# Patient Record
Sex: Male | Born: 1950 | Race: White | Hispanic: No | Marital: Single | Smoking: Former smoker
Health system: Southern US, Community
[De-identification: ages and names within clinical notes are randomized; demographics above are authoritative.]

## PROBLEM LIST (undated history)

## (undated) DIAGNOSIS — I219 Acute myocardial infarction, unspecified: Secondary | ICD-10-CM

## (undated) DIAGNOSIS — M51369 Other intervertebral disc degeneration, lumbar region without mention of lumbar back pain or lower extremity pain: Secondary | ICD-10-CM

## (undated) DIAGNOSIS — C801 Malignant (primary) neoplasm, unspecified: Secondary | ICD-10-CM

## (undated) DIAGNOSIS — M5126 Other intervertebral disc displacement, lumbar region: Secondary | ICD-10-CM

## (undated) DIAGNOSIS — M5136 Other intervertebral disc degeneration, lumbar region: Secondary | ICD-10-CM

## (undated) HISTORY — PX: CARDIAC CATHETERIZATION: SHX172

## (undated) HISTORY — DX: Other intervertebral disc degeneration, lumbar region: M51.36

## (undated) HISTORY — DX: Other intervertebral disc displacement, lumbar region: M51.26

## (undated) HISTORY — PX: KNEE SURGERY: SHX244

## (undated) HISTORY — DX: Other intervertebral disc degeneration, lumbar region without mention of lumbar back pain or lower extremity pain: M51.369

## (undated) NOTE — *Deleted (*Deleted)
[facility]  [address], [address] [place], [place] [address] Phone: [phone]  Fax: [phone]   Clinic Day:  05/02/2020   Referring physician: Marjie Skiff, NP  Chief Complaint: Jimmy West is a 73 y.o. male with metastatic adenocarcinoma of the lung who is seen for assessment prior to cycle #18 Alimta and pembrolizumab.   HPI: The patient was last seen in the medical oncology clinic on 04/11/2020. At that time, he felt "alright".  He wished to continue chemotherapy and pursue liquid biopsy testing.  Exam was stable. Hematocrit was 31.9, hemoglobin 9.8, MCV 93.0, platelets 453,000, WBC 11,500 (ANC 10,500). Calcium was 8.6. Albumin was 3.4. Creatinine was 1.24 (CrCl 59 ml/min). TSH was 0.690. Magnesium was 2.0. He received cycle #18 Alimta and pembrolizumab and Fulphila on 04/12/2020.  Liquid biopsy revealed no actionable mutation.  The tumor mutational burden was 1 Muts/Mb.  MSI high was not detected.  CHEK2 G 69*,  KRAS G12D, TP53 G245S.  He was contacted regarding these results.  Decision was made to pursue tissue by bronchoscopy or CT guidance.  He declined biopsy and wished to pursue CT guided biopsy.  CT guided biopsy is scheduled for x/x/2021.  During the interim, ***   Past Medical History:  Diagnosis Date  . Bulging lumbar disc   . Cancer (HCC)    stage 4 lung cancer  . Heart attack [facility])     Past Surgical History:  Procedure Laterality Date  . CARDIAC CATHETERIZATION     two stents  . KNEE SURGERY Left   . PORTA CATH INSERTION N/A 05/21/2018   Procedure: PORTA CATH INSERTION;  Surgeon: Annice Needy, MD;  Location: [facility] INVASIVE CV LAB;  Service: Cardiovascular;  Laterality: N/A;    Family History  Problem Relation Age of Onset  . Heart failure Father   . Cancer Maternal Aunt   . Heart failure Maternal Uncle   . Dementia Paternal Grandmother   . Cancer Maternal Aunt     Social History:  reports that he quit smoking  about 16 years ago. His smoking use included cigarettes. He has a 22.50 pack-year smoking history. He has never used smokeless tobacco. He reports previous alcohol use. He reports that he does not use drugs. He started smoking at age 45. He is smoking 1 1/2 packs/day. Patient denies known exposures to radiation ortoxins. 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 He has not been working recently as the salon was closed. He plays golf occasionally (none recently).He has 8 cats. The patient is alone ***today.   Allergies: No Known Allergies  Current Medications: Current Outpatient Medications  Medication Sig Dispense Refill  . acetaminophen (TYLENOL) 500 MG tablet Take 500 mg by mouth 3 (three) times daily as needed.  (Patient not taking: Reported on 04/11/2020)    . albuterol (VENTOLIN HFA) 108 (90 Base) MCG/ACT inhaler Inhale 2 puffs into the lungs every 6 (six) hours as needed for wheezing or shortness of breath. (Patient not taking: Reported on 04/11/2020) 18 g 3  . ALPRAZolam (XANAX) 0.5 MG tablet Take 1 tablet (0.5 mg total) by mouth 4 (four) times daily as needed for anxiety. 60 tablet 0  . Calcium 600-400 MG-UNIT CHEW Chew 2 tablets by mouth daily.    Marland Kitchen dexamethasone (DECADRON) 4 MG tablet Take 1 tab two times a day the day before Alimta chemo, then take 2 tabs once a day for 3 days starting the day after chemo.  30 tablet 1  . ELIQUIS 5 MG TABS tablet TAKE 1 TABLET BY MOUTH TWICE DAILY 60 tablet 5  . FLUoxetine (PROZAC) 40 MG capsule Take 1 capsule (40 mg total) by mouth daily. 30 capsule 3  . folic acid (FOLVITE) 1 MG tablet Take 1 tablet (1 mg total) by mouth daily. Continue until 21 days after Alimta completed. 100 tablet 3  . ondansetron (ZOFRAN-ODT) 8 MG disintegrating tablet DISSOLVE 1 TABLET ON TONGUE EVERY 8 HOURS AS NEEDED FOR NAUSEA OR VOMITING 20 tablet 1  . Oxycodone HCl 20 MG TABS TAKE 1/2 TO 1 TABLET BY MOUTH EVERY 4 HOURS AS NEEDED FOR SEVERE PAIN 60  tablet 0  . polyethylene glycol (MIRALAX / GLYCOLAX) packet Take 17 g by mouth daily.    Marland Kitchen senna (SENOKOT) 8.6 MG tablet Take 1 tablet by mouth as needed.      No current facility-administered medications for this visit.   Facility-Administered Medications Ordered in Other Visits  Medication Dose Route Frequency Provider Last Rate Last Admin  . 0.9 %  sodium chloride infusion   Intravenous Once Corcoran, Melissa C, MD      . 0.9 %  sodium chloride infusion   Intravenous Once PRN Corcoran, Melissa C, MD      . albuterol (PROVENTIL) (2.5 MG/3ML) 0.083% nebulizer solution 2.5 mg  2.5 mg Nebulization Once PRN Corcoran, Melissa C, MD      . alteplase (CATHFLO ACTIVASE) injection 2 mg  2 mg Intracatheter Once PRN Rosey Bath, MD      . EPINEPHrine (ADRENALIN) 1 MG/10ML injection 0.25 mg  0.25 mg Intravenous Once PRN Corcoran, Melissa C, MD      . EPINEPHrine (ADRENALIN) 1 MG/10ML injection 0.25 mg  0.25 mg Intravenous Once PRN Corcoran, Melissa C, MD      . heparin lock flush 100 unit/mL  500 Units Intracatheter Once PRN Merlene Pulling, Melissa C, MD      . heparin lock flush 100 unit/mL  250 Units Intracatheter Once PRN Nelva Nay C, MD      . sodium chloride flush (NS) 0.9 % injection 10 mL  10 mL Intravenous PRN Nelva Nay C, MD   10 mL at 03/04/19 0959  . sodium chloride flush (NS) 0.9 % injection 10 mL  10 mL Intracatheter Once PRN Corcoran, Melissa C, MD      . sodium chloride flush (NS) 0.9 % injection 3 mL  3 mL Intracatheter Once PRN Rosey Bath, MD        Review of Systems  Constitutional: Positive for weight loss (4 lbs). Negative for chills, diaphoresis, fever and malaise/fatigue.       Feels "alright".  HENT: Negative.  Negative for congestion, ear discharge, ear pain, hearing loss, nosebleeds, sinus pain, sore throat and tinnitus.   Eyes: Negative.  Negative for blurred vision and double vision.  Respiratory: Positive for cough and shortness of breath (on  exertion). Negative for hemoptysis and sputum production.   Cardiovascular: Positive for chest pain (when he takes deep breaths, occasional). Negative for palpitations, orthopnea and leg swelling.  Gastrointestinal: Negative for abdominal pain, blood in stool, constipation, diarrhea, heartburn, melena, nausea and vomiting.       Snacking more.  Genitourinary: Negative for dysuria, frequency and urgency.  Musculoskeletal: Positive for back pain (mild). Negative for falls, joint pain, myalgias and neck pain.  Skin: Negative.  Negative for itching and rash.  Neurological: Negative for dizziness, tingling, sensory change, focal weakness, weakness and headaches.  Endo/Heme/Allergies: Positive for environmental allergies (sinus drainage). Negative for polydipsia. Does not bruise/bleed easily.  Psychiatric/Behavioral: Positive for depression. Negative for memory loss. The patient is nervous/anxious (on Xanax). The patient does not have insomnia (on OTC medication).   All other systems reviewed and are negative.  Performance status (ECOG): 1***  Vitals There were no vitals taken for this visit.   Physical Exam Vitals and nursing note reviewed.  Constitutional:      General: He is not in acute distress.    Appearance: He is well-developed. He is not diaphoretic.     Interventions: Face mask in place.  HENT:     Head: Normocephalic and atraumatic.     Comments: Thin brown graying hair. Mustache.    Right Ear: Hearing normal.     Mouth/Throat:     Mouth: Mucous membranes are moist. No oral lesions.     Pharynx: Oropharynx is clear.     Comments: Dentures. Eyes:     General: No scleral icterus.    Extraocular Movements: Extraocular movements intact.     Conjunctiva/sclera: Conjunctivae normal.     Pupils: Pupils are equal, round, and reactive to light.     Comments: Glasses.  Gray/blue eyes.  Neck:     Vascular: No JVD.  Cardiovascular:     Rate and Rhythm: Normal rate and regular rhythm.      Heart sounds: Normal heart sounds. No murmur heard.  No friction rub. No gallop.   Pulmonary:     Effort: Pulmonary effort is normal. No respiratory distress.     Breath sounds: No wheezing or rhonchi.  Chest:     Chest wall: No tenderness.  Abdominal:     General: Bowel sounds are normal. There is no distension.     Palpations: Abdomen is soft. There is no hepatomegaly, splenomegaly or mass.     Tenderness: There is no abdominal tenderness. There is no guarding or rebound.  Musculoskeletal:        General: No swelling or tenderness. Normal range of motion.     Cervical back: Normal range of motion and neck supple.  Lymphadenopathy:     Head:     Right side of head: No preauricular, posterior auricular or occipital adenopathy.     Left side of head: No preauricular, posterior auricular or occipital adenopathy.     Cervical: No cervical adenopathy.     Upper Body:     Right upper body: No supraclavicular or axillary adenopathy.     Left upper body: No supraclavicular or axillary adenopathy.     Lower Body: No right inguinal adenopathy. No left inguinal adenopathy.  Skin:    General: Skin is warm and dry.     Coloration: Skin is not pale.     Findings: No bruising, erythema, lesion or rash.  Neurological:     Mental Status: He is alert and oriented to person, place, and time. Mental status is at baseline.  Psychiatric:        Behavior: Behavior normal.        Thought Content: Thought content normal.        Judgment: Judgment normal.    Imaging studies: 04/30/2018:  PET scanrevealed a 3.4 cm hypermetabolic RLL pulmonary mass (SUV 13.2), 11 mm pulmonary nodule in the LEFT lung (SUV 4.5), RIGHT paratracheal lymph node (SUV 5.1), and hypermetabolic activity within the T4 vertebral body (SUV 10.2).  05/03/2018:  Thoracic spine MRIon 05/03/2018 revealed T4 metastasiswith a 40% pathologic compression deformity and  5 mm of retropulsion of the vertebral body. Retropulsion results  in mild spinal canal stenosis and mild bilateral C4-5 foraminal stenosis. There was paravertebral soft tissue thickening from mid T3 to mid T5, likely representing edema related to the pathologic compression deformity vs. possible extraosseous extension of the neoplasm. There were no additional thoracic spinal metastases noted.  05/03/2018:  Head MRIrevealed no intracranial metastatic disease. 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 Incidental mention made of mild paranasal sinus disease. 01/10/2019:  Cervical and thoracic spine CT at [facility] unchanged severe compression deformity/vertebral plana of T4 with a stable degree of retropulsion and focal mild spinal canal stenosis. There was interval enlargement of a right lower lobe pulmonary mass(4.5 x 3.5 cm compared to 3.9 x 2.9 cm)with redemonstrated spiculated pulmonary nodules. 01/28/2019:  Chest, abdomen, pelvisCTrevealed slight interval enlargement of a right lower lobe mass with central necrosis measuring 4.6 x 3.4 cm, previously 4.0 x 3.0 cm. There was no change in right upper lobe nodules(1.4 and 0.8 cm).Therewas almost no residua of left lung nodules, with irregular opacities in the apical left upper lobeandsuperior segment left lower lobe. There was no change in right hilar soft tissue and lymph nodes.There wasvertebra plana deformity of T4andno evidence of new osseous metastatic disease.There was no evidence of distant metastatic disease in the abdomen or pelvis.The1.6 x 1.1 cmleft adrenal nodule(non-metabolic on prior PET scan),was unchanged. 03/24/2019:  Renal ultrasoundrevealed no acute abnormality identified.There was no hydronephrosis or bladder distention. 04/23/2019:  Abdomen and pelvis CT revealed no acute abdominal or pelvic pathology.  There was fluid in the colon which can be seen with diarrhea. There was diverticulosis without evidence of diverticulitis. There was a stable 1.5 cm left adrenal nodule. 05/06/2019:  Chest, abdomen, and pelvisCTrevealed a positive response to interval therapy for the known right lower lobe lung carcinoma(4.6 x 3.4 cm to3.1 x 2.8 cm transversely). There hadbeen a mild interval decrease contiguous soft tissue that extends along the right hilum. The two right upper lobe noduleswerestable from the most recent prior study (smaller than 04/27/2018).There are no new lung nodules.Therewasno evidence of new metastatic disease.There was stable severe compression deformity/vertebra plana of T4.Therewasno evidence of other osseous metastatic disease.There was a stable left adrenal nodule consistent with an adenoma. 05/30/2019:  ChestCT angiogramrevealed a tiny subsegmental pulmonary embolusin the right lower lobe. There areas of ground-glass at the periphery of the right chest hadbecome more conspicuous. This mayrelate to mild pneumonitis, perhaps due to radiation, attention on follow-up.The dominant nodule in the right lung basewasstable in size. Heis onEliquis. 08/14/2019:  Cervical spine MRIrevealed no evidence of metastatic disease within the cervical spine. 09/16/2019:  Chest, abdomen and pelvis CTrevealed asimilar-appearing spiculated right lower lobe nodule.There was no evidence for metastatic disease in the abdomen or pelvis.There was stable left adrenal adenoma. 12/15/2019:  Chest CT revealed an enlarging right lower lobe mass c/w malignancy (3.0 x 2.4 cm to 3.3 x 2.7 cm).  There was slight enlargement in a spiculated right upper lobe subpleural nodule (0.8 x 1.5 cm to 1.2 x 0.7 cm). 01/16/2020:  Bone scan revealed no scintigraphic evidence of osseous metastatic disease. 03/21/2020:  Chest CT angiogram revealed no evidence of pulmonary embolism.  A rounded spiculated mass in  the RUL measuring 3.4 x 3 cm which was slightly enlarged with adjacent pleural thickening.  There was a stable 1.4 x  0.6 cm irregular subpleural density in the RUL.  There was stable severe compression deformity in the upper thoracic vertebral body consistent with an old fracture.   No visits with results within 3 Day(s) from this visit.  Latest known visit with results is:  Infusion on 04/11/2020  Component Date Value Ref Range Status  . TSH 04/11/2020 0.690  0.350 - 4.500 uIU/mL Final   Comment: Performed by a 3rd Generation assay with a functional sensitivity of <=0.01 uIU/mL. Performed at [facility], [address], [place], [place] [address]   . Magnesium 04/11/2020 2.0  1.7 - 2.4 mg/dL Final   Performed at [facility], [address] [place], [place] [address]  . Sodium 04/11/2020 137  135 - 145 mmol/L Final  . Potassium 04/11/2020 4.3  3.5 - 5.1 mmol/L Final  . Chloride 04/11/2020 104  98 - 111 mmol/L Final  . CO2 04/11/2020 22  22 - 32 mmol/L Final  . Glucose, Bld 04/11/2020 169* 70 - 99 mg/dL Final   Glucose reference range applies only to samples taken after fasting for at least 8 hours.  . BUN 04/11/2020 9  8 - 23 mg/dL Final  . Creatinine, Ser 04/11/2020 1.24  0.61 - 1.24 mg/dL Final  . Calcium 09/81/1914 8.6* 8.9 - 10.3 mg/dL Final  . Total Protein 04/11/2020 7.0  6.5 - 8.1 g/dL Final  . Albumin 78/29/5621 3.4* 3.5 - 5.0 g/dL Final  . AST 30/86/5784 25  15 - 41 U/L Final  . ALT 04/11/2020 11  0 - 44 U/L Final  . Alkaline Phosphatase 04/11/2020 86  38 - 126 U/L Final  . Total Bilirubin 04/11/2020 0.5  0.3 - 1.2 mg/dL Final  . GFR calc non Af Amer 04/11/2020 59* >60 mL/min Final  . GFR calc Af Amer 04/11/2020 >60  >60 mL/min Final  . Anion gap 04/11/2020 11  5 - 15 Final   Performed at [facility], [address] [place], [place] [address]  . WBC 04/11/2020 11.5* 4.0 - 10.5 K/uL Final  . RBC 04/11/2020 3.43* 4.22 - 5.81 MIL/uL  Final  . Hemoglobin 04/11/2020 9.8* 13.0 - 17.0 g/dL Final  . HCT 52/84/1324 31.9* 39 - 52 % Final  . MCV 04/11/2020 93.0  80.0 - 100.0 fL Final  . MCH 04/11/2020 28.6  26.0 - 34.0 pg Final  . MCHC 04/11/2020 30.7  30.0 - 36.0 g/dL Final  . RDW 40/04/2724 15.9* 11.5 - 15.5 % Final  . Platelets 04/11/2020 453* 150 - 400 K/uL Final  . nRBC 04/11/2020 0.0  0.0 - 0.2 % Final  . Neutrophils Relative % 04/11/2020 92  % Final  . Neutro Abs 04/11/2020 10.5* 1.7 - 7.7 K/uL Final  . Lymphocytes Relative 04/11/2020 4  % Final  . Lymphs Abs 04/11/2020 0.5* 0.7 - 4.0 K/uL Final  . Monocytes Relative 04/11/2020 2  % Final  . Monocytes Absolute 04/11/2020 0.3  0.1 - 1.0 K/uL Final  . Eosinophils Relative 04/11/2020 0  % Final  . Eosinophils Absolute 04/11/2020 0.0  0 - 0 K/uL Final  . Basophils Relative 04/11/2020 0  % Final  . Basophils Absolute 04/11/2020 0.0  0 - 0 K/uL Final  . Immature Granulocytes 04/11/2020 2  % Final  . Abs Immature Granulocytes 04/11/2020 0.19* 0.00 - 0.07 K/uL Final   Performed at [facility], [address] [place], [place] [address]    Assessment:  Jimmy West is a  47 y.o. male with metastatichigh-grade adenocarcinomaof the right lungs/p CT-guided biopsy of a RLL lung mass on 05/11/2018. Pathologyrevealed an invasive high-grade adenocarcinoma, with predominantly solid growth pattern. The neoplastic cells were TTF-1 (+), Napsin A (+), and P40 (+). He has a T4 vertebral metastasis. Clinical stage is T4N1M1.  There was not enough material for [facility] One testing. PDL-1revealed TPS 90%.  PET scanon 04/30/2018 revealed a 3.4 cm hypermetabolic RLL pulmonary mass (SUV 13.2), 11 mm pulmonary nodule in the LEFT lung (SUV 4.5), RIGHT paratracheal lymph node (SUV 5.1), and hypermetabolic activity within the T4 vertebral body (SUV 10.2).   Thoracic spine MRIon 05/03/2018 revealed T4 metastasiswith a 40% pathologic compression deformity and 5  mm of retropulsion of the vertebral body. Retropulsion results in mild spinal canal stenosis and mild bilateral C4-5 foraminal stenosis. There was paravertebral soft tissue thickening from mid T3 to mid T5, likely representing edema related to the pathologic compression deformity vs. possible extraosseous extension of the neoplasm. There were no additional thoracic spinal metastases noted.   Head MRIon 05/03/2018 revealed no intracranial metastatic disease. 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 Incidental mention made of mild paranasal sinus disease.  Hereceived 11cycles ofpembrolizumab(05/24/2018 - 02/07/2019). He toleratedtreatment well. CEAwas 1.3 on 08/30/2018.LDHwas 165 on 11/29/2018.  He completed T4 radiationon 07/07/2018. He receives Xgevamonthly (06/03/2018 -03/12/2020).  He received 1 cycle of carboplatin, Alimta, and pembrolizumabon 02/08/2019. Cycle #1 was complicated by nausea, vomiting, and diarrhea necessitating hospitalization.Decision made to hold carboplatin with his second dose.He iss/p2cycles ofAlimta and pembrolizumab (02/28/2019 - 03/29/2019).Cycle #2 was complicated by nausea, vomiting, and dehydration requring fluids in clinic and an overnight stay in the hospital. Hereceivedcarboplatin (AUC 2), Alimta, and pebrolizumab(04/18/2019).He tolerated it poorly.   Hehasreceived17 cycles ofAlimta and pembrolizumabon (05/09/2019 -03/22/2020). He began Fulphila with cycle #14 secondary to neutropenia.  He has tolerated chemotherapy well. He receives B12every 9 weeks (last09/07/2019).  He hascancer-related painin T4. He is off the [employer].  He is taking oxycodone10-20mg  every 4 hours prn.  Chest, abdomen and pelvis CTon 09/16/2019 revealed  asimilar-appearing spiculated right lower lobe nodule.There was no evidence for metastatic disease in the abdomen or pelvis.There was stable left adrenal adenoma.  Chest CT on 12/15/2019 revealed an enlarging right lower lobe mass c/w malignancy (3.0 x 2.4 cm to 3.3 x 2.7 cm).  There was slight enlargement in a spiculated right upper lobe subpleural nodule (0.8 x 1.5 cm to 1.2 x 0.7 cm).  Bone scan on 01/16/2020 revealed no scintigraphic evidence of osseous metastatic disease.  Chest CT angiogram on 03/21/2020 revealed no evidence of pulmonary embolism.  A rounded spiculated 3.4 x 3 cm mass in the RUL was slightly enlarged with adjacent pleural thickening.  There was a stable 1.4 x 0.6 cm irregular subpleural density in the RUL.  There was stable severe compression deformity in the upper thoracic vertebral body consistent with an old fracture.  He has cervicalgia.Cervical spine MRIon 08/14/2019 showed no evidence of metastatic disease within the cervical spine. Heis followedby Dr Myer Haff.  He has chemotherapy induced anemia.  He received Retacrit on 11/01/2019 (last 04/09/2020).  Hehad transient renal insufficiencyon 03/23/2019. Creatinine was 1.32 (baseline 0.61-1.0). Renal ultrasoundon 03/24/2019 revealed no acute abnormality identified.There was no hydronephrosis or bladder distention.Creatinine is 0.82 today.  Stool waspositive for C difficile + diarrheaon 06/14/2019.Hewas treated with oral vancomycin.  He received the  influenza vaccineon 05/16/2019.   Symptomatically, ***  Plan: 1.   Labs: CBC with diff, CMP, Mg, TSH   2. Metastatic high-grade adenocarcinoma the RIGHT lung He is s/p 11 cycles of pembrolizumab. He is s/p 1 cycle of carboplatin, Almita, and pembrolizumab (02/08/2019). He is s/p 1 cycle of carboplatin (AUC 2), Alimta, and pembrolizumab (04/18/2019). He is s/p17cycles of Alimta and pembrolizumab (02/28/2019 - 03/29/2019;  05/09/2019-03/22/2020). Chest CT angiogram on 09/01/2021revealed slight growth in the RUL mass.  Discuss patient's thoughts about treatment.  He wishes to continue current therapy.   He declines change in therapy to Taxotere.   Obtain liquid biopsy today.     Labs reviewed.  Begin cycle #18 Alimta and pembrolizumab.  Discuss symptom management.  He has antiemetics and pain medications at home to use on a prn bases.  Interventions are adequate.       3. Bone metastasis Symptomatically, bone pain is mild.  Bone scan on 01/16/2020 revealed no evidence of metastatic disease.  He last received Xgeva on 03/12/2020.  Continue monthly Xgeva. 4.Anxiety and depression Clinically, he continues to have anxiety and depression.   He is on low-dose Xanax as well as Prozac.  He canceled his appointment with psychiatry.  Psychiatrist: Dr. Judith Blonder [phone]).  Patient is followed by Erlene Quan, NP (see note above).  Encourage patient to contact psychiatry. 5.Pulmonary embolism He remains on Eliquis.  He denies any bleeding.  Maintain platelets > 50,000. 6.Chemotherapy induced anemia Hematocrit31.9. Hemoglobin9.8. MCV 93.0today.  [phone] an iron saturation of 18% and a TIBC of312on 01/11/2020. Patient last received Retacrit on 04/09/2020.  Continue Retacrit per protocol 7.   Liquid biopsy today (before chemo). 8.   Cycle #18 Alimta and pembrolizumab. 9.   RTC tomorrow for Fulphila 10.   RTC in 3 weeks for MD assessment, labs (CBC with diff, CMP, Mg, TSH), Retacrit, and +/- Alimta and pembrolizumab.  I discussed the assessment and treatment plan with the patient.  The patient was provided an opportunity to ask questions and all were answered.  The patient agreed with the plan and demonstrated an understanding of the instructions.  The patient was advised to call back if the symptoms worsen or if  the condition fails to improve as anticipated.  I provided *** minutes of face-to-face time during this this encounter and > 50% was spent counseling as documented under my assessment and plan.   Rosey Bath, MD, PhD 05/02/2020, 3:59 AM   I, Danella Penton Tufford, am acting as a Neurosurgeon for [employer]. Merlene Pulling, MD.   I, Melissa C. Merlene Pulling, MD, have reviewed the above documentation for accuracy and completeness, and I agree with the above.

---

## 2003-11-03 ENCOUNTER — Other Ambulatory Visit: Payer: Self-pay

## 2003-11-05 ENCOUNTER — Other Ambulatory Visit: Payer: Self-pay

## 2003-11-06 ENCOUNTER — Other Ambulatory Visit: Payer: Self-pay

## 2003-11-07 ENCOUNTER — Other Ambulatory Visit: Payer: Self-pay

## 2003-11-08 ENCOUNTER — Other Ambulatory Visit: Payer: Self-pay

## 2014-04-06 ENCOUNTER — Emergency Department: Payer: Self-pay | Admitting: Emergency Medicine

## 2014-04-06 LAB — CBC WITH DIFFERENTIAL/PLATELET
BASOS ABS: 0.1 10*3/uL (ref 0.0–0.1)
Basophil %: 1 %
Eosinophil #: 0.1 10*3/uL (ref 0.0–0.7)
Eosinophil %: 1.7 %
HCT: 44.8 % (ref 40.0–52.0)
HGB: 14.9 g/dL (ref 13.0–18.0)
Lymphocyte #: 1.1 10*3/uL (ref 1.0–3.6)
Lymphocyte %: 18.4 %
MCH: 31.8 pg (ref 26.0–34.0)
MCHC: 33.4 g/dL (ref 32.0–36.0)
MCV: 95 fL (ref 80–100)
Monocyte #: 0.7 x10 3/mm (ref 0.2–1.0)
Monocyte %: 12.7 %
NEUTROS ABS: 3.9 10*3/uL (ref 1.4–6.5)
NEUTROS PCT: 66.2 %
Platelet: 238 10*3/uL (ref 150–440)
RBC: 4.7 10*6/uL (ref 4.40–5.90)
RDW: 12.8 % (ref 11.5–14.5)
WBC: 5.8 10*3/uL (ref 3.8–10.6)

## 2014-04-06 LAB — COMPREHENSIVE METABOLIC PANEL
ALBUMIN: 3.8 g/dL (ref 3.4–5.0)
ALK PHOS: 85 U/L
ANION GAP: 7 (ref 7–16)
BUN: 12 mg/dL (ref 7–18)
Bilirubin,Total: 0.8 mg/dL (ref 0.2–1.0)
CREATININE: 1.17 mg/dL (ref 0.60–1.30)
Calcium, Total: 8.9 mg/dL (ref 8.5–10.1)
Chloride: 108 mmol/L — ABNORMAL HIGH (ref 98–107)
Co2: 25 mmol/L (ref 21–32)
EGFR (African American): >60
EGFR (Non-African Amer.): >60
Glucose: 106 mg/dL — ABNORMAL HIGH (ref 65–99)
Osmolality: 280 (ref 275–301)
Potassium: 4 mmol/L (ref 3.5–5.1)
SGOT(AST): 24 U/L (ref 15–37)
SGPT (ALT): 33 U/L
SODIUM: 140 mmol/L (ref 136–145)
TOTAL PROTEIN: 7.3 g/dL (ref 6.4–8.2)

## 2014-04-06 LAB — TROPONIN I

## 2014-07-13 ENCOUNTER — Ambulatory Visit: Payer: Self-pay | Admitting: Family Medicine

## 2017-04-27 ENCOUNTER — Ambulatory Visit
Admission: EM | Admit: 2017-04-27 | Discharge: 2017-04-27 | Disposition: A | Discharge status: Home / Self Care | Payer: Medicare Other | Attending: Family Medicine | Admitting: Family Medicine

## 2017-04-27 ENCOUNTER — Encounter: Payer: Self-pay | Admitting: *Deleted

## 2017-04-27 DIAGNOSIS — M5442 Lumbago with sciatica, left side: Secondary | ICD-10-CM

## 2017-04-27 DIAGNOSIS — M545 Low back pain: Secondary | ICD-10-CM

## 2017-04-27 HISTORY — DX: Acute myocardial infarction, unspecified: I21.9

## 2017-04-27 MED ORDER — METHYLPREDNISOLONE SODIUM SUCC 125 MG IJ SOLR
80.0000 mg | Freq: Once | INTRAMUSCULAR | Status: AC
  Administered 2017-04-27: 80 mg via INTRAMUSCULAR

## 2017-04-27 MED ORDER — PREDNISONE 10 MG (21) PO TBPK: ORAL_TABLET | Freq: Every day | ORAL | 0 refills | Status: DC

## 2017-04-27 MED ORDER — CYCLOBENZAPRINE HCL 10 MG PO TABS: 10.0000 mg | ORAL_TABLET | Freq: Three times a day (TID) | ORAL | 0 refills | Status: DC | PRN

## 2017-04-27 MED ORDER — TRAMADOL HCL 50 MG PO TABS: 50.0000 mg | ORAL_TABLET | Freq: Four times a day (QID) | ORAL | 0 refills | Status: DC | PRN

## 2017-04-27 NOTE — Discharge Instructions (Signed)
Please get yourself a primary doctor.  Start the prednisone tomorrow.  Take care  Dr. Lacinda Axon

## 2017-04-27 NOTE — ED Provider Notes (Signed)
[facility]    CSN: [id number] Arrival date & time: 04/27/17  1650  History   Chief Complaint Chief Complaint  Patient presents with  . Back Pain   HPI  66 year old male with CAD status post PCI presents with complaints of low back pain.  Patient reports that he's had low back pain for the past week. Worse since Friday. Was worsened after lifting a client at his job. 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 He reports severe pain in the left low back which radiates down the left leg. Stabbing in character. Worse with range of motion. No known relieving factors. No incontinence or saddle anesthesia. No other associated symptoms. No other complaints or concerns at this time.  Past Medical History:  Diagnosis Date  . Heart attack [facility])    Past Surgical History:  Procedure Laterality Date  . CARDIAC CATHETERIZATION    . KNEE SURGERY Left   PCI  Home Medications    Prior to Admission medications   Medication Sig Start Date End Date Taking? Authorizing Provider  aspirin EC 81 MG tablet Take 81 mg by mouth daily.   Yes [provider]  cyclobenzaprine (FLEXERIL) 10 MG tablet Take 1 tablet (10 mg total) by mouth 3 (three) times daily as needed for muscle spasms. 04/27/17   Coral Spikes, DO  predniSONE (STERAPRED UNI-PAK 21 TAB) 10 MG (21) TBPK tablet Take by mouth daily. Take 6 tabs by mouth daily  for 2 days, then 5 tabs for 2 days, then 4 tabs for 2 days, then 3 tabs for 2 days, 2 tabs for 2 days, then 1 tab by mouth daily for 2 days 04/27/17   Coral Spikes, DO  traMADol (ULTRAM) 50 MG tablet Take 1 tablet (50 mg total) by mouth every 6 (six) hours as needed. 04/27/17   Coral Spikes, DO   Family History CAD - Father.  Social History Social History  Substance Use Topics  . Smoking status: Former Research scientist (life sciences)  . Smokeless tobacco: Never Used  . Alcohol use Yes   Allergies   Patient has no  known allergies.  Review of Systems Review of Systems  Musculoskeletal: Positive for back pain.       Decreased ROM.  All other systems reviewed and are negative.  Physical Exam Triage Vital Signs ED Triage Vitals  Enc Vitals Group     BP 04/27/17 1704 (!) 145/90     Pulse Rate 04/27/17 1704 87     Resp 04/27/17 1704 16     Temp 04/27/17 1704 98.2 F (36.8 C)     Temp Source 04/27/17 1704 Oral     SpO2 04/27/17 1704 98 %     Weight 04/27/17 1705 160 lb (72.6 kg)     Height 04/27/17 1705 5\' 5"  (1.651 m)     Head Circumference --      Peak Flow --      Pain Score 04/27/17 1706 9     Pain Loc --      Pain Edu? --      Excl. in [place]? --    Updated Vital Signs BP (!) 145/90 (BP Location: Right Arm)   Pulse 87   Temp 98.2 F (36.8 C) (Oral)   Resp 16   Ht 5\' 5"  (1.651 m)   Wt 160 lb (72.6 kg)   SpO2 98%   BMI 26.63 kg/m  Physical Exam  Constitutional: He is oriented to person, place, and time. He appears well-developed. No distress.  HENT:  Head: Normocephalic and atraumatic.  Eyes: Conjunctivae are normal. No scleral icterus.  Neck: Normal range of motion.  Cardiovascular: Normal rate and regular rhythm.   No murmur heard. Pulmonary/Chest: Effort normal and breath sounds normal. No respiratory distress. He has no wheezes. He has no rales.  Abdominal: Soft. He exhibits no distension. There is no tenderness. There is no rebound and no guarding.  Musculoskeletal:  Back Exam:  Inspection: Unremarkable  ROM: decreased in all planes secondary to pain. + Straight leg raise.  Neurological: He is alert and oriented to person, place, and time.  Skin: Skin is warm. No rash noted.  Psychiatric: He has a normal mood and affect.  Vitals reviewed.  UC Treatments / Results  Labs (all labs ordered are listed, but only abnormal results are displayed) Labs Reviewed - No data to display  EKG  EKG Interpretation None      Radiology No results  found.  Procedures Procedures (including critical care time)  Medications Ordered in UC Medications  methylPREDNISolone sodium succinate (SOLU-MEDROL) 125 mg/2 mL injection 80 mg (80 mg Intramuscular Given 04/27/17 1729)   Initial Impression / Assessment and Plan / UC Course  I have reviewed the triage vital signs and the nursing notes.  Pertinent labs & imaging results that were available during my care of the patient were reviewed by me and considered in my medical decision making (see chart for details).    66 year old male presents with low back pain. Severe. Treating with prednisone, Flexeril, and tramadol. IM injection of steroid given today.  Final Clinical Impressions(s) / UC Diagnoses   Final diagnoses:  Acute left-sided low back pain with left-sided sciatica   New Prescriptions New Prescriptions   CYCLOBENZAPRINE (FLEXERIL) 10 MG TABLET    Take 1 tablet (10 mg total) by mouth 3 (three) times daily as needed for muscle spasms.   PREDNISONE (STERAPRED UNI-PAK 21 TAB) 10 MG (21) TBPK TABLET    Take by mouth daily. Take 6 tabs by mouth daily  for 2 days, then 5 tabs for 2 days, then 4 tabs for 2 days, then 3 tabs for 2 days, 2 tabs for 2 days, then 1 tab by mouth daily for 2 days   TRAMADOL (ULTRAM) 50 MG TABLET    Take 1 tablet (50 mg total) by mouth every 6 (six) hours as needed.   Controlled Substance Prescriptions [place] Controlled Substance Registry consulted? Not Applicable   Coral Spikes, DO 04/27/17 1740

## 2017-04-27 NOTE — ED Triage Notes (Signed)
Patient started having lower back pain that radiates down his left leg 1 week ago.

## 2017-05-04 ENCOUNTER — Telehealth: Payer: Self-pay | Admitting: Emergency Medicine

## 2017-05-04 ENCOUNTER — Ambulatory Visit
Admission: EM | Admit: 2017-05-04 | Discharge: 2017-05-04 | Disposition: A | Discharge status: Home / Self Care | Payer: Medicare Other | Attending: Family Medicine | Admitting: Family Medicine

## 2017-05-04 DIAGNOSIS — M5432 Sciatica, left side: Secondary | ICD-10-CM | POA: Diagnosis not present

## 2017-05-04 MED ORDER — TRAMADOL HCL 50 MG PO TABS: ORAL_TABLET | ORAL | 0 refills | Status: DC

## 2017-05-04 MED ORDER — CYCLOBENZAPRINE HCL 10 MG PO TABS: 10.0000 mg | ORAL_TABLET | Freq: Every day | ORAL | 0 refills | Status: DC

## 2017-05-04 NOTE — ED Triage Notes (Signed)
Patient complains of left leg pain that starts at base of buttocks and radiates down. Patient states that pain has worsened since visit last week and he is now experiencing numbness in the leg. Patient states that numbness started on Tuesday.

## 2017-05-04 NOTE — ED Provider Notes (Signed)
[facility]    CSN: [id number] Arrival date & time: 05/04/17  1009     History   Chief Complaint Chief Complaint  Patient presents with  . Leg Pain    HPI Jimmy West is a 66 y.o. male.   66 yo male with a h/o sciatica, seen last week with left sided sciatica, presents today with a c/o minimal improvement. 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 States symptoms are better when he lays down. Denies any saddle anesthesia, bowel or bladder problems. Pain radiates down the back of the leg with intermittent numbness/tingling.    The history is provided by the patient.  Leg Pain    Past Medical History:  Diagnosis Date  . Heart attack ([place])     There are no active problems to display for this patient.   Past Surgical History:  Procedure Laterality Date  . CARDIAC CATHETERIZATION    . KNEE SURGERY Left        Home Medications    Prior to Admission medications   Medication Sig Start Date End Date Taking? Authorizing Provider  aspirin EC 81 MG tablet Take 81 mg by mouth daily.   Yes [provider]  predniSONE (STERAPRED UNI-PAK 21 TAB) 10 MG (21) TBPK tablet Take by mouth daily. Take 6 tabs by mouth daily  for 2 days, then 5 tabs for 2 days, then 4 tabs for 2 days, then 3 tabs for 2 days, 2 tabs for 2 days, then 1 tab by mouth daily for 2 days 04/27/17  Yes Cook, Jayce G, DO  cyclobenzaprine (FLEXERIL) 10 MG tablet Take 1 tablet (10 mg total) by mouth at bedtime. 05/04/17   Norval Gable, MD  traMADol (ULTRAM) 50 MG tablet 1-2 tabs po q 8 hours prn 05/04/17   Norval Gable, MD    Family History History reviewed. No pertinent family history.  Social History Social History  Substance Use Topics  . Smoking status: Former Research scientist (life sciences)  . Smokeless tobacco: Never Used  . Alcohol use Yes     Comment: occasionally     Allergies   Patient has no known allergies.   Review of Systems Review of Systems   Physical  Exam Triage Vital Signs ED Triage Vitals  Enc Vitals Group     BP 05/04/17 1019 (!) 149/90     Pulse Rate 05/04/17 1019 89     Resp 05/04/17 1019 18     Temp 05/04/17 1019 98.1 F (36.7 C)     Temp Source 05/04/17 1019 Oral     SpO2 05/04/17 1019 99 %     Weight 05/04/17 1017 160 lb (72.6 kg)     Height 05/04/17 1017 5\' 5"  (1.651 m)     Head Circumference --      Peak Flow --      Pain Score 05/04/17 1017 9     Pain Loc --      Pain Edu? --      Excl. in [place]? --    No data found.   Updated Vital Signs BP (!) 149/90 (BP Location: Left Arm)   Pulse 89   Temp 98.1 F (36.7 C) (Oral)   Resp 18   Ht 5\' 5"  (1.651 m)   Wt 160 lb (72.6 kg)   SpO2 99%   BMI 26.63 kg/m   Visual Acuity Right Eye Distance:   Left Eye Distance:   Bilateral Distance:    Right Eye Near:  Left Eye Near:    Bilateral Near:     Physical Exam  Constitutional: He appears well-developed and well-nourished. No distress.  Neck: Normal range of motion. Neck supple. No tracheal deviation present.  Pulmonary/Chest: Effort normal. No stridor. No respiratory distress.  Musculoskeletal:       Lumbar back: He exhibits tenderness (over the left buttock) and spasm. He exhibits normal range of motion, no bony tenderness, no swelling, no edema, no deformity, no laceration, no pain and normal pulse.  Neurological: He is alert. He has normal reflexes. He displays normal reflexes. He exhibits normal muscle tone. Coordination normal.  Skin: No rash noted. He is not diaphoretic.  Nursing note and vitals reviewed.    UC Treatments / Results  Labs (all labs ordered are listed, but only abnormal results are displayed) Labs Reviewed - No data to display  EKG  EKG Interpretation None       Radiology No results found.  Procedures Procedures (including critical care time)  Medications Ordered in UC Medications - No data to display   Initial Impression / Assessment and Plan / UC Course  I have  reviewed the triage vital signs and the nursing notes.  Pertinent labs & imaging results that were available during my care of the patient were reviewed by me and considered in my medical decision making (see chart for details).       Final Clinical Impressions(s) / UC Diagnoses   Final diagnoses:  Sciatica of left side    New Prescriptions Discharge Medication List as of 05/04/2017 10:56 AM     1.diagnosis reviewed with patient 2. rx as per orders above; reviewed possible side effects, interactions, risks and benefits; refilled tramadol and flexeril 3. Recommend supportive treatment with ice and gentle stretching exercises 4. Follow-up with spine specialist if no improvement  Controlled Substance Prescriptions [place] Controlled Substance Registry consulted? no   Norval Gable, MD 05/04/17 [phone]

## 2017-05-04 NOTE — Telephone Encounter (Signed)
Patient called today stating that he was seen 1 week ago for low back pain and sciatica. Patient states he is getting worse even with taking Prednisone and Flexeril. Patient states he is about out of the pain medication Tramadol. I advised patient that he should follow up with his primary MD. Patient states he does not have a PCP. I advised he could return to Urgent Care for re-evaluation. Patient voiced understanding.

## 2017-05-13 DIAGNOSIS — M545 Low back pain: Secondary | ICD-10-CM | POA: Diagnosis not present

## 2017-05-15 ENCOUNTER — Other Ambulatory Visit: Payer: Self-pay | Admitting: Orthopedic Surgery

## 2017-05-15 DIAGNOSIS — M545 Low back pain: Secondary | ICD-10-CM

## 2017-05-21 ENCOUNTER — Ambulatory Visit
Admission: RE | Admit: 2017-05-21 | Discharge: 2017-05-21 | Disposition: A | Discharge status: Home / Self Care | Payer: Medicare Other | Source: Ambulatory Visit | Attending: Orthopedic Surgery | Admitting: Orthopedic Surgery

## 2017-05-21 DIAGNOSIS — M5127 Other intervertebral disc displacement, lumbosacral region: Secondary | ICD-10-CM | POA: Diagnosis not present

## 2017-05-21 DIAGNOSIS — M48061 Spinal stenosis, lumbar region without neurogenic claudication: Secondary | ICD-10-CM | POA: Diagnosis not present

## 2017-05-21 DIAGNOSIS — M5136 Other intervertebral disc degeneration, lumbar region: Secondary | ICD-10-CM | POA: Diagnosis not present

## 2017-05-21 DIAGNOSIS — M545 Low back pain: Secondary | ICD-10-CM | POA: Diagnosis not present

## 2017-06-02 DIAGNOSIS — M5416 Radiculopathy, lumbar region: Secondary | ICD-10-CM | POA: Diagnosis not present

## 2017-06-10 DIAGNOSIS — M5416 Radiculopathy, lumbar region: Secondary | ICD-10-CM | POA: Diagnosis not present

## 2017-06-24 DIAGNOSIS — M5416 Radiculopathy, lumbar region: Secondary | ICD-10-CM | POA: Diagnosis not present

## 2017-09-09 ENCOUNTER — Emergency Department: Payer: Medicare Other

## 2017-09-09 ENCOUNTER — Other Ambulatory Visit: Payer: Self-pay

## 2017-09-09 ENCOUNTER — Emergency Department
Admission: EM | Admit: 2017-09-09 | Discharge: 2017-09-09 | Disposition: A | Discharge status: Home / Self Care | Payer: Medicare Other | Attending: Emergency Medicine | Admitting: Emergency Medicine

## 2017-09-09 ENCOUNTER — Encounter: Payer: Self-pay | Admitting: *Deleted

## 2017-09-09 DIAGNOSIS — Y939 Activity, unspecified: Secondary | ICD-10-CM | POA: Diagnosis not present

## 2017-09-09 DIAGNOSIS — M545 Low back pain: Secondary | ICD-10-CM | POA: Insufficient documentation

## 2017-09-09 DIAGNOSIS — Y929 Unspecified place or not applicable: Secondary | ICD-10-CM | POA: Diagnosis not present

## 2017-09-09 DIAGNOSIS — Z7982 Long term (current) use of aspirin: Secondary | ICD-10-CM | POA: Diagnosis not present

## 2017-09-09 DIAGNOSIS — Y998 Other external cause status: Secondary | ICD-10-CM | POA: Insufficient documentation

## 2017-09-09 DIAGNOSIS — S2231XA Fracture of one rib, right side, initial encounter for closed fracture: Secondary | ICD-10-CM | POA: Insufficient documentation

## 2017-09-09 DIAGNOSIS — S299XXA Unspecified injury of thorax, initial encounter: Secondary | ICD-10-CM | POA: Diagnosis not present

## 2017-09-09 DIAGNOSIS — Z79899 Other long term (current) drug therapy: Secondary | ICD-10-CM | POA: Insufficient documentation

## 2017-09-09 DIAGNOSIS — W11XXXA Fall on and from ladder, initial encounter: Secondary | ICD-10-CM | POA: Insufficient documentation

## 2017-09-09 DIAGNOSIS — Z87891 Personal history of nicotine dependence: Secondary | ICD-10-CM | POA: Insufficient documentation

## 2017-09-09 DIAGNOSIS — S3992XA Unspecified injury of lower back, initial encounter: Secondary | ICD-10-CM | POA: Diagnosis not present

## 2017-09-09 DIAGNOSIS — R0781 Pleurodynia: Secondary | ICD-10-CM | POA: Diagnosis not present

## 2017-09-09 MED ORDER — OXYCODONE-ACETAMINOPHEN 5-325 MG PO TABS: 1.0000 | ORAL_TABLET | Freq: Three times a day (TID) | ORAL | 0 refills | Status: AC | PRN

## 2017-09-09 MED ORDER — OXYCODONE-ACETAMINOPHEN 5-325 MG PO TABS
1.0000 | ORAL_TABLET | Freq: Once | ORAL | Status: AC
  Administered 2017-09-09: 1 via ORAL
  Filled 2017-09-09: qty 1

## 2017-09-09 NOTE — ED Triage Notes (Signed)
Pt to ED reporting continued back pain after falling off a ladder on Monday. Pt reports he had been having right rib pain that was tolerable but today he coughed and had a sudden worsening of the pain in his right rib and right mid back. Movement increases pain per pt.

## 2017-09-09 NOTE — ED Notes (Addendum)
Pt having difficulty moving around room. Pain increase when pt twist waist, deep inhalation, and area just under ribs on right side of back tender to papulation. No bruising noted on right side or back. Pt concerned may be an injury from fall this past Monday.

## 2017-09-09 NOTE — ED Notes (Signed)
Pt. Verbalizes understanding of d/c instructions, medications, and follow-up. VS stable.  Pt. In NAD at time of d/c and denies further concerns regarding this visit. Pt. Stable at the time of departure from the unit, departing unit by the safest and most appropriate manner per that pt condition and limitations with all belongings accounted for. Pt advised to return to the ED at any time for emergent concerns, or for new/worsening symptoms.   

## 2017-09-09 NOTE — ED Provider Notes (Signed)
[facility] Emergency Department Provider Note  ____________________________________________  Time seen: Approximately 10:30 PM  I have reviewed the triage vital signs and the nursing notes.   HISTORY  Chief Complaint Back Pain    HPI Jimmy West is a 67 y.o. male presents to the emergency department after falling from a ladder 2 days ago.  Patient reported initially having right anterior rib pain.  Patient reported that he coughed suddenly and felt a sharp sudden pain in his low back patient denies radiculopathy, weakness or changes in sensation of the lower extremities no bowel or bladder incontinence or saddle anesthesia.  Patient ambulates with difficulty in slight flexion at the spine.  Pain is improved with rest in supine position.  Patient did not hit his head or lose consciousness.  No neck pain.   Past Medical History:  Diagnosis Date  . Heart attack ([place])     There are no active problems to display for this patient.   Past Surgical History:  Procedure Laterality Date  . CARDIAC CATHETERIZATION    . KNEE SURGERY Left     Prior to Admission medications   Medication Sig Start Date End Date Taking? Authorizing Provider  aspirin EC 81 MG tablet Take 81 mg by mouth daily.    [provider]  cyclobenzaprine (FLEXERIL) 10 MG tablet Take 1 tablet (10 mg total) by mouth at bedtime. 05/04/17   Norval Gable, MD  oxyCODONE-acetaminophen (PERCOCET/ROXICET) 5-325 MG tablet Take 1 tablet by mouth every 8 (eight) hours as needed for up to 3 days for severe pain. 09/09/17 09/12/17  Lannie Fields, PA-C  predniSONE (STERAPRED UNI-PAK 21 TAB) 10 MG (21) TBPK tablet Take by mouth daily. Take 6 tabs by mouth daily  for 2 days, then 5 tabs for 2 days, then 4 tabs for 2 days, then 3 tabs for 2 days, 2 tabs for 2 days, then 1 tab by mouth daily for 2 days 04/27/17   Coral Spikes, DO  traMADol Veatrice Bourbon) 50 MG tablet 1-2 tabs po q 8 hours prn 05/04/17    Norval Gable, MD    Allergies Patient has no known allergies.  History reviewed. No pertinent family history.  Social History Social History   Tobacco Use  . Smoking status: Former Research scientist (life sciences)  . Smokeless tobacco: Never Used  Substance Use Topics  . Alcohol use: Yes    Comment: occasionally  . Drug use: No     Review of Systems  Constitutional: No fever/chills Eyes: No visual changes. No discharge ENT: No upper respiratory complaints. Cardiovascular: no chest pain. Respiratory: no cough. No SOB. Musculoskeletal: Patient has right anterior rib pain. Patient has low back pain.  Skin: Negative for rash, abrasions, lacerations, ecchymosis. Neurological: Negative for headaches, focal weakness or numbness. ____________________________________________   PHYSICAL EXAM:  VITAL SIGNS: ED Triage Vitals  Enc Vitals Group     BP 09/09/17 2052 126/82     Pulse Rate 09/09/17 2052 97     Resp 09/09/17 2052 16     Temp 09/09/17 2052 98.1 F (36.7 C)     Temp Source 09/09/17 2052 Oral     SpO2 09/09/17 2052 96 %     Weight 09/09/17 2053 160 lb (72.6 kg)     Height 09/09/17 2053 5\' 6"  (1.676 m)     Head Circumference --      Peak Flow --      Pain Score 09/09/17 2053 10     Pain Loc --  Pain Edu? --      Excl. in [place]? --      Constitutional: Alert and oriented. Well appearing and in no acute distress. Eyes: Conjunctivae are normal. PERRL. EOMI. Head: Atraumatic. Cardiovascular: Normal rate, regular rhythm. Normal S1 and S2.  Good peripheral circulation. Respiratory: Normal respiratory effort without tachypnea or retractions. Lungs CTAB. Good air entry to the bases with no decreased or absent breath sounds. Gastrointestinal: Bowel sounds 4 quadrants. Soft and nontender to palpation. No guarding or rigidity. No palpable masses. No distention. No CVA tenderness. Musculoskeletal: Patient has tenderness to palpation over right anterior ribs.  Patient has reproducible midline  lumbar spinal tenderness. Neurologic:  Normal speech and language. No gross focal neurologic deficits are appreciated.  Skin:  Skin is warm, dry and intact. No rash noted. Psychiatric: Mood and affect are normal. Speech and behavior are normal. Patient exhibits appropriate insight and judgement.   ____________________________________________   LABS (all labs ordered are listed, but only abnormal results are displayed)  Labs Reviewed - No data to display ____________________________________________  EKG   ____________________________________________  RADIOLOGY Unk Pinto, personally viewed and evaluated these images (plain radiographs) as part of my medical decision making, as well as reviewing the written report by the radiologist.    Dg Ribs Unilateral W/chest Right  Result Date: 09/09/2017 CLINICAL DATA:  Continued right rib back pain after fall off ladder 2 days ago. Pain worsening after coughing today. EXAM: RIGHT RIBS AND CHEST - 3+ VIEW COMPARISON:  None. FINDINGS: Suspect nondisplaced fracture of right anterior seventh rib. No additional rib fracture. There is no evidence of pneumothorax or pleural effusion. Both lungs are clear. Heart size and mediastinal contours are within normal limits. IMPRESSION: Suspect nondisplaced right anterior seventh rib fracture. Electronically Signed   By: Jeb Levering M.D.   On: 09/09/2017 22:31   Dg Lumbar Spine 2-3 Views  Result Date: 09/09/2017 CLINICAL DATA:  Continued lumbosacral back pain after fall off ladder 2 days ago. Pain worsening after coughing today. EXAM: LUMBAR SPINE - 2-3 VIEW COMPARISON:  Lumbar spine MRI 05/21/2017 FINDINGS: No acute fracture. Alignment is unchanged. Minimal retrolisthesis of L2 on L3 and L3 on L4 is unchanged from prior MRI. Diffuse endplate spurring with disc space narrowing most prominent at L5-S1. Lower lumbar facet arthropathy. Posterior elements appear intact. Sacroiliac joints are congruent.  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 Electronically Signed   By: Jeb Levering M.D.   On: 09/09/2017 22:28    ____________________________________________    PROCEDURES  Procedure(s) performed:    Procedures    Medications  oxyCODONE-acetaminophen (PERCOCET/ROXICET) 5-325 MG per tablet 1 tablet (1 tablet Oral Given 09/09/17 2206)     ____________________________________________   INITIAL IMPRESSION / ASSESSMENT AND PLAN / ED COURSE  Pertinent labs & imaging results that were available during my care of the patient were reviewed by me and considered in my medical decision making (see chart for details).  Review of the [place] CSRS was performed in accordance of the [place] prior to dispensing any controlled drugs.     Assessment and Plan: Fall Differential diagnosis fracture, contusion and herniated disc.  Patient reported right anterior rib pain as well as low back pain.   x-ray examination was concerning for a nondisplaced seventh right anterior rib fracture.  Patient was given Percocet in the emergency department.  He was discharged with Percocet.  Vital signs are reassuring prior to discharge.  All patient questions were answered.  ____________________________________________  FINAL CLINICAL IMPRESSION(S) / ED DIAGNOSES  Final diagnoses:  Closed fracture of one rib of right side, initial encounter      NEW MEDICATIONS STARTED DURING THIS VISIT:  ED Discharge Orders        Ordered    oxyCODONE-acetaminophen (PERCOCET/ROXICET) 5-325 MG tablet  Every 8 hours PRN     09/09/17 2237          This chart was dictated using voice recognition software/Dragon. Despite best efforts to proofread, errors can occur which can change the meaning. Any change was purely unintentional.    Lannie Fields, PA-C 09/09/17 2352    Orbie Pyo, MD 09/10/17 [phone]

## 2018-04-14 ENCOUNTER — Ambulatory Visit
Admission: EM | Admit: 2018-04-14 | Discharge: 2018-04-14 | Disposition: A | Discharge status: Home / Self Care | Payer: Medicare Other | Attending: Internal Medicine | Admitting: Internal Medicine

## 2018-04-14 ENCOUNTER — Other Ambulatory Visit: Payer: Self-pay

## 2018-04-14 ENCOUNTER — Encounter: Payer: Self-pay | Admitting: Emergency Medicine

## 2018-04-14 ENCOUNTER — Ambulatory Visit: Payer: Medicare Other

## 2018-04-14 DIAGNOSIS — S233XXA Sprain of ligaments of thoracic spine, initial encounter: Secondary | ICD-10-CM | POA: Insufficient documentation

## 2018-04-14 DIAGNOSIS — S29012A Strain of muscle and tendon of back wall of thorax, initial encounter: Secondary | ICD-10-CM | POA: Diagnosis not present

## 2018-04-14 DIAGNOSIS — Z7982 Long term (current) use of aspirin: Secondary | ICD-10-CM | POA: Diagnosis not present

## 2018-04-14 DIAGNOSIS — R918 Other nonspecific abnormal finding of lung field: Secondary | ICD-10-CM | POA: Diagnosis not present

## 2018-04-14 DIAGNOSIS — I252 Old myocardial infarction: Secondary | ICD-10-CM | POA: Diagnosis not present

## 2018-04-14 DIAGNOSIS — X58XXXA Exposure to other specified factors, initial encounter: Secondary | ICD-10-CM | POA: Insufficient documentation

## 2018-04-14 DIAGNOSIS — Z87891 Personal history of nicotine dependence: Secondary | ICD-10-CM | POA: Diagnosis not present

## 2018-04-14 DIAGNOSIS — Z79899 Other long term (current) drug therapy: Secondary | ICD-10-CM | POA: Diagnosis not present

## 2018-04-14 DIAGNOSIS — M549 Dorsalgia, unspecified: Secondary | ICD-10-CM | POA: Diagnosis present

## 2018-04-14 DIAGNOSIS — J181 Lobar pneumonia, unspecified organism: Secondary | ICD-10-CM

## 2018-04-14 DIAGNOSIS — J189 Pneumonia, unspecified organism: Secondary | ICD-10-CM | POA: Diagnosis not present

## 2018-04-14 MED ORDER — LEVOFLOXACIN 500 MG PO TABS: 500.0000 mg | ORAL_TABLET | Freq: Every day | ORAL | 0 refills | Status: DC

## 2018-04-14 MED ORDER — PREDNISONE 10 MG PO TABS: 20.0000 mg | ORAL_TABLET | Freq: Every day | ORAL | 0 refills | Status: AC

## 2018-04-14 MED ORDER — CEFTRIAXONE SODIUM 1 G IJ SOLR
1.0000 g | Freq: Once | INTRAMUSCULAR | Status: AC
  Administered 2018-04-14: 1 g via INTRAMUSCULAR

## 2018-04-14 NOTE — ED Triage Notes (Signed)
Pt c/o back pain that wraps around under both sides of his ribs. Pain is worse when he takes a deep breath. He has had a dry cough. No known injury. Started about a week ago.

## 2018-04-14 NOTE — Discharge Instructions (Addendum)
Your chest xray shows 3 pulmonary nodules for which is recommended you have a chest CT Please follow up with your family Dr in the next week to check on your symptoms and get the chest cat scan ordered.  Start the Levaquin antibiotic tomorrow. Do not participate in any strenuous physical activity since Levaquin can cause tendon inflammation and rate tendon rupture.

## 2018-04-14 NOTE — ED Provider Notes (Signed)
[facility]    CSN: [id number] Arrival date & time: 04/14/18  1736     History   Chief Complaint Chief Complaint  Patient presents with  . Back Pain    HPI Jimmy West is a 67 y.o. male.   Onset of upper side ribs  Hurting 10 days ago and was mild, since then gradually seemed better over the weekend. Then he played gulf Monday and since then the pain came back and now radiates to mid thorax. Pain is provoked with deep breaths and thoracic movement. At times moves to opposite site at times, and side hurts more. Has taken Tylenol for pain, and has not tried ice or heat. Is not aware of having osteoporosis.      Past Medical History:  Diagnosis Date  . Heart attack ([place])     There are no active problems to display for this patient.   Past Surgical History:  Procedure Laterality Date  . CARDIAC CATHETERIZATION    . KNEE SURGERY Left        Home Medications    Prior to Admission medications   Medication Sig Start Date End Date Taking? Authorizing Provider  aspirin EC 81 MG tablet Take 81 mg by mouth daily.   Yes [provider]  cyclobenzaprine (FLEXERIL) 10 MG tablet Take 1 tablet (10 mg total) by mouth at bedtime. 05/04/17   Norval Gable, MD  levofloxacin (LEVAQUIN) 500 MG tablet Take 1 tablet (500 mg total) by mouth daily. 04/14/18   Rodriguez-Southworth, Sunday Spillers, PA-C  predniSONE (DELTASONE) 10 MG tablet Take 2 tablets (20 mg total) by mouth daily for 5 days. 04/14/18 04/19/18  Rodriguez-Southworth, Sunday Spillers, PA-C  traMADol Veatrice Bourbon) 50 MG tablet 1-2 tabs po q 8 hours prn 05/04/17   Norval Gable, MD    Family History Family History  Problem Relation Age of Onset  . Heart failure Father     Social History Social History   Tobacco Use  . Smoking status: Former Research scientist (life sciences)  . Smokeless tobacco: Never Used  Substance Use Topics  . Alcohol use: Yes    Comment: occasionally  . Drug use: No     Allergies   Patient has no known  allergies.   Review of Systems Review of Systems  Constitutional: Negative for activity change, chills, diaphoresis and fever.  HENT: Negative for congestion, postnasal drip and rhinorrhea.   Respiratory: Negative for cough, chest tightness, shortness of breath and wheezing.   Cardiovascular: Negative for chest pain.  Gastrointestinal: Negative for nausea.  Musculoskeletal: Positive for back pain.       Mid thoracic area. Also has lumbar disc rupture  Skin: Negative for rash and wound.  Hematological: Negative for adenopathy.     Physical Exam Triage Vital Signs ED Triage Vitals  Enc Vitals Group     BP 04/14/18 1750 (!) 156/84     Pulse Rate 04/14/18 1750 72     Resp 04/14/18 1750 16     Temp 04/14/18 1750 98 F (36.7 C)     Temp Source 04/14/18 1750 Oral     SpO2 04/14/18 1750 97 %     Weight 04/14/18 1749 155 lb (70.3 kg)     Height 04/14/18 1749 5\' 6"  (1.676 m)     Head Circumference --      Peak Flow --      Pain Score 04/14/18 1747 5     Pain Loc --      Pain Edu? --  Excl. in GC? --    No data found.  Updated Vital Signs BP (!) 156/84 (BP Location: Left Arm)   Pulse 72   Temp 98 F (36.7 C) (Oral)   Resp 16   Ht 5\' 6"  (1.676 m)   Wt 155 lb (70.3 kg)   SpO2 97%   BMI 25.02 kg/m   Visual Acuity Right Eye Distance:   Left Eye Distance:   Bilateral Distance:    Right Eye Near:   Left Eye Near:    Bilateral Near:     Physical Exam  Constitutional: He is oriented to person, place, and time. He appears well-developed and well-nourished.  HENT:  Head: Normocephalic.  Right Ear: External ear normal.  Left Ear: External ear normal.  Nose: Nose normal.  Eyes: Conjunctivae are normal.  Neck: Normal range of motion. Neck supple. No tracheal deviation present.  Cardiovascular: Normal rate, regular rhythm and normal heart sounds. Exam reveals no gallop and no friction rub.  No murmur heard. Pulmonary/Chest: Effort normal and breath sounds normal. No  stridor. No respiratory distress. He has no wheezes. He has no rales. He exhibits tenderness.  No egophony. Does not have any chest tenderness with palpation of rib region, no rashes noted.   Musculoskeletal: Normal range of motion.  Has mild tenderness of T4-T10 thoracic vertebrae and feels it more with L lateral flexion.   Neurological: He is alert and oriented to person, place, and time.  Skin: Skin is warm and dry. No rash noted. No erythema.     UC Treatments / Results  Labs (all labs ordered are listed, but only abnormal results are displayed) Labs Reviewed - No data to display  EKG None  Radiology Shows RIGHT middle lobe consolidation concerning for pneumonia. At least 2 new pulmonary nodules. Constellation of findings also seen with neoplasm. Recommend contrast enhanced CT chest.  Procedures Procedures (including critical care time)  Medications Ordered in UC Medications  cefTRIAXone (ROCEPHIN) injection 1 g (1 g Intramuscular Given 04/14/18 1915)    Initial Impression / Assessment and Plan / UC Course  I have reviewed the triage vital signs and the nursing notes.  Pertinent labs & imaging results that were available during my care of the patient were reviewed by me and considered in my medical decision making (see chart for details). He was placed on Levaquin 500 mg qd x 10 days. Needs to Fu with PCP within one week.      Final Clinical Impressions(s) / UC Diagnoses   Final diagnoses:  Repetitive strain injury of thoracic spine, initial encounter  Pneumonia of right middle lobe due to infectious organism ([place])  Abnormal chest x-ray with multiple lung nodules  Pulmonary nodules     Discharge Instructions     Your chest xray shows 3 pulmonary nodules for which is recommended you have a chest CT Please follow up with your family Dr in the next week to check on your symptoms and get the chest cat scan ordered.  Start the Levaquin antibiotic tomorrow. Do not  participate in any strenuous physical activity since Levaquin can cause tendon inflammation and rate tendon rupture.     ED Prescriptions    Medication Sig Dispense Auth. Provider   levofloxacin (LEVAQUIN) 500 MG tablet Take 1 tablet (500 mg total) by mouth daily. 7 tablet Rodriguez-Southworth, Sunday Spillers, PA-C   predniSONE (DELTASONE) 10 MG tablet Take 2 tablets (20 mg total) by mouth daily for 5 days. 10 tablet Rodriguez-Southworth, Sunday Spillers, [place]  Controlled Substance Prescriptions    Shelby Mattocks, Hershal Coria 04/14/18 1924

## 2018-04-26 ENCOUNTER — Telehealth: Payer: Self-pay

## 2018-04-26 ENCOUNTER — Encounter: Payer: Self-pay | Admitting: Nurse Practitioner

## 2018-04-26 ENCOUNTER — Telehealth: Payer: Self-pay | Admitting: Nurse Practitioner

## 2018-04-26 ENCOUNTER — Ambulatory Visit (INDEPENDENT_AMBULATORY_CARE_PROVIDER_SITE_OTHER): Payer: Medicare Other | Admitting: Nurse Practitioner

## 2018-04-26 VITALS — BP 138/80 | HR 90 | Temp 98.4°F | Ht 65.9 in | Wt 171.0 lb

## 2018-04-26 DIAGNOSIS — G8929 Other chronic pain: Secondary | ICD-10-CM

## 2018-04-26 DIAGNOSIS — M545 Low back pain, unspecified: Secondary | ICD-10-CM

## 2018-04-26 DIAGNOSIS — C3431 Malignant neoplasm of lower lobe, right bronchus or lung: Secondary | ICD-10-CM | POA: Insufficient documentation

## 2018-04-26 DIAGNOSIS — R911 Solitary pulmonary nodule: Secondary | ICD-10-CM

## 2018-04-26 NOTE — Telephone Encounter (Signed)
Copied from [place] [id number]. Topic: Referral - Request >> Apr 26, 2018 10:45 AM Burchel, Abbi R wrote: Reason for CRM:   Pt's wife requesting referral to lung specialist for pneumonia.  Pt is in a lot of pain. >> Apr 26, 2018 10:49 AM Guadalupe Maple, MD wrote: Can someone forward this back to [place] to provider.

## 2018-04-26 NOTE — Progress Notes (Signed)
BP 138/80 (BP Location: Left Arm, Patient Position: Sitting)   Pulse 90   Temp 98.4 F (36.9 C) (Oral)   Ht 5' 5.9" (1.674 m)   Wt 171 lb (77.6 kg)   SpO2 98%   BMI 27.68 kg/m    Subjective:    Patient ID: Jimmy West, male    DOB: [birth year], 67 y.o.   MRN: [id number]  HPI: Jimmy West is a 67 y.o. male here to establish care.  Complaint of back pain for 3 weeks and has become worse.  [place] off a ladder in February of this year and broke a rib.  Last year was diagnosed with a "ruptured disc and had a steroid epidural, through [facility]".  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  Pain radiates to ribs on both sides, to shoulders, and lower back.  Waxes and wanes in consistency.  Does not get relief "any way with this" and does not sleep well, laying down does not help it.  Back pain has been chronic, ongoing for over a year.  Most of the time he reports it is a 10/10 and then sometimes it is less 7-8/10.  Tried Tylenol and it "does nothing".  At home has attempted heat and ice.  Has not attempted Biofreeze.  Has taken Tylenol, Gabapentin, Hydrocodone in past with no success, reports none of them "touched to pain".  Recently treated for PNA on 04/12/18 and was treated with :Levaquin.  Pulmonary nodules were noted on CXR and CT scan recommended.  Pt continues to report that he is having side pain.  Denies cough, cough up blood, weight loss, SOB, wheezing.  Does report some fatigue.    Chief Complaint  Patient presents with  . Establish Care  . Back Pain    pt states he has had worsening all over back pain in the last 3 weeks since he was seen at urgent care 04/14/18    Relevant past medical, surgical, family and social history reviewed and updated as indicated. Interim medical history since our last visit reviewed. Allergies and medications reviewed and updated.  Review of Systems  Constitutional: Positive for activity change and fatigue.  Negative for fever.  HENT: Negative for congestion and sore throat.   Eyes: Negative.   Respiratory: Negative for cough, chest tightness, shortness of breath and wheezing.   Cardiovascular: Negative for chest pain.  Gastrointestinal: Negative for abdominal distention and abdominal pain.  Endocrine: Negative.   Genitourinary: Negative.   Musculoskeletal: Positive for back pain.  Neurological: Negative for dizziness and headaches.  Psychiatric/Behavioral: Negative.     Per HPI unless specifically indicated above     Objective:    BP 138/80 (BP Location: Left Arm, Patient Position: Sitting)   Pulse 90   Temp 98.4 F (36.9 C) (Oral)   Ht 5' 5.9" (1.674 m)   Wt 171 lb (77.6 kg)   SpO2 98%   BMI 27.68 kg/m   Wt Readings from Last 3 Encounters:  04/26/18 171 lb (77.6 kg)  04/14/18 155 lb (70.3 kg)  09/09/17 160 lb (72.6 kg)    Physical Exam  Constitutional: He is oriented to person, place, and time. He appears well-developed and well-nourished.  HENT:  Head: Normocephalic and atraumatic.  Eyes: Pupils are equal, round, and reactive to light. EOM are normal.  Cardiovascular: Normal rate, regular rhythm and normal heart sounds.  Pulmonary/Chest: Effort normal and breath sounds normal.  Abdominal: Soft. Bowel sounds  are normal.  Musculoskeletal: Normal range of motion.  Neurological: He is alert and oriented to person, place, and time.  Skin: Skin is warm and dry.  Bilateral upper and lower extremity strength 5/5 and able to walk without difficulty.  Full ROM back with no grimacing or report discomfort.  Mild tenderness noted on palpation of ribs bilateral sides, under axilla.  No results found for this or any previous visit.    Assessment & Plan:   Problem List Items Addressed This Visit      Other   Pulmonary nodule    Noted on CXR for 04/12/18.   CT scan with contrast ordered.  Pt is in agreeance with POC and discussed plan for when CT scan results return.  Referral to  pulmonology per patient request.      Relevant Orders   DG Lumbar Spine Complete   CT Chest W Contrast   Ambulatory referral to Pulmonology   Chronic midline low back pain without sciatica - Primary    Chronic, ongoing issue.  Present per patient since 2018.  Had one fall this year off ladder.  Discussed with patient trying Biofreeze, chair yoga, heat and ice.  He prefers no consult with ortho at this time.  Back xray.          Follow up plan: Return in about 2 weeks (around 05/10/2018).

## 2018-04-26 NOTE — Telephone Encounter (Signed)
Done

## 2018-04-26 NOTE — Telephone Encounter (Signed)
Copied from [place] [id number]. Topic: Quick Communication - See Telephone Encounter >> Apr 26, 2018  1:04 PM Bea Graff, NT wrote: CRM for notification. See Telephone encounter for: 04/26/18. Pt would like to see if something for his lung pain and back pain can be sent to the pharmacy. He states OTC meds are not working. He states if need be at least just enough until he has his CT scan on Thursday.  [facility] [address], [place] [phone] (Phone) [phone] (Fax)

## 2018-04-26 NOTE — Assessment & Plan Note (Addendum)
Noted on CXR for 04/12/18.   CT scan with contrast ordered.  Pt is in agreeance with POC and discussed plan for when CT scan results return.  Referral to pulmonology per patient request.

## 2018-04-26 NOTE — Assessment & Plan Note (Signed)
Chronic, ongoing issue.  Present per patient since 2018.  Had one fall this year off ladder.  Discussed with patient trying Biofreeze, chair yoga, heat and ice.  He prefers no consult with ortho at this time.  Back xray.

## 2018-04-26 NOTE — Patient Instructions (Signed)

## 2018-04-26 NOTE — Telephone Encounter (Signed)
Copied from [place] [id number]. Topic: Referral - Request >> Apr 26, 2018 10:45 AM Burchel, Abbi R wrote: Reason for CRM:   Pt's wife requesting referral to lung specialist for pneumonia.  Pt is in a lot of pain. >> Apr 26, 2018 10:49 AM Guadalupe Maple, MD wrote: Can someone forward this back to [facility]

## 2018-04-27 ENCOUNTER — Encounter: Payer: Self-pay | Admitting: Emergency Medicine

## 2018-04-27 ENCOUNTER — Emergency Department: Payer: Medicare Other

## 2018-04-27 ENCOUNTER — Emergency Department
Admission: EM | Admit: 2018-04-27 | Discharge: 2018-04-27 | Disposition: A | Discharge status: Home / Self Care | Payer: Medicare Other | Attending: Emergency Medicine | Admitting: Emergency Medicine

## 2018-04-27 ENCOUNTER — Other Ambulatory Visit: Payer: Self-pay

## 2018-04-27 DIAGNOSIS — M859 Disorder of bone density and structure, unspecified: Secondary | ICD-10-CM | POA: Diagnosis not present

## 2018-04-27 DIAGNOSIS — M898X8 Other specified disorders of bone, other site: Secondary | ICD-10-CM | POA: Diagnosis not present

## 2018-04-27 DIAGNOSIS — Z87891 Personal history of nicotine dependence: Secondary | ICD-10-CM | POA: Insufficient documentation

## 2018-04-27 DIAGNOSIS — R0789 Other chest pain: Secondary | ICD-10-CM | POA: Diagnosis not present

## 2018-04-27 DIAGNOSIS — Z7982 Long term (current) use of aspirin: Secondary | ICD-10-CM | POA: Insufficient documentation

## 2018-04-27 DIAGNOSIS — R918 Other nonspecific abnormal finding of lung field: Secondary | ICD-10-CM

## 2018-04-27 DIAGNOSIS — R079 Chest pain, unspecified: Secondary | ICD-10-CM | POA: Diagnosis not present

## 2018-04-27 DIAGNOSIS — M898X9 Other specified disorders of bone, unspecified site: Secondary | ICD-10-CM

## 2018-04-27 LAB — CBC WITH DIFFERENTIAL/PLATELET
ABS IMMATURE GRANULOCYTES: 0.03 10*3/uL (ref 0.00–0.07)
BASOS ABS: 0.1 10*3/uL (ref 0.0–0.1)
Basophils Relative: 1 %
EOS ABS: 0.3 10*3/uL (ref 0.0–0.5)
Eosinophils Relative: 4 %
HEMATOCRIT: 46.3 % (ref 39.0–52.0)
Hemoglobin: 15.4 g/dL (ref 13.0–17.0)
Immature Granulocytes: 0 %
LYMPHS ABS: 1.8 10*3/uL (ref 0.7–4.0)
Lymphocytes Relative: 21 %
MCH: 31 pg (ref 26.0–34.0)
MCHC: 33.3 g/dL (ref 30.0–36.0)
MCV: 93.2 fL (ref 80.0–100.0)
MONOS PCT: 9 %
Monocytes Absolute: 0.8 10*3/uL (ref 0.1–1.0)
NEUTROS PCT: 65 %
NRBC: 0 % (ref 0.0–0.2)
Neutro Abs: 5.6 10*3/uL (ref 1.7–7.7)
Platelets: 258 10*3/uL (ref 150–400)
RBC: 4.97 MIL/uL (ref 4.22–5.81)
RDW: 12.7 % (ref 11.5–15.5)
WBC: 8.6 10*3/uL (ref 4.0–10.5)

## 2018-04-27 LAB — COMPREHENSIVE METABOLIC PANEL
ALBUMIN: 4.1 g/dL (ref 3.5–5.0)
ALT: 27 U/L (ref 0–44)
AST: 29 U/L (ref 15–41)
Alkaline Phosphatase: 97 U/L (ref 38–126)
Anion gap: 10 (ref 5–15)
BILIRUBIN TOTAL: 0.8 mg/dL (ref 0.3–1.2)
BUN: 9 mg/dL (ref 8–23)
CO2: 28 mmol/L (ref 22–32)
CREATININE: 0.99 mg/dL (ref 0.61–1.24)
Calcium: 9.5 mg/dL (ref 8.9–10.3)
Chloride: 103 mmol/L (ref 98–111)
GFR calc Af Amer: >60 mL/min (ref >60)
GFR calc non Af Amer: >60 mL/min (ref >60)
GLUCOSE: 80 mg/dL (ref 70–99)
POTASSIUM: 3.7 mmol/L (ref 3.5–5.1)
Sodium: 141 mmol/L (ref 135–145)
TOTAL PROTEIN: 7.6 g/dL (ref 6.5–8.1)

## 2018-04-27 LAB — TROPONIN I: Troponin I: <0.03 ng/mL (ref <0.03)

## 2018-04-27 MED ORDER — IOPAMIDOL (ISOVUE-370) INJECTION 76%
100.0000 mL | Freq: Once | INTRAVENOUS | Status: AC | PRN
  Administered 2018-04-27: 100 mL via INTRAVENOUS
  Filled 2018-04-27: qty 100

## 2018-04-27 MED ORDER — OXYCODONE-ACETAMINOPHEN 5-325 MG PO TABS
2.0000 | ORAL_TABLET | Freq: Once | ORAL | Status: AC
  Administered 2018-04-27: 2 via ORAL
  Filled 2018-04-27: qty 2

## 2018-04-27 MED ORDER — OXYCODONE-ACETAMINOPHEN 5-325 MG PO TABS: 1.0000 | ORAL_TABLET | ORAL | 0 refills | Status: DC | PRN

## 2018-04-27 MED ORDER — DOCUSATE SODIUM 100 MG PO CAPS: 100.0000 mg | ORAL_CAPSULE | Freq: Every day | ORAL | 2 refills | Status: DC | PRN

## 2018-04-27 NOTE — ED Triage Notes (Signed)
Pt to triage via w/c with no distress noted; st seen at urgent care wk ago and rx antibiotics and steroids for pneumonia which he completed; f/u with PCP yesterday and sched for CT scan on Thursday; c/o persistent upper CP radiating around into back with no accomp symptoms

## 2018-04-27 NOTE — Discharge Instructions (Addendum)
It appears that you likely have cancer in your lungs, and its also possibly the case that you have cancer that is gone to your spine which is what is been causing you this pain.  You  would prefer to go home which is certainly not unreasonable but it is the case that we would like you to come back to the emergency room if you have new or worrisome symptoms including numbness or weakness, pain that is not controlled by the medication at home, shortness of breath or fever, or any other new or worrisome concerns.  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  They should get you in right away.  Do not drink or drive on the pain medication.

## 2018-04-27 NOTE — ED Provider Notes (Signed)
[facility] Emergency Department Provider Note  ____________________________________________   I have reviewed the triage vital signs and the nursing notes. Where available I have reviewed prior notes and, if possible and indicated, outside hospital notes.    HISTORY  Chief Complaint Chest Pain    HPI [name] is a 67 y.o. male with a history of CAD, resents today complaining of upper back pain that is been radiating around his chest since early September.  Patient was seen at an urgent care had a chest x-ray which showed lung nodules and was treated for possible pneumonia, at that time, he did not have any cough or URI symptoms he states.  The patient is scheduled for an outpatient CT as his pain gets worse.  Is positional and sharp, seems to go around the ribs on both sides.  Is not pleuritic.  He denies significant shortness of breath.  He does sometimes hurt when he takes a deep breath.  He denies any leg swelling recent travel, he has no exertional symptoms, no personal family history of PE or DVT that he knows of.  The pain is constant.  Nothing makes it better, waxes and wanes but never completely goes away.  Has not had any narcotic pain medication for this from PCP he states.  He states they cannot give him that.  Does not have a history of chronic pain that he knows of. No numbness no weakness no incontinence of bowel or bladder.   Past Medical History:  Diagnosis Date  . Bulging lumbar disc   . Heart attack [facility])     Patient Active Problem List   Diagnosis Date Noted  . Pulmonary nodule 04/26/2018  . Chronic midline low back pain without sciatica 04/26/2018    Past Surgical History:  Procedure Laterality Date  . CARDIAC CATHETERIZATION    . KNEE SURGERY Left     Prior to Admission medications   Medication Sig Start Date End Date Taking? Authorizing Provider  aspirin EC 81 MG tablet Take 81 mg by mouth daily.    [provider]    Allergies Patient has no known allergies.  Family History  Problem Relation Age of Onset  . Heart failure Father   . Cancer Maternal Aunt   . Heart failure Maternal Uncle   . Dementia Paternal Grandmother   . Cancer Maternal Aunt     Social History Social History   Tobacco Use  . Smoking status: Former Smoker    Packs/day: 1.50    Years: 15.00    Pack years: 22.50    Types: Cigarettes    Last attempt to quit: 11/03/2003    Years since quitting: 14.4  . Smokeless tobacco: Never Used  Substance Use Topics  . Alcohol use: Yes    Alcohol/week: 15.0 standard drinks    Types: 15 Shots of liquor per week  . Drug use: No    Review of Systems Constitutional: No fever/chills Eyes: No visual changes. ENT: No sore throat. No stiff neck no neck pain Cardiovascular: Denies chest pain. Respiratory: Denies shortness of breath. Gastrointestinal:   no vomiting.  No diarrhea.  No constipation. Genitourinary: Negative for dysuria. Musculoskeletal: Negative lower extremity swelling Skin: Negative for rash. Neurological: Negative for severe headaches, focal weakness or numbness.   ____________________________________________   PHYSICAL EXAM:  VITAL SIGNS: ED Triage Vitals [04/27/18 1948]  Enc Vitals Group     BP 128/66     Pulse Rate 87  Resp 18     Temp 98.4 F (36.9 C)     Temp Source Oral     SpO2 99 %     Weight 171 lb (77.6 kg)     Height 5\' 6"  (1.676 m)     Head Circumference      Peak Flow      Pain Score 9     Pain Loc      Pain Edu?      Excl. in [place]?     Constitutional: Alert and oriented. Well appearing and in no acute distress. Eyes: Conjunctivae are normal Head: Atraumatic HEENT: No congestion/rhinnorhea. Mucous membranes are moist.  Oropharynx non-erythematous Neck:   Nontender with no meningismus, no masses, no stridor Cardiovascular: Normal rate, regular rhythm. Grossly normal heart sounds.  Good peripheral  circulation. Respiratory: Normal respiratory effort.  No retractions. Lungs CTAB. Abdominal: Soft and nontender. No distention. No guarding no rebound Back: Tenderness to palpation in the spine around T 45 and in the paraspinal muscles thereof, no obvious loss of landmarks deformity.  No lesions no crepitus.  There are no lesions noted. there is no CVA tenderness Musculoskeletal: No lower extremity tenderness, no upper extremity tenderness. No joint effusions, no DVT signs strong distal pulses no edema Neurologic:  Normal speech and language. No gross focal neurologic deficits are appreciated.  Skin:  Skin is warm, dry and intact. No rash noted. Psychiatric: Mood and affect are normal. Speech and behavior are normal.  ____________________________________________   LABS (all labs ordered are listed, but only abnormal results are displayed)  Labs Reviewed  CBC WITH DIFFERENTIAL/PLATELET  COMPREHENSIVE METABOLIC PANEL  TROPONIN I    Pertinent labs  results that were available during my care of the patient were reviewed by me and considered in my medical decision making (see chart for details). ____________________________________________  EKG  I personally interpreted any EKGs ordered by me or triage Sinus rhythm rate 85 bpm, LAD noted, possibly old lateral infarct and anterior infarct, no acute ischemic changes noted ____________________________________________  RADIOLOGY  Pertinent labs & imaging results that were available during my care of the patient were reviewed by me and considered in my medical decision making (see chart for details). If possible, patient and/or family made aware of any abnormal findings.  Dg Chest 2 View  Result Date: 04/27/2018 CLINICAL DATA:  Persistent upper chest pain radiating around to the back. Patient completed a course of antibiotics and steroids for pneumonia. EXAM: CHEST - 2 VIEW COMPARISON:  04/14/2018 FINDINGS: Heart and mediastinal contours are  stable. Persistent nodular opacities in both upper lobes with ill-defined parenchymal opacity in the infrahilar medial right thorax persist. Given lack of significant change despite antibiotics, differential considerations should include neoplasm. Degenerative changes are present along the dorsal spine. No definite osteolytic or blastic disease identified radiographically. IMPRESSION: No significant change in bilateral upper lobe nodular densities and right infrahilar masslike opacity of the lungs. Patient is scheduled for CT of the chest on Thursday. Electronically Signed   By: Ashley Royalty M.D.   On: 04/27/2018 20:29   Ct Angio Chest Pe W And/or Wo Contrast  Result Date: 04/27/2018 CLINICAL DATA:  Patient is finishing trial of antibiotics and steroids for pneumonia. Follow-up with primary care physician yesterday and schedule for CT on Thursday but has persistent upper chest pain radiating into the back. EXAM: CT ANGIOGRAPHY CHEST WITH CONTRAST TECHNIQUE: Multidetector CT imaging of the chest was performed using the standard protocol during bolus administration of intravenous  contrast. Multiplanar CT image reconstructions and MIPs were obtained to evaluate the vascular anatomy. CONTRAST:  155mL ISOVUE-370 IOPAMIDOL (ISOVUE-370) INJECTION 76% COMPARISON:  Chest radiograph 04/27/2018 FINDINGS: Cardiovascular: There is good opacification of the central and segmental pulmonary arteries. No focal filling defects. No evidence of significant pulmonary embolus. Normal heart size. No pericardial effusion. Coronary artery calcifications. Normal caliber thoracic aorta with calcification. No aortic dissection. Mediastinum/Nodes: Small esophageal hiatal hernia. Esophagus is decompressed. Prominent right hilar lymph nodes measuring up to about 1.4 cm diameter. No pathologic mediastinal lymphadenopathy. Lungs/Pleura: Evaluation is limited due to motion artifact. Emphysematous changes throughout the lungs. There is a mass  lesion in the superior segment right lower lobe posteriorly measuring 4.1 by 3.1 cm. This is causing obliteration of the in coming bronchus with mild postobstructive change inferiorly. Multiple bilateral smaller pulmonary nodules are demonstrated. These range in size from less than 1 cm up to a maximum of about 14 mm in diameter. No consolidation. No pleural effusions. No pneumothorax. Upper Abdomen: No adrenal gland nodules. Small cyst in the upper pole of the right kidney. Spleen is incompletely imaged but appears enlarged. Musculoskeletal: Degenerative changes in the spine. Destructive lesion in the T4 vertebra with mild compression and adjacent soft tissue infiltration. No additional focal bone lesions are demonstrated. Review of the MIP images confirms the above findings. IMPRESSION: 1. No evidence of significant pulmonary embolus. 2. 4.1 cm mass in the superior segment right lower lobe posteriorly causing obliteration of the in coming bronchus and mild postobstructive change. Multiple additional bilateral pulmonary nodules. Enlarged right hilar lymph nodes. 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 3. 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  Mild associated compression. 4. Emphysematous changes in the lungs. Aortic Atherosclerosis (ICD10-I70.0) and Emphysema (ICD10-J43.9). Electronically Signed   By: Lucienne Capers M.D.   On: 04/27/2018 21:33   ____________________________________________    PROCEDURES  Procedure(s) performed: None  Procedures  Critical Care performed: None  ____________________________________________   INITIAL IMPRESSION / ASSESSMENT AND PLAN / ED COURSE  Pertinent labs & imaging results that were available during my care of the patient were reviewed by me and considered in my medical decision making (see chart for details).   Physical very concerning for cancer with mets to the spine, CT scan verifies this.  Will call oncology.  We have given him pain  medications.  Neurologically intact at this time.  Patient made aware of findings, unclear exactly prognosis given lack of tissue diagnosis.  We will see what oncology's recommendations are.    ____________________________________________   FINAL CLINICAL IMPRESSION(S) / ED DIAGNOSES  Final diagnoses:  None      This chart was dictated using voice recognition software.  Despite best efforts to proofread,  errors can occur which can change meaning.      Schuyler Amor, MD 04/27/18 2153

## 2018-04-28 ENCOUNTER — Encounter: Payer: Self-pay | Admitting: *Deleted

## 2018-04-28 ENCOUNTER — Telehealth: Payer: Self-pay

## 2018-04-28 DIAGNOSIS — R918 Other nonspecific abnormal finding of lung field: Secondary | ICD-10-CM

## 2018-04-28 NOTE — Telephone Encounter (Signed)
-----   Message from [name], NP sent at 04/28/2018  3:37 PM EDT ----- Regarding: Appointment Can we see if we can call this patient and move his appointment up.  He has a PET scan and initial appt with oncology on Friday this week and sees Josh with Palliative team this upcoming Monday.  See if he could come in Tuesday afternoon or Wednesday morning to see me for follow-up.  Thank you.  He is not scheduled to see me until the 21st I believe and I would like to see him sooner.

## 2018-04-28 NOTE — Telephone Encounter (Signed)
Called and spoke to patient and let him know what Jolene said. Patient stated that he does not think he is coming back to [facility] because he thinks he was not treated fairly. Patient stated that he saw Jolene on Monday, 04/26/2018 and he told her that he was in pain and if she can give him something for it but Jolene told him take some tylenol and will schedule a CAT scan. Pt stated that he continued in a lot of pain so later that day called back to the office and ask to talk to [facility]. Pt stated that since he did not hear anything from [place] that day or the next day so he decided to go to the ER last night. Patient stated that he got the CT scan done at the ER last night and the doctor said that he has lung cancer and this doctor immediately prescribed him some medication for pain. Patient states that the ER doctor told him that should have been done since the beginning as he was in a lot of pain. He wants to cancel the appointment he has with Jolene on 05/10/2018.

## 2018-04-28 NOTE — Progress Notes (Signed)
  Oncology Nurse Navigator Documentation  Navigator Location: [facility]Med [facility]c (04/28/18 1300) Referral date to RadOnc/MedOnc: 04/28/18 (04/28/18 1300) )Navigator Encounter Type: Introductory phone call (04/28/18 1300)   Abnormal Finding Date: 04/27/18 (04/28/18 1300)                   Treatment Phase: Abnormal Scans (04/28/18 1300) Barriers/Navigation Needs: Coordination of Care (04/28/18 1300)   Interventions: Coordination of Care (04/28/18 1300)   Coordination of Care: Appts;Radiology (04/28/18 1300)        Acuity: Level 2 (04/28/18 1300)   Acuity Level 2: Initial guidance, education and coordination as needed;Educational needs;Assistance expediting appointments (04/28/18 1300)  phone call made to patient to introduce to navigator services and to give new appt info to patient. Pt scheduled for PET scan on 10/11 at 12:30pm with 12pm arrival time at the medical mall for registration. Pt informed that after PET scan will see Dr. Mike Gip for consultation. Pt informed that PCP has been updated with results and informed that will be cancelling CT chest that is scheduled tomorrow morning. All questions answered at the time of call. Pt verbalized understanding. Nothing further needed at this time.   Time Spent with Patient: 30 (04/28/18 1300)

## 2018-04-29 ENCOUNTER — Ambulatory Visit: Payer: Medicare Other | Attending: Nurse Practitioner

## 2018-04-30 ENCOUNTER — Other Ambulatory Visit: Payer: Self-pay

## 2018-04-30 ENCOUNTER — Ambulatory Visit
Admission: RE | Admit: 2018-04-30 | Discharge: 2018-04-30 | Disposition: A | Discharge status: Home / Self Care | Payer: Medicare Other | Source: Ambulatory Visit | Attending: Urgent Care | Admitting: Urgent Care

## 2018-04-30 ENCOUNTER — Inpatient Hospital Stay: Payer: Medicare Other

## 2018-04-30 ENCOUNTER — Inpatient Hospital Stay: Payer: Medicare Other | Attending: Hematology and Oncology | Admitting: Hematology and Oncology

## 2018-04-30 ENCOUNTER — Encounter: Payer: Self-pay | Admitting: Hematology and Oncology

## 2018-04-30 ENCOUNTER — Encounter: Payer: Self-pay | Admitting: *Deleted

## 2018-04-30 VITALS — BP 134/88 | HR 73 | Temp 98.4°F | Resp 18 | Ht 67.0 in | Wt 171.2 lb

## 2018-04-30 DIAGNOSIS — G893 Neoplasm related pain (acute) (chronic): Secondary | ICD-10-CM | POA: Insufficient documentation

## 2018-04-30 DIAGNOSIS — C3491 Malignant neoplasm of unspecified part of right bronchus or lung: Secondary | ICD-10-CM | POA: Diagnosis not present

## 2018-04-30 DIAGNOSIS — C7951 Secondary malignant neoplasm of bone: Secondary | ICD-10-CM | POA: Insufficient documentation

## 2018-04-30 DIAGNOSIS — R918 Other nonspecific abnormal finding of lung field: Secondary | ICD-10-CM | POA: Diagnosis not present

## 2018-04-30 DIAGNOSIS — Z01812 Encounter for preprocedural laboratory examination: Secondary | ICD-10-CM

## 2018-04-30 DIAGNOSIS — Z87891 Personal history of nicotine dependence: Secondary | ICD-10-CM | POA: Diagnosis not present

## 2018-04-30 DIAGNOSIS — Z515 Encounter for palliative care: Secondary | ICD-10-CM | POA: Insufficient documentation

## 2018-04-30 DIAGNOSIS — Z23 Encounter for immunization: Secondary | ICD-10-CM | POA: Diagnosis not present

## 2018-04-30 DIAGNOSIS — Z79899 Other long term (current) drug therapy: Secondary | ICD-10-CM

## 2018-04-30 DIAGNOSIS — Z7982 Long term (current) use of aspirin: Secondary | ICD-10-CM | POA: Insufficient documentation

## 2018-04-30 DIAGNOSIS — C801 Malignant (primary) neoplasm, unspecified: Secondary | ICD-10-CM

## 2018-04-30 DIAGNOSIS — I252 Old myocardial infarction: Secondary | ICD-10-CM | POA: Insufficient documentation

## 2018-04-30 DIAGNOSIS — R222 Localized swelling, mass and lump, trunk: Secondary | ICD-10-CM | POA: Diagnosis not present

## 2018-04-30 LAB — PROTIME-INR
INR: 0.94
Prothrombin Time: 12.5 seconds (ref 11.4–15.2)

## 2018-04-30 LAB — GLUCOSE, CAPILLARY: Glucose-Capillary: 91 mg/dL (ref 70–99)

## 2018-04-30 MED ORDER — OXYCODONE HCL 5 MG PO TABS: 5.0000 mg | ORAL_TABLET | Freq: Four times a day (QID) | ORAL | 0 refills | Status: DC | PRN

## 2018-04-30 MED ORDER — FLUDEOXYGLUCOSE F - 18 (FDG) INJECTION
9.0400 | Freq: Once | INTRAVENOUS | Status: AC | PRN
  Administered 2018-04-30: 9.04 via INTRAVENOUS

## 2018-04-30 NOTE — Progress Notes (Signed)
[place] [facility] day:  04/30/2018  Chief Complaint: Jimmy West is a 67 y.o. male with a right lung mass and a T4 metastasis who is referred in consultation by Dr. Burlene Arnt for assessment and management.  HPI:   The patient has a 22.5 pack year smoking history.  He presented to the [facility] ER on 04/27/2018 with a 1 month history of upper back pain radiating to his chest.  He was seen in urgent care.  CXR revealed pulmonary nodules.  He was treated with antibiotics and scheduled for a chest CT.  Chest CT angiogram on 04/27/2018 revealed no evidence of significant pulmonary embolus.  There was a 4.1 cm mass in the superior segment right lower lobe posteriorly causing obliteration of the in coming bronchus and mild postobstructive change. There were multiple additional bilateral pulmonary nodules (< 1 cm - 1.4 cm).   There were enlarged right hilar lymph nodes.  There was metastasis to the T4 vertebra with mild associated compression.  There was emphysematous changes in the lungs.  Labs on 04/27/2018 revealed a normal CBC and CMP.  PET imaging obtained prior to clinic today in efforts to expedite his care due to findings noted on CT. PET revealed the following: 1. Hypermetabolic RLL pulmonary mass that measures 3.4 cm (SUV 13.2).  2. Pulmonary nodule in the LEFT lung measures 11 mm (SUV 4.5).  3. RIGHT paratracheal lymph node noted with a SUV of 5.1.  4. Hypermetabolic activity within the T4 vertebral body (SUV 10.2).   Symptomatically, he denies any increased shortness of breath. Patient complains of pain in his upper back with intermittent radiation into the chest. Patient is using Percocet 5/325 mg PRN pain as prescribed in the ED. He is using, on average, about 3 pills/day. Patient denies that he has experienced any B symptoms. He denies any interval infections. Patient denies bleeding; no hematochezia, melena, or gross hematuria. He has never had a  colonoscopy.   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 He has numbness in his LEFT heel as a result.   Patient advises that he maintains an adequate appetite. He is eating well. Weight today is 171 lb 3.2 oz (77.7 kg). He denies significant weight loss in the recent past.   Patient complains of pain rated 8/10 in the clinic today.   Past Medical History:  Diagnosis Date  . Bulging lumbar disc   . Heart attack [facility])     Past Surgical History:  Procedure Laterality Date  . CARDIAC CATHETERIZATION    . KNEE SURGERY Left     Family History  Problem Relation Age of Onset  . Heart failure Father   . Cancer Maternal Aunt   . Heart failure Maternal Uncle   . Dementia Paternal Grandmother   . Cancer Maternal Aunt     Social History:  reports that he quit smoking about 14 years ago. His smoking use included cigarettes. He has a 22.50 pack-year smoking history. He has never used smokeless tobacco. He reports that he drinks about 15.0 standard drinks of alcohol per week. He reports that he does not use drugs. He started smoking at age 45.   He is smoking 1 1/2 packs/day.  Patient is employed as as full time Probation officer. Patient denies known exposures to radiation on toxins. The patient is accompanied by Eileen Stanford (RN navigator) today.  Allergies: No Known Allergies  Current Medications: Current Outpatient Medications  Medication Sig  Dispense Refill  . aspirin EC 81 MG tablet Take 81 mg by mouth daily.    Marland Kitchen docusate sodium (COLACE) 100 MG capsule Take 1 capsule (100 mg total) by mouth daily as needed. 30 capsule 2  . oxyCODONE-acetaminophen (PERCOCET) 5-325 MG tablet Take 1 tablet by mouth every 4 (four) hours as needed for up to 5 days for severe pain. 20 tablet 0   No current facility-administered medications for this visit.     Review of Systems:  GENERAL:  Activity affected by back pain.  No fevers, sweats or weight loss. PERFORMANCE STATUS (ECOG):   1 HEENT:  No visual changes, runny nose, sore throat, mouth sores or tenderness. Lungs: No shortness of breath or cough.  No hemoptysis. Cardiac:  No chest pain, palpitations, orthopnea, or PND. GI:  No nausea, vomiting, diarrhea, constipation, melena or hematochezia. GU:  No urgency, frequency, dysuria, or hematuria. Musculoskeletal:  Localized back pain (T4 associated with metastasis).  No joint pain.  No muscle tenderness. Extremities:  No pain or swelling. Skin:  No rashes or skin changes. Neuro:  Numbness left heel (old).  No headache, focal weakness, balance or coordination issues. Endocrine:  No diabetes, thyroid issues, hot flashes or night sweats. Psych:  No mood changes, depression or anxiety. Pain: Back pain (8 out of 10). Review of systems:  All other systems reviewed and found to be negative.  Physical Exam: Blood pressure 134/88, pulse 73, temperature 98.4 F (36.9 C), temperature source Tympanic, resp. rate 18, height 5\' 7"  (1.702 m), weight 171 lb 3.2 oz (77.7 kg), SpO2 95 %. GENERAL:  Well developed, well nourished, gentleman sitting comfortably in the exam room in no acute distress.  He is tearful at times secondary to pain. MENTAL STATUS:  Alert and oriented to person, place and time. HEAD:  Lilyan Punt and mustache.  Normocephalic, atraumatic, face symmetric, no Cushingoid features. EYES:  Glasses. Blue eyes.  Pupils equal round and reactive to light and accomodation.  No conjunctivitis or scleral icterus. ENT:  Oropharynx clear without lesion.  Tongue normal.  Upper dentures.  Mucous membranes moist.  RESPIRATORY:  Clear to auscultation without rales, wheezes or rhonchi. CARDIOVASCULAR:  Regular rate and rhythm without murmur, rub or gallop. BACK:  Focal pain on palpation at T4.  No other painful areas along spine. ABDOMEN:  Soft, non-tender, with active bowel sounds, and no hepatosplenomegaly.  No masses. SKIN:  No rashes, ulcers or lesions. EXTREMITIES: No edema, no  skin discoloration or tenderness.  No palpable cords. LYMPH NODES: No palpable cervical, supraclavicular, axillary or inguinal adenopathy  NEUROLOGICAL: Alert & oriented, cranial nerves II-XII grossly intact; motor strength 5/5 throughout; sensation intact; right patellar reflex 2+.  Left patellar reflex trace. PSYCH:  Appropriate.   Hospital Outpatient Visit on 04/30/2018  Component Date Value Ref Range Status  . Glucose-Capillary 04/30/2018 91  70 - 99 mg/dL Final    Assessment:  Jimmy West is a 67 y.o. male with a right lung mass and T4 vertebral metastasis suspicious for metastatic lung cancer. Clinical stage is T4N1M1.  Chest CT angiogram on 04/27/2018 revealed no evidence of significant pulmonary embolus.  There was a 4.1 cm mass in the superior segment right lower lobe posteriorly causing obliteration of the in coming bronchus and mild postobstructive change. There were multiple additional bilateral pulmonary nodules (< 1 cm - 1.4 cm).   There were enlarged right hilar lymph nodes.  There was metastasis to the T4 vertebra with mild associated compression.  There was emphysematous changes in the lungs.  PET scan on 04/30/2018 revealed a 3.4 cm hypermetabolic RLL pulmonary mass (SUV 13.2), 11 mm pulmonary nodule in the LEFT lung (SUV 4.5), RIGHT paratracheal lymph node (SUV 5.1), and hypermetabolic activity within the T4 vertebral body (SUV 10.2).   Symptomatically, he notes significant back pain at T4.  Exam reveals no adenopathy or hepatosplenomegaly.  Neurologic exam is intact.  Plan: 1.  Labs today: PT/INR. 2.  Lung mass and T4 vertebral metastasis:  Images reviewed personally with patient.  Agree with radiology interpretation.  Discuss likely diagnosis of lung cancer.  Discuss small cell versus non-small cell lung cancer. Preliminary discussion regarding treatment based on pathology and driver mutations.   Discuss plan for tissue biopsy.  Discuss CT guided RLL lung biopsy  versus bronchoscopy.  Spoke with radiology, agree with CT guided biopsy as RLL mass is near the pleura.  Discuss plan for head MRI and thoracic spine MRI. 3.  Cancer-related pain:  Pain has inadequate relief with Percocet 5/325, and improved relief after taking Percocet x 2.  Rx: oxycodone 5-10 mg po q 6 hours prn pain (dis: #30).  Patient to keep pain dairy for conversion to long acting pain medication. 4.  RTC after imaging and biopsy for MD assessment, review of results, and discussion regarding direction of therapy.   Honor Loh, NP  04/30/2018, 4:27 PM   I saw and evaluated the patient, participating in the key portions of the service and reviewing pertinent diagnostic studies and records.  I reviewed the nurse practitioner's note and agree with the findings and the plan.  The assessment and plan were discussed with the patient.  Additional diagnostic studies of head and thoracic spine MRI are needed and would change the clinical management. Multiple questions were asked by the patient and answered.   Nolon Stalls, MD 04/30/2018,4:27 PM

## 2018-04-30 NOTE — Progress Notes (Signed)
Patient here for initial visit. Complains of pain to chest and back 8/10. Pain started about 4 weeks ago.

## 2018-05-01 DIAGNOSIS — G893 Neoplasm related pain (acute) (chronic): Secondary | ICD-10-CM | POA: Insufficient documentation

## 2018-05-03 ENCOUNTER — Ambulatory Visit
Admission: RE | Admit: 2018-05-03 | Discharge: 2018-05-03 | Disposition: A | Discharge status: Home / Self Care | Payer: Medicare Other | Source: Ambulatory Visit | Attending: Urgent Care | Admitting: Urgent Care

## 2018-05-03 ENCOUNTER — Inpatient Hospital Stay: Payer: Medicare Other | Admitting: Hospice and Palliative Medicine

## 2018-05-03 DIAGNOSIS — R918 Other nonspecific abnormal finding of lung field: Secondary | ICD-10-CM

## 2018-05-03 DIAGNOSIS — C3490 Malignant neoplasm of unspecified part of unspecified bronchus or lung: Secondary | ICD-10-CM | POA: Diagnosis not present

## 2018-05-03 DIAGNOSIS — C7951 Secondary malignant neoplasm of bone: Secondary | ICD-10-CM | POA: Diagnosis not present

## 2018-05-03 DIAGNOSIS — M8458XA Pathological fracture in neoplastic disease, other specified site, initial encounter for fracture: Secondary | ICD-10-CM | POA: Diagnosis not present

## 2018-05-03 DIAGNOSIS — I6381 Other cerebral infarction due to occlusion or stenosis of small artery: Secondary | ICD-10-CM | POA: Diagnosis not present

## 2018-05-03 DIAGNOSIS — Z01812 Encounter for preprocedural laboratory examination: Secondary | ICD-10-CM

## 2018-05-03 MED ORDER — GADOBUTROL 1 MMOL/ML IV SOLN
7.5000 mL | Freq: Once | INTRAVENOUS | Status: AC | PRN
  Administered 2018-05-03: 7.5 mL via INTRAVENOUS

## 2018-05-03 NOTE — Progress Notes (Signed)
  Oncology Nurse Navigator Documentation  Navigator Location: [facility] (04/30/18 1700)   )Navigator Encounter Type: Initial MedOnc (04/30/18 1700)                       Treatment Phase: Abnormal Scans (04/30/18 1700) Barriers/Navigation Needs: Coordination of Care (04/30/18 1700)   Interventions: Coordination of Care (04/30/18 1700)   Coordination of Care: Appts;Radiology (04/30/18 1700)         met with patient during initial med-onc consultation with Dr. Mike Gip. All imaging reviewed with patient by Dr. Mike Gip. All questions answered during visit. Reviewed upcoming appts with patient. Pt informed that he will be notified by phone with appt for his biopsy. Contact info given and instructed to call with any further questions or needs. Pt verbalized understanding.         Time Spent with Patient: 60 (04/30/18 1700)

## 2018-05-04 ENCOUNTER — Inpatient Hospital Stay (HOSPITAL_BASED_OUTPATIENT_CLINIC_OR_DEPARTMENT_OTHER): Payer: Medicare Other | Admitting: Hospice and Palliative Medicine

## 2018-05-04 ENCOUNTER — Telehealth: Payer: Self-pay | Admitting: *Deleted

## 2018-05-04 VITALS — BP 103/68 | HR 64 | Temp 98.4°F | Resp 18

## 2018-05-04 DIAGNOSIS — Z515 Encounter for palliative care: Secondary | ICD-10-CM | POA: Diagnosis not present

## 2018-05-04 DIAGNOSIS — Z7982 Long term (current) use of aspirin: Secondary | ICD-10-CM

## 2018-05-04 DIAGNOSIS — G893 Neoplasm related pain (acute) (chronic): Secondary | ICD-10-CM

## 2018-05-04 DIAGNOSIS — C3491 Malignant neoplasm of unspecified part of right bronchus or lung: Secondary | ICD-10-CM | POA: Diagnosis not present

## 2018-05-04 DIAGNOSIS — R918 Other nonspecific abnormal finding of lung field: Secondary | ICD-10-CM

## 2018-05-04 DIAGNOSIS — Z79899 Other long term (current) drug therapy: Secondary | ICD-10-CM | POA: Diagnosis not present

## 2018-05-04 DIAGNOSIS — Z87891 Personal history of nicotine dependence: Secondary | ICD-10-CM

## 2018-05-04 DIAGNOSIS — I252 Old myocardial infarction: Secondary | ICD-10-CM

## 2018-05-04 DIAGNOSIS — C7951 Secondary malignant neoplasm of bone: Secondary | ICD-10-CM | POA: Diagnosis not present

## 2018-05-04 DIAGNOSIS — Z23 Encounter for immunization: Secondary | ICD-10-CM | POA: Diagnosis not present

## 2018-05-04 MED ORDER — OXYCODONE HCL 10 MG PO TABS: 10.0000 mg | ORAL_TABLET | Freq: Four times a day (QID) | ORAL | 0 refills | Status: DC | PRN

## 2018-05-04 NOTE — Telephone Encounter (Signed)
Patient agrees to discuss with J Borders today

## 2018-05-04 NOTE — Telephone Encounter (Signed)
S/O called reporting that pain medicine is not strong enougha dn is asking for an increase in his pain medicine She states to call him back [phone]

## 2018-05-04 NOTE — Progress Notes (Signed)
Patient here today for palliative care consult with [facility].  Patient accompanied by friends and coworker today.

## 2018-05-04 NOTE — Telephone Encounter (Signed)
He has an appointment today with Josh Borders to discuss goals of care and pain management.

## 2018-05-04 NOTE — Patient Instructions (Signed)
May take oxycodone 10-20mg  every 6 hours as needed for pain.  Hold oxycodone for sedation. Do not drive with this medication.  May use acetaminophen 500mg  with ibuprofen 200mg  every 6 hours as needed for pain May take senna 1 tablet daily as needed for constipation. May increase to 1 tablet twice daily if needed.  Please call the clinic if pain persists or does not improve on changed pain regimen.

## 2018-05-04 NOTE — Progress Notes (Signed)
[place]  [phone] [phone]   Name: Jimmy West Date: 05/04/2018 MRN: [id number]  DOB: [birth year]  Patient Care Team: [name], NP as PCP - General (Nurse Practitioner) Telford Nab, RN as Registered Nurse [place], Gerda Diss, MD as Attending Physician (Emergency Medicine)    REASON FOR CONSULTATION: Palliative Care consult requested for this 67 y.o. male with multiple medical problems including presumed metastatic lung cancer.  Patient was noted to have pulmonary nodules on recent chest x-ray from PCP.  He was subsequently referred to oncology for work-up and saw Dr. Mike Gip on 04/30/2018.  PET scan revealed several hypermetabolic pulmonary nodules and activity within the T4 vertebral body.  Follow-up MRI of the spine revealed T4 metastasis with pathologic compression deformity and mild spinal canal stenosis with probable edema and extraosseous extension of neoplasm from mid T3 to mid T5.  Patient has had worsening pain to the thoracic spine requiring increased opioids.  Patient was referred today to the palliative care clinic to assist with symptom management and establishing treatment goals.   SOCIAL HISTORY:    Patient has not married and has no children.  He has a brother who lives out of state.  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  ADVANCE DIRECTIVES:  Does not have.  CODE STATUS: Full code  PAST MEDICAL HISTORY: Past Medical History:  Diagnosis Date  . Bulging lumbar disc   . Heart attack ([place])     PAST SURGICAL HISTORY:  Past Surgical History:  Procedure Laterality Date  . CARDIAC CATHETERIZATION    . KNEE SURGERY Left     HEMATOLOGY/ONCOLOGY HISTORY:   No history exists.    ALLERGIES:  has No Known Allergies.  MEDICATIONS:  Current Outpatient Medications  Medication Sig Dispense Refill  . aspirin EC 81 MG tablet Take 81 mg  by mouth daily.    Marland Kitchen docusate sodium (COLACE) 100 MG capsule Take 1 capsule (100 mg total) by mouth daily as needed. 30 capsule 2  . Oxycodone HCl 10 MG TABS Take 1-2 tablets (10-20 mg total) by mouth every 6 (six) hours as needed. 60 tablet 0   No current facility-administered medications for this visit.     VITAL SIGNS: BP 103/68 (BP Location: Left Arm, Patient Position: Sitting)   Pulse 64   Temp 98.4 F (36.9 C) (Tympanic)   Resp 18  There were no vitals filed for this visit.  Estimated body mass index is 26.81 kg/m as calculated from the following:   Height as of 04/30/18: 5' 7"  (1.702 m).   Weight as of 04/30/18: 171 lb 3.2 oz (77.7 kg).  LABS: CBC:    Component Value Date/Time   WBC 8.6 04/27/2018 1951   HGB 15.4 04/27/2018 1951   HGB 14.9 04/06/2014 0826   HCT 46.3 04/27/2018 1951   HCT 44.8 04/06/2014 0826   PLT 258 04/27/2018 1951   PLT 238 04/06/2014 0826   MCV 93.2 04/27/2018 1951   MCV 95 04/06/2014 0826   NEUTROABS 5.6 04/27/2018 1951   NEUTROABS 3.9 04/06/2014 0826   LYMPHSABS 1.8 04/27/2018 1951   LYMPHSABS 1.1 04/06/2014 0826   MONOABS 0.8 04/27/2018 1951   MONOABS 0.7 04/06/2014 0826   EOSABS 0.3 04/27/2018 1951   EOSABS 0.1 04/06/2014 0826   BASOSABS 0.1 04/27/2018 1951   BASOSABS 0.1 04/06/2014 0826   Comprehensive Metabolic Panel:    Component Value Date/Time  NA 141 04/27/2018 1951   NA 140 04/06/2014 0826   K 3.7 04/27/2018 1951   K 4.0 04/06/2014 0826   CL 103 04/27/2018 1951   CL 108 (H) 04/06/2014 0826   CO2 28 04/27/2018 1951   CO2 25 04/06/2014 0826   BUN 9 04/27/2018 1951   BUN 12 04/06/2014 0826   CREATININE 0.99 04/27/2018 1951   CREATININE 1.17 04/06/2014 0826   GLUCOSE 80 04/27/2018 1951   GLUCOSE 106 (H) 04/06/2014 0826   CALCIUM 9.5 04/27/2018 1951   CALCIUM 8.9 04/06/2014 0826   AST 29 04/27/2018 1951   AST 24 04/06/2014 0826   ALT 27 04/27/2018 1951   ALT 33 04/06/2014 0826   ALKPHOS 97 04/27/2018 1951   ALKPHOS  85 04/06/2014 0826   BILITOT 0.8 04/27/2018 1951   BILITOT 0.8 04/06/2014 0826   PROT 7.6 04/27/2018 1951   PROT 7.3 04/06/2014 0826   ALBUMIN 4.1 04/27/2018 1951   ALBUMIN 3.8 04/06/2014 0826    RADIOGRAPHIC STUDIES: Dg Chest 2 View  Result Date: 04/27/2018 CLINICAL DATA:  Persistent upper chest pain radiating around to the back. Patient completed a course of antibiotics and steroids for pneumonia. EXAM: CHEST - 2 VIEW COMPARISON:  04/14/2018 FINDINGS: Heart and mediastinal contours are stable. Persistent nodular opacities in both upper lobes with ill-defined parenchymal opacity in the infrahilar medial right thorax persist. Given lack of significant change despite antibiotics, differential considerations should include neoplasm. Degenerative changes are present along the dorsal spine. No definite osteolytic or blastic disease identified radiographically. IMPRESSION: No significant change in bilateral upper lobe nodular densities and right infrahilar masslike opacity of the lungs. Patient is scheduled for CT of the chest on Thursday. Electronically Signed   By: Ashley Royalty M.D.   On: 04/27/2018 20:29   Dg Chest 2 View  Result Date: 04/14/2018 CLINICAL DATA:  Postoperative chest. EXAM: CHEST - 2 VIEW COMPARISON:  Chest radiograph September 09, 2017 FINDINGS: New rounded consolidation project over RIGHT heart border on AP radiograph, difficult to localized on lateral radiograph. In addition, at least 2 and possibly 3 new pulmonary nodules. Cardiac silhouette size is normal. Coronary artery stent present. Mediastinal silhouette is not suspicious. No pleural effusion. No pneumothorax. Mild degenerative change of the thoracic spine. IMPRESSION: RIGHT middle lobe consolidation concerning for pneumonia. At least 2 new pulmonary nodules. Constellation of findings also seen with neoplasm. Recommend contrast enhanced CT chest. These results will be called to the ordering clinician or representative by the  Radiologist Assistant, and communication documented in the PACS or zVision Dashboard. Electronically Signed   By: Elon Alas M.D.   On: 04/14/2018 18:44   Ct Angio Chest Pe W And/or Wo Contrast  Result Date: 04/27/2018 CLINICAL DATA:  Patient is finishing trial of antibiotics and steroids for pneumonia. Follow-up with primary care physician yesterday and schedule for CT on Thursday but has persistent upper chest pain radiating into the back. EXAM: CT ANGIOGRAPHY CHEST WITH CONTRAST TECHNIQUE: Multidetector CT imaging of the chest was performed using the standard protocol during bolus administration of intravenous contrast. Multiplanar CT image reconstructions and MIPs were obtained to evaluate the vascular anatomy. CONTRAST:  170m ISOVUE-370 IOPAMIDOL (ISOVUE-370) INJECTION 76% COMPARISON:  Chest radiograph 04/27/2018 FINDINGS: Cardiovascular: There is good opacification of the central and segmental pulmonary arteries. No focal filling defects. No evidence of significant pulmonary embolus. Normal heart size. No pericardial effusion. Coronary artery calcifications. Normal caliber thoracic aorta with calcification. No aortic dissection. Mediastinum/Nodes: Small esophageal hiatal hernia.  Esophagus is decompressed. Prominent right hilar lymph nodes measuring up to about 1.4 cm diameter. No pathologic mediastinal lymphadenopathy. Lungs/Pleura: Evaluation is limited due to motion artifact. Emphysematous changes throughout the lungs. There is a mass lesion in the superior segment right lower lobe posteriorly measuring 4.1 by 3.1 cm. This is causing obliteration of the in coming bronchus with mild postobstructive change inferiorly. Multiple bilateral smaller pulmonary nodules are demonstrated. These range in size from less than 1 cm up to a maximum of about 14 mm in diameter. No consolidation. No pleural effusions. No pneumothorax. Upper Abdomen: No adrenal gland nodules. Small cyst in the upper pole of the  right kidney. Spleen is incompletely imaged but appears enlarged. Musculoskeletal: Degenerative changes in the spine. Destructive lesion in the T4 vertebra with mild compression and adjacent soft tissue infiltration. No additional focal bone lesions are demonstrated. Review of the MIP images confirms the above findings. IMPRESSION: 1. No evidence of significant pulmonary embolus. 2. 4.1 cm mass in the superior segment right lower lobe posteriorly causing obliteration of the in coming bronchus and mild postobstructive change. Multiple additional bilateral pulmonary nodules. Enlarged right hilar lymph nodes. 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 3. 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  Mild associated compression. 4. Emphysematous changes in the lungs. Aortic Atherosclerosis (ICD10-I70.0) and Emphysema (ICD10-J43.9). Electronically Signed   By: Lucienne Capers M.D.   On: 04/27/2018 21:33   Mr Jeri Cos EY Contrast  Result Date: 05/03/2018 CLINICAL DATA:  67 y/o  M; lung cancer with T4 lesion on PET-CT. EXAM: MRI HEAD WITHOUT AND WITH CONTRAST TECHNIQUE: Multiplanar, multiecho pulse sequences of the brain and surrounding structures were obtained without and with intravenous contrast. CONTRAST:  7.5 cc Gadavist COMPARISON:  Concurrent MRI of the thoracic spine. 04/30/2018 PET-CT. FINDINGS: Brain: No acute infarction, hemorrhage, hydrocephalus, or extra-axial collection. There are 3 small chronic cortical infarctions within the left parietal lobe and chronic lacunar infarct within the right caudate head. Several nonspecific T2 FLAIR hyperintensities in subcortical and periventricular white matter are compatible with mild chronic microvascular ischemic changes for age. Mild volume loss of the brain. After administration of intravenous contrast there is no abnormal enhancement of the brain. No focal mass effect. Vascular: Normal flow voids. Skull and upper cervical spine: Normal marrow signal.  Sinuses/Orbits: Mild-to-moderate frontal, ethmoid, and sphenoid sinus mucosal thickening. No abnormal signal of the mastoid air cells. Orbits are unremarkable. Other: None. IMPRESSION: 1. No intracranial metastatic disease identified. 2. Mild chronic microvascular ischemic changes and volume loss of the brain. 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 3. Mild paranasal sinus disease. Electronically Signed   By: Kristine Garbe M.D.   On: 05/03/2018 22:44   Mr Thoracic Spine W Wo Contrast  Result Date: 05/03/2018 CLINICAL DATA:  67 y/o  M; lung cancer with T4 lesion on PET-CT. EXAM: MRI THORACIC WITHOUT AND WITH CONTRAST TECHNIQUE: Multiplanar and multiecho pulse sequences of the thoracic spine were obtained without and with intravenous contrast. CONTRAST:  7.5 cc Gadavist COMPARISON:  04/30/2018 PET-CT.  Concurrent MRI of the head. FINDINGS: MRI THORACIC SPINE FINDINGS Alignment:  Physiologic. Vertebrae: T4 vertebral body and bilateral pedicle enhancing metastasis. Pathologic compression of the T4 vertebral body with 40% loss of vertebral body height. 5 mm of bony retropulsion of the T4 vertebral body with minimal contact on the anterior cord and mild canal stenosis. No cord compression. Surrounding paravertebral soft tissue thickening and enhancement  as well as enhancement in the anterior epidural space from mid T3 to mid T5. No additional enhancing lesion of the thoracic spine, compression deformity, or findings of discitis is identified. Cord:  Normal signal and morphology. Paraspinal and other soft tissues: As above. Multiple scattered pulmonary nodules and right lower lobe mass are stable given differences in technique in comparison with the prior PET-CT where they are better characterize. Disc levels: Retropulsion of the T4 vertebral body results in mild bilateral T4-5 neural foraminal stenosis and mild canal stenosis.  Mild multilevel disc desiccation and loss of intervertebral disc space height as well as small anterior marginal osteophytes. Prominent ligamentum flavum hypertrophy at the T9-10 level. No significant disc displacement, foraminal stenosis, or canal stenosis at additional levels. IMPRESSION: 1. T4 metastasis with 40% pathologic compression deformity and 5 mm of retropulsion of the vertebral body. Retropulsion results in mild spinal canal stenosis and mild bilateral C4-5 foraminal stenosis. 2. Paravertebral soft tissue thickening and enhancement as well as anterior epidural enhancement from mid T3 to mid T5 likely represents edema related to the pathologic compression deformity and possibly extra osseous extension of neoplasm. 3. No additional thoracic spine metastasis identified. 4. Multiple scattered pulmonary nodules and right lower lobe mass are stable given differences in technique in comparison with the prior PET-CT where they are better characterized. Electronically Signed   By: Kristine Garbe M.D.   On: 05/03/2018 22:57   Nm Pet Image Initial (pi) Skull Base To Thigh  Result Date: 04/30/2018 CLINICAL DATA:  Initial treatment strategy for pulmonary mass. EXAM: NUCLEAR MEDICINE PET SKULL BASE TO THIGH TECHNIQUE: 9.0 mCi F-18 FDG was injected intravenously. Full-ring PET imaging was performed from the skull base to thigh after the radiotracer. CT data was obtained and used for attenuation correction and anatomic localization. Fasting blood glucose: Not provided mg/dl COMPARISON:  04/27/2018 FINDINGS: Mediastinal blood pool activity: SUV max 2.2 NECK: No hypermetabolic lymph nodes in the neck. Incidental CT findings: none CHEST: Mass in the superior segment of the RIGHT lower lobe measures 3.4 cm with intense metabolic activity (SUV max 13.2) Bilateral smaller hypermetabolic pulmonary nodules. For example LEFT lobe pulmonary nodule measuring 11 mm with SUV max equal 4.5. Hypermetabolic RIGHT lower  paratracheal lymph node with SUV max equal 5.1. No hypermetabolic supraclavicular nodes or axillary nodes. Incidental CT findings: none ABDOMEN/PELVIS: No abnormal metabolic activity in the liver. No hypermetabolic adrenal glands. Thickening of the LEFT adrenal gland without hypermetabolic activity. This lesion has low attenuation consistent with benign adenoma. No hypermetabolic upper abdominal lymph nodes. No hypermetabolic retroperitoneal pelvic lymph nodes. Incidental CT findings: none SKELETON: Hypermetabolic lesion at the T4 vertebral body lesion level. There is lytic lesion on comparison CT. Activity is intense with SUV max equal 10.2. No additional skeletal metastasis identified. Incidental CT findings: none IMPRESSION: 1. Hypermetabolic RIGHT lower lobe mass consists with primary bronchogenic carcinoma. 2. Bilateral hypermetabolic pulmonary nodules consistent pulmonary metastasis. 3. Hypermetabolic mediastinal lymph nodes consistent with local nodal metastasis. 4. Hypermetabolic solitary metastasis the T4 vertebral body level. Electronically Signed   By: Suzy Bouchard M.D.   On: 04/30/2018 16:21    PERFORMANCE STATUS (ECOG) : 1 - Symptomatic but completely ambulatory  Review of Systems As noted above. Otherwise, a complete review of systems is negative.  Physical Exam General: NAD, frail appearing, thin Cardiovascular: regular rate and rhythm Pulmonary: clear ant fields Abdomen: soft, nontender, + bowel sounds GU: no suprapubic tenderness Extremities: no edema, no joint deformities, focal tenderness to mid spine  Skin: no rashes Neurological: Weakness but otherwise nonfocal  IMPRESSION: I met with patient today in the clinic.  He was accompanied by 3 friends.  Patient had multiple questions regarding his work-up and treatment of the cancer.  Patient is pending biopsy and then will follow up with Dr. Mike Gip.  I answered his questions to the best of my ability.  However, patient will  receive more information following the pathology results.Patient is interested in seeking treatment if available.    Symptomatically, patient has had worsening pain to the mid spine.  He stands on his feet all day due to his job.  He rates pain as severe. He reports that pain is impairing his daily functioning.  He is currently taking oxycodone 10 mg (two 5 mg tablets).  He says that this dose is not effective.  He denies any lethargy, confusion, or adverse effects from the opioids.  We discussed various pharmacological and nonpharmacological strategies for pain management including heat, massage, and acupuncture.  Will increase oxycodone to 10 to 20 mg every 6 hours.  Patient may ultimately benefit from addition of a long-acting opioid.  However, current dose of short acting medication is not yet efficacious.  Additionally, patient may benefit from scheduled acetaminophen and ibuprofen combination.  If pain remains intractable, would consider trial of steroids and/or adding a bisphosphonate.  Ultimately, the hope is that pain will improve once patient begins systemic treatment and/or radiation.  Once treatment begins, opioids hopefully will be weanable as patient tolerates.  We will follow-up with patient next week in the clinic.  We briefly discussed advance care planning today and will continue this conversation at a later date.  We reviewed resources available in the cancer center including the symptom management clinic.  Case discussed with Dr. Mike Gip.  PLAN: 1.  Increase oxycodone to 10-46m Q6H prn (Rx sent electronically to pharmacy) 2.  Scheduled actaminophen 5070mand ibuprofen 20011m6H 3.  Prophylactic bowel regimen with senna 1 tablet daily as needed 4.  RTC next week  Patient expressed understanding and was in agreement with this plan. He also understands that He can call clinic at any time with any questions, concerns, or complaints.   Time Total: 45 minutes  Visit consisted of  counseling and education dealing with the complex and emotionally intense issues of symptom management and palliative care in the setting of serious and potentially life-threatening illness.Greater than 50%  of this time was spent counseling and coordinating care related to the above assessment and plan.  Signed by: JosAltha HarmNPGahannaP-C, ACHO'[facility])

## 2018-05-09 ENCOUNTER — Other Ambulatory Visit: Payer: Self-pay | Admitting: Radiology

## 2018-05-10 ENCOUNTER — Ambulatory Visit: Payer: Medicare Other | Admitting: Nurse Practitioner

## 2018-05-10 ENCOUNTER — Inpatient Hospital Stay (HOSPITAL_BASED_OUTPATIENT_CLINIC_OR_DEPARTMENT_OTHER): Payer: Medicare Other | Admitting: Hospice and Palliative Medicine

## 2018-05-10 VITALS — BP 133/79 | HR 61 | Temp 96.8°F | Wt 172.4 lb

## 2018-05-10 DIAGNOSIS — C7951 Secondary malignant neoplasm of bone: Secondary | ICD-10-CM | POA: Diagnosis not present

## 2018-05-10 DIAGNOSIS — R918 Other nonspecific abnormal finding of lung field: Secondary | ICD-10-CM | POA: Diagnosis not present

## 2018-05-10 DIAGNOSIS — G893 Neoplasm related pain (acute) (chronic): Secondary | ICD-10-CM | POA: Diagnosis not present

## 2018-05-10 DIAGNOSIS — Z23 Encounter for immunization: Secondary | ICD-10-CM | POA: Diagnosis not present

## 2018-05-10 DIAGNOSIS — Z515 Encounter for palliative care: Secondary | ICD-10-CM | POA: Diagnosis not present

## 2018-05-10 DIAGNOSIS — C3491 Malignant neoplasm of unspecified part of right bronchus or lung: Secondary | ICD-10-CM | POA: Diagnosis not present

## 2018-05-10 NOTE — Progress Notes (Signed)
Jimmy West  Telephone:([phone] [phone]   Name: Jimmy West Date: 05/10/2018 MRN: [id number]  DOB: 08-Jan-1951  Patient Care Team: [name], NP as PCP - General (Nurse Practitioner) Telford Nab, RN as Registered Nurse [place], Gerda Diss, MD as Attending Physician (Emergency Medicine)    REASON FOR CONSULTATION: Palliative Care consult requested for this 67 y.o. male with multiple medical problems including presumed metastatic lung cancer.  Patient was noted to have pulmonary nodules on recent chest x-ray from PCP.  He was subsequently referred to oncology for work-up and saw Dr. Mike Gip on 04/30/2018.  PET scan revealed several hypermetabolic pulmonary nodules and activity within the T4 vertebral body.  Follow-up MRI of the spine revealed T4 metastasis with pathologic compression deformity and mild spinal canal stenosis with probable edema and extraosseous extension of neoplasm from mid T3 to mid T5.  Patient has had worsening pain to the thoracic spine requiring increased opioids.  Patient was referred today to the palliative care clinic to assist with symptom management and establishing treatment goals.   SOCIAL HISTORY:    Patient has not married and has no children.  He has a brother who lives out of state.  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  ADVANCE DIRECTIVES:  Does not have.  CODE STATUS: Full code  PAST MEDICAL HISTORY: Past Medical History:  Diagnosis Date  . Bulging lumbar disc   . Heart attack ([place])     PAST SURGICAL HISTORY:  Past Surgical History:  Procedure Laterality Date  . CARDIAC CATHETERIZATION    . KNEE SURGERY Left     HEMATOLOGY/ONCOLOGY HISTORY:   No history exists.    ALLERGIES:  has No Known Allergies.  MEDICATIONS:  Current Outpatient Medications  Medication Sig Dispense Refill  . aspirin EC 81 MG tablet Take 81 mg  by mouth daily.    Marland Kitchen docusate sodium (COLACE) 100 MG capsule Take 1 capsule (100 mg total) by mouth daily as needed. 30 capsule 2  . Oxycodone HCl 10 MG TABS Take 1-2 tablets (10-20 mg total) by mouth every 6 (six) hours as needed. 60 tablet 0   No current facility-administered medications for this visit.     VITAL SIGNS: BP 133/79 (BP Location: Left Arm, Patient Position: Sitting)   Pulse 61   Temp (!) 96.8 F (36 C) (Tympanic)   Wt 172 lb 6 oz (78.2 kg)   BMI 27.00 kg/m  Filed Weights   05/10/18 1502  Weight: 172 lb 6 oz (78.2 kg)    Estimated body mass index is 27 kg/m as calculated from the following:   Height as of 04/30/18: 5\' 7"  (1.702 m).   Weight as of this encounter: 172 lb 6 oz (78.2 kg).  LABS: CBC:    Component Value Date/Time   WBC 8.6 04/27/2018 1951   HGB 15.4 04/27/2018 1951   HGB 14.9 04/06/2014 0826   HCT 46.3 04/27/2018 1951   HCT 44.8 04/06/2014 0826   PLT 258 04/27/2018 1951   PLT 238 04/06/2014 0826   MCV 93.2 04/27/2018 1951   MCV 95 04/06/2014 0826   NEUTROABS 5.6 04/27/2018 1951   NEUTROABS 3.9 04/06/2014 0826   LYMPHSABS 1.8 04/27/2018 1951   LYMPHSABS 1.1 04/06/2014 0826   MONOABS 0.8 04/27/2018 1951   MONOABS 0.7 04/06/2014 0826   EOSABS 0.3 04/27/2018 1951   EOSABS 0.1 04/06/2014 0826   BASOSABS 0.1 04/27/2018  1951   BASOSABS 0.1 04/06/2014 0826   Comprehensive Metabolic Panel:    Component Value Date/Time   NA 141 04/27/2018 1951   NA 140 04/06/2014 0826   K 3.7 04/27/2018 1951   K 4.0 04/06/2014 0826   CL 103 04/27/2018 1951   CL 108 (H) 04/06/2014 0826   CO2 28 04/27/2018 1951   CO2 25 04/06/2014 0826   BUN 9 04/27/2018 1951   BUN 12 04/06/2014 0826   CREATININE 0.99 04/27/2018 1951   CREATININE 1.17 04/06/2014 0826   GLUCOSE 80 04/27/2018 1951   GLUCOSE 106 (H) 04/06/2014 0826   CALCIUM 9.5 04/27/2018 1951   CALCIUM 8.9 04/06/2014 0826   AST 29 04/27/2018 1951   AST 24 04/06/2014 0826   ALT 27 04/27/2018 1951    ALT 33 04/06/2014 0826   ALKPHOS 97 04/27/2018 1951   ALKPHOS 85 04/06/2014 0826   BILITOT 0.8 04/27/2018 1951   BILITOT 0.8 04/06/2014 0826   PROT 7.6 04/27/2018 1951   PROT 7.3 04/06/2014 0826   ALBUMIN 4.1 04/27/2018 1951   ALBUMIN 3.8 04/06/2014 0826    RADIOGRAPHIC STUDIES: Dg Chest 2 View  Result Date: 04/27/2018 CLINICAL DATA:  Persistent upper chest pain radiating around to the back. Patient completed a course of antibiotics and steroids for pneumonia. EXAM: CHEST - 2 VIEW COMPARISON:  04/14/2018 FINDINGS: Heart and mediastinal contours are stable. Persistent nodular opacities in both upper lobes with ill-defined parenchymal opacity in the infrahilar medial right thorax persist. Given lack of significant change despite antibiotics, differential considerations should include neoplasm. Degenerative changes are present along the dorsal spine. No definite osteolytic or blastic disease identified radiographically. IMPRESSION: No significant change in bilateral upper lobe nodular densities and right infrahilar masslike opacity of the lungs. Patient is scheduled for CT of the chest on Thursday. Electronically Signed   By: Ashley Royalty M.D.   On: 04/27/2018 20:29   Dg Chest 2 View  Result Date: 04/14/2018 CLINICAL DATA:  Postoperative chest. EXAM: CHEST - 2 VIEW COMPARISON:  Chest radiograph September 09, 2017 FINDINGS: New rounded consolidation project over RIGHT heart border on AP radiograph, difficult to localized on lateral radiograph. In addition, at least 2 and possibly 3 new pulmonary nodules. Cardiac silhouette size is normal. Coronary artery stent present. Mediastinal silhouette is not suspicious. No pleural effusion. No pneumothorax. Mild degenerative change of the thoracic spine. IMPRESSION: RIGHT middle lobe consolidation concerning for pneumonia. At least 2 new pulmonary nodules. Constellation of findings also seen with neoplasm. Recommend contrast enhanced CT chest. These results will  be called to the ordering clinician or representative by the Radiologist Assistant, and communication documented in the PACS or zVision Dashboard. Electronically Signed   By: Elon Alas M.D.   On: 04/14/2018 18:44   Ct Angio Chest Pe W And/or Wo Contrast  Result Date: 04/27/2018 CLINICAL DATA:  Patient is finishing trial of antibiotics and steroids for pneumonia. Follow-up with primary care physician yesterday and schedule for CT on Thursday but has persistent upper chest pain radiating into the back. EXAM: CT ANGIOGRAPHY CHEST WITH CONTRAST TECHNIQUE: Multidetector CT imaging of the chest was performed using the standard protocol during bolus administration of intravenous contrast. Multiplanar CT image reconstructions and MIPs were obtained to evaluate the vascular anatomy. CONTRAST:  153mL ISOVUE-370 IOPAMIDOL (ISOVUE-370) INJECTION 76% COMPARISON:  Chest radiograph 04/27/2018 FINDINGS: Cardiovascular: There is good opacification of the central and segmental pulmonary arteries. No focal filling defects. No evidence of significant pulmonary embolus. Normal heart size.  No pericardial effusion. Coronary artery calcifications. Normal caliber thoracic aorta with calcification. No aortic dissection. Mediastinum/Nodes: Small esophageal hiatal hernia. Esophagus is decompressed. Prominent right hilar lymph nodes measuring up to about 1.4 cm diameter. No pathologic mediastinal lymphadenopathy. Lungs/Pleura: Evaluation is limited due to motion artifact. Emphysematous changes throughout the lungs. There is a mass lesion in the superior segment right lower lobe posteriorly measuring 4.1 by 3.1 cm. This is causing obliteration of the in coming bronchus with mild postobstructive change inferiorly. Multiple bilateral smaller pulmonary nodules are demonstrated. These range in size from less than 1 cm up to a maximum of about 14 mm in diameter. No consolidation. No pleural effusions. No pneumothorax. Upper Abdomen: No  adrenal gland nodules. Small cyst in the upper pole of the right kidney. Spleen is incompletely imaged but appears enlarged. Musculoskeletal: Degenerative changes in the spine. Destructive lesion in the T4 vertebra with mild compression and adjacent soft tissue infiltration. No additional focal bone lesions are demonstrated. Review of the MIP images confirms the above findings. IMPRESSION: 1. No evidence of significant pulmonary embolus. 2. 4.1 cm mass in the superior segment right lower lobe posteriorly causing obliteration of the in coming bronchus and mild postobstructive change. Multiple additional bilateral pulmonary nodules. Enlarged right hilar lymph nodes. 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 3. 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  Mild associated compression. 4. Emphysematous changes in the lungs. Aortic Atherosclerosis (ICD10-I70.0) and Emphysema (ICD10-J43.9). Electronically Signed   By: Lucienne Capers M.D.   On: 04/27/2018 21:33   Mr Jeri Cos XB Contrast  Result Date: 05/03/2018 CLINICAL DATA:  67 y/o  M; lung cancer with T4 lesion on PET-CT. EXAM: MRI HEAD WITHOUT AND WITH CONTRAST TECHNIQUE: Multiplanar, multiecho pulse sequences of the brain and surrounding structures were obtained without and with intravenous contrast. CONTRAST:  7.5 cc Gadavist COMPARISON:  Concurrent MRI of the thoracic spine. 04/30/2018 PET-CT. FINDINGS: Brain: No acute infarction, hemorrhage, hydrocephalus, or extra-axial collection. There are 3 small chronic cortical infarctions within the left parietal lobe and chronic lacunar infarct within the right caudate head. Several nonspecific T2 FLAIR hyperintensities in subcortical and periventricular white matter are compatible with mild chronic microvascular ischemic changes for age. Mild volume loss of the brain. After administration of intravenous contrast there is no abnormal enhancement of the brain. No focal mass effect. Vascular: Normal flow voids.  Skull and upper cervical spine: Normal marrow signal. Sinuses/Orbits: Mild-to-moderate frontal, ethmoid, and sphenoid sinus mucosal thickening. No abnormal signal of the mastoid air cells. Orbits are unremarkable. Other: None. IMPRESSION: 1. No intracranial metastatic disease identified. 2. Mild chronic microvascular ischemic changes and volume loss of the brain. 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 3. Mild paranasal sinus disease. Electronically Signed   By: Kristine Garbe M.D.   On: 05/03/2018 22:44   Mr Thoracic Spine W Wo Contrast  Result Date: 05/03/2018 CLINICAL DATA:  67 y/o  M; lung cancer with T4 lesion on PET-CT. EXAM: MRI THORACIC WITHOUT AND WITH CONTRAST TECHNIQUE: Multiplanar and multiecho pulse sequences of the thoracic spine were obtained without and with intravenous contrast. CONTRAST:  7.5 cc Gadavist COMPARISON:  04/30/2018 PET-CT.  Concurrent MRI of the head. FINDINGS: MRI THORACIC SPINE FINDINGS Alignment:  Physiologic. Vertebrae: T4 vertebral body and bilateral pedicle enhancing metastasis. Pathologic compression of the T4 vertebral body with 40% loss of vertebral body height. 5 mm of bony retropulsion of the T4 vertebral body with  minimal contact on the anterior cord and mild canal stenosis. No cord compression. Surrounding paravertebral soft tissue thickening and enhancement as well as enhancement in the anterior epidural space from mid T3 to mid T5. No additional enhancing lesion of the thoracic spine, compression deformity, or findings of discitis is identified. Cord:  Normal signal and morphology. Paraspinal and other soft tissues: As above. Multiple scattered pulmonary nodules and right lower lobe mass are stable given differences in technique in comparison with the prior PET-CT where they are better characterize. Disc levels: Retropulsion of the T4 vertebral body results in mild bilateral  T4-5 neural foraminal stenosis and mild canal stenosis. Mild multilevel disc desiccation and loss of intervertebral disc space height as well as small anterior marginal osteophytes. Prominent ligamentum flavum hypertrophy at the T9-10 level. No significant disc displacement, foraminal stenosis, or canal stenosis at additional levels. IMPRESSION: 1. T4 metastasis with 40% pathologic compression deformity and 5 mm of retropulsion of the vertebral body. Retropulsion results in mild spinal canal stenosis and mild bilateral C4-5 foraminal stenosis. 2. Paravertebral soft tissue thickening and enhancement as well as anterior epidural enhancement from mid T3 to mid T5 likely represents edema related to the pathologic compression deformity and possibly extra osseous extension of neoplasm. 3. No additional thoracic spine metastasis identified. 4. Multiple scattered pulmonary nodules and right lower lobe mass are stable given differences in technique in comparison with the prior PET-CT where they are better characterized. Electronically Signed   By: Kristine Garbe M.D.   On: 05/03/2018 22:57   Nm Pet Image Initial (pi) Skull Base To Thigh  Result Date: 04/30/2018 CLINICAL DATA:  Initial treatment strategy for pulmonary mass. EXAM: NUCLEAR MEDICINE PET SKULL BASE TO THIGH TECHNIQUE: 9.0 mCi F-18 FDG was injected intravenously. Full-ring PET imaging was performed from the skull base to thigh after the radiotracer. CT data was obtained and used for attenuation correction and anatomic localization. Fasting blood glucose: Not provided mg/dl COMPARISON:  04/27/2018 FINDINGS: Mediastinal blood pool activity: SUV max 2.2 NECK: No hypermetabolic lymph nodes in the neck. Incidental CT findings: none CHEST: Mass in the superior segment of the RIGHT lower lobe measures 3.4 cm with intense metabolic activity (SUV max 13.2) Bilateral smaller hypermetabolic pulmonary nodules. For example LEFT lobe pulmonary nodule measuring 11  mm with SUV max equal 4.5. Hypermetabolic RIGHT lower paratracheal lymph node with SUV max equal 5.1. No hypermetabolic supraclavicular nodes or axillary nodes. Incidental CT findings: none ABDOMEN/PELVIS: No abnormal metabolic activity in the liver. No hypermetabolic adrenal glands. Thickening of the LEFT adrenal gland without hypermetabolic activity. This lesion has low attenuation consistent with benign adenoma. No hypermetabolic upper abdominal lymph nodes. No hypermetabolic retroperitoneal pelvic lymph nodes. Incidental CT findings: none SKELETON: Hypermetabolic lesion at the T4 vertebral body lesion level. There is lytic lesion on comparison CT. Activity is intense with SUV max equal 10.2. No additional skeletal metastasis identified. Incidental CT findings: none IMPRESSION: 1. Hypermetabolic RIGHT lower lobe mass consists with primary bronchogenic carcinoma. 2. Bilateral hypermetabolic pulmonary nodules consistent pulmonary metastasis. 3. Hypermetabolic mediastinal lymph nodes consistent with local nodal metastasis. 4. Hypermetabolic solitary metastasis the T4 vertebral body level. Electronically Signed   By: Suzy Bouchard M.D.   On: 04/30/2018 16:21    PERFORMANCE STATUS (ECOG) : 1 - Symptomatic but completely ambulatory  Review of Systems As noted above. Otherwise, a complete review of systems is negative.  Physical Exam General: NAD, frail appearing, thin Cardiovascular: regular rate and rhythm Pulmonary: clear ant fields Abdomen:  soft, nontender, + bowel sounds GU: no suprapubic tenderness Extremities: no edema, no joint deformities Skin: no rashes Neurological: Weakness but otherwise nonfocal  IMPRESSION: Follow-up visit today in the clinic.  Patient was accompanied by 2 friends.  Patient reports that he is significantly improved symptomatically.  He says pain is now tolerable.  He is taking 1 tablet (10 mg) of oxycodone 3-5 times per day.  Additionally, patient is taking  acetaminophen/ibuprofen combo 2-3 times a day.  He has had days when he has not required oxycodone.  He denies adverse effects from the opioids.  He denies current constipation.  He has been using Senokot every other day.  Patient is pending biopsy and then will meet with Dr. Mike Gip to develop treatment plan.  I reviewed with patient advance directive documents including healthcare power of attorney and living will.  We also reviewed a MOST form.  Patient says that he is interested in completing these documents at a later appointment.   PLAN: 1. Continue as needed oxycodone 2. Continue ibuprofen/acetaminophen combo 3.  Will plan to complete MOST Form at time of next visit 4.  RTC in 2 to 3 weeks.  We will try to coordinate next visit at time of appointment with Dr. Mike Gip.  Patient expressed understanding and was in agreement with this plan. He also understands that He can call clinic at any time with any questions, concerns, or complaints.   Time Total: 40 minutes  Visit consisted of counseling and education dealing with the complex and emotionally intense issues of symptom management and palliative care in the setting of serious and potentially life-threatening illness.Greater than 50%  of this time was spent counseling and coordinating care related to the above assessment and plan.  Signed by: [name], [place], NP-C, [place] ([employer])

## 2018-05-10 NOTE — Progress Notes (Signed)
Patient states he is feeling much better since starting Tylenol and Ibuprofen.  Still having right shoulder pain 4/10.

## 2018-05-11 ENCOUNTER — Ambulatory Visit
Admission: RE | Admit: 2018-05-11 | Discharge: 2018-05-11 | Disposition: A | Discharge status: Home / Self Care | Payer: Medicare Other | Source: Ambulatory Visit | Attending: Urgent Care | Admitting: Urgent Care

## 2018-05-11 ENCOUNTER — Ambulatory Visit
Admission: RE | Admit: 2018-05-11 | Discharge: 2018-05-11 | Disposition: A | Discharge status: Home / Self Care | Payer: Medicare Other | Source: Ambulatory Visit | Attending: Interventional Radiology | Admitting: Interventional Radiology

## 2018-05-11 ENCOUNTER — Other Ambulatory Visit: Payer: Self-pay

## 2018-05-11 DIAGNOSIS — J95811 Postprocedural pneumothorax: Secondary | ICD-10-CM | POA: Insufficient documentation

## 2018-05-11 DIAGNOSIS — R918 Other nonspecific abnormal finding of lung field: Secondary | ICD-10-CM | POA: Diagnosis not present

## 2018-05-11 DIAGNOSIS — I252 Old myocardial infarction: Secondary | ICD-10-CM | POA: Diagnosis not present

## 2018-05-11 DIAGNOSIS — Z01812 Encounter for preprocedural laboratory examination: Secondary | ICD-10-CM | POA: Diagnosis not present

## 2018-05-11 DIAGNOSIS — C7951 Secondary malignant neoplasm of bone: Secondary | ICD-10-CM | POA: Insufficient documentation

## 2018-05-11 DIAGNOSIS — C7801 Secondary malignant neoplasm of right lung: Secondary | ICD-10-CM | POA: Diagnosis not present

## 2018-05-11 DIAGNOSIS — Z87891 Personal history of nicotine dependence: Secondary | ICD-10-CM | POA: Insufficient documentation

## 2018-05-11 DIAGNOSIS — C3431 Malignant neoplasm of lower lobe, right bronchus or lung: Secondary | ICD-10-CM | POA: Diagnosis not present

## 2018-05-11 DIAGNOSIS — C3490 Malignant neoplasm of unspecified part of unspecified bronchus or lung: Secondary | ICD-10-CM | POA: Diagnosis not present

## 2018-05-11 DIAGNOSIS — Z791 Long term (current) use of non-steroidal anti-inflammatories (NSAID): Secondary | ICD-10-CM | POA: Diagnosis not present

## 2018-05-11 DIAGNOSIS — Z8249 Family history of ischemic heart disease and other diseases of the circulatory system: Secondary | ICD-10-CM | POA: Insufficient documentation

## 2018-05-11 DIAGNOSIS — Z79899 Other long term (current) drug therapy: Secondary | ICD-10-CM | POA: Diagnosis not present

## 2018-05-11 DIAGNOSIS — Z7982 Long term (current) use of aspirin: Secondary | ICD-10-CM | POA: Diagnosis not present

## 2018-05-11 LAB — CBC WITH DIFFERENTIAL/PLATELET
ABS IMMATURE GRANULOCYTES: 0.04 10*3/uL (ref 0.00–0.07)
BASOS ABS: 0.1 10*3/uL (ref 0.0–0.1)
BASOS PCT: 1 %
Eosinophils Absolute: 0.3 10*3/uL (ref 0.0–0.5)
Eosinophils Relative: 4 %
HCT: 43.6 % (ref 39.0–52.0)
HEMOGLOBIN: 14.5 g/dL (ref 13.0–17.0)
Immature Granulocytes: 1 %
LYMPHS PCT: 17 %
Lymphs Abs: 1.2 10*3/uL (ref 0.7–4.0)
MCH: 30.1 pg (ref 26.0–34.0)
MCHC: 33.3 g/dL (ref 30.0–36.0)
MCV: 90.6 fL (ref 80.0–100.0)
Monocytes Absolute: 0.7 10*3/uL (ref 0.1–1.0)
Monocytes Relative: 10 %
NEUTROS ABS: 4.7 10*3/uL (ref 1.7–7.7)
NRBC: 0 % (ref 0.0–0.2)
Neutrophils Relative %: 67 %
PLATELETS: 318 10*3/uL (ref 150–400)
RBC: 4.81 MIL/uL (ref 4.22–5.81)
RDW: 12 % (ref 11.5–15.5)
WBC: 6.9 10*3/uL (ref 4.0–10.5)

## 2018-05-11 LAB — PROTIME-INR
INR: 1.01
Prothrombin Time: 13.2 seconds (ref 11.4–15.2)

## 2018-05-11 MED ORDER — HYDROCODONE-ACETAMINOPHEN 5-325 MG PO TABS: 1.0000 | ORAL_TABLET | ORAL | Status: DC | PRN

## 2018-05-11 MED ORDER — FENTANYL CITRATE (PF) 100 MCG/2ML IJ SOLN
INTRAMUSCULAR | Status: AC
  Filled 2018-05-11: qty 4

## 2018-05-11 MED ORDER — FENTANYL CITRATE (PF) 100 MCG/2ML IJ SOLN
INTRAMUSCULAR | Status: AC | PRN
  Administered 2018-05-11 (×2): 50 ug via INTRAVENOUS

## 2018-05-11 MED ORDER — SODIUM CHLORIDE 0.9 % IV SOLN
INTRAVENOUS | Status: DC
  Administered 2018-05-11: 09:00:00 via INTRAVENOUS

## 2018-05-11 MED ORDER — MIDAZOLAM HCL 2 MG/2ML IJ SOLN
INTRAMUSCULAR | Status: AC | PRN
  Administered 2018-05-11 (×3): 1 mg via INTRAVENOUS

## 2018-05-11 MED ORDER — LIDOCAINE HCL (PF) 1 % IJ SOLN
INTRAMUSCULAR | Status: AC | PRN
  Administered 2018-05-11: 20 mL

## 2018-05-11 MED ORDER — MIDAZOLAM HCL 5 MG/5ML IJ SOLN
INTRAMUSCULAR | Status: AC
  Filled 2018-05-11: qty 5

## 2018-05-11 NOTE — Procedures (Signed)
Interventional Radiology Procedure Note  Procedure: CT guided core biopsy of RLL lung mass  Complications: None  Estimated Blood Loss: < 10 mL  Findings: 18 G core biopsy x 2 via 17 G needle.  No complications.  Venetia Night. Kathlene Cote, M.D Pager:  [phone]

## 2018-05-11 NOTE — H&P (Signed)
Chief Complaint: Patient was seen in consultation today for lung mass biopsy at the request of Honor Loh, NP and Nolon Stalls, MD  Referring Physician(s): Nolon Stalls  Patient Status: [facility] - Out-pt  History of Present Illness: Jimmy West is a 67 y.o. male presenting with a 4 cm RLL lung mass, smaller bilateral nodules, right paratracheal lymphadenopathy and a T4 metastasis.  Now presenting for CT guided biopsy of RLL lung mass.  Back pain now improved after pain medication.  No other symptoms.  Past Medical History:  Diagnosis Date  . Bulging lumbar disc   . Heart attack [facility])     Past Surgical History:  Procedure Laterality Date  . CARDIAC CATHETERIZATION     two stents  . KNEE SURGERY Left     Allergies: Patient has no known allergies.  Medications: Prior to Admission medications   Medication Sig Start Date End Date Taking? Authorizing Provider  acetaminophen (TYLENOL) 500 MG tablet Take 500 mg by mouth 3 (three) times daily as needed.   Yes [provider]  aspirin EC 81 MG tablet Take 81 mg by mouth daily.   Yes [provider]  ibuprofen (ADVIL,MOTRIN) 200 MG tablet Take 200 mg by mouth every 6 (six) hours as needed.   Yes [provider]  Oxycodone HCl 10 MG TABS Take 1-2 tablets (10-20 mg total) by mouth every 6 (six) hours as needed. 05/04/18  Yes Borders, Kirt Boys, NP  polyethylene glycol (MIRALAX / GLYCOLAX) packet Take 17 g by mouth daily.   Yes [provider]  docusate sodium (COLACE) 100 MG capsule Take 1 capsule (100 mg total) by mouth daily as needed. Patient not taking: Reported on 05/11/2018 04/27/18 04/27/19  Schuyler Amor, MD     Family History  Problem Relation Age of Onset  . Heart failure Father   . Cancer Maternal Aunt   . Heart failure Maternal Uncle   . Dementia Paternal Grandmother   . Cancer Maternal Aunt     Social History   Socioeconomic History  . Marital status: Single      Spouse name: Not on file  . Number of children: Not on file  . Years of education: Not on file  . Highest education level: Not on file  Occupational History  . Not on file  Social Needs  . Financial resource strain: Not on file  . Food insecurity:    Worry: Not on file    Inability: Not on file  . Transportation needs:    Medical: Not on file    Non-medical: Not on file  Tobacco Use  . Smoking status: Former Smoker    Packs/day: 1.50    Years: 15.00    Pack years: 22.50    Types: Cigarettes    Last attempt to quit: 11/03/2003    Years since quitting: 14.5  . Smokeless tobacco: Never Used  Substance and Sexual Activity  . Alcohol use: Yes    Alcohol/week: 15.0 standard drinks    Types: 15 Shots of liquor per week    Comment: occasionally  . Drug use: No  . Sexual activity: Not Currently  Lifestyle  . Physical activity:    Days per week: Not on file    Minutes per session: Not on file  . Stress: Not on file  Relationships  . Social connections:    Talks on phone: Not on file    Gets together: Not on file    Attends religious service:  Not on file    Active member of club or organization: Not on file    Attends meetings of clubs or organizations: Not on file    Relationship status: Not on file  Other Topics Concern  . Not on file  Social History Narrative  . Not on file    ECOG Status: 1 - Symptomatic but completely ambulatory  Review of Systems: A 12 point ROS discussed and pertinent positives are indicated in the HPI above.  All other systems are negative.  Review of Systems  Constitutional: Negative.   HENT: Negative.   Respiratory: Negative.   Cardiovascular: Negative.   Gastrointestinal: Negative.   Genitourinary: Negative.   Musculoskeletal: Positive for back pain.  Neurological: Negative.     Vital Signs: BP (!) 145/89   Pulse 69   Temp 98 F (36.7 C) (Oral)   Resp 17   Ht 5\' 7"  (1.702 m)   Wt 77.6 kg   SpO2 97%   BMI 26.78 kg/m    Physical Exam  Constitutional: He is oriented to person, place, and time. He appears well-developed and well-nourished. No distress.  HENT:  Head: Normocephalic and atraumatic.  Neck: Neck supple. No JVD present. No tracheal deviation present.  Cardiovascular: Normal rate, regular rhythm and normal heart sounds. Exam reveals no gallop and no friction rub.  No murmur heard. Pulmonary/Chest: Effort normal and breath sounds normal. No stridor. No respiratory distress. He has no wheezes. He has no rales.  Abdominal: Soft. Bowel sounds are normal. He exhibits no distension and no mass. There is no tenderness. There is no rebound and no guarding.  Musculoskeletal: He exhibits no edema.  Lymphadenopathy:    He has no cervical adenopathy.  Neurological: He is alert and oriented to person, place, and time.  Skin: Skin is warm and dry. He is not diaphoretic.  Vitals reviewed.   Imaging: Dg Chest 2 View  Result Date: 04/27/2018 CLINICAL DATA:  Persistent upper chest pain radiating around to the back. Patient completed a course of antibiotics and steroids for pneumonia. EXAM: CHEST - 2 VIEW COMPARISON:  04/14/2018 FINDINGS: Heart and mediastinal contours are stable. Persistent nodular opacities in both upper lobes with ill-defined parenchymal opacity in the infrahilar medial right thorax persist. Given lack of significant change despite antibiotics, differential considerations should include neoplasm. Degenerative changes are present along the dorsal spine. No definite osteolytic or blastic disease identified radiographically. IMPRESSION: No significant change in bilateral upper lobe nodular densities and right infrahilar masslike opacity of the lungs. Patient is scheduled for CT of the chest on Thursday. Electronically Signed   By: Ashley Royalty M.D.   On: 04/27/2018 20:29   Dg Chest 2 View  Result Date: 04/14/2018 CLINICAL DATA:  Postoperative chest. EXAM: CHEST - 2 VIEW COMPARISON:  Chest radiograph  September 09, 2017 FINDINGS: New rounded consolidation project over RIGHT heart border on AP radiograph, difficult to localized on lateral radiograph. In addition, at least 2 and possibly 3 new pulmonary nodules. Cardiac silhouette size is normal. Coronary artery stent present. Mediastinal silhouette is not suspicious. No pleural effusion. No pneumothorax. Mild degenerative change of the thoracic spine. IMPRESSION: RIGHT middle lobe consolidation concerning for pneumonia. At least 2 new pulmonary nodules. Constellation of findings also seen with neoplasm. Recommend contrast enhanced CT chest. These results will be called to the ordering clinician or representative by the Radiologist Assistant, and communication documented in the PACS or zVision Dashboard. Electronically Signed   By: Thana Farr.D.  On: 04/14/2018 18:44   Ct Angio Chest Pe W And/or Wo Contrast  Result Date: 04/27/2018 CLINICAL DATA:  Patient is finishing trial of antibiotics and steroids for pneumonia. Follow-up with primary care physician yesterday and schedule for CT on Thursday but has persistent upper chest pain radiating into the back. EXAM: CT ANGIOGRAPHY CHEST WITH CONTRAST TECHNIQUE: Multidetector CT imaging of the chest was performed using the standard protocol during bolus administration of intravenous contrast. Multiplanar CT image reconstructions and MIPs were obtained to evaluate the vascular anatomy. CONTRAST:  17mL ISOVUE-370 IOPAMIDOL (ISOVUE-370) INJECTION 76% COMPARISON:  Chest radiograph 04/27/2018 FINDINGS: Cardiovascular: There is good opacification of the central and segmental pulmonary arteries. No focal filling defects. No evidence of significant pulmonary embolus. Normal heart size. No pericardial effusion. Coronary artery calcifications. Normal caliber thoracic aorta with calcification. No aortic dissection. Mediastinum/Nodes: Small esophageal hiatal hernia. Esophagus is decompressed. Prominent right hilar lymph  nodes measuring up to about 1.4 cm diameter. No pathologic mediastinal lymphadenopathy. Lungs/Pleura: Evaluation is limited due to motion artifact. Emphysematous changes throughout the lungs. There is a mass lesion in the superior segment right lower lobe posteriorly measuring 4.1 by 3.1 cm. This is causing obliteration of the in coming bronchus with mild postobstructive change inferiorly. Multiple bilateral smaller pulmonary nodules are demonstrated. These range in size from less than 1 cm up to a maximum of about 14 mm in diameter. No consolidation. No pleural effusions. No pneumothorax. Upper Abdomen: No adrenal gland nodules. Small cyst in the upper pole of the right kidney. Spleen is incompletely imaged but appears enlarged. Musculoskeletal: Degenerative changes in the spine. Destructive lesion in the T4 vertebra with mild compression and adjacent soft tissue infiltration. No additional focal bone lesions are demonstrated. Review of the MIP images confirms the above findings. IMPRESSION: 1. No evidence of significant pulmonary embolus. 2. 4.1 cm mass in the superior segment right lower lobe posteriorly causing obliteration of the in coming bronchus and mild postobstructive change. Multiple additional bilateral pulmonary nodules. Enlarged right hilar lymph nodes. 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 3. 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  Mild associated compression. 4. Emphysematous changes in the lungs. Aortic Atherosclerosis (ICD10-I70.0) and Emphysema (ICD10-J43.9). Electronically Signed   By: Lucienne Capers M.D.   On: 04/27/2018 21:33   Mr Jeri Cos AL Contrast  Result Date: 05/03/2018 CLINICAL DATA:  67 y/o  M; lung cancer with T4 lesion on PET-CT. EXAM: MRI HEAD WITHOUT AND WITH CONTRAST TECHNIQUE: Multiplanar, multiecho pulse sequences of the brain and surrounding structures were obtained without and with intravenous contrast. CONTRAST:  7.5 cc Gadavist COMPARISON:  Concurrent  MRI of the thoracic spine. 04/30/2018 PET-CT. FINDINGS: Brain: No acute infarction, hemorrhage, hydrocephalus, or extra-axial collection. There are 3 small chronic cortical infarctions within the left parietal lobe and chronic lacunar infarct within the right caudate head. Several nonspecific T2 FLAIR hyperintensities in subcortical and periventricular white matter are compatible with mild chronic microvascular ischemic changes for age. Mild volume loss of the brain. After administration of intravenous contrast there is no abnormal enhancement of the brain. No focal mass effect. Vascular: Normal flow voids. Skull and upper cervical spine: Normal marrow signal. Sinuses/Orbits: Mild-to-moderate frontal, ethmoid, and sphenoid sinus mucosal thickening. No abnormal signal of the mastoid air cells. Orbits are unremarkable. Other: None. IMPRESSION: 1. No intracranial metastatic disease identified. 2. Mild chronic microvascular ischemic changes and volume loss of the brain. 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 3. Mild paranasal sinus disease. Electronically Signed   By: Kristine Garbe M.D.   On: 05/03/2018 22:44   Mr Thoracic Spine W Wo Contrast  Result Date: 05/03/2018 CLINICAL DATA:  67 y/o  M; lung cancer with T4 lesion on PET-CT. EXAM: MRI THORACIC WITHOUT AND WITH CONTRAST TECHNIQUE: Multiplanar and multiecho pulse sequences of the thoracic spine were obtained without and with intravenous contrast. CONTRAST:  7.5 cc Gadavist COMPARISON:  04/30/2018 PET-CT.  Concurrent MRI of the head. FINDINGS: MRI THORACIC SPINE FINDINGS Alignment:  Physiologic. Vertebrae: T4 vertebral body and bilateral pedicle enhancing metastasis. Pathologic compression of the T4 vertebral body with 40% loss of vertebral body height. 5 mm of bony retropulsion of the T4 vertebral body with minimal contact on the anterior cord and mild canal stenosis. No  cord compression. Surrounding paravertebral soft tissue thickening and enhancement as well as enhancement in the anterior epidural space from mid T3 to mid T5. No additional enhancing lesion of the thoracic spine, compression deformity, or findings of discitis is identified. Cord:  Normal signal and morphology. Paraspinal and other soft tissues: As above. Multiple scattered pulmonary nodules and right lower lobe mass are stable given differences in technique in comparison with the prior PET-CT where they are better characterize. Disc levels: Retropulsion of the T4 vertebral body results in mild bilateral T4-5 neural foraminal stenosis and mild canal stenosis. Mild multilevel disc desiccation and loss of intervertebral disc space height as well as small anterior marginal osteophytes. Prominent ligamentum flavum hypertrophy at the T9-10 level. No significant disc displacement, foraminal stenosis, or canal stenosis at additional levels. IMPRESSION: 1. T4 metastasis with 40% pathologic compression deformity and 5 mm of retropulsion of the vertebral body. Retropulsion results in mild spinal canal stenosis and mild bilateral C4-5 foraminal stenosis. 2. Paravertebral soft tissue thickening and enhancement as well as anterior epidural enhancement from mid T3 to mid T5 likely represents edema related to the pathologic compression deformity and possibly extra osseous extension of neoplasm. 3. No additional thoracic spine metastasis identified. 4. Multiple scattered pulmonary nodules and right lower lobe mass are stable given differences in technique in comparison with the prior PET-CT where they are better characterized. Electronically Signed   By: Kristine Garbe M.D.   On: 05/03/2018 22:57   Nm Pet Image Initial (pi) Skull Base To Thigh  Result Date: 04/30/2018 CLINICAL DATA:  Initial treatment strategy for pulmonary mass. EXAM: NUCLEAR MEDICINE PET SKULL BASE TO THIGH TECHNIQUE: 9.0 mCi F-18 FDG was injected  intravenously. Full-ring PET imaging was performed from the skull base to thigh after the radiotracer. CT data was obtained and used for attenuation correction and anatomic localization. Fasting blood glucose: Not provided mg/dl COMPARISON:  04/27/2018 FINDINGS: Mediastinal blood pool activity: SUV max 2.2 NECK: No hypermetabolic lymph nodes in the neck. Incidental CT findings: none CHEST: Mass in the superior segment of the RIGHT lower lobe measures 3.4 cm with intense metabolic activity (SUV max 13.2) Bilateral smaller hypermetabolic pulmonary nodules. For example LEFT lobe pulmonary nodule measuring 11 mm with SUV max equal 4.5. Hypermetabolic RIGHT lower paratracheal lymph node with SUV max equal 5.1. No hypermetabolic supraclavicular nodes or axillary nodes. Incidental CT findings: none ABDOMEN/PELVIS: No abnormal metabolic activity in the liver. No hypermetabolic adrenal glands. Thickening of the LEFT adrenal gland without hypermetabolic activity. This lesion has low attenuation consistent with benign adenoma. No hypermetabolic upper abdominal lymph nodes. No hypermetabolic retroperitoneal pelvic lymph nodes. Incidental CT findings: none SKELETON: Hypermetabolic lesion at  the T4 vertebral body lesion level. There is lytic lesion on comparison CT. Activity is intense with SUV max equal 10.2. No additional skeletal metastasis identified. Incidental CT findings: none IMPRESSION: 1. Hypermetabolic RIGHT lower lobe mass consists with primary bronchogenic carcinoma. 2. Bilateral hypermetabolic pulmonary nodules consistent pulmonary metastasis. 3. Hypermetabolic mediastinal lymph nodes consistent with local nodal metastasis. 4. Hypermetabolic solitary metastasis the T4 vertebral body level. Electronically Signed   By: Suzy Bouchard M.D.   On: 04/30/2018 16:21    Labs:  CBC: Recent Labs    04/27/18 1951 05/11/18 0828  WBC 8.6 6.9  HGB 15.4 14.5  HCT 46.3 43.6  PLT 258 318    COAGS: Recent Labs     04/30/18 1647 05/11/18 0828  INR 0.94 1.01    BMP: Recent Labs    04/27/18 1951  NA 141  K 3.7  CL 103  CO2 28  GLUCOSE 80  BUN 9  CALCIUM 9.5  CREATININE 0.99  GFRNONAA >60  GFRAA >60    LIVER FUNCTION TESTS: Recent Labs    04/27/18 1951  BILITOT 0.8  AST 29  ALT 27  ALKPHOS 97  PROT 7.6  ALBUMIN 4.1    TUMOR MARKERS: No results for input(s): AFPTM, CEA, CA199, CHROMGRNA in the last 8760 hours.  Assessment and Plan:  For biopsy of dominant, 4 cm RLL lung mass today under CT guidance. Risks and benefits discussed with the patient including, but not limited to bleeding, hemoptysis, respiratory failure requiring intubation, infection, pneumothorax requiring chest tube placement, stroke from air embolism or even death. All of the patient's questions were answered, patient is agreeable to proceed. Consent signed and in chart.  Thank you for this interesting consult.  I greatly enjoyed meeting Curtiss Mahmood and look forward to participating in their care.  A copy of this report was sent to the requesting provider on this date.  Electronically Signed: Azzie Roup, MD 05/11/2018, 9:35 AM   I spent a total of 30 Minutes in face to face in clinical consultation, greater than 50% of which was counseling/coordinating care for RLL lung mass biopsy.

## 2018-05-11 NOTE — Progress Notes (Signed)
Cleared with MD post Chest xray for discharge at 12:30pm

## 2018-05-11 NOTE — Discharge Instructions (Signed)
Lung Biopsy, Care After This sheet gives you information about how to care for yourself after your procedure. Your health care provider may also give you more specific instructions depending on the type of biopsy you had. If you have problems or questions, contact your health care provider. What can I expect after the procedure? After the procedure, it is common to have:  A cough.  A sore throat.  Pain where a needle, bronchoscope, or incision was used to collect a biopsy sample (biopsy site).  You may cough up a small amount of bloody sputum for up to several days after your biopsy.  If you cough up more than a TBSP of blood come to the emergency room by EMS.   Follow these instructions at home: Medicines  Take over-the-counter and prescription medicines only as told by your health care provider.  Do not drive for 24 hours if you were given a sedative.  Do not drink alcohol while taking pain medicine.  Do not drive or use heavy machinery while taking prescription pain medicine.  To prevent or treat constipation while you are taking prescription pain medicine, your health care provider may recommend that you: ? Drink enough fluid to keep your urine clear or pale yellow. ? Take over-the-counter or prescription medicines. ? Eat foods that are high in fiber, such as fresh fruits and vegetables, whole grains, and beans. ? Limit foods that are high in fat and processed sugars, such as fried and sweet foods. Activity  If you had an incision during your procedure, avoid activities that may pull the incision site open.  Return to your normal activities tomorrow.   You may shower tomorrow leave band aid on site and pat area dry. Change your band aid after your shower.  You may remove band aid the following day.   Do not scrub or rub your biopsy site or area surrounding it.  If you had an open biopsy:   Follow instructions from your health care provider about how to take care of your  incision. Make sure you: ? Wash your hands with soap and water before you change your bandage (dressing). If soap and water are not available, use hand sanitizer. ? Remove your dressing after 24 hours ? Leave stitches (sutures), skin glue, or adhesive strips in place. These skin closures may need to stay in place for 2 weeks or longer. If adhesive strip edges start to loosen and curl up, you may trim the loose edges. Do not remove adhesive strips completely unless your health care provider tells you to do that.  Check your incision area every day for signs of infection. Check for: ? Redness, swelling, or pain. ? Fluid or blood. ? Warmth. ? Pus or a bad smell. General instructions  It is up to you to get the results of your procedure. Ask your health care provider, or the department that is doing the procedure, when your results will be ready. Contact a health care provider if:  You have a fever.  You have redness, swelling, or pain around your biopsy site.  You have fluid or blood coming from your biopsy site.  Your biopsy site feels warm to the touch.  You have pus or a bad smell coming from your biopsy site. Get help right away if:  You cough up blood.  You have trouble breathing.  You have chest pain. Summary  After the procedure, it is common to have a sore throat and a cough.  Return to  your normal activities as told by your health care provider. Ask your health care provider what activities are safe for you.  Take over-the-counter and prescription medicines only as told by your health care provider.  Report any unusual symptoms to your health care provider. This information is not intended to replace advice given to you by your health care provider. Make sure you discuss any questions you have with your health care provider. Document Released: 08/05/2016 Document Revised: 08/05/2016 Document Reviewed: 08/05/2016 Elsevier Interactive Patient Education  [employer].

## 2018-05-13 ENCOUNTER — Other Ambulatory Visit: Payer: Self-pay | Admitting: *Deleted

## 2018-05-13 LAB — SURGICAL PATHOLOGY

## 2018-05-13 MED ORDER — OXYCODONE HCL 10 MG PO TABS: 10.0000 mg | ORAL_TABLET | Freq: Four times a day (QID) | ORAL | 0 refills | Status: DC | PRN

## 2018-05-13 NOTE — Telephone Encounter (Signed)
Refill request for oxycodone.  

## 2018-05-14 ENCOUNTER — Inpatient Hospital Stay: Payer: Medicare Other | Admitting: Hematology and Oncology

## 2018-05-16 NOTE — Progress Notes (Signed)
[facility] day:  05/17/2018  Chief Complaint: [name] is a 67 y.o. male with a right lung mass and a T4 metastasis who is seen for a 2-week assessment to discuss direction of therapy.  HPI: Patient was last seen in the medical oncology clinic on 04/30/2018 for an initial consultation.  At that time, patient complained of significant pain in his upper back with intermittent radiation to the chest.  He was using Percocet 5/325 mg PRN (3 pills/day).  Patient denied increased shortness of breath.  No significant B symptoms.  He also complained of numbness in his LEFT heel related to bulging disc in his lumbosacral spine.  Appetite stable, with no significant weight loss.  Exam revealed no lymphadenopathy or hepatosplenomegaly.  He was grossly neurologically intact.  Pain medication changed to oxycodone.  PT/INR was collected on 04/30/2018 in preparation for lung biopsy.  INR normal at 0.94.  MRI imaging of the thoracic spine on 05/03/2018 revealed T4 metastasis with a 40% pathologic compression deformity and 5 mm of retropulsion of the vertebral body.  Retropulsion resulted in mild spinal canal stenosis and mild bilateral C4-5 foraminal stenosis.  There was paravertebral soft tissue thickening from mid T3 to mid T5, likely representing edema related to the pathologic compression deformity vs. possible extraosseous extension of the neoplasm.  There were no additional thoracic spinal metastases noted.   MRI imaging of the brain on 05/03/2018 revealed no intracranial metastatic disease.  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  Incidental mention made of mild paranasal sinus disease.  Patient was seen in consult by [name], NP (palliative medicine) on 05/04/2018.  Notes  reviewed.  Patient with increasing pain to his mid thoracic spine.  Patient was taking Roxicodone 10 mg every 6 hours as needed for pain, however he noted that it was not effective.  Complementary methods were discussed (heat, massage, and acupuncture).  Roxicodone was uptitrated to 20 mg every 6 hours as needed, in addition to APAP 500 mg and IBU 200 mg scheduled every 6 hours.  He was started on a prophylactic bowel regimen using senna.  Patient to follow-up in 1 week.  Patient was seen in follow-up on 05/10/2018 by [name], NP (palliative medicine).  Notes reviewed.  Pain had significantly improved and was described as "tolerable".  Patient was using Roxicodone 10 mg 3-5 times a day.  Additionally, he was using the APAP/IBU combo 2-3 times a day.  He noted that there were days where he had not required the use of the Roxicodone.  Patient taking Senokot every other day to prevent opioid-induced constipation.  Introduced MOST form, however patient elected to defer.  Patient will follow up with palliative medicine provider in 2 to 3 weeks.  He underwent CT-guided biopsy of a RLL lung mass on 05/11/2018 with Dr. Aletta Edouard (interventional radiology).  Procedure notes reviewed.  There were no procedural complications noted. Pathology revealed an invasive high-grade adenocarcinoma, with predominantly solid growth pattern.  The neoplastic cells were TTF-1 (+), Napsin A (+), and P40 (+).  In the interim, patient is doing well overall. He has been more fatigued as of late. He notes that his pain intensifies with prolonged standing. Pain is managed on Roxicodone 10-20 mg every 4-6 hours. He is currently using on average 7-8 pills/day. He  denies shortness of breath and episodes of chest pain. Patient denies that he has experienced any B symptoms. He denies any interval infections.   Patient advises that he maintains an adequate appetite. He is eating well. Weight today is 167 lb (75.8 kg), which compared  to his last visit to the clinic, represents a 4 pound decrease.    Patient complains of pain rated 5/10 in the clinic today.   Past Medical History:  Diagnosis Date  . Bulging lumbar disc   . Heart attack [facility])     Past Surgical History:  Procedure Laterality Date  . CARDIAC CATHETERIZATION     two stents  . KNEE SURGERY Left     Family History  Problem Relation Age of Onset  . Heart failure Father   . Cancer Maternal Aunt   . Heart failure Maternal Uncle   . Dementia Paternal Grandmother   . Cancer Maternal Aunt     Social History:  reports that he quit smoking about 14 years ago. His smoking use included cigarettes. He has a 22.50 pack-year smoking history. He has never used smokeless tobacco. He reports that he drinks about 15.0 standard drinks of alcohol per week. He reports that he does not use drugs. He started smoking at age 74.   He is smoking 1 1/2 packs/day.  Patient is employed as as full time Probation officer. Patient denies known exposures to radiation on toxins. The patient is accompanied by 2 friends ([name] and [place]) today, and [place] (Engineer, site) today.  Allergies: No Known Allergies  Current Medications: Current Outpatient Medications  Medication Sig Dispense Refill  . aspirin EC 81 MG tablet Take 81 mg by mouth daily.    . Oxycodone HCl 10 MG TABS Take 1-2 tablets (10-20 mg total) by mouth every 6 (six) hours as needed. 60 tablet 0  . acetaminophen (TYLENOL) 500 MG tablet Take 500 mg by mouth 3 (three) times daily as needed.    . docusate sodium (COLACE) 100 MG capsule Take 1 capsule (100 mg total) by mouth daily as needed. (Patient not taking: Reported on 05/11/2018) 30 capsule 2  . ibuprofen (ADVIL,MOTRIN) 200 MG tablet Take 200 mg by mouth every 6 (six) hours as needed.    . polyethylene glycol (MIRALAX / GLYCOLAX) packet Take 17 g by mouth daily.     No current facility-administered medications for this visit.     Review of Systems   Constitutional: Positive for malaise/fatigue and weight loss (down 4 pounds). Negative for diaphoresis and fever.  HENT: Negative.   Eyes: Negative.   Respiratory: Negative for cough, hemoptysis, sputum production and shortness of breath.   Cardiovascular: Negative for chest pain, palpitations, orthopnea, leg swelling and PND.  Gastrointestinal: Negative for abdominal pain, blood in stool, constipation, diarrhea, melena, nausea and vomiting.  Genitourinary: Negative for dysuria, frequency, hematuria and urgency.  Musculoskeletal: Positive for back pain (localized to T4 area; associated with osseous metastasis). Negative for falls, joint pain and myalgias.       Known bulging disc in lumbosacral spine  Skin: Negative for itching and rash.  Neurological: Positive for sensory change (chronic numbness to LEFT heel). Negative for dizziness, tremors, weakness and headaches.  Endo/Heme/Allergies: Does not bruise/bleed easily.  Psychiatric/Behavioral: Negative for depression, memory loss and suicidal ideas. The patient is not nervous/anxious and does not have insomnia.   All other systems reviewed and are negative.  Performance status (ECOG): 1 - Symptomatic but completely ambulatory  Vital Signs BP Marland Kitchen)  158/78 (BP Location: Left Arm, Patient Position: Sitting)   Pulse 78   Temp 97.9 F (36.6 C) (Tympanic)   Resp 18   Ht _0  (1.702 m)   Wt 167 lb (75.8 kg)   SpO2 97%   BMI 26.16 kg/m   Physical Exam  Constitutional: He is oriented to person, place, and time and well-developed, well-nourished, and in no distress.  HENT:  Head: Normocephalic and atraumatic.  Mouth/Throat: Oropharynx is clear and moist and mucous membranes are normal.  Gray hair and mustache  Eyes: Pupils are equal, round, and reactive to light. EOM are normal. No scleral icterus.  Glasses.  Blue eyes.  Neck: Normal range of motion. Neck supple. No tracheal deviation present. No thyromegaly present.  Cardiovascular:  Normal rate, regular rhythm, normal heart sounds and intact distal pulses. Exam reveals no gallop and no friction rub.  No murmur heard. Pulmonary/Chest: Effort normal and breath sounds normal. No respiratory distress. He has no wheezes. He has no rales.  Abdominal: Soft. Bowel sounds are normal. He exhibits no distension. There is no tenderness.  Musculoskeletal: Normal range of motion. He exhibits no edema.       Thoracic back: He exhibits tenderness and pain.  Lymphadenopathy:    He has no cervical adenopathy.    He has no axillary adenopathy.       Right: No inguinal and no supraclavicular adenopathy present.       Left: No inguinal and no supraclavicular adenopathy present.  Neurological: He is alert and oriented to person, place, and time.  Skin: Skin is warm and dry. No rash noted. No erythema.  Psychiatric: Mood, affect and judgment normal.  Nursing note and vitals reviewed.   No visits with results within 3 Day(s) from this visit.  Latest known visit with results is:  Hospital Outpatient Visit on 05/11/2018  Component Date Value Ref Range Status  . WBC 05/11/2018 6.9  4.0 - 10.5 K/uL Final  . RBC 05/11/2018 4.81  4.22 - 5.81 MIL/uL Final  . Hemoglobin 05/11/2018 14.5  13.0 - 17.0 g/dL Final  . HCT 05/11/2018 43.6  39.0 - 52.0 % Final  . MCV 05/11/2018 90.6  80.0 - 100.0 fL Final  . MCH 05/11/2018 30.1  26.0 - 34.0 pg Final  . MCHC 05/11/2018 33.3  30.0 - 36.0 g/dL Final  . RDW 05/11/2018 12.0  11.5 - 15.5 % Final  . Platelets 05/11/2018 318  150 - 400 K/uL Final  . nRBC 05/11/2018 0.0  0.0 - 0.2 % Final  . Neutrophils Relative % 05/11/2018 67  % Final  . Neutro Abs 05/11/2018 4.7  1.7 - 7.7 K/uL Final  . Lymphocytes Relative 05/11/2018 17  % Final  . Lymphs Abs 05/11/2018 1.2  0.7 - 4.0 K/uL Final  . Monocytes Relative 05/11/2018 10  % Final  . Monocytes Absolute 05/11/2018 0.7  0.1 - 1.0 K/uL Final  . Eosinophils Relative 05/11/2018 4  % Final  . Eosinophils Absolute  05/11/2018 0.3  0.0 - 0.5 K/uL Final  . Basophils Relative 05/11/2018 1  % Final  . Basophils Absolute 05/11/2018 0.1  0.0 - 0.1 K/uL Final  . Immature Granulocytes 05/11/2018 1  % Final  . Abs Immature Granulocytes 05/11/2018 0.04  0.00 - 0.07 K/uL Final   Performed at [facility], [address], [place], [place] [address]  . Prothrombin Time 05/11/2018 13.2  11.4 - 15.2 seconds Final  . INR 05/11/2018 1.01   Final  Performed at [facility], [address] [place], [place] [address]  . SURGICAL PATHOLOGY 05/11/2018    Final                   Value:Surgical Pathology CASE: [id number] PATIENT: Shirley Muscat Surgical Pathology Report     SPECIMEN SUBMITTED: A. Lung mass, right lower lobe; CT-guided biopsy  CLINICAL HISTORY: 4 cm RLL lung mass, other satellite lung nodules bilaterally, right paratracheal lymphadenopathy and T4 vertebral metastasis.  Smoker.  PRE-OPERATIVE DIAGNOSIS: Stage IV metastatic lung carcinoma  POST-OPERATIVE DIAGNOSIS: None provided.     DIAGNOSIS: A. LUNG, RIGHT LOWER LOBE; CT-GUIDED CORE BIOPSY: - ADENOCARCINOMA.  Comment: Invasive high-grade adenocarcinoma is present, with predominantly solid growth pattern. Immunohistochemistry was performed for confirmation. The neoplastic cells are positive for TTF1, with 90% of tumor cells showing strong nuclear staining. They are positive for Napsin A, with the majority of cells showing granular cytoplasmic staining. Scattered p40-positive cells are noted at the perimeter of the tumor cell nests, but the majority of tumor cells                          are negative. The morphology and IHC profile support the above diagnosis.  There is sufficient tissue for ancillary testing.  The diagnosis was communicated to Honor Loh, NP, via [place] on 05/13/2018.  IHC slides were prepared by [facility], [place], [place]. All controls stained  appropriately.  This test was developed and its performance characteristics determined by [facility]. It has not been cleared or approved by the [place] Food and Drug Administration. The FDA does not require this test to go through premarket FDA review. This test is used for clinical purposes. It should not be regarded as investigational or for research. This laboratory is certified under the Clinical Laboratory Improvement Amendments (CLIA) as qualified to perform high complexity clinical laboratory testing.   GROSS DESCRIPTION: A. Labeled: Right chest (verified with submitting department as right lower lobe lung mass biopsy identified by accompanying req                         uisition) Received: In formalin Tissue fragment(s): 2 Size: 1.5 to 1.7 cm in length and in diameter 0.1 cm Description: Pearline Cables to brown cores Entirely submitted in two cassettes.    Final Diagnosis performed by Bryan Lemma, MD.   Electronically signed 05/13/2018 4:21:40PM The electronic signature indicates that the named Attending Pathologist has evaluated the specimen  Technical component performed at [facility], [address], [place], [place] [address] Lab: [phone] Dir: Rush Farmer, MD, MMM  Professional component performed at [facility], [facility], [place], [place], [place] [address] Lab: [phone] Dir: Dellia Nims. Rubinas, MD     Assessment:  Carless Slatten is a 67 y.o. male with a high-grade adenocarcinoma of the right lung s/p CT-guided biopsy of a RLL lung mass on 05/11/2018.  Pathology revealed an invasive high-grade adenocarcinoma, with predominantly solid growth pattern.  The neoplastic cells were TTF-1 (+), Napsin A (+), and P40 (+).  He has a T4 vertebral metastasis.  Clinical stage is T4N1M1.  Chest CT angiogram on 04/27/2018 revealed no evidence of significant pulmonary embolus.  There was a 4.1 cm mass in the superior segment right lower lobe posteriorly causing  obliteration of the in coming bronchus and mild postobstructive change. There were multiple additional bilateral pulmonary nodules (<  1 cm - 1.4 cm).   There were enlarged right hilar lymph nodes.  There was metastasis to the T4 vertebra with mild associated compression.  There was emphysematous changes in the lungs.   PET scan on 04/30/2018 revealed a 3.4 cm hypermetabolic RLL pulmonary mass (SUV 13.2), 11 mm pulmonary nodule in the LEFT lung (SUV 4.5), RIGHT paratracheal lymph node (SUV 5.1), and hypermetabolic activity within the T4 vertebral body (SUV 10.2).   Thoracic spine MRI on 05/03/2018 revealed T4 metastasis with a 40% pathologic compression deformity and 5 mm of retropulsion of the vertebral body.  Retropulsion results in mild spinal canal stenosis and mild bilateral C4-5 foraminal stenosis.  There was paravertebral soft tissue thickening from mid T3 to mid T5, likely representing edema related to the pathologic compression deformity vs. possible extraosseous extension of the neoplasm.  There were no additional thoracic spinal metastases noted.   Head MRI on 05/03/2018 revealed no intracranial metastatic disease.  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  Incidental mention made of mild paranasal sinus disease.  Symptomatically, he is doing well. He denies any acute concerns. Pain is well managed on current regimen. No significant B symptoms or interval infections. Exam is grossly unchanged from previous.   Plan: 1. High-grade adenocarcinoma of the right lung:  Biopsy-proven invasive high-grade adenocarcinoma within the RIGHT lung.   TTF-1 (+), Napsin A (+), and P40 (+).   Review brain MRI. Imaging personally reviewed and felt to be c/w the dictated radiology report. Imaging reviewed with patient.   No evidence of  intracranial metastasis.  [employer] and PDL-1 testing today.  Discuss treatment with carboplatin, Alimta, and Keytruda based on initial phase II KEYNOTE-021 trial (123 patients) and phase III Keynote-189 trial (616 patients).  Trials looked at chemotherapy +/- Keytruda.  In the phase II trial, ORR was 55% versus 29% and PFS 13 months versus 6 months.  In the phase III trial, ORR was 48% versus 19% and PFS 8.8 months versus 4.9 months.  12 month OS was 69% versus 49%.  Increase in OS was seen in all patients regardless of PDL-1 expression, with the greatest responses in the highest expressors (< 1%: 62 vs 52%; 1-49%: 72 vs 51%; and > 50%: 73 vs 48%).  Patient provided with printed information today on his AVS for review at home.   Patient encouraged to make a list of questions and/or concerns for further discussion prior to beginning therapy using this medication.   Discuss vascular referral for port placement.   Schedule for chemotherapy education class.   Discuss need for S93 and folic acid supplementation.  B12 injection today, then every 6 weeks.   Send Rx for folic acid 1 mg daily.    Discuss Decadron pre and post Alimta.  Rx provided. 2. Osseous metastases  Review thoracic spine MRI. Imaging personally reviewed and felt to be consistent with the dictated radiology report. Imaging reviewed with patient.   T4 metastasis with a 40% pathologic compression deformity and 5 mm of retropulsion of the vertebral body.    Mild spinal canal stenosis and mild bilateral C4-5 foraminal stenosis.    Paravertebral soft tissue thickening from T3-T5, likely representing edema from pathologic compression deformity vs. possible extraosseous extension of the neoplasm. 3. Pain and symptom management  Followed by palliative medicine. [place], NP is adjusting pain  medications.   Pain has improved with Roxicodone 10-20 mg (7-8 pills/day) and scheduled APAP/IBU combo.  4. Influenza  prophylaxis  Patient requesting annual influenza vaccination. Discussed risks and benefits of vaccination. Blood counts reviewed and found to be adequate enough for patient to proceed with vaccination. Orders placed for Fluzone high dose vaccination to be administered today in clinic. 5. Rx provided for EMLA cream, ondansetron, and Decadron. 6. RTC in 1 week for MD assessment, labs (CBC with diff, CMP, Mg, TSH, free T4), and cycle #1 carboplatin + Alimta + Keytruda.    Honor Loh, NP  05/17/2018, 9:49 AM   I saw and evaluated the patient, participating in the key portions of the service and reviewing pertinent diagnostic studies and records.  I reviewed the nurse practitioner's note and agree with the findings and the plan.  The assessment and plan were discussed with the patient.  Multiple questions were asked by the patient and answered.   Nolon Stalls, MD 05/17/2018,9:49 AM

## 2018-05-17 ENCOUNTER — Inpatient Hospital Stay (HOSPITAL_BASED_OUTPATIENT_CLINIC_OR_DEPARTMENT_OTHER): Payer: Medicare Other | Admitting: Hematology and Oncology

## 2018-05-17 ENCOUNTER — Inpatient Hospital Stay: Payer: Medicare Other

## 2018-05-17 ENCOUNTER — Other Ambulatory Visit: Payer: Self-pay | Admitting: Hematology and Oncology

## 2018-05-17 ENCOUNTER — Encounter: Payer: Self-pay | Admitting: Hematology and Oncology

## 2018-05-17 ENCOUNTER — Encounter: Payer: Self-pay | Admitting: *Deleted

## 2018-05-17 VITALS — BP 158/78 | HR 78 | Temp 97.9°F | Resp 18 | Ht 67.0 in | Wt 167.0 lb

## 2018-05-17 DIAGNOSIS — Z515 Encounter for palliative care: Secondary | ICD-10-CM | POA: Diagnosis not present

## 2018-05-17 DIAGNOSIS — C3491 Malignant neoplasm of unspecified part of right bronchus or lung: Secondary | ICD-10-CM

## 2018-05-17 DIAGNOSIS — Z7189 Other specified counseling: Secondary | ICD-10-CM | POA: Insufficient documentation

## 2018-05-17 DIAGNOSIS — Z23 Encounter for immunization: Secondary | ICD-10-CM

## 2018-05-17 DIAGNOSIS — R918 Other nonspecific abnormal finding of lung field: Secondary | ICD-10-CM | POA: Diagnosis not present

## 2018-05-17 DIAGNOSIS — G893 Neoplasm related pain (acute) (chronic): Secondary | ICD-10-CM

## 2018-05-17 DIAGNOSIS — C3431 Malignant neoplasm of lower lobe, right bronchus or lung: Secondary | ICD-10-CM

## 2018-05-17 DIAGNOSIS — C7951 Secondary malignant neoplasm of bone: Secondary | ICD-10-CM

## 2018-05-17 DIAGNOSIS — Z87891 Personal history of nicotine dependence: Secondary | ICD-10-CM | POA: Diagnosis not present

## 2018-05-17 DIAGNOSIS — Z79899 Other long term (current) drug therapy: Secondary | ICD-10-CM | POA: Diagnosis not present

## 2018-05-17 DIAGNOSIS — Z7982 Long term (current) use of aspirin: Secondary | ICD-10-CM | POA: Diagnosis not present

## 2018-05-17 DIAGNOSIS — I252 Old myocardial infarction: Secondary | ICD-10-CM

## 2018-05-17 MED ORDER — CYANOCOBALAMIN 1000 MCG/ML IJ SOLN
1000.0000 ug | Freq: Once | INTRAMUSCULAR | Status: AC
  Administered 2018-05-17: 1000 ug via INTRAMUSCULAR

## 2018-05-17 MED ORDER — LIDOCAINE-PRILOCAINE 2.5-2.5 % EX CREA: TOPICAL_CREAM | CUTANEOUS | 3 refills | Status: DC

## 2018-05-17 MED ORDER — INFLUENZA VAC SPLIT HIGH-DOSE 0.5 ML IM SUSY
0.5000 mL | PREFILLED_SYRINGE | Freq: Once | INTRAMUSCULAR | Status: AC
  Administered 2018-05-17: 0.5 mL via INTRAMUSCULAR
  Filled 2018-05-17: qty 0.5

## 2018-05-17 MED ORDER — FOLIC ACID 1 MG PO TABS: 1.0000 mg | ORAL_TABLET | Freq: Every day | ORAL | 1 refills | Status: DC

## 2018-05-17 MED ORDER — ONDANSETRON HCL 8 MG PO TABS: 8.0000 mg | ORAL_TABLET | Freq: Two times a day (BID) | ORAL | 1 refills | Status: DC | PRN

## 2018-05-17 MED ORDER — DEXAMETHASONE 4 MG PO TABS: ORAL_TABLET | ORAL | 1 refills | Status: DC

## 2018-05-17 NOTE — Patient Instructions (Signed)
Pembrolizumab injection What is this medicine? PEMBROLIZUMAB (pem broe liz ue mab) is a monoclonal antibody. It is used to treat melanoma, head and neck cancer, Hodgkin lymphoma, non-small cell lung cancer, urothelial cancer, stomach cancer, and cancers that have a certain genetic condition. This medicine may be used for other purposes; ask your health care provider or pharmacist if you have questions. COMMON BRAND NAME(S): Keytruda What should I tell my health care provider before I take this medicine? They need to know if you have any of these conditions: -diabetes -immune system problems -inflammatory bowel disease -liver disease -lung or breathing disease -lupus -organ transplant -an unusual or allergic reaction to pembrolizumab, other medicines, foods, dyes, or preservatives -pregnant or trying to get pregnant -breast-feeding How should I use this medicine? This medicine is for infusion into a vein. It is given by a health care professional in a hospital or clinic setting. A special MedGuide will be given to you before each treatment. Be sure to read this information carefully each time. Talk to your pediatrician regarding the use of this medicine in children. While this drug may be prescribed for selected conditions, precautions do apply. Overdosage: If you think you have taken too much of this medicine contact a poison control center or emergency room at once. NOTE: This medicine is only for you. Do not share this medicine with others. What if I miss a dose? It is important not to miss your dose. Call your doctor or health care professional if you are unable to keep an appointment. What may interact with this medicine? Interactions have not been studied. Give your health care provider a list of all the medicines, herbs, non-prescription drugs, or dietary supplements you use. Also tell them if you smoke, drink alcohol, or use illegal drugs. Some items may interact with your  medicine. This list may not describe all possible interactions. Give your health care provider a list of all the medicines, herbs, non-prescription drugs, or dietary supplements you use. Also tell them if you smoke, drink alcohol, or use illegal drugs. Some items may interact with your medicine. What should I watch for while using this medicine? Your condition will be monitored carefully while you are receiving this medicine. You may need blood work done while you are taking this medicine. Do not become pregnant while taking this medicine or for 4 months after stopping it. Women should inform their doctor if they wish to become pregnant or think they might be pregnant. There is a potential for serious side effects to an unborn child. Talk to your health care professional or pharmacist for more information. Do not breast-feed an infant while taking this medicine or for 4 months after the last dose. What side effects may I notice from receiving this medicine? Side effects that you should report to your doctor or health care professional as soon as possible: -allergic reactions like skin rash, itching or hives, swelling of the face, lips, or tongue -bloody or black, tarry -breathing problems -changes in vision -chest pain -chills -constipation -cough -dizziness or feeling faint or lightheaded -fast or irregular heartbeat -fever -flushing -hair loss -low blood counts - this medicine may decrease the number of white blood cells, red blood cells and platelets. You may be at increased risk for infections and bleeding. -muscle pain -muscle weakness -persistent headache -signs and symptoms of high blood sugar such as dizziness; dry mouth; dry skin; fruity breath; nausea; stomach pain; increased hunger or thirst; increased urination -signs and symptoms of kidney  injury like trouble passing urine or change in the amount of urine -signs and symptoms of liver injury like dark urine, light-colored  stools, loss of appetite, nausea, right upper belly pain, yellowing of the eyes or skin -stomach pain -sweating -weight loss Side effects that usually do not require medical attention (report to your doctor or health care professional if they continue or are bothersome): -decreased appetite -diarrhea -tiredness This list may not describe all possible side effects. Call your doctor for medical advice about side effects. You may report side effects to FDA at 1-800-FDA-1088. Where should I keep my medicine? This drug is given in a hospital or clinic and will not be stored at home. NOTE: This sheet is a summary. It may not cover all possible information. If you have questions about this medicine, talk to your doctor, pharmacist, or health care provider.  2018 Elsevier/Gold Standard (2016-04-15 12:29:36) Pemetrexed injection What is this medicine? PEMETREXED (PEM e TREX ed) is a chemotherapy drug used to treat lung cancers like non-small cell lung cancer and mesothelioma. It may also be used to treat other cancers. This medicine may be used for other purposes; ask your health care provider or pharmacist if you have questions. COMMON BRAND NAME(S): Alimta What should I tell my health care provider before I take this medicine? They need to know if you have any of these conditions: -infection (especially a virus infection such as chickenpox, cold sores, or herpes) -kidney disease -low blood counts, like low white cell, platelet, or red cell counts -lung or breathing disease, like asthma -radiation therapy -an unusual or allergic reaction to pemetrexed, other medicines, foods, dyes, or preservative -pregnant or trying to get pregnant -breast-feeding How should I use this medicine? This drug is given as an infusion into a vein. It is administered in a hospital or clinic by a specially trained health care professional. Talk to your pediatrician regarding the use of this medicine in children.  Special care may be needed. Overdosage: If you think you have taken too much of this medicine contact a poison control center or emergency room at once. NOTE: This medicine is only for you. Do not share this medicine with others. What if I miss a dose? It is important not to miss your dose. Call your doctor or health care professional if you are unable to keep an appointment. What may interact with this medicine? This medicine may interact with the following medications: -Ibuprofen This list may not describe all possible interactions. Give your health care provider a list of all the medicines, herbs, non-prescription drugs, or dietary supplements you use. Also tell them if you smoke, drink alcohol, or use illegal drugs. Some items may interact with your medicine. What should I watch for while using this medicine? Visit your doctor for checks on your progress. This drug may make you feel generally unwell. This is not uncommon, as chemotherapy can affect healthy cells as well as cancer cells. Report any side effects. Continue your course of treatment even though you feel ill unless your doctor tells you to stop. In some cases, you may be given additional medicines to help with side effects. Follow all directions for their use. Call your doctor or health care professional for advice if you get a fever, chills or sore throat, or other symptoms of a cold or flu. Do not treat yourself. This drug decreases your body's ability to fight infections. Try to avoid being around people who are sick. This medicine may increase your  risk to bruise or bleed. Call your doctor or health care professional if you notice any unusual bleeding. Be careful brushing and flossing your teeth or using a toothpick because you may get an infection or bleed more easily. If you have any dental work done, tell your dentist you are receiving this medicine. Avoid taking products that contain aspirin, acetaminophen, ibuprofen, naproxen,  or ketoprofen unless instructed by your doctor. These medicines may hide a fever. Call your doctor or health care professional if you get diarrhea or mouth sores. Do not treat yourself. To protect your kidneys, drink water or other fluids as directed while you are taking this medicine. Do not become pregnant while taking this medicine or for 6 months after stopping it. Women should inform their doctor if they wish to become pregnant or think they might be pregnant. Men should not father a child while taking this medicine and for 3 months after stopping it. This may interfere with the ability to father a child. You should talk to your doctor or health care professional if you are concerned about your fertility. There is a potential for serious side effects to an unborn child. Talk to your health care professional or pharmacist for more information. Do not breast-feed an infant while taking this medicine or for 1 week after stopping it. What side effects may I notice from receiving this medicine? Side effects that you should report to your doctor or health care professional as soon as possible: -allergic reactions like skin rash, itching or hives, swelling of the face, lips, or tongue -breathing problems -redness, blistering, peeling or loosening of the skin, including inside the mouth -signs and symptoms of bleeding such as bloody or black, tarry stools; red or dark-brown urine; spitting up blood or brown material that looks like coffee grounds; red spots on the skin; unusual bruising or bleeding from the eye, gums, or nose -signs and symptoms of infection like fever or chills; cough; sore throat; pain or trouble passing urine -signs and symptoms of kidney injury like trouble passing urine or change in the amount of urine -signs and symptoms of liver injury like dark yellow or brown urine; general ill feeling or flu-like symptoms; light-colored stools; loss of appetite; nausea; right upper belly pain;  unusually weak or tired; yellowing of the eyes or skin Side effects that usually do not require medical attention (report to your doctor or health care professional if they continue or are bothersome): -constipation -dizziness -mouth sores -nausea, vomiting -pain, tingling, numbness in the hands or feet -unusually weak or tired This list may not describe all possible side effects. Call your doctor for medical advice about side effects. You may report side effects to FDA at 1-800-FDA-1088. Where should I keep my medicine? This drug is given in a hospital or clinic and will not be stored at home. NOTE: This sheet is a summary. It may not cover all possible information. If you have questions about this medicine, talk to your doctor, pharmacist, or health care provider.  2018 Elsevier/Gold Standard (2016-05-06 18:51:46) Carboplatin injection What is this medicine? CARBOPLATIN (KAR boe pla tin) is a chemotherapy drug. It targets fast dividing cells, like cancer cells, and causes these cells to die. This medicine is used to treat ovarian cancer and many other cancers. This medicine may be used for other purposes; ask your health care provider or pharmacist if you have questions. COMMON BRAND NAME(S): Paraplatin What should I tell my health care provider before I take this medicine?  They need to know if you have any of these conditions: -blood disorders -hearing problems -kidney disease -recent or ongoing radiation therapy -an unusual or allergic reaction to carboplatin, cisplatin, other chemotherapy, other medicines, foods, dyes, or preservatives -pregnant or trying to get pregnant -breast-feeding How should I use this medicine? This drug is usually given as an infusion into a vein. It is administered in a hospital or clinic by a specially trained health care professional. Talk to your pediatrician regarding the use of this medicine in children. Special care may be needed. Overdosage: If you  think you have taken too much of this medicine contact a poison control center or emergency room at once. NOTE: This medicine is only for you. Do not share this medicine with others. What if I miss a dose? It is important not to miss a dose. Call your doctor or health care professional if you are unable to keep an appointment. What may interact with this medicine? -medicines for seizures -medicines to increase blood counts like filgrastim, pegfilgrastim, sargramostim -some antibiotics like amikacin, gentamicin, neomycin, streptomycin, tobramycin -vaccines Talk to your doctor or health care professional before taking any of these medicines: -acetaminophen -aspirin -ibuprofen -ketoprofen -naproxen This list may not describe all possible interactions. Give your health care provider a list of all the medicines, herbs, non-prescription drugs, or dietary supplements you use. Also tell them if you smoke, drink alcohol, or use illegal drugs. Some items may interact with your medicine. What should I watch for while using this medicine? Your condition will be monitored carefully while you are receiving this medicine. You will need important blood work done while you are taking this medicine. This drug may make you feel generally unwell. This is not uncommon, as chemotherapy can affect healthy cells as well as cancer cells. Report any side effects. Continue your course of treatment even though you feel ill unless your doctor tells you to stop. In some cases, you may be given additional medicines to help with side effects. Follow all directions for their use. Call your doctor or health care professional for advice if you get a fever, chills or sore throat, or other symptoms of a cold or flu. Do not treat yourself. This drug decreases your body's ability to fight infections. Try to avoid being around people who are sick. This medicine may increase your risk to bruise or bleed. Call your doctor or health care  professional if you notice any unusual bleeding. Be careful brushing and flossing your teeth or using a toothpick because you may get an infection or bleed more easily. If you have any dental work done, tell your dentist you are receiving this medicine. Avoid taking products that contain aspirin, acetaminophen, ibuprofen, naproxen, or ketoprofen unless instructed by your doctor. These medicines may hide a fever. Do not become pregnant while taking this medicine. Women should inform their doctor if they wish to become pregnant or think they might be pregnant. There is a potential for serious side effects to an unborn child. Talk to your health care professional or pharmacist for more information. Do not breast-feed an infant while taking this medicine. What side effects may I notice from receiving this medicine? Side effects that you should report to your doctor or health care professional as soon as possible: -allergic reactions like skin rash, itching or hives, swelling of the face, lips, or tongue -signs of infection - fever or chills, cough, sore throat, pain or difficulty passing urine -signs of decreased platelets or bleeding -  bruising, pinpoint red spots on the skin, black, tarry stools, nosebleeds -signs of decreased red blood cells - unusually weak or tired, fainting spells, lightheadedness -breathing problems -changes in hearing -changes in vision -chest pain -high blood pressure -low blood counts - This drug may decrease the number of white blood cells, red blood cells and platelets. You may be at increased risk for infections and bleeding. -nausea and vomiting -pain, swelling, redness or irritation at the injection site -pain, tingling, numbness in the hands or feet -problems with balance, talking, walking -trouble passing urine or change in the amount of urine Side effects that usually do not require medical attention (report to your doctor or health care professional if they  continue or are bothersome): -hair loss -loss of appetite -metallic taste in the mouth or changes in taste This list may not describe all possible side effects. Call your doctor for medical advice about side effects. You may report side effects to FDA at 1-800-FDA-1088. Where should I keep my medicine? This drug is given in a hospital or clinic and will not be stored at home. NOTE: This sheet is a summary. It may not cover all possible information. If you have questions about this medicine, talk to your doctor, pharmacist, or health care provider.  2018 Elsevier/Gold Standard (2007-10-12 14:38:05)

## 2018-05-17 NOTE — Progress Notes (Signed)
START ON PATHWAY REGIMEN - Non-Small Cell Lung     A cycle is every 21 days:     Pembrolizumab      Pemetrexed      Carboplatin   **Always confirm dose/schedule in your pharmacy ordering system**  Patient Characteristics: Stage IV Metastatic, Nonsquamous, Initial Chemotherapy/Immunotherapy, PS = 0, 1, ALK Translocation Negative/Unknown and EGFR Mutation Negative/Non-Sensitizing/Unknown, PD-L1 Expression Positive 1-49% (TPS) / Negative / Not Tested / Awaiting Test Results  and Immunotherapy Candidate AJCC T Category: T2a Current Disease Status: Distant Metastases AJCC N Category: N2 AJCC M Category: M1b AJCC 8 Stage Grouping: IVA Histology: Nonsquamous Cell ROS1 Rearrangement Status: Awaiting Test Results T790M Mutation Status: Not Applicable - EGFR Mutation Negative/Unknown Other Mutations/Biomarkers: No Other Actionable Mutations NTRK Gene Fusion Status: Awaiting Test Results PD-L1 Expression Status: Awaiting Test Results Chemotherapy/Immunotherapy LOT: Initial Chemotherapy/Immunotherapy Molecular Targeted Therapy: Not Appropriate ALK Translocation Status: Awaiting Test Results EGFR Mutation Status: Awaiting Test Results BRAF V600E Mutation Status: Awaiting Test Results Performance Status: PS = 0, 1 Immunotherapy Candidate Status: Candidate for Immunotherapy Intent of Therapy: Non-Curative / Palliative Intent, Discussed with Patient

## 2018-05-17 NOTE — Progress Notes (Signed)
  Oncology Nurse Navigator Documentation  Navigator Location: [facility] (05/17/18 1200)   )Navigator Encounter Type: Diagnostic Results;Follow-up Appt (05/17/18 1200)     Confirmed Diagnosis Date: 05/14/18 (05/17/18 1200)               Patient Visit Type: MedOnc (05/17/18 1200) Treatment Phase: Pre-Tx/Tx Discussion (05/17/18 1200) Barriers/Navigation Needs: Coordination of Care;Education (05/17/18 1200) Education: Understanding Cancer/ Treatment Options;Newly Diagnosed Cancer Education (05/17/18 1200) Interventions: Coordination of Care;Education (05/17/18 1200)   Coordination of Care: Appts;Chemo (05/17/18 1200) Education Method: Verbal;Written (05/17/18 1200)         met with patient and his friends during follow up visit with Dr. Mike Gip to discuss pathology results and treatment options. All questions answered at the time of visit. Pt given resources regarding diagnosis and supportive services available. Reviewed new prescriptions sent in to pharmacy for chemo. Reviewed upcoming appts. Informed pt to call if has any further questions or needs. Pt verbalized understanding.   [facility] + PDL1 testing has been submitted to [facility].        Time Spent with Patient: 60 (05/17/18 1200)

## 2018-05-18 ENCOUNTER — Inpatient Hospital Stay: Payer: Medicare Other

## 2018-05-18 ENCOUNTER — Inpatient Hospital Stay: Payer: Medicare Other | Admitting: Nurse Practitioner

## 2018-05-20 ENCOUNTER — Other Ambulatory Visit (INDEPENDENT_AMBULATORY_CARE_PROVIDER_SITE_OTHER): Payer: Self-pay | Admitting: Nurse Practitioner

## 2018-05-21 ENCOUNTER — Other Ambulatory Visit: Payer: Self-pay | Admitting: *Deleted

## 2018-05-21 ENCOUNTER — Ambulatory Visit
Admission: RE | Admit: 2018-05-21 | Discharge: 2018-05-21 | Disposition: A | Discharge status: Home / Self Care | Payer: Medicare Other | Source: Ambulatory Visit | Attending: Vascular Surgery | Admitting: Vascular Surgery

## 2018-05-21 ENCOUNTER — Encounter
Admission: RE | Disposition: A | Discharge status: Home / Self Care | Payer: Self-pay | Source: Ambulatory Visit | Attending: Vascular Surgery

## 2018-05-21 DIAGNOSIS — Z8249 Family history of ischemic heart disease and other diseases of the circulatory system: Secondary | ICD-10-CM | POA: Diagnosis not present

## 2018-05-21 DIAGNOSIS — M546 Pain in thoracic spine: Secondary | ICD-10-CM | POA: Diagnosis not present

## 2018-05-21 DIAGNOSIS — Z87891 Personal history of nicotine dependence: Secondary | ICD-10-CM | POA: Diagnosis not present

## 2018-05-21 DIAGNOSIS — C7951 Secondary malignant neoplasm of bone: Secondary | ICD-10-CM | POA: Diagnosis not present

## 2018-05-21 DIAGNOSIS — I252 Old myocardial infarction: Secondary | ICD-10-CM | POA: Diagnosis not present

## 2018-05-21 DIAGNOSIS — R5383 Other fatigue: Secondary | ICD-10-CM | POA: Insufficient documentation

## 2018-05-21 DIAGNOSIS — Z955 Presence of coronary angioplasty implant and graft: Secondary | ICD-10-CM | POA: Insufficient documentation

## 2018-05-21 DIAGNOSIS — Z809 Family history of malignant neoplasm, unspecified: Secondary | ICD-10-CM | POA: Insufficient documentation

## 2018-05-21 DIAGNOSIS — R634 Abnormal weight loss: Secondary | ICD-10-CM | POA: Insufficient documentation

## 2018-05-21 DIAGNOSIS — C3490 Malignant neoplasm of unspecified part of unspecified bronchus or lung: Secondary | ICD-10-CM | POA: Diagnosis not present

## 2018-05-21 DIAGNOSIS — Z7982 Long term (current) use of aspirin: Secondary | ICD-10-CM | POA: Insufficient documentation

## 2018-05-21 DIAGNOSIS — Z9889 Other specified postprocedural states: Secondary | ICD-10-CM | POA: Insufficient documentation

## 2018-05-21 DIAGNOSIS — Z79899 Other long term (current) drug therapy: Secondary | ICD-10-CM | POA: Diagnosis not present

## 2018-05-21 DIAGNOSIS — G893 Neoplasm related pain (acute) (chronic): Secondary | ICD-10-CM | POA: Insufficient documentation

## 2018-05-21 DIAGNOSIS — C3431 Malignant neoplasm of lower lobe, right bronchus or lung: Secondary | ICD-10-CM | POA: Insufficient documentation

## 2018-05-21 HISTORY — PX: PORTA CATH INSERTION: CATH118285

## 2018-05-21 SURGERY — PORTA CATH INSERTION
Anesthesia: Moderate Sedation

## 2018-05-21 MED ORDER — SODIUM CHLORIDE 0.9 % IV SOLN
INTRAVENOUS | Status: DC
  Administered 2018-05-21: 14:00:00 via INTRAVENOUS

## 2018-05-21 MED ORDER — HEPARIN (PORCINE) IN NACL 1000-0.9 UT/500ML-% IV SOLN
INTRAVENOUS | Status: AC
  Filled 2018-05-21: qty 500

## 2018-05-21 MED ORDER — ONDANSETRON HCL 4 MG/2ML IJ SOLN: 4.0000 mg | Freq: Four times a day (QID) | INTRAMUSCULAR | Status: DC | PRN

## 2018-05-21 MED ORDER — CEFAZOLIN SODIUM-DEXTROSE 2-4 GM/100ML-% IV SOLN: 2.0000 g | Freq: Once | INTRAVENOUS | Status: DC

## 2018-05-21 MED ORDER — FENTANYL CITRATE (PF) 100 MCG/2ML IJ SOLN
INTRAMUSCULAR | Status: DC | PRN
  Administered 2018-05-21: 25 ug via INTRAVENOUS
  Administered 2018-05-21: 50 ug via INTRAVENOUS

## 2018-05-21 MED ORDER — SODIUM CHLORIDE 0.9 % IV SOLN
Freq: Once | INTRAVENOUS | Status: DC
  Filled 2018-05-21: qty 2

## 2018-05-21 MED ORDER — DEXTROSE 5 % IV SOLN
2.0000 g | Freq: Once | INTRAVENOUS | Status: DC
  Administered 2018-05-21: 2 g via INTRAVENOUS

## 2018-05-21 MED ORDER — HYDROMORPHONE HCL 1 MG/ML IJ SOLN: 1.0000 mg | Freq: Once | INTRAMUSCULAR | Status: DC | PRN

## 2018-05-21 MED ORDER — LIDOCAINE-EPINEPHRINE (PF) 1 %-1:200000 IJ SOLN
INTRAMUSCULAR | Status: AC
  Filled 2018-05-21: qty 90

## 2018-05-21 MED ORDER — MIDAZOLAM HCL 5 MG/5ML IJ SOLN
INTRAMUSCULAR | Status: AC
  Filled 2018-05-21: qty 5

## 2018-05-21 MED ORDER — CEFAZOLIN SODIUM-DEXTROSE 2-4 GM/100ML-% IV SOLN
INTRAVENOUS | Status: AC
  Filled 2018-05-21: qty 100

## 2018-05-21 MED ORDER — MIDAZOLAM HCL 2 MG/2ML IJ SOLN
INTRAMUSCULAR | Status: DC | PRN
  Administered 2018-05-21: 2 mg via INTRAVENOUS
  Administered 2018-05-21: 1 mg via INTRAVENOUS

## 2018-05-21 MED ORDER — FENTANYL CITRATE (PF) 100 MCG/2ML IJ SOLN
INTRAMUSCULAR | Status: AC
  Filled 2018-05-21: qty 2

## 2018-05-21 MED ORDER — OXYCODONE HCL 10 MG PO TABS: 10.0000 mg | ORAL_TABLET | Freq: Four times a day (QID) | ORAL | 0 refills | Status: DC | PRN

## 2018-05-21 SURGICAL SUPPLY — 10 items
DERMABOND ADVANCED (GAUZE/BANDAGES/DRESSINGS) ×1
DERMABOND ADVANCED .7 DNX12 (GAUZE/BANDAGES/DRESSINGS) ×1 IMPLANT
KIT PORT POWER 8FR ISP CVUE (Port) ×2 IMPLANT
PACK ANGIOGRAPHY (CUSTOM PROCEDURE TRAY) ×2 IMPLANT
PAD GROUND ADULT SPLIT (MISCELLANEOUS) ×2 IMPLANT
PENCIL ELECTRO HAND CTR (MISCELLANEOUS) ×2 IMPLANT
SUT MNCRL AB 4-0 PS2 18 (SUTURE) ×2 IMPLANT
SUT PROLENE 0 CT 1 30 (SUTURE) ×2 IMPLANT
SUT VIC AB 3-0 SH 27 (SUTURE) ×1
SUT VIC AB 3-0 SH 27X BRD (SUTURE) ×1 IMPLANT

## 2018-05-21 NOTE — Discharge Instructions (Signed)
Moderate Conscious Sedation, Adult, Care After °These instructions provide you with information about caring for yourself after your procedure. Your health care provider may also give you more specific instructions. Your treatment has been planned according to current medical practices, but problems sometimes occur. Call your health care provider if you have any problems or questions after your procedure. °What can I expect after the procedure? °After your procedure, it is common: °· To feel sleepy for several hours. °· To feel clumsy and have poor balance for several hours. °· To have poor judgment for several hours. °· To vomit if you eat too soon. ° °Follow these instructions at home: °For at least 24 hours after the procedure: ° °· Do not: °? Participate in activities where you could fall or become injured. °? Drive. °? Use heavy machinery. °? Drink alcohol. °? Take sleeping pills or medicines that cause drowsiness. °? Make important decisions or sign legal documents. °? Take care of children on your own. °· Rest. °Eating and drinking °· Follow the diet recommended by your health care provider. °· If you vomit: °? Drink water, juice, or soup when you can drink without vomiting. °? Make sure you have little or no nausea before eating solid foods. °General instructions °· Have a responsible adult stay with you until you are awake and alert. °· Take over-the-counter and prescription medicines only as told by your health care provider. °· If you smoke, do not smoke without supervision. °· Keep all follow-up visits as told by your health care provider. This is important. °Contact a health care provider if: °· You keep feeling nauseous or you keep vomiting. °· You feel light-headed. °· You develop a rash. °· You have a fever. °Get help right away if: °· You have trouble breathing. °This information is not intended to replace advice given to you by your health care provider. Make sure you discuss any questions you have  with your health care provider. °Document Released: 04/27/2013 Document Revised: 12/10/2015 Document Reviewed: 10/27/2015 °Elsevier Interactive Patient Education © 2018 [employer] °Implanted Port Home Guide °An implanted port is a type of central line that is placed under the skin. Central lines are used to provide IV access when treatment or nutrition needs to be given through a person’s veins. Implanted ports are used for long-term IV access. An implanted port may be placed because: °· You need IV medicine that would be irritating to the small veins in your hands or arms. °· You need long-term IV medicines, such as antibiotics. °· You need IV nutrition for a long period. °· You need frequent blood draws for lab tests. °· You need dialysis. ° °Implanted ports are usually placed in the chest area, but they can also be placed in the upper arm, the abdomen, or the leg. An implanted port has two main parts: °· Reservoir. The reservoir is round and will appear as a small, raised area under your skin. The reservoir is the part where a needle is inserted to give medicines or draw blood. °· Catheter. The catheter is a thin, flexible tube that extends from the reservoir. The catheter is placed into a large vein. Medicine that is inserted into the reservoir goes into the catheter and then into the vein. ° °How will I care for my incision site? °Do not get the incision site wet. Bathe or shower as directed by your health care provider. °How is my port accessed? °Special steps must be taken to access the port: °·   Before the port is accessed, a numbing cream can be placed on the skin. This helps numb the skin over the port site. °· Your health care provider uses a sterile technique to access the port. °? Your health care provider must put on a mask and sterile gloves. °? The skin over your port is cleaned carefully with an antiseptic and allowed to dry. °? The port is gently pinched between sterile gloves, and a needle is  inserted into the port. °· Only "non-coring" port needles should be used to access the port. Once the port is accessed, a blood return should be checked. This helps ensure that the port is in the vein and is not clogged. °· If your port needs to remain accessed for a constant infusion, a clear (transparent) bandage will be placed over the needle site. The bandage and needle will need to be changed every week, or as directed by your health care provider. °· Keep the bandage covering the needle clean and dry. Do not get it wet. Follow your health care provider’s instructions on how to take a shower or bath while the port is accessed. °· If your port does not need to stay accessed, no bandage is needed over the port. ° °What is flushing? °Flushing helps keep the port from getting clogged. Follow your health care provider’s instructions on how and when to flush the port. Ports are usually flushed with saline solution or a medicine called heparin. The need for flushing will depend on how the port is used. °· If the port is used for intermittent medicines or blood draws, the port will need to be flushed: °? After medicines have been given. °? After blood has been drawn. °? As part of routine maintenance. °· If a constant infusion is running, the port may not need to be flushed. ° °How long will my port stay implanted? °The port can stay in for as long as your health care provider thinks it is needed. When it is time for the port to come out, surgery will be done to remove it. The procedure is similar to the one performed when the port was put in. °When should I seek immediate medical care? °When you have an implanted port, you should seek immediate medical care if: °· You notice a bad smell coming from the incision site. °· You have swelling, redness, or drainage at the incision site. °· You have more swelling or pain at the port site or the surrounding area. °· You have a fever that is not controlled with  medicine. ° °This information is not intended to replace advice given to you by your health care provider. Make sure you discuss any questions you have with your health care provider. °Document Released: 07/07/2005 Document Revised: 12/13/2015 Document Reviewed: 03/14/2013 °Elsevier Interactive Patient Education © 2017 [employer] ° °

## 2018-05-21 NOTE — Op Note (Signed)
      [facility]       Operative Note  Date: 05/21/2018  Preoperative diagnosis:  1. Lung cancer  Postoperative diagnosis:  Same as above  Procedures: #1. Ultrasound guidance for vascular access to the right internal jugular vein. #2. Fluoroscopic guidance for placement of catheter. #3. Placement of CT compatible Port-A-Cath, right internal jugular vein.  Surgeon: Leotis Pain, MD.   Anesthesia: Local with moderate conscious sedation for approximately 20  minutes using 2 mg of Versed and 50 mcg of Fentanyl  Fluoroscopy time: less than 1 minute  Contrast used: 0  Estimated blood loss: 15 cc  Indication for the procedure:  The patient is a 67 y.o.male with lung cancer.  The patient needs a Port-A-Cath for durable venous access, chemotherapy, lab draws, and CT scans. We are asked to place this. Risks and benefits were discussed and informed consent was obtained.  Description of procedure: The patient was brought to the vascular and interventional radiology suite.  Moderate conscious sedation was administered throughout the procedure during a face to face encounter with the patient with my supervision of the RN administering medicines and monitoring the patient's vital signs, pulse oximetry, telemetry and mental status throughout from the start of the procedure until the patient was taken to the recovery room. The right neck chest and shoulder were sterilely prepped and draped, and a sterile surgical field was created. Ultrasound was used to help visualize a patent right internal jugular vein. This was then accessed under direct ultrasound guidance without difficulty with the Seldinger needle and a permanent image was recorded. A J-wire was placed. After skin nick and dilatation, the peel-away sheath was then placed over the wire. I then anesthetized an area under the clavicle approximately 1-2 fingerbreadths. A transverse incision was created and an inferior pocket was  created with electrocautery and blunt dissection. The port was then brought onto the field, placed into the pocket and secured to the chest wall with 2 Prolene sutures. The catheter was connected to the port and tunneled from the subclavicular incision to the access site. Fluoroscopic guidance was then used to cut the catheter to an appropriate length. The catheter was then placed through the peel-away sheath and the peel-away sheath was removed. The catheter tip was parked in excellent location under fluorocoscopic guidance in the cavoatrial junction. The pocket was then irrigated with antibiotic impregnated saline and the wound was closed with a running 3-0 Vicryl and a 4-0 Monocryl. The access incision was closed with a single 4-0 Monocryl. The Huber needle was used to withdraw blood and flush the port with heparinized saline. Dermabond was then placed as a dressing. The patient tolerated the procedure well and was taken to the recovery room in stable condition.   Leotis Pain 05/21/2018 2:50 PM   This note was created with Dragon Medical transcription system. Any errors in dictation are purely unintentional.

## 2018-05-21 NOTE — H&P (Signed)
[facility] History & Physical Update  The patient was interviewed and re-examined.  The patient's previous History and Physical has been reviewed and is unchanged.  There is no change in the plan of care. We plan to proceed with the scheduled procedure.  Leotis Pain, MD  05/21/2018, 2:23 PM

## 2018-05-24 ENCOUNTER — Inpatient Hospital Stay: Payer: Medicare Other

## 2018-05-24 ENCOUNTER — Encounter: Payer: Self-pay | Admitting: *Deleted

## 2018-05-24 ENCOUNTER — Other Ambulatory Visit: Payer: Self-pay | Admitting: Hematology and Oncology

## 2018-05-24 ENCOUNTER — Inpatient Hospital Stay (HOSPITAL_BASED_OUTPATIENT_CLINIC_OR_DEPARTMENT_OTHER): Payer: Medicare Other | Admitting: Hospice and Palliative Medicine

## 2018-05-24 ENCOUNTER — Inpatient Hospital Stay: Payer: Medicare Other | Attending: Hematology and Oncology

## 2018-05-24 ENCOUNTER — Encounter: Payer: Self-pay | Admitting: Vascular Surgery

## 2018-05-24 ENCOUNTER — Inpatient Hospital Stay (HOSPITAL_BASED_OUTPATIENT_CLINIC_OR_DEPARTMENT_OTHER): Payer: Medicare Other | Admitting: Hematology and Oncology

## 2018-05-24 VITALS — BP 128/83 | HR 76 | Temp 97.9°F | Resp 18 | Ht 67.0 in | Wt 165.9 lb

## 2018-05-24 DIAGNOSIS — C7801 Secondary malignant neoplasm of right lung: Secondary | ICD-10-CM

## 2018-05-24 DIAGNOSIS — C3431 Malignant neoplasm of lower lobe, right bronchus or lung: Secondary | ICD-10-CM

## 2018-05-24 DIAGNOSIS — Z87891 Personal history of nicotine dependence: Secondary | ICD-10-CM | POA: Insufficient documentation

## 2018-05-24 DIAGNOSIS — C7951 Secondary malignant neoplasm of bone: Secondary | ICD-10-CM

## 2018-05-24 DIAGNOSIS — M4802 Spinal stenosis, cervical region: Secondary | ICD-10-CM | POA: Insufficient documentation

## 2018-05-24 DIAGNOSIS — C7802 Secondary malignant neoplasm of left lung: Secondary | ICD-10-CM | POA: Insufficient documentation

## 2018-05-24 DIAGNOSIS — Z79899 Other long term (current) drug therapy: Secondary | ICD-10-CM | POA: Insufficient documentation

## 2018-05-24 DIAGNOSIS — K5900 Constipation, unspecified: Secondary | ICD-10-CM | POA: Insufficient documentation

## 2018-05-24 DIAGNOSIS — Z791 Long term (current) use of non-steroidal anti-inflammatories (NSAID): Secondary | ICD-10-CM | POA: Insufficient documentation

## 2018-05-24 DIAGNOSIS — Z7189 Other specified counseling: Secondary | ICD-10-CM

## 2018-05-24 DIAGNOSIS — G893 Neoplasm related pain (acute) (chronic): Secondary | ICD-10-CM

## 2018-05-24 DIAGNOSIS — Z5111 Encounter for antineoplastic chemotherapy: Secondary | ICD-10-CM | POA: Diagnosis not present

## 2018-05-24 DIAGNOSIS — I252 Old myocardial infarction: Secondary | ICD-10-CM | POA: Diagnosis not present

## 2018-05-24 DIAGNOSIS — Z5112 Encounter for antineoplastic immunotherapy: Secondary | ICD-10-CM | POA: Insufficient documentation

## 2018-05-24 LAB — COMPREHENSIVE METABOLIC PANEL
ALT: 27 U/L (ref 0–44)
AST: 29 U/L (ref 15–41)
Albumin: 3.5 g/dL (ref 3.5–5.0)
Alkaline Phosphatase: 72 U/L (ref 38–126)
Anion gap: 10 (ref 5–15)
BUN: 19 mg/dL (ref 8–23)
CO2: 24 mmol/L (ref 22–32)
Calcium: 9.4 mg/dL (ref 8.9–10.3)
Chloride: 104 mmol/L (ref 98–111)
Creatinine, Ser: 0.73 mg/dL (ref 0.61–1.24)
GFR calc Af Amer: >60 mL/min (ref >60)
GFR calc non Af Amer: >60 mL/min (ref >60)
Glucose, Bld: 126 mg/dL — ABNORMAL HIGH (ref 70–99)
Potassium: 4.5 mmol/L (ref 3.5–5.1)
Sodium: 138 mmol/L (ref 135–145)
Total Bilirubin: 0.3 mg/dL (ref 0.3–1.2)
Total Protein: 7.4 g/dL (ref 6.5–8.1)

## 2018-05-24 LAB — CBC WITH DIFFERENTIAL/PLATELET
Abs Immature Granulocytes: 0.06 10*3/uL (ref 0.00–0.07)
Basophils Absolute: 0.1 10*3/uL (ref 0.0–0.1)
Basophils Relative: 0 %
Eosinophils Absolute: 0 10*3/uL (ref 0.0–0.5)
Eosinophils Relative: 0 %
HCT: 40.2 % (ref 39.0–52.0)
Hemoglobin: 13.3 g/dL (ref 13.0–17.0)
Immature Granulocytes: 0 %
Lymphocytes Relative: 9 %
Lymphs Abs: 1.3 10*3/uL (ref 0.7–4.0)
MCH: 28.9 pg (ref 26.0–34.0)
MCHC: 33.1 g/dL (ref 30.0–36.0)
MCV: 87.4 fL (ref 80.0–100.0)
Monocytes Absolute: 1.2 10*3/uL — ABNORMAL HIGH (ref 0.1–1.0)
Monocytes Relative: 8 %
Neutro Abs: 12.2 10*3/uL — ABNORMAL HIGH (ref 1.7–7.7)
Neutrophils Relative %: 83 %
Platelets: 445 10*3/uL — ABNORMAL HIGH (ref 150–400)
RBC: 4.6 MIL/uL (ref 4.22–5.81)
RDW: 12 % (ref 11.5–15.5)
WBC: 14.8 10*3/uL — ABNORMAL HIGH (ref 4.0–10.5)
nRBC: 0 % (ref 0.0–0.2)

## 2018-05-24 LAB — MAGNESIUM: Magnesium: 2.4 mg/dL (ref 1.7–2.4)

## 2018-05-24 LAB — TSH: TSH: 0.385 u[IU]/mL (ref 0.350–4.500)

## 2018-05-24 LAB — T4, FREE: Free T4: 1.04 ng/dL (ref 0.82–1.77)

## 2018-05-24 MED ORDER — SODIUM CHLORIDE 0.9 % IV SOLN
200.0000 mg | Freq: Once | INTRAVENOUS | Status: AC
  Administered 2018-05-24: 200 mg via INTRAVENOUS
  Filled 2018-05-24: qty 8

## 2018-05-24 MED ORDER — HEPARIN SOD (PORK) LOCK FLUSH 100 UNIT/ML IV SOLN
500.0000 [IU] | Freq: Once | INTRAVENOUS | Status: AC | PRN
  Administered 2018-05-24: 500 [IU]

## 2018-05-24 MED ORDER — SODIUM CHLORIDE 0.9 % IV SOLN
Freq: Once | INTRAVENOUS | Status: AC
  Administered 2018-05-24: 11:00:00 via INTRAVENOUS
  Filled 2018-05-24: qty 250

## 2018-05-24 NOTE — Progress Notes (Signed)
[facility] day:  05/24/2018  Chief Complaint: Jimmy West is a 67 y.o. male with metastatic adenocarcinoma of the lung who is seen for assessment prior to cycle #1 carboplatin, Alimta, and pembrolizumab.  HPI:  The patient was last seen in the medical oncology clinic on 05/17/2018.  At that time, he was doing well. He denied any acute concerns. Pain was well managed on current regimen. He denied any significant B symptoms or interval infections. Exam was grossly unchanged from previous.   He received B12 and began oral folate in anticipation of chemotherapy.  Patient attended chemotherapy education class with Magdalene Patricia, RN on 05/18/2018. He was provided with written information regarding his proposed course of chemotherapy treatments. Patient was given the opportunity to have his preliminary clinical questions fielded by cancer center nurse navigator.   He underwent port-a-cath placement on 05/21/2018.  Foundation One testing was insufficient for analysis. PDL-1 testing revealed TPS 90%.  During the interim, patient is doing well. Pain is well controlled with currently prescribed pain medications. Patient developed some significant issues with constipation over the weekend. He took the recommended Senokot, which helped with his symptom. Patient remains fatigued. He denies any other acute symptoms. Patient denies that he has experienced any B symptoms. Hedenies any interval infections.   Patient advises that he maintains an adequate appetite. He is eating well. Weight today is 165 lb 14.4 oz (75.3 kg), which compared to his last visit to the clinic, represents a 2 pound decrease.    Patient denies pain in the clinic today.   Past Medical History:  Diagnosis Date  . Bulging lumbar disc   . Heart attack [facility])     Past Surgical History:  Procedure Laterality Date  . CARDIAC CATHETERIZATION     two stents  . KNEE SURGERY Left   .  PORTA CATH INSERTION N/A 05/21/2018   Procedure: PORTA CATH INSERTION;  Surgeon: Algernon Huxley, MD;  Location: [facility];  Service: Cardiovascular;  Laterality: N/A;    Family History  Problem Relation Age of Onset  . Heart failure Father   . Cancer Maternal Aunt   . Heart failure Maternal Uncle   . Dementia Paternal Grandmother   . Cancer Maternal Aunt     Social History:  reports that he quit smoking about 14 years ago. His smoking use included cigarettes. He has a 22.50 pack-year smoking history. He has never used smokeless tobacco. He reports that he drinks about 15.0 standard drinks of alcohol per week. He reports that he does not use drugs. He started smoking at age 67.   He is smoking 1 1/2 packs/day.  Patient is employed as as full time Probation officer. Patient denies known exposures to radiation on toxins. The patient is accompanied by 2 friends ([name] and [address]) today, and [place] (Engineer, site) today.  Allergies: No Known Allergies  Current Medications: Current Outpatient Medications  Medication Sig Dispense Refill  . aspirin EC 81 MG tablet Take 81 mg by mouth daily.    . folic acid (FOLVITE) 1 MG tablet Take 1 tablet (1 mg total) by mouth daily. 90 tablet 1  . Oxycodone HCl 10 MG TABS Take 1 tablet (10 mg total) by mouth every 6 (six) hours as needed. 60 tablet 0  . acetaminophen (TYLENOL) 500 MG tablet Take 500 mg by mouth 3 (three) times daily as needed.    Marland Kitchen dexamethasone (DECADRON) 4 MG tablet Take  1 tab two times a day the day before Alimta chemo, then take 2 tabs once a day for 3 days starting the day after chemo. (Patient not taking: Reported on 05/21/2018) 30 tablet 1  . docusate sodium (COLACE) 100 MG capsule Take 1 capsule (100 mg total) by mouth daily as needed. (Patient not taking: Reported on 05/11/2018) 30 capsule 2  . ibuprofen (ADVIL,MOTRIN) 200 MG tablet Take 200 mg by mouth every 6 (six) hours as needed.    . lidocaine-prilocaine (EMLA) cream Apply  to affected area once (Patient not taking: Reported on 05/24/2018) 30 g 3  . ondansetron (ZOFRAN) 8 MG tablet Take 1 tablet (8 mg total) by mouth 2 (two) times daily as needed (Nausea or vomiting). Start if needed on the third day after chemotherapy. (Patient not taking: Reported on 05/24/2018) 30 tablet 1  . polyethylene glycol (MIRALAX / GLYCOLAX) packet Take 17 g by mouth daily.     No current facility-administered medications for this visit.     Review of Systems  Constitutional: Positive for malaise/fatigue and weight loss (down 2 pounds). Negative for chills, diaphoresis and fever.       Feels "pretty good".  HENT: Negative.  Negative for congestion, ear discharge, ear pain, nosebleeds, sinus pain, sore throat and tinnitus.   Eyes: Negative.  Negative for blurred vision, double vision, photophobia, pain, discharge and redness.  Respiratory: Negative.  Negative for cough, hemoptysis, sputum production and shortness of breath.   Cardiovascular: Negative.  Negative for chest pain, palpitations, orthopnea, leg swelling and PND.  Gastrointestinal: Positive for constipation. Negative for abdominal pain, blood in stool, diarrhea, melena, nausea and vomiting.  Genitourinary: Negative for dysuria, frequency, hematuria and urgency.  Musculoskeletal: Positive for back pain (controlled; known osseous metastasis). Negative for falls, joint pain and myalgias.       Bulging disc in lumbosacral spine  Skin: Negative.  Negative for itching and rash.  Neurological: Positive for sensory change (chronic numbness to LEFT heel 2/2 spinal disc issues). Negative for dizziness, tremors, weakness and headaches.  Endo/Heme/Allergies: Does not bruise/bleed easily.  Psychiatric/Behavioral: Negative for depression and memory loss. The patient is not nervous/anxious and does not have insomnia.   All other systems reviewed and are negative.  Performance status (ECOG): 1 - Symptomatic but completely ambulatory  Vital  Signs BP 128/83 (BP Location: Left Arm, Patient Position: Sitting)   Pulse 76   Temp 97.9 F (36.6 C) (Tympanic)   Resp 18   Ht 5' 7"  (1.702 m)   Wt 165 lb 14.4 oz (75.3 kg)   SpO2 96%   BMI 25.98 kg/m   Physical Exam  Constitutional: He is oriented to person, place, and time and well-developed, well-nourished, and in no distress.  HENT:  Head: Normocephalic and atraumatic.  Mouth/Throat: Oropharynx is clear and moist and mucous membranes are normal. He has dentures.  Gray hair.  Mustache.  Eyes: Pupils are equal, round, and reactive to light. Conjunctivae and EOM are normal. No scleral icterus.  Glasses.  Blue eyes.  Neck: Normal range of motion. Neck supple. No JVD present.  Cardiovascular: Normal rate, regular rhythm, normal heart sounds and intact distal pulses. Exam reveals no gallop and no friction rub.  No murmur heard. Pulmonary/Chest: Effort normal and breath sounds normal. No respiratory distress. He has no wheezes. He has no rales.  Abdominal: Soft. Bowel sounds are normal. He exhibits no distension and no mass. There is no tenderness. There is no rebound and no guarding.  Musculoskeletal:  Normal range of motion. He exhibits no edema.       Thoracic back: He exhibits tenderness and pain.  Lymphadenopathy:    He has no cervical adenopathy.    He has no axillary adenopathy.       Right: No inguinal and no supraclavicular adenopathy present.       Left: No inguinal and no supraclavicular adenopathy present.  Neurological: He is alert and oriented to person, place, and time. Gait normal.  Skin: Skin is warm and dry. No rash noted. No erythema.  Psychiatric: Mood, affect and judgment normal.  Nursing note and vitals reviewed.   Infusion on 05/24/2018  Component Date Value Ref Range Status  . Magnesium 05/24/2018 2.4  1.7 - 2.4 mg/dL Final   Performed at [facility], [address] [place], [place] [address]  . Sodium 05/24/2018 138  135 - 145 mmol/L Final  .  Potassium 05/24/2018 4.5  3.5 - 5.1 mmol/L Final  . Chloride 05/24/2018 104  98 - 111 mmol/L Final  . CO2 05/24/2018 24  22 - 32 mmol/L Final  . Glucose, Bld 05/24/2018 126* 70 - 99 mg/dL Final  . BUN 05/24/2018 19  8 - 23 mg/dL Final  . Creatinine, Ser 05/24/2018 0.73  0.61 - 1.24 mg/dL Final  . Calcium 05/24/2018 9.4  8.9 - 10.3 mg/dL Final  . Total Protein 05/24/2018 7.4  6.5 - 8.1 g/dL Final  . Albumin 05/24/2018 3.5  3.5 - 5.0 g/dL Final  . AST 05/24/2018 29  15 - 41 U/L Final  . ALT 05/24/2018 27  0 - 44 U/L Final  . Alkaline Phosphatase 05/24/2018 72  38 - 126 U/L Final  . Total Bilirubin 05/24/2018 0.3  0.3 - 1.2 mg/dL Final  . GFR calc non Af Amer 05/24/2018 >60  >60 mL/min Final  . GFR calc Af Amer 05/24/2018 >60  >60 mL/min Final   Comment: (NOTE) The eGFR has been calculated using the CKD EPI equation. This calculation has not been validated in all clinical situations. eGFR's persistently <60 mL/min signify possible Chronic Kidney Disease.   Georgiann Hahn gap 05/24/2018 10  5 - 15 Final   Performed at [facility], [address] [place], [place] [address]  . WBC 05/24/2018 14.8* 4.0 - 10.5 K/uL Final  . RBC 05/24/2018 4.60  4.22 - 5.81 MIL/uL Final  . Hemoglobin 05/24/2018 13.3  13.0 - 17.0 g/dL Final  . HCT 05/24/2018 40.2  39.0 - 52.0 % Final  . MCV 05/24/2018 87.4  80.0 - 100.0 fL Final  . MCH 05/24/2018 28.9  26.0 - 34.0 pg Final  . MCHC 05/24/2018 33.1  30.0 - 36.0 g/dL Final  . RDW 05/24/2018 12.0  11.5 - 15.5 % Final  . Platelets 05/24/2018 445* 150 - 400 K/uL Final  . nRBC 05/24/2018 0.0  0.0 - 0.2 % Final  . Neutrophils Relative % 05/24/2018 83  % Final  . Neutro Abs 05/24/2018 12.2* 1.7 - 7.7 K/uL Final  . Lymphocytes Relative 05/24/2018 9  % Final  . Lymphs Abs 05/24/2018 1.3  0.7 - 4.0 K/uL Final  . Monocytes Relative 05/24/2018 8  % Final  . Monocytes Absolute 05/24/2018 1.2* 0.1 - 1.0 K/uL Final  . Eosinophils Relative 05/24/2018 0  % Final  .  Eosinophils Absolute 05/24/2018 0.0  0.0 - 0.5 K/uL Final  . Basophils Relative 05/24/2018 0  % Final  . Basophils Absolute 05/24/2018 0.1  0.0 - 0.1 K/uL Final  .  Immature Granulocytes 05/24/2018 0  % Final  . Abs Immature Granulocytes 05/24/2018 0.06  0.00 - 0.07 K/uL Final   Performed at [facility], [address] [place], [place] [address]    Assessment:  Jimmy West is a 67 y.o. male with a high-grade adenocarcinoma of the right lung s/p CT-guided biopsy of a RLL lung mass on 05/11/2018.  Pathology revealed an invasive high-grade adenocarcinoma, with predominantly solid growth pattern.  The neoplastic cells were TTF-1 (+), Napsin A (+), and P40 (+).  He has a T4 vertebral metastasis.  Clinical stage is T4N1M1.  There was not enough material for Foundation One testing.  PDL-1 revealed TPS 90%.  Chest CT angiogram on 04/27/2018 revealed no evidence of significant pulmonary embolus.  There was a 4.1 cm mass in the superior segment right lower lobe posteriorly causing obliteration of the in coming bronchus and mild postobstructive change. There were multiple additional bilateral pulmonary nodules (< 1 cm - 1.4 cm).   There were enlarged right hilar lymph nodes.  There was metastasis to the T4 vertebra with mild associated compression.  There was emphysematous changes in the lungs.   PET scan on 04/30/2018 revealed a 3.4 cm hypermetabolic RLL pulmonary mass (SUV 13.2), 11 mm pulmonary nodule in the LEFT lung (SUV 4.5), RIGHT paratracheal lymph node (SUV 5.1), and hypermetabolic activity within the T4 vertebral body (SUV 10.2).   Thoracic spine MRI on 05/03/2018 revealed T4 metastasis with a 40% pathologic compression deformity and 5 mm of retropulsion of the vertebral body.  Retropulsion results in mild spinal canal stenosis and mild bilateral C4-5 foraminal stenosis.  There was paravertebral soft tissue thickening from mid T3 to mid T5, likely representing edema related to the  pathologic compression deformity vs. possible extraosseous extension of the neoplasm.  There were no additional thoracic spinal metastases noted.   Head MRI on 05/03/2018 revealed no intracranial metastatic disease.  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  Incidental mention made of mild paranasal sinus disease.  Symptomatically, patient is doing well overall. He denies any acute concerns. Patient continues to note fatigue. Pain well managed on current regimen. He experienced constipation over the weekend, which was relieved with the recommended Senokot. No B symptoms or infections. Exam stable.  WBC 14,800 ([place] 12,200).  Platelets 445,000.  Plan: 1. Labs today:  CBC with diff, CMP, Mg, TSH. 2. High-grade adenocarcinoma of the RIGHT lung  Discuss Foundation One testing.   Unclear is testing can be performed due to insufficient tissue sample.   Discuss repeat biopsy to obtain additional tissue - patient declines at this time.   Review results from PDL 1 testing.  TPS 90%.  Discussed plans for change in therapy to single agent pembrolizumab.   Discuss use of pembrolizumab monotherapy in PDL-1 tumors >= 50%.  KEYNOTE-024 trial (305 patients) compared pembrolizumab alone vs platinum doublet.  PFS at 11.2 months was 10.3 months vs 6 months and RR 45% vs 28%.  Median response duration was not reached in pembrolizumab vs 6.3 months in chemotherapy arm.  At 25 months follow-up, OS was 30 months vs 14.2 months.  Discuss KEYNOTE-042 (1274 patients in 32 countries) phase III randomization trial between pembrolizumab versus chemo in untreated PD-L1 expressing tumors (Lancet 2019). 47% of patients had a TPS score of >= 50%.  Overall survival was significantly  longer in the pembrolizumab group than in the chemotherapy group in all  three TPS populations (?50% hazard ratio 069; ?20% 077, and ?1% 081. The median surival by TPS population were 200 months (95% CI 154-249) for pembrolizumab vs 122 months (104-142) for chemotherapy, 177 months (153-221) vs 130 months (116-153), and 167 months (139-197) vs 121 months (113-133), respectively. Treatment-related adverse events of grade 3 or worse occurred in (18%) of 636 treated patients in the pembrolizumab group and in (41%) of 615 in the chemotherapy group and led to death in 13 (2%) and 14 (2%) patients.  Data confirms improvement in survival in pembrolizumab + chemotherapy vs chemotherapy, no trial has evaluated pembrolizumab + chemotherapy vs pembrolizumab alone.  Cross-trial comparisons between KEYNOTE-024 and the PD-L1-high subgroup of KEYNOTE-189 suggest comparable outcomes between pembrolizumab and chemotherapy and pembrolizumab alone among those with PD-L1-high tumors (9-monthOS of 70% in each trial).  Head-to-head comparisons needed to validate this hypothesis, since the corresponding chemotherapy control arms experienced different 18-monthurvivals (55% and 48% in KEYNOTE-024 and 189 with the PD-L1-high subgroup).  Pembrolizumab monotherapy can be considered as compared to pembrolizumab + chemotherapy for most patients with PD-L1-high tumors, given better tolerability.  However, decision based on tumor burden and pace of progression.  In addition, this would allow the option of using a platinum-based doublet in the second-line setting.  In light of the fact that he will not be receiving Alimta, patient advised to discontinue steroids, [id number] folic acid.  3. Osseous metastases  Review known findings from recent MRI  T4 osseous metastasis with a 40% pathologic compression deformity.   Mild spinal canal stenosis and mild bilateral C4-5 foraminal stenosis.  Paravertebral soft tissue thickening from T3-T5, likely representing edema from pathologic  compression deformity versus possible extraosseous extension of the neoplasm.  Review plans for monthly denosumab (Xgeva) injections to prevent further bony metastasis.  Patient is edentulous, and has no known gingival issues.   Will plan on denosumab injection with nadir assessment.  4. Pain and symptom management  Followed by palliative medicine (JMerrily Peworders, NP).   Continues on Roxicodone 10-20 mg q6h PRN and schedule APAP/IBU combination therapy.   Patient has a sufficient supply of antiemetics and pain medications at home to use on a PRN basis. Continue as prescribed.  5. RTC on 06/03/2018 for MD assessment, labs (CBC with diff, CMP), and XgToney SangNP  05/24/2018, 9:56 AM   I saw and evaluated the patient, participating in the key portions of the service and reviewing pertinent diagnostic studies and records.  I reviewed the nurse practitioner's note and agree with the findings and the plan.  The assessment and plan were discussed with the patient.  Multiple questions were asked by the patient and answered.   MeNolon StallsMD 05/24/2018,9:56 AM

## 2018-05-24 NOTE — Progress Notes (Signed)
Patient states he had diarrhea on Saturday but resolved now

## 2018-05-24 NOTE — Progress Notes (Signed)
[place]  Telephone:([phone] [phone]  Patient Care Team: Jimmy Lick, NP as PCP - General (Nurse Practitioner) Telford Nab, RN as Registered Nurse Burlene Arnt Gerda Diss, MD as Attending Physician (Emergency Medicine)   Name of the patient: Jimmy West  [id number]  February 02, 1951   Date of visit: 05/24/18  Diagnosis-stage IV non-small cell lung cancer  Chief complaint/Reason for visit- Initial Meeting for [facility], preparing for starting chemotherapy  Heme/Onc history: 67 y.o. male with multiple medical problems including presumed metastatic lung cancer.  Patient was noted to have pulmonary nodules on recent chest x-ray from PCP.  He was subsequently referred to oncology for work-up and saw Dr. Mike West on 04/30/2018.  PET scan revealed several hypermetabolic pulmonary nodules and activity within the T4 vertebral body.  Follow-up MRI of the spine revealed T4 metastasis with pathologic compression deformity and mild spinal canal stenosis with probable edema and extraosseous extension of neoplasm from mid T3 to mid T5.     Interval history-  66 yo man who presents to chemo care clinic today for initial meeting in preparation for starting chemotherapy. I introduced the chemo care clinic and we discussed that the role of the clinic is to assist those who are at an increased risk of emergency room visits and/or complications during the course of chemotherapy treatment. We discussed that the increased risk takes into account factors such as age, performance status, and co-morbidities. We also discussed that for some, this might include barriers to care such as not having a primary care provider, lack of insurance/transportation, or not being able to afford medications. We discussed that the goal of the program is to help prevent unplanned ER visits and help reduce complications during chemotherapy. We do this by  discussing specific risk factors to each individual and identifying ways that we can help improve these risk factors and reduce barriers to care.   ECOG FS:1 - Symptomatic but completely ambulatory  Review of systems- ROS   Current treatment- Pembrolizumab      Pemetrexed      Carboplatin   No Known Allergies   Past Medical History:  Diagnosis Date  . Bulging lumbar disc   . Heart attack [facility])      Past Surgical History:  Procedure Laterality Date  . CARDIAC CATHETERIZATION     two stents  . KNEE SURGERY Left   . PORTA CATH INSERTION N/A 05/21/2018   Procedure: PORTA CATH INSERTION;  Surgeon: Jimmy Huxley, MD;  Location: [place] [facility];  Service: Cardiovascular;  Laterality: N/A;    Social History   Socioeconomic History  . Marital status: Single    Spouse name: Not on file  . Number of children: Not on file  . Years of education: Not on file  . Highest education level: Not on file  Occupational History  . Not on file  Social Needs  . Financial resource strain: Not on file  . Food insecurity:    Worry: Not on file    Inability: Not on file  . Transportation needs:    Medical: Not on file    Non-medical: Not on file  Tobacco Use  . Smoking status: Former Smoker    Packs/day: 1.50    Years: 15.00    Pack years: 22.50    Types: Cigarettes    Last attempt to quit: 11/03/2003    Years since quitting: 14.5  . Smokeless tobacco: Never Used  Substance and Sexual Activity  . Alcohol use: Yes    Alcohol/week: 15.0 standard drinks    Types: 15 Shots of liquor per week    Comment: occasionally  . Drug use: No  . Sexual activity: Not Currently  Lifestyle  . Physical activity:    Days per week: Not on file    Minutes per session: Not on file  . Stress: Not on file  Relationships  . Social connections:    Talks on phone: Not on file    Gets together: Not on file    Attends religious service: Not on file    Active member of club or organization: Not on  file    Attends meetings of clubs or organizations: Not on file    Relationship status: Not on file  . Intimate partner violence:    Fear of current or ex partner: Not on file    Emotionally abused: Not on file    Physically abused: Not on file    Forced sexual activity: Not on file  Other Topics Concern  . Not on file  Social History Narrative  . Not on file    Family History  Problem Relation Age of Onset  . Heart failure Father   . Cancer Maternal Aunt   . Heart failure Maternal Uncle   . Dementia Paternal Grandmother   . Cancer Maternal Aunt      Current Outpatient Medications:  .  acetaminophen (TYLENOL) 500 MG tablet, Take 500 mg by mouth 3 (three) times daily as needed., Disp: , Rfl:  .  aspirin EC 81 MG tablet, Take 81 mg by mouth daily., Disp: , Rfl:  .  dexamethasone (DECADRON) 4 MG tablet, Take 1 tab two times a day the day before Alimta chemo, then take 2 tabs once a day for 3 days starting the day after chemo. (Patient not taking: Reported on 05/21/2018), Disp: 30 tablet, Rfl: 1 .  docusate sodium (COLACE) 100 MG capsule, Take 1 capsule (100 mg total) by mouth daily as needed. (Patient not taking: Reported on 05/11/2018), Disp: 30 capsule, Rfl: 2 .  folic acid (FOLVITE) 1 MG tablet, Take 1 tablet (1 mg total) by mouth daily., Disp: 90 tablet, Rfl: 1 .  ibuprofen (ADVIL,MOTRIN) 200 MG tablet, Take 200 mg by mouth every 6 (six) hours as needed., Disp: , Rfl:  .  lidocaine-prilocaine (EMLA) cream, Apply to affected area once (Patient not taking: Reported on 05/24/2018), Disp: 30 g, Rfl: 3 .  ondansetron (ZOFRAN) 8 MG tablet, Take 1 tablet (8 mg total) by mouth 2 (two) times daily as needed (Nausea or vomiting). Start if needed on the third day after chemotherapy. (Patient not taking: Reported on 05/24/2018), Disp: 30 tablet, Rfl: 1 .  Oxycodone HCl 10 MG TABS, Take 1 tablet (10 mg total) by mouth every 6 (six) hours as needed., Disp: 60 tablet, Rfl: 0 .  polyethylene glycol  (MIRALAX / GLYCOLAX) packet, Take 17 g by mouth daily., Disp: , Rfl:   Physical exam: There were no vitals filed for this visit. Physical Exam   CMP Latest Ref Rng & Units 05/24/2018  Glucose 70 - 99 mg/dL 126(H)  BUN 8 - 23 mg/dL 19  Creatinine 0.61 - 1.24 mg/dL 0.73  Sodium 135 - 145 mmol/L 138  Potassium 3.5 - 5.1 mmol/L 4.5  Chloride 98 - 111 mmol/L 104  CO2 22 - 32 mmol/L 24  Calcium 8.9 - 10.3 mg/dL 9.4  Total Protein 6.5 - 8.1 g/dL  7.4  Total Bilirubin 0.3 - 1.2 mg/dL 0.3  Alkaline Phos 38 - 126 U/L 72  AST 15 - 41 U/L 29  ALT 0 - 44 U/L 27   CBC Latest Ref Rng & Units 05/24/2018  WBC 4.0 - 10.5 K/uL 14.8(H)  Hemoglobin 13.0 - 17.0 g/dL 13.3  Hematocrit 39.0 - 52.0 % 40.2  Platelets 150 - 400 K/uL 445(H)    No images are attached to the encounter.  Dg Chest 2 View  Result Date: 04/27/2018 CLINICAL DATA:  Persistent upper chest pain radiating around to the back. Patient completed a course of antibiotics and steroids for pneumonia. EXAM: CHEST - 2 VIEW COMPARISON:  04/14/2018 FINDINGS: Heart and mediastinal contours are stable. Persistent nodular opacities in both upper lobes with ill-defined parenchymal opacity in the infrahilar medial right thorax persist. Given lack of significant change despite antibiotics, differential considerations should include neoplasm. Degenerative changes are present along the dorsal spine. No definite osteolytic or blastic disease identified radiographically. IMPRESSION: No significant change in bilateral upper lobe nodular densities and right infrahilar masslike opacity of the lungs. Patient is scheduled for CT of the chest on Thursday. Electronically Signed   By: Ashley Royalty M.D.   On: 04/27/2018 20:29   Ct Angio Chest Pe W And/or Wo Contrast  Result Date: 04/27/2018 CLINICAL DATA:  Patient is finishing trial of antibiotics and steroids for pneumonia. Follow-up with primary care physician yesterday and schedule for CT on Thursday but has  persistent upper chest pain radiating into the back. EXAM: CT ANGIOGRAPHY CHEST WITH CONTRAST TECHNIQUE: Multidetector CT imaging of the chest was performed using the standard protocol during bolus administration of intravenous contrast. Multiplanar CT image reconstructions and MIPs were obtained to evaluate the vascular anatomy. CONTRAST:  152mL ISOVUE-370 IOPAMIDOL (ISOVUE-370) INJECTION 76% COMPARISON:  Chest radiograph 04/27/2018 FINDINGS: Cardiovascular: There is good opacification of the central and segmental pulmonary arteries. No focal filling defects. No evidence of significant pulmonary embolus. Normal heart size. No pericardial effusion. Coronary artery calcifications. Normal caliber thoracic aorta with calcification. No aortic dissection. Mediastinum/Nodes: Small esophageal hiatal hernia. Esophagus is decompressed. Prominent right hilar lymph nodes measuring up to about 1.4 cm diameter. No pathologic mediastinal lymphadenopathy. Lungs/Pleura: Evaluation is limited due to motion artifact. Emphysematous changes throughout the lungs. There is a mass lesion in the superior segment right lower lobe posteriorly measuring 4.1 by 3.1 cm. This is causing obliteration of the in coming bronchus with mild postobstructive change inferiorly. Multiple bilateral smaller pulmonary nodules are demonstrated. These range in size from less than 1 cm up to a maximum of about 14 mm in diameter. No consolidation. No pleural effusions. No pneumothorax. Upper Abdomen: No adrenal gland nodules. Small cyst in the upper pole of the right kidney. Spleen is incompletely imaged but appears enlarged. Musculoskeletal: Degenerative changes in the spine. Destructive lesion in the T4 vertebra with mild compression and adjacent soft tissue infiltration. No additional focal bone lesions are demonstrated. Review of the MIP images confirms the above findings. IMPRESSION: 1. No evidence of significant pulmonary embolus. 2. 4.1 cm mass in the  superior segment right lower lobe posteriorly causing obliteration of the in coming bronchus and mild postobstructive change. Multiple additional bilateral pulmonary nodules. Enlarged right hilar lymph nodes. 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 3. 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  Mild associated compression. 4. Emphysematous changes in the lungs. Aortic Atherosclerosis (ICD10-I70.0) and Emphysema (ICD10-J43.9). Electronically Signed   By: Lucienne Capers M.D.   On:  04/27/2018 21:33   Mr Jeri Cos YJ Contrast  Result Date: 05/03/2018 CLINICAL DATA:  67 y/o  M; lung cancer with T4 lesion on PET-CT. EXAM: MRI HEAD WITHOUT AND WITH CONTRAST TECHNIQUE: Multiplanar, multiecho pulse sequences of the brain and surrounding structures were obtained without and with intravenous contrast. CONTRAST:  7.5 cc Gadavist COMPARISON:  Concurrent MRI of the thoracic spine. 04/30/2018 PET-CT. FINDINGS: Brain: No acute infarction, hemorrhage, hydrocephalus, or extra-axial collection. There are 3 small chronic cortical infarctions within the left parietal lobe and chronic lacunar infarct within the right caudate head. Several nonspecific T2 FLAIR hyperintensities in subcortical and periventricular white matter are compatible with mild chronic microvascular ischemic changes for age. Mild volume loss of the brain. After administration of intravenous contrast there is no abnormal enhancement of the brain. No focal mass effect. Vascular: Normal flow voids. Skull and upper cervical spine: Normal marrow signal. Sinuses/Orbits: Mild-to-moderate frontal, ethmoid, and sphenoid sinus mucosal thickening. No abnormal signal of the mastoid air cells. Orbits are unremarkable. Other: None. IMPRESSION: 1. No intracranial metastatic disease identified. 2. Mild chronic microvascular ischemic changes and volume loss of the brain. 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 3. Mild paranasal sinus disease. Electronically Signed   By: Kristine Garbe M.D.   On: 05/03/2018 22:44   Mr Thoracic Spine W Wo Contrast  Result Date: 05/03/2018 CLINICAL DATA:  67 y/o  M; lung cancer with T4 lesion on PET-CT. EXAM: MRI THORACIC WITHOUT AND WITH CONTRAST TECHNIQUE: Multiplanar and multiecho pulse sequences of the thoracic spine were obtained without and with intravenous contrast. CONTRAST:  7.5 cc Gadavist COMPARISON:  04/30/2018 PET-CT.  Concurrent MRI of the head. FINDINGS: MRI THORACIC SPINE FINDINGS Alignment:  Physiologic. Vertebrae: T4 vertebral body and bilateral pedicle enhancing metastasis. Pathologic compression of the T4 vertebral body with 40% loss of vertebral body height. 5 mm of bony retropulsion of the T4 vertebral body with minimal contact on the anterior cord and mild canal stenosis. No cord compression. Surrounding paravertebral soft tissue thickening and enhancement as well as enhancement in the anterior epidural space from mid T3 to mid T5. No additional enhancing lesion of the thoracic spine, compression deformity, or findings of discitis is identified. Cord:  Normal signal and morphology. Paraspinal and other soft tissues: As above. Multiple scattered pulmonary nodules and right lower lobe mass are stable given differences in technique in comparison with the prior PET-CT where they are better characterize. Disc levels: Retropulsion of the T4 vertebral body results in mild bilateral T4-5 neural foraminal stenosis and mild canal stenosis. Mild multilevel disc desiccation and loss of intervertebral disc space height as well as small anterior marginal osteophytes. Prominent ligamentum flavum hypertrophy at the T9-10 level. No significant disc displacement, foraminal stenosis, or canal stenosis at additional levels. IMPRESSION: 1. T4 metastasis with 40% pathologic compression deformity and 5 mm of retropulsion of the vertebral body.  Retropulsion results in mild spinal canal stenosis and mild bilateral C4-5 foraminal stenosis. 2. Paravertebral soft tissue thickening and enhancement as well as anterior epidural enhancement from mid T3 to mid T5 likely represents edema related to the pathologic compression deformity and possibly extra osseous extension of neoplasm. 3. No additional thoracic spine metastasis identified. 4. Multiple scattered pulmonary nodules and right lower lobe mass are stable given differences in technique in comparison with the prior PET-CT where they are better characterized. Electronically Signed   By: Kristine Garbe M.D.   On:  05/03/2018 22:57   Nm Pet Image Initial (pi) Skull Base To Thigh  Result Date: 04/30/2018 CLINICAL DATA:  Initial treatment strategy for pulmonary mass. EXAM: NUCLEAR MEDICINE PET SKULL BASE TO THIGH TECHNIQUE: 9.0 mCi F-18 FDG was injected intravenously. Full-ring PET imaging was performed from the skull base to thigh after the radiotracer. CT data was obtained and used for attenuation correction and anatomic localization. Fasting blood glucose: Not provided mg/dl COMPARISON:  04/27/2018 FINDINGS: Mediastinal blood pool activity: SUV max 2.2 NECK: No hypermetabolic lymph nodes in the neck. Incidental CT findings: none CHEST: Mass in the superior segment of the RIGHT lower lobe measures 3.4 cm with intense metabolic activity (SUV max 13.2) Bilateral smaller hypermetabolic pulmonary nodules. For example LEFT lobe pulmonary nodule measuring 11 mm with SUV max equal 4.5. Hypermetabolic RIGHT lower paratracheal lymph node with SUV max equal 5.1. No hypermetabolic supraclavicular nodes or axillary nodes. Incidental CT findings: none ABDOMEN/PELVIS: No abnormal metabolic activity in the liver. No hypermetabolic adrenal glands. Thickening of the LEFT adrenal gland without hypermetabolic activity. This lesion has low attenuation consistent with benign adenoma. No hypermetabolic upper abdominal  lymph nodes. No hypermetabolic retroperitoneal pelvic lymph nodes. Incidental CT findings: none SKELETON: Hypermetabolic lesion at the T4 vertebral body lesion level. There is lytic lesion on comparison CT. Activity is intense with SUV max equal 10.2. No additional skeletal metastasis identified. Incidental CT findings: none IMPRESSION: 1. Hypermetabolic RIGHT lower lobe mass consists with primary bronchogenic carcinoma. 2. Bilateral hypermetabolic pulmonary nodules consistent pulmonary metastasis. 3. Hypermetabolic mediastinal lymph nodes consistent with local nodal metastasis. 4. Hypermetabolic solitary metastasis the T4 vertebral body level. Electronically Signed   By: Suzy Bouchard M.D.   On: 04/30/2018 16:21   Ct Biopsy  Result Date: 05/11/2018 CLINICAL DATA:  4 cm right lower lobe lung mass with multiple bilateral satellite pulmonary nodules, metastatic mediastinal lymphadenopathy and T4 vertebral metastasis. The patient presents for biopsy of the right lower lobe lung mass. EXAM: CT GUIDED CORE BIOPSY OF RIGHT LOWER LOBE LUNG MASS ANESTHESIA/SEDATION: 3.0 mg IV Versed; 100 mcg IV Fentanyl Total Moderate Sedation Time:  32 minutes. The patient's level of consciousness and physiologic status were continuously monitored during the procedure by Radiology nursing. PROCEDURE: The procedure risks, benefits, and alternatives were explained to the patient. Questions regarding the procedure were encouraged and answered. The patient understands and consents to the procedure. A time-out was performed prior to initiating the procedure. CT was performed through the mid to lower chest in a prone position. The right posterior chest wall was prepped with chlorhexidine in a sterile fashion, and a sterile drape was applied covering the operative field. A sterile gown and sterile gloves were used for the procedure. Local anesthesia was provided with 1% Lidocaine. Under CT guidance, a 78 gauge trocar needle was advanced  from a posterior right paraspinous approach to the level of a right lower lobe lung mass. After confirming needle tip position, coaxial 18 gauge core biopsy samples were obtained. Two separate core biopsy samples were submitted in formalin. After the procedure, the outer needle was retracted as aspiration was performed. Additional CT was then performed after needle removal. COMPLICATIONS: None FINDINGS: Dominant oval mass in the medial aspect of the right lower lobe now measures approximately 3.2 x 4.4 cm. Additional satellite nodules are present with 2 separate posterior 1.5 cm right upper lobe nodules and a 1.4 cm nodule in the superior segment of the left lower lobe. Solid tissue was obtained from the right lower lobe  mass. After the procedure, some air was able to be aspirated as the outer needle was retracted. Post biopsy imaging shows no discernible pneumothorax. A follow-up chest x-ray will be performed during recovery to evaluate for any air leak. IMPRESSION: CT-guided core biopsy performed of a right lower lobe lung mass showing slight interval enlargement since prior imaging with maximal diameter of 4.4 cm. Additional satellite nodules are again noted as well in the right upper lobe and left lower lobe demonstrating slight interval enlargement. Electronically Signed   By: Aletta Edouard M.D.   On: 05/11/2018 11:04   Dg Chest Port 1 View  Result Date: 05/11/2018 CLINICAL DATA:  Status post CT-guided percutaneous biopsy of right lower lobe lung mass. EXAM: PORTABLE CHEST 1 VIEW COMPARISON:  Imaging during biopsy procedure earlier today. FINDINGS: The heart size and mediastinal contours are within normal limits. There is no evidence of pulmonary edema, consolidation, pneumothorax or pleural fluid. Bilateral upper lobe pulmonary nodules and right-sided paraspinal density present related to known bilateral masses. The visualized skeletal structures are unremarkable. IMPRESSION: No acute findings or  evidence of pneumothorax following right lung biopsy. Electronically Signed   By: Aletta Edouard M.D.   On: 05/11/2018 12:13     Assessment and plan- Patient is a 67 y.o. male who presents to [facility] for initial meeting in preparation for starting chemotherapy for the treatment of stage IV non-small cell lung cancer.    1. Cancer-non-small cell lung cancer with plan to initiate carboplatin, Alimta, and pembrolizumab.  2. Chemo Care Clinic/High Risk for ER/Hospitalization during chemotherapy- We discussed the role of the chemo care clinic and identified patient specific risk factors. I discussed that patient was identified as high risk primarily based on: Prior ED utilization.  Patient has established with Dr. Janae Bridgeman and Henrine Screws, NP as PCP.  Does not have follow-up scheduled but can follow-up as needed.  Patient is also followed by me in the palliative care clinic.  He reports being stable today without any acute complaints or issues.  Patient is accompanied by his friend.  All questions answered to the best my ability.  3. Social Determinants of Health Performance Status/Activity -patient fully independent.  Still working Alcohol Use -denies Depression -denies Financial Needs-denies Food Insecurity-denies Housing-he has at home. Social Connections-patient is single.  However, he has several friends who are very involved in his care Transportation-not an issue  4. Co-morbidities Complicating Care: NA    We also discussed the role of the Symptom Management Clinic at [facility] and methods of contacting clinic/provider. He denies needing specific assistance at this time and He will be followed by Hildred Alamin, Therapist, sports (Nurse Navigator).    Visit Diagnosis No diagnosis found.   Patient expressed understanding and was in agreement with this plan. He also understands that He can call clinic at any time with any questions, concerns, or complaints.   A total of (15) minutes of face-to-face  time was spent with this patient with greater than 50% of that time in counseling and care-coordination.  [name], PhD, NP-C [place] [facility]

## 2018-05-24 NOTE — Progress Notes (Signed)
  Oncology Nurse Navigator Documentation  Navigator Location: [facility]-Med Onc (05/24/18 1100)   )Navigator Encounter Type: Treatment (05/24/18 1100)                   Treatment Initiated Date: 05/24/18 (05/24/18 1100) Patient Visit Type: MedOnc (05/24/18 1100) Treatment Phase: First Chemo Tx (05/24/18 1100) Barriers/Navigation Needs: No barriers at this time (05/24/18 1100)   Interventions: None required (05/24/18 1100)         met with patient and his friend, [name], prior to first treatment today. All questions answered at the time of visit. Nothing further needed at this time.             Time Spent with Patient: 30 (05/24/18 1100)

## 2018-05-27 ENCOUNTER — Encounter: Payer: Self-pay | Admitting: Hematology and Oncology

## 2018-06-01 DIAGNOSIS — C3490 Malignant neoplasm of unspecified part of unspecified bronchus or lung: Secondary | ICD-10-CM | POA: Diagnosis not present

## 2018-06-03 ENCOUNTER — Other Ambulatory Visit: Payer: Self-pay | Admitting: *Deleted

## 2018-06-03 ENCOUNTER — Inpatient Hospital Stay (HOSPITAL_BASED_OUTPATIENT_CLINIC_OR_DEPARTMENT_OTHER): Payer: Medicare Other | Admitting: Hospice and Palliative Medicine

## 2018-06-03 ENCOUNTER — Inpatient Hospital Stay (HOSPITAL_BASED_OUTPATIENT_CLINIC_OR_DEPARTMENT_OTHER): Payer: Medicare Other | Admitting: Hematology and Oncology

## 2018-06-03 ENCOUNTER — Encounter: Payer: Self-pay | Admitting: Hematology and Oncology

## 2018-06-03 ENCOUNTER — Inpatient Hospital Stay: Payer: Medicare Other

## 2018-06-03 VITALS — BP 135/84 | HR 78 | Temp 97.3°F | Resp 18 | Wt 163.0 lb

## 2018-06-03 DIAGNOSIS — C7801 Secondary malignant neoplasm of right lung: Secondary | ICD-10-CM | POA: Diagnosis not present

## 2018-06-03 DIAGNOSIS — G893 Neoplasm related pain (acute) (chronic): Secondary | ICD-10-CM

## 2018-06-03 DIAGNOSIS — C7951 Secondary malignant neoplasm of bone: Secondary | ICD-10-CM

## 2018-06-03 DIAGNOSIS — Z515 Encounter for palliative care: Secondary | ICD-10-CM

## 2018-06-03 DIAGNOSIS — C3431 Malignant neoplasm of lower lobe, right bronchus or lung: Secondary | ICD-10-CM

## 2018-06-03 DIAGNOSIS — C7802 Secondary malignant neoplasm of left lung: Secondary | ICD-10-CM

## 2018-06-03 DIAGNOSIS — Z79899 Other long term (current) drug therapy: Secondary | ICD-10-CM | POA: Diagnosis not present

## 2018-06-03 DIAGNOSIS — Z87891 Personal history of nicotine dependence: Secondary | ICD-10-CM

## 2018-06-03 DIAGNOSIS — Z5111 Encounter for antineoplastic chemotherapy: Secondary | ICD-10-CM | POA: Diagnosis not present

## 2018-06-03 LAB — COMPREHENSIVE METABOLIC PANEL
ALT: 24 U/L (ref 0–44)
AST: 25 U/L (ref 15–41)
Albumin: 3.8 g/dL (ref 3.5–5.0)
Alkaline Phosphatase: 78 U/L (ref 38–126)
Anion gap: 8 (ref 5–15)
BUN: 12 mg/dL (ref 8–23)
CO2: 25 mmol/L (ref 22–32)
Calcium: 9.5 mg/dL (ref 8.9–10.3)
Chloride: 105 mmol/L (ref 98–111)
Creatinine, Ser: 1.08 mg/dL (ref 0.61–1.24)
GFR calc Af Amer: >60 mL/min (ref >60)
GFR calc non Af Amer: >60 mL/min (ref >60)
Glucose, Bld: 102 mg/dL — ABNORMAL HIGH (ref 70–99)
Potassium: 4.4 mmol/L (ref 3.5–5.1)
Sodium: 138 mmol/L (ref 135–145)
Total Bilirubin: 0.8 mg/dL (ref 0.3–1.2)
Total Protein: 7.5 g/dL (ref 6.5–8.1)

## 2018-06-03 LAB — CBC WITH DIFFERENTIAL/PLATELET
Abs Immature Granulocytes: 0.03 10*3/uL (ref 0.00–0.07)
Basophils Absolute: 0.1 10*3/uL (ref 0.0–0.1)
Basophils Relative: 1 %
Eosinophils Absolute: 0.7 10*3/uL — ABNORMAL HIGH (ref 0.0–0.5)
Eosinophils Relative: 8 %
HCT: 40.7 % (ref 39.0–52.0)
Hemoglobin: 13.1 g/dL (ref 13.0–17.0)
Immature Granulocytes: 0 %
Lymphocytes Relative: 12 %
Lymphs Abs: 1 10*3/uL (ref 0.7–4.0)
MCH: 28.2 pg (ref 26.0–34.0)
MCHC: 32.2 g/dL (ref 30.0–36.0)
MCV: 87.5 fL (ref 80.0–100.0)
Monocytes Absolute: 0.6 10*3/uL (ref 0.1–1.0)
Monocytes Relative: 8 %
Neutro Abs: 5.8 10*3/uL (ref 1.7–7.7)
Neutrophils Relative %: 71 %
Platelets: 313 10*3/uL (ref 150–400)
RBC: 4.65 MIL/uL (ref 4.22–5.81)
RDW: 12.4 % (ref 11.5–15.5)
WBC: 8.3 10*3/uL (ref 4.0–10.5)
nRBC: 0 % (ref 0.0–0.2)

## 2018-06-03 LAB — TSH: TSH: 1.055 u[IU]/mL (ref 0.350–4.500)

## 2018-06-03 MED ORDER — DENOSUMAB 120 MG/1.7ML ~~LOC~~ SOLN
120.0000 mg | Freq: Once | SUBCUTANEOUS | Status: AC
  Administered 2018-06-03: 120 mg via SUBCUTANEOUS
  Filled 2018-06-03: qty 1.7

## 2018-06-03 MED ORDER — OXYCODONE HCL 10 MG PO TABS: 10.0000 mg | ORAL_TABLET | Freq: Four times a day (QID) | ORAL | 0 refills | Status: DC | PRN

## 2018-06-03 NOTE — Progress Notes (Signed)
[facility]  [phone] [phone]   Name: Jimmy West Date: 06/03/2018 MRN: [id number]  DOB: 05-Jan-1951  Patient Care Team: [name], NP as PCP - General (Nurse Practitioner) Telford Nab, RN as Registered Nurse [place], Gerda Diss, MD as Attending Physician (Emergency Medicine)    REASON FOR CONSULTATION: Palliative Care consult requested for this 67 y.o. male with multiple medical problems including presumed metastatic lung cancer.  Patient was noted to have pulmonary nodules on recent chest x-ray from PCP.  He was subsequently referred to oncology for work-up and saw Dr. Mike Gip on 04/30/2018.  PET scan revealed several hypermetabolic pulmonary nodules and activity within the T4 vertebral body.  Follow-up MRI of the spine revealed T4 metastasis with pathologic compression deformity and mild spinal canal stenosis with probable edema and extraosseous extension of neoplasm from mid T3 to mid T5. Patient was referred to the palliative care clinic to assist with symptom management and establishing treatment goals.   SOCIAL HISTORY:    Patient has not married and has no children.  He has a brother who lives out of state.  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  ADVANCE DIRECTIVES:  Does not have.  CODE STATUS: Full code  PAST MEDICAL HISTORY: Past Medical History:  Diagnosis Date  . Bulging lumbar disc   . Heart attack ([place])     PAST SURGICAL HISTORY:  Past Surgical History:  Procedure Laterality Date  . CARDIAC CATHETERIZATION     two stents  . KNEE SURGERY Left   . PORTA CATH INSERTION N/A 05/21/2018   Procedure: PORTA CATH INSERTION;  Surgeon: Algernon Huxley, MD;  Location: [facility];  Service: Cardiovascular;  Laterality: N/A;    HEMATOLOGY/ONCOLOGY HISTORY:    Primary cancer of right lower lobe of lung ([place])   04/26/2018 Initial  Diagnosis    Primary cancer of right lower lobe of lung ([place])    05/17/2018 -  Chemotherapy    The patient had pembrolizumab (KEYTRUDA) 200 mg in sodium chloride 0.9 % 50 mL chemo infusion, 200 mg, Intravenous, Once, 1 of 6 cycles Administration: 200 mg (05/24/2018)  for chemotherapy treatment.      ALLERGIES:  has No Known Allergies.  MEDICATIONS:  Current Outpatient Medications  Medication Sig Dispense Refill  . acetaminophen (TYLENOL) 500 MG tablet Take 500 mg by mouth 3 (three) times daily as needed.    Marland Kitchen aspirin EC 81 MG tablet Take 81 mg by mouth daily.    Marland Kitchen dexamethasone (DECADRON) 4 MG tablet Take 1 tab two times a day the day before Alimta chemo, then take 2 tabs once a day for 3 days starting the day after chemo. (Patient not taking: Reported on 05/21/2018) 30 tablet 1  . docusate sodium (COLACE) 100 MG capsule Take 1 capsule (100 mg total) by mouth daily as needed. 30 capsule 2  . folic acid (FOLVITE) 1 MG tablet Take 1 tablet (1 mg total) by mouth daily. (Patient not taking: Reported on 06/03/2018) 90 tablet 1  . ibuprofen (ADVIL,MOTRIN) 200 MG tablet Take 200 mg by mouth every 6 (six) hours as needed.    . lidocaine-prilocaine (EMLA) cream Apply to affected area once 30 g 3  . ondansetron (ZOFRAN) 8 MG tablet Take 1 tablet (8 mg total) by mouth 2 (two) times daily as needed (Nausea or vomiting). Start if needed on the third day after chemotherapy. (Patient  not taking: Reported on 05/24/2018) 30 tablet 1  . Oxycodone HCl 10 MG TABS Take 1 tablet (10 mg total) by mouth every 6 (six) hours as needed. 60 tablet 0  . polyethylene glycol (MIRALAX / GLYCOLAX) packet Take 17 g by mouth daily.     No current facility-administered medications for this visit.     VITAL SIGNS: There were no vitals taken for this visit. There were no vitals filed for this visit.  Estimated body mass index is 25.53 kg/m as calculated from the following:   Height as of 05/24/18: 5\' 7"  (1.702 m).   Weight  as of an earlier encounter on 06/03/18: 163 lb (73.9 kg).  LABS: CBC:    Component Value Date/Time   WBC 8.3 06/03/2018 0835   HGB 13.1 06/03/2018 0835   HGB 14.9 04/06/2014 0826   HCT 40.7 06/03/2018 0835   HCT 44.8 04/06/2014 0826   PLT 313 06/03/2018 0835   PLT 238 04/06/2014 0826   MCV 87.5 06/03/2018 0835   MCV 95 04/06/2014 0826   NEUTROABS 5.8 06/03/2018 0835   NEUTROABS 3.9 04/06/2014 0826   LYMPHSABS 1.0 06/03/2018 0835   LYMPHSABS 1.1 04/06/2014 0826   MONOABS 0.6 06/03/2018 0835   MONOABS 0.7 04/06/2014 0826   EOSABS 0.7 (H) 06/03/2018 0835   EOSABS 0.1 04/06/2014 0826   BASOSABS 0.1 06/03/2018 0835   BASOSABS 0.1 04/06/2014 0826   Comprehensive Metabolic Panel:    Component Value Date/Time   NA 138 06/03/2018 0835   NA 140 04/06/2014 0826   K 4.4 06/03/2018 0835   K 4.0 04/06/2014 0826   CL 105 06/03/2018 0835   CL 108 (H) 04/06/2014 0826   CO2 25 06/03/2018 0835   CO2 25 04/06/2014 0826   BUN 12 06/03/2018 0835   BUN 12 04/06/2014 0826   CREATININE 1.08 06/03/2018 0835   CREATININE 1.17 04/06/2014 0826   GLUCOSE 102 (H) 06/03/2018 0835   GLUCOSE 106 (H) 04/06/2014 0826   CALCIUM 9.5 06/03/2018 0835   CALCIUM 8.9 04/06/2014 0826   AST 25 06/03/2018 0835   AST 24 04/06/2014 0826   ALT 24 06/03/2018 0835   ALT 33 04/06/2014 0826   ALKPHOS 78 06/03/2018 0835   ALKPHOS 85 04/06/2014 0826   BILITOT 0.8 06/03/2018 0835   BILITOT 0.8 04/06/2014 0826   PROT 7.5 06/03/2018 0835   PROT 7.3 04/06/2014 0826   ALBUMIN 3.8 06/03/2018 0835   ALBUMIN 3.8 04/06/2014 0826    RADIOGRAPHIC STUDIES: Ct Biopsy  Result Date: 20-May-2018 CLINICAL DATA:  4 cm right lower lobe lung mass with multiple bilateral satellite pulmonary nodules, metastatic mediastinal lymphadenopathy and T4 vertebral metastasis. The patient presents for biopsy of the right lower lobe lung mass. EXAM: CT GUIDED CORE BIOPSY OF RIGHT LOWER LOBE LUNG MASS ANESTHESIA/SEDATION: 3.0 mg IV Versed;  100 mcg IV Fentanyl Total Moderate Sedation Time:  32 minutes. The patient's level of consciousness and physiologic status were continuously monitored during the procedure by Radiology nursing. PROCEDURE: The procedure risks, benefits, and alternatives were explained to the patient. Questions regarding the procedure were encouraged and answered. The patient understands and consents to the procedure. A time-out was performed prior to initiating the procedure. CT was performed through the mid to lower chest in a prone position. The right posterior chest wall was prepped with chlorhexidine in a sterile fashion, and a sterile drape was applied covering the operative field. A sterile gown and sterile gloves were used for the procedure. Local anesthesia  was provided with 1% Lidocaine. Under CT guidance, a 78 gauge trocar needle was advanced from a posterior right paraspinous approach to the level of a right lower lobe lung mass. After confirming needle tip position, coaxial 18 gauge core biopsy samples were obtained. Two separate core biopsy samples were submitted in formalin. After the procedure, the outer needle was retracted as aspiration was performed. Additional CT was then performed after needle removal. COMPLICATIONS: None FINDINGS: Dominant oval mass in the medial aspect of the right lower lobe now measures approximately 3.2 x 4.4 cm. Additional satellite nodules are present with 2 separate posterior 1.5 cm right upper lobe nodules and a 1.4 cm nodule in the superior segment of the left lower lobe. Solid tissue was obtained from the right lower lobe mass. After the procedure, some air was able to be aspirated as the outer needle was retracted. Post biopsy imaging shows no discernible pneumothorax. A follow-up chest x-ray will be performed during recovery to evaluate for any air leak. IMPRESSION: CT-guided core biopsy performed of a right lower lobe lung mass showing slight interval enlargement since prior imaging  with maximal diameter of 4.4 cm. Additional satellite nodules are again noted as well in the right upper lobe and left lower lobe demonstrating slight interval enlargement. Electronically Signed   By: Aletta Edouard M.D.   On: 05/11/2018 11:04   Dg Chest Port 1 View  Result Date: 05/11/2018 CLINICAL DATA:  Status post CT-guided percutaneous biopsy of right lower lobe lung mass. EXAM: PORTABLE CHEST 1 VIEW COMPARISON:  Imaging during biopsy procedure earlier today. FINDINGS: The heart size and mediastinal contours are within normal limits. There is no evidence of pulmonary edema, consolidation, pneumothorax or pleural fluid. Bilateral upper lobe pulmonary nodules and right-sided paraspinal density present related to known bilateral masses. The visualized skeletal structures are unremarkable. IMPRESSION: No acute findings or evidence of pneumothorax following right lung biopsy. Electronically Signed   By: Aletta Edouard M.D.   On: 05/11/2018 12:13    PERFORMANCE STATUS (ECOG) : 1 - Symptomatic but completely ambulatory  Review of Systems As noted above. Otherwise, a complete review of systems is negative.  Physical Exam General: NAD, frail appearing, thin Cardiovascular: regular rate and rhythm Pulmonary: clear ant fields Abdomen: soft, nontender, + bowel sounds GU: no suprapubic tenderness Extremities: no edema, no joint deformities Skin: no rashes Neurological: Weakness but otherwise nonfocal  IMPRESSION: Follow-up visit today in the clinic.  Pathology + adenocarcinoma. Patient is being treated with pembrolizumab.   Patient has some mild exertional shortness of breath but has not found it limiting his functioning. He denies fever or chills. Could be secondary to treatment vs primary effect from cancer. Will monitor.   Patient states pain is greatly improved. He is using about half the oxycodone that he was previously. Pain is no longer significantly impacting his functioning or quality  of life. Patient denies any concerns or adverse effects from the oxycodone. No constipation reported. Will refill oxycodone today.   ACP will need to be readdressed during a future visit.   PLAN: 1. Refill oxycodone today, 10mg  # 60, no refills 2. Continue ibuprofen/acetaminophen combo 3.  Will need to readdress ACP during a future visit 4.  RTC in 1 month.  Will try to coordinate next visit at time of appointment with Dr. Mike Gip.  Patient expressed understanding and was in agreement with this plan. He also understands that He can call clinic at any time with any questions, concerns, or complaints.  Time Total: 20 minutes  Visit consisted of counseling and education dealing with the complex and emotionally intense issues of symptom management and palliative care in the setting of serious and potentially life-threatening illness.Greater than 50%  of this time was spent counseling and coordinating care related to the above assessment and plan.  Signed by: [name], [place], NP-C, [place] (Work Cell)

## 2018-06-03 NOTE — Progress Notes (Signed)
[facility] day:  06/03/2018  Chief Complaint: Jimmy West is a 67 y.o. male with metastatic adenocarcinoma of the lung who is seen for assessment on day 11 s/p cycle #1 pembrolizumab.  HPI:  The patient was last seen in the medical oncology clinic on 05/24/2018.  At that time, he was doing well overall.  He denied any acute concerns. He continued to note fatigue.  Pain was well managed on current regimen. He experienced constipation over the weekend, which was relieved with the recommended Senokot. No B symptoms or infections.  Exam was stable.  WBC was 14,800 ([place] 12,200).  Platelets were 445,000.  He received cycle #1 pembrolizumab.   During the interim, he has done well.  He notes shortness of breath and cough.  He notes symptoms gradually over the past 3-4 weeks.  He denies any change after receiving pembrolizumab.  He has a little cough.  He denies any pleuritic chest pain.  He denies any lower extremity edema.  He notes a little fatigue.  He feels that his back is better.  He notes less pain.  He is taking 1/2 of the pain medications (4 pills instead of 8).   Past Medical History:  Diagnosis Date  . Bulging lumbar disc   . Heart attack [facility])     Past Surgical History:  Procedure Laterality Date  . CARDIAC CATHETERIZATION     two stents  . KNEE SURGERY Left   . PORTA CATH INSERTION N/A 05/21/2018   Procedure: PORTA CATH INSERTION;  Surgeon: Algernon Huxley, MD;  Location: [facility];  Service: Cardiovascular;  Laterality: N/A;    Family History  Problem Relation Age of Onset  . Heart failure Father   . Cancer Maternal Aunt   . Heart failure Maternal Uncle   . Dementia Paternal Grandmother   . Cancer Maternal Aunt     Social History:  reports that he quit smoking about 14 years ago. His smoking use included cigarettes. He has a 22.50 pack-year smoking history. He has never used smokeless tobacco. He reports that he  drinks about 15.0 standard drinks of alcohol per week. He reports that he does not use drugs. He started smoking at age 20.   He is smoking 1 1/2 packs/day.  Patient is employed as as full time Probation officer. Patient denies known exposures to radiation on toxins. The patient is alone today.  Allergies: No Known Allergies  Current Medications: Current Outpatient Medications  Medication Sig Dispense Refill  . acetaminophen (TYLENOL) 500 MG tablet Take 500 mg by mouth 3 (three) times daily as needed.    Marland Kitchen aspirin EC 81 MG tablet Take 81 mg by mouth daily.    Marland Kitchen docusate sodium (COLACE) 100 MG capsule Take 1 capsule (100 mg total) by mouth daily as needed. 30 capsule 2  . ibuprofen (ADVIL,MOTRIN) 200 MG tablet Take 200 mg by mouth every 6 (six) hours as needed.    . lidocaine-prilocaine (EMLA) cream Apply to affected area once 30 g 3  . Oxycodone HCl 10 MG TABS Take 1 tablet (10 mg total) by mouth every 6 (six) hours as needed. 60 tablet 0  . dexamethasone (DECADRON) 4 MG tablet Take 1 tab two times a day the day before Alimta chemo, then take 2 tabs once a day for 3 days starting the day after chemo. (Patient not taking: Reported on 05/21/2018) 30 tablet 1  . folic acid (FOLVITE) 1  MG tablet Take 1 tablet (1 mg total) by mouth daily. (Patient not taking: Reported on 06/03/2018) 90 tablet 1  . ondansetron (ZOFRAN) 8 MG tablet Take 1 tablet (8 mg total) by mouth 2 (two) times daily as needed (Nausea or vomiting). Start if needed on the third day after chemotherapy. (Patient not taking: Reported on 05/24/2018) 30 tablet 1  . polyethylene glycol (MIRALAX / GLYCOLAX) packet Take 17 g by mouth daily.     No current facility-administered medications for this visit.     Review of Systems  Constitutional: Positive for malaise/fatigue (slight) and weight loss (down 2 pounds). Negative for chills, diaphoresis and fever.  HENT: Negative.  Negative for congestion, ear discharge, ear pain, nosebleeds, sinus pain  and tinnitus.   Eyes: Negative.  Negative for blurred vision, double vision, photophobia, pain, discharge and redness.  Respiratory: Positive for cough (little) and shortness of breath (gradual over time). Negative for hemoptysis and sputum production.   Cardiovascular: Negative.  Negative for chest pain, palpitations, orthopnea, leg swelling and PND.  Gastrointestinal: Negative.  Negative for abdominal pain, blood in stool, constipation, diarrhea, melena, nausea and vomiting.  Genitourinary: Negative.  Negative for dysuria, frequency, hematuria and urgency.  Musculoskeletal: Positive for back pain (T4 bone metastasis). Negative for falls, joint pain, myalgias and neck pain.       Improved back pain.  Skin: Negative.  Negative for itching and rash.  Neurological: Positive for sensory change (chronic numbness to LEFT heel 2/2 spinal disk issues). Negative for dizziness, tremors, weakness and headaches.  Endo/Heme/Allergies: Negative.  Does not bruise/bleed easily.  Psychiatric/Behavioral: Negative for depression and memory loss. The patient is not nervous/anxious and does not have insomnia.   All other systems reviewed and are negative.  Performance status (ECOG): 1  Vital Signs BP 135/84 (BP Location: Left Arm, Patient Position: Sitting)   Pulse 78   Temp (!) 97.3 F (36.3 C) (Tympanic)   Resp 18   Wt 163 lb (73.9 kg)   SpO2 95%   BMI 25.53 kg/m   Physical Exam  Constitutional: He is oriented to person, place, and time and well-developed, well-nourished, and in no distress. No distress.  HENT:  Head: Normocephalic and atraumatic.  Mouth/Throat: Oropharynx is clear and moist and mucous membranes are normal. He has dentures. No oropharyngeal exudate.  Gray hair.  Mustache.  Eyes: Pupils are equal, round, and reactive to light. Conjunctivae and EOM are normal. No scleral icterus.  Glasses.  Blue eyes.  Neck: Normal range of motion. Neck supple. No JVD present.  Cardiovascular: Normal  rate, regular rhythm, normal heart sounds and intact distal pulses. Exam reveals no gallop and no friction rub.  No murmur heard. Pulmonary/Chest: Effort normal and breath sounds normal. No respiratory distress. He has no wheezes. He has no rales.  Abdominal: Soft. Bowel sounds are normal. He exhibits no distension and no mass. There is no tenderness. There is no rebound and no guarding.  Musculoskeletal: Normal range of motion. He exhibits no edema.       Thoracic back: He exhibits tenderness and pain.  Lymphadenopathy:    He has no cervical adenopathy.    He has no axillary adenopathy.       Right: No supraclavicular adenopathy present.       Left: No supraclavicular adenopathy present.  Neurological: He is alert and oriented to person, place, and time. Gait normal.  Skin: Skin is warm and dry. No rash noted. He is not diaphoretic. No erythema.  Psychiatric: Mood, affect and judgment normal.  Nursing note and vitals reviewed.   Appointment on 06/03/2018  Component Date Value Ref Range Status  . Sodium 06/03/2018 138  135 - 145 mmol/L Final  . Potassium 06/03/2018 4.4  3.5 - 5.1 mmol/L Final  . Chloride 06/03/2018 105  98 - 111 mmol/L Final  . CO2 06/03/2018 25  22 - 32 mmol/L Final  . Glucose, Bld 06/03/2018 102* 70 - 99 mg/dL Final  . BUN 06/03/2018 12  8 - 23 mg/dL Final  . Creatinine, Ser 06/03/2018 1.08  0.61 - 1.24 mg/dL Final  . Calcium 06/03/2018 9.5  8.9 - 10.3 mg/dL Final  . Total Protein 06/03/2018 7.5  6.5 - 8.1 g/dL Final  . Albumin 06/03/2018 3.8  3.5 - 5.0 g/dL Final  . AST 06/03/2018 25  15 - 41 U/L Final  . ALT 06/03/2018 24  0 - 44 U/L Final  . Alkaline Phosphatase 06/03/2018 78  38 - 126 U/L Final  . Total Bilirubin 06/03/2018 0.8  0.3 - 1.2 mg/dL Final  . GFR calc non Af Amer 06/03/2018 >60  >60 mL/min Final  . GFR calc Af Amer 06/03/2018 >60  >60 mL/min Final   Comment: (NOTE) The eGFR has been calculated using the CKD EPI equation. This calculation has not  been validated in all clinical situations. eGFR's persistently <60 mL/min signify possible Chronic Kidney Disease.   Georgiann Hahn gap 06/03/2018 8  5 - 15 Final   Performed at [facility], [address] [place], [place] [address]  . WBC 06/03/2018 8.3  4.0 - 10.5 K/uL Final  . RBC 06/03/2018 4.65  4.22 - 5.81 MIL/uL Final  . Hemoglobin 06/03/2018 13.1  13.0 - 17.0 g/dL Final  . HCT 06/03/2018 40.7  39.0 - 52.0 % Final  . MCV 06/03/2018 87.5  80.0 - 100.0 fL Final  . MCH 06/03/2018 28.2  26.0 - 34.0 pg Final  . MCHC 06/03/2018 32.2  30.0 - 36.0 g/dL Final  . RDW 06/03/2018 12.4  11.5 - 15.5 % Final  . Platelets 06/03/2018 313  150 - 400 K/uL Final  . nRBC 06/03/2018 0.0  0.0 - 0.2 % Final  . Neutrophils Relative % 06/03/2018 71  % Final  . Neutro Abs 06/03/2018 5.8  1.7 - 7.7 K/uL Final  . Lymphocytes Relative 06/03/2018 12  % Final  . Lymphs Abs 06/03/2018 1.0  0.7 - 4.0 K/uL Final  . Monocytes Relative 06/03/2018 8  % Final  . Monocytes Absolute 06/03/2018 0.6  0.1 - 1.0 K/uL Final  . Eosinophils Relative 06/03/2018 8  % Final  . Eosinophils Absolute 06/03/2018 0.7* 0.0 - 0.5 K/uL Final  . Basophils Relative 06/03/2018 1  % Final  . Basophils Absolute 06/03/2018 0.1  0.0 - 0.1 K/uL Final  . Immature Granulocytes 06/03/2018 0  % Final  . Abs Immature Granulocytes 06/03/2018 0.03  0.00 - 0.07 K/uL Final   Performed at [facility], [address] [place], [place] [address]    Assessment:  Jimmy West is a 67 y.o. male with a high-grade adenocarcinoma of the right lung s/p CT-guided biopsy of a RLL lung mass on 05/11/2018.  Pathology revealed an invasive high-grade adenocarcinoma, with predominantly solid growth pattern.  The neoplastic cells were TTF-1 (+), Napsin A (+), and P40 (+).  He has a T4 vertebral metastasis.  Clinical stage is T4N1M1.  There was not enough material for Foundation One testing.  PDL-1 revealed TPS  90%.  Chest CT angiogram on 04/27/2018  revealed no evidence of significant pulmonary embolus.  There was a 4.1 cm mass in the superior segment right lower lobe posteriorly causing obliteration of the in coming bronchus and mild postobstructive change. There were multiple additional bilateral pulmonary nodules (< 1 cm - 1.4 cm).   There were enlarged right hilar lymph nodes.  There was metastasis to the T4 vertebra with mild associated compression.  There was emphysematous changes in the lungs.   PET scan on 04/30/2018 revealed a 3.4 cm hypermetabolic RLL pulmonary mass (SUV 13.2), 11 mm pulmonary nodule in the LEFT lung (SUV 4.5), RIGHT paratracheal lymph node (SUV 5.1), and hypermetabolic activity within the T4 vertebral body (SUV 10.2).   Thoracic spine MRI on 05/03/2018 revealed T4 metastasis with a 40% pathologic compression deformity and 5 mm of retropulsion of the vertebral body.  Retropulsion results in mild spinal canal stenosis and mild bilateral C4-5 foraminal stenosis.  There was paravertebral soft tissue thickening from mid T3 to mid T5, likely representing edema related to the pathologic compression deformity vs. possible extraosseous extension of the neoplasm.  There were no additional thoracic spinal metastases noted.   Head MRI on 05/03/2018 revealed no intracranial metastatic disease.  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  Incidental mention made of mild paranasal sinus disease.  He is day 11 s/p cycle #1 pembrolizumab (05/24/2018).  Symptomatically, he is doing well.  He notes shortness of breath.  Back pain has improved.  Exam is stable.  Plan: 1. Labs today:  CBC with diff, CMP. 2. High-grade adenocarcinoma of the RIGHT lung: Day 11 s/p cycle #1 pembrolizumab. No side effects associated with therapy. Review plan for treatment every 3  weeks. 3. Osseous metastases:  Discuss T4 metastasis.  Discuss consideration of radiation and/or kyphoplasty.  Patient defers at this time.  Begin monthly Xgeva today.  Side effects reviewed. 4. Pain and symptom management: Pain well controlled. Patient using less pain medications. 5. RTC on 06/14/2018 for MD assessment, labs (CBC with diff, CMP, Mg, TSH), and cycle #2 pembrolizumab.   Lequita Asal, MD  06/03/2018, 9:17 AM

## 2018-06-03 NOTE — Progress Notes (Signed)
Pt in for follow up, reports having an occasional cough but tries hard to suppress because of the pain to lung and spine.  Pt also reports being more short of breath on minimal exertion.  Appetite is fair.

## 2018-06-04 LAB — T4: T4, Total: 10.1 ug/dL (ref 4.5–12.0)

## 2018-06-14 ENCOUNTER — Other Ambulatory Visit: Payer: Self-pay

## 2018-06-14 ENCOUNTER — Inpatient Hospital Stay: Payer: Medicare Other

## 2018-06-14 ENCOUNTER — Ambulatory Visit
Admission: RE | Admit: 2018-06-14 | Discharge: 2018-06-14 | Disposition: A | Discharge status: Home / Self Care | Payer: Medicare Other | Source: Ambulatory Visit | Attending: Hematology and Oncology | Admitting: Hematology and Oncology

## 2018-06-14 ENCOUNTER — Inpatient Hospital Stay (HOSPITAL_BASED_OUTPATIENT_CLINIC_OR_DEPARTMENT_OTHER): Payer: Medicare Other | Admitting: Hematology and Oncology

## 2018-06-14 ENCOUNTER — Ambulatory Visit
Admission: RE | Admit: 2018-06-14 | Discharge: 2018-06-14 | Disposition: A | Discharge status: Home / Self Care | Payer: Medicare Other | Source: Ambulatory Visit | Attending: Urgent Care | Admitting: Urgent Care

## 2018-06-14 ENCOUNTER — Encounter: Payer: Self-pay | Admitting: Hematology and Oncology

## 2018-06-14 VITALS — BP 126/81 | HR 71 | Temp 98.3°F | Resp 16 | Ht 67.0 in | Wt 163.4 lb

## 2018-06-14 DIAGNOSIS — R590 Localized enlarged lymph nodes: Secondary | ICD-10-CM | POA: Insufficient documentation

## 2018-06-14 DIAGNOSIS — J90 Pleural effusion, not elsewhere classified: Secondary | ICD-10-CM | POA: Insufficient documentation

## 2018-06-14 DIAGNOSIS — J439 Emphysema, unspecified: Secondary | ICD-10-CM | POA: Diagnosis not present

## 2018-06-14 DIAGNOSIS — C7802 Secondary malignant neoplasm of left lung: Secondary | ICD-10-CM | POA: Insufficient documentation

## 2018-06-14 DIAGNOSIS — C7951 Secondary malignant neoplasm of bone: Secondary | ICD-10-CM | POA: Diagnosis not present

## 2018-06-14 DIAGNOSIS — C7801 Secondary malignant neoplasm of right lung: Secondary | ICD-10-CM

## 2018-06-14 DIAGNOSIS — C3431 Malignant neoplasm of lower lobe, right bronchus or lung: Secondary | ICD-10-CM

## 2018-06-14 DIAGNOSIS — R918 Other nonspecific abnormal finding of lung field: Secondary | ICD-10-CM | POA: Insufficient documentation

## 2018-06-14 DIAGNOSIS — G893 Neoplasm related pain (acute) (chronic): Secondary | ICD-10-CM | POA: Diagnosis not present

## 2018-06-14 DIAGNOSIS — Z87891 Personal history of nicotine dependence: Secondary | ICD-10-CM

## 2018-06-14 DIAGNOSIS — Z5112 Encounter for antineoplastic immunotherapy: Secondary | ICD-10-CM

## 2018-06-14 DIAGNOSIS — R0602 Shortness of breath: Secondary | ICD-10-CM | POA: Insufficient documentation

## 2018-06-14 DIAGNOSIS — Z79899 Other long term (current) drug therapy: Secondary | ICD-10-CM | POA: Diagnosis not present

## 2018-06-14 DIAGNOSIS — Z7982 Long term (current) use of aspirin: Secondary | ICD-10-CM | POA: Diagnosis not present

## 2018-06-14 DIAGNOSIS — I700 Atherosclerosis of aorta: Secondary | ICD-10-CM | POA: Diagnosis not present

## 2018-06-14 DIAGNOSIS — Z5111 Encounter for antineoplastic chemotherapy: Secondary | ICD-10-CM | POA: Diagnosis not present

## 2018-06-14 DIAGNOSIS — R0600 Dyspnea, unspecified: Secondary | ICD-10-CM | POA: Diagnosis not present

## 2018-06-14 HISTORY — DX: Malignant (primary) neoplasm, unspecified: C80.1

## 2018-06-14 LAB — CBC WITH DIFFERENTIAL/PLATELET
Abs Immature Granulocytes: 0.01 10*3/uL (ref 0.00–0.07)
Basophils Absolute: 0.1 10*3/uL (ref 0.0–0.1)
Basophils Relative: 1 %
Eosinophils Absolute: 0.7 10*3/uL — ABNORMAL HIGH (ref 0.0–0.5)
Eosinophils Relative: 10 %
HCT: 38.2 % — ABNORMAL LOW (ref 39.0–52.0)
Hemoglobin: 12.3 g/dL — ABNORMAL LOW (ref 13.0–17.0)
Immature Granulocytes: 0 %
Lymphocytes Relative: 18 %
Lymphs Abs: 1.2 10*3/uL (ref 0.7–4.0)
MCH: 28.2 pg (ref 26.0–34.0)
MCHC: 32.2 g/dL (ref 30.0–36.0)
MCV: 87.6 fL (ref 80.0–100.0)
Monocytes Absolute: 0.7 10*3/uL (ref 0.1–1.0)
Monocytes Relative: 10 %
Neutro Abs: 4.2 10*3/uL (ref 1.7–7.7)
Neutrophils Relative %: 61 %
Platelets: 219 10*3/uL (ref 150–400)
RBC: 4.36 MIL/uL (ref 4.22–5.81)
RDW: 13.1 % (ref 11.5–15.5)
WBC: 6.9 10*3/uL (ref 4.0–10.5)
nRBC: 0 % (ref 0.0–0.2)

## 2018-06-14 LAB — COMPREHENSIVE METABOLIC PANEL
ALT: 18 U/L (ref 0–44)
AST: 22 U/L (ref 15–41)
Albumin: 3.7 g/dL (ref 3.5–5.0)
Alkaline Phosphatase: 94 U/L (ref 38–126)
Anion gap: 8 (ref 5–15)
BUN: 11 mg/dL (ref 8–23)
CO2: 23 mmol/L (ref 22–32)
Calcium: 8.4 mg/dL — ABNORMAL LOW (ref 8.9–10.3)
Chloride: 106 mmol/L (ref 98–111)
Creatinine, Ser: 0.86 mg/dL (ref 0.61–1.24)
GFR calc Af Amer: >60 mL/min (ref >60)
GFR calc non Af Amer: >60 mL/min (ref >60)
Glucose, Bld: 116 mg/dL — ABNORMAL HIGH (ref 70–99)
Potassium: 4.1 mmol/L (ref 3.5–5.1)
Sodium: 137 mmol/L (ref 135–145)
Total Bilirubin: 0.8 mg/dL (ref 0.3–1.2)
Total Protein: 6.8 g/dL (ref 6.5–8.1)

## 2018-06-14 LAB — MAGNESIUM: Magnesium: 2.2 mg/dL (ref 1.7–2.4)

## 2018-06-14 LAB — TSH: TSH: 2.183 u[IU]/mL (ref 0.350–4.500)

## 2018-06-14 MED ORDER — SODIUM CHLORIDE 0.9 % IV SOLN
Freq: Once | INTRAVENOUS | Status: AC
  Administered 2018-06-14: 12:00:00 via INTRAVENOUS
  Filled 2018-06-14: qty 250

## 2018-06-14 MED ORDER — SODIUM CHLORIDE 0.9 % IV SOLN
200.0000 mg | Freq: Once | INTRAVENOUS | Status: AC
  Administered 2018-06-14: 200 mg via INTRAVENOUS
  Filled 2018-06-14: qty 8

## 2018-06-14 MED ORDER — IOHEXOL 350 MG/ML SOLN
75.0000 mL | Freq: Once | INTRAVENOUS | Status: AC | PRN
  Administered 2018-06-14: 75 mL via INTRAVENOUS

## 2018-06-14 MED ORDER — HEPARIN SOD (PORK) LOCK FLUSH 100 UNIT/ML IV SOLN
500.0000 [IU] | Freq: Once | INTRAVENOUS | Status: AC
  Administered 2018-06-14: 500 [IU] via INTRAVENOUS

## 2018-06-14 NOTE — Progress Notes (Signed)
See follow up. New onset of dyspnea with excertion.

## 2018-06-14 NOTE — Progress Notes (Signed)
[facility] day:  06/14/2018  Chief Complaint: Jimmy West is a 67 y.o. male with metastatic adenocarcinoma of the lung who is seen for assessment prior to cycle #2 pembrolizumab.  HPI:  The patient was last seen in the medical oncology clinic on 06/03/2018.  At that time, he was doing well.  He noted shortness of breath.  Back pain had improved.  He was using less pain medication.  Exam was stable.  He received Xgeva.  During the interim, patient is doing well overall. He notes that he has been a little more fatigued as of late. Exertional shortness of breath persists with minimal activity. He denies any pleuritic chest pain, significant cough, or episodes of hemoptysis. Patient denies that he has experienced any B symptoms. He denies any interval infections.   Constipation persists, however it has improved with the use of the recommended Sen[place]. He is using this intervention every other day at this point.   Patient advises that he maintains an adequate appetite. He is eating well. Weight today is 163 lb 6.4 oz (74.1 kg), which compared to his last visit to the clinic, represents a stable weight.   Patient complains of pain rated 4/10 in the clinic today.   Past Medical History:  Diagnosis Date  . Bulging lumbar disc   . Heart attack [facility])     Past Surgical History:  Procedure Laterality Date  . CARDIAC CATHETERIZATION     two stents  . KNEE SURGERY Left   . PORTA CATH INSERTION N/A 05/21/2018   Procedure: PORTA CATH INSERTION;  Surgeon: Algernon Huxley, MD;  Location: [facility];  Service: Cardiovascular;  Laterality: N/A;    Family History  Problem Relation Age of Onset  . Heart failure Father   . Cancer Maternal Aunt   . Heart failure Maternal Uncle   . Dementia Paternal Grandmother   . Cancer Maternal Aunt     Social History:  reports that he quit smoking about 14 years ago. His smoking use included cigarettes.  He has a 22.50 pack-year smoking history. He has never used smokeless tobacco. He reports that he drinks about 15.0 standard drinks of alcohol per week. He reports that he does not use drugs. He started smoking at age 89.   He is smoking 1 1/2 packs/day.  Patient is employed as as full time Probation officer. Patient denies known exposures to radiation on toxins. The patient is accompanied by a woman today.  Allergies: No Known Allergies  Current Medications: Current Outpatient Medications  Medication Sig Dispense Refill  . acetaminophen (TYLENOL) 500 MG tablet Take 500 mg by mouth 3 (three) times daily as needed.    Marland Kitchen aspirin EC 81 MG tablet Take 81 mg by mouth daily.    Marland Kitchen dexamethasone (DECADRON) 4 MG tablet Take 1 tab two times a day the day before Alimta chemo, then take 2 tabs once a day for 3 days starting the day after chemo. 30 tablet 1  . docusate sodium (COLACE) 100 MG capsule Take 1 capsule (100 mg total) by mouth daily as needed. 30 capsule 2  . folic acid (FOLVITE) 1 MG tablet Take 1 tablet (1 mg total) by mouth daily. 90 tablet 1  . ibuprofen (ADVIL,MOTRIN) 200 MG tablet Take 200 mg by mouth every 6 (six) hours as needed.    . lidocaine-prilocaine (EMLA) cream Apply to affected area once 30 g 3  . ondansetron (ZOFRAN) 8  MG tablet Take 1 tablet (8 mg total) by mouth 2 (two) times daily as needed (Nausea or vomiting). Start if needed on the third day after chemotherapy. 30 tablet 1  . Oxycodone HCl 10 MG TABS Take 1 tablet (10 mg total) by mouth every 6 (six) hours as needed. 60 tablet 0  . polyethylene glycol (MIRALAX / GLYCOLAX) packet Take 17 g by mouth daily.     No current facility-administered medications for this visit.     Review of Systems  Constitutional: Positive for malaise/fatigue (slight). Negative for chills, diaphoresis, fever and weight loss.  HENT: Negative.  Negative for congestion, ear discharge, ear pain, nosebleeds, sinus pain, sore throat and tinnitus.   Eyes:  Negative.  Negative for blurred vision, double vision, photophobia, pain, discharge and redness.  Respiratory: Positive for cough (slight) and shortness of breath (gradual over time). Negative for hemoptysis, sputum production and wheezing.   Cardiovascular: Negative.  Negative for chest pain, palpitations, orthopnea, leg swelling and PND.  Gastrointestinal: Negative.  Negative for abdominal pain, blood in stool, constipation, diarrhea, melena and nausea.  Genitourinary: Negative.  Negative for dysuria, frequency, hematuria and urgency.  Musculoskeletal: Positive for back pain (T4 bone metastasis). Negative for falls, joint pain, myalgias and neck pain.       Back pain.  Skin: Negative.  Negative for itching and rash.  Neurological: Positive for sensory change (chronic numbness to LEFT heel 2/2 spinal disk issues). Negative for dizziness, tingling, tremors, focal weakness, weakness and headaches.  Endo/Heme/Allergies: Negative.  Does not bruise/bleed easily.  Psychiatric/Behavioral: Negative for depression and memory loss. The patient is not nervous/anxious and does not have insomnia.   All other systems reviewed and are negative.  Performance status (ECOG): 1  Vital Signs BP 126/81 (BP Location: Left Arm, Patient Position: Sitting)   Pulse 71   Temp 98.3 F (36.8 C) (Tympanic)   Resp 16   Ht '5\' 7"'$  (1.702 m)   Wt 163 lb 6.4 oz (74.1 kg)   SpO2 94%   BMI 25.59 kg/m   Physical Exam  Constitutional: He is oriented to person, place, and time and well-developed, well-nourished, and in no distress. No distress.  HENT:  Head: Normocephalic and atraumatic.  Mouth/Throat: Oropharynx is clear and moist and mucous membranes are normal. He has dentures. No oropharyngeal exudate.  Gray hair.  Mustache.  Eyes: Pupils are equal, round, and reactive to light. Conjunctivae and EOM are normal. No scleral icterus.  Glasses.  Blue eyes.  Neck: Normal range of motion. Neck supple. No JVD present.   Cardiovascular: Normal rate, regular rhythm, normal heart sounds and intact distal pulses. Exam reveals no gallop and no friction rub.  No murmur heard. Pulmonary/Chest: Effort normal and breath sounds normal. No respiratory distress. He has no wheezes. He has no rales.  Abdominal: Soft. Bowel sounds are normal. He exhibits no distension and no mass. There is no tenderness. There is no rebound and no guarding.  Musculoskeletal: Normal range of motion.        General: No edema.     Thoracic back: He exhibits tenderness and pain.  Lymphadenopathy:    He has no cervical adenopathy.    He has no axillary adenopathy.       Right: No supraclavicular adenopathy present.       Left: No supraclavicular adenopathy present.  Neurological: He is alert and oriented to person, place, and time. Gait normal.  Skin: Skin is warm and dry. No rash noted. He is  not diaphoretic. No erythema.  Psychiatric: Mood, affect and judgment normal.  Nursing note and vitals reviewed.   Orders Only on 06/14/2018  Component Date Value Ref Range Status  . Magnesium 06/14/2018 2.2  1.7 - 2.4 mg/dL Final   Performed at [facility], [address] [place], [place] [address]  . Sodium 06/14/2018 137  135 - 145 mmol/L Final  . Potassium 06/14/2018 4.1  3.5 - 5.1 mmol/L Final  . Chloride 06/14/2018 106  98 - 111 mmol/L Final  . CO2 06/14/2018 23  22 - 32 mmol/L Final  . Glucose, Bld 06/14/2018 116* 70 - 99 mg/dL Final  . BUN 06/14/2018 11  8 - 23 mg/dL Final  . Creatinine, Ser 06/14/2018 0.86  0.61 - 1.24 mg/dL Final  . Calcium 06/14/2018 8.4* 8.9 - 10.3 mg/dL Final  . Total Protein 06/14/2018 6.8  6.5 - 8.1 g/dL Final  . Albumin 06/14/2018 3.7  3.5 - 5.0 g/dL Final  . AST 06/14/2018 22  15 - 41 U/L Final  . ALT 06/14/2018 18  0 - 44 U/L Final  . Alkaline Phosphatase 06/14/2018 94  38 - 126 U/L Final  . Total Bilirubin 06/14/2018 0.8  0.3 - 1.2 mg/dL Final  . GFR calc non Af Amer 06/14/2018 >60  >60 mL/min  Final  . GFR calc Af Amer 06/14/2018 >60  >60 mL/min Final   Comment: (NOTE) The eGFR has been calculated using the CKD EPI equation. This calculation has not been validated in all clinical situations. eGFR's persistently <60 mL/min signify possible Chronic Kidney Disease.   Georgiann Hahn gap 06/14/2018 8  5 - 15 Final   Performed at [facility], [address] [place], [place] [address]  . WBC 06/14/2018 6.9  4.0 - 10.5 K/uL Final  . RBC 06/14/2018 4.36  4.22 - 5.81 MIL/uL Final  . Hemoglobin 06/14/2018 12.3* 13.0 - 17.0 g/dL Final  . HCT 06/14/2018 38.2* 39.0 - 52.0 % Final  . MCV 06/14/2018 87.6  80.0 - 100.0 fL Final  . MCH 06/14/2018 28.2  26.0 - 34.0 pg Final  . MCHC 06/14/2018 32.2  30.0 - 36.0 g/dL Final  . RDW 06/14/2018 13.1  11.5 - 15.5 % Final  . Platelets 06/14/2018 219  150 - 400 K/uL Final  . nRBC 06/14/2018 0.0  0.0 - 0.2 % Final  . Neutrophils Relative % 06/14/2018 61  % Final  . Neutro Abs 06/14/2018 4.2  1.7 - 7.7 K/uL Final  . Lymphocytes Relative 06/14/2018 18  % Final  . Lymphs Abs 06/14/2018 1.2  0.7 - 4.0 K/uL Final  . Monocytes Relative 06/14/2018 10  % Final  . Monocytes Absolute 06/14/2018 0.7  0.1 - 1.0 K/uL Final  . Eosinophils Relative 06/14/2018 10  % Final  . Eosinophils Absolute 06/14/2018 0.7* 0.0 - 0.5 K/uL Final  . Basophils Relative 06/14/2018 1  % Final  . Basophils Absolute 06/14/2018 0.1  0.0 - 0.1 K/uL Final  . Immature Granulocytes 06/14/2018 0  % Final  . Abs Immature Granulocytes 06/14/2018 0.01  0.00 - 0.07 K/uL Final   Performed at [facility], [address] [place], [place] [address]    Assessment:  Sora Olivo is a 67 y.o. 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  Pathology revealed an invasive high-grade adenocarcinoma, with predominantly solid growth pattern.  The neoplastic cells were TTF-1 (+), Napsin A (+), and  P40 (+).  He has a  T4 vertebral metastasis.  Clinical stage is T4N1M1.  There was not enough material for Foundation One testing.  PDL-1 revealed TPS 90%.  Chest CT angiogram on 04/27/2018 revealed no evidence of significant pulmonary embolus.  There was a 4.1 cm mass in the superior segment right lower lobe posteriorly causing obliteration of the in coming bronchus and mild postobstructive change. There were multiple additional bilateral pulmonary nodules (< 1 cm - 1.4 cm).   There were enlarged right hilar lymph nodes.  There was metastasis to the T4 vertebra with mild associated compression.  There was emphysematous changes in the lungs.   PET scan on 04/30/2018 revealed a 3.4 cm hypermetabolic RLL pulmonary mass (SUV 13.2), 11 mm pulmonary nodule in the LEFT lung (SUV 4.5), RIGHT paratracheal lymph node (SUV 5.1), and hypermetabolic activity within the T4 vertebral body (SUV 10.2).   Thoracic spine MRI on 05/03/2018 revealed T4 metastasis with a 40% pathologic compression deformity and 5 mm of retropulsion of the vertebral body.  Retropulsion results in mild spinal canal stenosis and mild bilateral C4-5 foraminal stenosis.  There was paravertebral soft tissue thickening from mid T3 to mid T5, likely representing edema related to the pathologic compression deformity vs. possible extraosseous extension of the neoplasm.  There were no additional thoracic spinal metastases noted.   Head MRI on 05/03/2018 revealed no intracranial metastatic disease.  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  Incidental mention made of mild paranasal sinus disease.  He is day 22 s/p cycle #1 pembrolizumab (05/24/2018).  He is tolerating treatment well.  He receives Xgeva monthly (06/03/2018).  Symptomatically, he is doing well.  He notes gradual increasing shortness  of breath.  He has chronic back pain (improved overall), but took more pain medications on 06/10/2018.  Exam is stable.  Calcium is 8.4.  Plan: 1. Labs today:  CBC with diff, CMP, Mg, TSH. 2. High-grade adenocarcinoma of the RIGHT lung: CXR to assess increasing shortness of breath. Cycle #2 pembrolizumab. No side effects associated with therapy. 3. Osseous metastases:  Discuss T4 metastasis.   Re-discuss consideration of radiation and/or kyphoplasty.  Patient again defers.  Continue monthly Xgeva (due 07/01/2018).  Calcium slightly low today.  Discuss calcium and vitamin D supplementation. 4. Pain and symptom management: Pain well controlled. Patient using less pain medications. 5. RTC on 07/05/2018 for MD assessment, labs (CBC with diff, CMP, Mg, TSH), Xgeva, and cycle #3 pembrolizumab. 6. RTC on 07/26/2018 for MD assessment, labs (CBC with diff, CMP, Mg, TSH), and cycle #4 pembrolizumab.  Addendum:  CXR today revealed small right pleural effusion and right basilar airspace disease, likely due to atelectasis, are new since the comparison examinations.  there was right lower lobe mass and pulmonary nodules consistent with the patient's known history of lung cancer.  Chest CT angiogram revealed changes consistent with the known right lower lobe primary lung mass with multiple metastatic lesions throughout both lungs some of these demonstrated some increase in size when compared with the prior study.  There was associated mediastinal and hilar adenopathy is noted as well.  There was a new right pleural effusion.  There was no evidence of pulmonary emboli.  T4 metastatic lesion with significant decrease in vertebral body height when compared with the prior exam. There was some posterior buckling of the vertebral body  wall.    Patient referred to radiation oncology and orthopedic surgery.   Honor Loh, NP  06/14/2018, 10:04 AM   I saw and evaluated the patient, participating in the key  portions of the service and reviewing pertinent diagnostic studies and records.  I reviewed the nurse practitioner's note and agree with the findings and the plan.  The assessment and plan were discussed with the patient.  Multiple questions were asked by the patient and answered.   Nolon Stalls, MD 07/04/2018,11:41 AM

## 2018-06-15 ENCOUNTER — Other Ambulatory Visit: Payer: Self-pay | Admitting: *Deleted

## 2018-06-15 ENCOUNTER — Telehealth: Payer: Self-pay | Admitting: Hematology and Oncology

## 2018-06-15 MED ORDER — OXYCODONE HCL 10 MG PO TABS: 10.0000 mg | ORAL_TABLET | ORAL | 0 refills | Status: DC | PRN

## 2018-06-15 NOTE — Telephone Encounter (Signed)
Re:  CT results  CT angiogram was reviewed with Jimmy West.  No pulmonary embolism.  Few nodules slightly larger.  New small right sided effusion.  We discussed his T4 metastasis with compression fracture.  Previously we discussed possible kyphoplasty and referral to radiation oncology.  He had declined.  I discussed reconsideration given his recent back pain and decrease in vertebral body height.  He is now agreeable to consultation with radiation oncology and interventional radiology or orthopedics.  Lequita Asal, MD

## 2018-06-21 ENCOUNTER — Ambulatory Visit
Admission: RE | Admit: 2018-06-21 | Discharge: 2018-06-21 | Disposition: A | Discharge status: Home / Self Care | Payer: Medicare Other | Source: Ambulatory Visit | Attending: Radiation Oncology | Admitting: Radiation Oncology

## 2018-06-21 ENCOUNTER — Encounter: Payer: Self-pay | Admitting: Radiation Oncology

## 2018-06-21 ENCOUNTER — Other Ambulatory Visit: Payer: Self-pay

## 2018-06-21 VITALS — BP 144/88 | HR 76 | Resp 16 | Wt 163.6 lb

## 2018-06-21 DIAGNOSIS — C3431 Malignant neoplasm of lower lobe, right bronchus or lung: Secondary | ICD-10-CM | POA: Diagnosis not present

## 2018-06-21 DIAGNOSIS — Z79899 Other long term (current) drug therapy: Secondary | ICD-10-CM | POA: Diagnosis not present

## 2018-06-21 DIAGNOSIS — C7951 Secondary malignant neoplasm of bone: Secondary | ICD-10-CM

## 2018-06-21 DIAGNOSIS — M4854XA Collapsed vertebra, not elsewhere classified, thoracic region, initial encounter for fracture: Secondary | ICD-10-CM | POA: Diagnosis not present

## 2018-06-21 DIAGNOSIS — Z87891 Personal history of nicotine dependence: Secondary | ICD-10-CM | POA: Diagnosis not present

## 2018-06-21 DIAGNOSIS — Z809 Family history of malignant neoplasm, unspecified: Secondary | ICD-10-CM | POA: Insufficient documentation

## 2018-06-21 NOTE — Consult Note (Signed)
NEW PATIENT EVALUATION  Name: Jimmy West  MRN: [id number]  Date:   06/21/2018     DOB: 09-20-1950   This 67 y.o. 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  REFERRING PHYSICIAN: [name], NP  CHIEF COMPLAINT:  Chief Complaint  Patient presents with  . Cancer    Pt is here for initial consultation of spine mets.     DIAGNOSIS: The encounter diagnosis was Bone metastasis ([place]).   PREVIOUS INVESTIGATIONS:  CT scans reviewed Pathology reports reviewed Clinical notes reviewed  HPI: patient is a 67 year old male with known stage IV adenocarcinoma the lung currently undergoing pembrolizumab.he has been complaining of significant pain in his mid thoracic spine radiating out to subscapular region. He has CT evidence of destruction of the T4 vertebral body. Patient is having no focal neurologic deficits. He continues to work. He is currently on narcotic analgesics. He does have bilateral lung metastatic lesions according to recent CT scan progressing. He also has associated mediastinal and hilar adenopathy.he is ambulating well. I been asked to evaluate him for palliative radiation therapy to his thoracic spine.  PLANNED TREATMENT REGIMEN: palliative radiation therapy to thoracic spine  PAST MEDICAL HISTORY:  has a past medical history of Bulging lumbar disc, Cancer ([place]), and Heart attack ([place]).    PAST SURGICAL HISTORY:  Past Surgical History:  Procedure Laterality Date  . CARDIAC CATHETERIZATION     two stents  . KNEE SURGERY Left   . PORTA CATH INSERTION N/A 05/21/2018   Procedure: PORTA CATH INSERTION;  Surgeon: Algernon Huxley, MD;  Location: [facility];  Service: Cardiovascular;  Laterality: N/A;    FAMILY HISTORY: family history includes Cancer in his maternal aunt and maternal aunt; Dementia in his paternal grandmother; Heart failure in his father and maternal  uncle.  SOCIAL HISTORY:  reports that he quit smoking about 14 years ago. His smoking use included cigarettes. He has a 22.50 pack-year smoking history. He has never used smokeless tobacco. He reports that he drinks about 15.0 standard drinks of alcohol per week. He reports that he does not use drugs.  ALLERGIES: Patient has no known allergies.  MEDICATIONS:  Current Outpatient Medications  Medication Sig Dispense Refill  . acetaminophen (TYLENOL) 500 MG tablet Take 500 mg by mouth 3 (three) times daily as needed.    Marland Kitchen aspirin EC 81 MG tablet Take 81 mg by mouth daily.    Marland Kitchen dexamethasone (DECADRON) 4 MG tablet Take 1 tab two times a day the day before Alimta chemo, then take 2 tabs once a day for 3 days starting the day after chemo. 30 tablet 1  . docusate sodium (COLACE) 100 MG capsule Take 1 capsule (100 mg total) by mouth daily as needed. 30 capsule 2  . folic acid (FOLVITE) 1 MG tablet Take 1 tablet (1 mg total) by mouth daily. 90 tablet 1  . ibuprofen (ADVIL,MOTRIN) 200 MG tablet Take 200 mg by mouth every 6 (six) hours as needed.    . lidocaine-prilocaine (EMLA) cream Apply to affected area once 30 g 3  . ondansetron (ZOFRAN) 8 MG tablet Take 1 tablet (8 mg total) by mouth 2 (two) times daily as needed (Nausea or vomiting). Start if needed on the third day after chemotherapy. 30 tablet 1  . Oxycodone HCl 10 MG TABS Take 1 tablet (10 mg total) by mouth every 4 (four) hours as needed. 60 tablet  0  . polyethylene glycol (MIRALAX / GLYCOLAX) packet Take 17 g by mouth daily.     No current facility-administered medications for this encounter.     ECOG PERFORMANCE STATUS:  1 - Symptomatic but completely ambulatory  REVIEW OF SYSTEMS:  Patient denies any weight loss, fatigue, weakness, fever, chills or night sweats. Patient denies any loss of vision, blurred vision. Patient denies any ringing  of the ears or hearing loss. No irregular heartbeat. Patient denies heart murmur or history of  fainting. Patient denies any chest pain or pain radiating to her upper extremities. Patient denies any shortness of breath, difficulty breathing at night, cough or hemoptysis. Patient denies any swelling in the lower legs. Patient denies any nausea vomiting, vomiting of blood, or coffee ground material in the vomitus. Patient denies any stomach pain. Patient states has had normal bowel movements no significant constipation or diarrhea. Patient denies any dysuria, hematuria or significant nocturia. Patient denies any problems walking, swelling in the joints or loss of balance. Patient denies any skin changes, loss of hair or loss of weight. Patient denies any excessive worrying or anxiety or significant depression. Patient denies any problems with insomnia. Patient denies excessive thirst, polyuria, polydipsia. Patient denies any swollen glands, patient denies easy bruising or easy bleeding. Patient denies any recent infections, allergies or URI. Patient "s visual fields have not changed significantly in recent time.    PHYSICAL EXAM: BP (!) 144/88 (BP Location: Left Arm, Patient Position: Sitting)   Pulse 76   Resp 16   Wt 163 lb 9.3 oz (74.2 kg)   BMI 34.19 kg/m  Pain is elicited on mild palpation of his thoracic spine. Motor sensory and DTR levels are equal and symmetric in upper lower extremities.Well-developed well-nourished patient in NAD. HEENT reveals PERLA, EOMI, discs not visualized.  Oral cavity is clear. No oral mucosal lesions are identified. Neck is clear without evidence of cervical or supraclavicular adenopathy. Lungs are clear to A&P. Cardiac examination is essentially unremarkable with regular rate and rhythm without murmur rub or thrill. Abdomen is benign with no organomegaly or masses noted. Motor sensory and DTR levels are equal and symmetric in the upper and lower extremities. Cranial nerves II through XII are grossly intact. Proprioception is intact. No peripheral adenopathy or edema  is identified. No motor or sensory levels are noted. Crude visual fields are within normal range.  LABORATORY DATA: pathology reports reviewed    RADIOLOGY RESULTS:CT scans reviewed compatible above-stated findings   IMPRESSION: stage IV adenocarcinoma the lung with destruction of the T4 vertebral body in 67 year old male  PLAN: this time I to go ahead with palliative radiation therapy to his thoracic spine. Would plan on delivering 3000 cGy in 10 fractions. Risks and benefits of treatment including possible dysphasia from radiation esophagitis skin reaction fatigue alteration of blood counts all were discussed in detail. I've personally set up and ordered CT simulation for later this week.There will be extra effort by both professional staff as well as technical staff to coordinate and manage concurrent chemoradiation and ensuing side effects during his treatments.  Patient wife both seem to comprehend my treatment plan well.  I would like to take this opportunity to thank you for allowing me to participate in the care of your patient.Noreene Filbert, MD

## 2018-06-22 ENCOUNTER — Ambulatory Visit: Payer: Medicare Other

## 2018-06-23 ENCOUNTER — Inpatient Hospital Stay: Payer: Medicare Other | Attending: Oncology | Admitting: Oncology

## 2018-06-23 ENCOUNTER — Ambulatory Visit
Admission: RE | Admit: 2018-06-23 | Discharge: 2018-06-23 | Disposition: A | Discharge status: Home / Self Care | Payer: Medicare Other | Source: Ambulatory Visit | Attending: Radiation Oncology | Admitting: Radiation Oncology

## 2018-06-23 ENCOUNTER — Inpatient Hospital Stay: Payer: Medicare Other

## 2018-06-23 VITALS — BP 149/101 | HR 91 | Temp 98.9°F | Resp 20

## 2018-06-23 DIAGNOSIS — Z923 Personal history of irradiation: Secondary | ICD-10-CM | POA: Insufficient documentation

## 2018-06-23 DIAGNOSIS — Z95828 Presence of other vascular implants and grafts: Secondary | ICD-10-CM

## 2018-06-23 DIAGNOSIS — Z87891 Personal history of nicotine dependence: Secondary | ICD-10-CM | POA: Diagnosis not present

## 2018-06-23 DIAGNOSIS — C3431 Malignant neoplasm of lower lobe, right bronchus or lung: Secondary | ICD-10-CM

## 2018-06-23 DIAGNOSIS — Z5111 Encounter for antineoplastic chemotherapy: Secondary | ICD-10-CM | POA: Diagnosis not present

## 2018-06-23 DIAGNOSIS — R531 Weakness: Secondary | ICD-10-CM | POA: Diagnosis not present

## 2018-06-23 DIAGNOSIS — G893 Neoplasm related pain (acute) (chronic): Secondary | ICD-10-CM

## 2018-06-23 DIAGNOSIS — Z79899 Other long term (current) drug therapy: Secondary | ICD-10-CM | POA: Diagnosis not present

## 2018-06-23 DIAGNOSIS — Z510 Encounter for antineoplastic radiation therapy: Secondary | ICD-10-CM | POA: Diagnosis not present

## 2018-06-23 DIAGNOSIS — C7951 Secondary malignant neoplasm of bone: Secondary | ICD-10-CM

## 2018-06-23 DIAGNOSIS — M542 Cervicalgia: Secondary | ICD-10-CM

## 2018-06-23 DIAGNOSIS — R5383 Other fatigue: Secondary | ICD-10-CM | POA: Insufficient documentation

## 2018-06-23 MED ORDER — HEPARIN SOD (PORK) LOCK FLUSH 100 UNIT/ML IV SOLN
500.0000 [IU] | Freq: Once | INTRAVENOUS | Status: AC
  Administered 2018-06-23: 500 [IU] via INTRAVENOUS
  Filled 2018-06-23: qty 5

## 2018-06-23 MED ORDER — MORPHINE SULFATE 4 MG/ML IJ SOLN: 2.0000 mg | Freq: Once | INTRAMUSCULAR | Status: DC

## 2018-06-23 MED ORDER — CYCLOBENZAPRINE HCL 10 MG PO TABS: 10.0000 mg | ORAL_TABLET | Freq: Three times a day (TID) | ORAL | 0 refills | Status: DC | PRN

## 2018-06-23 MED ORDER — HEPARIN SOD (PORK) LOCK FLUSH 100 UNIT/ML IV SOLN
INTRAVENOUS | Status: AC
  Filled 2018-06-23: qty 5

## 2018-06-23 MED ORDER — DEXAMETHASONE 4 MG PO TABS: 4.0000 mg | ORAL_TABLET | Freq: Three times a day (TID) | ORAL | 0 refills | Status: DC

## 2018-06-23 MED ORDER — MORPHINE SULFATE (PF) 2 MG/ML IV SOLN
2.0000 mg | Freq: Once | INTRAVENOUS | Status: AC
  Administered 2018-06-23: 2 mg via INTRAVENOUS
  Filled 2018-06-23: qty 1

## 2018-06-23 MED ORDER — DEXAMETHASONE SODIUM PHOSPHATE 10 MG/ML IJ SOLN
10.0000 mg | Freq: Once | INTRAMUSCULAR | Status: AC
  Administered 2018-06-23: 10 mg via INTRAVENOUS
  Filled 2018-06-23: qty 1

## 2018-06-23 MED ORDER — SODIUM CHLORIDE 0.9 % IV SOLN: 10.0000 mg | Freq: Once | INTRAVENOUS | Status: DC

## 2018-06-23 MED ORDER — SODIUM CHLORIDE 0.9% FLUSH
10.0000 mL | INTRAVENOUS | Status: DC | PRN
  Administered 2018-06-23: 10 mL via INTRAVENOUS
  Filled 2018-06-23: qty 10

## 2018-06-23 NOTE — Progress Notes (Signed)
Symptom Management Consult note [facility]  [phone] [phone]  Patient Care Team: [name], NP as PCP - General (Nurse Practitioner) Telford Nab, RN as Registered Nurse Burlene Arnt Gerda Diss, MD as Attending Physician (Emergency Medicine)   Name of the patient: Jimmy West  [id number]  May 01, 1951   Date of visit: 06/23/2018  Diagnosis: Metastatic adenocarcinoma of the lung  Chief Complaint: left sided neck pain  Current Treatment: s/p cycle 2 Keytruda.  Last received on 06/14/2018.  Oncology History: Patient was last seen by primary oncologist Dr. Mike Gip on 06/14/2018 where overall he was doing well.  He continued to have exertional shortness of breath with activity but denied any chest pain, cough or hemoptysis.  He complained of constipation that was improving with the use of Senokot.  He had recently completed cycle 1 Keytruda without issue.  Plan was to consult with radiation oncology for possible radiation to T4 metastasis.  Currently on monthly Xgeva.   In the interim, he met with Dr. Donella Stade in radiation oncology on 06/21/2018 given significant worsening pain in mid thoracic spine radiating out to subscapular region.  Plan was to deliver 3000 CG Y with 10 fractions.  CT simulation scheduled for 06/23/2018 (today). Began radiation on 06/30/2018.    Primary cancer of right lower lobe of lung ([place])   04/26/2018 Initial Diagnosis    Primary cancer of right lower lobe of lung ([place])    05/17/2018 -  Chemotherapy    The patient had pembrolizumab (KEYTRUDA) 200 mg in sodium chloride 0.9 % 50 mL chemo infusion, 200 mg, Intravenous, Once, 2 of 6 cycles Administration: 200 mg (05/24/2018), 200 mg (06/14/2018)  for chemotherapy treatment.      Subjective Data:  ECOG: 1 - Symptomatic but completely ambulatory  Subjective:     Jimmy West is a 67 y.o. male who presents for evaluation of neck pain. Event that  precipitated these symptoms: none known. Onset of symptoms was 1 day ago, and have been rapidly worsening since that time. Current symptoms are pain in left sided c-spine (aching, shooting and throbbing in character; 10/10 in severity) and stiffness in left cspine. Unable to turn or move head.. Patient denies numbness in left/right arms or fingers. Patient has had no prior neck problems and known T3-T4 metastaic leison. Previous treatments: medication: NSAID: ibuprofen and tylenol, oxycodone and .  The following portions of the patient's history were reviewed and updated as appropriate: allergies, current medications, past family history, past medical history, past social history, past surgical history and problem list.  Review of Systems A comprehensive review of systems was negative except for: Musculoskeletal: positive for back pain, muscle weakness, neck pain and stiff joints    Objective:    There were no vitals taken for this visit. General:   alert and severe distress  External Deformity:  absent  ROM Cervical Spine:  limited flexion, extension, right rotation, left rotation, right lateral flexion and left lateral flexion  Midline Tenderness:  moderate on the left  Paraspinous tenderness:  moderate on the left  UE Neurologic Exam:  unremarkable   X-ray of the cervical spine: scheduled for 06/24/18. Had CT simulation with Dr. Baruch Gouty with no obvious deformity    Assessment:    Cervical pain d/t unkown etiology. Likely musculoskeltatl but cannot rule out fracture until imaging.     Plan:    NSAIDs per medication orders. OTC analgesics as needed. Muscle relaxants started per medication orders. Plain film x-rays.  CT simulation today with Dr. Baruch Gouty    High-grade adenocarcinoma of the right lung: S/p cycle 2 Keytruda.  Last given on 06/14/2018.  Tolerating well.  Scheduled to return to clinic on 07/05/2018 for MD assessment, labs and cycle 3 Keytruda.  T4 metastasis: Recently met  with Dr. Donella Stade.  Scheduled to have CT simulation today and begin treatment next week.   Acute left-sided cervical pain: 10/10 pain that began yesterday.  He denies injury but states "I may have slept on it wrong".  Has tried heat, rest and pain medication.  Was unable to sleep last night d/t increasing stiffness and throbbing shooting pain.  Since 3 AM, he has had four 10 mg oxycodone without relief. Patient is very emotional with new developing symptom.  Plan: Give 4 mg morphine prior to CT simulation so he can tolerate. Consulted Dr. Donella Stade and he is aware of new cervical pain.  Stat cervical x-ray to rule out fracture.   Follow-up: Spoke with Dr. Donella Stade after CT simulation and he was unable to identify any acute abnormality in the cervical spine.  Recommends starting him on 4 mg Decadron TID and beginning radiation ASAP.  Patient will begin radiation starting tomorrow instead of next week.  Patient feeling much better after 4 mg morphine.   Gave 10 mg dexamethasone in clinic today prior to discharge.  Start oral steroids tomorrow.  Patient understands. He will complete x-ray of cervical spine either today or tomorrow morning prior to radiation.  Likely musculoskeletal in origin but given history of T4 leison with significant decrease in vertebral body with buckling will get imaging to confirm.  Denies radiculopathy including numbness or tingling down left arm.  Rx Flexeril 5 mg TID by mouth PRN for muscle spasms.   Greater than 50% was spent in counseling and coordination of care with this patient including but not limited to discussion of the relevant topics above (See A&P) including, but not limited to diagnosis and management of acute and chronic medical conditions.   Faythe Casa, NP 06/24/2018 9:42 AM

## 2018-06-24 ENCOUNTER — Ambulatory Visit
Admission: RE | Admit: 2018-06-24 | Discharge: 2018-06-24 | Disposition: A | Discharge status: Home / Self Care | Payer: Medicare Other | Source: Ambulatory Visit | Attending: Oncology | Admitting: Oncology

## 2018-06-24 ENCOUNTER — Inpatient Hospital Stay: Payer: Medicare Other

## 2018-06-24 ENCOUNTER — Ambulatory Visit
Admission: RE | Admit: 2018-06-24 | Discharge: 2018-06-24 | Disposition: A | Discharge status: Home / Self Care | Payer: Medicare Other | Source: Ambulatory Visit | Attending: Radiation Oncology | Admitting: Radiation Oncology

## 2018-06-24 ENCOUNTER — Other Ambulatory Visit: Payer: Self-pay | Admitting: *Deleted

## 2018-06-24 VITALS — BP 111/76 | HR 87 | Temp 97.1°F | Resp 18

## 2018-06-24 DIAGNOSIS — C7951 Secondary malignant neoplasm of bone: Secondary | ICD-10-CM | POA: Insufficient documentation

## 2018-06-24 DIAGNOSIS — Z95828 Presence of other vascular implants and grafts: Secondary | ICD-10-CM

## 2018-06-24 DIAGNOSIS — G893 Neoplasm related pain (acute) (chronic): Secondary | ICD-10-CM | POA: Diagnosis not present

## 2018-06-24 DIAGNOSIS — M4802 Spinal stenosis, cervical region: Secondary | ICD-10-CM | POA: Diagnosis not present

## 2018-06-24 DIAGNOSIS — M50321 Other cervical disc degeneration at C4-C5 level: Secondary | ICD-10-CM | POA: Insufficient documentation

## 2018-06-24 DIAGNOSIS — M542 Cervicalgia: Secondary | ICD-10-CM | POA: Diagnosis not present

## 2018-06-24 DIAGNOSIS — R5383 Other fatigue: Secondary | ICD-10-CM | POA: Diagnosis not present

## 2018-06-24 DIAGNOSIS — Z510 Encounter for antineoplastic radiation therapy: Secondary | ICD-10-CM | POA: Diagnosis not present

## 2018-06-24 DIAGNOSIS — Z87891 Personal history of nicotine dependence: Secondary | ICD-10-CM | POA: Diagnosis not present

## 2018-06-24 DIAGNOSIS — C3431 Malignant neoplasm of lower lobe, right bronchus or lung: Secondary | ICD-10-CM | POA: Diagnosis not present

## 2018-06-24 DIAGNOSIS — M50322 Other cervical disc degeneration at C5-C6 level: Secondary | ICD-10-CM | POA: Diagnosis not present

## 2018-06-24 DIAGNOSIS — M50323 Other cervical disc degeneration at C6-C7 level: Secondary | ICD-10-CM | POA: Diagnosis not present

## 2018-06-24 DIAGNOSIS — R531 Weakness: Secondary | ICD-10-CM | POA: Diagnosis not present

## 2018-06-24 DIAGNOSIS — Z5111 Encounter for antineoplastic chemotherapy: Secondary | ICD-10-CM | POA: Diagnosis not present

## 2018-06-24 MED ORDER — HEPARIN SOD (PORK) LOCK FLUSH 100 UNIT/ML IV SOLN
500.0000 [IU] | Freq: Once | INTRAVENOUS | Status: AC
  Administered 2018-06-24: 500 [IU] via INTRAVENOUS
  Filled 2018-06-24: qty 5

## 2018-06-24 MED ORDER — MORPHINE SULFATE 2 MG/ML IJ SOLN
4.0000 mg | Freq: Once | INTRAMUSCULAR | Status: AC
  Administered 2018-06-24: 4 mg via INTRAVENOUS
  Filled 2018-06-24: qty 2

## 2018-06-24 MED ORDER — SODIUM CHLORIDE 0.9% FLUSH
10.0000 mL | INTRAVENOUS | Status: DC | PRN
  Administered 2018-06-24: 10 mL via INTRAVENOUS
  Filled 2018-06-24: qty 10

## 2018-06-24 MED ORDER — DEXAMETHASONE SODIUM PHOSPHATE 10 MG/ML IJ SOLN
10.0000 mg | Freq: Once | INTRAMUSCULAR | Status: AC
  Administered 2018-06-24: 10 mg via INTRAVENOUS
  Filled 2018-06-24: qty 1

## 2018-06-25 ENCOUNTER — Ambulatory Visit
Admission: RE | Admit: 2018-06-25 | Discharge: 2018-06-25 | Disposition: A | Discharge status: Home / Self Care | Payer: Medicare Other | Source: Ambulatory Visit | Attending: Radiation Oncology | Admitting: Radiation Oncology

## 2018-06-25 ENCOUNTER — Other Ambulatory Visit: Payer: Self-pay | Admitting: *Deleted

## 2018-06-25 DIAGNOSIS — Z87891 Personal history of nicotine dependence: Secondary | ICD-10-CM | POA: Diagnosis not present

## 2018-06-25 DIAGNOSIS — Z510 Encounter for antineoplastic radiation therapy: Secondary | ICD-10-CM | POA: Diagnosis not present

## 2018-06-25 DIAGNOSIS — C3431 Malignant neoplasm of lower lobe, right bronchus or lung: Secondary | ICD-10-CM | POA: Diagnosis not present

## 2018-06-25 DIAGNOSIS — C7951 Secondary malignant neoplasm of bone: Secondary | ICD-10-CM | POA: Diagnosis not present

## 2018-06-25 MED ORDER — OXYCODONE HCL 10 MG PO TABS: 10.0000 mg | ORAL_TABLET | ORAL | 0 refills | Status: DC | PRN

## 2018-06-28 ENCOUNTER — Ambulatory Visit
Admission: RE | Admit: 2018-06-28 | Discharge: 2018-06-28 | Disposition: A | Discharge status: Home / Self Care | Payer: Medicare Other | Source: Ambulatory Visit | Attending: Radiation Oncology | Admitting: Radiation Oncology

## 2018-06-28 DIAGNOSIS — C3431 Malignant neoplasm of lower lobe, right bronchus or lung: Secondary | ICD-10-CM | POA: Diagnosis not present

## 2018-06-28 DIAGNOSIS — Z87891 Personal history of nicotine dependence: Secondary | ICD-10-CM | POA: Diagnosis not present

## 2018-06-28 DIAGNOSIS — C7951 Secondary malignant neoplasm of bone: Secondary | ICD-10-CM | POA: Diagnosis not present

## 2018-06-28 DIAGNOSIS — Z510 Encounter for antineoplastic radiation therapy: Secondary | ICD-10-CM | POA: Diagnosis not present

## 2018-06-29 ENCOUNTER — Ambulatory Visit
Admission: RE | Admit: 2018-06-29 | Discharge: 2018-06-29 | Disposition: A | Discharge status: Home / Self Care | Payer: Medicare Other | Source: Ambulatory Visit | Attending: Radiation Oncology | Admitting: Radiation Oncology

## 2018-06-29 DIAGNOSIS — C7951 Secondary malignant neoplasm of bone: Secondary | ICD-10-CM | POA: Diagnosis not present

## 2018-06-29 DIAGNOSIS — Z87891 Personal history of nicotine dependence: Secondary | ICD-10-CM | POA: Diagnosis not present

## 2018-06-29 DIAGNOSIS — C3431 Malignant neoplasm of lower lobe, right bronchus or lung: Secondary | ICD-10-CM | POA: Diagnosis not present

## 2018-06-29 DIAGNOSIS — Z510 Encounter for antineoplastic radiation therapy: Secondary | ICD-10-CM | POA: Diagnosis not present

## 2018-06-30 ENCOUNTER — Ambulatory Visit
Admission: RE | Admit: 2018-06-30 | Discharge: 2018-06-30 | Disposition: A | Discharge status: Home / Self Care | Payer: Medicare Other | Source: Ambulatory Visit | Attending: Radiation Oncology | Admitting: Radiation Oncology

## 2018-06-30 ENCOUNTER — Ambulatory Visit: Payer: Medicare Other

## 2018-06-30 ENCOUNTER — Other Ambulatory Visit: Payer: Self-pay | Admitting: *Deleted

## 2018-06-30 DIAGNOSIS — Z87891 Personal history of nicotine dependence: Secondary | ICD-10-CM | POA: Diagnosis not present

## 2018-06-30 DIAGNOSIS — Z510 Encounter for antineoplastic radiation therapy: Secondary | ICD-10-CM | POA: Diagnosis not present

## 2018-06-30 DIAGNOSIS — C7951 Secondary malignant neoplasm of bone: Secondary | ICD-10-CM | POA: Diagnosis not present

## 2018-06-30 DIAGNOSIS — C3431 Malignant neoplasm of lower lobe, right bronchus or lung: Secondary | ICD-10-CM | POA: Diagnosis not present

## 2018-06-30 MED ORDER — SUCRALFATE 1 G PO TABS: 1.0000 g | ORAL_TABLET | Freq: Three times a day (TID) | ORAL | 4 refills | Status: DC

## 2018-07-01 ENCOUNTER — Ambulatory Visit: Payer: Medicare Other

## 2018-07-01 ENCOUNTER — Ambulatory Visit
Admission: RE | Admit: 2018-07-01 | Discharge: 2018-07-01 | Disposition: A | Discharge status: Home / Self Care | Payer: Medicare Other | Source: Ambulatory Visit | Attending: Radiation Oncology | Admitting: Radiation Oncology

## 2018-07-01 DIAGNOSIS — Z510 Encounter for antineoplastic radiation therapy: Secondary | ICD-10-CM | POA: Diagnosis not present

## 2018-07-01 DIAGNOSIS — C3431 Malignant neoplasm of lower lobe, right bronchus or lung: Secondary | ICD-10-CM | POA: Diagnosis not present

## 2018-07-01 DIAGNOSIS — Z87891 Personal history of nicotine dependence: Secondary | ICD-10-CM | POA: Diagnosis not present

## 2018-07-01 DIAGNOSIS — C7951 Secondary malignant neoplasm of bone: Secondary | ICD-10-CM | POA: Diagnosis not present

## 2018-07-02 ENCOUNTER — Ambulatory Visit: Payer: Medicare Other

## 2018-07-02 ENCOUNTER — Ambulatory Visit
Admission: RE | Admit: 2018-07-02 | Discharge: 2018-07-02 | Disposition: A | Discharge status: Home / Self Care | Payer: Medicare Other | Source: Ambulatory Visit | Attending: Radiation Oncology | Admitting: Radiation Oncology

## 2018-07-02 DIAGNOSIS — Z510 Encounter for antineoplastic radiation therapy: Secondary | ICD-10-CM | POA: Diagnosis not present

## 2018-07-02 DIAGNOSIS — Z87891 Personal history of nicotine dependence: Secondary | ICD-10-CM | POA: Diagnosis not present

## 2018-07-02 DIAGNOSIS — C7951 Secondary malignant neoplasm of bone: Secondary | ICD-10-CM | POA: Diagnosis not present

## 2018-07-02 DIAGNOSIS — C3431 Malignant neoplasm of lower lobe, right bronchus or lung: Secondary | ICD-10-CM | POA: Diagnosis not present

## 2018-07-05 ENCOUNTER — Ambulatory Visit: Payer: Medicare Other

## 2018-07-05 ENCOUNTER — Ambulatory Visit
Admission: RE | Admit: 2018-07-05 | Discharge: 2018-07-05 | Disposition: A | Discharge status: Home / Self Care | Payer: Medicare Other | Source: Ambulatory Visit | Attending: Radiation Oncology | Admitting: Radiation Oncology

## 2018-07-05 ENCOUNTER — Encounter: Payer: Medicare Other | Admitting: Hospice and Palliative Medicine

## 2018-07-05 ENCOUNTER — Inpatient Hospital Stay (HOSPITAL_BASED_OUTPATIENT_CLINIC_OR_DEPARTMENT_OTHER): Payer: Medicare Other | Admitting: Hematology and Oncology

## 2018-07-05 ENCOUNTER — Inpatient Hospital Stay: Payer: Medicare Other

## 2018-07-05 ENCOUNTER — Encounter: Payer: Self-pay | Admitting: Hematology and Oncology

## 2018-07-05 VITALS — BP 128/83 | HR 78 | Temp 97.8°F | Resp 18 | Wt 160.3 lb

## 2018-07-05 DIAGNOSIS — Z79899 Other long term (current) drug therapy: Secondary | ICD-10-CM

## 2018-07-05 DIAGNOSIS — R531 Weakness: Secondary | ICD-10-CM | POA: Diagnosis not present

## 2018-07-05 DIAGNOSIS — Z87891 Personal history of nicotine dependence: Secondary | ICD-10-CM | POA: Diagnosis not present

## 2018-07-05 DIAGNOSIS — C7951 Secondary malignant neoplasm of bone: Secondary | ICD-10-CM

## 2018-07-05 DIAGNOSIS — R5383 Other fatigue: Secondary | ICD-10-CM | POA: Diagnosis not present

## 2018-07-05 DIAGNOSIS — G893 Neoplasm related pain (acute) (chronic): Secondary | ICD-10-CM

## 2018-07-05 DIAGNOSIS — C3431 Malignant neoplasm of lower lobe, right bronchus or lung: Secondary | ICD-10-CM | POA: Diagnosis not present

## 2018-07-05 DIAGNOSIS — Z510 Encounter for antineoplastic radiation therapy: Secondary | ICD-10-CM | POA: Diagnosis not present

## 2018-07-05 DIAGNOSIS — Z5112 Encounter for antineoplastic immunotherapy: Secondary | ICD-10-CM

## 2018-07-05 DIAGNOSIS — Z5111 Encounter for antineoplastic chemotherapy: Secondary | ICD-10-CM | POA: Diagnosis not present

## 2018-07-05 LAB — CBC WITH DIFFERENTIAL/PLATELET
Abs Immature Granulocytes: 0.08 10*3/uL — ABNORMAL HIGH (ref 0.00–0.07)
Basophils Absolute: 0 10*3/uL (ref 0.0–0.1)
Basophils Relative: 0 %
Eosinophils Absolute: 0 10*3/uL (ref 0.0–0.5)
Eosinophils Relative: 0 %
HCT: 44.6 % (ref 39.0–52.0)
Hemoglobin: 14.1 g/dL (ref 13.0–17.0)
Immature Granulocytes: 1 %
Lymphocytes Relative: 5 %
Lymphs Abs: 0.6 10*3/uL — ABNORMAL LOW (ref 0.7–4.0)
MCH: 27.6 pg (ref 26.0–34.0)
MCHC: 31.6 g/dL (ref 30.0–36.0)
MCV: 87.3 fL (ref 80.0–100.0)
Monocytes Absolute: 0.5 10*3/uL (ref 0.1–1.0)
Monocytes Relative: 5 %
Neutro Abs: 9.4 10*3/uL — ABNORMAL HIGH (ref 1.7–7.7)
Neutrophils Relative %: 89 %
Platelets: 200 10*3/uL (ref 150–400)
RBC: 5.11 MIL/uL (ref 4.22–5.81)
RDW: 13.6 % (ref 11.5–15.5)
WBC: 10.5 10*3/uL (ref 4.0–10.5)
nRBC: 0 % (ref 0.0–0.2)

## 2018-07-05 LAB — COMPREHENSIVE METABOLIC PANEL
ALT: 27 U/L (ref 0–44)
AST: 24 U/L (ref 15–41)
Albumin: 3.9 g/dL (ref 3.5–5.0)
Alkaline Phosphatase: 72 U/L (ref 38–126)
Anion gap: 11 (ref 5–15)
BUN: 17 mg/dL (ref 8–23)
CO2: 23 mmol/L (ref 22–32)
Calcium: 8.9 mg/dL (ref 8.9–10.3)
Chloride: 104 mmol/L (ref 98–111)
Creatinine, Ser: 0.84 mg/dL (ref 0.61–1.24)
GFR calc Af Amer: >60 mL/min (ref >60)
GFR calc non Af Amer: >60 mL/min (ref >60)
Glucose, Bld: 132 mg/dL — ABNORMAL HIGH (ref 70–99)
Potassium: 4.1 mmol/L (ref 3.5–5.1)
Sodium: 138 mmol/L (ref 135–145)
Total Bilirubin: 1.1 mg/dL (ref 0.3–1.2)
Total Protein: 6.9 g/dL (ref 6.5–8.1)

## 2018-07-05 LAB — TSH: TSH: 0.385 u[IU]/mL (ref 0.350–4.500)

## 2018-07-05 LAB — MAGNESIUM: Magnesium: 2.4 mg/dL (ref 1.7–2.4)

## 2018-07-05 MED ORDER — HEPARIN SOD (PORK) LOCK FLUSH 100 UNIT/ML IV SOLN
INTRAVENOUS | Status: AC
  Filled 2018-07-05: qty 5

## 2018-07-05 MED ORDER — DENOSUMAB 120 MG/1.7ML ~~LOC~~ SOLN
120.0000 mg | Freq: Once | SUBCUTANEOUS | Status: AC
  Administered 2018-07-05: 120 mg via SUBCUTANEOUS
  Filled 2018-07-05: qty 1.7

## 2018-07-05 NOTE — Progress Notes (Signed)
Pt in for follow up, reports was in for symptom management clinic last week and much improved from then.  Appetite still fair, down 3 lbs since last documented on 06/23/18.

## 2018-07-05 NOTE — Progress Notes (Signed)
[facility] day:  07/05/2018  Chief Complaint: Jimmy West is a 67 y.o. male with metastatic adenocarcinoma of the lung who is seen for assessment prior to cycle #3 pembrolizumab and monthly Xgeva.  HPI:  The patient was last seen in the medical oncology clinic on 06/14/2018.  At that time, he was doing well.  He noted gradual increasing shortness of breath.  He had chronic back pain (improved overall), but took more pain medications on 06/10/2018.  Exam was stable.  Calcium was 8.4.  He received cycle #2 pembrolizumab on 06/14/2018.  CXR on 06/14/2018 revealed small right pleural effusion and right basilar airspace disease, likely due to atelectasis, are new since the comparison examinations.  there was right lower lobe mass and pulmonary nodules consistent with the patient's known history of lung cancer.  Chest CT angiogram on 06/14/2018 revealed changes consistent with the known right lower lobe primary lung mass with multiple metastatic lesions throughout both lungs some of these demonstrated some increase in size when compared with the prior study.  There was associated mediastinal and hilar adenopathy is noted as well.  There was a new right pleural effusion.  There was no evidence of pulmonary emboli.  T4 metastatic lesion with significant decrease in vertebral body height when compared with the prior exam. There was some posterior buckling of the vertebral body wall.    He was contacted regarding the results.  He was encouraged to follow-up with radiation oncology and orthopedics. Orthopedics declined kyphoplasty.  He was seen by Dr. Baruch Gouty on 06/21/2018.  Plan was for 3000 cGy in 10 fractions. Patient is scheduled to complete radiation therapy on 07/07/2018.  He began radiation on 06/24/2018.  He has received 8 treatments to date (including today).  He saw Faythe Casa, NP on 06/23/2018 with LEFT sided neck pain. He received morphine,  dexamethasone, and cyclobenzaprine.  Cervical spine series revealed multi-level degenerative disc disease.  There was bilateral neural foraminal stenosis secondary to uncovertebral spurring.  During the interim, patient's pain in his neck has markedly improved with the treatment with steroids and SMR medications. Patient notes perceived benefit from palliative radiation course thus far. Patient has been on dexamethasone 4 mg BID since 06/25/2018. Patient denies that he has experienced any B symptoms. He denies any interval infections.   Patient notes that his breathing has also improved. He states, "when I go up the stairs, I do not give out of breath".   Patient advises that he maintains an adequate appetite. He is eating well. Weight today is 160 lb 5 oz (72.7 kg), which compared to his last visit to the clinic, represents a  3 pound decrease.   Patient complains of pain rated 3/10 in the clinic today.   Past Medical History:  Diagnosis Date  . Bulging lumbar disc   . Cancer ([place])   . Heart attack [facility])     Past Surgical History:  Procedure Laterality Date  . CARDIAC CATHETERIZATION     two stents  . KNEE SURGERY Left   . PORTA CATH INSERTION N/A 05/21/2018   Procedure: PORTA CATH INSERTION;  Surgeon: Algernon Huxley, MD;  Location: [facility];  Service: Cardiovascular;  Laterality: N/A;    Family History  Problem Relation Age of Onset  . Heart failure Father   . Cancer Maternal Aunt   . Heart failure Maternal Uncle   . Dementia Paternal Grandmother   . Cancer Maternal Aunt  Social History:  reports that he quit smoking about 14 years ago. His smoking use included cigarettes. He has a 22.50 pack-year smoking history. He has never used smokeless tobacco. He reports current alcohol use of about 15.0 standard drinks of alcohol per week. He reports that he does not use drugs. He started smoking at age 37.   He is smoking 1 1/2 packs/day.  Patient is employed as as full time  Probation officer. Patient denies known exposures to radiation on toxins. The patient is accompanied by a woman today.  Allergies: No Known Allergies  Current Medications: Current Outpatient Medications  Medication Sig Dispense Refill  . acetaminophen (TYLENOL) 500 MG tablet Take 500 mg by mouth 3 (three) times daily as needed.    Marland Kitchen aspirin EC 81 MG tablet Take 81 mg by mouth daily.    . cyclobenzaprine (FLEXERIL) 10 MG tablet Take 1 tablet (10 mg total) by mouth 3 (three) times daily as needed for muscle spasms. 30 tablet 0  . dexamethasone (DECADRON) 4 MG tablet Take 1 tablet (4 mg total) by mouth 3 (three) times daily. 90 tablet 0  . docusate sodium (COLACE) 100 MG capsule Take 1 capsule (100 mg total) by mouth daily as needed. 30 capsule 2  . folic acid (FOLVITE) 1 MG tablet Take 1 tablet (1 mg total) by mouth daily. 90 tablet 1  . ibuprofen (ADVIL,MOTRIN) 200 MG tablet Take 200 mg by mouth every 6 (six) hours as needed.    . lidocaine-prilocaine (EMLA) cream Apply to affected area once 30 g 3  . Oxycodone HCl 10 MG TABS Take 1 tablet (10 mg total) by mouth every 4 (four) hours as needed. 60 tablet 0  . polyethylene glycol (MIRALAX / GLYCOLAX) packet Take 17 g by mouth daily.    . sucralfate (CARAFATE) 1 g tablet Take 1 tablet (1 g total) by mouth 3 (three) times daily before meals. Dissolve in warM water, swish and swallow 90 tablet 4  . dexamethasone (DECADRON) 4 MG tablet Take 1 tab two times a day the day before [place] chemo, then take 2 tabs once a day for 3 days starting the day after chemo. (Patient not taking: Reported on 07/05/2018) 30 tablet 1  . ondansetron (ZOFRAN) 8 MG tablet Take 1 tablet (8 mg total) by mouth 2 (two) times daily as needed (Nausea or vomiting). Start if needed on the third day after chemotherapy. (Patient not taking: Reported on 07/05/2018) 30 tablet 1   No current facility-administered medications for this visit.     Review of Systems  Constitutional: Positive  for weight loss (3 pounds). Negative for chills, diaphoresis, fever and malaise/fatigue.       Feels better.  HENT: Negative.  Negative for congestion, ear discharge, ear pain, nosebleeds, sinus pain, sore throat and tinnitus.   Eyes: Negative.  Negative for blurred vision, double vision, photophobia, pain, discharge and redness.  Respiratory: Positive for cough (slight). Negative for hemoptysis, sputum production, shortness of breath (breathing better) and wheezing.   Cardiovascular: Negative.  Negative for chest pain, palpitations, orthopnea, leg swelling and PND.  Gastrointestinal: Negative.  Negative for abdominal pain, blood in stool, constipation, diarrhea, melena, nausea and vomiting.  Genitourinary: Negative.  Negative for dysuria, frequency, hematuria and urgency.  Musculoskeletal: Positive for back pain (T4 bone metastasis, improved). Negative for falls, joint pain, myalgias and neck pain (improved).  Skin: Negative.  Negative for itching and rash.  Neurological: Positive for sensory change (chronic numbness to LEFT heel 2/2  spinal disk issues). Negative for dizziness, tingling, tremors, speech change, focal weakness, weakness and headaches.  Endo/Heme/Allergies: Negative.  Does not bruise/bleed easily.  Psychiatric/Behavioral: Negative for depression and memory loss. The patient is not nervous/anxious and does not have insomnia.   All other systems reviewed and are negative.  Performance status (ECOG): 1  Vital Signs BP 128/83 (BP Location: Left Arm, Patient Position: Sitting)   Pulse 78   Temp 97.8 F (36.6 C) (Tympanic)   Resp 18   Wt 160 lb 5 oz (72.7 kg)   SpO2 95%   BMI 25.11 kg/m   Physical Exam  Constitutional: He is oriented to person, place, and time and well-developed, well-nourished, and in no distress. No distress.  HENT:  Head: Normocephalic.  Mouth/Throat: Oropharynx is clear and moist and mucous membranes are normal. He has dentures. No oropharyngeal exudate.   Gray hair.  Mustache.  Eyes: Pupils are equal, round, and reactive to light. Conjunctivae are normal. No scleral icterus.  Glasses.  Blue eyes.  Neck: Normal range of motion. Neck supple. No JVD present.  Cardiovascular: Normal rate, regular rhythm, normal heart sounds and intact distal pulses. Exam reveals no gallop and no friction rub.  No murmur heard. Pulmonary/Chest: Effort normal and breath sounds normal. No respiratory distress. He has no wheezes. He has no rales.  Abdominal: Soft. Bowel sounds are normal. He exhibits no distension and no mass. There is no abdominal tenderness. There is no rebound and no guarding.  Musculoskeletal: Normal range of motion.        General: Tenderness (slight pain on T4 palpation.  No cervical spine tenderness.) present. No deformity or edema.     Thoracic back: He exhibits tenderness and pain.  Lymphadenopathy:    He has no cervical adenopathy.    He has no axillary adenopathy.       Right: No supraclavicular adenopathy present.       Left: No supraclavicular adenopathy present.  Neurological: He is alert and oriented to person, place, and time. Gait normal.  Skin: Skin is dry. No rash noted. He is not diaphoretic. No erythema.  Psychiatric: Mood, affect and judgment normal.  Nursing note and vitals reviewed.   Infusion on 07/05/2018  Component Date Value Ref Range Status  . Magnesium 07/05/2018 2.4  1.7 - 2.4 mg/dL Final   Performed at [facility], [address], [place], [place] [address]  . Sodium 07/05/2018 138  135 - 145 mmol/L Final  . Potassium 07/05/2018 4.1  3.5 - 5.1 mmol/L Final  . Chloride 07/05/2018 104  98 - 111 mmol/L Final  . CO2 07/05/2018 23  22 - 32 mmol/L Final  . Glucose, Bld 07/05/2018 132* 70 - 99 mg/dL Final  . BUN 07/05/2018 17  8 - 23 mg/dL Final  . Creatinine, Ser 07/05/2018 0.84  0.61 - 1.24 mg/dL Final  . Calcium 07/05/2018 8.9  8.9 - 10.3 mg/dL Final  . Total Protein 07/05/2018 6.9  6.5 - 8.1 g/dL Final   . Albumin 07/05/2018 3.9  3.5 - 5.0 g/dL Final  . AST 07/05/2018 24  15 - 41 U/L Final  . ALT 07/05/2018 27  0 - 44 U/L Final  . Alkaline Phosphatase 07/05/2018 72  38 - 126 U/L Final  . Total Bilirubin 07/05/2018 1.1  0.3 - 1.2 mg/dL Final  . GFR calc non Af Amer 07/05/2018 >60  >60 mL/min Final  . GFR calc Af Amer 07/05/2018 >60  >60 mL/min Final  . Georgiann Hahn  gap 07/05/2018 11  5 - 15 Final   Performed at [facility], [address] [place], [place] [address]  . WBC 07/05/2018 10.5  4.0 - 10.5 K/uL Final  . RBC 07/05/2018 5.11  4.22 - 5.81 MIL/uL Final  . Hemoglobin 07/05/2018 14.1  13.0 - 17.0 g/dL Final  . HCT 07/05/2018 44.6  39.0 - 52.0 % Final  . MCV 07/05/2018 87.3  80.0 - 100.0 fL Final  . MCH 07/05/2018 27.6  26.0 - 34.0 pg Final  . MCHC 07/05/2018 31.6  30.0 - 36.0 g/dL Final  . RDW 07/05/2018 13.6  11.5 - 15.5 % Final  . Platelets 07/05/2018 200  150 - 400 K/uL Final  . nRBC 07/05/2018 0.0  0.0 - 0.2 % Final  . Neutrophils Relative % 07/05/2018 89  % Final  . Neutro Abs 07/05/2018 9.4* 1.7 - 7.7 K/uL Final  . Lymphocytes Relative 07/05/2018 5  % Final  . Lymphs Abs 07/05/2018 0.6* 0.7 - 4.0 K/uL Final  . Monocytes Relative 07/05/2018 5  % Final  . Monocytes Absolute 07/05/2018 0.5  0.1 - 1.0 K/uL Final  . Eosinophils Relative 07/05/2018 0  % Final  . Eosinophils Absolute 07/05/2018 0.0  0.0 - 0.5 K/uL Final  . Basophils Relative 07/05/2018 0  % Final  . Basophils Absolute 07/05/2018 0.0  0.0 - 0.1 K/uL Final  . Immature Granulocytes 07/05/2018 1  % Final  . Abs Immature Granulocytes 07/05/2018 0.08* 0.00 - 0.07 K/uL Final   Performed at [facility], [address] [place], [place] [address]    Assessment:  Jimmy West is a 67 y.o. 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  Pathology revealed an invasive high-grade adenocarcinoma, with predominantly solid growth pattern.   The neoplastic cells were TTF-1 (+), Napsin A (+), and P40 (+).  He has a T4 vertebral metastasis.  Clinical stage is T4N1M1.  There was not enough material for Foundation One testing.  PDL-1 revealed TPS 90%.  Chest CT angiogram on 04/27/2018 revealed no evidence of significant pulmonary embolus.  There was a 4.1 cm mass in the superior segment right lower lobe posteriorly causing obliteration of the in coming bronchus and mild postobstructive change. There were multiple additional bilateral pulmonary nodules (< 1 cm - 1.4 cm).   There were enlarged right hilar lymph nodes.  There was metastasis to the T4 vertebra with mild associated compression.  There was emphysematous changes in the lungs.   PET scan on 04/30/2018 revealed a 3.4 cm hypermetabolic RLL pulmonary mass (SUV 13.2), 11 mm pulmonary nodule in the LEFT lung (SUV 4.5), RIGHT paratracheal lymph node (SUV 5.1), and hypermetabolic activity within the T4 vertebral body (SUV 10.2).   Thoracic spine MRI on 05/03/2018 revealed T4 metastasis with a 40% pathologic compression deformity and 5 mm of retropulsion of the vertebral body.  Retropulsion results in mild spinal canal stenosis and mild bilateral C4-5 foraminal stenosis.  There was paravertebral soft tissue thickening from mid T3 to mid T5, likely representing edema related to the pathologic compression deformity vs. possible extraosseous extension of the neoplasm.  There were no additional thoracic spinal metastases noted.   Head MRI on 05/03/2018 revealed no intracranial metastatic disease.  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  Incidental mention made of mild paranasal sinus disease.  He is day 22 s/p cycle #2 pembrolizumab (05/24/2018 - 06/14/2018).  He is tolerating treatment well.  He receives Xgeva monthly  (06/03/2018).  Symptomatically, his back pain has improved.  He is taking Decadron 4 mg BID.  Exam reveals mild tenderness to palpation at T4.  Plan: 1. Labs today:  CBC with diff, CMP, Mg, TSH. 2. Metastatic high-grade adenocarcinoma of the RIGHT lung: Patient s/p 2 cycles of pembrolizumab. Discuss plan to reinitiate pembrolizumab once off steroids and radiation complete. 3. T4 osseous metastases:  Patient is currently day 8 of 10 radiation.   Patient initially seen for possible kyphoplasty by Dr. Rudene Christians.  He was then referred to Dr Bonney Aid at [facility].  Discuss with Dr. Izora Ribas, neurosurgeon at [facility].  He will discuss with the other [place].  He is scheduled for [facility] consultation on 07/19/2018.  Continue monthly Xgeva- due today.  Discuss calcium 1200 mg and vitamin D 800 IU supplementation. 4. Pain and symptom management: Pain well controlled. Discuss plan to taper Decadron to off.  Patient will decrease Decadron to 4 mg a day x 2-3 days then 4 mg QOD (last dose on 07/11/2018). Will discuss Decadron taper schedule with Dr. Baruch Gouty. 5. RTC on 07/12/2018 for MD assessment, labs (CBC with diff, CMP, Mg, TSH), and cycle #3 pembrolizumab.   Honor Loh, NP  07/05/2018, 9:51 AM   I saw and evaluated the patient, participating in the key portions of the service and reviewing pertinent diagnostic studies and records.  I reviewed the nurse practitioner's note and agree with the findings and the plan.  The assessment and plan were discussed with the patient.  Multiple questions were asked by the patient and answered.   Nolon Stalls, MD 07/05/2018,9:51 AM

## 2018-07-05 NOTE — Patient Instructions (Signed)
Please start calcium 1200 mg and vitamin D 800 IU daily.

## 2018-07-06 ENCOUNTER — Ambulatory Visit
Admission: RE | Admit: 2018-07-06 | Discharge: 2018-07-06 | Disposition: A | Discharge status: Home / Self Care | Payer: Medicare Other | Source: Ambulatory Visit | Attending: Radiation Oncology | Admitting: Radiation Oncology

## 2018-07-06 ENCOUNTER — Ambulatory Visit: Payer: Medicare Other

## 2018-07-06 DIAGNOSIS — C7951 Secondary malignant neoplasm of bone: Secondary | ICD-10-CM | POA: Diagnosis not present

## 2018-07-06 DIAGNOSIS — Z87891 Personal history of nicotine dependence: Secondary | ICD-10-CM | POA: Diagnosis not present

## 2018-07-06 DIAGNOSIS — Z510 Encounter for antineoplastic radiation therapy: Secondary | ICD-10-CM | POA: Diagnosis not present

## 2018-07-06 DIAGNOSIS — C3431 Malignant neoplasm of lower lobe, right bronchus or lung: Secondary | ICD-10-CM | POA: Diagnosis not present

## 2018-07-07 ENCOUNTER — Other Ambulatory Visit: Payer: Self-pay

## 2018-07-07 ENCOUNTER — Ambulatory Visit: Payer: Medicare Other

## 2018-07-07 ENCOUNTER — Inpatient Hospital Stay (HOSPITAL_BASED_OUTPATIENT_CLINIC_OR_DEPARTMENT_OTHER): Payer: Medicare Other | Admitting: Hospice and Palliative Medicine

## 2018-07-07 ENCOUNTER — Ambulatory Visit
Admission: RE | Admit: 2018-07-07 | Discharge: 2018-07-07 | Disposition: A | Discharge status: Home / Self Care | Payer: Medicare Other | Source: Ambulatory Visit | Attending: Radiation Oncology | Admitting: Radiation Oncology

## 2018-07-07 ENCOUNTER — Ambulatory Visit
Admission: RE | Admit: 2018-07-07 | Discharge: 2018-07-07 | Disposition: A | Discharge status: Home / Self Care | Payer: Medicare Other | Source: Ambulatory Visit | Attending: Hospice and Palliative Medicine | Admitting: Hospice and Palliative Medicine

## 2018-07-07 ENCOUNTER — Encounter: Payer: Self-pay | Admitting: Hospice and Palliative Medicine

## 2018-07-07 ENCOUNTER — Inpatient Hospital Stay: Payer: Medicare Other

## 2018-07-07 VITALS — BP 111/74 | HR 102 | Temp 100.7°F | Resp 18

## 2018-07-07 DIAGNOSIS — R509 Fever, unspecified: Secondary | ICD-10-CM

## 2018-07-07 DIAGNOSIS — Z87891 Personal history of nicotine dependence: Secondary | ICD-10-CM | POA: Diagnosis not present

## 2018-07-07 DIAGNOSIS — G893 Neoplasm related pain (acute) (chronic): Secondary | ICD-10-CM | POA: Diagnosis not present

## 2018-07-07 DIAGNOSIS — Z510 Encounter for antineoplastic radiation therapy: Secondary | ICD-10-CM | POA: Diagnosis not present

## 2018-07-07 DIAGNOSIS — C3431 Malignant neoplasm of lower lobe, right bronchus or lung: Secondary | ICD-10-CM | POA: Diagnosis not present

## 2018-07-07 DIAGNOSIS — R5383 Other fatigue: Secondary | ICD-10-CM

## 2018-07-07 DIAGNOSIS — Z95828 Presence of other vascular implants and grafts: Secondary | ICD-10-CM

## 2018-07-07 DIAGNOSIS — R05 Cough: Secondary | ICD-10-CM | POA: Diagnosis not present

## 2018-07-07 DIAGNOSIS — C7951 Secondary malignant neoplasm of bone: Secondary | ICD-10-CM | POA: Diagnosis not present

## 2018-07-07 DIAGNOSIS — Z5111 Encounter for antineoplastic chemotherapy: Secondary | ICD-10-CM | POA: Diagnosis not present

## 2018-07-07 DIAGNOSIS — Z79899 Other long term (current) drug therapy: Secondary | ICD-10-CM

## 2018-07-07 DIAGNOSIS — R531 Weakness: Secondary | ICD-10-CM | POA: Diagnosis not present

## 2018-07-07 DIAGNOSIS — I951 Orthostatic hypotension: Secondary | ICD-10-CM

## 2018-07-07 DIAGNOSIS — Z515 Encounter for palliative care: Secondary | ICD-10-CM

## 2018-07-07 LAB — CBC WITH DIFFERENTIAL/PLATELET
ABS IMMATURE GRANULOCYTES: 0.07 10*3/uL (ref 0.00–0.07)
Basophils Absolute: 0 10*3/uL (ref 0.0–0.1)
Basophils Relative: 0 %
Eosinophils Absolute: 0.2 10*3/uL (ref 0.0–0.5)
Eosinophils Relative: 1 %
HCT: 44.7 % (ref 39.0–52.0)
Hemoglobin: 14.5 g/dL (ref 13.0–17.0)
IMMATURE GRANULOCYTES: 1 %
Lymphocytes Relative: 9 %
Lymphs Abs: 1 10*3/uL (ref 0.7–4.0)
MCH: 28.3 pg (ref 26.0–34.0)
MCHC: 32.4 g/dL (ref 30.0–36.0)
MCV: 87.3 fL (ref 80.0–100.0)
Monocytes Absolute: 1 10*3/uL (ref 0.1–1.0)
Monocytes Relative: 9 %
NEUTROS ABS: 8.7 10*3/uL — AB (ref 1.7–7.7)
Neutrophils Relative %: 80 %
Platelets: 169 10*3/uL (ref 150–400)
RBC: 5.12 MIL/uL (ref 4.22–5.81)
RDW: 14.1 % (ref 11.5–15.5)
WBC: 11 10*3/uL — ABNORMAL HIGH (ref 4.0–10.5)
nRBC: 0 % (ref 0.0–0.2)

## 2018-07-07 LAB — INFLUENZA PANEL BY PCR (TYPE A & B)
Influenza A By PCR: NEGATIVE
Influenza B By PCR: NEGATIVE

## 2018-07-07 LAB — URINALYSIS, COMPLETE (UACMP) WITH MICROSCOPIC
Bacteria, UA: NONE SEEN
Bilirubin Urine: NEGATIVE
Glucose, UA: NEGATIVE mg/dL
Hgb urine dipstick: NEGATIVE
Ketones, ur: NEGATIVE mg/dL
Leukocytes, UA: NEGATIVE
Nitrite: NEGATIVE
Protein, ur: NEGATIVE mg/dL
Specific Gravity, Urine: 1.008 (ref 1.005–1.030)
Squamous Epithelial / HPF: NONE SEEN (ref 0–5)
pH: 5 (ref 5.0–8.0)

## 2018-07-07 LAB — BASIC METABOLIC PANEL
Anion gap: 8 (ref 5–15)
BUN: 17 mg/dL (ref 8–23)
CO2: 24 mmol/L (ref 22–32)
Calcium: 8.4 mg/dL — ABNORMAL LOW (ref 8.9–10.3)
Chloride: 106 mmol/L (ref 98–111)
Creatinine, Ser: 0.92 mg/dL (ref 0.61–1.24)
GFR calc Af Amer: >60 mL/min (ref >60)
GFR calc non Af Amer: >60 mL/min (ref >60)
Glucose, Bld: 97 mg/dL (ref 70–99)
POTASSIUM: 3.9 mmol/L (ref 3.5–5.1)
Sodium: 138 mmol/L (ref 135–145)

## 2018-07-07 MED ORDER — SODIUM CHLORIDE 0.9% FLUSH
10.0000 mL | INTRAVENOUS | Status: DC | PRN
  Administered 2018-07-07: 10 mL via INTRAVENOUS
  Filled 2018-07-07: qty 10

## 2018-07-07 MED ORDER — SODIUM CHLORIDE 0.9 % IV SOLN
Freq: Once | INTRAVENOUS | Status: AC
  Administered 2018-07-07: 14:00:00 via INTRAVENOUS
  Filled 2018-07-07: qty 250

## 2018-07-07 MED ORDER — OXYCODONE HCL 10 MG PO TABS: 10.0000 mg | ORAL_TABLET | ORAL | 0 refills | Status: DC | PRN

## 2018-07-07 MED ORDER — OXYCODONE HCL 5 MG PO TABS
10.0000 mg | ORAL_TABLET | Freq: Once | ORAL | Status: AC
  Administered 2018-07-07: 10 mg via ORAL
  Filled 2018-07-07: qty 2

## 2018-07-07 MED ORDER — HEPARIN SOD (PORK) LOCK FLUSH 100 UNIT/ML IV SOLN
500.0000 [IU] | Freq: Once | INTRAVENOUS | Status: AC
  Administered 2018-07-07: 500 [IU] via INTRAVENOUS
  Filled 2018-07-07: qty 5

## 2018-07-07 MED ORDER — OXYCODONE HCL ER 10 MG PO T12A: 10.0000 mg | EXTENDED_RELEASE_TABLET | Freq: Two times a day (BID) | ORAL | 0 refills | Status: DC

## 2018-07-07 NOTE — Progress Notes (Signed)
[place]  [phone] [phone]   Name: Jimmy West Date: 07/07/2018 MRN: [id number]  DOB: [birth year]  Patient Care Team: [name], NP as PCP - General (Nurse Practitioner) Telford Nab, RN as Registered Nurse Mc[place]ane, Gerda Diss, MD as Attending Physician (Emergency Medicine)    REASON FOR CONSULTATION: Palliative Care consult requested for this 67 y.o. male with multiple medical problems including presumed metastatic lung cancer.  Patient was noted to have pulmonary nodules on recent chest x-ray from PCP.  He was subsequently referred to oncology for work-up and saw Dr. Mike Gip on 04/30/2018.  PET scan revealed several hypermetabolic pulmonary nodules and activity within the T4 vertebral body.  Follow-up MRI of the spine revealed T4 metastasis with pathologic compression deformity and mild spinal canal stenosis with probable edema and extraosseous extension of neoplasm from mid T3 to mid T5. Patient was referred to the palliative care clinic to assist with symptom management and establishing treatment goals.   SOCIAL HISTORY:    Patient has not married and has no children.  He has a brother who lives out of state.  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  ADVANCE DIRECTIVES:  Does not have.  CODE STATUS: Full code  PAST MEDICAL HISTORY: Past Medical History:  Diagnosis Date  . Bulging lumbar disc   . Cancer ([place])   . Heart attack ([place])     PAST SURGICAL HISTORY:  Past Surgical History:  Procedure Laterality Date  . CARDIAC CATHETERIZATION     two stents  . KNEE SURGERY Left   . PORTA CATH INSERTION N/A 05/21/2018   Procedure: PORTA CATH INSERTION;  Surgeon: Algernon Huxley, MD;  Location: [facility];  Service: Cardiovascular;  Laterality: N/A;    HEMATOLOGY/ONCOLOGY HISTORY:    Primary cancer of right lower lobe of lung ([place])   04/26/2018 Initial Diagnosis    Primary cancer of right lower lobe of lung ([place])    05/17/2018 -  Chemotherapy    The patient had pembrolizumab (KEYTRUDA) 200 mg in sodium chloride 0.9 % 50 mL chemo infusion, 200 mg, Intravenous, Once, 2 of 6 cycles Administration: 200 mg (05/24/2018), 200 mg (06/14/2018)  for chemotherapy treatment.      ALLERGIES:  has No Known Allergies.  MEDICATIONS:  Current Outpatient Medications  Medication Sig Dispense Refill  . acetaminophen (TYLENOL) 500 MG tablet Take 500 mg by mouth 3 (three) times daily as needed.    Marland Kitchen aspirin EC 81 MG tablet Take 81 mg by mouth daily.    . cyclobenzaprine (FLEXERIL) 10 MG tablet Take 1 tablet (10 mg total) by mouth 3 (three) times daily as needed for muscle spasms. 30 tablet 0  . docusate sodium (COLACE) 100 MG capsule Take 1 capsule (100 mg total) by mouth daily as needed. 30 capsule 2  . folic acid (FOLVITE) 1 MG tablet Take 1 tablet (1 mg total) by mouth daily. 90 tablet 1  . ibuprofen (ADVIL,MOTRIN) 200 MG tablet Take 200 mg by mouth every 6 (six) hours as needed.    . lidocaine-prilocaine (EMLA) cream Apply to affected area once 30 g 3  . Oxycodone HCl 10 MG TABS Take 1 tablet (10 mg total) by mouth every 4 (four) hours as needed. 60 tablet 0  . polyethylene glycol (MIRALAX / GLYCOLAX) packet Take 17 g by mouth daily.    . sucralfate (CARAFATE) 1 g tablet Take 1  tablet (1 g total) by mouth 3 (three) times daily before meals. Dissolve in warM water, swish and swallow 90 tablet 4  . dexamethasone (DECADRON) 4 MG tablet Take 1 tab two times a day the day before [place] chemo, then take 2 tabs once a day for 3 days starting the day after chemo. (Patient not taking: Reported on 07/05/2018) 30 tablet 1  . ondansetron (ZOFRAN) 8 MG tablet Take 1 tablet (8 mg total) by mouth 2 (two) times daily as needed (Nausea or vomiting). Start if needed on the third day after chemotherapy. (Patient not taking: Reported on 07/05/2018) 30 tablet  1   No current facility-administered medications for this visit.    Facility-Administered Medications Ordered in Other Visits  Medication Dose Route Frequency Provider Last Rate Last Dose  . 0.9 %  sodium chloride infusion   Intravenous Once Meera Vasco, Vonna Kotyk R, NP      . heparin lock flush 100 unit/mL  500 Units Intravenous Once Everli Rother, Vonna Kotyk R, NP      . sodium chloride flush (NS) 0.9 % injection 10 mL  10 mL Intravenous PRN Oneta Sigman, Kirt Boys, NP        VITAL SIGNS: BP 111/74 (Patient Position: Sitting) Comment: standing bp 90/54  Pulse (!) 102 Comment: standing pulse 108  Temp (!) 100.7 F (38.2 C) (Tympanic)   Resp 18  Filed Weights    Estimated body mass index is 25.11 kg/m as calculated from the following:   Height as of 06/14/18: 5\' 7"  (1.702 m).   Weight as of 07/05/18: 160 lb 5 oz (72.7 kg).  LABS: CBC:    Component Value Date/Time   WBC 10.5 07/05/2018 0907   HGB 14.1 07/05/2018 0907   HGB 14.9 04/06/2014 0826   HCT 44.6 07/05/2018 0907   HCT 44.8 04/06/2014 0826   PLT 200 07/05/2018 0907   PLT 238 04/06/2014 0826   MCV 87.3 07/05/2018 0907   MCV 95 04/06/2014 0826   NEUTROABS 9.4 (H) 07/05/2018 0907   NEUTROABS 3.9 04/06/2014 0826   LYMPHSABS 0.6 (L) 07/05/2018 0907   LYMPHSABS 1.1 04/06/2014 0826   MONOABS 0.5 07/05/2018 0907   MONOABS 0.7 04/06/2014 0826   EOSABS 0.0 07/05/2018 0907   EOSABS 0.1 04/06/2014 0826   BASOSABS 0.0 07/05/2018 0907   BASOSABS 0.1 04/06/2014 0826   Comprehensive Metabolic Panel:    Component Value Date/Time   NA 138 07/05/2018 0907   NA 140 04/06/2014 0826   K 4.1 07/05/2018 0907   K 4.0 04/06/2014 0826   CL 104 07/05/2018 0907   CL 108 (H) 04/06/2014 0826   CO2 23 07/05/2018 0907   CO2 25 04/06/2014 0826   BUN 17 07/05/2018 0907   BUN 12 04/06/2014 0826   CREATININE 0.84 07/05/2018 0907   CREATININE 1.17 04/06/2014 0826   GLUCOSE 132 (H) 07/05/2018 0907   GLUCOSE 106 (H) 04/06/2014 0826   CALCIUM 8.9 07/05/2018  0907   CALCIUM 8.9 04/06/2014 0826   AST 24 07/05/2018 0907   AST 24 04/06/2014 0826   ALT 27 07/05/2018 0907   ALT 33 04/06/2014 0826   ALKPHOS 72 07/05/2018 0907   ALKPHOS 85 04/06/2014 0826   BILITOT 1.1 07/05/2018 0907   BILITOT 0.8 04/06/2014 0826   PROT 6.9 07/05/2018 0907   PROT 7.3 04/06/2014 0826   ALBUMIN 3.9 07/05/2018 0907   ALBUMIN 3.8 04/06/2014 0826    RADIOGRAPHIC STUDIES: Dg Chest 2 View  Result Date: 06/14/2018 CLINICAL DATA:  Shortness of  breath today in a patient with a history of lung cancer. EXAM: CHEST - 2 VIEW COMPARISON:  Single-view of the chest 05/11/2018. CT chest 05/17/2018. FINDINGS: Right lower lobe pulmonary mass and scattered bilateral pulmonary nodules are again seen. The patient has a new small right pleural effusion and basilar airspace disease. No left effusion. No pneumothorax. Heart size is normal. Port-A-Cath is in place. IMPRESSION: Small right pleural effusion and right basilar airspace disease, likely due to atelectasis, are new since the comparison examinations. Right lower lobe mass and pulmonary nodules consistent with the patient's known history of lung cancer. Electronically Signed   By: Inge Rise M.D.   On: 06/14/2018 10:56   Dg Cervical Spine Complete  Result Date: 06/24/2018 CLINICAL DATA:  Left-sided neck pain without injury. EXAM: CERVICAL SPINE - COMPLETE 4+ VIEW COMPARISON:  None. FINDINGS: No fracture or spondylolisthesis is noted. Moderate degenerative disease is noted at C4-5, C5-6 and C6-7 with anterior osteophyte formation. No prevertebral soft tissue swelling is noted. Moderate bilateral neural foraminal stenosis is noted at C5-6 and C6-7 secondary to uncovertebral spurring. IMPRESSION: Multilevel degenerative disc disease. Bilateral neural foraminal stenosis secondary to uncovertebral spurring as described above. No acute abnormality seen in the cervical spine. Electronically Signed   By: Marijo Conception, M.D.   On:  06/24/2018 14:28   Ct Angio Chest Pe W Or Wo Contrast  Result Date: 06/14/2018 CLINICAL DATA:  Dyspnea on exertion chronic, history of metastatic lung carcinoma EXAM: CT ANGIOGRAPHY CHEST WITH CONTRAST TECHNIQUE: Multidetector CT imaging of the chest was performed using the standard protocol during bolus administration of intravenous contrast. Multiplanar CT image reconstructions and MIPs were obtained to evaluate the vascular anatomy. CONTRAST:  52mL OMNIPAQUE IOHEXOL 350 MG/ML SOLN COMPARISON:  Chest x-ray from earlier in the same day, PET-CT from 04/30/2018, CT of the chest dated 04/27/2018 FINDINGS: Cardiovascular: Thoracic aorta demonstrates atherosclerotic calcifications without aneurysmal dilatation or dissection. Coronary calcifications are seen as well as mild cardiac enlargement. Pulmonary artery shows a normal branching pattern. No filling defects to suggest pulmonary emboli are identified. Mediastinum/Nodes: Thoracic inlet is within normal limits. There are stable lymph nodes identified in the precarinal region just to the right of the midline as well as in the hila bilaterally worse on the right than the left. The esophagus appears within normal limits. Subcarinal lymph node is noted as well roughly stable from the prior study. Lungs/Pleura: Emphysematous changes are noted in the lungs bilaterally. Additionally there are multiple metastatic lesions throughout both lungs related to a large medial right lower lobe primary mass lesion. The majority of these are stable although 1 of the smaller nodules seen previously in the posterior aspect of the right upper lobe has demonstrated some increase in size best seen on image number 35 of series 6. It now measures 8 mm and previously measured 4 mm. A few lesions in the left upper lobe have also shown some slight increase in size when compared with the prior exam. Increasing right-sided pleural effusion is noted as well. Upper Abdomen: Thickening of the  left adrenal gland is again identified and stable. This has previously show no significant increased metabolic activity. The remainder of the upper abdomen is stable. A right renal cyst is seen as well. Musculoskeletal: T4 metastatic lesion is again identified with significant decrease in vertebral body height when compared with the prior exams. There has been at least 50-60% vertebral body height loss noted. No other bony lesions are seen. Review of the MIP  images confirms the above findings. IMPRESSION: Changes consistent with the known right lower lobe primary lung mass with multiple metastatic lesions throughout both lungs some of these demonstrates some increase in size when compared with the prior study. Associated mediastinal and hilar adenopathy is noted as well. New right pleural effusion is also seen. No evidence of pulmonary emboli. T4 metastatic lesion with significant decrease in vertebral body height when compared with the prior exam. Some posterior buckling of the vertebral body wall is noted. Aortic Atherosclerosis (ICD10-I70.0) and Emphysema (ICD10-J43.9). Electronically Signed   By: Inez Catalina M.D.   On: 06/14/2018 13:54    PERFORMANCE STATUS (ECOG) : 1 - Symptomatic but completely ambulatory  Review of Systems As noted above. Otherwise, a complete review of systems is negative.  Physical Exam General: NAD, frail appearing, thin HEENT: TM grey without serous exudate bilat, Throat erythematous without exudate, nares red Cardiovascular: regular rate and rhythm Pulmonary: clear ant fields Abdomen: soft, nontender, + bowel sounds GU: no suprapubic tenderness Extremities: no edema, no joint deformities Skin: no rashes Neurological: Weakness but otherwise nonfocal  IMPRESSION: Follow-up visit today in the clinic.  Patient says he has had 24 hours of body aches and fatigue. He has runny nose but denies cough, fever, or chills.  No urinary symptoms reported.  He was noted to have a  low-grade fever in the clinic of 100.7.  He was also found to be orthostatic.  Case discussed with Aaron Edelman NP and Dr. Mike Gip.  Will pursue work-up for FUO.  Will give fluids in the clinic today.  Discussed patient's pain.  He has been using oxycodone 10 mg every 4-6 hours.  He says the pain is variable day-to-day.  We talked about starting a long-acting opioid and he was agreeable to this plan.  Will start OxyContin 10 mg every 12 hours.  May continue OxyIR for breakthrough pain.  PLAN: 1. Refill oxycodone today, 10mg  # 60, no refills 2. Start OxyContin 10mg  Q12, #28 3. Magic mouthwash called in to pharmacy 4. Check CBC, CMET, UA with C&S, CXR, Blood Cultures x2, and influenza  5. Will give fluids 1L IV 6.  Will need to readdress ACP during a future visit 7.  RTC in 2 weeks.  Will try to coordinate next visit at time of appointment with Dr. Mike Gip.  Patient expressed understanding and was in agreement with this plan. He also understands that He can call clinic at any time with any questions, concerns, or complaints.   Time Total: 40 minutes  Visit consisted of counseling and education dealing with the complex and emotionally intense issues of symptom management and palliative care in the setting of serious and potentially life-threatening illness.Greater than 50%  of this time was spent counseling and coordinating care related to the above assessment and plan.  Signed by: [name], PhD, DNP, NP-C, [facility] [phone] (Work Cell)

## 2018-07-07 NOTE — Progress Notes (Signed)
Patient here today for follow up in the palliative care clinic. He states that he finished his radiation this morning and is extremely fatigued. He feels achy all over since yesterday and his throat is very sore from the radiation.

## 2018-07-08 ENCOUNTER — Ambulatory Visit: Payer: Medicare Other

## 2018-07-08 LAB — URINE CULTURE: Culture: NO GROWTH

## 2018-07-09 ENCOUNTER — Ambulatory Visit: Payer: Medicare Other

## 2018-07-09 ENCOUNTER — Telehealth: Payer: Self-pay | Admitting: Hospice and Palliative Medicine

## 2018-07-09 NOTE — Telephone Encounter (Signed)
I called to check on patient.  He says he is doing better in regards of body aches and denies any recent fever or chills.  However, his throat is still quite sore and it is limiting his oral intake of food and fluids.  He says he is been unable to tolerate the Magic mouthwash due to taste.  Pain medications are helping some but not fully alleviating his symptoms.  Offered follow-up visit in the clinic but he says he wants to think about it.  He may also use our [facility] as needed.

## 2018-07-11 ENCOUNTER — Other Ambulatory Visit: Payer: Self-pay | Admitting: Hematology and Oncology

## 2018-07-11 NOTE — Progress Notes (Signed)
[facility] day:  07/12/2018  Chief Complaint: Jimmy West is a 67 y.o. male with metastatic adenocarcinoma of the lung who is seen for assessment prior to cycle #3 pembrolizumab.  HPI:  The patient was last seen in the medical oncology clinic on 07/05/2018.  At that time, his back pain had improved.  He was taking Decadron 4 mg BID.  Exam revealed mild tenderness to palpation at T4.  He was day 8 of 10 radiation. We discussed tapering off of Decadron.  We discussed follow-up at [facility] for consideration of a kyphoplasty.  He received Xgeva.  He was seen by [name], NP on 07/07/2018.  He described 24 hours of body aches and fatigue.  He had a runny nose, but denied cough, fever or chills.  Temperature was 100.7.  He was orthostatic.  He underwent a fever work-up.  CXR was stable.  Influenza A and B were negative.  UA and culture was negative.  Blood cultures were negative.  He received 1 liter IVF.  Magic mouthwash was called in.  He began OxyContin 10 mg BID and continued oxycodone 10 mg q4-6h PRN pain.  Patient completed radiation therapy on 07/07/2018.  During the interim, patient continues to experienced decreased oral intake. Patient has dysphagia associated with his recent radiation therapy treatments. Patient notes that it is "slowly getting better". 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 .   Patient denies nausea, vomiting, and changes to his bowel habits. He denies any recurrent fevers. Cough has resolved. Pain management is intermittently effective. He states, "I have been taking more medication, but hurting worse". Patient is no longer on systemic steroid therapy. Patient has been scheduled to be seen  in consult at [facility] on 07/19/2018.  Patient denies pain in the clinic today.   Past Medical History:  Diagnosis Date  . Bulging lumbar disc   . Cancer ([place])   . Heart attack [facility])     Past Surgical History:  Procedure Laterality Date  . CARDIAC CATHETERIZATION     two stents  . KNEE SURGERY Left   . PORTA CATH INSERTION N/A 05/21/2018   Procedure: PORTA CATH INSERTION;  Surgeon: Algernon Huxley, MD;  Location: [facility];  Service: Cardiovascular;  Laterality: N/A;    Family History  Problem Relation Age of Onset  . Heart failure Father   . Cancer Maternal Aunt   . Heart failure Maternal Uncle   . Dementia Paternal Grandmother   . Cancer Maternal Aunt     Social History:  reports that he quit smoking about 14 years ago. His smoking use included cigarettes. He has a 22.50 pack-year smoking history. He has never used smokeless tobacco. He reports current alcohol use of about 15.0 standard drinks of alcohol per week. He reports that he does not use drugs. He started smoking at age 78.   He is smoking 1 1/2 packs/day.  Patient is employed as as full time Probation officer. Patient denies known exposures to radiation on toxins. The patient is accompanied by a woman today.  Allergies: No Known Allergies  Current Medications: Current Outpatient Medications  Medication Sig Dispense Refill  . acetaminophen (TYLENOL) 500 MG tablet Take 500 mg by mouth 3 (  three) times daily as needed.    Marland Kitchen aspirin EC 81 MG tablet Take 81 mg by mouth daily.    . cyclobenzaprine (FLEXERIL) 10 MG tablet Take 1 tablet (10 mg total) by mouth 3 (three) times daily as needed for muscle spasms. 30 tablet 0  . docusate sodium (COLACE) 100 MG capsule Take 1 capsule (100 mg total) by mouth daily as needed. 30 capsule 2  . ibuprofen (ADVIL,MOTRIN) 200 MG tablet Take 200 mg by mouth every 6 (six) hours as needed.    . lidocaine-prilocaine (EMLA) cream Apply to affected area once 30 g 3  . ondansetron (ZOFRAN) 8 MG  tablet Take 1 tablet (8 mg total) by mouth 2 (two) times daily as needed (Nausea or vomiting). Start if needed on the third day after chemotherapy. 30 tablet 1  . oxyCODONE (OXYCONTIN) 10 mg 12 hr tablet Take 1 tablet (10 mg total) by mouth every 12 (twelve) hours. 28 tablet 0  . Oxycodone HCl 10 MG TABS Take 1 tablet (10 mg total) by mouth every 4 (four) hours as needed. 60 tablet 0  . polyethylene glycol (MIRALAX / GLYCOLAX) packet Take 17 g by mouth daily.    . sucralfate (CARAFATE) 1 g tablet Take 1 tablet (1 g total) by mouth 3 (three) times daily before meals. Dissolve in warM water, swish and swallow 90 tablet 4  . dexamethasone (DECADRON) 4 MG tablet Take 1 tab two times a day the day before [place] chemo, then take 2 tabs once a day for 3 days starting the day after chemo. (Patient not taking: Reported on 07/05/2018) 30 tablet 1  . folic acid (FOLVITE) 1 MG tablet Take 1 tablet (1 mg total) by mouth daily. (Patient not taking: Reported on 07/12/2018) 90 tablet 1   No current facility-administered medications for this visit.    Facility-Administered Medications Ordered in Other Visits  Medication Dose Route Frequency Provider Last Rate Last Dose  . heparin lock flush 100 unit/mL  500 Units Intravenous Once Corcoran, Melissa C, MD      . sodium chloride flush (NS) 0.9 % injection 10 mL  10 mL Intravenous PRN Nolon Stalls C, MD   10 mL at 07/12/18 0930    Review of Systems  Constitutional: Positive for weight loss (3 pounds). Negative for chills, diaphoresis, fever and malaise/fatigue.       "I'm ok".  HENT: Positive for sore throat. Negative for congestion, ear discharge, ear pain, nosebleeds and sinus pain.   Eyes: Negative.  Negative for blurred vision, double vision, photophobia, pain, discharge and redness.  Respiratory: Positive for shortness of breath (improved). Negative for cough, hemoptysis, sputum production and wheezing.   Cardiovascular: Negative.  Negative for chest  pain, palpitations, orthopnea, leg swelling and PND.  Gastrointestinal: Negative.  Negative for abdominal pain, blood in stool, constipation, diarrhea, heartburn, melena, nausea and vomiting.       Dysphagia.  Genitourinary: Negative.  Negative for dysuria, frequency, hematuria and urgency.  Musculoskeletal: Positive for back pain (T4 bone metastasis). Negative for falls, joint pain, myalgias and neck pain (improved).  Skin: Negative.  Negative for itching and rash.  Neurological: Positive for sensory change (chronic numbness to LEFT heel 2/2 spinal disk issues). Negative for dizziness, tingling, tremors, speech change, focal weakness, weakness and headaches.  Endo/Heme/Allergies: Negative.  Does not bruise/bleed easily.  Psychiatric/Behavioral: Negative for depression and memory loss. The patient is not nervous/anxious and does not have insomnia.   All other systems reviewed and are negative.  Performance status (ECOG): 1  Vital Signs BP 133/77 (BP Location: Left Arm, Patient Position: Sitting)   Pulse 76   Temp 98.2 F (36.8 C) (Tympanic)   Resp 18   Wt 157 lb (71.2 kg)   BMI 24.59 kg/m   Physical Exam  Constitutional: He is oriented to person, place, and time and well-developed, well-nourished, and in no distress. No distress.  HENT:  Head: Normocephalic.  Mouth/Throat: Mucous membranes are normal. He has dentures.  Gray hair.  Mustache.  Tongue slightly coated.  Eyes: Pupils are equal, round, and reactive to light. Conjunctivae are normal. No scleral icterus.  Glasses.  Blue eyes.  Neck: Normal range of motion. Neck supple. No JVD present.  Cardiovascular: Normal rate, regular rhythm, normal heart sounds and intact distal pulses. Exam reveals no gallop and no friction rub.  No murmur heard. Pulmonary/Chest: Effort normal and breath sounds normal. No respiratory distress. He has no wheezes. He has no rales.  Abdominal: Soft. Bowel sounds are normal. He exhibits no distension and  no mass. There is no abdominal tenderness. There is no rebound and no guarding.  Musculoskeletal: Normal range of motion.        General: No deformity or edema.     Thoracic back: He exhibits pain.  Lymphadenopathy:    He has no cervical adenopathy.    He has no axillary adenopathy.       Right: No supraclavicular adenopathy present.       Left: No supraclavicular adenopathy present.  Neurological: He is alert and oriented to person, place, and time. Gait normal.  Skin: Skin is dry. No rash noted. He is not diaphoretic. There is erythema.  Erythema at T4 secondary to radiation.  Psychiatric: Mood, affect and judgment normal.  Nursing note and vitals reviewed.   Infusion on 07/12/2018  Component Date Value Ref Range Status  . TSH 07/12/2018 0.768  0.350 - 4.500 uIU/mL Final   Comment: Performed by a 3rd Generation assay with a functional sensitivity of <=0.01 uIU/mL. Performed at [facility], [address], [place], [place] [address]   . Sodium 07/12/2018 135  135 - 145 mmol/L Final  . Potassium 07/12/2018 4.2  3.5 - 5.1 mmol/L Final  . Chloride 07/12/2018 103  98 - 111 mmol/L Final  . CO2 07/12/2018 23  22 - 32 mmol/L Final  . Glucose, Bld 07/12/2018 120* 70 - 99 mg/dL Final  . BUN 07/12/2018 11  8 - 23 mg/dL Final  . Creatinine, Ser 07/12/2018 0.83  0.61 - 1.24 mg/dL Final  . Calcium 07/12/2018 8.4* 8.9 - 10.3 mg/dL Final  . Total Protein 07/12/2018 7.0  6.5 - 8.1 g/dL Final  . Albumin 07/12/2018 3.6  3.5 - 5.0 g/dL Final  . AST 07/12/2018 21  15 - 41 U/L Final  . ALT 07/12/2018 20  0 - 44 U/L Final  . Alkaline Phosphatase 07/12/2018 66  38 - 126 U/L Final  . Total Bilirubin 07/12/2018 1.8* 0.3 - 1.2 mg/dL Final  . GFR calc non Af Amer 07/12/2018 >60  >60 mL/min Final  . GFR calc Af Amer 07/12/2018 >60  >60 mL/min Final  . Anion gap 07/12/2018 9  5 - 15 Final   Performed at [facility], [address], [place], [place] [address]  . WBC 07/12/2018 5.8  4.0  - 10.5 K/uL Final  . RBC 07/12/2018 4.41  4.22 - 5.81 MIL/uL Final  . Hemoglobin 07/12/2018 12.4* 13.0 - 17.0 g/dL Final  .  HCT 07/12/2018 38.0* 39.0 - 52.0 % Final  . MCV 07/12/2018 86.2  80.0 - 100.0 fL Final  . MCH 07/12/2018 28.1  26.0 - 34.0 pg Final  . MCHC 07/12/2018 32.6  30.0 - 36.0 g/dL Final  . RDW 07/12/2018 14.1  11.5 - 15.5 % Final  . Platelets 07/12/2018 140* 150 - 400 K/uL Final  . nRBC 07/12/2018 0.0  0.0 - 0.2 % Final  . Neutrophils Relative % 07/12/2018 73  % Final  . Neutro Abs 07/12/2018 4.2  1.7 - 7.7 K/uL Final  . Lymphocytes Relative 07/12/2018 9  % Final  . Lymphs Abs 07/12/2018 0.5* 0.7 - 4.0 K/uL Final  . Monocytes Relative 07/12/2018 12  % Final  . Monocytes Absolute 07/12/2018 0.7  0.1 - 1.0 K/uL Final  . Eosinophils Relative 07/12/2018 5  % Final  . Eosinophils Absolute 07/12/2018 0.3  0.0 - 0.5 K/uL Final  . Basophils Relative 07/12/2018 1  % Final  . Basophils Absolute 07/12/2018 0.0  0.0 - 0.1 K/uL Final  . Immature Granulocytes 07/12/2018 0  % Final  . Abs Immature Granulocytes 07/12/2018 0.01  0.00 - 0.07 K/uL Final   Performed at [facility], [address] [place], [place] [address]    Assessment:  Jimmy West is a 67 y.o. 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  Pathology revealed an invasive high-grade adenocarcinoma, with predominantly solid growth pattern.  The neoplastic cells were TTF-1 (+), Napsin A (+), and P40 (+).  He has a T4 vertebral metastasis.  Clinical stage is T4N1M1.  There was not enough material for Foundation One testing.  PDL-1 revealed TPS 90%.  Chest CT angiogram on 04/27/2018 revealed no evidence of significant pulmonary embolus.  There was a 4.1 cm mass in the superior segment right lower lobe posteriorly causing obliteration of the in coming bronchus and mild postobstructive change. There were multiple additional bilateral  pulmonary nodules (< 1 cm - 1.4 cm).   There were enlarged right hilar lymph nodes.  There was metastasis to the T4 vertebra with mild associated compression.  There was emphysematous changes in the lungs.   PET scan on 04/30/2018 revealed a 3.4 cm hypermetabolic RLL pulmonary mass (SUV 13.2), 11 mm pulmonary nodule in the LEFT lung (SUV 4.5), RIGHT paratracheal lymph node (SUV 5.1), and hypermetabolic activity within the T4 vertebral body (SUV 10.2).   Thoracic spine MRI on 05/03/2018 revealed T4 metastasis with a 40% pathologic compression deformity and 5 mm of retropulsion of the vertebral body.  Retropulsion results in mild spinal canal stenosis and mild bilateral C4-5 foraminal stenosis.  There was paravertebral soft tissue thickening from mid T3 to mid T5, likely representing edema related to the pathologic compression deformity vs. possible extraosseous extension of the neoplasm.  There were no additional thoracic spinal metastases noted.   Head MRI on 05/03/2018 revealed no intracranial metastatic disease.  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  Incidental mention made of mild paranasal sinus disease.  He is s/p cycle #2 pembrolizumab (05/24/2018 - 06/14/2018).  He is tolerating treatment well.  He completed T4 radiation on 07/07/2018.  He receives Xgeva monthly (06/03/2018 - 07/05/2018).  Symptomatically, he continues to have back pain.  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  Bilirubin is 1.8.  Plan: 1. Labs today:  CBC with diff, CMP, direct bilirubin. 2. Metastatic high-grade adenocarcinoma of the RIGHT lung: Patient s/p 2 cycles of pembrolizumab. Patient off steroids and radiation complete. Pembrolizumab postponed secondary to increased bilirubin (? secondary to Gilbert's). Recheck labs  later this week.  If bilirubin normal, proceed with treatment. 3. T4 osseous metastases:  Patient completed radiation.   Patient scheduled for [facility] consultation on 07/19/2018.  Continue monthly Xgeva (last 07/05/2018).  Continue calcium 1200 mg and vitamin D 800 IU supplementation. 4. Pain and symptom management: T4 pain modestly controlled. Carafate for radiation esophagitis. Hydrocortisone 1% lotion BID for radiation dermatitis per Dr. Baruch Gouty. 5. RTC on 07/16/2018 for MD assessment, labs (CBC with diff, CMP), and cycle #3 pembrolizumab.   Honor Loh, NP  07/12/2018, 11:26 AM   I saw and evaluated the patient, participating in the key portions of the service and reviewing pertinent diagnostic studies and records.  I reviewed the nurse practitioner's note and agree with the findings and the plan.  The assessment and plan were discussed with the patient.  Several questions were asked by the patient and answered.   Nolon Stalls, MD 07/12/2018,11:26 AM

## 2018-07-12 ENCOUNTER — Other Ambulatory Visit: Payer: Self-pay

## 2018-07-12 ENCOUNTER — Ambulatory Visit: Payer: Medicare Other

## 2018-07-12 ENCOUNTER — Encounter: Payer: Self-pay | Admitting: Hematology and Oncology

## 2018-07-12 ENCOUNTER — Inpatient Hospital Stay (HOSPITAL_BASED_OUTPATIENT_CLINIC_OR_DEPARTMENT_OTHER): Payer: Medicare Other | Admitting: Hematology and Oncology

## 2018-07-12 ENCOUNTER — Inpatient Hospital Stay: Payer: Medicare Other

## 2018-07-12 VITALS — BP 133/77 | HR 76 | Temp 98.2°F | Resp 18 | Wt 157.0 lb

## 2018-07-12 DIAGNOSIS — R531 Weakness: Secondary | ICD-10-CM | POA: Diagnosis not present

## 2018-07-12 DIAGNOSIS — Z923 Personal history of irradiation: Secondary | ICD-10-CM

## 2018-07-12 DIAGNOSIS — R17 Unspecified jaundice: Secondary | ICD-10-CM

## 2018-07-12 DIAGNOSIS — G893 Neoplasm related pain (acute) (chronic): Secondary | ICD-10-CM | POA: Diagnosis not present

## 2018-07-12 DIAGNOSIS — C7951 Secondary malignant neoplasm of bone: Secondary | ICD-10-CM

## 2018-07-12 DIAGNOSIS — R5383 Other fatigue: Secondary | ICD-10-CM | POA: Diagnosis not present

## 2018-07-12 DIAGNOSIS — C3431 Malignant neoplasm of lower lobe, right bronchus or lung: Secondary | ICD-10-CM

## 2018-07-12 DIAGNOSIS — Z5111 Encounter for antineoplastic chemotherapy: Secondary | ICD-10-CM | POA: Diagnosis not present

## 2018-07-12 DIAGNOSIS — Z79899 Other long term (current) drug therapy: Secondary | ICD-10-CM | POA: Diagnosis not present

## 2018-07-12 LAB — COMPREHENSIVE METABOLIC PANEL
ALT: 20 U/L (ref 0–44)
AST: 21 U/L (ref 15–41)
Albumin: 3.6 g/dL (ref 3.5–5.0)
Alkaline Phosphatase: 66 U/L (ref 38–126)
Anion gap: 9 (ref 5–15)
BUN: 11 mg/dL (ref 8–23)
CO2: 23 mmol/L (ref 22–32)
Calcium: 8.4 mg/dL — ABNORMAL LOW (ref 8.9–10.3)
Chloride: 103 mmol/L (ref 98–111)
Creatinine, Ser: 0.83 mg/dL (ref 0.61–1.24)
GFR calc Af Amer: >60 mL/min (ref >60)
GFR calc non Af Amer: >60 mL/min (ref >60)
Glucose, Bld: 120 mg/dL — ABNORMAL HIGH (ref 70–99)
Potassium: 4.2 mmol/L (ref 3.5–5.1)
Sodium: 135 mmol/L (ref 135–145)
Total Bilirubin: 1.8 mg/dL — ABNORMAL HIGH (ref 0.3–1.2)
Total Protein: 7 g/dL (ref 6.5–8.1)

## 2018-07-12 LAB — CBC WITH DIFFERENTIAL/PLATELET
Abs Immature Granulocytes: 0.01 10*3/uL (ref 0.00–0.07)
Basophils Absolute: 0 10*3/uL (ref 0.0–0.1)
Basophils Relative: 1 %
Eosinophils Absolute: 0.3 10*3/uL (ref 0.0–0.5)
Eosinophils Relative: 5 %
HCT: 38 % — ABNORMAL LOW (ref 39.0–52.0)
Hemoglobin: 12.4 g/dL — ABNORMAL LOW (ref 13.0–17.0)
Immature Granulocytes: 0 %
Lymphocytes Relative: 9 %
Lymphs Abs: 0.5 10*3/uL — ABNORMAL LOW (ref 0.7–4.0)
MCH: 28.1 pg (ref 26.0–34.0)
MCHC: 32.6 g/dL (ref 30.0–36.0)
MCV: 86.2 fL (ref 80.0–100.0)
Monocytes Absolute: 0.7 10*3/uL (ref 0.1–1.0)
Monocytes Relative: 12 %
Neutro Abs: 4.2 10*3/uL (ref 1.7–7.7)
Neutrophils Relative %: 73 %
Platelets: 140 10*3/uL — ABNORMAL LOW (ref 150–400)
RBC: 4.41 MIL/uL (ref 4.22–5.81)
RDW: 14.1 % (ref 11.5–15.5)
WBC: 5.8 10*3/uL (ref 4.0–10.5)
nRBC: 0 % (ref 0.0–0.2)

## 2018-07-12 LAB — CULTURE, BLOOD (ROUTINE X 2)
Culture: NO GROWTH
Culture: NO GROWTH
Special Requests: ADEQUATE
Special Requests: ADEQUATE

## 2018-07-12 LAB — BILIRUBIN, DIRECT: Bilirubin, Direct: 0.3 mg/dL — ABNORMAL HIGH (ref 0.0–0.2)

## 2018-07-12 LAB — TSH: TSH: 0.768 u[IU]/mL (ref 0.350–4.500)

## 2018-07-12 MED ORDER — HEPARIN SOD (PORK) LOCK FLUSH 100 UNIT/ML IV SOLN
500.0000 [IU] | Freq: Once | INTRAVENOUS | Status: AC
  Administered 2018-07-12: 500 [IU] via INTRAVENOUS
  Filled 2018-07-12: qty 5

## 2018-07-12 MED ORDER — HYDROCORTISONE 1 % EX LOTN: 1.0000 "application " | TOPICAL_LOTION | Freq: Two times a day (BID) | CUTANEOUS | 0 refills | Status: DC

## 2018-07-12 MED ORDER — SODIUM CHLORIDE 0.9% FLUSH
10.0000 mL | INTRAVENOUS | Status: DC | PRN
  Administered 2018-07-12: 10 mL via INTRAVENOUS
  Filled 2018-07-12: qty 10

## 2018-07-12 NOTE — Progress Notes (Signed)
Patient here for follow up. No concerns voiced.  °

## 2018-07-12 NOTE — Progress Notes (Signed)
Per Honor Loh NP, no treatment at this time. Pt stable at discharge.

## 2018-07-13 ENCOUNTER — Ambulatory Visit: Payer: Medicare Other

## 2018-07-13 LAB — T4: T4, Total: 9.9 ug/dL (ref 4.5–12.0)

## 2018-07-14 DIAGNOSIS — R17 Unspecified jaundice: Secondary | ICD-10-CM | POA: Insufficient documentation

## 2018-07-15 ENCOUNTER — Other Ambulatory Visit: Payer: Self-pay | Admitting: *Deleted

## 2018-07-15 ENCOUNTER — Ambulatory Visit: Payer: Medicare Other

## 2018-07-15 NOTE — Telephone Encounter (Signed)
Pt requests refill of short acting oxycodone. Per Merrily Pew, would like to see in clinic tomorrow before scheduled treatment to discuss pain management. Pt scheduled to see Josh at 9:45am after pt sees Dr. Mike Gip. Pt made aware of appt and informed that his pain meds will be refilled tomorrow after discussing with Josh. Spoke with pt's friend, [name], who verbalized understanding.

## 2018-07-16 ENCOUNTER — Encounter: Payer: Self-pay | Admitting: Hematology and Oncology

## 2018-07-16 ENCOUNTER — Inpatient Hospital Stay: Payer: Medicare Other

## 2018-07-16 ENCOUNTER — Inpatient Hospital Stay (HOSPITAL_BASED_OUTPATIENT_CLINIC_OR_DEPARTMENT_OTHER): Payer: Medicare Other | Admitting: Hematology and Oncology

## 2018-07-16 ENCOUNTER — Other Ambulatory Visit: Payer: Self-pay

## 2018-07-16 ENCOUNTER — Inpatient Hospital Stay (HOSPITAL_BASED_OUTPATIENT_CLINIC_OR_DEPARTMENT_OTHER): Payer: Medicare Other | Admitting: Hospice and Palliative Medicine

## 2018-07-16 VITALS — BP 142/83 | HR 78 | Temp 97.9°F | Resp 20

## 2018-07-16 VITALS — Ht 67.0 in | Wt 157.0 lb

## 2018-07-16 DIAGNOSIS — C7951 Secondary malignant neoplasm of bone: Secondary | ICD-10-CM | POA: Diagnosis not present

## 2018-07-16 DIAGNOSIS — Z79899 Other long term (current) drug therapy: Secondary | ICD-10-CM | POA: Diagnosis not present

## 2018-07-16 DIAGNOSIS — Z515 Encounter for palliative care: Secondary | ICD-10-CM

## 2018-07-16 DIAGNOSIS — C3431 Malignant neoplasm of lower lobe, right bronchus or lung: Secondary | ICD-10-CM

## 2018-07-16 DIAGNOSIS — G893 Neoplasm related pain (acute) (chronic): Secondary | ICD-10-CM

## 2018-07-16 DIAGNOSIS — Z5112 Encounter for antineoplastic immunotherapy: Secondary | ICD-10-CM

## 2018-07-16 DIAGNOSIS — Z5111 Encounter for antineoplastic chemotherapy: Secondary | ICD-10-CM | POA: Diagnosis not present

## 2018-07-16 DIAGNOSIS — R5383 Other fatigue: Secondary | ICD-10-CM | POA: Diagnosis not present

## 2018-07-16 DIAGNOSIS — Z923 Personal history of irradiation: Secondary | ICD-10-CM

## 2018-07-16 DIAGNOSIS — R531 Weakness: Secondary | ICD-10-CM | POA: Diagnosis not present

## 2018-07-16 LAB — CBC WITH DIFFERENTIAL/PLATELET
Abs Immature Granulocytes: 0.01 10*3/uL (ref 0.00–0.07)
Basophils Absolute: 0 10*3/uL (ref 0.0–0.1)
Basophils Relative: 1 %
Eosinophils Absolute: 0.2 10*3/uL (ref 0.0–0.5)
Eosinophils Relative: 4 %
HCT: 38.9 % — ABNORMAL LOW (ref 39.0–52.0)
Hemoglobin: 12.4 g/dL — ABNORMAL LOW (ref 13.0–17.0)
Immature Granulocytes: 0 %
Lymphocytes Relative: 9 %
Lymphs Abs: 0.5 10*3/uL — ABNORMAL LOW (ref 0.7–4.0)
MCH: 27.4 pg (ref 26.0–34.0)
MCHC: 31.9 g/dL (ref 30.0–36.0)
MCV: 85.9 fL (ref 80.0–100.0)
Monocytes Absolute: 0.3 10*3/uL (ref 0.1–1.0)
Monocytes Relative: 6 %
Neutro Abs: 4.1 10*3/uL (ref 1.7–7.7)
Neutrophils Relative %: 80 %
Platelets: 195 10*3/uL (ref 150–400)
RBC: 4.53 MIL/uL (ref 4.22–5.81)
RDW: 14 % (ref 11.5–15.5)
WBC: 5.1 10*3/uL (ref 4.0–10.5)
nRBC: 0 % (ref 0.0–0.2)

## 2018-07-16 LAB — COMPREHENSIVE METABOLIC PANEL
ALT: 19 U/L (ref 0–44)
AST: 18 U/L (ref 15–41)
Albumin: 3.6 g/dL (ref 3.5–5.0)
Alkaline Phosphatase: 62 U/L (ref 38–126)
Anion gap: 8 (ref 5–15)
BUN: 10 mg/dL (ref 8–23)
CO2: 22 mmol/L (ref 22–32)
Calcium: 8.4 mg/dL — ABNORMAL LOW (ref 8.9–10.3)
Chloride: 108 mmol/L (ref 98–111)
Creatinine, Ser: 0.78 mg/dL (ref 0.61–1.24)
GFR calc Af Amer: >60 mL/min (ref >60)
GFR calc non Af Amer: >60 mL/min (ref >60)
Glucose, Bld: 152 mg/dL — ABNORMAL HIGH (ref 70–99)
Potassium: 3.9 mmol/L (ref 3.5–5.1)
Sodium: 138 mmol/L (ref 135–145)
Total Bilirubin: 1.2 mg/dL (ref 0.3–1.2)
Total Protein: 6.9 g/dL (ref 6.5–8.1)

## 2018-07-16 LAB — TSH: TSH: 0.382 u[IU]/mL (ref 0.350–4.500)

## 2018-07-16 LAB — MAGNESIUM: Magnesium: 2.3 mg/dL (ref 1.7–2.4)

## 2018-07-16 MED ORDER — SODIUM CHLORIDE 0.9 % IV SOLN
Freq: Once | INTRAVENOUS | Status: AC
  Administered 2018-07-16: 11:00:00 via INTRAVENOUS
  Filled 2018-07-16: qty 250

## 2018-07-16 MED ORDER — SODIUM CHLORIDE 0.9 % IV SOLN
200.0000 mg | Freq: Once | INTRAVENOUS | Status: AC
  Administered 2018-07-16: 200 mg via INTRAVENOUS
  Filled 2018-07-16: qty 8

## 2018-07-16 MED ORDER — OXYCODONE HCL 10 MG PO TABS: 10.0000 mg | ORAL_TABLET | ORAL | 0 refills | Status: DC | PRN

## 2018-07-16 MED ORDER — OXYCODONE HCL ER 30 MG PO T12A: 1.0000 | EXTENDED_RELEASE_TABLET | Freq: Two times a day (BID) | ORAL | 0 refills | Status: DC

## 2018-07-16 MED ORDER — SODIUM CHLORIDE 0.9% FLUSH
10.0000 mL | Freq: Once | INTRAVENOUS | Status: AC
  Administered 2018-07-16: 10 mL via INTRAVENOUS
  Filled 2018-07-16: qty 10

## 2018-07-16 MED ORDER — HEPARIN SOD (PORK) LOCK FLUSH 100 UNIT/ML IV SOLN
500.0000 [IU] | Freq: Once | INTRAVENOUS | Status: AC | PRN
  Administered 2018-07-16: 500 [IU]
  Filled 2018-07-16: qty 5

## 2018-07-16 NOTE — Progress Notes (Signed)
[place]  [phone] [phone]   Name: Burlon Centrella Date: 07/16/2018 MRN: [id number]  DOB: [birth year]  Patient Care Team: [name], NP as PCP - General (Nurse Practitioner) Telford Nab, RN as Registered Nurse [place], Gerda Diss, MD as Attending Physician (Emergency Medicine)    REASON FOR CONSULTATION: Palliative Care consult requested for this 67 y.o. male with multiple medical problems including presumed metastatic lung cancer.  Patient was noted to have pulmonary nodules on recent chest x-ray from PCP.  He was subsequently referred to oncology for work-up and saw Dr. Mike Gip on 04/30/2018.  PET scan revealed several hypermetabolic pulmonary nodules and activity within the T4 vertebral body.  Follow-up MRI of the spine revealed T4 metastasis with pathologic compression deformity and mild spinal canal stenosis with probable edema and extraosseous extension of neoplasm from mid T3 to mid T5. Patient was referred to the palliative care clinic to assist with symptom management and establishing treatment goals.   SOCIAL HISTORY:    Patient has not married and has no children.  He has a brother who lives out of state.  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  ADVANCE DIRECTIVES:  Does not have.  CODE STATUS: Full code  PAST MEDICAL HISTORY: Past Medical History:  Diagnosis Date  . Bulging lumbar disc   . Cancer ([place])   . Heart attack ([place])     PAST SURGICAL HISTORY:  Past Surgical History:  Procedure Laterality Date  . CARDIAC CATHETERIZATION     two stents  . KNEE SURGERY Left   . PORTA CATH INSERTION N/A 05/21/2018   Procedure: PORTA CATH INSERTION;  Surgeon: Algernon Huxley, MD;  Location: [facility];  Service: Cardiovascular;  Laterality: N/A;    HEMATOLOGY/ONCOLOGY HISTORY:    Primary cancer of right lower lobe of lung ([place])   04/26/2018 Initial Diagnosis    Primary cancer of right lower lobe of lung ([place])    05/24/2018 -  Chemotherapy    The patient had pembrolizumab (KEYTRUDA) 200 mg in sodium chloride 0.9 % 50 mL chemo infusion, 200 mg, Intravenous, Once, 2 of 6 cycles Administration: 200 mg (05/24/2018), 200 mg (06/14/2018)  for chemotherapy treatment.      ALLERGIES:  has No Known Allergies.  MEDICATIONS:  Current Outpatient Medications  Medication Sig Dispense Refill  . acetaminophen (TYLENOL) 500 MG tablet Take 500 mg by mouth 3 (three) times daily as needed.    Marland Kitchen aspirin EC 81 MG tablet Take 81 mg by mouth daily.    Marland Kitchen dexamethasone (DECADRON) 4 MG tablet Take 1 tab two times a day the day before Alimta chemo, then take 2 tabs once a day for 3 days starting the day after chemo. (Patient not taking: Reported on 07/05/2018) 30 tablet 1  . docusate sodium (COLACE) 100 MG capsule Take 1 capsule (100 mg total) by mouth daily as needed. 30 capsule 2  . hydrocortisone 1 % lotion Apply 1 application topically 2 (two) times daily. Apply to areas of radiation dermatitis. 118 mL 0  . ibuprofen (ADVIL,MOTRIN) 200 MG tablet Take 200 mg by mouth every 6 (six) hours as needed.    . lidocaine-prilocaine (EMLA) cream Apply to affected area once 30 g 3  . ondansetron (ZOFRAN) 8 MG tablet Take 1 tablet (8 mg total) by mouth 2 (two) times daily as needed (Nausea or vomiting). Start if needed on the third  day after chemotherapy. (Patient not taking: Reported on 07/16/2018) 30 tablet 1  . oxyCODONE (OXYCONTIN) 10 mg 12 hr tablet Take 1 tablet (10 mg total) by mouth every 12 (twelve) hours. 28 tablet 0  . Oxycodone HCl 10 MG TABS Take 1 tablet (10 mg total) by mouth every 4 (four) hours as needed. 60 tablet 0  . polyethylene glycol (MIRALAX / GLYCOLAX) packet Take 17 g by mouth daily.    . sucralfate (CARAFATE) 1 g tablet Take 1 tablet (1 g total) by mouth 3 (three) times daily before meals. Dissolve in warM water, swish and  swallow 90 tablet 4   No current facility-administered medications for this visit.     VITAL SIGNS: There were no vitals taken for this visit. There were no vitals filed for this visit.  Estimated body mass index is 24.59 kg/m as calculated from the following:   Height as of 06/14/18: 5\' 7"  (1.702 m).   Weight as of 07/12/18: 157 lb (71.2 kg).  LABS: CBC:    Component Value Date/Time   WBC 5.1 07/16/2018 0931   HGB 12.4 (L) 07/16/2018 0931   HGB 14.9 04/06/2014 0826   HCT 38.9 (L) 07/16/2018 0931   HCT 44.8 04/06/2014 0826   PLT 195 07/16/2018 0931   PLT 238 04/06/2014 0826   MCV 85.9 07/16/2018 0931   MCV 95 04/06/2014 0826   NEUTROABS 4.1 07/16/2018 0931   NEUTROABS 3.9 04/06/2014 0826   LYMPHSABS 0.5 (L) 07/16/2018 0931   LYMPHSABS 1.1 04/06/2014 0826   MONOABS 0.3 07/16/2018 0931   MONOABS 0.7 04/06/2014 0826   EOSABS 0.2 07/16/2018 0931   EOSABS 0.1 04/06/2014 0826   BASOSABS 0.0 07/16/2018 0931   BASOSABS 0.1 04/06/2014 0826   Comprehensive Metabolic Panel:    Component Value Date/Time   NA 138 07/16/2018 0931   NA 140 04/06/2014 0826   K 3.9 07/16/2018 0931   K 4.0 04/06/2014 0826   CL 108 07/16/2018 0931   CL 108 (H) 04/06/2014 0826   CO2 22 07/16/2018 0931   CO2 25 04/06/2014 0826   BUN 10 07/16/2018 0931   BUN 12 04/06/2014 0826   CREATININE 0.78 07/16/2018 0931   CREATININE 1.17 04/06/2014 0826   GLUCOSE 152 (H) 07/16/2018 0931   GLUCOSE 106 (H) 04/06/2014 0826   CALCIUM 8.4 (L) 07/16/2018 0931   CALCIUM 8.9 04/06/2014 0826   AST 18 07/16/2018 0931   AST 24 04/06/2014 0826   ALT 19 07/16/2018 0931   ALT 33 04/06/2014 0826   ALKPHOS 62 07/16/2018 0931   ALKPHOS 85 04/06/2014 0826   BILITOT 1.2 07/16/2018 0931   BILITOT 0.8 04/06/2014 0826   PROT 6.9 07/16/2018 0931   PROT 7.3 04/06/2014 0826   ALBUMIN 3.6 07/16/2018 0931   ALBUMIN 3.8 04/06/2014 0826    RADIOGRAPHIC STUDIES: Dg Chest 2 View  Result Date: 07/07/2018 CLINICAL DATA:   Fever, cough. EXAM: CHEST - 2 VIEW COMPARISON:  Radiographs and CT scan of June 14, 2018. FINDINGS: The heart size and mediastinal contours are within normal limits. No pneumothorax is noted. Stable right lower lobe mass is noted with small pleural effusion. Right internal jugular Port-A-Cath is unchanged in position. Stable right upper lobe nodules are noted concerning for metastatic disease. The visualized skeletal structures are unremarkable. IMPRESSION: Stable right lower lobe mass is noted with small pleural effusion. Stable right upper lobe nodules concerning for metastatic disease. Electronically Signed   By: Marijo Conception, M.D.   On:  07/07/2018 16:02   Dg Cervical Spine Complete  Result Date: 06/24/2018 CLINICAL DATA:  Left-sided neck pain without injury. EXAM: CERVICAL SPINE - COMPLETE 4+ VIEW COMPARISON:  None. FINDINGS: No fracture or spondylolisthesis is noted. Moderate degenerative disease is noted at C4-5, C5-6 and C6-7 with anterior osteophyte formation. No prevertebral soft tissue swelling is noted. Moderate bilateral neural foraminal stenosis is noted at C5-6 and C6-7 secondary to uncovertebral spurring. IMPRESSION: Multilevel degenerative disc disease. Bilateral neural foraminal stenosis secondary to uncovertebral spurring as described above. No acute abnormality seen in the cervical spine. Electronically Signed   By: Marijo Conception, M.D.   On: 06/24/2018 14:28    PERFORMANCE STATUS (ECOG) : 1 - Symptomatic but completely ambulatory  Review of Systems As noted above. Otherwise, a complete review of systems is negative.  Physical Exam General: NAD, frail appearing, thin Cardiovascular: regular rate and rhythm Pulmonary: clear ant fields Abdomen: soft, nontender, + bowel sounds GU: no suprapubic tenderness Extremities: no edema Skin: no rashes Neurological: Weakness but otherwise nonfocal  IMPRESSION: Follow-up visit today in the clinic.  Patient says that his body aches  improved after last visit. However, he continues to have chronic pain back pain, which is impacting his quality of life and work.   Patient was started on OxyContin and appears to be tolerating this well. However, he has continued to require frequent use of the oxy IR for BTP (up to 8 tablets per day). Will increase dose of OxyContin to try to lessen need for frequent use of oxycodone IR. Will also increase ibuprofen to 400mg  q6H. Patient continues to take acetaminophen 500mg  q6H.   He is pending workup for kyph[facility] at [facility]. I am hopeful that his opioid requirements would decrease following this procedure.   [place] CSRS reviewed. Case discussed with Dr. Mike Gip.    PLAN: 1. Refill oxycodone today, 10mg  # 60, no refills 2. Increase OxyContin 30mg  Q12, #14 3. Prophylactic bowel regimen 4.  Will need to readdress ACP during a future visit 5.  RTC in 1 weeks.  Will try to coordinate next visit at time of appointment with Dr. Mike Gip.  Patient expressed understanding and was in agreement with this plan. He also understands that He can call clinic at any time with any questions, concerns, or complaints.   Time Total: 40 minutes  Visit consisted of counseling and education dealing with the complex and emotionally intense issues of symptom management and palliative care in the setting of serious and potentially life-threatening illness.Greater than 50%  of this time was spent counseling and coordinating care related to the above assessment and plan.  Signed by: [name], PhD, DNP, NP-C, [facility] [place] [phone] (Work Cell)

## 2018-07-16 NOTE — Progress Notes (Signed)
[facility] day:  07/16/2018  Chief Complaint: Jimmy West is a 67 y.o. male with metastatic adenocarcinoma of the lung who is seen for assessment prior to cycle #3 pembrolizumab.  HPI:  The patient was last seen in the medical oncology clinic on 07/12/2018.  At that time, he continued to have back pain.  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  He has radiation dermatitis.  Pembrolizumab was held secondary to bilirubin 1.8.  During the interim, he notes some trouble with swallowing.  He is using Carafate.  He is using 8 short acting pain pills (oxycodone 10 mg po q 4 hours prn) and Oxycontin 10 mg po q 12 hours.  The rash on his back is fading post radiation.  He denies any increased shortness of breath or cough.   Past Medical History:  Diagnosis Date  . Bulging lumbar disc   . Cancer ([place])   . Heart attack [facility])     Past Surgical History:  Procedure Laterality Date  . CARDIAC CATHETERIZATION     two stents  . KNEE SURGERY Left   . PORTA CATH INSERTION N/A 05/21/2018   Procedure: PORTA CATH INSERTION;  Surgeon: Algernon Huxley, MD;  Location: [place] [facility];  Service: Cardiovascular;  Laterality: N/A;    Family History  Problem Relation Age of Onset  . Heart failure Father   . Cancer Maternal Aunt   . Heart failure Maternal Uncle   . Dementia Paternal Grandmother   . Cancer Maternal Aunt     Social History:  reports that he quit smoking about 14 years ago. His smoking use included cigarettes. He has a 22.50 pack-year smoking history. He has never used smokeless tobacco. He reports current alcohol use of about 15.0 standard drinks of alcohol per week. He reports that he does not use drugs. He started smoking at age 75.   He is smoking 1 1/2 packs/day.  Patient is employed as as full time Probation officer. Patient denies known exposures to radiation on toxins. The patient is alone today.  Allergies: No  Known Allergies  Current Medications: Current Outpatient Medications  Medication Sig Dispense Refill  . acetaminophen (TYLENOL) 500 MG tablet Take 500 mg by mouth 3 (three) times daily as needed.    Marland Kitchen aspirin EC 81 MG tablet Take 81 mg by mouth daily.    Marland Kitchen docusate sodium (COLACE) 100 MG capsule Take 1 capsule (100 mg total) by mouth daily as needed. 30 capsule 2  . hydrocortisone 1 % lotion Apply 1 application topically 2 (two) times daily. Apply to areas of radiation dermatitis. 118 mL 0  . ibuprofen (ADVIL,MOTRIN) 200 MG tablet Take 200 mg by mouth every 6 (six) hours as needed.    . lidocaine-prilocaine (EMLA) cream Apply to affected area once 30 g 3  . sucralfate (CARAFATE) 1 g tablet Take 1 tablet (1 g total) by mouth 3 (three) times daily before meals. Dissolve in warM water, swish and swallow 90 tablet 4  . dexamethasone (DECADRON) 4 MG tablet Take 1 tab two times a day the day before Alimta chemo, then take 2 tabs once a day for 3 days starting the day after chemo. (Patient not taking: Reported on 07/05/2018) 30 tablet 1  . morphine (MS CONTIN) 30 MG 12 hr tablet Take 1 tablet (30 mg total) by mouth every 12 (twelve) hours. 60 tablet 0  . naloxone (NARCAN) nasal spray 4 mg/0.1  mL 1 spray by nose in the event of opioid overdose. Call EMS (911) immediately if medication is used. 1 kit 0  . ondansetron (ZOFRAN) 8 MG tablet Take 1 tablet (8 mg total) by mouth 2 (two) times daily as needed (Nausea or vomiting). Start if needed on the third day after chemotherapy. (Patient not taking: Reported on 07/16/2018) 30 tablet 1  . Oxycodone HCl 10 MG TABS Take 1 tablet (10 mg total) by mouth every 4 (four) hours as needed. 60 tablet 0  . polyethylene glycol (MIRALAX / GLYCOLAX) packet Take 17 g by mouth daily.     No current facility-administered medications for this visit.     Review of Systems  Constitutional: Negative.  Negative for chills, diaphoresis, fever, malaise/fatigue and weight loss (no  new weight).  HENT: Positive for sore throat (trouble with swallowing- using Carafate). Negative for congestion, ear discharge, ear pain, nosebleeds and sinus pain.   Eyes: Negative.  Negative for blurred vision, double vision, photophobia, pain, discharge and redness.  Respiratory: Negative for cough, hemoptysis, sputum production, shortness of breath and wheezing.   Cardiovascular: Negative.  Negative for chest pain, palpitations, orthopnea, leg swelling and PND.  Gastrointestinal: Negative.  Negative for abdominal pain, blood in stool, constipation, diarrhea, heartburn, melena, nausea and vomiting.       Dysphagia; taking Carafate.  Genitourinary: Negative.  Negative for dysuria, frequency, hematuria and urgency.  Musculoskeletal: Positive for back pain (T4 bone metastasis). Negative for falls, joint pain and myalgias. Neck pain: improved.  Skin: Negative.  Negative for itching and rash.  Neurological: Positive for sensory change (chronic numbness to LEFT heel 2/2 spinal disk issues). Negative for dizziness, tingling, tremors, focal weakness, weakness and headaches.  Endo/Heme/Allergies: Negative.  Does not bruise/bleed easily.  Psychiatric/Behavioral: Negative for depression and memory loss. The patient is not nervous/anxious.   All other systems reviewed and are negative.  Performance status (ECOG): 1  Vital Signs BP (!) 142/83   Pulse 78   Temp 97.9 F (36.6 C) (Tympanic)   Resp 20   Physical Exam  Constitutional: He is oriented to person, place, and time and well-developed, well-nourished, and in no distress. No distress.  HENT:  Head: Normocephalic.  Mouth/Throat: Oropharynx is clear and moist and mucous membranes are normal. He has dentures. No oropharyngeal exudate.  Gray hair.  Mustache.  Eyes: Pupils are equal, round, and reactive to light. Conjunctivae and EOM are normal. No scleral icterus.  Glasses.  Blue eyes.  Neck: Normal range of motion. Neck supple.   Cardiovascular: Normal rate, regular rhythm, normal heart sounds and intact distal pulses. Exam reveals no gallop and no friction rub.  No murmur heard. Pulmonary/Chest: Effort normal and breath sounds normal. No respiratory distress. He has no wheezes. He has no rales.  Abdominal: Soft. Bowel sounds are normal. He exhibits no distension and no mass. There is no abdominal tenderness. There is no rebound and no guarding.  Musculoskeletal: Normal range of motion.        General: No tenderness, deformity or edema.     Thoracic back: He exhibits pain.  Lymphadenopathy:    He has no cervical adenopathy.    He has no axillary adenopathy.       Right: No supraclavicular adenopathy present.       Left: No supraclavicular adenopathy present.  Neurological: He is alert and oriented to person, place, and time. Gait normal.  Skin: Skin is warm and dry. No rash noted. He is not diaphoretic. No  erythema. No pallor.  Erythema at T4 secondary to radiation, fading.  Psychiatric: Mood, affect and judgment normal.  Nursing note and vitals reviewed.   Infusion on 07/16/2018  Component Date Value Ref Range Status  . Sodium 07/16/2018 138  135 - 145 mmol/L Final  . Potassium 07/16/2018 3.9  3.5 - 5.1 mmol/L Final  . Chloride 07/16/2018 108  98 - 111 mmol/L Final  . CO2 07/16/2018 22  22 - 32 mmol/L Final  . Glucose, Bld 07/16/2018 152* 70 - 99 mg/dL Final  . BUN 07/16/2018 10  8 - 23 mg/dL Final  . Creatinine, Ser 07/16/2018 0.78  0.61 - 1.24 mg/dL Final  . Calcium 07/16/2018 8.4* 8.9 - 10.3 mg/dL Final  . Total Protein 07/16/2018 6.9  6.5 - 8.1 g/dL Final  . Albumin 07/16/2018 3.6  3.5 - 5.0 g/dL Final  . AST 07/16/2018 18  15 - 41 U/L Final  . ALT 07/16/2018 19  0 - 44 U/L Final  . Alkaline Phosphatase 07/16/2018 62  38 - 126 U/L Final  . Total Bilirubin 07/16/2018 1.2  0.3 - 1.2 mg/dL Final  . GFR calc non Af Amer 07/16/2018 >60  >60 mL/min Final  . GFR calc Af Amer 07/16/2018 >60  >60 mL/min  Final  . Anion gap 07/16/2018 8  5 - 15 Final   Performed at [facility], [address], [place], [place] [address]  . WBC 07/16/2018 5.1  4.0 - 10.5 K/uL Final  . RBC 07/16/2018 4.53  4.22 - 5.81 MIL/uL Final  . Hemoglobin 07/16/2018 12.4* 13.0 - 17.0 g/dL Final  . HCT 07/16/2018 38.9* 39.0 - 52.0 % Final  . MCV 07/16/2018 85.9  80.0 - 100.0 fL Final  . MCH 07/16/2018 27.4  26.0 - 34.0 pg Final  . MCHC 07/16/2018 31.9  30.0 - 36.0 g/dL Final  . RDW 07/16/2018 14.0  11.5 - 15.5 % Final  . Platelets 07/16/2018 195  150 - 400 K/uL Final  . nRBC 07/16/2018 0.0  0.0 - 0.2 % Final  . Neutrophils Relative % 07/16/2018 80  % Final  . Neutro Abs 07/16/2018 4.1  1.7 - 7.7 K/uL Final  . Lymphocytes Relative 07/16/2018 9  % Final  . Lymphs Abs 07/16/2018 0.5* 0.7 - 4.0 K/uL Final  . Monocytes Relative 07/16/2018 6  % Final  . Monocytes Absolute 07/16/2018 0.3  0.1 - 1.0 K/uL Final  . Eosinophils Relative 07/16/2018 4  % Final  . Eosinophils Absolute 07/16/2018 0.2  0.0 - 0.5 K/uL Final  . Basophils Relative 07/16/2018 1  % Final  . Basophils Absolute 07/16/2018 0.0  0.0 - 0.1 K/uL Final  . Immature Granulocytes 07/16/2018 0  % Final  . Abs Immature Granulocytes 07/16/2018 0.01  0.00 - 0.07 K/uL Final   Performed at [facility], [address] [place], [place] [address]  . Magnesium 07/16/2018 2.3  1.7 - 2.4 mg/dL Final   Performed at [facility], [address], [place], [place] [address]  . TSH 07/16/2018 0.382  0.350 - 4.500 uIU/mL Final   Comment: Performed by a 3rd Generation assay with a functional sensitivity of <=0.01 uIU/mL. Performed at [facility], [address] [place], [place] [address]     Assessment:  Jimmy West is a 67 y.o. 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  Pathology revealed an invasive high-grade adenocarcinoma, with predominantly solid growth  pattern.  The neoplastic cells were TTF-1 (+), Napsin A (+), and P40 (+).  He has a T4 vertebral metastasis.  Clinical stage is T4N1M1.  There was not enough material for Foundation One testing.  PDL-1 revealed TPS 90%.  Chest CT angiogram on 04/27/2018 revealed no evidence of significant pulmonary embolus.  There was a 4.1 cm mass in the superior segment right lower lobe posteriorly causing obliteration of the in coming bronchus and mild postobstructive change. There were multiple additional bilateral pulmonary nodules (< 1 cm - 1.4 cm).   There were enlarged right hilar lymph nodes.  There was metastasis to the T4 vertebra with mild associated compression.  There was emphysematous changes in the lungs.   PET scan on 04/30/2018 revealed a 3.4 cm hypermetabolic RLL pulmonary mass (SUV 13.2), 11 mm pulmonary nodule in the LEFT lung (SUV 4.5), RIGHT paratracheal lymph node (SUV 5.1), and hypermetabolic activity within the T4 vertebral body (SUV 10.2).   Thoracic spine MRI on 05/03/2018 revealed T4 metastasis with a 40% pathologic compression deformity and 5 mm of retropulsion of the vertebral body.  Retropulsion results in mild spinal canal stenosis and mild bilateral C4-5 foraminal stenosis.  There was paravertebral soft tissue thickening from mid T3 to mid T5, likely representing edema related to the pathologic compression deformity vs. possible extraosseous extension of the neoplasm.  There were no additional thoracic spinal metastases noted.   Head MRI on 05/03/2018 revealed no intracranial metastatic disease.  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  Incidental mention made of mild paranasal sinus disease.  He is s/p cycle #2 pembrolizumab (05/24/2018 - 06/14/2018).  He is tolerating treatment well.  He completed T4 radiation  on 07/07/2018.  He receives Xgeva monthly (06/03/2018 - 07/05/2018).  Symptomatically, he is doing well.  He continues to have back pain and is using oxycodone short acting frequently.  He is using Carafate for radiation esophagitis.  Bilirubin is 1.2.  Plan: 1. Labs today:  CBC with diff, CMP. 2.   Metastatic high-grade adenocarcinoma of the RIGHT lung: Patient s/p 2 cycles of pembrolizumab. Radiation to T4 complete. Pembrolizumab postponed secondary to increased bilirubin (? secondary to Gilbert's) on 07/12/2018. Bilirubin normal today, plan cycle #3 pembrolizumab today. Discuss next cycle.  Patient wishes to receive on 08/09/2018 in [place] (rather than 08/06/2018). 3.   T4 osseous metastases:  Patient completed radiation.   Patient scheduled for [facility] consultation on 07/19/2018.  Continue monthly Xgeva (last 07/05/2018; next due 08/02/2018).  Continue calcium 1200 mg and vitamin D 800 IU supplementation. 4.   Pain and symptom management: T4 pain poorly controlled. Patient on Carafate for radiation esophagitis. Hydrocortisone 1% lotion BID for radiation dermatitis (improved). Discuss increasing long acting pain medications (Oxycontin).  Patient to be seen today by Billey Chang, NP, from palliative care medicine for pain medication adjustment. 5.  RTC on 08/09/2018 in [place] for MD assessment, labs (CBC with diff, CMP, TSH), and cycle # 4 pembrolizumab.   Lequita Asal, MD  07/16/2018, 4:35 PM

## 2018-07-19 DIAGNOSIS — C7951 Secondary malignant neoplasm of bone: Secondary | ICD-10-CM | POA: Diagnosis not present

## 2018-07-23 ENCOUNTER — Inpatient Hospital Stay: Payer: Medicare Other | Attending: Hospice and Palliative Medicine | Admitting: Hospice and Palliative Medicine

## 2018-07-23 DIAGNOSIS — Z515 Encounter for palliative care: Secondary | ICD-10-CM | POA: Insufficient documentation

## 2018-07-23 DIAGNOSIS — Z79899 Other long term (current) drug therapy: Secondary | ICD-10-CM | POA: Insufficient documentation

## 2018-07-23 DIAGNOSIS — C3431 Malignant neoplasm of lower lobe, right bronchus or lung: Secondary | ICD-10-CM | POA: Insufficient documentation

## 2018-07-23 DIAGNOSIS — G893 Neoplasm related pain (acute) (chronic): Secondary | ICD-10-CM | POA: Insufficient documentation

## 2018-07-23 DIAGNOSIS — C7951 Secondary malignant neoplasm of bone: Secondary | ICD-10-CM | POA: Insufficient documentation

## 2018-07-23 DIAGNOSIS — Z7982 Long term (current) use of aspirin: Secondary | ICD-10-CM | POA: Insufficient documentation

## 2018-07-23 DIAGNOSIS — Z9221 Personal history of antineoplastic chemotherapy: Secondary | ICD-10-CM | POA: Insufficient documentation

## 2018-07-23 NOTE — Progress Notes (Signed)
Patient was scheduled to see me today but did not come. I called him and he forgot about the appointment. He says that the pain is stable. He does think the increased dose of the OxyContin to 30mg  helped but that it was about $150 and he cannot afford that long term. Would recommend switching to MS Contin, which should be a more affordable long acting opioid option.   Patient plans to switch to follow his primary oncology team in [place]. We discussed f/u with me as needed.

## 2018-07-26 ENCOUNTER — Ambulatory Visit: Payer: Medicare Other

## 2018-07-26 ENCOUNTER — Other Ambulatory Visit: Payer: Medicare Other

## 2018-07-26 ENCOUNTER — Ambulatory Visit: Payer: Medicare Other | Admitting: Hematology and Oncology

## 2018-07-27 ENCOUNTER — Other Ambulatory Visit: Payer: Self-pay | Admitting: Hospice and Palliative Medicine

## 2018-07-27 MED ORDER — MORPHINE SULFATE ER 30 MG PO TBCR: 30.0000 mg | EXTENDED_RELEASE_TABLET | Freq: Two times a day (BID) | ORAL | 0 refills | Status: DC

## 2018-07-27 MED ORDER — OXYCODONE HCL 10 MG PO TABS: 10.0000 mg | ORAL_TABLET | ORAL | 0 refills | Status: DC | PRN

## 2018-07-27 MED ORDER — NALOXONE HCL 4 MG/0.1ML NA LIQD: NASAL | 0 refills | Status: DC

## 2018-07-27 NOTE — Progress Notes (Signed)
Refill request received for oxycodone. Last refilled on 07/16/18 #60. Patient averaging 5 tablets per day. He says pain has been relatively stable. No adverse issues reported. We discussed the importance of controlling his pain with LAOP with hope that he would only require oxycodone for BTP. However, patient says he cannot afford OxyContin and requests a different long acting opioid. Will rotate to MS Contin, which hopefully should be more affordable.   Total MME 90 of OxyContin and 75 of oxycodone IR for total daily MME of 135mg.  [place] CSRS reviewed.   Plan: DC OxyContin Switch to MS Contin 30mg Q12H with plan to increase to Q8H if needed Continue oxycodone 10mg Q3-4H prn for BTP. Rx for #60, no refill Naloxone nasal spray kit Plan random UDS   

## 2018-08-02 DIAGNOSIS — C7951 Secondary malignant neoplasm of bone: Secondary | ICD-10-CM | POA: Diagnosis not present

## 2018-08-05 ENCOUNTER — Ambulatory Visit: Payer: Medicare Other | Admitting: Hematology and Oncology

## 2018-08-05 ENCOUNTER — Other Ambulatory Visit: Payer: Medicare Other

## 2018-08-05 ENCOUNTER — Ambulatory Visit: Payer: Medicare Other

## 2018-08-05 ENCOUNTER — Telehealth: Payer: Self-pay

## 2018-08-05 NOTE — Telephone Encounter (Signed)
Left VM to remind patient of upcoming appt. In [place].

## 2018-08-08 NOTE — Progress Notes (Addendum)
[facility] day:  08/09/2018   Chief Complaint: Jimmy West is a 68 y.o. male with metastatic adenocarcinoma of the lung who is seen for assessment prior to cycle #4 pembrolizumab.  HPI:  The patient was last seen in the medical oncology clinic on 07/16/2018.  At that time, he continued to have back pain and was using oxycodone short acting frequently.  He was using Carafate for radiation esophagitis.  Bilirubin was 1.2. He received cycle #3 pembrolizumab.  He saw Billey Chang, NP on 07/16/2018.  Notes reviewed.  Oxycontin was increased to 30 mg BID, however insurance would not cover. Change in therapy to  MS-Contin 30 mg BID. He continued oxycodone 10 mg q 4 hours PRN pain. On average, patient requiring about 4 short acting tablets daily.  He saw neurosurgery at [facility] (Dr. Cheryll Cockayne and Towanda Malkin, [place]) on 07/19/2018 and 08/02/2018 regarding his back pain.  Notes reviewed.  Pain had improved on 08/02/2018; he described intermittent radicular upper back pain.  CT was to be performed that day to assess quality of bone and follow-up CT in 3 months.  Decision was made postpone kyphoplasty at that time. Next CT scan is scheduled for 08/16/2018 at [facility].   During the interim, patient tolerating treatment well. His only real complaint is post treatment fatigue. He denies any nausea, vomiting, or changes to his bowel habits. Patient notes that his pain is well controlled on the prescribed interventions. Patient denies that he has experienced any B symptoms. He denies any interval infections.   Patient advises that he maintains an adequate appetite. He is eating well. Weight today is 156 lb 1.4 oz (70.8 kg), which compared to his last visit to the clinic, represents a 1 pound decrease.   Patient denies pain in the clinic today.   Past Medical History:  Diagnosis Date  . Bulging lumbar disc   . Cancer ([place])   . Heart attack [facility])     Past  Surgical History:  Procedure Laterality Date  . CARDIAC CATHETERIZATION     two stents  . KNEE SURGERY Left   . PORTA CATH INSERTION N/A 05/21/2018   Procedure: PORTA CATH INSERTION;  Surgeon: Algernon Huxley, MD;  Location: [facility];  Service: Cardiovascular;  Laterality: N/A;    Family History  Problem Relation Age of Onset  . Heart failure Father   . Cancer Maternal Aunt   . Heart failure Maternal Uncle   . Dementia Paternal Grandmother   . Cancer Maternal Aunt     Social History:  reports that he quit smoking about 14 years ago. His smoking use included cigarettes. He has a 22.50 pack-year smoking history. He has never used smokeless tobacco. He reports current alcohol use of about 15.0 standard drinks of alcohol per week. He reports that he does not use drugs. He started smoking at age 56.   He is smoking 1 1/2 packs/day.  Patient is employed as as full time Probation officer. Patient denies known exposures to radiation on toxins. The patient is accompanied by a woman today.  Allergies: No Known Allergies  Current Medications: Current Outpatient Medications  Medication Sig Dispense Refill  . acetaminophen (TYLENOL) 500 MG tablet Take 500 mg by mouth 3 (three) times daily as needed.    Marland Kitchen aspirin EC 81 MG tablet Take 81 mg by mouth daily.    . Calcium 600-400 MG-UNIT CHEW Chew 2 tablets by mouth daily.    Marland Kitchen  ibuprofen (ADVIL,MOTRIN) 200 MG tablet Take 200 mg by mouth every 6 (six) hours as needed.    . lidocaine-prilocaine (EMLA) cream Apply to affected area once 30 g 3  . morphine (MS CONTIN) 30 MG 12 hr tablet Take 1 tablet (30 mg total) by mouth every 12 (twelve) hours. 60 tablet 0  . naloxone (NARCAN) nasal spray 4 mg/0.1 mL 1 spray by nose in the event of opioid overdose. Call EMS (911) immediately if medication is used. 1 kit 0  . Oxycodone HCl 10 MG TABS Take 1 tablet (10 mg total) by mouth every 4 (four) hours as needed. 60 tablet 0  . polyethylene glycol (MIRALAX /  GLYCOLAX) packet Take 17 g by mouth daily.    Marland Kitchen senna (SENOKOT) 8.6 MG tablet Take 1 tablet by mouth as needed.     Marland Kitchen dexamethasone (DECADRON) 4 MG tablet Take 1 tab two times a day the day before [place] chemo, then take 2 tabs once a day for 3 days starting the day after chemo. (Patient not taking: Reported on 07/05/2018) 30 tablet 1  . ondansetron (ZOFRAN) 8 MG tablet Take 1 tablet (8 mg total) by mouth 2 (two) times daily as needed (Nausea or vomiting). Start if needed on the third day after chemotherapy. (Patient not taking: Reported on 07/16/2018) 30 tablet 1   No current facility-administered medications for this visit.    Facility-Administered Medications Ordered in Other Visits  Medication Dose Route Frequency Provider Last Rate Last Dose  . heparin lock flush 100 unit/mL  500 Units Intravenous Once Corcoran, Melissa C, MD      . sodium chloride flush (NS) 0.9 % injection 10 mL  10 mL Intravenous PRN Lequita Asal, MD   10 mL at 08/09/18 0907    Review of Systems  Constitutional: Positive for weight loss (1 pound). Negative for chills, diaphoresis, fever and malaise/fatigue (post treatment).  HENT: Negative for congestion, ear discharge, ear pain, nosebleeds, sinus pain and sore throat.   Eyes: Negative.  Negative for blurred vision, double vision, photophobia, pain, discharge and redness.  Respiratory: Negative.  Negative for cough, hemoptysis, sputum production, shortness of breath and wheezing.   Cardiovascular: Negative.  Negative for chest pain, palpitations, orthopnea, leg swelling and PND.  Gastrointestinal: Negative.  Negative for abdominal pain, blood in stool, constipation, diarrhea, melena and vomiting.       Dysphagia; taking Carafate.  Genitourinary: Negative.  Negative for dysuria, frequency, hematuria and urgency.  Musculoskeletal: Positive for back pain (T4 bone metastasis) and neck pain (improved). Negative for falls, joint pain and myalgias.  Skin: Negative.   Negative for itching and rash.  Neurological: Positive for sensory change (chronic numbness to LEFT heel 2/2 spinal disk issues). Negative for dizziness, tingling, tremors, speech change, focal weakness, seizures and weakness.  Endo/Heme/Allergies: Negative.  Does not bruise/bleed easily.  Psychiatric/Behavioral: Negative for depression and memory loss. The patient is not nervous/anxious and does not have insomnia.   All other systems reviewed and are negative.  Performance status (ECOG): 1 Vital Signs BP 134/86 (BP Location: Left Arm, Patient Position: Sitting)   Pulse 67   Temp (!) 97.1 F (36.2 C) (Tympanic)   Resp 18   Wt 156 lb 1.4 oz (70.8 kg)   BMI 24.45 kg/m   Physical Exam  Constitutional: He is oriented to person, place, and time and well-developed, well-nourished, and in no distress. No distress.  HENT:  Head: Normocephalic and atraumatic.  Mouth/Throat: Oropharynx is clear and moist  and mucous membranes are normal. He has dentures. No oropharyngeal exudate.  Gray hair.  Mustache.  Eyes: Pupils are equal, round, and reactive to light. Conjunctivae and EOM are normal. No scleral icterus.  Glasses.  Blue eyes.  Neck: Normal range of motion. Neck supple. No JVD present.  Cardiovascular: Normal rate, regular rhythm, normal heart sounds and intact distal pulses. Exam reveals no gallop and no friction rub.  No murmur heard. Pulmonary/Chest: Effort normal and breath sounds normal. No respiratory distress. He has no wheezes. He has no rales.  Abdominal: Soft. Bowel sounds are normal. He exhibits no distension and no mass. There is no abdominal tenderness. There is no rebound and no guarding.  Musculoskeletal: Normal range of motion.        General: No tenderness, deformity or edema.  Lymphadenopathy:    He has no cervical adenopathy.    He has no axillary adenopathy.       Right: No supraclavicular adenopathy present.       Left: No supraclavicular adenopathy present.   Neurological: He is alert and oriented to person, place, and time. Gait normal.  Skin: Skin is warm and dry. No rash noted. He is not diaphoretic. No erythema. No pallor.  Mild hyperpigmentation at T4 secondary s/p radiation.  Psychiatric: Mood, affect and judgment normal.  Nursing note and vitals reviewed.   Infusion on 08/09/2018  Component Date Value Ref Range Status  . Sodium 08/09/2018 136  135 - 145 mmol/L Final  . Potassium 08/09/2018 3.7  3.5 - 5.1 mmol/L Final  . Chloride 08/09/2018 103  98 - 111 mmol/L Final  . CO2 08/09/2018 24  22 - 32 mmol/L Final  . Glucose, Bld 08/09/2018 112* 70 - 99 mg/dL Final  . BUN 08/09/2018 10  8 - 23 mg/dL Final  . Creatinine, Ser 08/09/2018 0.82  0.61 - 1.24 mg/dL Final  . Calcium 08/09/2018 9.0  8.9 - 10.3 mg/dL Final  . Total Protein 08/09/2018 6.8  6.5 - 8.1 g/dL Final  . Albumin 08/09/2018 3.6  3.5 - 5.0 g/dL Final  . AST 08/09/2018 20  15 - 41 U/L Final  . ALT 08/09/2018 17  0 - 44 U/L Final  . Alkaline Phosphatase 08/09/2018 69  38 - 126 U/L Final  . Total Bilirubin 08/09/2018 0.7  0.3 - 1.2 mg/dL Final  . GFR calc non Af Amer 08/09/2018 >60  >60 mL/min Final  . GFR calc Af Amer 08/09/2018 >60  >60 mL/min Final  . Anion gap 08/09/2018 9  5 - 15 Final   Performed at [facility], [address], [place], [place] [address]  . WBC 08/09/2018 5.7  4.0 - 10.5 K/uL Final  . RBC 08/09/2018 4.34  4.22 - 5.81 MIL/uL Final  . Hemoglobin 08/09/2018 12.3* 13.0 - 17.0 g/dL Final  . HCT 08/09/2018 37.0* 39.0 - 52.0 % Final  . MCV 08/09/2018 85.3  80.0 - 100.0 fL Final  . MCH 08/09/2018 28.3  26.0 - 34.0 pg Final  . MCHC 08/09/2018 33.2  30.0 - 36.0 g/dL Final  . RDW 08/09/2018 14.0  11.5 - 15.5 % Final  . Platelets 08/09/2018 214  150 - 400 K/uL Final  . nRBC 08/09/2018 0.0  0.0 - 0.2 % Final  . Neutrophils Relative % 08/09/2018 64  % Final  . Neutro Abs 08/09/2018 3.7  1.7 - 7.7 K/uL Final  . Lymphocytes Relative 08/09/2018 12  %  Final  . Lymphs Abs 08/09/2018 0.7  0.7 - 4.0 K/uL Final  . Monocytes Relative 08/09/2018 13  % Final  . Monocytes Absolute 08/09/2018 0.8  0.1 - 1.0 K/uL Final  . Eosinophils Relative 08/09/2018 10  % Final  . Eosinophils Absolute 08/09/2018 0.6* 0.0 - 0.5 K/uL Final  . Basophils Relative 08/09/2018 1  % Final  . Basophils Absolute 08/09/2018 0.0  0.0 - 0.1 K/uL Final  . Immature Granulocytes 08/09/2018 0  % Final  . Abs Immature Granulocytes 08/09/2018 0.02  0.00 - 0.07 K/uL Final   Performed at [facility], [address], [place], [place] [address]  . Magnesium 08/09/2018 2.0  1.7 - 2.4 mg/dL Final   Performed at [facility], [address], [place], [place] [address]    Assessment:  Jimmy West is a 68 y.o. 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  Pathology revealed an invasive high-grade adenocarcinoma, with predominantly solid growth pattern.  The neoplastic cells were TTF-1 (+), Napsin A (+), and P40 (+).  He has a T4 vertebral metastasis.  Clinical stage is T4N1M1.  There was not enough material for Foundation One testing.  PDL-1 revealed TPS 90%.  Chest CT angiogram on 04/27/2018 revealed no evidence of significant pulmonary embolus.  There was a 4.1 cm mass in the superior segment right lower lobe posteriorly causing obliteration of the in coming bronchus and mild postobstructive change. There were multiple additional bilateral pulmonary nodules (< 1 cm - 1.4 cm).   There were enlarged right hilar lymph nodes.  There was metastasis to the T4 vertebra with mild associated compression.  There was emphysematous changes in the lungs.   PET scan on 04/30/2018 revealed a 3.4 cm hypermetabolic RLL pulmonary mass (SUV 13.2), 11 mm pulmonary nodule in the LEFT lung (SUV 4.5), RIGHT paratracheal lymph node (SUV 5.1), and hypermetabolic activity within the T4 vertebral body (SUV  10.2).   Thoracic spine MRI on 05/03/2018 revealed T4 metastasis with a 40% pathologic compression deformity and 5 mm of retropulsion of the vertebral body.  Retropulsion results in mild spinal canal stenosis and mild bilateral C4-5 foraminal stenosis.  There was paravertebral soft tissue thickening from mid T3 to mid T5, likely representing edema related to the pathologic compression deformity vs. possible extraosseous extension of the neoplasm.  There were no additional thoracic spinal metastases noted.   Head MRI on 05/03/2018 revealed no intracranial metastatic disease.  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  Incidental mention made of mild paranasal sinus disease.  He is s/p cycle #3 pembrolizumab (05/24/2018 - 07/16/2018).  He is tolerating treatment well.  He completed T4 radiation on 07/07/2018.  He receives Xgeva monthly (06/03/2018 - 07/05/2018).  Symptomatically, he is doing well.  He only notes fatigue.  Back pain is well controlled.  Exam is stable. Hemoglobin is 12.3.  LFTs are normal.  Plan: 1. Labs today:  CBC with diff, CMP, TSH. 2.   Metastatic high-grade adenocarcinoma of the RIGHT lung Patient is s/p 3 cycles of pembrolizumab. Patient tolerating treatments well with only fatigue noted. Radiation to T4 complete. Cycle #4 pembrolizumab today. Discuss plan for follow-up imaging. 3.   T4 osseous metastases  Patient completed radiation.   Review interval consultation with [facility] neurosurgery.  Continue monthly Xgeva (due today).  Continue calcium 1200 mg and vitamin D 800 IU daily. 4.   Pain and symptom management Pain medications being adjusted. Patient currently on MS-Contin 30 mg BID and oxycodone 10 mg q 4 hours PRN pain.  Patient seen by Billey Chang, NP, from palliative care medicine for pain  medication adjustment. 5.   Chest CT on 08/27/2018. 6.   RTC on 08/30/2018 in [facility] for MD assessment, labs (CBC with diff, CMP, TSH), and cycle #5 pembrolizumab.   Honor Loh, NP  08/09/2018, 10:48 AM   I saw and evaluated the patient, participating in the key portions of the service and reviewing pertinent diagnostic studies and records.  I reviewed the nurse practitioner's note and agree with the findings and the plan.  The assessment and plan were discussed with the patient.  Several questions were asked by the patient and answered.   Nolon Stalls, MD 08/09/2018,10:48 AM

## 2018-08-09 ENCOUNTER — Encounter: Payer: Self-pay | Admitting: Hematology and Oncology

## 2018-08-09 ENCOUNTER — Inpatient Hospital Stay (HOSPITAL_BASED_OUTPATIENT_CLINIC_OR_DEPARTMENT_OTHER): Payer: Medicare Other | Admitting: Hematology and Oncology

## 2018-08-09 ENCOUNTER — Inpatient Hospital Stay: Payer: Medicare Other | Attending: Hematology and Oncology

## 2018-08-09 ENCOUNTER — Encounter: Payer: Medicare Other | Admitting: Hospice and Palliative Medicine

## 2018-08-09 ENCOUNTER — Inpatient Hospital Stay: Payer: Medicare Other

## 2018-08-09 VITALS — BP 134/86 | HR 67 | Temp 97.1°F | Resp 18 | Wt 156.1 lb

## 2018-08-09 DIAGNOSIS — Z79899 Other long term (current) drug therapy: Secondary | ICD-10-CM

## 2018-08-09 DIAGNOSIS — Z7982 Long term (current) use of aspirin: Secondary | ICD-10-CM

## 2018-08-09 DIAGNOSIS — C3431 Malignant neoplasm of lower lobe, right bronchus or lung: Secondary | ICD-10-CM | POA: Diagnosis not present

## 2018-08-09 DIAGNOSIS — Z5112 Encounter for antineoplastic immunotherapy: Secondary | ICD-10-CM

## 2018-08-09 DIAGNOSIS — C7951 Secondary malignant neoplasm of bone: Secondary | ICD-10-CM

## 2018-08-09 DIAGNOSIS — G893 Neoplasm related pain (acute) (chronic): Secondary | ICD-10-CM

## 2018-08-09 DIAGNOSIS — Z87891 Personal history of nicotine dependence: Secondary | ICD-10-CM | POA: Diagnosis not present

## 2018-08-09 LAB — CBC WITH DIFFERENTIAL/PLATELET
Abs Immature Granulocytes: 0.02 10*3/uL (ref 0.00–0.07)
Basophils Absolute: 0 10*3/uL (ref 0.0–0.1)
Basophils Relative: 1 %
Eosinophils Absolute: 0.6 10*3/uL — ABNORMAL HIGH (ref 0.0–0.5)
Eosinophils Relative: 10 %
HCT: 37 % — ABNORMAL LOW (ref 39.0–52.0)
Hemoglobin: 12.3 g/dL — ABNORMAL LOW (ref 13.0–17.0)
Immature Granulocytes: 0 %
Lymphocytes Relative: 12 %
Lymphs Abs: 0.7 10*3/uL (ref 0.7–4.0)
MCH: 28.3 pg (ref 26.0–34.0)
MCHC: 33.2 g/dL (ref 30.0–36.0)
MCV: 85.3 fL (ref 80.0–100.0)
Monocytes Absolute: 0.8 10*3/uL (ref 0.1–1.0)
Monocytes Relative: 13 %
Neutro Abs: 3.7 10*3/uL (ref 1.7–7.7)
Neutrophils Relative %: 64 %
Platelets: 214 10*3/uL (ref 150–400)
RBC: 4.34 MIL/uL (ref 4.22–5.81)
RDW: 14 % (ref 11.5–15.5)
WBC: 5.7 10*3/uL (ref 4.0–10.5)
nRBC: 0 % (ref 0.0–0.2)

## 2018-08-09 LAB — COMPREHENSIVE METABOLIC PANEL
ALT: 17 U/L (ref 0–44)
AST: 20 U/L (ref 15–41)
Albumin: 3.6 g/dL (ref 3.5–5.0)
Alkaline Phosphatase: 69 U/L (ref 38–126)
Anion gap: 9 (ref 5–15)
BUN: 10 mg/dL (ref 8–23)
CO2: 24 mmol/L (ref 22–32)
Calcium: 9 mg/dL (ref 8.9–10.3)
Chloride: 103 mmol/L (ref 98–111)
Creatinine, Ser: 0.82 mg/dL (ref 0.61–1.24)
GFR calc Af Amer: >60 mL/min (ref >60)
GFR calc non Af Amer: >60 mL/min (ref >60)
Glucose, Bld: 112 mg/dL — ABNORMAL HIGH (ref 70–99)
Potassium: 3.7 mmol/L (ref 3.5–5.1)
Sodium: 136 mmol/L (ref 135–145)
Total Bilirubin: 0.7 mg/dL (ref 0.3–1.2)
Total Protein: 6.8 g/dL (ref 6.5–8.1)

## 2018-08-09 LAB — TSH: TSH: 1.199 u[IU]/mL (ref 0.350–4.500)

## 2018-08-09 LAB — MAGNESIUM: Magnesium: 2 mg/dL (ref 1.7–2.4)

## 2018-08-09 MED ORDER — SODIUM CHLORIDE 0.9 % IV SOLN
200.0000 mg | Freq: Once | INTRAVENOUS | Status: AC
  Administered 2018-08-09: 200 mg via INTRAVENOUS
  Filled 2018-08-09: qty 8

## 2018-08-09 MED ORDER — SODIUM CHLORIDE 0.9% FLUSH
10.0000 mL | INTRAVENOUS | Status: DC | PRN
  Administered 2018-08-09: 10 mL via INTRAVENOUS
  Filled 2018-08-09: qty 10

## 2018-08-09 MED ORDER — DENOSUMAB 120 MG/1.7ML ~~LOC~~ SOLN
120.0000 mg | Freq: Once | SUBCUTANEOUS | Status: AC
  Administered 2018-08-09: 120 mg via SUBCUTANEOUS
  Filled 2018-08-09: qty 1.7

## 2018-08-09 MED ORDER — SODIUM CHLORIDE 0.9 % IV SOLN
Freq: Once | INTRAVENOUS | Status: AC
  Administered 2018-08-09: 11:00:00 via INTRAVENOUS
  Filled 2018-08-09: qty 250

## 2018-08-09 MED ORDER — HEPARIN SOD (PORK) LOCK FLUSH 100 UNIT/ML IV SOLN
500.0000 [IU] | Freq: Once | INTRAVENOUS | Status: AC
  Administered 2018-08-09: 500 [IU] via INTRAVENOUS

## 2018-08-09 NOTE — Progress Notes (Signed)
Pt here for f/u. Denies any complaints at this time.

## 2018-08-10 LAB — T4: T4, Total: 8.6 ug/dL (ref 4.5–12.0)

## 2018-08-11 ENCOUNTER — Other Ambulatory Visit: Payer: Self-pay | Admitting: *Deleted

## 2018-08-11 MED ORDER — OXYCODONE HCL 10 MG PO TABS: 10.0000 mg | ORAL_TABLET | ORAL | 0 refills | Status: DC | PRN

## 2018-08-12 ENCOUNTER — Other Ambulatory Visit: Payer: Self-pay

## 2018-08-12 ENCOUNTER — Encounter: Payer: Self-pay | Admitting: Hospice and Palliative Medicine

## 2018-08-12 ENCOUNTER — Ambulatory Visit
Admission: RE | Admit: 2018-08-12 | Discharge: 2018-08-12 | Disposition: A | Discharge status: Home / Self Care | Payer: Medicare Other | Source: Ambulatory Visit | Attending: Radiation Oncology | Admitting: Radiation Oncology

## 2018-08-12 ENCOUNTER — Inpatient Hospital Stay (HOSPITAL_BASED_OUTPATIENT_CLINIC_OR_DEPARTMENT_OTHER): Payer: Medicare Other | Admitting: Hospice and Palliative Medicine

## 2018-08-12 ENCOUNTER — Encounter: Payer: Self-pay | Admitting: Radiation Oncology

## 2018-08-12 VITALS — BP 114/81 | HR 81 | Temp 97.5°F | Resp 16 | Wt 157.7 lb

## 2018-08-12 VITALS — BP 114/71 | HR 79 | Temp 97.9°F | Resp 18 | Wt 154.6 lb

## 2018-08-12 DIAGNOSIS — Z515 Encounter for palliative care: Secondary | ICD-10-CM

## 2018-08-12 DIAGNOSIS — Z9221 Personal history of antineoplastic chemotherapy: Secondary | ICD-10-CM | POA: Diagnosis not present

## 2018-08-12 DIAGNOSIS — C7951 Secondary malignant neoplasm of bone: Secondary | ICD-10-CM

## 2018-08-12 DIAGNOSIS — G893 Neoplasm related pain (acute) (chronic): Secondary | ICD-10-CM

## 2018-08-12 DIAGNOSIS — Z923 Personal history of irradiation: Secondary | ICD-10-CM | POA: Insufficient documentation

## 2018-08-12 DIAGNOSIS — Z79899 Other long term (current) drug therapy: Secondary | ICD-10-CM | POA: Diagnosis not present

## 2018-08-12 DIAGNOSIS — Z87891 Personal history of nicotine dependence: Secondary | ICD-10-CM | POA: Diagnosis not present

## 2018-08-12 DIAGNOSIS — C3431 Malignant neoplasm of lower lobe, right bronchus or lung: Secondary | ICD-10-CM

## 2018-08-12 DIAGNOSIS — Z7982 Long term (current) use of aspirin: Secondary | ICD-10-CM

## 2018-08-12 NOTE — Progress Notes (Signed)
Radiation Oncology Follow up Note  Name: Jimmy West   Date:   08/12/2018 MRN:  [id number] DOB: 08/09/1950    This 68 y.o. male presents to the clinic today for one-month follow-up status post palliative radiation therapy to thoracic spine for known stage IV lung cancer.  REFERRING PROVIDER: [name], NP  HPI: patient is a 68 year old male now out 1 month having completed palliative ration therapy to his thoracic spine for patient with known stage IV adenocarcinoma of the lung. He is currently undergoing immunotherapy with.pembrolizumab.he states his pain has improved although is still present. He is having no problems with ambulation no focal neurologic deficits or motor or sensory levels.he specifically denies dysphagia. He is scheduled to see doctors at [facility] for possible kyphoplasty and that is in arrangement has ordered been made.  COMPLICATIONS OF TREATMENT: none  FOLLOW UP COMPLIANCE: keeps appointments   PHYSICAL EXAM:  BP 114/81   Pulse 81   Temp (!) 97.5 F (36.4 C)   Resp 16   Wt 157 lb 11.8 oz (71.6 kg)   BMI 24.71 kg/m  Motor sensory and DTR levels are equal and symmetric in the upper lower extremities. Deep palpation of the spine doesn't elicit some slight pain. He is ambulating well without assistance.Well-developed well-nourished patient in NAD. HEENT reveals PERLA, EOMI, discs not visualized.  Oral cavity is clear. No oral mucosal lesions are identified. Neck is clear without evidence of cervical or supraclavicular adenopathy. Lungs are clear to A&P. Cardiac examination is essentially unremarkable with regular rate and rhythm without murmur rub or thrill. Abdomen is benign with no organomegaly or masses noted. Motor sensory and DTR levels are equal and symmetric in the upper and lower extremities. Cranial nerves II through XII are grossly intact. Proprioception is intact. No peripheral adenopathy or edema is identified. No motor or sensory levels are noted.  Crude visual fields are within normal range.  RADIOLOGY RESULTS: no films for review  PLAN: present time patient has achieved some good palliation from her Radiation therapy treatments. He will be evaluated at [facility] for possible kyphoplasty. I will turn follow-up care over to medical oncology. I would be happy to reevaluate the patient any time should further treatment be indicated.  I would like to take this opportunity to thank you for allowing me to participate in the care of your patient.Noreene Filbert, MD

## 2018-08-12 NOTE — Progress Notes (Signed)
[place]  [phone] [phone]   Name: Jimmy West Date: 08/12/2018 MRN: [id number]  DOB: Apr 09, 1951  Patient Care Team: [name], NP as PCP - General (Nurse Practitioner) Telford Nab, RN as Registered Nurse [place], Gerda Diss, MD as Attending Physician (Emergency Medicine)    REASON FOR CONSULTATION: Palliative Care consult requested for this 68 y.o. male with multiple medical problems including presumed metastatic lung cancer.  Patient was noted to have pulmonary nodules on recent chest x-ray from PCP.  He was subsequently referred to oncology for work-up and saw Dr. Mike Gip on 04/30/2018.  PET scan revealed several hypermetabolic pulmonary nodules and activity within the T4 vertebral body.  Follow-up MRI of the spine revealed T4 metastasis with pathologic compression deformity and mild spinal canal stenosis with probable edema and extraosseous extension of neoplasm from mid T3 to mid T5. Patient was referred to the palliative care clinic to assist with symptom management and establishing treatment goals.   SOCIAL HISTORY:    Patient has not married and has no children.  He has a brother who lives out of state.  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  ADVANCE DIRECTIVES:  Does not have.  CODE STATUS: Full code  PAST MEDICAL HISTORY: Past Medical History:  Diagnosis Date  . Bulging lumbar disc   . Cancer ([place])   . Heart attack ([place])     PAST SURGICAL HISTORY:  Past Surgical History:  Procedure Laterality Date  . CARDIAC CATHETERIZATION     two stents  . KNEE SURGERY Left   . PORTA CATH INSERTION N/A 05/21/2018   Procedure: PORTA CATH INSERTION;  Surgeon: Algernon Huxley, MD;  Location: [facility];  Service: Cardiovascular;  Laterality: N/A;    HEMATOLOGY/ONCOLOGY HISTORY:    Primary cancer of right lower lobe of lung ([place])   04/26/2018 Initial Diagnosis    Primary cancer of right lower lobe of lung ([place])    05/24/2018 -  Chemotherapy    The patient had pembrolizumab (KEYTRUDA) 200 mg in sodium chloride 0.9 % 50 mL chemo infusion, 200 mg, Intravenous, Once, 4 of 6 cycles Administration: 200 mg (05/24/2018), 200 mg (07/16/2018), 200 mg (08/09/2018), 200 mg (06/14/2018)  for chemotherapy treatment.      ALLERGIES:  has No Known Allergies.  MEDICATIONS:  Current Outpatient Medications  Medication Sig Dispense Refill  . acetaminophen (TYLENOL) 500 MG tablet Take 500 mg by mouth 3 (three) times daily as needed.    Marland Kitchen aspirin EC 81 MG tablet Take 81 mg by mouth daily.    . Calcium 600-400 MG-UNIT CHEW Chew 2 tablets by mouth daily.    Marland Kitchen ibuprofen (ADVIL,MOTRIN) 200 MG tablet Take 200 mg by mouth every 6 (six) hours as needed.    . lidocaine-prilocaine (EMLA) cream Apply to affected area once 30 g 3  . morphine (MS CONTIN) 30 MG 12 hr tablet Take 1 tablet (30 mg total) by mouth every 12 (twelve) hours. 60 tablet 0  . naloxone (NARCAN) nasal spray 4 mg/0.1 mL 1 spray by nose in the event of opioid overdose. Call EMS (911) immediately if medication is used. 1 kit 0  . ondansetron (ZOFRAN) 8 MG tablet Take 1 tablet (8 mg total) by mouth 2 (two) times daily as needed (Nausea or vomiting). Start if needed on the third day after chemotherapy. 30 tablet 1  . Oxycodone HCl 10 MG TABS  Take 1 tablet (10 mg total) by mouth every 4 (four) hours as needed. 60 tablet 0  . senna (SENOKOT) 8.6 MG tablet Take 1 tablet by mouth as needed.     . polyethylene glycol (MIRALAX / GLYCOLAX) packet Take 17 g by mouth daily.     No current facility-administered medications for this visit.     VITAL SIGNS: BP 114/71 (BP Location: Left Arm, Patient Position: Sitting)   Pulse 79   Temp 97.9 F (36.6 C) (Tympanic)   Resp 18   Wt 154 lb 9.6 oz (70.1 kg)   BMI 24.21 kg/m  Filed Weights   08/12/18 1441  Weight: 154 lb 9.6 oz (70.1 kg)      Estimated body mass index is 24.21 kg/m as calculated from the following:   Height as of 07/16/18: _0  (1.702 m).   Weight as of this encounter: 154 lb 9.6 oz (70.1 kg).  LABS: CBC:    Component Value Date/Time   WBC 5.7 08/09/2018 0900   HGB 12.3 (L) 08/09/2018 0900   HGB 14.9 04/06/2014 0826   HCT 37.0 (L) 08/09/2018 0900   HCT 44.8 04/06/2014 0826   PLT 214 08/09/2018 0900   PLT 238 04/06/2014 0826   MCV 85.3 08/09/2018 0900   MCV 95 04/06/2014 0826   NEUTROABS 3.7 08/09/2018 0900   NEUTROABS 3.9 04/06/2014 0826   LYMPHSABS 0.7 08/09/2018 0900   LYMPHSABS 1.1 04/06/2014 0826   MONOABS 0.8 08/09/2018 0900   MONOABS 0.7 04/06/2014 0826   EOSABS 0.6 (H) 08/09/2018 0900   EOSABS 0.1 04/06/2014 0826   BASOSABS 0.0 08/09/2018 0900   BASOSABS 0.1 04/06/2014 0826   Comprehensive Metabolic Panel:    Component Value Date/Time   NA 136 08/09/2018 0900   NA 140 04/06/2014 0826   K 3.7 08/09/2018 0900   K 4.0 04/06/2014 0826   CL 103 08/09/2018 0900   CL 108 (H) 04/06/2014 0826   CO2 24 08/09/2018 0900   CO2 25 04/06/2014 0826   BUN 10 08/09/2018 0900   BUN 12 04/06/2014 0826   CREATININE 0.82 08/09/2018 0900   CREATININE 1.17 04/06/2014 0826   GLUCOSE 112 (H) 08/09/2018 0900   GLUCOSE 106 (H) 04/06/2014 0826   CALCIUM 9.0 08/09/2018 0900   CALCIUM 8.9 04/06/2014 0826   AST 20 08/09/2018 0900   AST 24 04/06/2014 0826   ALT 17 08/09/2018 0900   ALT 33 04/06/2014 0826   ALKPHOS 69 08/09/2018 0900   ALKPHOS 85 04/06/2014 0826   BILITOT 0.7 08/09/2018 0900   BILITOT 0.8 04/06/2014 0826   PROT 6.8 08/09/2018 0900   PROT 7.3 04/06/2014 0826   ALBUMIN 3.6 08/09/2018 0900   ALBUMIN 3.8 04/06/2014 0826    RADIOGRAPHIC STUDIES: No results found.  PERFORMANCE STATUS (ECOG) : 1 - Symptomatic but completely ambulatory  Review of Systems As noted above. Otherwise, a complete review of systems is negative.  Physical Exam General: NAD, frail appearing,  thin Cardiovascular: regular rate and rhythm Pulmonary: clear ant fields Abdomen: soft, nontender, + bowel sounds GU: no suprapubic tenderness Extremities: no edema Skin: no rashes Neurological: Weakness but otherwise nonfocal  IMPRESSION: Follow-up visit today in the clinic for pain.  Patient is accompanied by his friend.   Patient says that his pain has been significantly improved over the past week or two. He tolerated rotation to MS Contin without any adverse effects. Pain is variable throughout the day and patient continues to use oxycodone for management of BTP.  He says that many days he will use it 4-5 times per day but may use it as much as 6-8 times if pain is severe, although he again clarifies that pain has generally been better recently.   Will continue current opioid regimen with MS Contin 77m Q12H and oxycodone 1109mQ4-6H PRN.   Discussed constipation management.   Appetite reportedly stable.   Patient says he has had some difficulty paying for medications. His health insurance does not have a medication plan. Will refer to see JaBarnabas ListerSW.   PLAN: 1. Continue oxycodone 1012m4-6H PRN 2. Continue MS Contin 95m75m2H 3. Prophylactic bowel regimen 4.  Will need to readdress ACP during a future visit 5.  RTC in 3 weeks  Patient expressed understanding and was in agreement with this plan. He also understands that He can call clinic at any time with any questions, concerns, or complaints.   Time Total: 20 minutes  Visit consisted of counseling and education dealing with the complex and emotionally intense issues of symptom management and palliative care in the setting of serious and potentially life-threatening illness.Greater than 50%  of this time was spent counseling and coordinating care related to the above assessment and plan.  Signed by: JoshAltha HarmD, DNP, NP-C, [facility]-[phone] Cell)

## 2018-08-16 DIAGNOSIS — C7951 Secondary malignant neoplasm of bone: Secondary | ICD-10-CM | POA: Diagnosis not present

## 2018-08-16 DIAGNOSIS — Z923 Personal history of irradiation: Secondary | ICD-10-CM | POA: Diagnosis not present

## 2018-08-16 DIAGNOSIS — C3490 Malignant neoplasm of unspecified part of unspecified bronchus or lung: Secondary | ICD-10-CM | POA: Diagnosis not present

## 2018-08-16 DIAGNOSIS — Z01818 Encounter for other preprocedural examination: Secondary | ICD-10-CM | POA: Diagnosis not present

## 2018-08-24 ENCOUNTER — Other Ambulatory Visit: Payer: Self-pay | Admitting: *Deleted

## 2018-08-24 MED ORDER — OXYCODONE HCL 10 MG PO TABS: 10.0000 mg | ORAL_TABLET | ORAL | 0 refills | Status: DC | PRN

## 2018-08-24 MED ORDER — MORPHINE SULFATE ER 30 MG PO TBCR: 30.0000 mg | EXTENDED_RELEASE_TABLET | Freq: Two times a day (BID) | ORAL | 0 refills | Status: DC

## 2018-08-24 NOTE — Telephone Encounter (Signed)
Requests refill of oxycodone and MS Contin. Per Elease Etienne, will cover medication cost under [employer]. Prescriptions will need to be sent to [facility].

## 2018-08-26 ENCOUNTER — Ambulatory Visit
Admission: RE | Admit: 2018-08-26 | Discharge: 2018-08-26 | Disposition: A | Discharge status: Home / Self Care | Payer: Medicare Other | Source: Ambulatory Visit | Attending: Urgent Care | Admitting: Urgent Care

## 2018-08-26 DIAGNOSIS — G893 Neoplasm related pain (acute) (chronic): Secondary | ICD-10-CM | POA: Diagnosis not present

## 2018-08-26 DIAGNOSIS — C3431 Malignant neoplasm of lower lobe, right bronchus or lung: Secondary | ICD-10-CM | POA: Insufficient documentation

## 2018-08-26 DIAGNOSIS — C7951 Secondary malignant neoplasm of bone: Secondary | ICD-10-CM | POA: Insufficient documentation

## 2018-08-26 DIAGNOSIS — C7972 Secondary malignant neoplasm of left adrenal gland: Secondary | ICD-10-CM | POA: Diagnosis not present

## 2018-08-26 MED ORDER — IOHEXOL 300 MG/ML  SOLN
75.0000 mL | Freq: Once | INTRAMUSCULAR | Status: AC | PRN
  Administered 2018-08-26: 75 mL via INTRAVENOUS

## 2018-08-27 ENCOUNTER — Ambulatory Visit: Payer: Medicare Other

## 2018-08-30 ENCOUNTER — Other Ambulatory Visit: Payer: Self-pay | Admitting: Hematology and Oncology

## 2018-08-30 ENCOUNTER — Inpatient Hospital Stay: Payer: Medicare Other

## 2018-08-30 ENCOUNTER — Inpatient Hospital Stay: Payer: Medicare Other | Attending: Hematology and Oncology | Admitting: Hematology and Oncology

## 2018-08-30 ENCOUNTER — Encounter: Payer: Self-pay | Admitting: Hematology and Oncology

## 2018-08-30 VITALS — BP 120/79 | HR 68 | Temp 98.0°F | Resp 18 | Ht 67.0 in | Wt 153.7 lb

## 2018-08-30 VITALS — BP 117/72 | HR 66

## 2018-08-30 DIAGNOSIS — C7972 Secondary malignant neoplasm of left adrenal gland: Secondary | ICD-10-CM | POA: Diagnosis not present

## 2018-08-30 DIAGNOSIS — C7951 Secondary malignant neoplasm of bone: Secondary | ICD-10-CM | POA: Insufficient documentation

## 2018-08-30 DIAGNOSIS — Z79899 Other long term (current) drug therapy: Secondary | ICD-10-CM | POA: Insufficient documentation

## 2018-08-30 DIAGNOSIS — C3431 Malignant neoplasm of lower lobe, right bronchus or lung: Secondary | ICD-10-CM

## 2018-08-30 DIAGNOSIS — G893 Neoplasm related pain (acute) (chronic): Secondary | ICD-10-CM

## 2018-08-30 DIAGNOSIS — R634 Abnormal weight loss: Secondary | ICD-10-CM

## 2018-08-30 DIAGNOSIS — Z5112 Encounter for antineoplastic immunotherapy: Secondary | ICD-10-CM

## 2018-08-30 LAB — COMPREHENSIVE METABOLIC PANEL
ALT: 11 U/L (ref 0–44)
AST: 16 U/L (ref 15–41)
Albumin: 3.7 g/dL (ref 3.5–5.0)
Alkaline Phosphatase: 73 U/L (ref 38–126)
Anion gap: 8 (ref 5–15)
BUN: 10 mg/dL (ref 8–23)
CO2: 25 mmol/L (ref 22–32)
Calcium: 9.3 mg/dL (ref 8.9–10.3)
Chloride: 103 mmol/L (ref 98–111)
Creatinine, Ser: 0.8 mg/dL (ref 0.61–1.24)
GFR calc Af Amer: >60 mL/min (ref >60)
GFR calc non Af Amer: >60 mL/min (ref >60)
Glucose, Bld: 111 mg/dL — ABNORMAL HIGH (ref 70–99)
Potassium: 4.2 mmol/L (ref 3.5–5.1)
Sodium: 136 mmol/L (ref 135–145)
Total Bilirubin: 1.1 mg/dL (ref 0.3–1.2)
Total Protein: 7.1 g/dL (ref 6.5–8.1)

## 2018-08-30 LAB — CBC WITH DIFFERENTIAL/PLATELET
Abs Immature Granulocytes: 0.02 10*3/uL (ref 0.00–0.07)
Basophils Absolute: 0.1 10*3/uL (ref 0.0–0.1)
Basophils Relative: 1 %
Eosinophils Absolute: 0.3 10*3/uL (ref 0.0–0.5)
Eosinophils Relative: 5 %
HCT: 38.9 % — ABNORMAL LOW (ref 39.0–52.0)
Hemoglobin: 12.8 g/dL — ABNORMAL LOW (ref 13.0–17.0)
Immature Granulocytes: 0 %
Lymphocytes Relative: 12 %
Lymphs Abs: 0.7 10*3/uL (ref 0.7–4.0)
MCH: 27.7 pg (ref 26.0–34.0)
MCHC: 32.9 g/dL (ref 30.0–36.0)
MCV: 84.2 fL (ref 80.0–100.0)
Monocytes Absolute: 0.7 10*3/uL (ref 0.1–1.0)
Monocytes Relative: 10 %
Neutro Abs: 4.6 10*3/uL (ref 1.7–7.7)
Neutrophils Relative %: 72 %
Platelets: 303 10*3/uL (ref 150–400)
RBC: 4.62 MIL/uL (ref 4.22–5.81)
RDW: 13.8 % (ref 11.5–15.5)
WBC: 6.3 10*3/uL (ref 4.0–10.5)
nRBC: 0 % (ref 0.0–0.2)

## 2018-08-30 LAB — TSH: TSH: 1.62 u[IU]/mL (ref 0.350–4.500)

## 2018-08-30 MED ORDER — SODIUM CHLORIDE 0.9 % IV SOLN
200.0000 mg | Freq: Once | INTRAVENOUS | Status: AC
  Administered 2018-08-30: 200 mg via INTRAVENOUS
  Filled 2018-08-30: qty 8

## 2018-08-30 MED ORDER — SODIUM CHLORIDE 0.9 % IV SOLN
Freq: Once | INTRAVENOUS | Status: AC
  Administered 2018-08-30: 11:00:00 via INTRAVENOUS
  Filled 2018-08-30: qty 250

## 2018-08-30 MED ORDER — HEPARIN SOD (PORK) LOCK FLUSH 100 UNIT/ML IV SOLN
500.0000 [IU] | Freq: Once | INTRAVENOUS | Status: AC
  Administered 2018-08-30: 500 [IU] via INTRAVENOUS
  Filled 2018-08-30: qty 5

## 2018-08-30 MED ORDER — SODIUM CHLORIDE 0.9% FLUSH
10.0000 mL | INTRAVENOUS | Status: DC | PRN
  Administered 2018-08-30: 10 mL via INTRAVENOUS
  Filled 2018-08-30: qty 10

## 2018-08-30 NOTE — Patient Instructions (Signed)
Pembrolizumab injection  What is this medicine?  PEMBROLIZUMAB (pem broe liz ue mab) is a monoclonal antibody. It is used to treat cervical cancer, esophageal cancer, head and neck cancer, hepatocellular cancer, Hodgkin lymphoma, kidney cancer, lymphoma, melanoma, Merkel cell carcinoma, lung cancer, stomach cancer, urothelial cancer, and cancers that have a certain genetic condition.  This medicine may be used for other purposes; ask your health care provider or pharmacist if you have questions.  COMMON BRAND NAME(S): Keytruda  What should I tell my health care provider before I take this medicine?  They need to know if you have any of these conditions:  -diabetes  -immune system problems  -inflammatory bowel disease  -liver disease  -lung or breathing disease  -lupus  -received or scheduled to receive an organ transplant or a stem-cell transplant that uses donor stem cells  -an unusual or allergic reaction to pembrolizumab, other medicines, foods, dyes, or preservatives  -pregnant or trying to get pregnant  -breast-feeding  How should I use this medicine?  This medicine is for infusion into a vein. It is given by a health care professional in a hospital or clinic setting.  A special MedGuide will be given to you before each treatment. Be sure to read this information carefully each time.  Talk to your pediatrician regarding the use of this medicine in children. While this drug may be prescribed for selected conditions, precautions do apply.  Overdosage: If you think you have taken too much of this medicine contact a poison control center or emergency room at once.  NOTE: This medicine is only for you. Do not share this medicine with others.  What if I miss a dose?  It is important not to miss your dose. Call your doctor or health care professional if you are unable to keep an appointment.  What may interact with this medicine?  Interactions have not been studied.  Give your health care provider a list of all the  medicines, herbs, non-prescription drugs, or dietary supplements you use. Also tell them if you smoke, drink alcohol, or use illegal drugs. Some items may interact with your medicine.  This list may not describe all possible interactions. Give your health care provider a list of all the medicines, herbs, non-prescription drugs, or dietary supplements you use. Also tell them if you smoke, drink alcohol, or use illegal drugs. Some items may interact with your medicine.  What should I watch for while using this medicine?  Your condition will be monitored carefully while you are receiving this medicine.  You may need blood work done while you are taking this medicine.  Do not become pregnant while taking this medicine or for 4 months after stopping it. Women should inform their doctor if they wish to become pregnant or think they might be pregnant. There is a potential for serious side effects to an unborn child. Talk to your health care professional or pharmacist for more information. Do not breast-feed an infant while taking this medicine or for 4 months after the last dose.  What side effects may I notice from receiving this medicine?  Side effects that you should report to your doctor or health care professional as soon as possible:  -allergic reactions like skin rash, itching or hives, swelling of the face, lips, or tongue  -bloody or black, tarry  -breathing problems  -changes in vision  -chest pain  -chills  -confusion  -constipation  -cough  -diarrhea  -dizziness or feeling faint or lightheaded  -  fast or irregular heartbeat  -fever  -flushing  -hair loss  -joint pain  -low blood counts - this medicine may decrease the number of white blood cells, red blood cells and platelets. You may be at increased risk for infections and bleeding.  -muscle pain  -muscle weakness  -persistent headache  -redness, blistering, peeling or loosening of the skin, including inside the mouth  -signs and symptoms of high blood sugar  such as dizziness; dry mouth; dry skin; fruity breath; nausea; stomach pain; increased hunger or thirst; increased urination  -signs and symptoms of kidney injury like trouble passing urine or change in the amount of urine  -signs and symptoms of liver injury like dark urine, light-colored stools, loss of appetite, nausea, right upper belly pain, yellowing of the eyes or skin  -sweating  -swollen lymph nodes  -weight loss  Side effects that usually do not require medical attention (report to your doctor or health care professional if they continue or are bothersome):  -decreased appetite  -muscle pain  -tiredness  This list may not describe all possible side effects. Call your doctor for medical advice about side effects. You may report side effects to FDA at 1-800-FDA-1088.  Where should I keep my medicine?  This drug is given in a hospital or clinic and will not be stored at home.  NOTE: This sheet is a summary. It may not cover all possible information. If you have questions about this medicine, talk to your doctor, pharmacist, or health care provider.   2019 [employer]vier/Gold Standard (2018-02-18 15:06:10)

## 2018-08-30 NOTE — Progress Notes (Signed)
Spoke with patient about meeting with Joli, the dietitian, next time he is in [place].  Patient verbalized understanding.

## 2018-08-30 NOTE — Progress Notes (Signed)
[facility] day:  08/30/2018   Chief Complaint: Jimmy West is a 68 y.o. male with metastatic adenocarcinoma of the lung who is seen for review of interval chest CT and assessment prior to cycle #5 pembrolizumab.  HPI:  The patient was last seen in the medical oncology clinic on 08/09/2018.  At that time, he was doing well.  He only noted fatigue.  Back pain was well controlled.  Exam was stable.  He received cycle #4 pembrolizumab.  He received Xgeva.  Thoracic spine CT at [facility] on 08/17/2018 revealed pathologic compression fracture of T4 resulting in vertebra plana. Fracture fragments protrude into the spinal canal resulting in moderate to severe canal narrowing at T4.  There was severe right and moderate left foraminal narrowing at T4-T5 secondary to vertebra plana.  There were multiple spiculated pulmonary masses consistent with metastatic adenocarcinoma, similar in appearance to April 30, 2018 PET/CT.  Chest CT on 08/26/2018 revealed a 3.1 cm right lower lobe mass, corresponding to known primary bronchogenic neoplasm, mildly decreased.  Bilateral pulmonary nodules/metastases, most of which are improved.  There was mildly progressive pleural-based nodularity in the right upper lobe along the minor fissure, including a dominant 17 x 11 mm nodule.  There was a small right pleural effusion, decreased.  There was a 15 mm left adrenal metastasis, decreased.  There was aortic atherosclerosis and emphysema.  He was contacted by Towanda Malkin, PA at [facility] on 08/27/2018 regarding his progressive pathologic compression fracture of T4.  Patient denied any symptoms.  He is scheduled to return on 09/06/2018 to discuss surgery with Dr. Nikki Dom.  Symptomatically, patient is fatigued. His back pain persists, but is well controlled with the currently prescribed interventions. Patient denies that he has experienced any B symptoms. He denies any interval  infections. No nausea, vomiting, or bowel changes following last cycle of treatment.   Patient advises that he maintains a poor appetite. He is not eating well. He is supplementing with a Premier Protein shake, which is low calorie (< 200) and high protein (30 g), once daily. Weight today is 153 lb 10.6 oz (69.7 kg), which compared to his last visit to the clinic, represents a 3 pound decrease.    Patient denies pain in the clinic today.   Past Medical History:  Diagnosis Date  . Bulging lumbar disc   . Cancer ([place])   . Heart attack [facility])     Past Surgical History:  Procedure Laterality Date  . CARDIAC CATHETERIZATION     two stents  . KNEE SURGERY Left   . PORTA CATH INSERTION N/A 05/21/2018   Procedure: PORTA CATH INSERTION;  Surgeon: Algernon Huxley, MD;  Location: [facility];  Service: Cardiovascular;  Laterality: N/A;    Family History  Problem Relation Age of Onset  . Heart failure Father   . Cancer Maternal Aunt   . Heart failure Maternal Uncle   . Dementia Paternal Grandmother   . Cancer Maternal Aunt     Social History:  reports that he quit smoking about 14 years ago. His smoking use included cigarettes. He has a 22.50 pack-year smoking history. He has never used smokeless tobacco. He reports current alcohol use of about 15.0 standard drinks of alcohol per week. He reports that he does not use drugs. He started smoking at age 79.   He is smoking 1 1/2 packs/day.  Patient is employed as as full time Probation officer. Patient denies  known exposures to radiation on toxins. The patient is accompanied by a woman today.  Allergies: No Known Allergies  Current Medications: Current Outpatient Medications  Medication Sig Dispense Refill  . aspirin EC 81 MG tablet Take 81 mg by mouth daily.    . Calcium 600-400 MG-UNIT CHEW Chew 2 tablets by mouth daily.    Marland Kitchen morphine (MS CONTIN) 30 MG 12 hr tablet Take 1 tablet (30 mg total) by mouth every 12 (twelve) hours. 60 tablet 0   . Oxycodone HCl 10 MG TABS Take 1 tablet (10 mg total) by mouth every 4 (four) hours as needed. 60 tablet 0  . acetaminophen (TYLENOL) 500 MG tablet Take 500 mg by mouth 3 (three) times daily as needed.    Marland Kitchen ibuprofen (ADVIL,MOTRIN) 200 MG tablet Take 200 mg by mouth every 6 (six) hours as needed.    . lidocaine-prilocaine (EMLA) cream Apply to affected area once (Patient not taking: Reported on 08/30/2018) 30 g 3  . naloxone (NARCAN) nasal spray 4 mg/0.1 mL 1 spray by nose in the event of opioid overdose. Call EMS (911) immediately if medication is used. (Patient not taking: Reported on 08/30/2018) 1 kit 0  . ondansetron (ZOFRAN) 8 MG tablet Take 1 tablet (8 mg total) by mouth 2 (two) times daily as needed (Nausea or vomiting). Start if needed on the third day after chemotherapy. (Patient not taking: Reported on 08/30/2018) 30 tablet 1  . polyethylene glycol (MIRALAX / GLYCOLAX) packet Take 17 g by mouth daily.    Marland Kitchen senna (SENOKOT) 8.6 MG tablet Take 1 tablet by mouth as needed.      No current facility-administered medications for this visit.    Facility-Administered Medications Ordered in Other Visits  Medication Dose Route Frequency Provider Last Rate Last Dose  . heparin lock flush 100 unit/mL  500 Units Intravenous Once Corcoran, Melissa C, MD      . sodium chloride flush (NS) 0.9 % injection 10 mL  10 mL Intravenous PRN Nolon Stalls C, MD   10 mL at 08/30/18 0955    Review of Systems  Constitutional: Positive for weight loss (3 pounds). Negative for chills, diaphoresis, fever and malaise/fatigue (post treatment).       Feels "ok".  HENT: Negative for congestion, ear discharge, ear pain, nosebleeds, sinus pain and sore throat.   Eyes: Negative.  Negative for blurred vision, double vision, photophobia, pain, discharge and redness.  Respiratory: Negative.  Negative for cough, hemoptysis, sputum production, shortness of breath and wheezing.   Cardiovascular: Negative.  Negative for  chest pain, palpitations, orthopnea, leg swelling and PND.  Gastrointestinal: Negative.  Negative for abdominal pain, blood in stool, constipation, diarrhea, heartburn, melena, nausea and vomiting.       No appetite.  Genitourinary: Negative.  Negative for dysuria, frequency, hematuria and urgency.  Musculoskeletal: Positive for back pain (T4 bone metastasis). Negative for falls, joint pain, myalgias and neck pain.  Skin: Negative.  Negative for itching and rash.  Neurological: Positive for sensory change (chronic numbness to LEFT heel 2/2 spinal disk issues). Negative for dizziness, tingling, tremors, speech change, focal weakness, seizures and weakness.  Endo/Heme/Allergies: Negative.  Does not bruise/bleed easily.  Psychiatric/Behavioral: Negative for depression and memory loss. The patient is not nervous/anxious and does not have insomnia.   All other systems reviewed and are negative.  Performance status (ECOG): 1 Vital Signs BP 120/79 (BP Location: Left Arm, Patient Position: Sitting)   Pulse 68   Temp 98 F (36.7 C) (  Tympanic)   Resp 18   Ht _0  (1.702 m)   Wt 153 lb 10.6 oz (69.7 kg)   SpO2 100%   BMI 24.07 kg/m   Physical Exam  Constitutional: He is oriented to person, place, and time and well-developed, well-nourished, and in no distress. No distress.  HENT:  Head: Normocephalic and atraumatic.  Mouth/Throat: Oropharynx is clear and moist and mucous membranes are normal. He has dentures. No oropharyngeal exudate.  Short gray hair.  Mustache.  Eyes: Pupils are equal, round, and reactive to light. Conjunctivae and EOM are normal. No scleral icterus.  Glasses.  Blue eyes.  Neck: Normal range of motion. Neck supple. No JVD present.  Cardiovascular: Normal rate, regular rhythm, normal heart sounds and intact distal pulses. Exam reveals no gallop and no friction rub.  No murmur heard. Pulmonary/Chest: Effort normal and breath sounds normal. No respiratory distress. He has no  wheezes. He has no rales.  Abdominal: Soft. Bowel sounds are normal. He exhibits no distension and no mass. There is no abdominal tenderness. There is no rebound and no guarding.  Musculoskeletal: Normal range of motion.        General: Tenderness (slight tenderness at T4) present. No deformity or edema.  Lymphadenopathy:    He has no cervical adenopathy.    He has no axillary adenopathy.       Right: No supraclavicular adenopathy present.       Left: No supraclavicular adenopathy present.  Neurological: He is alert and oriented to person, place, and time. Gait normal.  Skin: Skin is warm and dry. No rash noted. He is not diaphoretic. No erythema. No pallor.  Faint hyperpigmentation at T4 secondary s/p radiation.  Psychiatric: Mood, affect and judgment normal.  Nursing note and vitals reviewed.   Infusion on 08/30/2018  Component Date Value Ref Range Status  . Sodium 08/30/2018 136  135 - 145 mmol/L Final  . Potassium 08/30/2018 4.2  3.5 - 5.1 mmol/L Final  . Chloride 08/30/2018 103  98 - 111 mmol/L Final  . CO2 08/30/2018 25  22 - 32 mmol/L Final  . Glucose, Bld 08/30/2018 111* 70 - 99 mg/dL Final  . BUN 08/30/2018 10  8 - 23 mg/dL Final  . Creatinine, Ser 08/30/2018 0.80  0.61 - 1.24 mg/dL Final  . Calcium 08/30/2018 9.3  8.9 - 10.3 mg/dL Final  . Total Protein 08/30/2018 7.1  6.5 - 8.1 g/dL Final  . Albumin 08/30/2018 3.7  3.5 - 5.0 g/dL Final  . AST 08/30/2018 16  15 - 41 U/L Final  . ALT 08/30/2018 11  0 - 44 U/L Final  . Alkaline Phosphatase 08/30/2018 73  38 - 126 U/L Final  . Total Bilirubin 08/30/2018 1.1  0.3 - 1.2 mg/dL Final  . GFR calc non Af Amer 08/30/2018 >60  >60 mL/min Final  . GFR calc Af Amer 08/30/2018 >60  >60 mL/min Final  . Anion gap 08/30/2018 8  5 - 15 Final   Performed at [facility], [address] [place], [place] [address]  . WBC 08/30/2018 6.3  4.0 - 10.5 K/uL Final  . RBC 08/30/2018 4.62  4.22 - 5.81 MIL/uL Final  . Hemoglobin  08/30/2018 12.8* 13.0 - 17.0 g/dL Final  . HCT 08/30/2018 38.9* 39.0 - 52.0 % Final  . MCV 08/30/2018 84.2  80.0 - 100.0 fL Final  . MCH 08/30/2018 27.7  26.0 - 34.0 pg Final  . MCHC 08/30/2018 32.9  30.0 - 36.0  g/dL Final  . RDW 08/30/2018 13.8  11.5 - 15.5 % Final  . Platelets 08/30/2018 303  150 - 400 K/uL Final  . nRBC 08/30/2018 0.0  0.0 - 0.2 % Final  . Neutrophils Relative % 08/30/2018 72  % Final  . Neutro Abs 08/30/2018 4.6  1.7 - 7.7 K/uL Final  . Lymphocytes Relative 08/30/2018 12  % Final  . Lymphs Abs 08/30/2018 0.7  0.7 - 4.0 K/uL Final  . Monocytes Relative 08/30/2018 10  % Final  . Monocytes Absolute 08/30/2018 0.7  0.1 - 1.0 K/uL Final  . Eosinophils Relative 08/30/2018 5  % Final  . Eosinophils Absolute 08/30/2018 0.3  0.0 - 0.5 K/uL Final  . Basophils Relative 08/30/2018 1  % Final  . Basophils Absolute 08/30/2018 0.1  0.0 - 0.1 K/uL Final  . Immature Granulocytes 08/30/2018 0  % Final  . Abs Immature Granulocytes 08/30/2018 0.02  0.00 - 0.07 K/uL Final   Performed at [facility], [address] [place], [place] [address]    Assessment:  Codi Kertz is a 68 y.o. 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  Pathology revealed an invasive high-grade adenocarcinoma, with predominantly solid growth pattern.  The neoplastic cells were TTF-1 (+), Napsin A (+), and P40 (+).  He has a T4 vertebral metastasis.  Clinical stage is T4N1M1.  There was not enough material for Foundation One testing.  PDL-1 revealed TPS 90%.  Chest CT angiogram on 04/27/2018 revealed no evidence of significant pulmonary embolus.  There was a 4.1 cm mass in the superior segment right lower lobe posteriorly causing obliteration of the in coming bronchus and mild postobstructive change. There were multiple additional bilateral pulmonary nodules (< 1 cm - 1.4 cm).   There were enlarged right hilar lymph  nodes.  There was metastasis to the T4 vertebra with mild associated compression.  There was emphysematous changes in the lungs.   PET scan on 04/30/2018 revealed a 3.4 cm hypermetabolic RLL pulmonary mass (SUV 13.2), 11 mm pulmonary nodule in the LEFT lung (SUV 4.5), RIGHT paratracheal lymph node (SUV 5.1), and hypermetabolic activity within the T4 vertebral body (SUV 10.2).   Thoracic spine MRI on 05/03/2018 revealed T4 metastasis with a 40% pathologic compression deformity and 5 mm of retropulsion of the vertebral body.  Retropulsion results in mild spinal canal stenosis and mild bilateral C4-5 foraminal stenosis.  There was paravertebral soft tissue thickening from mid T3 to mid T5, likely representing edema related to the pathologic compression deformity vs. possible extraosseous extension of the neoplasm.  There were no additional thoracic spinal metastases noted.   Head MRI on 05/03/2018 revealed no intracranial metastatic disease.  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  Incidental mention made of mild paranasal sinus disease.  He is s/p cycle #4 pembrolizumab (05/24/2018 - 08/09/2018).  He is tolerating treatment well.  He completed T4 radiation on 07/07/2018.  He receives Xgeva monthly (06/03/2018 - 08/09/2018).  Thor[facility] spine CT at [facility] on 08/17/2018 revealed pathologic compression fracture of T4 resulting in vertebra plana. Fracture fragments protrude into the spinal canal resulting in moderate to severe canal narrowing at T4.  There was severe right and moderate left foraminal narrowing at T4-T5 secondary to vertebra plana.  There were multiple  spiculated pulmonary masses consistent with metastatic adenocarcinoma, similar in appearance to April 30, 2018 PET/CT.  Chest CT on 08/26/2018 revealed a 3.1 cm right lower  lobe mass, corresponding to known primary bronchogenic neoplasm, mildly decreased.  Bilateral pulmonary nodules/metastases, most of which are improved.  There was mildly progressive pleural-based nodularity in the right upper lobe along the minor fissure, including a dominant 17 x 11 mm nodule.  There was a small right pleural effusion, decreased.  There was a 15 mm left adrenal metastasis, decreased.    Symptomatically, he is doing "ok".  He denies any respiratory symptoms.  He has lost weight.  He has mild tenderness at T4.  Plan: 1. Labs today:  CBC with diff, CMP, TSH, CEA. 2.   Metastatic high-grade adenocarcinoma of the RIGHT lung Patient is s/p 4 cycles of pembrolizumab. He is tolerating pembrolizumab well. Review interim scans.  Images personally reviewed.  Agree with radiology.  Overall, disease has improved.  Discuss close monitoring of pleural based RUL nodularity along the fissure. Cycle #5 pembrolizumab today. Discuss symptom management.   He has antiemetics and pain medications at home to use on a prn bases.  Interventions are adequate.    3.   T4 osseous metastases  Patient has completed radiation.  Follow-up with Dr Nikki Dom at [facility] on 09/06/2018.  Continue monthly Xgeva.  Continue calcium 1200 mg and vitamin D 800 IU daily. 4.   Pain and symptom management Pain fairly well controlled. Patient currently on MS-Contin 30 mg BID and oxycodone 10 mg q 4 hours PRN pain.  Follow-up as scheduled with Billey Chang, NP, from palliative care medicine for pain medication adjustment. 5.   Weight loss  Discuss caloric intake.  Change from premium protein (low cal) to Boost/Ensure shakes (samples provided). 6.   RTC on 09/20/2018 for MD assessment, labs (CBC with diff, CMP, TSH), and cycle #6 pembrolizumab.   Honor Loh, NP  08/30/2018, 10:47 AM   I saw and evaluated the patient, participating in the key portions of the service and reviewing pertinent diagnostic studies and records.   I reviewed the nurse practitioner's note and agree with the findings and the plan.  The assessment and plan were discussed with the patient.  Several questions were asked by the patient and answered.   Nolon Stalls, MD 08/30/2018,10:47 AM

## 2018-08-30 NOTE — Progress Notes (Signed)
No new changes noted today 

## 2018-08-31 LAB — T4: T4, Total: 8.7 ug/dL (ref 4.5–12.0)

## 2018-08-31 LAB — CEA: CEA: 1.3 ng/mL (ref 0.0–4.7)

## 2018-09-02 ENCOUNTER — Ambulatory Visit: Payer: Medicare Other | Admitting: Hospice and Palliative Medicine

## 2018-09-06 DIAGNOSIS — C7951 Secondary malignant neoplasm of bone: Secondary | ICD-10-CM | POA: Diagnosis not present

## 2018-09-07 ENCOUNTER — Other Ambulatory Visit: Payer: Self-pay | Admitting: *Deleted

## 2018-09-07 MED ORDER — OXYCODONE HCL 10 MG PO TABS: 10.0000 mg | ORAL_TABLET | ORAL | 0 refills | Status: DC | PRN

## 2018-09-07 NOTE — Telephone Encounter (Signed)
Pt requesting refill of oxycodone. Last fill on 2/4 with 10 day supply. Next follow up scheduled on 2/20 with Josh.

## 2018-09-09 ENCOUNTER — Encounter: Payer: Self-pay | Admitting: Hospice and Palliative Medicine

## 2018-09-09 ENCOUNTER — Inpatient Hospital Stay: Payer: Medicare Other | Attending: Hospice and Palliative Medicine | Admitting: Hospice and Palliative Medicine

## 2018-09-09 VITALS — BP 131/82 | HR 83 | Temp 98.0°F | Resp 18 | Wt 154.0 lb

## 2018-09-09 DIAGNOSIS — C3431 Malignant neoplasm of lower lobe, right bronchus or lung: Secondary | ICD-10-CM

## 2018-09-09 DIAGNOSIS — Z515 Encounter for palliative care: Secondary | ICD-10-CM

## 2018-09-09 DIAGNOSIS — C7951 Secondary malignant neoplasm of bone: Secondary | ICD-10-CM | POA: Insufficient documentation

## 2018-09-09 DIAGNOSIS — Z79899 Other long term (current) drug therapy: Secondary | ICD-10-CM | POA: Diagnosis not present

## 2018-09-09 DIAGNOSIS — G893 Neoplasm related pain (acute) (chronic): Secondary | ICD-10-CM | POA: Diagnosis not present

## 2018-09-09 DIAGNOSIS — C7972 Secondary malignant neoplasm of left adrenal gland: Secondary | ICD-10-CM | POA: Insufficient documentation

## 2018-09-09 DIAGNOSIS — Z7189 Other specified counseling: Secondary | ICD-10-CM

## 2018-09-09 NOTE — Progress Notes (Signed)
[place]  [phone] [phone]) [phone]   Name: Jimmy West Date: 09/09/2018 MRN: [id number]  DOB: October 23, 1950  Patient Care Team: [name], NP as PCP - General (Nurse Practitioner) Telford Nab, RN as Registered Nurse [place], Gerda Diss, MD as Attending Physician (Emergency Medicine)    REASON FOR CONSULTATION: Palliative Care consult requested for this 68 y.o. male with multiple medical problems including presumed metastatic lung cancer.  Patient was noted to have pulmonary nodules on recent chest x-ray from PCP.  He was subsequently referred to oncology for work-up and saw Dr. Mike Gip on 04/30/2018.  PET scan revealed several hypermetabolic pulmonary nodules and activity within the T4 vertebral body.  Follow-up MRI of the spine revealed T4 metastasis with pathologic compression deformity and mild spinal canal stenosis with probable edema and extraosseous extension of neoplasm from mid T3 to mid T5. Patient was referred to the palliative care clinic to assist with symptom management and establishing treatment goals.   SOCIAL HISTORY:    Patient has not married and has no children.  He has a brother who lives out of state.  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  ADVANCE DIRECTIVES:  Does not have.  CODE STATUS: Full code  PAST MEDICAL HISTORY: Past Medical History:  Diagnosis Date  . Bulging lumbar disc   . Cancer ([place])   . Heart attack ([place])     PAST SURGICAL HISTORY:  Past Surgical History:  Procedure Laterality Date  . CARDIAC CATHETERIZATION     two stents  . KNEE SURGERY Left   . PORTA CATH INSERTION N/A 05/21/2018   Procedure: PORTA CATH INSERTION;  Surgeon: Algernon Huxley, MD;  Location: [facility];  Service: Cardiovascular;  Laterality: N/A;    HEMATOLOGY/ONCOLOGY HISTORY:    Primary cancer of right lower lobe of lung ([place])   04/26/2018 Initial Diagnosis    Primary cancer of right lower lobe of lung ([place])    05/24/2018 -  Chemotherapy    The patient had pembrolizumab (KEYTRUDA) 200 mg in sodium chloride 0.9 % 50 mL chemo infusion, 200 mg, Intravenous, Once, 5 of 10 cycles Administration: 200 mg (05/24/2018), 200 mg (07/16/2018), 200 mg (08/09/2018), 200 mg (08/30/2018), 200 mg (06/14/2018)  for chemotherapy treatment.      ALLERGIES:  has No Known Allergies.  MEDICATIONS:  Current Outpatient Medications  Medication Sig Dispense Refill  . acetaminophen (TYLENOL) 500 MG tablet Take 500 mg by mouth 3 (three) times daily as needed.    Marland Kitchen aspirin EC 81 MG tablet Take 81 mg by mouth daily.    . Calcium 600-400 MG-UNIT CHEW Chew 2 tablets by mouth daily.    Marland Kitchen ibuprofen (ADVIL,MOTRIN) 200 MG tablet Take 200 mg by mouth every 6 (six) hours as needed.    . lidocaine-prilocaine (EMLA) cream Apply to affected area once (Patient not taking: Reported on 08/30/2018) 30 g 3  . morphine (MS CONTIN) 30 MG 12 hr tablet Take 1 tablet (30 mg total) by mouth every 12 (twelve) hours. 60 tablet 0  . naloxone (NARCAN) nasal spray 4 mg/0.1 mL 1 spray by nose in the event of opioid overdose. Call EMS (911) immediately if medication is used. (Patient not taking: Reported on 08/30/2018) 1 kit 0  . ondansetron (ZOFRAN) 8 MG tablet Take 1 tablet (8 mg total) by mouth 2 (two) times daily as needed (Nausea or vomiting). Start if needed on  the third day after chemotherapy. (Patient not taking: Reported on 08/30/2018) 30 tablet 1  . Oxycodone HCl 10 MG TABS Take 1 tablet (10 mg total) by mouth every 4 (four) hours as needed. 60 tablet 0  . polyethylene glycol (MIRALAX / GLYCOLAX) packet Take 17 g by mouth daily.    Marland Kitchen senna (SENOKOT) 8.6 MG tablet Take 1 tablet by mouth as needed.      No current facility-administered medications for this visit.     VITAL SIGNS: BP 131/82 (BP Location: Right Arm, Patient Position: Sitting)   Pulse 83   Temp 98 F  (36.7 C) (Tympanic)   Resp 18   Wt 154 lb (69.9 kg)   SpO2 97%   BMI 24.12 kg/m  Filed Weights   09/09/18 1134  Weight: 154 lb (69.9 kg)    Estimated body mass index is 24.12 kg/m as calculated from the following:   Height as of 08/30/18: '5\' 7"'$  (1.702 m).   Weight as of this encounter: 154 lb (69.9 kg).  LABS: CBC:    Component Value Date/Time   WBC 6.3 08/30/2018 0952   HGB 12.8 (L) 08/30/2018 0952   HGB 14.9 04/06/2014 0826   HCT 38.9 (L) 08/30/2018 0952   HCT 44.8 04/06/2014 0826   PLT 303 08/30/2018 0952   PLT 238 04/06/2014 0826   MCV 84.2 08/30/2018 0952   MCV 95 04/06/2014 0826   NEUTROABS 4.6 08/30/2018 0952   NEUTROABS 3.9 04/06/2014 0826   LYMPHSABS 0.7 08/30/2018 0952   LYMPHSABS 1.1 04/06/2014 0826   MONOABS 0.7 08/30/2018 0952   MONOABS 0.7 04/06/2014 0826   EOSABS 0.3 08/30/2018 0952   EOSABS 0.1 04/06/2014 0826   BASOSABS 0.1 08/30/2018 0952   BASOSABS 0.1 04/06/2014 0826   Comprehensive Metabolic Panel:    Component Value Date/Time   NA 136 08/30/2018 0952   NA 140 04/06/2014 0826   K 4.2 08/30/2018 0952   K 4.0 04/06/2014 0826   CL 103 08/30/2018 0952   CL 108 (H) 04/06/2014 0826   CO2 25 08/30/2018 0952   CO2 25 04/06/2014 0826   BUN 10 08/30/2018 0952   BUN 12 04/06/2014 0826   CREATININE 0.80 08/30/2018 0952   CREATININE 1.17 04/06/2014 0826   GLUCOSE 111 (H) 08/30/2018 0952   GLUCOSE 106 (H) 04/06/2014 0826   CALCIUM 9.3 08/30/2018 0952   CALCIUM 8.9 04/06/2014 0826   AST 16 08/30/2018 0952   AST 24 04/06/2014 0826   ALT 11 08/30/2018 0952   ALT 33 04/06/2014 0826   ALKPHOS 73 08/30/2018 0952   ALKPHOS 85 04/06/2014 0826   BILITOT 1.1 08/30/2018 0952   BILITOT 0.8 04/06/2014 0826   PROT 7.1 08/30/2018 0952   PROT 7.3 04/06/2014 0826   ALBUMIN 3.7 08/30/2018 0952   ALBUMIN 3.8 04/06/2014 0826    RADIOGRAPHIC STUDIES: Ct Chest W Contrast  Result Date: 08/26/2018 CLINICAL DATA:  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 CONTRAST:  38m OMNIPAQUE IOHEXOL 300 MG/ML  SOLN COMPARISON:  CTA chest dated 06/14/2018 FINDINGS: Cardiovascular: Heart is normal in size.  No pericardial effusion. No evidence of thoracic aortic aneurysm. Atherosclerotic calcifications of the aortic arch. Coronary atherosclerosis of the LAD. Right chest port terminates in the upper right atrium. Mediastinum/Nodes: No suspicious mediastinal lymphadenopathy. Visualized thyroid is unremarkable. Lungs/Pleura: Radiation changes in the bilateral paramediastinal regions. 3.1 x 2.4 cm right  lower lobe mass, previously 4.2 x 3.5 cm. Scattered bilateral pulmonary nodules/metastases, most of which are improved, including a 17 x 10 mm metastasis in the lateral right upper lobe which previously measured 17 x 12 mm. However, there is new pleural-based nodularity in the right upper lobe along the minor fissure, including a 17 x 11 mm nodule (series 3/image 82). Small right pleural effusion, decreased. Mild centrilobular and paraseptal emphysematous changes, upper lobe predominant. No pneumothorax. Upper Abdomen: Visualized upper abdomen is notable for a 15 x 13 mm left adrenal nodule (series 2/image 144), previously 18 x 13 mm. Indeterminate 15 x 15 mm lesion in the medial right upper kidney (series 2/image 156), possibly new. Musculoskeletal: Severe pathologic compression fracture deformity/vertebral plana at T4, with mild retropulsion. This is mildly progressive from the prior. IMPRESSION: 3.1 cm right lower lobe mass, corresponding to known primary bronchogenic neoplasm, mildly decreased. Bilateral pulmonary nodules/metastases, most of which are improved. Mildly progressive pleural-based nodularity in the right upper lobe along the minor fissure, including a dominant 17 x 11 mm nodule. Small right pleural effusion, decreased. 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 Aortic Atherosclerosis (ICD10-I70.0) and Emphysema (ICD10-J43.9). Electronically Signed   By: Julian Hy M.D.   On: 08/26/2018 18:09    PERFORMANCE STATUS (ECOG) : 1 - Symptomatic but completely ambulatory  Review of Systems As noted above. Otherwise, a complete review of systems is negative.  Physical Exam General: NAD, frail appearing, thin Cardiovascular: regular rate and rhythm Pulmonary: clear ant fields Abdomen: soft, nontender, + bowel sounds GU: no suprapubic tenderness Extremities: no edema Skin: no rashes Neurological: Weakness but otherwise nonfocal  IMPRESSION: Follow-up visit today in the clinic for pain.  Patient is unaccompanied.  Patient says that he was seen at [facility] on 09/06/2018.  Patient reports that imaging was worse.  Patient appears to be describing a bone fragment that was at risk of cord impingement.  He says he was told that kyphoplasty was not an option and that he would need surgical stabilization or possibly risk paralysis.  Patient is struggling with the decision for surgery.  Surgery would result in him being out of work for at least 6 weeks and would subsequently result in a significant financial toll.  However, patient obviously understands the potential for significant risk with foregoing or delaying surgical intervention.  Patient plans to speak with Dr. Mike Gip regarding her advice.  He also plans to speak with his friends to seek their counsel.  Patient says his pain has been moderately worse.  Strategies that have previously been effective such as heat and rest are now often ineffective.  Patient says he is not sure how effective the pain regimen is currently but has more frequent breakthrough pain.  We discussed increasing the MS Contin to every 8 hours but patient was not interested in taking an evening dose of the medication.  He has periodically taken 2 tablets of the oxycodone and tolerated it well.  I  discussed that he could increase the oxycodone to 2 tablets if needed.  I suspect that there is also an inflammatory component to the pain.  We discussed a trial of increasing the ibuprofen to 600 mg every 6 hours as needed.  It is also possible that there could be a neuropathic component given the description of the spinal lesion.  A trial of gabapentin could also be considered.  Discussed constipation management.   Appetite reportedly stable.   We discussed advance care plan.  Patient has ACP  documents at home and I encouraged him to complete those at his convenience.  We also discussed a MOST Form and will complete at a future visit.  PLAN: 1. Continue oxycodone 10-23m Q4-6H PRN 2. Continue MS Contin 384mQ12H 3.  May increase ibuprofen to 600 mg every 6 hours as needed 4.  Recommend daily PPI for gastric protection with NSAIDs 5.  Prophylactic bowel regimen 6.  Patient reviewing ACP documents at home 7.  RTC in 1 month  Patient expressed understanding and was in agreement with this plan. He also understands that He can call clinic at any time with any questions, concerns, or complaints.   Time Total: 30 minutes  Visit consisted of counseling and education dealing with the complex and emotionally intense issues of symptom management and palliative care in the setting of serious and potentially life-threatening illness.Greater than 50%  of this time was spent counseling and coordinating care related to the above assessment and plan.  Signed by: JoAltha HarmPhD, DNP, NP-C, ACWilliamsburg Regional [phone] Cell)

## 2018-09-20 ENCOUNTER — Inpatient Hospital Stay: Payer: Medicare Other

## 2018-09-20 ENCOUNTER — Inpatient Hospital Stay: Payer: Medicare Other | Attending: Hematology and Oncology | Admitting: Urgent Care

## 2018-09-20 ENCOUNTER — Other Ambulatory Visit: Payer: Self-pay | Admitting: *Deleted

## 2018-09-20 VITALS — BP 108/67 | HR 60 | Temp 97.6°F | Resp 16

## 2018-09-20 VITALS — BP 125/81 | HR 70 | Temp 98.2°F | Resp 16 | Wt 152.8 lb

## 2018-09-20 DIAGNOSIS — Z5112 Encounter for antineoplastic immunotherapy: Secondary | ICD-10-CM

## 2018-09-20 DIAGNOSIS — C3431 Malignant neoplasm of lower lobe, right bronchus or lung: Secondary | ICD-10-CM

## 2018-09-20 DIAGNOSIS — Z7189 Other specified counseling: Secondary | ICD-10-CM

## 2018-09-20 DIAGNOSIS — Z7982 Long term (current) use of aspirin: Secondary | ICD-10-CM | POA: Insufficient documentation

## 2018-09-20 DIAGNOSIS — C3411 Malignant neoplasm of upper lobe, right bronchus or lung: Secondary | ICD-10-CM | POA: Insufficient documentation

## 2018-09-20 DIAGNOSIS — Z87891 Personal history of nicotine dependence: Secondary | ICD-10-CM | POA: Diagnosis not present

## 2018-09-20 DIAGNOSIS — Z791 Long term (current) use of non-steroidal anti-inflammatories (NSAID): Secondary | ICD-10-CM

## 2018-09-20 DIAGNOSIS — C7951 Secondary malignant neoplasm of bone: Secondary | ICD-10-CM | POA: Diagnosis not present

## 2018-09-20 DIAGNOSIS — I252 Old myocardial infarction: Secondary | ICD-10-CM | POA: Insufficient documentation

## 2018-09-20 DIAGNOSIS — Z5181 Encounter for therapeutic drug level monitoring: Secondary | ICD-10-CM

## 2018-09-20 DIAGNOSIS — C7972 Secondary malignant neoplasm of left adrenal gland: Secondary | ICD-10-CM

## 2018-09-20 DIAGNOSIS — Z79899 Other long term (current) drug therapy: Secondary | ICD-10-CM | POA: Diagnosis not present

## 2018-09-20 DIAGNOSIS — E46 Unspecified protein-calorie malnutrition: Secondary | ICD-10-CM | POA: Diagnosis not present

## 2018-09-20 DIAGNOSIS — R634 Abnormal weight loss: Secondary | ICD-10-CM | POA: Insufficient documentation

## 2018-09-20 DIAGNOSIS — G893 Neoplasm related pain (acute) (chronic): Secondary | ICD-10-CM

## 2018-09-20 LAB — CBC WITH DIFFERENTIAL/PLATELET
Abs Immature Granulocytes: 0.02 10*3/uL (ref 0.00–0.07)
Basophils Absolute: 0.1 10*3/uL (ref 0.0–0.1)
Basophils Relative: 1 %
Eosinophils Absolute: 0.3 10*3/uL (ref 0.0–0.5)
Eosinophils Relative: 5 %
HCT: 39.8 % (ref 39.0–52.0)
Hemoglobin: 12.8 g/dL — ABNORMAL LOW (ref 13.0–17.0)
Immature Granulocytes: 0 %
Lymphocytes Relative: 12 %
Lymphs Abs: 0.8 10*3/uL (ref 0.7–4.0)
MCH: 27.2 pg (ref 26.0–34.0)
MCHC: 32.2 g/dL (ref 30.0–36.0)
MCV: 84.7 fL (ref 80.0–100.0)
Monocytes Absolute: 0.5 10*3/uL (ref 0.1–1.0)
Monocytes Relative: 8 %
Neutro Abs: 4.6 10*3/uL (ref 1.7–7.7)
Neutrophils Relative %: 74 %
Platelets: 229 10*3/uL (ref 150–400)
RBC: 4.7 MIL/uL (ref 4.22–5.81)
RDW: 13.5 % (ref 11.5–15.5)
WBC: 6.3 10*3/uL (ref 4.0–10.5)
nRBC: 0 % (ref 0.0–0.2)

## 2018-09-20 LAB — COMPREHENSIVE METABOLIC PANEL
ALT: 14 U/L (ref 0–44)
AST: 24 U/L (ref 15–41)
Albumin: 3.7 g/dL (ref 3.5–5.0)
Alkaline Phosphatase: 73 U/L (ref 38–126)
Anion gap: 10 (ref 5–15)
BUN: 13 mg/dL (ref 8–23)
CO2: 25 mmol/L (ref 22–32)
Calcium: 8.8 mg/dL — ABNORMAL LOW (ref 8.9–10.3)
Chloride: 100 mmol/L (ref 98–111)
Creatinine, Ser: 0.78 mg/dL (ref 0.61–1.24)
GFR calc Af Amer: >60 mL/min (ref >60)
GFR calc non Af Amer: >60 mL/min (ref >60)
Glucose, Bld: 138 mg/dL — ABNORMAL HIGH (ref 70–99)
Potassium: 3.8 mmol/L (ref 3.5–5.1)
Sodium: 135 mmol/L (ref 135–145)
Total Bilirubin: 1 mg/dL (ref 0.3–1.2)
Total Protein: 6.9 g/dL (ref 6.5–8.1)

## 2018-09-20 LAB — TSH: TSH: 1.605 u[IU]/mL (ref 0.350–4.500)

## 2018-09-20 MED ORDER — FENTANYL 25 MCG/HR TD PT72: 1.0000 | MEDICATED_PATCH | TRANSDERMAL | 0 refills | Status: DC

## 2018-09-20 MED ORDER — SODIUM CHLORIDE 0.9 % IV SOLN
200.0000 mg | Freq: Once | INTRAVENOUS | Status: AC
  Administered 2018-09-20: 200 mg via INTRAVENOUS
  Filled 2018-09-20: qty 8

## 2018-09-20 MED ORDER — DENOSUMAB 120 MG/1.7ML ~~LOC~~ SOLN
120.0000 mg | Freq: Once | SUBCUTANEOUS | Status: AC
  Administered 2018-09-20: 120 mg via SUBCUTANEOUS
  Filled 2018-09-20: qty 1.7

## 2018-09-20 MED ORDER — HEPARIN SOD (PORK) LOCK FLUSH 100 UNIT/ML IV SOLN
500.0000 [IU] | Freq: Once | INTRAVENOUS | Status: AC
  Administered 2018-09-20: 500 [IU] via INTRAVENOUS

## 2018-09-20 MED ORDER — SODIUM CHLORIDE 0.9% FLUSH
10.0000 mL | INTRAVENOUS | Status: DC | PRN
  Administered 2018-09-20: 10 mL via INTRAVENOUS
  Filled 2018-09-20: qty 10

## 2018-09-20 MED ORDER — SODIUM CHLORIDE 0.9 % IV SOLN
Freq: Once | INTRAVENOUS | Status: AC
  Administered 2018-09-20: 11:00:00 via INTRAVENOUS
  Filled 2018-09-20: qty 250

## 2018-09-20 MED ORDER — OXYCODONE HCL 10 MG PO TABS: 10.0000 mg | ORAL_TABLET | ORAL | 0 refills | Status: DC | PRN

## 2018-09-20 NOTE — Progress Notes (Signed)
Pt here for follow up. Denies any concerns at this time. Reports appetite is still low.

## 2018-09-20 NOTE — Patient Instructions (Signed)
Pembrolizumab injection  What is this medicine?  PEMBROLIZUMAB (pem broe liz ue mab) is a monoclonal antibody. It is used to treat cervical cancer, esophageal cancer, head and neck cancer, hepatocellular cancer, Hodgkin lymphoma, kidney cancer, lymphoma, melanoma, Merkel cell carcinoma, lung cancer, stomach cancer, urothelial cancer, and cancers that have a certain genetic condition.  This medicine may be used for other purposes; ask your health care provider or pharmacist if you have questions.  COMMON BRAND NAME(S): Keytruda  What should I tell my health care provider before I take this medicine?  They need to know if you have any of these conditions:  -diabetes  -immune system problems  -inflammatory bowel disease  -liver disease  -lung or breathing disease  -lupus  -received or scheduled to receive an organ transplant or a stem-cell transplant that uses donor stem cells  -an unusual or allergic reaction to pembrolizumab, other medicines, foods, dyes, or preservatives  -pregnant or trying to get pregnant  -breast-feeding  How should I use this medicine?  This medicine is for infusion into a vein. It is given by a health care professional in a hospital or clinic setting.  A special MedGuide will be given to you before each treatment. Be sure to read this information carefully each time.  Talk to your pediatrician regarding the use of this medicine in children. While this drug may be prescribed for selected conditions, precautions do apply.  Overdosage: If you think you have taken too much of this medicine contact a poison control center or emergency room at once.  NOTE: This medicine is only for you. Do not share this medicine with others.  What if I miss a dose?  It is important not to miss your dose. Call your doctor or health care professional if you are unable to keep an appointment.  What may interact with this medicine?  Interactions have not been studied.  Give your health care provider a list of all the  medicines, herbs, non-prescription drugs, or dietary supplements you use. Also tell them if you smoke, drink alcohol, or use illegal drugs. Some items may interact with your medicine.  This list may not describe all possible interactions. Give your health care provider a list of all the medicines, herbs, non-prescription drugs, or dietary supplements you use. Also tell them if you smoke, drink alcohol, or use illegal drugs. Some items may interact with your medicine.  What should I watch for while using this medicine?  Your condition will be monitored carefully while you are receiving this medicine.  You may need blood work done while you are taking this medicine.  Do not become pregnant while taking this medicine or for 4 months after stopping it. Women should inform their doctor if they wish to become pregnant or think they might be pregnant. There is a potential for serious side effects to an unborn child. Talk to your health care professional or pharmacist for more information. Do not breast-feed an infant while taking this medicine or for 4 months after the last dose.  What side effects may I notice from receiving this medicine?  Side effects that you should report to your doctor or health care professional as soon as possible:  -allergic reactions like skin rash, itching or hives, swelling of the face, lips, or tongue  -bloody or black, tarry  -breathing problems  -changes in vision  -chest pain  -chills  -confusion  -constipation  -cough  -diarrhea  -dizziness or feeling faint or lightheaded  -  fast or irregular heartbeat  -fever  -flushing  -hair loss  -joint pain  -low blood counts - this medicine may decrease the number of white blood cells, red blood cells and platelets. You may be at increased risk for infections and bleeding.  -muscle pain  -muscle weakness  -persistent headache  -redness, blistering, peeling or loosening of the skin, including inside the mouth  -signs and symptoms of high blood sugar  such as dizziness; dry mouth; dry skin; fruity breath; nausea; stomach pain; increased hunger or thirst; increased urination  -signs and symptoms of kidney injury like trouble passing urine or change in the amount of urine  -signs and symptoms of liver injury like dark urine, light-colored stools, loss of appetite, nausea, right upper belly pain, yellowing of the eyes or skin  -sweating  -swollen lymph nodes  -weight loss  Side effects that usually do not require medical attention (report to your doctor or health care professional if they continue or are bothersome):  -decreased appetite  -muscle pain  -tiredness  This list may not describe all possible side effects. Call your doctor for medical advice about side effects. You may report side effects to FDA at 1-800-FDA-1088.  Where should I keep my medicine?  This drug is given in a hospital or clinic and will not be stored at home.  NOTE: This sheet is a summary. It may not cover all possible information. If you have questions about this medicine, talk to your doctor, pharmacist, or health care provider.   2019 Elsevier/Gold Standard (2018-02-18 15:06:10)      Denosumab injection  What is this medicine?  DENOSUMAB (den oh sue mab) slows bone breakdown. Prolia is used to treat osteoporosis in women after menopause and in men, and in people who are taking corticosteroids for 6 months or more. Xgeva is used to treat a high calcium level due to cancer and to prevent bone fractures and other bone problems caused by multiple myeloma or cancer bone metastases. Xgeva is also used to treat giant cell tumor of the bone.  This medicine may be used for other purposes; ask your health care provider or pharmacist if you have questions.  COMMON BRAND NAME(S): Prolia, XGEVA  What should I tell my health care provider before I take this medicine?  They need to know if you have any of these conditions:  -dental disease  -having surgery or tooth extraction  -infection  -kidney  disease  -low levels of calcium or Vitamin D in the blood  -malnutrition  -on hemodialysis  -skin conditions or sensitivity  -thyroid or parathyroid disease  -an unusual reaction to denosumab, other medicines, foods, dyes, or preservatives  -pregnant or trying to get pregnant  -breast-feeding  How should I use this medicine?  This medicine is for injection under the skin. It is given by a health care professional in a hospital or clinic setting.  A special MedGuide will be given to you before each treatment. Be sure to read this information carefully each time.  For Prolia, talk to your pediatrician regarding the use of this medicine in children. Special care may be needed. For Xgeva, talk to your pediatrician regarding the use of this medicine in children. While this drug may be prescribed for children as young as 13 years for selected conditions, precautions do apply.  Overdosage: If you think you have taken too much of this medicine contact a poison control center or emergency room at once.  NOTE: This medicine is   only for you. Do not share this medicine with others.  What if I miss a dose?  It is important not to miss your dose. Call your doctor or health care professional if you are unable to keep an appointment.  What may interact with this medicine?  Do not take this medicine with any of the following medications:  -other medicines containing denosumab  This medicine may also interact with the following medications:  -medicines that lower your chance of fighting infection  -steroid medicines like prednisone or cortisone  This list may not describe all possible interactions. Give your health care provider a list of all the medicines, herbs, non-prescription drugs, or dietary supplements you use. Also tell them if you smoke, drink alcohol, or use illegal drugs. Some items may interact with your medicine.  What should I watch for while using this medicine?  Visit your doctor or health care professional for  regular checks on your progress. Your doctor or health care professional may order blood tests and other tests to see how you are doing.  Call your doctor or health care professional for advice if you get a fever, chills or sore throat, or other symptoms of a cold or flu. Do not treat yourself. This drug may decrease your body's ability to fight infection. Try to avoid being around people who are sick.  You should make sure you get enough calcium and vitamin D while you are taking this medicine, unless your doctor tells you not to. Discuss the foods you eat and the vitamins you take with your health care professional.  See your dentist regularly. Brush and floss your teeth as directed. Before you have any dental work done, tell your dentist you are receiving this medicine.  Do not become pregnant while taking this medicine or for 5 months after stopping it. Talk with your doctor or health care professional about your birth control options while taking this medicine. Women should inform their doctor if they wish to become pregnant or think they might be pregnant. There is a potential for serious side effects to an unborn child. Talk to your health care professional or pharmacist for more information.  What side effects may I notice from receiving this medicine?  Side effects that you should report to your doctor or health care professional as soon as possible:  -allergic reactions like skin rash, itching or hives, swelling of the face, lips, or tongue  -bone pain  -breathing problems  -dizziness  -jaw pain, especially after dental work  -redness, blistering, peeling of the skin  -signs and symptoms of infection like fever or chills; cough; sore throat; pain or trouble passing urine  -signs of low calcium like fast heartbeat, muscle cramps or muscle pain; pain, tingling, numbness in the hands or feet; seizures  -unusual bleeding or bruising  -unusually weak or tired  Side effects that usually do not require medical  attention (report to your doctor or health care professional if they continue or are bothersome):  -constipation  -diarrhea  -headache  -joint pain  -loss of appetite  -muscle pain  -runny nose  -tiredness  -upset stomach  This list may not describe all possible side effects. Call your doctor for medical advice about side effects. You may report side effects to FDA at 1-800-FDA-1088.  Where should I keep my medicine?  This medicine is only given in a clinic, doctor's office, or other health care setting and will not be stored at home.  NOTE: This sheet

## 2018-09-20 NOTE — Progress Notes (Signed)
[facility] day:  09/20/2018   Chief Complaint: Jimmy West is a 68 y.o. male with metastatic adenocarcinoma of the lung who is seen for review of interval chest CT and assessment prior to cycle #6 pembrolizumab.  HPI:  The patient was last seen in the medical oncology clinic on 08/30/2018.  At that time, patient complained of back pain and fatigue.  Appetite was poor; weight down 3 pounds.  No B symptoms or recent infections.  Exam revealed tenderness patient's thoracic spine.  There is a 6300 ([place] 4600).  Patient received cycle #5 pembrolizumab on 08/30/2018.   He was seen in consult on 09/06/2018 by Dr. Cheryll Cockayne ([facility]).  Notes reviewed.  It is a progression of his pathologic fracture of the T4 vertebral body, surgical intervention was recommended.  Discussed short segment percutaneous procedure vs. a decompressive procedure with fusion.  Patient remains undecided about surgical intervention at this time.  Plans are for repeat CT imaging in 3 months (April) to determine if there is any further progression of patient's metastatic disease.   He was seen in follow-up consult on 09/09/2018 by [name], NP (palliative care medicine).  Notes reviewed.  Patient verbalized concerns related to the need for possible surgery at [facility].  He makes mention of a "bone fragment" that is causing concern for possible cord impingement.  Pain regimen was discussed at length.  Patient continued on the following regimen:  MS Contin 30 mg twice daily  Roxicodone 10 to 20 mg every 4 to 6 hours PRN.   Ibuprofen 600 mg every 6 hours PRN (daily PPI recommended for gastric ulcer prophylaxis).  Patient scheduled to follow-up with palliative care medicine provider in 1 month to address ACP documents, MOST form completion, and to reevaluate pain regimen.  Symptomatically, patient presents today with continued pain in his back.  He notes that the  pain is worsening despite prescribed interventions.  Patient has been seen in consult at [facility] and surgery has been recommended.  Of note, patient advises that he was told that he was not a candidate for kyphoplasty, and that he would have to have a "metal cage" placed in his spine.  Patient apprehensive about surgical course citing the fact that he cannot afford to be out of work as long as is required for the procedure and recovery.  He advises that his pain has generally been when he would "overdo it at work", however now his pain is associated with sitting, standing, and with dressing. He retains the ability to complete all of his ADLs independently at this point, however his stamina is decreased. In addition to his back pain, patient complains of pain the lateral aspects of his ribs BILATERAL that exacerbates with deep inspiration.  Patient denies that he has experienced any B symptoms. He denies any interval infections.  He denies any nausea, vomiting, or changes to his bowel habits.  He has not experienced any increased shortness of breath, cough, or hemoptysis related to his underlying lung malignancy.  Patient advises that he maintains a decreased appetite. He is not eating well. Weight today is 152 lb 12.5 oz (69.3 kg), which compared to his last visit to the clinic, represents a 2 pound decrease.  Patient utilized supplements provided during last visit, however he describes the vanilla flavor as being not palatable.  He continues [employer] civil (consulting) shakes daily for the protein content (160 calories and 30 grams protein).  Past Medical History:  Diagnosis Date  . Bulging lumbar disc   . Cancer ([place])   . Heart attack [facility])     Past Surgical History:  Procedure Laterality Date  . CARDIAC CATHETERIZATION     two stents  . KNEE SURGERY Left   . PORTA CATH INSERTION N/A 05/21/2018   Procedure: PORTA CATH INSERTION;  Surgeon: Algernon Huxley, MD;  Location: [place] [facility];  Service:  Cardiovascular;  Laterality: N/A;    Family History  Problem Relation Age of Onset  . Heart failure Father   . Cancer Maternal Aunt   . Heart failure Maternal Uncle   . Dementia Paternal Grandmother   . Cancer Maternal Aunt     Social History:  reports that he quit smoking about 14 years ago. His smoking use included cigarettes. He has a 22.50 pack-year smoking history. He has never used smokeless tobacco. He reports current alcohol use of about 15.0 standard drinks of alcohol per week. He reports that he does not use drugs. He started smoking at age 37.   He is smoking 1 1/2 packs/day.  Patient is employed as as full time Probation officer. Patient denies known exposures to radiation on toxins. The patient is accompanied by a woman today.  Allergies: No Known Allergies  Current Medications: Current Outpatient Medications  Medication Sig Dispense Refill  . acetaminophen (TYLENOL) 500 MG tablet Take 500 mg by mouth 3 (three) times daily as needed.    Marland Kitchen aspirin EC 81 MG tablet Take 81 mg by mouth daily.    . Calcium 600-400 MG-UNIT CHEW Chew 2 tablets by mouth daily.    Marland Kitchen ibuprofen (ADVIL,MOTRIN) 200 MG tablet Take 200 mg by mouth every 6 (six) hours as needed.    . lidocaine-prilocaine (EMLA) cream Apply to affected area once (Patient not taking: Reported on 08/30/2018) 30 g 3  . morphine (MS CONTIN) 30 MG 12 hr tablet Take 1 tablet (30 mg total) by mouth every 12 (twelve) hours. 60 tablet 0  . naloxone (NARCAN) nasal spray 4 mg/0.1 mL 1 spray by nose in the event of opioid overdose. Call EMS (911) immediately if medication is used. (Patient not taking: Reported on 08/30/2018) 1 kit 0  . ondansetron (ZOFRAN) 8 MG tablet Take 1 tablet (8 mg total) by mouth 2 (two) times daily as needed (Nausea or vomiting). Start if needed on the third day after chemotherapy. (Patient not taking: Reported on 08/30/2018) 30 tablet 1  . Oxycodone HCl 10 MG TABS Take 1 tablet (10 mg total) by mouth every 4 (four) hours  as needed. 60 tablet 0  . polyethylene glycol (MIRALAX / GLYCOLAX) packet Take 17 g by mouth daily.    Marland Kitchen senna (SENOKOT) 8.6 MG tablet Take 1 tablet by mouth as needed.      No current facility-administered medications for this visit.     Review of Systems  Constitutional: Positive for malaise/fatigue and weight loss (down 2 pounds). Negative for diaphoresis and fever.  HENT: Negative.   Eyes: Negative.   Respiratory: Negative for cough, hemoptysis, sputum production and shortness of breath.   Cardiovascular: Negative for chest pain, palpitations, orthopnea, leg swelling and PND.  Gastrointestinal: Negative for abdominal pain, blood in stool, constipation, diarrhea, melena, nausea and vomiting.  Genitourinary: Negative for dysuria, frequency, hematuria and urgency.  Musculoskeletal: Positive for back pain (2/2 metastasis to T4 vertebral body). Negative for falls, joint pain and myalgias.       BILATERAL rib pain  with deep inspiration  Skin: Negative for itching and rash.  Neurological: Positive for sensory change (chronic numbness to LEFT heel 2/2 spinal disc issues). Negative for dizziness, tremors, weakness and headaches.  Endo/Heme/Allergies: Does not bruise/bleed easily.  Psychiatric/Behavioral: Negative for depression, memory loss and suicidal ideas. The patient is nervous/anxious. The patient does not have insomnia.   All other systems reviewed and are negative.  Performance status (ECOG): 1 - Symptomatic but completely ambulatory  Vital Signs BP 125/81 (BP Location: Left Arm, Patient Position: Sitting)   Pulse 70   Temp 98.2 F (36.8 C) (Oral)   Resp 16   Wt 152 lb 12.5 oz (69.3 kg)   SpO2 98%   BMI 23.93 kg/m   Physical Exam  Constitutional: He is oriented to person, place, and time and well-developed, well-nourished, and in no distress.  HENT:  Head: Normocephalic and atraumatic.  Mouth/Throat: Oropharynx is clear and moist and mucous membranes are normal. He has  dentures.  Eyes: Pupils are equal, round, and reactive to light. EOM are normal. No scleral icterus.  Neck: Normal range of motion. Neck supple. No tracheal deviation present. No thyromegaly present.  Cardiovascular: Normal rate, regular rhythm, normal heart sounds and intact distal pulses. Exam reveals no gallop and no friction rub.  No murmur heard. Pulmonary/Chest: Effort normal and breath sounds normal. No respiratory distress. He has no wheezes. He has no rales.  Pain to BILATERAL ribs with deep inspiration  Abdominal: Soft. Bowel sounds are normal. He exhibits no distension. There is no abdominal tenderness.  Musculoskeletal: Normal range of motion.        General: No edema.     Thoracic back: He exhibits tenderness (known T4 vertebral body metastasis).  Lymphadenopathy:    He has no cervical adenopathy.    He has no axillary adenopathy.       Right: No inguinal and no supraclavicular adenopathy present.       Left: No inguinal and no supraclavicular adenopathy present.  Neurological: He is alert and oriented to person, place, and time.  Skin: Skin is warm and dry. No rash noted. No erythema.  Psychiatric: Mood, affect and judgment normal.  Nursing note and vitals reviewed.   Infusion on 09/20/2018  Component Date Value Ref Range Status  . Sodium 09/20/2018 135  135 - 145 mmol/L Final  . Potassium 09/20/2018 3.8  3.5 - 5.1 mmol/L Final  . Chloride 09/20/2018 100  98 - 111 mmol/L Final  . CO2 09/20/2018 25  22 - 32 mmol/L Final  . Glucose, Bld 09/20/2018 138* 70 - 99 mg/dL Final  . BUN 09/20/2018 13  8 - 23 mg/dL Final  . Creatinine, Ser 09/20/2018 0.78  0.61 - 1.24 mg/dL Final  . Calcium 09/20/2018 8.8* 8.9 - 10.3 mg/dL Final  . Total Protein 09/20/2018 6.9  6.5 - 8.1 g/dL Final  . Albumin 09/20/2018 3.7  3.5 - 5.0 g/dL Final  . AST 09/20/2018 24  15 - 41 U/L Final  . ALT 09/20/2018 14  0 - 44 U/L Final  . Alkaline Phosphatase 09/20/2018 73  38 - 126 U/L Final  . Total  Bilirubin 09/20/2018 1.0  0.3 - 1.2 mg/dL Final  . GFR calc non Af Amer 09/20/2018 >60  >60 mL/min Final  . GFR calc Af Amer 09/20/2018 >60  >60 mL/min Final  . Anion gap 09/20/2018 10  5 - 15 Final   Performed at [facility], [address], [place], [place] [address]  .  WBC 09/20/2018 6.3  4.0 - 10.5 K/uL Final  . RBC 09/20/2018 4.70  4.22 - 5.81 MIL/uL Final  . Hemoglobin 09/20/2018 12.8* 13.0 - 17.0 g/dL Final  . HCT 09/20/2018 39.8  39.0 - 52.0 % Final  . MCV 09/20/2018 84.7  80.0 - 100.0 fL Final  . MCH 09/20/2018 27.2  26.0 - 34.0 pg Final  . MCHC 09/20/2018 32.2  30.0 - 36.0 g/dL Final  . RDW 09/20/2018 13.5  11.5 - 15.5 % Final  . Platelets 09/20/2018 229  150 - 400 K/uL Final  . nRBC 09/20/2018 0.0  0.0 - 0.2 % Final  . Neutrophils Relative % 09/20/2018 74  % Final  . Neutro Abs 09/20/2018 4.6  1.7 - 7.7 K/uL Final  . Lymphocytes Relative 09/20/2018 12  % Final  . Lymphs Abs 09/20/2018 0.8  0.7 - 4.0 K/uL Final  . Monocytes Relative 09/20/2018 8  % Final  . Monocytes Absolute 09/20/2018 0.5  0.1 - 1.0 K/uL Final  . Eosinophils Relative 09/20/2018 5  % Final  . Eosinophils Absolute 09/20/2018 0.3  0.0 - 0.5 K/uL Final  . Basophils Relative 09/20/2018 1  % Final  . Basophils Absolute 09/20/2018 0.1  0.0 - 0.1 K/uL Final  . Immature Granulocytes 09/20/2018 0  % Final  . Abs Immature Granulocytes 09/20/2018 0.02  0.00 - 0.07 K/uL Final   Performed at [facility], [address] [place], [place] [address]  . T4, Total 09/20/2018 8.8  4.5 - 12.0 ug/dL Final   Comment: (NOTE) Performed At: [facility] [place], [place] [id number] Rush Farmer MD PP:[id number]   . TSH 09/20/2018 1.605  0.350 - 4.500 uIU/mL Final   Comment: Performed by a 3rd Generation assay with a functional sensitivity of <=0.01 uIU/mL. Performed at [facility], [address] [place], [place] [address]     Assessment:  Justan Gaede  is a 68 y.o. 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  Pathology revealed an invasive high-grade adenocarcinoma, with predominantly solid growth pattern.  The neoplastic cells were TTF-1 (+), Napsin A (+), and P40 (+).  He has a T4 vertebral metastasis.  Clinical stage is T4N1M1.  There was not enough material for Foundation One testing.  PDL-1 revealed TPS 90%.  Chest CT angiogram on 04/27/2018 revealed no evidence of significant pulmonary embolus.  There was a 4.1 cm mass in the superior segment right lower lobe posteriorly causing obliteration of the in coming bronchus and mild postobstructive change. There were multiple additional bilateral pulmonary nodules (< 1 cm - 1.4 cm).   There were enlarged right hilar lymph nodes.  There was metastasis to the T4 vertebra with mild associated compression.  There was emphysematous changes in the lungs.   PET scan on 04/30/2018 revealed a 3.4 cm hypermetabolic RLL pulmonary mass (SUV 13.2), 11 mm pulmonary nodule in the LEFT lung (SUV 4.5), RIGHT paratracheal lymph node (SUV 5.1), and hypermetabolic activity within the T4 vertebral body (SUV 10.2).   Thoracic spine MRI on 05/03/2018 revealed T4 metastasis with a 40% pathologic compression deformity and 5 mm of retropulsion of the vertebral body.  Retropulsion results in mild spinal canal stenosis and mild bilateral C4-5 foraminal stenosis.  There was paravertebral soft tissue thickening from mid T3 to mid T5, likely representing edema related to the pathologic compression deformity vs. possible extraosseous extension of the neoplasm.  There were  no additional thoracic spinal metastases noted.   Head MRI on 05/03/2018 revealed no intracranial metastatic disease.  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  Incidental mention made of mild paranasal sinus disease.  He is s/p cycle #5 pembrolizumab (05/24/2018 - 08/30/2018).  He is tolerating treatment well.  He completed T4 radiation on 07/07/2018.  He receives Xgeva monthly (06/03/2018 - 08/09/2018).  Thoracic spine CT at [facility] on 08/17/2018 revealed pathologic compression fracture of T4 resulting in vertebra plana. Fracture fragments protrude into the spinal canal resulting in moderate to severe canal narrowing at T4.  There was severe right and moderate left foraminal narrowing at T4-T5 secondary to vertebra plana.  There were multiple spiculated pulmonary masses consistent with metastatic adenocarcinoma, similar in appearance to April 30, 2018 PET/CT.  Chest CT on 08/26/2018 revealed a 3.1 cm right lower lobe mass, corresponding to known primary bronchogenic neoplasm, mildly decreased.  Bilateral pulmonary nodules/metastases, most of which are improved.  There was mildly progressive pleural-based nodularity in the right upper lobe along the minor fissure, including a dominant 17 x 11 mm nodule.  There was a small right pleural effusion, decreased.  There was a 15 mm left adrenal metastasis, decreased.    Symptomatically, patient is fatigued and complaining of increased pain despite current interventions.  He complains of pain in his back and bilateral ribs.  No shortness of breath or cough.  Appetite remains poor; down 2 pounds.  No B symptoms or recent infections.  Exam reveals reproducible tenderness to bilateral ribs with deep inspiration, and persistent tenderness to thoracic spine.  WBC 6300 ([place] 4600).   Plan: 1. Labs today:  CBC with diff, CMP, TSH, CEA. 2. Metastatic high-grade adenocarcinoma of the RIGHT lung  Doing well overall.  Tolerating treatments well with minimal side effects.  Currently s/p 5 cycles of palliative immunotherapy (pembrolizumab).  Discuss concerns related to increased risk of  contracting respiratory illnesses  Patient anxious about news stories related to "pandemic" community spread of novel coronavirus (COVID-19).   Encouraged to avoid large crowds, wear face masks while in public, and to practice stringent handwashing.   Labs reviewed. Blood counts stable and adequate enough for treatment. Will proceed with cycle #6 pembrolizumab. 3. T4 osseous metastasis  Has been seen in consult at [facility] - surgical intervention has been recommended.  Discussed recent imaging - concern for "bone fragment" causing his pain to increase.   Patient remains undecided due to fear of the procedure, as well as the amount of associated recovery time.  Plans are for repeat CT imaging in 10/2018, at which time definitive plans will be made based on whether or not progression is noted.   Radiation therapy has completed, however patient unable to appreciate any palliative benefits - pain worsening.  Will continue monthly  denosumab (due today).  Encouraged to continue calcium 1200 mg and vitamin D 800 IU daily. 4. Pain and symptom management  Has antiemetics at home to use on a PRN basis, and advises that the prescribed interventions are adequately managing his symptoms at this point.   Pain worsening despite current interventions.  Discussed patient with palliative care provider ([place], NP).   Previously discussed changing LAO (MS Contin) to TID dosing schedule, however patient notes this would be a burden for him as he would have to "wake up to take medication.  Patient currently taking second dose of MS Contin "early" due to increased pain.   He is using Roxicodone 10 to 20 mg q 4-6h PRN for BTP.  Recommendations were to DISCONTINUE patient's LAO (MS-Contin) and switch to transdermal fentanyl patch, keeping the SAO (Roxicodone) for as needed BTP. [place]-CSRS checked prior to opioid prescriptions. Given a current oncology diagnosis, this patient has the potential to experience  significant cancer related pain. Benefits versus risks associated with continued therapy considered. Will continue pain management with opioids as previously prescribed. Patient educated that medications should not be bitten, chewed, or crushed. Additionally, safety precautions reviewed. Patient verbalized understanding that medications should not be sold or shared, taken with alcohol, or used while driving. He has been made aware of the side effects of using this medication. Patient understands that this medication can cause CNS depression, increase his risk of falls, and even lead to overdose that results in death, if used outside of the parameters that he and I discussed. With all of this in mind, he accepts the risks and responsibilities associated with therapy and elects to continue to use the prescribed interventions. Patient has been prescribed naloxone to have on hand in the even of unintentional opioid overdose. Prescriptions sent in for: Duragesic 25 mcg patch q72 hours (Disp #5) Roxicodone 10 q4h PRN (Disp #30) 5. Protein calorie malnutrition  His weight today is 152 lb 12.5 oz (69.3 kg). His BMI of 23.93 kg/m places him in the normal weight category.   Encouraged him to increase his intake of calorie and protein dense food choices.   Encouraged to utilize nutritional supplement shakes at least 2-3 times a day.   Samples provided of different flavors of supplement shakes.   Boost Plus provides: 360 calories, 14 g fat, 45 g carbohydrates, and 14 g protein  Ensure Enlive provides: 350 calories, 11 g fat, 44 g carbohydrates, and 20 g protein  Patient referred to cancer center RD for official assessment and to discuss strategies that will help him prevent further weight loss.  6. RTC on 10/11/2018 for MD assessment, labs (CBC with diff, CMP, TSH), and cycle #7 pembrolizumab.   Honor Loh, NP  09/20/2018, 11:02 AM

## 2018-09-20 NOTE — Telephone Encounter (Signed)
Pt requesting refill of oxycodone and ms contin to be sent into [facility].

## 2018-09-21 LAB — T4: T4, Total: 8.8 ug/dL (ref 4.5–12.0)

## 2018-09-24 ENCOUNTER — Other Ambulatory Visit: Payer: Self-pay | Admitting: Hospice and Palliative Medicine

## 2018-09-24 MED ORDER — FENTANYL 50 MCG/HR TD PT72: 1.0000 | MEDICATED_PATCH | TRANSDERMAL | 0 refills | Status: DC

## 2018-09-24 MED ORDER — OXYCODONE HCL 10 MG PO TABS: 10.0000 mg | ORAL_TABLET | ORAL | 0 refills | Status: DC | PRN

## 2018-09-24 NOTE — Progress Notes (Signed)
I spoke with patient by phone. He has continued to have severe pain unrelieved with rotation to fentanyl. He has been taking two 10mg  oxycodone tablets every 4 hours, which helps some. Patient denies any adverse effects from pain medications. Will increase fentanyl to 72mcg. Patient may use two 86mcg patches to equal 67mcg dose.   We discussed the possibility of him having an operative repair of the spinal lesion. Patient says he is trying to save up enough money to have the surgery.  Plan: Increase fentanyl to 55mcg Q72H (Rx #5) Increase oxycodone 10mg  to 1-2 tablets every 4 hours as needed for BTP Recommend follow up with [facility]

## 2018-09-27 ENCOUNTER — Ambulatory Visit: Payer: Medicare Other

## 2018-09-27 ENCOUNTER — Other Ambulatory Visit: Payer: Self-pay

## 2018-09-27 ENCOUNTER — Other Ambulatory Visit: Payer: Medicare Other

## 2018-09-27 ENCOUNTER — Ambulatory Visit: Payer: Medicare Other | Admitting: Hematology and Oncology

## 2018-09-27 ENCOUNTER — Inpatient Hospital Stay: Payer: Medicare Other | Attending: Hematology and Oncology

## 2018-09-27 DIAGNOSIS — C3431 Malignant neoplasm of lower lobe, right bronchus or lung: Secondary | ICD-10-CM | POA: Insufficient documentation

## 2018-09-27 DIAGNOSIS — Z515 Encounter for palliative care: Secondary | ICD-10-CM | POA: Insufficient documentation

## 2018-09-27 DIAGNOSIS — G893 Neoplasm related pain (acute) (chronic): Secondary | ICD-10-CM | POA: Insufficient documentation

## 2018-09-27 NOTE — Progress Notes (Signed)
Nutrition Assessment   Reason for Assessment:   Weight loss   ASSESSMENT:  68 year old male with metastatic  adenocarcinoma of the lung.  Patient receiving pembrolizumab.  Past medical history of heart attack. Noted has been seen at [facility] and recommending surgery for progression to T4 vertebra causing pain.   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  Reports that he does not have much of an appetite, eating less than typically eats.  Reports some issues with pain and not sure if that is effecting intake or not.  Also some issues with constipation but fairly controlled.  Reports that has never been a big breakfast eater.  Sometimes will get a biscuit from [employer] before going to work or drink ensure/boost shake.  Reports that he snacks (peanut butter and crackers, trail mix, chex mix) during the day. 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 Reports that typically he has always eaten a good meal for dinner. Reports some issues at times with swallowing due to irritation from radiation.  This has improved.  Does not restrict foods due to this.  Mostly drinks water and coffee.  Reports he can taste sweeter foods at this time.  Finds it challenging to cook or prepare meals for one.  Often does not want to go out to eat because does not want to spend the money and then not eat the meal.  Nutrition Focused Physical Exam: deferred today   Medications: calcium and vit D, miralax, senna   Labs: glucose 138   Anthropometrics:   Height: 67 inches Weight: 152 lb 12.5 oz 3/2 UBW: 160-165 lb.  Noted 160 lb on 07/05/2018  BMI: 23  8% weight loss in the last 4 months, concerning but not significant   Estimated Energy Needs  Kcals: 2000-2400 calories Protein: 100-120 g Fluid: > 2 L   NUTRITION DIAGNOSIS: Unintentional weight loss related to cancer, pain, some constipation as evidenced by 8% weight loss in the last 4  months  MALNUTRITION DIAGNOSIS: continue to  monitor   INTERVENTION:  Discussed importance of good nutrition. Discussed strategies to increase calories and protein with poor appetite and decreased volume of food eaten currently.  Encouraged patient to continue ensure plus, ensure enlive, boost plus or equivalent.  Gave patient 1st case of ensure enlive today along with coupons. Talked about easy to prepare foods high in calories and protein to prepare for one.   Encouraged eating q 2 hours (snack).  Patient to include protein food with each meal/snack.   Contact information given    MONITORING, EVALUATION, GOAL: weight trends, intake   Next Visit: April 6 for follow-up in [place]. Zenia Resides, [place], [place] Registered Dietitian [phone] (pager)

## 2018-10-05 ENCOUNTER — Other Ambulatory Visit: Payer: Self-pay | Admitting: *Deleted

## 2018-10-05 MED ORDER — OXYCODONE HCL 10 MG PO TABS: 10.0000 mg | ORAL_TABLET | ORAL | 0 refills | Status: DC | PRN

## 2018-10-08 ENCOUNTER — Other Ambulatory Visit: Payer: Self-pay

## 2018-10-10 ENCOUNTER — Other Ambulatory Visit: Payer: Self-pay

## 2018-10-10 DIAGNOSIS — E441 Mild protein-calorie malnutrition: Secondary | ICD-10-CM | POA: Insufficient documentation

## 2018-10-10 NOTE — Progress Notes (Signed)
[facility] [address], [place], [place] [address] Phone: [phone]  Fax: [phone]    Clinic day:  10/11/2018   Chief Complaint: Jimmy West is a 68 y.o. male with metastatic adenocarcinoma of the lung who is seen for 3 week assessment prior to cycle #7 pembrolizumab.  HPI:  The patient was last seen in the medical oncology clinic on 09/20/2018 by Honor Loh, NP.  At that time, patient was fatigued and complaining of increased pain despite current interventions.  He complained of pain in his back and bilateral ribs.  He denied any shortness of breath or cough.  Appetite remained poor; weight was down 2 pounds.  He denied any B symptoms or recent infections.  Exam revealed reproducible tenderness to bilateral ribs with deep inspiration, and persistent tenderness to thoracic spine.  WBC was 6300 ([place] 4600).  [facility] consultation with Dr Loma Sousa recommended surgical intervention regarding his pathologic fracture of the T4 vertebral body.  She discussed short segment percutaneous procedure vs. a decompressive procedure with fusion. He remained undecided about surgical intervention.   He received cycle #6 pembrolizumab and Xegva.  MSContin was switched to Fentanyl 25 mcg/hr.  He continued oxycodone 10 -20 mg q4h PRN pain.  He spoke with [name], NP on the phone on 09/20/2018.  Pain was severe.  He was taking two 10 mg oxycodone tablets every 4 hours.  Fentanyl was increased to 50 mcg/hr.  He met with Jennet Maduro, registered dietician, on 09/27/2018.  Notes reviewed.  She discussed strategies to increase calories and protein with poor appetite. She encouraged him to continue ensure plus, ensure enlive, boost plus or equivalent.  She gave him a case of ensure enlive today along with coupons.  She talked about easy to prepare foods high in calories and protein to prepare for one.  She encouraged him to snack every 2 hours.  She has a follow-up  appointment on 10/25/2018.  During the interim, patient is doing well. He denies any acute complaints. Patient feels generally well. His pain is well controlled on his current regimen. The pain in his ribs has fully resolved. Patient is not working at this time due to COVID-19 restrictions, which has also helped with his pain. He continues to have numbness in his LEFT heel related to known spinal disc disease.   Patient advises that he maintains an adequate appetite. He is eating well. Weight today is 159 lb 9.8 oz (72.4 kg), which compared to his last visit to the clinic, represents a  7 pound increase. Patient is utilizing the recommended supplement shakes to help with his appetite.   Patient denies pain in the clinic today.   Past Medical History:  Diagnosis Date   Bulging lumbar disc    Cancer ([place])    Heart attack ([place])     Past Surgical History:  Procedure Laterality Date   CARDIAC CATHETERIZATION     two stents   KNEE SURGERY Left    PORTA CATH INSERTION N/A 05/21/2018   Procedure: PORTA CATH INSERTION;  Surgeon: Algernon Huxley, MD;  Location: [facility];  Service: Cardiovascular;  Laterality: N/A;    Family History  Problem Relation Age of Onset   Heart failure Father    Cancer Maternal Aunt    Heart failure Maternal Uncle    Dementia Paternal Grandmother    Cancer Maternal Aunt     Social History:  reports that he quit smoking about 14 years ago.  His smoking use included cigarettes. He has a 22.50 pack-year smoking history. He has never used smokeless tobacco. He reports current alcohol use of about 15.0 standard drinks of alcohol per week. He reports that he does not use drugs. He started smoking at age 80.   He is smoking 1 1/2 packs/day.  Patient is employed as as full time Probation officer. Patient denies known exposures to radiation on toxins. The patient is alone today.  Allergies: No Known Allergies  Current Medications: Current Outpatient  Medications  Medication Sig Dispense Refill   acetaminophen (TYLENOL) 500 MG tablet Take 500 mg by mouth 3 (three) times daily as needed.     aspirin EC 81 MG tablet Take 81 mg by mouth daily.     Calcium 600-400 MG-UNIT CHEW Chew 2 tablets by mouth daily.     fentaNYL (DURAGESIC) 50 MCG/HR Place 1 patch onto the skin every 3 (three) days. 5 patch 0   ibuprofen (ADVIL,MOTRIN) 200 MG tablet Take 200 mg by mouth every 6 (six) hours as needed.     lidocaine-prilocaine (EMLA) cream Apply to affected area once 30 g 3   Oxycodone HCl 10 MG TABS Take 1-2 tablets (10-20 mg total) by mouth every 4 (four) hours as needed. 90 tablet 0   pembrolizumab (KEYTRUDA) 100 MG/4ML SOLN Inject 2 mg/kg into the vein every 30 (thirty) days.      polyethylene glycol (MIRALAX / GLYCOLAX) packet Take 17 g by mouth daily.     senna (SENOKOT) 8.6 MG tablet Take 1 tablet by mouth as needed.      naloxone (NARCAN) nasal spray 4 mg/0.1 mL 1 spray by nose in the event of opioid overdose. Call EMS (911) immediately if medication is used. (Patient not taking: Reported on 09/20/2018) 1 kit 0   ondansetron (ZOFRAN) 8 MG tablet Take 1 tablet (8 mg total) by mouth 2 (two) times daily as needed (Nausea or vomiting). Start if needed on the third day after chemotherapy. (Patient not taking: Reported on 09/20/2018) 30 tablet 1   No current facility-administered medications for this visit.    Facility-Administered Medications Ordered in Other Visits  Medication Dose Route Frequency Provider Last Rate Last Dose   heparin lock flush 100 unit/mL  500 Units Intravenous Once Bryah Ocheltree C, MD       sodium chloride flush (NS) 0.9 % injection 10 mL  10 mL Intravenous PRN Lequita Asal, MD   10 mL at 10/11/18 0915    Review of Systems  Constitutional: Positive for malaise/fatigue. Negative for diaphoresis, fever and weight loss (appetite improved; up 7 pounds).       "I am doing well".  HENT: Negative.   Eyes:  Negative.   Respiratory: Negative for cough, hemoptysis, sputum production and shortness of breath.   Cardiovascular: Negative for chest pain, palpitations, orthopnea, leg swelling and PND.  Gastrointestinal: Negative for abdominal pain, blood in stool, constipation, diarrhea, melena, nausea and vomiting.  Genitourinary: Negative for dysuria, frequency, hematuria and urgency.  Musculoskeletal: Positive for back pain (2/2 metastasis to T4 vertebral body). Negative for falls, joint pain and myalgias.  Skin: Negative for itching and rash.  Neurological: Positive for sensory change (chronic numbness to LEFT heel 2/2 spinal disc issues). Negative for dizziness, tremors, weakness and headaches.  Endo/Heme/Allergies: Does not bruise/bleed easily.  Psychiatric/Behavioral: Negative for depression, memory loss and suicidal ideas. The patient is nervous/anxious. The patient does not have insomnia.   All other systems reviewed and are negative.  Performance status (ECOG): 1 - Symptomatic but completely ambulatory  Vital Signs BP 111/80 (BP Location: Left Arm, Patient Position: Sitting)    Pulse 76    Temp 98.6 F (37 C) (Tympanic)    Resp 16    Wt 159 lb 9.8 oz (72.4 kg)    SpO2 96%    BMI 25.00 kg/m   Physical Exam  Constitutional: He is oriented to person, place, and time and well-developed, well-nourished, and in no distress. No distress.  HENT:  Head: Normocephalic and atraumatic.  Mouth/Throat: Oropharynx is clear and moist and mucous membranes are normal. He has dentures. No oropharyngeal exudate.  Brown hair with slight graying.  Mustache.  Eyes: Pupils are equal, round, and reactive to light. Conjunctivae and EOM are normal. No scleral icterus.  Neck: Normal range of motion. Neck supple. No JVD present.  Cardiovascular: Normal rate, regular rhythm, normal heart sounds and intact distal pulses. Exam reveals no gallop and no friction rub.  No murmur heard. Pulmonary/Chest: Effort normal and  breath sounds normal. No respiratory distress. He has no wheezes. He has no rales.  Abdominal: Soft. Bowel sounds are normal. He exhibits no distension and no mass. There is no abdominal tenderness. There is no rebound and no guarding.  Musculoskeletal: Normal range of motion.        General: No edema.     Thoracic back: He exhibits tenderness (known T4 vertebral body metastasis).  Lymphadenopathy:    He has no cervical adenopathy.    He has no axillary adenopathy.       Right: No supraclavicular adenopathy present.       Left: No supraclavicular adenopathy present.  Neurological: He is alert and oriented to person, place, and time. Gait normal.  Skin: Skin is warm and dry. No rash noted. He is not diaphoretic. No erythema. No pallor.  Psychiatric: Mood, affect and judgment normal.  Nursing note and vitals reviewed.   Infusion on 10/11/2018  Component Date Value Ref Range Status   Sodium 10/11/2018 136  135 - 145 mmol/L Final   Potassium 10/11/2018 4.1  3.5 - 5.1 mmol/L Final   Chloride 10/11/2018 102  98 - 111 mmol/L Final   CO2 10/11/2018 27  22 - 32 mmol/L Final   Glucose, Bld 10/11/2018 135* 70 - 99 mg/dL Final   BUN 10/11/2018 16  8 - 23 mg/dL Final   Creatinine, Ser 10/11/2018 0.81  0.61 - 1.24 mg/dL Final   Calcium 10/11/2018 9.0  8.9 - 10.3 mg/dL Final   Total Protein 10/11/2018 7.1  6.5 - 8.1 g/dL Final   Albumin 10/11/2018 3.7  3.5 - 5.0 g/dL Final   AST 10/11/2018 26  15 - 41 U/L Final   ALT 10/11/2018 19  0 - 44 U/L Final   Alkaline Phosphatase 10/11/2018 91  38 - 126 U/L Final   Total Bilirubin 10/11/2018 0.5  0.3 - 1.2 mg/dL Final   GFR calc non Af Amer 10/11/2018 >60  >60 mL/min Final   GFR calc Af Amer 10/11/2018 >60  >60 mL/min Final   Anion gap 10/11/2018 7  5 - 15 Final   Performed at [facility], [address] [place], [place] [address]   WBC 10/11/2018 7.6  4.0 - 10.5 K/uL Final   RBC 10/11/2018 4.63  4.22 - 5.81 MIL/uL  Final   Hemoglobin 10/11/2018 13.0  13.0 - 17.0 g/dL Final   HCT 10/11/2018 39.8  39.0 - 52.0 % Final   MCV  10/11/2018 86.0  80.0 - 100.0 fL Final   MCH 10/11/2018 28.1  26.0 - 34.0 pg Final   MCHC 10/11/2018 32.7  30.0 - 36.0 g/dL Final   RDW 10/11/2018 14.9  11.5 - 15.5 % Final   Platelets 10/11/2018 229  150 - 400 K/uL Final   nRBC 10/11/2018 0.0  0.0 - 0.2 % Final   Neutrophils Relative % 10/11/2018 69  % Final   Neutro Abs 10/11/2018 5.3  1.7 - 7.7 K/uL Final   Lymphocytes Relative 10/11/2018 12  % Final   Lymphs Abs 10/11/2018 0.9  0.7 - 4.0 K/uL Final   Monocytes Relative 10/11/2018 11  % Final   Monocytes Absolute 10/11/2018 0.8  0.1 - 1.0 K/uL Final   Eosinophils Relative 10/11/2018 7  % Final   Eosinophils Absolute 10/11/2018 0.6* 0.0 - 0.5 K/uL Final   Basophils Relative 10/11/2018 1  % Final   Basophils Absolute 10/11/2018 0.1  0.0 - 0.1 K/uL Final   Immature Granulocytes 10/11/2018 0  % Final   Abs Immature Granulocytes 10/11/2018 0.02  0.00 - 0.07 K/uL Final   Performed at [facility], [address], [place], [place] [address]    Assessment:  Jimmy West is a 68 y.o. 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  Pathology revealed an invasive high-grade adenocarcinoma, with predominantly solid growth pattern.  The neoplastic cells were TTF-1 (+), Napsin A (+), and P40 (+).  He has a T4 vertebral metastasis.  Clinical stage is T4N1M1.  There was not enough material for Foundation One testing.  PDL-1 revealed TPS 90%.  Chest CT angiogram on 04/27/2018 revealed no evidence of significant pulmonary embolus.  There was a 4.1 cm mass in the superior segment right lower lobe posteriorly causing obliteration of the in coming bronchus and mild postobstructive change. There were multiple additional bilateral pulmonary nodules (< 1 cm - 1.4 cm).   There were enlarged  right hilar lymph nodes.  There was metastasis to the T4 vertebra with mild associated compression.  There was emphysematous changes in the lungs.   PET scan on 04/30/2018 revealed a 3.4 cm hypermetabolic RLL pulmonary mass (SUV 13.2), 11 mm pulmonary nodule in the LEFT lung (SUV 4.5), RIGHT paratracheal lymph node (SUV 5.1), and hypermetabolic activity within the T4 vertebral body (SUV 10.2).   Thoracic spine MRI on 05/03/2018 revealed T4 metastasis with a 40% pathologic compression deformity and 5 mm of retropulsion of the vertebral body.  Retropulsion results in mild spinal canal stenosis and mild bilateral C4-5 foraminal stenosis.  There was paravertebral soft tissue thickening from mid T3 to mid T5, likely representing edema related to the pathologic compression deformity vs. possible extraosseous extension of the neoplasm.  There were no additional thoracic spinal metastases noted.   Head MRI on 05/03/2018 revealed no intracranial metastatic disease.  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  Incidental mention made of mild paranasal sinus disease.  He is s/p cycle #6 pembrolizumab (05/24/2018 - 09/20/2018).  He is tolerating treatment well.  He completed T4 radiation on 07/07/2018.  He receives Xgeva monthly (06/03/2018 - 09/20/2018).  [facility] spine CT at [facility] on 08/17/2018 revealed pathologic compression fracture of T4 resulting in vertebra plana. Fracture fragments protrude into the spinal canal resulting in  moderate to severe canal narrowing at T4.  There was severe right and moderate left foraminal narrowing at T4-T5 secondary to vertebra plana.  There were multiple spiculated pulmonary masses consistent with metastatic adenocarcinoma, similar in appearance to April 30, 2018 PET/CT.  Chest CT on 08/26/2018 revealed a  3.1 cm right lower lobe mass, corresponding to known primary bronchogenic neoplasm, mildly decreased.  Bilateral pulmonary nodules/metastases, most of which are improved.  There was mildly progressive pleural-based nodularity in the right upper lobe along the minor fissure, including a dominant 17 x 11 mm nodule.  There was a small right pleural effusion, decreased.  There was a 15 mm left adrenal metastasis, decreased.    Symptomatically, he is doing well.  He notes that his pain is under better control with Duragesic 50 mcg/hr patch.  He continues to use Roxicodone for breakthrough pain.  Pain in his ribs has resolved.  No B symptoms or interval infections.  Appetite has improved since; weight up 7 pounds.  Exam remains at baseline.  WBC 7600 ([place] 5300).  Plan: 1. Labs today:  CBC with diff, CMP, TSH, free T4 2. Metastatic high-grade adenocarcinoma the RIGHT lung  Doing well overall.  Tolerating treatments well with minimal side effects.  Currently s/p 6 cycles of palliative immunotherapy (pembrolizumab).  Labs reviewed. Blood counts stable and adequate enough for treatment. Will proceed with cycle #7 pembrolizumab. 3. T4 osseous metastasis  Has been seen in consult at [facility] to discuss surgical intervention.  Surgery on hold at this point due to fear related to the procedure as well as the amount of associated recovery time.  Scheduled for repeat CT imaging in 10/2018, at which time definitive plans will be made based on whether or not progression is noted.  Continues on monthly denosumab (due 10/18/2018).  Encouraged to continue calcium 1200 mg vitamin D 800 IU daily. 4. Pain and symptom management  Patient has antiemetics at home to use on a PRN basis, and advises that the prescribed interventions are adequately managing her symptoms at this point.   Patient jointly managed by palliative care ([place], NP).  Next scheduled appointment is on 10/13/2018.  Pain well managed on current  regimen:  Duragesic 50 mcg/hr patch  Roxicodone 10 to 20 mg every 4-6 hours PRN for BTP.  5. Protein calorie malnutrition  Appetite has improved and patient is gaining weight.  His weight today is 159 lb 9.8 oz (72.4 kg). His BMI of 25.00 kg/m places him in the normal weight category.   Encouraged him to increase his intake of calorie and protein dense food choices.   Continue to utilize nutritional supplement shakes at least 2-3 times a day   Using Ensure Enlive which provides 350 calories, 11 g fat, 44 g of carbohydrates, and 20 g protein.  Patient followed by the cancer center RD - continue as already scheduled.  6. RTC on 10/18/2018 for labs (BMP) and Xgeva. 7. RTC on 11/01/2018 for MD assessment, labs (CBC with diff, CMP, TSH), and cycle #8 pembrolizumab.   Honor Loh, NP  10/11/2018, 10:15 AM   I saw and evaluated the patient, participating in the key portions of the service and reviewing pertinent diagnostic studies and records.  I reviewed the nurse practitioner's note and agree with the findings and the plan.  The assessment and plan were discussed with the patient.  Multiple questions were asked by the patient and answered.   Nolon Stalls, MD 10/11/2018, 10:15 AM

## 2018-10-11 ENCOUNTER — Inpatient Hospital Stay: Payer: Medicare Other

## 2018-10-11 ENCOUNTER — Encounter: Payer: Self-pay | Admitting: Hematology and Oncology

## 2018-10-11 ENCOUNTER — Other Ambulatory Visit: Payer: Self-pay

## 2018-10-11 ENCOUNTER — Inpatient Hospital Stay (HOSPITAL_BASED_OUTPATIENT_CLINIC_OR_DEPARTMENT_OTHER): Payer: Medicare Other | Admitting: Hematology and Oncology

## 2018-10-11 VITALS — BP 108/70 | HR 64 | Temp 97.5°F | Resp 18

## 2018-10-11 VITALS — BP 111/80 | HR 76 | Temp 98.6°F | Resp 16 | Wt 159.6 lb

## 2018-10-11 DIAGNOSIS — C3411 Malignant neoplasm of upper lobe, right bronchus or lung: Secondary | ICD-10-CM

## 2018-10-11 DIAGNOSIS — C7951 Secondary malignant neoplasm of bone: Secondary | ICD-10-CM | POA: Diagnosis not present

## 2018-10-11 DIAGNOSIS — Z87891 Personal history of nicotine dependence: Secondary | ICD-10-CM

## 2018-10-11 DIAGNOSIS — C3431 Malignant neoplasm of lower lobe, right bronchus or lung: Secondary | ICD-10-CM

## 2018-10-11 DIAGNOSIS — I252 Old myocardial infarction: Secondary | ICD-10-CM | POA: Diagnosis not present

## 2018-10-11 DIAGNOSIS — Z5112 Encounter for antineoplastic immunotherapy: Secondary | ICD-10-CM

## 2018-10-11 DIAGNOSIS — E441 Mild protein-calorie malnutrition: Secondary | ICD-10-CM

## 2018-10-11 DIAGNOSIS — Z79899 Other long term (current) drug therapy: Secondary | ICD-10-CM | POA: Diagnosis not present

## 2018-10-11 DIAGNOSIS — C7972 Secondary malignant neoplasm of left adrenal gland: Secondary | ICD-10-CM | POA: Diagnosis not present

## 2018-10-11 DIAGNOSIS — G893 Neoplasm related pain (acute) (chronic): Secondary | ICD-10-CM

## 2018-10-11 DIAGNOSIS — Z7982 Long term (current) use of aspirin: Secondary | ICD-10-CM

## 2018-10-11 DIAGNOSIS — Z791 Long term (current) use of non-steroidal anti-inflammatories (NSAID): Secondary | ICD-10-CM | POA: Diagnosis not present

## 2018-10-11 DIAGNOSIS — E46 Unspecified protein-calorie malnutrition: Secondary | ICD-10-CM

## 2018-10-11 DIAGNOSIS — Z7189 Other specified counseling: Secondary | ICD-10-CM

## 2018-10-11 LAB — CBC WITH DIFFERENTIAL/PLATELET
Abs Immature Granulocytes: 0.02 10*3/uL (ref 0.00–0.07)
Basophils Absolute: 0.1 10*3/uL (ref 0.0–0.1)
Basophils Relative: 1 %
Eosinophils Absolute: 0.6 10*3/uL — ABNORMAL HIGH (ref 0.0–0.5)
Eosinophils Relative: 7 %
HCT: 39.8 % (ref 39.0–52.0)
Hemoglobin: 13 g/dL (ref 13.0–17.0)
Immature Granulocytes: 0 %
Lymphocytes Relative: 12 %
Lymphs Abs: 0.9 10*3/uL (ref 0.7–4.0)
MCH: 28.1 pg (ref 26.0–34.0)
MCHC: 32.7 g/dL (ref 30.0–36.0)
MCV: 86 fL (ref 80.0–100.0)
Monocytes Absolute: 0.8 10*3/uL (ref 0.1–1.0)
Monocytes Relative: 11 %
Neutro Abs: 5.3 10*3/uL (ref 1.7–7.7)
Neutrophils Relative %: 69 %
Platelets: 229 10*3/uL (ref 150–400)
RBC: 4.63 MIL/uL (ref 4.22–5.81)
RDW: 14.9 % (ref 11.5–15.5)
WBC: 7.6 10*3/uL (ref 4.0–10.5)
nRBC: 0 % (ref 0.0–0.2)

## 2018-10-11 LAB — COMPREHENSIVE METABOLIC PANEL
ALT: 19 U/L (ref 0–44)
AST: 26 U/L (ref 15–41)
Albumin: 3.7 g/dL (ref 3.5–5.0)
Alkaline Phosphatase: 91 U/L (ref 38–126)
Anion gap: 7 (ref 5–15)
BUN: 16 mg/dL (ref 8–23)
CO2: 27 mmol/L (ref 22–32)
Calcium: 9 mg/dL (ref 8.9–10.3)
Chloride: 102 mmol/L (ref 98–111)
Creatinine, Ser: 0.81 mg/dL (ref 0.61–1.24)
GFR calc Af Amer: >60 mL/min (ref >60)
GFR calc non Af Amer: >60 mL/min (ref >60)
Glucose, Bld: 135 mg/dL — ABNORMAL HIGH (ref 70–99)
Potassium: 4.1 mmol/L (ref 3.5–5.1)
Sodium: 136 mmol/L (ref 135–145)
Total Bilirubin: 0.5 mg/dL (ref 0.3–1.2)
Total Protein: 7.1 g/dL (ref 6.5–8.1)

## 2018-10-11 LAB — TSH: TSH: 2.107 u[IU]/mL (ref 0.350–4.500)

## 2018-10-11 MED ORDER — SODIUM CHLORIDE 0.9 % IV SOLN
200.0000 mg | Freq: Once | INTRAVENOUS | Status: AC
  Administered 2018-10-11: 200 mg via INTRAVENOUS
  Filled 2018-10-11: qty 8

## 2018-10-11 MED ORDER — SODIUM CHLORIDE 0.9% FLUSH
10.0000 mL | INTRAVENOUS | Status: DC | PRN
  Administered 2018-10-11: 10 mL via INTRAVENOUS
  Filled 2018-10-11: qty 10

## 2018-10-11 MED ORDER — SODIUM CHLORIDE 0.9 % IV SOLN
Freq: Once | INTRAVENOUS | Status: AC
  Administered 2018-10-11: 11:00:00 via INTRAVENOUS
  Filled 2018-10-11: qty 250

## 2018-10-11 MED ORDER — HEPARIN SOD (PORK) LOCK FLUSH 100 UNIT/ML IV SOLN
500.0000 [IU] | Freq: Once | INTRAVENOUS | Status: AC
  Administered 2018-10-11: 500 [IU] via INTRAVENOUS

## 2018-10-11 NOTE — Progress Notes (Signed)
Pt. Here for follow up. Denies any concerns at this time.

## 2018-10-11 NOTE — Progress Notes (Signed)
Patient tolerated his immunotherapy well today. Ambulatory at discharge. VSS.

## 2018-10-12 ENCOUNTER — Other Ambulatory Visit: Payer: Self-pay

## 2018-10-12 LAB — T4: T4, Total: 7.8 ug/dL (ref 4.5–12.0)

## 2018-10-13 ENCOUNTER — Inpatient Hospital Stay (HOSPITAL_BASED_OUTPATIENT_CLINIC_OR_DEPARTMENT_OTHER): Payer: Medicare Other | Admitting: Hospice and Palliative Medicine

## 2018-10-13 ENCOUNTER — Other Ambulatory Visit: Payer: Self-pay

## 2018-10-13 VITALS — BP 133/80 | HR 91 | Temp 97.9°F | Resp 18 | Wt 156.6 lb

## 2018-10-13 DIAGNOSIS — G893 Neoplasm related pain (acute) (chronic): Secondary | ICD-10-CM | POA: Diagnosis not present

## 2018-10-13 DIAGNOSIS — C3431 Malignant neoplasm of lower lobe, right bronchus or lung: Secondary | ICD-10-CM

## 2018-10-13 DIAGNOSIS — Z515 Encounter for palliative care: Secondary | ICD-10-CM

## 2018-10-13 NOTE — Progress Notes (Signed)
[place]  [phone] [phone]   Name: Jimmy West Date: 10/13/2018 MRN: [id number]  DOB: 1951/07/02  Patient Care Team: [name], NP as PCP - General (Nurse Practitioner) Telford Nab, RN as Registered Nurse [place], Gerda Diss, MD as Attending Physician (Emergency Medicine)    REASON FOR CONSULTATION: Palliative Care consult requested for this 69 y.o. male with multiple medical problems including presumed metastatic lung cancer.  Patient was noted to have pulmonary nodules on recent chest x-ray from PCP.  He was subsequently referred to oncology for work-up and saw Dr. Mike Gip on 04/30/2018.  PET scan revealed several hypermetabolic pulmonary nodules and activity within the T4 vertebral body.  Follow-up MRI of the spine revealed T4 metastasis with pathologic compression deformity and mild spinal canal stenosis with probable edema and extraosseous extension of neoplasm from mid T3 to mid T5. Patient was referred to the palliative care clinic to assist with symptom management and establishing treatment goals.   SOCIAL HISTORY:    Patient has not married and has no children.  He has a brother who lives out of state.  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  ADVANCE DIRECTIVES:  Does not have.  CODE STATUS: Full code  PAST MEDICAL HISTORY: Past Medical History:  Diagnosis Date  . Bulging lumbar disc   . Cancer ([place])   . Heart attack ([place])     PAST SURGICAL HISTORY:  Past Surgical History:  Procedure Laterality Date  . CARDIAC CATHETERIZATION     two stents  . KNEE SURGERY Left   . PORTA CATH INSERTION N/A 05/21/2018   Procedure: PORTA CATH INSERTION;  Surgeon: Algernon Huxley, MD;  Location: [facility];  Service: Cardiovascular;  Laterality: N/A;    HEMATOLOGY/ONCOLOGY HISTORY:    Primary cancer of right lower lobe of lung ([place])    04/26/2018 Initial Diagnosis    Primary cancer of right lower lobe of lung ([place])    05/24/2018 -  Chemotherapy    The patient had pembrolizumab (KEYTRUDA) 200 mg in sodium chloride 0.9 % 50 mL chemo infusion, 200 mg, Intravenous, Once, 7 of 10 cycles Administration: 200 mg (05/24/2018), 200 mg (07/16/2018), 200 mg (08/09/2018), 200 mg (08/30/2018), 200 mg (09/20/2018), 200 mg (06/14/2018), 200 mg (10/11/2018)  for chemotherapy treatment.      ALLERGIES:  has No Known Allergies.  MEDICATIONS:  Current Outpatient Medications  Medication Sig Dispense Refill  . acetaminophen (TYLENOL) 500 MG tablet Take 500 mg by mouth 3 (three) times daily as needed.    Marland Kitchen aspirin EC 81 MG tablet Take 81 mg by mouth daily.    . Calcium 600-400 MG-UNIT CHEW Chew 2 tablets by mouth daily.    . fentaNYL (DURAGESIC) 50 MCG/HR Place 1 patch onto the skin every 3 (three) days. 5 patch 0  . ibuprofen (ADVIL,MOTRIN) 200 MG tablet Take 200 mg by mouth every 6 (six) hours as needed.    . lidocaine-prilocaine (EMLA) cream Apply to affected area once 30 g 3  . naloxone (NARCAN) nasal spray 4 mg/0.1 mL 1 spray by nose in the event of opioid overdose. Call EMS (911) immediately if medication is used. (Patient not taking: Reported on 09/20/2018) 1 kit 0  . ondansetron (ZOFRAN) 8 MG tablet Take 1 tablet (8 mg total) by mouth 2 (two) times daily as needed (Nausea or vomiting). Start if needed on the third day after chemotherapy. (  Patient not taking: Reported on 09/20/2018) 30 tablet 1  . Oxycodone HCl 10 MG TABS Take 1-2 tablets (10-20 mg total) by mouth every 4 (four) hours as needed. 90 tablet 0  . pembrolizumab (KEYTRUDA) 100 MG/4ML SOLN Inject 2 mg/kg into the vein every 30 (thirty) days.     . polyethylene glycol (MIRALAX / GLYCOLAX) packet Take 17 g by mouth daily.    Marland Kitchen senna (SENOKOT) 8.6 MG tablet Take 1 tablet by mouth as needed.      No current facility-administered medications for this visit.     VITAL SIGNS: There were no  vitals taken for this visit. There were no vitals filed for this visit.  Estimated body mass index is 25 kg/m as calculated from the following:   Height as of 08/30/18: 5' 7"  (1.702 m).   Weight as of 10/11/18: 159 lb 9.8 oz (72.4 kg).  LABS: CBC:    Component Value Date/Time   WBC 7.6 10/11/2018 0923   HGB 13.0 10/11/2018 0923   HGB 14.9 04/06/2014 0826   HCT 39.8 10/11/2018 0923   HCT 44.8 04/06/2014 0826   PLT 229 10/11/2018 0923   PLT 238 04/06/2014 0826   MCV 86.0 10/11/2018 0923   MCV 95 04/06/2014 0826   NEUTROABS 5.3 10/11/2018 0923   NEUTROABS 3.9 04/06/2014 0826   LYMPHSABS 0.9 10/11/2018 0923   LYMPHSABS 1.1 04/06/2014 0826   MONOABS 0.8 10/11/2018 0923   MONOABS 0.7 04/06/2014 0826   EOSABS 0.6 (H) 10/11/2018 0923   EOSABS 0.1 04/06/2014 0826   BASOSABS 0.1 10/11/2018 0923   BASOSABS 0.1 04/06/2014 0826   Comprehensive Metabolic Panel:    Component Value Date/Time   NA 136 10/11/2018 0923   NA 140 04/06/2014 0826   K 4.1 10/11/2018 0923   K 4.0 04/06/2014 0826   CL 102 10/11/2018 0923   CL 108 (H) 04/06/2014 0826   CO2 27 10/11/2018 0923   CO2 25 04/06/2014 0826   BUN 16 10/11/2018 0923   BUN 12 04/06/2014 0826   CREATININE 0.81 10/11/2018 0923   CREATININE 1.17 04/06/2014 0826   GLUCOSE 135 (H) 10/11/2018 0923   GLUCOSE 106 (H) 04/06/2014 0826   CALCIUM 9.0 10/11/2018 0923   CALCIUM 8.9 04/06/2014 0826   AST 26 10/11/2018 0923   AST 24 04/06/2014 0826   ALT 19 10/11/2018 0923   ALT 33 04/06/2014 0826   ALKPHOS 91 10/11/2018 0923   ALKPHOS 85 04/06/2014 0826   BILITOT 0.5 10/11/2018 0923   BILITOT 0.8 04/06/2014 0826   PROT 7.1 10/11/2018 0923   PROT 7.3 04/06/2014 0826   ALBUMIN 3.7 10/11/2018 0923   ALBUMIN 3.8 04/06/2014 0826    RADIOGRAPHIC STUDIES: No results found.  PERFORMANCE STATUS (ECOG) : 1 - Symptomatic but completely ambulatory  Review of Systems As noted above. Otherwise, a complete review of systems is negative.   Physical Exam General: NAD, frail appearing, thin Cardiovascular: regular rate and rhythm Pulmonary: clear ant fields Abdomen: soft, nontender, + bowel sounds GU: no suprapubic tenderness Extremities: no edema Skin: no rashes Neurological: Weakness but otherwise nonfocal  IMPRESSION: Follow-up visit today in the clinic for pain.  Patient is unaccompanied.  Patient reports doing reasonably well over the past few weeks.  He says his been eating more and weight has been slowly uptrending following his consultation with the dietitian.  Pain has reportedly been improved as patient is no longer able to work due to COVID-19.  He continues on fentanyl 50 mcg  every 72 hours and has been taking oxycodone 20 mg 2-3 times a day.  Pain is reportedly averaging 3 out of 10.  Patient says he has follow-up next month with the spine surgeon at [facility] but is unsure if he will be a candidate for surgery.  Patient denies any other changes or concerns.  Discussed constipation management.   ACP documents previously reviewed and patient completing at his convenience.  PLAN: 1. Continue oxycodone 10-68m Q4-6H PRN 2. Continue transdermal fentanyl 564m Q72H 3.  Prophylactic bowel regimen 4.  Patient reviewing ACP documents at home 7.  RTC in 1-2 months  Patient expressed understanding and was in agreement with this plan. He also understands that He can call clinic at any time with any questions, concerns, or complaints.   Time Total: 20 minutes  Visit consisted of counseling and education dealing with the complex and emotionally intense issues of symptom management and palliative care in the setting of serious and potentially life-threatening illness.Greater than 50%  of this time was spent counseling and coordinating care related to the above assessment and plan.  Signed by: JoAltha HarmPhD, DNP, NP-C, [phone] Cell)

## 2018-10-15 ENCOUNTER — Other Ambulatory Visit: Payer: Self-pay

## 2018-10-18 ENCOUNTER — Other Ambulatory Visit: Payer: Self-pay | Admitting: *Deleted

## 2018-10-18 ENCOUNTER — Inpatient Hospital Stay: Payer: Medicare Other

## 2018-10-18 ENCOUNTER — Other Ambulatory Visit: Payer: Self-pay

## 2018-10-18 VITALS — BP 112/68 | HR 75 | Temp 97.3°F | Resp 18

## 2018-10-18 DIAGNOSIS — C7972 Secondary malignant neoplasm of left adrenal gland: Secondary | ICD-10-CM | POA: Diagnosis not present

## 2018-10-18 DIAGNOSIS — Z87891 Personal history of nicotine dependence: Secondary | ICD-10-CM | POA: Diagnosis not present

## 2018-10-18 DIAGNOSIS — E46 Unspecified protein-calorie malnutrition: Secondary | ICD-10-CM | POA: Diagnosis not present

## 2018-10-18 DIAGNOSIS — C3431 Malignant neoplasm of lower lobe, right bronchus or lung: Secondary | ICD-10-CM

## 2018-10-18 DIAGNOSIS — C3411 Malignant neoplasm of upper lobe, right bronchus or lung: Secondary | ICD-10-CM | POA: Diagnosis not present

## 2018-10-18 DIAGNOSIS — Z5112 Encounter for antineoplastic immunotherapy: Secondary | ICD-10-CM | POA: Diagnosis not present

## 2018-10-18 DIAGNOSIS — C7951 Secondary malignant neoplasm of bone: Secondary | ICD-10-CM | POA: Diagnosis not present

## 2018-10-18 LAB — BASIC METABOLIC PANEL
Anion gap: 8 (ref 5–15)
BUN: 16 mg/dL (ref 8–23)
CO2: 23 mmol/L (ref 22–32)
Calcium: 9 mg/dL (ref 8.9–10.3)
Chloride: 107 mmol/L (ref 98–111)
Creatinine, Ser: 0.88 mg/dL (ref 0.61–1.24)
GFR calc Af Amer: >60 mL/min (ref >60)
GFR calc non Af Amer: >60 mL/min (ref >60)
Glucose, Bld: 91 mg/dL (ref 70–99)
Potassium: 4.1 mmol/L (ref 3.5–5.1)
Sodium: 138 mmol/L (ref 135–145)

## 2018-10-18 MED ORDER — OXYCODONE HCL 10 MG PO TABS: 10.0000 mg | ORAL_TABLET | ORAL | 0 refills | Status: DC | PRN

## 2018-10-18 MED ORDER — FENTANYL 50 MCG/HR TD PT72: 1.0000 | MEDICATED_PATCH | TRANSDERMAL | 0 refills | Status: DC

## 2018-10-18 MED ORDER — DENOSUMAB 120 MG/1.7ML ~~LOC~~ SOLN
120.0000 mg | Freq: Once | SUBCUTANEOUS | Status: AC
  Administered 2018-10-18: 120 mg via SUBCUTANEOUS

## 2018-10-18 NOTE — Patient Instructions (Signed)

## 2018-10-22 ENCOUNTER — Telehealth: Payer: Self-pay

## 2018-10-22 NOTE — Telephone Encounter (Signed)
Nutrition  RD working remotely.  Called patient to let him know that RD is conducting all nutrition visits over the phone due to COVID-19.  Left message asking patient NOT to come to clinic on Monday, April 6th for nutrition follow-up but that RD would call him instead.  Contact number left.  Jimmy West B. Zenia Resides, [place], [place] Registered Dietitian [phone] (pager)

## 2018-10-25 ENCOUNTER — Other Ambulatory Visit: Payer: Self-pay

## 2018-10-25 ENCOUNTER — Inpatient Hospital Stay: Payer: Medicare Other | Attending: Hematology and Oncology

## 2018-10-25 DIAGNOSIS — C3431 Malignant neoplasm of lower lobe, right bronchus or lung: Secondary | ICD-10-CM | POA: Insufficient documentation

## 2018-10-25 DIAGNOSIS — C7951 Secondary malignant neoplasm of bone: Secondary | ICD-10-CM | POA: Insufficient documentation

## 2018-10-25 DIAGNOSIS — C7801 Secondary malignant neoplasm of right lung: Secondary | ICD-10-CM | POA: Insufficient documentation

## 2018-10-25 DIAGNOSIS — C7972 Secondary malignant neoplasm of left adrenal gland: Secondary | ICD-10-CM | POA: Insufficient documentation

## 2018-10-25 DIAGNOSIS — Z923 Personal history of irradiation: Secondary | ICD-10-CM | POA: Insufficient documentation

## 2018-10-25 DIAGNOSIS — Z5112 Encounter for antineoplastic immunotherapy: Secondary | ICD-10-CM | POA: Insufficient documentation

## 2018-10-25 DIAGNOSIS — Z79899 Other long term (current) drug therapy: Secondary | ICD-10-CM | POA: Insufficient documentation

## 2018-10-25 NOTE — Progress Notes (Signed)
Nutrition Follow-up:  RD working remotely.   Patient with adenocarcinoma of the lung.  Patient receiving pembrolizmab.    Called patient this am for nutrition follow-up over the phone due to COVID-19 virus.  Patient reports that appetite maybe a little bit better.  Reports that his friend has been using the ensure enlive and adding fruit or peanut butter and ice to them and blending and making a smoothie that he has been drinking during the day (typically only 1 daily).  Patient reports that he has been eating a biscuit (egg or sausage/bacon) most mornings and may snack during the day.  Some days will have sandwich for lunch with cheese, mayo, lettuce tomato and chips.  Supper maybe a sandwich or frozen meal or pizza.  Reports that pain is some better.    Reports that he is currently not working due to COVID-19 virus.      Medications: reviewed  Labs: reviewed  Anthropometrics:   Weight noted on 3/23 with Dr. Mike Gip 159 and weight on 3/25 with Josh 156 lb 9.6 oz  Weight  152 lb 12.5 oz on 3/2   NUTRITION DIAGNOSIS: Unintentional weight loss improving   INTERVENTION:  Encouraged patient to continue with efforts to increase calories and protein.   Congratulated patient on weight gain.   Encouraged ensure enlive shake at least daily.  Patient will contact RD if more ensure needed.   Contact information provided    MONITORING, EVALUATION, GOAL: weight trends, intake   NEXT VISIT: phone follow-up on May 5  Damichael Hofman B. Zenia Resides, [place], [place] Registered Dietitian [phone] (pager)

## 2018-10-29 DIAGNOSIS — C801 Malignant (primary) neoplasm, unspecified: Secondary | ICD-10-CM | POA: Diagnosis not present

## 2018-10-29 DIAGNOSIS — C7951 Secondary malignant neoplasm of bone: Secondary | ICD-10-CM | POA: Diagnosis not present

## 2018-10-29 DIAGNOSIS — Z01818 Encounter for other preprocedural examination: Secondary | ICD-10-CM | POA: Diagnosis not present

## 2018-10-29 DIAGNOSIS — R918 Other nonspecific abnormal finding of lung field: Secondary | ICD-10-CM | POA: Diagnosis not present

## 2018-10-31 ENCOUNTER — Other Ambulatory Visit: Payer: Self-pay

## 2018-10-31 NOTE — Progress Notes (Signed)
[facility] [address], [place], [place] [address] Phone: [phone]  Fax: [phone]    Clinic day:  11/01/2018   Chief Complaint: Jimmy West is a 68 y.o. male with metastatic adenocarcinoma of the lung who is seen for 3 week assessment prior to cycle #8 pembrolizumab.  HPI:  The patient was last seen in the medical oncology clinic on 10/11/2018.  At that time, he was doing well.  His pain was under better control with Fentanyl 50 mcg/hr patch.  He continued to use Roxicodone for breakthrough pain.  Pain in his ribs had resolved.  No B symptoms or interval infections.  Appetite had improved.  Weight was up 7 pounds.  Exam remained at baseline.  WBC was 7600 ([facility] 5300).  He received cycle #7 pembrolizumab on 10/11/2018.  He received Xgeva on 10/18/2018.  He saw Billey Chang, NP on 10/13/2018.  Notes reviewed.  No changes were made in his medications.  He spoke with Jennet Maduro, RD on 10/25/2018.  He was doing well with increased caloric intake.  He had gained weight.  During the interim, he denies any complaint.  The pain in his back is not bad because he is not working.  He is able to play golf.  He denies very little shortness of breath.  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  He went to [facility] on Friday, 10/29/2018, and had scans.  Thoracic spine CT on 10/29/2018 at [facility] revealed unchanged severe compression deformity/vertebral vertebra plana of T4.  There was increased sclerotic changes within the collapsed vertebral body possibly due to interval radiation.  There was a stable degree of retropulsion and focal mild spinal canal stenosis at T4.  There was interval enlargement of the left and right upper lobe spiculated pulmonary nodules and right lower lobe mass.  Thoracic spine imaging at [facility] on 08/17/2018 had revealed a 3.8 x 2.3 cm right lower lobe mass and a 1.7 x 1.0 cm right upper lobe mass.   Past Medical History:  Diagnosis  Date   Bulging lumbar disc    Cancer ([place])    Heart attack ([place])     Past Surgical History:  Procedure Laterality Date   CARDIAC CATHETERIZATION     two stents   KNEE SURGERY Left    PORTA CATH INSERTION N/A 05/21/2018   Procedure: PORTA CATH INSERTION;  Surgeon: Algernon Huxley, MD;  Location: [place] [facility];  Service: Cardiovascular;  Laterality: N/A;    Family History  Problem Relation Age of Onset   Heart failure Father    Cancer Maternal Aunt    Heart failure Maternal Uncle    Dementia Paternal Grandmother    Cancer Maternal Aunt     Social History:  reports that he quit smoking about 15 years ago. His smoking use included cigarettes. He has a 22.50 pack-year smoking history. He has never used smokeless tobacco. He reports current alcohol use of about 15.0 standard drinks of alcohol per week. He reports that he does not use drugs. He started smoking at age 68.   He is smoking 1 1/2 packs/day.  Patient is employed as as full time Probation officer. Patient denies known exposures to radiation on toxins. The patient is alone today.  Allergies: No Known Allergies  Current Medications: Current Outpatient Medications  Medication Sig Dispense Refill   acetaminophen (TYLENOL) 500 MG tablet Take 500 mg by mouth 3 (three) times daily as needed.  aspirin EC 81 MG tablet Take 81 mg by mouth daily.     Calcium 600-400 MG-UNIT CHEW Chew 2 tablets by mouth daily.     fentaNYL (DURAGESIC) 50 MCG/HR Place 1 patch onto the skin every 3 (three) days. 10 patch 0   Oxycodone HCl 10 MG TABS Take 1-2 tablets (10-20 mg total) by mouth every 4 (four) hours as needed. 90 tablet 0   pembrolizumab (KEYTRUDA) 100 MG/4ML SOLN Inject 2 mg/kg into the vein every 30 (thirty) days.      ibuprofen (ADVIL,MOTRIN) 200 MG tablet Take 200 mg by mouth every 6 (six) hours as needed.     lidocaine-prilocaine (EMLA) cream Apply to affected area once (Patient not taking: Reported on 11/01/2018) 30 g  3   naloxone (NARCAN) nasal spray 4 mg/0.1 mL 1 spray by nose in the event of opioid overdose. Call EMS (911) immediately if medication is used. (Patient not taking: Reported on 10/13/2018) 1 kit 0   ondansetron (ZOFRAN) 8 MG tablet Take 1 tablet (8 mg total) by mouth 2 (two) times daily as needed (Nausea or vomiting). Start if needed on the third day after chemotherapy. (Patient not taking: Reported on 10/13/2018) 30 tablet 1   polyethylene glycol (MIRALAX / GLYCOLAX) packet Take 17 g by mouth daily.     senna (SENOKOT) 8.6 MG tablet Take 1 tablet by mouth as needed.      No current facility-administered medications for this visit.    Facility-Administered Medications Ordered in Other Visits  Medication Dose Route Frequency Provider Last Rate Last Dose   heparin lock flush 100 unit/mL  500 Units Intravenous Once Jacaria Colburn C, MD       sodium chloride flush (NS) 0.9 % injection 10 mL  10 mL Intravenous PRN Lequita Asal, MD   10 mL at 11/01/18 0932    Review of Systems  Constitutional: Negative.  Negative for chills, diaphoresis, fever, malaise/fatigue and weight loss (up 2 pounds).       Feels "good".  HENT: Negative.  Negative for congestion, ear discharge, ear pain, nosebleeds, sinus pain and sore throat.   Eyes: Negative.  Negative for blurred vision, double vision, photophobia and pain.  Respiratory: Negative.  Negative for cough, hemoptysis, sputum production, shortness of breath and wheezing.   Cardiovascular: Negative.  Negative for chest pain, palpitations, orthopnea, leg swelling and PND.  Gastrointestinal: Negative.  Negative for abdominal pain, blood in stool, constipation, diarrhea, heartburn, melena, nausea and vomiting.  Genitourinary: Negative.  Negative for frequency, hematuria and urgency.  Musculoskeletal: Positive for back pain (well managed). Negative for falls, joint pain, myalgias and neck pain.  Skin: Negative.  Negative for itching and rash.    Neurological: Positive for sensory change (chronic numbness to LEFT heel 2/2 spinal disc issues). Negative for dizziness, tremors, focal weakness, weakness and headaches.  Endo/Heme/Allergies: Negative.  Does not bruise/bleed easily.  Psychiatric/Behavioral: Negative for depression and memory loss. The patient is not nervous/anxious and does not have insomnia.   All other systems reviewed and are negative.  Performance status (ECOG): 1  Vital Signs BP 98/66 (BP Location: Right Arm, Patient Position: Sitting)    Pulse 76    Temp 98.7 F (37.1 C) (Tympanic)    Resp 18    Ht 5' 7"  (1.702 m)    Wt 158 lb 9.9 oz (72 kg)    SpO2 97%    BMI 24.84 kg/m   Physical Exam  Constitutional: He is oriented to person, place, and  time and well-developed, well-nourished, and in no distress. No distress.  HENT:  Head: Normocephalic and atraumatic.  Mouth/Throat: Oropharynx is clear and moist and mucous membranes are normal. He has dentures. No oropharyngeal exudate.  Brown hair with slight graying.  Mustache.  Eyes: Pupils are equal, round, and reactive to light. Conjunctivae and EOM are normal. No scleral icterus.  Neck: Normal range of motion. Neck supple. No JVD present.  Cardiovascular: Normal rate, regular rhythm, normal heart sounds and intact distal pulses. Exam reveals no gallop and no friction rub.  No murmur heard. Pulmonary/Chest: Effort normal and breath sounds normal. No respiratory distress. He has no wheezes. He has no rales.  Abdominal: Soft. Bowel sounds are normal. He exhibits no distension and no mass. There is no abdominal tenderness. There is no rebound and no guarding.  Musculoskeletal: Normal range of motion.        General: No edema.     Thoracic back: He exhibits tenderness (known T4 vertebral body metastasis).  Lymphadenopathy:    He has no cervical adenopathy.    He has no axillary adenopathy.       Right: No supraclavicular adenopathy present.       Left: No supraclavicular  adenopathy present.  Neurological: He is alert and oriented to person, place, and time. Gait normal.  Skin: Skin is warm and dry. No rash noted. He is not diaphoretic. No erythema. No pallor.  Psychiatric: Mood, affect and judgment normal.  Nursing note and vitals reviewed.   Infusion on 11/01/2018  Component Date Value Ref Range Status   Sodium 11/01/2018 135  135 - 145 mmol/L Final   Potassium 11/01/2018 3.5  3.5 - 5.1 mmol/L Final   Chloride 11/01/2018 103  98 - 111 mmol/L Final   CO2 11/01/2018 24  22 - 32 mmol/L Final   Glucose, Bld 11/01/2018 187* 70 - 99 mg/dL Final   BUN 11/01/2018 15  8 - 23 mg/dL Final   Creatinine, Ser 11/01/2018 0.90  0.61 - 1.24 mg/dL Final   Calcium 11/01/2018 9.2  8.9 - 10.3 mg/dL Final   Total Protein 11/01/2018 7.3  6.5 - 8.1 g/dL Final   Albumin 11/01/2018 4.0  3.5 - 5.0 g/dL Final   AST 11/01/2018 24  15 - 41 U/L Final   ALT 11/01/2018 15  0 - 44 U/L Final   Alkaline Phosphatase 11/01/2018 67  38 - 126 U/L Final   Total Bilirubin 11/01/2018 1.2  0.3 - 1.2 mg/dL Final   GFR calc non Af Amer 11/01/2018 >60  >60 mL/min Final   GFR calc Af Amer 11/01/2018 >60  >60 mL/min Final   Anion gap 11/01/2018 8  5 - 15 Final   Performed at [facility], [address], [place], [place] [address]   WBC 11/01/2018 7.9  4.0 - 10.5 K/uL Final   RBC 11/01/2018 4.92  4.22 - 5.81 MIL/uL Final   Hemoglobin 11/01/2018 13.8  13.0 - 17.0 g/dL Final   HCT 11/01/2018 42.2  39.0 - 52.0 % Final   MCV 11/01/2018 85.8  80.0 - 100.0 fL Final   MCH 11/01/2018 28.0  26.0 - 34.0 pg Final   MCHC 11/01/2018 32.7  30.0 - 36.0 g/dL Final   RDW 11/01/2018 15.2  11.5 - 15.5 % Final   Platelets 11/01/2018 231  150 - 400 K/uL Final   nRBC 11/01/2018 0.0  0.0 - 0.2 % Final   Neutrophils Relative % 11/01/2018 79  % Final  Neutro Abs 11/01/2018 6.3  1.7 - 7.7 K/uL Final   Lymphocytes Relative 11/01/2018 10  % Final   Lymphs Abs 11/01/2018 0.8   0.7 - 4.0 K/uL Final   Monocytes Relative 11/01/2018 7  % Final   Monocytes Absolute 11/01/2018 0.5  0.1 - 1.0 K/uL Final   Eosinophils Relative 11/01/2018 3  % Final   Eosinophils Absolute 11/01/2018 0.3  0.0 - 0.5 K/uL Final   Basophils Relative 11/01/2018 1  % Final   Basophils Absolute 11/01/2018 0.1  0.0 - 0.1 K/uL Final   Immature Granulocytes 11/01/2018 0  % Final   Abs Immature Granulocytes 11/01/2018 0.03  0.00 - 0.07 K/uL Final   Performed at [facility], [address] [place], [place] [address]    Assessment:  Mikel Hardgrove is a 68 y.o. 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  Pathology revealed an invasive high-grade adenocarcinoma, with predominantly solid growth pattern.  The neoplastic cells were TTF-1 (+), Napsin A (+), and P40 (+).  He has a T4 vertebral metastasis.  Clinical stage is T4N1M1.  There was not enough material for Foundation One testing.  PDL-1 revealed TPS 90%.  Chest CT angiogram on 04/27/2018 revealed no evidence of significant pulmonary embolus.  There was a 4.1 cm mass in the superior segment right lower lobe posteriorly causing obliteration of the in coming bronchus and mild postobstructive change. There were multiple additional bilateral pulmonary nodules (< 1 cm - 1.4 cm).   There were enlarged right hilar lymph nodes.  There was metastasis to the T4 vertebra with mild associated compression.  There was emphysematous changes in the lungs.   PET scan on 04/30/2018 revealed a 3.4 cm hypermetabolic RLL pulmonary mass (SUV 13.2), 11 mm pulmonary nodule in the LEFT lung (SUV 4.5), RIGHT paratracheal lymph node (SUV 5.1), and hypermetabolic activity within the T4 vertebral body (SUV 10.2).   Thoracic spine MRI on 05/03/2018 revealed T4 metastasis with a 40% pathologic compression deformity and 5 mm of retropulsion of the vertebral body.  Retropulsion  results in mild spinal canal stenosis and mild bilateral C4-5 foraminal stenosis.  There was paravertebral soft tissue thickening from mid T3 to mid T5, likely representing edema related to the pathologic compression deformity vs. possible extraosseous extension of the neoplasm.  There were no additional thoracic spinal metastases noted.   Head MRI on 05/03/2018 revealed no intracranial metastatic disease.  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  Incidental mention made of mild paranasal sinus disease.  He is s/p cycle #7 pembrolizumab (05/24/2018 - 10/11/2018).  He is tolerating treatment well.  He completed T4 radiation on 07/07/2018.  He receives Xgeva monthly (06/03/2018 - 10/18/2018).  [facility] spine CT at [facility] on 08/17/2018 revealed pathologic compression fracture of T4 resulting in vertebra plana. Fracture fragments protrude into the spinal canal resulting in moderate to severe canal narrowing at T4.  There was severe right and moderate left foraminal narrowing at T4-T5 secondary to vertebra plana.  There were multiple spiculated pulmonary masses consistent with metastatic adenocarcinoma, similar in appearance to 04/30/2018 PET/CT.  Thoracic spine CT at [facility] on 10/29/2018 revealed unchanged severe compression deformity/vertebral vertebra plana of T4.  There was increased sclerotic changes within the collapsed vertebral body possibly due to interval radiation.  There was a stable degree of retropulsion and focal mild spinal canal stenosis at T4.  There was interval enlargement of the left and right upper lobe spiculated pulmonary nodules and right lower lobe mass.   Chest CT on 08/26/2018 revealed a 3.1 cm right lower lobe mass, corresponding to known primary bronchogenic neoplasm, mildly decreased.  Bilateral pulmonary  nodules/metastases, most of which are improved.  There was mildly progressive pleural-based nodularity in the right upper lobe along the minor fissure, including a dominant 17 x 11 mm nodule.  There was a small right pleural effusion, decreased.  There was a 15 mm left adrenal metastasis, decreased.    Symptomatically, he is doing well.  He notes minimal T4 pain.  He denies any respiratory symptoms.  Exam is stable.  Plan: 1. Labs today:  CBC with diff, CMP, TSH. 2. Metastatic high-grade adenocarcinoma the RIGHT lung Clinically doing well. Initially discussed proceeding with cycle #8 pembrolizumab then reimaging after this cycle. After review of thoracic spine CT, discussed postponing treatment and restaging studies. Discuss symptom management.  He has antiemetics and pain medications at home to use on a prn bases.  Interventions are adequate.    3. T4 osseous metastasis Patient followed at [facility]. Reviewed report of interval thoracic spine CT on 10/29/2018. Anticipate postponing surgery until after COVID-19 pandemic. Follow-up as scheduled with [facility] neurosurgery. Continue monthly denosumab (due 11/15/2018). Continue calcium 1200 mg vitamin D 800 IU daily. 4. Pain and symptom management  Pain well controlled as patient not on his feet working with current COVID-19 pandemic. Continue current regimen:  Duragesic 50 mcg/hr patch Oxicodone 10 - 20 mg every 4-6 hours PRN.  5. Protein calorie malnutrition Patient gaining weight. Weight today is 158 lb 9.9 oz (72 kg). His BMI of 24.84 kg/m places him in the normal weight category.  Encouraged intake of calorie and protein dense food choices.  Continue nutritional supplement shakes 2-3 times a day  Patient drinking Ensure Enlive which provides 350 calories, 11 g fat, 44 g of carbohydrates, and 20 g protein. 6.   STAT chest, abdomen, and pelvic CT scan (? tomorrow)- restaging. 7.   RTC in 1 week for MD assessment, labs (CBC with diff, CMP, Mg),  review of CT scans, and pembrolizumab.  I discussed the assessment and treatment plan with the patient.  The patient was provided an opportunity to ask questions and all were answered.  The patient agreed with the plan and demonstrated an understanding of the instructions.  The patient was advised to call back or seek an in person evaluation if the symptoms worsen or if the condition fails to improve as anticipated.  Addendum:  Chest, abdomen, and pelvic CT on 11/02/2018 revealed the dominant right lower lobe mass has slightly enlarged compared with the most recent CT of 2 months ago, although demonstrated central necrosis.  Current size was more similar to that demonstrated on the PET-CT.  Additional smaller pulmonary nodules were stable to slightly improved. No definite new nodules. Small right hilar and mediastinal lymph nodes were stable.  There was persistent pleural thickening and small loculated right pleural effusion.  There was stable pathologic fracture at T4. No new osseous metastases or fractures identified.  There was no evidence of metastatic disease in the abdomen or pelvis.  The patient was contacted on the phone.  Images were reviewed with Dr Kathlene Cote.  Lesions are smaller.  There was central necrosis in the RLL lesion.  There is no evidence of progressive disease.  Plan is to continue current pembrolizumab without the addition of chemotherapy.   Lequita Asal, MD  11/01/2018, 10:47 AM

## 2018-11-01 ENCOUNTER — Other Ambulatory Visit: Payer: Self-pay | Admitting: *Deleted

## 2018-11-01 ENCOUNTER — Inpatient Hospital Stay: Payer: Medicare Other

## 2018-11-01 ENCOUNTER — Encounter: Payer: Self-pay | Admitting: Hematology and Oncology

## 2018-11-01 ENCOUNTER — Inpatient Hospital Stay (HOSPITAL_BASED_OUTPATIENT_CLINIC_OR_DEPARTMENT_OTHER): Payer: Medicare Other | Admitting: Hematology and Oncology

## 2018-11-01 ENCOUNTER — Inpatient Hospital Stay: Payer: Medicare Other | Attending: Hematology and Oncology

## 2018-11-01 VITALS — BP 98/66 | HR 76 | Temp 98.7°F | Resp 18 | Ht 67.0 in | Wt 158.6 lb

## 2018-11-01 DIAGNOSIS — C7972 Secondary malignant neoplasm of left adrenal gland: Secondary | ICD-10-CM | POA: Diagnosis not present

## 2018-11-01 DIAGNOSIS — Z923 Personal history of irradiation: Secondary | ICD-10-CM | POA: Diagnosis not present

## 2018-11-01 DIAGNOSIS — Z79899 Other long term (current) drug therapy: Secondary | ICD-10-CM | POA: Diagnosis not present

## 2018-11-01 DIAGNOSIS — Z5112 Encounter for antineoplastic immunotherapy: Secondary | ICD-10-CM | POA: Diagnosis not present

## 2018-11-01 DIAGNOSIS — C3431 Malignant neoplasm of lower lobe, right bronchus or lung: Secondary | ICD-10-CM

## 2018-11-01 DIAGNOSIS — C7801 Secondary malignant neoplasm of right lung: Secondary | ICD-10-CM | POA: Diagnosis not present

## 2018-11-01 DIAGNOSIS — R634 Abnormal weight loss: Secondary | ICD-10-CM

## 2018-11-01 DIAGNOSIS — C7951 Secondary malignant neoplasm of bone: Secondary | ICD-10-CM | POA: Diagnosis not present

## 2018-11-01 DIAGNOSIS — G893 Neoplasm related pain (acute) (chronic): Secondary | ICD-10-CM

## 2018-11-01 LAB — CBC WITH DIFFERENTIAL/PLATELET
Abs Immature Granulocytes: 0.03 10*3/uL (ref 0.00–0.07)
Basophils Absolute: 0.1 10*3/uL (ref 0.0–0.1)
Basophils Relative: 1 %
Eosinophils Absolute: 0.3 10*3/uL (ref 0.0–0.5)
Eosinophils Relative: 3 %
HCT: 42.2 % (ref 39.0–52.0)
Hemoglobin: 13.8 g/dL (ref 13.0–17.0)
Immature Granulocytes: 0 %
Lymphocytes Relative: 10 %
Lymphs Abs: 0.8 10*3/uL (ref 0.7–4.0)
MCH: 28 pg (ref 26.0–34.0)
MCHC: 32.7 g/dL (ref 30.0–36.0)
MCV: 85.8 fL (ref 80.0–100.0)
Monocytes Absolute: 0.5 10*3/uL (ref 0.1–1.0)
Monocytes Relative: 7 %
Neutro Abs: 6.3 10*3/uL (ref 1.7–7.7)
Neutrophils Relative %: 79 %
Platelets: 231 10*3/uL (ref 150–400)
RBC: 4.92 MIL/uL (ref 4.22–5.81)
RDW: 15.2 % (ref 11.5–15.5)
WBC: 7.9 10*3/uL (ref 4.0–10.5)
nRBC: 0 % (ref 0.0–0.2)

## 2018-11-01 LAB — COMPREHENSIVE METABOLIC PANEL
ALT: 15 U/L (ref 0–44)
AST: 24 U/L (ref 15–41)
Albumin: 4 g/dL (ref 3.5–5.0)
Alkaline Phosphatase: 67 U/L (ref 38–126)
Anion gap: 8 (ref 5–15)
BUN: 15 mg/dL (ref 8–23)
CO2: 24 mmol/L (ref 22–32)
Calcium: 9.2 mg/dL (ref 8.9–10.3)
Chloride: 103 mmol/L (ref 98–111)
Creatinine, Ser: 0.9 mg/dL (ref 0.61–1.24)
GFR calc Af Amer: >60 mL/min (ref >60)
GFR calc non Af Amer: >60 mL/min (ref >60)
Glucose, Bld: 187 mg/dL — ABNORMAL HIGH (ref 70–99)
Potassium: 3.5 mmol/L (ref 3.5–5.1)
Sodium: 135 mmol/L (ref 135–145)
Total Bilirubin: 1.2 mg/dL (ref 0.3–1.2)
Total Protein: 7.3 g/dL (ref 6.5–8.1)

## 2018-11-01 LAB — TSH: TSH: 2.106 u[IU]/mL (ref 0.350–4.500)

## 2018-11-01 MED ORDER — SODIUM CHLORIDE 0.9% FLUSH
10.0000 mL | INTRAVENOUS | Status: DC | PRN
  Administered 2018-11-01: 10 mL via INTRAVENOUS
  Filled 2018-11-01: qty 10

## 2018-11-01 MED ORDER — HEPARIN SOD (PORK) LOCK FLUSH 100 UNIT/ML IV SOLN
500.0000 [IU] | Freq: Once | INTRAVENOUS | Status: AC
  Administered 2018-11-01: 500 [IU] via INTRAVENOUS

## 2018-11-01 MED ORDER — OXYCODONE HCL 10 MG PO TABS: 10.0000 mg | ORAL_TABLET | ORAL | 0 refills | Status: DC | PRN

## 2018-11-01 NOTE — Telephone Encounter (Signed)
Pt requests refill of oxycodone.

## 2018-11-01 NOTE — Progress Notes (Signed)
No new changes noted today 

## 2018-11-02 ENCOUNTER — Ambulatory Visit
Admission: RE | Admit: 2018-11-02 | Discharge: 2018-11-02 | Disposition: A | Discharge status: Home / Self Care | Payer: Medicare Other | Source: Ambulatory Visit | Attending: Hematology and Oncology | Admitting: Hematology and Oncology

## 2018-11-02 ENCOUNTER — Other Ambulatory Visit: Payer: Self-pay

## 2018-11-02 DIAGNOSIS — C3431 Malignant neoplasm of lower lobe, right bronchus or lung: Secondary | ICD-10-CM

## 2018-11-02 DIAGNOSIS — C3491 Malignant neoplasm of unspecified part of right bronchus or lung: Secondary | ICD-10-CM | POA: Diagnosis not present

## 2018-11-02 DIAGNOSIS — J90 Pleural effusion, not elsewhere classified: Secondary | ICD-10-CM | POA: Diagnosis not present

## 2018-11-02 DIAGNOSIS — C7951 Secondary malignant neoplasm of bone: Secondary | ICD-10-CM | POA: Diagnosis not present

## 2018-11-02 LAB — T4: T4, Total: 8.5 ug/dL (ref 4.5–12.0)

## 2018-11-02 MED ORDER — IOHEXOL 300 MG/ML  SOLN
100.0000 mL | Freq: Once | INTRAMUSCULAR | Status: AC | PRN
  Administered 2018-11-02: 09:00:00 100 mL via INTRAVENOUS

## 2018-11-07 NOTE — Progress Notes (Signed)
[facility] [address], [place], [place] [address] Phone: [phone]  Fax: [phone]    Clinic day:  11/08/2018   Referring physician:  [name], NP   Chief Complaint: Jimmy West is a 68 y.o. male with metastatic adenocarcinoma of the lung who is seen for assessment prior to cycle #8 pembrolizumab.  HPI:  The patient was last seen in the medical oncology clinic on 11/01/2018.  At that time, he was doing well.  He noted minimal T4 pain.  He denied any respiratory symptoms.  Exam was stable.  Thoracic spine CT at [facility] suggested enlarging lung nodules.  Treatment was held.  Restaging studies were ordered.  Chest, abdomen, and pelvic CT on 11/02/2018 revealed the dominant right lower lobe mass has slightly enlarged compared with the most recent CT of 2 months ago, although demonstrated central necrosis.  Current size was more similar to that demonstrated on the PET-CT.  Additional smaller pulmonary nodules were stable to slightly improved. No definite new nodules. Small right hilar and mediastinal lymph nodes were stable.  There was persistent pleural thickening and small loculated right pleural effusion.  There was stable pathologic fracture at T4. No new osseous metastases or fractures identified.  There was no evidence of metastatic disease in the abdomen or pelvis.  Images were reviewed with Dr Kathlene Cote.  Lesions were smaller.  There was central necrosis in the RLL lesion.  There is no evidence of progressive disease.  During the interim, he has done well.  He notes no symptoms.  He denies any back pain; pain is aggravated when he works.  He is currently not working secondary to the COVID-19 pandemic.   Past Medical History:  Diagnosis Date   Bulging lumbar disc    Cancer ([place])    Heart attack ([place])     Past Surgical History:  Procedure Laterality Date   CARDIAC CATHETERIZATION     two stents   KNEE SURGERY Left     PORTA CATH INSERTION N/A 05/21/2018   Procedure: PORTA CATH INSERTION;  Surgeon: Algernon Huxley, MD;  Location: [facility];  Service: Cardiovascular;  Laterality: N/A;    Family History  Problem Relation Age of Onset   Heart failure Father    Cancer Maternal Aunt    Heart failure Maternal Uncle    Dementia Paternal Grandmother    Cancer Maternal Aunt     Social History:  reports that he quit smoking about 15 years ago. His smoking use included cigarettes. He has a 22.50 pack-year smoking history. He has never used smokeless tobacco. He reports current alcohol use of about 15.0 standard drinks of alcohol per week. He reports that he does not use drugs. He started smoking at age 11.   He is smoking 1 1/2 packs/day.  Patient is employed as as full time Probation officer. Patient denies known exposures to radiation on toxins. The patient is alone today.  Allergies: No Known Allergies  Current Medications: Current Outpatient Medications  Medication Sig Dispense Refill   acetaminophen (TYLENOL) 500 MG tablet Take 500 mg by mouth 3 (three) times daily as needed.     aspirin EC 81 MG tablet Take 81 mg by mouth daily.     Calcium 600-400 MG-UNIT CHEW Chew 2 tablets by mouth daily.     fentaNYL (DURAGESIC) 50 MCG/HR Place 1 patch onto the skin every 3 (three) days. 10 patch 0   ibuprofen (ADVIL,MOTRIN) 200 MG tablet Take  200 mg by mouth every 6 (six) hours as needed.     lidocaine-prilocaine (EMLA) cream Apply to affected area once 30 g 3   Oxycodone HCl 10 MG TABS Take 1-2 tablets (10-20 mg total) by mouth every 4 (four) hours as needed. 90 tablet 0   pembrolizumab (KEYTRUDA) 100 MG/4ML SOLN Inject 2 mg/kg into the vein every 30 (thirty) days.      polyethylene glycol (MIRALAX / GLYCOLAX) packet Take 17 g by mouth daily.     senna (SENOKOT) 8.6 MG tablet Take 1 tablet by mouth as needed.      naloxone (NARCAN) nasal spray 4 mg/0.1 mL 1 spray by nose in the event of opioid  overdose. Call EMS (911) immediately if medication is used. (Patient not taking: Reported on 10/13/2018) 1 kit 0   ondansetron (ZOFRAN) 8 MG tablet Take 1 tablet (8 mg total) by mouth 2 (two) times daily as needed (Nausea or vomiting). Start if needed on the third day after chemotherapy. (Patient not taking: Reported on 10/13/2018) 30 tablet 1   No current facility-administered medications for this visit.     Review of Systems  Constitutional: Negative.  Negative for chills, diaphoresis, fever, malaise/fatigue and weight loss (fluctuates; down 2 pounds).       No complaints.  HENT: Negative.  Negative for congestion, ear pain, nosebleeds, sinus pain and sore throat.   Eyes: Negative.  Negative for blurred vision, double vision, photophobia and pain.  Respiratory: Negative.  Negative for cough, hemoptysis, sputum production, shortness of breath and wheezing.   Cardiovascular: Negative.  Negative for chest pain, palpitations, orthopnea, leg swelling and PND.  Gastrointestinal: Negative.  Negative for abdominal pain, blood in stool, constipation, diarrhea, heartburn, melena, nausea and vomiting.  Genitourinary: Negative.  Negative for dysuria, frequency, hematuria and urgency.  Musculoskeletal: Positive for back pain (when working). Negative for falls, joint pain, myalgias and neck pain.  Skin: Negative.  Negative for itching and rash.  Neurological: Positive for sensory change (chronic numbness to LEFT heel 2/2 spinal disc issues). Negative for dizziness, tremors, speech change, focal weakness, weakness and headaches.  Endo/Heme/Allergies: Negative.  Does not bruise/bleed easily.  Psychiatric/Behavioral: Negative.  Negative for depression and memory loss. The patient is not nervous/anxious and does not have insomnia.   All other systems reviewed and are negative.  Performance status (ECOG): 1  Vital Signs BP 120/76 (BP Location: Left Arm, Patient Position: Sitting)    Pulse 90    Temp 98.1 F  (36.7 C) (Oral)    Resp 18    Wt 156 lb 1.4 oz (70.8 kg)    BMI 24.45 kg/m   Physical Exam  Constitutional: He is oriented to person, place, and time and well-developed, well-nourished, and in no distress. No distress.  HENT:  Head: Normocephalic and atraumatic.  Mouth/Throat: Oropharynx is clear and moist and mucous membranes are normal. He has dentures. No oropharyngeal exudate.  Brown hair with slight graying.  Mustache.  Eyes: Pupils are equal, round, and reactive to light. Conjunctivae and EOM are normal. No scleral icterus.  Neck: Normal range of motion. Neck supple. No JVD present.  Cardiovascular: Normal rate, regular rhythm and normal heart sounds. Exam reveals no gallop and no friction rub.  No murmur heard. Pulmonary/Chest: Effort normal and breath sounds normal. No respiratory distress. He has no wheezes. He has no rales.  Abdominal: Soft. Bowel sounds are normal. He exhibits no distension and no mass. There is no abdominal tenderness. There is no rebound  and no guarding.  Musculoskeletal: Normal range of motion.        General: No edema.     Thoracic back: He exhibits no tenderness (T4 vertebral body metastasis non painful to palpation).  Lymphadenopathy:    He has no cervical adenopathy.    He has no axillary adenopathy.       Right: No supraclavicular adenopathy present.       Left: No supraclavicular adenopathy present.  Neurological: He is alert and oriented to person, place, and time. Gait normal.  Skin: Skin is warm and dry. No rash noted. He is not diaphoretic. No erythema. No pallor.  Psychiatric: Mood, affect and judgment normal.  Nursing note and vitals reviewed.   Imaging studies: 04/27/2018:  Chest CT angiogram revealed no evidence of significant pulmonary embolus.  There was a 4.1 cm mass in the superior segment right lower lobe posteriorly causing obliteration of the in coming bronchus and mild postobstructive change. There were multiple additional bilateral  pulmonary nodules (< 1 cm - 1.4 cm).   There were enlarged right hilar lymph nodes.  There was metastasis to the T4 vertebra with mild associated compression.  There was emphysematous changes in the lungs. 04/30/2018:  PET scan revealed a 3.4 cm hypermetabolic RLL pulmonary mass (SUV 13.2), 11 mm pulmonary nodule in the LEFT lung (SUV 4.5), RIGHT paratracheal lymph node (SUV 5.1), and hypermetabolic activity within the T4 vertebral body (SUV 10.2).  05/03/2018:  Thoracic spine MRI revealed T4 metastasis with a 40% pathologic compression deformity and 5 mm of retropulsion of the vertebral body.  Retropulsion results in mild spinal canal stenosis and mild bilateral C4-5 foraminal stenosis.  There was paravertebral soft tissue thickening from mid T3 to mid T5, likely representing edema related to the pathologic compression deformity vs. possible extraosseous extension of the neoplasm.  There were no additional thoracic spinal metastases noted.  05/03/2018:  Head MRI revealed no intracranial metastatic disease.  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  Incidental mention made of mild paranasal sinus disease. 08/17/2018:  Thoracic spine CT at [facility] on 08/17/2018 revealed pathologic compression fracture of T4 resulting in vertebra plana. Fracture fragments protrude into the spinal canal resulting in moderate to severe canal narrowing at T4.  There was severe right and moderate left foraminal narrowing at T4-T5 secondary to vertebra plana.  There were multiple spiculated pulmonary masses consistent with metastatic adenocarcinoma, similar in appearance to 04/30/2018 PET/CT.   08/26/2018:  Chest CT revealed a 3.1 cm right lower lobe mass, corresponding to known primary bronchogenic neoplasm, mildly decreased.  Bilateral pulmonary nodules/metastases,  most of which are improved.  There was mildly progressive pleural-based nodularity in the right upper lobe along the minor fissure, including a dominant 17 x 11 mm nodule.  There was a small right pleural effusion, decreased.  There was a 15 mm left adrenal metastasis, decreased.   10/29/2018:  Thoracic spine CT at [facility] revealed unchanged severe compression deformity/vertebral vertebra plana of T4.  There was increased sclerotic changes within the collapsed vertebral body possibly due to interval radiation.  There was a stable degree of retropulsion and focal mild spinal canal stenosis at T4.  There was interval enlargement of the left and right upper lobe spiculated pulmonary nodules and right lower lobe mass.  11/02/2018:  Chest, abdomen, and pelvic CT revealed the dominant right  lower lobe mass has slightly enlarged compared with the most recent CT of 2 months ago, although demonstrated central necrosis.  Current size was more similar to that demonstrated on the PET-CT.  Additional smaller pulmonary nodules were stable to slightly improved. No definite new nodules. Small right hilar and mediastinal lymph nodes were stable.  There was persistent pleural thickening and small loculated right pleural effusion.  There was stable pathologic fracture at T4. No new osseous metastases or fractures identified.  There was no evidence of metastatic disease in the abdomen or pelvis.  Images were reviewed with Dr Kathlene Cote.  Lung lesions were smaller.  There was central necrosis in the RLL lesion.  There was no evidence of progressive disease.   Appointment on 11/08/2018  Component Date Value Ref Range Status   Magnesium 11/08/2018 2.2  1.7 - 2.4 mg/dL Final   Performed at [facility], [address], [place], [place] [address]   Sodium 11/08/2018 136  135 - 145 mmol/L Final   Potassium 11/08/2018 4.0  3.5 - 5.1 mmol/L Final   Chloride 11/08/2018 104  98 - 111 mmol/L Final   CO2 11/08/2018 24  22  - 32 mmol/L Final   Glucose, Bld 11/08/2018 151* 70 - 99 mg/dL Final   BUN 11/08/2018 13  8 - 23 mg/dL Final   Creatinine, Ser 11/08/2018 0.94  0.61 - 1.24 mg/dL Final   Calcium 11/08/2018 9.0  8.9 - 10.3 mg/dL Final   Total Protein 11/08/2018 7.8  6.5 - 8.1 g/dL Final   Albumin 11/08/2018 4.3  3.5 - 5.0 g/dL Final   AST 11/08/2018 22  15 - 41 U/L Final   ALT 11/08/2018 16  0 - 44 U/L Final   Alkaline Phosphatase 11/08/2018 68  38 - 126 U/L Final   Total Bilirubin 11/08/2018 1.1  0.3 - 1.2 mg/dL Final   GFR calc non Af Amer 11/08/2018 >60  >60 mL/min Final   GFR calc Af Amer 11/08/2018 >60  >60 mL/min Final   Anion gap 11/08/2018 8  5 - 15 Final   Performed at [facility], [address], [place], [place] [address]   WBC 11/08/2018 8.4  4.0 - 10.5 K/uL Final   RBC 11/08/2018 5.30  4.22 - 5.81 MIL/uL Final   Hemoglobin 11/08/2018 15.1  13.0 - 17.0 g/dL Final   HCT 11/08/2018 45.5  39.0 - 52.0 % Final   MCV 11/08/2018 85.8  80.0 - 100.0 fL Final   MCH 11/08/2018 28.5  26.0 - 34.0 pg Final   MCHC 11/08/2018 33.2  30.0 - 36.0 g/dL Final   RDW 11/08/2018 15.5  11.5 - 15.5 % Final   Platelets 11/08/2018 254  150 - 400 K/uL Final   nRBC 11/08/2018 0.0  0.0 - 0.2 % Final   Neutrophils Relative % 11/08/2018 76  % Final   Neutro Abs 11/08/2018 6.4  1.7 - 7.7 K/uL Final   Lymphocytes Relative 11/08/2018 11  % Final   Lymphs Abs 11/08/2018 0.9  0.7 - 4.0 K/uL Final   Monocytes Relative 11/08/2018 8  % Final   Monocytes Absolute 11/08/2018 0.7  0.1 - 1.0 K/uL Final   Eosinophils Relative 11/08/2018 4  % Final   Eosinophils Absolute 11/08/2018 0.4  0.0 - 0.5 K/uL Final   Basophils Relative 11/08/2018 1  % Final   Basophils Absolute 11/08/2018 0.1  0.0 - 0.1 K/uL Final   Immature Granulocytes 11/08/2018 0  % Final   Abs  Immature Granulocytes 11/08/2018 0.03  0.00 - 0.07 K/uL Final   Performed at [facility], [address], [place], [place] [address]    Assessment:  Jimmy West is a 68 y.o. 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  Pathology revealed an invasive high-grade adenocarcinoma, with predominantly solid growth pattern.  The neoplastic cells were TTF-1 (+), Napsin A (+), and P40 (+).  He has a T4 vertebral metastasis.  Clinical stage is T4N1M1.  There was not enough material for [facility] One testing.  PDL-1 revealed TPS 90%.  PET scan on 04/30/2018 revealed a 3.4 cm hypermetabolic RLL pulmonary mass (SUV 13.2), 11 mm pulmonary nodule in the LEFT lung (SUV 4.5), RIGHT paratracheal lymph node (SUV 5.1), and hypermetabolic activity within the T4 vertebral body (SUV 10.2).   Thoracic spine MRI on 05/03/2018 revealed T4 metastasis with a 40% pathologic compression deformity and 5 mm of retropulsion of the vertebral body.  Retropulsion results in mild spinal canal stenosis and mild bilateral C4-5 foraminal stenosis.  There was paravertebral soft tissue thickening from mid T3 to mid T5, likely representing edema related to the pathologic compression deformity vs. possible extraosseous extension of the neoplasm.  There were no additional thoracic spinal metastases noted.   Head MRI on 05/03/2018 revealed no intracranial metastatic disease.  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  Incidental mention made of mild paranasal sinus disease.  He is s/p cycle #7 pembrolizumab (05/24/2018 - 10/11/2018).  He is tolerating treatment well.  CEA was 1.3 on 08/30/2018.  He completed T4 radiation on 07/07/2018.  He receives Xgeva monthly (06/03/2018 - 10/18/2018).  Thoracic spine CT at [facility] on 10/29/2018 revealed unchanged severe compression deformity/vertebral vertebra  plana of T4.  There was increased sclerotic changes within the collapsed vertebral body possibly due to interval radiation.  There was a stable degree of retropulsion and focal mild spinal canal stenosis at T4.  There was interval enlargement of the left and right upper lobe spiculated pulmonary nodules and right lower lobe mass.   Chest, abdomen, and pelvic CT on 11/02/2018 revealed the dominant right lower lobe mass has slightly enlarged compared with the most recent CT of 2 months ago, although demonstrated central necrosis.  Current size was more similar to that demonstrated on the PET-CT.  Additional smaller pulmonary nodules were stable to slightly improved. No definite new nodules. Small right hilar and mediastinal lymph nodes were stable.  There was persistent pleural thickening and small loculated right pleural effusion.  There was stable pathologic fracture at T4. No new osseous metastases or fractures identified.  There was no evidence of metastatic disease in the abdomen or pelvis.  Images were reviewed with Dr Kathlene Cote.  Lesions were smaller.  There was central necrosis in the RLL lesion.  There was no evidence of progressive disease.  Symptomatically, he is doing well.  He denies any back pain.  Exam is stable.  Plan: 1. Labs today:  CBC with diff, CMP, Mg. 2. Metastatic high-grade adenocarcinoma the RIGHT lung Clinically doing well. Review interval chest, abdomen, and pelvic CT.  Images personally reviewed with radiology.  Scans reveal no evidence of progression.  Lesions are smaller.  There is evidence of central necrosis in the RLL lesion. Discuss plans for continuation of  therapy. Cycle # 8 pembrolizumab today. Discuss symptom management.  He has antiemetics and pain medications at home to use on a prn bases.  Interventions are adequate.    3. T4 osseous metastasis Patient followed at [facility]. Surgery is postponed until after COVID-19 pandemic. Patient is scheduled for follow-up with  [facility] on 01/31/2019. Continue monthly denosumab (due 11/15/2018). Continue calcium 1200 mg vitamin D 800 IU daily. 4. Pain and symptom management  Pain well controlled as patient not on his feet working with current COVID-19 pandemic. Discuss plan to continue current regimen:  Duragesic 50 mcg/hr patch Oxicodone 10 - 20 mg every 4-6 hours PRN.  5.   RTC in 3 weeks for MD assessment, labs (CBC with diff, CMP, LDH, TSH), and cycle #9 pembrolizumab.  I discussed the assessment and treatment plan with the patient.  The patient was provided an opportunity to ask questions and all were answered.  The patient agreed with the plan and demonstrated an understanding of the instructions.  The patient was advised to call back or seek an in person evaluation if the symptoms worsen or if the condition fails to improve as anticipated.   Lequita Asal, MD  11/08/2018, 1:58 PM

## 2018-11-08 ENCOUNTER — Other Ambulatory Visit: Payer: Self-pay

## 2018-11-08 ENCOUNTER — Inpatient Hospital Stay (HOSPITAL_BASED_OUTPATIENT_CLINIC_OR_DEPARTMENT_OTHER): Payer: Medicare Other | Admitting: Hematology and Oncology

## 2018-11-08 ENCOUNTER — Ambulatory Visit: Payer: Medicare Other

## 2018-11-08 ENCOUNTER — Inpatient Hospital Stay: Payer: Medicare Other

## 2018-11-08 ENCOUNTER — Encounter: Payer: Self-pay | Admitting: Hematology and Oncology

## 2018-11-08 VITALS — BP 120/76 | HR 90 | Temp 98.1°F | Resp 18 | Wt 156.1 lb

## 2018-11-08 VITALS — BP 109/65 | HR 64 | Temp 97.0°F | Resp 18

## 2018-11-08 DIAGNOSIS — C7801 Secondary malignant neoplasm of right lung: Secondary | ICD-10-CM | POA: Diagnosis not present

## 2018-11-08 DIAGNOSIS — C3431 Malignant neoplasm of lower lobe, right bronchus or lung: Secondary | ICD-10-CM

## 2018-11-08 DIAGNOSIS — Z5112 Encounter for antineoplastic immunotherapy: Secondary | ICD-10-CM | POA: Diagnosis not present

## 2018-11-08 DIAGNOSIS — Z923 Personal history of irradiation: Secondary | ICD-10-CM | POA: Diagnosis not present

## 2018-11-08 DIAGNOSIS — C7951 Secondary malignant neoplasm of bone: Secondary | ICD-10-CM | POA: Diagnosis not present

## 2018-11-08 DIAGNOSIS — Z79899 Other long term (current) drug therapy: Secondary | ICD-10-CM

## 2018-11-08 DIAGNOSIS — C7972 Secondary malignant neoplasm of left adrenal gland: Secondary | ICD-10-CM | POA: Diagnosis not present

## 2018-11-08 DIAGNOSIS — G893 Neoplasm related pain (acute) (chronic): Secondary | ICD-10-CM | POA: Diagnosis not present

## 2018-11-08 LAB — CBC WITH DIFFERENTIAL/PLATELET
Abs Immature Granulocytes: 0.03 10*3/uL (ref 0.00–0.07)
Basophils Absolute: 0.1 10*3/uL (ref 0.0–0.1)
Basophils Relative: 1 %
Eosinophils Absolute: 0.4 10*3/uL (ref 0.0–0.5)
Eosinophils Relative: 4 %
HCT: 45.5 % (ref 39.0–52.0)
Hemoglobin: 15.1 g/dL (ref 13.0–17.0)
Immature Granulocytes: 0 %
Lymphocytes Relative: 11 %
Lymphs Abs: 0.9 10*3/uL (ref 0.7–4.0)
MCH: 28.5 pg (ref 26.0–34.0)
MCHC: 33.2 g/dL (ref 30.0–36.0)
MCV: 85.8 fL (ref 80.0–100.0)
Monocytes Absolute: 0.7 10*3/uL (ref 0.1–1.0)
Monocytes Relative: 8 %
Neutro Abs: 6.4 10*3/uL (ref 1.7–7.7)
Neutrophils Relative %: 76 %
Platelets: 254 10*3/uL (ref 150–400)
RBC: 5.3 MIL/uL (ref 4.22–5.81)
RDW: 15.5 % (ref 11.5–15.5)
WBC: 8.4 10*3/uL (ref 4.0–10.5)
nRBC: 0 % (ref 0.0–0.2)

## 2018-11-08 LAB — COMPREHENSIVE METABOLIC PANEL
ALT: 16 U/L (ref 0–44)
AST: 22 U/L (ref 15–41)
Albumin: 4.3 g/dL (ref 3.5–5.0)
Alkaline Phosphatase: 68 U/L (ref 38–126)
Anion gap: 8 (ref 5–15)
BUN: 13 mg/dL (ref 8–23)
CO2: 24 mmol/L (ref 22–32)
Calcium: 9 mg/dL (ref 8.9–10.3)
Chloride: 104 mmol/L (ref 98–111)
Creatinine, Ser: 0.94 mg/dL (ref 0.61–1.24)
GFR calc Af Amer: >60 mL/min (ref >60)
GFR calc non Af Amer: >60 mL/min (ref >60)
Glucose, Bld: 151 mg/dL — ABNORMAL HIGH (ref 70–99)
Potassium: 4 mmol/L (ref 3.5–5.1)
Sodium: 136 mmol/L (ref 135–145)
Total Bilirubin: 1.1 mg/dL (ref 0.3–1.2)
Total Protein: 7.8 g/dL (ref 6.5–8.1)

## 2018-11-08 LAB — MAGNESIUM: Magnesium: 2.2 mg/dL (ref 1.7–2.4)

## 2018-11-08 MED ORDER — SODIUM CHLORIDE 0.9% FLUSH
10.0000 mL | INTRAVENOUS | Status: DC | PRN
  Administered 2018-11-08: 10 mL
  Filled 2018-11-08: qty 10

## 2018-11-08 MED ORDER — SODIUM CHLORIDE 0.9 % IV SOLN
Freq: Once | INTRAVENOUS | Status: AC
  Administered 2018-11-08: 14:00:00 via INTRAVENOUS
  Filled 2018-11-08: qty 250

## 2018-11-08 MED ORDER — HEPARIN SOD (PORK) LOCK FLUSH 100 UNIT/ML IV SOLN
500.0000 [IU] | Freq: Once | INTRAVENOUS | Status: AC | PRN
  Administered 2018-11-08: 500 [IU]

## 2018-11-08 MED ORDER — SODIUM CHLORIDE 0.9 % IV SOLN
200.0000 mg | Freq: Once | INTRAVENOUS | Status: AC
  Administered 2018-11-08: 15:00:00 200 mg via INTRAVENOUS
  Filled 2018-11-08: qty 8

## 2018-11-08 NOTE — Progress Notes (Signed)
Pt here for follow up. Denies any concerns at this time.  

## 2018-11-15 ENCOUNTER — Inpatient Hospital Stay (HOSPITAL_BASED_OUTPATIENT_CLINIC_OR_DEPARTMENT_OTHER): Payer: Medicare Other | Admitting: Oncology

## 2018-11-15 ENCOUNTER — Other Ambulatory Visit: Payer: Self-pay

## 2018-11-15 DIAGNOSIS — G893 Neoplasm related pain (acute) (chronic): Secondary | ICD-10-CM

## 2018-11-15 DIAGNOSIS — Z79899 Other long term (current) drug therapy: Secondary | ICD-10-CM | POA: Diagnosis not present

## 2018-11-15 DIAGNOSIS — Z923 Personal history of irradiation: Secondary | ICD-10-CM

## 2018-11-15 DIAGNOSIS — C7801 Secondary malignant neoplasm of right lung: Secondary | ICD-10-CM | POA: Diagnosis not present

## 2018-11-15 DIAGNOSIS — C3431 Malignant neoplasm of lower lobe, right bronchus or lung: Secondary | ICD-10-CM | POA: Diagnosis not present

## 2018-11-15 DIAGNOSIS — Z5112 Encounter for antineoplastic immunotherapy: Secondary | ICD-10-CM | POA: Diagnosis not present

## 2018-11-15 DIAGNOSIS — Z515 Encounter for palliative care: Secondary | ICD-10-CM

## 2018-11-15 DIAGNOSIS — C7972 Secondary malignant neoplasm of left adrenal gland: Secondary | ICD-10-CM | POA: Diagnosis not present

## 2018-11-15 DIAGNOSIS — C7951 Secondary malignant neoplasm of bone: Secondary | ICD-10-CM | POA: Diagnosis not present

## 2018-11-15 MED ORDER — OXYCODONE HCL 10 MG PO TABS: 10.0000 mg | ORAL_TABLET | ORAL | 0 refills | Status: DC | PRN

## 2018-11-15 MED ORDER — FENTANYL 25 MCG/HR TD PT72: 1.0000 | MEDICATED_PATCH | TRANSDERMAL | 0 refills | Status: DC

## 2018-11-15 NOTE — Progress Notes (Signed)
[name]  [phone] [phone]   Name: Jimmy West Date: 11/15/2018 MRN: [id number]  DOB: 1951-03-27  Patient Care Team: [name], NP as PCP - General (Nurse Practitioner) Telford Nab, RN as Registered Nurse [place], Gerda Diss, MD as Attending Physician (Emergency Medicine)    REASON FOR CONSULTATION: Palliative care follow-up for this 68 year old male with multiple medical problems including presumed metastatic lung cancer.  Initially, patient presented with pulmonary nodules from chest x-ray ordered by PCP.  Subsequently referred to oncology for work-up and was evaluated by Dr. Mike Gip on 04/30/2018.  PET scan revealed several hypermetabolic pulmonary nodules and activity within the T4 vertebral body.  Follow-up MRI of the spine revealed T4 metastasis with pathological compression deformity and mild spinal canal stenosis with probable edema and extraosseous extension of neoplasm from mid T3 to mid T4.  He was referred to palliative care clinic to assist with symptom management and to establish treatment goals.  He met with palliative care nurse practitioner Vonna Kotyk borders most recently on 10/13/2018 to assess pain management/tolerance.  He was encouraged to continue oxycodone 10 to 20 mg every 4 to 6 hours as needed and to continue transdermal fentanyl patches 50 mcg every 72 hours.  Encouraged to continue prophylactic bowel regimen and to review advanced care planning documents.  Scheduled to return to clinic in 1 to 2 months.  SOCIAL HISTORY:    Patient has not married and has no children.  He has a brother who lives out of state.  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  ADVANCE DIRECTIVES:  Does not have.  CODE STATUS: Full code  PAST MEDICAL HISTORY: Past Medical History:  Diagnosis Date   Bulging lumbar disc    Cancer ([place])    Heart attack ([place])      PAST SURGICAL HISTORY:  Past Surgical History:  Procedure Laterality Date   CARDIAC CATHETERIZATION     two stents   KNEE SURGERY Left    PORTA CATH INSERTION N/A 05/21/2018   Procedure: PORTA CATH INSERTION;  Surgeon: Algernon Huxley, MD;  Location: [facility];  Service: Cardiovascular;  Laterality: N/A;    HEMATOLOGY/ONCOLOGY HISTORY:    Primary cancer of right lower lobe of lung ([place])   04/26/2018 Initial Diagnosis    Primary cancer of right lower lobe of lung ([place])    05/24/2018 -  Chemotherapy    The patient had pembrolizumab (KEYTRUDA) 200 mg in sodium chloride 0.9 % 50 mL chemo infusion, 200 mg, Intravenous, Once, 8 of 14 cycles Administration: 200 mg (05/24/2018), 200 mg (07/16/2018), 200 mg (08/09/2018), 200 mg (08/30/2018), 200 mg (09/20/2018), 200 mg (06/14/2018), 200 mg (10/11/2018), 200 mg (11/08/2018)  for chemotherapy treatment.      ALLERGIES:  has No Known Allergies.  MEDICATIONS:  Current Outpatient Medications  Medication Sig Dispense Refill   acetaminophen (TYLENOL) 500 MG tablet Take 500 mg by mouth 3 (three) times daily as needed.     aspirin EC 81 MG tablet Take 81 mg by mouth daily.     Calcium 600-400 MG-UNIT CHEW Chew 2 tablets by mouth daily.     fentaNYL (DURAGESIC) 50 MCG/HR Place 1 patch onto the skin every 3 (three) days. 10 patch 0   ibuprofen (ADVIL,MOTRIN) 200 MG tablet Take 200 mg by mouth every 6 (six) hours as needed.     lidocaine-prilocaine (EMLA) cream Apply to affected area  once 30 g 3   naloxone (NARCAN) nasal spray 4 mg/0.1 mL 1 spray by nose in the event of opioid overdose. Call EMS (911) immediately if medication is used. (Patient not taking: Reported on 10/13/2018) 1 kit 0   ondansetron (ZOFRAN) 8 MG tablet Take 1 tablet (8 mg total) by mouth 2 (two) times daily as needed (Nausea or vomiting). Start if needed on the third day after chemotherapy. (Patient not taking: Reported on 10/13/2018) 30 tablet 1   Oxycodone HCl 10 MG  TABS Take 1-2 tablets (10-20 mg total) by mouth every 4 (four) hours as needed. 90 tablet 0   pembrolizumab (KEYTRUDA) 100 MG/4ML SOLN Inject 2 mg/kg into the vein every 30 (thirty) days.      polyethylene glycol (MIRALAX / GLYCOLAX) packet Take 17 g by mouth daily.     senna (SENOKOT) 8.6 MG tablet Take 1 tablet by mouth as needed.      No current facility-administered medications for this visit.     VITAL SIGNS: There were no vitals taken for this visit. There were no vitals filed for this visit.  Estimated body mass index is 24.45 kg/m as calculated from the following:   Height as of 11/01/18: 5' 7" (1.702 m).   Weight as of 11/08/18: 156 lb 1.4 oz (70.8 kg).  LABS: CBC:    Component Value Date/Time   WBC 8.4 11/08/2018 1317   HGB 15.1 11/08/2018 1317   HGB 14.9 04/06/2014 0826   HCT 45.5 11/08/2018 1317   HCT 44.8 04/06/2014 0826   PLT 254 11/08/2018 1317   PLT 238 04/06/2014 0826   MCV 85.8 11/08/2018 1317   MCV 95 04/06/2014 0826   NEUTROABS 6.4 11/08/2018 1317   NEUTROABS 3.9 04/06/2014 0826   LYMPHSABS 0.9 11/08/2018 1317   LYMPHSABS 1.1 04/06/2014 0826   MONOABS 0.7 11/08/2018 1317   MONOABS 0.7 04/06/2014 0826   EOSABS 0.4 11/08/2018 1317   EOSABS 0.1 04/06/2014 0826   BASOSABS 0.1 11/08/2018 1317   BASOSABS 0.1 04/06/2014 0826   Comprehensive Metabolic Panel:    Component Value Date/Time   NA 136 11/08/2018 1317   NA 140 04/06/2014 0826   K 4.0 11/08/2018 1317   K 4.0 04/06/2014 0826   CL 104 11/08/2018 1317   CL 108 (H) 04/06/2014 0826   CO2 24 11/08/2018 1317   CO2 25 04/06/2014 0826   BUN 13 11/08/2018 1317   BUN 12 04/06/2014 0826   CREATININE 0.94 11/08/2018 1317   CREATININE 1.17 04/06/2014 0826   GLUCOSE 151 (H) 11/08/2018 1317   GLUCOSE 106 (H) 04/06/2014 0826   CALCIUM 9.0 11/08/2018 1317   CALCIUM 8.9 04/06/2014 0826   AST 22 11/08/2018 1317   AST 24 04/06/2014 0826   ALT 16 11/08/2018 1317   ALT 33 04/06/2014 0826   ALKPHOS 68  11/08/2018 1317   ALKPHOS 85 04/06/2014 0826   BILITOT 1.1 11/08/2018 1317   BILITOT 0.8 04/06/2014 0826   PROT 7.8 11/08/2018 1317   PROT 7.3 04/06/2014 0826   ALBUMIN 4.3 11/08/2018 1317   ALBUMIN 3.8 04/06/2014 0826    RADIOGRAPHIC STUDIES: Ct Chest W Contrast  Result Date: 11/02/2018 CLINICAL DATA:  Restaging metastatic lung cancer. Radiation therapy completed. Undergoing Keytruda therapy. EXAM: CT CHEST, ABDOMEN, AND PELVIS WITH CONTRAST TECHNIQUE: Multidetector CT imaging of the chest, abdomen and pelvis was performed following the standard protocol during bolus administration of intravenous contrast. CONTRAST:  155m OMNIPAQUE IOHEXOL 300 MG/ML  SOLN COMPARISON:  Chest  CT 08/26/2018.  PET-CT 04/30/2018. FINDINGS: CT CHEST FINDINGS Cardiovascular: Atherosclerosis of the aorta, great vessels and coronary arteries. Right IJ Port-A-Cath tip extends to the mid right atrium. The heart size is normal. There is no pericardial effusion. Mediastinum/Nodes: Residual prominent mediastinal and hilar lymph nodes are similar to the previous studies, including a 10 mm right hilar node on image 27/2 and 11 mm right infrahilar node on image 31/2. There is no axillary adenopathy. The thyroid gland, trachea and esophagus demonstrate no significant findings. Lungs/Pleura: Trace residual right pleural effusion is smaller and appears partially loculated. There is persistent fissural thickening on the right. There is moderate centrilobular and paraseptal emphysema. The dominant centrally necrotic mass in the superior segment of the right lower lobe measures 4.0 x 3.0 cm on image 79/4 (previously 3.8 x 2.3 cm, remeasured). A previously demonstrated nodule along the superior aspect of the right minor fissure has improved, currently not easily measurable (image 77/4). Other scattered nodules have not significantly changed, including a 14 x 9 mm right upper lobe nodule on image 42/4. Musculoskeletal/Chest wall: Vertebral  plana again noted at T4 with mild osseous retropulsion. No new osseous metastases or fractures identified. CT ABDOMEN AND PELVIS FINDINGS Hepatobiliary: Scattered low-density hepatic lesions are stable and likely cysts based on stability. There are no worrisome hepatic findings. No evidence of gallstones, gallbladder wall thickening or biliary dilatation. Pancreas: Unremarkable. No pancreatic ductal dilatation or surrounding inflammatory changes. Spleen: Normal in size without focal abnormality. Adrenals/Urinary Tract: Stable 1.7 x 1.1 cm left adrenal adenoma on image 57/2. The right adrenal gland appears normal. There are small bilateral renal cysts. The small lesion in the upper pole of the right kidney noted on the prior chest CT is smaller, measuring 9 mm on image 11/3, likely a resolving complex cyst. No evidence of urinary tract calculus or hydronephrosis. The bladder appears normal. Stomach/Bowel: No evidence of bowel wall thickening, distention or surrounding inflammatory change. Vascular/Lymphatic: There are no enlarged abdominal or pelvic lymph nodes. Mild aortic and branch vessel atherosclerosis. No acute vascular findings. Reproductive: The prostate gland and seminal vesicles appear unremarkable. Other: There is no ascites or peritoneal nodularity. Intact anterior abdominal wall. Musculoskeletal: No acute or significant osseous findings. IMPRESSION: 1. The dominant right lower lobe mass has slightly enlarged compared with the most recent CT of 2 months ago, although demonstrates central necrosis. Current size is more similar to that demonstrated on the PET-CT. 2. Additional smaller pulmonary nodules are stable to slightly improved. No definite new nodules. Small right hilar and mediastinal lymph nodes are stable. 3. Persistent pleural thickening and small loculated right pleural effusion. 4. Stable pathologic fracture at T4. No new osseous metastases or fractures identified. 5. No evidence of metastatic  disease in the abdomen or pelvis. 6. Aortic Atherosclerosis (ICD10-I70.0) and Emphysema (ICD10-J43.9). Electronically Signed   By: Richardean Sale M.D.   On: 11/02/2018 11:22   Ct Abdomen Pelvis W Contrast  Result Date: 11/02/2018 CLINICAL DATA:  Restaging metastatic lung cancer. Radiation therapy completed. Undergoing Keytruda therapy. EXAM: CT CHEST, ABDOMEN, AND PELVIS WITH CONTRAST TECHNIQUE: Multidetector CT imaging of the chest, abdomen and pelvis was performed following the standard protocol during bolus administration of intravenous contrast. CONTRAST:  181m OMNIPAQUE IOHEXOL 300 MG/ML  SOLN COMPARISON:  Chest CT 08/26/2018.  PET-CT 04/30/2018. FINDINGS: CT CHEST FINDINGS Cardiovascular: Atherosclerosis of the aorta, great vessels and coronary arteries. Right IJ Port-A-Cath tip extends to the mid right atrium. The heart size is normal. There is no pericardial  effusion. Mediastinum/Nodes: Residual prominent mediastinal and hilar lymph nodes are similar to the previous studies, including a 10 mm right hilar node on image 27/2 and 11 mm right infrahilar node on image 31/2. There is no axillary adenopathy. The thyroid gland, trachea and esophagus demonstrate no significant findings. Lungs/Pleura: Trace residual right pleural effusion is smaller and appears partially loculated. There is persistent fissural thickening on the right. There is moderate centrilobular and paraseptal emphysema. The dominant centrally necrotic mass in the superior segment of the right lower lobe measures 4.0 x 3.0 cm on image 79/4 (previously 3.8 x 2.3 cm, remeasured). A previously demonstrated nodule along the superior aspect of the right minor fissure has improved, currently not easily measurable (image 77/4). Other scattered nodules have not significantly changed, including a 14 x 9 mm right upper lobe nodule on image 42/4. Musculoskeletal/Chest wall: Vertebral plana again noted at T4 with mild osseous retropulsion. No new  osseous metastases or fractures identified. CT ABDOMEN AND PELVIS FINDINGS Hepatobiliary: Scattered low-density hepatic lesions are stable and likely cysts based on stability. There are no worrisome hepatic findings. No evidence of gallstones, gallbladder wall thickening or biliary dilatation. Pancreas: Unremarkable. No pancreatic ductal dilatation or surrounding inflammatory changes. Spleen: Normal in size without focal abnormality. Adrenals/Urinary Tract: Stable 1.7 x 1.1 cm left adrenal adenoma on image 57/2. The right adrenal gland appears normal. There are small bilateral renal cysts. The small lesion in the upper pole of the right kidney noted on the prior chest CT is smaller, measuring 9 mm on image 11/3, likely a resolving complex cyst. No evidence of urinary tract calculus or hydronephrosis. The bladder appears normal. Stomach/Bowel: No evidence of bowel wall thickening, distention or surrounding inflammatory change. Vascular/Lymphatic: There are no enlarged abdominal or pelvic lymph nodes. Mild aortic and branch vessel atherosclerosis. No acute vascular findings. Reproductive: The prostate gland and seminal vesicles appear unremarkable. Other: There is no ascites or peritoneal nodularity. Intact anterior abdominal wall. Musculoskeletal: No acute or significant osseous findings. IMPRESSION: 1. The dominant right lower lobe mass has slightly enlarged compared with the most recent CT of 2 months ago, although demonstrates central necrosis. Current size is more similar to that demonstrated on the PET-CT. 2. Additional smaller pulmonary nodules are stable to slightly improved. No definite new nodules. Small right hilar and mediastinal lymph nodes are stable. 3. Persistent pleural thickening and small loculated right pleural effusion. 4. Stable pathologic fracture at T4. No new osseous metastases or fractures identified. 5. No evidence of metastatic disease in the abdomen or pelvis. 6. Aortic Atherosclerosis  (ICD10-I70.0) and Emphysema (ICD10-J43.9). Electronically Signed   By: Richardean Sale M.D.   On: 11/02/2018 11:22    PERFORMANCE STATUS (ECOG) : 1 - Symptomatic but completely ambulatory  Review of Systems As noted above. Otherwise, a complete review of systems is negative.  Physical Exam General: NAD, frail appearing, thin Cardiovascular: regular rate and rhythm Pulmonary: clear ant fields Abdomen: soft, nontender, + bowel sounds GU: no suprapubic tenderness Extremities: no edema Skin: no rashes Neurological: Weakness but otherwise nonfocal  IMPRESSION: Follow-up visit today in the clinic for pain.  Patient is unaccompanied.  Patient reports doing reasonably well.  Does complain of more constant back pain although the intensity is still rated 3 out of 10.  States previously pain was worse after working d/t constant bending motion.  Unfortunately, he has been out of work for approximately 6 weeks due to COVID-19.  He continues fentanyl 50 mcg every 72 hours and has  been taking oxycodone 20 mg first thing in the morning and 10 mg approximately 3 more times throughout the day.  Reports a continued average 3 out of 10 pain.  Constant.  Was recently evaluated at [facility] by the spine surgeon.  Had follow-up CT scan on 10/29/18 which demonstrated stable fracture and stable retropulsion.  They discussed treatment options that included observation and/or surgical intervention.  Patient initially declined surgery.  Plan is to reevaluate with imaging in approximately 3 months unless pain worsened.   Recently had restaging imaging CT A/P/C.  Mixed response.  Right lower lobe mass with slight enlargement and additional smaller pulmonary nodules were stable to slightly improved.  No definite new nodules.  Lymph nodes stable.  No evidence of metastatic disease in the abdomen or pelvis.  Plan is to continue [place].  He expresses some concern with his finances.  He is unsure of when he will be allowed to  go back to work if at all given his condition.  States he was able to suspend his mortgage for several months given COVID-19.  This is helped some with his finances but he is still having to cut back.  He also expresses some concern with worry and stress over diagnosis.  Since he has been out of work he has had few distractions and has been thinking a lot about his diagnosis/prognosis.  He is trying to keep himself busy. He denies depression.   ACP documents previously reviewed and patient completing at his convenience.  PLAN: 1. Continue oxycodone 10-67m Q4-6H PRN for now. Refilled today.  2. Increase transdermal fentanyl from 528m Q72H to 75 mcg Q 72H. RX 25 MCG every 72 hours to add to 50 MCG. New patch placed today. 3.  Continue prophylactic bowel regimen 4.  Patient reviewing ACP documents at home 5.  RTC on Friday to reevaluate pain and make further pain medication adjustments if needed. 6.  Reach out to [employer] see if financial assistance is available. [facility] after speaking to [facility] Patient expressed understanding and was in agreement with this plan. He also understands that He can call clinic at any time with any questions, concerns, or complaints.   Time Total: 25 minutes  Visit consisted of counseling and education dealing with the complex and emotionally intense issues of symptom management and palliative care in the setting of serious and potentially life-threatening illness.Greater than 50%  of this time was spent counseling and coordinating care related to the above assessment and plan.  Signed by: JeFaythe CasaNP 11/15/2018 1:52 PM

## 2018-11-17 ENCOUNTER — Telehealth: Payer: Self-pay | Admitting: Hospice and Palliative Medicine

## 2018-11-18 ENCOUNTER — Encounter: Payer: Self-pay | Admitting: Hospice and Palliative Medicine

## 2018-11-18 ENCOUNTER — Inpatient Hospital Stay: Payer: Medicare Other | Attending: Hospice and Palliative Medicine | Admitting: Hospice and Palliative Medicine

## 2018-11-18 ENCOUNTER — Other Ambulatory Visit: Payer: Self-pay

## 2018-11-18 DIAGNOSIS — Z515 Encounter for palliative care: Secondary | ICD-10-CM

## 2018-11-18 DIAGNOSIS — C7951 Secondary malignant neoplasm of bone: Secondary | ICD-10-CM

## 2018-11-18 DIAGNOSIS — Z923 Personal history of irradiation: Secondary | ICD-10-CM

## 2018-11-18 DIAGNOSIS — C7801 Secondary malignant neoplasm of right lung: Secondary | ICD-10-CM | POA: Diagnosis not present

## 2018-11-18 DIAGNOSIS — C7972 Secondary malignant neoplasm of left adrenal gland: Secondary | ICD-10-CM

## 2018-11-18 DIAGNOSIS — G893 Neoplasm related pain (acute) (chronic): Secondary | ICD-10-CM | POA: Diagnosis not present

## 2018-11-18 DIAGNOSIS — C3431 Malignant neoplasm of lower lobe, right bronchus or lung: Secondary | ICD-10-CM | POA: Diagnosis not present

## 2018-11-18 NOTE — Progress Notes (Signed)
Virtual Visit via Telephone Note  I connected with Jimmy West on 11/18/18 at 10:30 AM EDT by telephone and verified that I am speaking with the correct person using two identifiers.   I discussed the limitations, risks, security and privacy concerns of performing an evaluation and management service by telephone and the availability of in person appointments. I also discussed with the patient that there may be a patient responsible charge related to this service. The patient expressed understanding and agreed to proceed.   History of Present Illness: Palliative care follow-up for this 68 year old male with multiple medical problems including presumed metastatic lung cancer.  Initially, patient presented with pulmonary nodules from chest x-ray ordered by PCP.  Subsequently referred to oncology for work-up and was evaluated by Dr. Mike Gip on 04/30/2018.  PET scan revealed several hypermetabolic pulmonary nodules and activity within the T4 vertebral body.  Follow-up MRI of the spine revealed T4 metastasis with pathological compression deformity and mild spinal canal stenosis with probable edema and extraosseous extension of neoplasm from mid T3 to mid T4.  He was referred to palliative care clinic to assist with symptom management and to establish treatment goals.    Observations/Objective: Patient reports that pain has been relatively stable since fentanyl dose was increased to 75 mcg every 72 hours.  He denies any other changes or concerns during phone call today.  Patient was prescribed three 25 mcg patches to use with 50 mcg patches and will need 75 mcg patches prescribed at time of refill.  Patient continues to have a significant financial toll as he is unable to work due to the COVID-19 pandemic.  He has been referred to [place], SW here at the cancer center.  We will also refer to community-based palliative care for social work to discuss resources.  Assessment and Plan: -Continue  oxycodone 10-20mg  Q4-6H PRN -Continue fentanyl 75mcg Q72H -Continue prophylactic bowel regimen -Referral for SW with community palliative care to discuss resources -RTC in 1 month    I discussed the assessment and treatment plan with the patient. The patient was provided an opportunity to ask questions and all were answered. The patient agreed with the plan and demonstrated an understanding of the instructions.   The patient was advised to call back or seek an in-person evaluation if the symptoms worsen or if the condition fails to improve as anticipated.  I provided 10 minutes of non-face-to-face time during this encounter.   Irean Hong, NP

## 2018-11-19 ENCOUNTER — Telehealth: Payer: Self-pay

## 2018-11-19 ENCOUNTER — Telehealth: Payer: Self-pay | Admitting: Hospice and Palliative Medicine

## 2018-11-19 NOTE — Telephone Encounter (Signed)
SW attempted to contact patient to schedule initial palliative care visit. SW left VM with contact information.

## 2018-11-22 ENCOUNTER — Inpatient Hospital Stay: Payer: Medicare Other | Attending: Hematology and Oncology

## 2018-11-22 ENCOUNTER — Other Ambulatory Visit: Payer: Self-pay

## 2018-11-22 NOTE — Progress Notes (Signed)
Nutrition Follow-up:  RD working remotely.  Patient with adenocarcinoma of the lung.  Patient receiving pembrolizmab.  Patient followed by Dr. Mike Gip.    Called patient for nutrition follow-up.  Patient reports appetite is about the same.  Reports that he continues to try to add extra calories on food (ie adding cheese and mayo to sandwiches, bought cheese dip for chips).  Reports that he is drinking about 1 ensure enlive shake daily.   Currently is not working due to COVID-19 virus.  Reports pain is better and controlled with current pain regimen.  Reports that he does not have issues with constipation.  No nausea/vomiting reported at this time.    Medications: reviewed  Labs: reviewed  Anthropometrics:   Weight noted on 4/20 156 lb slight decrease from 159 on 3/23.  Overall increased from early March and Feb.    NUTRITION DIAGNOSIS:  Unintentional weight loss improving    INTERVENTION:  Patient to continue to use strategies to increase calories and protein 2nd case of ensure enlive with coupons placed at registration desk [facility] office) for pick up this week for patient.  Patient informed.  Encouraged patient to increase ensure enlive to 2 per day. Patient has contact information    MONITORING, EVALUATION, GOAL: weight trends, intake   NEXT VISIT: phone f/u on July 13th, patient informed  Najat Olazabal B. Zenia Resides, [place], [place] Registered Dietitian [phone] (pager)

## 2018-11-25 ENCOUNTER — Other Ambulatory Visit: Payer: Self-pay

## 2018-11-25 ENCOUNTER — Other Ambulatory Visit: Payer: Medicare Other

## 2018-11-25 DIAGNOSIS — Z515 Encounter for palliative care: Secondary | ICD-10-CM

## 2018-11-25 NOTE — Progress Notes (Signed)
PATIENT NAME: Jimmy West DOB: Jul 30, 1950 MRN: [id number]  PRIMARY CARE PROVIDER: [name], NP  RESPONSIBLE PARTY:  Acct ID - Guarantor Home Phone Work Phone Relationship Acct Type  [id number] DJON, [phone]  Self P/F     [address], [place], [place] [address]    PLAN OF CARE and INTERVENTIONS:               1.  GOALS OF CARE/ ADVANCE CARE PLANNING:  Remain independent with symptoms managed.               2.  PATIENT/CAREGIVER EDUCATION:  Education on s/s of infection, cough, fever, chills, shortness of breath, fatigue               4. PERSONAL EMERGENCY PLAN:  Patient lives alone and co-worker Jimmy West checks on patient frequently.  Patient has 1 brother who lives on [place].                 5.  DISEASE STATUS:Patient is a 68 year old patient of Dr. Mike Gip admitted to palliative care services with diagnosis of lung cancer with bone mets.  Patient agreeable to telephonic visit due to patient not being able to connect to Zoom for telehealth visit. Due to the COVID-19 crisis, this visit was done via telephonic from my office and it was initiated and consent by this patient and or family. Palliative Care services explained and patient agreeable to services.Patient reports Jimmy West at the cancer center informed him of in home palliative care services.  Patient reports he was diagnosed in October 2019.  Patient reports he was having pain in his back.  Patient reports he is currently receiving immunotherapy.  Patient reports when immunotherapy no longer works chemo will be added.  Patient reports he received 10 radiation treatments to shrink tumor in his back.  Patient currently on Fentanyl 75 mcg patch and takes Oxycodone for BTP.  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  Patient reports having shortness of breath on exertion and denies  having any cough.  Patient is not currently using oxygen. Patient reports his appetite is not good but he reports he has never been a "big eater."  Patient reports co worker Jimmy West has been making him shakes with nutritional supplements.  Patient reports his normal weight was between 165-170.  Patient reports he has been sleeping well a night.  Patient remains independent with ambulation and is able to take himself to MD appointments.  Patient to receive next immunotherapy treatment on Monday.  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  Patient in agreement with Palliative care services.  Patient encouraged to call with questions or concerns.                       HISTORY OF PRESENT ILLNESS:    CODE STATUS: Full Code  ADVANCED DIRECTIVES:No MOST FORM: No PPS: 70%   PHYSICAL EXAM:   VITALS:VS not taken telephonic visit  LUNGS: shortness of breath on exertion, denies cough CARDIAC:  EXTREMITIES:   SKIN: denies having any open areas of skin breakdown, does have port-a-cath  NEURO: Alert and oriented x 4       Nilda Simmer, RN

## 2018-11-25 NOTE — Progress Notes (Signed)
COMMUNITY PALLIATIVE CARE SW NOTE  PATIENT NAME: Jimmy West DOB: October 09, 1950 MRN: [id number]  PRIMARY CARE PROVIDER: [name], NP  RESPONSIBLE PARTY:  Acct ID - Guarantor Home Phone Work Phone Relationship Acct Type  [id number] DAXX, [phone]  Self P/F     [address], [place], [place] [address]     PLAN OF CARE and INTERVENTIONS:             1. GOALS OF CARE/ ADVANCE CARE PLANNING:  Patient is a full code. Patient has advance care planning documents and is planning to review. Encouraged to reach out to SW with any questions/concerns.  2. SOCIAL/EMOTIONAL/SPIRITUAL ASSESSMENT/ INTERVENTIONS:  SW and RN attempted to meet with patient via Zoom but patient was unable to connect, completed visit via telephone. Patient provided brief medical history, patient was diagnosed with cancer in October 2019. Patient reports doing "okay", noted back pain. Patient is currently receiving immunotherapy, and explained that when the immunotherapy is no longer effective, then he will do chemotherapy. Patient does have SOB, but no cough. Patient has a close friend and co-worker [name] that helps when needed. Patient noted limited family support, one cousin is local and his brother lives in [place]. 3. PATIENT/CAREGIVER EDUCATION/ COPING:  Patient coping well, was cheerful during visit.  4. PERSONAL EMERGENCY PLAN:  Patient will call 9-1-1 for emergencies. Due to COVID-19 crisis, patient verbalized that he is remaining at home and avoiding close contact with others..  5. COMMUNITY RESOURCES COORDINATION/ HEALTH CARE NAVIGATION:  None at this time. Patient managing his care with cancer center.  6. FINANCIAL/LEGAL CONCERNS/INTERVENTIONS:  Patient mentioned he receives social security but was working, as able, as a Haematologist. Patient mentioned concerns about physically being able to work after COVID-19 crisis. SW continue to monitor for any needs.     SOCIAL HX:  Social  History   Tobacco Use  . Smoking status: Former Smoker    Packs/day: 1.50    Years: 15.00    Pack years: 22.50    Types: Cigarettes    Last attempt to quit: 11/03/2003    Years since quitting: 15.0  . Smokeless tobacco: Never Used  Substance Use Topics  . Alcohol use: Yes    Alcohol/week: 15.0 standard drinks    Types: 15 Shots of liquor per week    Comment: occasionally    CODE STATUS:   Code Status: Not on file  ADVANCED DIRECTIVES: N MOST FORM COMPLETE:  No HOSPICE EDUCATION PROVIDED: None.  PPS: Patient is independent of ADLs.   Due to the COVID-19 crisis, this visit was done via telephone from my office and it was initiated and consent by this patient and/or family. This was a scheduled visit.   I spent 45 minutes with patient/family, from 11:00-11:45a providing education, support and consultation.     Margaretmary Lombard, LCSW

## 2018-11-26 ENCOUNTER — Other Ambulatory Visit: Payer: Self-pay | Admitting: *Deleted

## 2018-11-26 MED ORDER — FENTANYL 75 MCG/HR TD PT72: 1.0000 | MEDICATED_PATCH | TRANSDERMAL | 0 refills | Status: DC

## 2018-11-26 MED ORDER — OXYCODONE HCL 10 MG PO TABS: 10.0000 mg | ORAL_TABLET | ORAL | 0 refills | Status: DC | PRN

## 2018-11-26 NOTE — Telephone Encounter (Signed)
Pt requesting refill of fentanyl 75 and oxycodone to be sent to [facility].

## 2018-11-28 ENCOUNTER — Other Ambulatory Visit: Payer: Self-pay

## 2018-11-28 NOTE — Progress Notes (Signed)
[facility]  [address] [place], [place] [address] Phone: [phone]  Fax: [phone]   Clinic Day:  11/29/2018  Referring physician: [name], NP  Chief Complaint: Jimmy West is a 68 y.o. male with with metastatic adenocarcinoma of the lung who is seen for assessment prior to cycle #9 pembrolizumab.  HPI: The patient was last seen in the medical oncology clinic on 11/15/2018 by Faythe Casa, NP. At that time, he was doing reasonably well.  He reported constant back pain rated at 3/10.  Exam was stable. He continued with 10-20 mg oxycodone every 4-6 hours and increased transdermal Fentanyl to 75 mcg/hr every 72 hours. He was under significant financial strain as he was not working because of the COVID-19 pandemic.   On 11/18/2018 he spoke with [name], NP for pain management.  Medication was kept at the same dosage.  On 11/22/2018 he met with Jennet Maduro, RD. He increased his intake of Ensure to 2x daily.   During the interim, he has felt "pretty good".  He feels pain is better controlled.  He is currently on Fentanyl 75 mcg/hr and oxycodone 4-5 pills/day.  He was taking 5-6 oxycodone per day.  He notes sometimes a "numb" sensation in his mid back in the morning when he leans over the sink.  Symptoms are transient.  He denies any numbness or weakness in his legs.  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  He is uncertain if he will return to work.  He notes that his work may open up during "phase II".   Past Medical History:  Diagnosis Date   Bulging lumbar disc    Cancer ([place])    Heart attack ([place])     Past Surgical History:  Procedure Laterality Date   CARDIAC CATHETERIZATION     two stents   KNEE SURGERY Left    PORTA CATH INSERTION N/A 05/21/2018   Procedure: PORTA CATH INSERTION;  Surgeon: Algernon Huxley, MD;  Location: [facility];  Service: Cardiovascular;  Laterality: N/A;    Family  History  Problem Relation Age of Onset   Heart failure Father    Cancer Maternal Aunt    Heart failure Maternal Uncle    Dementia Paternal Grandmother    Cancer Maternal Aunt     Social History:  reports that he quit smoking about 15 years ago. His smoking use included cigarettes. He has a 22.50 pack-year smoking history. He has never used smokeless tobacco. He reports current alcohol use of about 15.0 standard drinks of alcohol per week. He reports that he does not use drugs. He started smoking at age 43.   He is smoking 1 1/2 packs/day.  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 Patient denies known exposures to radiation on toxins. The patient is alone today.  Allergies: No Known Allergies  Current Medications: Current Outpatient Medications  Medication Sig Dispense Refill   aspirin EC 81 MG tablet Take 81 mg by mouth daily.     Calcium 600-400 MG-UNIT CHEW Chew 2 tablets by mouth daily.     fentaNYL (DURAGESIC) 75 MCG/HR Place 1 patch onto the skin every 3 (three) days. 10 patch 0   Oxycodone HCl 10 MG TABS Take 1-2 tablets (10-20 mg total) by mouth every 4 (four) hours as needed. 90 tablet 0   pembrolizumab (KEYTRUDA) 100 MG/4ML SOLN  Inject 2 mg/kg into the vein every 30 (thirty) days.      polyethylene glycol (MIRALAX / GLYCOLAX) packet Take 17 g by mouth daily.     senna (SENOKOT) 8.6 MG tablet Take 1 tablet by mouth as needed.      acetaminophen (TYLENOL) 500 MG tablet Take 500 mg by mouth 3 (three) times daily as needed.     ibuprofen (ADVIL,MOTRIN) 200 MG tablet Take 200 mg by mouth every 6 (six) hours as needed.     lidocaine-prilocaine (EMLA) cream Apply to affected area once (Patient not taking: Reported on 11/18/2018) 30 g 3   naloxone (NARCAN) nasal spray 4 mg/0.1 mL 1 spray by nose in the event of opioid overdose. Call EMS (911) immediately if medication is used. (Patient not taking: Reported on  10/13/2018) 1 kit 0   ondansetron (ZOFRAN) 8 MG tablet Take 1 tablet (8 mg total) by mouth 2 (two) times daily as needed (Nausea or vomiting). Start if needed on the third day after chemotherapy. (Patient not taking: Reported on 10/13/2018) 30 tablet 1   No current facility-administered medications for this visit.    Facility-Administered Medications Ordered in Other Visits  Medication Dose Route Frequency Provider Last Rate Last Dose   heparin lock flush 100 unit/mL  500 Units Intravenous Once Naraya Stoneberg C, MD       sodium chloride flush (NS) 0.9 % injection 10 mL  10 mL Intravenous PRN Lequita Asal, MD   10 mL at 11/29/18 7564    Review of Systems  Constitutional: Negative.  Negative for chills, diaphoresis, fever, malaise/fatigue and weight loss (up 2 pounds).       Feels "pretty good".  HENT: Negative.  Negative for congestion, ear pain, nosebleeds, sinus pain and sore throat.   Eyes: Negative.  Negative for blurred vision, double vision, photophobia and pain.  Respiratory: Negative.  Negative for cough, hemoptysis, sputum production, shortness of breath and wheezing.   Cardiovascular: Negative.  Negative for chest pain, palpitations, orthopnea, leg swelling and PND.  Gastrointestinal: Negative.  Negative for abdominal pain, blood in stool, constipation, diarrhea, heartburn, melena, nausea and vomiting.  Genitourinary: Negative.  Negative for dysuria, frequency, hematuria and urgency.  Musculoskeletal: Positive for back pain (well controlled). Negative for falls, joint pain, myalgias and neck pain.  Skin: Negative.  Negative for itching and rash.  Neurological: Positive for sensory change (intermittent numbness in mid back (see HPI); chronic numbness to LEFT heel 2/2 spinal disc issues). Negative for dizziness, tremors, speech change, focal weakness, weakness and headaches.  Endo/Heme/Allergies: Negative.  Does not bruise/bleed easily.  Psychiatric/Behavioral: Negative.   Negative for depression and memory loss. The patient is not nervous/anxious and does not have insomnia.        Notes being "not motivated".  All other systems reviewed and are negative.  Performance status (ECOG): 1  Physical Exam  Constitutional: He is oriented to person, place, and time. No distress.  Thin gentleman sitting comfortably in the exam room in no acute distress.  HENT:  Head: Normocephalic and atraumatic.  Mouth/Throat: Oropharynx is clear and moist. He has dentures. No oropharyngeal exudate.  Brown hair with slight graying.  Mustache.  Dentures.  Wearing a mask.    Eyes: Pupils are equal, round, and reactive to light. Conjunctivae and EOM are normal. No scleral icterus.  Gray blue eyes.  Neck: Normal range of motion. Neck supple. No JVD present.  Cardiovascular: Normal rate, regular rhythm and normal heart sounds. Exam reveals no  gallop and no friction rub.  No murmur heard. Pulmonary/Chest: Effort normal and breath sounds normal. No respiratory distress. He has no wheezes. He has no rales. He exhibits no tenderness.  Abdominal: Soft. Bowel sounds are normal. He exhibits no distension and no mass. There is no abdominal tenderness. There is no rebound and no guarding.  Musculoskeletal: Normal range of motion.        General: No edema.     Thoracic back: He exhibits no tenderness (left T4 paravertebral muscle slightly uncomfortable to palpation).  Lymphadenopathy:    He has no cervical adenopathy.    He has no axillary adenopathy.       Right: No supraclavicular adenopathy present.       Left: No supraclavicular adenopathy present.  Neurological: He is alert and oriented to person, place, and time.  Skin: Skin is warm and dry. No rash noted. He is not diaphoretic. No erythema.  Psychiatric: He has a normal mood and affect. His behavior is normal. Judgment and thought content normal.  Nursing note reviewed.   Imaging studies: 04/27/2018:  Chest CT angiogram revealed no  evidence of significant pulmonary embolus.  There was a 4.1 cm mass in the superior segment right lower lobe posteriorly causing obliteration of the in coming bronchus and mild postobstructive change. There were multiple additional bilateral pulmonary nodules (< 1 cm - 1.4 cm).   There were enlarged right hilar lymph nodes.  There was metastasis to the T4 vertebra with mild associated compression.  There was emphysematous changes in the lungs. 04/30/2018:  PET scan revealed a 3.4 cm hypermetabolic RLL pulmonary mass (SUV 13.2), 11 mm pulmonary nodule in the LEFT lung (SUV 4.5), RIGHT paratracheal lymph node (SUV 5.1), and hypermetabolic activity within the T4 vertebral body (SUV 10.2).  05/03/2018:  Thoracic spine MRI revealed T4 metastasis with a 40% pathologic compression deformity and 5 mm of retropulsion of the vertebral body.  Retropulsion results in mild spinal canal stenosis and mild bilateral C4-5 foraminal stenosis.  There was paravertebral soft tissue thickening from mid T3 to mid T5, likely representing edema related to the pathologic compression deformity vs. possible extraosseous extension of the neoplasm.  There were no additional thoracic spinal metastases noted.  05/03/2018:  Head MRI revealed no intracranial metastatic disease.  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  Incidental mention made of mild paranasal sinus disease. 08/17/2018:  Thoracic spine CT at [facility] on 08/17/2018 revealed pathologic compression fracture of T4 resulting in vertebra plana. Fracture fragments protrude into the spinal canal resulting in moderate to severe canal narrowing at T4.  There was severe right and moderate left foraminal narrowing at T4-T5 secondary to vertebra plana.  There were multiple spiculated pulmonary masses consistent with  metastatic adenocarcinoma, similar in appearance to 04/30/2018 PET/CT.   08/26/2018:  Chest CT revealed a 3.1 cm right lower lobe mass, corresponding to known primary bronchogenic neoplasm, mildly decreased.  Bilateral pulmonary nodules/metastases, most of which are improved.  There was mildly progressive pleural-based nodularity in the right upper lobe along the minor fissure, including a dominant 17 x 11 mm nodule.  There was a small right pleural effusion, decreased.  There was a 15 mm left adrenal metastasis, decreased.   10/29/2018:  Thoracic spine CT at [facility] revealed unchanged severe compression deformity/vertebral vertebra plana of T4.  There was increased sclerotic  changes within the collapsed vertebral body possibly due to interval radiation.  There was a stable degree of retropulsion and focal mild spinal canal stenosis at T4.  There was interval enlargement of the left and right upper lobe spiculated pulmonary nodules and right lower lobe mass.  11/02/2018:  Chest, abdomen, and pelvic CT revealed the dominant right lower lobe mass has slightly enlarged compared with the most recent CT of 2 months ago, although demonstrated central necrosis.  Current size was more similar to that demonstrated on the PET-CT.  Additional smaller pulmonary nodules were stable to slightly improved. No definite new nodules. Small right hilar and mediastinal lymph nodes were stable.  There was persistent pleural thickening and small loculated right pleural effusion.  There was stable pathologic fracture at T4. No new osseous metastases or fractures identified.  There was no evidence of metastatic disease in the abdomen or pelvis.  Images were reviewed with Dr Kathlene Cote.  Lung lesions were smaller.  There was central necrosis in the RLL lesion.  There was no evidence of progressive disease.  Appointment on 11/29/2018  Component Date Value Ref Range Status   LDH 11/29/2018 165  98 - 192 U/L Final   Performed at [facility], [address] [place], [place] [address]   Sodium 11/29/2018 138  135 - 145 mmol/L Final   Potassium 11/29/2018 3.8  3.5 - 5.1 mmol/L Final   Chloride 11/29/2018 105  98 - 111 mmol/L Final   CO2 11/29/2018 26  22 - 32 mmol/L Final   Glucose, Bld 11/29/2018 119* 70 - 99 mg/dL Final   BUN 11/29/2018 9  8 - 23 mg/dL Final   Creatinine, Ser 11/29/2018 0.92  0.61 - 1.24 mg/dL Final   Calcium 11/29/2018 9.2  8.9 - 10.3 mg/dL Final   Total Protein 11/29/2018 7.0  6.5 - 8.1 g/dL Final   Albumin 11/29/2018 4.0  3.5 - 5.0 g/dL Final   AST 11/29/2018 23  15 - 41 U/L Final   ALT 11/29/2018 19  0 - 44 U/L Final   Alkaline Phosphatase 11/29/2018 66  38 - 126 U/L Final   Total Bilirubin 11/29/2018 0.6  0.3 - 1.2 mg/dL Final   GFR calc non Af Amer 11/29/2018 >60  >60 mL/min Final   GFR calc Af Amer 11/29/2018 >60  >60 mL/min Final   Anion gap 11/29/2018 7  5 - 15 Final   Performed at [facility], [address] [place], [place] [address]   WBC 11/29/2018 7.9  4.0 - 10.5 K/uL Final   RBC 11/29/2018 4.81  4.22 - 5.81 MIL/uL Final   Hemoglobin 11/29/2018 14.0  13.0 - 17.0 g/dL Final   HCT 11/29/2018 41.6  39.0 - 52.0 % Final   MCV 11/29/2018 86.5  80.0 - 100.0 fL Final   MCH 11/29/2018 29.1  26.0 - 34.0 pg Final   MCHC 11/29/2018 33.7  30.0 - 36.0 g/dL Final   RDW 11/29/2018 15.5  11.5 - 15.5 % Final   Platelets 11/29/2018 211  150 - 400 K/uL Final   nRBC 11/29/2018 0.0  0.0 - 0.2 % Final   Neutrophils Relative % 11/29/2018 72  % Final   Neutro Abs 11/29/2018 5.8  1.7 - 7.7 K/uL Final   Lymphocytes Relative 11/29/2018 13  % Final   Lymphs Abs 11/29/2018 1.0  0.7 - 4.0 K/uL Final   Monocytes Relative 11/29/2018 8  % Final   Monocytes Absolute 11/29/2018 0.6  0.1 -  1.0 K/uL Final   Eosinophils Relative 11/29/2018 5  % Final   Eosinophils Absolute 11/29/2018 0.4  0.0 - 0.5 K/uL Final   Basophils Relative 11/29/2018 1  % Final    Basophils Absolute 11/29/2018 0.1  0.0 - 0.1 K/uL Final   Immature Granulocytes 11/29/2018 1  % Final   Abs Immature Granulocytes 11/29/2018 0.04  0.00 - 0.07 K/uL Final   Performed at [facility], [address] [place], [place] [address]    Assessment:  Jimmy West is a 68 y.o. 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  Pathology revealed an invasive high-grade adenocarcinoma, with predominantly solid growth pattern.  The neoplastic cells were TTF-1 (+), Napsin A (+), and P40 (+).  He has a T4 vertebral metastasis.  Clinical stage is T4N1M1.  There was not enough material for [facility] One testing.  PDL-1 revealed TPS 90%.  PET scan on 04/30/2018 revealed a 3.4 cm hypermetabolic RLL pulmonary mass (SUV 13.2), 11 mm pulmonary nodule in the LEFT lung (SUV 4.5), RIGHT paratracheal lymph node (SUV 5.1), and hypermetabolic activity within the T4 vertebral body (SUV 10.2).   Thoracic spine MRI on 05/03/2018 revealed T4 metastasis with a 40% pathologic compression deformity and 5 mm of retropulsion of the vertebral body.  Retropulsion results in mild spinal canal stenosis and mild bilateral C4-5 foraminal stenosis.  There was paravertebral soft tissue thickening from mid T3 to mid T5, likely representing edema related to the pathologic compression deformity vs. possible extraosseous extension of the neoplasm.  There were no additional thoracic spinal metastases noted.   Head MRI on 05/03/2018 revealed no intracranial metastatic disease.  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  Incidental mention made of mild paranasal sinus disease.  He is s/p cycle #8 pembrolizumab (05/24/2018 - 11/08/2018).  He is tolerating treatment  well.  CEA was 1.3 on 08/30/2018.  LDH was 165 on 11/29/2018.  He completed T4 radiation on 07/07/2018.  He receives Xgeva monthly (06/03/2018 - 10/18/2018).  He has cancer-related pain in T4.  He is currently taking Fentanyl 75 mcg/hr and oxycodone 10 mg every 4 hours prn   Thoracic spine CT at [facility] on 10/29/2018 revealed unchanged severe compression deformity/vertebral vertebra plana of T4.  There was increased sclerotic changes within the collapsed vertebral body possibly due to interval radiation.  There was a stable degree of retropulsion and focal mild spinal canal stenosis at T4.  There was interval enlargement of the left and right upper lobe spiculated pulmonary nodules and right lower lobe mass.   Chest, abdomen, and pelvic CT on 11/02/2018 revealed the dominant right lower lobe mass has slightly enlarged compared with the most recent CT of 2 months ago, although demonstrated central necrosis.  Current size was more similar to that demonstrated on the PET-CT.  Additional smaller pulmonary nodules were stable to slightly improved. No definite new nodules. Small right hilar and mediastinal lymph nodes were stable.  There was persistent pleural thickening and small loculated right pleural effusion.  There was stable pathologic fracture at T4. No new osseous metastases or fractures identified.  There was no evidence of metastatic disease in the abdomen or pelvis.  Images were reviewed with Dr Kathlene Cote.  Lesions were smaller.  There was central necrosis in the  RLL lesion.  There was no evidence of progressive disease.  Symptomatically, he is doing well.  Pain has improved with increase of his pain medications.  He has intermittent numbness in his mid back.  Exam reveals mild T4 paravertebral muscle tenderness to palpation.    Plan: 1. Labs today:  CBC with diff, CMP, LDH. 2. Metastatic high-grade adenocarcinoma the RIGHT lung Clinically, he continues to do well. CT scans on 11/02/2018  revealed no evidence of progression.  Lesions are smaller; central necrosis in the RLL lesion. Cycle # 9 pembrolizumab today. Discuss symptom management.  He has antiemetics and pain medications at home to use on a prn bases.  Interventions are adequate.    3. T4 osseous metastasis Patient followed at [facility]. Surgery is postponed until after COVID-19 pandemic. Patient is scheduled for follow-up with [facility]surgery on 01/31/2019. Encourage patient to contact [facility] regarding current symptoms.  Discuss possible moving up scan and surgery sooner as surgery schedule opening up.  Patient unsure if he is going to have surgery.  Strongly encourage assessment.  Send note to Dr Cheryll Cockayne and Towanda Malkin, PA- contacted. Continue monthly denosumab (last received 10/18/2018).  Xgeva today. Calcium 9.2.  Creatinine 0.92. Continue calcium 1200 mg vitamin D 800 IU daily. 4. Cancer-related pain       Pain medications increased to Fentanyl 75 mcg/hr and oxycodone 10 mg every 4 hours prn (uses 4-5/day).  Discuss follow-up with Josh Borders, NP. 5.   RTC in 3 weeks for MD assessment, labs (CBC with diff, CMP, TSH), and cycle #10 pembrolizumab.   I discussed the assessment and treatment plan with the patient.  The patient was provided an opportunity to ask questions and all were answered.  The patient agreed with the plan and demonstrated an understanding of the instructions.  The patient was advised to call back if the symptoms worsen or if the condition fails to improve as anticipated.   Lequita Asal, MD, PhD    11/29/2018, 10:05 AM  I, Molly Dorshimer, am acting as Education administrator for [employer]. Mike Gip, MD, PhD.  I, Jontue Crumpacker C. Mike Gip, MD, have reviewed the above documentation for accuracy and completeness, and I agree with the above.

## 2018-11-29 ENCOUNTER — Inpatient Hospital Stay: Payer: Medicare Other

## 2018-11-29 ENCOUNTER — Encounter: Payer: Self-pay | Admitting: Hematology and Oncology

## 2018-11-29 ENCOUNTER — Inpatient Hospital Stay (HOSPITAL_BASED_OUTPATIENT_CLINIC_OR_DEPARTMENT_OTHER): Payer: Medicare Other | Admitting: Hematology and Oncology

## 2018-11-29 ENCOUNTER — Inpatient Hospital Stay: Payer: Medicare Other | Attending: Hematology and Oncology

## 2018-11-29 VITALS — BP 111/63 | HR 66 | Temp 97.7°F | Resp 18 | Ht 66.0 in | Wt 158.7 lb

## 2018-11-29 VITALS — BP 188/78 | HR 58 | Temp 97.2°F | Resp 18

## 2018-11-29 DIAGNOSIS — C3431 Malignant neoplasm of lower lobe, right bronchus or lung: Secondary | ICD-10-CM | POA: Diagnosis not present

## 2018-11-29 DIAGNOSIS — Z79899 Other long term (current) drug therapy: Secondary | ICD-10-CM

## 2018-11-29 DIAGNOSIS — G893 Neoplasm related pain (acute) (chronic): Secondary | ICD-10-CM | POA: Diagnosis not present

## 2018-11-29 DIAGNOSIS — Z5112 Encounter for antineoplastic immunotherapy: Secondary | ICD-10-CM

## 2018-11-29 DIAGNOSIS — C7951 Secondary malignant neoplasm of bone: Secondary | ICD-10-CM | POA: Diagnosis not present

## 2018-11-29 DIAGNOSIS — Z5111 Encounter for antineoplastic chemotherapy: Secondary | ICD-10-CM | POA: Insufficient documentation

## 2018-11-29 DIAGNOSIS — Z95828 Presence of other vascular implants and grafts: Secondary | ICD-10-CM

## 2018-11-29 LAB — CBC WITH DIFFERENTIAL/PLATELET
Abs Immature Granulocytes: 0.04 10*3/uL (ref 0.00–0.07)
Basophils Absolute: 0.1 10*3/uL (ref 0.0–0.1)
Basophils Relative: 1 %
Eosinophils Absolute: 0.4 10*3/uL (ref 0.0–0.5)
Eosinophils Relative: 5 %
HCT: 41.6 % (ref 39.0–52.0)
Hemoglobin: 14 g/dL (ref 13.0–17.0)
Immature Granulocytes: 1 %
Lymphocytes Relative: 13 %
Lymphs Abs: 1 10*3/uL (ref 0.7–4.0)
MCH: 29.1 pg (ref 26.0–34.0)
MCHC: 33.7 g/dL (ref 30.0–36.0)
MCV: 86.5 fL (ref 80.0–100.0)
Monocytes Absolute: 0.6 10*3/uL (ref 0.1–1.0)
Monocytes Relative: 8 %
Neutro Abs: 5.8 10*3/uL (ref 1.7–7.7)
Neutrophils Relative %: 72 %
Platelets: 211 10*3/uL (ref 150–400)
RBC: 4.81 MIL/uL (ref 4.22–5.81)
RDW: 15.5 % (ref 11.5–15.5)
WBC: 7.9 10*3/uL (ref 4.0–10.5)
nRBC: 0 % (ref 0.0–0.2)

## 2018-11-29 LAB — COMPREHENSIVE METABOLIC PANEL
ALT: 19 U/L (ref 0–44)
AST: 23 U/L (ref 15–41)
Albumin: 4 g/dL (ref 3.5–5.0)
Alkaline Phosphatase: 66 U/L (ref 38–126)
Anion gap: 7 (ref 5–15)
BUN: 9 mg/dL (ref 8–23)
CO2: 26 mmol/L (ref 22–32)
Calcium: 9.2 mg/dL (ref 8.9–10.3)
Chloride: 105 mmol/L (ref 98–111)
Creatinine, Ser: 0.92 mg/dL (ref 0.61–1.24)
GFR calc Af Amer: >60 mL/min (ref >60)
GFR calc non Af Amer: >60 mL/min (ref >60)
Glucose, Bld: 119 mg/dL — ABNORMAL HIGH (ref 70–99)
Potassium: 3.8 mmol/L (ref 3.5–5.1)
Sodium: 138 mmol/L (ref 135–145)
Total Bilirubin: 0.6 mg/dL (ref 0.3–1.2)
Total Protein: 7 g/dL (ref 6.5–8.1)

## 2018-11-29 LAB — LACTATE DEHYDROGENASE: LDH: 165 U/L (ref 98–192)

## 2018-11-29 LAB — TSH: TSH: 1.671 u[IU]/mL (ref 0.350–4.500)

## 2018-11-29 MED ORDER — SODIUM CHLORIDE 0.9 % IV SOLN
200.0000 mg | Freq: Once | INTRAVENOUS | Status: AC
  Administered 2018-11-29: 200 mg via INTRAVENOUS
  Filled 2018-11-29: qty 8

## 2018-11-29 MED ORDER — SODIUM CHLORIDE 0.9% FLUSH
10.0000 mL | INTRAVENOUS | Status: DC | PRN
  Administered 2018-11-29: 10 mL via INTRAVENOUS
  Filled 2018-11-29: qty 10

## 2018-11-29 MED ORDER — DENOSUMAB 120 MG/1.7ML ~~LOC~~ SOLN
120.0000 mg | Freq: Once | SUBCUTANEOUS | Status: AC
  Administered 2018-11-29: 120 mg via SUBCUTANEOUS

## 2018-11-29 MED ORDER — SODIUM CHLORIDE 0.9 % IV SOLN
Freq: Once | INTRAVENOUS | Status: AC
  Administered 2018-11-29: 11:00:00 via INTRAVENOUS
  Filled 2018-11-29: qty 250

## 2018-11-29 MED ORDER — HEPARIN SOD (PORK) LOCK FLUSH 100 UNIT/ML IV SOLN
500.0000 [IU] | Freq: Once | INTRAVENOUS | Status: AC
  Administered 2018-11-29: 500 [IU] via INTRAVENOUS

## 2018-11-29 NOTE — Progress Notes (Signed)
The patient c/o pain noted to center of his back/ pain level 2 today, Occasional

## 2018-11-29 NOTE — Addendum Note (Signed)
Addended by: Zara Chess on: 11/29/2018 12:22 PM   Modules accepted: Orders

## 2018-11-29 NOTE — Patient Instructions (Signed)
Pembrolizumab injection  What is this medicine?  PEMBROLIZUMAB (pem broe liz ue mab) is a monoclonal antibody. It is used to treat cervical cancer, esophageal cancer, head and neck cancer, hepatocellular cancer, Hodgkin lymphoma, kidney cancer, lymphoma, melanoma, Merkel cell carcinoma, lung cancer, stomach cancer, urothelial cancer, and cancers that have a certain genetic condition.  This medicine may be used for other purposes; ask your health care provider or pharmacist if you have questions.  COMMON BRAND NAME(S): Keytruda  What should I tell my health care provider before I take this medicine?  They need to know if you have any of these conditions:  -diabetes  -immune system problems  -inflammatory bowel disease  -liver disease  -lung or breathing disease  -lupus  -received or scheduled to receive an organ transplant or a stem-cell transplant that uses donor stem cells  -an unusual or allergic reaction to pembrolizumab, other medicines, foods, dyes, or preservatives  -pregnant or trying to get pregnant  -breast-feeding  How should I use this medicine?  This medicine is for infusion into a vein. It is given by a health care professional in a hospital or clinic setting.  A special MedGuide will be given to you before each treatment. Be sure to read this information carefully each time.  Talk to your pediatrician regarding the use of this medicine in children. While this drug may be prescribed for selected conditions, precautions do apply.  Overdosage: If you think you have taken too much of this medicine contact a poison control center or emergency room at once.  NOTE: This medicine is only for you. Do not share this medicine with others.  What if I miss a dose?  It is important not to miss your dose. Call your doctor or health care professional if you are unable to keep an appointment.  What may interact with this medicine?  Interactions have not been studied.  Give your health care provider a list of all the  medicines, herbs, non-prescription drugs, or dietary supplements you use. Also tell them if you smoke, drink alcohol, or use illegal drugs. Some items may interact with your medicine.  This list may not describe all possible interactions. Give your health care provider a list of all the medicines, herbs, non-prescription drugs, or dietary supplements you use. Also tell them if you smoke, drink alcohol, or use illegal drugs. Some items may interact with your medicine.  What should I watch for while using this medicine?  Your condition will be monitored carefully while you are receiving this medicine.  You may need blood work done while you are taking this medicine.  Do not become pregnant while taking this medicine or for 4 months after stopping it. Women should inform their doctor if they wish to become pregnant or think they might be pregnant. There is a potential for serious side effects to an unborn child. Talk to your health care professional or pharmacist for more information. Do not breast-feed an infant while taking this medicine or for 4 months after the last dose.  What side effects may I notice from receiving this medicine?  Side effects that you should report to your doctor or health care professional as soon as possible:  -allergic reactions like skin rash, itching or hives, swelling of the face, lips, or tongue  -bloody or black, tarry  -breathing problems  -changes in vision  -chest pain  -chills  -confusion  -constipation  -cough  -diarrhea  -dizziness or feeling faint or lightheaded  -  fast or irregular heartbeat  -fever  -flushing  -hair loss  -joint pain  -low blood counts - this medicine may decrease the number of white blood cells, red blood cells and platelets. You may be at increased risk for infections and bleeding.  -muscle pain  -muscle weakness  -persistent headache  -redness, blistering, peeling or loosening of the skin, including inside the mouth  -signs and symptoms of high blood sugar  such as dizziness; dry mouth; dry skin; fruity breath; nausea; stomach pain; increased hunger or thirst; increased urination  -signs and symptoms of kidney injury like trouble passing urine or change in the amount of urine  -signs and symptoms of liver injury like dark urine, light-colored stools, loss of appetite, nausea, right upper belly pain, yellowing of the eyes or skin  -sweating  -swollen lymph nodes  -weight loss  Side effects that usually do not require medical attention (report to your doctor or health care professional if they continue or are bothersome):  -decreased appetite  -muscle pain  -tiredness  This list may not describe all possible side effects. Call your doctor for medical advice about side effects. You may report side effects to FDA at 1-800-FDA-1088.  Where should I keep my medicine?  This drug is given in a hospital or clinic and will not be stored at home.  NOTE: This sheet is a summary. It may not cover all possible information. If you have questions about this medicine, talk to your doctor, pharmacist, or health care provider.   2019 [employer]vier/Gold Standard (2018-02-18 15:06:10)

## 2018-11-30 LAB — T4: T4, Total: 8.3 ug/dL (ref 4.5–12.0)

## 2018-12-10 ENCOUNTER — Telehealth: Payer: Self-pay | Admitting: *Deleted

## 2018-12-10 ENCOUNTER — Other Ambulatory Visit: Payer: Self-pay

## 2018-12-10 ENCOUNTER — Inpatient Hospital Stay (HOSPITAL_BASED_OUTPATIENT_CLINIC_OR_DEPARTMENT_OTHER): Payer: Medicare Other | Admitting: Hospice and Palliative Medicine

## 2018-12-10 DIAGNOSIS — G893 Neoplasm related pain (acute) (chronic): Secondary | ICD-10-CM | POA: Diagnosis not present

## 2018-12-10 DIAGNOSIS — Z515 Encounter for palliative care: Secondary | ICD-10-CM | POA: Diagnosis not present

## 2018-12-10 DIAGNOSIS — F329 Major depressive disorder, single episode, unspecified: Secondary | ICD-10-CM

## 2018-12-10 DIAGNOSIS — F32A Depression, unspecified: Secondary | ICD-10-CM

## 2018-12-10 MED ORDER — FLUOXETINE HCL 20 MG PO CAPS: 20.0000 mg | ORAL_CAPSULE | Freq: Every day | ORAL | 3 refills | Status: DC

## 2018-12-10 NOTE — Telephone Encounter (Signed)
I called and spoke with patient. Full note to follow.

## 2018-12-10 NOTE — Progress Notes (Signed)
Virtual Visit via Telephone Note  I connected with Jimmy West on 12/10/18 at  2:00 PM EDT by telephone and verified that I am speaking with the correct person using two identifiers.   I discussed the limitations, risks, security and privacy concerns of performing an evaluation and management service by telephone and the availability of in person appointments. I also discussed with the patient that there may be a patient responsible charge related to this service. The patient expressed understanding and agreed to proceed.   History of Present Illness: Palliative care follow-up for this 68 year old male with multiple medical problems including presumed metastatic lung cancer. Initially,patient presented withpulmonary nodules from chest x-ray ordered by PCP. Subsequently referred to oncology for work-up and was evaluated by Dr. Mike Gip on 04/30/2018. PET scan revealed several hypermetabolic pulmonary nodules and activity within the T4 vertebral body. Follow-up MRI of the spine revealed T4 metastasis with pathological compression deformity and mild spinal canal stenosis with probable edema and extraosseous extension of neoplasm from mid T3 to mid T4. He was referred to palliative care clinic to assist with symptom management and to establish treatment goals.    Observations/Objective: Patient reports that his pain is stable on current regimen with fentanyl and prn oxycodone.   Patient wanted to ask about risk of COVID-19 in patients with advanced cancer.  We spoke about risks and strategies to mitigate transmission.  He is contemplating whether or not to return to work.  Patient also endorses depressive symptoms.  He denies SI/HI.  He feels down given social isolation and financial stressors with not being able to work.  Patient is agreeable to trial of an antidepressant.  He is also agreeable to a referral for counseling.  Assessment and Plan: -Start fluoxetine 20mg  daily -Hold  ibuprofen -Referral to Sandi, LCSW -Discussed risk mitigation strategies for COVID-19  Follow Up Instructions: RTC in 1 month   I discussed the assessment and treatment plan with the patient. The patient was provided an opportunity to ask questions and all were answered. The patient agreed with the plan and demonstrated an understanding of the instructions.   The patient was advised to call back or seek an in-person evaluation if the symptoms worsen or if the condition fails to improve as anticipated.  I provided 30 minutes of non-face-to-face time during this encounter.   Irean Hong, NP

## 2018-12-10 NOTE — Telephone Encounter (Signed)
Patient called requesting Sharion Dove, NP call him back as he has "some questions"  [phone]

## 2018-12-14 ENCOUNTER — Other Ambulatory Visit: Payer: Self-pay | Admitting: *Deleted

## 2018-12-14 MED ORDER — OXYCODONE HCL 10 MG PO TABS: 10.0000 mg | ORAL_TABLET | ORAL | 0 refills | Status: DC | PRN

## 2018-12-14 NOTE — Telephone Encounter (Signed)
Requesting refill of oxycodone to be sent to [facility].

## 2018-12-16 ENCOUNTER — Telehealth: Payer: Self-pay | Admitting: Hospice and Palliative Medicine

## 2018-12-17 ENCOUNTER — Inpatient Hospital Stay (HOSPITAL_BASED_OUTPATIENT_CLINIC_OR_DEPARTMENT_OTHER): Payer: Medicare Other | Admitting: Hospice and Palliative Medicine

## 2018-12-17 DIAGNOSIS — Z515 Encounter for palliative care: Secondary | ICD-10-CM

## 2018-12-17 DIAGNOSIS — F329 Major depressive disorder, single episode, unspecified: Secondary | ICD-10-CM

## 2018-12-17 DIAGNOSIS — G893 Neoplasm related pain (acute) (chronic): Secondary | ICD-10-CM

## 2018-12-17 NOTE — Progress Notes (Signed)
[facility]  [address] [place], [place] [address] Phone: [phone]  Fax: [phone]   Clinic Day:  12/20/2018  Referring physician: [name], NP  Chief Complaint: Jimmy West is a 68 y.o. male with with metastatic adenocarcinoma of the lung who is seen for assessment prior to cycle #10 pembrolizumab.  HPI: The patient was last seen in the medical oncology clinic on 11/29/2018. At that time, he was doing well.  Pain had improved with increase of his pain medications.  He had intermittent numbness in his mid back.  Exam revealed mild T4 paravertebral muscle tenderness to palpation.  Labs were unremarkable. He received cycle #9 pembrolizumab.  He received Xgeva.  He was contacted by the [facility] on 12/02/2018. He opted to keep his follow-up appointment in July, as opposed to moving it earlier due to back pain.   He saw Dr. Vonna Kotyk Borders via telemedicine on 12/10/2018.  He was started on fluoxetine 60m daily and referred to social work for depression and financial stress due to COVID-19.   During the interim, he reports his back has been "pretty good."  He attributes this to not working and receiving radiation. He takes 1 oxycodone every 4-6 hours. He denies waking up in the middle of the night. He denies any numbness or tingling in his legs. Tingling in his back has improved.   He denies any shortness of breath. He notes a productive cough last week, that has resolved.    Past Medical History:  Diagnosis Date   Bulging lumbar disc    Cancer ([facility]    Heart attack ([facility]     Past Surgical History:  Procedure Laterality Date   CARDIAC CATHETERIZATION     two stents   KNEE SURGERY Left    PORTA CATH INSERTION N/A 05/21/2018   Procedure: PORTA CATH INSERTION;  Surgeon: DAlgernon Huxley MD;  Location: [facility];  Service: Cardiovascular;  Laterality: N/A;    Family History  Problem Relation Age of  Onset   Heart failure Father    Cancer Maternal Aunt    Heart failure Maternal Uncle    Dementia Paternal Grandmother    Cancer Maternal Aunt     Social History:  reports that he quit smoking about 15 years ago. His smoking use included cigarettes. He has a 22.50 pack-year smoking history. He has never used smokeless tobacco. He reports current alcohol use of about 15.0 standard drinks of alcohol per week. He reports that he does not use drugs. He started smoking at age 68 He is smoking 1 1/2 packs/day. 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 Patient denies known exposures to radiation on toxins. The patient is alone today.  Allergies: No Known Allergies  Current Medications: Current Outpatient Medications  Medication Sig Dispense Refill   aspirin EC 81 MG tablet Take 81 mg by mouth daily.     Calcium 600-400 MG-UNIT CHEW Chew 2 tablets by mouth daily.     fentaNYL (DURAGESIC) 75 MCG/HR Place 1 patch onto the skin every 3 (three) days. 10 patch 0   FLUoxetine (PROZAC) 20 MG capsule Take 1 capsule (20 mg total) by mouth daily. 30 capsule 3   Oxycodone HCl 10 MG TABS Take 1-2 tablets (10-20 mg  total) by mouth every 4 (four) hours as needed. 90 tablet 0   pembrolizumab (KEYTRUDA) 100 MG/4ML SOLN Inject 2 mg/kg into the vein every 30 (thirty) days.      acetaminophen (TYLENOL) 500 MG tablet Take 500 mg by mouth 3 (three) times daily as needed.     ibuprofen (ADVIL,MOTRIN) 200 MG tablet Take 200 mg by mouth every 6 (six) hours as needed.     lidocaine-prilocaine (EMLA) cream Apply to affected area once (Patient not taking: Reported on 11/18/2018) 30 g 3   naloxone (NARCAN) nasal spray 4 mg/0.1 mL 1 spray by nose in the event of opioid overdose. Call EMS (911) immediately if medication is used.  (Patient not taking: Reported on 10/13/2018) 1 kit 0   ondansetron (ZOFRAN) 8 MG tablet Take 1 tablet (8 mg total) by mouth 2 (two) times daily as needed (Nausea or vomiting). Start if needed on the third day after chemotherapy. (Patient not taking: Reported on 10/13/2018) 30 tablet 1   polyethylene glycol (MIRALAX / GLYCOLAX) packet Take 17 g by mouth daily.     senna (SENOKOT) 8.6 MG tablet Take 1 tablet by mouth as needed.      No current facility-administered medications for this visit.    Facility-Administered Medications Ordered in Other Visits  Medication Dose Route Frequency Provider Last Rate Last Dose   heparin lock flush 100 unit/mL  500 Units Intravenous Once Monterio Bob C, MD       sodium chloride flush (NS) 0.9 % injection 10 mL  10 mL Intravenous PRN Lequita Asal, MD   10 mL at 12/20/18 0931    Review of Systems  Constitutional: Positive for weight loss (1lb). Negative for chills, diaphoresis, fever and malaise/fatigue.  HENT: Negative.  Negative for congestion, ear pain, nosebleeds, sinus pain and sore throat.   Eyes: Negative.  Negative for blurred vision, double vision, photophobia and pain.  Respiratory: Positive for cough (productive, resolved). Negative for hemoptysis, sputum production, shortness of breath and wheezing.   Cardiovascular: Negative.  Negative for chest pain, palpitations, orthopnea, leg swelling and PND.  Gastrointestinal: Negative.  Negative for abdominal pain, blood in stool, constipation, diarrhea, heartburn, melena, nausea and vomiting.  Genitourinary: Negative.  Negative for dysuria, frequency, hematuria and urgency.  Musculoskeletal: Positive for back pain (well controlled). Negative for falls, joint pain, myalgias and neck pain.  Skin: Negative.  Negative for itching and rash.  Neurological: Positive for tingling (in mid back, occasional) and sensory change (intermittent numbness in mid back (see HPI); chronic numbness to LEFT heel  2/2 spinal disc issues). Negative for dizziness, tremors, speech change, focal weakness, weakness and headaches.  Endo/Heme/Allergies: Negative.  Does not bruise/bleed easily.  Psychiatric/Behavioral: Negative.  Negative for depression and memory loss. The patient is not nervous/anxious and does not have insomnia.   All other systems reviewed and are negative.  Performance status (ECOG):  1  Blood pressure (!) 143/87, pulse 60, temperature 97.8 F (36.6 C), resp. rate 18, height _0  (1.676 m), weight 157 lb 6.5 oz (71.4 kg).   Physical Exam  Constitutional: He is oriented to person, place, and time. He appears well-developed and well-nourished. No distress.  Thin gentleman sitting comfortably in the exam room in no acute distress.  HENT:  Head: Normocephalic and atraumatic.  Mouth/Throat: Oropharynx is clear and moist. He has dentures. No oropharyngeal exudate.  Brown hair with slight graying.  Mustache.  Dentures.  Wearing a mask.    Eyes: Pupils are equal, round,  and reactive to light. Conjunctivae and EOM are normal. No scleral icterus.  Glasses.  Gray blue eyes.   Neck: Normal range of motion. Neck supple. No JVD present.  Cardiovascular: Normal rate, regular rhythm and normal heart sounds. Exam reveals no gallop and no friction rub.  No murmur heard. Pulmonary/Chest: Effort normal and breath sounds normal. No respiratory distress. He has no wheezes. He has no rales.  Abdominal: Soft. Bowel sounds are normal. He exhibits no distension and no mass. There is no abdominal tenderness. There is no rebound and no guarding.  Musculoskeletal: Normal range of motion.        General: No tenderness or edema.     Thoracic back: Tenderness: left T4 paravertebral muscle slightly uncomfortable to palpation.     Lumbar back: He exhibits no tenderness.  Lymphadenopathy:    He has no cervical adenopathy.    He has no axillary adenopathy.       Right: No supraclavicular adenopathy present.        Left: No supraclavicular adenopathy present.  Neurological: He is alert and oriented to person, place, and time.  Skin: Skin is warm and dry. No rash noted. He is not diaphoretic. No erythema. No pallor.  Psychiatric: He has a normal mood and affect. His behavior is normal. Judgment and thought content normal.  Nursing note and vitals reviewed.   Imaging studies: 04/27/2018:Chest CT angiogramrevealed no evidence of significant pulmonary embolus. There was a 4.1 cm mass in the superior segment right lower lobe posteriorly causing obliteration of the in coming bronchus and mild postobstructive change. There were multiple additional bilateral pulmonary nodules (<1 cm - 1.4 cm). There were enlarged right hilar lymph nodes. There was metastasis to the T4 vertebra with mild associated compression. There was emphysematous changes in the lungs. 04/30/2018:PET scanrevealed a 3.4 cm hypermetabolic RLL pulmonary mass (SUV 13.2), 11 mm pulmonary nodule in the LEFT lung (SUV 4.5), RIGHT paratracheal lymph node (SUV 5.1), and hypermetabolic activity within the T4 vertebral body (SUV 10.2).  05/03/2018:Thoracic spine MRIrevealed T4 metastasis with a 40% pathologic compression deformity and 5 mm of retropulsion of the vertebral body. Retropulsion results in mild spinal canal stenosis and mild bilateral C4-5 foraminal stenosis. There was paravertebral soft tissue thickening from mid T3 to mid T5, likely representing edema related to the pathologic compression deformity vs. possible extraosseous extension of the neoplasm. There were no additional thoracic spinal metastases noted.  05/03/2018:Head MRIrevealed no intracranial metastatic disease. 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 Incidental mention made of mild paranasal  sinus disease. 08/17/2018:[facility] CT at [facility] on 08/17/2018 revealedpathologic compression fracture of T4resulting in vertebra plana. Fracture fragments protrude into the spinal canal resulting in moderate to severe canal narrowingat T4. There was severe right and moderate left foraminal narrowing at T4-T5 secondary to vertebra plana. There were multiple spiculated pulmonary masses consistent with metastatic adenocarcinoma, similar in appearance to 04/30/2018 PET/CT.  08/26/2018:Chest CTrevealed a 3.1 cm right lower lobe mass, corresponding to known primary bronchogenic neoplasm, mildly decreased. Bilateral pulmonary nodules/metastases, most of which are improved. There was mildly progressive pleural-based nodularity in the right upper lobe along the minor fissure, including a dominant 17 x 11 mm nodule. There was a small right pleural effusion, decreased. There was a 15 mm left adrenal metastasis, decreased.  10/29/2018:[facility] revealed[facility] severe compression deformity/vertebral  vertebra plana of T4. There was increased sclerotic changes within the collapsed vertebral body possibly due to interval radiation. There was a stable degree of retropulsion and focal mild spinal canal stenosis at T4. There was interval enlargement of the left and right upper lobe spiculated pulmonary nodules and right lower lobe mass.  11/02/2018:Chest, abdomen, and pelvic CTrevealed the dominant right lower lobe mass has slightly enlarged compared with the most recent CT of 2 months ago, although demonstrated central necrosis. Current size was more similar to that demonstrated on the PET-CT. Additional smaller pulmonary nodules were stable to slightly improved. No definite new nodules. Small right hilar and mediastinal lymph nodes were stable. There was persistent pleural thickening and small loculated right pleural effusion. There was stable pathologic fracture at T4. No new  osseous metastases or fractures identified. There was no evidence of metastatic disease in the abdomen or pelvis. Images were reviewed with Dr Kathlene Cote. Lung lesions weresmaller. There was central necrosis in the RLL lesion. There wasno evidence of progressive disease.   Infusion on 12/20/2018  Component Date Value Ref Range Status   Sodium 12/20/2018 135  135 - 145 mmol/L Final   Potassium 12/20/2018 3.8  3.5 - 5.1 mmol/L Final   Chloride 12/20/2018 102  98 - 111 mmol/L Final   CO2 12/20/2018 23  22 - 32 mmol/L Final   Glucose, Bld 12/20/2018 108* 70 - 99 mg/dL Final   BUN 12/20/2018 12  8 - 23 mg/dL Final   Creatinine, Ser 12/20/2018 0.82  0.61 - 1.24 mg/dL Final   Calcium 12/20/2018 8.9  8.9 - 10.3 mg/dL Final   Total Protein 12/20/2018 7.4  6.5 - 8.1 g/dL Final   Albumin 12/20/2018 4.1  3.5 - 5.0 g/dL Final   AST 12/20/2018 19  15 - 41 U/L Final   ALT 12/20/2018 15  0 - 44 U/L Final   Alkaline Phosphatase 12/20/2018 65  38 - 126 U/L Final   Total Bilirubin 12/20/2018 0.9  0.3 - 1.2 mg/dL Final   GFR calc non Af Amer 12/20/2018 >60  >60 mL/min Final   GFR calc Af Amer 12/20/2018 >60  >60 mL/min Final   Anion gap 12/20/2018 10  5 - 15 Final   Performed at [facility], [address], [place], [place] [address]   WBC 12/20/2018 7.9  4.0 - 10.5 K/uL Final   RBC 12/20/2018 4.54  4.22 - 5.81 MIL/uL Final   Hemoglobin 12/20/2018 13.4  13.0 - 17.0 g/dL Final   HCT 12/20/2018 39.8  39.0 - 52.0 % Final   MCV 12/20/2018 87.7  80.0 - 100.0 fL Final   MCH 12/20/2018 29.5  26.0 - 34.0 pg Final   MCHC 12/20/2018 33.7  30.0 - 36.0 g/dL Final   RDW 12/20/2018 14.9  11.5 - 15.5 % Final   Platelets 12/20/2018 280  150 - 400 K/uL Final   nRBC 12/20/2018 0.0  0.0 - 0.2 % Final   Neutrophils Relative % 12/20/2018 68  % Final   Neutro Abs 12/20/2018 5.4  1.7 - 7.7 K/uL Final   Lymphocytes Relative 12/20/2018 14  % Final   Lymphs Abs 12/20/2018 1.1   0.7 - 4.0 K/uL Final   Monocytes Relative 12/20/2018 10  % Final   Monocytes Absolute 12/20/2018 0.8  0.1 - 1.0 K/uL Final   Eosinophils Relative 12/20/2018 7  % Final   Eosinophils Absolute 12/20/2018 0.6* 0.0 - 0.5 K/uL Final   Basophils Relative 12/20/2018 1  %  Final   Basophils Absolute 12/20/2018 0.1  0.0 - 0.1 K/uL Final   Immature Granulocytes 12/20/2018 0  % Final   Abs Immature Granulocytes 12/20/2018 0.03  0.00 - 0.07 K/uL Final   Performed at [facility], [address], [place], [place] [address]    Assessment:  Jimmy West is a 68 y.o. male with metastatichigh-grade adenocarcinomaof the right lungs/p CT-guided biopsy of a RLL lung mass on 05/11/2018. Pathologyrevealed an invasive high-grade adenocarcinoma, with predominantly solid growth pattern. The neoplastic cells were TTF-1 (+), Napsin A (+), and P40 (+). He has a T4 vertebral metastasis. Clinical stage is T4N1M1.  There was not enough material for [facility] One testing. PDL-1revealed TPS 90%.  PET scanon 04/30/2018 revealed a 3.4 cm hypermetabolic RLL pulmonary mass (SUV 13.2), 11 mm pulmonary nodule in the LEFT lung (SUV 4.5), RIGHT paratracheal lymph node (SUV 5.1), and hypermetabolic activity within the T4 vertebral body (SUV 10.2).   Thoracic spine MRIon 05/03/2018 revealed T4 metastasiswith a 40% pathologic compression deformity and 5 mm of retropulsion of the vertebral body. Retropulsion results in mild spinal canal stenosis and mild bilateral C4-5 foraminal stenosis. There was paravertebral soft tissue thickening from mid T3 to mid T5, likely representing edema related to the pathologic compression deformity vs. possible extraosseous extension of the neoplasm. There were no additional thoracic spinal metastases noted.   Head MRIon 05/03/2018 revealed no intracranial metastatic disease. There were mild chronic microvascular ischemic changes and volume loss of brain, in  addition to small chronic cortical infarctions within the left parietal lobe and small right caudate head chronic lacunar infarct. Incidental mention made of mild paranasal sinus disease.  He is s/p cycle #9 pembrolizumab(05/24/2018 - 11/29/2018). He is tolerating treatment well. CEAwas 1.3 on 08/30/2018.  LDH was 165 on 11/29/2018.  He completed T4 radiationon 07/07/2018. He receives Xgevamonthly (06/03/2018 - 11/29/2018).  He has cancer-related pain in T4.  He is currently taking Fentanyl 75 mcg/hr and oxycodone 10 mg every 4 hours prn   [facility] on [facility]/2018 revealedunchanged severe compression deformity/vertebral vertebra plana of T4. There was increased sclerotic changes within the collapsed vertebral body possibly due to interval radiation. There was a stable degree of retropulsion and focal mild spinal canal stenosis at T4. There was interval enlargement of the left and right upper lobe spiculated pulmonary nodules and right lower lobe mass.   Chest, abdomen, and pelvic CTon 11/02/2018 revealed the dominant right lower lobe mass has slightly enlarged compared with the most recent CT of 2 months ago, although demonstrated central necrosis. Current size was more similar to that demonstrated on the PET-CT. Additional smaller pulmonary nodules were stable to slightly improved. No definite new nodules. Small right hilar and mediastinal lymph nodes were stable. There was persistent pleural thickening and small loculated right pleural effusion. There was stable pathologic fracture at T4. No new osseous metastases or fractures identified. There was no evidence of metastatic disease in the abdomen or pelvis. Images were reviewed with Dr Kathlene Cote. Lesions weresmaller. There was central necrosis in the RLL lesion. There wasno evidence of progressive disease.  Symptomatically, he is doing well.  He has minimal back discomfort.  He denies any shortness of breath.   Exam is stable.  Plan: 1. Labs today:CBC with diff, CMP, TSH. 2. Metastatic high-grade adenocarcinoma the RIGHT lung Clinically, he is doing well. Labs reviewed. Cycle # 10pembrolizumab today. Discuss plans or follow-up imaging in mid 01/2019. Discuss symptom management.  He has antiemetics  and pain medications at home to use on a prn bases.  Interventions are adequate.      3. T4 osseous metastasis Patient followed [facility] . Surgeryhas been postponed until after COVID-19 restrictions lifted. Patient to be seen in follow[facility]up by [facility]surgeryon 01/31/2019. Patient declined moving up scans at [facility]. Continue monthly Xgeva (last 11/29/2018)   Continue calcium 1200 mg and vitamin D 800 IU daily. 4. Cancer-related pain Pain appears well controlled as he is not working.   Patient plans to start back at work tomorrow.  Patient on Fentanyl patch 75 mcg/hr and oxycodone 10 mg every 4 - 6 hours prn .       Patient sees Billey Chang, NP, in palliative care medicine. 5. RTC in 3 weeks for MD assessment, labs (CBC with diff, CMP, TSH), Xgeva, and cycle #11 pembrolizumab.   I discussed the assessment and treatment plan with the patient.  The patient was provided an opportunity to ask questions and all were answered.  The patient agreed with the plan and demonstrated an understanding of the instructions.  The patient was advised to call back if the symptoms worsen or if the condition fails to improve as anticipated.   Lequita Asal, MD, PhD    12/20/2018, 10:14 AM  I, Molly Dorshimer, am acting as Education administrator for [employer]. Mike Gip, MD, PhD.  I, Annlee Glandon C. Mike Gip, MD, have reviewed the above documentation for accuracy and completeness, and I agree with the above.

## 2018-12-17 NOTE — Progress Notes (Signed)
Virtual Visit via Telephone Note  I connected with Jimmy West on 12/17/18 at  9:30 AM EDT by telephone and verified that I am speaking with the correct person using two identifiers.   I discussed the limitations, risks, security and privacy concerns of performing an evaluation and management service by telephone and the availability of in person appointments. I also discussed with the patient that there may be a patient responsible charge related to this service. The patient expressed understanding and agreed to proceed.   History of Present Illness: Palliative care follow-up for this 68 year old male with multiple medical problems including presumed metastatic lung cancer. Initially,patient presented withpulmonary nodules from chest x-ray ordered by PCP. Subsequently referred to oncology for work-up and was evaluated by Dr. Mike Gip on 04/30/2018. PET scan revealed several hypermetabolic pulmonary nodules and activity within the T4 vertebral body. Follow-up MRI of the spine revealed T4 metastasis with pathological compression deformity and mild spinal canal stenosis with probable edema and extraosseous extension of neoplasm from mid T3 to mid T4. He was referred to palliative care clinic to assist with symptom management and to establish treatment goals.    Observations/Objective: Routine follow-up visit today by phone.  Patient reports that he is doing reasonably well without any changes or concerns since we last spoke.  He reports tolerating initiation of fluoxetine well without any adverse effects.  He cannot tell any significant improvement yet on his moods but I explained that it may take 3 to 4 weeks before he notes any changes.  Would consider increasing fluoxetine to 40 mg daily at that time if no significant clinical improvement.  He received a call from [place], [place] but missed the call.  He says he intends to return her call soon.  Patient says that he has decided to return to  work.  We again discussed ways to mitigate transmission of COVID-19.  Pain is reportedly stable on current regimen.  Would anticipate that pain may worsen as patient returns to work.  Assessment and Plan: -Continue fluoxetine 20mg  daily -Continue fentanyl 46mcg Q72H -Continue oxycodone 10-20mg  Q4H prn -Referral to Sandi, LCSW -Discussed risk mitigation strategies for COVID-19  Follow Up Instructions: RTC in 1 month   I discussed the assessment and treatment plan with the patient. The patient was provided an opportunity to ask questions and all were answered. The patient agreed with the plan and demonstrated an understanding of the instructions.   The patient was advised to call back or seek an in-person evaluation if the symptoms worsen or if the condition fails to improve as anticipated.  I provided 10 minutes of non-face-to-face time during this encounter.   Irean Hong, NP

## 2018-12-20 ENCOUNTER — Encounter: Payer: Self-pay | Admitting: Hematology and Oncology

## 2018-12-20 ENCOUNTER — Inpatient Hospital Stay: Payer: Medicare Other

## 2018-12-20 ENCOUNTER — Inpatient Hospital Stay: Payer: Medicare Other | Attending: Hematology and Oncology | Admitting: Hematology and Oncology

## 2018-12-20 ENCOUNTER — Other Ambulatory Visit: Payer: Self-pay

## 2018-12-20 VITALS — BP 143/87 | HR 60 | Temp 97.8°F | Resp 18 | Ht 66.0 in | Wt 157.4 lb

## 2018-12-20 DIAGNOSIS — Z923 Personal history of irradiation: Secondary | ICD-10-CM

## 2018-12-20 DIAGNOSIS — Z79899 Other long term (current) drug therapy: Secondary | ICD-10-CM | POA: Diagnosis not present

## 2018-12-20 DIAGNOSIS — G893 Neoplasm related pain (acute) (chronic): Secondary | ICD-10-CM | POA: Diagnosis not present

## 2018-12-20 DIAGNOSIS — C7951 Secondary malignant neoplasm of bone: Secondary | ICD-10-CM

## 2018-12-20 DIAGNOSIS — C3431 Malignant neoplasm of lower lobe, right bronchus or lung: Secondary | ICD-10-CM

## 2018-12-20 DIAGNOSIS — J90 Pleural effusion, not elsewhere classified: Secondary | ICD-10-CM | POA: Diagnosis not present

## 2018-12-20 DIAGNOSIS — Z5112 Encounter for antineoplastic immunotherapy: Secondary | ICD-10-CM | POA: Diagnosis not present

## 2018-12-20 LAB — COMPREHENSIVE METABOLIC PANEL
ALT: 15 U/L (ref 0–44)
AST: 19 U/L (ref 15–41)
Albumin: 4.1 g/dL (ref 3.5–5.0)
Alkaline Phosphatase: 65 U/L (ref 38–126)
Anion gap: 10 (ref 5–15)
BUN: 12 mg/dL (ref 8–23)
CO2: 23 mmol/L (ref 22–32)
Calcium: 8.9 mg/dL (ref 8.9–10.3)
Chloride: 102 mmol/L (ref 98–111)
Creatinine, Ser: 0.82 mg/dL (ref 0.61–1.24)
GFR calc Af Amer: >60 mL/min (ref >60)
GFR calc non Af Amer: >60 mL/min (ref >60)
Glucose, Bld: 108 mg/dL — ABNORMAL HIGH (ref 70–99)
Potassium: 3.8 mmol/L (ref 3.5–5.1)
Sodium: 135 mmol/L (ref 135–145)
Total Bilirubin: 0.9 mg/dL (ref 0.3–1.2)
Total Protein: 7.4 g/dL (ref 6.5–8.1)

## 2018-12-20 LAB — CBC WITH DIFFERENTIAL/PLATELET
Abs Immature Granulocytes: 0.03 10*3/uL (ref 0.00–0.07)
Basophils Absolute: 0.1 10*3/uL (ref 0.0–0.1)
Basophils Relative: 1 %
Eosinophils Absolute: 0.6 10*3/uL — ABNORMAL HIGH (ref 0.0–0.5)
Eosinophils Relative: 7 %
HCT: 39.8 % (ref 39.0–52.0)
Hemoglobin: 13.4 g/dL (ref 13.0–17.0)
Immature Granulocytes: 0 %
Lymphocytes Relative: 14 %
Lymphs Abs: 1.1 10*3/uL (ref 0.7–4.0)
MCH: 29.5 pg (ref 26.0–34.0)
MCHC: 33.7 g/dL (ref 30.0–36.0)
MCV: 87.7 fL (ref 80.0–100.0)
Monocytes Absolute: 0.8 10*3/uL (ref 0.1–1.0)
Monocytes Relative: 10 %
Neutro Abs: 5.4 10*3/uL (ref 1.7–7.7)
Neutrophils Relative %: 68 %
Platelets: 280 10*3/uL (ref 150–400)
RBC: 4.54 MIL/uL (ref 4.22–5.81)
RDW: 14.9 % (ref 11.5–15.5)
WBC: 7.9 10*3/uL (ref 4.0–10.5)
nRBC: 0 % (ref 0.0–0.2)

## 2018-12-20 LAB — TSH: TSH: 2.795 u[IU]/mL (ref 0.350–4.500)

## 2018-12-20 MED ORDER — SODIUM CHLORIDE 0.9% FLUSH
10.0000 mL | INTRAVENOUS | Status: DC | PRN
  Administered 2018-12-20: 10 mL via INTRAVENOUS
  Filled 2018-12-20: qty 10

## 2018-12-20 MED ORDER — HEPARIN SOD (PORK) LOCK FLUSH 100 UNIT/ML IV SOLN
500.0000 [IU] | Freq: Once | INTRAVENOUS | Status: AC
  Administered 2018-12-20: 500 [IU] via INTRAVENOUS

## 2018-12-20 MED ORDER — SODIUM CHLORIDE 0.9 % IV SOLN
Freq: Once | INTRAVENOUS | Status: AC
  Administered 2018-12-20: 11:00:00 via INTRAVENOUS
  Filled 2018-12-20: qty 250

## 2018-12-20 MED ORDER — SODIUM CHLORIDE 0.9 % IV SOLN
200.0000 mg | Freq: Once | INTRAVENOUS | Status: AC
  Administered 2018-12-20: 200 mg via INTRAVENOUS
  Filled 2018-12-20: qty 8

## 2018-12-20 NOTE — Patient Instructions (Signed)
Pembrolizumab injection  What is this medicine?  PEMBROLIZUMAB (pem broe liz ue mab) is a monoclonal antibody. It is used to treat cervical cancer, esophageal cancer, head and neck cancer, hepatocellular cancer, Hodgkin lymphoma, kidney cancer, lymphoma, melanoma, Merkel cell carcinoma, lung cancer, stomach cancer, urothelial cancer, and cancers that have a certain genetic condition.  This medicine may be used for other purposes; ask your health care provider or pharmacist if you have questions.  COMMON BRAND NAME(S): Keytruda  What should I tell my health care provider before I take this medicine?  They need to know if you have any of these conditions:  -diabetes  -immune system problems  -inflammatory bowel disease  -liver disease  -lung or breathing disease  -lupus  -received or scheduled to receive an organ transplant or a stem-cell transplant that uses donor stem cells  -an unusual or allergic reaction to pembrolizumab, other medicines, foods, dyes, or preservatives  -pregnant or trying to get pregnant  -breast-feeding  How should I use this medicine?  This medicine is for infusion into a vein. It is given by a health care professional in a hospital or clinic setting.  A special MedGuide will be given to you before each treatment. Be sure to read this information carefully each time.  Talk to your pediatrician regarding the use of this medicine in children. While this drug may be prescribed for selected conditions, precautions do apply.  Overdosage: If you think you have taken too much of this medicine contact a poison control center or emergency room at once.  NOTE: This medicine is only for you. Do not share this medicine with others.  What if I miss a dose?  It is important not to miss your dose. Call your doctor or health care professional if you are unable to keep an appointment.  What may interact with this medicine?  Interactions have not been studied.  Give your health care provider a list of all the  medicines, herbs, non-prescription drugs, or dietary supplements you use. Also tell them if you smoke, drink alcohol, or use illegal drugs. Some items may interact with your medicine.  This list may not describe all possible interactions. Give your health care provider a list of all the medicines, herbs, non-prescription drugs, or dietary supplements you use. Also tell them if you smoke, drink alcohol, or use illegal drugs. Some items may interact with your medicine.  What should I watch for while using this medicine?  Your condition will be monitored carefully while you are receiving this medicine.  You may need blood work done while you are taking this medicine.  Do not become pregnant while taking this medicine or for 4 months after stopping it. Women should inform their doctor if they wish to become pregnant or think they might be pregnant. There is a potential for serious side effects to an unborn child. Talk to your health care professional or pharmacist for more information. Do not breast-feed an infant while taking this medicine or for 4 months after the last dose.  What side effects may I notice from receiving this medicine?  Side effects that you should report to your doctor or health care professional as soon as possible:  -allergic reactions like skin rash, itching or hives, swelling of the face, lips, or tongue  -bloody or black, tarry  -breathing problems  -changes in vision  -chest pain  -chills  -confusion  -constipation  -cough  -diarrhea  -dizziness or feeling faint or lightheaded  -  fast or irregular heartbeat  -fever  -flushing  -hair loss  -joint pain  -low blood counts - this medicine may decrease the number of white blood cells, red blood cells and platelets. You may be at increased risk for infections and bleeding.  -muscle pain  -muscle weakness  -persistent headache  -redness, blistering, peeling or loosening of the skin, including inside the mouth  -signs and symptoms of high blood sugar  such as dizziness; dry mouth; dry skin; fruity breath; nausea; stomach pain; increased hunger or thirst; increased urination  -signs and symptoms of kidney injury like trouble passing urine or change in the amount of urine  -signs and symptoms of liver injury like dark urine, light-colored stools, loss of appetite, nausea, right upper belly pain, yellowing of the eyes or skin  -sweating  -swollen lymph nodes  -weight loss  Side effects that usually do not require medical attention (report to your doctor or health care professional if they continue or are bothersome):  -decreased appetite  -muscle pain  -tiredness  This list may not describe all possible side effects. Call your doctor for medical advice about side effects. You may report side effects to FDA at 1-800-FDA-1088.  Where should I keep my medicine?  This drug is given in a hospital or clinic and will not be stored at home.  NOTE: This sheet is a summary. It may not cover all possible information. If you have questions about this medicine, talk to your doctor, pharmacist, or health care provider.   2019 [employer]vier/Gold Standard (2018-02-18 15:06:10)

## 2018-12-20 NOTE — Progress Notes (Signed)
No new changes noted today 

## 2018-12-21 ENCOUNTER — Telehealth: Payer: Self-pay

## 2018-12-21 LAB — T4: T4, Total: 10.1 ug/dL (ref 4.5–12.0)

## 2018-12-21 NOTE — Telephone Encounter (Signed)
Telephone call to schedule telephonic visit for patient.  Patient in agreement with RN making telephonic visit on Tuesday 12/28/18 at 2:30 PM.

## 2018-12-28 ENCOUNTER — Other Ambulatory Visit: Payer: Medicare Other

## 2018-12-28 ENCOUNTER — Other Ambulatory Visit: Payer: Self-pay

## 2018-12-28 DIAGNOSIS — Z515 Encounter for palliative care: Secondary | ICD-10-CM

## 2018-12-28 NOTE — Progress Notes (Signed)
PATIENT NAME: Jimmy West DOB: 1950/11/01 MRN: [id number]  PRIMARY CARE PROVIDER: [name], NP  RESPONSIBLE PARTY:  Acct ID - Guarantor Home Phone Work Phone Relationship Acct Type  [id number] LAYLA, [phone]  Self P/F     [address], [place], [place] [address]    PLAN OF CARE and INTERVENTIONS:               1.  GOALS OF CARE/ ADVANCE CARE PLANNING:  Remain independent with symptoms managed.               2.  PATIENT/CAREGIVER EDUCATION: on going education on s/s of infection, support                3.  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   Due to the COVID-19 crisis, this visit was done via telephonic from my office and it was initiated and consent by this patient and or family. Patient reports he is "doing pretty good."  Patient reports he has returned to work currently working 4 days per week for 4-5 hours per day.  Patient reports he continues to have pain in his back.  Patient reports pain is worse with bending over which he has to do frequently when working.  Patient currently on Fentanyl patch 75 mcg's and Oxycodone 10 mg prn.  Patient states current pain meds are effective if he takes Oxycodone and doesn't let pain get out of control.  Education to patient to take pain meds as directed to keep pain in control.  Patient reports his weight is unchanged.  Patient continues to receive immunotherapy every 3 weeks.  Patient reports he doesn't have "much of a appetite" however he states He 'has never been a big eater."  Patient denies suffering any falls.  Patient denies having any cough or shortness of breath.  Patient denies having any swelling in lower extremities.  Patient reports he has been "sleeping alright" at night.  Patient states he feels he is doing as well as can be expected at the present time.  Patient denies having any needs or concerns.  Patient informed to call with questions or concerns.            HISTORY OF PRESENT ILLNESS:    CODE STATUS: Full Code  ADVANCED DIRECTIVES: No MOST FORM: No PPS: 70%   PHYSICAL EXAM:   VITALS: Today's Vitals   12/28/18 1448  PainSc: 4   PainLoc: Back    LUNGS: denies shortness of breath or cough CARDIAC: EXTREMITIES: none edema SKIN: Skin color, texture, turgor normal. No rashes or lesions  NEURO: negative alert and oriented x 4       Nilda Simmer, RN

## 2018-12-31 ENCOUNTER — Other Ambulatory Visit: Payer: Self-pay | Admitting: *Deleted

## 2018-12-31 MED ORDER — OXYCODONE HCL 10 MG PO TABS: 10.0000 mg | ORAL_TABLET | ORAL | 0 refills | Status: DC | PRN

## 2019-01-05 ENCOUNTER — Telehealth: Payer: Self-pay

## 2019-01-05 NOTE — Telephone Encounter (Signed)
Spoke with patient regarding continued back pain with numbness and tingling that has been going on for "awhile". Denies any worsening of symptoms than usual. States he was trying to push off surgery but is "afraid he going to have to just do it". Patient is concerned regarding finances from the hospital and for the surgery. Reports he is having a hard time paying current bills through the hospital and does not want to endure more financial hardship. Informed patient I would reach out to [facility]) to see what options may be available. Advised patient if he needs anything before hearing back, to reach out to the office. Patient verbalizes understanding and denies any other questions.

## 2019-01-05 NOTE — Telephone Encounter (Signed)
Left VM informing patient that [facility] will be in contact with him this week. Number provided should any questions arise.

## 2019-01-06 DIAGNOSIS — C7951 Secondary malignant neoplasm of bone: Secondary | ICD-10-CM | POA: Diagnosis not present

## 2019-01-07 ENCOUNTER — Other Ambulatory Visit: Payer: Self-pay

## 2019-01-10 ENCOUNTER — Other Ambulatory Visit: Payer: Self-pay

## 2019-01-10 ENCOUNTER — Encounter: Payer: Self-pay | Admitting: Hematology and Oncology

## 2019-01-10 ENCOUNTER — Inpatient Hospital Stay: Payer: Medicare Other

## 2019-01-10 ENCOUNTER — Inpatient Hospital Stay (HOSPITAL_BASED_OUTPATIENT_CLINIC_OR_DEPARTMENT_OTHER): Payer: Medicare Other | Admitting: Hematology and Oncology

## 2019-01-10 ENCOUNTER — Other Ambulatory Visit: Payer: Self-pay | Admitting: *Deleted

## 2019-01-10 VITALS — BP 114/74 | HR 59 | Temp 97.1°F | Resp 16

## 2019-01-10 VITALS — BP 125/71 | HR 74 | Temp 98.1°F | Resp 16 | Wt 153.0 lb

## 2019-01-10 DIAGNOSIS — C3431 Malignant neoplasm of lower lobe, right bronchus or lung: Secondary | ICD-10-CM

## 2019-01-10 DIAGNOSIS — Z5112 Encounter for antineoplastic immunotherapy: Secondary | ICD-10-CM

## 2019-01-10 DIAGNOSIS — J90 Pleural effusion, not elsewhere classified: Secondary | ICD-10-CM | POA: Diagnosis not present

## 2019-01-10 DIAGNOSIS — R17 Unspecified jaundice: Secondary | ICD-10-CM

## 2019-01-10 DIAGNOSIS — G893 Neoplasm related pain (acute) (chronic): Secondary | ICD-10-CM | POA: Diagnosis not present

## 2019-01-10 DIAGNOSIS — C7951 Secondary malignant neoplasm of bone: Secondary | ICD-10-CM

## 2019-01-10 DIAGNOSIS — M8440XA Pathological fracture, unspecified site, initial encounter for fracture: Secondary | ICD-10-CM | POA: Diagnosis not present

## 2019-01-10 DIAGNOSIS — R918 Other nonspecific abnormal finding of lung field: Secondary | ICD-10-CM | POA: Diagnosis not present

## 2019-01-10 DIAGNOSIS — M47812 Spondylosis without myelopathy or radiculopathy, cervical region: Secondary | ICD-10-CM | POA: Diagnosis not present

## 2019-01-10 DIAGNOSIS — Z923 Personal history of irradiation: Secondary | ICD-10-CM | POA: Diagnosis not present

## 2019-01-10 DIAGNOSIS — C801 Malignant (primary) neoplasm, unspecified: Secondary | ICD-10-CM | POA: Diagnosis not present

## 2019-01-10 LAB — TSH: TSH: 1.039 u[IU]/mL (ref 0.350–4.500)

## 2019-01-10 LAB — CBC WITH DIFFERENTIAL/PLATELET
Abs Immature Granulocytes: 0.02 10*3/uL (ref 0.00–0.07)
Basophils Absolute: 0.1 10*3/uL (ref 0.0–0.1)
Basophils Relative: 1 %
Eosinophils Absolute: 0.6 10*3/uL — ABNORMAL HIGH (ref 0.0–0.5)
Eosinophils Relative: 6 %
HCT: 40 % (ref 39.0–52.0)
Hemoglobin: 13.8 g/dL (ref 13.0–17.0)
Immature Granulocytes: 0 %
Lymphocytes Relative: 9 %
Lymphs Abs: 0.8 10*3/uL (ref 0.7–4.0)
MCH: 30.1 pg (ref 26.0–34.0)
MCHC: 34.5 g/dL (ref 30.0–36.0)
MCV: 87.3 fL (ref 80.0–100.0)
Monocytes Absolute: 0.7 10*3/uL (ref 0.1–1.0)
Monocytes Relative: 8 %
Neutro Abs: 6.8 10*3/uL (ref 1.7–7.7)
Neutrophils Relative %: 76 %
Platelets: 240 10*3/uL (ref 150–400)
RBC: 4.58 MIL/uL (ref 4.22–5.81)
RDW: 14.1 % (ref 11.5–15.5)
WBC: 8.9 10*3/uL (ref 4.0–10.5)
nRBC: 0 % (ref 0.0–0.2)

## 2019-01-10 LAB — COMPREHENSIVE METABOLIC PANEL
ALT: 15 U/L (ref 0–44)
AST: 21 U/L (ref 15–41)
Albumin: 4.1 g/dL (ref 3.5–5.0)
Alkaline Phosphatase: 68 U/L (ref 38–126)
Anion gap: 9 (ref 5–15)
BUN: 10 mg/dL (ref 8–23)
CO2: 23 mmol/L (ref 22–32)
Calcium: 9.1 mg/dL (ref 8.9–10.3)
Chloride: 103 mmol/L (ref 98–111)
Creatinine, Ser: 0.95 mg/dL (ref 0.61–1.24)
GFR calc Af Amer: >60 mL/min (ref >60)
GFR calc non Af Amer: >60 mL/min (ref >60)
Glucose, Bld: 101 mg/dL — ABNORMAL HIGH (ref 70–99)
Potassium: 4.2 mmol/L (ref 3.5–5.1)
Sodium: 135 mmol/L (ref 135–145)
Total Bilirubin: 1.3 mg/dL — ABNORMAL HIGH (ref 0.3–1.2)
Total Protein: 7.2 g/dL (ref 6.5–8.1)

## 2019-01-10 LAB — BILIRUBIN, DIRECT: Bilirubin, Direct: <0.1 mg/dL (ref 0.0–0.2)

## 2019-01-10 MED ORDER — SODIUM CHLORIDE 0.9 % IV SOLN
Freq: Once | INTRAVENOUS | Status: AC
  Administered 2019-01-10: 10:00:00 via INTRAVENOUS
  Filled 2019-01-10: qty 250

## 2019-01-10 MED ORDER — FENTANYL 75 MCG/HR TD PT72: 1.0000 | MEDICATED_PATCH | TRANSDERMAL | 0 refills | Status: DC

## 2019-01-10 MED ORDER — DENOSUMAB 120 MG/1.7ML ~~LOC~~ SOLN
120.0000 mg | Freq: Once | SUBCUTANEOUS | Status: AC
  Administered 2019-01-10: 120 mg via SUBCUTANEOUS

## 2019-01-10 MED ORDER — HEPARIN SOD (PORK) LOCK FLUSH 100 UNIT/ML IV SOLN
500.0000 [IU] | Freq: Once | INTRAVENOUS | Status: AC
  Administered 2019-01-10: 500 [IU] via INTRAVENOUS
  Filled 2019-01-10: qty 5

## 2019-01-10 MED ORDER — SODIUM CHLORIDE 0.9% FLUSH
10.0000 mL | INTRAVENOUS | Status: DC | PRN
  Administered 2019-01-10: 09:00:00 10 mL via INTRAVENOUS
  Filled 2019-01-10: qty 10

## 2019-01-10 MED ORDER — SODIUM CHLORIDE 0.9 % IV SOLN
200.0000 mg | Freq: Once | INTRAVENOUS | Status: AC
  Administered 2019-01-10: 200 mg via INTRAVENOUS
  Filled 2019-01-10: qty 8

## 2019-01-10 NOTE — Progress Notes (Signed)
[facility]  [address] [place], [place] [address] Phone: [phone]  Fax: [phone]   Clinic Day:  01/10/2019  Referring physician: [name], NP  Chief Complaint: Jimmy West is a 68 y.o. male with with metastatic adenocarcinoma of the lung who is seen for assessment prior to cycle #11pembrolizumab.  HPI: The patient was last seen in the medical oncology clinic on 12/20/2018. At that time, he was "pretty good." His back pain had improved secondary to working less. He denied any numbness of tingling in his legs.  He received cycle #10pembrolizumab.   He was seen by palliative care via telemedicine on 12/28/2018.  He continued his current regimen for back pain.   He contacted the clinic about worsening back pain on 01/05/2019.  He wants to undergo surgery, but is very concerned about the financial burden; he is having difficulty paying his bills.  He was referred to [facility] in social work.   During the interim, the patient he is doing pretty good. He reports working 4-5 hours daily.  He has mild back pain from constantly bending over at work. He reports being able to play golf and has had no back pain this week.  While playing golf, he slipped up on mud and fell. He says his elbow is sore but denies back pain after  falling.   Last week he had numbness in his back and today.  He has an appointment for a cervial and thoracic spine MRI today.  He reports taking four to six 10 mg oxycodone weekly. His weight is down 4 lbs.   Past Medical History:  Diagnosis Date  . Bulging lumbar disc   . Cancer ([place])   . Heart attack [facility])     Past Surgical History:  Procedure Laterality Date  . CARDIAC CATHETERIZATION     two stents  . KNEE SURGERY Left   . PORTA CATH INSERTION N/A 05/21/2018   Procedure: PORTA CATH INSERTION;  Surgeon: Algernon Huxley, MD;  Location: [facility];  Service: Cardiovascular;  Laterality: N/A;    Family History  Problem Relation Age of Onset  . Heart failure Father   . Cancer Maternal Aunt   . Heart failure Maternal Uncle   . Dementia Paternal Grandmother   . Cancer Maternal Aunt     Social History:  reports that he quit smoking about 15 years ago. His smoking use included cigarettes. He has a 22.50 pack-year smoking history. He has never used smokeless tobacco. He reports current alcohol use of about 15.0 standard drinks of alcohol per week. He reports that he does not use drugs. He started smoking at age 31. He is smoking 1 1/2 packs/day. 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 Patient denies known exposures to radiation on toxins.The patient is alone today.  Allergies: No Known Allergies  Current Medications: Current Outpatient Medications  Medication Sig Dispense Refill  . acetaminophen (TYLENOL) 500 MG tablet Take 500 mg by mouth 3 (three) times daily as needed.    Marland Kitchen aspirin EC 81 MG tablet Take 81 mg by mouth daily.    . Calcium 600-400 MG-UNIT CHEW Chew 2 tablets by mouth daily.    . fentaNYL (DURAGESIC) 75 MCG/HR Place 1 patch onto the skin every 3 (three)  days. 10 patch 0  . FLUoxetine (PROZAC) 20 MG capsule Take 1 capsule (20 mg total) by mouth daily. 30 capsule 3  . ibuprofen (ADVIL,MOTRIN) 200 MG tablet Take 200 mg by mouth every 6 (six) hours as needed.    . lidocaine-prilocaine (EMLA) cream Apply to affected area once 30 g 3  . Oxycodone HCl 10 MG TABS Take 1-2 tablets (10-20 mg total) by mouth every 4 (four) hours as needed. 90 tablet 0  . pembrolizumab (KEYTRUDA) 100 MG/4ML SOLN Inject 2 mg/kg into the vein every 30 (thirty) days.     . polyethylene glycol (MIRALAX / GLYCOLAX) packet Take 17 g by mouth daily.    Marland Kitchen senna (SENOKOT) 8.6 MG tablet Take 1 tablet by mouth as needed.      . naloxone (NARCAN) nasal spray 4 mg/0.1 mL 1 spray by nose in the event of opioid overdose. Call EMS (911) immediately if medication is used. (Patient not taking: Reported on 10/13/2018) 1 kit 0  . ondansetron (ZOFRAN) 8 MG tablet Take 1 tablet (8 mg total) by mouth 2 (two) times daily as needed (Nausea or vomiting). Start if needed on the third day after chemotherapy. (Patient not taking: Reported on 10/13/2018) 30 tablet 1   No current facility-administered medications for this visit.    Facility-Administered Medications Ordered in Other Visits  Medication Dose Route Frequency Provider Last Rate Last Dose  . heparin lock flush 100 unit/mL  500 Units Intravenous Once ,  C, MD      . sodium chloride flush (NS) 0.9 % injection 10 mL  10 mL Intravenous PRN Lequita Asal, MD   10 mL at 01/10/19 0920    Review of Systems  Constitutional: Positive for weight loss (down 4 lbs). Negative for chills, diaphoresis, fever and malaise/fatigue.       Doing "pretty good".  "No complaints".  HENT: Negative.  Negative for congestion, ear pain, nosebleeds, sinus pain and sore throat.   Eyes: Negative.  Negative for blurred vision, double vision, photophobia and pain.  Respiratory: Negative.  Negative for cough, hemoptysis, sputum production, shortness of breath and wheezing.   Cardiovascular: Negative.  Negative for chest pain, palpitations, orthopnea, leg swelling and PND.  Gastrointestinal: Negative.  Negative for abdominal pain, blood in stool, constipation, diarrhea, heartburn, melena, nausea and vomiting.  Genitourinary: Negative.  Negative for dysuria, frequency, hematuria and urgency.  Musculoskeletal: Positive for back pain (well controlled). Negative for falls, joint pain, myalgias and neck pain.  Skin: Negative.  Negative for itching and rash.  Neurological: Positive for tingling (in mid back, occasional) and sensory change (intermittent numbness in mid back; chronic numbness to  LEFT heel 2/2 disc issues). Negative for dizziness, tremors, speech change, focal weakness, weakness and headaches.  Endo/Heme/Allergies: Negative.  Does not bruise/bleed easily.  Psychiatric/Behavioral: Negative.  Negative for depression and memory loss. The patient is not nervous/anxious and does not have insomnia.        Feels "not motivated".  All other systems reviewed and are negative.  Performance status (ECOG):  1  Physical Exam  Constitutional: He is oriented to person, place, and time. No distress.  Thin gentleman sitting comfortably in the exam room in no acute distress.  HENT:  Head: Normocephalic and atraumatic.  Mouth/Throat: Oropharynx is clear and moist. He has dentures. No oropharyngeal exudate.  Brown hair with slight graying.  Mustache.  Dentures.  Wearing a mask.    Eyes: Pupils are equal, round, and reactive to light. Conjunctivae and  EOM are normal. No scleral icterus.  Gray blue eyes.  Neck: Normal range of motion. Neck supple. No JVD present.  Cardiovascular: Normal rate, regular rhythm and normal heart sounds. Exam reveals no gallop and no friction rub.  No murmur heard. Pulmonary/Chest: Effort normal and breath sounds normal. No respiratory distress. He has no wheezes. He has no rales. He exhibits no tenderness.  Abdominal: Soft. Bowel sounds are normal. He exhibits no distension and no mass. There is no abdominal tenderness. There is no rebound and no guarding.  Musculoskeletal: Normal range of motion.        General: No tenderness or edema.     Thoracic back: Tenderness: left T4 paravertebral muscle slightly uncomfortable to palpation.  Lymphadenopathy:    He has no cervical adenopathy.    He has no axillary adenopathy.       Right: No supraclavicular adenopathy present.       Left: No supraclavicular adenopathy present.  Neurological: He is alert and oriented to person, place, and time.  Skin: Skin is warm and dry. No rash noted. He is not diaphoretic. No  erythema.  Right elbow scrape s/p fall.  Psychiatric: He has a normal mood and affect. His behavior is normal. Judgment and thought content normal.  Nursing note reviewed.   Imaging studies: 04/27/2018:Chest CT angiogramrevealed no evidence of significant pulmonary embolus. There was a 4.1 cm mass in the superior segment right lower lobe posteriorly causing obliteration of the in coming bronchus and mild postobstructive change. There were multiple additional bilateral pulmonary nodules (<1 cm - 1.4 cm). There were enlarged right hilar lymph nodes. There was metastasis to the T4 vertebra with mild associated compression. There was emphysematous changes in the lungs. 04/30/2018:PET scanrevealed a 3.4 cm hypermetabolic RLL pulmonary mass (SUV 13.2), 11 mm pulmonary nodule in the LEFT lung (SUV 4.5), RIGHT paratracheal lymph node (SUV 5.1), and hypermetabolic activity within the T4 vertebral body (SUV 10.2).  05/03/2018:Thoracic spine MRIrevealed T4 metastasis with a 40% pathologic compression deformity and 5 mm of retropulsion of the vertebral body. Retropulsion results in mild spinal canal stenosis and mild bilateral C4-5 foraminal stenosis. There was paravertebral soft tissue thickening from mid T3 to mid T5, likely representing edema related to the pathologic compression deformity vs. possible extraosseous extension of the neoplasm. There were no additional thoracic spinal metastases noted.  05/03/2018:Head MRIrevealed no intracranial metastatic disease. 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 Incidental mention made of mild paranasal sinus disease. 08/17/2018:Thoracic spine CT at [facility] on 08/17/2018 revealedpathologic compression fracture of T4resulting in vertebra plana. Fracture fragments protrude into  the spinal canal resulting in moderate to severe canal narrowingat T4. There was severe right and moderate left foraminal narrowing at T4-T5 secondary to vertebra plana. There were multiple spiculated pulmonary masses consistent with metastatic adenocarcinoma, similar in appearance to 04/30/2018 PET/CT.  08/26/2018:Chest CTrevealed a 3.1 cm right lower lobe mass, corresponding to known primary bronchogenic neoplasm, mildly decreased. Bilateral pulmonary nodules/metastases, most of which are improved. There was mildly progressive pleural-based nodularity in the right upper lobe along the minor fissure, including a dominant 17 x 11 mm nodule. There was a small right pleural effusion, decreased. There was a 15 mm left adrenal metastasis, decreased.  10/29/2018:[facility] CTat [facility] revealedunchanged severe compression deformity/vertebral vertebra plana of T4. There was increased sclerotic changes within the collapsed vertebral  body possibly due to interval radiation. There was a stable degree of retropulsion and focal mild spinal canal stenosis at T4. There was interval enlargement of the left and right upper lobe spiculated pulmonary nodules and right lower lobe mass.  11/02/2018:Chest, abdomen, and pelvic CTrevealed the dominant right lower lobe mass has slightly enlarged compared with the most recent CT of 2 months ago, although demonstrated central necrosis. Current size was more similar to that demonstrated on the PET-CT. Additional smaller pulmonary nodules were stable to slightly improved. No definite new nodules. Small right hilar and mediastinal lymph nodes were stable. There was persistent pleural thickening and small loculated right pleural effusion. There was stable pathologic fracture at T4. No new osseous metastases or fractures identified. There was no evidence of metastatic disease in the abdomen or pelvis. Images were reviewed with Dr Kathlene Cote. Lung lesions  weresmaller. There was central necrosis in the RLL lesion. There wasno evidence of progressive disease.   Infusion on 01/10/2019  Component Date Value Ref Range Status  . WBC 01/10/2019 8.9  4.0 - 10.5 K/uL Final  . RBC 01/10/2019 4.58  4.22 - 5.81 MIL/uL Final  . Hemoglobin 01/10/2019 13.8  13.0 - 17.0 g/dL Final  . HCT 01/10/2019 40.0  39.0 - 52.0 % Final  . MCV 01/10/2019 87.3  80.0 - 100.0 fL Final  . MCH 01/10/2019 30.1  26.0 - 34.0 pg Final  . MCHC 01/10/2019 34.5  30.0 - 36.0 g/dL Final  . RDW 01/10/2019 14.1  11.5 - 15.5 % Final  . Platelets 01/10/2019 240  150 - 400 K/uL Final  . nRBC 01/10/2019 0.0  0.0 - 0.2 % Final  . Neutrophils Relative % 01/10/2019 76  % Final  . Neutro Abs 01/10/2019 6.8  1.7 - 7.7 K/uL Final  . Lymphocytes Relative 01/10/2019 9  % Final  . Lymphs Abs 01/10/2019 0.8  0.7 - 4.0 K/uL Final  . Monocytes Relative 01/10/2019 8  % Final  . Monocytes Absolute 01/10/2019 0.7  0.1 - 1.0 K/uL Final  . Eosinophils Relative 01/10/2019 6  % Final  . Eosinophils Absolute 01/10/2019 0.6* 0.0 - 0.5 K/uL Final  . Basophils Relative 01/10/2019 1  % Final  . Basophils Absolute 01/10/2019 0.1  0.0 - 0.1 K/uL Final  . Immature Granulocytes 01/10/2019 0  % Final  . Abs Immature Granulocytes 01/10/2019 0.02  0.00 - 0.07 K/uL Final   Performed at [facility], [address], [place], [place] [address]    Assessment:  Jimmy West is a 68 y.o. male with metastatichigh-grade adenocarcinomaof the right lungs/p CT-guided biopsy of a RLL lung mass on 05/11/2018. Pathologyrevealed an invasive high-grade adenocarcinoma, with predominantly solid growth pattern. The neoplastic cells were TTF-1 (+), Napsin A (+), and P40 (+). He has a T4 vertebral metastasis. Clinical stage is T4N1M1.  There was not enough material for [facility] testing. PDL-1revealed TPS 90%.  PET scanon 04/30/2018 revealed a 3.4 cm hypermetabolic RLL pulmonary mass (SUV  13.2), 11 mm pulmonary nodule in the LEFT lung (SUV 4.5), RIGHT paratracheal lymph node (SUV 5.1), and hypermetabolic activity within the T4 vertebral body (SUV 10.2).   Thoracic spine MRIon 05/03/2018 revealed T4 metastasiswith a 40% pathologic compression deformity and 5 mm of retropulsion of the vertebral body. Retropulsion results in mild spinal canal stenosis and mild bilateral C4-5 foraminal stenosis. There was paravertebral soft tissue thickening from mid T3 to mid T5, likely representing edema related to the pathologic compression deformity vs. possible  extraosseous extension of the neoplasm. There were no additional thoracic spinal metastases noted.   Head MRIon 05/03/2018 revealed no intracranial metastatic disease. 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 Incidental mention made of mild paranasal sinus disease.  He is s/p cycle #10pembrolizumab(05/24/2018 - 11/29/2018). He is tolerating treatment well. CEAwas 1.3 on 08/30/2018.LDHwas 165 on 11/29/2018.  He completed T4 radiationon 07/07/2018. He receives Xgevamonthly (06/03/2018 - 11/29/2018).  He hascancer-related painin T4. He is currently taking Fentanyl75 mcg/hr and oxycodone10 mg every 4 hours prn   [facility] CTat [facility] on 10/29/2018 revealedunchanged severe compression deformity/vertebral vertebra plana of T4. There was increased sclerotic changes within the collapsed vertebral body possibly due to interval radiation. There was a stable degree of retropulsion and focal mild spinal canal stenosis at T4. There was interval enlargement of the left and right upper lobe spiculated pulmonary nodules and right lower lobe mass.   Chest, abdomen, and pelvic CTon 11/02/2018 revealed the dominant right lower lobe mass has slightly  enlarged compared with the most recent CT of 2 months ago, although demonstrated central necrosis. Current size was more similar to that demonstrated on the PET-CT. Additional smaller pulmonary nodules were stable to slightly improved. No definite new nodules. Small right hilar and mediastinal lymph nodes were stable. There was persistent pleural thickening and small loculated right pleural effusion. There was stable pathologic fracture at T4. No new osseous metastases or fractures identified. There was no evidence of metastatic disease in the abdomen or pelvis. Images were reviewed with Dr Kathlene Cote. Lesions weresmaller. There was central necrosis in the RLL lesion. There wasno evidence of progressive disease.  Symptomatically, he is doing well.  He denies any respiratory symptoms.  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  Exam is stable.  Bilirubin 1.3.  Plan: 1.   Labs today:CBC with diff, CMP, TSH. 2.   Metastatic high-grade adenocarcinoma the RIGHT lung Clinically he continues to do well. Labs reviewed.  Discuss plan for cycle #11 pembrolizumab today. Discuss plan for restaging imaging prior to next cycle Discuss symptom management.  He has antiemetics and pain medications at home to use on a prn bases.  Interventions are adequate.     3.   T4 osseous metastasis Patient has follow-up cervical and thoracic MRI today. Patient followed by [facility] neurosurgery for consideration of surgical management. Patient receives [place] monthly (due today). Continue calcium 1200 mg and vitamin D 800 IU daily. 4.   Cancer-related pain Pain appears fairly well controlled as he is now working and Marketing executive.  Continue Fentanyl 75 mcg/hour and oxycodone 10 mg every 4-6 hours as needed pain. 5. Elevated bilirubin   Check direct bilirubin.  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 6.   Chest, abdomen and pelvic CT on 01/27/2019.  7.   RTC in 3 weeks for MD assessment, labs (CBC with diff, CMP, TSH), and cycle #12 pembrolizumab.  I discussed the assessment and treatment plan with the patient.  The patient was provided an opportunity to ask questions and all were answered.  The patient agreed with the plan and demonstrated an understanding of the instructions.  The patient was advised to call back if the symptoms worsen or if the condition fails to improve as anticipated.   Lequita Asal, MD, PhD    01/10/2019,  9:45 AM  I, Selena Batten, am acting as scribe for [employer]. Mike Gip, MD, PhD.  I,  C. Mike Gip, MD, have reviewed the above documentation for accuracy and completeness, and I agree with the above.

## 2019-01-10 NOTE — Progress Notes (Signed)
Patient her for follow up. Denies any concerns.

## 2019-01-11 LAB — T4: T4, Total: 9.4 ug/dL (ref 4.5–12.0)

## 2019-01-13 ENCOUNTER — Other Ambulatory Visit: Payer: Self-pay | Admitting: Hospice and Palliative Medicine

## 2019-01-13 ENCOUNTER — Other Ambulatory Visit: Payer: Self-pay | Admitting: *Deleted

## 2019-01-13 MED ORDER — OXYCODONE HCL 20 MG PO TABS: 0.5000 | ORAL_TABLET | ORAL | 0 refills | Status: DC | PRN

## 2019-01-13 NOTE — Telephone Encounter (Signed)
Patient consistently taking two 10mg  oxycodone tablets. Will increase dose to 20mg  to reduce pill burden.

## 2019-01-18 ENCOUNTER — Other Ambulatory Visit: Payer: Medicare Other

## 2019-01-18 ENCOUNTER — Ambulatory Visit: Payer: Medicare Other | Admitting: Hematology and Oncology

## 2019-01-18 ENCOUNTER — Ambulatory Visit: Payer: Medicare Other

## 2019-01-25 ENCOUNTER — Other Ambulatory Visit: Payer: Self-pay

## 2019-01-25 ENCOUNTER — Other Ambulatory Visit: Payer: Medicare Other

## 2019-01-25 ENCOUNTER — Telehealth: Payer: Self-pay

## 2019-01-25 DIAGNOSIS — Z515 Encounter for palliative care: Secondary | ICD-10-CM

## 2019-01-25 NOTE — Telephone Encounter (Signed)
Telephone call to patient to schedule TELEHEALTH visit with patient. Patient in agreement with TELEHEALTH visit on 01-25-19 at 3:30 PM.

## 2019-01-25 NOTE — Progress Notes (Signed)
COMMUNITY PALLIATIVE CARE SW NOTE  PATIENT NAME: Jimmy West DOB: 03/26/1951 MRN: [id number]  PRIMARY CARE PROVIDER: [name], NP  RESPONSIBLE PARTY:  Acct ID - Guarantor Home Phone Work Phone Relationship Acct Type  [id number] AMAN, [phone]  Self P/F     [address], [place],  [address]     PLAN OF CARE and INTERVENTIONS:             1. GOALS OF CARE/ ADVANCE CARE PLANNING:  Patient is a full code. Patient has not completed advance care planning. Discussion to continue. 2. SOCIAL/EMOTIONAL/SPIRITUAL ASSESSMENT/ INTERVENTIONS:  SW completed TELEHEALTH visit with patient. Patient reports that he is doing "pretty good". Patient reports continued back pain, especially upper back. Patient rates pain at 1-2 right now but said that it does get worse when he is working or leaning over. Patient continues to wear Fentanyl patch and take Oxycodone PRN. Patient reports that he is sleeping well. Patient said he continues to just "snack" during the day but tries to eat one good meal. Patient does drink Boost. Patient said he tries to eat protein and has talked with a nutritionist.  3. PATIENT/CAREGIVER EDUCATION/ COPING:  Patient noted that his mood seems better since returning to work. Patient reports feeling "good" at this time. Denies any anxiety.  4. PERSONAL EMERGENCY PLAN:  Patient will call 9-1-1 for emergencies.  5. COMMUNITY RESOURCES COORDINATION/ HEALTH CARE NAVIGATION:  Patient is able to coordinate his medical care, denies concerns. Patient is scheduled for a CT scan on Friday and has another immunotherapy visit on Monday. Patient is expecting to discuss CT results at the appointment on Monday.  6. 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 Patient feels he is "okay" right now but does have concern for future. SW to follow.      SOCIAL HX:  Social History   Tobacco  Use  . Smoking status: Former Smoker    Packs/day: 1.50    Years: 15.00    Pack years: 22.50    Types: Cigarettes    Quit date: 11/03/2003    Years since quitting: 15.2  . Smokeless tobacco: Never Used  Substance Use Topics  . Alcohol use: Yes    Alcohol/week: 15.0 standard drinks    Types: 15 Shots of liquor per week    Comment: occasionally    CODE STATUS:   Code Status: Not on file  ADVANCED DIRECTIVES: N MOST FORM COMPLETE:  No. HOSPICE EDUCATION PROVIDED: None.  PPS: Patient is independent of ADLs.  Due to the COVID-19 crisis, this visit was done viatelephonefrom my office and it was initiated and consent by this patient and/or family.This was a scheduled visit.  I spent62minutes with patient/family, from 3:30-4:00pproviding education, support and consultation.  Margaretmary Lombard, LCSW

## 2019-01-28 ENCOUNTER — Other Ambulatory Visit: Payer: Self-pay

## 2019-01-28 ENCOUNTER — Ambulatory Visit
Admission: RE | Admit: 2019-01-28 | Discharge: 2019-01-28 | Disposition: A | Discharge status: Home / Self Care | Payer: Medicare Other | Source: Ambulatory Visit | Attending: Hematology and Oncology | Admitting: Hematology and Oncology

## 2019-01-28 DIAGNOSIS — C7951 Secondary malignant neoplasm of bone: Secondary | ICD-10-CM | POA: Diagnosis not present

## 2019-01-28 DIAGNOSIS — D3502 Benign neoplasm of left adrenal gland: Secondary | ICD-10-CM | POA: Diagnosis not present

## 2019-01-28 DIAGNOSIS — C3431 Malignant neoplasm of lower lobe, right bronchus or lung: Secondary | ICD-10-CM | POA: Diagnosis not present

## 2019-01-28 DIAGNOSIS — C3491 Malignant neoplasm of unspecified part of right bronchus or lung: Secondary | ICD-10-CM | POA: Diagnosis not present

## 2019-01-28 MED ORDER — IOHEXOL 300 MG/ML  SOLN
100.0000 mL | Freq: Once | INTRAMUSCULAR | Status: AC | PRN
  Administered 2019-01-28: 100 mL via INTRAVENOUS

## 2019-01-28 NOTE — Progress Notes (Signed)
[facility]  [address], [address] [place], [place] [address] Phone: [phone]  Fax: [phone]   Clinic Day:  01/31/2019  Referring physician: [name], NP  Chief Complaint: Jimmy West is a 68 y.o. male with metastatic adenocarcinoma of the lung who is seen for assessment prior to cycle #12pembrolizumab.  HPI: The patient was last seen in the medical oncology clinic on 01/10/2019. At that time, he was doing well.  He denied any respiratory symptoms.  He was back at work and had been playing golf.  Exam was stable.  Bilirubin was 1.3 (direct < 0.1).  He received cycle #11 pembrolizumab.  Restaging studies were scheduled.  Cervical and thoracic spine CT at [facility] on 01/10/2019 revealed unchanged severe compression deformity/vertebral plana of T4 with a stable degree of retropulsion and focal mild spinal canal stenosis. There was interval enlargement of a right lower lobe pulmonary mass (4.5 x 3.5 cm compared to 3.9 x 2.9 cm) with redemonstrated spiculated pulmonary nodules. He has not yet followed up at [facility].   Chest, abdomen, pelvis CT on 01/28/2019 revealed slight interval enlargement of a right lower lobe mass with central necrosis measuring 4.6 x 3.4 cm, previously 4.0 x 3.0 cm. There was no change in right upper lobe nodules (1.4 and 0.8 cm). There was almost no residua of left lung nodules, with irregular opacities in the apical left upper lobe and superior segment left lower lobe. There was no change in right hilar soft tissue and lymph nodes. There was vertebra plana deformity of T4 and no evidence of new osseous metastatic disease. No evidence of distant metastatic disease in the abdomen or pelvis. The 1.6 x 1.1 cm left adrenal nodule (non-metabolic on prior PET scan), was unchanged.  During the interim, he is doing "pretty good." He has back pain occasionally at work, but it is well controlled. He has played golf occasionally without back  pain. He would like to continue to put off surgery if possible but is agreeable to a change in therapy.    Past Medical History:  Diagnosis Date   Bulging lumbar disc    Cancer ([place])    stage 4 lung cancer   Heart attack [facility])     Past Surgical History:  Procedure Laterality Date   CARDIAC CATHETERIZATION     two stents   KNEE SURGERY Left    PORTA CATH INSERTION N/A 05/21/2018   Procedure: PORTA CATH INSERTION;  Surgeon: Algernon Huxley, MD;  Location: [facility];  Service: Cardiovascular;  Laterality: N/A;    Family History  Problem Relation Age of Onset   Heart failure Father    Cancer Maternal Aunt    Heart failure Maternal Uncle    Dementia Paternal Grandmother    Cancer Maternal Aunt     Social History:  reports that he quit smoking about 15 years ago. His smoking use included cigarettes. He has a 22.50 pack-year smoking history. He has never used smokeless tobacco. He reports current alcohol use of about 15.0 standard drinks of alcohol per week. He reports that he does not use drugs. He started smoking at age 89. He is smoking 1 1/2 packs/day. 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 He is working 4-5 hours a day, just with people in the community, and taking several precautions.Patient denies known exposures to radiation or toxins.The patient is alone today.  Allergies: No Known Allergies  Current Medications: Current Outpatient Medications  Medication Sig Dispense Refill   acetaminophen (TYLENOL) 500 MG tablet Take 500 mg by mouth 3 (three) times daily as needed.     aspirin EC 81 MG tablet Take 81 mg by mouth daily.     Calcium 600-400 MG-UNIT CHEW Chew 2 tablets by mouth daily.     fentaNYL (DURAGESIC) 75 MCG/HR Place 1 patch onto the skin every 3 (three) days. 10 patch 0   FLUoxetine (PROZAC) 20 MG capsule Take 1 capsule (20 mg total) by mouth daily.  30 capsule 3   ibuprofen (ADVIL,MOTRIN) 200 MG tablet Take 200 mg by mouth every 6 (six) hours as needed.     Oxycodone HCl 20 MG TABS Take 0.5-1 tablets (10-20 mg total) by mouth every 4 (four) hours as needed (for severe pain). 90 tablet 0   pembrolizumab (KEYTRUDA) 100 MG/4ML SOLN Inject 2 mg/kg into the vein every 30 (thirty) days.      polyethylene glycol (MIRALAX / GLYCOLAX) packet Take 17 g by mouth daily.     senna (SENOKOT) 8.6 MG tablet Take 1 tablet by mouth as needed.      naloxone (NARCAN) nasal spray 4 mg/0.1 mL 1 spray by nose in the event of opioid overdose. Call EMS (911) immediately if medication is used. (Patient not taking: Reported on 10/13/2018) 1 kit 0   No current facility-administered medications for this visit.    Facility-Administered Medications Ordered in Other Visits  Medication Dose Route Frequency Provider Last Rate Last Dose   heparin lock flush 100 unit/mL  500 Units Intravenous Once Niccolas Loeper C, MD       sodium chloride flush (NS) 0.9 % injection 10 mL  10 mL Intravenous PRN Lequita Asal, MD   10 mL at 01/31/19 0920    Review of Systems  Constitutional: Positive for weight loss (down 2 lbs). Negative for chills, diaphoresis, fever and malaise/fatigue.       Doing "pretty good".    HENT: Negative.  Negative for congestion, ear pain, nosebleeds, sinus pain and sore throat.   Eyes: Negative.  Negative for blurred vision, double vision, photophobia and pain.  Respiratory: Negative.  Negative for cough, hemoptysis, sputum production, shortness of breath and wheezing.   Cardiovascular: Negative.  Negative for chest pain, palpitations, orthopnea, leg swelling and PND.  Gastrointestinal: Negative.  Negative for abdominal pain, blood in stool, constipation, diarrhea, heartburn, melena, nausea and vomiting.  Genitourinary: Negative.  Negative for dysuria, frequency, hematuria and urgency.  Musculoskeletal: Positive for back pain (well  controlled). Negative for falls, joint pain, myalgias and neck pain.  Skin: Negative.  Negative for itching and rash.  Neurological: Positive for tingling (in mid back, occasional) and sensory change (intermittent numbness in mid back; chronic numbness to LEFT heel 2/2 disc issues). Negative for dizziness, tremors, speech change, focal weakness, weakness and headaches.  Endo/Heme/Allergies: Negative.  Does not bruise/bleed easily.  Psychiatric/Behavioral: Negative.  Negative for depression and memory loss. The patient is not nervous/anxious and does not have insomnia.   All other systems reviewed and are negative.  Performance status (ECOG): 1  Vitals Blood pressure 114/75, pulse 68, temperature 97.6 F (36.4 C), temperature source Tympanic, resp. rate 17, height 5' 6"  (1.676 m), weight 151 lb 2 oz (68.6 kg), SpO2 95 %.   Physical Exam  Constitutional: He is oriented to person, place, and time. He appears well-developed and well-nourished. No distress.  Thin gentleman sitting comfortably in  the exam room in no acute distress.  HENT:  Head: Normocephalic and atraumatic.  Mouth/Throat: Oropharynx is clear and moist. He has dentures. No oropharyngeal exudate.  Brown hair with slight graying.  Mustache.  Dentures.  Wearing a mask.    Eyes: Pupils are equal, round, and reactive to light. Conjunctivae and EOM are normal. No scleral icterus.  Gray blue eyes.  Neck: Normal range of motion. Neck supple. No JVD present.  Cardiovascular: Normal rate, regular rhythm and normal heart sounds. Exam reveals no gallop and no friction rub.  No murmur heard. Pulmonary/Chest: Effort normal and breath sounds normal. No respiratory distress. He has no wheezes. He has no rales. He exhibits no tenderness.  Abdominal: Soft. Bowel sounds are normal. He exhibits no distension and no mass. There is no abdominal tenderness. There is no rebound and no guarding.  Musculoskeletal: Normal range of motion.        General:  No edema.     Thoracic back: He exhibits tenderness (left T4 paravertebral muscle slightly uncomfortable to palpation- no change).  Lymphadenopathy:    He has no cervical adenopathy.    He has no axillary adenopathy.       Right: No supraclavicular adenopathy present.       Left: No supraclavicular adenopathy present.  Neurological: He is alert and oriented to person, place, and time.  Skin: Skin is warm and dry. No rash noted. He is not diaphoretic. No erythema.  Right elbow scrape s/p fall.  Psychiatric: He has a normal mood and affect. His behavior is normal. Judgment and thought content normal.  Nursing note and vitals reviewed.    Imaging studies: 04/27/2018:Chest CT angiogramrevealed no evidence of significant pulmonary embolus. There was a 4.1 cm mass in the superior segment right lower lobe posteriorly causing obliteration of the in coming bronchus and mild postobstructive change. There were multiple additional bilateral pulmonary nodules (<1 cm - 1.4 cm). There were enlarged right hilar lymph nodes. There was metastasis to the T4 vertebra with mild associated compression. There was emphysematous changes in the lungs. 04/30/2018:PET scanrevealed a 3.4 cm hypermetabolic RLL pulmonary mass (SUV 13.2), 11 mm pulmonary nodule in the LEFT lung (SUV 4.5), RIGHT paratracheal lymph node (SUV 5.1), and hypermetabolic activity within the T4 vertebral body (SUV 10.2).  05/03/2018:Thoracic spine MRIrevealed T4 metastasis with a 40% pathologic compression deformity and 5 mm of retropulsion of the vertebral body. Retropulsion results in mild spinal canal stenosis and mild bilateral C4-5 foraminal stenosis. There was paravertebral soft tissue thickening from mid T3 to mid T5, likely representing edema related to the pathologic compression deformity vs. possible extraosseous extension of the neoplasm. There were no additional thoracic spinal metastases noted.  05/03/2018:Head  MRIrevealed no intracranial metastatic disease. 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 Incidental mention made of mild paranasal sinus disease. 08/17/2018:Thoracic spine CT at [facility] on 08/17/2018 revealedpathologic compression fracture of T4resulting in vertebra plana. Fracture fragments protrude into the spinal canal resulting in moderate to severe canal narrowingat T4. There was severe right and moderate left foraminal narrowing at T4-T5 secondary to vertebra plana. There were multiple spiculated pulmonary masses consistent with metastatic adenocarcinoma, similar in appearance to 04/30/2018 PET/CT. 08/26/2018:Chest CTrevealed a 3.1 cm right lower lobe mass, corresponding to known primary bronchogenic neoplasm, mildly decreased. Bilateral pulmonary nodules/metastases, most of which are improved. There was mildly  progressive pleural-based nodularity in the right upper lobe along the minor fissure, including a dominant 17 x 11 mm nodule. There was a small right pleural effusion, decreased. There was a 15 mm left adrenal metastasis, decreased.  10/29/2018:[facility] severe compression deformity/vertebral vertebra plana of T4. There was increased sclerotic changes within the collapsed vertebral body possibly due to interval radiation. There was a stable degree of retropulsion and focal mild spinal canal stenosis at T4. There was interval enlargement of the left and right upper lobe spiculated pulmonary nodules and right lower lobe mass.  11/02/2018:Chest, abdomen, and pelvic CTrevealed the dominant right lower lobe mass has slightly enlarged compared with the most recent CT of 2 months ago, although demonstrated central necrosis. Current size was more similar to that demonstrated on  the PET-CT. Additional smaller pulmonary nodules were stable to slightly improved. No definite new nodules. Small right hilar and mediastinal lymph nodes were stable. There was persistent pleural thickening and small loculated right pleural effusion. There was stable pathologic fracture at T4. No new osseous metastases or fractures identified. There was no evidence of metastatic disease in the abdomen or pelvis. Images were reviewed with Dr Kathlene Cote. Lung lesions weresmaller. There was central necrosis in the RLL lesion. There wasno evidence of progressive disease. 01/10/2019:  Cervical and thoracic spine CT at [facility] revealed unchanged severe compression deformity/vertebral plana of T4 with a stable degree of retropulsion and focal mild spinal canal stenosis.  There was interval enlargement of a right lower lobe pulmonary mass (4.5 x 3.5 cm compared to 3.9 x 2.9 cm) with redemonstrated spiculated pulmonary nodules. 01/28/2019:  Chest, abdomen, pelvis CT revealed slight interval enlargement of a right lower lobe mass with central necrosis measuring 4.6 x 3.4 cm, previously 4.0 x 3.0 cm. There was no change in right upper lobe nodules (1.4 and 0.8 cm). There was almost no residua of left lung nodules, with irregular opacities in the apical left upper lobe and superior segment left lower lobe. There was no change in right hilar soft tissue and lymph nodes. There was vertebra plana deformity of T4 and no evidence of new osseous metastatic disease. No evidence of distant metastatic disease in the abdomen or pelvis. The 1.6 x 1.1 cm left adrenal nodule (non-metabolic on prior PET scan), was unchanged.   Infusion on 01/31/2019  Component Date Value Ref Range Status   Sodium 01/31/2019 136  135 - 145 mmol/L Final   Potassium 01/31/2019 3.9  3.5 - 5.1 mmol/L Final   Chloride 01/31/2019 104  98 - 111 mmol/L Final   CO2 01/31/2019 24  22 - 32 mmol/L Final   Glucose, Bld 01/31/2019 115* 70 - 99 mg/dL  Final   BUN 01/31/2019 11  8 - 23 mg/dL Final   Creatinine, Ser 01/31/2019 0.97  0.61 - 1.24 mg/dL Final   Calcium 01/31/2019 9.1  8.9 - 10.3 mg/dL Final   Total Protein 01/31/2019 7.2  6.5 - 8.1 g/dL Final   Albumin 01/31/2019 3.9  3.5 - 5.0 g/dL Final   AST 01/31/2019 17  15 - 41 U/L Final   ALT 01/31/2019 13  0 - 44 U/L Final   Alkaline Phosphatase 01/31/2019 73  38 - 126 U/L Final   Total Bilirubin 01/31/2019 0.8  0.3 - 1.2 mg/dL Final   GFR calc non Af Amer 01/31/2019 >60  >60 mL/min Final   GFR calc Af Amer 01/31/2019 >60  >60 mL/min Final   Anion gap 01/31/2019 8  5 - 15 Final   Performed at [facility], [address] [place], [place] [address]   WBC 01/31/2019 6.9  4.0 - 10.5 K/uL Final   RBC 01/31/2019 4.37  4.22 - 5.81 MIL/uL Final   Hemoglobin 01/31/2019 12.9* 13.0 - 17.0 g/dL Final   HCT 01/31/2019 38.9* 39.0 - 52.0 % Final   MCV 01/31/2019 89.0  80.0 - 100.0 fL Final   MCH 01/31/2019 29.5  26.0 - 34.0 pg Final   MCHC 01/31/2019 33.2  30.0 - 36.0 g/dL Final   RDW 01/31/2019 13.9  11.5 - 15.5 % Final   Platelets 01/31/2019 279  150 - 400 K/uL Final   nRBC 01/31/2019 0.0  0.0 - 0.2 % Final   Neutrophils Relative % 01/31/2019 72  % Final   Neutro Abs 01/31/2019 5.0  1.7 - 7.7 K/uL Final   Lymphocytes Relative 01/31/2019 10  % Final   Lymphs Abs 01/31/2019 0.7  0.7 - 4.0 K/uL Final   Monocytes Relative 01/31/2019 10  % Final   Monocytes Absolute 01/31/2019 0.7  0.1 - 1.0 K/uL Final   Eosinophils Relative 01/31/2019 7  % Final   Eosinophils Absolute 01/31/2019 0.5  0.0 - 0.5 K/uL Final   Basophils Relative 01/31/2019 1  % Final   Basophils Absolute 01/31/2019 0.1  0.0 - 0.1 K/uL Final   Immature Granulocytes 01/31/2019 0  % Final   Abs Immature Granulocytes 01/31/2019 0.02  0.00 - 0.07 K/uL Final   Performed at [facility], [address], [place], [place] [address]    Assessment:  Jimmy West is a  68 y.o. male with metastatichigh-grade adenocarcinomaof the right lungs/p CT-guided biopsy of a RLL lung mass on 05/11/2018. Pathologyrevealed an invasive high-grade adenocarcinoma, with predominantly solid growth pattern. The neoplastic cells were TTF-1 (+), Napsin A (+), and P40 (+). He has a T4 vertebral metastasis. Clinical stage is T4N1M1.  There was not enough material for [facility] testing. PDL-1revealed TPS 90%.  PET scanon 04/30/2018 revealed a 3.4 cm hypermetabolic RLL pulmonary mass (SUV 13.2), 11 mm pulmonary nodule in the LEFT lung (SUV 4.5), RIGHT paratracheal lymph node (SUV 5.1), and hypermetabolic activity within the T4 vertebral body (SUV 10.2).   Thoracic spine MRIon 05/03/2018 revealed T4 metastasiswith a 40% pathologic compression deformity and 5 mm of retropulsion of the vertebral body. Retropulsion results in mild spinal canal stenosis and mild bilateral C4-5 foraminal stenosis. There was paravertebral soft tissue thickening from mid T3 to mid T5, likely representing edema related to the pathologic compression deformity vs. possible extraosseous extension of the neoplasm. There were no additional thoracic spinal metastases noted.   Head MRIon 05/03/2018 revealed no intracranial metastatic disease. 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 Incidental mention made of mild paranasal sinus disease.  He received 11 cycles ofpembrolizumab(05/24/2018 - 01/10/2019). He tolerated treatment well. CEAwas 1.3 on 08/30/2018.LDHwas 165 on 11/29/2018.  He completed T4 radiationon 07/07/2018. He receives Xgevamonthly (06/03/2018 - 01/10/2019).  He hascancer-related painin T4. He is currently taking Fentanyl75 mcg/hr and oxycodone10 mg every 4 hours prn   Cervical and thoracic  spine CT at [facility] on 01/10/2019 revealed unchanged severe compression deformity/vertebral plana of T4 with a stable degree of retropulsion and focal mild spinal canal stenosis.  There was interval enlargement of a right lower lobe pulmonary mass (4.5  x 3.5 cm compared to 3.9 x 2.9 cm) with redemonstrated spiculated pulmonary nodules.  Chest, abdomen, pelvis CT on 01/28/2019 revealed slight interval enlargement of a right lower lobe mass with central necrosis measuring 4.6 x 3.4 cm, previously 4.0 x 3.0 cm. There was no change in right upper lobe nodules (1.4 and 0.8 cm). There was almost no residua of left lung nodules, with irregular opacities in the apical left upper lobe and superior segment left lower lobe. There was no change in right hilar soft tissue and lymph nodes. There was vertebra plana deformity of T4 and no evidence of new osseous metastatic disease. No evidence of distant metastatic disease in the abdomen or pelvis. The 1.6 x 1.1 cm left adrenal nodule (non-metabolic on prior PET scan), was unchanged.  Symptomatically, he denies any new complaints.  Exam is unchanged.  Plan: 1.   Labs today:CBC with diff, CMP, TSH. 2.   Metastatic high-grade adenocarcinoma the RIGHT lung Clinically, he continues to do well. He has received 12 cycles of pembrolizumab. Restaging studies on 01/28/2019 revealed interval enlargement of the primary right lower lobe lesion.  Imaging studies personally reviewed.  Agree with radiology interpretation. Discuss plan to change therapy to carboplatin, Alimta and pembrolizumab.   Discuss continuation of pembrolizumab given high PDL 1 expression and stability of other lesions.   Discuss potential side effects of carboplatin and Alimta     myelosuppression, renal dysfunction, electrolyte wasting, hair loss, nausea/vomiting.   Information provided.  Patient agrees to change in therapy.  B12 injection today and Rx folic acid in anticipation of chemotherapy next  week. 3.   T4 osseous metastasis Discussed interval cervical and thoracic MRI at [facility].  Imaging not available but report and notes apparent stability.   Discuss follow-up with [facility] neurosurgery.  Patient would like to postpone Continue monthly Xgeva (last 01/10/2019). Continue calcium 1200 mg and vitamin D 800 IU daily. 4.   Cancer-related pain Pain appears well controlled.    Continue Fentanyl 75 mcg/her patch..       Continue oxycodone 10 mg p.o. every 4 to 6 hours as needed pain. 5. RTC in 1 week for MD assessment, labs (CBC with diff, CMP, Mg, TSH) and cycle #1 carboplatin, Alimta and pembrolizumab plus Xgeva.  I discussed the assessment and treatment plan with the patient.  The patient was provided an opportunity to ask questions and all were answered.  The patient agreed with the plan and demonstrated an understanding of the instructions.  The patient was advised to call back if the symptoms worsen or if the condition fails to improve as anticipated.  I provided 25 minutes of face-to-face time during this this encounter and > 50% was spent counseling as documented under my assessment and plan.    Lequita Asal, MD, PhD    01/31/2019, 10:28 AM  I, Molly Dorshimer, am acting as Education administrator for [employer]. Mike Gip, MD, PhD.  I, Ayame Rena C. Mike Gip, MD, have reviewed the above documentation for accuracy and completeness, and I agree with the above.

## 2019-01-31 ENCOUNTER — Inpatient Hospital Stay: Payer: Medicare Other | Attending: Hematology and Oncology | Admitting: Hematology and Oncology

## 2019-01-31 ENCOUNTER — Encounter: Payer: Self-pay | Admitting: Hematology and Oncology

## 2019-01-31 ENCOUNTER — Other Ambulatory Visit: Payer: Self-pay | Admitting: Hematology and Oncology

## 2019-01-31 ENCOUNTER — Inpatient Hospital Stay: Payer: Medicare Other

## 2019-01-31 ENCOUNTER — Inpatient Hospital Stay: Payer: Medicare Other | Attending: Hematology and Oncology

## 2019-01-31 ENCOUNTER — Other Ambulatory Visit: Payer: Self-pay

## 2019-01-31 VITALS — BP 114/75 | HR 68 | Temp 97.6°F | Resp 17 | Ht 66.0 in | Wt 151.1 lb

## 2019-01-31 DIAGNOSIS — Z79899 Other long term (current) drug therapy: Secondary | ICD-10-CM

## 2019-01-31 DIAGNOSIS — Z5112 Encounter for antineoplastic immunotherapy: Secondary | ICD-10-CM | POA: Insufficient documentation

## 2019-01-31 DIAGNOSIS — Z791 Long term (current) use of non-steroidal anti-inflammatories (NSAID): Secondary | ICD-10-CM

## 2019-01-31 DIAGNOSIS — C3431 Malignant neoplasm of lower lobe, right bronchus or lung: Secondary | ICD-10-CM

## 2019-01-31 DIAGNOSIS — Z5111 Encounter for antineoplastic chemotherapy: Secondary | ICD-10-CM | POA: Diagnosis not present

## 2019-01-31 DIAGNOSIS — Z7189 Other specified counseling: Secondary | ICD-10-CM

## 2019-01-31 DIAGNOSIS — G893 Neoplasm related pain (acute) (chronic): Secondary | ICD-10-CM

## 2019-01-31 DIAGNOSIS — Z7982 Long term (current) use of aspirin: Secondary | ICD-10-CM | POA: Insufficient documentation

## 2019-01-31 DIAGNOSIS — Z87891 Personal history of nicotine dependence: Secondary | ICD-10-CM | POA: Insufficient documentation

## 2019-01-31 DIAGNOSIS — I252 Old myocardial infarction: Secondary | ICD-10-CM | POA: Diagnosis not present

## 2019-01-31 DIAGNOSIS — C7951 Secondary malignant neoplasm of bone: Secondary | ICD-10-CM

## 2019-01-31 LAB — COMPREHENSIVE METABOLIC PANEL
ALT: 13 U/L (ref 0–44)
AST: 17 U/L (ref 15–41)
Albumin: 3.9 g/dL (ref 3.5–5.0)
Alkaline Phosphatase: 73 U/L (ref 38–126)
Anion gap: 8 (ref 5–15)
BUN: 11 mg/dL (ref 8–23)
CO2: 24 mmol/L (ref 22–32)
Calcium: 9.1 mg/dL (ref 8.9–10.3)
Chloride: 104 mmol/L (ref 98–111)
Creatinine, Ser: 0.97 mg/dL (ref 0.61–1.24)
GFR calc Af Amer: >60 mL/min (ref >60)
GFR calc non Af Amer: >60 mL/min (ref >60)
Glucose, Bld: 115 mg/dL — ABNORMAL HIGH (ref 70–99)
Potassium: 3.9 mmol/L (ref 3.5–5.1)
Sodium: 136 mmol/L (ref 135–145)
Total Bilirubin: 0.8 mg/dL (ref 0.3–1.2)
Total Protein: 7.2 g/dL (ref 6.5–8.1)

## 2019-01-31 LAB — CBC WITH DIFFERENTIAL/PLATELET
Abs Immature Granulocytes: 0.02 10*3/uL (ref 0.00–0.07)
Basophils Absolute: 0.1 10*3/uL (ref 0.0–0.1)
Basophils Relative: 1 %
Eosinophils Absolute: 0.5 10*3/uL (ref 0.0–0.5)
Eosinophils Relative: 7 %
HCT: 38.9 % — ABNORMAL LOW (ref 39.0–52.0)
Hemoglobin: 12.9 g/dL — ABNORMAL LOW (ref 13.0–17.0)
Immature Granulocytes: 0 %
Lymphocytes Relative: 10 %
Lymphs Abs: 0.7 10*3/uL (ref 0.7–4.0)
MCH: 29.5 pg (ref 26.0–34.0)
MCHC: 33.2 g/dL (ref 30.0–36.0)
MCV: 89 fL (ref 80.0–100.0)
Monocytes Absolute: 0.7 10*3/uL (ref 0.1–1.0)
Monocytes Relative: 10 %
Neutro Abs: 5 10*3/uL (ref 1.7–7.7)
Neutrophils Relative %: 72 %
Platelets: 279 10*3/uL (ref 150–400)
RBC: 4.37 MIL/uL (ref 4.22–5.81)
RDW: 13.9 % (ref 11.5–15.5)
WBC: 6.9 10*3/uL (ref 4.0–10.5)
nRBC: 0 % (ref 0.0–0.2)

## 2019-01-31 LAB — TSH: TSH: 1.815 u[IU]/mL (ref 0.350–4.500)

## 2019-01-31 MED ORDER — CYANOCOBALAMIN 1000 MCG/ML IJ SOLN
INTRAMUSCULAR | Status: AC
  Filled 2019-01-31: qty 1

## 2019-01-31 MED ORDER — CYANOCOBALAMIN 1000 MCG/ML IJ SOLN
1000.0000 ug | Freq: Once | INTRAMUSCULAR | Status: AC
  Administered 2019-01-31: 1000 ug via INTRAMUSCULAR

## 2019-01-31 MED ORDER — FOLIC ACID 1 MG PO TABS: 1.0000 mg | ORAL_TABLET | Freq: Every day | ORAL | 3 refills | Status: DC

## 2019-01-31 MED ORDER — DEXAMETHASONE 4 MG PO TABS: ORAL_TABLET | ORAL | 1 refills | Status: DC

## 2019-01-31 MED ORDER — SODIUM CHLORIDE 0.9% FLUSH
10.0000 mL | INTRAVENOUS | Status: DC | PRN
  Administered 2019-01-31: 09:00:00 10 mL via INTRAVENOUS
  Filled 2019-01-31: qty 10

## 2019-01-31 MED ORDER — HEPARIN SOD (PORK) LOCK FLUSH 100 UNIT/ML IV SOLN
500.0000 [IU] | Freq: Once | INTRAVENOUS | Status: AC
  Administered 2019-01-31: 500 [IU] via INTRAVENOUS

## 2019-01-31 NOTE — Progress Notes (Signed)
Nutrition  Called patient for nutrition follow-up.  No answer. Left message on voicemail with call back number.  Farron Lafond B. Zenia Resides, [place], [place] Registered Dietitian [phone] (pager)

## 2019-01-31 NOTE — Progress Notes (Signed)
[facility] - Non-Small Cell Lung  No Change  Continue With Treatment as Ordered.     A cycle is every 21 days:     Pembrolizumab      Pemetrexed      Carboplatin   **Always confirm dose/schedule in your pharmacy ordering system**  Patient Characteristics: Stage IV Metastatic, Nonsquamous, Initial Chemotherapy/Immunotherapy, PS = 0, 1, ALK Translocation Negative/Unknown and EGFR Mutation Negative/Non-Sensitizing/Unknown, PD-L1 Expression Positive 1-49% (TPS) / Negative / Not Tested / Awaiting Test Results  and Immunotherapy Candidate AJCC T Category: T2a Current Disease Status: Distant Metastases AJCC N Category: N2 AJCC M Category: M1b AJCC 8 Stage Grouping: IVA Histology: Nonsquamous Cell ROS1 Rearrangement Status: Awaiting Test Results T790M Mutation Status: Not Applicable - EGFR Mutation Negative/Unknown Other Mutations/Biomarkers: No Other Actionable Mutations NTRK Gene Fusion Status: Awaiting Test Results PD-L1 Expression Status: Awaiting Test Results Chemotherapy/Immunotherapy LOT: Initial Chemotherapy/Immunotherapy Molecular Targeted Therapy: Not Appropriate ALK Translocation Status: Awaiting Test Results EGFR Mutation Status: Awaiting Test Results BRAF V600E Mutation Status: Awaiting Test Results Performance Status: PS = 0, 1 Immunotherapy Candidate Status: Candidate for Immunotherapy Intent of Therapy: Non-Curative / Palliative Intent, Discussed with Patient

## 2019-01-31 NOTE — Progress Notes (Signed)
No new changes noted today 

## 2019-01-31 NOTE — Patient Instructions (Signed)
Patient to take folic acid 1 mg a day.  Patient to call with at home Decadron prescription  (read directions to nurse to assure same as new prescription).     Pemetrexed injection What is this medicine? PEMETREXED (PEM e TREX ed) is a chemotherapy drug used to treat lung cancers like non-small cell lung cancer and mesothelioma. It may also be used to treat other cancers. This medicine may be used for other purposes; ask your health care provider or pharmacist if you have questions. COMMON BRAND NAME(S): Alimta What should I tell my health care provider before I take this medicine? They need to know if you have any of these conditions:  infection (especially a virus infection such as chickenpox, cold sores, or herpes)  kidney disease  low blood counts, like low white cell, platelet, or red cell counts  lung or breathing disease, like asthma  radiation therapy  an unusual or allergic reaction to pemetrexed, other medicines, foods, dyes, or preservative  pregnant or trying to get pregnant  breast-feeding How should I use this medicine? This drug is given as an infusion into a vein. It is administered in a hospital or clinic by a specially trained health care professional. Talk to your pediatrician regarding the use of this medicine in children. Special care may be needed. Overdosage: If you think you have taken too much of this medicine contact a poison control center or emergency room at once. NOTE: This medicine is only for you. Do not share this medicine with others. What if I miss a dose? It is important not to miss your dose. Call your doctor or health care professional if you are unable to keep an appointment. What may interact with this medicine? This medicine may interact with the following medications:  Ibuprofen This list may not describe all possible interactions. Give your health care provider a list of all the medicines, herbs, non-prescription drugs, or dietary  supplements you use. Also tell them if you smoke, drink alcohol, or use illegal drugs. Some items may interact with your medicine. What should I watch for while using this medicine? Visit your doctor for checks on your progress. This drug may make you feel generally unwell. This is not uncommon, as chemotherapy can affect healthy cells as well as cancer cells. Report any side effects. Continue your course of treatment even though you feel ill unless your doctor tells you to stop. In some cases, you may be given additional medicines to help with side effects. Follow all directions for their use. Call your doctor or health care professional for advice if you get a fever, chills or sore throat, or other symptoms of a cold or flu. Do not treat yourself. This drug decreases your body's ability to fight infections. Try to avoid being around people who are sick. This medicine may increase your risk to bruise or bleed. Call your doctor or health care professional if you notice any unusual bleeding. Be careful brushing and flossing your teeth or using a toothpick because you may get an infection or bleed more easily. If you have any dental work done, tell your dentist you are receiving this medicine. Avoid taking products that contain aspirin, acetaminophen, ibuprofen, naproxen, or ketoprofen unless instructed by your doctor. These medicines may hide a fever. Call your doctor or health care professional if you get diarrhea or mouth sores. Do not treat yourself. To protect your kidneys, drink water or other fluids as directed while you are taking this medicine. Do  not become pregnant while taking this medicine or for 6 months after stopping it. Women should inform their doctor if they wish to become pregnant or think they might be pregnant. Men should not father a child while taking this medicine and for 3 months after stopping it. This may interfere with the ability to father a child. You should talk to your doctor  or health care professional if you are concerned about your fertility. There is a potential for serious side effects to an unborn child. Talk to your health care professional or pharmacist for more information. Do not breast-feed an infant while taking this medicine or for 1 week after stopping it. What side effects may I notice from receiving this medicine? Side effects that you should report to your doctor or health care professional as soon as possible:  allergic reactions like skin rash, itching or hives, swelling of the face, lips, or tongue  breathing problems  redness, blistering, peeling or loosening of the skin, including inside the mouth  signs and symptoms of bleeding such as bloody or black, tarry stools; red or dark-brown urine; spitting up blood or brown material that looks like coffee grounds; red spots on the skin; unusual bruising or bleeding from the eye, gums, or nose  signs and symptoms of infection like fever or chills; cough; sore throat; pain or trouble passing urine  signs and symptoms of kidney injury like trouble passing urine or change in the amount of urine  signs and symptoms of liver injury like dark yellow or brown urine; general ill feeling or flu-like symptoms; light-colored stools; loss of appetite; nausea; right upper belly pain; unusually weak or tired; yellowing of the eyes or skin Side effects that usually do not require medical attention (report to your doctor or health care professional if they continue or are bothersome):  constipation  mouth sores  nausea, vomiting  unusually weak or tired This list may not describe all possible side effects. Call your doctor for medical advice about side effects. You may report side effects to FDA at 1-800-FDA-1088. Where should I keep my medicine? This drug is given in a hospital or clinic and will not be stored at home. NOTE: This sheet is a summary. It may not cover all possible information. If you have  questions about this medicine, talk to your doctor, pharmacist, or health care provider.  2020 [employer] Standard (2017-08-26 16:11:33) Carboplatin injection What is this medicine? CARBOPLATIN (KAR boe pla tin) is a chemotherapy drug. It targets fast dividing cells, like cancer cells, and causes these cells to die. This medicine is used to treat ovarian cancer and many other cancers. This medicine may be used for other purposes; ask your health care provider or pharmacist if you have questions. COMMON BRAND NAME(S): Paraplatin What should I tell my health care provider before I take this medicine? They need to know if you have any of these conditions:  blood disorders  hearing problems  kidney disease  recent or ongoing radiation therapy  an unusual or allergic reaction to carboplatin, cisplatin, other chemotherapy, other medicines, foods, dyes, or preservatives  pregnant or trying to get pregnant  breast-feeding How should I use this medicine? This drug is usually given as an infusion into a vein. It is administered in a hospital or clinic by a specially trained health care professional. Talk to your pediatrician regarding the use of this medicine in children. Special care may be needed. Overdosage: If you think you have taken too much  of this medicine contact a poison control center or emergency room at once. NOTE: This medicine is only for you. Do not share this medicine with others. What if I miss a dose? It is important not to miss a dose. Call your doctor or health care professional if you are unable to keep an appointment. What may interact with this medicine?  medicines for seizures  medicines to increase blood counts like filgrastim, pegfilgrastim, sargramostim  some antibiotics like amikacin, gentamicin, neomycin, streptomycin, tobramycin  vaccines Talk to your doctor or health care professional before taking any of these  medicines:  acetaminophen  aspirin  ibuprofen  ketoprofen  naproxen This list may not describe all possible interactions. Give your health care provider a list of all the medicines, herbs, non-prescription drugs, or dietary supplements you use. Also tell them if you smoke, drink alcohol, or use illegal drugs. Some items may interact with your medicine. What should I watch for while using this medicine? Your condition will be monitored carefully while you are receiving this medicine. You will need important blood work done while you are taking this medicine. This drug may make you feel generally unwell. This is not uncommon, as chemotherapy can affect healthy cells as well as cancer cells. Report any side effects. Continue your course of treatment even though you feel ill unless your doctor tells you to stop. In some cases, you may be given additional medicines to help with side effects. Follow all directions for their use. Call your doctor or health care professional for advice if you get a fever, chills or sore throat, or other symptoms of a cold or flu. Do not treat yourself. This drug decreases your body's ability to fight infections. Try to avoid being around people who are sick. This medicine may increase your risk to bruise or bleed. Call your doctor or health care professional if you notice any unusual bleeding. Be careful brushing and flossing your teeth or using a toothpick because you may get an infection or bleed more easily. If you have any dental work done, tell your dentist you are receiving this medicine. Avoid taking products that contain aspirin, acetaminophen, ibuprofen, naproxen, or ketoprofen unless instructed by your doctor. These medicines may hide a fever. Do not become pregnant while taking this medicine. Women should inform their doctor if they wish to become pregnant or think they might be pregnant. There is a potential for serious side effects to an unborn child. Talk  to your health care professional or pharmacist for more information. Do not breast-feed an infant while taking this medicine. What side effects may I notice from receiving this medicine? Side effects that you should report to your doctor or health care professional as soon as possible:  allergic reactions like skin rash, itching or hives, swelling of the face, lips, or tongue  signs of infection - fever or chills, cough, sore throat, pain or difficulty passing urine  signs of decreased platelets or bleeding - bruising, pinpoint red spots on the skin, black, tarry stools, nosebleeds  signs of decreased red blood cells - unusually weak or tired, fainting spells, lightheadedness  breathing problems  changes in hearing  changes in vision  chest pain  high blood pressure  low blood counts - This drug may decrease the number of white blood cells, red blood cells and platelets. You may be at increased risk for infections and bleeding.  nausea and vomiting  pain, swelling, redness or irritation at the injection site  pain,  tingling, numbness in the hands or feet  problems with balance, talking, walking  trouble passing urine or change in the amount of urine Side effects that usually do not require medical attention (report to your doctor or health care professional if they continue or are bothersome):  hair loss  loss of appetite  metallic taste in the mouth or changes in taste This list may not describe all possible side effects. Call your doctor for medical advice about side effects. You may report side effects to FDA at 1-800-FDA-1088. Where should I keep my medicine? This drug is given in a hospital or clinic and will not be stored at home. NOTE: This sheet is a summary. It may not cover all possible information. If you have questions about this medicine, talk to your doctor, pharmacist, or health care provider.  2020 [employer]/Gold Standard (2007-10-12 14:38:05)

## 2019-02-01 LAB — T4: T4, Total: 9 ug/dL (ref 4.5–12.0)

## 2019-02-04 NOTE — Progress Notes (Signed)
[facility]  [address] [place], [place] [address] Phone: [phone]  Fax: [phone]   Clinic Day:  02/07/2019  Referring physician: [name], NP  Chief Complaint: Jimmy West is a 68 y.o. male with metastatic adenocarcinoma of the lung who is seen for assessment prior to cycle #1 carboplatin, Alimta and pembrolizumab.   HPI: The patient was last seen in the medical oncology clinic on 01/31/2019. At that time, he denied any new complaints.  Exam was unchanged.  Scans revealed progressive disease.  We discussed the paln for carboplatin, Alimta, and pembrolizumab.  In anticipation of Alimta, he received a B12 injection.  He began folic acid 1 mg a day.  During the interim, he is doing well. His back pain is well managed. He has not been contacted by [facility] neurosurgery regarding his back issues. He is up 3lbs in the clinic today. Numbness and tingling in his mid-back and left heel comes and goes.   He has continued taking folic acid.    Past Medical History:  Diagnosis Date   Bulging lumbar disc    Cancer ([place])    stage 4 lung cancer   Heart attack [facility])     Past Surgical History:  Procedure Laterality Date   CARDIAC CATHETERIZATION     two stents   KNEE SURGERY Left    PORTA CATH INSERTION N/A 05/21/2018   Procedure: PORTA CATH INSERTION;  Surgeon: Algernon Huxley, MD;  Location: [facility];  Service: Cardiovascular;  Laterality: N/A;    Family History  Problem Relation Age of Onset   Heart failure Father    Cancer Maternal Aunt    Heart failure Maternal Uncle    Dementia Paternal Grandmother    Cancer Maternal Aunt     Social History:  reports that he quit smoking about 15 years ago. His smoking use included cigarettes. He has a 22.50 pack-year smoking history. He has never used smokeless tobacco. He reports current alcohol use of about 15.0 standard drinks of alcohol per week. He reports that he  does not use drugs. He started smoking at age 81. He is smoking 1 1/2 packs/day. 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 He is working 4-5 hours a day, just with people in the community, and taking several precautions. Patient denies known exposures to radiation or toxins.The patient is alone today.  Allergies: No Known Allergies  Current Medications: Current Outpatient Medications  Medication Sig Dispense Refill   acetaminophen (TYLENOL) 500 MG tablet Take 500 mg by mouth 3 (three) times daily as needed.     aspirin EC 81 MG tablet Take 81 mg by mouth daily.     Calcium 600-400 MG-UNIT CHEW Chew 2 tablets by mouth daily.     dexamethasone (DECADRON) 4 MG tablet Take 1 tab two times a day the day before Alimta chemo, then take 2 tabs once a day for 3 days starting the day after chemo. 30 tablet 1   fentaNYL (DURAGESIC) 75 MCG/HR Place 1 patch onto the skin every 3 (three) days. 10 patch 0   FLUoxetine (PROZAC) 20 MG capsule Take 1 capsule (20 mg total) by mouth daily. 30 capsule 3   folic acid (FOLVITE) 1 MG tablet Take 1 tablet (1 mg total) by mouth daily. Start 5-7 days before Alimta chemotherapy. Continue until 21 days after Alimta completed. 100 tablet 3  ibuprofen (ADVIL,MOTRIN) 200 MG tablet Take 200 mg by mouth every 6 (six) hours as needed.     Oxycodone HCl 20 MG TABS Take 0.5-1 tablets (10-20 mg total) by mouth every 4 (four) hours as needed (for severe pain). 90 tablet 0   pembrolizumab (KEYTRUDA) 100 MG/4ML SOLN Inject 2 mg/kg into the vein every 30 (thirty) days.      polyethylene glycol (MIRALAX / GLYCOLAX) packet Take 17 g by mouth daily.     senna (SENOKOT) 8.6 MG tablet Take 1 tablet by mouth as needed.      naloxone (NARCAN) nasal spray 4 mg/0.1 mL 1 spray by nose in the event of opioid overdose. Call EMS (911) immediately if medication is used. (Patient not taking:  Reported on 10/13/2018) 1 kit 0   No current facility-administered medications for this visit.    Facility-Administered Medications Ordered in Other Visits  Medication Dose Route Frequency Provider Last Rate Last Dose   heparin lock flush 100 unit/mL  500 Units Intravenous Once Stephnie Parlier C, MD       sodium chloride flush (NS) 0.9 % injection 10 mL  10 mL Intravenous PRN Lequita Asal, MD   10 mL at 02/07/19 0919    Review of Systems  Constitutional: Negative for chills, diaphoresis, fever, malaise/fatigue and weight loss (up 3lbs).       Doing well.  HENT: Negative.  Negative for congestion, ear pain, nosebleeds, sinus pain and sore throat.   Eyes: Negative.  Negative for blurred vision, double vision, photophobia and pain.  Respiratory: Negative.  Negative for cough, hemoptysis, sputum production, shortness of breath and wheezing.   Cardiovascular: Negative.  Negative for chest pain, palpitations, orthopnea, leg swelling and PND.  Gastrointestinal: Negative.  Negative for abdominal pain, blood in stool, constipation, diarrhea, heartburn, melena, nausea and vomiting.  Genitourinary: Negative.  Negative for dysuria, frequency, hematuria and urgency.  Musculoskeletal: Positive for back pain (well controlled). Negative for falls, joint pain, myalgias and neck pain.  Skin: Negative.  Negative for itching and rash.  Neurological: Positive for tingling (in mid back, occasional) and sensory change (intermittent numbness in mid back; chronic numbness to LEFT heel 2/2 disc issues). Negative for dizziness, tremors, speech change, focal weakness, weakness and headaches.  Endo/Heme/Allergies: Negative.  Does not bruise/bleed easily.  Psychiatric/Behavioral: Negative.  Negative for depression and memory loss. The patient is not nervous/anxious and does not have insomnia.   All other systems reviewed and are negative.  Performance status (ECOG): 1  Vitals Blood pressure 108/69, pulse  66, temperature 97.6 F (36.4 C), temperature source Tympanic, resp. rate 17, weight 154 lb 1.6 oz (69.9 kg), SpO2 94 %.   Physical Exam  Constitutional: He is oriented to person, place, and time. No distress.  Thin gentleman sitting comfortably in the exam room in no acute distress.  HENT:  Head: Normocephalic and atraumatic.  Mouth/Throat: Oropharynx is clear and moist. He has dentures. No oropharyngeal exudate.  Brown hair with slight graying.  Mustache.  Dentures.  Wearing a mask.    Eyes: Pupils are equal, round, and reactive to light. Conjunctivae and EOM are normal. No scleral icterus.  Glasses.  Gray blue eyes.  Neck: Normal range of motion. Neck supple. No JVD present.  Cardiovascular: Normal rate, regular rhythm and normal heart sounds. Exam reveals no gallop and no friction rub.  No murmur heard. Pulmonary/Chest: Effort normal and breath sounds normal. No respiratory distress. He has no wheezes. He has no rales.  Abdominal:  Soft. Bowel sounds are normal. He exhibits no distension and no mass. There is no abdominal tenderness. There is no rebound.  Musculoskeletal: Normal range of motion.        General: No edema.     Comments: Slight tenderness at T4.  Lymphadenopathy:    He has no cervical adenopathy.    He has no axillary adenopathy.       Right: No supraclavicular adenopathy present.       Left: No supraclavicular adenopathy present.  Neurological: He is alert and oriented to person, place, and time.  Skin: Skin is warm and dry. No rash noted. He is not diaphoretic. No erythema.  Psychiatric: He has a normal mood and affect. His behavior is normal. Judgment and thought content normal.  Nursing note and vitals reviewed.   Imaging studies: 04/27/2018:Chest CT angiogramrevealed no evidence of significant pulmonary embolus. There was a 4.1 cm mass in the superior segment right lower lobe posteriorly causing obliteration of the in coming bronchus and mild postobstructive  change. There were multiple additional bilateral pulmonary nodules (<1 cm - 1.4 cm). There were enlarged right hilar lymph nodes. There was metastasis to the T4 vertebra with mild associated compression. There was emphysematous changes in the lungs. 04/30/2018:PET scanrevealed a 3.4 cm hypermetabolic RLL pulmonary mass (SUV 13.2), 11 mm pulmonary nodule in the LEFT lung (SUV 4.5), RIGHT paratracheal lymph node (SUV 5.1), and hypermetabolic activity within the T4 vertebral body (SUV 10.2).  05/03/2018:Thoracic spine MRIrevealed T4 metastasis with a 40% pathologic compression deformity and 5 mm of retropulsion of the vertebral body. Retropulsion results in mild spinal canal stenosis and mild bilateral C4-5 foraminal stenosis. There was paravertebral soft tissue thickening from mid T3 to mid T5, likely representing edema related to the pathologic compression deformity vs. possible extraosseous extension of the neoplasm. There were no additional thoracic spinal metastases noted.  05/03/2018:Head MRIrevealed no intracranial metastatic disease. 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 Incidental mention made of mild paranasal sinus disease. 08/17/2018:Thoracic spine CT at [facility] on 08/17/2018 revealedpathologic compression fracture of T4resulting in vertebra plana. Fracture fragments protrude into the spinal canal resulting in moderate to severe canal narrowingat T4. There was severe right and moderate left foraminal narrowing at T4-T5 secondary to vertebra plana. There were multiple spiculated pulmonary masses consistent with metastatic adenocarcinoma, similar in appearance to 04/30/2018 PET/CT. 08/26/2018:Chest CTrevealed a 3.1 cm right lower lobe mass, corresponding to known primary bronchogenic neoplasm, mildly  decreased. Bilateral pulmonary nodules/metastases, most of which are improved. There was mildly progressive pleural-based nodularity in the right upper lobe along the minor fissure, including a dominant 17 x 11 mm nodule. There was a small right pleural effusion, decreased. There was a 15 mm left adrenal metastasis, decreased.  10/29/2018:[facility] revealed[facility]anged severe compression deformity/vertebral vertebra plana of T4. There was increased sclerotic changes within the collapsed vertebral body possibly due to interval radiation. There was a stable degree of retropulsion and focal mild spinal canal stenosis at T4. There was interval enlargement of the left and right upper lobe spiculated pulmonary nodules and right lower lobe mass.  11/02/2018:Chest, abdomen, and pelvic CTrevealed the dominant right lower lobe mass has slightly enlarged compared with the most recent CT of 2 months ago, although demonstrated central necrosis. Current size was more similar to that demonstrated on the PET-CT. Additional smaller pulmonary  nodules were stable to slightly improved. No definite new nodules. Small right hilar and mediastinal lymph nodes were stable. There was persistent pleural thickening and small loculated right pleural effusion. There was stable pathologic fracture at T4. No new osseous metastases or fractures identified. There was no evidence of metastatic disease in the abdomen or pelvis. Images were reviewed with Dr Kathlene Cote. Lung lesions weresmaller. There was central necrosis in the RLL lesion. There wasno evidence of progressive disease. 01/10/2019:  Cervical and thoracic spine CT at [facility] revealed unchanged severe compression deformity/vertebral plana of T4 with a stable degree of retropulsion and focal mild spinal canal stenosis.  There was interval enlargement of a right lower lobe pulmonary mass (4.5 x 3.5 cm compared to 3.9 x 2.9 cm) with redemonstrated spiculated  pulmonary nodules. 01/28/2019:  Chest, abdomen, pelvis CT revealed slight interval enlargement of a right lower lobe mass with central necrosis measuring 4.6 x 3.4 cm, previously 4.0 x 3.0 cm. There was no change in right upper lobe nodules (1.4 and 0.8 cm). There was almost no residua of left lung nodules, with irregular opacities in the apical left upper lobe and superior segment left lower lobe. There was no change in right hilar soft tissue and lymph nodes. There was vertebra plana deformity of T4 and no evidence of new osseous metastatic disease. No evidence of distant metastatic disease in the abdomen or pelvis. The 1.6 x 1.1 cm left adrenal nodule (non-metabolic on prior PET scan), was unchanged.   Infusion on 02/07/2019  Component Date Value Ref Range Status   Sodium 02/07/2019 135  135 - 145 mmol/L Final   Potassium 02/07/2019 4.1  3.5 - 5.1 mmol/L Final   Chloride 02/07/2019 103  98 - 111 mmol/L Final   CO2 02/07/2019 23  22 - 32 mmol/L Final   Glucose, Bld 02/07/2019 122* 70 - 99 mg/dL Final   BUN 02/07/2019 9  8 - 23 mg/dL Final   Creatinine, Ser 02/07/2019 0.89  0.61 - 1.24 mg/dL Final   Calcium 02/07/2019 9.0  8.9 - 10.3 mg/dL Final   Total Protein 02/07/2019 7.1  6.5 - 8.1 g/dL Final   Albumin 02/07/2019 3.9  3.5 - 5.0 g/dL Final   AST 02/07/2019 18  15 - 41 U/L Final   ALT 02/07/2019 13  0 - 44 U/L Final   Alkaline Phosphatase 02/07/2019 70  38 - 126 U/L Final   Total Bilirubin 02/07/2019 1.0  0.3 - 1.2 mg/dL Final   GFR calc non Af Amer 02/07/2019 >60  >60 mL/min Final   GFR calc Af Amer 02/07/2019 >60  >60 mL/min Final   Anion gap 02/07/2019 9  5 - 15 Final   Performed at [facility], [address], [place], [place] [address]   WBC 02/07/2019 7.8  4.0 - 10.5 K/uL Final   RBC 02/07/2019 4.37  4.22 - 5.81 MIL/uL Final   Hemoglobin 02/07/2019 13.1  13.0 - 17.0 g/dL Final   HCT 02/07/2019 38.9* 39.0 - 52.0 % Final   MCV 02/07/2019 89.0   80.0 - 100.0 fL Final   MCH 02/07/2019 30.0  26.0 - 34.0 pg Final   MCHC 02/07/2019 33.7  30.0 - 36.0 g/dL Final   RDW 02/07/2019 13.6  11.5 - 15.5 % Final   Platelets 02/07/2019 272  150 - 400 K/uL Final   nRBC 02/07/2019 0.0  0.0 - 0.2 % Final   Neutrophils Relative % 02/07/2019 75  % Final   Neutro Abs  02/07/2019 5.8  1.7 - 7.7 K/uL Final   Lymphocytes Relative 02/07/2019 9  % Final   Lymphs Abs 02/07/2019 0.7  0.7 - 4.0 K/uL Final   Monocytes Relative 02/07/2019 10  % Final   Monocytes Absolute 02/07/2019 0.8  0.1 - 1.0 K/uL Final   Eosinophils Relative 02/07/2019 5  % Final   Eosinophils Absolute 02/07/2019 0.4  0.0 - 0.5 K/uL Final   Basophils Relative 02/07/2019 1  % Final   Basophils Absolute 02/07/2019 0.1  0.0 - 0.1 K/uL Final   Immature Granulocytes 02/07/2019 0  % Final   Abs Immature Granulocytes 02/07/2019 0.03  0.00 - 0.07 K/uL Final   Performed at [facility], [address] [place], [place] [address]   Magnesium 02/07/2019 2.1  1.7 - 2.4 mg/dL Final   Performed at [facility], [address] [place], [place] [address]    Assessment:  Jimmy West is a 68 y.o. male with metastatichigh-grade adenocarcinomaof the right lungs/p CT-guided biopsy of a RLL lung mass on 05/11/2018. Pathologyrevealed an invasive high-grade adenocarcinoma, with predominantly solid growth pattern. The neoplastic cells were TTF-1 (+), Napsin A (+), and P40 (+). He has a T4 vertebral metastasis. Clinical stage is T4N1M1.  There was not enough material for [facility] testing. PDL-1revealed TPS 90%.  PET scanon 04/30/2018 revealed a 3.4 cm hypermetabolic RLL pulmonary mass (SUV 13.2), 11 mm pulmonary nodule in the LEFT lung (SUV 4.5), RIGHT paratracheal lymph node (SUV 5.1), and hypermetabolic activity within the T4 vertebral body (SUV 10.2).   Thoracic spine MRIon 05/03/2018 revealed T4 metastasiswith a 40% pathologic  compression deformity and 5 mm of retropulsion of the vertebral body. Retropulsion results in mild spinal canal stenosis and mild bilateral C4-5 foraminal stenosis. There was paravertebral soft tissue thickening from mid T3 to mid T5, likely representing edema related to the pathologic compression deformity vs. possible extraosseous extension of the neoplasm. There were no additional thoracic spinal metastases noted.   Head MRIon 05/03/2018 revealed no intracranial metastatic disease. 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 Incidental mention made of mild paranasal sinus disease.  He received 11 cycles ofpembrolizumab(05/24/2018 - 01/10/2019). He tolerated treatment well. CEAwas 1.3 on 08/30/2018.LDHwas 165 on 11/29/2018.  He completed T4 radiationon 07/07/2018. He receives Xgevamonthly (06/03/2018 - 01/10/2019).  He hascancer-related painin T4. He is currently taking Fentanyl75 mcg/hr and oxycodone10 mg every 4 hours prn   Cervical and thoracic spine CT at [facility] on 01/10/2019 revealed unchanged severe compression deformity/vertebral plana of T4 with a stable degree of retropulsion and focal mild spinal canal stenosis.  There was interval enlargement of a right lower lobe pulmonary mass (4.5 x 3.5 cm compared to 3.9 x 2.9 cm) with redemonstrated spiculated pulmonary nodules.  Chest, abdomen, pelvis CT on 01/28/2019 revealed slight interval enlargement of a right lower lobe mass with central necrosis measuring 4.6 x 3.4 cm, previously 4.0 x 3.0 cm. There was no change in right upper lobe nodules (1.4 and 0.8 cm). There was almost no residua of left lung nodules, with irregular opacities in the apical left upper lobe and superior segment left lower lobe. There was no change in right hilar soft tissue and  lymph nodes. There was vertebra plana deformity of T4 and no evidence of new osseous metastatic disease. No evidence of distant metastatic disease in  the abdomen or pelvis. The 1.6 x 1.1 cm left adrenal nodule (non-metabolic on prior PET scan), was unchanged.  Symptomatically, he is doing well.  Back pain is well controlled.  He is working and sometimes Marketing executive.  He forgot to take his steroids.  Plan: 1.  Labs today:CBC with diff, CMP, TSH, Mg. 2.Metastatic high-grade adenocarcinoma the RIGHT lung Clinically, he is doing well. He has received 11 cycles of pembrolizumab.   Discuss plans for initiation of carboplatin, Alimta and pembrolizumab   Review rationale for continuation of pembrolizumab given high PDL-1 expression and stability of other lesions.   Review potential side effects of carboplatin and Alimta.   He received B12 last week and has been on daily oral folic acid to prevent side effects of Alimta.   He inadvertently forgot to take his steroids yesterday.      Discuss postponing treatment today.    He will receive treatment tomorrow.   Multiple questions were asked and answered.   Patient consented to initiation of treatment tomorrow.  Discuss plans for follow-up visit in 10 days for nadir assessment.  Discuss management of nausea.  Discuss importance of good hydration. 3.T4 osseous metastasis Re-review since cervical and thoracic spine MRI at [facility]. By report, T4 lesion appears stable. Encourage patient to follow-up with [place] neurosurgery. Continue monthly Xgeva (due today). Continue calcium 1200 mg and vitamin D 800 IU daily. 4.Cancer-related pain Pain is well controlled.        Continue Fentanyl 75 mcg/h patch. Continue oxycodone 10 mg p.o. every 4 hours as needed pain. 5.   RTC tomorrow for labs (BMP) and pembrolizumab, carboplatin, and Alimta. 6.   RTC on 02/17/2019 for MD assessment and labs (CBC with diff, BMP, Mg). 7.   RTC on 02/28/2019  for MD assessment, labs (CBC with diff, CMP, Mg, TSH), and cycle #2 pembrolizumab, carboplatin, and Alimta.  I discussed the assessment and treatment plan with the patient.  The patient was provided an opportunity to ask questions and all were answered.  The patient agreed with the plan and demonstrated an understanding of the instructions.  The patient was advised to call back if the symptoms worsen or if the condition fails to improve as anticipated.   Lequita Asal, MD, PhD    02/07/2019, 10:04 AM  I, Molly Dorshimer, am acting as Education administrator for [employer]. Mike Gip, MD, PhD.  I, Almena Hokenson C. Mike Gip, MD, have reviewed the above documentation for accuracy and completeness, and I agree with the above.

## 2019-02-07 ENCOUNTER — Inpatient Hospital Stay (HOSPITAL_BASED_OUTPATIENT_CLINIC_OR_DEPARTMENT_OTHER): Payer: Medicare Other | Admitting: Hematology and Oncology

## 2019-02-07 ENCOUNTER — Inpatient Hospital Stay: Payer: Medicare Other

## 2019-02-07 ENCOUNTER — Encounter: Payer: Self-pay | Admitting: Hematology and Oncology

## 2019-02-07 ENCOUNTER — Other Ambulatory Visit: Payer: Self-pay

## 2019-02-07 VITALS — BP 108/69 | HR 66 | Temp 97.6°F | Resp 17 | Wt 154.1 lb

## 2019-02-07 DIAGNOSIS — C7951 Secondary malignant neoplasm of bone: Secondary | ICD-10-CM

## 2019-02-07 DIAGNOSIS — C3431 Malignant neoplasm of lower lobe, right bronchus or lung: Secondary | ICD-10-CM | POA: Diagnosis not present

## 2019-02-07 DIAGNOSIS — Z5112 Encounter for antineoplastic immunotherapy: Secondary | ICD-10-CM | POA: Diagnosis not present

## 2019-02-07 DIAGNOSIS — G893 Neoplasm related pain (acute) (chronic): Secondary | ICD-10-CM | POA: Diagnosis not present

## 2019-02-07 DIAGNOSIS — Z87891 Personal history of nicotine dependence: Secondary | ICD-10-CM

## 2019-02-07 DIAGNOSIS — I252 Old myocardial infarction: Secondary | ICD-10-CM | POA: Diagnosis not present

## 2019-02-07 DIAGNOSIS — Z5111 Encounter for antineoplastic chemotherapy: Secondary | ICD-10-CM | POA: Diagnosis not present

## 2019-02-07 DIAGNOSIS — Z7982 Long term (current) use of aspirin: Secondary | ICD-10-CM

## 2019-02-07 DIAGNOSIS — Z79899 Other long term (current) drug therapy: Secondary | ICD-10-CM

## 2019-02-07 LAB — CBC WITH DIFFERENTIAL/PLATELET
Abs Immature Granulocytes: 0.03 10*3/uL (ref 0.00–0.07)
Basophils Absolute: 0.1 10*3/uL (ref 0.0–0.1)
Basophils Relative: 1 %
Eosinophils Absolute: 0.4 10*3/uL (ref 0.0–0.5)
Eosinophils Relative: 5 %
HCT: 38.9 % — ABNORMAL LOW (ref 39.0–52.0)
Hemoglobin: 13.1 g/dL (ref 13.0–17.0)
Immature Granulocytes: 0 %
Lymphocytes Relative: 9 %
Lymphs Abs: 0.7 10*3/uL (ref 0.7–4.0)
MCH: 30 pg (ref 26.0–34.0)
MCHC: 33.7 g/dL (ref 30.0–36.0)
MCV: 89 fL (ref 80.0–100.0)
Monocytes Absolute: 0.8 10*3/uL (ref 0.1–1.0)
Monocytes Relative: 10 %
Neutro Abs: 5.8 10*3/uL (ref 1.7–7.7)
Neutrophils Relative %: 75 %
Platelets: 272 10*3/uL (ref 150–400)
RBC: 4.37 MIL/uL (ref 4.22–5.81)
RDW: 13.6 % (ref 11.5–15.5)
WBC: 7.8 10*3/uL (ref 4.0–10.5)
nRBC: 0 % (ref 0.0–0.2)

## 2019-02-07 LAB — COMPREHENSIVE METABOLIC PANEL
ALT: 13 U/L (ref 0–44)
AST: 18 U/L (ref 15–41)
Albumin: 3.9 g/dL (ref 3.5–5.0)
Alkaline Phosphatase: 70 U/L (ref 38–126)
Anion gap: 9 (ref 5–15)
BUN: 9 mg/dL (ref 8–23)
CO2: 23 mmol/L (ref 22–32)
Calcium: 9 mg/dL (ref 8.9–10.3)
Chloride: 103 mmol/L (ref 98–111)
Creatinine, Ser: 0.89 mg/dL (ref 0.61–1.24)
GFR calc Af Amer: >60 mL/min (ref >60)
GFR calc non Af Amer: >60 mL/min (ref >60)
Glucose, Bld: 122 mg/dL — ABNORMAL HIGH (ref 70–99)
Potassium: 4.1 mmol/L (ref 3.5–5.1)
Sodium: 135 mmol/L (ref 135–145)
Total Bilirubin: 1 mg/dL (ref 0.3–1.2)
Total Protein: 7.1 g/dL (ref 6.5–8.1)

## 2019-02-07 LAB — MAGNESIUM: Magnesium: 2.1 mg/dL (ref 1.7–2.4)

## 2019-02-07 MED ORDER — DENOSUMAB 120 MG/1.7ML ~~LOC~~ SOLN
120.0000 mg | Freq: Once | SUBCUTANEOUS | Status: AC
  Administered 2019-02-07: 120 mg via SUBCUTANEOUS

## 2019-02-07 MED ORDER — HEPARIN SOD (PORK) LOCK FLUSH 100 UNIT/ML IV SOLN
500.0000 [IU] | Freq: Once | INTRAVENOUS | Status: AC
  Administered 2019-02-07: 500 [IU] via INTRAVENOUS

## 2019-02-07 MED ORDER — SODIUM CHLORIDE 0.9% FLUSH
10.0000 mL | INTRAVENOUS | Status: DC | PRN
  Administered 2019-02-07: 10 mL via INTRAVENOUS
  Filled 2019-02-07: qty 10

## 2019-02-07 NOTE — Progress Notes (Signed)
Pt here for follow up. Reports he forgot to take Decadron yesterday for his chemo treatment today. Denies any concerns.

## 2019-02-08 ENCOUNTER — Inpatient Hospital Stay: Payer: Medicare Other

## 2019-02-08 VITALS — BP 118/70 | HR 61 | Temp 97.0°F | Resp 16

## 2019-02-08 DIAGNOSIS — Z5111 Encounter for antineoplastic chemotherapy: Secondary | ICD-10-CM | POA: Diagnosis not present

## 2019-02-08 DIAGNOSIS — Z5112 Encounter for antineoplastic immunotherapy: Secondary | ICD-10-CM | POA: Diagnosis not present

## 2019-02-08 DIAGNOSIS — C3431 Malignant neoplasm of lower lobe, right bronchus or lung: Secondary | ICD-10-CM

## 2019-02-08 DIAGNOSIS — C7951 Secondary malignant neoplasm of bone: Secondary | ICD-10-CM | POA: Diagnosis not present

## 2019-02-08 DIAGNOSIS — G893 Neoplasm related pain (acute) (chronic): Secondary | ICD-10-CM | POA: Diagnosis not present

## 2019-02-08 DIAGNOSIS — Z87891 Personal history of nicotine dependence: Secondary | ICD-10-CM | POA: Diagnosis not present

## 2019-02-08 MED ORDER — SODIUM CHLORIDE 0.9 % IV SOLN
Freq: Once | INTRAVENOUS | Status: AC
  Administered 2019-02-08: 10:00:00 via INTRAVENOUS
  Filled 2019-02-08: qty 250

## 2019-02-08 MED ORDER — SODIUM CHLORIDE 0.9 % IV SOLN
Freq: Once | INTRAVENOUS | Status: AC
  Administered 2019-02-08: 11:00:00 via INTRAVENOUS
  Filled 2019-02-08: qty 5

## 2019-02-08 MED ORDER — SODIUM CHLORIDE 0.9 % IV SOLN
200.0000 mg | Freq: Once | INTRAVENOUS | Status: AC
  Administered 2019-02-08: 200 mg via INTRAVENOUS
  Filled 2019-02-08: qty 8

## 2019-02-08 MED ORDER — PALONOSETRON HCL INJECTION 0.25 MG/5ML
0.2500 mg | Freq: Once | INTRAVENOUS | Status: AC
  Administered 2019-02-08: 0.25 mg via INTRAVENOUS

## 2019-02-08 MED ORDER — HEPARIN SOD (PORK) LOCK FLUSH 100 UNIT/ML IV SOLN
500.0000 [IU] | Freq: Once | INTRAVENOUS | Status: AC | PRN
  Administered 2019-02-08: 500 [IU]

## 2019-02-08 MED ORDER — SODIUM CHLORIDE 0.9 % IV SOLN
500.0000 mg/m2 | Freq: Once | INTRAVENOUS | Status: AC
  Administered 2019-02-08: 12:00:00 900 mg via INTRAVENOUS
  Filled 2019-02-08: qty 20

## 2019-02-08 MED ORDER — PALONOSETRON HCL INJECTION 0.25 MG/5ML
INTRAVENOUS | Status: AC
  Filled 2019-02-08: qty 5

## 2019-02-08 MED ORDER — SODIUM CHLORIDE 0.9% FLUSH
10.0000 mL | INTRAVENOUS | Status: DC | PRN
  Administered 2019-02-08: 10:00:00 10 mL
  Filled 2019-02-08: qty 10

## 2019-02-08 MED ORDER — SODIUM CHLORIDE 0.9 % IV SOLN
500.0000 mg | Freq: Once | INTRAVENOUS | Status: AC
  Administered 2019-02-08: 500 mg via INTRAVENOUS
  Filled 2019-02-08: qty 50

## 2019-02-08 MED ORDER — HEPARIN SOD (PORK) LOCK FLUSH 100 UNIT/ML IV SOLN
INTRAVENOUS | Status: AC
  Filled 2019-02-08: qty 5

## 2019-02-09 ENCOUNTER — Other Ambulatory Visit: Payer: Self-pay | Admitting: *Deleted

## 2019-02-09 MED ORDER — OXYCODONE HCL 20 MG PO TABS: 0.5000 | ORAL_TABLET | ORAL | 0 refills | Status: DC | PRN

## 2019-02-09 NOTE — Telephone Encounter (Signed)
Pt requests refill of oxycodone. Last fill was on 6/25.

## 2019-02-10 ENCOUNTER — Other Ambulatory Visit: Payer: Self-pay

## 2019-02-10 ENCOUNTER — Encounter: Payer: Self-pay | Admitting: Emergency Medicine

## 2019-02-10 ENCOUNTER — Telehealth: Payer: Self-pay

## 2019-02-10 ENCOUNTER — Emergency Department
Admission: EM | Admit: 2019-02-10 | Discharge: 2019-02-10 | Disposition: A | Discharge status: Home / Self Care | Payer: Medicare Other | Source: Home / Self Care | Attending: Emergency Medicine | Admitting: Emergency Medicine

## 2019-02-10 DIAGNOSIS — R197 Diarrhea, unspecified: Secondary | ICD-10-CM | POA: Insufficient documentation

## 2019-02-10 DIAGNOSIS — R112 Nausea with vomiting, unspecified: Secondary | ICD-10-CM | POA: Insufficient documentation

## 2019-02-10 DIAGNOSIS — Z85830 Personal history of malignant neoplasm of bone: Secondary | ICD-10-CM | POA: Insufficient documentation

## 2019-02-10 DIAGNOSIS — T451X5A Adverse effect of antineoplastic and immunosuppressive drugs, initial encounter: Secondary | ICD-10-CM | POA: Insufficient documentation

## 2019-02-10 DIAGNOSIS — C7951 Secondary malignant neoplasm of bone: Secondary | ICD-10-CM | POA: Insufficient documentation

## 2019-02-10 DIAGNOSIS — Z79899 Other long term (current) drug therapy: Secondary | ICD-10-CM | POA: Insufficient documentation

## 2019-02-10 DIAGNOSIS — Z85118 Personal history of other malignant neoplasm of bronchus and lung: Secondary | ICD-10-CM | POA: Insufficient documentation

## 2019-02-10 DIAGNOSIS — Z87891 Personal history of nicotine dependence: Secondary | ICD-10-CM | POA: Insufficient documentation

## 2019-02-10 DIAGNOSIS — Z7982 Long term (current) use of aspirin: Secondary | ICD-10-CM | POA: Insufficient documentation

## 2019-02-10 LAB — CBC WITH DIFFERENTIAL/PLATELET
Abs Immature Granulocytes: 0.06 10*3/uL (ref 0.00–0.07)
Basophils Absolute: 0 10*3/uL (ref 0.0–0.1)
Basophils Relative: 0 %
Eosinophils Absolute: 0 10*3/uL (ref 0.0–0.5)
Eosinophils Relative: 0 %
HCT: 40.9 % (ref 39.0–52.0)
Hemoglobin: 13.9 g/dL (ref 13.0–17.0)
Immature Granulocytes: 0 %
Lymphocytes Relative: 4 %
Lymphs Abs: 0.6 10*3/uL — ABNORMAL LOW (ref 0.7–4.0)
MCH: 29.4 pg (ref 26.0–34.0)
MCHC: 34 g/dL (ref 30.0–36.0)
MCV: 86.7 fL (ref 80.0–100.0)
Monocytes Absolute: 0.3 10*3/uL (ref 0.1–1.0)
Monocytes Relative: 2 %
Neutro Abs: 12.6 10*3/uL — ABNORMAL HIGH (ref 1.7–7.7)
Neutrophils Relative %: 94 %
Platelets: 282 10*3/uL (ref 150–400)
RBC: 4.72 MIL/uL (ref 4.22–5.81)
RDW: 13.2 % (ref 11.5–15.5)
WBC: 13.5 10*3/uL — ABNORMAL HIGH (ref 4.0–10.5)
nRBC: 0 % (ref 0.0–0.2)

## 2019-02-10 LAB — URINALYSIS, COMPLETE (UACMP) WITH MICROSCOPIC
Bacteria, UA: NONE SEEN
Bilirubin Urine: NEGATIVE
Glucose, UA: NEGATIVE mg/dL
Hgb urine dipstick: NEGATIVE
Ketones, ur: NEGATIVE mg/dL
Leukocytes,Ua: NEGATIVE
Nitrite: NEGATIVE
Protein, ur: NEGATIVE mg/dL
Specific Gravity, Urine: 1.011 (ref 1.005–1.030)
Squamous Epithelial / HPF: NONE SEEN (ref 0–5)
pH: 7 (ref 5.0–8.0)

## 2019-02-10 LAB — COMPREHENSIVE METABOLIC PANEL
ALT: 23 U/L (ref 0–44)
AST: 28 U/L (ref 15–41)
Albumin: 4 g/dL (ref 3.5–5.0)
Alkaline Phosphatase: 59 U/L (ref 38–126)
Anion gap: 11 (ref 5–15)
BUN: 23 mg/dL (ref 8–23)
CO2: 19 mmol/L — ABNORMAL LOW (ref 22–32)
Calcium: 8.9 mg/dL (ref 8.9–10.3)
Chloride: 107 mmol/L (ref 98–111)
Creatinine, Ser: 0.88 mg/dL (ref 0.61–1.24)
GFR calc Af Amer: >60 mL/min (ref >60)
GFR calc non Af Amer: >60 mL/min (ref >60)
Glucose, Bld: 119 mg/dL — ABNORMAL HIGH (ref 70–99)
Potassium: 3.9 mmol/L (ref 3.5–5.1)
Sodium: 137 mmol/L (ref 135–145)
Total Bilirubin: 1.3 mg/dL — ABNORMAL HIGH (ref 0.3–1.2)
Total Protein: 6.8 g/dL (ref 6.5–8.1)

## 2019-02-10 LAB — LIPASE, BLOOD: Lipase: 99 U/L — ABNORMAL HIGH (ref 11–51)

## 2019-02-10 MED ORDER — LORAZEPAM 1 MG PO TABS: 1.0000 mg | ORAL_TABLET | Freq: Once | ORAL | Status: DC

## 2019-02-10 MED ORDER — LORAZEPAM 2 MG/ML IJ SOLN
INTRAMUSCULAR | Status: AC
  Administered 2019-02-10: 1 mg via INTRAVENOUS
  Filled 2019-02-10: qty 1

## 2019-02-10 MED ORDER — ONDANSETRON HCL 4 MG/2ML IJ SOLN: 4.0000 mg | Freq: Once | INTRAMUSCULAR | Status: DC | PRN

## 2019-02-10 MED ORDER — LORAZEPAM 2 MG/ML IJ SOLN
1.0000 mg | Freq: Once | INTRAMUSCULAR | Status: AC
  Administered 2019-02-10: 1 mg via INTRAVENOUS
  Filled 2019-02-10: qty 1

## 2019-02-10 MED ORDER — SODIUM CHLORIDE 0.9 % IV BOLUS
1000.0000 mL | Freq: Once | INTRAVENOUS | Status: AC
  Administered 2019-02-10: 1000 mL via INTRAVENOUS

## 2019-02-10 MED ORDER — LORAZEPAM 2 MG/ML IJ SOLN
1.0000 mg | Freq: Once | INTRAMUSCULAR | Status: AC
  Administered 2019-02-10: 1 mg via INTRAVENOUS

## 2019-02-10 MED ORDER — ONDANSETRON 4 MG PO TBDP: 4.0000 mg | ORAL_TABLET | Freq: Three times a day (TID) | ORAL | 0 refills | Status: DC | PRN

## 2019-02-10 MED ORDER — ONDANSETRON HCL 4 MG/2ML IJ SOLN
4.0000 mg | Freq: Once | INTRAMUSCULAR | Status: AC
  Administered 2019-02-10: 4 mg via INTRAVENOUS
  Filled 2019-02-10: qty 2

## 2019-02-10 MED ORDER — TRAZODONE HCL 100 MG PO TABS
100.0000 mg | ORAL_TABLET | Freq: Every day | ORAL | Status: DC
  Administered 2019-02-10: 22:00:00 100 mg via ORAL
  Filled 2019-02-10: qty 1

## 2019-02-10 MED ORDER — TRAZODONE HCL 50 MG PO TABS: 100.0000 mg | ORAL_TABLET | Freq: Every day | ORAL | 0 refills | Status: DC

## 2019-02-10 NOTE — Telephone Encounter (Signed)
Inbound call received from [facility] stating patient has been nauseated all morning with chills, body aches, and unsure of fever. Patient last received Chemo 2 days ago and was unsure how he was supposed to be feeling and if this was normal so did not feel the need to call. Talked to patient and confirmed symptoms with also adding restless legs, fatigue, and vomited x 1 while on phone. Patient reports he has been taking Decadron as directed and took Zofran 8 MG today. Patient reports he felt fine yesterday and this hit him all of a sudden this AM. Informed patient Dr. Mike Gip would like for him to be seen in ED tonight and in clinic tomorrow. Patient was hesitant d/t transportation. Informed patient EMS could be used if needed. Patient states he would contact Christine to inquire on transportation and encouraged patient to at least call office in the AM to give update if he does not want to be seen. Contacted Christine and she confirmed patient is going to go to ED as she has arranged transportation.

## 2019-02-10 NOTE — ED Triage Notes (Signed)
First RN Note: Pt presents to ED with c/o N/V x several days. Dr. Gerrit Friends called regarding patient, states had 2 medications several days ago to treat metastatic cancer and since then has had constant N/V and pain with decreased PO intake.

## 2019-02-10 NOTE — ED Notes (Signed)
pts contact updated at this time

## 2019-02-10 NOTE — ED Provider Notes (Signed)
[facility] Emergency Department Provider Note  Time seen: 6:32 PM  I have reviewed the triage vital signs and the nursing notes.   HISTORY  Chief Complaint Emesis and Weakness   HPI Jimmy West is a 68 y.o. male with a past medical history of lung cancer, presents to the emergency department for nausea vomiting diarrhea not feeling well.  According to the patient 2 days ago he got his first round of chemotherapy, yesterday felt quite fatigued, this morning he woke with nausea vomiting and diarrhea.  States he feels very dehydrated and rundown today.  Denies any fever.  No abdominal pain or chest pain.   Past Medical History:  Diagnosis Date  . Bulging lumbar disc   . Cancer ([place])    stage 4 lung cancer  . Heart attack [facility])     Patient Active Problem List   Diagnosis Date Noted  . Mild protein-calorie malnutrition ([place]) 10/10/2018  . Weight loss 09/20/2018  . Elevated bilirubin 07/14/2018  . Encounter for antineoplastic chemotherapy 05/24/2018  . Encounter for antineoplastic immunotherapy 05/24/2018  . Goals of care, counseling/discussion 05/17/2018  . Cancer-related pain 05/01/2018  . 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 Primary cancer of right lower lobe of lung ([place]) 04/26/2018  . Chronic midline low back pain without sciatica 04/26/2018    Past Surgical History:  Procedure Laterality Date  . CARDIAC CATHETERIZATION     two stents  . KNEE SURGERY Left   . PORTA CATH INSERTION N/A 05/21/2018   Procedure: PORTA CATH INSERTION;  Surgeon: Algernon Huxley, MD;  Location: [facility];  Service: Cardiovascular;  Laterality: N/A;    Prior to Admission medications   Medication Sig Start Date End Date Taking? Authorizing Provider  acetaminophen (TYLENOL) 500 MG tablet Take 500 mg by mouth 3 (three) times daily as needed.    [provider]  aspirin EC 81 MG tablet Take 81 mg by mouth daily.    [provider]  Calcium  600-400 MG-UNIT CHEW Chew 2 tablets by mouth daily.    [provider]  dexamethasone (DECADRON) 4 MG tablet Take 1 tab two times a day the day before Alimta chemo, then take 2 tabs once a day for 3 days starting the day after chemo. 01/31/19   Lequita Asal, MD  fentaNYL (DURAGESIC) 75 MCG/HR Place 1 patch onto the skin every 3 (three) days. 01/10/19   Borders, Kirt Boys, NP  FLUoxetine (PROZAC) 20 MG capsule Take 1 capsule (20 mg total) by mouth daily. 12/10/18   Borders, Kirt Boys, NP  folic acid (FOLVITE) 1 MG tablet Take 1 tablet (1 mg total) by mouth daily. Start 5-7 days before Alimta chemotherapy. Continue until 21 days after Alimta completed. 01/31/19   Lequita Asal, MD  ibuprofen (ADVIL,MOTRIN) 200 MG tablet Take 200 mg by mouth every 6 (six) hours as needed.    [provider]  naloxone [facility]) nasal spray 4 mg/0.1 mL 1 spray by nose in the event of opioid overdose. Call EMS (911) immediately if medication is used. Patient not taking: Reported on 10/13/2018 07/27/18   Borders, Kirt Boys, NP  Oxycodone HCl 20 MG TABS Take 0.5-1 tablets (10-20 mg total) by mouth every 4 (four) hours as needed (for severe pain). 02/09/19   Borders, Kirt Boys, NP  pembrolizumab (KEYTRUDA) 100 MG/4ML SOLN Inject 2 mg/kg into the vein every 30 (thirty) days.     [provider]  polyethylene glycol (MIRALAX /  GLYCOLAX) packet Take 17 g by mouth daily.    [provider]  senna (SENOKOT) 8.6 MG tablet Take 1 tablet by mouth as needed.     [provider]    No Known Allergies  Family History  Problem Relation Age of Onset  . Heart failure Father   . Cancer Maternal Aunt   . Heart failure Maternal Uncle   . Dementia Paternal Grandmother   . Cancer Maternal Aunt     Social History Social History   Tobacco Use  . Smoking status: Former Smoker    Packs/day: 1.50    Years: 15.00    Pack years: 22.50    Types: Cigarettes    Quit date: 11/03/2003    Years  since quitting: 15.2  . Smokeless tobacco: Never Used  Substance Use Topics  . Alcohol use: Yes    Alcohol/week: 15.0 standard drinks    Types: 15 Shots of liquor per week  . Drug use: No    Review of Systems Constitutional: Negative for fever.  Positive for generalized weakness. Cardiovascular: Negative for chest pain. Respiratory: Negative for shortness of breath. Gastrointestinal: Negative for abdominal pain.  Positive for nausea vomiting diarrhea. Musculoskeletal: Negative for musculoskeletal complaints Skin: Negative for skin complaints  Neurological: Negative for headache All other ROS negative  ____________________________________________   PHYSICAL EXAM:  VITAL SIGNS: ED Triage Vitals  Enc Vitals Group     BP 02/10/19 1811 (!) 151/77     Pulse Rate 02/10/19 1811 61     Resp 02/10/19 1811 (!) 24     Temp 02/10/19 1811 98.4 F (36.9 C)     Temp src --      SpO2 02/10/19 1811 100 %     Weight 02/10/19 1812 150 lb (68 kg)     Height 02/10/19 1812 5\' 6"  (1.676 m)     Head Circumference --      Peak Flow --      Pain Score 02/10/19 1811 0     Pain Loc --      Pain Edu? --      Excl. in [place]? --     Constitutional: Alert and oriented.  Appears nauseated. Eyes: Normal exam ENT      Head: Normocephalic and atraumatic      Mouth/Throat: dry mucous membranes Cardiovascular: Normal rate, regular rhythm. No murmur Respiratory: Normal respiratory effort without tachypnea nor retractions. Breath sounds are clear  Gastrointestinal: Soft and nontender. No distention.   Musculoskeletal: Nontender with normal range of motion in all extremities Neurologic:  Normal speech and language. No gross focal neurologic deficits  Skin:  Skin is warm, dry and intact.  Psychiatric: Mood and affect are normal.    INITIAL IMPRESSION / ASSESSMENT AND PLAN / ED COURSE  Pertinent labs & imaging results that were available during my care of the patient were reviewed by me and considered  in my medical decision making (see chart for details).   Patient presents emergency department for nausea vomiting diarrhea after his first round of chemotherapy 2 days ago.  Highly suspect nausea vomiting diarrhea due to chemotherapy, differential this time would include electrolyte or metabolic abnormality, hypokalemia, dehydration.  We will check labs, IV hydrate, treat nausea and restlessness.  Patient agreeable to plan of care.  Denies any abdominal pain, benign abdominal exam.  Patient was still feeling quite restless, given another dose of Ativan with minimal result.  Denies any further nausea but continued to feel restless.  Gave  the patient a dose of trazodone. Patient states he was able to get some rest with the trazodone, though wishes to go home.  We will discharge the patient home with a prescription for trazodone, nausea medication.  I discussed strict return precautions.  Patient agreeable to plan of care otherwise will follow-up with his oncologist.  Durwin Nora Weight was evaluated in Emergency Department on 02/10/2019 for the symptoms described in the history of present illness. He was evaluated in the context of the global COVID-19 pandemic, which necessitated consideration that the patient might be at risk for infection with the SARS-CoV-2 virus that causes COVID-19. Institutional protocols and algorithms that pertain to the evaluation of patients at risk for COVID-19 are in a state of rapid change based on information released by regulatory bodies including the CDC and federal and state organizations. These policies and algorithms were followed during the patient's care in the ED.  ____________________________________________   FINAL CLINICAL IMPRESSION(S) / ED DIAGNOSES  Nausea vomiting diarrhea related to chemotherapy   Harvest Dark, MD 02/10/19 2249

## 2019-02-10 NOTE — ED Triage Notes (Signed)
Pt in via POV, received first chemo treatment two days ago, presents today with severe weakness, N/V/D.  Advised to be seen in ED per oncologist.  Pt appears uncomfortable.  Vitals WDL.

## 2019-02-11 ENCOUNTER — Other Ambulatory Visit: Payer: Self-pay

## 2019-02-11 ENCOUNTER — Ambulatory Visit
Admission: RE | Admit: 2019-02-11 | Discharge: 2019-02-11 | Disposition: A | Discharge status: Home / Self Care | Payer: Medicare Other | Source: Ambulatory Visit | Attending: Nurse Practitioner | Admitting: Nurse Practitioner

## 2019-02-11 ENCOUNTER — Inpatient Hospital Stay (HOSPITAL_BASED_OUTPATIENT_CLINIC_OR_DEPARTMENT_OTHER): Payer: Medicare Other | Admitting: Nurse Practitioner

## 2019-02-11 ENCOUNTER — Encounter: Payer: Self-pay | Admitting: Nurse Practitioner

## 2019-02-11 ENCOUNTER — Inpatient Hospital Stay: Payer: Medicare Other

## 2019-02-11 ENCOUNTER — Inpatient Hospital Stay
Admission: AD | Admit: 2019-02-11 | Discharge: 2019-02-13 | DRG: 392 | Disposition: A | Discharge status: Home / Self Care | Payer: Medicare Other | Source: Ambulatory Visit | Attending: Specialist | Admitting: Specialist

## 2019-02-11 VITALS — BP 122/69 | HR 67 | Temp 98.4°F | Resp 16

## 2019-02-11 VITALS — BP 93/64 | HR 72 | Temp 98.2°F

## 2019-02-11 DIAGNOSIS — R112 Nausea with vomiting, unspecified: Secondary | ICD-10-CM

## 2019-02-11 DIAGNOSIS — Z20828 Contact with and (suspected) exposure to other viral communicable diseases: Secondary | ICD-10-CM | POA: Diagnosis present

## 2019-02-11 DIAGNOSIS — Z79899 Other long term (current) drug therapy: Secondary | ICD-10-CM | POA: Diagnosis not present

## 2019-02-11 DIAGNOSIS — Z87891 Personal history of nicotine dependence: Secondary | ICD-10-CM | POA: Diagnosis not present

## 2019-02-11 DIAGNOSIS — Z7982 Long term (current) use of aspirin: Secondary | ICD-10-CM

## 2019-02-11 DIAGNOSIS — I252 Old myocardial infarction: Secondary | ICD-10-CM

## 2019-02-11 DIAGNOSIS — G893 Neoplasm related pain (acute) (chronic): Secondary | ICD-10-CM

## 2019-02-11 DIAGNOSIS — T451X5A Adverse effect of antineoplastic and immunosuppressive drugs, initial encounter: Secondary | ICD-10-CM | POA: Diagnosis present

## 2019-02-11 DIAGNOSIS — R1110 Vomiting, unspecified: Secondary | ICD-10-CM | POA: Diagnosis not present

## 2019-02-11 DIAGNOSIS — C3431 Malignant neoplasm of lower lobe, right bronchus or lung: Secondary | ICD-10-CM

## 2019-02-11 DIAGNOSIS — E876 Hypokalemia: Secondary | ICD-10-CM | POA: Diagnosis not present

## 2019-02-11 DIAGNOSIS — Z7951 Long term (current) use of inhaled steroids: Secondary | ICD-10-CM

## 2019-02-11 DIAGNOSIS — C3491 Malignant neoplasm of unspecified part of right bronchus or lung: Secondary | ICD-10-CM | POA: Diagnosis not present

## 2019-02-11 DIAGNOSIS — R197 Diarrhea, unspecified: Secondary | ICD-10-CM

## 2019-02-11 DIAGNOSIS — R17 Unspecified jaundice: Secondary | ICD-10-CM | POA: Diagnosis present

## 2019-02-11 DIAGNOSIS — I2510 Atherosclerotic heart disease of native coronary artery without angina pectoris: Secondary | ICD-10-CM | POA: Diagnosis present

## 2019-02-11 DIAGNOSIS — R1032 Left lower quadrant pain: Secondary | ICD-10-CM

## 2019-02-11 DIAGNOSIS — K529 Noninfective gastroenteritis and colitis, unspecified: Secondary | ICD-10-CM | POA: Diagnosis not present

## 2019-02-11 DIAGNOSIS — K219 Gastro-esophageal reflux disease without esophagitis: Secondary | ICD-10-CM | POA: Diagnosis present

## 2019-02-11 DIAGNOSIS — R109 Unspecified abdominal pain: Secondary | ICD-10-CM | POA: Diagnosis present

## 2019-02-11 DIAGNOSIS — Z955 Presence of coronary angioplasty implant and graft: Secondary | ICD-10-CM

## 2019-02-11 DIAGNOSIS — Z791 Long term (current) use of non-steroidal anti-inflammatories (NSAID): Secondary | ICD-10-CM | POA: Diagnosis not present

## 2019-02-11 DIAGNOSIS — F329 Major depressive disorder, single episode, unspecified: Secondary | ICD-10-CM | POA: Diagnosis present

## 2019-02-11 LAB — CBC WITH DIFFERENTIAL/PLATELET
Abs Immature Granulocytes: 0.06 10*3/uL (ref 0.00–0.07)
Basophils Absolute: 0 10*3/uL (ref 0.0–0.1)
Basophils Relative: 0 %
Eosinophils Absolute: 0 10*3/uL (ref 0.0–0.5)
Eosinophils Relative: 0 %
HCT: 40.5 % (ref 39.0–52.0)
Hemoglobin: 13.7 g/dL (ref 13.0–17.0)
Immature Granulocytes: 1 %
Lymphocytes Relative: 11 %
Lymphs Abs: 1.3 10*3/uL (ref 0.7–4.0)
MCH: 29.5 pg (ref 26.0–34.0)
MCHC: 33.8 g/dL (ref 30.0–36.0)
MCV: 87.1 fL (ref 80.0–100.0)
Monocytes Absolute: 0.3 10*3/uL (ref 0.1–1.0)
Monocytes Relative: 3 %
Neutro Abs: 10.3 10*3/uL — ABNORMAL HIGH (ref 1.7–7.7)
Neutrophils Relative %: 85 %
Platelets: 286 10*3/uL (ref 150–400)
RBC: 4.65 MIL/uL (ref 4.22–5.81)
RDW: 13 % (ref 11.5–15.5)
WBC: 11.9 10*3/uL — ABNORMAL HIGH (ref 4.0–10.5)
nRBC: 0 % (ref 0.0–0.2)

## 2019-02-11 LAB — COMPREHENSIVE METABOLIC PANEL
ALT: 20 U/L (ref 0–44)
AST: 25 U/L (ref 15–41)
Albumin: 3.9 g/dL (ref 3.5–5.0)
Alkaline Phosphatase: 53 U/L (ref 38–126)
Anion gap: 10 (ref 5–15)
BUN: 20 mg/dL (ref 8–23)
CO2: 22 mmol/L (ref 22–32)
Calcium: 8.7 mg/dL — ABNORMAL LOW (ref 8.9–10.3)
Chloride: 105 mmol/L (ref 98–111)
Creatinine, Ser: 0.88 mg/dL (ref 0.61–1.24)
GFR calc Af Amer: >60 mL/min (ref >60)
GFR calc non Af Amer: >60 mL/min (ref >60)
Glucose, Bld: 100 mg/dL — ABNORMAL HIGH (ref 70–99)
Potassium: 3.2 mmol/L — ABNORMAL LOW (ref 3.5–5.1)
Sodium: 137 mmol/L (ref 135–145)
Total Bilirubin: 1.6 mg/dL — ABNORMAL HIGH (ref 0.3–1.2)
Total Protein: 7.1 g/dL (ref 6.5–8.1)

## 2019-02-11 LAB — AMYLASE: Amylase: 105 U/L — ABNORMAL HIGH (ref 28–100)

## 2019-02-11 LAB — LIPASE, BLOOD: Lipase: 87 U/L — ABNORMAL HIGH (ref 11–51)

## 2019-02-11 LAB — SARS CORONAVIRUS 2 BY RT PCR (HOSPITAL ORDER, PERFORMED IN ~~LOC~~ HOSPITAL LAB): SARS Coronavirus 2: NEGATIVE

## 2019-02-11 LAB — PROCALCITONIN: Procalcitonin: <0.1 ng/mL

## 2019-02-11 MED ORDER — FOLIC ACID 1 MG PO TABS
1.0000 mg | ORAL_TABLET | Freq: Every day | ORAL | Status: DC
  Administered 2019-02-12 – 2019-02-13 (×2): 1 mg via ORAL
  Filled 2019-02-11 (×2): qty 1

## 2019-02-11 MED ORDER — VANCOMYCIN HCL 1.5 G IV SOLR
1500.0000 mg | Freq: Once | INTRAVENOUS | Status: AC
  Administered 2019-02-12: 1500 mg via INTRAVENOUS
  Filled 2019-02-11: qty 1500

## 2019-02-11 MED ORDER — POLYETHYLENE GLYCOL 3350 17 G PO PACK
17.0000 g | PACK | Freq: Every day | ORAL | Status: DC
  Filled 2019-02-11: qty 1

## 2019-02-11 MED ORDER — SODIUM CHLORIDE 0.9 % IV SOLN
INTRAVENOUS | Status: DC
  Administered 2019-02-11: 12:00:00 via INTRAVENOUS
  Filled 2019-02-11 (×2): qty 250

## 2019-02-11 MED ORDER — MORPHINE SULFATE (PF) 2 MG/ML IV SOLN
INTRAVENOUS | Status: AC
  Filled 2019-02-11: qty 2

## 2019-02-11 MED ORDER — MORPHINE SULFATE 2 MG/ML IJ SOLN
4.0000 mg | Freq: Once | INTRAMUSCULAR | Status: AC
  Administered 2019-02-11: 4 mg via INTRAVENOUS

## 2019-02-11 MED ORDER — METRONIDAZOLE IN NACL 5-0.79 MG/ML-% IV SOLN
500.0000 mg | Freq: Three times a day (TID) | INTRAVENOUS | Status: DC
  Administered 2019-02-11 – 2019-02-12 (×2): 500 mg via INTRAVENOUS
  Filled 2019-02-11 (×4): qty 100

## 2019-02-11 MED ORDER — ACETAMINOPHEN 500 MG PO TABS
500.0000 mg | ORAL_TABLET | Freq: Four times a day (QID) | ORAL | Status: DC | PRN
  Administered 2019-02-12: 500 mg via ORAL
  Filled 2019-02-11: qty 1

## 2019-02-11 MED ORDER — ONDANSETRON HCL 4 MG/2ML IJ SOLN
4.0000 mg | Freq: Four times a day (QID) | INTRAMUSCULAR | Status: DC | PRN
  Administered 2019-02-11 – 2019-02-12 (×2): 4 mg via INTRAVENOUS
  Filled 2019-02-11 (×2): qty 2

## 2019-02-11 MED ORDER — PIPERACILLIN-TAZOBACTAM 3.375 G IVPB
3.3750 g | Freq: Three times a day (TID) | INTRAVENOUS | Status: DC
  Administered 2019-02-11: 19:00:00 3.375 g via INTRAVENOUS
  Filled 2019-02-11: qty 50

## 2019-02-11 MED ORDER — OXYCODONE HCL 5 MG PO TABS
2.5000 mg | ORAL_TABLET | ORAL | Status: DC | PRN
  Administered 2019-02-12: 5 mg via ORAL
  Filled 2019-02-11: qty 1

## 2019-02-11 MED ORDER — ONDANSETRON HCL 4 MG/2ML IJ SOLN
4.0000 mg | Freq: Once | INTRAMUSCULAR | Status: AC
  Administered 2019-02-11: 4 mg via INTRAVENOUS

## 2019-02-11 MED ORDER — TRAZODONE HCL 50 MG PO TABS
100.0000 mg | ORAL_TABLET | Freq: Every day | ORAL | Status: DC
  Administered 2019-02-11 – 2019-02-12 (×2): 100 mg via ORAL
  Filled 2019-02-11 (×2): qty 2

## 2019-02-11 MED ORDER — PANTOPRAZOLE SODIUM 40 MG IV SOLR
40.0000 mg | Freq: Two times a day (BID) | INTRAVENOUS | Status: DC
  Administered 2019-02-11 – 2019-02-13 (×4): 40 mg via INTRAVENOUS
  Filled 2019-02-11 (×4): qty 40

## 2019-02-11 MED ORDER — SENNA 8.6 MG PO TABS: 1.0000 | ORAL_TABLET | Freq: Every evening | ORAL | Status: DC | PRN

## 2019-02-11 MED ORDER — FENTANYL 75 MCG/HR TD PT72
1.0000 | MEDICATED_PATCH | TRANSDERMAL | Status: DC
  Administered 2019-02-11: 18:00:00 1 via TRANSDERMAL

## 2019-02-11 MED ORDER — IOHEXOL 300 MG/ML  SOLN
100.0000 mL | Freq: Once | INTRAMUSCULAR | Status: AC | PRN
  Administered 2019-02-11: 100 mL via INTRAVENOUS

## 2019-02-11 MED ORDER — FLUOXETINE HCL 20 MG PO CAPS
20.0000 mg | ORAL_CAPSULE | Freq: Every day | ORAL | Status: DC
  Administered 2019-02-12 – 2019-02-13 (×2): 20 mg via ORAL
  Filled 2019-02-11 (×3): qty 1

## 2019-02-11 MED ORDER — SODIUM CHLORIDE 0.9 % IV SOLN
INTRAVENOUS | Status: DC
  Administered 2019-02-11 – 2019-02-13 (×4): via INTRAVENOUS

## 2019-02-11 MED ORDER — ONDANSETRON HCL 4 MG/2ML IJ SOLN
INTRAMUSCULAR | Status: AC
  Filled 2019-02-11: qty 2

## 2019-02-11 MED ORDER — CALCIUM CITRATE-VITAMIN D 500-500 MG-UNIT PO CHEW
2.0000 | CHEWABLE_TABLET | Freq: Every day | ORAL | Status: DC
  Administered 2019-02-13: 2 via ORAL
  Filled 2019-02-11 (×3): qty 2

## 2019-02-11 MED ORDER — SODIUM CHLORIDE 0.9 % IV SOLN
Freq: Once | INTRAVENOUS | Status: AC
  Administered 2019-02-11: 11:00:00 via INTRAVENOUS
  Filled 2019-02-11: qty 250

## 2019-02-11 MED ORDER — SODIUM CHLORIDE 0.9% FLUSH
10.0000 mL | Freq: Once | INTRAVENOUS | Status: AC
  Administered 2019-02-11: 10 mL via INTRAVENOUS
  Filled 2019-02-11: qty 10

## 2019-02-11 MED ORDER — MORPHINE SULFATE (PF) 2 MG/ML IV SOLN
2.0000 mg | INTRAVENOUS | Status: DC | PRN
  Administered 2019-02-11: 19:00:00 2 mg via INTRAVENOUS
  Filled 2019-02-11: qty 1

## 2019-02-11 NOTE — Progress Notes (Addendum)
Pt's port accesed, CBCw/diff, CMP, amylase, lipase drawn per orders. 1L IVF bolus initiated. VSS at this time. 4mg  of morphine and 4mg  of zofran given IV for abdominal pain and nausea.  Pt VS remain stable. Pt placed on NS @ 75 per NP Lauren order. Pt reports abdominal pain improved from a 5 to a 3. Pt was unable to tolerate PO CT contrast. CT tech notified. Pt taken to radiology for CT then to be admitted to inpt status.

## 2019-02-11 NOTE — H&P (Addendum)
[place] [facility] [place] NAME: Jimmy West    MR#:  [id number]  DATE OF BIRTH:  1951/05/04  DATE OF ADMISSION:  02/11/2019  PRIMARY CARE PHYSICIAN: [name], NP   REQUESTING/REFERRING PHYSICIAN: Nolon Stalls, MD  CHIEF COMPLAINT:  No chief complaint on file. Abdominal pain, nausea and vomiting  HISTORY OF PRESENT ILLNESS:   68 year old male with past medical history of metastatic lung cancer currently on chemo, MI, CAD status post stent and bulging disc presenting as a direct admit from oncology clinic with complaints of nausea, vomiting, diarrhea and abdominal pain.   Per his oncologist note, patient received his first cycle of carboplatin and Alimta with continuation of Keytruda on 02/08/2019. Patient called his oncology on 02/10/2019 complaining of being nauseated all morning with chills, body aches, fever nausea and vomiting, restless leg and fatigue following chemo treatment on 02/08/2019.  He reports that he took Decadron as directed and Zofran with no improvement.  His oncologist advised him to be seen in the ED for evaluation of symptoms.  He was seen in the ED on 02/10/2019. His symptoms were thought to be related to his chemo treatment therefore he was treated with IV fluids, Ativan and discharged to follow-up with his oncologist.  Per his oncologist, he called today stating that he has had persistent nausea and vomiting, diarrhea with new onset left-sided abdominal pain. Given significant change in performance status for patient (normally active, independent, drives, asymptomatic and now weak, in wheelchair and he lives alone), his oncologist recommended admission to hospital for further management.  On arrival to the ED, he was afebrile with blood pressure 151/77 mm Hg and pulse rate 61 beats/min. There were no focal neurological deficits; he was alert but appeared to be in discomfort. He was noted to be in fetal position and  actively vomiting.  Labs performed today revealed potassium of 3.2, calcium 8.7, bilirubin 1.6, WBC 11.9 improved from yesterday's 13.5, lipase 87 improved from yesterday's 99.  CT abdomen and pelvis showed possible colitis.  PAST MEDICAL HISTORY:   Past Medical History:  Diagnosis Date   Bulging lumbar disc    Cancer ([place])    stage 4 lung cancer   Heart attack ([place])     PAST SURGICAL HISTORY:   Past Surgical History:  Procedure Laterality Date   CARDIAC CATHETERIZATION     two stents   KNEE SURGERY Left    PORTA CATH INSERTION N/A 05/21/2018   Procedure: PORTA CATH INSERTION;  Surgeon: Algernon Huxley, MD;  Location: [facility];  Service: Cardiovascular;  Laterality: N/A;    SOCIAL HISTORY:   Social History   Tobacco Use   Smoking status: Former Smoker    Packs/day: 1.50    Years: 15.00    Pack years: 22.50    Types: Cigarettes    Quit date: 11/03/2003    Years since quitting: 15.2   Smokeless tobacco: Never Used  Substance Use Topics   Alcohol use: Yes    Alcohol/week: 15.0 standard drinks    Types: 15 Shots of liquor per week    FAMILY HISTORY:   Family History  Problem Relation Age of Onset   Heart failure Father    Cancer Maternal Aunt    Heart failure Maternal Uncle    Dementia Paternal Grandmother    Cancer Maternal Aunt     DRUG ALLERGIES:  No Known Allergies  REVIEW OF SYSTEMS:   Review of Systems  Constitutional: Positive for chills and malaise/fatigue. Negative for fever and weight loss.  HENT: Positive for hearing loss. Negative for congestion and sore throat.   Eyes: Negative for blurred vision and double vision.  Respiratory: Negative for cough, shortness of breath and wheezing.   Cardiovascular: Negative for chest pain, palpitations, orthopnea and leg swelling.  Gastrointestinal: Positive for abdominal pain, diarrhea, nausea and vomiting.  Genitourinary: Negative for dysuria and urgency.  Musculoskeletal: Negative for  myalgias.  Skin: Negative for rash.  Neurological: Positive for weakness. Negative for dizziness, sensory change, speech change, focal weakness and headaches.  Psychiatric/Behavioral: Negative for depression.   MEDICATIONS AT HOME:   Prior to Admission medications   Medication Sig Start Date End Date Taking? Authorizing Provider  acetaminophen (TYLENOL) 500 MG tablet Take 500 mg by mouth 3 (three) times daily as needed.    [provider]  aspirin EC 81 MG tablet Take 81 mg by mouth daily.    [provider]  Calcium 600-400 MG-UNIT CHEW Chew 2 tablets by mouth daily.    [provider]  dexamethasone (DECADRON) 4 MG tablet Take 1 tab two times a day the day before Alimta chemo, then take 2 tabs once a day for 3 days starting the day after chemo. 01/31/19   Lequita Asal, MD  fentaNYL (DURAGESIC) 75 MCG/HR Place 1 patch onto the skin every 3 (three) days. 01/10/19   Borders, Kirt Boys, NP  FLUoxetine (PROZAC) 20 MG capsule Take 1 capsule (20 mg total) by mouth daily. 12/10/18   Borders, Kirt Boys, NP  folic acid (FOLVITE) 1 MG tablet Take 1 tablet (1 mg total) by mouth daily. Start 5-7 days before Alimta chemotherapy. Continue until 21 days after Alimta completed. 01/31/19   Lequita Asal, MD  ibuprofen (ADVIL,MOTRIN) 200 MG tablet Take 200 mg by mouth every 6 (six) hours as needed.    [provider]  naloxone [facility]) nasal spray 4 mg/0.1 mL 1 spray by nose in the event of opioid overdose. Call EMS (911) immediately if medication is used. Patient not taking: Reported on 10/13/2018 07/27/18   Borders, Kirt Boys, NP  ondansetron (ZOFRAN ODT) 4 MG disintegrating tablet Take 1 tablet (4 mg total) by mouth every 8 (eight) hours as needed for nausea or vomiting. 02/10/19   Harvest Dark, MD  Oxycodone HCl 20 MG TABS Take 0.5-1 tablets (10-20 mg total) by mouth every 4 (four) hours as needed (for severe pain). 02/09/19   Borders, Kirt Boys, NP  pembrolizumab  (KEYTRUDA) 100 MG/4ML SOLN Inject 2 mg/kg into the vein every 30 (thirty) days.     [provider]  polyethylene glycol (MIRALAX / GLYCOLAX) packet Take 17 g by mouth daily.    [provider]  senna (SENOKOT) 8.6 MG tablet Take 1 tablet by mouth as needed.     [provider]  traZODone (DESYREL) 50 MG tablet Take 2 tablets (100 mg total) by mouth at bedtime. 02/10/19   Harvest Dark, MD      VITAL SIGNS:  Blood pressure (!) 143/80, pulse 63, temperature 98.5 F (36.9 C), temperature source Oral, resp. rate 18, SpO2 97 %.  PHYSICAL EXAMINATION:   Physical Exam  GENERAL:  68 y.o.-year-old patient lying in the bed ill appearing EYES: Pupils equal, round, reactive to light and accommodation. No scleral icterus. Extraocular muscles intact.  HEENT: Head atraumatic, normocephalic. Oropharynx and nasopharynx clear.  NECK:  Supple, no jugular venous distention. No thyroid enlargement, no tenderness.  LUNGS:  Normal breath sounds bilaterally, no wheezing, rales,rhonchi or crepitation. No use of accessory muscles of respiration.  CARDIOVASCULAR: S1, S2 normal. No murmurs, rubs, or gallops.  ABDOMEN: Soft, abdominal tenderness, guarding in the left lower quadrant nondistended. Bowel sounds present. No organomegaly or mass.  EXTREMITIES: No pedal edema, cyanosis, or clubbing. No rash or lesions. + pedal pulses MUSCULOSKELETAL: Normal bulk, and power was 5+ grip and elbow, knee, and ankle flexion and extension bilaterally.  NEUROLOGIC:Alert and oriented x 3. CN 2-12 intact. Sensation to light touch and cold stimuli intact bilaterally. Babinski is downgoing. DTR's (biceps, patellar, and achilles) 2+ and symmetric throughout. Gait not tested due to safety concern. PSYCHIATRIC: The patient is alert and oriented x 3.  SKIN: No obvious rash, lesion, or ulcer.   DATA REVIEWED:  LABORATORY PANEL:   CBC Recent Labs  Lab 02/11/19 1104  WBC 11.9*  HGB 13.7  HCT 40.5    PLT 286   ------------------------------------------------------------------------------------------------------------------  Chemistries  Recent Labs  Lab 02/07/19 0927  02/11/19 1104  NA 135   < > 137  K 4.1   < > 3.2*  CL 103   < > 105  CO2 23   < > 22  GLUCOSE 122*   < > 100*  BUN 9   < > 20  CREATININE 0.89   < > 0.88  CALCIUM 9.0   < > 8.7*  MG 2.1  --   --   AST 18   < > 25  ALT 13   < > 20  ALKPHOS 70   < > 53  BILITOT 1.0   < > 1.6*   < > = values in this interval not displayed.   ------------------------------------------------------------------------------------------------------------------  Cardiac Enzymes No results for input(s): TROPONINI in the last 168 hours. ------------------------------------------------------------------------------------------------------------------  RADIOLOGY:  Ct Abdomen Pelvis W Contrast  Result Date: 02/11/2019 CLINICAL DATA:  Abdominal pain left upper and lower quadrant with nausea, vomiting and diarrhea. History of metastatic lung cancer on chemotherapy. EXAM: CT ABDOMEN AND PELVIS WITH CONTRAST TECHNIQUE: Multidetector CT imaging of the abdomen and pelvis was performed using the standard protocol following bolus administration of intravenous contrast. CONTRAST:  169mL OMNIPAQUE IOHEXOL 300 MG/ML  SOLN COMPARISON:  01/28/2019 and 11/02/2018 FINDINGS: Lower chest: Partial visualization of patient's known necrotic right lower lobe mass with scarring over the posterior right base. Calcified plaque over the right coronary artery. Right-sided Port-A-Cath with tip over the right atrium unchanged. Hepatobiliary: 2 tiny subcentimeter liver hypodensities unchanged and too small to characterize but likely cysts. Gallbladder and biliary tree are normal. Pancreas: Normal. Spleen: Normal. Adrenals/Urinary Tract: Stable 1.5 cm left adrenal nodule. Right adrenal gland is normal. Kidneys are normal in size without hydronephrosis or nephrolithiasis.  Several bilateral renal cysts. Ureters and bladder are normal. Stomach/Bowel: Stomach is normal. Mild wall thickening of the duodenum. Possible mild wall thickening over several proximal jejunal loops. Appendix is normal. Minimal diverticulosis of the colon. Vascular/Lymphatic: Mild calcified plaque over the abdominal aorta which is normal in caliber. No adenopathy. Reproductive: Normal. Other: No significant free fluid. Musculoskeletal: Degenerative change of the spine. Mild degenerative change of the hips. IMPRESSION: Mild wall thickening involving the duodenum and possibly several proximal jejunal loops as findings may be due to duodenitis or regional enteritis of infectious or inflammatory nature. Partially visualized known large necrotic right lower lobe lung mass. Two tiny subcentimeter liver hypodensities unchanged and too small to characterize but likely cysts. Bilateral renal cysts. Minimal diverticulosis of the colon. Aortic  Atherosclerosis (ICD10-I70.0). Atherosclerotic coronary artery disease. Electronically Signed   By: Marin Olp M.D.   On: 02/11/2019 15:10    EKG:  EKG: there are no previous tracings available for comparison.  IMPRESSION AND PLAN:   68 y.o. male  past medical history of metastatic lung cancer currently on chemo, MI, CAD status post stent and bulging disc presenting as a direct admit from oncology clinic with complaints of nausea, vomiting, diarrhea and abdominal pain.   1. Severe Colitis - Likely chemo induced. Patient with metastatic adenocarcinoma of the lung was received treatment with carboplatin and Alimta. Following two cycles of chemotherapy, the patient developed diarrhea, abdominal pain, nausea and vomiting - Admit to medsurg unit - CT abdomen and pelvis demonstrated colitis - C. difficile pending - start empiric treatment with Vancomycin and Metronidazole due to concerns of possible pseudomembranous colitis - PRN IV/p.o. pain meds - PRN antiemetics -  IVFs hydration - Protonix 40 mg IV BID - Consider GI consult if no improved   2. 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 Mike Gip  3. Hypokalemia - received K+ supplements - Recheck in am + mag  4. Cancer related pain -We will continue fentanyl patch, oxycodone and morphine as needed  5. Elevated Bilirubin - Thought to be secondary to carboplatin.   All the records are reviewed and case discussed with ED provider. Management plans discussed with the patient, family and they are in agreement.  CODE STATUS: FULL  TOTAL TIME TAKING CARE OF THIS PATIENT: 29minutes.    on 02/11/2019 at 9:33 PM  Rufina Falco, DNP, FNP-BC [facility] Nurse Practitioner Between 7am to 6pm - Pager [phone]  After 6pm go to [email or web address] - password [email or web address] [facility]  Office  [phone]  CC: Primary care physician; [name], NP

## 2019-02-11 NOTE — Progress Notes (Signed)
Pharmacy Antibiotic Note  Jimmy West is a 68 y.o. male admitted on 02/11/2019 with intra-abdominal infection.  Pharmacy has been consulted for Zosyn dosing.  Plan: Zosyn 3.375g IV q8h (4 hour infusion).     Temp (24hrs), Avg:98.5 F (36.9 C), Min:98 F (36.7 C), Max:99.3 F (37.4 C)  Recent Labs  Lab 02/07/19 0927 02/10/19 1814 02/10/19 1835 02/11/19 1104  WBC 7.8  --  13.5* 11.9*  CREATININE 0.89 0.88  --  0.88    Estimated Creatinine Clearance: 72.5 mL/min (by C-G formula based on SCr of 0.88 mg/dL).    No Known Allergies  Antimicrobials this admission: Zosyn 7/24 >>   Thank you for allowing pharmacy to be a part of this patient's care.  Paulina Fusi, PharmD, BCPS 02/11/2019 6:21 PM

## 2019-02-11 NOTE — Consult Note (Addendum)
Pharmacy Antibiotic Note  Oak Dorey is a 68 y.o. male admitted on 02/11/2019 with possible pseudomembranous colitis.  Pharmacy has been consulted for IV Vancomycin dosing. Patient unable to take POs.  Patient is also receiving flagyl 500mg  IV every 8 hours   Plan: Vancomycin 1500mg  IV x 1 loading dose ordered.  Start Vancomycin 750 mg IV Q 12 hrs. Goal AUC 400-550. Expected AUC: 474 SCr used: 0.88      Temp (24hrs), Avg:98.5 F (36.9 C), Min:98 F (36.7 C), Max:99.3 F (37.4 C)  Recent Labs  Lab 02/07/19 0927 02/10/19 1814 02/10/19 1835 02/11/19 1104  WBC 7.8  --  13.5* 11.9*  CREATININE 0.89 0.88  --  0.88    Estimated Creatinine Clearance: 72.5 mL/min (by C-G formula based on SCr of 0.88 mg/dL).    No Known Allergies  Antimicrobials this admission: 7/24 Zosyn x 1 7/24 Flagyl >>  7/24 Vancomycin >>   Microbiology results: 7/24 BCx: pending  Thank you for allowing pharmacy to be a part of this patient's care.  Pernell Dupre, PharmD, BCPS Clinical Pharmacist 02/11/2019 11:29 PM

## 2019-02-11 NOTE — Progress Notes (Signed)
Symptom Management [facility]  [phone] [phone]  Patient Care Team: [name], NP as PCP - General (Nurse Practitioner) Telford Nab, RN as Registered Nurse Burlene Arnt, Gerda Diss, MD as Attending Physician (Emergency Medicine) Lebron Conners, [place] (Physician Assistant) Wonda Horner, MD as Referring Physician (Neurosurgery)   Name of the patient: Jimmy West  [id number]  1951/02/16   Date of visit: 02/11/19  Diagnosis-metastatic lung cancer  Chief complaint/ Reason for visit-nausea, vomiting, diarrhea, abdominal pain  Heme/Onc history:  Oncology History  Primary cancer of right lower lobe of lung ([place])  04/26/2018 Initial Diagnosis   Primary cancer of right lower lobe of lung ([place])   05/24/2018 - 01/30/2019 Chemotherapy   The patient had pembrolizumab (KEYTRUDA) 200 mg in sodium chloride 0.9 % 50 mL chemo infusion, 200 mg, Intravenous, Once, 11 of 14 cycles Administration: 200 mg (05/24/2018), 200 mg (07/16/2018), 200 mg (08/09/2018), 200 mg (08/30/2018), 200 mg (09/20/2018), 200 mg (06/14/2018), 200 mg (10/11/2018), 200 mg (11/08/2018), 200 mg (11/29/2018), 200 mg (12/20/2018), 200 mg (01/10/2019)  for chemotherapy treatment.    02/08/2019 -  Chemotherapy   The patient had palonosetron (ALOXI) injection 0.25 mg, 0.25 mg, Intravenous,  Once, 1 of 4 cycles Administration: 0.25 mg (02/08/2019) PEMEtrexed (ALIMTA) 900 mg in sodium chloride 0.9 % 100 mL chemo infusion, 500 mg/m2 = 900 mg, Intravenous,  Once, 1 of 6 cycles Administration: 900 mg (02/08/2019) CARBOplatin (PARAPLATIN) 500 mg in sodium chloride 0.9 % 250 mL chemo infusion, 470 mg (100 % of original dose 472 mg), Intravenous,  Once, 1 of 4 cycles Dose modification:   (original dose 472 mg, Cycle 1) Administration: 500 mg (02/08/2019) pembrolizumab (KEYTRUDA) 200 mg in sodium chloride 0.9 % 50 mL chemo infusion, 200 mg, Intravenous, Once, 1 of 6  cycles Administration: 200 mg (02/08/2019) fosaprepitant (EMEND) 150 mg, dexamethasone (DECADRON) 12 mg in sodium chloride 0.9 % 145 mL IVPB, , Intravenous,  Once, 1 of 4 cycles Administration:  (02/08/2019)  for chemotherapy treatment.      Interval history-Jimmy West, 68 year old male with history of metastatic lung cancer presents to symptom management clinic for vomiting, diarrhea, and abdominal pain.   He received his first cycle of carboplatin and Alimta with continuation of Keytruda on 02/08/2019.  On 02/10/2019 he called cancer center complaining of nausea, vomiting, and weakness and he was seen in ER. Symptoms thought to be secondary to chemotherapy.  He was quite uncomfortable and received IV fluids, two doses of Ativan, and trazodone for nausea and restlessness.  He was discharged home with prescription for trazodone.    He called cancer center this morning stating that overnight nausea, vomiting, and diarrhea persisted and he developed left-sided abdominal pain which she rates 5 of 10 describes as aching.  Pain does not improve with bowel movement. He describes emesis as yellow/watery. Occurred 3-4 times. Continues to have hiccups and dry heaves. He has not been able to eat or drink and says he has grown increasingly weak to ambulate by himself.  He lives alone.  At baseline he is independent of his ADLs and IADLs, active, plays golf, and today is a significant change. He says he feels 'horrible' and 'too weak to stand'. He tried taking anti-emetics and imodium at home but was unable to keep them down. No fever. Feels 'cold'. No blood in stool or emesis. No chest pain. No cough.   ECOG at baseline is 0. Today he is a 3- symptomatic, > 50%  confined to bed.   Review of systems- Review of Systems  Constitutional: Positive for malaise/fatigue. Negative for chills, diaphoresis, fever and weight loss.  HENT: Negative for congestion, sinus pain and sore throat.   Eyes: Negative for double  vision and redness.  Respiratory: Negative for cough, hemoptysis, sputum production, shortness of breath and wheezing.   Cardiovascular: Negative for chest pain, palpitations and leg swelling.  Gastrointestinal: Positive for abdominal pain, diarrhea, nausea and vomiting. Negative for blood in stool, constipation, heartburn and melena.  Genitourinary: Negative for dysuria, frequency and urgency.  Musculoskeletal: Positive for myalgias. Negative for back pain, falls and joint pain.  Skin: Negative for itching and rash.  Neurological: Positive for weakness. Negative for dizziness, loss of consciousness and headaches.  Psychiatric/Behavioral: Negative for depression. The patient is not nervous/anxious.     Current treatment- cycle 1 of carboplatin and Alimta with continuation of Keytruda on 02/08/2019  No Known Allergies  Past Medical History:  Diagnosis Date   Bulging lumbar disc    Cancer ([place])    stage 4 lung cancer   Heart attack [facility])     Past Surgical History:  Procedure Laterality Date   CARDIAC CATHETERIZATION     two stents   KNEE SURGERY Left    PORTA CATH INSERTION N/A 05/21/2018   Procedure: PORTA CATH INSERTION;  Surgeon: Algernon Huxley, MD;  Location: [facility];  Service: Cardiovascular;  Laterality: N/A;    Social History   Socioeconomic History   Marital status: Single    Spouse name: Not on file   Number of children: Not on file   Years of education: Not on file   Highest education level: Not on file  Occupational History   Not on file  Social Needs   Financial resource strain: Not on file   Food insecurity    Worry: Not on file    Inability: Not on file   Transportation needs    Medical: Not on file    Non-medical: Not on file  Tobacco Use   Smoking status: Former Smoker    Packs/day: 1.50    Years: 15.00    Pack years: 22.50    Types: Cigarettes    Quit date: 11/03/2003    Years since quitting: 15.2   Smokeless tobacco:  Never Used  Substance and Sexual Activity   Alcohol use: Yes    Alcohol/week: 15.0 standard drinks    Types: 15 Shots of liquor per week   Drug use: No   Sexual activity: Not Currently  Lifestyle   Physical activity    Days per week: Not on file    Minutes per session: Not on file   Stress: Not on file  Relationships   Social connections    Talks on phone: Not on file    Gets together: Not on file    Attends religious service: Not on file    Active member of club or organization: Not on file    Attends meetings of clubs or organizations: Not on file    Relationship status: Not on file   Intimate partner violence    Fear of current or ex partner: Not on file    Emotionally abused: Not on file    Physically abused: Not on file    Forced sexual activity: Not on file  Other Topics Concern   Not on file  Social History Narrative   Not on file    Family History  Problem Relation Age of  Onset   Heart failure Father    Cancer Maternal Aunt    Heart failure Maternal Uncle    Dementia Paternal Grandmother    Cancer Maternal Aunt      Current Outpatient Medications:    aspirin EC 81 MG tablet, Take 81 mg by mouth daily., Disp: , Rfl:    Calcium 600-400 MG-UNIT CHEW, Chew 2 tablets by mouth daily., Disp: , Rfl:    dexamethasone (DECADRON) 4 MG tablet, Take 1 tab two times a day the day before Alimta chemo, then take 2 tabs once a day for 3 days starting the day after chemo., Disp: 30 tablet, Rfl: 1   fentaNYL (DURAGESIC) 75 MCG/HR, Place 1 patch onto the skin every 3 (three) days., Disp: 10 patch, Rfl: 0   FLUoxetine (PROZAC) 20 MG capsule, Take 1 capsule (20 mg total) by mouth daily., Disp: 30 capsule, Rfl: 3   folic acid (FOLVITE) 1 MG tablet, Take 1 tablet (1 mg total) by mouth daily. Start 5-7 days before Alimta chemotherapy. Continue until 21 days after Alimta completed., Disp: 100 tablet, Rfl: 3   ondansetron (ZOFRAN ODT) 4 MG disintegrating tablet, Take  1 tablet (4 mg total) by mouth every 8 (eight) hours as needed for nausea or vomiting., Disp: 20 tablet, Rfl: 0   Oxycodone HCl 20 MG TABS, Take 0.5-1 tablets (10-20 mg total) by mouth every 4 (four) hours as needed (for severe pain)., Disp: 90 tablet, Rfl: 0   traZODone (DESYREL) 50 MG tablet, Take 2 tablets (100 mg total) by mouth at bedtime., Disp: 30 tablet, Rfl: 0   acetaminophen (TYLENOL) 500 MG tablet, Take 500 mg by mouth 3 (three) times daily as needed., Disp: , Rfl:    ibuprofen (ADVIL,MOTRIN) 200 MG tablet, Take 200 mg by mouth every 6 (six) hours as needed., Disp: , Rfl:    naloxone (NARCAN) nasal spray 4 mg/0.1 mL, 1 spray by nose in the event of opioid overdose. Call EMS (911) immediately if medication is used. (Patient not taking: Reported on 10/13/2018), Disp: 1 kit, Rfl: 0   pembrolizumab (KEYTRUDA) 100 MG/4ML SOLN, Inject 2 mg/kg into the vein every 30 (thirty) days. , Disp: , Rfl:    polyethylene glycol (MIRALAX / GLYCOLAX) packet, Take 17 g by mouth daily., Disp: , Rfl:    senna (SENOKOT) 8.6 MG tablet, Take 1 tablet by mouth as needed. , Disp: , Rfl:  No current facility-administered medications for this visit.   Facility-Administered Medications Ordered in Other Visits:    0.9 %  sodium chloride infusion, , Intravenous, Continuous, Verlon Au, NP  Physical exam:  Vitals:   02/11/19 1030  BP: 93/64  Pulse: 72  Temp: 98.2 F (36.8 C)  TempSrc: Tympanic   Physical Exam Constitutional:      Appearance: He is ill-appearing.     Comments: Ill & uncomfortable appearing; in wheelchair.   HENT:     Head: Normocephalic.     Mouth/Throat:     Mouth: Mucous membranes are dry.     Pharynx: Oropharynx is clear.  Eyes:     General: No scleral icterus.    Conjunctiva/sclera: Conjunctivae normal.  Cardiovascular:     Rate and Rhythm: Normal rate and regular rhythm.  Pulmonary:     Effort: Pulmonary effort is normal.     Breath sounds: Normal breath sounds.   Abdominal:     General: There is no distension.     Palpations: There is no mass.  Tenderness: There is abdominal tenderness (LUQ and LLQ TTP. ). There is no guarding or rebound.     Comments: No cullen or G-T sign.   Musculoskeletal:        General: No deformity.     Right lower leg: No edema.     Left lower leg: No edema.  Skin:    General: Skin is warm and dry.  Neurological:     Mental Status: He is oriented to person, place, and time.  Psychiatric:        Attention and Perception: Attention normal.        Cognition and Memory: Memory normal.      CMP Latest Ref Rng & Units 02/11/2019  Glucose 70 - 99 mg/dL 100(H)  BUN 8 - 23 mg/dL 20  Creatinine 0.61 - 1.24 mg/dL 0.88  Sodium 135 - 145 mmol/L 137  Potassium 3.5 - 5.1 mmol/L 3.2(L)  Chloride 98 - 111 mmol/L 105  CO2 22 - 32 mmol/L 22  Calcium 8.9 - 10.3 mg/dL 8.7(L)  Total Protein 6.5 - 8.1 g/dL 7.1  Total Bilirubin 0.3 - 1.2 mg/dL 1.6(H)  Alkaline Phos 38 - 126 U/L 53  AST 15 - 41 U/L 25  ALT 0 - 44 U/L 20   CBC Latest Ref Rng & Units 02/11/2019  WBC 4.0 - 10.5 K/uL 11.9(H)  Hemoglobin 13.0 - 17.0 g/dL 13.7  Hematocrit 39.0 - 52.0 % 40.5  Platelets 150 - 400 K/uL 286    No images are attached to the encounter.  Ct Chest W Contrast  Result Date: 01/28/2019 CLINICAL DATA:  Follow-up metastatic lung cancer EXAM: CT CHEST, ABDOMEN, AND PELVIS WITH CONTRAST TECHNIQUE: Multidetector CT imaging of the chest, abdomen and pelvis was performed following the standard protocol during bolus administration of intravenous contrast. CONTRAST:  132m OMNIPAQUE IOHEXOL 300 MG/ML SOLN, additional oral enteric contrast COMPARISON:  11/02/2018, 08/26/2018 FINDINGS: CT CHEST FINDINGS Cardiovascular: Right chest port catheter. Mild aortic atherosclerosis. Coronary artery calcifications and/or stents. Normal heart size. No pericardial effusion. Mediastinum/Nodes: No change in right hilar soft tissue and lymph nodes. Thyroid gland,  trachea, and esophagus demonstrate no significant findings. Lungs/Pleura: Moderate centrilobular emphysema. Slight interval enlargement of a right lower lobe mass with central necrosis measuring 4.6 x 3.4 cm, previously 4.0 x 3.0 cm when measured similarly (series 4, image 78). No change in right upper lobe nodules, measuring 1.4 and 0.8 cm (series 4, image 35). There is almost no residua of left lung nodules, with irregular opacities in the apical left upper lobe (series 4, image 17) and superior segment left lower lobe (series 4, image 48). No pleural effusion or pneumothorax. Musculoskeletal: Redemonstrated vertebra plana deformity of T4. CT ABDOMEN PELVIS FINDINGS Hepatobiliary: No solid liver abnormality is seen. No gallstones, gallbladder wall thickening, or biliary dilatation. Pancreas: Unremarkable. No pancreatic ductal dilatation or surrounding inflammatory changes. Spleen: Normal in size without significant abnormality. Adrenals/Urinary Tract: Unchanged left adrenal nodule measuring 1.6 x 1.1 cm, previously characterized as non metabolic by PET-CT (series 2, image 56). Kidneys are normal, without renal calculi, solid lesion, or hydronephrosis. Bladder is unremarkable. Stomach/Bowel: Stomach is within normal limits. No evidence of bowel wall thickening, distention, or inflammatory changes. Sigmoid diverticulosis. Vascular/Lymphatic: No significant vascular findings are present. No enlarged abdominal or pelvic lymph nodes. Reproductive: No mass or other abnormality. Other: No abdominal wall hernia or abnormality. No abdominopelvic ascites. Musculoskeletal: No acute or significant osseous findings. IMPRESSION: 1. Slight interval enlargement of a right lower lobe mass with  central necrosis measuring 4.6 x 3.4 cm, previously 4.0 x 3.0 cm when measured similarly (series 4, image 78). No change in right upper lobe nodules, measuring 1.4 and 0.8 cm (series 4, image 35). There is almost no residua of left lung  nodules, with irregular opacities in the apical left upper lobe (series 4, image 17) and superior segment left lower lobe (series 4, image 48). 2. No change in right hilar soft tissue and lymph nodes. 3. Redemonstrated vertebra plana deformity of T4. no evidence of new osseous metastatic disease. 4. No evidence of distant metastatic disease in the abdomen or pelvis. 5. Unchanged left adrenal nodule measuring 1.6 x 1.1 cm, previously characterized as non metabolic by PET-CT (series 2, image 56). 6.  Other chronic and incidental findings as detailed above. Electronically Signed   By: Eddie Candle M.D.   On: 01/28/2019 15:21   Ct Abdomen Pelvis W Contrast  Result Date: 01/28/2019 CLINICAL DATA:  Follow-up metastatic lung cancer EXAM: CT CHEST, ABDOMEN, AND PELVIS WITH CONTRAST TECHNIQUE: Multidetector CT imaging of the chest, abdomen and pelvis was performed following the standard protocol during bolus administration of intravenous contrast. CONTRAST:  137m OMNIPAQUE IOHEXOL 300 MG/ML SOLN, additional oral enteric contrast COMPARISON:  11/02/2018, 08/26/2018 FINDINGS: CT CHEST FINDINGS Cardiovascular: Right chest port catheter. Mild aortic atherosclerosis. Coronary artery calcifications and/or stents. Normal heart size. No pericardial effusion. Mediastinum/Nodes: No change in right hilar soft tissue and lymph nodes. Thyroid gland, trachea, and esophagus demonstrate no significant findings. Lungs/Pleura: Moderate centrilobular emphysema. Slight interval enlargement of a right lower lobe mass with central necrosis measuring 4.6 x 3.4 cm, previously 4.0 x 3.0 cm when measured similarly (series 4, image 78). No change in right upper lobe nodules, measuring 1.4 and 0.8 cm (series 4, image 35). There is almost no residua of left lung nodules, with irregular opacities in the apical left upper lobe (series 4, image 17) and superior segment left lower lobe (series 4, image 48). No pleural effusion or pneumothorax.  Musculoskeletal: Redemonstrated vertebra plana deformity of T4. CT ABDOMEN PELVIS FINDINGS Hepatobiliary: No solid liver abnormality is seen. No gallstones, gallbladder wall thickening, or biliary dilatation. Pancreas: Unremarkable. No pancreatic ductal dilatation or surrounding inflammatory changes. Spleen: Normal in size without significant abnormality. Adrenals/Urinary Tract: Unchanged left adrenal nodule measuring 1.6 x 1.1 cm, previously characterized as non metabolic by PET-CT (series 2, image 56). Kidneys are normal, without renal calculi, solid lesion, or hydronephrosis. Bladder is unremarkable. Stomach/Bowel: Stomach is within normal limits. No evidence of bowel wall thickening, distention, or inflammatory changes. Sigmoid diverticulosis. Vascular/Lymphatic: No significant vascular findings are present. No enlarged abdominal or pelvic lymph nodes. Reproductive: No mass or other abnormality. Other: No abdominal wall hernia or abnormality. No abdominopelvic ascites. Musculoskeletal: No acute or significant osseous findings. IMPRESSION: 1. Slight interval enlargement of a right lower lobe mass with central necrosis measuring 4.6 x 3.4 cm, previously 4.0 x 3.0 cm when measured similarly (series 4, image 78). No change in right upper lobe nodules, measuring 1.4 and 0.8 cm (series 4, image 35). There is almost no residua of left lung nodules, with irregular opacities in the apical left upper lobe (series 4, image 17) and superior segment left lower lobe (series 4, image 48). 2. No change in right hilar soft tissue and lymph nodes. 3. Redemonstrated vertebra plana deformity of T4. no evidence of new osseous metastatic disease. 4. No evidence of distant metastatic disease in the abdomen or pelvis. 5. Unchanged left adrenal nodule measuring 1.6  x 1.1 cm, previously characterized as non metabolic by PET-CT (series 2, image 56). 6.  Other chronic and incidental findings as detailed above. Electronically Signed   By:  Eddie Candle M.D.   On: 01/28/2019 15:21    Assessment and plan- Patient is a 68 y.o. male diagnosed with metastatic high-grade adenocarcinoma of the lung currently on chemotherapy who presents to symptom management clinic for nausea, vomiting, diarrhea, and abdominal pain.   1.  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  He has metastasis to T4.  Clinical stage T4 N1 M1.  Brain MRI was negative.  PDL-1 90%.  He received pembrolizumab (05/24/2018-01/10/2019).  CT on 01/28/2019 showed progression.  Started carbo-Alimta with continuation of pembrolizumab on 02/08/2019.   2.  Abdominal pain- amylase and lipase slightly elevated though not significant. CT shows possible duodenitis or enteritis. He was not able to tolerate the oral contrast d/t nausea & vomiting. Discussed findings with Dr. Mike Gip who feels likely irritated/inflammed due to chemo if infection ruled out. C diff pending - no diarrhea in clinic today. Received Morphine 28m IV in clinic.   3. Chemotherapy-induced nausea and vomiting- BPs soft upon arrival. Gave IV fluids and Zofran in clinic. Attempted PO challenge but patient continued to have dry heaves.  Unable to tolerate POs including oral contrast for CT scan. Concern that patient will decline further if unable to control symptoms and recommended hospitalization.   4.  Diarrhea- thought to be secondary to chemotherapy. Unable to tolerate oral imodium. No diarrhea in clinic. Continue IV fluids. Given findings on imaging, check c diff- pending.   5.  Increased serum bilirubin-bilirubin 1.6 today.  Was 1.0 a week ago.  Likely secondary to carboplatin  6. Leukocytosis- likely secondary to decadron per Dr. CMike Gip Afebrile.   7. Hypokalemia- Potassium level decreased from yesterday and suspect secondary to poor oral intake and GI loss. K 3.2 today. Continue to monitor. If tolerating orals can  consider oral supplementation vs IV replacement.   Given significant change in performance status for patient (normally active, independent, drives, asymptomatic and now weak, in wheelchair and he lives alone) Dr. CMike Giprecommends admission to hospital for continued IV fluids, anti-emetics. Discussed findings with ERufina Falco NP who accepts for observation.     Visit Diagnosis 1. [place]   2. Left lower quadrant abdominal pain   3. Nausea and vomiting, intractability of vomiting not specified, unspecified vomiting type   4. Diarrhea, unspecified type   5. Hypokalemia due to excessive gastrointestinal loss of potassium     Patient expressed understanding and was in agreement with this plan. He also understands that He can call clinic at any time with any questions, concerns, or complaints.   Thank you for allowing me to participate in the care of this very pleasant patient.   LBeckey Rutter DNP, AGNP-C [place](work cell) [phone](office)  CC: Dr. CMike Gip

## 2019-02-12 DIAGNOSIS — Z955 Presence of coronary angioplasty implant and graft: Secondary | ICD-10-CM | POA: Diagnosis not present

## 2019-02-12 DIAGNOSIS — Z7982 Long term (current) use of aspirin: Secondary | ICD-10-CM | POA: Diagnosis not present

## 2019-02-12 DIAGNOSIS — T451X5A Adverse effect of antineoplastic and immunosuppressive drugs, initial encounter: Secondary | ICD-10-CM | POA: Diagnosis present

## 2019-02-12 DIAGNOSIS — R112 Nausea with vomiting, unspecified: Secondary | ICD-10-CM

## 2019-02-12 DIAGNOSIS — Z79899 Other long term (current) drug therapy: Secondary | ICD-10-CM | POA: Diagnosis not present

## 2019-02-12 DIAGNOSIS — R17 Unspecified jaundice: Secondary | ICD-10-CM | POA: Diagnosis present

## 2019-02-12 DIAGNOSIS — C3491 Malignant neoplasm of unspecified part of right bronchus or lung: Secondary | ICD-10-CM | POA: Diagnosis not present

## 2019-02-12 DIAGNOSIS — E876 Hypokalemia: Secondary | ICD-10-CM | POA: Diagnosis not present

## 2019-02-12 DIAGNOSIS — K219 Gastro-esophageal reflux disease without esophagitis: Secondary | ICD-10-CM | POA: Diagnosis present

## 2019-02-12 DIAGNOSIS — K529 Noninfective gastroenteritis and colitis, unspecified: Secondary | ICD-10-CM | POA: Diagnosis present

## 2019-02-12 DIAGNOSIS — K5900 Constipation, unspecified: Secondary | ICD-10-CM

## 2019-02-12 DIAGNOSIS — I2510 Atherosclerotic heart disease of native coronary artery without angina pectoris: Secondary | ICD-10-CM | POA: Diagnosis present

## 2019-02-12 DIAGNOSIS — C7951 Secondary malignant neoplasm of bone: Secondary | ICD-10-CM

## 2019-02-12 DIAGNOSIS — G893 Neoplasm related pain (acute) (chronic): Secondary | ICD-10-CM | POA: Diagnosis not present

## 2019-02-12 DIAGNOSIS — C3431 Malignant neoplasm of lower lobe, right bronchus or lung: Secondary | ICD-10-CM | POA: Diagnosis not present

## 2019-02-12 DIAGNOSIS — Z20828 Contact with and (suspected) exposure to other viral communicable diseases: Secondary | ICD-10-CM | POA: Diagnosis present

## 2019-02-12 DIAGNOSIS — I252 Old myocardial infarction: Secondary | ICD-10-CM | POA: Diagnosis not present

## 2019-02-12 DIAGNOSIS — Z87891 Personal history of nicotine dependence: Secondary | ICD-10-CM | POA: Diagnosis not present

## 2019-02-12 DIAGNOSIS — F329 Major depressive disorder, single episode, unspecified: Secondary | ICD-10-CM | POA: Diagnosis present

## 2019-02-12 LAB — CBC WITH DIFFERENTIAL/PLATELET
Abs Immature Granulocytes: 0.03 10*3/uL (ref 0.00–0.07)
Basophils Absolute: 0 10*3/uL (ref 0.0–0.1)
Basophils Relative: 0 %
Eosinophils Absolute: 0 10*3/uL (ref 0.0–0.5)
Eosinophils Relative: 0 %
HCT: 38.1 % — ABNORMAL LOW (ref 39.0–52.0)
Hemoglobin: 12.9 g/dL — ABNORMAL LOW (ref 13.0–17.0)
Immature Granulocytes: 0 %
Lymphocytes Relative: 8 %
Lymphs Abs: 0.8 10*3/uL (ref 0.7–4.0)
MCH: 29.5 pg (ref 26.0–34.0)
MCHC: 33.9 g/dL (ref 30.0–36.0)
MCV: 87.2 fL (ref 80.0–100.0)
Monocytes Absolute: 0.1 10*3/uL (ref 0.1–1.0)
Monocytes Relative: 2 %
Neutro Abs: 8.1 10*3/uL — ABNORMAL HIGH (ref 1.7–7.7)
Neutrophils Relative %: 90 %
Platelets: 226 10*3/uL (ref 150–400)
RBC: 4.37 MIL/uL (ref 4.22–5.81)
RDW: 13.2 % (ref 11.5–15.5)
WBC: 9 10*3/uL (ref 4.0–10.5)
nRBC: 0 % (ref 0.0–0.2)

## 2019-02-12 LAB — COMPREHENSIVE METABOLIC PANEL
ALT: 17 U/L (ref 0–44)
AST: 13 U/L — ABNORMAL LOW (ref 15–41)
Albumin: 3.4 g/dL — ABNORMAL LOW (ref 3.5–5.0)
Alkaline Phosphatase: 46 U/L (ref 38–126)
Anion gap: 6 (ref 5–15)
BUN: 16 mg/dL (ref 8–23)
CO2: 21 mmol/L — ABNORMAL LOW (ref 22–32)
Calcium: 7.5 mg/dL — ABNORMAL LOW (ref 8.9–10.3)
Chloride: 109 mmol/L (ref 98–111)
Creatinine, Ser: 0.7 mg/dL (ref 0.61–1.24)
GFR calc Af Amer: >60 mL/min (ref >60)
GFR calc non Af Amer: >60 mL/min (ref >60)
Glucose, Bld: 96 mg/dL (ref 70–99)
Potassium: 3.5 mmol/L (ref 3.5–5.1)
Sodium: 136 mmol/L (ref 135–145)
Total Bilirubin: 1.6 mg/dL — ABNORMAL HIGH (ref 0.3–1.2)
Total Protein: 5.8 g/dL — ABNORMAL LOW (ref 6.5–8.1)

## 2019-02-12 LAB — PROCALCITONIN: Procalcitonin: <0.1 ng/mL

## 2019-02-12 LAB — MAGNESIUM: Magnesium: 2.1 mg/dL (ref 1.7–2.4)

## 2019-02-12 MED ORDER — ENOXAPARIN SODIUM 40 MG/0.4ML ~~LOC~~ SOLN
40.0000 mg | SUBCUTANEOUS | Status: DC
  Administered 2019-02-12: 20:00:00 40 mg via SUBCUTANEOUS
  Filled 2019-02-12: qty 0.4

## 2019-02-12 MED ORDER — VANCOMYCIN HCL IN DEXTROSE 750-5 MG/150ML-% IV SOLN
750.0000 mg | Freq: Two times a day (BID) | INTRAVENOUS | Status: DC
  Filled 2019-02-12 (×2): qty 150

## 2019-02-12 MED ORDER — OXYCODONE HCL 5 MG PO TABS
5.0000 mg | ORAL_TABLET | ORAL | Status: DC | PRN
  Administered 2019-02-12 – 2019-02-13 (×2): 10 mg via ORAL
  Filled 2019-02-12 (×2): qty 2

## 2019-02-12 MED ORDER — ORAL CARE MOUTH RINSE: 15.0000 mL | Freq: Two times a day (BID) | OROMUCOSAL | Status: DC

## 2019-02-12 MED ORDER — CHLORHEXIDINE GLUCONATE 0.12 % MT SOLN
15.0000 mL | Freq: Two times a day (BID) | OROMUCOSAL | Status: DC
  Administered 2019-02-12 – 2019-02-13 (×2): 15 mL via OROMUCOSAL
  Filled 2019-02-12 (×2): qty 15

## 2019-02-12 MED ORDER — SODIUM CHLORIDE 0.9 % IV SOLN
INTRAVENOUS | Status: DC | PRN
  Administered 2019-02-12: 50 mL via INTRAVENOUS

## 2019-02-12 MED ORDER — PIPERACILLIN-TAZOBACTAM 3.375 G IVPB
3.3750 g | Freq: Three times a day (TID) | INTRAVENOUS | Status: DC
  Administered 2019-02-12 – 2019-02-13 (×4): 3.375 g via INTRAVENOUS
  Filled 2019-02-12 (×4): qty 50

## 2019-02-12 NOTE — Assessment & Plan Note (Addendum)
#  68 year old male patient with metastatic lung cancer most recently on chemoimmunotherapy is admitted the hospital for nausea vomiting abdominal pain severe diarrhea.  #Adenocarcinoma the lung stage IV-status post carbo Alimta Keytruda-cycle #1; day #7 today.  No obvious clinical progression noted.    #Abdominal discomfort/nausea vomiting diarrhea-likely secondary to enteritis as noted on the CT scan.  The etiology is unclear; clinically improved.  Recommend discharging on Levaquin for 5 more days/total of 7 days.  #Back pain/bone mets-pain-stable.  #Discussed with the patient.  Discussed with Dr. Verdell Carmine.  # Addendum: Try to call patient's friend [name] - unable to Reach/ leave a message.

## 2019-02-12 NOTE — Progress Notes (Signed)
Pharmacy Antibiotic Note  Jimmy West is a 68 y.o. male admitted on 02/11/2019 with intra-abdominal infection.  Pharmacy has been consulted for Zosyn dosing.  Plan: Zosyn 3.375g IV q8h (4 hour infusion).  Height: 5\' 6"  (167.6 cm) Weight: 149 lb 14.6 oz (68 kg) IBW/kg (Calculated) : 63.8  Temp (24hrs), Avg:98.8 F (37.1 C), Min:98.4 F (36.9 C), Max:99.3 F (37.4 C)  Recent Labs  Lab 02/07/19 0927 02/10/19 1814 02/10/19 1835 02/11/19 1104 02/12/19 0558  WBC 7.8  --  13.5* 11.9* 9.0  CREATININE 0.89 0.88  --  0.88 0.70    Estimated Creatinine Clearance: 79.8 mL/min (by C-G formula based on SCr of 0.7 mg/dL).    No Known Allergies  Antimicrobials this admission: 7/24 Zosyn x 1 7/24 Flagyl >> 7/25 7/24 Vancomycin >> 7/25 7/25 Zosyn  Microbiology results: 7/24 BCx: NG x 2  Thank you for allowing pharmacy to be a part of this patient's care.  Olivia Canter, [facility] 02/12/2019 11:44 AM

## 2019-02-12 NOTE — Progress Notes (Signed)
[facility] NAME: Jimmy West    MR#:  [id number]  DATE OF BIRTH:  1951/06/02  SUBJECTIVE:   Patient admitted to the hospital secondary to abdominal pain nausea and vomiting and suspected to have enteritis/colitis.  Abdominal pain improved since yesterday, patient is tolerating a clear liquid diet well this morning.  No diarrhea no other associated complaints or symptoms.  REVIEW OF SYSTEMS:    Review of Systems  Constitutional: Negative for chills and fever.  HENT: Negative for congestion and tinnitus.   Eyes: Negative for blurred vision and double vision.  Respiratory: Negative for cough, shortness of breath and wheezing.   Cardiovascular: Negative for chest pain, orthopnea and PND.  Gastrointestinal: Positive for abdominal pain and nausea. Negative for diarrhea and vomiting.  Genitourinary: Negative for dysuria and hematuria.  Neurological: Negative for dizziness, sensory change and focal weakness.  All other systems reviewed and are negative.   Nutrition: Full liquids Tolerating Diet: Yes Tolerating PT: Await Eval.   DRUG ALLERGIES:  No Known Allergies  VITALS:  Blood pressure (P) 108/72, pulse (P) 61, temperature (P) 98.4 F (36.9 C), temperature source (P) Oral, resp. rate (P) 20, height 5\' 6"  (1.676 m), weight 68 kg, SpO2 (P) 98 %.  PHYSICAL EXAMINATION:   Physical Exam  GENERAL:  68 y.o.-year-old patient sitting up in chair in no acute distress.  EYES: Pupils equal, round, reactive to light and accommodation. No scleral icterus. Extraocular muscles intact.  HEENT: Head atraumatic, normocephalic. Oropharynx and nasopharynx clear.  NECK:  Supple, no jugular venous distention. No thyroid enlargement, no tenderness.  LUNGS: Normal breath sounds bilaterally, no wheezing, rales, rhonchi. No use of accessory muscles of respiration.  CARDIOVASCULAR: S1, S2 normal. No murmurs, rubs, or gallops.  ABDOMEN: Soft,  Tender diffusely, no rebound, rigidity, nondistended. Bowel sounds present. No organomegaly or mass.  EXTREMITIES: No cyanosis, clubbing or edema b/l.    NEUROLOGIC: Cranial nerves II through XII are intact. No focal Motor or sensory deficits b/l.   PSYCHIATRIC: The patient is alert and oriented x 3.  SKIN: No obvious rash, lesion, or ulcer.    LABORATORY PANEL:   CBC Recent Labs  Lab 02/12/19 0558  WBC 9.0  HGB 12.9*  HCT 38.1*  PLT 226   ------------------------------------------------------------------------------------------------------------------  Chemistries  Recent Labs  Lab 02/12/19 0558  NA 136  K 3.5  CL 109  CO2 21*  GLUCOSE 96  BUN 16  CREATININE 0.70  CALCIUM 7.5*  MG 2.1  AST 13*  ALT 17  ALKPHOS 46  BILITOT 1.6*   ------------------------------------------------------------------------------------------------------------------  Cardiac Enzymes No results for input(s): TROPONINI in the last 168 hours. ------------------------------------------------------------------------------------------------------------------  RADIOLOGY:  Ct Abdomen Pelvis W Contrast  Result Date: 02/11/2019 CLINICAL DATA:  Abdominal pain left upper and lower quadrant with nausea, vomiting and diarrhea. History of metastatic lung cancer on chemotherapy. EXAM: CT ABDOMEN AND PELVIS WITH CONTRAST TECHNIQUE: Multidetector CT imaging of the abdomen and pelvis was performed using the standard protocol following bolus administration of intravenous contrast. CONTRAST:  17mL OMNIPAQUE IOHEXOL 300 MG/ML  SOLN COMPARISON:  01/28/2019 and 11/02/2018 FINDINGS: Lower chest: Partial visualization of patient's known necrotic right lower lobe mass with scarring over the posterior right base. Calcified plaque over the right coronary artery. Right-sided Port-A-Cath with tip over the right atrium unchanged. Hepatobiliary: 2 tiny subcentimeter liver hypodensities unchanged and too small to characterize  but likely cysts. Gallbladder and biliary tree are normal. Pancreas: Normal.  Spleen: Normal. Adrenals/Urinary Tract: Stable 1.5 cm left adrenal nodule. Right adrenal gland is normal. Kidneys are normal in size without hydronephrosis or nephrolithiasis. Several bilateral renal cysts. Ureters and bladder are normal. Stomach/Bowel: Stomach is normal. Mild wall thickening of the duodenum. Possible mild wall thickening over several proximal jejunal loops. Appendix is normal. Minimal diverticulosis of the colon. Vascular/Lymphatic: Mild calcified plaque over the abdominal aorta which is normal in caliber. No adenopathy. Reproductive: Normal. Other: No significant free fluid. Musculoskeletal: Degenerative change of the spine. Mild degenerative change of the hips. IMPRESSION: Mild wall thickening involving the duodenum and possibly several proximal jejunal loops as findings may be due to duodenitis or regional enteritis of infectious or inflammatory nature. Partially visualized known large necrotic right lower lobe lung mass. Two tiny subcentimeter liver hypodensities unchanged and too small to characterize but likely cysts. Bilateral renal cysts. Minimal diverticulosis of the colon. Aortic Atherosclerosis (ICD10-I70.0). Atherosclerotic coronary artery disease. Electronically Signed   By: Marin Olp M.D.   On: 02/11/2019 15:10     ASSESSMENT AND PLAN:   68 year old male with past medical history of stage IV lung cancer currently ongoing immunotherapy and chemotherapy, previous history of an MI who presents to the hospital due to abdominal pain nausea and vomiting.  1.  Abdominal pain nausea and vomiting- suspected to be secondary to some mild duodenitis/enteritis. - Questionable of this is related to the patient's underlying chemotherapy that he just received few days prior to admission. - No diarrhea presently, we will continue the patient on empiric Zosyn, DC vancomycin and Flagyl for now. - Tolerating clear  liquid diet and will advance to full liquid.  2. Stage IV lung cancer- patient currently getting immunotherapy and chemotherapy. -Seen by oncology and no acute changes and continue further care as per them.  3.  Depression-continue Prozac.  4.  Chronic pain-secondary to malignancy. -Continue fentanyl patch.  5.  GERD-continue Protonix.     All the records are reviewed and case discussed with Care Management/Social Worker. Management plans discussed with the patient, family and they are in agreement.  CODE STATUS: Full code  DVT Prophylaxis: Lovenox  TOTAL TIME TAKING CARE OF THIS PATIENT: 30 minutes.   POSSIBLE D/C IN 1-2 DAYS, DEPENDING ON CLINICAL CONDITION.   Henreitta Leber M.D on 02/12/2019 at 1:46 PM  Between 7am to 6pm - Pager - [phone]  After 6pm go to [email or web address] - Technical brewer [facility]  Office  [phone]  CC: Primary care physician; [name], NP

## 2019-02-12 NOTE — Consult Note (Addendum)
[facility] NOTE  Patient Care Team: [name], NP as PCP - General (Nurse Practitioner) Telford Nab, RN as Registered Nurse Burlene Arnt, Gerda Diss, MD as Attending Physician (Emergency Medicine) Lebron Conners, [place] (Physician Assistant) Wonda Horner, MD as Referring Physician (Neurosurgery)  CHIEF COMPLAINTS/PURPOSE OF CONSULTATION:  History of lung cancer/nausea vomiting diarrhea  HISTORY OF PRESENTING ILLNESS:  Jimmy West 68 y.o.  male history of metastatic lung cancer adenocarcinoma [follows up with Dr. Mike Gip is currently in the hospital for intractable nausea vomiting diarrhea abdominal discomfort.  Patient received cycle #1 of carboplatin Alimta Keytruda on 7/20.  Approximately 2 to 3 days later patient noted to have significant abdominal discomfort nausea vomiting diarrhea.  Patient was initially evaluated in the emergency room-discharged home after symptomatic treatment with IV fluids.  However patient symptoms-nausea vomiting diarrhea got worse over the next few Days-and patient had been extremely fatigued.  In the emergency room patient had a CT scan that showed small bowel inflammation/duodenitis.   Patient has been treated with broad-spectrum antibiotics IV vancomycin; Zosyn and metronidazole.  This morning patient symptoms are improved.  Has not had any diarrhea since the morning.  Abdominal discomfort is improved.  Chronic back pain  Review of Systems  Constitutional: Positive for malaise/fatigue and weight loss. Negative for chills, diaphoresis and fever.  HENT: Negative for nosebleeds and sore throat.   Eyes: Negative for double vision.  Respiratory: Negative for cough, hemoptysis, sputum production, shortness of breath and wheezing.   Cardiovascular: Negative for chest pain, palpitations, orthopnea and leg swelling.  Gastrointestinal: Positive for abdominal pain, diarrhea, nausea and vomiting. Negative for blood in  stool, constipation, heartburn and melena.  Genitourinary: Negative for dysuria, frequency and urgency.  Musculoskeletal: Positive for back pain. Negative for joint pain.  Skin: Negative.  Negative for itching and rash.  Neurological: Negative for dizziness, tingling, focal weakness, weakness and headaches.  Endo/Heme/Allergies: Does not bruise/bleed easily.  Psychiatric/Behavioral: Negative for depression. The patient is not nervous/anxious and does not have insomnia.      MEDICAL HISTORY:  Past Medical History:  Diagnosis Date  . Bulging lumbar disc   . Cancer ([place])    stage 4 lung cancer  . Heart attack [facility])     SURGICAL HISTORY: Past Surgical History:  Procedure Laterality Date  . CARDIAC CATHETERIZATION     two stents  . KNEE SURGERY Left   . PORTA CATH INSERTION N/A 05/21/2018   Procedure: PORTA CATH INSERTION;  Surgeon: Algernon Huxley, MD;  Location: [facility];  Service: Cardiovascular;  Laterality: N/A;    SOCIAL HISTORY: Social History   Socioeconomic History  . Marital status: Single    Spouse name: Not on file  . Number of children: Not on file  . Years of education: Not on file  . Highest education level: Not on file  Occupational History  . Not on file  Social Needs  . Financial resource strain: Not on file  . Food insecurity    Worry: Not on file    Inability: Not on file  . Transportation needs    Medical: Not on file    Non-medical: Not on file  Tobacco Use  . Smoking status: Former Smoker    Packs/day: 1.50    Years: 15.00    Pack years: 22.50    Types: Cigarettes    Quit date: 11/03/2003    Years since quitting: 15.2  . Smokeless tobacco: Never Used  Substance  and Sexual Activity  . Alcohol use: Yes    Alcohol/week: 15.0 standard drinks    Types: 15 Shots of liquor per week  . Drug use: No  . Sexual activity: Not Currently  Lifestyle  . Physical activity    Days per week: Not on file    Minutes per session: Not on file  .  Stress: Not on file  Relationships  . Social Herbalist on phone: Not on file    Gets together: Not on file    Attends religious service: Not on file    Active member of club or organization: Not on file    Attends meetings of clubs or organizations: Not on file    Relationship status: Not on file  . Intimate partner violence    Fear of current or ex partner: Not on file    Emotionally abused: Not on file    Physically abused: Not on file    Forced sexual activity: Not on file  Other Topics Concern  . Not on file  Social History Narrative  . Not on file    FAMILY HISTORY: Family History  Problem Relation Age of Onset  . Heart failure Father   . Cancer Maternal Aunt   . Heart failure Maternal Uncle   . Dementia Paternal Grandmother   . Cancer Maternal Aunt     ALLERGIES:  has No Known Allergies.  MEDICATIONS:  Current Facility-Administered Medications  Medication Dose Route Frequency Provider Last Rate Last Dose  . 0.9 %  sodium chloride infusion   Intravenous Continuous Lang Snow, NP   Stopped at 02/12/19 0002  . acetaminophen (TYLENOL) tablet 500 mg  500 mg Oral Q6H PRN Lang Snow, NP      . calcium citrate-vitamin D 500-500 MG-UNIT per chewable tablet 2 tablet  2 tablet Oral Daily Ouma, Bing Neighbors, NP      . chlorhexidine (PERIDEX) 0.12 % solution 15 mL  15 mL Mouth Rinse BID Sainani, Vivek J, MD      . enoxaparin (LOVENOX) injection 40 mg  40 mg Subcutaneous Q24H Sainani, Belia Heman, MD      . fentaNYL (DURAGESIC) 75 MCG/HR 1 patch  1 patch Transdermal Q72H Lang Snow, NP   1 patch at 02/11/19 1810  . FLUoxetine (PROZAC) capsule 20 mg  20 mg Oral Daily Ouma, Bing Neighbors, NP      . folic acid (FOLVITE) tablet 1 mg  1 mg Oral Daily Ouma, Bing Neighbors, NP      . MEDLINE mouth rinse  15 mL Mouth Rinse q12n4p Sainani, Belia Heman, MD      . morphine 2 MG/ML injection 2 mg  2 mg Intravenous Q2H PRN Lang Snow, NP   2 mg at 02/11/19 1835  . ondansetron (ZOFRAN) injection 4 mg  4 mg Intravenous Q6H PRN Lang Snow, NP   4 mg at 02/12/19 1400  . oxyCODONE (Oxy IR/ROXICODONE) immediate release tablet 2.5-5 mg  2.5-5 mg Oral Q4H PRN Lang Snow, NP      . pantoprazole (PROTONIX) injection 40 mg  40 mg Intravenous Q12H Lang Snow, NP   40 mg at 02/12/19 1100  . piperacillin-tazobactam (ZOSYN) IVPB 3.375 g  3.375 g Intravenous Q8H Henreitta Leber, MD 12.5 mL/hr at 02/12/19 1403 3.375 g at 02/12/19 1403  . polyethylene glycol (MIRALAX / GLYCOLAX) packet 17 g  17 g Oral Daily Lang Snow, NP      .  senna (SENOKOT) tablet 8.6 mg  1 tablet Oral QHS PRN Lang Snow, NP      . traZODone (DESYREL) tablet 100 mg  100 mg Oral QHS Lang Snow, NP   100 mg at 02/11/19 2244      .  PHYSICAL EXAMINATION:  Vitals:   02/12/19 0800 02/12/19 1519  BP: (P) 108/72 110/63  Pulse: (P) 61 60  Resp: (P) 20 18  Temp: (P) 98.4 F (36.9 C) 98.9 F (37.2 C)  SpO2: (P) 98% 98%   Filed Weights   02/12/19 0100  Weight: 149 lb 14.6 oz (68 kg)    Physical Exam  Constitutional: He is oriented to person, place, and time and well-developed, well-nourished, and in no distress.  Patient appears drained./Tired  HENT:  Head: Normocephalic and atraumatic.  Mouth/Throat: Oropharynx is clear and moist. No oropharyngeal exudate.  Eyes: Pupils are equal, round, and reactive to light.  Neck: Normal range of motion. Neck supple.  Cardiovascular: Normal rate and regular rhythm.  Pulmonary/Chest: No respiratory distress. He has no wheezes.  Decreased air entry bilaterally.  No wheeze.  Abdominal: Soft. Bowel sounds are normal. He exhibits no distension and no mass. There is no abdominal tenderness. There is no rebound and no guarding.  Musculoskeletal: Normal range of motion.        General: No tenderness or edema.  Neurological: He is alert and  oriented to person, place, and time.  Skin: Skin is warm.  Psychiatric: Affect normal.     LABORATORY DATA:  I have reviewed the data as listed Lab Results  Component Value Date   WBC 9.0 02/12/2019   HGB 12.9 (L) 02/12/2019   HCT 38.1 (L) 02/12/2019   MCV 87.2 02/12/2019   PLT 226 02/12/2019   Recent Labs    07/12/18 0926  01/10/19 0932  02/10/19 1814 02/11/19 1104 02/12/19 0558  NA 135   < > 135   < > 137 137 136  K 4.2   < > 4.2   < > 3.9 3.2* 3.5  CL 103   < > 103   < > 107 105 109  CO2 23   < > 23   < > 19* 22 21*  GLUCOSE 120*   < > 101*   < > 119* 100* 96  BUN 11   < > 10   < > 23 20 16   CREATININE 0.83   < > 0.95   < > 0.88 0.88 0.70  CALCIUM 8.4*   < > 9.1   < > 8.9 8.7* 7.5*  GFRNONAA >60   < > >60   < > >60 >60 >60  GFRAA >60   < > >60   < > >60 >60 >60  PROT 7.0   < > 7.2   < > 6.8 7.1 5.8*  ALBUMIN 3.6   < > 4.1   < > 4.0 3.9 3.4*  AST 21   < > 21   < > 28 25 13*  ALT 20   < > 15   < > 23 20 17   ALKPHOS 66   < > 68   < > 59 53 46  BILITOT 1.8*   < > 1.3*   < > 1.3* 1.6* 1.6*  BILIDIR 0.3*  --  <0.1  --   --   --   --    < > = values in this interval not displayed.    RADIOGRAPHIC STUDIES: I have  personally reviewed the radiological images as listed and agreed with the findings in the report. Ct Chest W Contrast  Result Date: 01/28/2019 CLINICAL DATA:  Follow-up metastatic lung cancer EXAM: CT CHEST, ABDOMEN, AND PELVIS WITH CONTRAST TECHNIQUE: Multidetector CT imaging of the chest, abdomen and pelvis was performed following the standard protocol during bolus administration of intravenous contrast. CONTRAST:  168mL OMNIPAQUE IOHEXOL 300 MG/ML SOLN, additional oral enteric contrast COMPARISON:  11/02/2018, 08/26/2018 FINDINGS: CT CHEST FINDINGS Cardiovascular: Right chest port catheter. Mild aortic atherosclerosis. Coronary artery calcifications and/or stents. Normal heart size. No pericardial effusion. Mediastinum/Nodes: No change in right hilar soft tissue  and lymph nodes. Thyroid gland, trachea, and esophagus demonstrate no significant findings. Lungs/Pleura: Moderate centrilobular emphysema. Slight interval enlargement of a right lower lobe mass with central necrosis measuring 4.6 x 3.4 cm, previously 4.0 x 3.0 cm when measured similarly (series 4, image 78). No change in right upper lobe nodules, measuring 1.4 and 0.8 cm (series 4, image 35). There is almost no residua of left lung nodules, with irregular opacities in the apical left upper lobe (series 4, image 17) and superior segment left lower lobe (series 4, image 48). No pleural effusion or pneumothorax. Musculoskeletal: Redemonstrated vertebra plana deformity of T4. CT ABDOMEN PELVIS FINDINGS Hepatobiliary: No solid liver abnormality is seen. No gallstones, gallbladder wall thickening, or biliary dilatation. Pancreas: Unremarkable. No pancreatic ductal dilatation or surrounding inflammatory changes. Spleen: Normal in size without significant abnormality. Adrenals/Urinary Tract: Unchanged left adrenal nodule measuring 1.6 x 1.1 cm, previously characterized as non metabolic by PET-CT (series 2, image 56). Kidneys are normal, without renal calculi, solid lesion, or hydronephrosis. Bladder is unremarkable. Stomach/Bowel: Stomach is within normal limits. No evidence of bowel wall thickening, distention, or inflammatory changes. Sigmoid diverticulosis. Vascular/Lymphatic: No significant vascular findings are present. No enlarged abdominal or pelvic lymph nodes. Reproductive: No mass or other abnormality. Other: No abdominal wall hernia or abnormality. No abdominopelvic ascites. Musculoskeletal: No acute or significant osseous findings. IMPRESSION: 1. Slight interval enlargement of a right lower lobe mass with central necrosis measuring 4.6 x 3.4 cm, previously 4.0 x 3.0 cm when measured similarly (series 4, image 78). No change in right upper lobe nodules, measuring 1.4 and 0.8 cm (series 4, image 35). There is  almost no residua of left lung nodules, with irregular opacities in the apical left upper lobe (series 4, image 17) and superior segment left lower lobe (series 4, image 48). 2. No change in right hilar soft tissue and lymph nodes. 3. Redemonstrated vertebra plana deformity of T4. no evidence of new osseous metastatic disease. 4. No evidence of distant metastatic disease in the abdomen or pelvis. 5. Unchanged left adrenal nodule measuring 1.6 x 1.1 cm, previously characterized as non metabolic by PET-CT (series 2, image 56). 6.  Other chronic and incidental findings as detailed above. Electronically Signed   By: Eddie Candle M.D.   On: 01/28/2019 15:21   Ct Abdomen Pelvis W Contrast  Result Date: 02/11/2019 CLINICAL DATA:  Abdominal pain left upper and lower quadrant with nausea, vomiting and diarrhea. History of metastatic lung cancer on chemotherapy. EXAM: CT ABDOMEN AND PELVIS WITH CONTRAST TECHNIQUE: Multidetector CT imaging of the abdomen and pelvis was performed using the standard protocol following bolus administration of intravenous contrast. CONTRAST:  152mL OMNIPAQUE IOHEXOL 300 MG/ML  SOLN COMPARISON:  01/28/2019 and 11/02/2018 FINDINGS: Lower chest: Partial visualization of patient's known necrotic right lower lobe mass with scarring over the posterior right base. Calcified plaque over the  right coronary artery. Right-sided Port-A-Cath with tip over the right atrium unchanged. Hepatobiliary: 2 tiny subcentimeter liver hypodensities unchanged and too small to characterize but likely cysts. Gallbladder and biliary tree are normal. Pancreas: Normal. Spleen: Normal. Adrenals/Urinary Tract: Stable 1.5 cm left adrenal nodule. Right adrenal gland is normal. Kidneys are normal in size without hydronephrosis or nephrolithiasis. Several bilateral renal cysts. Ureters and bladder are normal. Stomach/Bowel: Stomach is normal. Mild wall thickening of the duodenum. Possible mild wall thickening over several  proximal jejunal loops. Appendix is normal. Minimal diverticulosis of the colon. Vascular/Lymphatic: Mild calcified plaque over the abdominal aorta which is normal in caliber. No adenopathy. Reproductive: Normal. Other: No significant free fluid. Musculoskeletal: Degenerative change of the spine. Mild degenerative change of the hips. IMPRESSION: Mild wall thickening involving the duodenum and possibly several proximal jejunal loops as findings may be due to duodenitis or regional enteritis of infectious or inflammatory nature. Partially visualized known large necrotic right lower lobe lung mass. Two tiny subcentimeter liver hypodensities unchanged and too small to characterize but likely cysts. Bilateral renal cysts. Minimal diverticulosis of the colon. Aortic Atherosclerosis (ICD10-I70.0). Atherosclerotic coronary artery disease. Electronically Signed   By: Marin Olp M.D.   On: 02/11/2019 15:10   Ct Abdomen Pelvis W Contrast  Result Date: 01/28/2019 CLINICAL DATA:  Follow-up metastatic lung cancer EXAM: CT CHEST, ABDOMEN, AND PELVIS WITH CONTRAST TECHNIQUE: Multidetector CT imaging of the chest, abdomen and pelvis was performed following the standard protocol during bolus administration of intravenous contrast. CONTRAST:  161mL OMNIPAQUE IOHEXOL 300 MG/ML SOLN, additional oral enteric contrast COMPARISON:  11/02/2018, 08/26/2018 FINDINGS: CT CHEST FINDINGS Cardiovascular: Right chest port catheter. Mild aortic atherosclerosis. Coronary artery calcifications and/or stents. Normal heart size. No pericardial effusion. Mediastinum/Nodes: No change in right hilar soft tissue and lymph nodes. Thyroid gland, trachea, and esophagus demonstrate no significant findings. Lungs/Pleura: Moderate centrilobular emphysema. Slight interval enlargement of a right lower lobe mass with central necrosis measuring 4.6 x 3.4 cm, previously 4.0 x 3.0 cm when measured similarly (series 4, image 78). No change in right upper lobe  nodules, measuring 1.4 and 0.8 cm (series 4, image 35). There is almost no residua of left lung nodules, with irregular opacities in the apical left upper lobe (series 4, image 17) and superior segment left lower lobe (series 4, image 48). No pleural effusion or pneumothorax. Musculoskeletal: Redemonstrated vertebra plana deformity of T4. CT ABDOMEN PELVIS FINDINGS Hepatobiliary: No solid liver abnormality is seen. No gallstones, gallbladder wall thickening, or biliary dilatation. Pancreas: Unremarkable. No pancreatic ductal dilatation or surrounding inflammatory changes. Spleen: Normal in size without significant abnormality. Adrenals/Urinary Tract: Unchanged left adrenal nodule measuring 1.6 x 1.1 cm, previously characterized as non metabolic by PET-CT (series 2, image 56). Kidneys are normal, without renal calculi, solid lesion, or hydronephrosis. Bladder is unremarkable. Stomach/Bowel: Stomach is within normal limits. No evidence of bowel wall thickening, distention, or inflammatory changes. Sigmoid diverticulosis. Vascular/Lymphatic: No significant vascular findings are present. No enlarged abdominal or pelvic lymph nodes. Reproductive: No mass or other abnormality. Other: No abdominal wall hernia or abnormality. No abdominopelvic ascites. Musculoskeletal: No acute or significant osseous findings. IMPRESSION: 1. Slight interval enlargement of a right lower lobe mass with central necrosis measuring 4.6 x 3.4 cm, previously 4.0 x 3.0 cm when measured similarly (series 4, image 78). No change in right upper lobe nodules, measuring 1.4 and 0.8 cm (series 4, image 35). There is almost no residua of left lung nodules, with irregular opacities in the apical  left upper lobe (series 4, image 17) and superior segment left lower lobe (series 4, image 48). 2. No change in right hilar soft tissue and lymph nodes. 3. Redemonstrated vertebra plana deformity of T4. no evidence of new osseous metastatic disease. 4. No evidence  of distant metastatic disease in the abdomen or pelvis. 5. Unchanged left adrenal nodule measuring 1.6 x 1.1 cm, previously characterized as non metabolic by PET-CT (series 2, image 56). 6.  Other chronic and incidental findings as detailed above. Electronically Signed   By: Eddie Candle M.D.   On: 01/28/2019 15:21    Primary cancer of right lower lobe of lung [facility]) #68 year old male patient with metastatic lung cancer most recently on chemoimmunotherapy is admitted the hospital for nausea vomiting abdominal pain severe diarrhea.  #Adenocarcinoma the lung stage IV-status post carbo Alimta Keytruda-cycle #1; day #6 today   No obvious evidence of clinical progression of his lung cancer.  See discussion below.   #Abdominal discomfort/nausea vomiting diarrhea-likely secondary to enteritis as noted on the CT scan.  The etiology is unclear-infectious versus inflammatory.  The fact that patient had the onset of symptoms just 2 days after his treatments-I do not suspect Keytruda as a possible etiology.  Possibly carbo/Alimta as etiology versus infectious etiology.  Patient is clinically improving today.  Reasonable to taper the antibiotics to Zosyn only.   #Back pain/bone mets-pain is currently stable.  # Thank you Dr. Verdell Carmine for allowing me to participate in the care of your pleasant patient. Please do not hesitate to contact me with questions or concerns in the interim.  Discussed with Dr. Verdell Carmine.   # addendum: Updated pt's friend, [name] of patient's clinical status.    All questions were answered. The patient knows to call the clinic with any problems, questions or concerns.    Cammie Sickle, MD 02/12/2019 4:35 PM

## 2019-02-13 LAB — PROCALCITONIN: Procalcitonin: <0.1 ng/mL

## 2019-02-13 MED ORDER — LEVOFLOXACIN 500 MG PO TABS: 500.0000 mg | ORAL_TABLET | Freq: Every day | ORAL | 0 refills | Status: AC

## 2019-02-13 MED ORDER — HEPARIN SOD (PORK) LOCK FLUSH 100 UNIT/ML IV SOLN
INTRAVENOUS | Status: AC
  Administered 2019-02-13: 16:00:00 500 [IU]
  Filled 2019-02-13: qty 5

## 2019-02-13 NOTE — Progress Notes (Signed)
[place] [facility] [place] NAME: Jimmy West    MR#:  [id number]  DATE OF BIRTH:  [birth year]  SUBJECTIVE:   Patient admitted to the hospital secondary to abdominal pain nausea and vomiting and suspected to have enteritis/colitis.    Abdominal pain improved since yesterday, patient denies any nausea or vomiting.  He is tolerating a clear liquid diet well.  Afebrile.  REVIEW OF SYSTEMS:    Review of Systems  Constitutional: Negative for chills and fever.  HENT: Negative for congestion and tinnitus.   Eyes: Negative for blurred vision and double vision.  Respiratory: Negative for cough, shortness of breath and wheezing.   Cardiovascular: Negative for chest pain, orthopnea and PND.  Gastrointestinal: Positive for abdominal pain and nausea. Negative for diarrhea and vomiting.  Genitourinary: Negative for dysuria and hematuria.  Neurological: Negative for dizziness, sensory change and focal weakness.  All other systems reviewed and are negative.   Nutrition: Full liquids and will advance to regular today Tolerating Diet: Yes Tolerating PT: Ambulatory  DRUG ALLERGIES:  No Known Allergies  VITALS:  Blood pressure 103/62, pulse (!) 55, temperature 98.5 F (36.9 C), temperature source Oral, resp. rate 18, height 5\' 6"  (1.676 m), weight 68 kg, SpO2 96 %.  PHYSICAL EXAMINATION:   Physical Exam  GENERAL:  68 y.o.-year-old patient lying in bed in no acute distress.  EYES: Pupils equal, round, reactive to light and accommodation. No scleral icterus. Extraocular muscles intact.  HEENT: Head atraumatic, normocephalic. Oropharynx and nasopharynx clear.  NECK:  Supple, no jugular venous distention. No thyroid enlargement, no tenderness.  LUNGS: Normal breath sounds bilaterally, no wheezing, rales, rhonchi. No use of accessory muscles of respiration.  CARDIOVASCULAR: S1, S2 normal. No murmurs, rubs, or gallops.  ABDOMEN: Soft, Nontender,   nondistended. Bowel sounds present. No organomegaly or mass.  EXTREMITIES: No cyanosis, clubbing or edema b/l.    NEUROLOGIC: Cranial nerves II through XII are intact. No focal Motor or sensory deficits b/l.   PSYCHIATRIC: The patient is alert and oriented x 3.  SKIN: No obvious rash, lesion, or ulcer.    LABORATORY PANEL:   CBC Recent Labs  Lab 02/12/19 0558  WBC 9.0  HGB 12.9*  HCT 38.1*  PLT 226   ------------------------------------------------------------------------------------------------------------------  Chemistries  Recent Labs  Lab 02/12/19 0558  NA 136  K 3.5  CL 109  CO2 21*  GLUCOSE 96  BUN 16  CREATININE 0.70  CALCIUM 7.5*  MG 2.1  AST 13*  ALT 17  ALKPHOS 46  BILITOT 1.6*   ------------------------------------------------------------------------------------------------------------------  Cardiac Enzymes No results for input(s): TROPONINI in the last 168 hours. ------------------------------------------------------------------------------------------------------------------  RADIOLOGY:  Ct Abdomen Pelvis W Contrast  Result Date: 02/11/2019 CLINICAL DATA:  Abdominal pain left upper and lower quadrant with nausea, vomiting and diarrhea. History of metastatic lung cancer on chemotherapy. EXAM: CT ABDOMEN AND PELVIS WITH CONTRAST TECHNIQUE: Multidetector CT imaging of the abdomen and pelvis was performed using the standard protocol following bolus administration of intravenous contrast. CONTRAST:  162mL OMNIPAQUE IOHEXOL 300 MG/ML  SOLN COMPARISON:  01/28/2019 and 11/02/2018 FINDINGS: Lower chest: Partial visualization of patient's known necrotic right lower lobe mass with scarring over the posterior right base. Calcified plaque over the right coronary artery. Right-sided Port-A-Cath with tip over the right atrium unchanged. Hepatobiliary: 2 tiny subcentimeter liver hypodensities unchanged and too small to characterize but likely cysts. Gallbladder and  biliary tree are normal. Pancreas: Normal. Spleen: Normal. Adrenals/Urinary Tract: Stable  1.5 cm left adrenal nodule. Right adrenal gland is normal. Kidneys are normal in size without hydronephrosis or nephrolithiasis. Several bilateral renal cysts. Ureters and bladder are normal. Stomach/Bowel: Stomach is normal. Mild wall thickening of the duodenum. Possible mild wall thickening over several proximal jejunal loops. Appendix is normal. Minimal diverticulosis of the colon. Vascular/Lymphatic: Mild calcified plaque over the abdominal aorta which is normal in caliber. No adenopathy. Reproductive: Normal. Other: No significant free fluid. Musculoskeletal: Degenerative change of the spine. Mild degenerative change of the hips. IMPRESSION: Mild wall thickening involving the duodenum and possibly several proximal jejunal loops as findings may be due to duodenitis or regional enteritis of infectious or inflammatory nature. Partially visualized known large necrotic right lower lobe lung mass. Two tiny subcentimeter liver hypodensities unchanged and too small to characterize but likely cysts. Bilateral renal cysts. Minimal diverticulosis of the colon. Aortic Atherosclerosis (ICD10-I70.0). Atherosclerotic coronary artery disease. Electronically Signed   By: Marin Olp M.D.   On: 02/11/2019 15:10     ASSESSMENT AND PLAN:   68 year old male with past medical history of stage IV lung cancer currently ongoing immunotherapy and chemotherapy, previous history of an MI who presents to the hospital due to abdominal pain nausea and vomiting.  1.  Abdominal pain nausea and vomiting- suspected to be secondary to some mild duodenitis/enteritis. - Questionable of this is related to the patient's underlying chemotherapy that he just received few days prior to admission. -Continue empiric Zosyn.  Tolerating full liquid diet will advance to regular today. -If improving consider discharge later today or tomorrow.  Will discharge  on oral Levaquin.  2. Stage IV lung cancer- patient currently getting immunotherapy and chemotherapy. -Seen by oncology and no acute changes and continue further care as per them.  3.  Depression-continue Prozac.  4.  Chronic pain-secondary to malignancy. -Continue fentanyl patch.  5.  GERD-continue Protonix.  Possible discharge later today if patient feels better.   All the records are reviewed and case discussed with Care Management/Social Worker. Management plans discussed with the patient, family and they are in agreement.  CODE STATUS: Full code  DVT Prophylaxis: Lovenox  TOTAL TIME TAKING CARE OF THIS PATIENT: 52minutes.   POSSIBLE D/C either later today or tomorrow DEPENDING ON CLINICAL CONDITION.   Henreitta Leber M.D on 02/13/2019 at 1:12 PM  Between 7am to 6pm - Pager - [phone]  After 6pm go to [email or web address] - [employer] [facility] [facility]  Office  [phone]  CC: Primary care physician; [name], NP

## 2019-02-13 NOTE — Discharge Summary (Signed)
[place] at [place] NAME: Jimmy West    MR#:  [id number]  DATE OF BIRTH:  [birth year]  DATE OF ADMISSION:  02/11/2019 ADMITTING PHYSICIAN: Lang Snow, NP  DATE OF DISCHARGE: 02/13/2019  PRIMARY CARE PHYSICIAN: [name], NP    ADMISSION DIAGNOSIS:  Chemo induced vomiting and diarrhea  DISCHARGE DIAGNOSIS:  Active Problems:   Primary cancer of right lower lobe of lung (HCC)   Abdominal pain   Chemotherapy induced nausea and vomiting   SECONDARY DIAGNOSIS:   Past Medical History:  Diagnosis Date  . Bulging lumbar disc   . Cancer ([place])    stage 4 lung cancer  . Heart attack [facility])     HOSPITAL COURSE:   68 year old male with past medical history of stage IV lung cancer currently ongoing immunotherapy and chemotherapy, previous history of an MI who presents to the hospital due to abdominal pain nausea and vomiting.  1.  Abdominal pain nausea and vomiting- suspected to be secondary to some mild duodenitis/enteritis. -This was thought to be related to patient's underlying recent chemotherapy.  Patient was admitted to the hospital given IV fluids empiric Zosyn and has improved.  His diet was slowly advanced from a clear to full and now to a regular diet which he is tolerating without any worsening abdominal pain nausea or vomiting. -He is afebrile and hemodynamically stable.  He will be discharged on oral Levaquin for a few days.  2. Stage IV lung cancer- patient currently getting immunotherapy and chemotherapy. -Seen by oncology and no acute changes and continue follow up with them as outpatient.   3.  Depression- pt. Will continue Prozac.  4.  Chronic pain-secondary to malignancy. - pt. Will Continue fentanyl patch.  5.  GERD-continue Protonix.  Stable for discharge home today.   DISCHARGE CONDITIONS:   Stable  CONSULTS OBTAINED:    DRUG ALLERGIES:  No Known Allergies  DISCHARGE MEDICATIONS:    Allergies as of 02/13/2019   No Known Allergies     Medication List    TAKE these medications   acetaminophen 500 MG tablet Commonly known as: TYLENOL Take 500 mg by mouth 3 (three) times daily as needed.   aspirin EC 81 MG tablet Take 81 mg by mouth daily.   Calcium 600-400 MG-UNIT Chew Chew 2 tablets by mouth daily.   dexamethasone 4 MG tablet Commonly known as: DECADRON Take 1 tab two times a day the day before Alimta chemo, then take 2 tabs once a day for 3 days starting the day after chemo.   fentaNYL 75 MCG/HR Commonly known as: [place] 1 patch onto the skin every 3 (three) days.   FLUoxetine 20 MG capsule Commonly known as: PROZAC Take 1 capsule (20 mg total) by mouth daily.   folic acid 1 MG tablet Commonly known as: FOLVITE Take 1 tablet (1 mg total) by mouth daily. Start 5-7 days before Alimta chemotherapy. Continue until 21 days after Alimta completed.   ibuprofen 200 MG tablet Commonly known as: ADVIL Take 200 mg by mouth every 6 (six) hours as needed.   levofloxacin 500 MG tablet Commonly known as: LEVAQUIN Take 1 tablet (500 mg total) by mouth daily for 5 days.   naloxone 4 MG/0.1ML Liqd nasal spray kit Commonly known as: NARCAN 1 spray by nose in the event of opioid overdose. Call EMS (911) immediately if medication is used.   ondansetron 4 MG disintegrating tablet Commonly known as: Zofran ODT  Take 1 tablet (4 mg total) by mouth every 8 (eight) hours as needed for nausea or vomiting.   Oxycodone HCl 20 MG Tabs Take 0.5-1 tablets (10-20 mg total) by mouth every 4 (four) hours as needed (for severe pain).   pembrolizumab 100 MG/4ML Soln Commonly known as: KEYTRUDA Inject 2 mg/kg into the vein every 30 (thirty) days.   polyethylene glycol 17 g packet Commonly known as: MIRALAX / GLYCOLAX Take 17 g by mouth daily.   senna 8.6 MG tablet Commonly known as: SENOKOT Take 1 tablet by mouth as needed.   traZODone 50 MG tablet Commonly  known as: DESYREL Take 2 tablets (100 mg total) by mouth at bedtime.         DISCHARGE INSTRUCTIONS:   DIET:  Regular diet  DISCHARGE CONDITION:  Stable  ACTIVITY:  Activity as tolerated  OXYGEN:  Home Oxygen: No.   Oxygen Delivery: room air  DISCHARGE LOCATION:  home   If you experience worsening of your admission symptoms, develop shortness of breath, life threatening emergency, suicidal or homicidal thoughts you must seek medical attention immediately by calling 911 or calling your MD immediately  if symptoms less severe.  You Must read complete instructions/literature along with all the possible adverse reactions/side effects for all the Medicines you take and that have been prescribed to you. Take any new Medicines after you have completely understood and accpet all the possible adverse reactions/side effects.   Please note  You were cared for by a hospitalist during your hospital stay. If you have any questions about your discharge medications or the care you received while you were in the hospital after you are discharged, you can call the unit and asked to speak with the hospitalist on call if the hospitalist that took care of you is not available. Once you are discharged, your primary care physician will handle any further medical issues. Please note that NO REFILLS for any discharge medications will be authorized once you are discharged, as it is imperative that you return to your primary care physician (or establish a relationship with a primary care physician if you do not have one) for your aftercare needs so that they can reassess your need for medications and monitor your lab values.    DATA REVIEW:   CBC Recent Labs  Lab 02/12/19 0558  WBC 9.0  HGB 12.9*  HCT 38.1*  PLT 226    Chemistries  Recent Labs  Lab 02/12/19 0558  NA 136  K 3.5  CL 109  CO2 21*  GLUCOSE 96  BUN 16  CREATININE 0.70  CALCIUM 7.5*  MG 2.1  AST 13*  ALT 17  ALKPHOS 46   BILITOT 1.6*    Cardiac Enzymes No results for input(s): TROPONINI in the last 168 hours.  Microbiology Results  Results for orders placed or performed during the hospital encounter of 02/11/19  CULTURE, BLOOD (ROUTINE X 2) w Reflex to ID Panel     Status: None (Preliminary result)   Collection Time: 02/11/19  5:54 PM   Specimen: BLOOD  Result Value Ref Range Status   Specimen Description BLOOD BLOOD RIGHT HAND  Final   Special Requests   Final    BOTTLES DRAWN AEROBIC AND ANAEROBIC Blood Culture adequate volume   Culture   Final    NO GROWTH 2 DAYS Performed at [facility], [address], [place], [place] [address]    Report Status PENDING  Incomplete  CULTURE, BLOOD (ROUTINE X 2)  w Reflex to ID Panel     Status: None (Preliminary result)   Collection Time: 02/11/19  5:54 PM   Specimen: BLOOD  Result Value Ref Range Status   Specimen Description BLOOD BLOOD LEFT HAND  Final   Special Requests   Final    BOTTLES DRAWN AEROBIC AND ANAEROBIC Blood Culture results may not be optimal due to an inadequate volume of blood received in culture bottles   Culture   Final    NO GROWTH 2 DAYS Performed at [facility], [address], [place],  [address]    Report Status PENDING  Incomplete  SARS Coronavirus 2 (CEPHEID - Performed in [place] hospital lab), Hosp Order     Status: None   Collection Time: 02/11/19  5:59 PM   Specimen: Nasopharyngeal Swab  Result Value Ref Range Status   SARS Coronavirus 2 NEGATIVE NEGATIVE Final    Comment: (NOTE) If result is NEGATIVE SARS-CoV-2 target nucleic acids are NOT DETECTED. The SARS-CoV-2 RNA is generally detectable in upper and lower  respiratory specimens during the acute phase of infection. The lowest  concentration of SARS-CoV-2 viral copies this assay can detect is 250  copies / mL. A negative result does not preclude SARS-CoV-2 infection  and should not be used as the sole basis for treatment or other   patient management decisions.  A negative result may occur with  improper specimen collection / handling, submission of specimen other  than nasopharyngeal swab, presence of viral mutation(s) within the  areas targeted by this assay, and inadequate number of viral copies  (<250 copies / mL). A negative result must be combined with clinical  observations, patient history, and epidemiological information. If result is POSITIVE SARS-CoV-2 target nucleic acids are DETECTED. The SARS-CoV-2 RNA is generally detectable in upper and lower  respiratory specimens dur ing the acute phase of infection.  Positive  results are indicative of active infection with SARS-CoV-2.  Clinical  correlation with patient history and other diagnostic information is  necessary to determine patient infection status.  Positive results do  not rule out bacterial infection or co-infection with other viruses. If result is PRESUMPTIVE POSTIVE SARS-CoV-2 nucleic acids MAY BE PRESENT.   A presumptive positive result was obtained on the submitted specimen  and confirmed on repeat testing.  While 2019 novel coronavirus  (SARS-CoV-2) nucleic acids may be present in the submitted sample  additional confirmatory testing may be necessary for epidemiological  and / or clinical management purposes  to differentiate between  SARS-CoV-2 and other Sarbecovirus currently known to infect humans.  If clinically indicated additional testing with an alternate test  methodology [id number]) is advised. The SARS-CoV-2 RNA is generally  detectable in upper and lower respiratory sp ecimens during the acute  phase of infection. The expected result is Negative. Fact Sheet for Patients:  StrictlyIdeas.no Fact Sheet for Healthcare Providers: BankingDealers.co.za This test is not yet approved or cleared by the [place] FDA and has been authorized for detection and/or diagnosis of SARS-CoV-2  by FDA under an Emergency Use Authorization (EUA).  This EUA will remain in effect (meaning this test can be used) for the duration of the COVID-19 declaration under Section 564(b)(1) of the Act, 21 U.S.C. section 360bbb-3(b)(1), unless the authorization is terminated or revoked sooner. Performed at [facility], [address], [place],  [address]     RADIOLOGY:  Ct Abdomen Pelvis W Contrast  Result Date: 02/11/2019 CLINICAL DATA:  Abdominal pain left upper  and lower quadrant with nausea, vomiting and diarrhea. History of metastatic lung cancer on chemotherapy. EXAM: CT ABDOMEN AND PELVIS WITH CONTRAST TECHNIQUE: Multidetector CT imaging of the abdomen and pelvis was performed using the standard protocol following bolus administration of intravenous contrast. CONTRAST:  161m OMNIPAQUE IOHEXOL 300 MG/ML  SOLN COMPARISON:  01/28/2019 and 11/02/2018 FINDINGS: Lower chest: Partial visualization of patient's known necrotic right lower lobe mass with scarring over the posterior right base. Calcified plaque over the right coronary artery. Right-sided Port-A-Cath with tip over the right atrium unchanged. Hepatobiliary: 2 tiny subcentimeter liver hypodensities unchanged and too small to characterize but likely cysts. Gallbladder and biliary tree are normal. Pancreas: Normal. Spleen: Normal. Adrenals/Urinary Tract: Stable 1.5 cm left adrenal nodule. Right adrenal gland is normal. Kidneys are normal in size without hydronephrosis or nephrolithiasis. Several bilateral renal cysts. Ureters and bladder are normal. Stomach/Bowel: Stomach is normal. Mild wall thickening of the duodenum. Possible mild wall thickening over several proximal jejunal loops. Appendix is normal. Minimal diverticulosis of the colon. Vascular/Lymphatic: Mild calcified plaque over the abdominal aorta which is normal in caliber. No adenopathy. Reproductive: Normal. Other: No significant free fluid. Musculoskeletal:  Degenerative change of the spine. Mild degenerative change of the hips. IMPRESSION: Mild wall thickening involving the duodenum and possibly several proximal jejunal loops as findings may be due to duodenitis or regional enteritis of infectious or inflammatory nature. Partially visualized known large necrotic right lower lobe lung mass. Two tiny subcentimeter liver hypodensities unchanged and too small to characterize but likely cysts. Bilateral renal cysts. Minimal diverticulosis of the colon. Aortic Atherosclerosis (ICD10-I70.0). Atherosclerotic coronary artery disease. Electronically Signed   By: DMarin OlpM.D.   On: 02/11/2019 15:10      Management plans discussed with the patient, family and they are in agreement.  CODE STATUS:     Code Status Orders  (From admission, onward)         Start     Ordered   02/11/19 1639  Full code  Continuous     02/11/19 1647        Code Status History    This patient has a current code status but no historical code status.   Advance Care Planning Activity      TOTAL TIME TAKING CARE OF THIS PATIENT: 40 minutes.    VHenreitta LeberM.D on 02/13/2019 at 2:37 PM  Between 7am to 6pm - Pager - [phone] After 6pm go to [email or web address] - [facility]ical [facility]  Office  [phone] CC: Primary care physician; CVenita Lick NP

## 2019-02-13 NOTE — Progress Notes (Signed)
Jimmy West   DOB:[birth year]   FV#:[id number]    Subjective: Patient denies any further episodes of nausea vomiting or diarrhea in the hospital.  Is able to tolerate diet.  Feels hungry.  He has been sitting in the chair yesterday.  Objective:  Vitals:   02/13/19 0533 02/13/19 1513  BP: 103/62 (!) 91/54  Pulse: (!) 55 68  Resp: 18 16  Temp: 98.5 F (36.9 C) 98.8 F (37.1 C)  SpO2: 96% 98%     Intake/Output Summary (Last 24 hours) at 02/13/2019 1957 Last data filed at 02/13/2019 1601 Gross per 24 hour  Intake 2085.52 ml  Output 200 ml  Net 1885.52 ml    Physical Exam  Constitutional: He is oriented to person, place, and time and well-developed, well-nourished, and in no distress.  HENT:  Head: Normocephalic and atraumatic.  Mouth/Throat: Oropharynx is clear and moist. No oropharyngeal exudate.  Eyes: Pupils are equal, round, and reactive to light.  Neck: Normal range of motion. Neck supple.  Cardiovascular: Normal rate and regular rhythm.  Pulmonary/Chest: Effort normal and breath sounds normal. No respiratory distress. He has no wheezes.  Abdominal: Soft. Bowel sounds are normal. He exhibits no distension and no mass. There is no abdominal tenderness. There is no rebound and no guarding.  Musculoskeletal: Normal range of motion.        General: No tenderness or edema.  Neurological: He is alert and oriented to person, place, and time.  Skin: Skin is warm.  Psychiatric: Affect normal.     Labs:  Lab Results  Component Value Date   WBC 9.0 02/12/2019   HGB 12.9 (L) 02/12/2019   HCT 38.1 (L) 02/12/2019   MCV 87.2 02/12/2019   PLT 226 02/12/2019   NEUTROABS 8.1 (H) 02/12/2019    Lab Results  Component Value Date   NA 136 02/12/2019   K 3.5 02/12/2019   CL 109 02/12/2019   CO2 21 (L) 02/12/2019    Studies:  No results found.  Primary cancer of right lower lobe of lung [facility]) #67 year old male patient with metastatic lung cancer most recently on  chemoimmunotherapy is admitted the hospital for nausea vomiting abdominal pain severe diarrhea.  #Adenocarcinoma the lung stage IV-status post carbo Alimta Keytruda-cycle #1; day #7 today.  No obvious clinical progression noted.    #Abdominal discomfort/nausea vomiting diarrhea-likely secondary to enteritis as noted on the CT scan.  The etiology is unclear; clinically improved.  Recommend discharging on Levaquin for 5 more days/total of 7 days.  #Back pain/bone mets-pain-stable.  #Discussed with the patient.  Discussed with Dr. Verdell Carmine.  # Addendum: Try to call patient's friend [name] - unable to Reach/ leave a message.     Cammie Sickle, MD 02/13/2019  7:57 PM

## 2019-02-13 NOTE — Plan of Care (Signed)
Care plan complete. Discharging today.

## 2019-02-13 NOTE — Progress Notes (Addendum)
Shift summary:  - Patient reports ongoing ABD pain. - Port dressing changed, needle placement verified. - Plan to discharge home today. - Dr. Gladstone Lighter aware of patient's lower BPs, however, patient is completely asymptomatic even with standing and walking. MD still okay with DC as long as patient feels okay (which he states he feels fine).

## 2019-02-13 NOTE — Progress Notes (Signed)
Patient discharged to home today. Port de-accessed and heparin-locked. DC'd on oral ABX. Belongings returned. All questions answered. Patient denies concerns at this time. Patient in stable condition and ready for DC. F/U appointment with Dr. Mike Gip later this week.

## 2019-02-13 NOTE — Progress Notes (Signed)
Initial Nutrition Assessment  RD working remotely.  DOCUMENTATION CODES:   Not applicable  INTERVENTION:  Recommend liberalizing diet to regular.  Provide Ensure Enlive po BID, each supplement provides 350 kcal and 20 grams of protein.  NUTRITION DIAGNOSIS:   Increased nutrient needs related to catabolic illness(stage IV lung cancer) as evidenced by estimated needs.  GOAL:   Patient will meet greater than or equal to 90% of their needs  MONITOR:   PO intake, Supplement acceptance, Labs, Weight trends, I & O's  REASON FOR ASSESSMENT:   Malnutrition Screening Tool    ASSESSMENT:   68 year old male with PMHx of stage IV lung cancer on chemoimmunotherapy admitted with abdominal pain/N/V suspected to be secondary to mild duodenitis/enteritis.   Spoke with patient over the phone. He reports his appetite has been decreased during treatment. He has been working with outpatient RD at cancer center to try and increase calories and protein. He adds cheese, mayonnaise, and other high-calorie foods to his meals. He tries to eat 2-3 meals per day. He also drinks Ensure 1-2 bottles per day. He would like to continue drinking Ensure during hospital stay.  Patient's UBW was 165 lbs. Per chart he was 165.9 lbs on 05/24/2018. On 7/23 patient was 68 kg (149.91 lbs). No true measured weight yet from this admission as weight was just copied forward from last encounter. Patient has lost 16 lbs (10% body weight) over the past almost 9 months, which is not significant for time frame but is still concerning.  Medications reviewed and include: calcium citrate 500 mg/vitamin D 500 units 2 tablets daily, fentanyl patch, folic acid 1 mg daily, pantoprazole, Miralax, NS at 75 mL/hr, Zosyn.  Labs reviewed: CO2 21.  Patient is at risk for malnutrition. Unable to determine if patient meets criteria for malnutrition without completing NFPE.  NUTRITION - FOCUSED PHYSICAL EXAM:  Unable to complete at this  time.  Diet Order:   Diet Order            Diet Heart Room service appropriate? Yes; Fluid consistency: Thin  Diet effective now        Diet general             EDUCATION NEEDS:   No education needs have been identified at this time  Skin:  Skin Assessment: Reviewed RN Assessment  Last BM:  02/12/2019 - small type 5  Height:   Ht Readings from Last 1 Encounters:  02/12/19 5\' 6"  (1.676 m)   Weight:   Wt Readings from Last 1 Encounters:  02/12/19 68 kg   Ideal Body Weight:  64.5 kg  BMI:  Body mass index is 24.2 kg/m.  Estimated Nutritional Needs:   Kcal:  5462-7035  Protein:  90-100 grams  Fluid:  1.8-2 L/day  Willey Blade, MS, RD, LDN Office: [phone] Pager: [phone] After Hours/Weekend Pager: [phone]

## 2019-02-13 NOTE — Plan of Care (Signed)

## 2019-02-14 ENCOUNTER — Other Ambulatory Visit: Payer: Self-pay

## 2019-02-14 ENCOUNTER — Telehealth: Payer: Self-pay

## 2019-02-14 LAB — HIV ANTIBODY (ROUTINE TESTING W REFLEX): HIV Screen 4th Generation wRfx: NONREACTIVE

## 2019-02-14 MED ORDER — ONDANSETRON 4 MG PO TBDP: 4.0000 mg | ORAL_TABLET | Freq: Four times a day (QID) | ORAL | 1 refills | Status: DC | PRN

## 2019-02-14 NOTE — Telephone Encounter (Signed)
Transition Care Management Follow-up Telephone Call  Date of discharge and from where: [facility] on 02/13/19  How have you been since you were released from the hospital? Doing better just tired and weak. Has not had an appetite since changing from liquid to a normal diet. Declines fever, pain or n/v.d.  Any questions or concerns? No   Items Reviewed:  Did the pt receive and understand the discharge instructions provided? Yes   Medications obtained and verified? Declined reviewing over the phone.  Any new allergies since your discharge? No   Dietary orders reviewed? Yes  Do you have support at home? Yes   Other (ie: DME, Home Health, etc) N/A  Functional Questionnaire: (I = Independent and D = Dependent)  Bathing/Dressing- I   Meal Prep- I  Eating- I  Maintaining continence- I  Transferring/Ambulation- I  Managing Meds- I   Follow up appointments reviewed:    PCP Hospital f/u appt confirmed? No  pt wants to f/u with oncologist only.  [facility] f/u appt confirmed? Yes with oncologists.  Are transportation arrangements needed? No   If their condition worsens, is the pt aware to call  their PCP or go to the ED? Yes  Was the patient provided with contact information for the PCP's office or ED? Yes  Was the pt encouraged to call back with questions or concerns? Yes

## 2019-02-14 NOTE — Telephone Encounter (Signed)
The patient was also told that he shouldn't be taking any steroids at this time. The patient agreed he is not taking any steroids at this time.

## 2019-02-14 NOTE — Telephone Encounter (Signed)
Spoke with the patient to see if he has been eating and drinking. The patient states hes has been eating and drink better, but still weak and tired the patient was agreeable to MD on Wednesday. The patient has been schedule 02/15/2019 at 11:00 AM.

## 2019-02-15 ENCOUNTER — Other Ambulatory Visit: Payer: Self-pay

## 2019-02-15 NOTE — Progress Notes (Signed)
[facility]  [address], [address] [place], [place] [address] Phone: [phone]  Fax: [phone]   Clinic Day:  02/16/2019  Referring physician: [name], NP  Chief Complaint: Jimmy West is a 68 y.o. male with metastatic adenocarcinoma of the lung who is seen for assessment following interval hospitalization on day 9 s/p cycle #1 carboplatin, Alimta and pembrolizumab.   HPI: The patient was last seen in the medical oncology clinic on 02/07/2019. At that time, he was doing well.  Back pain was well controlled.  He was working and sometimes Marketing executive.  He forgot to take his steroids.  He returned the following day for chemotherapy.  He received cycle #1 carboplatin, Alimta and pembrolizumab on 02/08/2019.   He was seen in the [facility] ED on 02/10/2019 for nausea, vomiting, and diarrhea. He denied abdominal pain. He was given IVF, Ativan, trazadone, and discharged.   He was seen in [facility] by Beckey Rutter, NP on 02/11/2019 for nausea, vomiting, and diarrhea with worsening abdominal pain.   Abdomen and pelvis CT revealed mild wall thickening involving the duodenum and possibly several proximal jejunal loops as findings may be due to duodenitis or regional enteritis of infectious or inflammatory nature. Partially visualized known large necrotic right lower lobe lung mass. Two tiny subcentimeter liver hypodensities unchanged and too small to characterize but likely cysts. Bilateral renal cysts. Minimal diverticulosis of the colon.  He was admitted to [facility] from 02/11/2019 - 02/13/2019. He was given IV fluids and empiric Zosyn. Diet was advanced.  He was discharged on oral Levaquin.   During the interim, he is doing okay. He denies any further nausea, diarrhea, or vomiting. He denies any runny nose, mouth sores, or sore throat. He denies any abdominal pain. He notes stomach cramps last night. He has increased back pain, worse when  laying on his back or with pressure. He reports one episode of cold sweats this morning.   He has started to increase his food intake but continues not to eat or drink much. He is down 9 lbs in the clinic today. He is fatigued and spends most of the day resting. He denies any dizziness or lightheadedness upon standing.     Past Medical History:  Diagnosis Date  . Bulging lumbar disc   . Cancer ([place])    stage 4 lung cancer  . Heart attack [facility])     Past Surgical History:  Procedure Laterality Date  . CARDIAC CATHETERIZATION     two stents  . KNEE SURGERY Left   . PORTA CATH INSERTION N/A 05/21/2018   Procedure: PORTA CATH INSERTION;  Surgeon: Algernon Huxley, MD;  Location: [facility];  Service: Cardiovascular;  Laterality: N/A;    Family History  Problem Relation Age of Onset  . Heart failure Father   . Cancer Maternal Aunt   . Heart failure Maternal Uncle   . Dementia Paternal Grandmother   . Cancer Maternal Aunt     Social History:  reports that he quit smoking about 15 years ago. His smoking use included cigarettes. He has a 22.50 pack-year smoking history. He has never used smokeless tobacco. He reports current alcohol use of about 15.0 standard drinks of alcohol per week. He reports that he does not use drugs. He started smoking at age 45. He is smoking 1 1/2 packs/day. 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 Patient denies known exposures to radiation ortoxins.The patient is alone today.  Allergies: No Known Allergies  Current Medications: Current Outpatient Medications  Medication Sig Dispense Refill  . aspirin EC 81 MG tablet Take 81 mg by mouth daily.    . Calcium 600-400 MG-UNIT CHEW Chew 2 tablets by mouth daily.    . fentaNYL (DURAGESIC) 75 MCG/HR Place 1 patch onto  the skin every 3 (three) days. 10 patch 0  . FLUoxetine (PROZAC) 20 MG capsule Take 1 capsule (20 mg total) by mouth daily. 30 capsule 3  . folic acid (FOLVITE) 1 MG tablet Take 1 tablet (1 mg total) by mouth daily. Start 5-7 days before Alimta chemotherapy. Continue until 21 days after Alimta completed. 100 tablet 3  . ondansetron (ZOFRAN ODT) 4 MG disintegrating tablet Take 1 tablet (4 mg total) by mouth every 6 (six) hours as needed for nausea or vomiting. 30 tablet 1  . Oxycodone HCl 20 MG TABS Take 0.5-1 tablets (10-20 mg total) by mouth every 4 (four) hours as needed (for severe pain). 90 tablet 0  . pembrolizumab (KEYTRUDA) 100 MG/4ML SOLN Inject 2 mg/kg into the vein every 30 (thirty) days.     . traZODone (DESYREL) 50 MG tablet Take 2 tablets (100 mg total) by mouth at bedtime. 30 tablet 0  . acetaminophen (TYLENOL) 500 MG tablet Take 500 mg by mouth 3 (three) times daily as needed.    Marland Kitchen dexamethasone (DECADRON) 4 MG tablet Take 1 tab two times a day the day before Alimta chemo, then take 2 tabs once a day for 3 days starting the day after chemo. (Patient not taking: Reported on 02/16/2019) 30 tablet 1  . ibuprofen (ADVIL,MOTRIN) 200 MG tablet Take 200 mg by mouth every 6 (six) hours as needed.    Marland Kitchen levofloxacin (LEVAQUIN) 500 MG tablet Take 1 tablet (500 mg total) by mouth daily for 5 days. (Patient not taking: Reported on 02/16/2019) 5 tablet 0  . naloxone (NARCAN) nasal spray 4 mg/0.1 mL 1 spray by nose in the event of opioid overdose. Call EMS (911) immediately if medication is used. (Patient not taking: Reported on 02/16/2019) 1 kit 0  . polyethylene glycol (MIRALAX / GLYCOLAX) packet Take 17 g by mouth daily.    Marland Kitchen senna (SENOKOT) 8.6 MG tablet Take 1 tablet by mouth as needed.      No current facility-administered medications for this visit.     Review of Systems  Constitutional: Positive for diaphoresis (cold sweats, 1 episode this morning), malaise/fatigue and weight loss (9lbs).  Negative for chills and fever.       Feeling a little better since discharge from the hospital.  HENT: Negative.  Negative for congestion, ear pain, nosebleeds, sinus pain and sore throat.   Eyes: Negative.  Negative for blurred vision, double vision, photophobia and pain.  Respiratory: Negative.  Negative for cough, hemoptysis, sputum production, shortness of breath and wheezing.   Cardiovascular: Negative.  Negative for chest pain, palpitations, orthopnea, leg swelling and PND.  Gastrointestinal: Positive for abdominal pain (last night, improving). Negative for blood in stool, constipation, diarrhea, heartburn, melena, nausea and vomiting.       Decreased appetite  Genitourinary: Negative.  Negative for dysuria, frequency, hematuria and urgency.  Musculoskeletal: Positive for back pain (well controlled). Negative for falls, joint pain, myalgias and neck pain.  Skin: Negative.  Negative for itching and rash.  Erythema at port site  Neurological: Positive for tingling (in mid back, occasional), sensory change (intermittent numbness in mid back; chronic numbness to LEFT heel 2/2 disc issues) and weakness (generalized). Negative for dizziness, tremors, speech change, focal weakness and headaches.  Endo/Heme/Allergies: Negative.  Does not bruise/bleed easily.  Psychiatric/Behavioral: Negative.  Negative for depression and memory loss. The patient is not nervous/anxious and does not have insomnia.   All other systems reviewed and are negative.  Performance status (ECOG): 2  Vitals Blood pressure 111/67, pulse 87, temperature (!) 96.7 F (35.9 C), temperature source Tympanic, resp. rate 18, height 5' 6"  (1.676 m), weight 145 lb 2.8 oz (65.9 kg), SpO2 100 %.   Physical Exam  Constitutional: He is oriented to person, place, and time.  HENT:  Head: Normocephalic and atraumatic.  Mouth/Throat: Oropharynx is clear and moist. He has dentures. No oropharyngeal exudate.  Brown hair with slight  graying.  Mustache.  Mask.    Eyes: Pupils are equal, round, and reactive to light. Conjunctivae and EOM are normal. No scleral icterus.  Glasses.  Gray blue eyes.  Neck: Normal range of motion. Neck supple. No JVD present.  Cardiovascular: Normal rate, regular rhythm and normal heart sounds. Exam reveals no gallop and no friction rub.  No murmur heard. Pulmonary/Chest: Effort normal and breath sounds normal. No respiratory distress. He has no wheezes. He has no rales.  Abdominal: Soft. Bowel sounds are normal. He exhibits no distension and no mass. There is no abdominal tenderness. There is no rebound and no guarding.  Musculoskeletal: Normal range of motion.        General: Tenderness (slight at T4) present. No edema.  Lymphadenopathy:    He has no cervical adenopathy.    He has no axillary adenopathy.       Right: No supraclavicular adenopathy present.       Left: No supraclavicular adenopathy present.  Neurological: He is alert and oriented to person, place, and time.  Skin: Skin is warm and dry. No rash noted. He is not diaphoretic. There is erythema (around port associated with taper irritation).  Psychiatric: He has a normal mood and affect. His behavior is normal. Judgment and thought content normal.  Nursing note and vitals reviewed.     Port-a-cath site   No visits with results within 3 Day(s) from this visit.  Latest known visit with results is:  Admission on 02/11/2019, Discharged on 02/13/2019  Component Date Value Ref Range Status  . HIV Screen 4th Generation wRfx 02/12/2019 Non Reactive  Non Reactive Final   Comment: (NOTE) Performed At: [facility] [address] [place], [place] [id number] Rush Farmer MD IR:[id number]   . Specimen Description 02/11/2019 BLOOD BLOOD RIGHT HAND   Final  . Special Requests 02/11/2019 BOTTLES DRAWN AEROBIC AND ANAEROBIC Blood Culture adequate volume   Final  . Culture 02/11/2019    Final                   Value:NO GROWTH  5 DAYS Performed at [facility], [address] [place], [place] [address]   . Report Status 02/11/2019 02/16/2019 FINAL   Final  . Specimen Description 02/11/2019 BLOOD BLOOD LEFT HAND   Final  . Special Requests 02/11/2019 BOTTLES DRAWN AEROBIC AND ANAEROBIC Blood Culture results may not be optimal due to an inadequate volume of blood received in culture bottles   Final  . Culture 02/11/2019    Final  Value:NO GROWTH 5 DAYS Performed at [facility], [address] [place], [place] [address]   . Report Status 02/11/2019 02/16/2019 FINAL   Final  . Procalcitonin 02/11/2019 <0.10  ng/mL Final   Comment:        Interpretation: PCT (Procalcitonin) <= 0.5 ng/mL: Systemic infection (sepsis) is not likely. Local bacterial infection is possible. (NOTE)       Sepsis PCT Algorithm           Lower Respiratory Tract                                      Infection PCT Algorithm    ----------------------------     ----------------------------         PCT < 0.25 ng/mL                PCT < 0.10 ng/mL         Strongly encourage             Strongly discourage   discontinuation of antibiotics    initiation of antibiotics    ----------------------------     -----------------------------       PCT 0.25 - 0.50 ng/mL            PCT 0.10 - 0.25 ng/mL               OR       >80% decrease in PCT            Discourage initiation of                                            antibiotics      Encourage discontinuation           of antibiotics    ----------------------------     -----------------------------         PCT >= 0.50 ng/mL              PCT 0.26 - 0.50 ng/mL               AND                                 <80% decrease in PCT             Encourage initiation of                                             antibiotics       Encourage continuation           of antibiotics    ----------------------------     -----------------------------        PCT >= 0.50  ng/mL                  PCT > 0.50 ng/mL               AND         increase in PCT                  Strongly encourage  initiation of antibiotics    Strongly encourage escalation           of antibiotics                                     -----------------------------                                           PCT <= 0.25 ng/mL                                                 OR                                        > 80% decrease in PCT                                     Discontinue / Do not initiate                                             antibiotics Performed at [facility], [address], [place], [place] [address]   . Procalcitonin 02/12/2019 <0.10  ng/mL Final   Comment:        Interpretation: PCT (Procalcitonin) <= 0.5 ng/mL: Systemic infection (sepsis) is not likely. Local bacterial infection is possible. (NOTE)       Sepsis PCT Algorithm           Lower Respiratory Tract                                      Infection PCT Algorithm    ----------------------------     ----------------------------         PCT < 0.25 ng/mL                PCT < 0.10 ng/mL         Strongly encourage             Strongly discourage   discontinuation of antibiotics    initiation of antibiotics    ----------------------------     -----------------------------       PCT 0.25 - 0.50 ng/mL            PCT 0.10 - 0.25 ng/mL               OR       >80% decrease in PCT            Discourage initiation of                                            antibiotics      Encourage discontinuation  of antibiotics    ----------------------------     -----------------------------         PCT >= 0.50 ng/mL              PCT 0.26 - 0.50 ng/mL               AND                                 <80% decrease in PCT             Encourage initiation of                                             antibiotics       Encourage continuation           of  antibiotics    ----------------------------     -----------------------------        PCT >= 0.50 ng/mL                  PCT > 0.50 ng/mL               AND         increase in PCT                  Strongly encourage                                      initiation of antibiotics    Strongly encourage escalation           of antibiotics                                     -----------------------------                                           PCT <= 0.25 ng/mL                                                 OR                                        > 80% decrease in PCT                                     Discontinue / Do not initiate                                             antibiotics Performed at [facility], [address] [place], [place] [address]   . SARS Coronavirus 2 02/11/2019 NEGATIVE  NEGATIVE Final   Comment: (NOTE) If result is NEGATIVE SARS-CoV-2 target nucleic acids are NOT DETECTED. The SARS-CoV-2 RNA is generally detectable in upper and lower  respiratory specimens during the acute phase of infection. The lowest  concentration of SARS-CoV-2 viral copies this assay can detect is 250  copies / mL. A negative result does not preclude SARS-CoV-2 infection  and should not be used as the sole basis for treatment or other  patient management decisions.  A negative result may occur with  improper specimen collection / handling, submission of specimen other  than nasopharyngeal swab, presence of viral mutation(s) within the  areas targeted by this assay, and inadequate number of viral copies  (<250 copies / mL). A negative result must be combined with clinical  observations, patient history, and epidemiological information. If result is POSITIVE SARS-CoV-2 target nucleic acids are DETECTED. The SARS-CoV-2 RNA is generally detectable in upper and lower  respiratory specimens dur                          ing the acute phase of infection.  Positive  results are  indicative of active infection with SARS-CoV-2.  Clinical  correlation with patient history and other diagnostic information is  necessary to determine patient infection status.  Positive results do  not rule out bacterial infection or co-infection with other viruses. If result is PRESUMPTIVE POSTIVE SARS-CoV-2 nucleic acids MAY BE PRESENT.   A presumptive positive result was obtained on the submitted specimen  and confirmed on repeat testing.  While 2019 novel coronavirus  (SARS-CoV-2) nucleic acids may be present in the submitted sample  additional confirmatory testing may be necessary for epidemiological  and / or clinical management purposes  to differentiate between  SARS-CoV-2 and other Sarbecovirus currently known to infect humans.  If clinically indicated additional testing with an alternate test  methodology [phone]) is advised. The SARS-CoV-2 RNA is generally  detectable in upper and lower respiratory sp                          ecimens during the acute  phase of infection. The expected result is Negative. Fact Sheet for Patients:  StrictlyIdeas.no Fact Sheet for Healthcare Providers: BankingDealers.co.za This test is not yet approved or cleared by the [place] FDA and has been authorized for detection and/or diagnosis of SARS-CoV-2 by FDA under an Emergency Use Authorization (EUA).  This EUA will remain in effect (meaning this test can be used) for the duration of the COVID-19 declaration under Section 564(b)(1) of the Act, 21 U.S.C. section 360bbb-3(b)(1), unless the authorization is terminated or revoked sooner. Performed at [facility], [address] [place], [place] [address]   . WBC 02/12/2019 9.0  4.0 - 10.5 K/uL Final  . RBC 02/12/2019 4.37  4.22 - 5.81 MIL/uL Final  . Hemoglobin 02/12/2019 12.9* 13.0 - 17.0 g/dL Final  . HCT 02/12/2019 38.1* 39.0 - 52.0 % Final  . MCV 02/12/2019 87.2  80.0 - 100.0  fL Final  . MCH 02/12/2019 29.5  26.0 - 34.0 pg Final  . MCHC 02/12/2019 33.9  30.0 - 36.0 g/dL Final  . RDW 02/12/2019 13.2  11.5 - 15.5 % Final  . Platelets 02/12/2019 226  150 - 400 K/uL Final  . nRBC 02/12/2019 0.0  0.0 - 0.2 % Final  . Neutrophils Relative % 02/12/2019 90  % Final  . Neutro Abs 02/12/2019 8.1*  1.7 - 7.7 K/uL Final  . Lymphocytes Relative 02/12/2019 8  % Final  . Lymphs Abs 02/12/2019 0.8  0.7 - 4.0 K/uL Final  . Monocytes Relative 02/12/2019 2  % Final  . Monocytes Absolute 02/12/2019 0.1  0.1 - 1.0 K/uL Final  . Eosinophils Relative 02/12/2019 0  % Final  . Eosinophils Absolute 02/12/2019 0.0  0.0 - 0.5 K/uL Final  . Basophils Relative 02/12/2019 0  % Final  . Basophils Absolute 02/12/2019 0.0  0.0 - 0.1 K/uL Final  . Immature Granulocytes 02/12/2019 0  % Final  . Abs Immature Granulocytes 02/12/2019 0.03  0.00 - 0.07 K/uL Final   Performed at [facility], [address] [place], [place] [address]  . Magnesium 02/12/2019 2.1  1.7 - 2.4 mg/dL Final   Performed at [facility], [address] [place], [place] [address]  . Sodium 02/12/2019 136  135 - 145 mmol/L Final  . Potassium 02/12/2019 3.5  3.5 - 5.1 mmol/L Final  . Chloride 02/12/2019 109  98 - 111 mmol/L Final  . CO2 02/12/2019 21* 22 - 32 mmol/L Final  . Glucose, Bld 02/12/2019 96  70 - 99 mg/dL Final  . BUN 02/12/2019 16  8 - 23 mg/dL Final  . Creatinine, Ser 02/12/2019 0.70  0.61 - 1.24 mg/dL Final  . Calcium 02/12/2019 7.5* 8.9 - 10.3 mg/dL Final  . Total Protein 02/12/2019 5.8* 6.5 - 8.1 g/dL Final  . Albumin 02/12/2019 3.4* 3.5 - 5.0 g/dL Final  . AST 02/12/2019 13* 15 - 41 U/L Final  . ALT 02/12/2019 17  0 - 44 U/L Final  . Alkaline Phosphatase 02/12/2019 46  38 - 126 U/L Final  . Total Bilirubin 02/12/2019 1.6* 0.3 - 1.2 mg/dL Final  . GFR calc non Af Amer 02/12/2019 >60  >60 mL/min Final  . GFR calc Af Amer 02/12/2019 >60  >60 mL/min Final  . Anion gap 02/12/2019 6  5 -  15 Final   Performed at [facility], [address] [place], [place] [address]  . Procalcitonin 02/13/2019 <0.10  ng/mL Final   Comment:        Interpretation: PCT (Procalcitonin) <= 0.5 ng/mL: Systemic infection (sepsis) is not likely. Local bacterial infection is possible. (NOTE)       Sepsis PCT Algorithm           Lower Respiratory Tract                                      Infection PCT Algorithm    ----------------------------     ----------------------------         PCT < 0.25 ng/mL                PCT < 0.10 ng/mL         Strongly encourage             Strongly discourage   discontinuation of antibiotics    initiation of antibiotics    ----------------------------     -----------------------------       PCT 0.25 - 0.50 ng/mL            PCT 0.10 - 0.25 ng/mL               OR       >80% decrease in PCT            Discourage  initiation of                                            antibiotics      Encourage discontinuation           of antibiotics    ----------------------------     -----------------------------         PCT >= 0.50 ng/mL              PCT 0.26 - 0.50 ng/mL               AND                                 <80% decrease in PCT             Encourage initiation of                                             antibiotics       Encourage continuation           of antibiotics    ----------------------------     -----------------------------        PCT >= 0.50 ng/mL                  PCT > 0.50 ng/mL               AND         increase in PCT                  Strongly encourage                                      initiation of antibiotics    Strongly encourage escalation           of antibiotics                                     -----------------------------                                           PCT <= 0.25 ng/mL                                                 OR                                        > 80% decrease in PCT                                      Discontinue / Do not initiate  antibiotics Performed at [facility], [address] [place], [place] [address]     Assessment:  Jimmy West is a 68 y.o. male with metastatichigh-grade adenocarcinomaof the right lungs/p CT-guided biopsy of a RLL lung mass on 05/11/2018. Pathologyrevealed an invasive high-grade adenocarcinoma, with predominantly solid growth pattern. The neoplastic cells were TTF-1 (+), Napsin A (+), and P40 (+). He has a T4 vertebral metastasis. Clinical stage is T4N1M1.  There was not enough material for [facility] One testing. PDL-1revealed TPS 90%.  PET scanon 04/30/2018 revealed a 3.4 cm hypermetabolic RLL pulmonary mass (SUV 13.2), 11 mm pulmonary nodule in the LEFT lung (SUV 4.5), RIGHT paratracheal lymph node (SUV 5.1), and hypermetabolic activity within the T4 vertebral body (SUV 10.2).   Thoracic spine MRIon 05/03/2018 revealed T4 metastasiswith a 40% pathologic compression deformity and 5 mm of retropulsion of the vertebral body. Retropulsion results in mild spinal canal stenosis and mild bilateral C4-5 foraminal stenosis. There was paravertebral soft tissue thickening from mid T3 to mid T5, likely representing edema related to the pathologic compression deformity vs. possible extraosseous extension of the neoplasm. There were no additional thoracic spinal metastases noted.   Head MRIon 05/03/2018 revealed no intracranial metastatic disease. 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 Incidental mention made of mild paranasal sinus disease.  Hereceived 11cycles ofpembrolizumab(05/24/2018 - 01/10/2019). He toleratedtreatment well. CEAwas 1.3 on 08/30/2018.LDHwas 165 on 11/29/2018.  He completed  T4 radiationon 07/07/2018. He receives Xgevamonthly (06/03/2018 - 01/10/2019).  He is currently day 9 s/p cycle #1 carboplatin, Alimta and pembrolizumab on 02/08/2019.   Cycle #1 was complicated by nausea, vomiting, and diarrhea necessitating hospitalization.  He hascancer-related painin T4. He is currently taking Fentanyl75 mcg/hr and oxycodone10 mg every 4 hours prn   Cervical and thoracic spine CT at [facility] 01/10/2019 revealed unchanged severe compression deformity/vertebral plana of T4 with a stable degree of retropulsion and focal mild spinal canal stenosis. There was interval enlargement of a right lower lobe pulmonary mass(4.5 x 3.5 cm compared to 3.9 x 2.9 cm)with redemonstrated spiculated pulmonary nodules.  Chest, abdomen, pelvisCTon 01/28/2019 revealed slight interval enlargement of a right lower lobe mass with central necrosis measuring 4.6 x 3.4 cm, previously 4.0 x 3.0 cm. There was no change in right upper lobe nodules(1.4 and 0.8 cm).Therewas almost no residua of left lung nodules, with irregular opacities in the apical left upper lobeandsuperior segment left lower lobe. There was no change in right hilar soft tissue and lymph nodes.There wasvertebra plana deformity of T4andno evidence of new osseous metastatic disease. No evidence of distant metastatic disease in the abdomen or pelvis.The1.6 x 1.1 cmleft adrenal nodule(non-metabolic on prior PET scan),was unchanged.  Symptomatically, he is feeling better since his hospitalization.  Exam reveals mild fatigue and skin irritation around his port-a-cath from tape.  Counts are normal.  Plan: 1.  Labs today:CBC with diff, BMP, Mg. 2.Metastatic high-grade adenocarcinoma the RIGHT lung Clinically, he has struggled since his first cycle of chemotherapy (as compared to immunotherapy). He has received 11 cycles of pembrolizumab.             He is day 9 of cycle #1 pembrolizumab, Alimta, and carboplatin.    He has had increased toxicity.     He received T01, takes folic acid, and took Decadron around the time of his chemotherapy.   Nausea, vomiting, and  diarrhea appear to be subsiding.   Abdominal CT scan revealed colitis.  Discuss thoughts about further chemotherapy.   Consider next cycle with pembrolizumab and Alimta alone.   Consider adding carboplatin as tolerated.   Discuss management of nausea and follow-up in clinic after treatments for IVF and +/- anti-emetics.  Discuss symptom management.  He has antiemetics and pain medications at home to use on a prn bases.  Interventions are adequate.    3.T4 osseous metastasis Discuss last cervical and thoracic spine MRI at [facility].  T4 lesion appears stable.  Encourage patient to contact [facility] regarding planned follow-up..  Continue monthly Xegva (last 02/07/2019). Continue calcium 1200 mg and vitamin D 800 IU daily. 4.Cancer-related pain Pain appears fairly well controlled,  Continue Fentanyl 75 mcg/hr patch and oxycodone 10 mg po q 4 hours prn pain. 5.   Colitis  Encourage patient to pick up Levaquin prescribed during hospitalization. 6.   Tape irritation  No signs of infection.  Patient to contact clinic if any concerns. 7.   RTC on 03/01/2019 for MD assess, labs (CBC with diff, CMP, Mg), and pembrolizumab, Alimta, and +/- carboplatin.  I discussed the assessment and treatment plan with the patient.  The patient was provided an opportunity to ask questions and all were answered.  The patient agreed with the plan and demonstrated an understanding of the instructions.  The patient was advised to call back if the symptoms worsen or if the condition fails to improve as anticipated.  I provided 25 minutes of face-to-face time during this this encounter and > 50% was spent counseling as documented under my assessment and plan.    Lequita Asal, MD, PhD    02/16/2019, 11:22 AM  I, Molly Dorshimer, am acting as Education administrator for [employer].  Mike Gip, MD, PhD.  I, Melissa C. Mike Gip, MD, have reviewed the above documentation for accuracy and completeness, and I agree with the above.

## 2019-02-16 ENCOUNTER — Inpatient Hospital Stay (HOSPITAL_BASED_OUTPATIENT_CLINIC_OR_DEPARTMENT_OTHER): Payer: Medicare Other | Admitting: Hematology and Oncology

## 2019-02-16 ENCOUNTER — Ambulatory Visit: Payer: Medicare Other | Admitting: Hematology and Oncology

## 2019-02-16 ENCOUNTER — Telehealth: Payer: Self-pay

## 2019-02-16 ENCOUNTER — Inpatient Hospital Stay: Payer: Medicare Other

## 2019-02-16 ENCOUNTER — Other Ambulatory Visit: Payer: Medicare Other | Admitting: Hematology and Oncology

## 2019-02-16 ENCOUNTER — Encounter: Payer: Self-pay | Admitting: Hematology and Oncology

## 2019-02-16 VITALS — BP 111/67 | HR 87 | Temp 96.7°F | Resp 18 | Ht 66.0 in | Wt 145.2 lb

## 2019-02-16 DIAGNOSIS — R634 Abnormal weight loss: Secondary | ICD-10-CM

## 2019-02-16 DIAGNOSIS — Z5111 Encounter for antineoplastic chemotherapy: Secondary | ICD-10-CM | POA: Diagnosis not present

## 2019-02-16 DIAGNOSIS — Z87891 Personal history of nicotine dependence: Secondary | ICD-10-CM

## 2019-02-16 DIAGNOSIS — C3431 Malignant neoplasm of lower lobe, right bronchus or lung: Secondary | ICD-10-CM

## 2019-02-16 DIAGNOSIS — C7951 Secondary malignant neoplasm of bone: Secondary | ICD-10-CM | POA: Diagnosis not present

## 2019-02-16 DIAGNOSIS — Z791 Long term (current) use of non-steroidal anti-inflammatories (NSAID): Secondary | ICD-10-CM | POA: Diagnosis not present

## 2019-02-16 DIAGNOSIS — Z5112 Encounter for antineoplastic immunotherapy: Secondary | ICD-10-CM | POA: Diagnosis not present

## 2019-02-16 DIAGNOSIS — Z79899 Other long term (current) drug therapy: Secondary | ICD-10-CM | POA: Diagnosis not present

## 2019-02-16 DIAGNOSIS — R112 Nausea with vomiting, unspecified: Secondary | ICD-10-CM

## 2019-02-16 DIAGNOSIS — I252 Old myocardial infarction: Secondary | ICD-10-CM

## 2019-02-16 DIAGNOSIS — Z7982 Long term (current) use of aspirin: Secondary | ICD-10-CM

## 2019-02-16 DIAGNOSIS — G893 Neoplasm related pain (acute) (chronic): Secondary | ICD-10-CM | POA: Diagnosis not present

## 2019-02-16 LAB — CBC WITH DIFFERENTIAL/PLATELET
Abs Immature Granulocytes: 0.05 10*3/uL (ref 0.00–0.07)
Basophils Absolute: 0 10*3/uL (ref 0.0–0.1)
Basophils Relative: 0 %
Eosinophils Absolute: 0.4 10*3/uL (ref 0.0–0.5)
Eosinophils Relative: 8 %
HCT: 41 % (ref 39.0–52.0)
Hemoglobin: 14.3 g/dL (ref 13.0–17.0)
Immature Granulocytes: 1 %
Lymphocytes Relative: 22 %
Lymphs Abs: 1.1 10*3/uL (ref 0.7–4.0)
MCH: 30.5 pg (ref 26.0–34.0)
MCHC: 34.9 g/dL (ref 30.0–36.0)
MCV: 87.4 fL (ref 80.0–100.0)
Monocytes Absolute: 0.3 10*3/uL (ref 0.1–1.0)
Monocytes Relative: 6 %
Neutro Abs: 3.2 10*3/uL (ref 1.7–7.7)
Neutrophils Relative %: 63 %
Platelets: 191 10*3/uL (ref 150–400)
RBC: 4.69 MIL/uL (ref 4.22–5.81)
RDW: 12.9 % (ref 11.5–15.5)
WBC: 5.1 10*3/uL (ref 4.0–10.5)
nRBC: 0 % (ref 0.0–0.2)

## 2019-02-16 LAB — CULTURE, BLOOD (ROUTINE X 2)
Culture: NO GROWTH
Culture: NO GROWTH
Special Requests: ADEQUATE

## 2019-02-16 LAB — BASIC METABOLIC PANEL
Anion gap: 11 (ref 5–15)
BUN: 15 mg/dL (ref 8–23)
CO2: 25 mmol/L (ref 22–32)
Calcium: 8.9 mg/dL (ref 8.9–10.3)
Chloride: 100 mmol/L (ref 98–111)
Creatinine, Ser: 0.85 mg/dL (ref 0.61–1.24)
GFR calc Af Amer: >60 mL/min (ref >60)
GFR calc non Af Amer: >60 mL/min (ref >60)
Glucose, Bld: 108 mg/dL — ABNORMAL HIGH (ref 70–99)
Potassium: 3.3 mmol/L — ABNORMAL LOW (ref 3.5–5.1)
Sodium: 136 mmol/L (ref 135–145)

## 2019-02-16 LAB — MAGNESIUM: Magnesium: 2.3 mg/dL (ref 1.7–2.4)

## 2019-02-16 NOTE — Telephone Encounter (Signed)
Left detailed message, with patient's permission, informing patient if he develops diarrhea he will need to be placed on oral potassium supplements. Also stressed the importance of increasing potassium in diet due to possible potassium wasting secondary to carboplatin. Informed patient if he changes his mind, we can call in the oral potassium. Number provided should he have any questions.

## 2019-02-16 NOTE — Telephone Encounter (Signed)
-----   Message from Lequita Asal, MD sent at 02/16/2019  1:35 PM EDT ----- Regarding: RE: Please call patient  If he has diarrhea, he will need potassium pills.  It is important that he does eat a lot as he likely is wasting potassium secondary to carboplatin.  Maybe we can send in some potassium pills.  M ----- Message ----- From: Arlan Organ, RN Sent: 02/16/2019   1:08 PM EDT To: Lequita Asal, MD Subject: RE: Please call patient                        Patient opted to increase potassium rich foods in diet. Educated patient in detail on foods.  ----- Message ----- From: Lequita Asal, MD Sent: 02/16/2019  12:51 PM EDT To: Arlan Organ, RN Subject: Please call patient                             Potassium a little low.  Can call in some oral potassium (10 meq x 4 days; dis: #20) Or try to eat potassium rich foods (one 10 inch banana is 10 meq potassium).  M ----- Message ----- From: Buel Ream, Lab In [place] Sent: 02/16/2019  11:59 AM EDT To: Lequita Asal, MD

## 2019-02-16 NOTE — Telephone Encounter (Signed)
-----   Message from Lequita Asal, MD sent at 02/16/2019 12:51 PM EDT ----- Regarding: Please call patient  Potassium a little low.  Can call in some oral potassium (10 meq x 4 days; dis: #20) Or try to eat potassium rich foods (one 10 inch banana is 10 meq potassium).  M ----- Message ----- From: Buel Ream, Lab In [place] Sent: 02/16/2019  11:59 AM EDT To: Lequita Asal, MD

## 2019-02-16 NOTE — Telephone Encounter (Signed)
Contacted patient and made aware of low potassium level. Patient given option to either take oral potassium or increase potassium rich foods in diet. Patient opted to increase potassium rich foods. Educated patient on different options such as (bananas, potatoes, beans, leafy green veggies, etc.) Patient verbalizes understanding and denies any further questions or concerns.

## 2019-02-16 NOTE — Progress Notes (Signed)
Patient was admitted last week for N&V but has not had eposides the week. The patient does report mid back pain ( pain level today 7) come and goes with movement.

## 2019-02-17 ENCOUNTER — Other Ambulatory Visit: Payer: Medicare Other

## 2019-02-17 ENCOUNTER — Ambulatory Visit: Payer: Medicare Other | Admitting: Hematology and Oncology

## 2019-02-18 ENCOUNTER — Telehealth: Payer: Self-pay

## 2019-02-18 NOTE — Telephone Encounter (Signed)
RN called patient's phone but did not get answer.  RN left message requesting call back to schedule palliative care visit.

## 2019-02-21 ENCOUNTER — Telehealth: Payer: Self-pay

## 2019-02-21 NOTE — Telephone Encounter (Signed)
Nutrition  Called patient for nutrition follow-up.  No answer.  Left message with call back number.  Jimmy West B. Zenia Resides, [place], [place] Registered Dietitian [phone] (pager)

## 2019-02-22 ENCOUNTER — Telehealth: Payer: Self-pay

## 2019-02-22 NOTE — Telephone Encounter (Signed)
Telephone call to patient to schedule palliative care visit.  Patient requests Telephonic visit on 02/23/19 at 2:00 PM.  Patient states due to CoV19 he would prefer telephonic visit.

## 2019-02-23 ENCOUNTER — Other Ambulatory Visit: Payer: Self-pay

## 2019-02-23 ENCOUNTER — Other Ambulatory Visit: Payer: Medicare Other

## 2019-02-23 DIAGNOSIS — Z515 Encounter for palliative care: Secondary | ICD-10-CM

## 2019-02-23 NOTE — Progress Notes (Signed)
PATIENT NAME: Jimmy West DOB: 04/29/1951 MRN: [id number]  PRIMARY CARE PROVIDER: [name], NP  RESPONSIBLE PARTY:  Acct ID - Guarantor Home Phone Work Phone Relationship Acct Type  [id number] ELWYN, [phone]  Self P/F     [address], [place], [place] [address]    PLAN OF CARE and INTERVENTIONS:               1.  GOALS OF CARE/ ADVANCE CARE PLANNING:  Paiet wishes to remain independent and continue receiving chemo.               2.  PATIENT/CAREGIVER EDUCATION:  Education on s/s of infection, education on meds for nausea and vomiting and need to take prior to receiving chemo.               3.  DISEASE STATUS: Patient is a 68 year old patient wo was receiving immunotherapy.  Patient received 1 round of chemo treatments due to tumor enlargement.  Patient had severe nausea and vomiting and was hospitalized after chemo.  Patient to receive chemo again on 02-28-19.  Patient reports MD stated he will only receive 1 chemo drug rather that 2 drugs on the 10th.  Patient requested telephonic visit due to [place]. Due to the COVID-19 crisis, this visit was done via telephonic from my office and it was initiated and consent by this patient and or family. Patient reports he is at home today.  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  Patient reports he continues to have pain in his upper back.  Patient currently rates pain at a 3 on pain scale.  Patient on Fentanyl patch 75 mcg's every 72 hours and Oxycodone 20 mg as needed. Patient remains hopeful that he can continue receiving chemo.  Patient's current weight is 145 pounds.  Patient denies having any cough or shortness of breath.  Patient reports he doesn't have much of a appetite but has never been "a big eater."  Patient denies having any edema in his lower extremities. Patient reports he has been sleeping well at night.  Patient denies suffering any falls.  Patient denies having any needs or  concerns at the present time.  Patient remains in agreement with palliative care services.  Patient encouraged to call with questions or concerns.                   HISTORY OF PRESENT ILLNESS:    CODE STATUS: Full Code  ADVANCED DIRECTIVES: No MOST FORM: No PPS: 60%   PHYSICAL EXAM:   VITALS: Telehealth visit, no vital signs taken  LUNGS: patient denies cough or shortness of breath CARDIAC:  EXTREMITIES: patient denies having any edema SKIN: patient denies having any open areas os skin breakdown   NEURO: Alert and oriented x 4       Nilda Simmer, RN

## 2019-02-25 NOTE — Progress Notes (Signed)
[facility]  [address] [place], [place] [address] Phone: [phone]  Fax: [phone]   Clinic Day:  02/28/2019  Referring physician: [name], NP  Chief Complaint: Jimmy West is a 68 y.o. male with metastatic adenocarcinoma of the lung who is seen for assessment prior to cycle #2 carboplatin, Alimta and pembrolizumab.   HPI: The patient was last seen in the medical oncology clinic on 02/16/2019. At that time, he was feeling better since his hospitalization. Exam revealed mild fatigue and skin irritation around his port-a-cath from tape.  Counts were normal. Potassium was 3.3. Patient was contacted and opted to increase intake of potassium rich foods and call back if he experienced diarrhea.   During the interim, he is doing "okay". He reports intermittent nausea and mild weakness and fatigue. He denies vomiting. He has had intermittent, slight abdominal cramps. He often feels hot or cold. He denies any fevers or chills. He reports normal bowel movements. He denies any blood in his stools or black stools.   He continues to have back pain and numbness. He is currently taking 3 oxycodone daily. He has not reached out to his pain management doctors at [facility].   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   He has not been motivated to do much lately. He reports he did not pick up and take his prescribed antibiotic. He continues on folic acid. He took his pre-medications today.    Past Medical History:  Diagnosis Date  . Bulging lumbar disc   . Cancer ([place])    stage 4 lung cancer  . Heart attack [facility])     Past Surgical History:  Procedure Laterality Date  . CARDIAC CATHETERIZATION     two stents  . KNEE SURGERY Left   . PORTA CATH INSERTION N/A 05/21/2018   Procedure: PORTA CATH INSERTION;  Surgeon: Algernon Huxley, MD;  Location: [facility];   Service: Cardiovascular;  Laterality: N/A;    Family History  Problem Relation Age of Onset  . Heart failure Father   . Cancer Maternal Aunt   . Heart failure Maternal Uncle   . Dementia Paternal Grandmother   . Cancer Maternal Aunt     Social History:  reports that he quit smoking about 15 years ago. His smoking use included cigarettes. He has a 22.50 pack-year smoking history. He has never used smokeless tobacco. He reports current alcohol use of about 15.0 standard drinks of alcohol per week. He reports that he does not use drugs. He started smoking at age 72. He is smoking 1 1/2 packs/day. Patient is employed as as full time hair stylist.He plays golf occasionally.Heisworking4-5hours a day and taking several precautions. Patient denies known exposures to radiation ortoxins.The patient is alone today.  Allergies: No Known Allergies  Current Medications: Current Outpatient Medications  Medication Sig Dispense Refill  . acetaminophen (TYLENOL) 500 MG tablet Take 500 mg by mouth 3 (three) times daily as needed.    Marland Kitchen aspirin EC 81 MG tablet Take 81 mg by mouth daily.    . Calcium 600-400 MG-UNIT CHEW Chew 2 tablets by mouth daily.    Marland Kitchen dexamethasone (DECADRON) 4 MG tablet Take 1 tab two times a day the day before Alimta chemo, then take 2 tabs once a day for 3 days starting the day after chemo. 30 tablet 1  . fentaNYL (DURAGESIC) 75  MCG/HR Place 1 patch onto the skin every 3 (three) days. 10 patch 0  . FLUoxetine (PROZAC) 20 MG capsule Take 1 capsule (20 mg total) by mouth daily. 30 capsule 3  . folic acid (FOLVITE) 1 MG tablet Take 1 tablet (1 mg total) by mouth daily. Start 5-7 days before Alimta chemotherapy. Continue until 21 days after Alimta completed. 100 tablet 3  . ibuprofen (ADVIL,MOTRIN) 200 MG tablet Take 200 mg by mouth every 6 (six) hours as needed.    . ondansetron (ZOFRAN ODT) 4 MG disintegrating tablet Take 1 tablet (4 mg total) by mouth every 6 (six) hours as  needed for nausea or vomiting. 30 tablet 1  . Oxycodone HCl 20 MG TABS Take 0.5-1 tablets (10-20 mg total) by mouth every 4 (four) hours as needed (for severe pain). 90 tablet 0  . pembrolizumab (KEYTRUDA) 100 MG/4ML SOLN Inject 2 mg/kg into the vein every 30 (thirty) days.     . polyethylene glycol (MIRALAX / GLYCOLAX) packet Take 17 g by mouth daily.    Marland Kitchen senna (SENOKOT) 8.6 MG tablet Take 1 tablet by mouth as needed.     . naloxone (NARCAN) nasal spray 4 mg/0.1 mL 1 spray by nose in the event of opioid overdose. Call EMS (911) immediately if medication is used. (Patient not taking: Reported on 02/16/2019) 1 kit 0   No current facility-administered medications for this visit.    Facility-Administered Medications Ordered in Other Visits  Medication Dose Route Frequency Provider Last Rate Last Dose  . heparin lock flush 100 unit/mL  500 Units Intravenous Once ,  C, MD      . sodium chloride flush (NS) 0.9 % injection 10 mL  10 mL Intravenous PRN Lequita Asal, MD   10 mL at 02/28/19 6213    Review of Systems  Constitutional: Positive for malaise/fatigue (mild). Negative for chills, diaphoresis, fever and weight loss (stable).       Feeling "ok".  HENT: Negative.  Negative for congestion, ear pain, hearing loss, nosebleeds, sinus pain and sore throat.   Eyes: Negative.  Negative for blurred vision, double vision, photophobia and pain.  Respiratory: Negative.  Negative for cough, hemoptysis, sputum production, shortness of breath and wheezing.   Cardiovascular: Negative.  Negative for chest pain, palpitations, orthopnea, leg swelling and PND.  Gastrointestinal: Positive for abdominal pain (mild cramps) and nausea (intermittent). Negative for blood in stool, constipation, diarrhea, heartburn, melena and vomiting.       Decreased appetite  Genitourinary: Negative for dysuria, flank pain, frequency, hematuria and urgency.  Musculoskeletal: Positive for back pain (well  controlled). Negative for falls, joint pain, myalgias and neck pain.  Skin: Negative.  Negative for itching and rash.  Neurological: Positive for tingling (in mid back, occasional), sensory change (intermittent numbness in mid back; chronic numbness to LEFT heel 2/2 disc issues) and weakness (generalized). Negative for dizziness, tremors, speech change, focal weakness and headaches.  Endo/Heme/Allergies: Negative.  Does not bruise/bleed easily.  Psychiatric/Behavioral: Negative.  Negative for depression, memory loss and substance abuse. The patient is not nervous/anxious and does not have insomnia.        Lack of motivation  All other systems reviewed and are negative.  Performance status (ECOG): 1-2  Vitals Blood pressure 136/87, pulse 76, temperature 98 F (36.7 C), temperature source Oral, resp. rate 16, weight 145 lb 15.1 oz (66.2 kg), SpO2 99 %.   Physical Exam  Constitutional: He is oriented to person, place, and time. He appears  well-developed. No distress.  HENT:  Head: Normocephalic and atraumatic.  Mouth/Throat: Oropharynx is clear and moist. He has dentures. No oropharyngeal exudate.  Brown hair with slight graying.  Mustache.  Mask.    Eyes: Pupils are equal, round, and reactive to light. Conjunctivae and EOM are normal. No scleral icterus.  Glasses.  Gray blue eyes.  Neck: Normal range of motion. Neck supple. No JVD present.  Cardiovascular: Normal rate, regular rhythm and normal heart sounds. Exam reveals no gallop and no friction rub.  No murmur heard. Pulmonary/Chest: Effort normal and breath sounds normal. No respiratory distress. He has no wheezes. He has no rales.  Abdominal: Soft. Bowel sounds are normal. He exhibits no distension and no mass. There is no abdominal tenderness. There is no rebound and no guarding.  Musculoskeletal: Normal range of motion.        General: Tenderness (slight at T4) present. No edema.  Lymphadenopathy:    He has no cervical adenopathy.     He has no axillary adenopathy.       Right: No supraclavicular adenopathy present.       Left: No supraclavicular adenopathy present.  Neurological: He is alert and oriented to person, place, and time.  Skin: Skin is warm and dry. No rash noted. He is not diaphoretic. No erythema.  Psychiatric: He has a normal mood and affect. His behavior is normal. Judgment and thought content normal.  Nursing note and vitals reviewed.   Infusion on 02/28/2019  Component Date Value Ref Range Status  . WBC 02/28/2019 6.2  4.0 - 10.5 K/uL Final  . RBC 02/28/2019 4.10* 4.22 - 5.81 MIL/uL Final  . Hemoglobin 02/28/2019 12.4* 13.0 - 17.0 g/dL Final  . HCT 02/28/2019 35.6* 39.0 - 52.0 % Final  . MCV 02/28/2019 86.8  80.0 - 100.0 fL Final  . MCH 02/28/2019 30.2  26.0 - 34.0 pg Final  . MCHC 02/28/2019 34.8  30.0 - 36.0 g/dL Final  . RDW 02/28/2019 13.9  11.5 - 15.5 % Final  . Platelets 02/28/2019 389  150 - 400 K/uL Final  . nRBC 02/28/2019 0.0  0.0 - 0.2 % Final  . Neutrophils Relative % 02/28/2019 63  % Final  . Neutro Abs 02/28/2019 4.0  1.7 - 7.7 K/uL Final  . Lymphocytes Relative 02/28/2019 15  % Final  . Lymphs Abs 02/28/2019 1.0  0.7 - 4.0 K/uL Final  . Monocytes Relative 02/28/2019 20  % Final  . Monocytes Absolute 02/28/2019 1.2* 0.1 - 1.0 K/uL Final  . Eosinophils Relative 02/28/2019 1  % Final  . Eosinophils Absolute 02/28/2019 0.0  0.0 - 0.5 K/uL Final  . Basophils Relative 02/28/2019 0  % Final  . Basophils Absolute 02/28/2019 0.0  0.0 - 0.1 K/uL Final  . Immature Granulocytes 02/28/2019 1  % Final  . Abs Immature Granulocytes 02/28/2019 0.05  0.00 - 0.07 K/uL Final   Performed at [facility], [address] [place], [place] [address]  . Magnesium 02/28/2019 2.1  1.7 - 2.4 mg/dL Final   Performed at [facility], [address], [place], [place] [address]  . Sodium 02/28/2019 136  135 - 145 mmol/L Final  . Potassium 02/28/2019 3.8  3.5 - 5.1 mmol/L Final  .  Chloride 02/28/2019 103  98 - 111 mmol/L Final  . CO2 02/28/2019 22  22 - 32 mmol/L Final  . Glucose, Bld 02/28/2019 140* 70 - 99 mg/dL Final  . BUN 02/28/2019 13  8 -  23 mg/dL Final  . Creatinine, Ser 02/28/2019 1.00  0.61 - 1.24 mg/dL Final  . Calcium 02/28/2019 9.6  8.9 - 10.3 mg/dL Final  . Total Protein 02/28/2019 7.6  6.5 - 8.1 g/dL Final  . Albumin 02/28/2019 4.1  3.5 - 5.0 g/dL Final  . AST 02/28/2019 31  15 - 41 U/L Final  . ALT 02/28/2019 33  0 - 44 U/L Final  . Alkaline Phosphatase 02/28/2019 76  38 - 126 U/L Final  . Total Bilirubin 02/28/2019 0.6  0.3 - 1.2 mg/dL Final  . GFR calc non Af Amer 02/28/2019 >60  >60 mL/min Final  . GFR calc Af Amer 02/28/2019 >60  >60 mL/min Final  . Anion gap 02/28/2019 11  5 - 15 Final   Performed at [facility], [address], [place], [place] [address]    Assessment:  Jimmy West is a 68 y.o. male with metastatichigh-grade adenocarcinomaof the right lungs/p CT-guided biopsy of a RLL lung mass on 05/11/2018. Pathologyrevealed an invasive high-grade adenocarcinoma, with predominantly solid growth pattern. The neoplastic cells were TTF-1 (+), Napsin A (+), and P40 (+). He has a T4 vertebral metastasis. Clinical stage is T4N1M1.  There was not enough material for [facility] One testing. PDL-1revealed TPS 90%.  PET scanon 04/30/2018 revealed a 3.4 cm hypermetabolic RLL pulmonary mass (SUV 13.2), 11 mm pulmonary nodule in the LEFT lung (SUV 4.5), RIGHT paratracheal lymph node (SUV 5.1), and hypermetabolic activity within the T4 vertebral body (SUV 10.2).   Thoracic spine MRIon 05/03/2018 revealed T4 metastasiswith a 40% pathologic compression deformity and 5 mm of retropulsion of the vertebral body. Retropulsion results in mild spinal canal stenosis and mild bilateral C4-5 foraminal stenosis. There was paravertebral soft tissue thickening from mid T3 to mid T5, likely representing edema related to the  pathologic compression deformity vs. possible extraosseous extension of the neoplasm. There were no additional thoracic spinal metastases noted.   Head MRIon 05/03/2018 revealed no intracranial metastatic disease. 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 Incidental mention made of mild paranasal sinus disease.  Hereceived 11cycles ofpembrolizumab(05/24/2018 - 01/10/2019). He toleratedtreatment well. CEAwas 1.3 on 08/30/2018.LDHwas 165 on 11/29/2018.  He completed T4 radiationon 07/07/2018. He receives Xgevamonthly (06/03/2018 - 01/10/2019).  He is currently day 21 s/p cycle #1 carboplatin, Alimta and pembrolizumab on 02/08/2019.  Cycle #1 was complicated by nausea, vomiting, and diarrhea necessitating hospitalization.  He hascancer-related painin T4. He is currently taking Fentanyl75 mcg/hr and oxycodone10 mg every 4 hours prn.  Cervical and thoracic spine CT [facility]/10/2019 revealed unchanged severe compression deformity/vertebral plana of T4 with a stable degree of retropulsion and focal mild spinal canal stenosis. There was interval enlargement of a right lower lobe pulmonary mass(4.5 x 3.5 cm compared to 3.9 x 2.9 cm)with redemonstrated spiculated pulmonary nodules.  Chest, abdomen, pelvisCTon 01/28/2019 revealed slight interval enlargement of a right lower lobe mass with central necrosis measuring 4.6 x 3.4 cm, previously 4.0 x 3.0 cm. There was no change in right upper lobe nodules(1.4 and 0.8 cm).Therewas almost no residua of left lung nodules, with irregular opacities in the apical left upper lobeandsuperior segment left lower lobe. There was no change in right hilar soft tissue and lymph nodes.There wasvertebra plana deformity of T4andno evidence of new osseous metastatic  disease. No evidence of distant metastatic disease in the  abdomen or pelvis.The1.6 x 1.1 cmleft adrenal nodule(non-metabolic on prior PET scan),was unchanged.  Symptomatically, he is near baseline, but remains fatigued.  Weight is stable.  Exam is unremarkable.  Plan: 1.   Labs today:CBC with diff, CMP, Mg, TSH, free T4. 2.Metastatic high-grade adenocarcinoma the RIGHT lung He has received 11cycles of pembrolizumab. He is s/p cycle #1 pembrolizumab, Alimta, and carboplatin.                         Chemotherapy was complicated by nausea, vomiting, and dehydration.   Symptoms resolved after the first week.             Discuss prior conversation about trying chemotherapy with Alimta and pembrolizumab alone.   He has tolerated pembrolizumab well without side effects.   Patient has taken his premedications.  He wishes to proceed with treatment.   Discuss being proactive and avoiding dehydration this week.   Patient agreeable to returning to clinic daily this week for +/- IVF and anti-emetics. 3.   T4 osseous metastasis Review last thoracic spine [facility] at [facility]. Patient has not called [facility] for follow-up. Encourage patient to follow-up with [facility]. Continue monthly Xgeva (last 02/07/2019). Continue calcium and vitamin D. 4.   Cancer-related pain Pain is well controlled.       Continue Fentanyl 75 mcg/hr patch and oxycodone 10 mg po q 4 hours prn pain. 5.   Pembrolizumab and Alimta today. 6.   RTC every day this week for possible IVF and anti-emetics. 7.   RTC on 03/04/2019 for MD assessment, labs (BMP, Mg), and +/- IVF and anti-emetics.  I discussed the assessment and treatment plan with the patient.  The patient was provided an opportunity to ask questions and all were answered.  The patient agreed with the plan and demonstrated an understanding of the instructions.  The patient was advised to call back if the symptoms worsen or if the condition fails to improve as  anticipated.  I provided 20 minutes of face-to-face time during this this encounter and > 50% was spent counseling as documented under my assessment and plan.    Lequita Asal, MD, PhD    02/28/2019, 10:04 AM  I, Molly Dorshimer, am acting as Education administrator for [employer]. Mike Gip, MD, PhD.  I,  C. Mike Gip, MD, have reviewed the above documentation for accuracy and completeness, and I agree with the above.

## 2019-02-28 ENCOUNTER — Inpatient Hospital Stay: Payer: Medicare Other | Attending: Hematology and Oncology | Admitting: Hematology and Oncology

## 2019-02-28 ENCOUNTER — Encounter: Payer: Self-pay | Admitting: Hematology and Oncology

## 2019-02-28 ENCOUNTER — Inpatient Hospital Stay: Payer: Medicare Other

## 2019-02-28 ENCOUNTER — Other Ambulatory Visit: Payer: Self-pay

## 2019-02-28 VITALS — BP 136/87 | HR 76 | Temp 98.0°F | Resp 16 | Wt 145.9 lb

## 2019-02-28 VITALS — BP 117/64 | HR 82 | Resp 16

## 2019-02-28 DIAGNOSIS — Z5111 Encounter for antineoplastic chemotherapy: Secondary | ICD-10-CM

## 2019-02-28 DIAGNOSIS — Z5112 Encounter for antineoplastic immunotherapy: Secondary | ICD-10-CM | POA: Insufficient documentation

## 2019-02-28 DIAGNOSIS — G893 Neoplasm related pain (acute) (chronic): Secondary | ICD-10-CM | POA: Insufficient documentation

## 2019-02-28 DIAGNOSIS — C3431 Malignant neoplasm of lower lobe, right bronchus or lung: Secondary | ICD-10-CM | POA: Insufficient documentation

## 2019-02-28 DIAGNOSIS — Z79899 Other long term (current) drug therapy: Secondary | ICD-10-CM | POA: Diagnosis not present

## 2019-02-28 DIAGNOSIS — C7951 Secondary malignant neoplasm of bone: Secondary | ICD-10-CM | POA: Diagnosis not present

## 2019-02-28 LAB — CBC WITH DIFFERENTIAL/PLATELET
Abs Immature Granulocytes: 0.05 10*3/uL (ref 0.00–0.07)
Basophils Absolute: 0 10*3/uL (ref 0.0–0.1)
Basophils Relative: 0 %
Eosinophils Absolute: 0 10*3/uL (ref 0.0–0.5)
Eosinophils Relative: 1 %
HCT: 35.6 % — ABNORMAL LOW (ref 39.0–52.0)
Hemoglobin: 12.4 g/dL — ABNORMAL LOW (ref 13.0–17.0)
Immature Granulocytes: 1 %
Lymphocytes Relative: 15 %
Lymphs Abs: 1 10*3/uL (ref 0.7–4.0)
MCH: 30.2 pg (ref 26.0–34.0)
MCHC: 34.8 g/dL (ref 30.0–36.0)
MCV: 86.8 fL (ref 80.0–100.0)
Monocytes Absolute: 1.2 10*3/uL — ABNORMAL HIGH (ref 0.1–1.0)
Monocytes Relative: 20 %
Neutro Abs: 4 10*3/uL (ref 1.7–7.7)
Neutrophils Relative %: 63 %
Platelets: 389 10*3/uL (ref 150–400)
RBC: 4.1 MIL/uL — ABNORMAL LOW (ref 4.22–5.81)
RDW: 13.9 % (ref 11.5–15.5)
WBC: 6.2 10*3/uL (ref 4.0–10.5)
nRBC: 0 % (ref 0.0–0.2)

## 2019-02-28 LAB — COMPREHENSIVE METABOLIC PANEL
ALT: 33 U/L (ref 0–44)
AST: 31 U/L (ref 15–41)
Albumin: 4.1 g/dL (ref 3.5–5.0)
Alkaline Phosphatase: 76 U/L (ref 38–126)
Anion gap: 11 (ref 5–15)
BUN: 13 mg/dL (ref 8–23)
CO2: 22 mmol/L (ref 22–32)
Calcium: 9.6 mg/dL (ref 8.9–10.3)
Chloride: 103 mmol/L (ref 98–111)
Creatinine, Ser: 1 mg/dL (ref 0.61–1.24)
GFR calc Af Amer: >60 mL/min (ref >60)
GFR calc non Af Amer: >60 mL/min (ref >60)
Glucose, Bld: 140 mg/dL — ABNORMAL HIGH (ref 70–99)
Potassium: 3.8 mmol/L (ref 3.5–5.1)
Sodium: 136 mmol/L (ref 135–145)
Total Bilirubin: 0.6 mg/dL (ref 0.3–1.2)
Total Protein: 7.6 g/dL (ref 6.5–8.1)

## 2019-02-28 LAB — MAGNESIUM: Magnesium: 2.1 mg/dL (ref 1.7–2.4)

## 2019-02-28 LAB — TSH: TSH: 1.149 u[IU]/mL (ref 0.350–4.500)

## 2019-02-28 MED ORDER — DEXAMETHASONE SODIUM PHOSPHATE 10 MG/ML IJ SOLN
10.0000 mg | Freq: Once | INTRAMUSCULAR | Status: AC
  Administered 2019-02-28: 10 mg via INTRAVENOUS
  Filled 2019-02-28: qty 1

## 2019-02-28 MED ORDER — SODIUM CHLORIDE 0.9 % IV SOLN
Freq: Once | INTRAVENOUS | Status: AC
  Administered 2019-02-28: 11:00:00 via INTRAVENOUS
  Filled 2019-02-28: qty 250

## 2019-02-28 MED ORDER — SODIUM CHLORIDE 0.9 % IV SOLN: Freq: Once | INTRAVENOUS | Status: DC

## 2019-02-28 MED ORDER — HEPARIN SOD (PORK) LOCK FLUSH 100 UNIT/ML IV SOLN
500.0000 [IU] | Freq: Once | INTRAVENOUS | Status: AC
  Administered 2019-02-28: 500 [IU] via INTRAVENOUS
  Filled 2019-02-28: qty 5

## 2019-02-28 MED ORDER — SODIUM CHLORIDE 0.9 % IV SOLN
200.0000 mg | Freq: Once | INTRAVENOUS | Status: AC
  Administered 2019-02-28: 200 mg via INTRAVENOUS
  Filled 2019-02-28: qty 8

## 2019-02-28 MED ORDER — SODIUM CHLORIDE 0.9% FLUSH
10.0000 mL | INTRAVENOUS | Status: DC | PRN
  Administered 2019-02-28: 10 mL via INTRAVENOUS
  Filled 2019-02-28: qty 10

## 2019-02-28 MED ORDER — SODIUM CHLORIDE 0.9 % IV SOLN
500.0000 mg/m2 | Freq: Once | INTRAVENOUS | Status: AC
  Administered 2019-02-28: 900 mg via INTRAVENOUS
  Filled 2019-02-28: qty 20

## 2019-02-28 MED ORDER — PALONOSETRON HCL INJECTION 0.25 MG/5ML
0.2500 mg | Freq: Once | INTRAVENOUS | Status: AC
  Administered 2019-02-28: 0.25 mg via INTRAVENOUS
  Filled 2019-02-28: qty 5

## 2019-02-28 NOTE — Patient Instructions (Signed)
Pemetrexed injection What is this medicine? PEMETREXED (PEM e TREX ed) is a chemotherapy drug used to treat lung cancers like non-small cell lung cancer and mesothelioma. It may also be used to treat other cancers. This medicine may be used for other purposes; ask your health care provider or pharmacist if you have questions. COMMON BRAND NAME(S): Alimta What should I tell my health care provider before I take this medicine? They need to know if you have any of these conditions:  infection (especially a virus infection such as chickenpox, cold sores, or herpes)  kidney disease  low blood counts, like low white cell, platelet, or red cell counts  lung or breathing disease, like asthma  radiation therapy  an unusual or allergic reaction to pemetrexed, other medicines, foods, dyes, or preservative  pregnant or trying to get pregnant  breast-feeding How should I use this medicine? This drug is given as an infusion into a vein. It is administered in a hospital or clinic by a specially trained health care professional. Talk to your pediatrician regarding the use of this medicine in children. Special care may be needed. Overdosage: If you think you have taken too much of this medicine contact a poison control center or emergency room at once. NOTE: This medicine is only for you. Do not share this medicine with others. What if I miss a dose? It is important not to miss your dose. Call your doctor or health care professional if you are unable to keep an appointment. What may interact with this medicine? This medicine may interact with the following medications:  Ibuprofen This list may not describe all possible interactions. Give your health care provider a list of all the medicines, herbs, non-prescription drugs, or dietary supplements you use. Also tell them if you smoke, drink alcohol, or use illegal drugs. Some items may interact with your medicine. What should I watch for while using  this medicine? Visit your doctor for checks on your progress. This drug may make you feel generally unwell. This is not uncommon, as chemotherapy can affect healthy cells as well as cancer cells. Report any side effects. Continue your course of treatment even though you feel ill unless your doctor tells you to stop. In some cases, you may be given additional medicines to help with side effects. Follow all directions for their use. Call your doctor or health care professional for advice if you get a fever, chills or sore throat, or other symptoms of a cold or flu. Do not treat yourself. This drug decreases your body's ability to fight infections. Try to avoid being around people who are sick. This medicine may increase your risk to bruise or bleed. Call your doctor or health care professional if you notice any unusual bleeding. Be careful brushing and flossing your teeth or using a toothpick because you may get an infection or bleed more easily. If you have any dental work done, tell your dentist you are receiving this medicine. Avoid taking products that contain aspirin, acetaminophen, ibuprofen, naproxen, or ketoprofen unless instructed by your doctor. These medicines may hide a fever. Call your doctor or health care professional if you get diarrhea or mouth sores. Do not treat yourself. To protect your kidneys, drink water or other fluids as directed while you are taking this medicine. Do not become pregnant while taking this medicine or for 6 months after stopping it. Women should inform their doctor if they wish to become pregnant or think they might be pregnant. Men should  not father a child while taking this medicine and for 3 months after stopping it. This may interfere with the ability to father a child. You should talk to your doctor or health care professional if you are concerned about your fertility. There is a potential for serious side effects to an unborn child. Talk to your health care  professional or pharmacist for more information. Do not breast-feed an infant while taking this medicine or for 1 week after stopping it. What side effects may I notice from receiving this medicine? Side effects that you should report to your doctor or health care professional as soon as possible:  allergic reactions like skin rash, itching or hives, swelling of the face, lips, or tongue  breathing problems  redness, blistering, peeling or loosening of the skin, including inside the mouth  signs and symptoms of bleeding such as bloody or black, tarry stools; red or dark-brown urine; spitting up blood or brown material that looks like coffee grounds; red spots on the skin; unusual bruising or bleeding from the eye, gums, or nose  signs and symptoms of infection like fever or chills; cough; sore throat; pain or trouble passing urine  signs and symptoms of kidney injury like trouble passing urine or change in the amount of urine  signs and symptoms of liver injury like dark yellow or brown urine; general ill feeling or flu-like symptoms; light-colored stools; loss of appetite; nausea; right upper belly pain; unusually weak or tired; yellowing of the eyes or skin Side effects that usually do not require medical attention (report to your doctor or health care professional if they continue or are bothersome):  constipation  mouth sores  nausea, vomiting  unusually weak or tired This list may not describe all possible side effects. Call your doctor for medical advice about side effects. You may report side effects to FDA at 1-800-FDA-1088. Where should I keep my medicine? This drug is given in a hospital or clinic and will not be stored at home. NOTE: This sheet is a summary. It may not cover all possible information. If you have questions about this medicine, talk to your doctor, pharmacist, or health care provider.  2020 Elsevier/Gold Standard (2017-08-26 16:11:33) Pembrolizumab  injection What is this medicine? PEMBROLIZUMAB (pem broe liz ue mab) is a monoclonal antibody. It is used to treat bladder cancer, cervical cancer, endometrial cancer, esophageal cancer, head and neck cancer, hepatocellular cancer, Hodgkin lymphoma, kidney cancer, lymphoma, melanoma, Merkel cell carcinoma, lung cancer, stomach cancer, urothelial cancer, and cancers that have a certain genetic condition. This medicine may be used for other purposes; ask your health care provider or pharmacist if you have questions. COMMON BRAND NAME(S): Keytruda What should I tell my health care provider before I take this medicine? They need to know if you have any of these conditions:  diabetes  immune system problems  inflammatory bowel disease  liver disease  lung or breathing disease  lupus  received or scheduled to receive an organ transplant or a stem-cell transplant that uses donor stem cells  an unusual or allergic reaction to pembrolizumab, other medicines, foods, dyes, or preservatives  pregnant or trying to get pregnant  breast-feeding How should I use this medicine? This medicine is for infusion into a vein. It is given by a health care professional in a hospital or clinic setting. A special MedGuide will be given to you before each treatment. Be sure to read this information carefully each time. Talk to your pediatrician regarding the  use of this medicine in children. While this drug may be prescribed for selected conditions, precautions do apply. Overdosage: If you think you have taken too much of this medicine contact a poison control center or emergency room at once. NOTE: This medicine is only for you. Do not share this medicine with others. What if I miss a dose? It is important not to miss your dose. Call your doctor or health care professional if you are unable to keep an appointment. What may interact with this medicine? Interactions have not been studied. Give your health  care provider a list of all the medicines, herbs, non-prescription drugs, or dietary supplements you use. Also tell them if you smoke, drink alcohol, or use illegal drugs. Some items may interact with your medicine. This list may not describe all possible interactions. Give your health care provider a list of all the medicines, herbs, non-prescription drugs, or dietary supplements you use. Also tell them if you smoke, drink alcohol, or use illegal drugs. Some items may interact with your medicine. What should I watch for while using this medicine? Your condition will be monitored carefully while you are receiving this medicine. You may need blood work done while you are taking this medicine. Do not become pregnant while taking this medicine or for 4 months after stopping it. Women should inform their doctor if they wish to become pregnant or think they might be pregnant. There is a potential for serious side effects to an unborn child. Talk to your health care professional or pharmacist for more information. Do not breast-feed an infant while taking this medicine or for 4 months after the last dose. What side effects may I notice from receiving this medicine? Side effects that you should report to your doctor or health care professional as soon as possible:  allergic reactions like skin rash, itching or hives, swelling of the face, lips, or tongue  bloody or black, tarry  breathing problems  changes in vision  chest pain  chills  confusion  constipation  cough  diarrhea  dizziness or feeling faint or lightheaded  fast or irregular heartbeat  fever  flushing  hair loss  joint pain  low blood counts - this medicine may decrease the number of white blood cells, red blood cells and platelets. You may be at increased risk for infections and bleeding.  muscle pain  muscle weakness  persistent headache  redness, blistering, peeling or loosening of the skin, including inside  the mouth  signs and symptoms of high blood sugar such as dizziness; dry mouth; dry skin; fruity breath; nausea; stomach pain; increased hunger or thirst; increased urination  signs and symptoms of kidney injury like trouble passing urine or change in the amount of urine  signs and symptoms of liver injury like dark urine, light-colored stools, loss of appetite, nausea, right upper belly pain, yellowing of the eyes or skin  sweating  swollen lymph nodes  weight loss Side effects that usually do not require medical attention (report to your doctor or health care professional if they continue or are bothersome):  decreased appetite  muscle pain  tiredness This list may not describe all possible side effects. Call your doctor for medical advice about side effects. You may report side effects to FDA at 1-800-FDA-1088. Where should I keep my medicine? This drug is given in a hospital or clinic and will not be stored at home. NOTE: This sheet is a summary. It may not cover all possible information. If  you have questions about this medicine, talk to your doctor, pharmacist, or health care provider.  2020 Elsevier/Gold Standard (2018-08-03 13:46:58)

## 2019-02-28 NOTE — Progress Notes (Signed)
Pt here for follow up. Reports he is + for nausea, fatigue/weakness, and slight abdominal cramping that comes and goes since he received first treatment.

## 2019-03-01 ENCOUNTER — Inpatient Hospital Stay: Payer: Medicare Other

## 2019-03-01 ENCOUNTER — Inpatient Hospital Stay (HOSPITAL_BASED_OUTPATIENT_CLINIC_OR_DEPARTMENT_OTHER): Payer: Medicare Other | Admitting: Hospice and Palliative Medicine

## 2019-03-01 ENCOUNTER — Other Ambulatory Visit: Payer: Self-pay

## 2019-03-01 DIAGNOSIS — Z515 Encounter for palliative care: Secondary | ICD-10-CM

## 2019-03-01 DIAGNOSIS — C3431 Malignant neoplasm of lower lobe, right bronchus or lung: Secondary | ICD-10-CM

## 2019-03-01 DIAGNOSIS — G893 Neoplasm related pain (acute) (chronic): Secondary | ICD-10-CM

## 2019-03-01 DIAGNOSIS — F329 Major depressive disorder, single episode, unspecified: Secondary | ICD-10-CM

## 2019-03-01 DIAGNOSIS — F32A Depression, unspecified: Secondary | ICD-10-CM

## 2019-03-01 LAB — T4: T4, Total: 8.6 ug/dL (ref 4.5–12.0)

## 2019-03-01 MED ORDER — FENTANYL 75 MCG/HR TD PT72: 1.0000 | MEDICATED_PATCH | TRANSDERMAL | 0 refills | Status: DC

## 2019-03-01 MED ORDER — FLUOXETINE HCL 40 MG PO CAPS: 40.0000 mg | ORAL_CAPSULE | Freq: Every day | ORAL | 3 refills | Status: DC

## 2019-03-01 NOTE — Progress Notes (Signed)
Virtual Visit via Telephone Note  I connected with Jimmy West on 03/01/19 at  2:00 PM EDT by telephone and verified that I am speaking with the correct person using two identifiers.   I discussed the limitations, risks, security and privacy concerns of performing an evaluation and management service by telephone and the availability of in person appointments. I also discussed with the patient that there may be a patient responsible charge related to this service. The patient expressed understanding and agreed to proceed.   History of Present Illness: Palliative care follow-up for this 68 year old male with multiple medical problems including presumed metastatic lung cancer. Initially,patient presented withpulmonary nodules from chest x-ray ordered by PCP. Subsequently referred to oncology for work-up and was evaluated by Dr. Mike Gip on 04/30/2018. PET scan revealed several hypermetabolic pulmonary nodules and activity within the T4 vertebral body. Follow-up MRI of the spine revealed T4 metastasis with pathological compression deformity and mild spinal canal stenosis with probable edema and extraosseous extension of neoplasm from mid T3 to mid T4. He was referred to palliative care clinic to assist with symptom management and to establish treatment goals.    Observations/Objective: Routine follow-up visit today by phone.  Patient was hospitalized recently 02/11/19-02/13/2019 with vomiting and diarrhea secondary to chemotherapy.  His chemotherapy regimen has been adjusted somewhat and patient has felt better following discharge from the hospital.  He denies any distressing symptoms at present.  He reports stable pain on transdermal fentanyl with PRN use of oxycodone.  Patient does continue to endorse depression and was tearful-sounding while talking to me on the phone.  Had previously referred him to speak with LCSW but patient says he never returned the call.  He is in agreement with  speaking with someone for counseling and will re-refer him.  We will also trial increased dose of Prozac to 40 mg daily.  PDMP reviewed  Assessment and Plan: -Increase fluoxetine 40mg  daily -Continue fentanyl 102mcg Q72H (Rx #10) -Continue oxycodone 10-20mg  Q4H prn -Referral to Sandi, LCSW  Follow Up Instructions: RTC in 1 month   I discussed the assessment and treatment plan with the patient. The patient was provided an opportunity to ask questions and all were answered. The patient agreed with the plan and demonstrated an understanding of the instructions.   The patient was advised to call back or seek an in-person evaluation if the symptoms worsen or if the condition fails to improve as anticipated.  I provided 10 minutes of non-face-to-face time during this encounter.   Irean Hong, NP

## 2019-03-01 NOTE — Progress Notes (Signed)
Patient here for a VS check after receiving chemotherapy yesterday.  VSS:  111/61, 79, 97.4, 16.  No pain per patient.  Patient verbalized that he had a restless night but was able to get some sleep.  He drank a boost and ate a banana this morning and is "feeling pretty good".  Patient advised that if his condition changes in any way that he is to call Shirlean Mylar and schedule a time to come in.  Patient verbalized understanding and repeated the instructions back.

## 2019-03-02 ENCOUNTER — Inpatient Hospital Stay: Payer: Medicare Other | Attending: Hematology and Oncology | Admitting: Hematology and Oncology

## 2019-03-02 ENCOUNTER — Inpatient Hospital Stay: Payer: Medicare Other

## 2019-03-02 VITALS — BP 120/77 | HR 90 | Temp 97.5°F | Resp 18 | Ht 66.0 in | Wt 149.0 lb

## 2019-03-02 DIAGNOSIS — I252 Old myocardial infarction: Secondary | ICD-10-CM | POA: Diagnosis not present

## 2019-03-02 DIAGNOSIS — Z7982 Long term (current) use of aspirin: Secondary | ICD-10-CM | POA: Diagnosis not present

## 2019-03-02 DIAGNOSIS — C7951 Secondary malignant neoplasm of bone: Secondary | ICD-10-CM | POA: Diagnosis not present

## 2019-03-02 DIAGNOSIS — Z791 Long term (current) use of non-steroidal anti-inflammatories (NSAID): Secondary | ICD-10-CM | POA: Insufficient documentation

## 2019-03-02 DIAGNOSIS — C3431 Malignant neoplasm of lower lobe, right bronchus or lung: Secondary | ICD-10-CM | POA: Diagnosis not present

## 2019-03-02 DIAGNOSIS — Z87891 Personal history of nicotine dependence: Secondary | ICD-10-CM | POA: Diagnosis not present

## 2019-03-02 DIAGNOSIS — G893 Neoplasm related pain (acute) (chronic): Secondary | ICD-10-CM | POA: Insufficient documentation

## 2019-03-02 DIAGNOSIS — Z79899 Other long term (current) drug therapy: Secondary | ICD-10-CM | POA: Insufficient documentation

## 2019-03-02 DIAGNOSIS — Z5112 Encounter for antineoplastic immunotherapy: Secondary | ICD-10-CM | POA: Diagnosis not present

## 2019-03-02 DIAGNOSIS — R110 Nausea: Secondary | ICD-10-CM

## 2019-03-02 DIAGNOSIS — R5383 Other fatigue: Secondary | ICD-10-CM | POA: Diagnosis not present

## 2019-03-02 DIAGNOSIS — T451X5A Adverse effect of antineoplastic and immunosuppressive drugs, initial encounter: Secondary | ICD-10-CM

## 2019-03-02 DIAGNOSIS — E860 Dehydration: Secondary | ICD-10-CM

## 2019-03-02 MED ORDER — HEPARIN SOD (PORK) LOCK FLUSH 100 UNIT/ML IV SOLN
500.0000 [IU] | Freq: Once | INTRAVENOUS | Status: AC
  Administered 2019-03-02: 500 [IU] via INTRAVENOUS
  Filled 2019-03-02: qty 5

## 2019-03-02 MED ORDER — SODIUM CHLORIDE 0.9 % IV SOLN
Freq: Once | INTRAVENOUS | Status: AC
  Administered 2019-03-02: 11:00:00 via INTRAVENOUS
  Filled 2019-03-02: qty 250

## 2019-03-02 MED ORDER — SODIUM CHLORIDE 0.9% FLUSH
10.0000 mL | INTRAVENOUS | Status: DC | PRN
  Administered 2019-03-02: 10 mL via INTRAVENOUS
  Filled 2019-03-02: qty 10

## 2019-03-02 NOTE — Progress Notes (Signed)
The patient reports there has been a change from yesterday where he was fine and today the patient c/o chills and cold symptoms which he can't get warm, and has been more weaker today.  no fever, no SOB.

## 2019-03-02 NOTE — Patient Instructions (Signed)
Instructed patient to eat frequent small meals/snacks and to increase liquids as tolerated. Patient will return to clinic tomorrow for evaluation.

## 2019-03-02 NOTE — Progress Notes (Signed)
2 PM; patient beginning to feel better. He agreed to eat a couple of peanut butter crackers. Drinking water. Receiving 518ml of IVF's.

## 2019-03-02 NOTE — Progress Notes (Signed)
[facility]  [address] [place], [place] [address] Phone: [phone]  Fax: [phone]   Clinic Day:  03/02/2019  Referring physician: [name], NP  Chief Complaint: Jimmy West is a 68 y.o. male with metastatic adenocarcinoma of the lung who is seen for sick call visit on day #3 ofcycle #2 Alimta and pembrolizumab.  HPI:  The patient was last seen in the medical oncology clinic on 02/28/2019. At that time, he was doing "ok".  He had mild fatigue and intermittent nausea.  Weight was stable. He lacked motivation.  Back pain was stable.  He wished to continue chemotherapy.  He received cycle #2 Alimta and pembrolizumab.  During the interim, he felt good yesterday and hydrated sufficiently. He woke up today and took his medications. He ate a banana and drank an Ensure.  He then started feeling cold. He denies any fevers. He could only sip fluids. He has little to no appetite. He can only eat a small portion at a time. He denies any nausea, vomiting, or diarrhea. He has had difficulty sleeping and restless legs while on Decadron.  He has not had any Zofran today.  He states that he "doesn't feel as bad" as with the first cycle of chemotherapy.   Past Medical History:  Diagnosis Date   Bulging lumbar disc    Cancer ([place])    stage 4 lung cancer   Heart attack [facility])     Past Surgical History:  Procedure Laterality Date   CARDIAC CATHETERIZATION     two stents   KNEE SURGERY Left    PORTA CATH INSERTION N/A 05/21/2018   Procedure: PORTA CATH INSERTION;  Surgeon: Algernon Huxley, MD;  Location: [facility];  Service: Cardiovascular;  Laterality: N/A;    Family History  Problem Relation Age of Onset   Heart failure Father    Cancer Maternal Aunt    Heart failure Maternal Uncle    Dementia Paternal Grandmother    Cancer Maternal Aunt     Social History:  reports that he quit smoking about 15 years ago. His  smoking use included cigarettes. He has a 22.50 pack-year smoking history. He has never used smokeless tobacco. He reports current alcohol use of about 15.0 standard drinks of alcohol per week. He reports that he does not use drugs. He started smoking at age 7. He is smoking 1 1/2 packs/day. Patient denies known exposures to radiation ortoxins. 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 He has 8 cats. The patient is alone today.  Allergies: No Known Allergies  Current Medications: Current Outpatient Medications  Medication Sig Dispense Refill   acetaminophen (TYLENOL) 500 MG tablet Take 500 mg by mouth 3 (three) times daily as needed.     aspirin EC 81 MG tablet Take 81 mg by mouth daily.     Calcium 600-400 MG-UNIT CHEW Chew 2 tablets by mouth daily.     dexamethasone (DECADRON) 4 MG tablet Take 1 tab two times a day the day before Alimta chemo, then take 2 tabs once a day for 3 days starting the day after chemo. 30 tablet 1   fentaNYL (DURAGESIC) 75 MCG/HR Place 1 patch onto the skin every 3 (three) days. 10 patch 0   FLUoxetine (PROZAC) 40 MG capsule Take 1 capsule (40 mg total) by mouth daily. 30 capsule 3   folic acid (FOLVITE) 1 MG tablet Take 1 tablet (  1 mg total) by mouth daily. Start 5-7 days before Alimta chemotherapy. Continue until 21 days after Alimta completed. 100 tablet 3   ibuprofen (ADVIL,MOTRIN) 200 MG tablet Take 200 mg by mouth every 6 (six) hours as needed.     naloxone (NARCAN) nasal spray 4 mg/0.1 mL 1 spray by nose in the event of opioid overdose. Call EMS (911) immediately if medication is used. (Patient not taking: Reported on 02/16/2019) 1 kit 0   ondansetron (ZOFRAN ODT) 4 MG disintegrating tablet Take 1 tablet (4 mg total) by mouth every 6 (six) hours as needed for nausea or vomiting. 30 tablet 1   Oxycodone HCl 20 MG TABS Take 0.5-1 tablets (10-20 mg total) by mouth every 4 (four) hours as needed (for  severe pain). 90 tablet 0   pembrolizumab (KEYTRUDA) 100 MG/4ML SOLN Inject 2 mg/kg into the vein every 30 (thirty) days.      polyethylene glycol (MIRALAX / GLYCOLAX) packet Take 17 g by mouth daily.     senna (SENOKOT) 8.6 MG tablet Take 1 tablet by mouth as needed.      No current facility-administered medications for this visit.    Facility-Administered Medications Ordered in Other Visits  Medication Dose Route Frequency Provider Last Rate Last Dose   heparin lock flush 100 unit/mL  500 Units Intravenous Once Chad Tiznado C, MD       sodium chloride flush (NS) 0.9 % injection 10 mL  10 mL Intravenous PRN Nolon Stalls C, MD   10 mL at 03/02/19 1056    Review of Systems  Constitutional: Positive for chills and malaise/fatigue (mild). Negative for diaphoresis, fever and weight loss.  HENT: Negative.  Negative for congestion, ear discharge, ear pain, hearing loss, sinus pain and sore throat.   Eyes: Negative.  Negative for blurred vision, double vision, photophobia and pain.  Respiratory: Negative.  Negative for cough, shortness of breath and wheezing.   Cardiovascular: Negative.  Negative for chest pain, palpitations, claudication, leg swelling and PND.  Gastrointestinal: Negative.  Negative for abdominal pain, blood in stool, constipation, diarrhea, melena, nausea and vomiting.       No appetite.  Genitourinary: Negative.  Negative for dysuria, frequency, hematuria and urgency.  Musculoskeletal: Negative.  Negative for back pain, joint pain and myalgias.  Skin: Negative.  Negative for rash.  Neurological: Negative for dizziness, tingling, sensory change, weakness and headaches. Tremors: restless legs.  Endo/Heme/Allergies: Negative.  Does not bruise/bleed easily.  Psychiatric/Behavioral: Negative for depression, memory loss and substance abuse. The patient has insomnia (difficulty sleeping on decadron). The patient is not nervous/anxious.        Difficulty sleeping.  All  other systems reviewed and are negative.  Performance status (ECOG): 1-2  Vitals Blood pressure 120/77, pulse 90, temperature (!) 97.5 F (36.4 C), temperature source Tympanic, resp. rate 18, height 5' 6"  (1.676 m), weight 149 lb 0.5 oz (67.6 kg), SpO2 99 %.   Physical Exam  Constitutional: He is oriented to person, place, and time. He appears well-developed and well-nourished. No distress.  HENT:  Head: Normocephalic and atraumatic.  Mouth/Throat: No oropharyngeal exudate.  Short, gray hair.  Mouth dry/tacky.  Wearing a mask.  Eyes: Pupils are equal, round, and reactive to light. Conjunctivae and EOM are normal. No scleral icterus.  Glasses.   Neck: Normal range of motion. Neck supple.  Cardiovascular: Normal rate, regular rhythm and normal heart sounds.  No murmur heard. Pulmonary/Chest: Effort normal and breath sounds normal. No respiratory distress. He has  no wheezes.  Abdominal: Soft. Bowel sounds are normal. He exhibits no distension. There is no abdominal tenderness.  Musculoskeletal: Normal range of motion.        General: No edema.  Lymphadenopathy:    He has no cervical adenopathy.    He has no axillary adenopathy.       Right: No supraclavicular adenopathy present.       Left: No supraclavicular adenopathy present.  Neurological: He is alert and oriented to person, place, and time.  Skin: Skin is warm and dry. He is not diaphoretic. No erythema.  Psychiatric: He has a normal mood and affect. His behavior is normal. Judgment and thought content normal.  Nursing note and vitals reviewed.   Infusion on 02/28/2019  Component Date Value Ref Range Status   WBC 02/28/2019 6.2  4.0 - 10.5 K/uL Final   RBC 02/28/2019 4.10* 4.22 - 5.81 MIL/uL Final   Hemoglobin 02/28/2019 12.4* 13.0 - 17.0 g/dL Final   HCT 02/28/2019 35.6* 39.0 - 52.0 % Final   MCV 02/28/2019 86.8  80.0 - 100.0 fL Final   MCH 02/28/2019 30.2  26.0 - 34.0 pg Final   MCHC 02/28/2019 34.8  30.0 - 36.0  g/dL Final   RDW 02/28/2019 13.9  11.5 - 15.5 % Final   Platelets 02/28/2019 389  150 - 400 K/uL Final   nRBC 02/28/2019 0.0  0.0 - 0.2 % Final   Neutrophils Relative % 02/28/2019 63  % Final   Neutro Abs 02/28/2019 4.0  1.7 - 7.7 K/uL Final   Lymphocytes Relative 02/28/2019 15  % Final   Lymphs Abs 02/28/2019 1.0  0.7 - 4.0 K/uL Final   Monocytes Relative 02/28/2019 20  % Final   Monocytes Absolute 02/28/2019 1.2* 0.1 - 1.0 K/uL Final   Eosinophils Relative 02/28/2019 1  % Final   Eosinophils Absolute 02/28/2019 0.0  0.0 - 0.5 K/uL Final   Basophils Relative 02/28/2019 0  % Final   Basophils Absolute 02/28/2019 0.0  0.0 - 0.1 K/uL Final   Immature Granulocytes 02/28/2019 1  % Final   Abs Immature Granulocytes 02/28/2019 0.05  0.00 - 0.07 K/uL Final   Performed at [facility], [address] [place], [place] [address]   Magnesium 02/28/2019 2.1  1.7 - 2.4 mg/dL Final   Performed at [facility], [address] [place], [place] [address]   Sodium 02/28/2019 136  135 - 145 mmol/L Final   Potassium 02/28/2019 3.8  3.5 - 5.1 mmol/L Final   Chloride 02/28/2019 103  98 - 111 mmol/L Final   CO2 02/28/2019 22  22 - 32 mmol/L Final   Glucose, Bld 02/28/2019 140* 70 - 99 mg/dL Final   BUN 02/28/2019 13  8 - 23 mg/dL Final   Creatinine, Ser 02/28/2019 1.00  0.61 - 1.24 mg/dL Final   Calcium 02/28/2019 9.6  8.9 - 10.3 mg/dL Final   Total Protein 02/28/2019 7.6  6.5 - 8.1 g/dL Final   Albumin 02/28/2019 4.1  3.5 - 5.0 g/dL Final   AST 02/28/2019 31  15 - 41 U/L Final   ALT 02/28/2019 33  0 - 44 U/L Final   Alkaline Phosphatase 02/28/2019 76  38 - 126 U/L Final   Total Bilirubin 02/28/2019 0.6  0.3 - 1.2 mg/dL Final   GFR calc non Af Amer 02/28/2019 >60  >60 mL/min Final   GFR calc Af Amer 02/28/2019 >60  >60 mL/min Final   Anion gap 02/28/2019 11  5 - 15 Final   Performed at [facility], [address]  [place], [place] [address]   TSH 02/28/2019 1.149  0.350 - 4.500 uIU/mL Final   Comment: Performed by a 3rd Generation assay with a functional sensitivity of <=0.01 uIU/mL. Performed at [facility], [place],  [address]    T4, Total 02/28/2019 8.6  4.5 - 12.0 ug/dL Final   Comment: (NOTE) Performed At: [facility] [place], [place] [id number] Rush Farmer MD QQ:[id number]     Assessment:  Jimmy West is a 68 y.o. male with metastatichigh-grade adenocarcinomaof the right lungs/p CT-guided biopsy of a RLL lung mass on 05/11/2018. Pathologyrevealed an invasive high-grade adenocarcinoma, with predominantly solid growth pattern. The neoplastic cells were TTF-1 (+), Napsin A (+), and P40 (+). He has a T4 vertebral metastasis. Clinical stage is T4N1M1.  There was not enough material for [facility] One testing. PDL-1revealed TPS 90%.  PET scanon 04/30/2018 revealed a 3.4 cm hypermetabolic RLL pulmonary mass (SUV 13.2), 11 mm pulmonary nodule in the LEFT lung (SUV 4.5), RIGHT paratracheal lymph node (SUV 5.1), and hypermetabolic activity within the T4 vertebral body (SUV 10.2).   Thoracic spine MRIon 05/03/2018 revealed T4 metastasiswith a 40% pathologic compression deformity and 5 mm of retropulsion of the vertebral body. Retropulsion results in mild spinal canal stenosis and mild bilateral C4-5 foraminal stenosis. There was paravertebral soft tissue thickening from mid T3 to mid T5, likely representing edema related to the pathologic compression deformity vs. possible extraosseous extension of the neoplasm. There were no additional thoracic spinal metastases noted.   Head MRIon 05/03/2018 revealed no intracranial metastatic disease. 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 Incidental mention made of mild paranasal sinus disease.  Hereceived 11cycles ofpembrolizumab(05/24/2018 - 01/10/2019). He toleratedtreatment well. CEAwas 1.3 on 08/30/2018.LDHwas 165 on 11/29/2018.  He completed T4 radiationon 07/07/2018. He receives Xgevamonthly (06/03/2018 - 01/10/2019).  He is s/pcycle #1carboplatin, Alimta and pembrolizumabon 02/08/2019.Cycle #1 was complicated by nausea, vomiting, and diarrhea necessitating hospitalization.  He is day 3 of cycle #2 Alimta and pembrolizumab (02/28/2019).  He hascancer-related painin T4. He is currently taking Fentanyl75 mcg/hr and oxycodone10 mg every 4 hours prn.  Cervical and thoracic spine CT [facility] 01/10/2019 revealed unchanged severe compression deformity/vertebral plana of T4 with a stable degree of retropulsion and focal mild spinal canal stenosis. There was interval enlargement of a right lower lobe pulmonary mass(4.5 x 3.5 cm compared to 3.9 x 2.9 cm)with redemonstrated spiculated pulmonary nodules.  Chest, abdomen, pelvisCTon 01/28/2019 revealed slight interval enlargement of a right lower lobe mass with central necrosis measuring 4.6 x 3.4 cm, previously 4.0 x 3.0 cm. There was no change in right upper lobe nodules(1.4 and 0.8 cm).Therewas almost no residua of left lung nodules, with irregular opacities in the apical left upper lobeandsuperior segment left lower lobe. There was no change in right hilar soft tissue and lymph nodes.There wasvertebra plana deformity of T4andno evidence of new osseous metastatic disease. No evidence of distant metastatic disease in the abdomen or pelvis.The1.6 x 1.1 cmleft adrenal nodule(non-metabolic on prior PET scan),was unchanged.  Symptomatically, he notes some mild nausea.  He is still eating and drinking fluids.  He has had intermittent chills.  Exam is reveals a dry mouth.  Plan: 1.Metastatic high-grade  adenocarcinoma the RIGHT lung He  has received 11cycles of pembrolizumab. He is day 3 of cycle #2 Alimta and pembrolizumab. He is taking folic acid daily.     He has taken his Decadron.  He notes some nausea, but no vomiting.  Discuss plans for IVF and reassessment. 2.Nausea  Nausea appears mild.  He is eating and drinking.  Discuss IVF (500cc) then reassessment. 3.   T4 osseous metastasis Imaging at [facility] was stable. No current issues. 4.Cancer-related pain Painis well controlled. 5.RTC as previously scheduled.  Addendum:  Patient felt much better after IVF.  Patient denied chills.  Patient denied any complaint.  Discussed plan for reassessment tomorrow and possible additional fluids.  Several questions asked and answered.  I discussed the assessment and treatment plan with the patient.  The patient was provided an opportunity to ask questions and all were answered.  The patient agreed with the plan and demonstrated an understanding of the instructions.  The patient was advised to call back if the symptoms worsen or if the condition fails to improve as anticipated.   Lequita Asal, MD, PhD    03/02/2019, 11:33 AM  I, Molly Dorshimer, am acting as Education administrator for [employer]. Mike Gip, MD, PhD.  I, Tena Linebaugh C. Mike Gip, MD, have reviewed the above documentation for accuracy and completeness, and I agree with the above.

## 2019-03-02 NOTE — Progress Notes (Signed)
[facility]  [address] [place], [place] [address] Phone: [phone]  Fax: [phone]   Clinic Day:  03/04/2019  Referring physician: [name], NP  Chief Complaint: Jimmy West is a 68 y.o. male with metastatic adenocarcinoma of the lung who is seen for assessment on day 5 ofcycle #2Alimta and pembrolizumab.  HPI: The patient was last seen in the medical oncology clinic on 02/28/2019. At that time, he reported chills and lack of appetite. He had difficulty hydrating sufficiently. He was feeling better than his first cycle of chemotherapy.  He received 0.5 L of fluids.  He was able to eat and drink fluids in clinic.  During the interim, he reports nausea, fatigue, weakness, and chills that come and go and are currently severe. Yesterday afternoon he started feeling worse and it has persisted to this morning. He reports RLQ abdominal pain. He denies any headache, vomiting or diarrhea. He denies any shortness of breath, cough, or urinary symptoms.   He has not eaten much. Yesterday he ate a biscuit for breakfast and beef tips in the evening. He has been drinking some fluids.    Past Medical History:  Diagnosis Date  . Bulging lumbar disc   . Cancer ([place])    stage 4 lung cancer  . Heart attack [facility])     Past Surgical History:  Procedure Laterality Date  . CARDIAC CATHETERIZATION     two stents  . KNEE SURGERY Left   . PORTA CATH INSERTION N/A 05/21/2018   Procedure: PORTA CATH INSERTION;  Surgeon: Algernon Huxley, MD;  Location: [place] CV [facility];  Service: Cardiovascular;  Laterality: N/A;    Family History  Problem Relation Age of Onset  . Heart failure Father   . Cancer Maternal Aunt   . Heart failure Maternal Uncle   . Dementia Paternal Grandmother   . Cancer Maternal Aunt     Social History:  reports that he quit smoking about 15 years ago. His smoking use included cigarettes. He has a 22.50 pack-year smoking  history. He has never used smokeless tobacco. He reports current alcohol use of about 15.0 standard drinks of alcohol per week. He reports that he does not use drugs.He started smoking at age 68. He is smoking 1 1/2 packs/day. Patient denies known exposures to radiation ortoxins. 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 He has 8 cats. The patient is alone today.  Allergies: No Known Allergies  Current Medications: Current Outpatient Medications  Medication Sig Dispense Refill  . acetaminophen (TYLENOL) 500 MG tablet Take 500 mg by mouth 3 (three) times daily as needed.    Marland Kitchen aspirin EC 81 MG tablet Take 81 mg by mouth daily.    . Calcium 600-400 MG-UNIT CHEW Chew 2 tablets by mouth daily.    Marland Kitchen dexamethasone (DECADRON) 4 MG tablet Take 1 tab two times a day the day before Alimta chemo, then take 2 tabs once a day for 3 days starting the day after chemo. 30 tablet 1  . fentaNYL (DURAGESIC) 75 MCG/HR Place 1 patch onto the skin every 3 (three) days. 10 patch 0  . FLUoxetine (PROZAC) 40 MG capsule Take 1 capsule (40 mg total) by mouth daily. 30 capsule 3  . folic acid (FOLVITE) 1 MG tablet Take 1 tablet (1 mg total) by mouth daily. Start 5-7 days before Alimta chemotherapy. Continue until 21 days after Alimta completed. 100 tablet 3  .  ondansetron (ZOFRAN ODT) 4 MG disintegrating tablet Take 1 tablet (4 mg total) by mouth every 6 (six) hours as needed for nausea or vomiting. 30 tablet 1  . Oxycodone HCl 20 MG TABS Take 0.5-1 tablets (10-20 mg total) by mouth every 4 (four) hours as needed (for severe pain). 90 tablet 0  . pembrolizumab (KEYTRUDA) 100 MG/4ML SOLN Inject 2 mg/kg into the vein every 30 (thirty) days.     Marland Kitchen ibuprofen (ADVIL,MOTRIN) 200 MG tablet Take 200 mg by mouth every 6 (six) hours as needed.    . naloxone (NARCAN) nasal spray 4 mg/0.1 mL 1 spray by nose in the event of opioid overdose. Call EMS (911) immediately if medication is  used. (Patient not taking: Reported on 02/16/2019) 1 kit 0  . polyethylene glycol (MIRALAX / GLYCOLAX) packet Take 17 g by mouth daily.    Marland Kitchen senna (SENOKOT) 8.6 MG tablet Take 1 tablet by mouth as needed.      No current facility-administered medications for this visit.    Facility-Administered Medications Ordered in Other Visits  Medication Dose Route Frequency Provider Last Rate Last Dose  . heparin lock flush 100 unit/mL  500 Units Intravenous Once Corcoran, Melissa C, MD      . sodium chloride flush (NS) 0.9 % injection 10 mL  10 mL Intravenous PRN Lequita Asal, MD   10 mL at 03/04/19 0959    Review of Systems  Constitutional: Positive for chills, malaise/fatigue and weight loss (1lb). Negative for diaphoresis and fever.  HENT: Negative.  Negative for congestion, ear pain, hearing loss, nosebleeds, sinus pain and sore throat.   Eyes: Negative.  Negative for blurred vision, double vision, photophobia and pain.  Respiratory: Negative.  Negative for cough, hemoptysis, sputum production, shortness of breath and wheezing.   Cardiovascular: Negative.  Negative for chest pain, palpitations, orthopnea, leg swelling and PND.  Gastrointestinal: Positive for abdominal pain (RLQ) and nausea. Negative for blood in stool, constipation, diarrhea, heartburn, melena and vomiting.       Decreased appetite  Genitourinary: Negative.  Negative for dysuria, frequency, hematuria and urgency.  Musculoskeletal: Positive for back pain (well controlled). Negative for falls, joint pain, myalgias and neck pain.  Skin: Negative.  Negative for itching and rash.  Neurological: Positive for tingling (in mid back, occasional), sensory change (intermittent numbness in mid back; chronic numbness to LEFT heel 2/2 disc issues) and weakness (generalized). Negative for dizziness, tremors, speech change, focal weakness and headaches.  Endo/Heme/Allergies: Negative.  Does not bruise/bleed easily.  Psychiatric/Behavioral:  Negative.  Negative for depression, memory loss and substance abuse. The patient is not nervous/anxious and does not have insomnia.   All other systems reviewed and are negative.  Performance status (ECOG): 2  Vitals Blood pressure 106/70, pulse 78, temperature 98.5 F (36.9 C), temperature source Tympanic, resp. rate 18, height 5' 6" (1.676 m), weight 148 lb 9.4 oz (67.4 kg), SpO2 100 %.   Physical Exam  Constitutional: He is oriented to person, place, and time. He appears distressed.  HENT:  Head: Normocephalic and atraumatic.  Wearing a mask.  Brown hair with slight graying. Mustache. Dentures.  Dry mouth.  Eyes: Pupils are equal, round, and reactive to light. Conjunctivae and EOM are normal. No scleral icterus.  Glasses.  Gray blue eyes.  Neck: Normal range of motion. Neck supple. No JVD present.  Cardiovascular: Normal rate, regular rhythm and normal heart sounds. Exam reveals no gallop and no friction rub.  No murmur heard. Pulmonary/Chest: Effort  normal and breath sounds normal. No respiratory distress. He has no wheezes. He has no rales.  Abdominal: Soft. Bowel sounds are normal. He exhibits no distension and no mass. There is abdominal tenderness in the right lower quadrant. There is no rebound and no guarding.  Musculoskeletal: Normal range of motion.        General: Tenderness (slight at T4) present. No edema.  Lymphadenopathy:    He has no cervical adenopathy.    He has no axillary adenopathy.       Right: No supraclavicular adenopathy present.       Left: No supraclavicular adenopathy present.  Neurological: He is alert and oriented to person, place, and time.  Skin: Skin is warm and dry. No rash noted. He is not diaphoretic. No erythema.  Psychiatric: He has a normal mood and affect. His behavior is normal. Judgment and thought content normal.  Tearful at times.  Nursing note and vitals reviewed.   Infusion on 03/04/2019  Component Date Value Ref Range Status  .  Sodium 03/04/2019 134* 135 - 145 mmol/L Final  . Potassium 03/04/2019 3.3* 3.5 - 5.1 mmol/L Final  . Chloride 03/04/2019 103  98 - 111 mmol/L Final  . CO2 03/04/2019 20* 22 - 32 mmol/L Final  . Glucose, Bld 03/04/2019 134* 70 - 99 mg/dL Final  . BUN 03/04/2019 21  8 - 23 mg/dL Final  . Creatinine, Ser 03/04/2019 0.84  0.61 - 1.24 mg/dL Final  . Calcium 03/04/2019 8.7* 8.9 - 10.3 mg/dL Final  . GFR calc non Af Amer 03/04/2019 >60  >60 mL/min Final  . GFR calc Af Amer 03/04/2019 >60  >60 mL/min Final  . Anion gap 03/04/2019 11  5 - 15 Final   Performed at [facility], [address], [place], [place] [address]  . Magnesium 03/04/2019 2.2  1.7 - 2.4 mg/dL Final   Performed at [facility], [address] [place], [place] [address]    Assessment:  Sirus Labrie is a 68 y.o. male with metastatichigh-grade adenocarcinomaof the right lungs/p CT-guided biopsy of a RLL lung mass on 05/11/2018. Pathologyrevealed an invasive high-grade adenocarcinoma, with predominantly solid growth pattern. The neoplastic cells were TTF-1 (+), Napsin A (+), and P40 (+). He has a T4 vertebral metastasis. Clinical stage is T4N1M1.  There was not enough material for [facility] One testing. PDL-1revealed TPS 90%.  PET scanon 04/30/2018 revealed a 3.4 cm hypermetabolic RLL pulmonary mass (SUV 13.2), 11 mm pulmonary nodule in the LEFT lung (SUV 4.5), RIGHT paratracheal lymph node (SUV 5.1), and hypermetabolic activity within the T4 vertebral body (SUV 10.2).   Thoracic spine MRIon 05/03/2018 revealed T4 metastasiswith a 40% pathologic compression deformity and 5 mm of retropulsion of the vertebral body. Retropulsion results in mild spinal canal stenosis and mild bilateral C4-5 foraminal stenosis. There was paravertebral soft tissue thickening from mid T3 to mid T5, likely representing edema related to the pathologic compression deformity vs. possible extraosseous extension  of the neoplasm. There were no additional thoracic spinal metastases noted.   Head MRIon 05/03/2018 revealed no intracranial metastatic disease. 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 Incidental mention made of mild paranasal sinus disease.  Hereceived 11cycles ofpembrolizumab(05/24/2018 - 01/10/2019). He toleratedtreatment well. CEAwas 1.3 on 08/30/2018.LDHwas 165 on 11/29/2018.  He completed T4 radiationon 07/07/2018. He receives Xgevamonthly (06/03/2018 -  01/10/2019).  He received 1 cycle of carboplatin, Alimta, and pembrolizumab on 02/08/2019.  Cycle #1 was complicated by nausea, vomiting, and diarrhea necessitating hospitalization.  Decision made to hold carboplatin with his second dose.  He is currently day 5 s/pcycle #2 Alimta and pembrolizumab (02/28/2019).  He hascancer-related painin T4. He is currently taking Fentanyl75 mcg/hr and oxycodone10 mg every 4 hours prn.  Cervical and thoracic spine CT [facility]/10/2019 revealed unchanged severe compression deformity/vertebral plana of T4 with a stable degree of retropulsion and focal mild spinal canal stenosis. There was interval enlargement of a right lower lobe pulmonary mass(4.5 x 3.5 cm compared to 3.9 x 2.9 cm)with redemonstrated spiculated pulmonary nodules.  Chest, abdomen, pelvisCTon 01/28/2019 revealed slight interval enlargement of a right lower lobe mass with central necrosis measuring 4.6 x 3.4 cm, previously 4.0 x 3.0 cm. There was no change in right upper lobe nodules(1.4 and 0.8 cm).Therewas almost no residua of left lung nodules, with irregular opacities in the apical left upper lobeandsuperior segment left lower lobe. There was no change in right hilar soft tissue and lymph nodes.There wasvertebra plana  deformity of T4andno evidence of new osseous metastatic disease. No evidence of distant metastatic disease in the abdomen or pelvis.The1.6 x 1.1 cmleft adrenal nodule(non-metabolic on prior PET scan),was unchanged.  Symptomatically, he is extremely fatigued.  He has nausea and intermittent chills.  He is dehydrated.  Plan: 1.  Labs today:  CBC with diff, BMP, Mg, lactic acid. 2.  Urinalysis and culture. 3.  Metastatic high-grade adenocarcinoma the RIGHT lung Clinically, hehas struggled since his first cycle of chemotherapy (as compared to immunotherapy). He has received 11cycles of pembrolizumab. He is day5of cycle #2pembrolizumab and Alimta. He has had significant toxicity with chemotherapy.   He is developing similar symptoms as he did with his first cycle of chemotherapy, but not as severe. Clinically, he is struggling.   Discuss readdressing further chemotherapy after recovery from this cycle. 4.Nausea  Patient with nausea and poor oral intake overnight.  Clinically dehydrated.  IVF NS with 10 meq KCL/liter over 4 hours.   Readdress additional fluids after I liter.  Ondansetron 8 mg IV. 5. Hypokalemia  IVF with potassium.  Patient denies any diarrhea.  Supplement as needed. Encourage patient to pick up Levaquin prescribed during hospitalization. 6. Chills  Etiology unclear.  Blood and urine cultures obtained.  Lactic acid normal.  Follow abdominal exam closely as episode of colitis with last admission. 7.   Cancer related pain Pain well controlled on current regimen.  Paint on Fentanyl 75 mcg/hr patch and oxycodone 10 mg po q 4 hours prn pain. 8.   Reassess after fluids.  Addendum:  Clinically, he has not improved significantly throughout the day.  Nausea is well controlled.  He has eaten and drank very little.  He produced a small amount of urine output for the urinalysis  and culture after multiple attempts.  He would not do well if discharged home.  Discuss admitting to the hospital for at least observation for continuation of IVF and anti-emetics.  Patient agreeable.  I spoke with Dr Remigio Eisenmenger.  I discussed the assessment and treatment plan with the patient.  The patient was provided an opportunity to ask questions and all were answered.  The patient agreed with the plan and demonstrated an understanding of the instructions.  The patient was advised to call back if the symptoms worsen or if the condition fails to improve as anticipated.  I provided 25 minutes of  face-to-face time during this this encounter and > 50% was spent counseling as documented under my assessment and plan.    Lequita Asal, MD, PhD    03/04/2019, 11:01 AM  I, Molly Dorshimer, am acting as Education administrator for [employer]. Mike Gip, MD, PhD.  I, Melissa C. Mike Gip, MD, have reviewed the above documentation for accuracy and completeness, and I agree with the above.

## 2019-03-03 ENCOUNTER — Inpatient Hospital Stay: Payer: Medicare Other

## 2019-03-03 ENCOUNTER — Other Ambulatory Visit: Payer: Self-pay

## 2019-03-04 ENCOUNTER — Inpatient Hospital Stay: Payer: Medicare Other

## 2019-03-04 ENCOUNTER — Other Ambulatory Visit: Payer: Self-pay

## 2019-03-04 ENCOUNTER — Inpatient Hospital Stay (HOSPITAL_BASED_OUTPATIENT_CLINIC_OR_DEPARTMENT_OTHER): Payer: Medicare Other | Admitting: Hematology and Oncology

## 2019-03-04 ENCOUNTER — Encounter: Payer: Self-pay | Admitting: Internal Medicine

## 2019-03-04 ENCOUNTER — Observation Stay
Admission: AD | Admit: 2019-03-04 | Discharge: 2019-03-05 | Disposition: A | Discharge status: Home / Self Care | Payer: Medicare Other | Source: Ambulatory Visit | Attending: Internal Medicine | Admitting: Internal Medicine

## 2019-03-04 ENCOUNTER — Encounter: Payer: Self-pay | Admitting: Hematology and Oncology

## 2019-03-04 VITALS — BP 106/70 | HR 78 | Temp 98.5°F | Resp 18 | Ht 66.0 in | Wt 148.6 lb

## 2019-03-04 DIAGNOSIS — C7951 Secondary malignant neoplasm of bone: Secondary | ICD-10-CM | POA: Diagnosis not present

## 2019-03-04 DIAGNOSIS — I2510 Atherosclerotic heart disease of native coronary artery without angina pectoris: Secondary | ICD-10-CM | POA: Diagnosis not present

## 2019-03-04 DIAGNOSIS — R6883 Chills (without fever): Secondary | ICD-10-CM | POA: Diagnosis not present

## 2019-03-04 DIAGNOSIS — Z8249 Family history of ischemic heart disease and other diseases of the circulatory system: Secondary | ICD-10-CM | POA: Insufficient documentation

## 2019-03-04 DIAGNOSIS — K5900 Constipation, unspecified: Secondary | ICD-10-CM | POA: Insufficient documentation

## 2019-03-04 DIAGNOSIS — C3431 Malignant neoplasm of lower lobe, right bronchus or lung: Secondary | ICD-10-CM

## 2019-03-04 DIAGNOSIS — I252 Old myocardial infarction: Secondary | ICD-10-CM | POA: Insufficient documentation

## 2019-03-04 DIAGNOSIS — E876 Hypokalemia: Secondary | ICD-10-CM | POA: Diagnosis not present

## 2019-03-04 DIAGNOSIS — C3491 Malignant neoplasm of unspecified part of right bronchus or lung: Secondary | ICD-10-CM | POA: Diagnosis not present

## 2019-03-04 DIAGNOSIS — Z79899 Other long term (current) drug therapy: Secondary | ICD-10-CM | POA: Insufficient documentation

## 2019-03-04 DIAGNOSIS — Z809 Family history of malignant neoplasm, unspecified: Secondary | ICD-10-CM | POA: Insufficient documentation

## 2019-03-04 DIAGNOSIS — Z5112 Encounter for antineoplastic immunotherapy: Secondary | ICD-10-CM | POA: Diagnosis not present

## 2019-03-04 DIAGNOSIS — E860 Dehydration: Principal | ICD-10-CM | POA: Insufficient documentation

## 2019-03-04 DIAGNOSIS — Z7982 Long term (current) use of aspirin: Secondary | ICD-10-CM | POA: Diagnosis not present

## 2019-03-04 DIAGNOSIS — Z791 Long term (current) use of non-steroidal anti-inflammatories (NSAID): Secondary | ICD-10-CM | POA: Insufficient documentation

## 2019-03-04 DIAGNOSIS — F329 Major depressive disorder, single episode, unspecified: Secondary | ICD-10-CM | POA: Insufficient documentation

## 2019-03-04 DIAGNOSIS — R110 Nausea: Secondary | ICD-10-CM | POA: Diagnosis not present

## 2019-03-04 DIAGNOSIS — C3490 Malignant neoplasm of unspecified part of unspecified bronchus or lung: Secondary | ICD-10-CM | POA: Diagnosis not present

## 2019-03-04 DIAGNOSIS — Z20828 Contact with and (suspected) exposure to other viral communicable diseases: Secondary | ICD-10-CM | POA: Diagnosis not present

## 2019-03-04 DIAGNOSIS — R5383 Other fatigue: Secondary | ICD-10-CM | POA: Insufficient documentation

## 2019-03-04 DIAGNOSIS — G893 Neoplasm related pain (acute) (chronic): Secondary | ICD-10-CM

## 2019-03-04 DIAGNOSIS — R1110 Vomiting, unspecified: Secondary | ICD-10-CM | POA: Diagnosis present

## 2019-03-04 DIAGNOSIS — R112 Nausea with vomiting, unspecified: Secondary | ICD-10-CM | POA: Diagnosis not present

## 2019-03-04 DIAGNOSIS — Z87891 Personal history of nicotine dependence: Secondary | ICD-10-CM | POA: Insufficient documentation

## 2019-03-04 LAB — URINALYSIS, COMPLETE (UACMP) WITH MICROSCOPIC
Bilirubin Urine: NEGATIVE
Glucose, UA: NEGATIVE mg/dL
Hgb urine dipstick: NEGATIVE
Ketones, ur: NEGATIVE mg/dL
Leukocytes,Ua: NEGATIVE
Nitrite: NEGATIVE
Protein, ur: NEGATIVE mg/dL
Specific Gravity, Urine: 1.015 (ref 1.005–1.030)
pH: 6 (ref 5.0–8.0)

## 2019-03-04 LAB — LACTIC ACID, PLASMA: Lactic Acid, Venous: 0.9 mmol/L (ref 0.5–1.9)

## 2019-03-04 LAB — CBC WITH DIFFERENTIAL/PLATELET
Abs Immature Granulocytes: 0.03 10*3/uL (ref 0.00–0.07)
Basophils Absolute: 0 10*3/uL (ref 0.0–0.1)
Basophils Relative: 0 %
Eosinophils Absolute: 0.4 10*3/uL (ref 0.0–0.5)
Eosinophils Relative: 5 %
HCT: 36.8 % — ABNORMAL LOW (ref 39.0–52.0)
Hemoglobin: 12.6 g/dL — ABNORMAL LOW (ref 13.0–17.0)
Immature Granulocytes: 0 %
Lymphocytes Relative: 18 %
Lymphs Abs: 1.3 10*3/uL (ref 0.7–4.0)
MCH: 29.8 pg (ref 26.0–34.0)
MCHC: 34.2 g/dL (ref 30.0–36.0)
MCV: 87 fL (ref 80.0–100.0)
Monocytes Absolute: 0.1 10*3/uL (ref 0.1–1.0)
Monocytes Relative: 1 %
Neutro Abs: 5.3 10*3/uL (ref 1.7–7.7)
Neutrophils Relative %: 76 %
Platelets: 416 10*3/uL — ABNORMAL HIGH (ref 150–400)
RBC: 4.23 MIL/uL (ref 4.22–5.81)
RDW: 14.1 % (ref 11.5–15.5)
WBC: 7.1 10*3/uL (ref 4.0–10.5)
nRBC: 0 % (ref 0.0–0.2)

## 2019-03-04 LAB — BASIC METABOLIC PANEL
Anion gap: 11 (ref 5–15)
BUN: 21 mg/dL (ref 8–23)
CO2: 20 mmol/L — ABNORMAL LOW (ref 22–32)
Calcium: 8.7 mg/dL — ABNORMAL LOW (ref 8.9–10.3)
Chloride: 103 mmol/L (ref 98–111)
Creatinine, Ser: 0.84 mg/dL (ref 0.61–1.24)
GFR calc Af Amer: >60 mL/min (ref >60)
GFR calc non Af Amer: >60 mL/min (ref >60)
Glucose, Bld: 134 mg/dL — ABNORMAL HIGH (ref 70–99)
Potassium: 3.3 mmol/L — ABNORMAL LOW (ref 3.5–5.1)
Sodium: 134 mmol/L — ABNORMAL LOW (ref 135–145)

## 2019-03-04 LAB — MAGNESIUM: Magnesium: 2.2 mg/dL (ref 1.7–2.4)

## 2019-03-04 MED ORDER — EPINEPHRINE 1 MG/10ML IJ SOSY
0.2500 mg | PREFILLED_SYRINGE | Freq: Once | INTRAMUSCULAR | Status: DC | PRN
  Filled 2019-03-04: qty 10

## 2019-03-04 MED ORDER — SODIUM CHLORIDE 0.9 % IV SOLN
Freq: Once | INTRAVENOUS | Status: AC
  Administered 2019-03-04: 12:00:00 via INTRAVENOUS
  Filled 2019-03-04: qty 5

## 2019-03-04 MED ORDER — SODIUM CHLORIDE 0.9 % IV SOLN: Freq: Once | INTRAVENOUS | Status: DC

## 2019-03-04 MED ORDER — POTASSIUM CHLORIDE IN NACL 20-0.9 MEQ/L-% IV SOLN
INTRAVENOUS | Status: DC
  Administered 2019-03-04 – 2019-03-05 (×3): via INTRAVENOUS
  Filled 2019-03-04 (×6): qty 1000

## 2019-03-04 MED ORDER — SODIUM CHLORIDE 0.9 % IV SOLN
Freq: Once | INTRAVENOUS | Status: DC | PRN
  Filled 2019-03-04: qty 250

## 2019-03-04 MED ORDER — ENOXAPARIN SODIUM 40 MG/0.4ML ~~LOC~~ SOLN
40.0000 mg | SUBCUTANEOUS | Status: DC
  Administered 2019-03-04: 40 mg via SUBCUTANEOUS
  Filled 2019-03-04 (×2): qty 0.4

## 2019-03-04 MED ORDER — DIPHENHYDRAMINE HCL 50 MG/ML IJ SOLN: 25.0000 mg | Freq: Once | INTRAMUSCULAR | Status: DC | PRN

## 2019-03-04 MED ORDER — HEPARIN SOD (PORK) LOCK FLUSH 100 UNIT/ML IV SOLN: 500.0000 [IU] | Freq: Once | INTRAVENOUS | Status: DC | PRN

## 2019-03-04 MED ORDER — ALBUTEROL SULFATE (2.5 MG/3ML) 0.083% IN NEBU: 2.5000 mg | INHALATION_SOLUTION | RESPIRATORY_TRACT | Status: DC | PRN

## 2019-03-04 MED ORDER — ACETAMINOPHEN 325 MG PO TABS: 650.0000 mg | ORAL_TABLET | Freq: Four times a day (QID) | ORAL | Status: DC | PRN

## 2019-03-04 MED ORDER — PANTOPRAZOLE SODIUM 40 MG PO TBEC
40.0000 mg | DELAYED_RELEASE_TABLET | Freq: Two times a day (BID) | ORAL | Status: DC
  Administered 2019-03-05: 40 mg via ORAL
  Filled 2019-03-04: qty 1

## 2019-03-04 MED ORDER — POLYETHYLENE GLYCOL 3350 17 G PO PACK
17.0000 g | PACK | Freq: Every day | ORAL | Status: DC
  Filled 2019-03-04: qty 1

## 2019-03-04 MED ORDER — ALBUTEROL SULFATE (2.5 MG/3ML) 0.083% IN NEBU
2.5000 mg | INHALATION_SOLUTION | Freq: Once | RESPIRATORY_TRACT | Status: DC | PRN
  Filled 2019-03-04: qty 3

## 2019-03-04 MED ORDER — POTASSIUM CHLORIDE CRYS ER 20 MEQ PO TBCR
40.0000 meq | EXTENDED_RELEASE_TABLET | Freq: Once | ORAL | Status: AC
  Administered 2019-03-04: 21:00:00 40 meq via ORAL
  Filled 2019-03-04: qty 2

## 2019-03-04 MED ORDER — FOLIC ACID 1 MG PO TABS
1.0000 mg | ORAL_TABLET | Freq: Every day | ORAL | Status: DC
  Administered 2019-03-05: 08:00:00 1 mg via ORAL
  Filled 2019-03-04: qty 1

## 2019-03-04 MED ORDER — FLUOXETINE HCL 20 MG PO CAPS
40.0000 mg | ORAL_CAPSULE | Freq: Every day | ORAL | Status: DC
  Administered 2019-03-05: 40 mg via ORAL
  Filled 2019-03-04 (×2): qty 2

## 2019-03-04 MED ORDER — POLYETHYLENE GLYCOL 3350 17 G PO PACK
17.0000 g | PACK | Freq: Every day | ORAL | Status: DC | PRN
  Administered 2019-03-04: 21:00:00 17 g via ORAL
  Filled 2019-03-04: qty 1

## 2019-03-04 MED ORDER — FENTANYL 75 MCG/HR TD PT72
1.0000 | MEDICATED_PATCH | TRANSDERMAL | Status: DC
  Administered 2019-03-04: 19:00:00 1 via TRANSDERMAL
  Filled 2019-03-04 (×2): qty 1

## 2019-03-04 MED ORDER — SODIUM CHLORIDE 0.9% FLUSH
3.0000 mL | Freq: Once | INTRAVENOUS | Status: DC | PRN
  Filled 2019-03-04: qty 3

## 2019-03-04 MED ORDER — ONDANSETRON HCL 4 MG/2ML IJ SOLN
8.0000 mg | Freq: Once | INTRAMUSCULAR | Status: AC
  Administered 2019-03-04: 8 mg via INTRAVENOUS

## 2019-03-04 MED ORDER — DIPHENHYDRAMINE HCL 50 MG/ML IJ SOLN: 50.0000 mg | Freq: Once | INTRAMUSCULAR | Status: DC | PRN

## 2019-03-04 MED ORDER — ONDANSETRON HCL 4 MG/2ML IJ SOLN: 4.0000 mg | Freq: Four times a day (QID) | INTRAMUSCULAR | Status: DC | PRN

## 2019-03-04 MED ORDER — SODIUM CHLORIDE 0.9% FLUSH
10.0000 mL | INTRAVENOUS | Status: DC | PRN
  Administered 2019-03-04: 10 mL via INTRAVENOUS
  Filled 2019-03-04: qty 10

## 2019-03-04 MED ORDER — SODIUM CHLORIDE 0.9% FLUSH
10.0000 mL | Freq: Once | INTRAVENOUS | Status: DC | PRN
  Filled 2019-03-04: qty 10

## 2019-03-04 MED ORDER — HEPARIN SOD (PORK) LOCK FLUSH 100 UNIT/ML IV SOLN: 250.0000 [IU] | Freq: Once | INTRAVENOUS | Status: DC | PRN

## 2019-03-04 MED ORDER — LORAZEPAM 0.5 MG PO TABS
0.5000 mg | ORAL_TABLET | Freq: Four times a day (QID) | ORAL | Status: DC | PRN
  Administered 2019-03-04: 22:00:00 0.5 mg via ORAL
  Filled 2019-03-04: qty 1

## 2019-03-04 MED ORDER — SENNA 8.6 MG PO TABS
2.0000 | ORAL_TABLET | Freq: Two times a day (BID) | ORAL | Status: DC
  Administered 2019-03-04: 17.2 mg via ORAL
  Filled 2019-03-04 (×2): qty 2

## 2019-03-04 MED ORDER — ONDANSETRON HCL 4 MG PO TABS: 4.0000 mg | ORAL_TABLET | Freq: Four times a day (QID) | ORAL | Status: DC | PRN

## 2019-03-04 MED ORDER — OXYCODONE HCL 5 MG PO TABS
10.0000 mg | ORAL_TABLET | ORAL | Status: DC | PRN
  Administered 2019-03-04 – 2019-03-05 (×3): 20 mg via ORAL
  Filled 2019-03-04 (×3): qty 4

## 2019-03-04 MED ORDER — ALTEPLASE 2 MG IJ SOLR: 2.0000 mg | Freq: Once | INTRAMUSCULAR | Status: DC | PRN

## 2019-03-04 MED ORDER — HEPARIN SOD (PORK) LOCK FLUSH 100 UNIT/ML IV SOLN
500.0000 [IU] | Freq: Once | INTRAVENOUS | Status: AC
  Administered 2019-03-04: 16:00:00 500 [IU] via INTRAVENOUS

## 2019-03-04 MED ORDER — SODIUM CHLORIDE 0.9 % IV BOLUS
1000.0000 mL | Freq: Once | INTRAVENOUS | Status: AC
  Administered 2019-03-04: 1000 mL via INTRAVENOUS

## 2019-03-04 MED ORDER — SODIUM CHLORIDE 0.9 % IV SOLN
Freq: Once | INTRAVENOUS | Status: DC
  Filled 2019-03-04: qty 250

## 2019-03-04 MED ORDER — METHYLPREDNISOLONE SODIUM SUCC 125 MG IJ SOLR: 125.0000 mg | Freq: Once | INTRAMUSCULAR | Status: DC | PRN

## 2019-03-04 MED ORDER — ASPIRIN EC 81 MG PO TBEC
81.0000 mg | DELAYED_RELEASE_TABLET | Freq: Every day | ORAL | Status: DC
  Administered 2019-03-04 – 2019-03-05 (×2): 81 mg via ORAL
  Filled 2019-03-04 (×2): qty 1

## 2019-03-04 MED ORDER — ACETAMINOPHEN 650 MG RE SUPP: 650.0000 mg | Freq: Four times a day (QID) | RECTAL | Status: DC | PRN

## 2019-03-04 NOTE — H&P (Signed)
[place] [facility] [place] NAME: Jimmy West    MR#:  [id number]  DATE OF BIRTH:  Nov 12, 1950  DATE OF ADMISSION:  03/04/2019  PRIMARY CARE PHYSICIAN: [name], NP   REQUESTING/REFERRING PHYSICIAN: Dr. Mike Gip  CHIEF COMPLAINT:  No chief complaint on file. Nausea, abdominal pain  HISTORY OF PRESENT ILLNESS:  Jimmy West  is a 68 y.o. male with a known history of CAD, metastatic high-grade adenocarcinoma of the right lung presents to the hospital as a direct admission from the cancer center due to nausea, abdominal pain and unable to eat or drink.  He had similar problems with chemotherapy during his first cycle.  With his second cycle his symptoms are a little better but still persistent.  He has been getting regular IV fluid infusions at the cancer center.  Complains of some lower abdominal pain due to constipation.  Afebrile.  No cough or shortness of breath.  He recently received Alimta and pembrolizumab.  During previous admission after chemotherapy he was also found to have enteritis and treated with Levaquin.  PAST MEDICAL HISTORY:   Past Medical History:  Diagnosis Date  . Bulging lumbar disc   . Cancer ([place])    stage 4 lung cancer  . Heart attack ([place])     PAST SURGICAL HISTORY:   Past Surgical History:  Procedure Laterality Date  . CARDIAC CATHETERIZATION     two stents  . KNEE SURGERY Left   . PORTA CATH INSERTION N/A 05/21/2018   Procedure: PORTA CATH INSERTION;  Surgeon: Algernon Huxley, MD;  Location: [place] [facility];  Service: Cardiovascular;  Laterality: N/A;    SOCIAL HISTORY:   Social History   Tobacco Use  . Smoking status: Former Smoker    Packs/day: 1.50    Years: 15.00    Pack years: 22.50    Types: Cigarettes    Quit date: 11/03/2003    Years since quitting: 15.3  . Smokeless tobacco: Never Used  Substance Use Topics  . Alcohol use: Yes    Alcohol/week: 15.0 standard drinks    Types:  15 Shots of liquor per week    FAMILY HISTORY:   Family History  Problem Relation Age of Onset  . Heart failure Father   . Cancer Maternal Aunt   . Heart failure Maternal Uncle   . Dementia Paternal Grandmother   . Cancer Maternal Aunt     DRUG ALLERGIES:  No Known Allergies  REVIEW OF SYSTEMS:   Review of Systems  Constitutional: Positive for malaise/fatigue. Negative for chills and fever.  HENT: Negative for sore throat.   Eyes: Negative for blurred vision, double vision and pain.  Respiratory: Negative for cough, hemoptysis, shortness of breath and wheezing.   Cardiovascular: Negative for chest pain, palpitations, orthopnea and leg swelling.  Gastrointestinal: Positive for abdominal pain, constipation and nausea. Negative for diarrhea, heartburn and vomiting.  Genitourinary: Negative for dysuria and hematuria.  Musculoskeletal: Negative for back pain and joint pain.  Skin: Negative for rash.  Neurological: Negative for sensory change, speech change, focal weakness and headaches.  Endo/Heme/Allergies: Does not bruise/bleed easily.  Psychiatric/Behavioral: Negative for depression. The patient is not nervous/anxious.     MEDICATIONS AT HOME:   Prior to Admission medications   Medication Sig Start Date End Date Taking? Authorizing Provider  acetaminophen (TYLENOL) 500 MG tablet Take 500 mg by mouth 3 (three) times daily as needed.    [provider]  aspirin EC  81 MG tablet Take 81 mg by mouth daily.    [provider]  Calcium 600-400 MG-UNIT CHEW Chew 2 tablets by mouth daily.    [provider]  dexamethasone (DECADRON) 4 MG tablet Take 1 tab two times a day the day before Alimta chemo, then take 2 tabs once a day for 3 days starting the day after chemo. 01/31/19   Lequita Asal, MD  fentaNYL (DURAGESIC) 75 MCG/HR Place 1 patch onto the skin every 3 (three) days. 03/01/19   Borders, Kirt Boys, NP  FLUoxetine (PROZAC) 40 MG capsule Take 1  capsule (40 mg total) by mouth daily. 03/01/19   Borders, Kirt Boys, NP  folic acid (FOLVITE) 1 MG tablet Take 1 tablet (1 mg total) by mouth daily. Start 5-7 days before Alimta chemotherapy. Continue until 21 days after Alimta completed. 01/31/19   Lequita Asal, MD  ibuprofen (ADVIL,MOTRIN) 200 MG tablet Take 200 mg by mouth every 6 (six) hours as needed.    [provider]  naloxone [facility]) nasal spray 4 mg/0.1 mL 1 spray by nose in the event of opioid overdose. Call EMS (911) immediately if medication is used. Patient not taking: Reported on 02/16/2019 07/27/18   Borders, Kirt Boys, NP  ondansetron (ZOFRAN ODT) 4 MG disintegrating tablet Take 1 tablet (4 mg total) by mouth every 6 (six) hours as needed for nausea or vomiting. 02/14/19   Lequita Asal, MD  Oxycodone HCl 20 MG TABS Take 0.5-1 tablets (10-20 mg total) by mouth every 4 (four) hours as needed (for severe pain). 02/09/19   Borders, Kirt Boys, NP  pembrolizumab (KEYTRUDA) 100 MG/4ML SOLN Inject 2 mg/kg into the vein every 30 (thirty) days.     [provider]  polyethylene glycol (MIRALAX / GLYCOLAX) packet Take 17 g by mouth daily.    [provider]  senna (SENOKOT) 8.6 MG tablet Take 1 tablet by mouth as needed.     [provider]     VITAL SIGNS:  Blood pressure 97/84, pulse 73, temperature 98.4 F (36.9 C), resp. rate 18, SpO2 100 %.  PHYSICAL EXAMINATION:  Physical Exam  GENERAL:  68 y.o.-year-old patient lying in the bed with no acute distress.  EYES: Pupils equal, round, reactive to light and accommodation. No scleral icterus. Extraocular muscles intact.  HEENT: Head atraumatic, normocephalic. Oropharynx and nasopharynx clear. No oropharyngeal erythema, dry oral mucosa  NECK:  Supple, no jugular venous distention. No thyroid enlargement, no tenderness.  LUNGS: Normal breath sounds bilaterally, no wheezing, rales, rhonchi. No use of accessory muscles of respiration.  CARDIOVASCULAR:  S1, S2 normal. No murmurs, rubs, or gallops.  ABDOMEN: Soft, nontender, nondistended. Bowel sounds present. No organomegaly or mass.  EXTREMITIES: No pedal edema, cyanosis, or clubbing. + 2 pedal & radial pulses b/l.   NEUROLOGIC: Cranial nerves II through XII are intact. No focal Motor or sensory deficits appreciated b/l PSYCHIATRIC: The patient is alert and oriented x 3. Good affect.  SKIN: No obvious rash, lesion, or ulcer.   LABORATORY PANEL:   CBC Recent Labs  Lab 03/04/19 0958  WBC 7.1  HGB 12.6*  HCT 36.8*  PLT 416*   ------------------------------------------------------------------------------------------------------------------  Chemistries  Recent Labs  Lab 02/28/19 0917 03/04/19 0958  NA 136 134*  K 3.8 3.3*  CL 103 103  CO2 22 20*  GLUCOSE 140* 134*  BUN 13 21  CREATININE 1.00 0.84  CALCIUM 9.6 8.7*  MG 2.1 2.2  AST 31  --  ALT 33  --   ALKPHOS 76  --   BILITOT 0.6  --    ------------------------------------------------------------------------------------------------------------------  Cardiac Enzymes No results for input(s): TROPONINI in the last 168 hours. ------------------------------------------------------------------------------------------------------------------  RADIOLOGY:  No results found.   IMPRESSION AND PLAN:   *Dehydration secondary to persistent nausea from chemotherapy.  We will bolus IV fluids now and continue maintenance fluids.  Encouraged him to drink fluids orally.  Nausea medications added.  * metastatic high-grade adenocarcinoma of the right lung Follow-up at cancer center outpatient  *Hypokalemia.  Will replace orally.  DVT prophylaxis with Lovenox  All the records are reviewed and case discussed with ED provider. Management plans discussed with the patient, family and they are in agreement.  CODE STATUS: FULL CODE  TOTAL TIME TAKING CARE OF THIS PATIENT: 35 minutes.   Neita Carp M.D on 03/04/2019 at 7:57  PM  Between 7am to 6pm - Pager - [phone]  After 6pm go to [email or web address] - password [email or web address] [facility]  Office  [phone]  CC: Primary care physician; [name], NP  Note: This dictation was prepared with Dragon dictation along with smaller phrase technology. Any transcriptional errors that result from this process are unintentional.

## 2019-03-04 NOTE — Progress Notes (Signed)
Advance care planning  Purpose of Encounter Dehydration, stage IV adenocarcinoma of the right lung  Parties in Attendance Patient  Patients Decisional capacity Alert and oriented.  Able to make medical decisions  Does not have any family here and no documented medical power of attorney.  No ACP documents in place  Discussed in detail regarding dehydration, nausea, stage IV adenocarcinoma of right lung.  Treatment plan , prognosis discussed.  All questions answered.  CODE STATUS discussed and patient wishes to be full code.  Does not want to be on long-term ventilator.  Orders entered and CODE STATUS changed  FULL CODE  Time spent - 17 minutes

## 2019-03-04 NOTE — Progress Notes (Signed)
Patient finally able to void and a urine sample was obtained and sent to the lab.  Patient to be admitted.  Port access redressed with antimicrobial patch and new tegaderm.  Patient's skin very sensitive around port from tegaderm in the past.

## 2019-03-04 NOTE — Plan of Care (Signed)
  Problem: Education: Goal: Knowledge of the prescribed therapeutic regimen will improve 03/04/2019 1940 by Rowe Robert, RN Outcome: Progressing 03/04/2019 1938 by Rowe Robert, RN Outcome: Progressing

## 2019-03-04 NOTE — Progress Notes (Signed)
Patient still c/o with getting up and moving around the symptoms resurfaced/ feeling very cold with chills and still having some weakness no change for the better at this time.

## 2019-03-04 NOTE — Patient Instructions (Signed)
Dehydration, Adult  Dehydration is when there is not enough fluid or water in your body. This happens when you lose more fluids than you take in. Dehydration can range from mild to very bad. It should be treated right away to keep it from getting very bad. Symptoms of mild dehydration may include:  Thirst.  Dry lips.  Slightly dry mouth.  Dry, warm skin.  Dizziness. Symptoms of moderate dehydration may include:  Very dry mouth.  Muscle cramps.  Dark pee (urine). Pee may be the color of tea.  Your body making less pee.  Your eyes making fewer tears.  Heartbeat that is uneven or faster than normal (palpitations).  Headache.  Light-headedness, especially when you stand up from sitting.  Fainting (syncope). Symptoms of very bad dehydration may include:  Changes in skin, such as: ? Cold and clammy skin. ? Blotchy (mottled) or pale skin. ? Skin that does not quickly return to normal after being lightly pinched and let go (poor skin turgor).  Changes in body fluids, such as: ? Feeling very thirsty. ? Your eyes making fewer tears. ? Not sweating when body temperature is high, such as in hot weather. ? Your body making very little pee.  Changes in vital signs, such as: ? Weak pulse. ? Pulse that is more than 100 beats a minute when you are sitting still. ? Fast breathing. ? Low blood pressure.  Other changes, such as: ? Sunken eyes. ? Cold hands and feet. ? Confusion. ? Lack of energy (lethargy). ? Trouble waking up from sleep. ? Short-term weight loss. ? Unconsciousness. Follow these instructions at home:   If told by your doctor, drink an ORS: ? Make an ORS by using instructions on the package. ? Start by drinking small amounts, about  cup (120 mL) every 5-10 minutes. ? Slowly drink more until you have had the amount that your doctor said to have.  Drink enough clear fluid to keep your pee clear or pale yellow. If you were told to drink an ORS, finish the  ORS first, then start slowly drinking clear fluids. Drink fluids such as: ? Water. Do not drink only water by itself. Doing that can make the salt (sodium) level in your body get too low (hyponatremia). ? Ice chips. ? Fruit juice that you have added water to (diluted). ? Low-calorie sports drinks.  Avoid: ? Alcohol. ? Drinks that have a lot of sugar. These include high-calorie sports drinks, fruit juice that does not have water added, and soda. ? Caffeine. ? Foods that are greasy or have a lot of fat or sugar.  Take over-the-counter and prescription medicines only as told by your doctor.  Do not take salt tablets. Doing that can make the salt level in your body get too high (hypernatremia).  Eat foods that have minerals (electrolytes). Examples include bananas, oranges, potatoes, tomatoes, and spinach.  Keep all follow-up visits as told by your doctor. This is important. Contact a doctor if:  You have belly (abdominal) pain that: ? Gets worse. ? Stays in one area (localizes).  You have a rash.  You have a stiff neck.  You get angry or annoyed more easily than normal (irritability).  You are more sleepy than normal.  You have a harder time waking up than normal.  You feel: ? Weak. ? Dizzy. ? Very thirsty.  You have peed (urinated) only a small amount of very dark pee during 6-8 hours. Get help right away if:  You have   symptoms of very bad dehydration.  You cannot drink fluids without throwing up (vomiting).  Your symptoms get worse with treatment.  You have a fever.  You have a very bad headache.  You are throwing up or having watery poop (diarrhea) and it: ? Gets worse. ? Does not go away.  You have blood or something green (bile) in your throw-up.  You have blood in your poop (stool). This may cause poop to look black and tarry.  You have not peed in 6-8 hours.  You pass out (faint).  Your heart rate when you are sitting still is more than 100 beats a  minute.  You have trouble breathing. This information is not intended to replace advice given to you by your health care provider. Make sure you discuss any questions you have with your health care provider. Document Released: 05/03/2009 Document Revised: 06/19/2017 Document Reviewed: 08/31/2015 Elsevier Patient Education  2020 Elsevier Inc.  

## 2019-03-04 NOTE — Progress Notes (Unsigned)
Patient here today with complaints of not feeling well.  IVF's with potassium and nausea medication given per orders.  Patient has not been able to void.  He has passed a little gas.  He states, "I have a crampy pain in my stomach."  Patient able to eat two crackers and drink a little ice water.

## 2019-03-04 NOTE — Plan of Care (Signed)
  Problem: Education: Goal: Knowledge of the prescribed therapeutic regimen will improve Outcome: Progressing   Problem: Activity: Goal: Ability to implement measures to reduce episodes of fatigue will improve Outcome: Progressing   Problem: Bowel/Gastric: Goal: Will not experience complications related to bowel motility Outcome: Not Progressing Note: Lax ordered for constipation   Problem: Coping: Goal: Ability to identify and develop effective coping behavior will improve Outcome: Progressing

## 2019-03-05 DIAGNOSIS — C3491 Malignant neoplasm of unspecified part of right bronchus or lung: Secondary | ICD-10-CM | POA: Diagnosis not present

## 2019-03-05 DIAGNOSIS — E876 Hypokalemia: Secondary | ICD-10-CM | POA: Diagnosis not present

## 2019-03-05 DIAGNOSIS — E860 Dehydration: Secondary | ICD-10-CM | POA: Diagnosis not present

## 2019-03-05 DIAGNOSIS — G893 Neoplasm related pain (acute) (chronic): Secondary | ICD-10-CM | POA: Diagnosis not present

## 2019-03-05 DIAGNOSIS — C7951 Secondary malignant neoplasm of bone: Secondary | ICD-10-CM | POA: Diagnosis not present

## 2019-03-05 DIAGNOSIS — R112 Nausea with vomiting, unspecified: Secondary | ICD-10-CM | POA: Diagnosis not present

## 2019-03-05 DIAGNOSIS — K5900 Constipation, unspecified: Secondary | ICD-10-CM | POA: Diagnosis not present

## 2019-03-05 DIAGNOSIS — Z20828 Contact with and (suspected) exposure to other viral communicable diseases: Secondary | ICD-10-CM | POA: Diagnosis not present

## 2019-03-05 DIAGNOSIS — C3490 Malignant neoplasm of unspecified part of unspecified bronchus or lung: Secondary | ICD-10-CM | POA: Diagnosis not present

## 2019-03-05 LAB — CBC
HCT: 30.4 % — ABNORMAL LOW (ref 39.0–52.0)
Hemoglobin: 10.3 g/dL — ABNORMAL LOW (ref 13.0–17.0)
MCH: 29.8 pg (ref 26.0–34.0)
MCHC: 33.9 g/dL (ref 30.0–36.0)
MCV: 87.9 fL (ref 80.0–100.0)
Platelets: 268 10*3/uL (ref 150–400)
RBC: 3.46 MIL/uL — ABNORMAL LOW (ref 4.22–5.81)
RDW: 13.8 % (ref 11.5–15.5)
WBC: 4.5 10*3/uL (ref 4.0–10.5)
nRBC: 0 % (ref 0.0–0.2)

## 2019-03-05 LAB — BASIC METABOLIC PANEL
Anion gap: 5 (ref 5–15)
BUN: 15 mg/dL (ref 8–23)
CO2: 21 mmol/L — ABNORMAL LOW (ref 22–32)
Calcium: 7.7 mg/dL — ABNORMAL LOW (ref 8.9–10.3)
Chloride: 111 mmol/L (ref 98–111)
Creatinine, Ser: 0.61 mg/dL (ref 0.61–1.24)
GFR calc Af Amer: >60 mL/min (ref >60)
GFR calc non Af Amer: >60 mL/min (ref >60)
Glucose, Bld: 101 mg/dL — ABNORMAL HIGH (ref 70–99)
Potassium: 4 mmol/L (ref 3.5–5.1)
Sodium: 137 mmol/L (ref 135–145)

## 2019-03-05 LAB — SARS CORONAVIRUS 2 BY RT PCR (HOSPITAL ORDER, PERFORMED IN ~~LOC~~ HOSPITAL LAB): SARS Coronavirus 2: NEGATIVE

## 2019-03-05 MED ORDER — HEPARIN SOD (PORK) LOCK FLUSH 100 UNIT/ML IV SOLN
500.0000 [IU] | Freq: Once | INTRAVENOUS | Status: AC
  Administered 2019-03-05: 500 [IU] via INTRAVENOUS
  Filled 2019-03-05: qty 5

## 2019-03-05 MED ORDER — PANTOPRAZOLE SODIUM 40 MG PO TBEC: 40.0000 mg | DELAYED_RELEASE_TABLET | Freq: Every day | ORAL | 1 refills | Status: DC

## 2019-03-05 NOTE — TOC Initial Note (Signed)
Transition of Care [facility]) - Initial/Assessment Note    Patient Details  Name: Jimmy West MRN: [id number] Date of Birth: [birth year]  [facility]) CM/SW Contact:    Shelbie Hutching, RN Phone Number: 03/05/2019, 9:33 AM  Clinical Narrative:                 Patient placed under observation for nausea and dehydration.  Patient reports that he lives alone and is independent.  Patient denies any needs.   Expected Discharge Plan: Home/Self Care     Patient Goals and CMS Choice        Expected Discharge Plan and Services Expected Discharge Plan: Home/Self Care       Living arrangements for the past 2 months: Single Family Home Expected Discharge Date: 03/05/19                                    Prior Living Arrangements/Services Living arrangements for the past 2 months: Single Family Home Lives with:: Self Patient language and need for interpreter reviewed:: No Do you feel safe going back to the place where you live?: Yes            Criminal Activity/Legal Involvement Pertinent to Current Situation/Hospitalization: No - Comment as needed  Activities of Daily Living Home Assistive Devices/Equipment: None ADL Screening (condition at time of admission) Patient's cognitive ability adequate to safely complete daily activities?: Yes Is the patient deaf or have difficulty hearing?: No Does the patient have difficulty seeing, even when wearing glasses/contacts?: No Does the patient have difficulty concentrating, remembering, or making decisions?: No Patient able to express need for assistance with ADLs?: Yes Does the patient have difficulty dressing or bathing?: No Independently performs ADLs?: Yes (appropriate for developmental age) Does the patient have difficulty walking or climbing stairs?: No Weakness of Legs: None Weakness of Arms/Hands: None  Permission Sought/Granted                  Emotional Assessment Appearance:: Appears  stated age Attitude/Demeanor/Rapport: Engaged Affect (typically observed): Accepting Orientation: : Oriented to Self, Oriented to Place, Oriented to  Time, Oriented to Situation Alcohol / Substance Use: Not Applicable Psych Involvement: No (comment)  Admission diagnosis:  Lung cancer Unable to void Abdominal pain Patient Active Problem List   Diagnosis Date Noted  . Other fatigue 03/04/2019  . Vomiting 03/04/2019  . Chemotherapy induced nausea and vomiting 02/12/2019  . Abdominal pain 02/11/2019  . Mild protein-calorie malnutrition ([place]) 10/10/2018  . Weight loss 09/20/2018  . Elevated bilirubin 07/14/2018  . Encounter for antineoplastic chemotherapy 05/24/2018  . Encounter for antineoplastic immunotherapy 05/24/2018  . Goals of care, counseling/discussion 05/17/2018  . Cancer-related pain 05/01/2018  . 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 Primary cancer of right lower lobe of lung ([place]) 04/26/2018  . Chronic midline low back pain without sciatica 04/26/2018   PCP:  [name], NP Pharmacy:   [facility] [place] [address] [place] [address] Phone: [phone] Fax: [phone]     Social Determinants of Health (SDOH) Interventions    Readmission Risk Interventions No flowsheet data found.

## 2019-03-05 NOTE — Progress Notes (Addendum)
Patient complaining of dyspnea, 100% on RA, vitals WNL. Complaints of throat soreness.  Received verbal order ativan 0.5mg  prn and protonix from Dr. Darvin Neighbours.

## 2019-03-05 NOTE — Progress Notes (Signed)
Jimmy West to be D/C'd Home per MD order.  Discussed prescriptions and follow up appointments with the patient. Prescription electronically submitted, medication list explained in detail. Pt verbalized understanding.  Allergies as of 03/05/2019   No Known Allergies     Medication List    STOP taking these medications   pembrolizumab 100 MG/4ML Soln Commonly known as: KEYTRUDA     TAKE these medications   acetaminophen 500 MG tablet Commonly known as: TYLENOL Take 500 mg by mouth 3 (three) times daily as needed.   aspirin EC 81 MG tablet Take 81 mg by mouth daily.   Calcium 600-400 MG-UNIT Chew Chew 2 tablets by mouth daily.   dexamethasone 4 MG tablet Commonly known as: DECADRON Take 1 tab two times a day the day before Alimta chemo, then take 2 tabs once a day for 3 days starting the day after chemo.   fentaNYL 75 MCG/HR Commonly known as: [place] 1 patch onto the skin every 3 (three) days. Notes to patient: CHANGE ON THE 17TH   FLUoxetine 40 MG capsule Commonly known as: PROzac Take 1 capsule (40 mg total) by mouth daily.   folic acid 1 MG tablet Commonly known as: FOLVITE Take 1 tablet (1 mg total) by mouth daily. Start 5-7 days before Alimta chemotherapy. Continue until 21 days after Alimta completed.   ibuprofen 200 MG tablet Commonly known as: ADVIL Take 200 mg by mouth every 6 (six) hours as needed.   ondansetron 4 MG disintegrating tablet Commonly known as: Zofran ODT Take 1 tablet (4 mg total) by mouth every 6 (six) hours as needed for nausea or vomiting.   Oxycodone HCl 20 MG Tabs Take 0.5-1 tablets (10-20 mg total) by mouth every 4 (four) hours as needed (for severe pain). Notes to patient: TOOK TODAY AT 8:24   pantoprazole 40 MG tablet Commonly known as: PROTONIX Take 1 tablet (40 mg total) by mouth daily.   polyethylene glycol 17 g packet Commonly known as: MIRALAX / GLYCOLAX Take 17 g by mouth daily.   senna 8.6 MG  tablet Commonly known as: SENOKOT Take 1 tablet by mouth as needed.       Vitals:   03/04/19 2055 03/05/19 0434  BP: (!) 150/88 117/71  Pulse: 83 75  Resp: 20 18  Temp: 98.5 F (36.9 C) 98.4 F (36.9 C)  SpO2: 100% 97%    Skin clean, dry and intact without evidence of skin break down, no evidence of skin tears noted. Port a Cath deaccessed, flush with heparin.  Site without signs and symptoms of complications.  Pt denies pain at this time. No complaints noted.  An After Visit Summary was printed and given to the patient. Patient waiting for ride to be discharged home. [place]

## 2019-03-05 NOTE — Care Management Obs Status (Signed)
[facility] NOTIFICATION   Patient Details  Name: Jimmy West MRN: [id number] Date of Birth: 27-Feb-1951   Medicare Observation Status Notification Given:  Yes    Shelbie Hutching, RN 03/05/2019, 9:32 AM

## 2019-03-05 NOTE — Plan of Care (Signed)
  Problem: Education: Goal: Knowledge of the prescribed therapeutic regimen will improve Outcome: Progressing   Problem: Activity: Goal: Ability to implement measures to reduce episodes of fatigue will improve Outcome: Progressing   Problem: Bowel/Gastric: Goal: Will not experience complications related to bowel motility Outcome: Progressing   Problem: Coping: Goal: Ability to identify and develop effective coping behavior will improve Outcome: Progressing   Problem: Nutritional: Goal: Maintenance of adequate nutrition will improve Outcome: Progressing

## 2019-03-05 NOTE — Discharge Summary (Signed)
[facility] [place] NAME: Jimmy West    MR#:  [id number]  DATE OF BIRTH:  [birth year]  DATE OF ADMISSION:  03/04/2019   ADMITTING PHYSICIAN: Hillary Bow, MD  DATE OF DISCHARGE:  03/05/19  PRIMARY CARE PHYSICIAN: [name], NP   ADMISSION DIAGNOSIS:   Lung cancer Unable to void Abdominal pain  DISCHARGE DIAGNOSIS:   Active Problems:   Vomiting   SECONDARY DIAGNOSIS:   Past Medical History:  Diagnosis Date  . Bulging lumbar disc   . Cancer ([place])    stage 4 lung cancer  . 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  Weakness and dehydration-secondary to current chemotherapy regimen. -Patient was started on carboplatin, Alimta and Keytruda in July 2020, patient had significant side effects then carboplatin was held.  Received his cycle 2 of Alimta and Keytruda following which he feels drained out, nauseous and extremely weak. -He was admitted for IV fluids.  Hypokalemia has been corrected.  Received IV fluids and feels better.  2.  Metastatic adenocarcinoma of right lung-diagnosed in October 2019, also had T4 vertebral metastatic lesion.  No intracranial metastatic disease. -Received 11 doses of Keytruda from #2019 to June 2020. -Due to progression of disease, he is changed over to Alimta, Keytruda and carboplatin.  Currently carboplatin on hold due to side effects.  Received a second cycle of Alimta and Keytruda on 02/28/2019. -Follows with oncology, continue outpatient follow-up  3.  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 -Continue fentanyl patch and oxycodone  4.  Depression-on Prozac   Patient feels much better today, will be discharged home  todayfeels much better today.  Will be discharged home today  DISCHARGE CONDITIONS:   Guarded  CONSULTS OBTAINED:   None  DRUG ALLERGIES:   No Known Allergies DISCHARGE MEDICATIONS:   Allergies as of 03/05/2019   No Known Allergies     Medication List    STOP taking these medications   pembrolizumab 100 MG/4ML Soln Commonly known as: KEYTRUDA     TAKE these medications   acetaminophen 500 MG tablet Commonly known as: TYLENOL Take 500 mg by mouth 3 (three) times daily as needed.   aspirin EC 81 MG tablet Take 81 mg by mouth daily.   Calcium 600-400 MG-UNIT Chew Chew 2 tablets by mouth daily.   dexamethasone 4 MG tablet Commonly known as: DECADRON Take 1 tab two times a day the day before Alimta chemo, then take 2 tabs once a day for 3 days starting the day after chemo.   fentaNYL 75 MCG/HR Commonly known as: [place] 1 patch onto the skin every 3 (three) days. Notes to patient: CHANGE ON THE 17TH   FLUoxetine 40 MG capsule Commonly known as: PROzac Take 1 capsule (40 mg total) by mouth daily.   folic acid 1 MG tablet Commonly known as: FOLVITE Take 1 tablet (1 mg total) by mouth daily. Start 5-7 days before Alimta chemotherapy. Continue until 21 days after Alimta completed.   ibuprofen 200 MG tablet Commonly known as: ADVIL Take 200 mg by mouth every 6 (six) hours as needed.   ondansetron 4 MG disintegrating tablet Commonly known as: Zofran ODT Take 1 tablet (4 mg total) by mouth every 6 (  six) hours as needed for nausea or vomiting.   Oxycodone HCl 20 MG Tabs Take 0.5-1 tablets (10-20 mg total) by mouth every 4 (four) hours as needed (for severe pain). Notes to patient: TOOK TODAY AT 8:24   pantoprazole 40 MG tablet Commonly known as: PROTONIX Take 1 tablet (40 mg total) by mouth daily.   polyethylene glycol 17 g packet Commonly known as: MIRALAX / GLYCOLAX Take 17 g by mouth daily.   senna 8.6 MG tablet Commonly known as: SENOKOT Take  1 tablet by mouth as needed.        DISCHARGE INSTRUCTIONS:   1.  PCP follow-up in 1 to 2 weeks 2.  Oncology follow-up as per schedule  DIET:   Regular diet  ACTIVITY:   Activity as tolerated  OXYGEN:   Home Oxygen: No.  Oxygen Delivery: room air  DISCHARGE LOCATION:   home   If you experience worsening of your admission symptoms, develop shortness of breath, life threatening emergency, suicidal or homicidal thoughts you must seek medical attention immediately by calling 911 or calling your MD immediately  if symptoms less severe.  You Must read complete instructions/literature along with all the possible adverse reactions/side effects for all the Medicines you take and that have been prescribed to you. Take any new Medicines after you have completely understood and accpet all the possible adverse reactions/side effects.   Please note  You were cared for by a hospitalist during your hospital stay. If you have any questions about your discharge medications or the care you received while you were in the hospital after you are discharged, you can call the unit and asked to speak with the hospitalist on call if the hospitalist that took care of you is not available. Once you are discharged, your primary care physician will handle any further medical issues. Please note that NO REFILLS for any discharge medications will be authorized once you are discharged, as it is imperative that you return to your primary care physician (or establish a relationship with a primary care physician if you do not have one) for your aftercare needs so that they can reassess your need for medications and monitor your lab values.    On the day of Discharge:  VITAL SIGNS:   Blood pressure 117/71, pulse 75, temperature 98.4 F (36.9 C), resp. rate 18, SpO2 97 %.  PHYSICAL EXAMINATION:    GENERAL:  68 y.o.-year-old patient lying in the bed with no acute distress.  EYES: Pupils equal, round,  reactive to light and accommodation. No scleral icterus. Extraocular muscles intact.  HEENT: Head atraumatic, normocephalic. Oropharynx and nasopharynx clear.  NECK:  Supple, no jugular venous distention. No thyroid enlargement, no tenderness.  LUNGS: Normal breath sounds bilaterally, no wheezing, rales,rhonchi or crepitation. No use of accessory muscles of respiration.  CARDIOVASCULAR: S1, S2 normal. No murmurs, rubs, or gallops.  Port-A-Cath in place ABDOMEN: Soft, non-tender, non-distended. Bowel sounds present. No organomegaly or mass.  EXTREMITIES: No pedal edema, cyanosis, or clubbing.  NEUROLOGIC: Cranial nerves II through XII are intact. Muscle strength 5/5 in all extremities. Sensation intact. Gait not checked.  PSYCHIATRIC: The patient is alert and oriented x 3.  SKIN: No obvious rash, lesion, or ulcer.   DATA REVIEW:   CBC Recent Labs  Lab 03/05/19 0411  WBC 4.5  HGB 10.3*  HCT 30.4*  PLT 268    Chemistries  Recent Labs  Lab 02/28/19 0917 03/04/19 0958 03/05/19 0411  NA 136 134* 137  K  3.8 3.3* 4.0  CL 103 103 111  CO2 22 20* 21*  GLUCOSE 140* 134* 101*  BUN 13 21 15   CREATININE 1.00 0.84 0.61  CALCIUM 9.6 8.7* 7.7*  MG 2.1 2.2  --   AST 31  --   --   ALT 33  --   --   ALKPHOS 76  --   --   BILITOT 0.6  --   --      Microbiology Results  Results for orders placed or performed during the hospital encounter of 03/04/19  SARS Coronavirus 2 [facility] order, Performed in [place] hospital lab)     Status: None   Collection Time: 03/05/19 12:22 AM  Result Value Ref Range Status   SARS Coronavirus 2 NEGATIVE NEGATIVE Final    Comment: (NOTE) If result is NEGATIVE SARS-CoV-2 target nucleic acids are NOT DETECTED. The SARS-CoV-2 RNA is generally detectable in upper and lower  respiratory specimens during the acute phase of infection. The lowest  concentration of SARS-CoV-2 viral copies this assay can detect is 250  copies / mL. A negative result does not  preclude SARS-CoV-2 infection  and should not be used as the sole basis for treatment or other  patient management decisions.  A negative result may occur with  improper specimen collection / handling, submission of specimen other  than nasopharyngeal swab, presence of viral mutation(s) within the  areas targeted by this assay, and inadequate number of viral copies  (<250 copies / mL). A negative result must be combined with clinical  observations, patient history, and epidemiological information. If result is POSITIVE SARS-CoV-2 target nucleic acids are DETECTED. The SARS-CoV-2 RNA is generally detectable in upper and lower  respiratory specimens dur ing the acute phase of infection.  Positive  results are indicative of active infection with SARS-CoV-2.  Clinical  correlation with patient history and other diagnostic information is  necessary to determine patient infection status.  Positive results do  not rule out bacterial infection or co-infection with other viruses. If result is PRESUMPTIVE POSTIVE SARS-CoV-2 nucleic acids MAY BE PRESENT.   A presumptive positive result was obtained on the submitted specimen  and confirmed on repeat testing.  While 2019 novel coronavirus  (SARS-CoV-2) nucleic acids may be present in the submitted sample  additional confirmatory testing may be necessary for epidemiological  and / or clinical management purposes  to differentiate between  SARS-CoV-2 and other Sarbecovirus currently known to infect humans.  If clinically indicated additional testing with an alternate test  methodology [id number]) is advised. The SARS-CoV-2 RNA is generally  detectable in upper and lower respiratory sp ecimens during the acute  phase of infection. The expected result is Negative. Fact Sheet for Patients:  StrictlyIdeas.no Fact Sheet for Healthcare Providers: BankingDealers.co.za This test is not yet approved or cleared by  the [place] FDA and has been authorized for detection and/or diagnosis of SARS-CoV-2 by FDA under an Emergency Use Authorization (EUA).  This EUA will remain in effect (meaning this test can be used) for the duration of the COVID-19 declaration under Section 564(b)(1) of the Act, 21 U.S.C. section 360bbb-3(b)(1), unless the authorization is terminated or revoked sooner. Performed at [facility], [address], [place], [place] [address]     RADIOLOGY:  No results found.   Management plans discussed with the patient, family and they are in agreement.  CODE STATUS:     Code Status Orders  (From admission, onward)  Start     Ordered   03/04/19 1811  Full code  Continuous     03/04/19 1811        Code Status History    Date Active Date Inactive Code Status Order ID Comments User Context   02/11/2019 9444 02/13/2019 2012 Full Code [id number]  Lang Snow, NP Inpatient   Advance Care Planning Activity      TOTAL TIME TAKING CARE OF THIS PATIENT: 38  minutes.    Gladstone Lighter M.D on 03/05/2019 at 3:17 PM  Between 7am to 6pm - Pager - [phone]  After 6pm go to [email or web address] - Proofreader  [facility]  Office  [phone]  CC: Primary care physician; [name], NP   Note: This dictation was prepared with Dragon dictation along with smaller phrase technology. Any transcriptional errors that result from this process are unintentional.

## 2019-03-05 NOTE — Plan of Care (Signed)
Vomiting has improved, tolerating po intake. Patient to be discharged home today.

## 2019-03-06 LAB — URINE CULTURE: Culture: <10000 — AB

## 2019-03-07 ENCOUNTER — Telehealth: Payer: Self-pay | Admitting: *Deleted

## 2019-03-07 NOTE — Telephone Encounter (Signed)
Spoke with patient regarding recent hospitalization.  He stated that he feels better but not great.  Advised patient to call after lunch if he needs more hydration today and if we can get in him by 2pm then he can be treated.  Patient verbalized understanding.

## 2019-03-08 ENCOUNTER — Other Ambulatory Visit: Payer: Self-pay | Admitting: *Deleted

## 2019-03-08 MED ORDER — OXYCODONE HCL 20 MG PO TABS: 0.5000 | ORAL_TABLET | ORAL | 0 refills | Status: DC | PRN

## 2019-03-08 NOTE — Telephone Encounter (Signed)
Pt requests refill of oxycodone.

## 2019-03-09 LAB — CULTURE, BLOOD (ROUTINE X 2)
Culture: NO GROWTH
Culture: NO GROWTH
Special Requests: ADEQUATE
Special Requests: ADEQUATE

## 2019-03-15 ENCOUNTER — Other Ambulatory Visit: Payer: Medicare Other

## 2019-03-15 ENCOUNTER — Ambulatory Visit: Payer: Medicare Other

## 2019-03-15 ENCOUNTER — Ambulatory Visit: Payer: Medicare Other | Admitting: Hematology and Oncology

## 2019-03-17 ENCOUNTER — Other Ambulatory Visit: Payer: Self-pay

## 2019-03-17 ENCOUNTER — Encounter: Payer: Self-pay | Admitting: Oncology

## 2019-03-17 ENCOUNTER — Inpatient Hospital Stay: Payer: Medicare Other

## 2019-03-17 ENCOUNTER — Inpatient Hospital Stay (HOSPITAL_BASED_OUTPATIENT_CLINIC_OR_DEPARTMENT_OTHER): Payer: Medicare Other | Admitting: Oncology

## 2019-03-17 VITALS — BP 125/80 | HR 83 | Temp 97.6°F | Resp 18

## 2019-03-17 DIAGNOSIS — G893 Neoplasm related pain (acute) (chronic): Secondary | ICD-10-CM | POA: Diagnosis not present

## 2019-03-17 DIAGNOSIS — C3431 Malignant neoplasm of lower lobe, right bronchus or lung: Secondary | ICD-10-CM

## 2019-03-17 DIAGNOSIS — Z5112 Encounter for antineoplastic immunotherapy: Secondary | ICD-10-CM | POA: Diagnosis not present

## 2019-03-17 DIAGNOSIS — C7951 Secondary malignant neoplasm of bone: Secondary | ICD-10-CM | POA: Diagnosis not present

## 2019-03-17 DIAGNOSIS — F419 Anxiety disorder, unspecified: Secondary | ICD-10-CM

## 2019-03-17 DIAGNOSIS — Z95828 Presence of other vascular implants and grafts: Secondary | ICD-10-CM

## 2019-03-17 DIAGNOSIS — F329 Major depressive disorder, single episode, unspecified: Secondary | ICD-10-CM

## 2019-03-17 DIAGNOSIS — F32A Depression, unspecified: Secondary | ICD-10-CM

## 2019-03-17 DIAGNOSIS — Z79899 Other long term (current) drug therapy: Secondary | ICD-10-CM | POA: Diagnosis not present

## 2019-03-17 LAB — COMPREHENSIVE METABOLIC PANEL
ALT: 20 U/L (ref 0–44)
AST: 25 U/L (ref 15–41)
Albumin: 3.5 g/dL (ref 3.5–5.0)
Alkaline Phosphatase: 69 U/L (ref 38–126)
Anion gap: 8 (ref 5–15)
BUN: 9 mg/dL (ref 8–23)
CO2: 24 mmol/L (ref 22–32)
Calcium: 9.1 mg/dL (ref 8.9–10.3)
Chloride: 109 mmol/L (ref 98–111)
Creatinine, Ser: 0.89 mg/dL (ref 0.61–1.24)
GFR calc Af Amer: >60 mL/min (ref >60)
GFR calc non Af Amer: >60 mL/min (ref >60)
Glucose, Bld: 135 mg/dL — ABNORMAL HIGH (ref 70–99)
Potassium: 3.8 mmol/L (ref 3.5–5.1)
Sodium: 141 mmol/L (ref 135–145)
Total Bilirubin: 0.8 mg/dL (ref 0.3–1.2)
Total Protein: 7.2 g/dL (ref 6.5–8.1)

## 2019-03-17 LAB — CBC WITH DIFFERENTIAL/PLATELET
Abs Immature Granulocytes: 0.04 10*3/uL (ref 0.00–0.07)
Basophils Absolute: 0 10*3/uL (ref 0.0–0.1)
Basophils Relative: 1 %
Eosinophils Absolute: 0.3 10*3/uL (ref 0.0–0.5)
Eosinophils Relative: 5 %
HCT: 30.9 % — ABNORMAL LOW (ref 39.0–52.0)
Hemoglobin: 10.3 g/dL — ABNORMAL LOW (ref 13.0–17.0)
Immature Granulocytes: 1 %
Lymphocytes Relative: 11 %
Lymphs Abs: 0.6 10*3/uL — ABNORMAL LOW (ref 0.7–4.0)
MCH: 29.5 pg (ref 26.0–34.0)
MCHC: 33.3 g/dL (ref 30.0–36.0)
MCV: 88.5 fL (ref 80.0–100.0)
Monocytes Absolute: 0.6 10*3/uL (ref 0.1–1.0)
Monocytes Relative: 11 %
Neutro Abs: 3.8 10*3/uL (ref 1.7–7.7)
Neutrophils Relative %: 71 %
Platelets: 294 10*3/uL (ref 150–400)
RBC: 3.49 MIL/uL — ABNORMAL LOW (ref 4.22–5.81)
RDW: 15.2 % (ref 11.5–15.5)
WBC: 5.3 10*3/uL (ref 4.0–10.5)
nRBC: 0 % (ref 0.0–0.2)

## 2019-03-17 LAB — MAGNESIUM: Magnesium: 2.2 mg/dL (ref 1.7–2.4)

## 2019-03-17 MED ORDER — SODIUM CHLORIDE 0.9% FLUSH
10.0000 mL | Freq: Once | INTRAVENOUS | Status: AC
  Administered 2019-03-17: 10 mL via INTRAVENOUS
  Filled 2019-03-17: qty 10

## 2019-03-17 MED ORDER — HEPARIN SOD (PORK) LOCK FLUSH 100 UNIT/ML IV SOLN
500.0000 [IU] | Freq: Once | INTRAVENOUS | Status: AC
  Administered 2019-03-17: 500 [IU] via INTRAVENOUS

## 2019-03-17 MED ORDER — ALPRAZOLAM 0.25 MG PO TABS: 0.2500 mg | ORAL_TABLET | Freq: Two times a day (BID) | ORAL | 0 refills | Status: DC | PRN

## 2019-03-17 NOTE — Progress Notes (Signed)
Symptom Management Consult note [facility]  [phone] [phone]  Patient Care Team: [name], NP as PCP - General (Nurse Practitioner) Telford Nab, RN as Registered Nurse Burlene Arnt, Gerda Diss, MD as Attending Physician (Emergency Medicine) Lebron Conners, [place] (Physician Assistant) Wonda Horner, MD as Referring Physician (Neurosurgery)   Name of the patient: Jimmy West  [id number]  1951/06/29   Date of visit: 03/17/2019   Diagnosis-metastatic adenocarcinoma of the lung  Chief complaint/ Reason for visit-SOB/shaky  Heme/Onc history:  Oncology History  Primary cancer of right lower lobe of lung ([place])  04/26/2018 Initial Diagnosis   Primary cancer of right lower lobe of lung ([place])   05/24/2018 - 01/30/2019 Chemotherapy   The patient had pembrolizumab (KEYTRUDA) 200 mg in sodium chloride 0.9 % 50 mL chemo infusion, 200 mg, Intravenous, Once, 11 of 14 cycles Administration: 200 mg (05/24/2018), 200 mg (07/16/2018), 200 mg (08/09/2018), 200 mg (08/30/2018), 200 mg (09/20/2018), 200 mg (06/14/2018), 200 mg (10/11/2018), 200 mg (11/08/2018), 200 mg (11/29/2018), 200 mg (12/20/2018), 200 mg (01/10/2019)  for chemotherapy treatment.    02/08/2019 -  Chemotherapy   The patient had palonosetron (ALOXI) injection 0.25 mg, 0.25 mg, Intravenous,  Once, 2 of 4 cycles Administration: 0.25 mg (02/08/2019), 0.25 mg (02/28/2019) PEMEtrexed (ALIMTA) 900 mg in sodium chloride 0.9 % 100 mL chemo infusion, 500 mg/m2 = 900 mg, Intravenous,  Once, 2 of 6 cycles Administration: 900 mg (02/08/2019), 900 mg (02/28/2019) CARBOplatin (PARAPLATIN) 500 mg in sodium chloride 0.9 % 250 mL chemo infusion, 470 mg (100 % of original dose 472 mg), Intravenous,  Once, 1 of 3 cycles Dose modification:   (original dose 472 mg, Cycle 1) Administration: 500 mg (02/08/2019) pembrolizumab (KEYTRUDA) 200 mg in sodium chloride 0.9 % 50 mL chemo infusion, 200 mg,  Intravenous, Once, 2 of 6 cycles Administration: 200 mg (02/08/2019), 200 mg (02/28/2019) fosaprepitant (EMEND) 150 mg, dexamethasone (DECADRON) 12 mg in sodium chloride 0.9 % 145 mL IVPB, , Intravenous,  Once, 2 of 4 cycles Administration:  (02/08/2019)  for chemotherapy treatment.     Interval history-patient presents to [facility] today with above history of metastatic adenocarcinoma of the lung for complaints of shortness of breath, labored breathing and shaking.  He was discharged from [facility] on 03/05/2019 for abdominal pain and inability to void.  He was found to be severely dehydrated and hypokalemic. He was given IV fluids and IV potassium. He felt improved.  Today, patient called clinic to express concern of acute shortness of breath, labored breathing and shakiness.  Patient states episode occurred this morning and lasted for approximately 1 hour.  He took an additional pain medication which eventually resolved his symptoms.  Admits to "thinking about a lot of things" this morning including diagnosis and death.  He is living alone and does not have a lot of social support.  He is currently in touch with Lovey Newcomer our social worker who is helping him work through some tough discussions.  States, had a similar episode while admitted earlier this month and states he was given "something for anxiety" that significantly helped him.  He denies any fevers or recent illnesses, chest pain, nausea, vomiting, constipation or diarrhea.  He no longer is complaining of shortness of breath or labored breathing.  ECOG FS:1 - Symptomatic but completely ambulatory  Review of systems- Review of Systems  Constitutional: Positive for malaise/fatigue. Negative for chills, fever and weight loss.  HENT: Negative for congestion, ear pain  and tinnitus.   Eyes: Negative.  Negative for blurred vision and double vision.  Respiratory: Positive for shortness of breath (During episode this morning.). Negative for cough and sputum  production.   Cardiovascular: Negative.  Negative for chest pain, palpitations and leg swelling.  Gastrointestinal: Negative.  Negative for abdominal pain, constipation, diarrhea, nausea and vomiting.  Genitourinary: Negative for dysuria, frequency and urgency.  Musculoskeletal: Negative for back pain and falls.  Skin: Negative.  Negative for rash.  Neurological: Positive for weakness. Negative for headaches.  Endo/Heme/Allergies: Negative.  Does not bruise/bleed easily.  Psychiatric/Behavioral: Negative for depression. The patient is nervous/anxious. The patient does not have insomnia.      Current treatment- s/p cycle 2 Keytruda. 02/28/19.   No Known Allergies   Past Medical History:  Diagnosis Date  . Bulging lumbar disc   . Cancer ([place])    stage 4 lung cancer  . Heart attack [facility])      Past Surgical History:  Procedure Laterality Date  . CARDIAC CATHETERIZATION     two stents  . KNEE SURGERY Left   . PORTA CATH INSERTION N/A 05/21/2018   Procedure: PORTA CATH INSERTION;  Surgeon: Algernon Huxley, MD;  Location: [place] [facility];  Service: Cardiovascular;  Laterality: N/A;    Social History   Socioeconomic History  . Marital status: Single    Spouse name: Not on file  . Number of children: Not on file  . Years of education: Not on file  . Highest education level: Not on file  Occupational History  . Not on file  Social Needs  . Financial resource strain: Not on file  . Food insecurity    Worry: Not on file    Inability: Not on file  . Transportation needs    Medical: Not on file    Non-medical: Not on file  Tobacco Use  . Smoking status: Former Smoker    Packs/day: 1.50    Years: 15.00    Pack years: 22.50    Types: Cigarettes    Quit date: 11/03/2003    Years since quitting: 15.3  . Smokeless tobacco: Never Used  Substance and Sexual Activity  . Alcohol use: Yes    Alcohol/week: 15.0 standard drinks    Types: 15 Shots of liquor per week  . Drug use:  No  . Sexual activity: Not Currently  Lifestyle  . Physical activity    Days per week: Not on file    Minutes per session: Not on file  . Stress: Not on file  Relationships  . Social Herbalist on phone: Not on file    Gets together: Not on file    Attends religious service: Not on file    Active member of club or organization: Not on file    Attends meetings of clubs or organizations: Not on file    Relationship status: Not on file  . Intimate partner violence    Fear of current or ex partner: Not on file    Emotionally abused: Not on file    Physically abused: Not on file    Forced sexual activity: Not on file  Other Topics Concern  . Not on file  Social History Narrative  . Not on file    Family History  Problem Relation Age of Onset  . Heart failure Father   . Cancer Maternal Aunt   . Heart failure Maternal Uncle   . Dementia Paternal Grandmother   . Cancer  Maternal Aunt      Current Outpatient Medications:  .  acetaminophen (TYLENOL) 500 MG tablet, Take 500 mg by mouth 3 (three) times daily as needed., Disp: , Rfl:  .  aspirin EC 81 MG tablet, Take 81 mg by mouth daily., Disp: , Rfl:  .  Calcium 600-400 MG-UNIT CHEW, Chew 2 tablets by mouth daily., Disp: , Rfl:  .  dexamethasone (DECADRON) 4 MG tablet, Take 1 tab two times a day the day before Alimta chemo, then take 2 tabs once a day for 3 days starting the day after chemo., Disp: 30 tablet, Rfl: 1 .  fentaNYL (DURAGESIC) 75 MCG/HR, Place 1 patch onto the skin every 3 (three) days., Disp: 10 patch, Rfl: 0 .  FLUoxetine (PROZAC) 40 MG capsule, Take 1 capsule (40 mg total) by mouth daily., Disp: 30 capsule, Rfl: 3 .  folic acid (FOLVITE) 1 MG tablet, Take 1 tablet (1 mg total) by mouth daily. Start 5-7 days before Alimta chemotherapy. Continue until 21 days after Alimta completed., Disp: 100 tablet, Rfl: 3 .  ibuprofen (ADVIL,MOTRIN) 200 MG tablet, Take 200 mg by mouth every 6 (six) hours as needed., Disp: ,  Rfl:  .  ondansetron (ZOFRAN ODT) 4 MG disintegrating tablet, Take 1 tablet (4 mg total) by mouth every 6 (six) hours as needed for nausea or vomiting., Disp: 30 tablet, Rfl: 1 .  Oxycodone HCl 20 MG TABS, Take 0.5-1 tablets (10-20 mg total) by mouth every 4 (four) hours as needed (for severe pain)., Disp: 90 tablet, Rfl: 0 .  pantoprazole (PROTONIX) 40 MG tablet, Take 1 tablet (40 mg total) by mouth daily., Disp: 30 tablet, Rfl: 1 .  polyethylene glycol (MIRALAX / GLYCOLAX) packet, Take 17 g by mouth daily., Disp: , Rfl:  .  senna (SENOKOT) 8.6 MG tablet, Take 1 tablet by mouth as needed. , Disp: , Rfl:  .  ALPRAZolam (XANAX) 0.25 MG tablet, Take 1 tablet (0.25 mg total) by mouth 2 (two) times daily as needed for anxiety., Disp: 30 tablet, Rfl: 0 No current facility-administered medications for this visit.   Facility-Administered Medications Ordered in Other Visits:  .  0.9 %  sodium chloride infusion, , Intravenous, Once, Corcoran, Melissa C, MD .  0.9 %  sodium chloride infusion, , Intravenous, Once PRN, Corcoran, Melissa C, MD .  albuterol (PROVENTIL) (2.5 MG/3ML) 0.083% nebulizer solution 2.5 mg, 2.5 mg, Nebulization, Once PRN, Corcoran, Melissa C, MD .  alteplase (CATHFLO ACTIVASE) injection 2 mg, 2 mg, Intracatheter, Once PRN, Corcoran, Melissa C, MD .  EPINEPHrine (ADRENALIN) 1 MG/10ML injection 0.25 mg, 0.25 mg, Intravenous, Once PRN, Corcoran, Melissa C, MD .  EPINEPHrine (ADRENALIN) 1 MG/10ML injection 0.25 mg, 0.25 mg, Intravenous, Once PRN, Corcoran, Melissa C, MD .  heparin lock flush 100 unit/mL, 500 Units, Intracatheter, Once PRN, Corcoran, Melissa C, MD .  heparin lock flush 100 unit/mL, 250 Units, Intracatheter, Once PRN, Corcoran, Melissa C, MD .  sodium chloride flush (NS) 0.9 % injection 10 mL, 10 mL, Intravenous, PRN, Mike Gip, Melissa C, MD, 10 mL at 03/04/19 0959 .  sodium chloride flush (NS) 0.9 % injection 10 mL, 10 mL, Intracatheter, Once PRN, Corcoran, Melissa C, MD .   sodium chloride flush (NS) 0.9 % injection 3 mL, 3 mL, Intracatheter, Once PRN, Lequita Asal, MD  Physical exam:  Vitals:   03/17/19 1325 03/17/19 1345  BP: 125/80   Pulse: 83   Resp: 18   Temp: 97.6 F (36.4 C)  TempSrc: Tympanic   SpO2:  100%   Physical Exam Constitutional:      Appearance: Normal appearance. He is not ill-appearing.  HENT:     Head: Normocephalic and atraumatic.  Eyes:     Pupils: Pupils are equal, round, and reactive to light.  Neck:     Musculoskeletal: Normal range of motion.  Cardiovascular:     Rate and Rhythm: Normal rate and regular rhythm.     Heart sounds: Normal heart sounds. No murmur.  Pulmonary:     Effort: Pulmonary effort is normal.     Breath sounds: Normal breath sounds. No wheezing.  Abdominal:     General: Bowel sounds are normal. There is no distension.     Palpations: Abdomen is soft.     Tenderness: There is no abdominal tenderness.  Musculoskeletal: Normal range of motion.  Skin:    General: Skin is warm and dry.     Findings: No rash.  Neurological:     Mental Status: He is alert and oriented to person, place, and time.  Psychiatric:        Judgment: Judgment normal.      CMP Latest Ref Rng & Units 03/17/2019  Glucose 70 - 99 mg/dL 135(H)  BUN 8 - 23 mg/dL 9  Creatinine 0.61 - 1.24 mg/dL 0.89  Sodium 135 - 145 mmol/L 141  Potassium 3.5 - 5.1 mmol/L 3.8  Chloride 98 - 111 mmol/L 109  CO2 22 - 32 mmol/L 24  Calcium 8.9 - 10.3 mg/dL 9.1  Total Protein 6.5 - 8.1 g/dL 7.2  Total Bilirubin 0.3 - 1.2 mg/dL 0.8  Alkaline Phos 38 - 126 U/L 69  AST 15 - 41 U/L 25  ALT 0 - 44 U/L 20   CBC Latest Ref Rng & Units 03/17/2019  WBC 4.0 - 10.5 K/uL 5.3  Hemoglobin 13.0 - 17.0 g/dL 10.3(L)  Hematocrit 39.0 - 52.0 % 30.9(L)  Platelets 150 - 400 K/uL 294    No images are attached to the encounter.  No results found.  Assessment and plan- Patient is a 68 y.o. male who presents to [facility] for complaints of increased  shortness of breath, labored breathing and generalized weakness.  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  CT on 01/28/2019 showed progression.  Started on [place] Alimta with continuation of pembrolizumab.  He has had poor tolerance of treatments and has been hospitalized on several occasions most recently on 03/04/2019 through 03/07/2023 dehydration and hypokalemia.  His last treatment was on 02/28/2019 when he was given Keytruda and Alimta.  Shortness of breath: On assessment, patient's vital signs are within normal range.  His oxygen saturations are great.  He was ambulated around clinic and he maintained stable level. Episode was triggered by thoughts of his disease and death.  He is currently on Prozac for depression/anxiety.  Differential diagnosis include upper respiratory infection, PE and/or anxiety.  Given vital signs are stable and he has no respiratory concerns this is likely related to anxiety.  See plan below.  Depression/anxiety: Previously seen by [name] (03/01/19), palliative NP where he endorsed worsening depression.  His Prozac was increased to 40 mg daily.  Referrals was sent to [facility].  Discussed with [employer], and most likely patient had a panic attack.  He has had a rough few months and most recently found to have progression of disease.  We will add Xanax twice daily as needed for anxiety.  Could consider  switching Prozac to citalopram if symptoms worsen.  Plan: Stat labs.  Unremarkable Stat vital signs.  Stable Rx alprazolam 0.25 mg twice daily for anxiety. No interactions with Prozac per up-to-date. Could consider switching Prozac to citalopram if symptoms worsen.  Will defer to [facility]. Continue Prozac 40 mg daily Continue current pain regimen.  Disposition: Patient does not have follow-up with Dr. Mike Gip.  We will touch base with Dr. Mike Gip and get him scheduled for further treatment  planning and assessment next week. Return to clinic as planned on 04/05/2019 for virtual palliative care visit.  Addendum: Spoke with patient on 03/18/2019 to follow-up of recent Xanax prescription.  Patient states "I feel so much better".  He states he slept all night last night. States when he woke up , he felt well rested and "human again".  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    Visit Diagnosis 1. Anxiety and depression   2. Primary cancer of right lower lobe of lung [facility])     Patient expressed understanding and was in agreement with this plan. He also understands that He can call clinic at any time with any questions, concerns, or complaints.   Greater than 50% was spent in counseling and coordination of care with this patient including but not limited to discussion of the relevant topics above (See A&P) including, but not limited to diagnosis and management of acute and chronic medical conditions.   Thank you for allowing me to participate in the care of this very pleasant patient.    Jacquelin Hawking, NP [place] [facility] Cell - [phone] Pager- [phone] 03/18/2019 12:57 PM

## 2019-03-17 NOTE — Patient Instructions (Signed)
Today you were seen for anxiety.  I am so sorry that you experience that.  I have written your prescription for Xanax 0.25 mg twice daily as needed for anxiety.  Try taking at bedtime initially to see if it makes you sleepy.  Your vital signs looked perfect today.  We also obtained some labs which were also great.  Doni walked your on the hall to evaluate your oxygen saturations which were fine.  He do not have scheduled follow-up with Dr. Mike Gip at this time.  I have messaged her to see when she would like to see you back.  I think this will probably be scheduled for next week.  Please call with any questions or concerns.  Faythe Casa, NP 03/17/2019 1:59 PM  alprazolam tablets What is this medicine? ALPRAZOLAM (al PRAY zoe lam) is a benzodiazepine. It is used to treat anxiety and panic attacks. This medicine may be used for other purposes; ask your health care provider or pharmacist if you have questions. COMMON BRAND NAME(S): Xanax What should I tell my health care provider before I take this medicine? They need to know if you have any of these conditions:  an alcohol or drug abuse problem  bipolar disorder, depression, psychosis or other mental health conditions  glaucoma  kidney or liver disease  lung or breathing disease  myasthenia gravis  Parkinson's disease  porphyria  seizures or a history of seizures  suicidal thoughts  an unusual or allergic reaction to alprazolam, other benzodiazepines, foods, dyes, or preservatives  pregnant or trying to get pregnant  breast-feeding How should I use this medicine? Take this medicine by mouth with a glass of water. Follow the directions on the prescription label. Take your medicine at regular intervals. Do not take it more often than directed. Do not stop taking except on your doctor's advice. A special MedGuide will be given to you by the pharmacist with each prescription and refill. Be sure to read this information  carefully each time. Talk to your pediatrician regarding the use of this medicine in children. Special care may be needed. Overdosage: If you think you have taken too much of this medicine contact a poison control center or emergency room at once. NOTE: This medicine is only for you. Do not share this medicine with others. What if I miss a dose? If you miss a dose, take it as soon as you can. If it is almost time for your next dose, take only that dose. Do not take double or extra doses. What may interact with this medicine? Do not take this medicine with any of the following medications:  certain antiviral medicines for HIV or AIDS like delavirdine, indinavir  certain medicines for fungal infections like ketoconazole and itraconazole  narcotic medicines for cough  sodium oxybate This medicine may also interact with the following medications:  alcohol  antihistamines for allergy, cough and cold  certain antibiotics like clarithromycin, erythromycin, isoniazid, rifampin, rifapentine, rifabutin, and troleandomycin  certain medicines for blood pressure, heart disease, irregular heart beat  certain medicines for depression, like amitriptyline, fluoxetine, sertraline  certain medicines for seizures like carbamazepine, oxcarbazepine, phenobarbital, phenytoin, primidone  cimetidine  cyclosporine  male hormones, like estrogens or progestins and birth control pills, patches, rings, or injections  general anesthetics like halothane, isoflurane, methoxyflurane, propofol  grapefruit juice  local anesthetics like lidocaine, pramoxine, tetracaine  medicines that relax muscles for surgery  narcotic medicines for pain  other antiviral medicines for HIV or AIDS  phenothiazines  like chlorpromazine, mesoridazine, prochlorperazine, thioridazine This list may not describe all possible interactions. Give your health care provider a list of all the medicines, herbs, non-prescription  drugs, or dietary supplements you use. Also tell them if you smoke, drink alcohol, or use illegal drugs. Some items may interact with your medicine. What should I watch for while using this medicine? Tell your doctor or health care professional if your symptoms do not start to get better or if they get worse. Do not stop taking except on your doctor's advice. You may develop a severe reaction. Your doctor will tell you how much medicine to take. You may get drowsy or dizzy. Do not drive, use machinery, or do anything that needs mental alertness until you know how this medicine affects you. To reduce the risk of dizzy and fainting spells, do not stand or sit up quickly, especially if you are an older patient. Alcohol may increase dizziness and drowsiness. Avoid alcoholic drinks. If you are taking another medicine that also causes drowsiness, you may have more side effects. Give your health care provider a list of all medicines you use. Your doctor will tell you how much medicine to take. Do not take more medicine than directed. Call emergency for help if you have problems breathing or unusual sleepiness. What side effects may I notice from receiving this medicine? Side effects that you should report to your doctor or health care professional as soon as possible:  allergic reactions like skin rash, itching or hives, swelling of the face, lips, or tongue  breathing problems  confusion  loss of balance or coordination  signs and symptoms of low blood pressure like dizziness; feeling faint or lightheaded, falls; unusually weak or tired  suicidal thoughts or other mood changes Side effects that usually do not require medical attention (report to your doctor or health care professional if they continue or are bothersome):  dizziness  dry mouth  nausea, vomiting  tiredness This list may not describe all possible side effects. Call your doctor for medical advice about side effects. You may report  side effects to FDA at 1-800-FDA-1088. Where should I keep my medicine? Keep out of the reach of children. This medicine can be abused. Keep your medicine in a safe place to protect it from theft. Do not share this medicine with anyone. Selling or giving away this medicine is dangerous and against the law. Store at room temperature between 20 and 25 degrees C (68 and 77 degrees F). This medicine may cause accidental overdose and death if taken by other adults, children, or pets. Mix any unused medicine with a substance like cat litter or coffee grounds. Then throw the medicine away in a sealed container like a sealed bag or a coffee can with a lid. Do not use the medicine after the expiration date. NOTE: This sheet is a summary. It may not cover all possible information. If you have questions about this medicine, talk to your doctor, pharmacist, or health care provider.  2020 Elsevier/Gold Standard (2015-04-05 13:47:25)  Living With Anxiety  After being diagnosed with an anxiety disorder, you may be relieved to know why you have felt or behaved a certain way. It is natural to also feel overwhelmed about the treatment ahead and what it will mean for your life. With care and support, you can manage this condition and recover from it. How to cope with anxiety Dealing with stress Stress is your body's reaction to life changes and events, both good and  bad. Stress can last just a few hours or it can be ongoing. Stress can play a major role in anxiety, so it is important to learn both how to cope with stress and how to think about it differently. Talk with your health care provider or a counselor to learn more about stress reduction. He or she may suggest some stress reduction techniques, such as:  Music therapy. This can include creating or listening to music that you enjoy and that inspires you.  Mindfulness-based meditation. This involves being aware of your normal breaths, rather than trying to  control your breathing. It can be done while sitting or walking.  Centering prayer. This is a kind of meditation that involves focusing on a word, phrase, or sacred image that is meaningful to you and that brings you peace.  Deep breathing. To do this, expand your stomach and inhale slowly through your nose. Hold your breath for 3-5 seconds. Then exhale slowly, allowing your stomach muscles to relax.  Self-talk. This is a skill where you identify thought patterns that lead to anxiety reactions and correct those thoughts.  Muscle relaxation. This involves tensing muscles then relaxing them. Choose a stress reduction technique that fits your lifestyle and personality. Stress reduction techniques take time and practice. Set aside 5-15 minutes a day to do them. Therapists can offer training in these techniques. The training may be covered by some insurance plans. Other things you can do to manage stress include:  Keeping a stress diary. This can help you learn what triggers your stress and ways to control your response.  Thinking about how you respond to certain situations. You may not be able to control everything, but you can control your reaction.  Making time for activities that help you relax, and not feeling guilty about spending your time in this way. Therapy combined with coping and stress-reduction skills provides the best chance for successful treatment. Medicines Medicines can help ease symptoms. Medicines for anxiety include:  Anti-anxiety drugs.  Antidepressants.  Beta-blockers. Medicines may be used as the main treatment for anxiety disorder, along with therapy, or if other treatments are not working. Medicines should be prescribed by a health care provider. Relationships Relationships can play a big part in helping you recover. Try to spend more time connecting with trusted friends and family members. Consider going to couples counseling, taking family education classes, or  going to family therapy. Therapy can help you and others better understand the condition. How to recognize changes in your condition Everyone has a different response to treatment for anxiety. Recovery from anxiety happens when symptoms decrease and stop interfering with your daily activities at home or work. This may mean that you will start to:  Have better concentration and focus.  Sleep better.  Be less irritable.  Have more energy.  Have improved memory. It is important to recognize when your condition is getting worse. Contact your health care provider if your symptoms interfere with home or work and you do not feel like your condition is improving. Where to find help and support: You can get help and support from these sources:  Self-help groups.  Online and [employer].  A trusted spiritual leader.  Couples counseling.  Family education classes.  Family therapy. Follow these instructions at home:  Eat a healthy diet that includes plenty of vegetables, fruits, whole grains, low-fat dairy products, and lean protein. Do not eat a lot of foods that are high in solid fats, added sugars, or salt.  Exercise. Most adults should do the following: ? Exercise for at least 150 minutes each week. The exercise should increase your heart rate and make you sweat (moderate-intensity exercise). ? Strengthening exercises at least twice a week.  Cut down on caffeine, tobacco, alcohol, and other potentially harmful substances.  Get the right amount and quality of sleep. Most adults need 7-9 hours of sleep each night.  Make choices that simplify your life.  Take over-the-counter and prescription medicines only as told by your health care provider.  Avoid caffeine, alcohol, and certain over-the-counter cold medicines. These may make you feel worse. Ask your pharmacist which medicines to avoid.  Keep all follow-up visits as told by your health care provider. This is  important. Questions to ask your health care provider  Would I benefit from therapy?  How often should I follow up with a health care provider?  How long do I need to take medicine?  Are there any long-term side effects of my medicine?  Are there any alternatives to taking medicine? Contact a health care provider if:  You have a hard time staying focused or finishing daily tasks.  You spend many hours a day feeling worried about everyday life.  You become exhausted by worry.  You start to have headaches, feel tense, or have nausea.  You urinate more than normal.  You have diarrhea. Get help right away if:  You have a racing heart and shortness of breath.  You have thoughts of hurting yourself or others. If you ever feel like you may hurt yourself or others, or have thoughts about taking your own life, get help right away. You can go to your nearest emergency department or call:  Your local emergency services (911 in the [place]  A suicide crisis helpline, such as the [facility] at 765-579-8480. This is open 24-hours a day. Summary  Taking steps to deal with stress can help calm you.  Medicines cannot cure anxiety disorders, but they can help ease symptoms.  Family, friends, and partners can play a big part in helping you recover from an anxiety disorder. This information is not intended to replace advice given to you by your health care provider. Make sure you discuss any questions you have with your health care provider. Document Released: 07/01/2016 Document Revised: 06/19/2017 Document Reviewed: 07/01/2016 Elsevier Patient Education  2020 [employer].

## 2019-03-21 ENCOUNTER — Telehealth: Payer: Self-pay

## 2019-03-21 NOTE — Telephone Encounter (Signed)
Telephone call to patient to schedule TELEHEALTH visit with patient. Patient in agreement with TELEHEALTH visit on 03-24-19 at 2:30 PM.

## 2019-03-22 ENCOUNTER — Telehealth: Payer: Self-pay

## 2019-03-22 ENCOUNTER — Other Ambulatory Visit: Payer: Self-pay

## 2019-03-22 NOTE — Progress Notes (Signed)
[facility]  [address], [address] [place], [place] [address] Phone: [phone]  Fax: [phone]   Clinic Day:  03/23/2019  Referring physician: [name], NP  Chief Complaint: Jimmy West is a 68 y.o. male with metastatic adenocarcinoma of the lung who is seen for assessmenton day 1 ofcycle #3Alimta and pembrolizumab.  HPI: The patient was last seen in the medical oncology clinic on 03/04/2019. At that time, he was on day 5 of cycle #2 Alimta and pembrolizumab.  Symptomatically, he was extremely fatigued.  He had nausea and intermittent chills.  He was dehydrated.  He had minimal improvement with IV fluids and anti-emetics.  Decision was made to admit the patient to the hospital.  He was admitted to [facility] from 03/04/2019 - 03/05/2019. He improved with IV fluids and hypokalemia was corrected with IV potassium.    He was seen in the symptom management clinic on 03/17/2019 by Jimmy Casa, NP for acute shortness of breath and shakiness. He was started on alprazolam 0.7m daily for anxiety.   During the interim, he has felt much better.  He states that he is eating more and in a better mood since starting Xanax.  He has had no further issues with nausea.  Bowel movements are normal.  Back pain is the same.  He is not golfing, but he is working.  He continues to take folic acid daily.  He took Decadron yesterday.   Past Medical History:  Diagnosis Date  . Bulging lumbar disc   . Cancer ([facility]    stage 4 lung cancer  . Heart attack ([facility]     Past Surgical History:  Procedure Laterality Date  . CARDIAC CATHETERIZATION     two stents  . KNEE SURGERY Left   . PORTA CATH INSERTION N/A 05/21/2018   Procedure: PORTA CATH INSERTION;  Surgeon: DAlgernon Huxley MD;  [facility];  Service: Cardiovascular;  Laterality: N/A;    Family History  Problem Relation Age of Onset  . Heart failure Father   . Cancer Maternal Aunt   .  Heart failure Maternal Uncle   . Dementia Paternal Grandmother   . Cancer Maternal Aunt     Social History:  reports that he quit smoking about 15 years ago. His smoking use included cigarettes. He has a 22.50 pack-year smoking history. He has never used smokeless tobacco. He reports current alcohol use of about 15.0 standard drinks of alcohol per week. He reports that he does not use drugs. He started smoking at age 68 He is smoking 1 1/2 packs/day. Patient denies known exposures to radiation ortoxins. 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  Allergies: No Known Allergies  Current Medications: Current Outpatient Medications  Medication Sig Dispense Refill  . acetaminophen (TYLENOL) 500 MG tablet Take 500 mg by mouth 3 (three) times daily as needed.    . ALPRAZolam (XANAX) 0.25 MG tablet Take 1 tablet (0.25 mg total) by mouth 2 (two) times daily as needed for anxiety. 30 tablet 0  . aspirin EC 81 MG tablet Take 81 mg by mouth daily.    . Calcium 600-400 MG-UNIT CHEW Chew 2 tablets by mouth daily.    .Marland Kitchendexamethasone (DECADRON) 4 MG tablet Take 1 tab two times a day the day before Alimta chemo, then take 2 tabs once a day for 3 days starting the day after chemo.  30 tablet 1  . fentaNYL (DURAGESIC) 75 MCG/HR Place 1 patch onto the skin every 3 (three) days. 10 patch 0  . FLUoxetine (PROZAC) 40 MG capsule Take 1 capsule (40 mg total) by mouth daily. 30 capsule 3  . folic acid (FOLVITE) 1 MG tablet Take 1 tablet (1 mg total) by mouth daily. Start 5-7 days before Alimta chemotherapy. Continue until 21 days after Alimta completed. 100 tablet 3  . ibuprofen (ADVIL,MOTRIN) 200 MG tablet Take 200 mg by mouth every 6 (six) hours as needed.    . ondansetron (ZOFRAN ODT) 4 MG disintegrating tablet Take 1 tablet (4 mg total) by mouth every 6 (six) hours as needed for nausea or vomiting. 30 tablet 1  .  Oxycodone HCl 20 MG TABS Take 0.5-1 tablets (10-20 mg total) by mouth every 4 (four) hours as needed (for severe pain). 90 tablet 0  . pantoprazole (PROTONIX) 40 MG tablet Take 1 tablet (40 mg total) by mouth daily. 30 tablet 1  . polyethylene glycol (MIRALAX / GLYCOLAX) packet Take 17 g by mouth daily.    Marland Kitchen senna (SENOKOT) 8.6 MG tablet Take 1 tablet by mouth as needed.      No current facility-administered medications for this visit.    Facility-Administered Medications Ordered in Other Visits  Medication Dose Route Frequency Provider Last Rate Last Dose  . 0.9 %  sodium chloride infusion   Intravenous Once Corcoran, Melissa C, MD      . 0.9 %  sodium chloride infusion   Intravenous Once PRN Corcoran, Melissa C, MD      . albuterol (PROVENTIL) (2.5 MG/3ML) 0.083% nebulizer solution 2.5 mg  2.5 mg Nebulization Once PRN Corcoran, Melissa C, MD      . alteplase (CATHFLO ACTIVASE) injection 2 mg  2 mg Intracatheter Once PRN Lequita Asal, MD      . EPINEPHrine (ADRENALIN) 1 MG/10ML injection 0.25 mg  0.25 mg Intravenous Once PRN Corcoran, Melissa C, MD      . EPINEPHrine (ADRENALIN) 1 MG/10ML injection 0.25 mg  0.25 mg Intravenous Once PRN Corcoran, Melissa C, MD      . heparin lock flush 100 unit/mL  500 Units Intracatheter Once PRN Mike Gip, Melissa C, MD      . heparin lock flush 100 unit/mL  250 Units Intracatheter Once PRN Nolon Stalls C, MD      . sodium chloride flush (NS) 0.9 % injection 10 mL  10 mL Intravenous PRN Nolon Stalls C, MD   10 mL at 03/04/19 0959  . sodium chloride flush (NS) 0.9 % injection 10 mL  10 mL Intracatheter Once PRN Corcoran, Melissa C, MD      . sodium chloride flush (NS) 0.9 % injection 3 mL  3 mL Intracatheter Once PRN Lequita Asal, MD        Review of Systems  Constitutional: Negative.  Negative for chills, fever, malaise/fatigue and weight loss (stable).  HENT: Negative.  Negative for congestion, ear pain, hearing loss, nosebleeds,  sinus pain and sore throat.   Eyes: Negative.  Negative for blurred vision and double vision.  Respiratory: Negative.  Negative for cough, hemoptysis, sputum production, shortness of breath and wheezing.   Cardiovascular: Negative.  Negative for chest pain, palpitations, orthopnea, leg swelling and PND.  Gastrointestinal: Negative.  Negative for abdominal pain, blood in stool, constipation, diarrhea, heartburn, melena, nausea and vomiting.       Appetite improved.  Genitourinary: Negative.  Negative for dysuria, frequency, hematuria and  urgency.  Musculoskeletal: Positive for back pain (stable). Negative for falls, joint pain, myalgias and neck pain.  Skin: Negative.  Negative for itching and rash.  Neurological: Negative.  Negative for dizziness, tingling, tremors, sensory change, speech change, focal weakness, weakness and headaches.  Endo/Heme/Allergies: Negative.  Does not bruise/bleed easily.  Psychiatric/Behavioral: Negative for depression and memory loss. The patient is nervous/anxious (improved on Xanax). The patient does not have insomnia.   All other systems reviewed and are negative.  Performance status (ECOG): 1  Vitals Blood pressure 108/73, pulse 77, temperature (!) 97.3 F (36.3 C), temperature source Tympanic, resp. rate 18, weight 148 lb 7.7 oz (67.4 kg), SpO2 99 %.   Physical Exam  Constitutional: He is oriented to person, place, and time. He appears well-developed and well-nourished. No distress.  HENT:  Head: Normocephalic and atraumatic.  Brown hair with slight graying.  Mustache. Dentures.  Mask.  Eyes: Pupils are equal, round, and reactive to light. Conjunctivae and EOM are normal. No scleral icterus.  Glasses.  Gray blue eyes.  Neck: Normal range of motion. Neck supple. No JVD present.  Cardiovascular: Normal rate, regular rhythm and normal heart sounds. Exam reveals no gallop and no friction rub.  No murmur heard. Pulmonary/Chest: Effort normal and breath sounds  normal. No respiratory distress. He has no wheezes. He has no rales.  Abdominal: Soft. Bowel sounds are normal. He exhibits no distension and no mass. There is no abdominal tenderness. There is no rebound and no guarding.  Musculoskeletal: Normal range of motion.        General: Tenderness (slight at T4) present. No edema.  Lymphadenopathy:    He has no cervical adenopathy.    He has no axillary adenopathy.       Right: No supraclavicular adenopathy present.       Left: No supraclavicular adenopathy present.  Neurological: He is alert and oriented to person, place, and time.  Skin: Skin is warm and dry. No rash noted. He is not diaphoretic. No erythema. No pallor.  Psychiatric: He has a normal mood and affect. His behavior is normal. Judgment and thought content normal.  Nursing note and vitals reviewed.   Infusion on 03/23/2019  Component Date Value Ref Range Status  . T4, Total 03/23/2019 9.4  4.5 - 12.0 ug/dL Final   Comment: (NOTE) Performed At: [facility] [place], [place] [id number] Rush Farmer MD VC:[id number]   . TSH 03/23/2019 1.341  0.350 - 4.500 uIU/mL Final   Comment: Performed by a 3rd Generation assay with a functional sensitivity of <=0.01 uIU/mL. Performed at [facility], [address] [place], [place] [address]   . Sodium 03/23/2019 136  135 - 145 mmol/L Final  . Potassium 03/23/2019 3.8  3.5 - 5.1 mmol/L Final  . Chloride 03/23/2019 102  98 - 111 mmol/L Final  . CO2 03/23/2019 23  22 - 32 mmol/L Final  . Glucose, Bld 03/23/2019 118* 70 - 99 mg/dL Final  . BUN 03/23/2019 14  8 - 23 mg/dL Final  . Creatinine, Ser 03/23/2019 1.32* 0.61 - 1.24 mg/dL Final  . Calcium 03/23/2019 9.1  8.9 - 10.3 mg/dL Final  . Total Protein 03/23/2019 7.4  6.5 - 8.1 g/dL Final  . Albumin 03/23/2019 4.0  3.5 - 5.0 g/dL Final  . AST 03/23/2019 26  15 - 41 U/L Final  . ALT 03/23/2019 16  0 - 44 U/L Final  . Alkaline Phosphatase 03/23/2019 69  38 -  126  U/L Final  . Total Bilirubin 03/23/2019 0.7  0.3 - 1.2 mg/dL Final  . GFR calc non Af Amer 03/23/2019 55* >60 mL/min Final  . GFR calc Af Amer 03/23/2019 >60  >60 mL/min Final  . Anion gap 03/23/2019 11  5 - 15 Final   Performed at [facility], [address], [place], [place] [address]  . WBC 03/23/2019 10.7* 4.0 - 10.5 K/uL Final  . RBC 03/23/2019 3.71* 4.22 - 5.81 MIL/uL Final  . Hemoglobin 03/23/2019 11.1* 13.0 - 17.0 g/dL Final  . HCT 03/23/2019 33.3* 39.0 - 52.0 % Final  . MCV 03/23/2019 89.8  80.0 - 100.0 fL Final  . MCH 03/23/2019 29.9  26.0 - 34.0 pg Final  . MCHC 03/23/2019 33.3  30.0 - 36.0 g/dL Final  . RDW 03/23/2019 16.5* 11.5 - 15.5 % Final  . Platelets 03/23/2019 491* 150 - 400 K/uL Final  . nRBC 03/23/2019 0.0  0.0 - 0.2 % Final  . Neutrophils Relative % 03/23/2019 70  % Final  . Neutro Abs 03/23/2019 7.6  1.7 - 7.7 K/uL Final  . Lymphocytes Relative 03/23/2019 14  % Final  . Lymphs Abs 03/23/2019 1.5  0.7 - 4.0 K/uL Final  . Monocytes Relative 03/23/2019 12  % Final  . Monocytes Absolute 03/23/2019 1.3* 0.1 - 1.0 K/uL Final  . Eosinophils Relative 03/23/2019 2  % Final  . Eosinophils Absolute 03/23/2019 0.2  0.0 - 0.5 K/uL Final  . Basophils Relative 03/23/2019 1  % Final  . Basophils Absolute 03/23/2019 0.1  0.0 - 0.1 K/uL Final  . Immature Granulocytes 03/23/2019 1  % Final  . Abs Immature Granulocytes 03/23/2019 0.07  0.00 - 0.07 K/uL Final   Performed at [facility], [address], [place], [place] [address]  . Magnesium 03/23/2019 2.1  1.7 - 2.4 mg/dL Final   Performed at [facility], [address] [place], [place] [address]  . Color, Urine 03/23/2019 AMBER* YELLOW Final   BIOCHEMICALS MAY BE AFFECTED BY COLOR  . APPearance 03/23/2019 HAZY* CLEAR Final  . Specific Gravity, Urine 03/23/2019 >1.030* 1.005 - 1.030 Final  . pH 03/23/2019 5.5  5.0 - 8.0 Final  . Glucose, UA 03/23/2019 NEGATIVE  NEGATIVE mg/dL Final   . Hgb urine dipstick 03/23/2019 NEGATIVE  NEGATIVE Final  . Bilirubin Urine 03/23/2019 SMALL* NEGATIVE Final  . Ketones, ur 03/23/2019 TRACE* NEGATIVE mg/dL Final  . Protein, ur 03/23/2019 100* NEGATIVE mg/dL Final  . Nitrite 03/23/2019 NEGATIVE  NEGATIVE Final  . Leukocytes,Ua 03/23/2019 NEGATIVE  NEGATIVE Final  . Squamous Epithelial / LPF 03/23/2019 0-5  0 - 5 Final  . WBC, UA 03/23/2019 11-20  0 - 5 WBC/hpf Final  . RBC / HPF 03/23/2019 6-10  0 - 5 RBC/hpf Final  . Bacteria, UA 03/23/2019 MANY* NONE SEEN Final  . Hyaline Casts, UA 03/23/2019 PRESENT   Final  . Amorphous Crystal 03/23/2019 PRESENT   Final   Performed at [facility], [address] [place], [place] [address]    Assessment:  [employer] is a 68 y.o. male with metastatichigh-grade adenocarcinomaof the right lungs/p CT-guided biopsy of a RLL lung mass on 05/11/2018. Pathologyrevealed an invasive high-grade adenocarcinoma, with predominantly solid growth pattern. The neoplastic cells were TTF-1 (+), Napsin A (+), and P40 (+). He has a T4 vertebral metastasis. Clinical stage is T4N1M1.  There was not enough material for Foundation One testing. PDL-1revealed TPS 90%.  PET scanon  04/30/2018 revealed a 3.4 cm hypermetabolic RLL pulmonary mass (SUV 13.2), 11 mm pulmonary nodule in the LEFT lung (SUV 4.5), RIGHT paratracheal lymph node (SUV 5.1), and hypermetabolic activity within the T4 vertebral body (SUV 10.2).   Thoracic spine MRIon 05/03/2018 revealed T4 metastasiswith a 40% pathologic compression deformity and 5 mm of retropulsion of the vertebral body. Retropulsion results in mild spinal canal stenosis and mild bilateral C4-5 foraminal stenosis. There was paravertebral soft tissue thickening from mid T3 to mid T5, likely representing edema related to the pathologic compression deformity vs. possible extraosseous extension of the neoplasm. There were no additional thoracic spinal  metastases noted.   Head MRIon 05/03/2018 revealed no intracranial metastatic disease. 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 Incidental mention made of mild paranasal sinus disease.  Hereceived 11cycles ofpembrolizumab(05/24/2018 - 01/10/2019). He toleratedtreatment well. CEAwas 1.3 on 08/30/2018.LDHwas 165 on 11/29/2018.  He completed T4 radiationon 07/07/2018. He receives Xgevamonthly (06/03/2018 - 02/07/2019).  He received 1 cycle of carboplatin, Alimta, and pembrolizumab on 02/08/2019.  Cycle #1 was complicated by nausea, vomiting, and diarrhea necessitating hospitalization.  Decision made to hold carboplatin with his second dose.  He is s/pcycle #2 Alimta and pembrolizumab (02/28/2019).Cycle #2 was complicated by nausea, vomiting, and dehydration requring fluids in clinic and an overnight stay in the hospital.  He hascancer-related painin T4. He is currently taking Fentanyl75 mcg/hr and oxycodone10 mg every 4 hours prn.  Cervical and thoracic spine CT [facility] 01/10/2019 revealed unchanged severe compression deformity/vertebral plana of T4 with a stable degree of retropulsion and focal mild spinal canal stenosis. There was interval enlargement of a right lower lobe pulmonary mass(4.5 x 3.5 cm compared to 3.9 x 2.9 cm)with redemonstrated spiculated pulmonary nodules.  Chest, abdomen, pelvisCTon 01/28/2019 revealed slight interval enlargement of a right lower lobe mass with central necrosis measuring 4.6 x 3.4 cm, previously 4.0 x 3.0 cm. There was no change in right upper lobe nodules(1.4 and 0.8 cm).Therewas almost no residua of left lung nodules, with irregular opacities in the apical left upper lobeandsuperior segment left lower lobe. There was no change in right hilar soft  tissue and lymph nodes.There wasvertebra plana deformity of T4andno evidence of new osseous metastatic disease. No evidence of distant metastatic disease in the abdomen or pelvis.The1.6 x 1.1 cmleft adrenal nodule(non-metabolic on prior PET scan),was unchanged.  Symptomatically, he is doing well.  He denies any GI symptoms.  He is eating well.  Anxiety has improved on Xanax.  Exam is stable.  Creatinine is 1.32.  Plan: 1.  Labs today:  CBC with diff, CMP, Mg, TSH, free T4. 2.  Metastatic high-grade adenocarcinoma the RIGHT lung He is s/p 11 cycles of pembrolizumab. He has struggled since his first cycle of chemotherapy (as compared to immunotherapy alone)  Carboplatin, Alimta + pembrolizumab (02/08/2019) resulting in admission  Alimta + pembrolizumab (02/28/2019) resulting in admission. Treatment without carboplatin was less problematic. He is back to baseline health today, but suspect will need IVF support daily post chemotherapy. Discuss postponing chemotherapy secondary to need for IVF in clinic daily to prevent admission (usually day 5).  Upcoming is a long holiday weekend.  Start chemotherapy earlier in the week.  Patient in agreement.  Discuss adding back carboplatin slowly after this cycle if tolerates well.  B12 today for ongoing Alimta.  Hold chemotherapy today. 3.Renal insufficiency  Creatinine 1.32 (CrCl 48.3 ml/min)   Baseline creatinine 0.61-1.0  Clinically no evidence of dehydration.  Last CT scan with contrast on 02/11/2019.  Unclear if related to pembrolizumab.  Check urinalysis.  Renal ultrasound. 4.   Bone metastasis  Encourage patient to follow-up with [facility] regarding T4 lesion.  Postpone Xgeva secondary to increase in creatinine. 5.   Cancer related pain  Pain is well controlled on current regimen.  Continue fentanyl 75 mcg patch and oxycodone 10 mg p.o. every 4 hours as needed pain. 6.   Anxiety  Clinically doing better on low dose Xanax. 7.  RTC  on 03/29/2019 for MD assessment, labs (CBC with diff, CMP, Mg), and pembrolizumab + Alimta  Addendum:  Renal ultrasound on 03/24/2019 revealed no acute abnormality identified. There was no hydronephrosis or bladder distention.  I discussed the assessment and treatment plan with the patient.  The patient was provided an opportunity to ask questions and all were answered.  The patient agreed with the plan and demonstrated an understanding of the instructions.  The patient was advised to call back if the symptoms worsen or if the condition fails to improve as anticipated.   Lequita Asal, MD, PhD    03/23/2019, 3:45 PM

## 2019-03-22 NOTE — Telephone Encounter (Signed)
spoke with the patient to inform him, per the patient he wanted to know do he need to take his pre medication today as schedule. I have informed him yes because we do have him down for chemo treatment tomorrow. The patient was understanding and agreeable for the clarification.

## 2019-03-23 ENCOUNTER — Telehealth: Payer: Self-pay

## 2019-03-23 ENCOUNTER — Inpatient Hospital Stay: Payer: Medicare Other

## 2019-03-23 ENCOUNTER — Encounter: Payer: Self-pay | Admitting: Hematology and Oncology

## 2019-03-23 ENCOUNTER — Other Ambulatory Visit: Payer: Self-pay

## 2019-03-23 ENCOUNTER — Inpatient Hospital Stay: Payer: Medicare Other | Attending: Hematology and Oncology | Admitting: Hematology and Oncology

## 2019-03-23 VITALS — BP 108/73 | HR 77 | Temp 97.3°F | Resp 18 | Wt 148.5 lb

## 2019-03-23 DIAGNOSIS — C3431 Malignant neoplasm of lower lobe, right bronchus or lung: Secondary | ICD-10-CM

## 2019-03-23 DIAGNOSIS — N289 Disorder of kidney and ureter, unspecified: Secondary | ICD-10-CM

## 2019-03-23 DIAGNOSIS — C7951 Secondary malignant neoplasm of bone: Secondary | ICD-10-CM | POA: Diagnosis not present

## 2019-03-23 DIAGNOSIS — Z5111 Encounter for antineoplastic chemotherapy: Secondary | ICD-10-CM | POA: Diagnosis not present

## 2019-03-23 DIAGNOSIS — Z79899 Other long term (current) drug therapy: Secondary | ICD-10-CM | POA: Diagnosis not present

## 2019-03-23 DIAGNOSIS — G893 Neoplasm related pain (acute) (chronic): Secondary | ICD-10-CM | POA: Insufficient documentation

## 2019-03-23 DIAGNOSIS — C7801 Secondary malignant neoplasm of right lung: Secondary | ICD-10-CM | POA: Insufficient documentation

## 2019-03-23 DIAGNOSIS — C801 Malignant (primary) neoplasm, unspecified: Secondary | ICD-10-CM | POA: Diagnosis not present

## 2019-03-23 DIAGNOSIS — Z7189 Other specified counseling: Secondary | ICD-10-CM | POA: Diagnosis not present

## 2019-03-23 DIAGNOSIS — F419 Anxiety disorder, unspecified: Secondary | ICD-10-CM | POA: Insufficient documentation

## 2019-03-23 DIAGNOSIS — Z923 Personal history of irradiation: Secondary | ICD-10-CM | POA: Insufficient documentation

## 2019-03-23 DIAGNOSIS — E860 Dehydration: Secondary | ICD-10-CM | POA: Insufficient documentation

## 2019-03-23 LAB — CBC WITH DIFFERENTIAL/PLATELET
Abs Immature Granulocytes: 0.07 10*3/uL (ref 0.00–0.07)
Basophils Absolute: 0.1 10*3/uL (ref 0.0–0.1)
Basophils Relative: 1 %
Eosinophils Absolute: 0.2 10*3/uL (ref 0.0–0.5)
Eosinophils Relative: 2 %
HCT: 33.3 % — ABNORMAL LOW (ref 39.0–52.0)
Hemoglobin: 11.1 g/dL — ABNORMAL LOW (ref 13.0–17.0)
Immature Granulocytes: 1 %
Lymphocytes Relative: 14 %
Lymphs Abs: 1.5 10*3/uL (ref 0.7–4.0)
MCH: 29.9 pg (ref 26.0–34.0)
MCHC: 33.3 g/dL (ref 30.0–36.0)
MCV: 89.8 fL (ref 80.0–100.0)
Monocytes Absolute: 1.3 10*3/uL — ABNORMAL HIGH (ref 0.1–1.0)
Monocytes Relative: 12 %
Neutro Abs: 7.6 10*3/uL (ref 1.7–7.7)
Neutrophils Relative %: 70 %
Platelets: 491 10*3/uL — ABNORMAL HIGH (ref 150–400)
RBC: 3.71 MIL/uL — ABNORMAL LOW (ref 4.22–5.81)
RDW: 16.5 % — ABNORMAL HIGH (ref 11.5–15.5)
WBC: 10.7 10*3/uL — ABNORMAL HIGH (ref 4.0–10.5)
nRBC: 0 % (ref 0.0–0.2)

## 2019-03-23 LAB — COMPREHENSIVE METABOLIC PANEL
ALT: 16 U/L (ref 0–44)
AST: 26 U/L (ref 15–41)
Albumin: 4 g/dL (ref 3.5–5.0)
Alkaline Phosphatase: 69 U/L (ref 38–126)
Anion gap: 11 (ref 5–15)
BUN: 14 mg/dL (ref 8–23)
CO2: 23 mmol/L (ref 22–32)
Calcium: 9.1 mg/dL (ref 8.9–10.3)
Chloride: 102 mmol/L (ref 98–111)
Creatinine, Ser: 1.32 mg/dL — ABNORMAL HIGH (ref 0.61–1.24)
GFR calc Af Amer: >60 mL/min (ref >60)
GFR calc non Af Amer: 55 mL/min — ABNORMAL LOW (ref >60)
Glucose, Bld: 118 mg/dL — ABNORMAL HIGH (ref 70–99)
Potassium: 3.8 mmol/L (ref 3.5–5.1)
Sodium: 136 mmol/L (ref 135–145)
Total Bilirubin: 0.7 mg/dL (ref 0.3–1.2)
Total Protein: 7.4 g/dL (ref 6.5–8.1)

## 2019-03-23 LAB — URINALYSIS, COMPLETE (UACMP) WITH MICROSCOPIC
Glucose, UA: NEGATIVE mg/dL
Hgb urine dipstick: NEGATIVE
Leukocytes,Ua: NEGATIVE
Nitrite: NEGATIVE
Protein, ur: 100 mg/dL — AB
Specific Gravity, Urine: >1.03 — ABNORMAL HIGH (ref 1.005–1.030)
pH: 5.5 (ref 5.0–8.0)

## 2019-03-23 LAB — MAGNESIUM: Magnesium: 2.1 mg/dL (ref 1.7–2.4)

## 2019-03-23 LAB — TSH: TSH: 1.341 u[IU]/mL (ref 0.350–4.500)

## 2019-03-23 MED ORDER — HEPARIN SOD (PORK) LOCK FLUSH 100 UNIT/ML IV SOLN
500.0000 [IU] | Freq: Once | INTRAVENOUS | Status: AC
  Administered 2019-03-23: 500 [IU] via INTRAVENOUS

## 2019-03-23 MED ORDER — CYANOCOBALAMIN 1000 MCG/ML IJ SOLN
1000.0000 ug | Freq: Once | INTRAMUSCULAR | Status: AC
  Administered 2019-03-23: 1000 ug via INTRAMUSCULAR

## 2019-03-23 MED ORDER — DEXAMETHASONE 4 MG PO TABS: ORAL_TABLET | ORAL | 1 refills | Status: DC

## 2019-03-23 MED ORDER — SODIUM CHLORIDE 0.9% FLUSH
10.0000 mL | INTRAVENOUS | Status: DC | PRN
  Administered 2019-03-23: 10 mL via INTRAVENOUS
  Filled 2019-03-23: qty 10

## 2019-03-23 NOTE — Progress Notes (Signed)
No chemotherapy today. Urine sample obtained. B12 injection given.

## 2019-03-23 NOTE — Telephone Encounter (Signed)
Attempted to reach out to patient regarding UA results. VM left requesting callback.

## 2019-03-23 NOTE — Telephone Encounter (Signed)
-----   Message from Lequita Asal, MD sent at 03/23/2019  4:10 PM EDT ----- Regarding: Please call patient  Urinalysis is concentrated.   Patient may be somewhat dehydrated. Drink more fluid.    There is also some protein in urine. I am going to refer him to nephrology.  M ----- Message ----- From: Interface, Lab In [place] Sent: 03/23/2019   1:40 PM EDT To: Lequita Asal, MD

## 2019-03-23 NOTE — Progress Notes (Signed)
Pt here for follow up. Reports since starting Xanax has felt so much better. Denies any concerns.

## 2019-03-24 ENCOUNTER — Other Ambulatory Visit: Payer: Medicare Other

## 2019-03-24 ENCOUNTER — Ambulatory Visit
Admission: RE | Admit: 2019-03-24 | Discharge: 2019-03-24 | Disposition: A | Discharge status: Home / Self Care | Payer: Medicare Other | Source: Ambulatory Visit | Attending: Hematology and Oncology | Admitting: Hematology and Oncology

## 2019-03-24 ENCOUNTER — Other Ambulatory Visit: Payer: Self-pay

## 2019-03-24 DIAGNOSIS — Z515 Encounter for palliative care: Secondary | ICD-10-CM

## 2019-03-24 DIAGNOSIS — N289 Disorder of kidney and ureter, unspecified: Secondary | ICD-10-CM | POA: Insufficient documentation

## 2019-03-24 DIAGNOSIS — N281 Cyst of kidney, acquired: Secondary | ICD-10-CM | POA: Insufficient documentation

## 2019-03-24 LAB — T4: T4, Total: 9.4 ug/dL (ref 4.5–12.0)

## 2019-03-24 NOTE — Progress Notes (Signed)
COMMUNITY PALLIATIVE CARE SW NOTE  PATIENT NAME: Dailyn Kempner DOB: 1951/07/12 MRN: [id number]  PRIMARY CARE PROVIDER: [name], NP  RESPONSIBLE PARTY:  Acct ID - Guarantor Home Phone Work Phone Relationship Acct Type  [id number] BODIE, [phone]  Self P/F     [address], [address], [place], [place] [address]     PLAN OF CARE and INTERVENTIONS:             1. GOALS OF CARE/ ADVANCE CARE PLANNING:  Patient is a FULL CODE. Patient has not completed advance care planning, discussion to continue with cancer center and palliative care team. 2. SOCIAL/EMOTIONAL/SPIRITUAL ASSESSMENT/ INTERVENTIONS:  SW and RN completed TELEHEALTH visit with patient. Patient said "I actually feel pretty good". Patient noted that he had a rough week last week, had anxiety and panic attack episode with SOB. Patient started Xanax on 8/27 and reports that this has helped "tremendously". Patient said he was also referred to [facility], LCSW with Cancer center and has talked with her two times and felt that this was helpful. Patient notes continued pain in back, especially with working but patient said he has cut hours at work to about fours a day. Patient said he has a better appetite, and is sleeping better and feels that Xanax is helping the sleep as well.   3. PATIENT/CAREGIVER EDUCATION/ COPING:  Patient reports coping "better". Patient is talking with LCSW at Cancer center. Patient acknowledged that he does worry and has limited social support - a golf friend and a work friend. SW validated concerns, provided emotional support and used active and reflective listening.  4. PERSONAL EMERGENCY PLAN:  Patient will call 9-1-1 for emergencies.  5. COMMUNITY RESOURCES COORDINATION/ HEALTH CARE NAVIGATION:  Patient coordinates his medical care. Patient continues to go to treatment at Cancer center. Patient is talking with Barnabas Lister, SW at Cancer center and he is trying to help patient with rent, utility assistance.   6. FINANCIAL/LEGAL CONCERNS/INTERVENTIONS:  Patient notes that "things have been tight", but he is "okay" and has food, medications, etc. Cancer center SW helping with resources. SW encouraged patient to also consider applying for Medicaid, call DSS.     SOCIAL HX:  Social History   Tobacco Use  . Smoking status: Former Smoker    Packs/day: 1.50    Years: 15.00    Pack years: 22.50    Types: Cigarettes    Quit date: 11/03/2003    Years since quitting: 15.3  . Smokeless tobacco: Never Used  Substance Use Topics  . Alcohol use: Yes    Alcohol/week: 15.0 standard drinks    Types: 15 Shots of liquor per week    CODE STATUS:   Code Status: Prior (Full code) ADVANCED DIRECTIVES: N MOST FORM COMPLETE:  No. HOSPICE EDUCATION PROVIDED: None.  PPS: Patient is independent of ADLs.  Due to the COVID-19 crisis, this visit was done viatelephonefrom my office and it was initiated and consent by this patient and/or family.This was a scheduled visit.  I spent82minutes with patient/family, from2:30-3:00pproviding education, support and consultation.  Margaretmary Lombard, LCSW

## 2019-03-24 NOTE — Telephone Encounter (Signed)
Contacted patient and informed him urine was concentrated and Dr. Mike Gip would like for him to increase his fluid intake. Patient also made aware that protein was in UA and to be expecting a call from nephrologist to schedule appt. Advised patient to reach out to our office if he does not hear back within 1 week. Patient verbalizes understanding and denies any further questions or concerns.

## 2019-03-24 NOTE — Progress Notes (Signed)
[facility]  [address], [address] [place], [place] [address] Phone: [phone]  Fax: [phone]   Clinic Day:  03/29/2019  Referring physician: [name], NP  Chief Complaint: Jimmy West is a 68 y.o. male with metastatic adenocarcinoma of the lung who is seen for assessment on day 1 of cycle #3 Alimta and pembrolizumab.  HPI:  The patient was last seen in the medical oncology clinic on 03/23/2019. At that time, he was doing well.  He denied any GI symptoms.  He was eating well.  Anxiety had improved on Xanax.  Exam was stable.  Creatinine was 1.32 (elevated).  Decision was made to postpone chemotherapy secondary to the need for daily IVF post chemotherapy and the upcoming holiday weekend (prior admissions on day 5 post chemotherapy).   He underwent evaluation for his elevated creatinine (baseline 0.60 - 1.0).  Urinalysis revealed a specific gravity > 1.030, small bilirubin, trace ketones, protein 100 mg/dL and many bacteria.  Renal ultrasound on 03/24/2019 revealed no acute abnormality identified. There was no hydronephrosis or bladder distention.  He was encouraged to drink fluids.  Nephrology was consulted.  During the interim, he has felt "fine". He has no complaints today. His back pain is well controlled. Patient reports Xanax 0.25 mg BID has helped with his anxiety tremendously.    Past Medical History:  Diagnosis Date   Bulging lumbar disc    Cancer ([place])    stage 4 lung cancer   Heart attack [facility])     Past Surgical History:  Procedure Laterality Date   CARDIAC CATHETERIZATION     two stents   KNEE SURGERY Left    PORTA CATH INSERTION N/A 05/21/2018   Procedure: PORTA CATH INSERTION;  Surgeon: Algernon Huxley, MD;  Location: [facility];  Service: Cardiovascular;  Laterality: N/A;    Family History  Problem Relation Age of Onset   Heart failure Father    Cancer Maternal Aunt    Heart failure Maternal Uncle      Dementia Paternal Grandmother    Cancer Maternal Aunt     Social History:  reports that he quit smoking about 15 years ago. His smoking use included cigarettes. He has a 22.50 pack-year smoking history. He has never used smokeless tobacco. He reports current alcohol use of about 15.0 standard drinks of alcohol per week. He reports that he does not use drugs. He started smoking at age 16. He is smoking 1 1/2 packs/day. Patient denies known exposures to radiation ortoxins. 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 The patient is alone today.  Allergies: No Known Allergies  Current Medications: Current Outpatient Medications  Medication Sig Dispense Refill   ALPRAZolam (XANAX) 0.25 MG tablet Take 1 tablet (0.25 mg total) by mouth 2 (two) times daily as needed for anxiety. 30 tablet 0   aspirin EC 81 MG tablet Take 81 mg by mouth daily.     Calcium 600-400 MG-UNIT CHEW Chew 2 tablets by mouth daily.     dexamethasone (DECADRON) 4 MG tablet Take 1 tab two times a day the day before Alimta chemo, then take 2 tabs once a day for 3 days starting the day after chemo. 30 tablet 1   fentaNYL (DURAGESIC) 75 MCG/HR Place 1 patch onto the skin every 3 (three) days. 10 patch 0   FLUoxetine (PROZAC) 40 MG capsule Take 1 capsule (40 mg total) by mouth  daily. 30 capsule 3   folic acid (FOLVITE) 1 MG tablet Take 1 tablet (1 mg total) by mouth daily. Start 5-7 days before Alimta chemotherapy. Continue until 21 days after Alimta completed. 100 tablet 3   Oxycodone HCl 20 MG TABS Take 0.5-1 tablets (10-20 mg total) by mouth every 4 (four) hours as needed (for severe pain). 90 tablet 0   pantoprazole (PROTONIX) 40 MG tablet Take 1 tablet (40 mg total) by mouth daily. 30 tablet 1   polyethylene glycol (MIRALAX / GLYCOLAX) packet Take 17 g by mouth daily.     acetaminophen (TYLENOL) 500 MG tablet Take 500 mg by mouth 3 (three) times  daily as needed.     ibuprofen (ADVIL,MOTRIN) 200 MG tablet Take 200 mg by mouth every 6 (six) hours as needed.     ondansetron (ZOFRAN ODT) 4 MG disintegrating tablet Take 1 tablet (4 mg total) by mouth every 6 (six) hours as needed for nausea or vomiting. (Patient not taking: Reported on 03/29/2019) 30 tablet 1   senna (SENOKOT) 8.6 MG tablet Take 1 tablet by mouth as needed.      No current facility-administered medications for this visit.    Facility-Administered Medications Ordered in Other Visits  Medication Dose Route Frequency Provider Last Rate Last Dose   0.9 %  sodium chloride infusion   Intravenous Once Treyvone Chelf C, MD       0.9 %  sodium chloride infusion   Intravenous Once PRN Brenten Janney C, MD       albuterol (PROVENTIL) (2.5 MG/3ML) 0.083% nebulizer solution 2.5 mg  2.5 mg Nebulization Once PRN Giovanny Dugal C, MD       alteplase (CATHFLO ACTIVASE) injection 2 mg  2 mg Intracatheter Once PRN Lequita Asal, MD       EPINEPHrine (ADRENALIN) 1 MG/10ML injection 0.25 mg  0.25 mg Intravenous Once PRN Takao Lizer C, MD       EPINEPHrine (ADRENALIN) 1 MG/10ML injection 0.25 mg  0.25 mg Intravenous Once PRN Shedric Fredericks C, MD       heparin lock flush 100 unit/mL  500 Units Intracatheter Once PRN Lequita Asal, MD       heparin lock flush 100 unit/mL  250 Units Intracatheter Once PRN Lequita Asal, MD       heparin lock flush 100 unit/mL  500 Units Intravenous Once Kensie Susman C, MD       sodium chloride flush (NS) 0.9 % injection 10 mL  10 mL Intravenous PRN Nolon Stalls C, MD   10 mL at 03/04/19 0959   sodium chloride flush (NS) 0.9 % injection 10 mL  10 mL Intracatheter Once PRN Nolon Stalls C, MD       sodium chloride flush (NS) 0.9 % injection 10 mL  10 mL Intravenous PRN Mike Gip, Maeryn Mcgath C, MD   10 mL at 03/29/19 0846   sodium chloride flush (NS) 0.9 % injection 3 mL  3 mL Intracatheter Once PRN Lequita Asal, MD        Review of Systems  Constitutional: Negative for chills, diaphoresis, fever, malaise/fatigue and weight loss (up 3 lbs).       Feels "fine". "No problems".  HENT: Negative.  Negative for congestion, ear pain, hearing loss, nosebleeds, sinus pain, sore throat and tinnitus.   Eyes: Negative.  Negative for blurred vision, double vision and photophobia.  Respiratory: Negative.  Negative for cough, hemoptysis, sputum production and shortness of breath.   Cardiovascular:  Negative.  Negative for chest pain, palpitations and leg swelling.  Gastrointestinal: Negative.  Negative for abdominal pain, blood in stool, constipation, diarrhea, melena, nausea and vomiting.  Genitourinary: Negative.  Negative for dysuria, frequency, hematuria and urgency.  Musculoskeletal: Positive for back pain (slight "nerve pain" at T4). Negative for joint pain, myalgias and neck pain.  Skin: Negative.  Negative for itching and rash.  Neurological: Negative.  Negative for dizziness, tingling (mid back, occasional), tremors, sensory change, speech change, focal weakness, weakness and headaches.  Endo/Heme/Allergies: Negative.  Does not bruise/bleed easily.  Psychiatric/Behavioral: Negative.  Negative for depression and memory loss. The patient is not nervous/anxious and does not have insomnia.   All other systems reviewed and are negative.  Performance status (ECOG):  1  Vitals Blood pressure 128/78, pulse 92, temperature 98.6 F (37 C), temperature source Tympanic, resp. rate 18, weight 151 lb 12.6 oz (68.9 kg), SpO2 97 %.  Physical Exam  Constitutional: He is oriented to person, place, and time. He appears well-developed and well-nourished. No distress.  HENT:  Head: Normocephalic and atraumatic.  Mouth/Throat: Oropharynx is clear and moist. No oropharyngeal exudate.  Brown hair slight graying.  Mustache.  Dentures.  Mask.  Eyes: Pupils are equal, round, and reactive to light. Conjunctivae and  EOM are normal. No scleral icterus.  Glasses. Gray blue eyes.  Neck: Normal range of motion. Neck supple. No JVD present.  Cardiovascular: Normal rate, regular rhythm and normal heart sounds.  No murmur heard. Pulmonary/Chest: Effort normal and breath sounds normal. No respiratory distress. He has no wheezes. He has no rales.  Abdominal: Soft. Bowel sounds are normal. He exhibits no distension and no mass. There is no abdominal tenderness. There is no rebound and no guarding.  Musculoskeletal: Normal range of motion.        General: No tenderness or edema.     Comments: Slight pain on palpation at T4 (no change).  Lymphadenopathy:    He has no cervical adenopathy.    He has no axillary adenopathy.       Right: No supraclavicular adenopathy present.       Left: No supraclavicular adenopathy present.  Neurological: He is alert and oriented to person, place, and time.  Skin: Skin is warm and dry. No rash noted. He is not diaphoretic. No erythema. No pallor.  Psychiatric: He has a normal mood and affect. His behavior is normal. Judgment and thought content normal.  Nursing note and vitals reviewed.   Infusion on 03/29/2019  Component Date Value Ref Range Status   Sodium 03/29/2019 133* 135 - 145 mmol/L Final   Potassium 03/29/2019 4.0  3.5 - 5.1 mmol/L Final   Chloride 03/29/2019 98  98 - 111 mmol/L Final   CO2 03/29/2019 23  22 - 32 mmol/L Final   Glucose, Bld 03/29/2019 191* 70 - 99 mg/dL Final   BUN 03/29/2019 12  8 - 23 mg/dL Final   Creatinine, Ser 03/29/2019 0.82  0.61 - 1.24 mg/dL Final   Calcium 03/29/2019 9.3  8.9 - 10.3 mg/dL Final   Total Protein 03/29/2019 7.2  6.5 - 8.1 g/dL Final   Albumin 03/29/2019 3.7  3.5 - 5.0 g/dL Final   AST 03/29/2019 24  15 - 41 U/L Final   ALT 03/29/2019 14  0 - 44 U/L Final   Alkaline Phosphatase 03/29/2019 70  38 - 126 U/L Final   Total Bilirubin 03/29/2019 1.0  0.3 - 1.2 mg/dL Final   GFR calc non Af Wyvonnia Lora  03/29/2019 >60  >60  mL/min Final   GFR calc Af Amer 03/29/2019 >60  >60 mL/min Final   Anion gap 03/29/2019 12  5 - 15 Final   Performed at [facility], [address], [place], [place] [address]   WBC 03/29/2019 10.7* 4.0 - 10.5 K/uL Final   RBC 03/29/2019 3.84* 4.22 - 5.81 MIL/uL Final   Hemoglobin 03/29/2019 11.4* 13.0 - 17.0 g/dL Final   HCT 03/29/2019 34.5* 39.0 - 52.0 % Final   MCV 03/29/2019 89.8  80.0 - 100.0 fL Final   MCH 03/29/2019 29.7  26.0 - 34.0 pg Final   MCHC 03/29/2019 33.0  30.0 - 36.0 g/dL Final   RDW 03/29/2019 15.6* 11.5 - 15.5 % Final   Platelets 03/29/2019 288  150 - 400 K/uL Final   nRBC 03/29/2019 0.0  0.0 - 0.2 % Final   Neutrophils Relative % 03/29/2019 90  % Final   Neutro Abs 03/29/2019 9.6* 1.7 - 7.7 K/uL Final   Lymphocytes Relative 03/29/2019 5  % Final   Lymphs Abs 03/29/2019 0.6* 0.7 - 4.0 K/uL Final   Monocytes Relative 03/29/2019 4  % Final   Monocytes Absolute 03/29/2019 0.5  0.1 - 1.0 K/uL Final   Eosinophils Relative 03/29/2019 0  % Final   Eosinophils Absolute 03/29/2019 0.0  0.0 - 0.5 K/uL Final   Basophils Relative 03/29/2019 0  % Final   Basophils Absolute 03/29/2019 0.0  0.0 - 0.1 K/uL Final   Immature Granulocytes 03/29/2019 1  % Final   Abs Immature Granulocytes 03/29/2019 0.07  0.00 - 0.07 K/uL Final   Performed at [facility], [address], [place], [place] [address]    Assessment:  Jimmy West is a 68 y.o. male with metastatichigh-grade adenocarcinomaof the right lungs/p CT-guided biopsy of a RLL lung mass on 05/11/2018. Pathologyrevealed an invasive high-grade adenocarcinoma, with predominantly solid growth pattern. The neoplastic cells were TTF-1 (+), Napsin A (+), and P40 (+). He has a T4 vertebral metastasis. Clinical stage is T4N1M1.  There was not enough material for [facility] One testing. PDL-1revealed TPS 90%.  PET scanon 04/30/2018 revealed a 3.4 cm hypermetabolic RLL  pulmonary mass (SUV 13.2), 11 mm pulmonary nodule in the LEFT lung (SUV 4.5), RIGHT paratracheal lymph node (SUV 5.1), and hypermetabolic activity within the T4 vertebral body (SUV 10.2).   Thoracic spine MRIon 05/03/2018 revealed T4 metastasiswith a 40% pathologic compression deformity and 5 mm of retropulsion of the vertebral body. Retropulsion results in mild spinal canal stenosis and mild bilateral C4-5 foraminal stenosis. There was paravertebral soft tissue thickening from mid T3 to mid T5, likely representing edema related to the pathologic compression deformity vs. possible extraosseous extension of the neoplasm. There were no additional thoracic spinal metastases noted.   Head MRIon 05/03/2018 revealed no intracranial metastatic disease. 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 Incidental mention made of mild paranasal sinus disease.  Hereceived 11cycles ofpembrolizumab(05/24/2018 - 02/07/2019). He toleratedtreatment well. CEAwas 1.3 on 08/30/2018.LDHwas 165 on 11/29/2018.  He completed T4 radiationon 07/07/2018. He receives Xgevamonthly (06/03/2018 - 01/10/2019).  He received 1 cycle of carboplatin, Alimta, and pembrolizumabon 02/08/2019. Cycle #1 was complicated by nausea, vomiting, and diarrhea necessitating hospitalization.Decision made to hold carboplatin with his second dose.He iss/pcycle #2Alimta and pembrolizumab (02/28/2019).Cycle #2 was complicated by nausea, vomiting, and dehydration requring fluids  in clinic and an overnight stay in the hospital.  He hascancer-related painin T4. He is currently taking Fentanyl75 mcg/hr and oxycodone10 mg every 4 hours prn.  Cervical and thoracic spine CT at [facility]/10/2019 revealed unchanged severe compression deformity/vertebral  plana of T4 with a stable degree of retropulsion and focal mild spinal canal stenosis. There was interval enlargement of a right lower lobe pulmonary mass(4.5 x 3.5 cm compared to 3.9 x 2.9 cm)with redemonstrated spiculated pulmonary nodules.  Chest, abdomen, pelvisCTon 01/28/2019 revealed slight interval enlargement of a right lower lobe mass with central necrosis measuring 4.6 x 3.4 cm, previously 4.0 x 3.0 cm. There was no change in right upper lobe nodules(1.4 and 0.8 cm).Therewas almost no residua of left lung nodules, with irregular opacities in the apical left upper lobeandsuperior segment left lower lobe. There was no change in right hilar soft tissue and lymph nodes.There wasvertebra plana deformity of T4andno evidence of new osseous metastatic disease. No evidence of distant metastatic disease in the abdomen or pelvis.The1.6 x 1.1 cmleft adrenal nodule(non-metabolic on prior PET scan),was unchanged.  He had transient renal insufficiency on 03/23/2019.  Creatinine was 1.32 (baseline 0.61-1.0).  Renal ultrasound on 03/24/2019 revealed no acute abnormality identified. There was no hydronephrosis or bladder distention.  Creatinine is 0.82 today.  Symptomatically, he feels good.  He has slight discomfort at T4.  He is working.  Exam is stable.  Creatinine is 0.82.  Plan: 1.  Labs today:CBC with diff, CMP. 2.Metastatic high-grade adenocarcinoma the RIGHT lung He is s/p 11 cycles of pembrolizumab. He struggled since his first cycle of chemotherapy (as compared to immunotherapy alone)             Carboplatin, Alimta + pembrolizumab (02/08/2019) resulting in admission             Alimta + pembrolizumab (02/28/2019) resulting in admission. Treatment without carboplatin was less problematic. Patient ready for chemotherapy today.  Labs reviewed.    Steroid premedications taken yesterday.  Patient received B12 last week and continues daily folic acid. Cycle #3 pembrolizumab  and Alimta today.  Discuss daily IVF this week to manage dehydration and nausea.             Discuss plan for slowly adding back carboplatin with next cycle. 3.Renal insufficiency, resolved             Creatinine 0.82 today (baseline).  Creatinine was 1.32 (CrCl 48.3 ml/min) on 03/23/2019.              Renal ultrasound was negative last week.  Urinalysis confirmed dehydration.             Last CT scan with contrast on 02/11/2019.             Doubt renal insufficiency was related to pembrolizumab.             Continue to monitor. 4.   Bone metastasis             [facility]dress need to follow-up with [facility] regarding T4 lesion.             Discuss plan for Xgeva at nadir assessment. 5.   Cancer related pain             Pain remains well controlled on current rehimen.             Continue Fentanyl 75 mcg patch and oxycodone 10 mg p.o. every 4 hours as needed pain.  Rx:  oxycodone 10-20 mg po  q 4 hours prn pain (dis: #90). 6.   Anxiety             Clinically doing well on low dose Xanax.  Rx:  Alprazolam 0.25 mg po q 12 hours prn anxiety. 7.  RTC daily this week for +/- IVF. 8.   RTC in 10 days for MD assessment, labs (CBC with diff, BMP), and +/- Xgeva. 9.   RTC in 3 weeks for MD assessment, labs (CBC with diff, CMP, Mg, TSH, T4), and cycle #4 carboplatin, Alimta, and pembrolizumab.  I discussed the assessment and treatment plan with the patient.  The patient was provided an opportunity to ask questions and all were answered.  The patient agreed with the plan and demonstrated an understanding of the instructions.  The patient was advised to call back if the symptoms worsen or if the condition fails to improve as anticipated.   Lequita Asal, MD, PhD    03/29/2019, 9:36 AM  I, Selena Batten, am acting as scribe for [employer]. Mike Gip, MD, PhD.  I, Tannar Broker C. Mike Gip, MD, have reviewed the above documentation for accuracy and completeness, and I agree with the above.

## 2019-03-24 NOTE — Progress Notes (Signed)
PATIENT NAME: Jimmy West DOB: [birth year] MRN: [id number]  PRIMARY CARE PROVIDER: [name], NP  RESPONSIBLE PARTY:  Acct ID - Guarantor Home Phone Work Phone Relationship Acct Type  [id number] Jimmy [phone]  Self P/F     [address], [address], [place], [place] [address]    PLAN OF CARE and INTERVENTIONS:               1.  GOALS OF CARE/ ADVANCE CARE PLANNING:  Remain at home with symptoms managed.               2.  PATIENT/CAREGIVER EDUCATION:  Education on side effects of pain meds, education on chemo side effects, education on fall precautions, reviewed meds, education on s/s of infection.               3.  DISEASE STATUS: Patient is a 68 year old patient with lung cancer with mets to the spine.  Patient currently receiving chemo.   HISTORY OF PRESENT ILLNESS: Due to the COVID-19 crisis, this visit was done via telephonic from my office and it was initiated and consent by this patient and or family.  SW and RN completed Telehealth telephonic visit with patient. Patient reports he just arrived home. Patient states he had ultrasound of kidney and bladder today. Patient states he went to cancer center to receive chemo yesterday. Patient reports he was unable to receive treatment do to his labs not being normal. Patient states he had a hospitalization this month due to dehydration and hypokalemia. Patient has received two chemotherapy treatments and been hospitalized after each treatment. Patient denies having pain at the present time. Patient remains on duragesic patch 75 micrograms and takes Oxycodone 20 mg three times a day. Patient reports he has been working approximately 4 hours every day. Patient states he was started on Xanax 0.25 mg 1 tablet twice a day for anxiety. Patient reports feeling much better since starting Xanax. Patient states he was feeling anxious having labored breathing and and NP felt he was having panic attacks. Patient reports feeling better. Patient  reports his weight is 148 lb. Patient reports after first chemo treatment he lost 10 lbs. Patient denies having any cough or shortness of breath. Patient denies suffering any falls. Patient remains able to drive himself to MD appointments as well as to work. Patient denies having any edema or open areas of skin breakdown. Nurse reviewed patient's medications with patient. Patient states he is not taking Protonix. Patient reports Elease Etienne as well as Janeann Merl at the [employer] are assisting him with finances (getting rent covered and electric bill.) Patient denies having any needs that palliative care team can assist with at the present time. Support provided to patient. Patient encouraged to contact palliative care with questions or concerns.  CODE STATUS: Full Code  ADVANCED DIRECTIVES: N MOST FORM: No PPS: 60%   PHYSICAL EXAM:   VITALS: Telehealth telephonic visit no vital signs taken  LUNGS: patient denies cough or shortness of breath  CARDIAC:  EXTREMITIES: patient denies having any edema SKIN: patient denies having any open areas of skin breakdown  NEURO: Alert and oriented and [place], RN

## 2019-03-29 ENCOUNTER — Inpatient Hospital Stay (HOSPITAL_BASED_OUTPATIENT_CLINIC_OR_DEPARTMENT_OTHER): Payer: Medicare Other | Admitting: Hematology and Oncology

## 2019-03-29 ENCOUNTER — Encounter: Payer: Self-pay | Admitting: Hematology and Oncology

## 2019-03-29 ENCOUNTER — Inpatient Hospital Stay: Payer: Medicare Other

## 2019-03-29 ENCOUNTER — Other Ambulatory Visit: Payer: Self-pay

## 2019-03-29 ENCOUNTER — Telehealth: Payer: Self-pay

## 2019-03-29 VITALS — BP 115/68 | HR 64 | Temp 96.6°F | Resp 18

## 2019-03-29 VITALS — BP 128/78 | HR 92 | Temp 98.6°F | Resp 18 | Wt 151.8 lb

## 2019-03-29 DIAGNOSIS — F419 Anxiety disorder, unspecified: Secondary | ICD-10-CM | POA: Diagnosis not present

## 2019-03-29 DIAGNOSIS — C7951 Secondary malignant neoplasm of bone: Secondary | ICD-10-CM | POA: Diagnosis not present

## 2019-03-29 DIAGNOSIS — C3431 Malignant neoplasm of lower lobe, right bronchus or lung: Secondary | ICD-10-CM

## 2019-03-29 DIAGNOSIS — E860 Dehydration: Secondary | ICD-10-CM | POA: Diagnosis not present

## 2019-03-29 DIAGNOSIS — Z5111 Encounter for antineoplastic chemotherapy: Secondary | ICD-10-CM

## 2019-03-29 DIAGNOSIS — G893 Neoplasm related pain (acute) (chronic): Secondary | ICD-10-CM

## 2019-03-29 DIAGNOSIS — C7801 Secondary malignant neoplasm of right lung: Secondary | ICD-10-CM | POA: Diagnosis not present

## 2019-03-29 DIAGNOSIS — Z5112 Encounter for antineoplastic immunotherapy: Secondary | ICD-10-CM | POA: Diagnosis not present

## 2019-03-29 DIAGNOSIS — N289 Disorder of kidney and ureter, unspecified: Secondary | ICD-10-CM | POA: Insufficient documentation

## 2019-03-29 DIAGNOSIS — C801 Malignant (primary) neoplasm, unspecified: Secondary | ICD-10-CM

## 2019-03-29 LAB — COMPREHENSIVE METABOLIC PANEL
ALT: 14 U/L (ref 0–44)
AST: 24 U/L (ref 15–41)
Albumin: 3.7 g/dL (ref 3.5–5.0)
Alkaline Phosphatase: 70 U/L (ref 38–126)
Anion gap: 12 (ref 5–15)
BUN: 12 mg/dL (ref 8–23)
CO2: 23 mmol/L (ref 22–32)
Calcium: 9.3 mg/dL (ref 8.9–10.3)
Chloride: 98 mmol/L (ref 98–111)
Creatinine, Ser: 0.82 mg/dL (ref 0.61–1.24)
GFR calc Af Amer: >60 mL/min (ref >60)
GFR calc non Af Amer: >60 mL/min (ref >60)
Glucose, Bld: 191 mg/dL — ABNORMAL HIGH (ref 70–99)
Potassium: 4 mmol/L (ref 3.5–5.1)
Sodium: 133 mmol/L — ABNORMAL LOW (ref 135–145)
Total Bilirubin: 1 mg/dL (ref 0.3–1.2)
Total Protein: 7.2 g/dL (ref 6.5–8.1)

## 2019-03-29 LAB — CBC WITH DIFFERENTIAL/PLATELET
Abs Immature Granulocytes: 0.07 10*3/uL (ref 0.00–0.07)
Basophils Absolute: 0 10*3/uL (ref 0.0–0.1)
Basophils Relative: 0 %
Eosinophils Absolute: 0 10*3/uL (ref 0.0–0.5)
Eosinophils Relative: 0 %
HCT: 34.5 % — ABNORMAL LOW (ref 39.0–52.0)
Hemoglobin: 11.4 g/dL — ABNORMAL LOW (ref 13.0–17.0)
Immature Granulocytes: 1 %
Lymphocytes Relative: 5 %
Lymphs Abs: 0.6 10*3/uL — ABNORMAL LOW (ref 0.7–4.0)
MCH: 29.7 pg (ref 26.0–34.0)
MCHC: 33 g/dL (ref 30.0–36.0)
MCV: 89.8 fL (ref 80.0–100.0)
Monocytes Absolute: 0.5 10*3/uL (ref 0.1–1.0)
Monocytes Relative: 4 %
Neutro Abs: 9.6 10*3/uL — ABNORMAL HIGH (ref 1.7–7.7)
Neutrophils Relative %: 90 %
Platelets: 288 10*3/uL (ref 150–400)
RBC: 3.84 MIL/uL — ABNORMAL LOW (ref 4.22–5.81)
RDW: 15.6 % — ABNORMAL HIGH (ref 11.5–15.5)
WBC: 10.7 10*3/uL — ABNORMAL HIGH (ref 4.0–10.5)
nRBC: 0 % (ref 0.0–0.2)

## 2019-03-29 MED ORDER — SODIUM CHLORIDE 0.9 % IV SOLN
200.0000 mg | Freq: Once | INTRAVENOUS | Status: AC
  Administered 2019-03-29: 200 mg via INTRAVENOUS
  Filled 2019-03-29: qty 8

## 2019-03-29 MED ORDER — ALPRAZOLAM 0.25 MG PO TABS: 0.2500 mg | ORAL_TABLET | Freq: Two times a day (BID) | ORAL | 0 refills | Status: DC | PRN

## 2019-03-29 MED ORDER — SODIUM CHLORIDE 0.9 % IV SOLN
500.0000 mg/m2 | Freq: Once | INTRAVENOUS | Status: AC
  Administered 2019-03-29: 900 mg via INTRAVENOUS
  Filled 2019-03-29: qty 20

## 2019-03-29 MED ORDER — PALONOSETRON HCL INJECTION 0.25 MG/5ML
0.2500 mg | Freq: Once | INTRAVENOUS | Status: AC
  Administered 2019-03-29: 0.25 mg via INTRAVENOUS

## 2019-03-29 MED ORDER — SODIUM CHLORIDE 0.9% FLUSH
10.0000 mL | INTRAVENOUS | Status: DC | PRN
  Administered 2019-03-29: 10 mL via INTRAVENOUS
  Filled 2019-03-29: qty 10

## 2019-03-29 MED ORDER — SODIUM CHLORIDE 0.9 % IV SOLN
Freq: Once | INTRAVENOUS | Status: DC
  Filled 2019-03-29: qty 5

## 2019-03-29 MED ORDER — DEXAMETHASONE SODIUM PHOSPHATE 10 MG/ML IJ SOLN
10.0000 mg | Freq: Once | INTRAMUSCULAR | Status: AC
  Administered 2019-03-29: 10 mg via INTRAVENOUS

## 2019-03-29 MED ORDER — HEPARIN SOD (PORK) LOCK FLUSH 100 UNIT/ML IV SOLN
500.0000 [IU] | Freq: Once | INTRAVENOUS | Status: AC
  Administered 2019-03-29: 500 [IU] via INTRAVENOUS

## 2019-03-29 MED ORDER — SODIUM CHLORIDE 0.9 % IV SOLN
Freq: Once | INTRAVENOUS | Status: AC
  Administered 2019-03-29: 10:00:00 via INTRAVENOUS
  Filled 2019-03-29: qty 250

## 2019-03-29 NOTE — Progress Notes (Signed)
The patient states the medication he started for anxiety has help a lot ( Xanax 0.25)

## 2019-03-30 ENCOUNTER — Inpatient Hospital Stay: Payer: Medicare Other

## 2019-03-30 ENCOUNTER — Other Ambulatory Visit: Payer: Self-pay | Admitting: *Deleted

## 2019-03-30 MED ORDER — OXYCODONE HCL 20 MG PO TABS: 0.5000 | ORAL_TABLET | ORAL | 0 refills | Status: DC | PRN

## 2019-03-30 NOTE — Telephone Encounter (Signed)
Spoke with the patient to see how is was doing the patient states he is well and feeling a lot better. No chills , fevers or SOB at this the patient states.

## 2019-03-30 NOTE — Telephone Encounter (Signed)
Pt requesting refill of oxycodone. Last fill was on 8/18 for #90 which was 15 day supply.

## 2019-03-31 ENCOUNTER — Inpatient Hospital Stay: Payer: Medicare Other

## 2019-04-01 ENCOUNTER — Inpatient Hospital Stay: Payer: Medicare Other

## 2019-04-05 ENCOUNTER — Inpatient Hospital Stay: Payer: Medicare Other | Attending: Hospice and Palliative Medicine | Admitting: Hospice and Palliative Medicine

## 2019-04-05 DIAGNOSIS — Z515 Encounter for palliative care: Secondary | ICD-10-CM

## 2019-04-05 NOTE — Progress Notes (Signed)
Telephone visit was scheduled today. I called patient at the scheduled time but he was at work. He requested that I call him back later in the day, which I did but he was again not available. Will reschedule.

## 2019-04-08 ENCOUNTER — Ambulatory Visit: Payer: Medicare Other

## 2019-04-08 ENCOUNTER — Other Ambulatory Visit: Payer: Medicare Other

## 2019-04-08 ENCOUNTER — Ambulatory Visit: Payer: Medicare Other | Admitting: Hematology and Oncology

## 2019-04-11 ENCOUNTER — Other Ambulatory Visit: Payer: Self-pay | Admitting: Hospice and Palliative Medicine

## 2019-04-11 DIAGNOSIS — F419 Anxiety disorder, unspecified: Secondary | ICD-10-CM

## 2019-04-11 MED ORDER — ALPRAZOLAM 0.25 MG PO TABS: 0.2500 mg | ORAL_TABLET | Freq: Three times a day (TID) | ORAL | 0 refills | Status: DC | PRN

## 2019-04-11 NOTE — Progress Notes (Signed)
Spoke with patient by phone. He has used the alprazolam TID through the previous treatment cycle and found that it helped control his anxiety. Will change rx to TID prn and refill (#45). PDMP reviewed.

## 2019-04-12 ENCOUNTER — Encounter: Payer: Self-pay | Admitting: Hematology and Oncology

## 2019-04-12 NOTE — Progress Notes (Signed)
[facility]  [address], [address] [place], [place] [address] Phone: [phone]  Fax: [phone]   Clinic Day:  04/13/2019  Referring physician: [name], NP  Chief Complaint: Jimmy West is a 68 y.o. male with metastatic adenocarcinoma of the lung who is seen for assessment prior to monthly Xgeva.  HPI: The patient was last seen in the medical oncology clinic on 03/29/2019. At that time, he felt good.  He had slight discomfort at T4.  He was working.  Exam was stable.  Creatinine was 0.82.  He received cycle #3 pembrolizumab and Alimta.  He was scheduled to return daily for possible IVF and management of nausea.  He continued Xanax for his anxiety.  During the interim, he has felt much better. He continues to drink lots of water and eat a well balanced diet.  He has not required IVF or IV antiemetics in clinic.  Back pain is stable.   Past Medical History:  Diagnosis Date  . Bulging lumbar disc   . Cancer ([place])    stage 4 lung cancer  . Heart attack [facility])     Past Surgical History:  Procedure Laterality Date  . CARDIAC CATHETERIZATION     two stents  . KNEE SURGERY Left   . PORTA CATH INSERTION N/A 05/21/2018   Procedure: PORTA CATH INSERTION;  Surgeon: Algernon Huxley, MD;  Location: [facility];  Service: Cardiovascular;  Laterality: N/A;    Family History  Problem Relation Age of Onset  . Heart failure Father   . Cancer Maternal Aunt   . Heart failure Maternal Uncle   . Dementia Paternal Grandmother   . Cancer Maternal Aunt     Social History:  reports that he quit smoking about 15 years ago. His smoking use included cigarettes. He has a 22.50 pack-year smoking history. He has never used smokeless tobacco. He reports current alcohol use of about 15.0 standard drinks of alcohol per week. He reports that he does not use drugs. He started smoking at age 36. He is smoking 1 1/2 packs/day. Patient denies known exposures  to radiation ortoxins. 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 The patient is alone today.  Allergies: No Known Allergies  Current Medications: Current Outpatient Medications  Medication Sig Dispense Refill  . ALPRAZolam (XANAX) 0.25 MG tablet Take 1 tablet (0.25 mg total) by mouth 3 (three) times daily as needed for anxiety. 45 tablet 0  . aspirin EC 81 MG tablet Take 81 mg by mouth daily.    . Calcium 600-400 MG-UNIT CHEW Chew 2 tablets by mouth daily.    . fentaNYL (DURAGESIC) 75 MCG/HR Place 1 patch onto the skin every 3 (three) days. 10 patch 0  . FLUoxetine (PROZAC) 40 MG capsule Take 1 capsule (40 mg total) by mouth daily. 30 capsule 3  . folic acid (FOLVITE) 1 MG tablet Take 1 tablet (1 mg total) by mouth daily. Start 5-7 days before Alimta chemotherapy. Continue until 21 days after Alimta completed. 100 tablet 3  . Oxycodone HCl 20 MG TABS Take 0.5-1 tablets (10-20 mg total) by mouth every 4 (four) hours as needed (for severe pain). 90 tablet 0  . pantoprazole (PROTONIX) 40 MG tablet Take 1 tablet (40 mg total) by mouth daily. 30 tablet 1  . acetaminophen (TYLENOL) 500 MG tablet Take 500 mg by mouth 3 (three) times daily as needed.    Marland Kitchen  dexamethasone (DECADRON) 4 MG tablet Take 1 tab two times a day the day before Alimta chemo, then take 2 tabs once a day for 3 days starting the day after chemo. (Patient not taking: Reported on 04/12/2019) 30 tablet 1  . ibuprofen (ADVIL,MOTRIN) 200 MG tablet Take 200 mg by mouth every 6 (six) hours as needed.    . ondansetron (ZOFRAN ODT) 4 MG disintegrating tablet Take 1 tablet (4 mg total) by mouth every 6 (six) hours as needed for nausea or vomiting. (Patient not taking: Reported on 03/29/2019) 30 tablet 1  . polyethylene glycol (MIRALAX / GLYCOLAX) packet Take 17 g by mouth daily.    Marland Kitchen senna (SENOKOT) 8.6 MG tablet Take 1 tablet by mouth as needed.      No current  facility-administered medications for this visit.    Facility-Administered Medications Ordered in Other Visits  Medication Dose Route Frequency Provider Last Rate Last Dose  . 0.9 %  sodium chloride infusion   Intravenous Once ,  C, MD      . 0.9 %  sodium chloride infusion   Intravenous Once PRN ,  C, MD      . albuterol (PROVENTIL) (2.5 MG/3ML) 0.083% nebulizer solution 2.5 mg  2.5 mg Nebulization Once PRN ,  C, MD      . alteplase (CATHFLO ACTIVASE) injection 2 mg  2 mg Intracatheter Once PRN Lequita Asal, MD      . EPINEPHrine (ADRENALIN) 1 MG/10ML injection 0.25 mg  0.25 mg Intravenous Once PRN ,  C, MD      . EPINEPHrine (ADRENALIN) 1 MG/10ML injection 0.25 mg  0.25 mg Intravenous Once PRN ,  C, MD      . heparin lock flush 100 unit/mL  500 Units Intracatheter Once PRN Mike Gip,  C, MD      . heparin lock flush 100 unit/mL  250 Units Intracatheter Once PRN Nolon Stalls C, MD      . sodium chloride flush (NS) 0.9 % injection 10 mL  10 mL Intravenous PRN Nolon Stalls C, MD   10 mL at 03/04/19 0959  . sodium chloride flush (NS) 0.9 % injection 10 mL  10 mL Intracatheter Once PRN ,  C, MD      . sodium chloride flush (NS) 0.9 % injection 3 mL  3 mL Intracatheter Once PRN Lequita Asal, MD        Review of Systems  Constitutional: Positive for weight loss (down 2 lbs). Negative for chills, diaphoresis, fever and malaise/fatigue.       Feels "fine". "No problems".  HENT: Negative.  Negative for congestion, ear pain, hearing loss, nosebleeds, sinus pain, sore throat and tinnitus.   Eyes: Negative.  Negative for blurred vision, double vision and photophobia.  Respiratory: Negative.  Negative for cough, hemoptysis, sputum production and shortness of breath.   Cardiovascular: Negative.  Negative for chest pain, palpitations and leg swelling.  Gastrointestinal: Negative.   Negative for abdominal pain, blood in stool, constipation, diarrhea, melena, nausea and vomiting.  Genitourinary: Negative.  Negative for dysuria, frequency, hematuria and urgency.  Musculoskeletal: Positive for back pain (slight "nerve pain" at T4). Negative for joint pain, myalgias and neck pain.  Skin: Negative.  Negative for itching and rash.  Neurological: Negative.  Negative for dizziness, tingling (mid back, occasional), tremors, sensory change, speech change, focal weakness, weakness and headaches.  Endo/Heme/Allergies: Negative.  Does not bruise/bleed easily.  Psychiatric/Behavioral: Negative.  Negative for depression and memory loss. The patient  is not nervous/anxious (Xanax helping) and does not have insomnia.   All other systems reviewed and are negative.  Performance status (ECOG):  1  Vitals Blood pressure 98/60, pulse 94, temperature 98.7 F (37.1 C), temperature source Tympanic, resp. rate 16, weight 149 lb 7.6 oz (67.8 kg), SpO2 97 %.  Physical Exam  Constitutional: He is oriented to person, place, and time. He appears well-developed and well-nourished. No distress.  HENT:  Head: Normocephalic and atraumatic.  Mouth/Throat: Oropharynx is clear and moist. No oropharyngeal exudate.  Brown hair slight graying.  Mustache.  Dentures.  Mask.  Eyes: Pupils are equal, round, and reactive to light. Conjunctivae and EOM are normal. No scleral icterus.  Glasses. Gray blue eyes.  Neck: Normal range of motion. Neck supple. No JVD present.  Cardiovascular: Normal rate, regular rhythm and normal heart sounds.  No murmur heard. Pulmonary/Chest: Effort normal and breath sounds normal. No respiratory distress. He has no wheezes. He has no rales.  Abdominal: Soft. Bowel sounds are normal. He exhibits no distension and no mass. There is no abdominal tenderness. There is no rebound and no guarding.  Musculoskeletal: Normal range of motion.        General: No tenderness or edema.      Comments: Slight pain on palpation at T4 (stable).  Lymphadenopathy:    He has no cervical adenopathy.    He has no axillary adenopathy.       Right: No supraclavicular adenopathy present.       Left: No supraclavicular adenopathy present.  Neurological: He is alert and oriented to person, place, and time.  Skin: Skin is warm and dry. No rash noted. He is not diaphoretic. No erythema. No pallor.  Psychiatric: He has a normal mood and affect. His behavior is normal. Judgment and thought content normal.  Nursing note and vitals reviewed.   Appointment on 04/13/2019  Component Date Value Ref Range Status  . Magnesium 04/13/2019 2.0  1.7 - 2.4 mg/dL Final   Performed at [facility], [address] [place], [place] [address]  . Sodium 04/13/2019 136  135 - 145 mmol/L Final  . Potassium 04/13/2019 3.9  3.5 - 5.1 mmol/L Final  . Chloride 04/13/2019 103  98 - 111 mmol/L Final  . CO2 04/13/2019 22  22 - 32 mmol/L Final  . Glucose, Bld 04/13/2019 124* 70 - 99 mg/dL Final  . BUN 04/13/2019 10  8 - 23 mg/dL Final  . Creatinine, Ser 04/13/2019 1.21  0.61 - 1.24 mg/dL Final  . Calcium 04/13/2019 9.3  8.9 - 10.3 mg/dL Final  . Total Protein 04/13/2019 7.0  6.5 - 8.1 g/dL Final  . Albumin 04/13/2019 3.6  3.5 - 5.0 g/dL Final  . AST 04/13/2019 22  15 - 41 U/L Final  . ALT 04/13/2019 12  0 - 44 U/L Final  . Alkaline Phosphatase 04/13/2019 67  38 - 126 U/L Final  . Total Bilirubin 04/13/2019 0.8  0.3 - 1.2 mg/dL Final  . GFR calc non Af Amer 04/13/2019 >60  >60 mL/min Final  . GFR calc Af Amer 04/13/2019 >60  >60 mL/min Final  . Anion gap 04/13/2019 11  5 - 15 Final   Performed at [facility], [address], [place], [place] [address]  . WBC 04/13/2019 7.6  4.0 - 10.5 K/uL Final  . RBC 04/13/2019 3.37* 4.22 - 5.81 MIL/uL Final  . Hemoglobin 04/13/2019 10.1* 13.0 - 17.0 g/dL Final  . HCT 04/13/2019  30.5* 39.0 - 52.0 % Final  . MCV 04/13/2019 90.5  80.0 - 100.0 fL Final   . MCH 04/13/2019 30.0  26.0 - 34.0 pg Final  . MCHC 04/13/2019 33.1  30.0 - 36.0 g/dL Final  . RDW 04/13/2019 15.6* 11.5 - 15.5 % Final  . Platelets 04/13/2019 246  150 - 400 K/uL Final  . nRBC 04/13/2019 0.0  0.0 - 0.2 % Final  . Neutrophils Relative % 04/13/2019 69  % Final  . Neutro Abs 04/13/2019 5.2  1.7 - 7.7 K/uL Final  . Lymphocytes Relative 04/13/2019 11  % Final  . Lymphs Abs 04/13/2019 0.9  0.7 - 4.0 K/uL Final  . Monocytes Relative 04/13/2019 11  % Final  . Monocytes Absolute 04/13/2019 0.9  0.1 - 1.0 K/uL Final  . Eosinophils Relative 04/13/2019 8  % Final  . Eosinophils Absolute 04/13/2019 0.6* 0.0 - 0.5 K/uL Final  . Basophils Relative 04/13/2019 0  % Final  . Basophils Absolute 04/13/2019 0.0  0.0 - 0.1 K/uL Final  . Immature Granulocytes 04/13/2019 1  % Final  . Abs Immature Granulocytes 04/13/2019 0.05  0.00 - 0.07 K/uL Final   Performed at [facility], [address], [place], [place] [address]    Assessment:  Jimmy West is a 68 y.o. male with metastatichigh-grade adenocarcinomaof the right lungs/p CT-guided biopsy of a RLL lung mass on 05/11/2018. Pathologyrevealed an invasive high-grade adenocarcinoma, with predominantly solid growth pattern. The neoplastic cells were TTF-1 (+), Napsin A (+), and P40 (+). He has a T4 vertebral metastasis. Clinical stage is T4N1M1.  There was not enough material for [facility] One testing. PDL-1revealed TPS 90%.  PET scanon 04/30/2018 revealed a 3.4 cm hypermetabolic RLL pulmonary mass (SUV 13.2), 11 mm pulmonary nodule in the LEFT lung (SUV 4.5), RIGHT paratracheal lymph node (SUV 5.1), and hypermetabolic activity within the T4 vertebral body (SUV 10.2).   Thoracic spine MRIon 05/03/2018 revealed T4 metastasiswith a 40% pathologic compression deformity and 5 mm of retropulsion of the vertebral body. Retropulsion results in mild spinal canal stenosis and mild bilateral C4-5 foraminal stenosis.  There was paravertebral soft tissue thickening from mid T3 to mid T5, likely representing edema related to the pathologic compression deformity vs. possible extraosseous extension of the neoplasm. There were no additional thoracic spinal metastases noted.   Head MRIon 05/03/2018 revealed no intracranial metastatic disease. 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 Incidental mention made of mild paranasal sinus disease.  Hereceived 11cycles ofpembrolizumab(05/24/2018 - 02/07/2019). He toleratedtreatment well. CEAwas 1.3 on 08/30/2018.LDHwas 165 on 11/29/2018.  He completed T4 radiationon 07/07/2018. He receives Xgevamonthly (06/03/2018 - 01/10/2019).  He received 1 cycle of carboplatin, Alimta, and pembrolizumabon 02/08/2019. Cycle #1 was complicated by nausea, vomiting, and diarrhea necessitating hospitalization.Decision made to hold carboplatin with his second dose.He iss/pcycle #2Alimta and pembrolizumab (02/28/2019).Cycle #2 was complicated by nausea, vomiting, and dehydration requring fluids in clinic and an overnight stay in the hospital.  He hascancer-related painin T4. He is currently taking Fentanyl75 mcg/hr and oxycodone10 mg every 4 hours prn.  Cervical and thoracic spine CT at [facility]/10/2019 revealed unchanged severe compression deformity/vertebral plana of T4 with a stable degree of retropulsion and focal mild spinal canal stenosis. There was interval enlargement of a right lower lobe pulmonary mass(4.5 x 3.5 cm compared to 3.9 x 2.9 cm)with redemonstrated  spiculated pulmonary nodules.  Chest, abdomen, pelvisCTon 01/28/2019 revealed slight interval enlargement of a right lower lobe mass with central necrosis measuring 4.6 x 3.4 cm, previously 4.0 x 3.0 cm. There was no  change in right upper lobe nodules(1.4 and 0.8 cm).Therewas almost no residua of left lung nodules, with irregular opacities in the apical left upper lobeandsuperior segment left lower lobe. There was no change in right hilar soft tissue and lymph nodes.There wasvertebra plana deformity of T4andno evidence of new osseous metastatic disease. No evidence of distant metastatic disease in the abdomen or pelvis.The1.6 x 1.1 cmleft adrenal nodule(non-metabolic on prior PET scan),was unchanged.  He had transient renal insufficiency on 03/23/2019.  Creatinine was 1.32 (baseline 0.61-1.0).  Renal ultrasound on 03/24/2019 revealed no acute abnormality identified.There was no hydronephrosis or bladder distention.  Creatinine is 0.82 today.  Symptomatically, he is doing well.  He voices no concerns.  Exam is stable.  Plan: 1.Labs today:CBC with diff, CMP. 2.Metastatic high-grade adenocarcinoma the RIGHT lung He is s/p 11 cycles of pembrolizumab. He struggled since his first cycle of chemotherapy (as compared to immunotherapy alone) Carboplatin, Alimta + pembrolizumab (02/08/2019) resulting in admission Alimta + pembrolizumab (02/28/2019) resulting in admission. Treatment without carboplatin has been less problematic.  He required no IVF with the last cycle. He is day 16 s/p cycle #3 Alimta and pembrolizumab. Discuss plan for adding back carboplatin slowly with his chemotherapy.  Patient to consider prior to next cycle. 3.Renal insufficiency, resolved Creatinine 0.90 today (baseline). Continue to monitor. 4. Bone metastasis Encourage patient to follow-up with [facility] regarding T4 lesion. Xgeva today.    Continue calcium and vitamin D. 5. Cancer related pain Pain is well controlled. Continue Fentanyl 75 mcg patch and oxycodone 10 mg p.o. every 4 hours as needed pain. 6. Anxiety  Clinically he has done much better with the last cycle likely secondary to low-dose Xanax. 7.  RTC on 04/18/2019 for MD assessment, labs, and cycle #4 carboplatin, Alimta, and pembrolizumab.  I discussed the assessment and treatment plan with the patient.  The patient was provided an opportunity to ask questions and all were answered.  The patient agreed with the plan and demonstrated an understanding of the instructions.  The patient was advised to call back if the symptoms worsen or if the condition fails to improve as anticipated.   Lequita Asal, MD, PhD    04/13/2019, 2:53 PM  I, Jacqualyn Posey, am acting as Education administrator for [employer]. Mike Gip, MD, PhD.  I,  C. Mike Gip, MD, have reviewed the above documentation for accuracy and completeness, and I agree with the above.

## 2019-04-12 NOTE — Progress Notes (Signed)
No new changes noted. The patient does report he is eating better. The patient name and DOB has been verified by phone.

## 2019-04-13 ENCOUNTER — Inpatient Hospital Stay: Payer: Medicare Other

## 2019-04-13 ENCOUNTER — Inpatient Hospital Stay (HOSPITAL_BASED_OUTPATIENT_CLINIC_OR_DEPARTMENT_OTHER): Payer: Medicare Other | Admitting: Hematology and Oncology

## 2019-04-13 ENCOUNTER — Other Ambulatory Visit: Payer: Medicare Other

## 2019-04-13 ENCOUNTER — Other Ambulatory Visit: Payer: Self-pay

## 2019-04-13 ENCOUNTER — Encounter: Payer: Self-pay | Admitting: Hematology and Oncology

## 2019-04-13 ENCOUNTER — Ambulatory Visit: Payer: Medicare Other | Admitting: Hematology and Oncology

## 2019-04-13 ENCOUNTER — Ambulatory Visit: Payer: Medicare Other

## 2019-04-13 VITALS — BP 98/60 | HR 94 | Temp 98.7°F | Resp 16 | Wt 149.5 lb

## 2019-04-13 DIAGNOSIS — C7951 Secondary malignant neoplasm of bone: Secondary | ICD-10-CM

## 2019-04-13 DIAGNOSIS — C3431 Malignant neoplasm of lower lobe, right bronchus or lung: Secondary | ICD-10-CM | POA: Diagnosis not present

## 2019-04-13 DIAGNOSIS — F419 Anxiety disorder, unspecified: Secondary | ICD-10-CM | POA: Diagnosis not present

## 2019-04-13 DIAGNOSIS — G893 Neoplasm related pain (acute) (chronic): Secondary | ICD-10-CM | POA: Diagnosis not present

## 2019-04-13 DIAGNOSIS — C801 Malignant (primary) neoplasm, unspecified: Secondary | ICD-10-CM

## 2019-04-13 DIAGNOSIS — C7801 Secondary malignant neoplasm of right lung: Secondary | ICD-10-CM | POA: Diagnosis not present

## 2019-04-13 DIAGNOSIS — E441 Mild protein-calorie malnutrition: Secondary | ICD-10-CM

## 2019-04-13 DIAGNOSIS — E860 Dehydration: Secondary | ICD-10-CM | POA: Diagnosis not present

## 2019-04-13 DIAGNOSIS — Z5111 Encounter for antineoplastic chemotherapy: Secondary | ICD-10-CM | POA: Diagnosis not present

## 2019-04-13 LAB — CBC WITH DIFFERENTIAL/PLATELET
Abs Immature Granulocytes: 0.05 10*3/uL (ref 0.00–0.07)
Basophils Absolute: 0 10*3/uL (ref 0.0–0.1)
Basophils Relative: 0 %
Eosinophils Absolute: 0.6 10*3/uL — ABNORMAL HIGH (ref 0.0–0.5)
Eosinophils Relative: 8 %
HCT: 30.5 % — ABNORMAL LOW (ref 39.0–52.0)
Hemoglobin: 10.1 g/dL — ABNORMAL LOW (ref 13.0–17.0)
Immature Granulocytes: 1 %
Lymphocytes Relative: 11 %
Lymphs Abs: 0.9 10*3/uL (ref 0.7–4.0)
MCH: 30 pg (ref 26.0–34.0)
MCHC: 33.1 g/dL (ref 30.0–36.0)
MCV: 90.5 fL (ref 80.0–100.0)
Monocytes Absolute: 0.9 10*3/uL (ref 0.1–1.0)
Monocytes Relative: 11 %
Neutro Abs: 5.2 10*3/uL (ref 1.7–7.7)
Neutrophils Relative %: 69 %
Platelets: 246 10*3/uL (ref 150–400)
RBC: 3.37 MIL/uL — ABNORMAL LOW (ref 4.22–5.81)
RDW: 15.6 % — ABNORMAL HIGH (ref 11.5–15.5)
WBC: 7.6 10*3/uL (ref 4.0–10.5)
nRBC: 0 % (ref 0.0–0.2)

## 2019-04-13 LAB — COMPREHENSIVE METABOLIC PANEL
ALT: 12 U/L (ref 0–44)
AST: 22 U/L (ref 15–41)
Albumin: 3.6 g/dL (ref 3.5–5.0)
Alkaline Phosphatase: 67 U/L (ref 38–126)
Anion gap: 11 (ref 5–15)
BUN: 10 mg/dL (ref 8–23)
CO2: 22 mmol/L (ref 22–32)
Calcium: 9.3 mg/dL (ref 8.9–10.3)
Chloride: 103 mmol/L (ref 98–111)
Creatinine, Ser: 1.21 mg/dL (ref 0.61–1.24)
GFR calc Af Amer: >60 mL/min (ref >60)
GFR calc non Af Amer: >60 mL/min (ref >60)
Glucose, Bld: 124 mg/dL — ABNORMAL HIGH (ref 70–99)
Potassium: 3.9 mmol/L (ref 3.5–5.1)
Sodium: 136 mmol/L (ref 135–145)
Total Bilirubin: 0.8 mg/dL (ref 0.3–1.2)
Total Protein: 7 g/dL (ref 6.5–8.1)

## 2019-04-13 LAB — TSH: TSH: 1.705 u[IU]/mL (ref 0.350–4.500)

## 2019-04-13 LAB — MAGNESIUM: Magnesium: 2 mg/dL (ref 1.7–2.4)

## 2019-04-13 MED ORDER — DENOSUMAB 120 MG/1.7ML ~~LOC~~ SOLN
120.0000 mg | Freq: Once | SUBCUTANEOUS | Status: AC
  Administered 2019-04-13: 120 mg via SUBCUTANEOUS

## 2019-04-13 NOTE — Patient Instructions (Signed)
Denosumab injection What is this medicine? DENOSUMAB (den oh sue mab) slows bone breakdown. Prolia is used to treat osteoporosis in women after menopause and in men, and in people who are taking corticosteroids for 6 months or more. Xgeva is used to treat a high calcium level due to cancer and to prevent bone fractures and other bone problems caused by multiple myeloma or cancer bone metastases. Xgeva is also used to treat giant cell tumor of the bone. This medicine may be used for other purposes; ask your health care provider or pharmacist if you have questions. COMMON BRAND NAME(S): Prolia, XGEVA What should I tell my health care provider before I take this medicine? They need to know if you have any of these conditions:  dental disease  having surgery or tooth extraction  infection  kidney disease  low levels of calcium or Vitamin D in the blood  malnutrition  on hemodialysis  skin conditions or sensitivity  thyroid or parathyroid disease  an unusual reaction to denosumab, other medicines, foods, dyes, or preservatives  pregnant or trying to get pregnant  breast-feeding How should I use this medicine? This medicine is for injection under the skin. It is given by a health care professional in a hospital or clinic setting. A special MedGuide will be given to you before each treatment. Be sure to read this information carefully each time. For Prolia, talk to your pediatrician regarding the use of this medicine in children. Special care may be needed. For Xgeva, talk to your pediatrician regarding the use of this medicine in children. While this drug may be prescribed for children as young as 13 years for selected conditions, precautions do apply. Overdosage: If you think you have taken too much of this medicine contact a poison control center or emergency room at once. NOTE: This medicine is only for you. Do not share this medicine with others. What if I miss a dose? It is  important not to miss your dose. Call your doctor or health care professional if you are unable to keep an appointment. What may interact with this medicine? Do not take this medicine with any of the following medications:  other medicines containing denosumab This medicine may also interact with the following medications:  medicines that lower your chance of fighting infection  steroid medicines like prednisone or cortisone This list may not describe all possible interactions. Give your health care provider a list of all the medicines, herbs, non-prescription drugs, or dietary supplements you use. Also tell them if you smoke, drink alcohol, or use illegal drugs. Some items may interact with your medicine. What should I watch for while using this medicine? Visit your doctor or health care professional for regular checks on your progress. Your doctor or health care professional may order blood tests and other tests to see how you are doing. Call your doctor or health care professional for advice if you get a fever, chills or sore throat, or other symptoms of a cold or flu. Do not treat yourself. This drug may decrease your body's ability to fight infection. Try to avoid being around people who are sick. You should make sure you get enough calcium and vitamin D while you are taking this medicine, unless your doctor tells you not to. Discuss the foods you eat and the vitamins you take with your health care professional. See your dentist regularly. Brush and floss your teeth as directed. Before you have any dental work done, tell your dentist you are   receiving this medicine. Do not become pregnant while taking this medicine or for 5 months after stopping it. Talk with your doctor or health care professional about your birth control options while taking this medicine. Women should inform their doctor if they wish to become pregnant or think they might be pregnant. There is a potential for serious side  effects to an unborn child. Talk to your health care professional or pharmacist for more information. What side effects may I notice from receiving this medicine? Side effects that you should report to your doctor or health care professional as soon as possible:  allergic reactions like skin rash, itching or hives, swelling of the face, lips, or tongue  bone pain  breathing problems  dizziness  jaw pain, especially after dental work  redness, blistering, peeling of the skin  signs and symptoms of infection like fever or chills; cough; sore throat; pain or trouble passing urine  signs of low calcium like fast heartbeat, muscle cramps or muscle pain; pain, tingling, numbness in the hands or feet; seizures  unusual bleeding or bruising  unusually weak or tired Side effects that usually do not require medical attention (report to your doctor or health care professional if they continue or are bothersome):  constipation  diarrhea  headache  joint pain  loss of appetite  muscle pain  runny nose  tiredness  upset stomach This list may not describe all possible side effects. Call your doctor for medical advice about side effects. You may report side effects to FDA at 1-800-FDA-1088. Where should I keep my medicine? This medicine is only given in a clinic, doctor's office, or other health care setting and will not be stored at home. NOTE: This sheet is a summary. It may not cover all possible information. If you have questions about this medicine, talk to your doctor, pharmacist, or health care provider.  2020 Elsevier/Gold Standard (2017-11-13 16:10:44)

## 2019-04-14 LAB — T4: T4, Total: 9.9 ug/dL (ref 4.5–12.0)

## 2019-04-17 NOTE — Progress Notes (Signed)
[facility]  [address] [place], [place] [address] Phone: [phone]  Fax: [phone]   Clinic Day:  04/18/2019  Referring physician: [name], NP  Chief Complaint: Jimmy West is a 68 y.o. male with metastatic adenocarcinoma of the lung who is seen for assessment and treatment on day 1 of cycle #4 carboplatin, Alimta, and pembrolizumab.  HPI: The patient was last seen in the medical oncology clinic on 04/13/2019. At that time, he was day 10 of cycle #3.  He was feeling much better. He continued to drink lots of water and eat a well balanced diet. Exam was stable.  Counts included a hematocrit of 30.5, hemoglobin 10.1, MCV 90.5, platelets 246,000, WBC 7600 with an ANC of 5200.  He continued Xanax for his anxiety.  He received Xgeva.  During the interim, he has been feeling well. He denies any changes. His appetite is good. He has no prohibitive toxicities from continuing pembrolizumab and Alimta.at this time. He was able to get into contact with a nurse navigator at [facility].    Past Medical History:  Diagnosis Date  . Bulging lumbar disc   . Cancer ([place])    stage 4 lung cancer  . Heart attack [facility])     Past Surgical History:  Procedure Laterality Date  . CARDIAC CATHETERIZATION     two stents  . KNEE SURGERY Left   . PORTA CATH INSERTION N/A 05/21/2018   Procedure: PORTA CATH INSERTION;  Surgeon: Algernon Huxley, MD;  Location: [facility];  Service: Cardiovascular;  Laterality: N/A;    Family History  Problem Relation Age of Onset  . Heart failure Father   . Cancer Maternal Aunt   . Heart failure Maternal Uncle   . Dementia Paternal Grandmother   . Cancer Maternal Aunt     Social History:  reports that he quit smoking about 15 years ago. His smoking use included cigarettes. He has a 22.50 pack-year smoking history. He has never used smokeless tobacco. He reports current alcohol use of about 15.0 standard drinks  of alcohol per week. He reports that he does not use drugs. He started smoking at age 48. He is smoking 1 1/2 packs/day. Patient denies known exposures to radiation ortoxins. 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  The patient is alone today.  Allergies: No Known Allergies  Current Medications: Current Outpatient Medications  Medication Sig Dispense Refill  . acetaminophen (TYLENOL) 500 MG tablet Take 500 mg by mouth 3 (three) times daily as needed.    . ALPRAZolam (XANAX) 0.25 MG tablet Take 1 tablet (0.25 mg total) by mouth 3 (three) times daily as needed for anxiety. 45 tablet 0  . aspirin EC 81 MG tablet Take 81 mg by mouth daily.    . Calcium 600-400 MG-UNIT CHEW Chew 2 tablets by mouth daily.    Marland Kitchen dexamethasone (DECADRON) 4 MG tablet Take 1 tab two times a day the day before Alimta chemo, then take 2 tabs once a day for 3 days starting the day after chemo. 30 tablet 1  . fentaNYL (DURAGESIC) 75 MCG/HR Place 1 patch onto the skin every 3 (three) days. 10 patch 0  . FLUoxetine (PROZAC) 40 MG capsule Take 1 capsule (40 mg total) by mouth daily. 30 capsule 3  . folic acid (FOLVITE) 1 MG tablet Take 1 tablet (1 mg total) by mouth daily. Start 5-7 days  before Alimta chemotherapy. Continue until 21 days after Alimta completed. 100 tablet 3  . ibuprofen (ADVIL,MOTRIN) 200 MG tablet Take 200 mg by mouth every 6 (six) hours as needed.    . Oxycodone HCl 20 MG TABS Take 0.5-1 tablets (10-20 mg total) by mouth every 4 (four) hours as needed (for severe pain). 90 tablet 0  . pantoprazole (PROTONIX) 40 MG tablet Take 1 tablet (40 mg total) by mouth daily. 30 tablet 1  . polyethylene glycol (MIRALAX / GLYCOLAX) packet Take 17 g by mouth daily.    Marland Kitchen senna (SENOKOT) 8.6 MG tablet Take 1 tablet by mouth as needed.     . ondansetron (ZOFRAN ODT) 4 MG disintegrating tablet Take 1 tablet (4 mg total) by mouth every 6 (six) hours as needed  for nausea or vomiting. (Patient not taking: Reported on 03/29/2019) 30 tablet 1   No current facility-administered medications for this visit.    Facility-Administered Medications Ordered in Other Visits  Medication Dose Route Frequency Provider Last Rate Last Dose  . 0.9 %  sodium chloride infusion   Intravenous Once Corcoran, Melissa C, MD      . 0.9 %  sodium chloride infusion   Intravenous Once PRN Corcoran, Melissa C, MD      . albuterol (PROVENTIL) (2.5 MG/3ML) 0.083% nebulizer solution 2.5 mg  2.5 mg Nebulization Once PRN Corcoran, Melissa C, MD      . alteplase (CATHFLO ACTIVASE) injection 2 mg  2 mg Intracatheter Once PRN Lequita Asal, MD      . EPINEPHrine (ADRENALIN) 1 MG/10ML injection 0.25 mg  0.25 mg Intravenous Once PRN Corcoran, Melissa C, MD      . EPINEPHrine (ADRENALIN) 1 MG/10ML injection 0.25 mg  0.25 mg Intravenous Once PRN Corcoran, Melissa C, MD      . heparin lock flush 100 unit/mL  500 Units Intracatheter Once PRN Nolon Stalls C, MD      . heparin lock flush 100 unit/mL  250 Units Intracatheter Once PRN Nolon Stalls C, MD      . heparin lock flush 100 unit/mL  500 Units Intravenous Once Corcoran, Melissa C, MD      . sodium chloride flush (NS) 0.9 % injection 10 mL  10 mL Intravenous PRN Nolon Stalls C, MD   10 mL at 03/04/19 0959  . sodium chloride flush (NS) 0.9 % injection 10 mL  10 mL Intracatheter Once PRN Nolon Stalls C, MD      . sodium chloride flush (NS) 0.9 % injection 10 mL  10 mL Intravenous PRN Lequita Asal, MD   10 mL at 04/18/19 0844  . sodium chloride flush (NS) 0.9 % injection 3 mL  3 mL Intracatheter Once PRN Lequita Asal, MD        Review of Systems  Constitutional: Positive for weight loss (1 lb ). Negative for chills, diaphoresis, fever and malaise/fatigue.       Feels "good".  HENT: Negative for congestion, ear pain, nosebleeds, sinus pain and sore throat.   Eyes: Negative.  Negative for blurred vision  and double vision.  Respiratory: Negative for cough, sputum production and shortness of breath.   Cardiovascular: Negative.  Negative for chest pain, palpitations, orthopnea, leg swelling and PND.  Gastrointestinal: Negative.  Negative for abdominal pain, blood in stool, constipation, diarrhea, melena, nausea and vomiting.  Genitourinary: Negative.  Negative for dysuria, frequency, hematuria and urgency.  Musculoskeletal: Positive for back pain (feels "ok"). Negative for joint pain, myalgias  and neck pain.  Skin: Negative for rash.  Neurological: Negative for dizziness, tingling, sensory change, speech change, focal weakness and weakness.  Endo/Heme/Allergies: Negative.  Does not bruise/bleed easily.  Psychiatric/Behavioral: Negative.  Negative for depression and memory loss. The patient is not nervous/anxious (on Xanax) and does not have insomnia.    Performance status (ECOG): 1  Vitals Blood pressure 120/79, pulse 84, temperature (!) 97 F (36.1 C), temperature source Tympanic, resp. rate 16, weight 148 lb 9.4 oz (67.4 kg), SpO2 99 %.   Physical Exam  Constitutional: He is oriented to person, place, and time. He appears well-developed and well-nourished. No distress.  HENT:  Head: Normocephalic and atraumatic.  Mouth/Throat: Oropharynx is clear and moist. No oropharyngeal exudate.  Brown hair with graying.  Mustache.  Dentures.  Mask.  Eyes: Pupils are equal, round, and reactive to light. Conjunctivae and EOM are normal. No scleral icterus.  Glasses.  Gray blue eyes.  Neck: Normal range of motion. Neck supple. No JVD present.  Cardiovascular: Normal rate and regular rhythm. Exam reveals no gallop and no friction rub.  No murmur heard. Pulmonary/Chest: Effort normal and breath sounds normal. No respiratory distress. He has no wheezes. He has no rales.  Abdominal: Soft. Bowel sounds are normal. He exhibits no distension and no mass. There is no abdominal tenderness. There is no rebound  and no guarding.  Musculoskeletal: Normal range of motion.        General: No tenderness or edema.  Lymphadenopathy:    He has no cervical adenopathy.  Neurological: He is alert and oriented to person, place, and time.  Skin: Skin is warm. No rash noted. He is not diaphoretic. No erythema.  Psychiatric: He has a normal mood and affect. His behavior is normal. Judgment and thought content normal.    Infusion on 04/18/2019  Component Date Value Ref Range Status  . Sodium 04/18/2019 138  135 - 145 mmol/L Final  . Potassium 04/18/2019 3.9  3.5 - 5.1 mmol/L Final  . Chloride 04/18/2019 104  98 - 111 mmol/L Final  . CO2 04/18/2019 25  22 - 32 mmol/L Final  . Glucose, Bld 04/18/2019 102* 70 - 99 mg/dL Final  . BUN 04/18/2019 11  8 - 23 mg/dL Final  . Creatinine, Ser 04/18/2019 0.90  0.61 - 1.24 mg/dL Final  . Calcium 04/18/2019 9.3  8.9 - 10.3 mg/dL Final  . Total Protein 04/18/2019 7.0  6.5 - 8.1 g/dL Final  . Albumin 04/18/2019 3.6  3.5 - 5.0 g/dL Final  . AST 04/18/2019 19  15 - 41 U/L Final  . ALT 04/18/2019 11  0 - 44 U/L Final  . Alkaline Phosphatase 04/18/2019 66  38 - 126 U/L Final  . Total Bilirubin 04/18/2019 0.5  0.3 - 1.2 mg/dL Final  . GFR calc non Af Amer 04/18/2019 >60  >60 mL/min Final  . GFR calc Af Amer 04/18/2019 >60  >60 mL/min Final  . Anion gap 04/18/2019 9  5 - 15 Final   Performed at [facility], [address], [place], [place] [address]  . WBC 04/18/2019 8.6  4.0 - 10.5 K/uL Final  . RBC 04/18/2019 3.41* 4.22 - 5.81 MIL/uL Final  . Hemoglobin 04/18/2019 9.9* 13.0 - 17.0 g/dL Final  . HCT 04/18/2019 30.4* 39.0 - 52.0 % Final  . MCV 04/18/2019 89.1  80.0 - 100.0 fL Final  . MCH 04/18/2019 29.0  26.0 - 34.0 pg Final  . MCHC 04/18/2019 32.6  30.0 -  36.0 g/dL Final  . RDW 04/18/2019 14.9  11.5 - 15.5 % Final  . Platelets 04/18/2019 448* 150 - 400 K/uL Final  . nRBC 04/18/2019 0.0  0.0 - 0.2 % Final  . Neutrophils Relative % 04/18/2019 76  % Final   . Neutro Abs 04/18/2019 6.6  1.7 - 7.7 K/uL Final  . Lymphocytes Relative 04/18/2019 7  % Final  . Lymphs Abs 04/18/2019 0.6* 0.7 - 4.0 K/uL Final  . Monocytes Relative 04/18/2019 15  % Final  . Monocytes Absolute 04/18/2019 1.3* 0.1 - 1.0 K/uL Final  . Eosinophils Relative 04/18/2019 1  % Final  . Eosinophils Absolute 04/18/2019 0.1  0.0 - 0.5 K/uL Final  . Basophils Relative 04/18/2019 0  % Final  . Basophils Absolute 04/18/2019 0.0  0.0 - 0.1 K/uL Final  . Immature Granulocytes 04/18/2019 1  % Final  . Abs Immature Granulocytes 04/18/2019 0.05  0.00 - 0.07 K/uL Final   Performed at [facility], [address], [place], [place] [address]  . Magnesium 04/18/2019 2.0  1.7 - 2.4 mg/dL Final   Performed at [facility], [address], [place],  [address]    Assessment:  Jimmy West is a 68 y.o. male with metastatichigh-grade adenocarcinomaof the right lungs/p CT-guided biopsy of a RLL lung mass on 05/11/2018. Pathologyrevealed an invasive high-grade adenocarcinoma, with predominantly solid growth pattern. The neoplastic cells were TTF-1 (+), Napsin A (+), and P40 (+). He has a T4 vertebral metastasis. Clinical stage is T4N1M1.  There was not enough material for [facility] One testing. PDL-1revealed TPS 90%.  PET scanon 04/30/2018 revealed a 3.4 cm hypermetabolic RLL pulmonary mass (SUV 13.2), 11 mm pulmonary nodule in the LEFT lung (SUV 4.5), RIGHT paratracheal lymph node (SUV 5.1), and hypermetabolic activity within the T4 vertebral body (SUV 10.2).   Thoracic spine MRIon 05/03/2018 revealed T4 metastasiswith a 40% pathologic compression deformity and 5 mm of retropulsion of the vertebral body. Retropulsion results in mild spinal canal stenosis and mild bilateral C4-5 foraminal stenosis. There was paravertebral soft tissue thickening from mid T3 to mid T5, likely representing edema related to the pathologic compression deformity vs.  possible extraosseous extension of the neoplasm. There were no additional thoracic spinal metastases noted.   Head MRIon 05/03/2018 revealed no intracranial metastatic disease. 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 Incidental mention made of mild paranasal sinus disease.  Hereceived 11cycles ofpembrolizumab(05/24/2018 - 02/07/2019). He toleratedtreatment well. CEAwas 1.3 on 08/30/2018.LDHwas 165 on 11/29/2018.  He completed T4 radiationon 07/07/2018. He receives Xgevamonthly (06/03/2018 - 04/13/2019).  He received 1 cycle of carboplatin, Alimta, and pembrolizumabon 02/08/2019. Cycle #1 was complicated by nausea, vomiting, and diarrhea necessitating hospitalization.Decision made to hold carboplatin with his second dose.He iss/pcycle #3Alimta and pembrolizumab (02/28/2019 - 03/29/2019).Cycle #2 was complicated by nausea, vomiting, and dehydration requring fluids in clinic and an overnight stay in the hospital.  He hascancer-related painin T4. He is currently taking Fentanyl75 mcg/hr and oxycodone10 mg every 4 hours prn.  Cervical and thoracic spine CT [facility]/10/2019 revealed unchanged severe compression deformity/vertebral plana of T4 with a stable degree of retropulsion and focal mild spinal canal stenosis. There was interval enlargement of a right lower lobe pulmonary mass(4.5 x 3.5 cm compared to 3.9 x 2.9 cm)with redemonstrated spiculated pulmonary nodules.  Chest, abdomen, pelvisCTon 01/28/2019 revealed  slight interval enlargement of a right lower lobe mass with central necrosis measuring 4.6 x 3.4 cm, previously 4.0 x 3.0 cm. There was no change in right upper lobe nodules(1.4 and 0.8 cm).Therewas almost no residua of left lung nodules, with irregular opacities in the  apical left upper lobeandsuperior segment left lower lobe. There was no change in right hilar soft tissue and lymph nodes.There wasvertebra plana deformity of T4andno evidence of new osseous metastatic disease. No evidence of distant metastatic disease in the abdomen or pelvis.The1.6 x 1.1 cmleft adrenal nodule(non-metabolic on prior PET scan),was unchanged.  He had transient renal insufficiency on 03/23/2019.  Creatinine was 1.32 (baseline 0.61-1.0).  Renal ultrasound on 03/24/2019 revealed no acute abnormality identified.There was no hydronephrosis or bladder distention.  Creatinine is 0.82 today.  Symptomatically, he is doing well.  Exam is stable.  Plan: 1.Labs today:CBC with diff, CMP, TSH, T4. 2.Metastatic high-grade adenocarcinoma the RIGHT lung He is s/p 11 cycles of pembrolizumab. He is s/p 1 cycle of carboplatin, Almita, and pembrolizumab (02/08/2019). He is s/p 3 cycles of Alimta and pembrolizumab. He struggled since his first 2 cycles of chemotherapy (as compared to immunotherapy alone) Carboplatin, Alimta + pembrolizumab (02/08/2019) resulting in admission Alimta + pembrolizumab (02/28/2019) resulting in admission. Discuss consideration of slowly adding back carboplatin.  Patient in agreement.             Steroid premedications taken yesterday.             Patient received B12 on 03/23/2019 and continues daily folic acid. Cycle #2 carboplatin (AUC 2), Alimta, and pembrolizumab today.             Review plan for daily IVF and possible anti-emetics this week to manage dehydration and nausea.  Discuss plans for restaging CT scans. 3.Renal insufficiency, resolved Creatinine 0.90 today (baseline).  Continue to monitor. 4. Bone metastasis Patient followed up with [facility] regarding T4 lesion- no change. Last Xgeva on 04/13/2019.  Continue Xgeva monthly. 5. Cancer related pain Pain remains  well controlled on current regimen. Continue Fentanyl 75 mcg patch and oxycodone 10 mg p.o. every 4 hours as needed pain. 6. Anxiety Clinically, he is doing well on low dose Ativan. 7.  Chest, abdomen, and pelvic CT on 05/06/2019. 8.   RTC daily this week for +/- IVF. 9.   RTC in 1 week for labs (CBC with diff) and influenza vaccine. 10.   RTC on 05/09/2019 for MD assessment, labs (CBC with diff, CMP, Mg, TSH, T4), and cycle #3 carboplatin, Alimta, and pembrolizumab.  I discussed the assessment and treatment plan with the patient.  The patient was provided an opportunity to ask questions and all were answered.  The patient agreed with the plan and demonstrated an understanding of the instructions.  The patient was advised to call back if the symptoms worsen or if the condition fails to improve as anticipated.    Lequita Asal, MD, PhD    04/18/2019, 9:56 AM  I, Jacqualyn Posey, am acting as Education administrator for [employer]. Mike Gip, MD, PhD.  I, Melissa C. Mike Gip, MD, have reviewed the above documentation for accuracy and completeness, and I agree with the above.

## 2019-04-18 ENCOUNTER — Encounter: Payer: Self-pay | Admitting: Hematology and Oncology

## 2019-04-18 ENCOUNTER — Inpatient Hospital Stay: Payer: Medicare Other

## 2019-04-18 ENCOUNTER — Other Ambulatory Visit: Payer: Self-pay

## 2019-04-18 ENCOUNTER — Telehealth: Payer: Self-pay

## 2019-04-18 ENCOUNTER — Inpatient Hospital Stay (HOSPITAL_BASED_OUTPATIENT_CLINIC_OR_DEPARTMENT_OTHER): Payer: Medicare Other | Admitting: Hematology and Oncology

## 2019-04-18 VITALS — BP 120/79 | HR 84 | Temp 97.0°F | Resp 16 | Wt 148.6 lb

## 2019-04-18 VITALS — BP 119/76 | HR 67 | Temp 97.8°F | Resp 18

## 2019-04-18 DIAGNOSIS — G893 Neoplasm related pain (acute) (chronic): Secondary | ICD-10-CM

## 2019-04-18 DIAGNOSIS — Z5111 Encounter for antineoplastic chemotherapy: Secondary | ICD-10-CM

## 2019-04-18 DIAGNOSIS — Z5112 Encounter for antineoplastic immunotherapy: Secondary | ICD-10-CM

## 2019-04-18 DIAGNOSIS — Z7189 Other specified counseling: Secondary | ICD-10-CM

## 2019-04-18 DIAGNOSIS — C3431 Malignant neoplasm of lower lobe, right bronchus or lung: Secondary | ICD-10-CM | POA: Diagnosis not present

## 2019-04-18 DIAGNOSIS — C801 Malignant (primary) neoplasm, unspecified: Secondary | ICD-10-CM

## 2019-04-18 DIAGNOSIS — C7951 Secondary malignant neoplasm of bone: Secondary | ICD-10-CM

## 2019-04-18 DIAGNOSIS — E860 Dehydration: Secondary | ICD-10-CM | POA: Diagnosis not present

## 2019-04-18 DIAGNOSIS — F419 Anxiety disorder, unspecified: Secondary | ICD-10-CM

## 2019-04-18 DIAGNOSIS — C7801 Secondary malignant neoplasm of right lung: Secondary | ICD-10-CM | POA: Diagnosis not present

## 2019-04-18 LAB — CBC WITH DIFFERENTIAL/PLATELET
Abs Immature Granulocytes: 0.05 10*3/uL (ref 0.00–0.07)
Basophils Absolute: 0 10*3/uL (ref 0.0–0.1)
Basophils Relative: 0 %
Eosinophils Absolute: 0.1 10*3/uL (ref 0.0–0.5)
Eosinophils Relative: 1 %
HCT: 30.4 % — ABNORMAL LOW (ref 39.0–52.0)
Hemoglobin: 9.9 g/dL — ABNORMAL LOW (ref 13.0–17.0)
Immature Granulocytes: 1 %
Lymphocytes Relative: 7 %
Lymphs Abs: 0.6 10*3/uL — ABNORMAL LOW (ref 0.7–4.0)
MCH: 29 pg (ref 26.0–34.0)
MCHC: 32.6 g/dL (ref 30.0–36.0)
MCV: 89.1 fL (ref 80.0–100.0)
Monocytes Absolute: 1.3 10*3/uL — ABNORMAL HIGH (ref 0.1–1.0)
Monocytes Relative: 15 %
Neutro Abs: 6.6 10*3/uL (ref 1.7–7.7)
Neutrophils Relative %: 76 %
Platelets: 448 10*3/uL — ABNORMAL HIGH (ref 150–400)
RBC: 3.41 MIL/uL — ABNORMAL LOW (ref 4.22–5.81)
RDW: 14.9 % (ref 11.5–15.5)
WBC: 8.6 10*3/uL (ref 4.0–10.5)
nRBC: 0 % (ref 0.0–0.2)

## 2019-04-18 LAB — COMPREHENSIVE METABOLIC PANEL
ALT: 11 U/L (ref 0–44)
AST: 19 U/L (ref 15–41)
Albumin: 3.6 g/dL (ref 3.5–5.0)
Alkaline Phosphatase: 66 U/L (ref 38–126)
Anion gap: 9 (ref 5–15)
BUN: 11 mg/dL (ref 8–23)
CO2: 25 mmol/L (ref 22–32)
Calcium: 9.3 mg/dL (ref 8.9–10.3)
Chloride: 104 mmol/L (ref 98–111)
Creatinine, Ser: 0.9 mg/dL (ref 0.61–1.24)
GFR calc Af Amer: >60 mL/min (ref >60)
GFR calc non Af Amer: >60 mL/min (ref >60)
Glucose, Bld: 102 mg/dL — ABNORMAL HIGH (ref 70–99)
Potassium: 3.9 mmol/L (ref 3.5–5.1)
Sodium: 138 mmol/L (ref 135–145)
Total Bilirubin: 0.5 mg/dL (ref 0.3–1.2)
Total Protein: 7 g/dL (ref 6.5–8.1)

## 2019-04-18 LAB — MAGNESIUM: Magnesium: 2 mg/dL (ref 1.7–2.4)

## 2019-04-18 LAB — TSH: TSH: 0.505 u[IU]/mL (ref 0.350–4.500)

## 2019-04-18 MED ORDER — SODIUM CHLORIDE 0.9 % IV SOLN
Freq: Once | INTRAVENOUS | Status: AC
  Administered 2019-04-18: 11:00:00 via INTRAVENOUS
  Filled 2019-04-18: qty 5

## 2019-04-18 MED ORDER — SODIUM CHLORIDE 0.9% FLUSH
10.0000 mL | INTRAVENOUS | Status: DC | PRN
  Administered 2019-04-18: 09:00:00 10 mL via INTRAVENOUS
  Filled 2019-04-18: qty 10

## 2019-04-18 MED ORDER — SODIUM CHLORIDE 0.9 % IV SOLN
200.0000 mg | Freq: Once | INTRAVENOUS | Status: AC
  Administered 2019-04-18: 200 mg via INTRAVENOUS
  Filled 2019-04-18: qty 8

## 2019-04-18 MED ORDER — SODIUM CHLORIDE 0.9 % IV SOLN
500.0000 mg/m2 | Freq: Once | INTRAVENOUS | Status: AC
  Administered 2019-04-18: 900 mg via INTRAVENOUS
  Filled 2019-04-18: qty 20

## 2019-04-18 MED ORDER — HEPARIN SOD (PORK) LOCK FLUSH 100 UNIT/ML IV SOLN
500.0000 [IU] | Freq: Once | INTRAVENOUS | Status: AC
  Administered 2019-04-18: 500 [IU] via INTRAVENOUS
  Filled 2019-04-18: qty 5

## 2019-04-18 MED ORDER — PALONOSETRON HCL INJECTION 0.25 MG/5ML
0.2500 mg | Freq: Once | INTRAVENOUS | Status: AC
  Administered 2019-04-18: 11:00:00 0.25 mg via INTRAVENOUS
  Filled 2019-04-18: qty 5

## 2019-04-18 MED ORDER — SODIUM CHLORIDE 0.9 % IV SOLN
188.8000 mg | Freq: Once | INTRAVENOUS | Status: AC
  Administered 2019-04-18: 190 mg via INTRAVENOUS
  Filled 2019-04-18: qty 19

## 2019-04-18 MED ORDER — SODIUM CHLORIDE 0.9 % IV SOLN
Freq: Once | INTRAVENOUS | Status: AC
  Administered 2019-04-18: 10:00:00 via INTRAVENOUS
  Filled 2019-04-18: qty 250

## 2019-04-18 NOTE — Progress Notes (Signed)
Patient here for follow up. Denies any concerns.  

## 2019-04-19 ENCOUNTER — Other Ambulatory Visit: Payer: Self-pay

## 2019-04-19 ENCOUNTER — Inpatient Hospital Stay: Payer: Medicare Other

## 2019-04-19 LAB — T4: T4, Total: 10 ug/dL (ref 4.5–12.0)

## 2019-04-19 NOTE — Telephone Encounter (Signed)
SW attempted to contact patient to schedule palliative care visit. SW left VM and contact information.

## 2019-04-20 ENCOUNTER — Other Ambulatory Visit: Payer: Self-pay

## 2019-04-20 ENCOUNTER — Telehealth: Payer: Self-pay

## 2019-04-20 ENCOUNTER — Inpatient Hospital Stay: Payer: Medicare Other

## 2019-04-20 NOTE — Telephone Encounter (Signed)
Telephone call to patients home, patient didn ot answer.  RN left message requesting call back to schedule palliative visit.

## 2019-04-21 ENCOUNTER — Inpatient Hospital Stay: Payer: Medicare Other

## 2019-04-22 ENCOUNTER — Telehealth: Payer: Self-pay

## 2019-04-22 ENCOUNTER — Inpatient Hospital Stay: Payer: Medicare Other

## 2019-04-22 NOTE — Telephone Encounter (Signed)
Telephone call to patients home to schedule palliative visit.  Patient did not answer phone, RN left message requesting call back to schedule palliative care visit.

## 2019-04-23 ENCOUNTER — Emergency Department: Payer: Medicare Other

## 2019-04-23 ENCOUNTER — Other Ambulatory Visit: Payer: Self-pay

## 2019-04-23 ENCOUNTER — Emergency Department
Admission: EM | Admit: 2019-04-23 | Discharge: 2019-04-23 | Disposition: A | Discharge status: Home / Self Care | Payer: Medicare Other | Attending: Emergency Medicine | Admitting: Emergency Medicine

## 2019-04-23 ENCOUNTER — Encounter: Payer: Self-pay | Admitting: Radiology

## 2019-04-23 ENCOUNTER — Telehealth: Payer: Self-pay | Admitting: Oncology

## 2019-04-23 DIAGNOSIS — C3490 Malignant neoplasm of unspecified part of unspecified bronchus or lung: Secondary | ICD-10-CM | POA: Diagnosis not present

## 2019-04-23 DIAGNOSIS — R1110 Vomiting, unspecified: Secondary | ICD-10-CM | POA: Diagnosis not present

## 2019-04-23 DIAGNOSIS — Z7982 Long term (current) use of aspirin: Secondary | ICD-10-CM | POA: Diagnosis not present

## 2019-04-23 DIAGNOSIS — R1010 Upper abdominal pain, unspecified: Secondary | ICD-10-CM | POA: Diagnosis not present

## 2019-04-23 DIAGNOSIS — Z87891 Personal history of nicotine dependence: Secondary | ICD-10-CM | POA: Insufficient documentation

## 2019-04-23 DIAGNOSIS — T451X5A Adverse effect of antineoplastic and immunosuppressive drugs, initial encounter: Secondary | ICD-10-CM

## 2019-04-23 DIAGNOSIS — Z79899 Other long term (current) drug therapy: Secondary | ICD-10-CM | POA: Insufficient documentation

## 2019-04-23 DIAGNOSIS — R197 Diarrhea, unspecified: Secondary | ICD-10-CM | POA: Diagnosis not present

## 2019-04-23 DIAGNOSIS — R110 Nausea: Secondary | ICD-10-CM | POA: Insufficient documentation

## 2019-04-23 LAB — URINALYSIS, COMPLETE (UACMP) WITH MICROSCOPIC
Bacteria, UA: NONE SEEN
Bilirubin Urine: NEGATIVE
Glucose, UA: NEGATIVE mg/dL
Hgb urine dipstick: NEGATIVE
Ketones, ur: 5 mg/dL — AB
Nitrite: NEGATIVE
Protein, ur: 30 mg/dL — AB
Specific Gravity, Urine: 1.017 (ref 1.005–1.030)
pH: 6 (ref 5.0–8.0)

## 2019-04-23 LAB — COMPREHENSIVE METABOLIC PANEL
ALT: 14 U/L (ref 0–44)
AST: 19 U/L (ref 15–41)
Albumin: 3.6 g/dL (ref 3.5–5.0)
Alkaline Phosphatase: 63 U/L (ref 38–126)
Anion gap: 11 (ref 5–15)
BUN: 18 mg/dL (ref 8–23)
CO2: 22 mmol/L (ref 22–32)
Calcium: 8.8 mg/dL — ABNORMAL LOW (ref 8.9–10.3)
Chloride: 103 mmol/L (ref 98–111)
Creatinine, Ser: 1.12 mg/dL (ref 0.61–1.24)
GFR calc Af Amer: >60 mL/min (ref >60)
GFR calc non Af Amer: >60 mL/min (ref >60)
Glucose, Bld: 129 mg/dL — ABNORMAL HIGH (ref 70–99)
Potassium: 3.9 mmol/L (ref 3.5–5.1)
Sodium: 136 mmol/L (ref 135–145)
Total Bilirubin: 1.3 mg/dL — ABNORMAL HIGH (ref 0.3–1.2)
Total Protein: 7 g/dL (ref 6.5–8.1)

## 2019-04-23 LAB — CBC
HCT: 39.2 % (ref 39.0–52.0)
Hemoglobin: 12.9 g/dL — ABNORMAL LOW (ref 13.0–17.0)
MCH: 29 pg (ref 26.0–34.0)
MCHC: 32.9 g/dL (ref 30.0–36.0)
MCV: 88.1 fL (ref 80.0–100.0)
Platelets: 500 10*3/uL — ABNORMAL HIGH (ref 150–400)
RBC: 4.45 MIL/uL (ref 4.22–5.81)
RDW: 14.3 % (ref 11.5–15.5)
WBC: 10.6 10*3/uL — ABNORMAL HIGH (ref 4.0–10.5)
nRBC: 0 % (ref 0.0–0.2)

## 2019-04-23 LAB — LACTIC ACID, PLASMA
Lactic Acid, Venous: 1.8 mmol/L (ref 0.5–1.9)
Lactic Acid, Venous: 1.2 mmol/L (ref 0.5–1.9)

## 2019-04-23 LAB — LIPASE, BLOOD: Lipase: 59 U/L — ABNORMAL HIGH (ref 11–51)

## 2019-04-23 MED ORDER — IOHEXOL 300 MG/ML  SOLN
100.0000 mL | Freq: Once | INTRAMUSCULAR | Status: AC | PRN
  Administered 2019-04-23: 100 mL via INTRAVENOUS

## 2019-04-23 MED ORDER — ONDANSETRON HCL 4 MG/2ML IJ SOLN
4.0000 mg | INTRAMUSCULAR | Status: AC
  Administered 2019-04-23: 06:00:00 4 mg via INTRAVENOUS
  Filled 2019-04-23: qty 2

## 2019-04-23 MED ORDER — SODIUM CHLORIDE 0.9 % IV BOLUS
1000.0000 mL | Freq: Once | INTRAVENOUS | Status: AC
  Administered 2019-04-23: 1000 mL via INTRAVENOUS

## 2019-04-23 MED ORDER — MORPHINE SULFATE (PF) 4 MG/ML IV SOLN
4.0000 mg | Freq: Once | INTRAVENOUS | Status: AC
  Administered 2019-04-23: 06:00:00 4 mg via INTRAVENOUS
  Filled 2019-04-23: qty 1

## 2019-04-23 NOTE — ED Triage Notes (Signed)
Patient reports generalized weakness since Wednesday.  Reports abdominal pain that started today.

## 2019-04-23 NOTE — ED Provider Notes (Signed)
[facility] Emergency Department Provider Note  ____________________________________________   First MD Initiated Contact with Patient 04/23/19 0602     (approximate)  I have reviewed the triage vital signs and the nursing notes.   HISTORY  Chief Complaint Abdominal Pain     HPI Jimmy West is a 68 y.o. male with extensive chronic medical issues which notably includes stage IV lung cancer for which he is undergoing chemotherapy under the direction of Dr. Mike Gip.   He presents tonight for evaluation of generalized weakness, decreased appetite, upper abdominal pain, and nausea.  He reports that he got his fourth chemotherapy treatment about 5 days ago (on Monday).  His symptoms started on Wednesday and the abdominal pain got worse yesterday.  This is the same thing that happened to him on each of the prior treatments except for 1 of them that was relatively asymptomatic.  He says that he feels terrible all over and the abdominal pain is aching and in the upper part of his abdomen.  He has had chills but no fever.  He denies any respiratory symptoms including shortness of breath in spite of his history of chronic lung cancer.  He has not been around COVID-19 patients and has been trying to isolate himself.  Nothing in particular makes his symptoms better or worse and he reports that they are severe.  He has not had any dysuria and denies rectal bleeding.        Past Medical History:  Diagnosis Date  . Bulging lumbar disc   . Cancer ([place])    stage 4 lung cancer  . Heart attack [facility])     Patient Active Problem List   Diagnosis Date Noted  . Anxiety in cancer patient 03/29/2019  . Renal insufficiency 03/29/2019  . Other fatigue 03/04/2019  . Vomiting 03/04/2019  . Chemotherapy induced nausea and vomiting 02/12/2019  . Abdominal pain 02/11/2019  . Mild protein-calorie malnutrition ([place]) 10/10/2018  . Weight loss 09/20/2018  . Elevated bilirubin  07/14/2018  . Encounter for antineoplastic chemotherapy 05/24/2018  . Encounter for antineoplastic immunotherapy 05/24/2018  . Goals of care, counseling/discussion 05/17/2018  . Cancer-related pain 05/01/2018  . 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 Primary cancer of right lower lobe of lung ([place]) 04/26/2018  . Chronic midline low back pain without sciatica 04/26/2018    Past Surgical History:  Procedure Laterality Date  . CARDIAC CATHETERIZATION     two stents  . KNEE SURGERY Left   . PORTA CATH INSERTION N/A 05/21/2018   Procedure: PORTA CATH INSERTION;  Surgeon: Algernon Huxley, MD;  Location: [facility];  Service: Cardiovascular;  Laterality: N/A;    Prior to Admission medications   Medication Sig Start Date End Date Taking? Authorizing Provider  acetaminophen (TYLENOL) 500 MG tablet Take 500 mg by mouth 3 (three) times daily as needed.    [provider]  ALPRAZolam Duanne Moron) 0.25 MG tablet Take 1 tablet (0.25 mg total) by mouth 3 (three) times daily as needed for anxiety. 04/11/19   Borders, Kirt Boys, NP  aspirin EC 81 MG tablet Take 81 mg by mouth daily.    [provider]  Calcium 600-400 MG-UNIT CHEW Chew 2 tablets by mouth daily.    [provider]  dexamethasone (DECADRON) 4 MG tablet Take 1 tab two times a day the day before Alimta chemo, then take 2 tabs once a day for 3 days starting the day after chemo. 03/23/19   Mike Gip,  Melissa C, MD  fentaNYL (DURAGESIC) 75 MCG/HR Place 1 patch onto the skin every 3 (three) days. 03/01/19   Borders, Kirt Boys, NP  FLUoxetine (PROZAC) 40 MG capsule Take 1 capsule (40 mg total) by mouth daily. 03/01/19   Borders, Kirt Boys, NP  folic acid (FOLVITE) 1 MG tablet Take 1 tablet (1 mg total) by mouth daily. Start 5-7 days before Alimta chemotherapy. Continue until 21 days after Alimta completed. 01/31/19   Lequita Asal, MD  ibuprofen (ADVIL,MOTRIN) 200 MG tablet Take 200 mg by mouth every 6 (six) hours as needed.     [provider]  ondansetron (ZOFRAN ODT) 4 MG disintegrating tablet Take 1 tablet (4 mg total) by mouth every 6 (six) hours as needed for nausea or vomiting. Patient not taking: Reported on 03/29/2019 02/14/19   Lequita Asal, MD  Oxycodone HCl 20 MG TABS Take 0.5-1 tablets (10-20 mg total) by mouth every 4 (four) hours as needed (for severe pain). 03/30/19   Borders, Kirt Boys, NP  pantoprazole (PROTONIX) 40 MG tablet Take 1 tablet (40 mg total) by mouth daily. 03/05/19   Gladstone Lighter, MD  polyethylene glycol (MIRALAX / GLYCOLAX) packet Take 17 g by mouth daily.    [provider]  senna (SENOKOT) 8.6 MG tablet Take 1 tablet by mouth as needed.     [provider]    Allergies Patient has no known allergies.  Family History  Problem Relation Age of Onset  . Heart failure Father   . Cancer Maternal Aunt   . Heart failure Maternal Uncle   . Dementia Paternal Grandmother   . Cancer Maternal Aunt     Social History Social History   Tobacco Use  . Smoking status: Former Smoker    Packs/day: 1.50    Years: 15.00    Pack years: 22.50    Types: Cigarettes    Quit date: 11/03/2003    Years since quitting: 15.4  . Smokeless tobacco: Never Used  Substance Use Topics  . Alcohol use: Yes    Alcohol/week: 15.0 standard drinks    Types: 15 Shots of liquor per week  . Drug use: No    Review of Systems Constitutional: General malaise and fatigue.  Chills, no fever. Eyes: No visual changes. ENT: No sore throat. Cardiovascular: Denies chest pain. Respiratory: Denies shortness of breath. Gastrointestinal: Upper abdominal pain with nausea and decreased appetite. Genitourinary: Negative for dysuria. Musculoskeletal: Negative for neck pain.  Negative for back pain. Integumentary: Negative for rash. Neurological: Negative for headaches, focal weakness or numbness.   ____________________________________________   PHYSICAL EXAM:  VITAL SIGNS: ED  Triage Vitals  Enc Vitals Group     BP 04/23/19 0056 105/82     Pulse Rate 04/23/19 0056 74     Resp 04/23/19 0056 19     Temp 04/23/19 0056 97.7 F (36.5 C)     Temp Source 04/23/19 0056 Oral     SpO2 04/23/19 0056 97 %     Weight 04/23/19 0058 67.1 kg (148 lb)     Height 04/23/19 0058 1.676 m (5\' 6" )     Head Circumference --      Peak Flow --      Pain Score --      Pain Loc --      Pain Edu? --      Excl. in [place]? --     Constitutional: Alert and oriented.  Appears chronically ill and uncomfortable at this time. Eyes:  Conjunctivae are normal.  Head: Atraumatic. Nose: No congestion/rhinnorhea. Mouth/Throat: Mucous membranes are dry. Neck: No stridor.  No meningeal signs.   Cardiovascular: Normal rate, regular rhythm. Good peripheral circulation. Grossly normal heart sounds. Respiratory: Normal respiratory effort.  No retractions. Gastrointestinal: Soft and nondistended.  Tenderness to palpation of the upper abdomen including the epigastrium.  Some tenderness of the right upper quadrant but with equivocal Murphy sign.  No right lower quadrant tenderness to palpation. Musculoskeletal: No lower extremity tenderness nor edema. No gross deformities of extremities. Neurologic:  Normal speech and language. No gross focal neurologic deficits are appreciated.  Skin:  Skin is warm, dry and intact. Psychiatric: Mood and affect are normal. Speech and behavior are normal.  ____________________________________________   LABS (all labs ordered are listed, but only abnormal results are displayed)  Labs Reviewed  LIPASE, BLOOD - Abnormal; Notable for the following components:      Result Value   Lipase 59 (*)    All other components within normal limits  COMPREHENSIVE METABOLIC PANEL - Abnormal; Notable for the following components:   Glucose, Bld 129 (*)    Calcium 8.8 (*)    Total Bilirubin 1.3 (*)    All other components within normal limits  CBC - Abnormal; Notable for the  following components:   WBC 10.6 (*)    Hemoglobin 12.9 (*)    Platelets 500 (*)    All other components within normal limits  URINALYSIS, COMPLETE (UACMP) WITH MICROSCOPIC - Abnormal; Notable for the following components:   Color, Urine YELLOW (*)    APPearance CLEAR (*)    Ketones, ur 5 (*)    Protein, ur 30 (*)    Leukocytes,Ua TRACE (*)    All other components within normal limits  LACTIC ACID, PLASMA  LACTIC ACID, PLASMA   ____________________________________________  EKG  No indication for emergent EKG ____________________________________________  RADIOLOGY I, Hinda Kehr, personally viewed and evaluated these images (plain radiographs) as part of my medical decision making, as well as reviewing the written report by the radiologist.  ED MD interpretation:  CT pending at time of signout  Official radiology report(s): No results found.  ____________________________________________   PROCEDURES   Procedure(s) performed (including Critical Care):  Procedures   ____________________________________________   INITIAL IMPRESSION / MDM / [place] / ED COURSE  As part of my medical decision making, I reviewed the following data within the [place] notes reviewed and incorporated, Labs reviewed , Old chart reviewed and Notes from prior ED visits, and transferred ED care to Dr. Jari Pigg.   Differential diagnosis includes, but is not limited to, medication/treatment side effect, acute intra-abdominal infection such as colitis or diverticulitis, SBO/ileus, pancreatitis, biliary disease.  After reading through the medical record it appears that he has had similar symptoms in the past.  He also has an ottoman is medical history that notes a chronic bilirubin elevation.  His labs tonight are notable for an essentially normal comprehensive metabolic panel except for a very slightly elevated total bilirubin of 1.3.  His urinalysis is notable  for some leukocytes and mild ketones but no visible bacteria.  I will send it to culture.  Lipase is mildly elevated at 59 which could be related to volume depletion although he is not been having any vomiting or it could represent a developing and early pancreatitis.  Very mild leukocytosis of 10.6.  I will provide morphine 4 mg IV and Zofran 4 mg IV as well as  a liter of fluid because I believe that he is volume depleted.  Anticipate scanning his abdomen for signs of acute infection; he had a colitis identified on scan previously but that was about 3 months ago.  Disposition will be determined by his clinical status as well as the imaging findings.      Clinical Course as of Apr 22 706  Sat Apr 23, 2019  0706 Transferring ED care to Dr. Jari Pigg to follow up CT and disposition appropriately.   [CF]    Clinical Course User Index [CF] Hinda Kehr, MD     ____________________________________________  FINAL CLINICAL IMPRESSION(S) / ED DIAGNOSES  Final diagnoses:  Pain of upper abdomen  Nausea  Immunosuppressed due to chemotherapy     MEDICATIONS GIVEN DURING THIS VISIT:  Medications  sodium chloride 0.9 % bolus 1,000 mL (1,000 mLs Intravenous New Bag/Given 04/23/19 0626)  morphine 4 MG/ML injection 4 mg (4 mg Intravenous Given 04/23/19 0627)  ondansetron (ZOFRAN) injection 4 mg (4 mg Intravenous Given 04/23/19 1610)     ED Discharge Orders    None      *Please note:  Everson Mott was evaluated in Emergency Department on 04/23/2019 for the symptoms described in the history of present illness. He was evaluated in the context of the global COVID-19 pandemic, which necessitated consideration that the patient might be at risk for infection with the SARS-CoV-2 virus that causes COVID-19. Institutional protocols and algorithms that pertain to the evaluation of patients at risk for COVID-19 are in a state of rapid change based on information released by regulatory bodies including  the CDC and federal and state organizations. These policies and algorithms were followed during the patient's care in the ED.  Some ED evaluations and interventions may be delayed as a result of limited staffing during the pandemic.*  Note:  This document was prepared using Dragon voice recognition software and may include unintentional dictation errors.   Hinda Kehr, MD 04/23/19 [phone]

## 2019-04-23 NOTE — Telephone Encounter (Signed)
Christane called and reported that patient was doing miserable.  Patient went to ER this AM for weakness, abdominal pain. CT abd no acute process.  He was given supportive care and felt better.  I discussed with ER physician and offered patient to stay overnight for symptom management. Per ER note, patient felt better and preferred to go home.  I advise Christane that if patient is "feeling miserable", he should come back to ER for further evaluation. Christane will call patient and find out.

## 2019-04-23 NOTE — ED Provider Notes (Addendum)
7:08 AM Assumed care for off going team.   Blood pressure 108/78, pulse 86, temperature 97.7 F (36.5 C), temperature source Oral, resp. rate 19, height 5\' 6"  (1.676 m), weight 67.1 kg, SpO2 98 %.  See their HPI for full report but in brief Stage 4 lung cancer on chemotherapy by [facility]coran. Feels similar after rx. Abd pain, weakness, nausea.  Given NS, morphine, zofran. Plan for CT scan.  Possible admission for symptoms.  Slightly elevated white count and platelets but patient says that his symptoms feel very similar to when he is had chemotherapy in the past.  He has no fever at this time.  CT scan was negative.  8:06 AM reevaluated patient and says that he just feels too weak to be able to leave at this time.  Discussed with Dr. Tasia Catchings from oncology who agrees with admission for symptomatic control if patient feels uncomfortable about d/c.  9:26 AM reevaluated patient.  I discussed with patient that he could be admitted to hospital.  Patient tolerating p.o.  Patient says he like to wait another hour to see if he feels that he could go home at that time rather than have to be admitted.  10:04 AM reevaluated patient.  Patient is now claiming that he would prefer to go home.  Patient has been tolerating p.o.  He declines any any other additional medications at home.  I again discussed with patient that it is easy for me to admit him if he wants to stay overnight for symptom management but he says that he is feeling better and would prefer to go home at this time.  I discussed the provisional nature of ED diagnosis, the treatment so far, the ongoing plan of care, follow up appointments and return precautions with the patient and any family or support people present. They expressed understanding and agreed with the plan, discharged home.                  Vanessa Hubbard, MD 04/23/19 1006    Vanessa [place], MD 04/23/19 [phone]

## 2019-04-23 NOTE — ED Notes (Signed)
Dr. Funke at bedside. 

## 2019-04-23 NOTE — ED Notes (Signed)
Patient transported to CT 

## 2019-04-23 NOTE — Discharge Instructions (Addendum)
Your work-up was reassuring including negative CT scan for acute processes.  He can follow with your oncologist on Monday.  If you have worsening symptoms prior to that you should return to the ER or if you have any other concerns.   IMPRESSION: 1. No acute abdominal or pelvic pathology. 2. Fluid in the colon can be seen with diarrhea. 3. Diverticulosis without evidence of diverticulitis. 4. Stable 15 mm left adrenal nodule.

## 2019-04-23 NOTE — ED Notes (Signed)
Pt given sandwich tray and water

## 2019-04-25 ENCOUNTER — Inpatient Hospital Stay: Payer: Medicare Other | Attending: Hematology and Oncology

## 2019-04-25 ENCOUNTER — Telehealth: Payer: Self-pay

## 2019-04-25 ENCOUNTER — Inpatient Hospital Stay: Payer: Medicare Other

## 2019-04-25 ENCOUNTER — Inpatient Hospital Stay (HOSPITAL_BASED_OUTPATIENT_CLINIC_OR_DEPARTMENT_OTHER): Payer: Medicare Other | Admitting: Hematology and Oncology

## 2019-04-25 ENCOUNTER — Other Ambulatory Visit: Payer: Self-pay

## 2019-04-25 ENCOUNTER — Other Ambulatory Visit: Payer: Self-pay | Admitting: *Deleted

## 2019-04-25 VITALS — BP 96/60 | HR 80 | Temp 97.1°F | Resp 18

## 2019-04-25 VITALS — BP 96/65 | HR 80 | Temp 98.1°F | Resp 20 | Wt 140.4 lb

## 2019-04-25 DIAGNOSIS — C7801 Secondary malignant neoplasm of right lung: Secondary | ICD-10-CM | POA: Insufficient documentation

## 2019-04-25 DIAGNOSIS — C3431 Malignant neoplasm of lower lobe, right bronchus or lung: Secondary | ICD-10-CM

## 2019-04-25 DIAGNOSIS — R112 Nausea with vomiting, unspecified: Secondary | ICD-10-CM | POA: Diagnosis not present

## 2019-04-25 DIAGNOSIS — Z79899 Other long term (current) drug therapy: Secondary | ICD-10-CM | POA: Diagnosis not present

## 2019-04-25 DIAGNOSIS — G893 Neoplasm related pain (acute) (chronic): Secondary | ICD-10-CM | POA: Diagnosis not present

## 2019-04-25 DIAGNOSIS — T451X5A Adverse effect of antineoplastic and immunosuppressive drugs, initial encounter: Secondary | ICD-10-CM

## 2019-04-25 DIAGNOSIS — F419 Anxiety disorder, unspecified: Secondary | ICD-10-CM | POA: Insufficient documentation

## 2019-04-25 DIAGNOSIS — Z923 Personal history of irradiation: Secondary | ICD-10-CM | POA: Insufficient documentation

## 2019-04-25 DIAGNOSIS — C7951 Secondary malignant neoplasm of bone: Secondary | ICD-10-CM

## 2019-04-25 DIAGNOSIS — R109 Unspecified abdominal pain: Secondary | ICD-10-CM | POA: Diagnosis not present

## 2019-04-25 DIAGNOSIS — Z23 Encounter for immunization: Secondary | ICD-10-CM | POA: Diagnosis not present

## 2019-04-25 DIAGNOSIS — Z7189 Other specified counseling: Secondary | ICD-10-CM | POA: Diagnosis not present

## 2019-04-25 DIAGNOSIS — R634 Abnormal weight loss: Secondary | ICD-10-CM

## 2019-04-25 DIAGNOSIS — R5383 Other fatigue: Secondary | ICD-10-CM | POA: Diagnosis not present

## 2019-04-25 LAB — CBC WITH DIFFERENTIAL/PLATELET
Abs Immature Granulocytes: 0.01 10*3/uL (ref 0.00–0.07)
Basophils Absolute: 0 10*3/uL (ref 0.0–0.1)
Basophils Relative: 1 %
Eosinophils Absolute: 0.5 10*3/uL (ref 0.0–0.5)
Eosinophils Relative: 14 %
HCT: 31.6 % — ABNORMAL LOW (ref 39.0–52.0)
Hemoglobin: 10.7 g/dL — ABNORMAL LOW (ref 13.0–17.0)
Immature Granulocytes: 0 %
Lymphocytes Relative: 25 %
Lymphs Abs: 0.9 10*3/uL (ref 0.7–4.0)
MCH: 29.3 pg (ref 26.0–34.0)
MCHC: 33.9 g/dL (ref 30.0–36.0)
MCV: 86.6 fL (ref 80.0–100.0)
Monocytes Absolute: 0.1 10*3/uL (ref 0.1–1.0)
Monocytes Relative: 3 %
Neutro Abs: 2.1 10*3/uL (ref 1.7–7.7)
Neutrophils Relative %: 57 %
Platelets: 221 10*3/uL (ref 150–400)
RBC: 3.65 MIL/uL — ABNORMAL LOW (ref 4.22–5.81)
RDW: 14.2 % (ref 11.5–15.5)
WBC: 3.6 10*3/uL — ABNORMAL LOW (ref 4.0–10.5)
nRBC: 0 % (ref 0.0–0.2)

## 2019-04-25 LAB — COMPREHENSIVE METABOLIC PANEL
ALT: 17 U/L (ref 0–44)
AST: 21 U/L (ref 15–41)
Albumin: 3.7 g/dL (ref 3.5–5.0)
Alkaline Phosphatase: 62 U/L (ref 38–126)
Anion gap: 9 (ref 5–15)
BUN: 16 mg/dL (ref 8–23)
CO2: 23 mmol/L (ref 22–32)
Calcium: 8.6 mg/dL — ABNORMAL LOW (ref 8.9–10.3)
Chloride: 106 mmol/L (ref 98–111)
Creatinine, Ser: 0.88 mg/dL (ref 0.61–1.24)
GFR calc Af Amer: >60 mL/min (ref >60)
GFR calc non Af Amer: >60 mL/min (ref >60)
Glucose, Bld: 132 mg/dL — ABNORMAL HIGH (ref 70–99)
Potassium: 3.6 mmol/L (ref 3.5–5.1)
Sodium: 138 mmol/L (ref 135–145)
Total Bilirubin: 0.7 mg/dL (ref 0.3–1.2)
Total Protein: 6.7 g/dL (ref 6.5–8.1)

## 2019-04-25 LAB — MAGNESIUM: Magnesium: 2.1 mg/dL (ref 1.7–2.4)

## 2019-04-25 MED ORDER — HEPARIN SOD (PORK) LOCK FLUSH 100 UNIT/ML IV SOLN
500.0000 [IU] | Freq: Once | INTRAVENOUS | Status: AC
  Administered 2019-04-25: 500 [IU] via INTRAVENOUS

## 2019-04-25 MED ORDER — OXYCODONE HCL 20 MG PO TABS: 0.5000 | ORAL_TABLET | ORAL | 0 refills | Status: DC | PRN

## 2019-04-25 MED ORDER — SODIUM CHLORIDE 0.9 % IV SOLN
Freq: Once | INTRAVENOUS | Status: AC
  Administered 2019-04-25: 14:00:00 via INTRAVENOUS
  Filled 2019-04-25: qty 250

## 2019-04-25 MED ORDER — ALPRAZOLAM 0.25 MG PO TABS: 0.2500 mg | ORAL_TABLET | Freq: Three times a day (TID) | ORAL | 0 refills | Status: DC | PRN

## 2019-04-25 MED ORDER — ONDANSETRON HCL 4 MG/2ML IJ SOLN
8.0000 mg | Freq: Once | INTRAMUSCULAR | Status: AC
  Administered 2019-04-25: 8 mg via INTRAVENOUS
  Filled 2019-04-25: qty 4

## 2019-04-25 MED ORDER — SODIUM CHLORIDE 0.9% FLUSH
10.0000 mL | INTRAVENOUS | Status: DC | PRN
  Administered 2019-04-25: 10 mL via INTRAVENOUS
  Filled 2019-04-25: qty 10

## 2019-04-25 MED ORDER — SODIUM CHLORIDE 0.9 % IV SOLN: Freq: Once | INTRAVENOUS | Status: DC

## 2019-04-25 NOTE — Progress Notes (Signed)
Patient here for ED follow up. Reports he is not feeling well. Has not slept the past 2 nights due to restless legs. Reports vomiting x 1 episode yesterday. States abdominal pain, which brought him to the ED, is no longer present. No appetite.

## 2019-04-25 NOTE — Telephone Encounter (Signed)
Contacted patient to check on how he is feeling post ED visit. Patient states that he is still not eating much and continues to feel sick. Reprots this started Wednesday afternoon. Patient also reports not being able to sleep the past 2 nights due to questionable restless leg syndrome. Reports he cannot sit still so is unable to sleep any. Patient is requesting refill on Xanax and Oxycodone, refill request sent to Billey Chang, NP. Informed Dr. Mike Gip and she requested patient come in to clinic to be seen. Patient is agreeable.

## 2019-04-25 NOTE — Progress Notes (Signed)
[facility]  [address] [place], [place] [address] Phone: [phone]  Fax: [phone]   Clinic Day:  04/25/2019  Referring physician: [name], NP  Chief Complaint: Jimmy West is a 68 y.o. male with metastatic adenocarcinoma of the lung who is seen for sick call assessment on day 8 of cycle #4 carboplatin, Alimta, and pembrolizumab.  HPI: The patient was last seen in the medical oncology clinic on 04/18/2019.  At that time, he was doing well.  Exam was stable.  Decision was made to slowly add back carboplatin to his chemotherapy regimen.  He received carboplatin (AUC 2), Alimta, and pembrolizumab.  He was scheduled for daily assessment and possible IVF during the week.  He did not receive fluids as he felt he did not need them.  He presented to the [facility] ER on 04/23/2019 for abdominal pain.  He presented with generalized weakness, decreased appetite, nausea and upper abdominal pain.  Symptoms started on 04/20/2019.  Symptoms were similar to his prior episode of chemotherapy with carboplatin.  He described chills but no fever.  Hematocrit was 39.2, hemoglobin 12.9, platelets 500,000, white count 10,600.  Creatinine was 1.12.  Lipase was 59.  Bilirubin was 1.3.  Lactic acid was 1.2-1.8.  Urinalysis was negative.    Abdomen and pelvis CT scan on 04/23/2019 revealed no acute abdominal or pelvic pathology.  There was fluid in the colon which can be seen with diarrhea.  There was diverticulosis without evidence of diverticulitis.  There was a stable 1.5 cm left adrenal nodule.   He received IV fluids as well as ondansetron 4 mg IV and morphine 4 mg IV.  He declined admission.  During the interim, he hasn't been eating well. He had emesis once yesterday while brushing his teeth.  He hasn't slept in the past 2 days.  He has restless legs. He no longer has abdominal pain. He reached out to friend [name], but didn't tell her how bad he was  feeling.  He felt fine when he left last week on 04/18/2019.  Around Tuesday, 04/19/2019, he felt a little sick but "nothing too severe". He was in bed all day Wednesday with nausea. Symptoms got worse on Thursday.  He had to go to the ER Friday night; [name] took him.   He left 10 or 11AM Saturday morning. He has been in bed most of the day Sunday; he can't sleep.  He took one of his Xanax but didn't help. Nausea wasn't as severe until yesterday. He says he had emesis 10-15 times. He is voiding frequently.   Past Medical History:  Diagnosis Date  . Bulging lumbar disc   . Cancer ([place])    stage 4 lung cancer  . Heart attack [facility])     Past Surgical History:  Procedure Laterality Date  . CARDIAC CATHETERIZATION     two stents  . KNEE SURGERY Left   . PORTA CATH INSERTION N/A 05/21/2018   Procedure: PORTA CATH INSERTION;  Surgeon: Algernon Huxley, MD;  Location: [facility];  Service: Cardiovascular;  Laterality: N/A;    Family History  Problem Relation Age of Onset  . Heart failure Father   . Cancer Maternal Aunt   . Heart failure Maternal Uncle   . Dementia Paternal Grandmother   . Cancer Maternal Aunt     Social History:  reports that he quit smoking about 15 years ago. His smoking use included cigarettes. He has a 22.50  pack-year smoking history. He has never used smokeless tobacco. He reports current alcohol use of about 15.0 standard drinks of alcohol per week. He reports that he does not use drugs. He started smoking at age 24. He is smoking 1 1/2 packs/day. Patient denies known exposures to radiation ortoxins. 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  The patient is alone today.  Allergies: No Known Allergies  Current Medications: Current Outpatient Medications  Medication Sig Dispense Refill  . acetaminophen (TYLENOL) 500 MG tablet Take 500 mg by mouth 3 (three) times daily as needed.    .  ALPRAZolam (XANAX) 0.25 MG tablet Take 1 tablet (0.25 mg total) by mouth 3 (three) times daily as needed for anxiety. 45 tablet 0  . aspirin EC 81 MG tablet Take 81 mg by mouth daily.    . Calcium 600-400 MG-UNIT CHEW Chew 2 tablets by mouth daily.    . fentaNYL (DURAGESIC) 75 MCG/HR Place 1 patch onto the skin every 3 (three) days. 10 patch 0  . FLUoxetine (PROZAC) 40 MG capsule Take 1 capsule (40 mg total) by mouth daily. 30 capsule 3  . folic acid (FOLVITE) 1 MG tablet Take 1 tablet (1 mg total) by mouth daily. Start 5-7 days before Alimta chemotherapy. Continue until 21 days after Alimta completed. 100 tablet 3  . ibuprofen (ADVIL,MOTRIN) 200 MG tablet Take 200 mg by mouth every 6 (six) hours as needed.    . ondansetron (ZOFRAN ODT) 4 MG disintegrating tablet Take 1 tablet (4 mg total) by mouth every 6 (six) hours as needed for nausea or vomiting. 30 tablet 1  . Oxycodone HCl 20 MG TABS Take 0.5-1 tablets (10-20 mg total) by mouth every 4 (four) hours as needed (for severe pain). 90 tablet 0  . pantoprazole (PROTONIX) 40 MG tablet Take 1 tablet (40 mg total) by mouth daily. 30 tablet 1  . polyethylene glycol (MIRALAX / GLYCOLAX) packet Take 17 g by mouth daily.    Marland Kitchen senna (SENOKOT) 8.6 MG tablet Take 1 tablet by mouth as needed.     Marland Kitchen dexamethasone (DECADRON) 4 MG tablet Take 1 tab two times a day the day before Alimta chemo, then take 2 tabs once a day for 3 days starting the day after chemo. (Patient not taking: Reported on 04/25/2019) 30 tablet 1   No current facility-administered medications for this visit.    Facility-Administered Medications Ordered in Other Visits  Medication Dose Route Frequency Provider Last Rate Last Dose  . 0.9 %  sodium chloride infusion   Intravenous Once ,  C, MD      . 0.9 %  sodium chloride infusion   Intravenous Once PRN ,  C, MD      . albuterol (PROVENTIL) (2.5 MG/3ML) 0.083% nebulizer solution 2.5 mg  2.5 mg Nebulization Once  PRN ,  C, MD      . alteplase (CATHFLO ACTIVASE) injection 2 mg  2 mg Intracatheter Once PRN Nolon Stalls C, MD      . EPINEPHrine (ADRENALIN) 1 MG/10ML injection 0.25 mg  0.25 mg Intravenous Once PRN ,  C, MD      . EPINEPHrine (ADRENALIN) 1 MG/10ML injection 0.25 mg  0.25 mg Intravenous Once PRN ,  C, MD      . heparin lock flush 100 unit/mL  500 Units Intracatheter Once PRN Nolon Stalls C, MD      . heparin lock flush 100 unit/mL  250 Units Intracatheter Once PRN Nolon Stalls C, MD      . heparin lock flush 100 unit/mL  500 Units Intravenous Once ,  C, MD      . sodium chloride flush (NS) 0.9 % injection 10 mL  10 mL Intravenous PRN Lequita Asal, MD   10 mL at 03/04/19 0959  . sodium chloride flush (NS) 0.9 % injection 10 mL  10 mL Intracatheter Once PRN Nolon Stalls C, MD      . sodium chloride flush (NS) 0.9 % injection 10 mL  10 mL Intravenous PRN ,  C, MD      . sodium chloride flush (NS) 0.9 % injection 3 mL  3 mL Intracatheter Once PRN Lequita Asal, MD        Review of Systems  Constitutional: Positive for malaise/fatigue (hadn't slept in 2 days) and weight loss (8 lbs). Negative for chills, diaphoresis and fever.       Exhausted.  HENT: Negative for congestion, ear pain, nosebleeds, sinus pain and sore throat.        Rhinitis.  Eyes: Negative.  Negative for blurred vision, double vision, discharge and redness.  Respiratory: Negative.  Negative for cough, hemoptysis, sputum production and shortness of breath.   Cardiovascular: Negative.  Negative for chest pain, palpitations, orthopnea, leg swelling and PND.  Gastrointestinal: Positive for nausea and vomiting (10-15 times on 04/24/2019). Negative for abdominal pain, blood in stool, constipation, diarrhea and melena.       Poor appetite.  Genitourinary: Negative.  Negative for dysuria, frequency, hematuria and urgency.   Musculoskeletal: Positive for back pain (feels "ok"). Negative for joint pain, myalgias and neck pain.  Skin: Negative.  Negative for rash.  Neurological: Negative for dizziness, tingling, tremors, sensory change, speech change, focal weakness, weakness and headaches.       Restless legs.  Endo/Heme/Allergies: Negative.  Negative for environmental allergies. Does not bruise/bleed easily.  Psychiatric/Behavioral: Negative for depression and memory loss. The patient is nervous/anxious (on Xanax). The patient does not have insomnia.    Performance status (ECOG): 2  Vitals Blood pressure 96/65, pulse 80, temperature 98.1 F (36.7 C), temperature source Oral, resp. rate 20, weight 140 lb 6.9 oz (63.7 kg), SpO2 100 %.   Physical Exam  Constitutional: He is oriented to person, place, and time.  Acutely ill appearing thin gentleman sitting comfortably in the exam room in no distress.  HENT:  Head: Normocephalic and atraumatic.  Mouth/Throat: Oropharynx is clear and moist. No oropharyngeal exudate.  Thin graying hair.  Mustache.  Dentures.  Dry mouth.  Mask.  Eyes: Pupils are equal, round, and reactive to light. Conjunctivae and EOM are normal. No scleral icterus.  Glasses.  Gray blue eyes.  Neck: Normal range of motion. Neck supple. No JVD present.  Cardiovascular: Normal rate, regular rhythm and normal heart sounds. Exam reveals no gallop and no friction rub.  No murmur heard. Pulmonary/Chest: Effort normal and breath sounds normal. No respiratory distress. He has no wheezes. He has no rales.  Abdominal: Soft. Bowel sounds are normal. He exhibits no distension and no mass. There is no abdominal tenderness. There is no rebound and no guarding.  Musculoskeletal: Normal range of motion.        General: No tenderness or edema.  Lymphadenopathy:    He has no cervical adenopathy.  Neurological: He is alert and oriented to person, place, and time.  Skin: Skin is warm and dry. No rash noted. He is  not diaphoretic. No erythema. There is pallor.  Psychiatric: He has a normal mood and affect. His behavior is normal. Judgment and thought content normal.  Nursing note and vitals reviewed.   Infusion on 04/25/2019  Component Date Value Ref Range Status  . WBC 04/25/2019 3.6* 4.0 - 10.5 K/uL Final  . RBC 04/25/2019 3.65* 4.22 - 5.81 MIL/uL Final  . Hemoglobin 04/25/2019 10.7* 13.0 - 17.0 g/dL Final  . HCT 04/25/2019 31.6* 39.0 - 52.0 % Final  . MCV 04/25/2019 86.6  80.0 - 100.0 fL Final  . MCH 04/25/2019 29.3  26.0 - 34.0 pg Final  . MCHC 04/25/2019 33.9  30.0 - 36.0 g/dL Final  . RDW 04/25/2019 14.2  11.5 - 15.5 % Final  . Platelets 04/25/2019 221  150 - 400 K/uL Final  . nRBC 04/25/2019 0.0  0.0 - 0.2 % Final  . Neutrophils Relative % 04/25/2019 57  % Final  . Neutro Abs 04/25/2019 2.1  1.7 - 7.7 K/uL Final  . Lymphocytes Relative 04/25/2019 25  % Final  . Lymphs Abs 04/25/2019 0.9  0.7 - 4.0 K/uL Final  . Monocytes Relative 04/25/2019 3  % Final  . Monocytes Absolute 04/25/2019 0.1  0.1 - 1.0 K/uL Final  . Eosinophils Relative 04/25/2019 14  % Final  . Eosinophils Absolute 04/25/2019 0.5  0.0 - 0.5 K/uL Final  . Basophils Relative 04/25/2019 1  % Final  . Basophils Absolute 04/25/2019 0.0  0.0 - 0.1 K/uL Final  . Immature Granulocytes 04/25/2019 0  % Final  . Abs Immature Granulocytes 04/25/2019 0.01  0.00 - 0.07 K/uL Final   Performed at [facility], [address] [place], [place] [address]  Admission on 04/23/2019, Discharged on 04/23/2019  Component Date Value Ref Range Status  . Lipase 04/23/2019 59* 11 - 51 U/L Final   Performed at [facility], [address] [place], [place] [address]  . Sodium 04/23/2019 136  135 - 145 mmol/L Final  . Potassium 04/23/2019 3.9  3.5 - 5.1 mmol/L Final  . Chloride 04/23/2019 103  98 - 111 mmol/L Final  . CO2 04/23/2019 22  22 - 32 mmol/L Final  . Glucose, Bld 04/23/2019 129* 70 - 99 mg/dL Final  . BUN  04/23/2019 18  8 - 23 mg/dL Final  . Creatinine, Ser 04/23/2019 1.12  0.61 - 1.24 mg/dL Final  . Calcium 04/23/2019 8.8* 8.9 - 10.3 mg/dL Final  . Total Protein 04/23/2019 7.0  6.5 - 8.1 g/dL Final  . Albumin 04/23/2019 3.6  3.5 - 5.0 g/dL Final  . AST 04/23/2019 19  15 - 41 U/L Final  . ALT 04/23/2019 14  0 - 44 U/L Final  . Alkaline Phosphatase 04/23/2019 63  38 - 126 U/L Final  . Total Bilirubin 04/23/2019 1.3* 0.3 - 1.2 mg/dL Final  . GFR calc non Af Amer 04/23/2019 >60  >60 mL/min Final  . GFR calc Af Amer 04/23/2019 >60  >60 mL/min Final  . Anion gap 04/23/2019 11  5 - 15 Final   Performed at [facility], [address] [place], [place] [address]  . WBC 04/23/2019 10.6* 4.0 - 10.5 K/uL Final  . RBC 04/23/2019 4.45  4.22 - 5.81 MIL/uL Final  . Hemoglobin 04/23/2019 12.9* 13.0 - 17.0 g/dL Final  . HCT 04/23/2019 39.2  39.0 - 52.0 % Final  . MCV 04/23/2019 88.1  80.0 - 100.0 fL Final  . MCH 04/23/2019 29.0  26.0 - 34.0 pg  Final  . MCHC 04/23/2019 32.9  30.0 - 36.0 g/dL Final  . RDW 04/23/2019 14.3  11.5 - 15.5 % Final  . Platelets 04/23/2019 500* 150 - 400 K/uL Final  . nRBC 04/23/2019 0.0  0.0 - 0.2 % Final   Performed at [facility], [address], [place], [place] [address]  . Color, Urine 04/23/2019 YELLOW* YELLOW Final  . APPearance 04/23/2019 CLEAR* CLEAR Final  . Specific Gravity, Urine 04/23/2019 1.017  1.005 - 1.030 Final  . pH 04/23/2019 6.0  5.0 - 8.0 Final  . Glucose, UA 04/23/2019 NEGATIVE  NEGATIVE mg/dL Final  . Hgb urine dipstick 04/23/2019 NEGATIVE  NEGATIVE Final  . Bilirubin Urine 04/23/2019 NEGATIVE  NEGATIVE Final  . Ketones, ur 04/23/2019 5* NEGATIVE mg/dL Final  . Protein, ur 04/23/2019 30* NEGATIVE mg/dL Final  . Nitrite 04/23/2019 NEGATIVE  NEGATIVE Final  . Leukocytes,Ua 04/23/2019 TRACE* NEGATIVE Final  . RBC / HPF 04/23/2019 0-5  0 - 5 RBC/hpf Final  . WBC, UA 04/23/2019 6-10  0 - 5 WBC/hpf Final  . Bacteria, UA 04/23/2019  NONE SEEN  NONE SEEN Final  . Squamous Epithelial / LPF 04/23/2019 0-5  0 - 5 Final  . Mucus 04/23/2019 PRESENT   Final   Performed at [facility], [address] [place], [place] [address]  . Lactic Acid, Venous 04/23/2019 1.8  0.5 - 1.9 mmol/L Final   Performed at [facility], [address] [place], [place] [address]  . Lactic Acid, Venous 04/23/2019 1.2  0.5 - 1.9 mmol/L Final   Performed at [facility], [address] [place], [place] [address]    Assessment:  Jimmy West is a 68 y.o. male with metastatichigh-grade adenocarcinomaof the right lungs/p CT-guided biopsy of a RLL lung mass on 05/11/2018. Pathologyrevealed an invasive high-grade adenocarcinoma, with predominantly solid growth pattern. The neoplastic cells were TTF-1 (+), Napsin A (+), and P40 (+). He has a T4 vertebral metastasis. Clinical stage is T4N1M1.  There was not enough material for [facility] One testing. PDL-1revealed TPS 90%.  PET scanon 04/30/2018 revealed a 3.4 cm hypermetabolic RLL pulmonary mass (SUV 13.2), 11 mm pulmonary nodule in the LEFT lung (SUV 4.5), RIGHT paratracheal lymph node (SUV 5.1), and hypermetabolic activity within the T4 vertebral body (SUV 10.2).   Thoracic spine MRIon 05/03/2018 revealed T4 metastasiswith a 40% pathologic compression deformity and 5 mm of retropulsion of the vertebral body. Retropulsion results in mild spinal canal stenosis and mild bilateral C4-5 foraminal stenosis. There was paravertebral soft tissue thickening from mid T3 to mid T5, likely representing edema related to the pathologic compression deformity vs. possible extraosseous extension of the neoplasm. There were no additional thoracic spinal metastases noted.   Head MRIon 05/03/2018 revealed no intracranial metastatic disease. There were mild chronic microvascular ischemic changes and volume loss of brain, in addition to small chronic cortical infarctions  within the left parietal lobe and small right caudate head chronic lacunar infarct. Incidental mention made of mild paranasal sinus disease.  Hereceived 11cycles ofpembrolizumab(05/24/2018 - 02/07/2019). He toleratedtreatment well. CEAwas 1.3 on 08/30/2018.LDHwas 165 on 11/29/2018.  He completed T4 radiationon 07/07/2018. He receives Xgevamonthly (06/03/2018 - 04/13/2019).  He received 1 cycle of carboplatin, Alimta, and pembrolizumabon 02/08/2019. Cycle #1 was complicated by nausea, vomiting, and diarrhea necessitating hospitalization.Decision made to hold carboplatin with his second dose.He iss/pcycle #3Alimta and pembrolizumab (02/28/2019 - 03/29/2019).Cycle #2 was complicated by nausea, vomiting, and dehydration requring fluids in clinic and an overnight stay  in the hospital.  He is day 8 of cycle #2 carboplatin (AUC 2), Alimta, and pebrolizumab (04/18/2019).  He hascancer-related painin T4. He is currently taking Fentanyl75 mcg/hr and oxycodone10 mg every 4 hours prn.  Cervical and thoracic spine CT at [facility] 01/10/2019 revealed unchanged severe compression deformity/vertebral plana of T4 with a stable degree of retropulsion and focal mild spinal canal stenosis. There was interval enlargement of a right lower lobe pulmonary mass(4.5 x 3.5 cm compared to 3.9 x 2.9 cm)with redemonstrated spiculated pulmonary nodules.  Chest, abdomen, pelvisCTon 01/28/2019 revealed slight interval enlargement of a right lower lobe mass with central necrosis measuring 4.6 x 3.4 cm, previously 4.0 x 3.0 cm. There was no change in right upper lobe nodules(1.4 and 0.8 cm).Therewas almost no residua of left lung nodules, with irregular opacities in the apical left upper lobeandsuperior segment left lower lobe. There was no change in right hilar soft tissue and lymph nodes.There wasvertebra plana deformity of T4andno evidence of new osseous metastatic disease. No evidence  of distant metastatic disease in the abdomen or pelvis.The1.6 x 1.1 cmleft adrenal nodule(non-metabolic on prior PET scan),was unchanged.  Abdomen and pelvis CT on 04/23/2019 revealed no acute abdominal or pelvic pathology.  There was fluid in the colon which can be seen with diarrhea.  There was diverticulosis without evidence of diverticulitis.  There was a stable 1.5 cm left adrenal nodule.  He had transient renal insufficiency on 03/23/2019.  Creatinine was 1.32 (baseline 0.61-1.0).  Renal ultrasound on 03/24/2019 revealed no acute abnormality identified.There was no hydronephrosis or bladder distention.  Creatinine is 0.82 today.  Symptomatically, he is exhausted.  He has had nausea and vomiting since his last chemotherapy.    Plan: 1.Labs today:CBC with diff, BMP, Mg. 2.Metastatic high-grade adenocarcinoma the RIGHT lung He is s/p 11 cycles of pembrolizumab. He is s/p 1 cycle of carboplatin, Almita, and pembrolizumab (02/08/2019). He is s/p 3 cycles of Alimta and pembrolizumab. He is day 8 of cycle #2 carboplatin (AUC 2), Alimta, and pembrolizumab.  He has had a rough time since chemotherapy with nausea and vomiting. Review plans for restaging CT scans.  Review interval abdomen and pelvic CT. 3.Nausea and vomiting post chemotherapy  Patient has struggled since his last chemotherapy with the addition of low-dose carboplatin.  IV fluids 1 L normal saline today and 8 mg IV ondansetron.  Reassess after IV fluids and ondansetron.   If patient not improved significantly, discuss plan for readmission.  RTC daily this week for IVF and +/- ondansetron. 4.   Renal insufficiency, resolved Creatinine 0.88 today (baseline).  Continue to monitor. 5. Bone metastasis Continue monthly Xgeva (last 04/13/2019).  6. Cancer related pain Pain remains well controlled. Continue Fentanyl 75 mcg patch and oxycodone 10 mg p.o. every 4  hours as needed pain. 7. Anxiety Clinically, he is doing well on low-dose Ativan. 8.   Chest CT on 05/06/2019. 9.   RTC on 05/09/2019 for MD assessment, labs (CBC with diff, CMP, Mg, TSH, T4), review of imaging studies, and cycle #3 Alimta, pembrolizumab, and +/- carboplatin.  I discussed the assessment and treatment plan with the patient.  The patient was provided an opportunity to ask questions and all were answered.  The patient agreed with the plan and demonstrated an understanding of the instructions.  The patient was advised to call back if the symptoms worsen or if the condition fails to improve as anticipated.     Lequita Asal, MD, PhD    04/25/2019, 1:33  PM  I, Samul Dada, am acting as a Education administrator for Lequita Asal, MD.  I, Evans Mike Gip, MD, have reviewed the above documentation for accuracy and completeness, and I agree with the above.

## 2019-04-25 NOTE — Telephone Encounter (Signed)
Telephone call to patient to schedule palliative care visit with patient. Patient in agreement with telephonic visit on 04-29-19 at 1:00 PM.

## 2019-04-25 NOTE — Progress Notes (Signed)
Patient receiving 1 Liter of NS. Patient did eat a package of peanut butter crackers and is drinking a bottle of water. No vomiting while in infusion suite.

## 2019-04-26 ENCOUNTER — Inpatient Hospital Stay: Payer: Medicare Other | Attending: Hospice and Palliative Medicine | Admitting: Hospice and Palliative Medicine

## 2019-04-26 ENCOUNTER — Inpatient Hospital Stay: Payer: Medicare Other

## 2019-04-26 VITALS — BP 98/62 | HR 82 | Temp 97.4°F | Resp 18

## 2019-04-26 DIAGNOSIS — C7801 Secondary malignant neoplasm of right lung: Secondary | ICD-10-CM | POA: Diagnosis not present

## 2019-04-26 DIAGNOSIS — Z515 Encounter for palliative care: Secondary | ICD-10-CM | POA: Diagnosis not present

## 2019-04-26 DIAGNOSIS — C3431 Malignant neoplasm of lower lobe, right bronchus or lung: Secondary | ICD-10-CM | POA: Diagnosis not present

## 2019-04-26 DIAGNOSIS — C7951 Secondary malignant neoplasm of bone: Secondary | ICD-10-CM | POA: Diagnosis not present

## 2019-04-26 DIAGNOSIS — F419 Anxiety disorder, unspecified: Secondary | ICD-10-CM | POA: Diagnosis not present

## 2019-04-26 DIAGNOSIS — Z23 Encounter for immunization: Secondary | ICD-10-CM | POA: Diagnosis not present

## 2019-04-26 DIAGNOSIS — G893 Neoplasm related pain (acute) (chronic): Secondary | ICD-10-CM

## 2019-04-26 MED ORDER — HEPARIN SOD (PORK) LOCK FLUSH 100 UNIT/ML IV SOLN
500.0000 [IU] | Freq: Once | INTRAVENOUS | Status: AC
  Administered 2019-04-26: 500 [IU] via INTRAVENOUS
  Filled 2019-04-26: qty 5

## 2019-04-26 MED ORDER — SODIUM CHLORIDE 0.9 % IV SOLN
Freq: Once | INTRAVENOUS | Status: AC
  Administered 2019-04-26: 14:00:00 via INTRAVENOUS
  Filled 2019-04-26: qty 250

## 2019-04-26 MED ORDER — SODIUM CHLORIDE 0.9 % IV SOLN: Freq: Once | INTRAVENOUS | Status: DC

## 2019-04-26 MED ORDER — SODIUM CHLORIDE 0.9% FLUSH
10.0000 mL | INTRAVENOUS | Status: DC | PRN
  Administered 2019-04-26: 10 mL via INTRAVENOUS
  Filled 2019-04-26: qty 10

## 2019-04-26 MED ORDER — ONDANSETRON HCL 4 MG/2ML IJ SOLN
8.0000 mg | Freq: Once | INTRAMUSCULAR | Status: AC
  Administered 2019-04-26: 8 mg via INTRAVENOUS
  Filled 2019-04-26: qty 4

## 2019-04-26 NOTE — Progress Notes (Signed)
Patient still feeling weak. Decreased appetite continues. No vomiting today. Drank ensure prior to coming into clinic. Will receive zofran and 1 liter of NS today.

## 2019-04-26 NOTE — Progress Notes (Signed)
Virtual Visit via Telephone Note  I connected with Jimmy West on 04/26/19 at  3:00 PM EDT by telephone and verified that I am speaking with the correct person using two identifiers.   I discussed the limitations, risks, security and privacy concerns of performing an evaluation and management service by telephone and the availability of in person appointments. I also discussed with the patient that there may be a patient responsible charge related to this service. The patient expressed understanding and agreed to proceed.   History of Present Illness: Palliative care follow-up for this 68 year old male with multiple medical problems including presumed metastatic lung cancer. Initially,patient presented withpulmonary nodules from chest x-ray ordered by PCP. Subsequently referred to oncology for work-up and was evaluated by Dr. Mike Gip on 04/30/2018. PET scan revealed several hypermetabolic pulmonary nodules and activity within the T4 vertebral body. Follow-up MRI of the spine revealed T4 metastasis with pathological compression deformity and mild spinal canal stenosis with probable edema and extraosseous extension of neoplasm from mid T3 to mid T4. He was referred to palliative care clinic to assist with symptom management and to establish treatment goals.   Observations/Objective: I called patient by phone.  He was seen earlier this week in the ER for dehydration and pain.  Patient says that his symptoms have been worse since he was started on the Aloxi and Alimta.  He is currently receiving IV fluids at the [facility] cancer center.  Patient says that the pain is stable today.  He reports persistent fatigue.  Emotional coping discussed. Patient denies worsening depressive symptoms.   Assessment and Plan: -Continue fluoxetine 40mg  daily -Continue fentanyl 19mcg Q72H (Rx #10) -Continue oxycodone 10-20mg  Q4H prn  Follow Up Instructions: Telephone visit in 2-3 weeks   I discussed  the assessment and treatment plan with the patient. The patient was provided an opportunity to ask questions and all were answered. The patient agreed with the plan and demonstrated an understanding of the instructions.   The patient was advised to call back or seek an in-person evaluation if the symptoms worsen or if the condition fails to improve as anticipated.  I provided 8 minutes of non-face-to-face time during this encounter.   Irean Hong, NP

## 2019-04-27 ENCOUNTER — Inpatient Hospital Stay: Payer: Medicare Other

## 2019-04-27 ENCOUNTER — Other Ambulatory Visit: Payer: Self-pay

## 2019-04-27 VITALS — BP 103/64 | HR 71 | Temp 97.6°F | Resp 18

## 2019-04-27 DIAGNOSIS — F419 Anxiety disorder, unspecified: Secondary | ICD-10-CM | POA: Diagnosis not present

## 2019-04-27 DIAGNOSIS — C7951 Secondary malignant neoplasm of bone: Secondary | ICD-10-CM

## 2019-04-27 DIAGNOSIS — G893 Neoplasm related pain (acute) (chronic): Secondary | ICD-10-CM | POA: Diagnosis not present

## 2019-04-27 DIAGNOSIS — E860 Dehydration: Secondary | ICD-10-CM

## 2019-04-27 DIAGNOSIS — C7801 Secondary malignant neoplasm of right lung: Secondary | ICD-10-CM | POA: Diagnosis not present

## 2019-04-27 DIAGNOSIS — C3431 Malignant neoplasm of lower lobe, right bronchus or lung: Secondary | ICD-10-CM | POA: Diagnosis not present

## 2019-04-27 DIAGNOSIS — Z23 Encounter for immunization: Secondary | ICD-10-CM | POA: Diagnosis not present

## 2019-04-27 MED ORDER — HEPARIN SOD (PORK) LOCK FLUSH 100 UNIT/ML IV SOLN
500.0000 [IU] | Freq: Once | INTRAVENOUS | Status: AC
  Administered 2019-04-27: 500 [IU] via INTRAVENOUS
  Filled 2019-04-27: qty 5

## 2019-04-27 MED ORDER — SODIUM CHLORIDE 0.9 % IV SOLN: Freq: Once | INTRAVENOUS | Status: DC

## 2019-04-27 MED ORDER — SODIUM CHLORIDE 0.9% FLUSH
10.0000 mL | INTRAVENOUS | Status: DC | PRN
  Administered 2019-04-27: 10 mL via INTRAVENOUS
  Filled 2019-04-27: qty 10

## 2019-04-27 MED ORDER — ONDANSETRON HCL 4 MG/2ML IJ SOLN
8.0000 mg | Freq: Once | INTRAMUSCULAR | Status: AC
  Administered 2019-04-27: 8 mg via INTRAVENOUS
  Filled 2019-04-27: qty 4

## 2019-04-27 MED ORDER — SODIUM CHLORIDE 0.9 % IV SOLN
Freq: Once | INTRAVENOUS | Status: AC
  Administered 2019-04-27: 12:00:00 via INTRAVENOUS
  Filled 2019-04-27: qty 250

## 2019-04-29 ENCOUNTER — Other Ambulatory Visit: Payer: Medicare Other

## 2019-04-29 ENCOUNTER — Other Ambulatory Visit: Payer: Self-pay

## 2019-05-02 ENCOUNTER — Telehealth: Payer: Self-pay

## 2019-05-02 NOTE — Telephone Encounter (Signed)
PC to patient to schedule palliative care visit.  Patient did not answer phone to complete telehealth visit on Friday 04-28-19.  RN LM requesting call back to schedule visit.

## 2019-05-05 ENCOUNTER — Other Ambulatory Visit: Payer: Self-pay | Admitting: *Deleted

## 2019-05-05 DIAGNOSIS — C3431 Malignant neoplasm of lower lobe, right bronchus or lung: Secondary | ICD-10-CM

## 2019-05-05 MED ORDER — FLUOXETINE HCL 40 MG PO CAPS: 40.0000 mg | ORAL_CAPSULE | Freq: Every day | ORAL | 3 refills | Status: DC

## 2019-05-05 MED ORDER — FOLIC ACID 1 MG PO TABS: 1.0000 mg | ORAL_TABLET | Freq: Every day | ORAL | 3 refills | Status: DC

## 2019-05-05 NOTE — Telephone Encounter (Signed)
Pt requesting refill of generic prozac and folic acid. Refills have been sent into his pharmacy. Nothing further needed.

## 2019-05-06 ENCOUNTER — Encounter: Payer: Self-pay | Admitting: Hematology and Oncology

## 2019-05-06 ENCOUNTER — Ambulatory Visit
Admission: RE | Admit: 2019-05-06 | Discharge: 2019-05-06 | Disposition: A | Discharge status: Home / Self Care | Payer: Medicare Other | Source: Ambulatory Visit | Attending: Hematology and Oncology | Admitting: Hematology and Oncology

## 2019-05-06 ENCOUNTER — Telehealth: Payer: Self-pay

## 2019-05-06 DIAGNOSIS — C3491 Malignant neoplasm of unspecified part of right bronchus or lung: Secondary | ICD-10-CM | POA: Diagnosis not present

## 2019-05-06 DIAGNOSIS — C7951 Secondary malignant neoplasm of bone: Secondary | ICD-10-CM | POA: Diagnosis not present

## 2019-05-06 DIAGNOSIS — C3431 Malignant neoplasm of lower lobe, right bronchus or lung: Secondary | ICD-10-CM | POA: Insufficient documentation

## 2019-05-06 MED ORDER — IOHEXOL 300 MG/ML  SOLN
75.0000 mL | Freq: Once | INTRAMUSCULAR | Status: AC | PRN
  Administered 2019-05-06: 75 mL via INTRAVENOUS

## 2019-05-06 NOTE — Telephone Encounter (Signed)
Telephone call to patient to schedule palliatuve care visit.  Patient did not answer phone.  RN left message for patient requesting call back to schedule palliative care visit.

## 2019-05-06 NOTE — Progress Notes (Signed)
The patient c/o lower back pain ( pain level 4) occ. The patient Name and DOB has been verified.

## 2019-05-08 NOTE — Progress Notes (Signed)
[facility]  [address] [place], [place] [address] Phone: [phone]  Fax: [phone]   Clinic Day:  05/09/2019  Referring physician: [name], NP  Chief Complaint: Jimmy West is a 68 y.o. male with metastatic adenocarcinoma of the lung who is seen for assessment prior to cycle #5 of carboplatin, Alimta, and pembrolizumab.   HPI: The patient was last seen in the medical oncology clinic on 04/25/2019. At that time, he was seen for sick call visit on day 8 of cycle #4 carboplatin, Alimta, and pembrolizumab.  Symptomatically, he was exhausted.  He had had nausea and vomiting since his last chemotherapy. Lab results revealed a hematocrit 31.6, hemoglobin 10.7, MCV 86.6, platelets 221,000, WBC 3,600, and ANC 2,100.   He received 1 liter IVF and ondansetron.  He returned to clinic daily on 04/26/2019 and 04/27/2019 for additional IVF and ondansetron.  Chest, abdomen, and pelvis CT on 05/06/2019 revealed a positive response to interval therapy for the known right lower lobe lung carcinoma (4.6 x 3.4 cm to 3.1 x 2.8 cm transversely).  There had been a mild interval decrease contiguous soft tissue that extends along the right hilum. The two right upper lobe nodules were stable from the most recent prior study (smaller than 04/27/2018). There are no new lung nodules. There was no evidence of new metastatic disease.  There was stable severe compression deformity/vertebra plana of T4. There was no evidence of other osseous metastatic disease.  There was a stable left adrenal nodule consistent with an adenoma.  During the interim, he has been "ok". He has continued back pain, which he has noticed is aggravated by laying down for extended periods of time. Overall, his back pain is mildly improved.   He has been working some (2 days last week).  His last day will be 06/03/2019 due to the business's financial difficulty amid COVID-19. He is unsure if  he will immediately go back to work or find a new location.   He is interested in receiving a flu shot.  We discussed receiving a flu shot when he is not on Decadron around his chemotherapy.  He is agreeable to come in 1 week to receive his influenza vaccine.   He is due for his Xgeva on 05/11/2019. We will schedule this for 05/16/2019 to correlate with his flu shot.  He will be due for his B12 injection on 05/25/2019.    Past Medical History:  Diagnosis Date   Bulging lumbar disc    Cancer ([name])    stage 4 lung cancer   Heart attack [name]     Past Surgical History:  Procedure Laterality Date   CARDIAC CATHETERIZATION     two stents   KNEE SURGERY Left    PORTA CATH INSERTION N/A 05/21/2018   Procedure: PORTA CATH INSERTION;  Surgeon: Algernon Huxley, MD;  Location: [facility];  Service: Cardiovascular;  Laterality: N/A;    Family History  Problem Relation Age of Onset   Heart failure Father    Cancer Maternal Aunt    Heart failure Maternal Uncle    Dementia Paternal Grandmother    Cancer Maternal Aunt     Social History:  reports that he quit smoking about 15 years ago. His smoking use included cigarettes. He has a 22.50 pack-year smoking history. He has never used smokeless tobacco. He reports current alcohol use of about 15.0 standard drinks of alcohol per week. He reports that he does  not use drugs. He started smoking at age 48. He is smoking 1 1/2 packs/day. Patient denies known exposures to radiation ortoxins. 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  The patient is alone today.  Allergies: No Known Allergies  Current Medications: Current Outpatient Medications  Medication Sig Dispense Refill   ALPRAZolam (XANAX) 0.25 MG tablet Take 1 tablet (0.25 mg total) by mouth 3 (three) times daily as needed for anxiety. 45 tablet 0   aspirin EC 81 MG tablet Take 81 mg by mouth daily.      Calcium 600-400 MG-UNIT CHEW Chew 2 tablets by mouth daily.     fentaNYL (DURAGESIC) 75 MCG/HR Place 1 patch onto the skin every 3 (three) days. 10 patch 0   FLUoxetine (PROZAC) 40 MG capsule Take 1 capsule (40 mg total) by mouth daily. 30 capsule 3   folic acid (FOLVITE) 1 MG tablet Take 1 tablet (1 mg total) by mouth daily. Start 5-7 days before Alimta chemotherapy. Continue until 21 days after Alimta completed. 100 tablet 3   Oxycodone HCl 20 MG TABS Take 0.5-1 tablets (10-20 mg total) by mouth every 4 (four) hours as needed (for severe pain). 90 tablet 0   acetaminophen (TYLENOL) 500 MG tablet Take 500 mg by mouth 3 (three) times daily as needed.     dexamethasone (DECADRON) 4 MG tablet Take 1 tab two times a day the day before Alimta chemo, then take 2 tabs once a day for 3 days starting the day after chemo. (Patient not taking: Reported on 04/25/2019) 30 tablet 1   ibuprofen (ADVIL,MOTRIN) 200 MG tablet Take 200 mg by mouth every 6 (six) hours as needed.     ondansetron (ZOFRAN ODT) 4 MG disintegrating tablet Take 1 tablet (4 mg total) by mouth every 6 (six) hours as needed for nausea or vomiting. (Patient not taking: Reported on 05/06/2019) 30 tablet 1   pantoprazole (PROTONIX) 40 MG tablet Take 1 tablet (40 mg total) by mouth daily. (Patient not taking: Reported on 05/06/2019) 30 tablet 1   polyethylene glycol (MIRALAX / GLYCOLAX) packet Take 17 g by mouth daily.     senna (SENOKOT) 8.6 MG tablet Take 1 tablet by mouth as needed.      No current facility-administered medications for this visit.    Facility-Administered Medications Ordered in Other Visits  Medication Dose Route Frequency Provider Last Rate Last Dose   0.9 %  sodium chloride infusion   Intravenous Once Glynnis Gavel C, MD       0.9 %  sodium chloride infusion   Intravenous Once PRN Gerrica Cygan C, MD       0.9 %  sodium chloride infusion   Intravenous Once Earlean Fidalgo C, MD       albuterol  (PROVENTIL) (2.5 MG/3ML) 0.083% nebulizer solution 2.5 mg  2.5 mg Nebulization Once PRN Norm Wray C, MD       alteplase (CATHFLO ACTIVASE) injection 2 mg  2 mg Intracatheter Once PRN Lequita Asal, MD       EPINEPHrine (ADRENALIN) 1 MG/10ML injection 0.25 mg  0.25 mg Intravenous Once PRN Kaiulani Sitton C, MD       EPINEPHrine (ADRENALIN) 1 MG/10ML injection 0.25 mg  0.25 mg Intravenous Once PRN Demir Titsworth C, MD       heparin lock flush 100 unit/mL  500 Units Intracatheter Once PRN Lequita Asal, MD       heparin lock  flush 100 unit/mL  250 Units Intracatheter Once PRN Lequita Asal, MD       heparin lock flush 100 unit/mL  500 Units Intravenous Once Vaneza Pickart, Drue Second, MD       palonosetron (ALOXI) injection 0.25 mg  0.25 mg Intravenous Once Kimmy Parish, Drue Second, MD       pembrolizumab (KEYTRUDA) 200 mg in sodium chloride 0.9 % 50 mL chemo infusion  200 mg Intravenous Once Greydon Betke C, MD       PEMEtrexed (ALIMTA) 900 mg in sodium chloride 0.9 % 100 mL chemo infusion  500 mg/m2 (Treatment Plan Recorded) Intravenous Once Lequita Asal, MD       sodium chloride flush (NS) 0.9 % injection 10 mL  10 mL Intravenous PRN Nolon Stalls C, MD   10 mL at 03/04/19 0959   sodium chloride flush (NS) 0.9 % injection 10 mL  10 mL Intracatheter Once PRN Lequita Asal, MD       sodium chloride flush (NS) 0.9 % injection 10 mL  10 mL Intravenous PRN Nolon Stalls C, MD   10 mL at 05/09/19 0912   sodium chloride flush (NS) 0.9 % injection 3 mL  3 mL Intracatheter Once PRN Lequita Asal, MD        Review of Systems  Constitutional: Negative for chills, diaphoresis, fever, malaise/fatigue and weight loss (up 7 lbs).       Feels "much better".  HENT: Negative for congestion, ear pain, nosebleeds, sinus pain and sore throat.        Rhinitis.  Eyes: Negative.  Negative for blurred vision, double vision, discharge and redness.    Respiratory: Negative.  Negative for cough, hemoptysis, sputum production and shortness of breath.   Cardiovascular: Negative.  Negative for chest pain, palpitations, orthopnea, leg swelling and PND.  Gastrointestinal: Negative for abdominal pain, blood in stool, constipation, diarrhea, melena, nausea and vomiting.       Eating better.  Genitourinary: Negative.  Negative for dysuria, frequency, hematuria and urgency.  Musculoskeletal: Positive for back pain (low back 4 out of 10 pain). Negative for joint pain, myalgias and neck pain.  Skin: Negative.  Negative for rash.  Neurological: Negative for dizziness, tingling, tremors, sensory change, speech change, focal weakness, weakness and headaches.       Restless legs.  Endo/Heme/Allergies: Negative.  Negative for environmental allergies. Does not bruise/bleed easily.  Psychiatric/Behavioral: Negative for depression and memory loss. The patient is nervous/anxious (on Xanax). The patient does not have insomnia.   All other systems reviewed and are negative.  Performance status (ECOG):  1  Vitals Blood pressure 125/76, pulse 65, temperature (!) 97.1 F (36.2 C), temperature source Tympanic, resp. rate 18, weight 147 lb 4.3 oz (66.8 kg), SpO2 97 %.   Physical Exam  Constitutional: He is oriented to person, place, and time.  Thin gentleman sitting comfortably in the exam room in no distress.  HENT:  Head: Normocephalic and atraumatic.  Mouth/Throat: Oropharynx is clear and moist. No oropharyngeal exudate.  Thin graying hair.  Mustache.  Dentures.  Mask.  Eyes: Pupils are equal, round, and reactive to light. Conjunctivae and EOM are normal. No scleral icterus.  Glasses.  Gray blue eyes.  Neck: Normal range of motion. Neck supple. No JVD present.  Cardiovascular: Normal rate, regular rhythm and normal heart sounds. Exam reveals no gallop and no friction rub.  No murmur heard. Pulmonary/Chest: Effort normal and breath sounds normal. No  respiratory distress. He has no  wheezes. He has no rales.  Abdominal: Soft. Bowel sounds are normal. He exhibits no distension and no mass. There is no abdominal tenderness. There is no rebound and no guarding.  Musculoskeletal: Normal range of motion.        General: No tenderness or edema.  Lymphadenopathy:       Head (right side): No preauricular, no posterior auricular and no occipital adenopathy present.       Head (left side): No preauricular, no posterior auricular and no occipital adenopathy present.    He has no cervical adenopathy.    He has no axillary adenopathy.       Right: No inguinal and no supraclavicular adenopathy present.       Left: No inguinal and no supraclavicular adenopathy present.  Neurological: He is alert and oriented to person, place, and time.  Skin: Skin is warm and dry. No rash noted. He is not diaphoretic. No erythema. No pallor.  Psychiatric: He has a normal mood and affect. His behavior is normal. Judgment and thought content normal.  Nursing note and vitals reviewed.   Infusion on 05/09/2019  Component Date Value Ref Range Status   Sodium 05/09/2019 134* 135 - 145 mmol/L Final   Potassium 05/09/2019 4.2  3.5 - 5.1 mmol/L Final   Chloride 05/09/2019 103  98 - 111 mmol/L Final   CO2 05/09/2019 21* 22 - 32 mmol/L Final   Glucose, Bld 05/09/2019 132* 70 - 99 mg/dL Final   BUN 05/09/2019 12  8 - 23 mg/dL Final   Creatinine, Ser 05/09/2019 0.88  0.61 - 1.24 mg/dL Final   Calcium 05/09/2019 9.1  8.9 - 10.3 mg/dL Final   Total Protein 05/09/2019 7.2  6.5 - 8.1 g/dL Final   Albumin 05/09/2019 3.9  3.5 - 5.0 g/dL Final   AST 05/09/2019 22  15 - 41 U/L Final   ALT 05/09/2019 18  0 - 44 U/L Final   Alkaline Phosphatase 05/09/2019 70  38 - 126 U/L Final   Total Bilirubin 05/09/2019 0.4  0.3 - 1.2 mg/dL Final   GFR calc non Af Amer 05/09/2019 >60  >60 mL/min Final   GFR calc Af Amer 05/09/2019 >60  >60 mL/min Final   Anion gap 05/09/2019 10   5 - 15 Final   Performed at [facility], [address], [place], [place] [address]   WBC 05/09/2019 5.1  4.0 - 10.5 K/uL Final   RBC 05/09/2019 3.49* 4.22 - 5.81 MIL/uL Final   Hemoglobin 05/09/2019 10.2* 13.0 - 17.0 g/dL Final   HCT 05/09/2019 31.6* 39.0 - 52.0 % Final   MCV 05/09/2019 90.5  80.0 - 100.0 fL Final   MCH 05/09/2019 29.2  26.0 - 34.0 pg Final   MCHC 05/09/2019 32.3  30.0 - 36.0 g/dL Final   RDW 05/09/2019 15.8* 11.5 - 15.5 % Final   Platelets 05/09/2019 346  150 - 400 K/uL Final   nRBC 05/09/2019 0.0  0.0 - 0.2 % Final   Neutrophils Relative % 05/09/2019 73  % Final   Neutro Abs 05/09/2019 3.6  1.7 - 7.7 K/uL Final   Lymphocytes Relative 05/09/2019 10  % Final   Lymphs Abs 05/09/2019 0.5* 0.7 - 4.0 K/uL Final   Monocytes Relative 05/09/2019 16  % Final   Monocytes Absolute 05/09/2019 0.8  0.1 - 1.0 K/uL Final   Eosinophils Relative 05/09/2019 0  % Final   Eosinophils Absolute 05/09/2019 0.0  0.0 - 0.5 K/uL Final   Basophils  Relative 05/09/2019 0  % Final   Basophils Absolute 05/09/2019 0.0  0.0 - 0.1 K/uL Final   Immature Granulocytes 05/09/2019 1  % Final   Abs Immature Granulocytes 05/09/2019 0.04  0.00 - 0.07 K/uL Final   Performed at [facility], [address] [place], [place] [address]    Assessment:  Jimmy West is a 68 y.o. male with metastatichigh-grade adenocarcinomaof the right lungs/p CT-guided biopsy of a RLL lung mass on 05/11/2018. Pathologyrevealed an invasive high-grade adenocarcinoma, with predominantly solid growth pattern. The neoplastic cells were TTF-1 (+), Napsin A (+), and P40 (+). He has a T4 vertebral metastasis. Clinical stage is T4N1M1.  There was not enough material for Foundation One testing. PDL-1revealed TPS 90%.  PET scanon 04/30/2018 revealed a 3.4 cm hypermetabolic RLL pulmonary mass (SUV 13.2), 11 mm pulmonary nodule in the LEFT lung (SUV 4.5), RIGHT paratracheal  lymph node (SUV 5.1), and hypermetabolic activity within the T4 vertebral body (SUV 10.2).   Thoracic spine MRIon 05/03/2018 revealed T4 metastasiswith a 40% pathologic compression deformity and 5 mm of retropulsion of the vertebral body. Retropulsion results in mild spinal canal stenosis and mild bilateral C4-5 foraminal stenosis. There was paravertebral soft tissue thickening from mid T3 to mid T5, likely representing edema related to the pathologic compression deformity vs. possible extraosseous extension of the neoplasm. There were no additional thoracic spinal metastases noted.   Head MRIon 05/03/2018 revealed no intracranial metastatic disease. 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 Incidental mention made of mild paranasal sinus disease.  Hereceived 11cycles ofpembrolizumab(05/24/2018 - 02/07/2019). He toleratedtreatment well. CEAwas 1.3 on 08/30/2018.LDHwas 165 on 11/29/2018.  He completed T4 radiationon 07/07/2018. He receives Xgevamonthly (06/03/2018 - 04/13/2019).  He received 1 cycle of carboplatin, Alimta, and pembrolizumabon 02/08/2019. Cycle #1 was complicated by nausea, vomiting, and diarrhea necessitating hospitalization.Decision made to hold carboplatin with his second dose.He iss/pcycle #3Alimta and pembrolizumab (02/28/2019 - 03/29/2019).Cycle #2 was complicated by nausea, vomiting, and dehydration requring fluids in clinic and an overnight stay in the hospital.  He is day 22 s/p cycle #2 carboplatin (AUC 2), Alimta, and pebrolizumab (04/18/2019).  He hascancer-related painin T4. He is currently taking Fentanyl75 mcg/hr and oxycodone10 mg every 4 hours prn.  Cervical and thoracic spine CT at [facility] 01/10/2019 revealed unchanged severe compression  deformity/vertebral plana of T4 with a stable degree of retropulsion and focal mild spinal canal stenosis. There was interval enlargement of a right lower lobe pulmonary mass(4.5 x 3.5 cm compared to 3.9 x 2.9 cm)with redemonstrated spiculated pulmonary nodules.  Chest, abdomen, pelvisCTon 01/28/2019 revealed slight interval enlargement of a right lower lobe mass with central necrosis measuring 4.6 x 3.4 cm, previously 4.0 x 3.0 cm. There was no change in right upper lobe nodules(1.4 and 0.8 cm).Therewas almost no residua of left lung nodules, with irregular opacities in the apical left upper lobeandsuperior segment left lower lobe. There was no change in right hilar soft tissue and lymph nodes.There wasvertebra plana deformity of T4andno evidence of new osseous metastatic disease. No evidence of distant metastatic disease in the abdomen or pelvis.The1.6 x 1.1 cmleft adrenal nodule(non-metabolic on prior PET scan),was unchanged.  Abdomen and pelvis CT on 04/23/2019 revealed no acute abdominal or pelvic pathology.  There was fluid in the colon which can be seen with diarrhea.  There was diverticulosis without evidence of  diverticulitis.  There was a stable 1.5 cm left adrenal nodule.  Chest, abdomen, and pelvis CT on 05/06/2019 revealed a positive response to interval therapy for the known right lower lobe lung carcinoma (4.6 x 3.4 cm to 3.1 x 2.8 cm transversely).  There had been a mild interval decrease contiguous soft tissue that extends along the right hilum. The two right upper lobe nodules were stable from the most recent prior study (smaller than 04/27/2018). There are no new lung nodules. There was no evidence of new metastatic disease.  There was stable severe compression deformity/vertebra plana of T4. There was no evidence of other osseous metastatic disease.  There was a stable left adrenal nodule consistent with an adenoma.  He had transient renal insufficiency on 03/23/2019.   Creatinine was 1.32 (baseline 0.61-1.0).  Renal ultrasound on 03/24/2019 revealed no acute abnormality identified.There was no hydronephrosis or bladder distention.  Creatinine is 0.82 today.  Symptomatically, he is doing well.  He has recovered from his last cycle.  He has gained weight.  Exam is stable.  Plan: 1.   Labs today:CBC with diff, CMP, Mg, TSH, free T4. 2. Metastatic high-grade adenocarcinoma the RIGHT lung He is s/p 11 cycles of pembrolizumab. He is s/p 1 cycle of carboplatin, Almita, and pembrolizumab (02/08/2019). He is s/p 3 cycles of Alimta and pembrolizumab. He is s/p cycle #2 carboplatin (AUC 2), Alimta, and pembrolizumab.  Discuss difficulty with carboplatin again.  Discuss holding carboplatin secondary to improvement in scans. Review interval CT scans.  Imaging personally reviewed.  Agree with radiology.  Decrease in RUL mass.  No new lesions. Begin cycle #4 Alimta and pembrolizumab. Discuss symptom management.  He has antiemetics and pain medications at home to use on a prn bases.  Interventions are adequate.    3.   Nausea and vomiting post chemotherapy  He struggled with last cycle that included carboplatin.  Anticipate he will do better with Alimta and pembrolizumab alone.  RTC daily this week for +/- IVF and +/- anti-emetics. 4.   Renal insufficiency, resolved Creatinine 0.88 today (stable).  Continue to monitor. 5. Bone metastasis  Discuss plan for follow-up with [facility] neurosurgery at the [facility] re: T4 pathologic fracture.  Last Xgeva on 04/13/2019.  RTC next week for Xegva.  6. Cancer related pain Pain is well controlled. Continue Fentanyl 75 mcg patch and oxycodone 10 mg p.o. every 4 hours as needed pain. 7. Anxiety Continue low dose Ativan.. 8.   Consult neurosurgery (Dr Izora Ribas) re: T4 compression fracture. 9.   RTC daily this week for +/- IVF and +/- anti-emetics. 10.   RTC on  10/26 for labs (BMP, albumen), Xgeva, and influenza vaccine. 11.   RTC on 11/04 for B12 injection. 12.   RTC in 3 weeks for MD assessment, labs (CBC with diff, CMP, Mg, TSH, free T4), and cycle #5 Alimta and pembrolizumab.  I discussed the assessment and treatment plan with the patient.  The patient was provided an opportunity to ask questions and all were answered.  The patient agreed with the plan and demonstrated an understanding of the instructions.  The patient was advised to call back if the symptoms worsen or if the condition fails to improve as anticipated.   I provided 24 minutes (9:38 AM - 10:01 AM) of face-to-face time during this this encounter and > 50% was spent counseling as documented under my assessment and plan.    Lequita Asal, MD, PhD    05/09/2019, 10:04 AM  I, [employer], am acting as a Education administrator for [employer]. Mike Gip, MD.   I, Ivelisse Culverhouse C. Mike Gip, MD, have reviewed the above documentation for accuracy and completeness, and I agree with the above.

## 2019-05-09 ENCOUNTER — Inpatient Hospital Stay (HOSPITAL_BASED_OUTPATIENT_CLINIC_OR_DEPARTMENT_OTHER): Payer: Medicare Other | Admitting: Hematology and Oncology

## 2019-05-09 ENCOUNTER — Telehealth: Payer: Self-pay

## 2019-05-09 ENCOUNTER — Inpatient Hospital Stay: Payer: Medicare Other

## 2019-05-09 ENCOUNTER — Encounter: Payer: Self-pay | Admitting: Hematology and Oncology

## 2019-05-09 ENCOUNTER — Other Ambulatory Visit: Payer: Self-pay

## 2019-05-09 VITALS — BP 118/72 | HR 81 | Temp 97.0°F | Resp 18

## 2019-05-09 VITALS — BP 125/76 | HR 65 | Temp 97.1°F | Resp 18 | Wt 147.3 lb

## 2019-05-09 DIAGNOSIS — C3431 Malignant neoplasm of lower lobe, right bronchus or lung: Secondary | ICD-10-CM

## 2019-05-09 DIAGNOSIS — G893 Neoplasm related pain (acute) (chronic): Secondary | ICD-10-CM | POA: Diagnosis not present

## 2019-05-09 DIAGNOSIS — C7951 Secondary malignant neoplasm of bone: Secondary | ICD-10-CM

## 2019-05-09 DIAGNOSIS — C7801 Secondary malignant neoplasm of right lung: Secondary | ICD-10-CM | POA: Diagnosis not present

## 2019-05-09 DIAGNOSIS — Z5111 Encounter for antineoplastic chemotherapy: Secondary | ICD-10-CM

## 2019-05-09 DIAGNOSIS — F419 Anxiety disorder, unspecified: Secondary | ICD-10-CM | POA: Diagnosis not present

## 2019-05-09 DIAGNOSIS — Z5112 Encounter for antineoplastic immunotherapy: Secondary | ICD-10-CM | POA: Diagnosis not present

## 2019-05-09 DIAGNOSIS — Z23 Encounter for immunization: Secondary | ICD-10-CM | POA: Diagnosis not present

## 2019-05-09 LAB — CBC WITH DIFFERENTIAL/PLATELET
Abs Immature Granulocytes: 0.04 10*3/uL (ref 0.00–0.07)
Basophils Absolute: 0 10*3/uL (ref 0.0–0.1)
Basophils Relative: 0 %
Eosinophils Absolute: 0 10*3/uL (ref 0.0–0.5)
Eosinophils Relative: 0 %
HCT: 31.6 % — ABNORMAL LOW (ref 39.0–52.0)
Hemoglobin: 10.2 g/dL — ABNORMAL LOW (ref 13.0–17.0)
Immature Granulocytes: 1 %
Lymphocytes Relative: 10 %
Lymphs Abs: 0.5 10*3/uL — ABNORMAL LOW (ref 0.7–4.0)
MCH: 29.2 pg (ref 26.0–34.0)
MCHC: 32.3 g/dL (ref 30.0–36.0)
MCV: 90.5 fL (ref 80.0–100.0)
Monocytes Absolute: 0.8 10*3/uL (ref 0.1–1.0)
Monocytes Relative: 16 %
Neutro Abs: 3.6 10*3/uL (ref 1.7–7.7)
Neutrophils Relative %: 73 %
Platelets: 346 10*3/uL (ref 150–400)
RBC: 3.49 MIL/uL — ABNORMAL LOW (ref 4.22–5.81)
RDW: 15.8 % — ABNORMAL HIGH (ref 11.5–15.5)
WBC: 5.1 10*3/uL (ref 4.0–10.5)
nRBC: 0 % (ref 0.0–0.2)

## 2019-05-09 LAB — COMPREHENSIVE METABOLIC PANEL
ALT: 18 U/L (ref 0–44)
AST: 22 U/L (ref 15–41)
Albumin: 3.9 g/dL (ref 3.5–5.0)
Alkaline Phosphatase: 70 U/L (ref 38–126)
Anion gap: 10 (ref 5–15)
BUN: 12 mg/dL (ref 8–23)
CO2: 21 mmol/L — ABNORMAL LOW (ref 22–32)
Calcium: 9.1 mg/dL (ref 8.9–10.3)
Chloride: 103 mmol/L (ref 98–111)
Creatinine, Ser: 0.88 mg/dL (ref 0.61–1.24)
GFR calc Af Amer: >60 mL/min (ref >60)
GFR calc non Af Amer: >60 mL/min (ref >60)
Glucose, Bld: 132 mg/dL — ABNORMAL HIGH (ref 70–99)
Potassium: 4.2 mmol/L (ref 3.5–5.1)
Sodium: 134 mmol/L — ABNORMAL LOW (ref 135–145)
Total Bilirubin: 0.4 mg/dL (ref 0.3–1.2)
Total Protein: 7.2 g/dL (ref 6.5–8.1)

## 2019-05-09 LAB — TSH: TSH: 0.586 u[IU]/mL (ref 0.350–4.500)

## 2019-05-09 MED ORDER — SODIUM CHLORIDE 0.9 % IV SOLN
Freq: Once | INTRAVENOUS | Status: AC
  Administered 2019-05-09: 10:00:00 via INTRAVENOUS
  Filled 2019-05-09: qty 250

## 2019-05-09 MED ORDER — SODIUM CHLORIDE 0.9 % IV SOLN
200.0000 mg | Freq: Once | INTRAVENOUS | Status: AC
  Administered 2019-05-09: 200 mg via INTRAVENOUS
  Filled 2019-05-09: qty 8

## 2019-05-09 MED ORDER — PALONOSETRON HCL INJECTION 0.25 MG/5ML
0.2500 mg | Freq: Once | INTRAVENOUS | Status: AC
  Administered 2019-05-09: 0.25 mg via INTRAVENOUS
  Filled 2019-05-09: qty 5

## 2019-05-09 MED ORDER — SODIUM CHLORIDE 0.9 % IV SOLN
500.0000 mg/m2 | Freq: Once | INTRAVENOUS | Status: AC
  Administered 2019-05-09: 900 mg via INTRAVENOUS
  Filled 2019-05-09: qty 16

## 2019-05-09 MED ORDER — HEPARIN SOD (PORK) LOCK FLUSH 100 UNIT/ML IV SOLN
500.0000 [IU] | Freq: Once | INTRAVENOUS | Status: AC
  Administered 2019-05-09: 500 [IU] via INTRAVENOUS
  Filled 2019-05-09: qty 5

## 2019-05-09 MED ORDER — SODIUM CHLORIDE 0.9% FLUSH
10.0000 mL | INTRAVENOUS | Status: DC | PRN
  Administered 2019-05-09: 10 mL via INTRAVENOUS
  Filled 2019-05-09: qty 10

## 2019-05-09 NOTE — Progress Notes (Signed)
Patient here for follow up. Patient denies any concerns. States he feels much better.

## 2019-05-09 NOTE — Telephone Encounter (Signed)
New patient referral faxed to Dr. Rhea Bleacher office. Fax confirmation received.

## 2019-05-10 ENCOUNTER — Other Ambulatory Visit: Payer: Medicare Other

## 2019-05-10 ENCOUNTER — Ambulatory Visit: Payer: Medicare Other

## 2019-05-10 ENCOUNTER — Ambulatory Visit: Payer: Medicare Other | Admitting: Hematology and Oncology

## 2019-05-10 ENCOUNTER — Inpatient Hospital Stay: Payer: Medicare Other

## 2019-05-10 LAB — T4: T4, Total: 8.1 ug/dL (ref 4.5–12.0)

## 2019-05-11 ENCOUNTER — Other Ambulatory Visit: Payer: Self-pay

## 2019-05-11 ENCOUNTER — Inpatient Hospital Stay: Payer: Medicare Other

## 2019-05-11 ENCOUNTER — Other Ambulatory Visit: Payer: Self-pay | Admitting: *Deleted

## 2019-05-11 VITALS — BP 110/64 | HR 65 | Temp 97.6°F | Resp 17

## 2019-05-11 DIAGNOSIS — C7951 Secondary malignant neoplasm of bone: Secondary | ICD-10-CM

## 2019-05-11 DIAGNOSIS — C3431 Malignant neoplasm of lower lobe, right bronchus or lung: Secondary | ICD-10-CM | POA: Diagnosis not present

## 2019-05-11 DIAGNOSIS — F419 Anxiety disorder, unspecified: Secondary | ICD-10-CM

## 2019-05-11 DIAGNOSIS — E860 Dehydration: Secondary | ICD-10-CM

## 2019-05-11 DIAGNOSIS — G893 Neoplasm related pain (acute) (chronic): Secondary | ICD-10-CM | POA: Diagnosis not present

## 2019-05-11 DIAGNOSIS — C7801 Secondary malignant neoplasm of right lung: Secondary | ICD-10-CM | POA: Diagnosis not present

## 2019-05-11 DIAGNOSIS — Z23 Encounter for immunization: Secondary | ICD-10-CM | POA: Diagnosis not present

## 2019-05-11 MED ORDER — SODIUM CHLORIDE 0.9 % IV SOLN: Freq: Once | INTRAVENOUS | Status: DC

## 2019-05-11 MED ORDER — SODIUM CHLORIDE 0.9% FLUSH
10.0000 mL | INTRAVENOUS | Status: DC | PRN
  Administered 2019-05-11: 10 mL via INTRAVENOUS
  Filled 2019-05-11: qty 10

## 2019-05-11 MED ORDER — HEPARIN SOD (PORK) LOCK FLUSH 100 UNIT/ML IV SOLN
500.0000 [IU] | Freq: Once | INTRAVENOUS | Status: AC
  Administered 2019-05-11: 500 [IU] via INTRAVENOUS
  Filled 2019-05-11: qty 5

## 2019-05-11 MED ORDER — SODIUM CHLORIDE 0.9 % IV SOLN
Freq: Once | INTRAVENOUS | Status: AC
  Administered 2019-05-11: 13:00:00 via INTRAVENOUS
  Filled 2019-05-11: qty 250

## 2019-05-11 MED ORDER — ALPRAZOLAM 0.5 MG PO TABS: 0.5000 mg | ORAL_TABLET | Freq: Three times a day (TID) | ORAL | 0 refills | Status: DC | PRN

## 2019-05-11 MED ORDER — ONDANSETRON HCL 4 MG/2ML IJ SOLN
8.0000 mg | Freq: Once | INTRAMUSCULAR | Status: AC
  Administered 2019-05-11: 8 mg via INTRAVENOUS
  Filled 2019-05-11: qty 4

## 2019-05-11 NOTE — Telephone Encounter (Signed)
Pt requesting refill of xanax and asks if dosage could be increased since pt does not feel like current dosage is helping relieve his anxiety. Per Josh, okay to increase to 0.5mg  TID PRN.

## 2019-05-12 ENCOUNTER — Inpatient Hospital Stay: Payer: Medicare Other

## 2019-05-13 ENCOUNTER — Inpatient Hospital Stay: Payer: Medicare Other

## 2019-05-16 ENCOUNTER — Other Ambulatory Visit: Payer: Self-pay

## 2019-05-16 ENCOUNTER — Inpatient Hospital Stay: Payer: Medicare Other

## 2019-05-16 VITALS — BP 103/67 | HR 85 | Temp 97.6°F | Resp 17

## 2019-05-16 DIAGNOSIS — G893 Neoplasm related pain (acute) (chronic): Secondary | ICD-10-CM | POA: Diagnosis not present

## 2019-05-16 DIAGNOSIS — C7801 Secondary malignant neoplasm of right lung: Secondary | ICD-10-CM | POA: Diagnosis not present

## 2019-05-16 DIAGNOSIS — C7951 Secondary malignant neoplasm of bone: Secondary | ICD-10-CM | POA: Diagnosis not present

## 2019-05-16 DIAGNOSIS — C3431 Malignant neoplasm of lower lobe, right bronchus or lung: Secondary | ICD-10-CM

## 2019-05-16 DIAGNOSIS — Z23 Encounter for immunization: Secondary | ICD-10-CM | POA: Diagnosis not present

## 2019-05-16 DIAGNOSIS — F419 Anxiety disorder, unspecified: Secondary | ICD-10-CM | POA: Diagnosis not present

## 2019-05-16 LAB — ALBUMIN: Albumin: 3.9 g/dL (ref 3.5–5.0)

## 2019-05-16 LAB — BASIC METABOLIC PANEL
Anion gap: 8 (ref 5–15)
BUN: 19 mg/dL (ref 8–23)
CO2: 25 mmol/L (ref 22–32)
Calcium: 8.8 mg/dL — ABNORMAL LOW (ref 8.9–10.3)
Chloride: 103 mmol/L (ref 98–111)
Creatinine, Ser: 0.89 mg/dL (ref 0.61–1.24)
GFR calc Af Amer: >60 mL/min (ref >60)
GFR calc non Af Amer: >60 mL/min (ref >60)
Glucose, Bld: 131 mg/dL — ABNORMAL HIGH (ref 70–99)
Potassium: 4.7 mmol/L (ref 3.5–5.1)
Sodium: 136 mmol/L (ref 135–145)

## 2019-05-16 MED ORDER — DENOSUMAB 120 MG/1.7ML ~~LOC~~ SOLN
120.0000 mg | Freq: Once | SUBCUTANEOUS | Status: AC
  Administered 2019-05-16: 120 mg via SUBCUTANEOUS

## 2019-05-16 MED ORDER — INFLUENZA VAC A&B SA ADJ QUAD 0.5 ML IM PRSY
0.5000 mL | PREFILLED_SYRINGE | Freq: Once | INTRAMUSCULAR | Status: AC
  Administered 2019-05-16: 0.5 mL via INTRAMUSCULAR

## 2019-05-17 ENCOUNTER — Inpatient Hospital Stay (HOSPITAL_BASED_OUTPATIENT_CLINIC_OR_DEPARTMENT_OTHER): Payer: Medicare Other | Admitting: Hospice and Palliative Medicine

## 2019-05-17 DIAGNOSIS — Z515 Encounter for palliative care: Secondary | ICD-10-CM

## 2019-05-18 ENCOUNTER — Other Ambulatory Visit: Payer: Self-pay | Admitting: *Deleted

## 2019-05-18 MED ORDER — FENTANYL 75 MCG/HR TD PT72: 1.0000 | MEDICATED_PATCH | TRANSDERMAL | 0 refills | Status: DC

## 2019-05-18 MED ORDER — OXYCODONE HCL 20 MG PO TABS: 0.5000 | ORAL_TABLET | ORAL | 0 refills | Status: DC | PRN

## 2019-05-18 NOTE — Progress Notes (Signed)
Patient was scheduled for telephone visit today. I tried calling several times but was unable to reach him. Will reschedule visit.   PDMP reviewed.

## 2019-05-20 ENCOUNTER — Telehealth: Payer: Self-pay

## 2019-05-20 NOTE — Telephone Encounter (Signed)
Telephone call to [facility] NP at [facility].  Josh was scheduled to have visit with patient this week.  Home Palliative care team has left multiple messages for patient to return call to schedule palliative care visit.  Patient has not returned calls.  Josh states he was scheduled to have telephonic visit with patient this week.  Merrily Pew states he called patient multiple times but patient did not answer his calls.  Merrily Pew states he will attempt to reschedule with patient.  Palliative Care team will continue to contact patient.

## 2019-05-24 ENCOUNTER — Other Ambulatory Visit: Payer: Self-pay

## 2019-05-24 ENCOUNTER — Telehealth: Payer: Self-pay

## 2019-05-24 NOTE — Telephone Encounter (Signed)
Telephone call to patient to schedule palliative care visit.  Patient has not returned calls to palliative care team.

## 2019-05-25 ENCOUNTER — Inpatient Hospital Stay: Payer: Medicare Other | Attending: Hematology and Oncology

## 2019-05-25 VITALS — BP 123/78 | HR 87

## 2019-05-25 DIAGNOSIS — G893 Neoplasm related pain (acute) (chronic): Secondary | ICD-10-CM | POA: Insufficient documentation

## 2019-05-25 DIAGNOSIS — C7951 Secondary malignant neoplasm of bone: Secondary | ICD-10-CM | POA: Diagnosis not present

## 2019-05-25 DIAGNOSIS — Z923 Personal history of irradiation: Secondary | ICD-10-CM | POA: Insufficient documentation

## 2019-05-25 DIAGNOSIS — Z79899 Other long term (current) drug therapy: Secondary | ICD-10-CM | POA: Diagnosis not present

## 2019-05-25 DIAGNOSIS — F419 Anxiety disorder, unspecified: Secondary | ICD-10-CM | POA: Diagnosis not present

## 2019-05-25 DIAGNOSIS — C7801 Secondary malignant neoplasm of right lung: Secondary | ICD-10-CM | POA: Diagnosis not present

## 2019-05-25 DIAGNOSIS — C3431 Malignant neoplasm of lower lobe, right bronchus or lung: Secondary | ICD-10-CM | POA: Diagnosis not present

## 2019-05-25 DIAGNOSIS — Z5111 Encounter for antineoplastic chemotherapy: Secondary | ICD-10-CM | POA: Insufficient documentation

## 2019-05-25 MED ORDER — CYANOCOBALAMIN 1000 MCG/ML IJ SOLN
1000.0000 ug | Freq: Once | INTRAMUSCULAR | Status: AC
  Administered 2019-05-25: 1000 ug via INTRAMUSCULAR

## 2019-05-26 ENCOUNTER — Other Ambulatory Visit: Payer: Self-pay | Admitting: *Deleted

## 2019-05-26 DIAGNOSIS — F419 Anxiety disorder, unspecified: Secondary | ICD-10-CM

## 2019-05-26 MED ORDER — ALPRAZOLAM 0.5 MG PO TABS: 0.5000 mg | ORAL_TABLET | Freq: Three times a day (TID) | ORAL | 0 refills | Status: DC | PRN

## 2019-05-26 NOTE — Progress Notes (Signed)
[facility]  [address], [address] [place], [place] [address] Phone: [phone]  Fax: [phone]   Clinic Day:  05/30/2019  Referring physician: [name], NP  Chief Complaint: Jimmy West is a 68 y.o. male with metastatic adenocarcinoma of the lung who is seen for assessment prior to cycle #5 Alimta and pembrolizumab.   HPI: The patient was last seen in the medical oncology clinic on 05/09/2019. At that time, he was doing well. He had recovered from his last cycle. He had gained weight. Exam was stable. Creatinine was stable at 0.88.  He received Alimta and pembrolizumab.   He continued Fentanyl 75 mcg/hr patch and oxycodone 10 mg p.o. every 4 hours as needed.   Labs on 05/16/2019 showed albumin 3.9 and calcium 8.8.  He received Xgeva and influenza vaccine.   He received B-12 injection on 05/25/2019 secondary to his Alimta.   During the interim, he has felt good. Patient reports feeling fatigued after treatment for 3-4 days.  He was able to eat and drink after treatments. Patient noted shortness of breath 2-3 days ago. He described transient pleuritic-like left sided chest pain.  He also had some right sided chest discomfort.  Today, he denies any pain or shortness of breath. He has a slight cough.  He denies leg swelling.  He has taken his steroid premedications and folic acid.    Past Medical History:  Diagnosis Date   Bulging lumbar disc    Cancer ([place])    stage 4 lung cancer   Heart attack [facility])     Past Surgical History:  Procedure Laterality Date   CARDIAC CATHETERIZATION     two stents   KNEE SURGERY Left    PORTA CATH INSERTION N/A 05/21/2018   Procedure: PORTA CATH INSERTION;  Surgeon: Algernon Huxley, MD;  Location: [facility];  Service: Cardiovascular;  Laterality: N/A;    Family History  Problem Relation Age of Onset   Heart failure Father    Cancer Maternal Aunt    Heart failure Maternal Uncle     Dementia Paternal Grandmother    Cancer Maternal Aunt     Social History:  reports that he quit smoking about 15 years ago. His smoking use included cigarettes. He has a 22.50 pack-year smoking history. He has never used smokeless tobacco. He reports current alcohol use of about 15.0 standard drinks of alcohol per week. He reports that he does not use drugs. He started smoking at age 21. He is smoking 1 1/2 packs/day. Patient denies known exposures to radiation ortoxins. 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 He has not been working recently as the salon was closed.  He plays golf occasionally.He has 8 cats. The patient is alone today.  Allergies: No Known Allergies  Current Medications: Current Outpatient Medications  Medication Sig Dispense Refill   acetaminophen (TYLENOL) 500 MG tablet Take 500 mg by mouth 3 (three) times daily as needed.     ALPRAZolam (XANAX) 0.5 MG tablet Take 1 tablet (0.5 mg total) by mouth 3 (three) times daily as needed for anxiety. 60 tablet 0   aspirin EC 81 MG tablet Take 81 mg by mouth daily.     Calcium 600-400 MG-UNIT CHEW Chew 2 tablets by mouth daily.     dexamethasone (DECADRON) 4 MG tablet Take 1 tab two times a day the day before Alimta chemo, then take 2 tabs once a day for 3 days  starting the day after chemo. 30 tablet 1   fentaNYL (DURAGESIC) 75 MCG/HR Place 1 patch onto the skin every 3 (three) days. 10 patch 0   FLUoxetine (PROZAC) 40 MG capsule Take 1 capsule (40 mg total) by mouth daily. 30 capsule 3   folic acid (FOLVITE) 1 MG tablet Take 1 tablet (1 mg total) by mouth daily. Start 5-7 days before Alimta chemotherapy. Continue until 21 days after Alimta completed. 100 tablet 3   ibuprofen (ADVIL,MOTRIN) 200 MG tablet Take 200 mg by mouth every 6 (six) hours as needed.     ondansetron (ZOFRAN ODT) 4 MG disintegrating tablet Take 1 tablet (4 mg total) by mouth every 6 (six) hours as needed for nausea or  vomiting. 30 tablet 1   Oxycodone HCl 20 MG TABS Take 0.5-1 tablets (10-20 mg total) by mouth every 4 (four) hours as needed (for severe pain). 90 tablet 0   polyethylene glycol (MIRALAX / GLYCOLAX) packet Take 17 g by mouth daily.     senna (SENOKOT) 8.6 MG tablet Take 1 tablet by mouth as needed.      No current facility-administered medications for this visit.    Facility-Administered Medications Ordered in Other Visits  Medication Dose Route Frequency Provider Last Rate Last Dose   0.9 %  sodium chloride infusion   Intravenous Once Meili Kleckley C, MD       0.9 %  sodium chloride infusion   Intravenous Once PRN Adelynne Joerger C, MD       albuterol (PROVENTIL) (2.5 MG/3ML) 0.083% nebulizer solution 2.5 mg  2.5 mg Nebulization Once PRN Puanani Gene C, MD       alteplase (CATHFLO ACTIVASE) injection 2 mg  2 mg Intracatheter Once PRN Lequita Asal, MD       EPINEPHrine (ADRENALIN) 1 MG/10ML injection 0.25 mg  0.25 mg Intravenous Once PRN Teiara Baria C, MD       EPINEPHrine (ADRENALIN) 1 MG/10ML injection 0.25 mg  0.25 mg Intravenous Once PRN Deval Mroczka C, MD       heparin lock flush 100 unit/mL  500 Units Intracatheter Once PRN Lequita Asal, MD       heparin lock flush 100 unit/mL  250 Units Intracatheter Once PRN Lequita Asal, MD       sodium chloride flush (NS) 0.9 % injection 10 mL  10 mL Intravenous PRN Nolon Stalls C, MD   10 mL at 03/04/19 0959   sodium chloride flush (NS) 0.9 % injection 10 mL  10 mL Intracatheter Once PRN Nolon Stalls C, MD       sodium chloride flush (NS) 0.9 % injection 3 mL  3 mL Intracatheter Once PRN Lequita Asal, MD        Review of Systems  Constitutional: Positive for malaise/fatigue (x 3-4 days). Negative for chills, diaphoresis, fever and weight loss (up 4 pounds).       Feels "pretty good".  HENT: Negative.  Negative for congestion, ear pain, nosebleeds, sinus pain and sore throat.    Eyes: Negative.  Negative for blurred vision and double vision.  Respiratory: Positive for cough (slight) and shortness of breath (2-3 days ago). Negative for hemoptysis, sputum production and wheezing.   Cardiovascular: Positive for chest pain (2-3 days ago on left >> right side with deep breathing). Negative for palpitations, orthopnea, leg swelling and PND.  Gastrointestinal: Negative for abdominal pain, blood in stool, constipation, diarrhea, melena, nausea and vomiting.  Eating well.  Genitourinary: Negative.  Negative for dysuria, flank pain, frequency, hematuria and urgency.  Musculoskeletal: Negative.  Negative for back pain, falls, joint pain, myalgias and neck pain.  Skin: Negative.  Negative for rash.  Neurological: Negative.  Negative for dizziness, tingling, tremors, sensory change, speech change, focal weakness, weakness and headaches.  Endo/Heme/Allergies: Negative.  Negative for environmental allergies. Does not bruise/bleed easily.  Psychiatric/Behavioral: Negative.  Negative for depression and memory loss. The patient is not nervous/anxious (chronic on Xanax) and does not have insomnia.   All other systems reviewed and are negative.  Performance status (ECOG): 1  Vitals Blood pressure 109/70, pulse 79, temperature 97.9 F (36.6 C), temperature source Tympanic, resp. rate 18, height 5' 6"  (1.676 m), weight 151 lb 10.8 oz (68.8 kg), SpO2 98 %.  Physical Exam  Constitutional: He is oriented to person, place, and time. He appears well-developed and well-nourished. No distress.  HENT:  Head: Normocephalic and atraumatic.  Mouth/Throat: Oropharynx is clear and moist. No oropharyngeal exudate.  Thin graying hair.  Mustache.  Dentures.  Mask.  Eyes: Pupils are equal, round, and reactive to light. Conjunctivae and EOM are normal. No scleral icterus.  Glasses.  Gray blue eyes.  Neck: Normal range of motion. Neck supple. No JVD present.  Cardiovascular: Normal rate, regular  rhythm and normal heart sounds. Exam reveals no gallop and no friction rub.  No murmur heard. Pulmonary/Chest: Effort normal and breath sounds normal. No respiratory distress. He has no wheezes. He has no rales. He exhibits no tenderness.  Abdominal: Soft. Bowel sounds are normal. He exhibits no distension and no mass. There is no abdominal tenderness. There is no rebound and no guarding.  Musculoskeletal: Normal range of motion.        General: No tenderness or edema.  Lymphadenopathy:       Head (right side): No preauricular, no posterior auricular and no occipital adenopathy present.       Head (left side): No preauricular, no posterior auricular and no occipital adenopathy present.    He has no cervical adenopathy.    He has no axillary adenopathy.       Right: No supraclavicular adenopathy present.       Left: No supraclavicular adenopathy present.  Neurological: He is alert and oriented to person, place, and time.  Skin: Skin is warm and dry. No rash noted. He is not diaphoretic. No erythema. No pallor.  Psychiatric: He has a normal mood and affect. His behavior is normal. Judgment and thought content normal.  Nursing note and vitals reviewed.   Infusion on 05/30/2019  Component Date Value Ref Range Status   Sodium 05/30/2019 136  135 - 145 mmol/L Final   Potassium 05/30/2019 4.6  3.5 - 5.1 mmol/L Final   Chloride 05/30/2019 105  98 - 111 mmol/L Final   CO2 05/30/2019 23  22 - 32 mmol/L Final   Glucose, Bld 05/30/2019 162* 70 - 99 mg/dL Final   BUN 05/30/2019 15  8 - 23 mg/dL Final   Creatinine, Ser 05/30/2019 0.93  0.61 - 1.24 mg/dL Final   Calcium 05/30/2019 9.1  8.9 - 10.3 mg/dL Final   Total Protein 05/30/2019 6.7  6.5 - 8.1 g/dL Final   Albumin 05/30/2019 3.6  3.5 - 5.0 g/dL Final   AST 05/30/2019 20  15 - 41 U/L Final   ALT 05/30/2019 15  0 - 44 U/L Final   Alkaline Phosphatase 05/30/2019 79  38 - 126 U/L Final  Total Bilirubin 05/30/2019 0.5  0.3 - 1.2  mg/dL Final   GFR calc non Af Amer 05/30/2019 >60  >60 mL/min Final   GFR calc Af Amer 05/30/2019 >60  >60 mL/min Final   Anion gap 05/30/2019 8  5 - 15 Final   Performed at [facility], [address], [place], [place] [address]   WBC 05/30/2019 8.8  4.0 - 10.5 K/uL Final   RBC 05/30/2019 3.05* 4.22 - 5.81 MIL/uL Final   Hemoglobin 05/30/2019 9.0* 13.0 - 17.0 g/dL Final   HCT 05/30/2019 28.2* 39.0 - 52.0 % Final   MCV 05/30/2019 92.5  80.0 - 100.0 fL Final   MCH 05/30/2019 29.5  26.0 - 34.0 pg Final   MCHC 05/30/2019 31.9  30.0 - 36.0 g/dL Final   RDW 05/30/2019 17.0* 11.5 - 15.5 % Final   Platelets 05/30/2019 318  150 - 400 K/uL Final   nRBC 05/30/2019 0.0  0.0 - 0.2 % Final   Neutrophils Relative % 05/30/2019 91  % Final   Neutro Abs 05/30/2019 8.0* 1.7 - 7.7 K/uL Final   Lymphocytes Relative 05/30/2019 4  % Final   Lymphs Abs 05/30/2019 0.4* 0.7 - 4.0 K/uL Final   Monocytes Relative 05/30/2019 4  % Final   Monocytes Absolute 05/30/2019 0.3  0.1 - 1.0 K/uL Final   Eosinophils Relative 05/30/2019 0  % Final   Eosinophils Absolute 05/30/2019 0.0  0.0 - 0.5 K/uL Final   Basophils Relative 05/30/2019 0  % Final   Basophils Absolute 05/30/2019 0.0  0.0 - 0.1 K/uL Final   Immature Granulocytes 05/30/2019 1  % Final   Abs Immature Granulocytes 05/30/2019 0.11* 0.00 - 0.07 K/uL Final   Performed at [facility], [address], [place], [place] [address]   Magnesium 05/30/2019 2.0  1.7 - 2.4 mg/dL Final   Performed at [facility], [address], [place], [place] [address]    Assessment:  Gwendolyn Nishi is a 68 y.o. male with metastatichigh-grade adenocarcinomaof the right lungs/p CT-guided biopsy of a RLL lung mass on 05/11/2018. Pathologyrevealed an invasive high-grade adenocarcinoma, with predominantly solid growth pattern. The neoplastic cells were TTF-1 (+), Napsin A (+), and P40 (+). He has a T4 vertebral  metastasis. Clinical stage is T4N1M1.  There was not enough material for [facility] One testing. PDL-1revealed TPS 90%.  PET scanon 04/30/2018 revealed a 3.4 cm hypermetabolic RLL pulmonary mass (SUV 13.2), 11 mm pulmonary nodule in the LEFT lung (SUV 4.5), RIGHT paratracheal lymph node (SUV 5.1), and hypermetabolic activity within the T4 vertebral body (SUV 10.2).   Thoracic spine MRIon 05/03/2018 revealed T4 metastasiswith a 40% pathologic compression deformity and 5 mm of retropulsion of the vertebral body. Retropulsion results in mild spinal canal stenosis and mild bilateral C4-5 foraminal stenosis. There was paravertebral soft tissue thickening from mid T3 to mid T5, likely representing edema related to the pathologic compression deformity vs. possible extraosseous extension of the neoplasm. There were no additional thoracic spinal metastases noted.   Head MRIon 05/03/2018 revealed no intracranial metastatic disease. 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 Incidental mention made of mild paranasal sinus disease.  Hereceived 11cycles ofpembrolizumab(05/24/2018 - 02/07/2019). He toleratedtreatment well. CEAwas 1.3 on 08/30/2018.LDHwas 165 on 11/29/2018.  He completed T4 radiationon 07/07/2018. He receives Xgevamonthly (06/03/2018 - 05/16/2019).  He  received 1 cycle of carboplatin, Alimta, and pembrolizumabon 02/08/2019. Cycle #1 was complicated by nausea, vomiting, and diarrhea necessitating hospitalization.Decision made to hold carboplatin with his second dose.He iss/p2 cycles ofAlimta and pembrolizumab (02/28/2019 - 03/29/2019).Cycle #2 was complicated by nausea, vomiting, and dehydration requring fluids in clinic and an overnight stay in the hospital.  He received carboplatin (AUC  2), Alimta, and pebrolizumab (04/18/2019).  He tolerated it poorly.  He received Alimta and pembrolizumab on 05/09/2019.  He tolerated well.  He receives B12 every 9 weeks (last 05/25/2019).  He hascancer-related painin T4. He is currently taking Fentanyl75 mcg/hr and oxycodone10 mg every 4 hours prn.  Cervical and thoracic spine CT at [facility]/10/2019 revealed unchanged severe compression deformity/vertebral plana of T4 with a stable degree of retropulsion and focal mild spinal canal stenosis. There was interval enlargement of a right lower lobe pulmonary mass(4.5 x 3.5 cm compared to 3.9 x 2.9 cm)with redemonstrated spiculated pulmonary nodules.  Chest, abdomen, pelvisCTon 01/28/2019 revealed slight interval enlargement of a right lower lobe mass with central necrosis measuring 4.6 x 3.4 cm, previously 4.0 x 3.0 cm. There was no change in right upper lobe nodules(1.4 and 0.8 cm).Therewas almost no residua of left lung nodules, with irregular opacities in the apical left upper lobeandsuperior segment left lower lobe. There was no change in right hilar soft tissue and lymph nodes.There wasvertebra plana deformity of T4andno evidence of new osseous metastatic disease. No evidence of distant metastatic disease in the abdomen or pelvis.The1.6 x 1.1 cmleft adrenal nodule(non-metabolic on prior PET scan),was unchanged.  Abdomen and pelvis CT on 04/23/2019 revealed no acute abdominal or pelvic pathology.  There was fluid in the colon which can be seen with diarrhea.  There was diverticulosis without evidence of diverticulitis.  There was a stable 1.5 cm left adrenal nodule.  Chest, abdomen, and pelvis CT on 05/06/2019 revealed a positive response to interval therapy for the known right lower lobe lung carcinoma (4.6 x 3.4 cm to 3.1 x 2.8 cm transversely).  There had been a mild interval decrease contiguous soft tissue that extends along the right hilum. The two right upper lobe  nodules were stable from the most recent prior study (smaller than 04/27/2018). There are no new lung nodules. There was no evidence of new metastatic disease.  There was stable severe compression deformity/vertebra plana of T4. There was no evidence of other osseous metastatic disease.  There was a stable left adrenal nodule consistent with an adenoma.  Hehad transient renal insufficiencyon 03/23/2019. Creatinine was 1.32 (baseline 0.61-1.0). Renal ultrasoundon 03/24/2019 revealed no acute abnormality identified.There was no hydronephrosis or bladder distention.Creatinine is 0.82 today.  He received the influenza vaccine on 05/16/2019.  Symptomatically, he feels "pretty good".  He describes some transient L >> R lateral chest discomfort.  There appeared to have been a pleuritic component to the left side.  Exam is unremarkable.  Plan: 1.   Labs today: CBC with diff, CMP, Mg, TSH, free T4. 2. Metastatic high-grade adenocarcinoma the RIGHT lung He is s/p 11 cycles of pembrolizumab. He is s/p 1 cycle of carboplatin, Almita, and pembrolizumab (02/08/2019). He is s/p 1 cycle of carboplatin (AUC 2), Alimta, and pembrolizumab (04/18/2019). He is s/p 3 cycles of Alimta and pembrolizumab  (02/28/2019 - 03/29/2019; 05/09/2019).  He is tolerating treatment without carboplatin (full or reduced dose) better.  Patient notes last treatment was slightly more difficult. Labs reviewed.  Begin cycle #4 Alimta and pembrolizumab alone. Discuss symptom management.  He has antiemetics  and pain medications at home to use on a prn bases.  Interventions are adequate.       3.   Nausea and vomiting post chemotherapy             He struggled with chemotherapy including carboplatin.  He still requires daily assessment +/- IVF for the first week after treatment.              RTC daily this week for +/- IVF and +/- anti-emetics. 4. Bone metastasis             Consult [facility] (Dr Izora Ribas from [facility])  at [facility] re: T4 pathologic fracture.             Last Xgeva on 05/16/2019.             RTC on 06/13/2019 for labs (BMP, albumen) and Xegva.  5. Cancer related pain Pain remains well controlled. Continue Fentanyl 75 mcg patch and oxycodone 10 mg p.o. every 4 hours as needed pain. 6. Anxiety Continue low dose Ativan. 7.   Transient shortness of breath and left sided pleuritic chest pain  Patient at risk for pulmonary embolism.  No intervening lower extremity edema.  Clinically well appearing.  Oxygen saturation is normal.  Check D-dimer.  If elevated, chest CT angiogram to r/o pulmonary embolism. 8.   Normocytic anemia  Etiology felt secondary to chemotherapy induced anemia.  Check ferritin and iron stores.  Preauth Retacrit. 9.   RTC daily this week for +/- IVF and +/- anti-emetics. 10.   RTC on 06/13/2019 for labs (BMP, albumen) and Xgeva. 11.   RTC on 06/20/2019 for MD assessment, labs (CBC with diff, CMP, Mg, TSH, free T4), and cycle #5 Alimta and pembrolizumab.  Addendum:  D-dimer was 2887.13.  Chest CT angiogram STAT today.  I discussed the assessment and treatment plan with the patient.  The patient was provided an opportunity to ask questions and all were answered.  The patient agreed with the plan and demonstrated an understanding of the instructions.  The patient was advised to call back if the symptoms worsen or if the condition fails to improve as anticipated.   Lequita Asal, MD, PhD    05/30/2019, 10:12 AM  I, Selena Batten, am acting as scribe for [employer]. Mike Gip, MD, PhD.  I, Willow Reczek C. Mike Gip, MD, have reviewed the above documentation for accuracy and completeness, and I agree with the above.

## 2019-05-30 ENCOUNTER — Inpatient Hospital Stay: Payer: Medicare Other

## 2019-05-30 ENCOUNTER — Telehealth: Payer: Self-pay

## 2019-05-30 ENCOUNTER — Encounter: Payer: Self-pay | Admitting: Hematology and Oncology

## 2019-05-30 ENCOUNTER — Inpatient Hospital Stay (HOSPITAL_BASED_OUTPATIENT_CLINIC_OR_DEPARTMENT_OTHER): Payer: Medicare Other | Admitting: Hematology and Oncology

## 2019-05-30 ENCOUNTER — Ambulatory Visit
Admission: RE | Admit: 2019-05-30 | Discharge: 2019-05-30 | Disposition: A | Discharge status: Home / Self Care | Payer: Medicare Other | Source: Ambulatory Visit | Attending: Hematology and Oncology | Admitting: Hematology and Oncology

## 2019-05-30 ENCOUNTER — Other Ambulatory Visit: Payer: Self-pay | Admitting: Hematology and Oncology

## 2019-05-30 ENCOUNTER — Other Ambulatory Visit: Payer: Self-pay

## 2019-05-30 VITALS — BP 109/70 | HR 79 | Temp 97.9°F | Resp 18 | Ht 66.0 in | Wt 151.7 lb

## 2019-05-30 VITALS — BP 109/70 | HR 79 | Temp 97.9°F | Resp 18

## 2019-05-30 DIAGNOSIS — C7801 Secondary malignant neoplasm of right lung: Secondary | ICD-10-CM | POA: Diagnosis not present

## 2019-05-30 DIAGNOSIS — G893 Neoplasm related pain (acute) (chronic): Secondary | ICD-10-CM

## 2019-05-30 DIAGNOSIS — C7951 Secondary malignant neoplasm of bone: Secondary | ICD-10-CM | POA: Diagnosis not present

## 2019-05-30 DIAGNOSIS — R0600 Dyspnea, unspecified: Secondary | ICD-10-CM

## 2019-05-30 DIAGNOSIS — D649 Anemia, unspecified: Secondary | ICD-10-CM | POA: Diagnosis not present

## 2019-05-30 DIAGNOSIS — C3431 Malignant neoplasm of lower lobe, right bronchus or lung: Secondary | ICD-10-CM

## 2019-05-30 DIAGNOSIS — R0602 Shortness of breath: Secondary | ICD-10-CM

## 2019-05-30 DIAGNOSIS — F419 Anxiety disorder, unspecified: Secondary | ICD-10-CM

## 2019-05-30 DIAGNOSIS — Z5112 Encounter for antineoplastic immunotherapy: Secondary | ICD-10-CM | POA: Diagnosis not present

## 2019-05-30 DIAGNOSIS — I2699 Other pulmonary embolism without acute cor pulmonale: Secondary | ICD-10-CM

## 2019-05-30 DIAGNOSIS — Z5111 Encounter for antineoplastic chemotherapy: Secondary | ICD-10-CM | POA: Diagnosis not present

## 2019-05-30 LAB — COMPREHENSIVE METABOLIC PANEL
ALT: 15 U/L (ref 0–44)
AST: 20 U/L (ref 15–41)
Albumin: 3.6 g/dL (ref 3.5–5.0)
Alkaline Phosphatase: 79 U/L (ref 38–126)
Anion gap: 8 (ref 5–15)
BUN: 15 mg/dL (ref 8–23)
CO2: 23 mmol/L (ref 22–32)
Calcium: 9.1 mg/dL (ref 8.9–10.3)
Chloride: 105 mmol/L (ref 98–111)
Creatinine, Ser: 0.93 mg/dL (ref 0.61–1.24)
GFR calc Af Amer: >60 mL/min (ref >60)
GFR calc non Af Amer: >60 mL/min (ref >60)
Glucose, Bld: 162 mg/dL — ABNORMAL HIGH (ref 70–99)
Potassium: 4.6 mmol/L (ref 3.5–5.1)
Sodium: 136 mmol/L (ref 135–145)
Total Bilirubin: 0.5 mg/dL (ref 0.3–1.2)
Total Protein: 6.7 g/dL (ref 6.5–8.1)

## 2019-05-30 LAB — CBC WITH DIFFERENTIAL/PLATELET
Abs Immature Granulocytes: 0.11 10*3/uL — ABNORMAL HIGH (ref 0.00–0.07)
Basophils Absolute: 0 10*3/uL (ref 0.0–0.1)
Basophils Relative: 0 %
Eosinophils Absolute: 0 10*3/uL (ref 0.0–0.5)
Eosinophils Relative: 0 %
HCT: 28.2 % — ABNORMAL LOW (ref 39.0–52.0)
Hemoglobin: 9 g/dL — ABNORMAL LOW (ref 13.0–17.0)
Immature Granulocytes: 1 %
Lymphocytes Relative: 4 %
Lymphs Abs: 0.4 10*3/uL — ABNORMAL LOW (ref 0.7–4.0)
MCH: 29.5 pg (ref 26.0–34.0)
MCHC: 31.9 g/dL (ref 30.0–36.0)
MCV: 92.5 fL (ref 80.0–100.0)
Monocytes Absolute: 0.3 10*3/uL (ref 0.1–1.0)
Monocytes Relative: 4 %
Neutro Abs: 8 10*3/uL — ABNORMAL HIGH (ref 1.7–7.7)
Neutrophils Relative %: 91 %
Platelets: 318 10*3/uL (ref 150–400)
RBC: 3.05 MIL/uL — ABNORMAL LOW (ref 4.22–5.81)
RDW: 17 % — ABNORMAL HIGH (ref 11.5–15.5)
WBC: 8.8 10*3/uL (ref 4.0–10.5)
nRBC: 0 % (ref 0.0–0.2)

## 2019-05-30 LAB — IRON AND TIBC
Iron: 86 ug/dL (ref 45–182)
Saturation Ratios: 26 % (ref 17.9–39.5)
TIBC: 335 ug/dL (ref 250–450)
UIBC: 249 ug/dL

## 2019-05-30 LAB — FIBRIN DERIVATIVES D-DIMER (ARMC ONLY): Fibrin derivatives D-dimer (ARMC): 2887.13 ng/mL (FEU) — ABNORMAL HIGH (ref 0.00–499.00)

## 2019-05-30 LAB — FERRITIN: Ferritin: 687 ng/mL — ABNORMAL HIGH (ref 24–336)

## 2019-05-30 LAB — TSH: TSH: 0.368 u[IU]/mL (ref 0.350–4.500)

## 2019-05-30 LAB — MAGNESIUM: Magnesium: 2 mg/dL (ref 1.7–2.4)

## 2019-05-30 MED ORDER — IOHEXOL 350 MG/ML SOLN
75.0000 mL | Freq: Once | INTRAVENOUS | Status: AC | PRN
  Administered 2019-05-30: 75 mL via INTRAVENOUS

## 2019-05-30 MED ORDER — APIXABAN 5 MG PO TABS: ORAL_TABLET | ORAL | 0 refills | Status: DC

## 2019-05-30 MED ORDER — SODIUM CHLORIDE 0.9 % IV SOLN
200.0000 mg | Freq: Once | INTRAVENOUS | Status: AC
  Administered 2019-05-30: 200 mg via INTRAVENOUS
  Filled 2019-05-30: qty 8

## 2019-05-30 MED ORDER — HEPARIN SOD (PORK) LOCK FLUSH 100 UNIT/ML IV SOLN
500.0000 [IU] | Freq: Once | INTRAVENOUS | Status: AC | PRN
  Administered 2019-05-30: 500 [IU]
  Filled 2019-05-30: qty 5

## 2019-05-30 MED ORDER — PALONOSETRON HCL INJECTION 0.25 MG/5ML
0.2500 mg | Freq: Once | INTRAVENOUS | Status: AC
  Administered 2019-05-30: 0.25 mg via INTRAVENOUS
  Filled 2019-05-30: qty 5

## 2019-05-30 MED ORDER — SODIUM CHLORIDE 0.9 % IV SOLN
Freq: Once | INTRAVENOUS | Status: AC
  Administered 2019-05-30: 11:00:00 via INTRAVENOUS
  Filled 2019-05-30: qty 250

## 2019-05-30 MED ORDER — SODIUM CHLORIDE 0.9 % IV SOLN
500.0000 mg/m2 | Freq: Once | INTRAVENOUS | Status: AC
  Administered 2019-05-30: 900 mg via INTRAVENOUS
  Filled 2019-05-30: qty 20

## 2019-05-30 NOTE — Telephone Encounter (Signed)
spoke with Jimmy West to inform him that Dr Mike Gip need him to come back to the facility today at this moment. The patient states he is on the way back now.  The patient was understanding and agreeable to come back now.

## 2019-05-30 NOTE — Progress Notes (Signed)
Patient states that he had pain with taking a deep and "a little winded" for a couple days but it got better (last Friday/saturday). Patient states its "nothing serious, just enough to notice it"

## 2019-05-30 NOTE — Progress Notes (Signed)
[facility]  [address], [address] [place], [place] [address] Phone: [phone]  Fax: [phone]   Clinic Day:  05/30/2019  Referring physician: [name], NP  Chief Complaint: Jimmy West is a 68 y.o. male with metastatic adenocarcinoma of the lung who is seen for assessment prior to cycle #5 Alimta and pembrolizumab.   HPI: The patient was last seen in the medical oncology clinic on 05/09/2019. At that time, he was doing well. He had recovered from his last cycle. He had gained weight. Exam was stable. Creatinine was stable at 0.88.  He received Alimta and pembrolizumab.   He continued Fentanyl 75 mcg/hr patch and oxycodone 10 mg p.o. every 4 hours as needed.   Labs on 05/16/2019 showed albumin 3.9 and calcium 8.8.  He received Xgeva and influenza vaccine.   He received B-12 injection on 05/25/2019 secondary to his Alimta.   During the interim, he has felt good. Patient reports feeling fatigued after treatment for 3-4 days.  He was able to eat and drink after treatments. Patient noted shortness of breath 2-3 days ago. He described transient pleuritic-like left sided chest pain.  He also had some right sided chest discomfort.  Today, he denies any pain or shortness of breath. He has a slight cough.  He denies leg swelling.  He has taken his steroid premedications and folic acid.    Past Medical History:  Diagnosis Date   Bulging lumbar disc    Cancer ([place])    stage 4 lung cancer   Heart attack [facility])     Past Surgical History:  Procedure Laterality Date   CARDIAC CATHETERIZATION     two stents   KNEE SURGERY Left    PORTA CATH INSERTION N/A 05/21/2018   Procedure: PORTA CATH INSERTION;  Surgeon: Algernon Huxley, MD;  Location: [facility];  Service: Cardiovascular;  Laterality: N/A;    Family History  Problem Relation Age of Onset   Heart failure Father    Cancer Maternal Aunt    Heart failure Maternal Uncle     Dementia Paternal Grandmother    Cancer Maternal Aunt     Social History:  reports that he quit smoking about 15 years ago. His smoking use included cigarettes. He has a 22.50 pack-year smoking history. He has never used smokeless tobacco. He reports current alcohol use of about 15.0 standard drinks of alcohol per week. He reports that he does not use drugs. He started smoking at age 21. He is smoking 1 1/2 packs/day. Patient denies known exposures to radiation ortoxins. 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 He has not been working recently as the salon was closed.  He plays golf occasionally.He has 8 cats. The patient is alone today.  Allergies: No Known Allergies  Current Medications: Current Outpatient Medications  Medication Sig Dispense Refill   acetaminophen (TYLENOL) 500 MG tablet Take 500 mg by mouth 3 (three) times daily as needed.     ALPRAZolam (XANAX) 0.5 MG tablet Take 1 tablet (0.5 mg total) by mouth 3 (three) times daily as needed for anxiety. 60 tablet 0   aspirin EC 81 MG tablet Take 81 mg by mouth daily.     Calcium 600-400 MG-UNIT CHEW Chew 2 tablets by mouth daily.     dexamethasone (DECADRON) 4 MG tablet Take 1 tab two times a day the day before Alimta chemo, then take 2 tabs once a day for 3 days  starting the day after chemo. 30 tablet 1   fentaNYL (DURAGESIC) 75 MCG/HR Place 1 patch onto the skin every 3 (three) days. 10 patch 0   FLUoxetine (PROZAC) 40 MG capsule Take 1 capsule (40 mg total) by mouth daily. 30 capsule 3   folic acid (FOLVITE) 1 MG tablet Take 1 tablet (1 mg total) by mouth daily. Start 5-7 days before Alimta chemotherapy. Continue until 21 days after Alimta completed. 100 tablet 3   ibuprofen (ADVIL,MOTRIN) 200 MG tablet Take 200 mg by mouth every 6 (six) hours as needed.     ondansetron (ZOFRAN ODT) 4 MG disintegrating tablet Take 1 tablet (4 mg total) by mouth every 6 (six) hours as needed for nausea or  vomiting. 30 tablet 1   Oxycodone HCl 20 MG TABS Take 0.5-1 tablets (10-20 mg total) by mouth every 4 (four) hours as needed (for severe pain). 90 tablet 0   polyethylene glycol (MIRALAX / GLYCOLAX) packet Take 17 g by mouth daily.     senna (SENOKOT) 8.6 MG tablet Take 1 tablet by mouth as needed.      No current facility-administered medications for this visit.    Facility-Administered Medications Ordered in Other Visits  Medication Dose Route Frequency Provider Last Rate Last Dose   0.9 %  sodium chloride infusion   Intravenous Once Corcoran, Melissa C, MD       0.9 %  sodium chloride infusion   Intravenous Once PRN Corcoran, Melissa C, MD       albuterol (PROVENTIL) (2.5 MG/3ML) 0.083% nebulizer solution 2.5 mg  2.5 mg Nebulization Once PRN Corcoran, Melissa C, MD       alteplase (CATHFLO ACTIVASE) injection 2 mg  2 mg Intracatheter Once PRN Lequita Asal, MD       EPINEPHrine (ADRENALIN) 1 MG/10ML injection 0.25 mg  0.25 mg Intravenous Once PRN Corcoran, Melissa C, MD       EPINEPHrine (ADRENALIN) 1 MG/10ML injection 0.25 mg  0.25 mg Intravenous Once PRN Corcoran, Melissa C, MD       heparin lock flush 100 unit/mL  500 Units Intracatheter Once PRN Lequita Asal, MD       heparin lock flush 100 unit/mL  250 Units Intracatheter Once PRN Lequita Asal, MD       sodium chloride flush (NS) 0.9 % injection 10 mL  10 mL Intravenous PRN Nolon Stalls C, MD   10 mL at 03/04/19 0959   sodium chloride flush (NS) 0.9 % injection 10 mL  10 mL Intracatheter Once PRN Nolon Stalls C, MD       sodium chloride flush (NS) 0.9 % injection 3 mL  3 mL Intracatheter Once PRN Lequita Asal, MD        Review of Systems  Constitutional: Positive for malaise/fatigue (x 3-4 days). Negative for chills, diaphoresis, fever and weight loss (up 4 pounds).       Feels "pretty good".  HENT: Negative.  Negative for congestion, ear pain, nosebleeds, sinus pain and sore throat.    Eyes: Negative.  Negative for blurred vision and double vision.  Respiratory: Positive for cough (slight) and shortness of breath (2-3 days ago). Negative for hemoptysis, sputum production and wheezing.   Cardiovascular: Positive for chest pain (2-3 days ago on left >> right side with deep breathing). Negative for palpitations, orthopnea, leg swelling and PND.  Gastrointestinal: Negative for abdominal pain, blood in stool, constipation, diarrhea, melena, nausea and vomiting.  Eating well.  Genitourinary: Negative.  Negative for dysuria, flank pain, frequency, hematuria and urgency.  Musculoskeletal: Negative.  Negative for back pain, falls, joint pain, myalgias and neck pain.  Skin: Negative.  Negative for rash.  Neurological: Negative.  Negative for dizziness, tingling, tremors, sensory change, speech change, focal weakness, weakness and headaches.  Endo/Heme/Allergies: Negative.  Negative for environmental allergies. Does not bruise/bleed easily.  Psychiatric/Behavioral: Negative.  Negative for depression and memory loss. The patient is not nervous/anxious (chronic on Xanax) and does not have insomnia.   All other systems reviewed and are negative.  Performance status (ECOG): 1  Vitals Blood pressure 109/70, pulse 79, temperature 97.9 F (36.6 C), temperature source Tympanic, resp. rate 18, height 5' 6"  (1.676 m), weight 151 lb 10.8 oz (68.8 kg), SpO2 98 %.  Physical Exam  Constitutional: He is oriented to person, place, and time. He appears well-developed and well-nourished. No distress.  HENT:  Head: Normocephalic and atraumatic.  Mouth/Throat: Oropharynx is clear and moist. No oropharyngeal exudate.  Thin graying hair.  Mustache.  Dentures.  Mask.  Eyes: Pupils are equal, round, and reactive to light. Conjunctivae and EOM are normal. No scleral icterus.  Glasses.  Gray blue eyes.  Neck: Normal range of motion. Neck supple. No JVD present.  Cardiovascular: Normal rate, regular  rhythm and normal heart sounds. Exam reveals no gallop and no friction rub.  No murmur heard. Pulmonary/Chest: Effort normal and breath sounds normal. No respiratory distress. He has no wheezes. He has no rales. He exhibits no tenderness.  Abdominal: Soft. Bowel sounds are normal. He exhibits no distension and no mass. There is no abdominal tenderness. There is no rebound and no guarding.  Musculoskeletal: Normal range of motion.        General: No tenderness or edema.  Lymphadenopathy:       Head (right side): No preauricular, no posterior auricular and no occipital adenopathy present.       Head (left side): No preauricular, no posterior auricular and no occipital adenopathy present.    He has no cervical adenopathy.    He has no axillary adenopathy.       Right: No supraclavicular adenopathy present.       Left: No supraclavicular adenopathy present.  Neurological: He is alert and oriented to person, place, and time.  Skin: Skin is warm and dry. No rash noted. He is not diaphoretic. No erythema. No pallor.  Psychiatric: He has a normal mood and affect. His behavior is normal. Judgment and thought content normal.  Nursing note and vitals reviewed.   Infusion on 05/30/2019  Component Date Value Ref Range Status   TSH 05/30/2019 0.368  0.350 - 4.500 uIU/mL Final   Comment: Performed by a 3rd Generation assay with a functional sensitivity of <=0.01 uIU/mL. Performed at [facility], [place], [place] [address]    Sodium 05/30/2019 136  135 - 145 mmol/L Final   Potassium 05/30/2019 4.6  3.5 - 5.1 mmol/L Final   Chloride 05/30/2019 105  98 - 111 mmol/L Final   CO2 05/30/2019 23  22 - 32 mmol/L Final   Glucose, Bld 05/30/2019 162* 70 - 99 mg/dL Final   BUN 05/30/2019 15  8 - 23 mg/dL Final   Creatinine, Ser 05/30/2019 0.93  0.61 - 1.24 mg/dL Final   Calcium 05/30/2019 9.1  8.9 - 10.3 mg/dL Final   Total Protein 05/30/2019 6.7  6.5 - 8.1 g/dL Final    Albumin 05/30/2019 3.6  3.5 - 5.0 g/dL Final   AST 05/30/2019 20  15 - 41 U/L Final   ALT 05/30/2019 15  0 - 44 U/L Final   Alkaline Phosphatase 05/30/2019 79  38 - 126 U/L Final   Total Bilirubin 05/30/2019 0.5  0.3 - 1.2 mg/dL Final   GFR calc non Af Amer 05/30/2019 >60  >60 mL/min Final   GFR calc Af Amer 05/30/2019 >60  >60 mL/min Final   Anion gap 05/30/2019 8  5 - 15 Final   Performed at [facility], [address], [place], [place] [address]   WBC 05/30/2019 8.8  4.0 - 10.5 K/uL Final   RBC 05/30/2019 3.05* 4.22 - 5.81 MIL/uL Final   Hemoglobin 05/30/2019 9.0* 13.0 - 17.0 g/dL Final   HCT 05/30/2019 28.2* 39.0 - 52.0 % Final   MCV 05/30/2019 92.5  80.0 - 100.0 fL Final   MCH 05/30/2019 29.5  26.0 - 34.0 pg Final   MCHC 05/30/2019 31.9  30.0 - 36.0 g/dL Final   RDW 05/30/2019 17.0* 11.5 - 15.5 % Final   Platelets 05/30/2019 318  150 - 400 K/uL Final   nRBC 05/30/2019 0.0  0.0 - 0.2 % Final   Neutrophils Relative % 05/30/2019 91  % Final   Neutro Abs 05/30/2019 8.0* 1.7 - 7.7 K/uL Final   Lymphocytes Relative 05/30/2019 4  % Final   Lymphs Abs 05/30/2019 0.4* 0.7 - 4.0 K/uL Final   Monocytes Relative 05/30/2019 4  % Final   Monocytes Absolute 05/30/2019 0.3  0.1 - 1.0 K/uL Final   Eosinophils Relative 05/30/2019 0  % Final   Eosinophils Absolute 05/30/2019 0.0  0.0 - 0.5 K/uL Final   Basophils Relative 05/30/2019 0  % Final   Basophils Absolute 05/30/2019 0.0  0.0 - 0.1 K/uL Final   Immature Granulocytes 05/30/2019 1  % Final   Abs Immature Granulocytes 05/30/2019 0.11* 0.00 - 0.07 K/uL Final   Performed at [facility], [address] [place], [place] [address]   Magnesium 05/30/2019 2.0  1.7 - 2.4 mg/dL Final   Performed at [facility], [address], [place], [place] [address]   Fibrin derivatives D-dimer [facility]) 05/30/2019 7,544.92* 0.00 - 499.00 ng/mL (FEU) Final   Comment: (NOTE) <> Exclusion of  Venous Thromboembolism (VTE) - OUTPATIENT ONLY   (Emergency Department or [place])   0-499 ng/ml (FEU): With a low to intermediate pretest probability                      for VTE this test result excludes the diagnosis                      of VTE.   >499 ng/ml (FEU) : VTE not excluded; additional work up for VTE is                      required. <> Testing on Inpatients and Evaluation of Disseminated Intravascular   Coagulation (DIC) Reference Range:   0-499 ng/ml (FEU) Performed at [facility], [address] [place], [place] [address]    Iron 05/30/2019 86  45 - 182 ug/dL Final   TIBC 05/30/2019 335  250 - 450 ug/dL Final   Saturation Ratios 05/30/2019 26  17.9 - 39.5 % Final   UIBC 05/30/2019 249  ug/dL Final   Performed at [facility], [address], [place], [place] [address]  Ferritin 05/30/2019 687* 24 - 336 ng/mL Final   Performed at [facility], [address] [place], [place] [address]    Assessment:  Jimmy West is a 68 y.o. male with metastatichigh-grade adenocarcinomaof the right lungs/p CT-guided biopsy of a RLL lung mass on 05/11/2018. Pathologyrevealed an invasive high-grade adenocarcinoma, with predominantly solid growth pattern. The neoplastic cells were TTF-1 (+), Napsin A (+), and P40 (+). He has a T4 vertebral metastasis. Clinical stage is T4N1M1.  There was not enough material for [facility] One testing. PDL-1revealed TPS 90%.  PET scanon 04/30/2018 revealed a 3.4 cm hypermetabolic RLL pulmonary mass (SUV 13.2), 11 mm pulmonary nodule in the LEFT lung (SUV 4.5), RIGHT paratracheal lymph node (SUV 5.1), and hypermetabolic activity within the T4 vertebral body (SUV 10.2).   Thoracic spine MRIon 05/03/2018 revealed T4 metastasiswith a 40% pathologic compression deformity and 5 mm of retropulsion of the vertebral body. Retropulsion results in mild spinal canal stenosis and mild bilateral C4-5 foraminal  stenosis. There was paravertebral soft tissue thickening from mid T3 to mid T5, likely representing edema related to the pathologic compression deformity vs. possible extraosseous extension of the neoplasm. There were no additional thoracic spinal metastases noted.   Head MRIon 05/03/2018 revealed no intracranial metastatic disease. 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 Incidental mention made of mild paranasal sinus disease.  Hereceived 11cycles ofpembrolizumab(05/24/2018 - 02/07/2019). He toleratedtreatment well. CEAwas 1.3 on 08/30/2018.LDHwas 165 on 11/29/2018.  He completed T4 radiationon 07/07/2018. He receives Xgevamonthly (06/03/2018 - 05/16/2019).  He received 1 cycle of carboplatin, Alimta, and pembrolizumabon 02/08/2019. Cycle #1 was complicated by nausea, vomiting, and diarrhea necessitating hospitalization.Decision made to hold carboplatin with his second dose.He iss/p2 cycles ofAlimta and pembrolizumab (02/28/2019 - 03/29/2019).Cycle #2 was complicated by nausea, vomiting, and dehydration requring fluids in clinic and an overnight stay in the hospital.  He received carboplatin (AUC 2), Alimta, and pebrolizumab (04/18/2019).  He tolerated it poorly.  He received Alimta and pembrolizumab on 05/09/2019.  He tolerated well.  He receives B12 every 9 weeks (last 05/25/2019).  He hascancer-related painin T4. He is currently taking Fentanyl75 mcg/hr and oxycodone10 mg every 4 hours prn.  Cervical and thoracic spine CT at [facility] 01/10/2019 revealed unchanged severe compression deformity/vertebral plana of T4 with a stable degree of retropulsion and focal mild spinal canal stenosis. There was interval enlargement of a right lower lobe pulmonary mass(4.5 x 3.5 cm compared to 3.9 x  2.9 cm)with redemonstrated spiculated pulmonary nodules.  Chest, abdomen, pelvisCTon 01/28/2019 revealed slight interval enlargement of a right lower lobe mass with central necrosis measuring 4.6 x 3.4 cm, previously 4.0 x 3.0 cm. There was no change in right upper lobe nodules(1.4 and 0.8 cm).Therewas almost no residua of left lung nodules, with irregular opacities in the apical left upper lobeandsuperior segment left lower lobe. There was no change in right hilar soft tissue and lymph nodes.There wasvertebra plana deformity of T4andno evidence of new osseous metastatic disease. No evidence of distant metastatic disease in the abdomen or pelvis.The1.6 x 1.1 cmleft adrenal nodule(non-metabolic on prior PET scan),was unchanged.  Abdomen and pelvis CT on 04/23/2019 revealed no acute abdominal or pelvic pathology.  There was fluid in the colon which can be seen with diarrhea.  There was diverticulosis without evidence of diverticulitis.  There was a stable 1.5 cm left adrenal nodule.  Chest, abdomen, and pelvis CT on 05/06/2019 revealed a positive response to interval therapy for the known right lower lobe lung carcinoma (4.6 x 3.4 cm to 3.1 x 2.8 cm transversely).  There had been a mild interval decrease contiguous soft tissue that extends along the right hilum. The two right upper lobe nodules were stable from the most recent prior study (smaller than 04/27/2018). There are no new lung nodules. There was no evidence of new metastatic disease.  There was stable severe compression deformity/vertebra plana of T4. There was no evidence of other osseous metastatic disease.  There was a stable left adrenal nodule consistent with an adenoma.  Hehad transient renal insufficiencyon 03/23/2019. Creatinine was 1.32 (baseline 0.61-1.0). Renal ultrasoundon 03/24/2019 revealed no acute abnormality identified.There was no hydronephrosis or bladder distention.Creatinine is 0.82 today.  He  received the influenza vaccine on 05/16/2019.  Symptomatically, he feels "pretty good".  He describes some transient L >> R lateral chest discomfort.  There appeared to have been a pleuritic component to the left side.  Exam is unremarkable.  Plan: 1.   Labs today: CBC with diff, CMP, Mg, TSH, free T4. 2. Metastatic high-grade adenocarcinoma the RIGHT lung He is s/p 11 cycles of pembrolizumab. He is s/p 1 cycle of carboplatin, Almita, and pembrolizumab (02/08/2019). He is s/p 1 cycle of carboplatin (AUC 2), Alimta, and pembrolizumab (04/18/2019). He is s/p 3 cycles of Alimta and pembrolizumab  (02/28/2019 - 03/29/2019; 05/09/2019).  He is tolerating treatment without carboplatin (full or reduced dose) better.  Patient notes last treatment was slightly more difficult. Labs reviewed.  Begin cycle #4 Alimta and pembrolizumab alone. Discuss symptom management.  He has antiemetics and pain medications at home to use on a prn bases.  Interventions are adequate.       3.   Nausea and vomiting post chemotherapy             He struggled with chemotherapy including carboplatin.  He still requires daily assessment +/- IVF for the first week after treatment.              RTC daily this week for +/- IVF and +/- anti-emetics. 4. Bone metastasis             Consult [facility] neurosurgery (Dr Izora Ribas from [facility]) at the [facility] re: T4 pathologic fracture.             Last Xgeva on 05/16/2019.             RTC on 06/13/2019 for labs (BMP, albumen) and Xegva.  5. Cancer related pain Pain remains well controlled. Continue Fentanyl 75 mcg patch and oxycodone 10 mg p.o. every 4 hours as needed pain. 6. Anxiety Continue low dose Ativan. 7.   Transient shortness of breath and left sided pleuritic chest pain  Patient at risk for pulmonary embolism.  No intervening lower extremity edema.  Clinically well appearing.  Oxygen saturation is normal.  Check D-dimer.  If  elevated, chest CT angiogram to r/o pulmonary embolism. 8.   Normocytic anemia  Etiology felt secondary to chemotherapy induced anemia.  Check ferritin and iron stores.  Preauth Retacrit. 9.   RTC daily this week for +/- IVF and +/- anti-emetics. 10.   RTC on 06/13/2019 for labs (BMP, albumen) and Xgeva. 11.   RTC on 06/20/2019 for MD assessment, labs (CBC with diff, CMP, Mg, TSH, free T4), and cycle #5 Alimta and pembrolizumab.  Addendum:  D-dimer was 2887.13.  Chest CT angiogram STAT  today revealed tiny tiny subsegmental pulmonary embolus in the right lower lobe.  There were areas of ground-glass at the periphery of the right chest which have become more conspicuous since the previous exam.  This may relate to mild pneumonitis.   He noted intermittent lower extremity pain at night.  He has had no documented DVT.  We discussed anticoagulation with Coumadin (with Lovenox bridging) vs Eliquis vs Xarelto.  We discussed the pros and cons of each medication.  He inquired about medication assistance.  I spoke with Billey Chang, NP as well as Nuala Alpha, oral chemotherapy pharmacist.  A month trial of Eliquis (starter pack) was sent to [facility].  We discussed avoiding ibuprofen and possibly aspirin.  He is on 81 mg of aspirin.  He is to talk to his cardiologist to see if he needs to continue a  baby aspirin a day.  He will need to work on securing Eliquis beginning the second month ([employer]).  We will check nadir counts to ensure his platelet count remains adequate.  He has never had any documented thrombocytopenia.    We discussed referral to pulmonary medicine (Dr Mortimer Fries) regarding the opacities in his lung.  The etiology is unclear, but may be due to pembrolizumab.  Patient was understanding. Patient continued to deny pain with deep breathing. He stated his breathing was "fine". I spent an additional 30 minutes with the patient.   I discussed the assessment and treatment plan with the  patient.  The patient was provided an opportunity to ask questions and all were answered.  The patient agreed with the plan and demonstrated an understanding of the instructions.  The patient was advised to call back if the symptoms worsen or if the condition fails to improve as anticipated.   Lequita Asal, MD, PhD    05/30/2019, 4:25 PM  I, Selena Batten, am acting as scribe for [employer]. Mike Gip, MD, PhD.  I, Melissa C. Mike Gip, MD, have reviewed the above documentation for accuracy and completeness, and I agree with the above.

## 2019-05-30 NOTE — Progress Notes (Signed)
Pt arrived to infusion center in [place] for port lab draw, denies any concerns. Pt was seen by Dr. Mike Gip and returned to infusion for chemo. Additional lab orders received from MD. Collected and sent. Pt received pembrolizumab and pemetrexed. Tolerated well. D-dimer result reported to MD. Stat CT scan ordered. Radiology in [place] is not open today. Pt discharged to [facility] clinic for scan. Agreeable to plan. Pt stable and denies any complaints at discharge.

## 2019-05-30 NOTE — Addendum Note (Signed)
Addended by: Nolon Stalls C on: 05/30/2019 04:58 PM   Modules accepted: Level of Service

## 2019-05-31 ENCOUNTER — Telehealth: Payer: Self-pay

## 2019-05-31 ENCOUNTER — Inpatient Hospital Stay: Payer: Medicare Other

## 2019-05-31 VITALS — BP 128/72 | HR 55 | Temp 97.6°F | Resp 17

## 2019-05-31 DIAGNOSIS — C7801 Secondary malignant neoplasm of right lung: Secondary | ICD-10-CM | POA: Diagnosis not present

## 2019-05-31 DIAGNOSIS — Z5111 Encounter for antineoplastic chemotherapy: Secondary | ICD-10-CM | POA: Diagnosis not present

## 2019-05-31 DIAGNOSIS — E860 Dehydration: Secondary | ICD-10-CM

## 2019-05-31 DIAGNOSIS — F419 Anxiety disorder, unspecified: Secondary | ICD-10-CM | POA: Diagnosis not present

## 2019-05-31 DIAGNOSIS — C7951 Secondary malignant neoplasm of bone: Secondary | ICD-10-CM | POA: Diagnosis not present

## 2019-05-31 DIAGNOSIS — G893 Neoplasm related pain (acute) (chronic): Secondary | ICD-10-CM | POA: Diagnosis not present

## 2019-05-31 DIAGNOSIS — C3431 Malignant neoplasm of lower lobe, right bronchus or lung: Secondary | ICD-10-CM | POA: Diagnosis not present

## 2019-05-31 LAB — T4: T4, Total: 8.3 ug/dL (ref 4.5–12.0)

## 2019-05-31 MED ORDER — SODIUM CHLORIDE 0.9% FLUSH
10.0000 mL | Freq: Once | INTRAVENOUS | Status: AC | PRN
  Administered 2019-05-31: 14:00:00 10 mL
  Filled 2019-05-31: qty 10

## 2019-05-31 MED ORDER — HEPARIN SOD (PORK) LOCK FLUSH 100 UNIT/ML IV SOLN
500.0000 [IU] | Freq: Once | INTRAVENOUS | Status: AC | PRN
  Administered 2019-05-31: 500 [IU]
  Filled 2019-05-31: qty 5

## 2019-05-31 MED ORDER — SODIUM CHLORIDE 0.9 % IV SOLN
Freq: Once | INTRAVENOUS | Status: AC
  Administered 2019-05-31: 14:00:00 via INTRAVENOUS
  Filled 2019-05-31: qty 250

## 2019-05-31 NOTE — Telephone Encounter (Signed)
-----   Message from Lequita Asal, MD sent at 05/31/2019  8:54 AM EST ----- Regarding: Please call patient  Loma Sousa or [place],  Ensure he got his Eliquis and how he is taking it.  Ask who prescribed his baby aspirin.  I will contact them to determine if he can stop.    Robin,  Ensure he has a consult with Dr Mortimer Fries (pulmonologist) this week.  Order put in yesterday.   M ----- Message ----- From: Interface, Rad Results In Sent: 05/30/2019   2:20 PM EST To: Lequita Asal, MD

## 2019-05-31 NOTE — Telephone Encounter (Signed)
Oral Chemotherapy Pharmacy Student Encounter  Successfully signed the patient up for a copay card for Eliquis.  ID: [id number] BIN: [id number] Group: [id number] PCN: [id number]  Billing information will be shared with [facility] by Clearnce Sorrel, PharmD yesterday via telephone. I will place a copy of the card to be scanned into patient's chart.

## 2019-05-31 NOTE — Telephone Encounter (Signed)
Spoke with the patient to see how did he take the Eliquis, The patient report he took 2 pills last night and 2 pills this morning, He reports the sig read take 2 pill BID x 1 week , then 1 pill BID. The patient couldn't remember who had started the ASA . The patient was understanding and agreeable.

## 2019-06-01 ENCOUNTER — Inpatient Hospital Stay: Payer: Medicare Other | Admitting: Hospice and Palliative Medicine

## 2019-06-01 ENCOUNTER — Other Ambulatory Visit: Payer: Self-pay

## 2019-06-01 ENCOUNTER — Inpatient Hospital Stay: Payer: Medicare Other

## 2019-06-01 VITALS — BP 122/74 | HR 55 | Temp 97.5°F | Resp 16

## 2019-06-01 DIAGNOSIS — C7801 Secondary malignant neoplasm of right lung: Secondary | ICD-10-CM | POA: Diagnosis not present

## 2019-06-01 DIAGNOSIS — E860 Dehydration: Secondary | ICD-10-CM

## 2019-06-01 DIAGNOSIS — F419 Anxiety disorder, unspecified: Secondary | ICD-10-CM | POA: Diagnosis not present

## 2019-06-01 DIAGNOSIS — C3431 Malignant neoplasm of lower lobe, right bronchus or lung: Secondary | ICD-10-CM | POA: Diagnosis not present

## 2019-06-01 DIAGNOSIS — G893 Neoplasm related pain (acute) (chronic): Secondary | ICD-10-CM | POA: Diagnosis not present

## 2019-06-01 DIAGNOSIS — C7951 Secondary malignant neoplasm of bone: Secondary | ICD-10-CM | POA: Diagnosis not present

## 2019-06-01 DIAGNOSIS — Z5111 Encounter for antineoplastic chemotherapy: Secondary | ICD-10-CM | POA: Diagnosis not present

## 2019-06-01 MED ORDER — SODIUM CHLORIDE 0.9 % IV SOLN
Freq: Once | INTRAVENOUS | Status: DC
  Filled 2019-06-01: qty 250

## 2019-06-01 MED ORDER — HEPARIN SOD (PORK) LOCK FLUSH 100 UNIT/ML IV SOLN
500.0000 [IU] | Freq: Once | INTRAVENOUS | Status: AC | PRN
  Administered 2019-06-01: 500 [IU]

## 2019-06-01 MED ORDER — SODIUM CHLORIDE 0.9 % IV SOLN
INTRAVENOUS | Status: DC
  Administered 2019-06-01: 14:00:00 via INTRAVENOUS
  Filled 2019-06-01 (×2): qty 250

## 2019-06-01 MED ORDER — SODIUM CHLORIDE 0.9% FLUSH
10.0000 mL | Freq: Once | INTRAVENOUS | Status: AC | PRN
  Administered 2019-06-01: 10 mL
  Filled 2019-06-01: qty 10

## 2019-06-02 ENCOUNTER — Inpatient Hospital Stay: Payer: Medicare Other

## 2019-06-03 ENCOUNTER — Inpatient Hospital Stay: Payer: Medicare Other

## 2019-06-03 ENCOUNTER — Institutional Professional Consult (permissible substitution): Payer: Medicare Other | Admitting: Pulmonary Disease

## 2019-06-13 ENCOUNTER — Inpatient Hospital Stay: Payer: Medicare Other

## 2019-06-13 ENCOUNTER — Other Ambulatory Visit: Payer: Self-pay

## 2019-06-13 ENCOUNTER — Other Ambulatory Visit: Payer: Self-pay | Admitting: Hematology and Oncology

## 2019-06-13 ENCOUNTER — Other Ambulatory Visit: Payer: Self-pay | Admitting: *Deleted

## 2019-06-13 VITALS — BP 123/76 | HR 65 | Temp 97.0°F | Resp 18 | Wt 144.0 lb

## 2019-06-13 DIAGNOSIS — C7951 Secondary malignant neoplasm of bone: Secondary | ICD-10-CM | POA: Diagnosis not present

## 2019-06-13 DIAGNOSIS — C3431 Malignant neoplasm of lower lobe, right bronchus or lung: Secondary | ICD-10-CM

## 2019-06-13 DIAGNOSIS — R7989 Other specified abnormal findings of blood chemistry: Secondary | ICD-10-CM

## 2019-06-13 DIAGNOSIS — F419 Anxiety disorder, unspecified: Secondary | ICD-10-CM

## 2019-06-13 DIAGNOSIS — E860 Dehydration: Secondary | ICD-10-CM

## 2019-06-13 DIAGNOSIS — G893 Neoplasm related pain (acute) (chronic): Secondary | ICD-10-CM | POA: Diagnosis not present

## 2019-06-13 DIAGNOSIS — Z5111 Encounter for antineoplastic chemotherapy: Secondary | ICD-10-CM | POA: Diagnosis not present

## 2019-06-13 DIAGNOSIS — C7801 Secondary malignant neoplasm of right lung: Secondary | ICD-10-CM | POA: Diagnosis not present

## 2019-06-13 LAB — BASIC METABOLIC PANEL
Anion gap: 11 (ref 5–15)
BUN: 8 mg/dL (ref 8–23)
CO2: 21 mmol/L — ABNORMAL LOW (ref 22–32)
Calcium: 8.7 mg/dL — ABNORMAL LOW (ref 8.9–10.3)
Chloride: 102 mmol/L (ref 98–111)
Creatinine, Ser: 1.28 mg/dL — ABNORMAL HIGH (ref 0.61–1.24)
GFR calc Af Amer: >60 mL/min (ref >60)
GFR calc non Af Amer: 57 mL/min — ABNORMAL LOW (ref >60)
Glucose, Bld: 129 mg/dL — ABNORMAL HIGH (ref 70–99)
Potassium: 3.3 mmol/L — ABNORMAL LOW (ref 3.5–5.1)
Sodium: 134 mmol/L — ABNORMAL LOW (ref 135–145)

## 2019-06-13 LAB — ALBUMIN: Albumin: 3.9 g/dL (ref 3.5–5.0)

## 2019-06-13 MED ORDER — OXYCODONE HCL 20 MG PO TABS: 0.5000 | ORAL_TABLET | ORAL | 0 refills | Status: DC | PRN

## 2019-06-13 MED ORDER — SODIUM CHLORIDE 0.9 % IV SOLN
Freq: Once | INTRAVENOUS | Status: AC
  Administered 2019-06-13: 16:00:00 via INTRAVENOUS
  Filled 2019-06-13: qty 250

## 2019-06-13 MED ORDER — DENOSUMAB 120 MG/1.7ML ~~LOC~~ SOLN: 120.0000 mg | Freq: Once | SUBCUTANEOUS | Status: DC

## 2019-06-13 MED ORDER — HEPARIN SOD (PORK) LOCK FLUSH 100 UNIT/ML IV SOLN
500.0000 [IU] | Freq: Once | INTRAVENOUS | Status: AC
  Administered 2019-06-13: 500 [IU] via INTRAVENOUS
  Filled 2019-06-13: qty 5

## 2019-06-13 MED ORDER — ALPRAZOLAM 0.5 MG PO TABS: 0.5000 mg | ORAL_TABLET | Freq: Three times a day (TID) | ORAL | 0 refills | Status: DC | PRN

## 2019-06-13 MED ORDER — POTASSIUM CHLORIDE CRYS ER 20 MEQ PO TBCR: 20.0000 meq | EXTENDED_RELEASE_TABLET | Freq: Every day | ORAL | 0 refills | Status: DC

## 2019-06-13 MED ORDER — SODIUM CHLORIDE 0.9% FLUSH
10.0000 mL | INTRAVENOUS | Status: DC | PRN
  Administered 2019-06-13: 10 mL via INTRAVENOUS
  Filled 2019-06-13: qty 10

## 2019-06-13 NOTE — Progress Notes (Signed)
Potassium is low today. Asked pattient if he has had any diarrhea. 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 No diarrhea today. Creatinine is elevated . States he is not drinking much fluids. Orders for 533ml NS over 1 hour received. Patient unable to void while in clinic. Gave patient specimen cup . He will bring urinalysis in tomorrow.

## 2019-06-13 NOTE — Patient Instructions (Signed)
Patient was told MD is calling in 3 days of Potassium. Instructed him to increase potassium rich foods, ie potatoes, bananas.

## 2019-06-14 ENCOUNTER — Telehealth: Payer: Self-pay

## 2019-06-14 ENCOUNTER — Other Ambulatory Visit: Payer: Self-pay | Admitting: *Deleted

## 2019-06-14 DIAGNOSIS — C7951 Secondary malignant neoplasm of bone: Secondary | ICD-10-CM | POA: Diagnosis not present

## 2019-06-14 DIAGNOSIS — Z5111 Encounter for antineoplastic chemotherapy: Secondary | ICD-10-CM | POA: Diagnosis not present

## 2019-06-14 DIAGNOSIS — R197 Diarrhea, unspecified: Secondary | ICD-10-CM

## 2019-06-14 DIAGNOSIS — G893 Neoplasm related pain (acute) (chronic): Secondary | ICD-10-CM | POA: Diagnosis not present

## 2019-06-14 DIAGNOSIS — C3431 Malignant neoplasm of lower lobe, right bronchus or lung: Secondary | ICD-10-CM | POA: Diagnosis not present

## 2019-06-14 DIAGNOSIS — C7801 Secondary malignant neoplasm of right lung: Secondary | ICD-10-CM | POA: Diagnosis not present

## 2019-06-14 DIAGNOSIS — F419 Anxiety disorder, unspecified: Secondary | ICD-10-CM | POA: Diagnosis not present

## 2019-06-14 LAB — URINALYSIS, COMPLETE (UACMP) WITH MICROSCOPIC
Bilirubin Urine: NEGATIVE
Glucose, UA: NEGATIVE mg/dL
Hgb urine dipstick: NEGATIVE
Ketones, ur: NEGATIVE mg/dL
Leukocytes,Ua: NEGATIVE
Nitrite: NEGATIVE
Protein, ur: NEGATIVE mg/dL
Specific Gravity, Urine: 1.015 (ref 1.005–1.030)
pH: 6 (ref 5.0–8.0)

## 2019-06-14 LAB — CLOSTRIDIUM DIFFICILE BY PCR, REFLEXED: Toxigenic C. Difficile by PCR: POSITIVE — AB

## 2019-06-14 LAB — C DIFFICILE QUICK SCREEN W PCR REFLEX
C Diff antigen: POSITIVE — AB
C Diff toxin: NEGATIVE

## 2019-06-14 NOTE — Telephone Encounter (Signed)
Patient returned call after seeing he had missed call from RN.  Patient states he has had a lot going on and wants to continue palliative care services.  Patient in agreement with telephonic visit on 06-21-19 at 3:00 PM.

## 2019-06-14 NOTE — Telephone Encounter (Signed)
RN placed telephone call to patients phone to schedule palliative care visit.  Patient didn not answer phone and mailbox was full so RN could not leave message.  RN placed call to NP Josh Borders and left message informing him palliative team has been unable to reach patient.

## 2019-06-14 NOTE — Progress Notes (Unsigned)
stool

## 2019-06-15 ENCOUNTER — Encounter: Payer: Self-pay | Admitting: Pulmonary Disease

## 2019-06-15 ENCOUNTER — Ambulatory Visit (INDEPENDENT_AMBULATORY_CARE_PROVIDER_SITE_OTHER): Payer: Medicare Other | Admitting: Pulmonary Disease

## 2019-06-15 ENCOUNTER — Other Ambulatory Visit: Payer: Self-pay

## 2019-06-15 ENCOUNTER — Other Ambulatory Visit: Payer: Self-pay | Admitting: Oncology

## 2019-06-15 ENCOUNTER — Encounter: Payer: Self-pay | Admitting: Hematology and Oncology

## 2019-06-15 VITALS — BP 110/60 | HR 89 | Temp 97.3°F | Ht 66.0 in | Wt 144.0 lb

## 2019-06-15 DIAGNOSIS — I2699 Other pulmonary embolism without acute cor pulmonale: Secondary | ICD-10-CM

## 2019-06-15 DIAGNOSIS — C3431 Malignant neoplasm of lower lobe, right bronchus or lung: Secondary | ICD-10-CM

## 2019-06-15 DIAGNOSIS — J449 Chronic obstructive pulmonary disease, unspecified: Secondary | ICD-10-CM

## 2019-06-15 LAB — GASTROINTESTINAL PANEL BY PCR, STOOL (REPLACES STOOL CULTURE)

## 2019-06-15 MED ORDER — ANORO ELLIPTA 62.5-25 MCG/INH IN AEPB: 1.0000 | INHALATION_SPRAY | Freq: Every day | RESPIRATORY_TRACT | 0 refills | Status: AC

## 2019-06-15 MED ORDER — ALBUTEROL SULFATE HFA 108 (90 BASE) MCG/ACT IN AERS: 2.0000 | INHALATION_SPRAY | Freq: Four times a day (QID) | RESPIRATORY_TRACT | 3 refills | Status: DC | PRN

## 2019-06-15 MED ORDER — VANCOMYCIN HCL 125 MG PO CAPS: 125.0000 mg | ORAL_CAPSULE | Freq: Four times a day (QID) | ORAL | 0 refills | Status: DC

## 2019-06-15 NOTE — Patient Instructions (Addendum)
1.  We will give you a trial of Anoro Ellipta 1 inhalation daily.  Let us know if you have any difficulties getting it  2.  We will give you an inhaler to use as needed for shortness of breath  3.  We will see him in follow-up in 3 months time call sooner should any new difficulties arise.  4.  Do continue Eliquis.

## 2019-06-15 NOTE — Progress Notes (Signed)
He is no longer symptomatic. He has not had diarrhea in over 24 hours. Denies anti diarrheals. Good appetite. Able to eat and drink. No abdominal pain. Do we still need to treat? Reason why I ask is because the lab comments that he is positive for toxigenic C. difficile with little to no toxin production.  Only treat if clinical presentation suggests.  He had 2 bowel movements on Monday but other than that he has not had any additional, concerns.   Faythe Casa, NP 06/15/2019 2:26 PM

## 2019-06-15 NOTE — Progress Notes (Signed)
The patient is positive for C diff. The patient he was having diarrhea x 1-2 days. The patient Name and DOB has been verified by phone today.   The patient is currently not having any diarrhea at this time. cbg

## 2019-06-15 NOTE — Progress Notes (Signed)
Subjective:    Patient ID: Jimmy West, male    DOB: 1950/07/24, 68 y.o.   MRN: [id number] Chief Complaint  Patient presents with   pulmonary consult    per Dr. Mike Gip- CTA 05/30/2019. no current sx.     HPI Is a 68 year old former smoker who presents for evaluation of an abnormal CTA performed 30 May 2019 he is kindly referred by Dr. Nolon West.  The images independently shows a tiny subsegmental pulmonary embolus in the right lower lobe, areas of groundglass at the periphery of the right chest consistent with radiation changes.  Patient has a history of lung cancer patient is currently on Eliquis.  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  This was diagnosed in October 2019.  At that time he was also noted to have a T4 vertebral metastasis.  Cancer therefore was T4 N1 M1.  PD-L1 revealed TPS 90%.  He received 11 cycles of pembrolizumab between November 2019 and July 2020.  He tolerated that treatment well.  Completed T4 radiation in December 2019.  He gets Delton See monthly last dose was on 16 May 2019.  He also has been getting Alimta and pembrolizumab, last dose 09 May 2019, as well as getting B12 every 9 weeks.  He has been showing progressive regression of tumor and good response to therapy on this.  Chest also showed significant emphysema and we are asked to render opinion on this issue given his issues with shortness of breath.  The patient notes that since starting on Eliquis his shortness of breath has become somewhat better.  Does note shortness of breath on exertion that he has had for a number of years.  This was what prompted him to quit smoking.  He has not had any cough or sputum production.  No hemoptysis.  Tolerating Eliquis well.  Review of Systems A 10 point review of systems was performed and it is as  noted above otherwise negative.  Past Medical History:  Diagnosis Date   Bulging lumbar disc    Cancer ([place])    stage 4 lung cancer   Heart attack [facility])    Past Surgical History:  Procedure Laterality Date   CARDIAC CATHETERIZATION     two stents   KNEE SURGERY Left    PORTA CATH INSERTION N/A 05/21/2018   Procedure: PORTA CATH INSERTION;  Surgeon: Jimmy Huxley, MD;  Location: [facility];  Service: Cardiovascular;  Laterality: N/A;   Family History  Problem Relation Age of Onset   Heart failure Father    Cancer Maternal Aunt    Heart failure Maternal Uncle    Dementia Paternal Grandmother    Cancer Maternal Aunt    Social History   Tobacco Use   Smoking status: Former    Packs/day: 1.50    Years: 15.00    Pack years: 22.50    Types: Cigarettes    Quit date: 11/03/2003    Years since quitting: 17.5   Smokeless tobacco: Never  Substance Use Topics   Alcohol use: Not Currently   Current medications reviewed with patient these include: Tylenol, Xanax, Eliquis, calcium carbonate, dexamethasone, Duragesic, Prozac, Folvite, Zofran, oxycodone, MiraLAX as needed and Senokot.  He is also on Alimta and pembrolizumab as well as Xgeva (monthly).  Immunization History  Administered Date(s) Administered   Fluad Quad(high Dose 65+)  05/16/2019   Influenza, High Dose Seasonal PF 05/17/2018        Objective:   Physical Exam BP 110/60 (BP Location: Left Arm, Cuff Size: Normal)    Pulse 89    Temp (!) 97.3 F (36.3 C) (Temporal)    Ht 5' 6"  (1.676 m)    Wt 144 lb (65.3 kg)    SpO2 96%    BMI 23.24 kg/m  GENERAL: Well-developed, well-nourished gentleman, no acute distress. HEAD: Normocephalic, atraumatic.  EYES: Pupils equal, round, reactive to light.  No scleral icterus.  MOUTH: Nose/mouth/throat not examined due to masking requirements for COVID 19.  Patient reports he does use dentures. NECK: Supple. No thyromegaly. Trachea midline. No JVD.  No adenopathy. PULMONARY:  Good air entry bilaterally.  Coarse breath sounds otherwise, no adventitious sounds. CARDIOVASCULAR: S1 and S2. Regular rate and rhythm.  No rubs, murmurs or gallops heard. ABDOMEN: Benign. MUSCULOSKELETAL: No joint deformity, no clubbing, no edema.  NEUROLOGIC: No focal deficit, no gait disturbance, speech is fluent. SKIN: Intact,warm,dry. PSYCH: Mood and behavior normal.     Assessment & Plan:     ICD-10-CM   1. COPD suggested by initial evaluation ([place])  J44.9    Trial of Anoro Ellipta Will need PFTs, patient wants to defer at present Significant emphysema on CT chest    2. Pulmonary embolism on right ([place]), small subsegmental RLL  I26.99    On Eliquis Related to hypercoagulability associated with malignancy Recommend ongoing anticoagulation    3. Primary cancer of right lower lobe of lung (H[facility])  C34.31    Stage IV Management per oncology This issue adds complexity to his management      Meds ordered this encounter  Medications   albuterol (VENTOLIN HFA) 108 (90 Base) MCG/ACT inhaler    Sig: Inhale 2 puffs into the lungs every 6 (six) hours as needed for wheezing or shortness of breath.    Dispense:  18 g    Refill:  3   umeclidinium-vilanterol (ANORO ELLIPTA) 62.5-25 MCG/INH AEPB    Sig: Inhale 1 puff into the lungs daily for 1 day.    Dispense:  7 each    Refill:  0    Order Specific Question:   Lot Number?    Answer:   [id number]    Order Specific Question:   Expiration Date?    Answer:   04/20/2020    Order Specific Question:   Manufacturer?    Answer:   [employer] [[id number]]    Order Specific Question:   Quantity    Answer:   1   Patient's issues with dyspnea are multifactorial likely related to undertreated COPD.  He would like to defer on PFTs at present.  We will give him a trial of Anoro Ellipta 1 inhalation daily as it appears that his main point of COPD is emphysema.  He also received albuterol as needed.  He is appropriately on anticoagulation for his small  subsegmental PE noted on the right lower lobe.  Given his history with malignancy he will need to be on this indefinitely.  At present I do not believe that he is having any pulmonary toxicity related to pembrolizumab.  The groundglass changes noted on CT were very minimal and in close proximity to the area of the PE.  It may be an area of focal edema.  We will see the patient in follow-up in 3 months time he is to call sooner should any new problems arise.  Renold Don,  MD Advanced Bronchoscopy [facility]    *This note was dictated using voice recognition software/Dragon.  Despite best efforts to proofread, errors can occur which can change the meaning.  Any change was purely unintentional.

## 2019-06-15 NOTE — Progress Notes (Signed)
Re: positive Toxigenic colostrum difficile; little to know toxin production  Positive antigen Negative toxin  Rx vancomycin 125 mg tablets 4 times daily x7 days.  Will verify with Dr. Mike Gip that patient needs to begin treatment. He is currently asymptomatic.  He denies any additional diarrhea.  Last bowel movement was approximately 36 hours ago.  He has not taken any antidiarrheals.  He denies any abdominal pain.  His appetite is normal.  He is able to tolerate all foods and liquids.  Faythe Casa, NP 06/15/2019 3:01 PM

## 2019-06-18 NOTE — Progress Notes (Signed)
[facility]  [address] [place], [place] [address] Phone: [phone]  Fax: [phone]   Clinic Day:  06/20/2019  Referring physician: [name], NP  Chief Complaint: Jimmy West is a 68 y.o. male with with metastatic adenocarcinoma of the lung who is seen for assessment prior to cycle #5Alimta and pembrolizumab.  HPI: The patient was last seen in the medical oncology clinic on 05/30/2019. At that time,  he felt "pretty good".  He described some transient L >> R lateral chest discomfort.  There appeared to have been a pleuritic component to the left side.  Exam is unremarkable.  He received Alimta and pembrolizumab.  Chest CT angiogram on 05/30/2019 revealed a tiny subsegmental pulmonary embolus in the right lower lobe.  There areas of ground-glass at the periphery of the right chest had become more conspicuous. This may relate to mild pneumonitis, perhaps due to radiation, attention on follow-up. The dominant nodule in the right lung base was stable in size. He was prescribed Eliquis.  He tested positive for toxigenic C diff by PCR on 06/14/2019.  There was little to no toxin production.  He was seen by Dr. Patsey Berthold on 06/15/2019.  She prescribed Albuterol inhaler.  He was given a trial of Anoro Ellipta daily and was given "another prescription that was $75" that was to take PRN.   During the interim, he has felt pretty good. He had diarrhea when he saw Lanette Hampshire, NP.  He describes diarrhea 5 times a day but other days he wouldn't have any. Some days he would have only 1 episode of diarrhea. He has been taking oral vancomycin since Friday, 06/17/2019. He hasn't had any diarrhea since taking vancomycin.   Symptomatically he denies any problems. He hasn't noticed any difference in his breathing.   He hasn't been taking Eliquis as prescribed.  He was a bit confused on how to take medicine. He is now taking 1 pill twice a day. He  denies any bleeding.   He notes that his mother passed away from C diff 7-8 years ago.    Past Medical History:  Diagnosis Date   Bulging lumbar disc    Cancer ([place])    stage 4 lung cancer   Heart attack [facility])     Past Surgical History:  Procedure Laterality Date   CARDIAC CATHETERIZATION     two stents   KNEE SURGERY Left    PORTA CATH INSERTION N/A 05/21/2018   Procedure: PORTA CATH INSERTION;  Surgeon: Algernon Huxley, MD;  Location: [facility];  Service: Cardiovascular;  Laterality: N/A;    Family History  Problem Relation Age of Onset   Heart failure Father    Cancer Maternal Aunt    Heart failure Maternal Uncle    Dementia Paternal Grandmother    Cancer Maternal Aunt     Social History:  reports that he quit smoking about 15 years ago. His smoking use included cigarettes. He has a 22.50 pack-year smoking history. He has never used smokeless tobacco. He reports current alcohol use of about 15.0 standard drinks of alcohol per week. He reports that he does not use drugs.  He started smoking at age 57. He is smoking 1 1/2 packs/day. Patient denies known exposures to radiation ortoxins. 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 He has not been working recently as the salon was closed.  He plays golf occasionally.He has 8 cats. The patient is  alone today.  Allergies: No Known Allergies  Current Medications: Current Outpatient Medications  Medication Sig Dispense Refill   acetaminophen (TYLENOL) 500 MG tablet Take 500 mg by mouth 3 (three) times daily as needed.     albuterol (VENTOLIN HFA) 108 (90 Base) MCG/ACT inhaler Inhale 2 puffs into the lungs every 6 (six) hours as needed for wheezing or shortness of breath. 18 g 3   ALPRAZolam (XANAX) 0.5 MG tablet Take 1 tablet (0.5 mg total) by mouth 3 (three) times daily as needed for anxiety. 60 tablet 0   apixaban (ELIQUIS) 5 MG TABS tablet Take 2 tablets (1m) twice daily for 7  days, then 1 tablet (552m twice daily 60 tablet 0   Calcium 600-400 MG-UNIT CHEW Chew 2 tablets by mouth daily.     fentaNYL (DURAGESIC) 75 MCG/HR Place 1 patch onto the skin every 3 (three) days. 10 patch 0   FLUoxetine (PROZAC) 40 MG capsule Take 1 capsule (40 mg total) by mouth daily. 30 capsule 3   folic acid (FOLVITE) 1 MG tablet Take 1 tablet (1 mg total) by mouth daily. Start 5-7 days before Alimta chemotherapy. Continue until 21 days after Alimta completed. 100 tablet 3   Oxycodone HCl 20 MG TABS Take 0.5-1 tablets (10-20 mg total) by mouth every 4 (four) hours as needed (for severe pain). 90 tablet 0   potassium chloride SA (KLOR-CON) 20 MEQ tablet Take 1 tablet (20 mEq total) by mouth daily. 3 tablet 0   aspirin EC 81 MG tablet Take 81 mg by mouth daily.     dexamethasone (DECADRON) 4 MG tablet Take 1 tab two times a day the day before Alimta chemo, then take 2 tabs once a day for 3 days starting the day after chemo. 30 tablet 1   ibuprofen (ADVIL,MOTRIN) 200 MG tablet Take 200 mg by mouth every 6 (six) hours as needed.     ondansetron (ZOFRAN ODT) 4 MG disintegrating tablet Take 1 tablet (4 mg total) by mouth every 6 (six) hours as needed for nausea or vomiting. (Patient not taking: Reported on 06/15/2019) 30 tablet 1   polyethylene glycol (MIRALAX / GLYCOLAX) packet Take 17 g by mouth daily.     senna (SENOKOT) 8.6 MG tablet Take 1 tablet by mouth as needed.      vancomycin (VANCOCIN HCL) 125 MG capsule Take 1 capsule (125 mg total) by mouth 4 (four) times daily. 28 capsule 0   No current facility-administered medications for this visit.    Facility-Administered Medications Ordered in Other Visits  Medication Dose Route Frequency Provider Last Rate Last Dose   0.9 %  sodium chloride infusion   Intravenous Once Kaelene Elliston C, MD       0.9 %  sodium chloride infusion   Intravenous Once PRN Alorah Mcree C, MD       albuterol (PROVENTIL) (2.5 MG/3ML) 0.083%  nebulizer solution 2.5 mg  2.5 mg Nebulization Once PRN Calley Drenning C, MD       alteplase (CATHFLO ACTIVASE) injection 2 mg  2 mg Intracatheter Once PRN CoLequita AsalMD       EPINEPHrine (ADRENALIN) 1 MG/10ML injection 0.25 mg  0.25 mg Intravenous Once PRN Jakeira Seeman C, MD       EPINEPHrine (ADRENALIN) 1 MG/10ML injection 0.25 mg  0.25 mg Intravenous Once PRN Christene Pounds C, MD       heparin lock flush 100 unit/mL  500 Units Intracatheter Once PRN CoLequita AsalMD  heparin lock flush 100 unit/mL  250 Units Intracatheter Once PRN Lequita Asal, MD       heparin lock flush 100 unit/mL  500 Units Intravenous Once Nicklaus Alviar C, MD       sodium chloride flush (NS) 0.9 % injection 10 mL  10 mL Intravenous PRN Nolon Stalls C, MD   10 mL at 03/04/19 0959   sodium chloride flush (NS) 0.9 % injection 10 mL  10 mL Intracatheter Once PRN Lequita Asal, MD       sodium chloride flush (NS) 0.9 % injection 10 mL  10 mL Intravenous PRN Nolon Stalls C, MD   10 mL at 06/20/19 0768   sodium chloride flush (NS) 0.9 % injection 3 mL  3 mL Intracatheter Once PRN Lequita Asal, MD        Review of Systems  Constitutional: Positive for malaise/fatigue (x 3-4 days fair) and weight loss (3 pounds). Negative for chills, diaphoresis and fever.       Feels "pretty good" today.  HENT: Negative.  Negative for congestion, ear pain, nosebleeds, sinus pain and sore throat.   Eyes: Negative.  Negative for blurred vision and double vision.  Respiratory: Positive for cough (slight). Negative for hemoptysis, sputum production, shortness of breath (improving) and wheezing.   Cardiovascular: Negative.  Negative for chest pain, palpitations, orthopnea, leg swelling and PND.  Gastrointestinal: Positive for diarrhea (resolved with antibiotics). Negative for abdominal pain, blood in stool, constipation, melena, nausea and vomiting.       Eating well.    Genitourinary: Negative.  Negative for dysuria, flank pain, frequency, hematuria and urgency.  Musculoskeletal: Negative.  Negative for back pain, falls, joint pain, myalgias and neck pain.  Skin: Negative.  Negative for rash.  Neurological: Negative.  Negative for dizziness, tingling, tremors, sensory change, speech change, focal weakness, weakness and headaches.  Endo/Heme/Allergies: Negative.  Negative for environmental allergies. Does not bruise/bleed easily.  Psychiatric/Behavioral: Negative.  Negative for depression and memory loss. The patient is not nervous/anxious (chronic on Xanax) and does not have insomnia.   All other systems reviewed and are negative.  Performance status (ECOG):  1  Vitals Blood pressure 120/78, pulse 82, temperature 97.8 F (36.6 C), temperature source Tympanic, resp. rate 18, weight 148 lb 11.2 oz (67.4 kg), SpO2 97 %.   Physical Exam  Constitutional: He is oriented to person, place, and time. He appears well-developed and well-nourished. No distress.  HENT:  Head: Normocephalic and atraumatic.  Mouth/Throat: Oropharynx is clear and moist. No oropharyngeal exudate.  Thin graying hair.  Mustache.  Dentures.  Mask.  Eyes: Pupils are equal, round, and reactive to light. Conjunctivae and EOM are normal. No scleral icterus.  Glasses.  Gray blue eyes.  Neck: Normal range of motion. Neck supple. No JVD present.  Cardiovascular: Normal rate, regular rhythm and normal heart sounds. Exam reveals no gallop and no friction rub.  No murmur heard. Pulmonary/Chest: Effort normal and breath sounds normal. No respiratory distress. He has no wheezes. He has no rales. He exhibits no tenderness.  Abdominal: Soft. Bowel sounds are normal. He exhibits no distension and no mass. There is no abdominal tenderness. There is no rebound and no guarding.  Musculoskeletal: Normal range of motion.        General: No tenderness or edema.  Lymphadenopathy:       Head (right side): No  preauricular, no posterior auricular and no occipital adenopathy present.       Head (left  side): No preauricular, no posterior auricular and no occipital adenopathy present.    He has no cervical adenopathy.    He has no axillary adenopathy.       Right: No inguinal and no supraclavicular adenopathy present.       Left: No inguinal and no supraclavicular adenopathy present.  Neurological: He is alert and oriented to person, place, and time.  Skin: Skin is warm and dry. No rash noted. He is not diaphoretic. No erythema. No pallor.  Psychiatric: He has a normal mood and affect. His behavior is normal. Judgment and thought content normal.  Nursing note and vitals reviewed.   Infusion on 06/20/2019  Component Date Value Ref Range Status   Sodium 06/20/2019 135  135 - 145 mmol/L Final   Potassium 06/20/2019 4.2  3.5 - 5.1 mmol/L Final   Chloride 06/20/2019 101  98 - 111 mmol/L Final   CO2 06/20/2019 23  22 - 32 mmol/L Final   Glucose, Bld 06/20/2019 110* 70 - 99 mg/dL Final   BUN 06/20/2019 11  8 - 23 mg/dL Final   Creatinine, Ser 06/20/2019 0.97  0.61 - 1.24 mg/dL Final   Calcium 06/20/2019 9.7  8.9 - 10.3 mg/dL Final   Total Protein 06/20/2019 7.4  6.5 - 8.1 g/dL Final   Albumin 06/20/2019 3.8  3.5 - 5.0 g/dL Final   AST 06/20/2019 21  15 - 41 U/L Final   ALT 06/20/2019 11  0 - 44 U/L Final   Alkaline Phosphatase 06/20/2019 68  38 - 126 U/L Final   Total Bilirubin 06/20/2019 0.8  0.3 - 1.2 mg/dL Final   GFR calc non Af Amer 06/20/2019 >60  >60 mL/min Final   GFR calc Af Amer 06/20/2019 >60  >60 mL/min Final   Anion gap 06/20/2019 11  5 - 15 Final   Performed at [facility], [address] [place], [place] [address]   WBC 06/20/2019 6.5  4.0 - 10.5 K/uL Final   RBC 06/20/2019 3.16* 4.22 - 5.81 MIL/uL Final   Hemoglobin 06/20/2019 9.4* 13.0 - 17.0 g/dL Final   HCT 06/20/2019 29.2* 39.0 - 52.0 % Final   MCV 06/20/2019 92.4  80.0 - 100.0 fL Final    MCH 06/20/2019 29.7  26.0 - 34.0 pg Final   MCHC 06/20/2019 32.2  30.0 - 36.0 g/dL Final   RDW 06/20/2019 16.6* 11.5 - 15.5 % Final   Platelets 06/20/2019 421* 150 - 400 K/uL Final   nRBC 06/20/2019 0.0  0.0 - 0.2 % Final   Neutrophils Relative % 06/20/2019 78  % Final   Neutro Abs 06/20/2019 5.2  1.7 - 7.7 K/uL Final   Lymphocytes Relative 06/20/2019 9  % Final   Lymphs Abs 06/20/2019 0.6* 0.7 - 4.0 K/uL Final   Monocytes Relative 06/20/2019 12  % Final   Monocytes Absolute 06/20/2019 0.8  0.1 - 1.0 K/uL Final   Eosinophils Relative 06/20/2019 0  % Final   Eosinophils Absolute 06/20/2019 0.0  0.0 - 0.5 K/uL Final   Basophils Relative 06/20/2019 0  % Final   Basophils Absolute 06/20/2019 0.0  0.0 - 0.1 K/uL Final   Immature Granulocytes 06/20/2019 1  % Final   Abs Immature Granulocytes 06/20/2019 0.03  0.00 - 0.07 K/uL Final   Performed at [facility], [address], [place], [place] [address]    Assessment:  Jimmy West is a 68 y.o. male with metastatichigh-grade adenocarcinomaof the right lungs/p CT-guided biopsy of  a RLL lung mass on 05/11/2018. Pathologyrevealed an invasive high-grade adenocarcinoma, with predominantly solid growth pattern. The neoplastic cells were TTF-1 (+), Napsin A (+), and P40 (+). He has a T4 vertebral metastasis. Clinical stage is T4N1M1.  There was not enough material for Foundation One testing. PDL-1revealed TPS 90%.  PET scanon 04/30/2018 revealed a 3.4 cm hypermetabolic RLL pulmonary mass (SUV 13.2), 11 mm pulmonary nodule in the LEFT lung (SUV 4.5), RIGHT paratracheal lymph node (SUV 5.1), and hypermetabolic activity within the T4 vertebral body (SUV 10.2).   Thoracic spine MRIon 05/03/2018 revealed T4 metastasiswith a 40% pathologic compression deformity and 5 mm of retropulsion of the vertebral body. Retropulsion results in mild spinal canal stenosis and mild bilateral C4-5 foraminal stenosis. There  was paravertebral soft tissue thickening from mid T3 to mid T5, likely representing edema related to the pathologic compression deformity vs. possible extraosseous extension of the neoplasm. There were no additional thoracic spinal metastases noted.   Head MRIon 05/03/2018 revealed no intracranial metastatic disease. 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 Incidental mention made of mild paranasal sinus disease.  Hereceived 11cycles ofpembrolizumab(05/24/2018 - 02/07/2019). He toleratedtreatment well. CEAwas 1.3 on 08/30/2018.LDHwas 165 on 11/29/2018.  He completed T4 radiationon 07/07/2018. He receives Xgevamonthly (06/03/2018 - 05/16/2019).  He received 1 cycle of carboplatin, Alimta, and pembrolizumabon 02/08/2019. Cycle #1 was complicated by nausea, vomiting, and diarrhea necessitating hospitalization.Decision made to hold carboplatin with his second dose.He iss/p2 cycles ofAlimta and pembrolizumab (02/28/2019 - 03/29/2019).Cycle #2 was complicated by nausea, vomiting, and dehydration requring fluids in clinic and an overnight stay in the hospital. He received carboplatin (AUC 2), Alimta, and pebrolizumab(04/18/2019).  He tolerated it poorly.  He received Alimta and pembrolizumab on 05/09/2019 (last 05/30/2019).  He tolerated well.  He receives B12 every 9 weeks (last 05/25/2019).  He hascancer-related painin T4. He is currently taking Fentanyl75 mcg/hr and oxycodone10 mg every 4 hours prn.  Cervical and thoracic spine CT at [facility]/10/2019 revealed unchanged severe compression deformity/vertebral plana of T4 with a stable degree of retropulsion and focal mild spinal canal stenosis. There was interval enlargement of a right lower lobe pulmonary mass(4.5 x 3.5 cm compared to 3.9 x  2.9 cm)with redemonstrated spiculated pulmonary nodules.  Chest, abdomen, pelvisCTon 01/28/2019 revealed slight interval enlargement of a right lower lobe mass with central necrosis measuring 4.6 x 3.4 cm, previously 4.0 x 3.0 cm. There was no change in right upper lobe nodules(1.4 and 0.8 cm).Therewas almost no residua of left lung nodules, with irregular opacities in the apical left upper lobeandsuperior segment left lower lobe. There was no change in right hilar soft tissue and lymph nodes.There wasvertebra plana deformity of T4andno evidence of new osseous metastatic disease. No evidence of distant metastatic disease in the abdomen or pelvis.The1.6 x 1.1 cmleft adrenal nodule(non-metabolic on prior PET scan),was unchanged.  Abdomen and pelvis CTon 04/23/2019 revealed no acute abdominal or pelvic pathology. There was fluid in the colon which can be seen with diarrhea. There was diverticulosis without evidence of diverticulitis. There was a stable 1.5 cm left adrenal nodule.  Chest, abdomen, and pelvisCTon 10/16/2020revealed a positive response to interval therapy for the known right lower lobe lung carcinoma(4.6 x 3.4 cm to3.1 x 2.8 cm transversely). There hadbeen a mild interval decrease contiguous soft tissue that extends along the right hilum. The two right upper lobe  noduleswerestable from the most recent prior study (smaller than 04/27/2018).There are no new lung nodules.Therewasno evidence of new metastatic disease.There was stable severe compression deformity/vertebra plana of T4.Therewasno evidence of other osseous metastatic disease.There was a stable left adrenal nodule consistent with an adenoma.  Chest CT angiogram on 05/30/2019 revealed a tiny subsegmental pulmonary embolus in the right lower lobe.  There areas of ground-glass at the periphery of the right chest had become more conspicuous. This may relate to mild pneumonitis, perhaps due to  radiation, attention on follow-up. The dominant nodule in the right lung base was stable in size. He is on Eliquis.  Hehad transient renal insufficiencyon 03/23/2019. Creatinine was 1.32 (baseline 0.61-1.0). Renal ultrasoundon 03/24/2019 revealed no acute abnormality identified.There was no hydronephrosis or bladder distention.Creatinine is 0.82 today.  Stool was positive for C difficile + diarrhea on 06/14/2019.  He has been on oral vancomycin since 06/17/2019.  He received the influenza vaccine on 05/16/2019.  Symptomatically, his diarrhea has resolved.  Exam is unremarkable.  Plan: 1.  Labs today: CBC with diff, CMP, Mg, TSH, free T4 2. Metastatic high-grade adenocarcinoma the RIGHT lung He is s/p 11 cycles of pembrolizumab. He is s/p 1 cycle of carboplatin, Almita, and pembrolizumab (02/08/2019). He is s/p 1 cycle of carboplatin (AUC 2), Alimta, and pembrolizumab (04/18/2019). He is s/p 4 cycles of Alimta and pembrolizumab  (02/28/2019 - 03/29/2019; 05/09/2019 - 05/30/2019).             He is tolerating treatment without carboplatin better. Labs reviewed.  Postpone cycle #5 Alimta and pembrolizumab 1 week secondary to ongoing C diff treatment.  Refill Decadron premed.  Continue B12 every 9 weeks (last 05/25/2019).  Continue folic acid. Discuss symptom management.  He has antiemetics and pain medications at home to use on a prn bases.  Interventions are adequate.   .     3. Nausea and vomiting post chemotherapy He struggled with chemotherapy including carboplatin.             He receives IVF post chemotherapy.  No IVF needed this week. 4. Bone metastasis Encourage follow-up with [facility] neurosurgery. Last Xgeva on 05/16/2019. Xgeva today. 5. Cancer related pain Pain is well controlled. Continue Fentanyl 75 mcg patch and oxycodone 10 mg p.o. every 4 hours as needed pain. 6.  Anxiety Continue low dose Xanax. 7.   Pulmonary embolism  Chest CT angiogram on 05/30/2019 confirmed a subsegmental pulmonary embolism.  Patient initially confused on how to take Eliquis.  Directions reviewed.  Continue Eliquis. 8.Normocytic anemia             Hematocrit 29.2.  Hemoglobin 9.4.  MCV 92.4 today.   Ferritin 687 with an iron saturation of 26% and a TIBC of 335 on 05/30/2019.  Etiology felt secondary to chemotherapy induced anemia.             Anticipate initiation of Retacrit every 2 weeks.   Information provided.   Preauth Retacrit. 9.   C diff + diarrhea  Patient on oral vancomycin.  Diarrhea resolved.  Complete oral vancomycin prior to reinitiation of chemotherapy. 10.   RTC in 1 week for MD assessment, labs (CBC with diff, CMP, Mg), and cycle #5 Alimta and pembrolizumab.  I discussed the assessment and treatment plan with the patient.  The patient was provided an opportunity to ask questions and all were answered.  The patient agreed with the plan and demonstrated an understanding of the instructions.  The patient was advised to call  back if the symptoms worsen or if the condition fails to improve as anticipated.  I provided 21 minutes (10:50 AM - 11:10 AM) of face-to-face time during this this encounter and > 50% was spent counseling as documented under my assessment and plan.    Lequita Asal, MD, PhD    06/20/2019, 11:10 AM  I, Samul Dada, am acting as a scribe for Lequita Asal, MD.  I, [place] Mike Gip, MD, have reviewed the above documentation for accuracy and completeness, and I agree with the above.

## 2019-06-20 ENCOUNTER — Inpatient Hospital Stay (HOSPITAL_BASED_OUTPATIENT_CLINIC_OR_DEPARTMENT_OTHER): Payer: Medicare Other | Admitting: Hematology and Oncology

## 2019-06-20 ENCOUNTER — Encounter: Payer: Self-pay | Admitting: Hematology and Oncology

## 2019-06-20 ENCOUNTER — Inpatient Hospital Stay: Payer: Medicare Other

## 2019-06-20 ENCOUNTER — Other Ambulatory Visit: Payer: Self-pay

## 2019-06-20 VITALS — BP 120/78 | HR 82 | Temp 97.8°F | Resp 18 | Wt 148.7 lb

## 2019-06-20 DIAGNOSIS — Z7189 Other specified counseling: Secondary | ICD-10-CM | POA: Diagnosis not present

## 2019-06-20 DIAGNOSIS — I2699 Other pulmonary embolism without acute cor pulmonale: Secondary | ICD-10-CM | POA: Diagnosis not present

## 2019-06-20 DIAGNOSIS — F419 Anxiety disorder, unspecified: Secondary | ICD-10-CM

## 2019-06-20 DIAGNOSIS — C7951 Secondary malignant neoplasm of bone: Secondary | ICD-10-CM | POA: Diagnosis not present

## 2019-06-20 DIAGNOSIS — A0472 Enterocolitis due to Clostridium difficile, not specified as recurrent: Secondary | ICD-10-CM | POA: Diagnosis not present

## 2019-06-20 DIAGNOSIS — C3431 Malignant neoplasm of lower lobe, right bronchus or lung: Secondary | ICD-10-CM

## 2019-06-20 DIAGNOSIS — G893 Neoplasm related pain (acute) (chronic): Secondary | ICD-10-CM

## 2019-06-20 DIAGNOSIS — C7801 Secondary malignant neoplasm of right lung: Secondary | ICD-10-CM | POA: Diagnosis not present

## 2019-06-20 DIAGNOSIS — Z5111 Encounter for antineoplastic chemotherapy: Secondary | ICD-10-CM | POA: Diagnosis not present

## 2019-06-20 LAB — CBC WITH DIFFERENTIAL/PLATELET
Abs Immature Granulocytes: 0.03 10*3/uL (ref 0.00–0.07)
Basophils Absolute: 0 10*3/uL (ref 0.0–0.1)
Basophils Relative: 0 %
Eosinophils Absolute: 0 10*3/uL (ref 0.0–0.5)
Eosinophils Relative: 0 %
HCT: 29.2 % — ABNORMAL LOW (ref 39.0–52.0)
Hemoglobin: 9.4 g/dL — ABNORMAL LOW (ref 13.0–17.0)
Immature Granulocytes: 1 %
Lymphocytes Relative: 9 %
Lymphs Abs: 0.6 10*3/uL — ABNORMAL LOW (ref 0.7–4.0)
MCH: 29.7 pg (ref 26.0–34.0)
MCHC: 32.2 g/dL (ref 30.0–36.0)
MCV: 92.4 fL (ref 80.0–100.0)
Monocytes Absolute: 0.8 10*3/uL (ref 0.1–1.0)
Monocytes Relative: 12 %
Neutro Abs: 5.2 10*3/uL (ref 1.7–7.7)
Neutrophils Relative %: 78 %
Platelets: 421 10*3/uL — ABNORMAL HIGH (ref 150–400)
RBC: 3.16 MIL/uL — ABNORMAL LOW (ref 4.22–5.81)
RDW: 16.6 % — ABNORMAL HIGH (ref 11.5–15.5)
WBC: 6.5 10*3/uL (ref 4.0–10.5)
nRBC: 0 % (ref 0.0–0.2)

## 2019-06-20 LAB — COMPREHENSIVE METABOLIC PANEL
ALT: 11 U/L (ref 0–44)
AST: 21 U/L (ref 15–41)
Albumin: 3.8 g/dL (ref 3.5–5.0)
Alkaline Phosphatase: 68 U/L (ref 38–126)
Anion gap: 11 (ref 5–15)
BUN: 11 mg/dL (ref 8–23)
CO2: 23 mmol/L (ref 22–32)
Calcium: 9.7 mg/dL (ref 8.9–10.3)
Chloride: 101 mmol/L (ref 98–111)
Creatinine, Ser: 0.97 mg/dL (ref 0.61–1.24)
GFR calc Af Amer: >60 mL/min (ref >60)
GFR calc non Af Amer: >60 mL/min (ref >60)
Glucose, Bld: 110 mg/dL — ABNORMAL HIGH (ref 70–99)
Potassium: 4.2 mmol/L (ref 3.5–5.1)
Sodium: 135 mmol/L (ref 135–145)
Total Bilirubin: 0.8 mg/dL (ref 0.3–1.2)
Total Protein: 7.4 g/dL (ref 6.5–8.1)

## 2019-06-20 LAB — TSH: TSH: 0.491 u[IU]/mL (ref 0.350–4.500)

## 2019-06-20 MED ORDER — SODIUM CHLORIDE 0.9% FLUSH
10.0000 mL | INTRAVENOUS | Status: DC | PRN
  Administered 2019-06-20: 10 mL via INTRAVENOUS
  Filled 2019-06-20: qty 10

## 2019-06-20 MED ORDER — DENOSUMAB 120 MG/1.7ML ~~LOC~~ SOLN
120.0000 mg | Freq: Once | SUBCUTANEOUS | Status: AC
  Administered 2019-06-20: 120 mg via SUBCUTANEOUS

## 2019-06-20 MED ORDER — HEPARIN SOD (PORK) LOCK FLUSH 100 UNIT/ML IV SOLN
500.0000 [IU] | Freq: Once | INTRAVENOUS | Status: AC
  Administered 2019-06-20: 500 [IU] via INTRAVENOUS
  Filled 2019-06-20: qty 5

## 2019-06-20 MED ORDER — DEXAMETHASONE 4 MG PO TABS: ORAL_TABLET | ORAL | 1 refills | Status: DC

## 2019-06-20 MED ORDER — DENOSUMAB 120 MG/1.7ML ~~LOC~~ SOLN
SUBCUTANEOUS | Status: AC
  Filled 2019-06-20: qty 1.7

## 2019-06-20 NOTE — Patient Instructions (Signed)

## 2019-06-20 NOTE — Progress Notes (Signed)
Patient here for follow up. Denies any concerns.  

## 2019-06-20 NOTE — Progress Notes (Signed)
Patient currently taking oral vancomycin for C Diff. Chemotherapy deterred until next Monday. Xgeva only today. Port deaccessed . Patient discharged to home.

## 2019-06-21 ENCOUNTER — Other Ambulatory Visit: Payer: Medicare Other

## 2019-06-21 DIAGNOSIS — Z515 Encounter for palliative care: Secondary | ICD-10-CM

## 2019-06-21 LAB — T4: T4, Total: 8.8 ug/dL (ref 4.5–12.0)

## 2019-06-21 NOTE — Progress Notes (Signed)
PATIENT NAME: Jimmy West DOB: 11-02-1950 MRN: [id number]  PRIMARY CARE PROVIDER: [name], NP  RESPONSIBLE PARTY:  Acct ID - Guarantor Home Phone Work Phone Relationship Acct Type  [id number] [name], [name]  Self P/F     [address], [address], [place], [place] [address]    PLAN OF CARE and INTERVENTIONS:               1.  GOALS OF CARE/ ADVANCE CARE PLANNING:  Remain at home and continue to receive chemo.               2.  PATIENT/CAREGIVER EDUCATION:  Education on s/s of infection, education on fall precautions, support                 3.  DISEASE STATUS:Due to the COVID-19 crisis, this visit was done via telephonic from my office and it was initiated and consent by this patient and or family. RN completed telephonic visit per patients request. Patient answers phone and reports he is doing pretty good today. Patient reports he was unable to receive chemo treatment yesterday due to labs not being normal. Patient continues to receive chemo monthly then off chemo for 3 weeks. Patient reports he is not working at the present time so pain is in better control. Patient reports pain was much worse when he was working. 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 Patient remains on Fentanyl 75 mcgs and Oxycodone 10 mg prn for pain.  Patient currently being treated for C-diff. Patient recently had PE and is currently on Eliquis.  Patient reports he will gain weight when he is not receiving chemo then will lose weight with chemo treatment.  Patient reports he is tolerating chemo somewhat better. Patient states scan showed tumor had decreased in size since receiving chemo. Patient reports his weight is currently around 145 pounds. Patient reports he has shortness of breath on exertion. Patient denies having any edema in his lower extremities. Patient reports he has been sleeping well at night. Patient denies having any needs or concerns for nurse to follow up on  at the present time. Patient in agreement with nurse calling him later this month to schedule visit telephonic visit. Patient encouraged to contact palliative care with questions or concerns.     HISTORY OF PRESENT ILLNESS:  Patient is a 68 year old male who resides at home.  Patient continues to receive chemo.  Patient requests telephonic visits due to CoVid 19 pandemic.  Patient contacted monthly by PMPM team.     CODE STATUS: Full Code  ADVANCED DIRECTIVES: No MOST FORM: No PPS: 60%   PHYSICAL EXAM:   VITALS: Telephonic visit no vital signs taken  LUNGS: Shortness of breath on exertion CARDIAC:  EXTREMITIES: patient denies having any swelling in lower extremities SKIN: patient denies having any open areas of skin breakdown  NEURO: Alert and oriented x 4       Nilda Simmer, RN

## 2019-06-24 ENCOUNTER — Other Ambulatory Visit: Payer: Self-pay

## 2019-06-24 ENCOUNTER — Ambulatory Visit
Admission: RE | Admit: 2019-06-24 | Discharge: 2019-06-24 | Disposition: A | Discharge status: Home / Self Care | Payer: Medicare Other | Source: Ambulatory Visit | Attending: Oncology | Admitting: Oncology

## 2019-06-24 ENCOUNTER — Inpatient Hospital Stay: Payer: Medicare Other | Attending: Nurse Practitioner | Admitting: Oncology

## 2019-06-24 ENCOUNTER — Ambulatory Visit
Admission: RE | Admit: 2019-06-24 | Discharge: 2019-06-24 | Disposition: A | Discharge status: Home / Self Care | Payer: Medicare Other | Attending: Nurse Practitioner | Admitting: Nurse Practitioner

## 2019-06-24 DIAGNOSIS — R0600 Dyspnea, unspecified: Secondary | ICD-10-CM | POA: Diagnosis not present

## 2019-06-24 DIAGNOSIS — R059 Cough, unspecified: Secondary | ICD-10-CM

## 2019-06-24 DIAGNOSIS — R05 Cough: Secondary | ICD-10-CM

## 2019-06-24 DIAGNOSIS — C3431 Malignant neoplasm of lower lobe, right bronchus or lung: Secondary | ICD-10-CM | POA: Insufficient documentation

## 2019-06-24 DIAGNOSIS — I2699 Other pulmonary embolism without acute cor pulmonale: Secondary | ICD-10-CM | POA: Diagnosis not present

## 2019-06-24 DIAGNOSIS — Z7901 Long term (current) use of anticoagulants: Secondary | ICD-10-CM | POA: Diagnosis not present

## 2019-06-24 MED ORDER — PREDNISONE 10 MG (21) PO TBPK: ORAL_TABLET | ORAL | 0 refills | Status: DC

## 2019-06-24 MED ORDER — ALBUTEROL SULFATE HFA 108 (90 BASE) MCG/ACT IN AERS: 2.0000 | INHALATION_SPRAY | Freq: Four times a day (QID) | RESPIRATORY_TRACT | 1 refills | Status: DC | PRN

## 2019-06-24 NOTE — Progress Notes (Signed)
Symptom Management Consult note [facility]  [phone] [phone]  Patient Care Team: [name], NP as PCP - General (Nurse Practitioner) Telford Nab, RN as Registered Nurse Burlene Arnt, Gerda Diss, MD as Attending Physician (Emergency Medicine) Lebron Conners, [place] (Physician Assistant) Wonda Horner, MD as Referring Physician (Neurosurgery)   Name of the patient: Jimmy West  [id number]  [birth year]   Date of visit: 06/24/2019   Diagnosis- lung cancer   Chief complaint/ Reason for visit- cough   Heme/Onc history:  Oncology History  Primary cancer of right lower lobe of lung ([place])  04/26/2018 Initial Diagnosis   Primary cancer of right lower lobe of lung ([place])   05/24/2018 - 01/30/2019 Chemotherapy   The patient had pembrolizumab (KEYTRUDA) 200 mg in sodium chloride 0.9 % 50 mL chemo infusion, 200 mg, Intravenous, Once, 11 of 14 cycles Administration: 200 mg (05/24/2018), 200 mg (07/16/2018), 200 mg (08/09/2018), 200 mg (08/30/2018), 200 mg (09/20/2018), 200 mg (06/14/2018), 200 mg (10/11/2018), 200 mg (11/08/2018), 200 mg (11/29/2018), 200 mg (12/20/2018), 200 mg (01/10/2019)  for chemotherapy treatment.    02/08/2019 -  Chemotherapy   The patient had palonosetron (ALOXI) injection 0.25 mg, 0.25 mg, Intravenous,  Once, 6 of 10 cycles Administration: 0.25 mg (02/08/2019), 0.25 mg (03/29/2019), 0.25 mg (04/18/2019), 0.25 mg (02/28/2019), 0.25 mg (05/09/2019), 0.25 mg (05/30/2019) PEMEtrexed (ALIMTA) 900 mg in sodium chloride 0.9 % 100 mL chemo infusion, 500 mg/m2 = 900 mg, Intravenous,  Once, 6 of 10 cycles Administration: 900 mg (02/08/2019), 900 mg (03/29/2019), 900 mg (04/18/2019), 900 mg (05/09/2019), 900 mg (05/30/2019), 900 mg (02/28/2019) CARBOplatin (PARAPLATIN) 500 mg in sodium chloride 0.9 % 250 mL chemo infusion, 470 mg (100 % of original dose 472 mg), Intravenous,  Once, 2 of 2 cycles Dose modification:   (original dose 472 mg,  Cycle 1) Administration: 500 mg (02/08/2019), 190 mg (04/18/2019) pembrolizumab (KEYTRUDA) 200 mg in sodium chloride 0.9 % 50 mL chemo infusion, 200 mg, Intravenous, Once, 6 of 10 cycles Administration: 200 mg (02/08/2019), 200 mg (03/29/2019), 200 mg (04/18/2019), 200 mg (05/09/2019), 200 mg (05/30/2019), 200 mg (02/28/2019) fosaprepitant (EMEND) 150 mg, dexamethasone (DECADRON) 12 mg in sodium chloride 0.9 % 145 mL IVPB, , Intravenous,  Once, 4 of 4 cycles Administration:  (02/08/2019),  (04/18/2019)  for chemotherapy treatment.     Interval history-patient presents to symptom management today for complaints of a cough that began on Tuesday.  He was seen on Monday in clinic for consideration of cycle 7-day 1 Keytruda/Alimta.  Treatment was held secondary to C. difficile infection.  He will complete his course of vancomycin tomorrow.  On Tuesday, he developed an intermittent cough that has a "high-pitched sound" when he breathes in.  He has not tried any medications for his cough.  He denies any fevers.  Endorses gray/white sputum production.  Endorses shortness of breath with mild activity.  States it takes him 5 to 10 minutes to recover from any activity.  He denies any sick contacts.  He has not been working.  His appetite has remained stable and he denies any weight loss.  He denies any chest pain, nausea, vomiting, constipation or diarrhea.  He denies any urinary concerns.  ECOG FS:1 - Symptomatic but completely ambulatory  Review of systems- Review of Systems  Constitutional: Positive for malaise/fatigue. Negative for chills, fever and weight loss.  HENT: Negative for congestion, ear pain and tinnitus.   Eyes: Negative.  Negative for blurred vision and  double vision.  Respiratory: Positive for cough, sputum production, shortness of breath and wheezing.   Cardiovascular: Negative.  Negative for chest pain, palpitations and leg swelling.  Gastrointestinal: Negative.  Negative for abdominal pain,  constipation, diarrhea, nausea and vomiting.  Genitourinary: Negative for dysuria, frequency and urgency.  Musculoskeletal: Negative for back pain and falls.  Skin: Negative.  Negative for rash.  Neurological: Positive for weakness. Negative for headaches.  Endo/Heme/Allergies: Negative.  Does not bruise/bleed easily.  Psychiatric/Behavioral: Negative.  Negative for depression. The patient is not nervous/anxious and does not have insomnia.      Current treatment- s/p cycle 6 Keytruda/Alimta  No Known Allergies   Past Medical History:  Diagnosis Date  . Bulging lumbar disc   . Cancer ([place])    stage 4 lung cancer  . Heart attack [facility])      Past Surgical History:  Procedure Laterality Date  . CARDIAC CATHETERIZATION     two stents  . KNEE SURGERY Left   . PORTA CATH INSERTION N/A 05/21/2018   Procedure: PORTA CATH INSERTION;  Surgeon: Algernon Huxley, MD;  Location: [facility];  Service: Cardiovascular;  Laterality: N/A;    Social History   Socioeconomic History  . Marital status: Single    Spouse name: Not on file  . Number of children: Not on file  . Years of education: Not on file  . Highest education level: Not on file  Occupational History  . Not on file  Social Needs  . Financial resource strain: Not on file  . Food insecurity    Worry: Not on file    Inability: Not on file  . Transportation needs    Medical: Not on file    Non-medical: Not on file  Tobacco Use  . Smoking status: Former Smoker    Packs/day: 1.50    Years: 15.00    Pack years: 22.50    Types: Cigarettes    Quit date: 11/03/2003    Years since quitting: 15.6  . Smokeless tobacco: Never Used  Substance and Sexual Activity  . Alcohol use: Yes    Alcohol/week: 15.0 standard drinks    Types: 15 Shots of liquor per week  . Drug use: No  . Sexual activity: Not Currently  Lifestyle  . Physical activity    Days per week: Not on file    Minutes per session: Not on file  . Stress: Not  on file  Relationships  . Social Herbalist on phone: Not on file    Gets together: Not on file    Attends religious service: Not on file    Active member of club or organization: Not on file    Attends meetings of clubs or organizations: Not on file    Relationship status: Not on file  . Intimate partner violence    Fear of current or ex partner: Not on file    Emotionally abused: Not on file    Physically abused: Not on file    Forced sexual activity: Not on file  Other Topics Concern  . Not on file  Social History Narrative  . Not on file    Family History  Problem Relation Age of Onset  . Heart failure Father   . Cancer Maternal Aunt   . Heart failure Maternal Uncle   . Dementia Paternal Grandmother   . Cancer Maternal Aunt      Current Outpatient Medications:  .  acetaminophen (TYLENOL) 500 MG  tablet, Take 500 mg by mouth 3 (three) times daily as needed., Disp: , Rfl:  .  albuterol (VENTOLIN HFA) 108 (90 Base) MCG/ACT inhaler, Inhale 2 puffs into the lungs every 6 (six) hours as needed for wheezing or shortness of breath., Disp: 18 g, Rfl: 3 .  albuterol (VENTOLIN HFA) 108 (90 Base) MCG/ACT inhaler, Inhale 2 puffs into the lungs every 6 (six) hours as needed for wheezing or shortness of breath., Disp: 18 g, Rfl: 1 .  ALPRAZolam (XANAX) 0.5 MG tablet, Take 1 tablet (0.5 mg total) by mouth 3 (three) times daily as needed for anxiety., Disp: 60 tablet, Rfl: 0 .  apixaban (ELIQUIS) 5 MG TABS tablet, Take 2 tablets (10mg ) twice daily for 7 days, then 1 tablet (5mg ) twice daily, Disp: 60 tablet, Rfl: 0 .  aspirin EC 81 MG tablet, Take 81 mg by mouth daily., Disp: , Rfl:  .  Calcium 600-400 MG-UNIT CHEW, Chew 2 tablets by mouth daily., Disp: , Rfl:  .  dexamethasone (DECADRON) 4 MG tablet, Take 1 tab two times a day the day before Alimta chemo, then take 2 tabs once a day for 3 days starting the day after chemo., Disp: 30 tablet, Rfl: 1 .  fentaNYL (DURAGESIC) 75  MCG/HR, Place 1 patch onto the skin every 3 (three) days., Disp: 10 patch, Rfl: 0 .  FLUoxetine (PROZAC) 40 MG capsule, Take 1 capsule (40 mg total) by mouth daily., Disp: 30 capsule, Rfl: 3 .  folic acid (FOLVITE) 1 MG tablet, Take 1 tablet (1 mg total) by mouth daily. Start 5-7 days before Alimta chemotherapy. Continue until 21 days after Alimta completed., Disp: 100 tablet, Rfl: 3 .  ibuprofen (ADVIL,MOTRIN) 200 MG tablet, Take 200 mg by mouth every 6 (six) hours as needed., Disp: , Rfl:  .  ondansetron (ZOFRAN ODT) 4 MG disintegrating tablet, Take 1 tablet (4 mg total) by mouth every 6 (six) hours as needed for nausea or vomiting. (Patient not taking: Reported on 06/15/2019), Disp: 30 tablet, Rfl: 1 .  Oxycodone HCl 20 MG TABS, Take 0.5-1 tablets (10-20 mg total) by mouth every 4 (four) hours as needed (for severe pain)., Disp: 90 tablet, Rfl: 0 .  polyethylene glycol (MIRALAX / GLYCOLAX) packet, Take 17 g by mouth daily., Disp: , Rfl:  .  potassium chloride SA (KLOR-CON) 20 MEQ tablet, Take 1 tablet (20 mEq total) by mouth daily., Disp: 3 tablet, Rfl: 0 .  predniSONE (STERAPRED UNI-PAK 21 TAB) 10 MG (21) TBPK tablet, Take as instructed., Disp: 21 tablet, Rfl: 0 .  senna (SENOKOT) 8.6 MG tablet, Take 1 tablet by mouth as needed. , Disp: , Rfl:  .  vancomycin (VANCOCIN HCL) 125 MG capsule, Take 1 capsule (125 mg total) by mouth 4 (four) times daily., Disp: 28 capsule, Rfl: 0 No current facility-administered medications for this visit.   Facility-Administered Medications Ordered in Other Visits:  .  0.9 %  sodium chloride infusion, , Intravenous, Once, Corcoran, Melissa C, MD .  0.9 %  sodium chloride infusion, , Intravenous, Once PRN, Corcoran, Melissa C, MD .  albuterol (PROVENTIL) (2.5 MG/3ML) 0.083% nebulizer solution 2.5 mg, 2.5 mg, Nebulization, Once PRN, Corcoran, Melissa C, MD .  alteplase (CATHFLO ACTIVASE) injection 2 mg, 2 mg, Intracatheter, Once PRN, Corcoran, Melissa C, MD .   EPINEPHrine (ADRENALIN) 1 MG/10ML injection 0.25 mg, 0.25 mg, Intravenous, Once PRN, Corcoran, Melissa C, MD .  EPINEPHrine (ADRENALIN) 1 MG/10ML injection 0.25 mg, 0.25 mg, Intravenous, Once PRN, Mike Gip,  Melissa C, MD .  heparin lock flush 100 unit/mL, 500 Units, Intracatheter, Once PRN, Corcoran, Melissa C, MD .  heparin lock flush 100 unit/mL, 250 Units, Intracatheter, Once PRN, Corcoran, Melissa C, MD .  sodium chloride flush (NS) 0.9 % injection 10 mL, 10 mL, Intravenous, PRN, Mike Gip, Melissa C, MD, 10 mL at 03/04/19 0959 .  sodium chloride flush (NS) 0.9 % injection 10 mL, 10 mL, Intracatheter, Once PRN, Corcoran, Melissa C, MD .  sodium chloride flush (NS) 0.9 % injection 3 mL, 3 mL, Intracatheter, Once PRN, Lequita Asal, MD  Physical exam: There were no vitals filed for this visit. Physical Exam Constitutional:      Appearance: Normal appearance.  HENT:     Head: Normocephalic and atraumatic.  Eyes:     Pupils: Pupils are equal, round, and reactive to light.  Neck:     Musculoskeletal: Normal range of motion.  Cardiovascular:     Rate and Rhythm: Normal rate and regular rhythm.     Heart sounds: Normal heart sounds. No murmur.  Pulmonary:     Effort: Pulmonary effort is normal.     Breath sounds: Examination of the right-middle field reveals wheezing. Examination of the right-lower field reveals wheezing. Examination of the left-lower field reveals wheezing. Wheezing present.  Abdominal:     General: Bowel sounds are normal. There is no distension.     Palpations: Abdomen is soft.     Tenderness: There is no abdominal tenderness.  Musculoskeletal: Normal range of motion.  Skin:    General: Skin is warm and dry.     Findings: No rash.  Neurological:     Mental Status: He is alert and oriented to person, place, and time.  Psychiatric:        Judgment: Judgment normal.      CMP Latest Ref Rng & Units 06/20/2019  Glucose 70 - 99 mg/dL 110(H)  BUN 8 - 23 mg/dL  11  Creatinine 0.61 - 1.24 mg/dL 0.97  Sodium 135 - 145 mmol/L 135  Potassium 3.5 - 5.1 mmol/L 4.2  Chloride 98 - 111 mmol/L 101  CO2 22 - 32 mmol/L 23  Calcium 8.9 - 10.3 mg/dL 9.7  Total Protein 6.5 - 8.1 g/dL 7.4  Total Bilirubin 0.3 - 1.2 mg/dL 0.8  Alkaline Phos 38 - 126 U/L 68  AST 15 - 41 U/L 21  ALT 0 - 44 U/L 11   CBC Latest Ref Rng & Units 06/20/2019  WBC 4.0 - 10.5 K/uL 6.5  Hemoglobin 13.0 - 17.0 g/dL 9.4(L)  Hematocrit 39.0 - 52.0 % 29.2(L)  Platelets 150 - 400 K/uL 421(H)    No images are attached to the encounter.  Dg Chest 2 View  Result Date: 06/24/2019 CLINICAL DATA:  Cough for 5 days, dyspnea, stage IV lung cancer post radiation therapy, on chemotherapy, history MI EXAM: CHEST - 2 VIEW COMPARISON:  07/07/2018; correlation CT angio chest 04/01/2019 FINDINGS: RIGHT jugular Port-A-Cath with tip projecting over cavoatrial junction. Normal heart size. Minimally prominent central pulmonary arteries and RIGHT hilum. Emphysematous and bronchitic changes consistent with COPD. RIGHT infrahilar mass again seen with streaky atelectasis and significant peribronchial thickening in RIGHT lower lobe. Remaining lungs clear. No pleural effusion or pneumothorax. 9 mm RIGHT upper lobe nodule at corresponding to an ovoid peripheral density on prior CT. No acute osseous findings. IMPRESSION: Persistent RIGHT infrahilar mass with streaky atelectasis and peribronchial thickening and RIGHT lower lobe. Stable 9 mm RIGHT upper lobe nodule. No additional  abnormalities. Electronically Signed   By: Lavonia Dana M.D.   On: 06/24/2019 14:26   Ct Angio Chest Pe W Or Wo Contrast  Result Date: 05/30/2019 CLINICAL DATA:  Lung cancer, intermittent shortness of breath and left-sided chest discomfort with elevated D-dimer. EXAM: CT ANGIOGRAPHY CHEST WITH CONTRAST TECHNIQUE: Multidetector CT imaging of the chest was performed using the standard protocol during bolus administration of intravenous contrast.  Multiplanar CT image reconstructions and MIPs were obtained to evaluate the vascular anatomy. CONTRAST:  28mL OMNIPAQUE IOHEXOL 350 MG/ML SOLN COMPARISON:  05/06/2019 FINDINGS: Cardiovascular: Tiny subsegmental pulmonary embolus in the right lower lobe. (Image 202, series 6). Heart size is mildly enlarged, no signs of pericardial effusion. Signs of prior myocardial infarction with apical ventricular thinning and dilation with evidence of prior percutaneous coronary intervention as seen on previous exam. Aorta is not well opacified. Contour is normal and caliber is normal in the setting of scattered atherosclerotic changes. Right IJ Port-A-Cath terminates at the caval to atrial junction. Mediastinum/Nodes: No signs of mediastinal lymph adenopathy. Stable appearance of right infrahilar nodal enlargement that is mild and better assessed on previous study. No supraclavicular adenopathy or axillary adenopathy. Lungs/Pleura: Signs of pulmonary emphysema with moderate to marked changes, worse at the lung apex. Stable nodular opacity in the right mid chest along the pleural surface (image 51, series 7) Dominant nodule in the right lung base measures 2.6 x 2.2 cm (image 87, series 7) previously 2.8 x 3.1 cm. Areas of linear opacity extend to the pleural surface from this nodule in the superior segment of the right lower lobe. Areas of ground-glass at the periphery of the right chest have have become more conspicuous since the previous exam. No signs of pleural effusion. No dense consolidation. Minimal atelectasis at the left lung base with small amounts of material in left lower lobe bronchi. Bronchial structures otherwise patent. Upper Abdomen: No acute findings in the upper abdomen. Musculoskeletal: Signs of T4 compression and sclerosis. Essentially with vertebra plana unchanged from previous study with retropulsion of sclerotic posterior vertebral body, unchanged. Review of the MIP images confirms the above findings.  IMPRESSION: 1. Tiny subsegmental pulmonary embolus in the right lower lobe. 2. Areas of ground-glass at the periphery of the right chest have become more conspicuous since the previous exam. May relate to mild pneumonitis, perhaps due to radiation, attention on follow-up. 3. Dominant nodule in the right lung base is stable in size. 4. Atherosclerosis with signs of prior myocardial infarction and percutaneous coronary intervention. Aortic Atherosclerosis (ICD10-I70.0) and Emphysema (ICD10-J43.9). Electronically Signed   By: Zetta Bills M.D.   On: 05/30/2019 14:17   Assessment and plan- Patient is a 68 y.o. male who presents to symptom management for complaints of a cough x4 days.  He admits to frothy white/gray-like sputum.  He denies any fevers.  Metastatic lung cancer: Currently status post 6 cycles of keyruda/alimta.  Previous treatments were discontinued secondary to intolerance/nausea and vomiting.  Currently on Xgeva for bony metastasis and T4 pathological fracture.  Scheduled to return to clinic on Monday, 06/27/2023 reconsideration of cycle 7 of Keytruda/Alimta.  Cough: Unclear. Will get STAT imaging.  Denies a fever.  Sputum production is gray/white frothy.  Assessment reveals significant wheezing in right middle and lower lobe and left lower lobe.  Plan: Vitals. Stable.  Oxygen saturations 97%.  Vital signs stable. Stat Chest x-ray.  Results revealed emphysema and bronchitic changes consistent with COPD but no acute abnormality. Rx prednisone taper.  Rx albuterol inhaler  2 puffs every 6 hours as needed for wheezing. Recommend he begin Mucinex. Continue vancomycin.  Disposition: RTC as scheduled on Monday, 06/27/2019 for labs, MD assessment and consideration of cycle 7 Keytruda/Alimta.   Visit Diagnosis 1. Dyspnea, unspecified type   2. Cough     Patient expressed understanding and was in agreement with this plan. He also understands that He can call clinic at any time with any  questions, concerns, or complaints.   Greater than 50% was spent in counseling and coordination of care with this patient including but not limited to discussion of the relevant topics above (See A&P) including, but not limited to diagnosis and management of acute and chronic medical conditions.   Thank you for allowing me to participate in the care of this very pleasant patient.    Jacquelin Hawking, NP [place] [facility] Cell - [phone] Pager- [phone] 06/24/2019 2:35 PM  CC: Dr. Mike Gip

## 2019-06-25 NOTE — Progress Notes (Signed)
[facility]  [address], [address] [place], [place] [address] Phone: [phone]  Fax: [phone]   Clinic Day:  06/27/2019  Referring physician: [name], NP  Chief Complaint: Jimmy West is a 68 y.o. male with metastatic adenocarcinoma of the lung who is seen assessment prior to cycle #5 Alimta and pembrolizumab.  HPI: The patient was last seen in the medical oncology clinic on 06/20/2019.  At that time, he continued oral vancomycin for C difficile + diarrhea.  Diarrhea had resolved.  Exam was unremarkable.  Decision was made to postpone treatment until after antibiotics were complete.  He received Xgeva.  He was connected with Home health palliative care by Nilda Simmer, RN via telephone per patients request on 06/21/2019.  He was doing well at that time.   He was seen for dyspnea by Faythe Casa, NP on 06/24/2019. CXR showed a persistent right infrahilar mass with streaky atelectasis and peribronchial thickening and right lower lobe. There was a stable 9 mm right upper lobe nodule with no additional abnormalities.  He was to continue vancomycin (for C diff) and continue inhaler 2 puff ever 6 hours PRN.   During the interim, he is unsure when he finished his vancomycin. His diarrhea has resolved. He got a cough on 06/21/2019; he felt as if something was stuck in his throat and he couldn't get it out. His cough got worse with exertion.  He was given prednisone and started it on Saturday. He is on taper schedule and cough has gotten better.   Symptomatically, he is doing " just ok". He took his Decadron pre-med. He just turned in his forms for financial support for his Eliquis this morning and only has 1 pill left.    Past Medical History:  Diagnosis Date  . Bulging lumbar disc   . Cancer ([place])    stage 4 lung cancer  . Heart attack [facility])     Past Surgical History:  Procedure Laterality Date  . CARDIAC CATHETERIZATION     two  stents  . KNEE SURGERY Left   . PORTA CATH INSERTION N/A 05/21/2018   Procedure: PORTA CATH INSERTION;  Surgeon: Algernon Huxley, MD;  Location: [facility];  Service: Cardiovascular;  Laterality: N/A;    Family History  Problem Relation Age of Onset  . Heart failure Father   . Cancer Maternal Aunt   . Heart failure Maternal Uncle   . Dementia Paternal Grandmother   . Cancer Maternal Aunt     Social History:  reports that he quit smoking about 15 years ago. His smoking use included cigarettes. He has a 22.50 pack-year smoking history. He has never used smokeless tobacco. He reports current alcohol use of about 15.0 standard drinks of alcohol per week. He reports that he does not use drugs. He started smoking at age 31. He is smoking 1 1/2 packs/day. Patient denies known exposures to radiation ortoxins. 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 He has not been working recently as the salon was closed. He plays golf occasionally.He has 8 cats The patient is alone today.  Allergies: No Known Allergies  Current Medications: Current Outpatient Medications  Medication Sig Dispense Refill  . acetaminophen (TYLENOL) 500 MG tablet Take 500 mg by mouth 3 (three) times daily as needed.    Marland Kitchen albuterol (VENTOLIN HFA) 108 (90 Base) MCG/ACT inhaler Inhale 2 puffs into the lungs every 6 (six) hours as needed for wheezing  or shortness of breath. 18 g 3  . ALPRAZolam (XANAX) 0.5 MG tablet Take 1 tablet (0.5 mg total) by mouth 3 (three) times daily as needed for anxiety. 60 tablet 0  . apixaban (ELIQUIS) 5 MG TABS tablet Take 2 tablets (52m) twice daily for 7 days, then 1 tablet (531m twice daily 60 tablet 0  . Calcium 600-400 MG-UNIT CHEW Chew 2 tablets by mouth daily.    . Marland Kitchenexamethasone (DECADRON) 4 MG tablet Take 1 tab two times a day the day before Alimta chemo, then take 2 tabs once a day for 3 days starting the day after chemo. 30 tablet 1  . fentaNYL (DURAGESIC) 75  MCG/HR Place 1 patch onto the skin every 3 (three) days. 10 patch 0  . FLUoxetine (PROZAC) 40 MG capsule Take 1 capsule (40 mg total) by mouth daily. 30 capsule 3  . folic acid (FOLVITE) 1 MG tablet Take 1 tablet (1 mg total) by mouth daily. Start 5-7 days before Alimta chemotherapy. Continue until 21 days after Alimta completed. 100 tablet 3  . ibuprofen (ADVIL,MOTRIN) 200 MG tablet Take 200 mg by mouth every 6 (six) hours as needed.    . Oxycodone HCl 20 MG TABS Take 0.5-1 tablets (10-20 mg total) by mouth every 4 (four) hours as needed (for severe pain). 90 tablet 0  . polyethylene glycol (MIRALAX / GLYCOLAX) packet Take 17 g by mouth daily.    . predniSONE (STERAPRED UNI-PAK 21 TAB) 10 MG (21) TBPK tablet Take as instructed. 21 tablet 0  . senna (SENOKOT) 8.6 MG tablet Take 1 tablet by mouth as needed.     . Marland Kitchenspirin EC 81 MG tablet Take 81 mg by mouth daily.    . ondansetron (ZOFRAN ODT) 4 MG disintegrating tablet Take 1 tablet (4 mg total) by mouth every 6 (six) hours as needed for nausea or vomiting. (Patient not taking: Reported on 06/15/2019) 30 tablet 1  . potassium chloride SA (KLOR-CON) 20 MEQ tablet Take 1 tablet (20 mEq total) by mouth daily. (Patient not taking: Reported on 06/27/2019) 3 tablet 0   No current facility-administered medications for this visit.    Facility-Administered Medications Ordered in Other Visits  Medication Dose Route Frequency Provider Last Rate Last Dose  . 0.9 %  sodium chloride infusion   Intravenous Once ,  C, MD      . 0.9 %  sodium chloride infusion   Intravenous Once PRN ,  C, MD      . albuterol (PROVENTIL) (2.5 MG/3ML) 0.083% nebulizer solution 2.5 mg  2.5 mg Nebulization Once PRN ,  C, MD      . alteplase (CATHFLO ACTIVASE) injection 2 mg  2 mg Intracatheter Once PRN CoLequita AsalMD      . EPINEPHrine (ADRENALIN) 1 MG/10ML injection 0.25 mg  0.25 mg Intravenous Once PRN ,  C, MD       . EPINEPHrine (ADRENALIN) 1 MG/10ML injection 0.25 mg  0.25 mg Intravenous Once PRN ,  C, MD      . heparin lock flush 100 unit/mL  500 Units Intracatheter Once PRN CoNolon Stalls, MD      . heparin lock flush 100 unit/mL  250 Units Intracatheter Once PRN CoNolon Stalls, MD      . heparin lock flush 100 unit/mL  500 Units Intravenous Once ,  C, MD      . sodium chloride flush (NS) 0.9 % injection 10 mL  10 mL Intravenous PRN ,  Drue Second, MD   10 mL at 03/04/19 0959  . sodium chloride flush (NS) 0.9 % injection 10 mL  10 mL Intracatheter Once PRN Nolon Stalls C, MD      . sodium chloride flush (NS) 0.9 % injection 10 mL  10 mL Intravenous PRN Lequita Asal, MD   10 mL at 06/27/19 0904  . sodium chloride flush (NS) 0.9 % injection 3 mL  3 mL Intracatheter Once PRN Lequita Asal, MD        Review of Systems  Constitutional: Positive for malaise/fatigue (x 3-4 days fair). Negative for chills, diaphoresis, fever and weight loss (1 lb).       Feels "ok".  HENT: Negative.  Negative for congestion, ear pain, nosebleeds, sinus pain and sore throat.   Eyes: Negative.  Negative for blurred vision and double vision.  Respiratory: Positive for cough (chronic). Negative for hemoptysis, sputum production, shortness of breath (improving) and wheezing.   Cardiovascular: Negative.  Negative for chest pain, palpitations, orthopnea, leg swelling and PND.  Gastrointestinal: Positive for diarrhea (resolved s/p vancomycin). Negative for abdominal pain, blood in stool, constipation, melena, nausea and vomiting.       Eating well.  Genitourinary: Negative.  Negative for dysuria, flank pain, frequency, hematuria and urgency.  Musculoskeletal: Negative.  Negative for back pain, falls, joint pain, myalgias and neck pain.  Skin: Negative.  Negative for rash.  Neurological: Negative.  Negative for dizziness, tingling, tremors, sensory change, speech change,  focal weakness, weakness and headaches.  Endo/Heme/Allergies: Negative.  Negative for environmental allergies. Does not bruise/bleed easily.  Psychiatric/Behavioral: Negative.  Negative for depression and memory loss. The patient is not nervous/anxious (chronic on Xanax) and does not have insomnia.   All other systems reviewed and are negative.  Performance status (ECOG): 1 - Symptomatic but completely ambulatory  Vitals Blood pressure 132/79, pulse 69, temperature 98 F (36.7 C), temperature source Oral, resp. rate 18, weight 146 lb 4.4 oz (66.3 kg), SpO2 99 %.   Physical Exam  Constitutional: He is oriented to person, place, and time. He appears well-developed and well-nourished. No distress.  HENT:  Head: Normocephalic and atraumatic.  Mouth/Throat: Oropharynx is clear and moist. No oropharyngeal exudate.  Thin brown graying hair.  Mustache.  Dentures.  Mask.  Eyes: Pupils are equal, round, and reactive to light. Conjunctivae and EOM are normal. No scleral icterus.  Glasses.  Gray blue eyes.  Neck: Normal range of motion. Neck supple. No JVD present.  Cardiovascular: Normal rate, regular rhythm and normal heart sounds. Exam reveals no gallop and no friction rub.  No murmur heard. Pulmonary/Chest: Effort normal and breath sounds normal. No respiratory distress. He has no wheezes. He has no rales.  atelectasis on shallow breathes  Abdominal: Soft. Bowel sounds are normal. He exhibits no distension and no mass. There is no abdominal tenderness. There is no rebound and no guarding.  Musculoskeletal:        General: No tenderness or edema. Normal range of motion.     Cervical back: Normal range of motion and neck supple.  Lymphadenopathy:       Head (right side): No preauricular, no posterior auricular and no occipital adenopathy present.       Head (left side): No preauricular, no posterior auricular and no occipital adenopathy present.    He has no cervical adenopathy.    He has no  axillary adenopathy.       Right: No inguinal and no supraclavicular adenopathy present.  Left: No inguinal and no supraclavicular adenopathy present.  Neurological: He is alert and oriented to person, place, and time.  Skin: Skin is warm and dry. No rash noted. He is not diaphoretic. No erythema. No pallor.  Psychiatric: He has a normal mood and affect. His behavior is normal. Judgment and thought content normal.  Nursing note and vitals reviewed.   Infusion on 06/27/2019  Component Date Value Ref Range Status  . WBC 06/27/2019 7.7  4.0 - 10.5 K/uL Final  . RBC 06/27/2019 3.35* 4.22 - 5.81 MIL/uL Final  . Hemoglobin 06/27/2019 9.9* 13.0 - 17.0 g/dL Final  . HCT 06/27/2019 31.0* 39.0 - 52.0 % Final  . MCV 06/27/2019 92.5  80.0 - 100.0 fL Final  . MCH 06/27/2019 29.6  26.0 - 34.0 pg Final  . MCHC 06/27/2019 31.9  30.0 - 36.0 g/dL Final  . RDW 06/27/2019 15.9* 11.5 - 15.5 % Final  . Platelets 06/27/2019 258  150 - 400 K/uL Final  . nRBC 06/27/2019 0.0  0.0 - 0.2 % Final  . Neutrophils Relative % 06/27/2019 83  % Final  . Neutro Abs 06/27/2019 6.5  1.7 - 7.7 K/uL Final  . Lymphocytes Relative 06/27/2019 5  % Final  . Lymphs Abs 06/27/2019 0.4* 0.7 - 4.0 K/uL Final  . Monocytes Relative 06/27/2019 11  % Final  . Monocytes Absolute 06/27/2019 0.8  0.1 - 1.0 K/uL Final  . Eosinophils Relative 06/27/2019 0  % Final  . Eosinophils Absolute 06/27/2019 0.0  0.0 - 0.5 K/uL Final  . Basophils Relative 06/27/2019 0  % Final  . Basophils Absolute 06/27/2019 0.0  0.0 - 0.1 K/uL Final  . Immature Granulocytes 06/27/2019 1  % Final  . Abs Immature Granulocytes 06/27/2019 0.08* 0.00 - 0.07 K/uL Final   Performed at [facility], [address] [place], [place] [address]  . Sodium 06/27/2019 135  135 - 145 mmol/L Final  . Potassium 06/27/2019 4.2  3.5 - 5.1 mmol/L Final  . Chloride 06/27/2019 103  98 - 111 mmol/L Final  . CO2 06/27/2019 21* 22 - 32 mmol/L Final  . Glucose, Bld  06/27/2019 129* 70 - 99 mg/dL Final  . BUN 06/27/2019 23  8 - 23 mg/dL Final  . Creatinine, Ser 06/27/2019 1.12  0.61 - 1.24 mg/dL Final  . Calcium 06/27/2019 9.2  8.9 - 10.3 mg/dL Final  . Total Protein 06/27/2019 7.3  6.5 - 8.1 g/dL Final  . Albumin 06/27/2019 4.0  3.5 - 5.0 g/dL Final  . AST 06/27/2019 22  15 - 41 U/L Final  . ALT 06/27/2019 11  0 - 44 U/L Final  . Alkaline Phosphatase 06/27/2019 74  38 - 126 U/L Final  . Total Bilirubin 06/27/2019 0.6  0.3 - 1.2 mg/dL Final  . GFR calc non Af Amer 06/27/2019 >60  >60 mL/min Final  . GFR calc Af Amer 06/27/2019 >60  >60 mL/min Final  . Anion gap 06/27/2019 11  5 - 15 Final   Performed at [facility], [address] [place], [place] [address]  . Magnesium 06/27/2019 2.5* 1.7 - 2.4 mg/dL Final   Performed at [facility], [address], [place], [place] [address]    Assessment:  Reg Bircher is a 68 y.o. male with metastatichigh-grade adenocarcinomaof the right lungs/p CT-guided biopsy of a RLL lung mass on 05/11/2018. Pathologyrevealed an invasive high-grade adenocarcinoma, with predominantly solid growth pattern. The neoplastic cells were TTF-1 (+), Napsin  A (+), and P40 (+). He has a T4 vertebral metastasis. Clinical stage is T4N1M1.  There was not enough material for Foundation One testing. PDL-1revealed TPS 90%.  PET scanon 04/30/2018 revealed a 3.4 cm hypermetabolic RLL pulmonary mass (SUV 13.2), 11 mm pulmonary nodule in the LEFT lung (SUV 4.5), RIGHT paratracheal lymph node (SUV 5.1), and hypermetabolic activity within the T4 vertebral body (SUV 10.2).   Thoracic spine MRIon 05/03/2018 revealed T4 metastasiswith a 40% pathologic compression deformity and 5 mm of retropulsion of the vertebral body. Retropulsion results in mild spinal canal stenosis and mild bilateral C4-5 foraminal stenosis. There was paravertebral soft tissue thickening from mid T3 to mid T5, likely representing  edema related to the pathologic compression deformity vs. possible extraosseous extension of the neoplasm. There were no additional thoracic spinal metastases noted.   Head MRIon 05/03/2018 revealed no intracranial metastatic disease. 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 Incidental mention made of mild paranasal sinus disease.  Hereceived 11cycles ofpembrolizumab(05/24/2018 - 02/07/2019). He toleratedtreatment well. CEAwas 1.3 on 08/30/2018.LDHwas 165 on 11/29/2018.  He completed T4 radiationon 07/07/2018. He receives Xgevamonthly (06/03/2018 -05/16/2019).  He received 1 cycle of carboplatin, Alimta, and pembrolizumabon 02/08/2019. Cycle #1 was complicated by nausea, vomiting, and diarrhea necessitating hospitalization.Decision made to hold carboplatin with his second dose.He iss/p2cycles ofAlimta and pembrolizumab (02/28/2019 - 03/29/2019).Cycle #2 was complicated by nausea, vomiting, and dehydration requring fluids in clinic and an overnight stay in the hospital. Hereceivedcarboplatin (AUC 2), Alimta, and pebrolizumab(04/18/2019).He tolerated it poorly. He received Alimta and pembrolizumabon 05/09/2019 (last 05/30/2019). He tolerated well. He receives B12every 9 weeks (last 05/25/2019).  He hascancer-related painin T4. He is currently taking Fentanyl75 mcg/hr and oxycodone10 mg every 4 hours prn.  Cervical and thoracic spine CT at [facility] 01/10/2019 revealed unchanged severe compression deformity/vertebral plana of T4 with a stable degree of retropulsion and focal mild spinal canal stenosis. There was interval enlargement of a right lower lobe pulmonary mass(4.5 x 3.5 cm compared to 3.9 x 2.9 cm)with redemonstrated spiculated pulmonary nodules.  Chest, abdomen,  pelvisCTon 01/28/2019 revealed slight interval enlargement of a right lower lobe mass with central necrosis measuring 4.6 x 3.4 cm, previously 4.0 x 3.0 cm. There was no change in right upper lobe nodules(1.4 and 0.8 cm).Therewas almost no residua of left lung nodules, with irregular opacities in the apical left upper lobeandsuperior segment left lower lobe. There was no change in right hilar soft tissue and lymph nodes.There wasvertebra plana deformity of T4andno evidence of new osseous metastatic disease. No evidence of distant metastatic disease in the abdomen or pelvis.The1.6 x 1.1 cmleft adrenal nodule(non-metabolic on prior PET scan),was unchanged.  Abdomen and pelvis CTon 04/23/2019 revealed no acute abdominal or pelvic pathology. There was fluid in the colon which can be seen with diarrhea. There was diverticulosis without evidence of diverticulitis. There was a stable 1.5 cm left adrenal nodule.  Chest, abdomen, and pelvisCTon 10/16/2020revealed a positive response to interval therapy for the known right lower lobe lung carcinoma(4.6 x 3.4 cm to3.1 x 2.8 cm transversely). There hadbeen a mild interval decrease contiguous soft tissue that extends along the right hilum. The two right upper lobe noduleswerestable from the most recent prior study (smaller than 04/27/2018).There are no new lung nodules.Therewasno evidence of new metastatic disease.There was stable severe compression deformity/vertebra plana of T4.Therewasno evidence of other osseous metastatic disease.There  was a stable left adrenal nodule consistent with an adenoma.  Chest CT angiogram on 05/30/2019 revealed a tiny subsegmental pulmonary embolus in the right lower lobe.  There areas of ground-glass at the periphery of the right chest had become more conspicuous. This may relate to mild pneumonitis, perhaps due to radiation, attention on follow-up. The dominant nodule in the right lung base was  stable in size. He is on Eliquis.  Hehad transient renal insufficiencyon 03/23/2019. Creatinine was 1.32 (baseline 0.61-1.0). Renal ultrasoundon 03/24/2019 revealed no acute abnormality identified.There was no hydronephrosis or bladder distention.Creatinine is 0.82 today.  Stool was positive for C difficile + diarrhea on 06/14/2019.  He was treated with oral vancomycin beginning 06/17/2019.  He received the influenza vaccineon 05/16/2019.  Symptomatically, his diarrhea has resolved.  He has had a cough, improving on steroids (started 06/21/2019).  Plan: 1.  Labs today: CBC with diff, CMP, Mg. 2. Metastatic high-grade adenocarcinoma the RIGHT lung He is s/p 11 cycles of pembrolizumab. He is s/p 1 cycle of carboplatin, Almita, and pembrolizumab (02/08/2019). He is s/p 1 cycle of carboplatin (AUC 2), Alimta, and pembrolizumab (04/18/2019). He is s/p 4 cycles of Alimta and pembrolizumab (02/28/2019 - 03/29/2019; 05/09/2019 - 05/30/2019). He is tolerating treatment without carboplatin better. Labs reviewed. Begin cycle #5 Alimta and pembrolizumab.             Patient took Decadron.  He has been on a steroid taper (discontinue after today).             Continue B12 every 9 weeks (last 05/25/2019).             Continue folic acid.  Discuss symptom management.  He has antiemetics and pain medications at home to use on a prn bases.  Interventions are adequate.    3. Nausea and vomiting post chemotherapy He has struggled withchemotherapy includingcarboplatin. He has received IVF post chemotherapy.             RTC daily this week for +/- IVF and +/- anti-emetics. 4. Bone metastasis Reschedule patient's consult with [place] neurosurgery, Dr Izora Ribas. Last Xgeva on11/30/2020. Continue to monitor. 5. Cancer related pain Painremains well controlled. Continue Fentanyl 75 mcg patch  and oxycodone 10 mg p.o. every 4 hours as needed pain. 6. Anxiety Continue low dose Xanax. 7.Pulmonary embolism             Chest CT angiogram on 05/30/2019 confirmed a subsegmental pulmonary embolism.             Patient initially confused on how to take Eliquis.             Rx: Eliquis 5 mg BID (dis: #60).   Financial forms completed today. 8.Normocytic anemia Hematocrit 31.0.  Hemoglobin 9.9.  MCV 92.5 today.                         Ferritin 687 with an iron saturation of 26% and a TIBC of 335 on 05/30/2019.             Etiology felt secondary to chemotherapy induced anemia. Anticipate future initiation of Retacrit every 2 weeks per protocol. 9.C diff + diarrhea             Patient completed a course of oral vancomycin.             Diarrhea has resolved.             Continue to monitor. 10.  RN to contact Nuala Alpha, pharmacist re: assistance with Eliquis. 11.   RTC daily this week for +/- IVF and +/- anti-emetics. 12.   RTC in 3 weeks for MD assessment, labs (CBC withdiff, CMP, Mg, TSH, free T4), Xgeva, and cycle #6Alimta and pembrolizumab.  I discussed the assessment and treatment plan with the patient.  The patient was provided an opportunity to ask questions and all were answered.  The patient agreed with the plan and demonstrated an understanding of the instructions.  The patient was advised to call back if the symptoms worsen or if the condition fails to improve as anticipated.  I provided 21 minutes (09:50 AM - 10:10 AM) of face-to-face time during this this encounter and > 50% was spent counseling as documented under my assessment and plan.  Additional time spent today discussing with oral and IV pharmacists.   Lequita Asal, MD, PhD    06/27/2019, 10:10 AM  I, Samul Dada, am acting as a scribe for Lequita Asal, MD.  I, Paris Mike Gip, MD, have reviewed the above documentation for accuracy and  completeness, and I agree with the above.

## 2019-06-26 DIAGNOSIS — A0472 Enterocolitis due to Clostridium difficile, not specified as recurrent: Secondary | ICD-10-CM | POA: Insufficient documentation

## 2019-06-27 ENCOUNTER — Inpatient Hospital Stay: Payer: Medicare Other

## 2019-06-27 ENCOUNTER — Other Ambulatory Visit: Payer: Self-pay

## 2019-06-27 ENCOUNTER — Encounter: Payer: Self-pay | Admitting: Hematology and Oncology

## 2019-06-27 ENCOUNTER — Telehealth: Payer: Self-pay | Admitting: Pharmacy Technician

## 2019-06-27 ENCOUNTER — Inpatient Hospital Stay: Payer: Medicare Other | Attending: Hematology and Oncology | Admitting: Hematology and Oncology

## 2019-06-27 VITALS — BP 132/79 | HR 69 | Temp 98.0°F | Resp 18 | Wt 146.3 lb

## 2019-06-27 VITALS — BP 124/70 | HR 53 | Temp 97.6°F | Resp 18

## 2019-06-27 DIAGNOSIS — T451X5A Adverse effect of antineoplastic and immunosuppressive drugs, initial encounter: Secondary | ICD-10-CM | POA: Insufficient documentation

## 2019-06-27 DIAGNOSIS — Z79899 Other long term (current) drug therapy: Secondary | ICD-10-CM | POA: Diagnosis not present

## 2019-06-27 DIAGNOSIS — Z7901 Long term (current) use of anticoagulants: Secondary | ICD-10-CM | POA: Insufficient documentation

## 2019-06-27 DIAGNOSIS — G893 Neoplasm related pain (acute) (chronic): Secondary | ICD-10-CM

## 2019-06-27 DIAGNOSIS — C7951 Secondary malignant neoplasm of bone: Secondary | ICD-10-CM | POA: Diagnosis not present

## 2019-06-27 DIAGNOSIS — I2699 Other pulmonary embolism without acute cor pulmonale: Secondary | ICD-10-CM

## 2019-06-27 DIAGNOSIS — D6481 Anemia due to antineoplastic chemotherapy: Secondary | ICD-10-CM | POA: Diagnosis not present

## 2019-06-27 DIAGNOSIS — Z5111 Encounter for antineoplastic chemotherapy: Secondary | ICD-10-CM

## 2019-06-27 DIAGNOSIS — A0472 Enterocolitis due to Clostridium difficile, not specified as recurrent: Secondary | ICD-10-CM

## 2019-06-27 DIAGNOSIS — F419 Anxiety disorder, unspecified: Secondary | ICD-10-CM | POA: Diagnosis not present

## 2019-06-27 DIAGNOSIS — Z5112 Encounter for antineoplastic immunotherapy: Secondary | ICD-10-CM

## 2019-06-27 DIAGNOSIS — C3431 Malignant neoplasm of lower lobe, right bronchus or lung: Secondary | ICD-10-CM

## 2019-06-27 LAB — CBC WITH DIFFERENTIAL/PLATELET
Abs Immature Granulocytes: 0.08 10*3/uL — ABNORMAL HIGH (ref 0.00–0.07)
Basophils Absolute: 0 10*3/uL (ref 0.0–0.1)
Basophils Relative: 0 %
Eosinophils Absolute: 0 10*3/uL (ref 0.0–0.5)
Eosinophils Relative: 0 %
HCT: 31 % — ABNORMAL LOW (ref 39.0–52.0)
Hemoglobin: 9.9 g/dL — ABNORMAL LOW (ref 13.0–17.0)
Immature Granulocytes: 1 %
Lymphocytes Relative: 5 %
Lymphs Abs: 0.4 10*3/uL — ABNORMAL LOW (ref 0.7–4.0)
MCH: 29.6 pg (ref 26.0–34.0)
MCHC: 31.9 g/dL (ref 30.0–36.0)
MCV: 92.5 fL (ref 80.0–100.0)
Monocytes Absolute: 0.8 10*3/uL (ref 0.1–1.0)
Monocytes Relative: 11 %
Neutro Abs: 6.5 10*3/uL (ref 1.7–7.7)
Neutrophils Relative %: 83 %
Platelets: 258 10*3/uL (ref 150–400)
RBC: 3.35 MIL/uL — ABNORMAL LOW (ref 4.22–5.81)
RDW: 15.9 % — ABNORMAL HIGH (ref 11.5–15.5)
WBC: 7.7 10*3/uL (ref 4.0–10.5)
nRBC: 0 % (ref 0.0–0.2)

## 2019-06-27 LAB — COMPREHENSIVE METABOLIC PANEL
ALT: 11 U/L (ref 0–44)
AST: 22 U/L (ref 15–41)
Albumin: 4 g/dL (ref 3.5–5.0)
Alkaline Phosphatase: 74 U/L (ref 38–126)
Anion gap: 11 (ref 5–15)
BUN: 23 mg/dL (ref 8–23)
CO2: 21 mmol/L — ABNORMAL LOW (ref 22–32)
Calcium: 9.2 mg/dL (ref 8.9–10.3)
Chloride: 103 mmol/L (ref 98–111)
Creatinine, Ser: 1.12 mg/dL (ref 0.61–1.24)
GFR calc Af Amer: >60 mL/min (ref >60)
GFR calc non Af Amer: >60 mL/min (ref >60)
Glucose, Bld: 129 mg/dL — ABNORMAL HIGH (ref 70–99)
Potassium: 4.2 mmol/L (ref 3.5–5.1)
Sodium: 135 mmol/L (ref 135–145)
Total Bilirubin: 0.6 mg/dL (ref 0.3–1.2)
Total Protein: 7.3 g/dL (ref 6.5–8.1)

## 2019-06-27 LAB — MAGNESIUM: Magnesium: 2.5 mg/dL — ABNORMAL HIGH (ref 1.7–2.4)

## 2019-06-27 MED ORDER — PALONOSETRON HCL INJECTION 0.25 MG/5ML
0.2500 mg | Freq: Once | INTRAVENOUS | Status: AC
  Administered 2019-06-27: 0.25 mg via INTRAVENOUS

## 2019-06-27 MED ORDER — SODIUM CHLORIDE 0.9 % IV SOLN
Freq: Once | INTRAVENOUS | Status: AC
  Administered 2019-06-27: 10:00:00 via INTRAVENOUS
  Filled 2019-06-27: qty 250

## 2019-06-27 MED ORDER — APIXABAN 5 MG PO TABS: ORAL_TABLET | ORAL | 0 refills | Status: DC

## 2019-06-27 MED ORDER — SODIUM CHLORIDE 0.9 % IV SOLN
200.0000 mg | Freq: Once | INTRAVENOUS | Status: AC
  Administered 2019-06-27: 200 mg via INTRAVENOUS
  Filled 2019-06-27: qty 8

## 2019-06-27 MED ORDER — SODIUM CHLORIDE 0.9 % IV SOLN
500.0000 mg/m2 | Freq: Once | INTRAVENOUS | Status: AC
  Administered 2019-06-27: 900 mg via INTRAVENOUS
  Filled 2019-06-27: qty 20

## 2019-06-27 MED ORDER — HEPARIN SOD (PORK) LOCK FLUSH 100 UNIT/ML IV SOLN
500.0000 [IU] | Freq: Once | INTRAVENOUS | Status: AC
  Administered 2019-06-27: 500 [IU] via INTRAVENOUS
  Filled 2019-06-27: qty 5

## 2019-06-27 MED ORDER — SODIUM CHLORIDE 0.9% FLUSH
10.0000 mL | INTRAVENOUS | Status: DC | PRN
  Administered 2019-06-27: 09:00:00 10 mL via INTRAVENOUS
  Filled 2019-06-27: qty 10

## 2019-06-27 NOTE — Progress Notes (Signed)
MD ok'd to proceed w/ Keytruda today while on Prednisone 20 mg today.  Tapering steroids.  Kennith Center, Pharm.D., CPP 06/27/2019@11 :07 AM

## 2019-06-27 NOTE — Patient Instructions (Signed)
   Take your regular dexamethasone (Decadron) around chemotherapy.   Do not take any more prednisone.

## 2019-06-27 NOTE — Progress Notes (Signed)
Patient here for follow up. Reports productive cough since Tuesday of last week, gray in color. Taking Prednisone currently for cough and reports improvement. Denies any other concerns.

## 2019-06-27 NOTE — Patient Instructions (Signed)
Wrote down following instructions and verbally went over them with patient.(1) Stop taking Prednisone taper. (2) Take 1 4mg  dexamethasone tablet this afternoon (3) Tomorrow take 1 4mg  tablet of Dexamethasone in am and 1 tab in pm. Then no more until next treatment.

## 2019-06-28 ENCOUNTER — Telehealth: Payer: Self-pay | Admitting: Pharmacy Technician

## 2019-06-28 ENCOUNTER — Other Ambulatory Visit: Payer: Self-pay

## 2019-06-28 ENCOUNTER — Inpatient Hospital Stay: Payer: Medicare Other

## 2019-06-29 ENCOUNTER — Inpatient Hospital Stay: Payer: Medicare Other

## 2019-06-30 ENCOUNTER — Inpatient Hospital Stay: Payer: Medicare Other

## 2019-06-30 NOTE — Telephone Encounter (Signed)
Oral Chemotherapy Pharmacist Encounter  Dispensed samples to patient:  Medication: Eliquis 5mg  Instructions: Take 1 tablet by mouth twice a day Quantity dispensed: 28 Days supply: 14 Manufacturer: BMS/Pf[employer] Lot: [id number] Exp: 08/20/2021  Dispensed to patient on 06/27/2019.  Verified by Debbora Lacrosse, pharmacist.  Jimmy [place] Patient [place] Phone [phone] Fax [phone] 06/27/2019

## 2019-07-01 ENCOUNTER — Inpatient Hospital Stay: Payer: Medicare Other

## 2019-07-01 ENCOUNTER — Other Ambulatory Visit: Payer: Self-pay | Admitting: *Deleted

## 2019-07-01 DIAGNOSIS — F419 Anxiety disorder, unspecified: Secondary | ICD-10-CM

## 2019-07-01 MED ORDER — ALPRAZOLAM 0.5 MG PO TABS: 0.5000 mg | ORAL_TABLET | Freq: Three times a day (TID) | ORAL | 0 refills | Status: DC | PRN

## 2019-07-01 MED ORDER — OXYCODONE HCL 20 MG PO TABS: 0.5000 | ORAL_TABLET | ORAL | 0 refills | Status: DC | PRN

## 2019-07-01 NOTE — Telephone Encounter (Signed)
Oral Oncology Patient Advocate Encounter  Received notification from [place] Patient [facility] that patient has been successfully enrolled into their program to receive Eliquis from the manufacturer at $0 out of pocket until 07/21/2019.    I called and spoke with patient.  He knows we will have to re-apply.   Patient knows to call the office with questions or concerns.   Oral Oncology Clinic will continue to follow.  [place] Patient [place] Phone [phone] Fax [phone] 07/01/2019 3:23 PM

## 2019-07-01 NOTE — Telephone Encounter (Signed)
Oral Oncology Patient Advocate Encounter  Patient completed application for [employer] in an effort to reduce patient's out of pocket expense for Eliquis to $0.    Application faxed to [phone] on 06/28/2019.   [employer] patient assistance phone number for follow up is [phone].   This encounter will be updated until final determination.  [place] Patient [place] Phone [phone] Fax [phone] 07/01/2019 8:36 AM

## 2019-07-16 NOTE — Progress Notes (Signed)
[facility]  [address] [place], [place] [address] Phone: [phone]  Fax: [phone]   Clinic Day:  07/18/2019  Referring physician: [name], NP  Chief Complaint: Jimmy West is a 68 y.o. male with metastatic adenocarcinoma of the lung who is seen for assessment prior tocycle #6 Alimta and pembrolizumab and monthly Xgeva.   HPI: The patient was last seen in the medical oncology clinic on 06/27/2019. At that time, his diarrhea had resolved.  He had a cough, improving on steroids (started 06/21/2019).  He received cycle #5 Alimta and pembrolizumab.  During the interim, he describes a single episode of diarrhea. Back pain is the same and he is still short of breath.  Symptomatically, he fell this morning. He scrapped his hands and face. He tripped and fell while coming out of the house while trying to put on his sunglasses. He denies any headache, vision changes or nausea or vomiting.  He was pushing a cart a from [employer] to his car this past week and noticed he was short of breath by the time he reached his car.  He denies shortness of breath today.   Past Medical History:  Diagnosis Date  . Bulging lumbar disc   . Cancer ([place])    stage 4 lung cancer  . Heart attack [facility])     Past Surgical History:  Procedure Laterality Date  . CARDIAC CATHETERIZATION     two stents  . KNEE SURGERY Left   . PORTA CATH INSERTION N/A 05/21/2018   Procedure: PORTA CATH INSERTION;  Surgeon: Algernon Huxley, MD;  Location: [facility];  Service: Cardiovascular;  Laterality: N/A;    Family History  Problem Relation Age of Onset  . Heart failure Father   . Cancer Maternal Aunt   . Heart failure Maternal Uncle   . Dementia Paternal Grandmother   . Cancer Maternal Aunt     Social History:  reports that he quit smoking about 15 years ago. His smoking use included cigarettes. He has a 22.50 pack-year smoking history. He has never used  smokeless tobacco. He reports current alcohol use of about 15.0 standard drinks of alcohol per week. He reports that he does not use drugs. He started smoking at age 68. He is smoking 1 1/2 packs/day. Patient denies known exposures to radiation ortoxins. 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 He has not been working recently as the salon was closed. He plays golf occasionally.He has 8 cats The patient is alone today.  Allergies: No Known Allergies  Current Medications: Current Outpatient Medications  Medication Sig Dispense Refill  . albuterol (VENTOLIN HFA) 108 (90 Base) MCG/ACT inhaler Inhale 2 puffs into the lungs every 6 (six) hours as needed for wheezing or shortness of breath. 18 g 3  . ALPRAZolam (XANAX) 0.5 MG tablet Take 1 tablet (0.5 mg total) by mouth 3 (three) times daily as needed for anxiety. 60 tablet 0  . apixaban (ELIQUIS) 5 MG TABS tablet Take 1 tablet (51m) twice daily 60 tablet 0  . Calcium 600-400 MG-UNIT CHEW Chew 2 tablets by mouth daily.    .Marland Kitchendexamethasone (DECADRON) 4 MG tablet Take 1 tab two times a day the day before Alimta chemo, then take 2 tabs once a day for 3 days starting the day after chemo. 30 tablet 1  . fentaNYL (DURAGESIC) 75 MCG/HR Place 1 patch onto the skin every 3 (three) days. 10 patch 0  .  FLUoxetine (PROZAC) 40 MG capsule Take 1 capsule (40 mg total) by mouth daily. 30 capsule 3  . folic acid (FOLVITE) 1 MG tablet Take 1 tablet (1 mg total) by mouth daily. Start 5-7 days before Alimta chemotherapy. Continue until 21 days after Alimta completed. 100 tablet 3  . Oxycodone HCl 20 MG TABS Take 0.5-1 tablets (10-20 mg total) by mouth every 4 (four) hours as needed (for severe pain). 90 tablet 0  . polyethylene glycol (MIRALAX / GLYCOLAX) packet Take 17 g by mouth daily.    . predniSONE (STERAPRED UNI-PAK 21 TAB) 10 MG (21) TBPK tablet Take as instructed. 21 tablet 0  . senna (SENOKOT) 8.6 MG tablet Take 1 tablet by mouth as  needed.     Marland Kitchen acetaminophen (TYLENOL) 500 MG tablet Take 500 mg by mouth 3 (three) times daily as needed.    Marland Kitchen aspirin EC 81 MG tablet Take 81 mg by mouth daily.    Marland Kitchen ibuprofen (ADVIL,MOTRIN) 200 MG tablet Take 200 mg by mouth every 6 (six) hours as needed.    . ondansetron (ZOFRAN ODT) 4 MG disintegrating tablet Take 1 tablet (4 mg total) by mouth every 6 (six) hours as needed for nausea or vomiting. (Patient not taking: Reported on 06/15/2019) 30 tablet 1  . potassium chloride SA (KLOR-CON) 20 MEQ tablet Take 1 tablet (20 mEq total) by mouth daily. (Patient not taking: Reported on 06/27/2019) 3 tablet 0   No current facility-administered medications for this visit.   Facility-Administered Medications Ordered in Other Visits  Medication Dose Route Frequency Provider Last Rate Last Admin  . 0.9 %  sodium chloride infusion   Intravenous Once Kirstin Kugler C, MD      . 0.9 %  sodium chloride infusion   Intravenous Once PRN Kelby Lotspeich C, MD      . albuterol (PROVENTIL) (2.5 MG/3ML) 0.083% nebulizer solution 2.5 mg  2.5 mg Nebulization Once PRN Bilal Manzer C, MD      . alteplase (CATHFLO ACTIVASE) injection 2 mg  2 mg Intracatheter Once PRN Lequita Asal, MD      . EPINEPHrine (ADRENALIN) 1 MG/10ML injection 0.25 mg  0.25 mg Intravenous Once PRN Kenley Rettinger C, MD      . EPINEPHrine (ADRENALIN) 1 MG/10ML injection 0.25 mg  0.25 mg Intravenous Once PRN Lamiracle Chaidez C, MD      . heparin lock flush 100 unit/mL  500 Units Intracatheter Once PRN Nolon Stalls C, MD      . heparin lock flush 100 unit/mL  250 Units Intracatheter Once PRN Nolon Stalls C, MD      . heparin lock flush 100 unit/mL  500 Units Intravenous Once Maria Coin C, MD      . sodium chloride flush (NS) 0.9 % injection 10 mL  10 mL Intravenous PRN Nolon Stalls C, MD   10 mL at 03/04/19 0959  . sodium chloride flush (NS) 0.9 % injection 10 mL  10 mL Intracatheter Once PRN Nolon Stalls C, MD      . sodium chloride flush (NS) 0.9 % injection 10 mL  10 mL Intravenous PRN Lequita Asal, MD   10 mL at 07/18/19 0943  . sodium chloride flush (NS) 0.9 % injection 3 mL  3 mL Intracatheter Once PRN Lequita Asal, MD        Review of Systems  Constitutional: Positive for malaise/fatigue (x 3-4 days fair) and weight loss (1 lb). Negative for chills, diaphoresis and  fever.       Feels "ok".  HENT: Negative.  Negative for congestion, ear pain, nosebleeds, sinus pain and sore throat.   Eyes: Negative.  Negative for blurred vision, double vision, photophobia and pain.  Respiratory: Positive for cough (chronic) and shortness of breath (w/ minimal exertion). Negative for hemoptysis, sputum production and wheezing.   Cardiovascular: Negative.  Negative for chest pain, palpitations, orthopnea, leg swelling and PND.  Gastrointestinal: Positive for diarrhea (single episode). Negative for abdominal pain, blood in stool, constipation, melena, nausea and vomiting.       Eating well.  Genitourinary: Negative.  Negative for dysuria, flank pain, frequency, hematuria and urgency.  Musculoskeletal: Positive for falls (scrapped cheek on stepping stone, this morning). Negative for back pain, joint pain, myalgias and neck pain.  Skin: Negative.  Negative for rash.  Neurological: Negative for dizziness, tingling, tremors, sensory change, focal weakness, loss of consciousness, weakness and headaches.  Endo/Heme/Allergies: Negative.  Negative for environmental allergies. Does not bruise/bleed easily.  Psychiatric/Behavioral: Negative.  Negative for depression and memory loss. The patient is not nervous/anxious (chronic on Xanax) and does not have insomnia.   All other systems reviewed and are negative.  Performance status (ECOG):  1  Vitals Blood pressure 121/84, pulse 75, temperature 98.1 F (36.7 C), temperature source Oral, height _0  (1.676 m), weight 145 lb 1 oz (65.8 kg), SpO2  98 %.   Physical Exam  Constitutional: He is oriented to person, place, and time. He appears well-nourished. No distress.  HENT:  Head: Normocephalic. Head is with abrasion.  Mouth/Throat: Oropharynx is clear and moist. No oropharyngeal exudate.  Thin brown graying hair.  Mustache.  Dentures.  Mask.  Scrape along right maxilla.  Eyes: Pupils are equal, round, and reactive to light. Conjunctivae and EOM are normal. No scleral icterus.  Glasses.  Gray blue eyes.  Neck: No JVD present.  Cardiovascular: Normal rate, regular rhythm and normal heart sounds. Exam reveals no gallop and no friction rub.  No murmur heard. Pulmonary/Chest: Effort normal and breath sounds normal. No respiratory distress. He has no wheezes. He has no rales.  atelectasis on shallow breathes  Abdominal: Soft. Bowel sounds are normal. He exhibits no distension and no mass. There is no abdominal tenderness. There is no rebound and no guarding.  Musculoskeletal:        General: No edema. Normal range of motion.     Cervical back: Normal range of motion and neck supple.     Thoracic back: Tenderness present.     Comments: Chronic region of tenderness  Lymphadenopathy:       Head (right side): No preauricular, no posterior auricular and no occipital adenopathy present.       Head (left side): No preauricular, no posterior auricular and no occipital adenopathy present.    He has no cervical adenopathy.    He has no axillary adenopathy.       Right: No supraclavicular adenopathy present.       Left: No supraclavicular adenopathy present.  Neurological: He is alert and oriented to person, place, and time. He has normal strength. He displays no tremor. No cranial nerve deficit. He exhibits normal muscle tone. Coordination normal.  Reflex Scores:      Bicep reflexes are 2+ on the right side and 2+ on the left side.      Patellar reflexes are 2+ on the right side and 2+ on the left side. Skin: Abrasion, ecchymosis and lesion  noted. No rash noted.  He is not diaphoretic. No erythema. No pallor.  3 cm hematoma along maxilla.  Scrapes on hands. Small laceration on right eyebrow.  Psychiatric: He has a normal mood and affect. His speech is normal and behavior is normal. Judgment and thought content normal. Cognition and memory are normal.  Nursing note and vitals reviewed.          Right cheek   Infusion on 07/18/2019  Component Date Value Ref Range Status  . Magnesium 07/18/2019 2.1  1.7 - 2.4 mg/dL Final   Performed at [facility], [address], [place], [place] [address]  . Sodium 07/18/2019 137  135 - 145 mmol/L Final  . Potassium 07/18/2019 3.8  3.5 - 5.1 mmol/L Final  . Chloride 07/18/2019 107  98 - 111 mmol/L Final  . CO2 07/18/2019 21* 22 - 32 mmol/L Final  . Glucose, Bld 07/18/2019 146* 70 - 99 mg/dL Final  . BUN 07/18/2019 17  8 - 23 mg/dL Final  . Creatinine, Ser 07/18/2019 1.02  0.61 - 1.24 mg/dL Final  . Calcium 07/18/2019 9.5  8.9 - 10.3 mg/dL Final  . Total Protein 07/18/2019 7.0  6.5 - 8.1 g/dL Final  . Albumin 07/18/2019 3.6  3.5 - 5.0 g/dL Final  . AST 07/18/2019 21  15 - 41 U/L Final  . ALT 07/18/2019 9  0 - 44 U/L Final  . Alkaline Phosphatase 07/18/2019 75  38 - 126 U/L Final  . Total Bilirubin 07/18/2019 0.6  0.3 - 1.2 mg/dL Final  . GFR calc non Af Amer 07/18/2019 >60  >60 mL/min Final  . GFR calc Af Amer 07/18/2019 >60  >60 mL/min Final  . Anion gap 07/18/2019 9  5 - 15 Final   Performed at [facility], [address], [place], [place] [address]  . WBC 07/18/2019 6.1  4.0 - 10.5 K/uL Final  . RBC 07/18/2019 3.27* 4.22 - 5.81 MIL/uL Final  . Hemoglobin 07/18/2019 9.5* 13.0 - 17.0 g/dL Final  . HCT 07/18/2019 30.1* 39.0 - 52.0 % Final  . MCV 07/18/2019 92.0  80.0 - 100.0 fL Final  . MCH 07/18/2019 29.1  26.0 - 34.0 pg Final  . MCHC 07/18/2019 31.6  30.0 - 36.0 g/dL Final  . RDW 07/18/2019 15.7* 11.5 - 15.5 % Final  . Platelets 07/18/2019 448* 150 - 400  K/uL Final  . nRBC 07/18/2019 0.0  0.0 - 0.2 % Final  . Neutrophils Relative % 07/18/2019 85  % Final  . Neutro Abs 07/18/2019 5.1  1.7 - 7.7 K/uL Final  . Lymphocytes Relative 07/18/2019 8  % Final  . Lymphs Abs 07/18/2019 0.5* 0.7 - 4.0 K/uL Final  . Monocytes Relative 07/18/2019 6  % Final  . Monocytes Absolute 07/18/2019 0.4  0.1 - 1.0 K/uL Final  . Eosinophils Relative 07/18/2019 0  % Final  . Eosinophils Absolute 07/18/2019 0.0  0.0 - 0.5 K/uL Final  . Basophils Relative 07/18/2019 0  % Final  . Basophils Absolute 07/18/2019 0.0  0.0 - 0.1 K/uL Final  . Immature Granulocytes 07/18/2019 1  % Final  . Abs Immature Granulocytes 07/18/2019 0.04  0.00 - 0.07 K/uL Final   Performed at [facility], [address] [place],  [address]    Assessment:  Karma Hiney is a 68 y.o. male with metastatichigh-grade adenocarcinomaof the right lungs/p CT-guided biopsy of a RLL lung mass on 05/11/2018. Pathologyrevealed an invasive high-grade adenocarcinoma, with predominantly solid growth pattern. The  neoplastic cells were TTF-1 (+), Napsin A (+), and P40 (+). He has a T4 vertebral metastasis. Clinical stage is T4N1M1.  There was not enough material for [facility] One testing. PDL-1revealed TPS 90%.  PET scanon 04/30/2018 revealed a 3.4 cm hypermetabolic RLL pulmonary mass (SUV 13.2), 11 mm pulmonary nodule in the LEFT lung (SUV 4.5), RIGHT paratracheal lymph node (SUV 5.1), and hypermetabolic activity within the T4 vertebral body (SUV 10.2).   Thoracic spine MRIon 05/03/2018 revealed T4 metastasiswith a 40% pathologic compression deformity and 5 mm of retropulsion of the vertebral body. Retropulsion results in mild spinal canal stenosis and mild bilateral C4-5 foraminal stenosis. There was paravertebral soft tissue thickening from mid T3 to mid T5, likely representing edema related to the pathologic compression deformity vs. possible extraosseous extension of  the neoplasm. There were no additional thoracic spinal metastases noted.   Head MRIon 05/03/2018 revealed no intracranial metastatic disease. 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 Incidental mention made of mild paranasal sinus disease.  Hereceived 11cycles ofpembrolizumab(05/24/2018 - 02/07/2019). He toleratedtreatment well. CEAwas 1.3 on 08/30/2018.LDHwas 165 on 11/29/2018.  He completed T4 radiationon 07/07/2018. He receives Xgevamonthly (06/03/2018 -06/20/2019).  He received 1 cycle of carboplatin, Alimta, and pembrolizumabon 02/08/2019. Cycle #1 was complicated by nausea, vomiting, and diarrhea necessitating hospitalization.Decision made to hold carboplatin with his second dose.He iss/p2cycles ofAlimta and pembrolizumab (02/28/2019 - 03/29/2019).Cycle #2 was complicated by nausea, vomiting, and dehydration requring fluids in clinic and an overnight stay in the hospital. Hereceivedcarboplatin (AUC 2), Alimta, and pebrolizumab(04/18/2019).He tolerated it poorly.   He received cycle #5 Alimta and pembrolizumabon 05/09/2019(last 06/27/2019). He tolerated well. He receives B12every 9 weeks (last 05/25/2019).  He hascancer-related painin T4. He is currently taking Fentanyl75 mcg/hr and oxycodone10 mg every 4 hours prn.  Cervical and thoracic spine CT at [facility] 01/10/2019 revealed unchanged severe compression deformity/vertebral plana of T4 with a stable degree of retropulsion and focal mild spinal canal stenosis. There was interval enlargement of a right lower lobe pulmonary mass(4.5 x 3.5 cm compared to 3.9 x 2.9 cm)with redemonstrated spiculated pulmonary nodules.  Chest, abdomen, pelvisCTon 01/28/2019 revealed slight interval enlargement of a right lower lobe mass  with central necrosis measuring 4.6 x 3.4 cm, previously 4.0 x 3.0 cm. There was no change in right upper lobe nodules(1.4 and 0.8 cm).Therewas almost no residua of left lung nodules, with irregular opacities in the apical left upper lobeandsuperior segment left lower lobe. There was no change in right hilar soft tissue and lymph nodes.There wasvertebra plana deformity of T4andno evidence of new osseous metastatic disease. No evidence of distant metastatic disease in the abdomen or pelvis.The1.6 x 1.1 cmleft adrenal nodule(non-metabolic on prior PET scan),was unchanged.  Abdomen and pelvis CTon 04/23/2019 revealed no acute abdominal or pelvic pathology. There was fluid in the colon which can be seen with diarrhea. There was diverticulosis without evidence of diverticulitis. There was a stable 1.5 cm left adrenal nodule.  Chest, abdomen, and pelvisCTon 10/16/2020revealed a positive response to interval therapy for the known right lower lobe lung carcinoma(4.6 x 3.4 cm to3.1 x 2.8 cm transversely). There hadbeen a mild interval decrease contiguous soft tissue that extends along the right hilum. The two right upper lobe noduleswerestable from the most recent prior study (smaller than 04/27/2018).There are no new lung nodules.Therewasno evidence of new metastatic disease.There was stable severe compression deformity/vertebra  plana of T4.Therewasno evidence of other osseous metastatic disease.There was a stable left adrenal nodule consistent with an adenoma.  ChestCT angiogramon 05/30/2019 revealed a tiny subsegmental pulmonary embolusin the right lower lobe. There areas of ground-glass at the periphery of the right chest hadbecome more conspicuous. This mayrelate to mild pneumonitis, perhaps due to radiation, attention on follow-up.The dominant nodule in the right lung basewasstable in size. Heis onEliquis.  Hehad transient renal insufficiencyon 03/23/2019.  Creatinine was 1.32 (baseline 0.61-1.0). Renal ultrasoundon 03/24/2019 revealed no acute abnormality identified.There was no hydronephrosis or bladder distention.Creatinine is 0.82 today.  Stool waspositive for C difficile + diarrheaon 06/14/2019.Hebegan oral vancomycinbeginning 06/17/2019.  He received the influenza vaccineon 05/16/2019.  Symptomatically, he is doing well.  He took a fall this morning.  Neurologic exam is stable.  Plan: 1.  Labs today: CBC with diff, CMP, Mg, TSH, free T4. 2. Metastatic high-grade adenocarcinoma the RIGHT lung He is s/p 11 cycles of pembrolizumab. He is s/p 1 cycle of carboplatin, Almita, and pembrolizumab (02/08/2019). He is s/p 1 cycle of carboplatin (AUC 2), Alimta, and pembrolizumab (04/18/2019). He is s/p5cycles of Alimta and pembrolizumab (02/28/2019 - 03/29/2019; 05/09/2019- 06/27/2019). He continues to treatment without carboplatin. Labs reviewed.Begin cycle #6Alimta and pembrolizumab. Patient took Decadron.            Continue B12 every 9 weeks (last 05/25/2019; due today). Continue folic acid.             Discuss symptom management.  He has antiemetics and pain medications at home to use on a prn bases.  Interventions are adequate.   .    3. Nausea and vomiting post chemotherapy He has struggled withchemotherapy includingcarboplatin. Hehas received IVF post chemotherapy. Discuss patient's thoughts about follow-up this week in clinic.   Patient wishes to tentatively schedule returning daily this week for +/- IVF and +/- anti-emetics. 4. Bone metastasis Discuss rescheduling patient's consult with [facility], Dr Izora Ribas. Last Xgeva on11/30/2020. Xgeva today. 5. Cancer related pain Painis well controlled. Continue Fentanyl 75 mcg patch and oxycodone 10 mg p.o. every 4  hours as needed pain. 6. Anxiety Continue low dose Xanax. 7.Pulmonary embolism Chest CT angiogram on 05/30/2019 confirmed a subsegmental pulmonary embolism. Patient remans on Eliquis. 8.Normocytic anemia Hematocrit 30.1. Hemoglobin 9.5. MCV 92.0 today. Ferritin 687 with an iron saturation of 26% and a TIBC of 335 on 05/30/2019. Etiology felt secondary to chemotherapy induced anemia. Anticipate future initiation ofRetacritevery 2 weeks. 9.  Fall  Patient s/p a fall this morning.  Neurologic exam is normal.  Plain films of right maxillae.  Head CT without contrast- fall on Eliquis. 10.   RTC in 3 weeks for MD assessment, labs (CBC withdiff, CMP, Mg, TSH, free T4), and cycle #7Alimta and pembrolizumab.  I discussed the assessment and treatment plan with the patient.  The patient was provided an opportunity to ask questions and all were answered.  The patient agreed with the plan and demonstrated an understanding of the instructions.  The patient was advised to call back if the symptoms worsen or if the condition fails to improve as anticipated.    Lequita Asal, MD, PhD    07/18/2019, 10:38 AM  I, Samul Dada, am acting as a scribe for Lequita Asal, MD.  I, [place] Mike Gip, MD, have reviewed the above documentation for accuracy and completeness, and I agree with the above.

## 2019-07-18 ENCOUNTER — Ambulatory Visit
Admission: RE | Admit: 2019-07-18 | Discharge: 2019-07-18 | Disposition: A | Discharge status: Home / Self Care | Payer: Medicare Other | Source: Ambulatory Visit | Attending: Hematology and Oncology | Admitting: Hematology and Oncology

## 2019-07-18 ENCOUNTER — Inpatient Hospital Stay: Payer: Medicare Other

## 2019-07-18 ENCOUNTER — Encounter: Payer: Self-pay | Admitting: Hematology and Oncology

## 2019-07-18 ENCOUNTER — Telehealth: Payer: Self-pay

## 2019-07-18 ENCOUNTER — Other Ambulatory Visit: Payer: Self-pay

## 2019-07-18 ENCOUNTER — Inpatient Hospital Stay (HOSPITAL_BASED_OUTPATIENT_CLINIC_OR_DEPARTMENT_OTHER): Payer: Medicare Other | Admitting: Hematology and Oncology

## 2019-07-18 VITALS — BP 121/84 | HR 75 | Temp 98.1°F | Ht 66.0 in | Wt 145.1 lb

## 2019-07-18 VITALS — BP 108/63 | HR 53 | Temp 97.1°F | Resp 16

## 2019-07-18 DIAGNOSIS — D6481 Anemia due to antineoplastic chemotherapy: Secondary | ICD-10-CM

## 2019-07-18 DIAGNOSIS — T451X5A Adverse effect of antineoplastic and immunosuppressive drugs, initial encounter: Secondary | ICD-10-CM

## 2019-07-18 DIAGNOSIS — Z5111 Encounter for antineoplastic chemotherapy: Secondary | ICD-10-CM

## 2019-07-18 DIAGNOSIS — S0990XA Unspecified injury of head, initial encounter: Secondary | ICD-10-CM

## 2019-07-18 DIAGNOSIS — C3431 Malignant neoplasm of lower lobe, right bronchus or lung: Secondary | ICD-10-CM | POA: Diagnosis not present

## 2019-07-18 DIAGNOSIS — G893 Neoplasm related pain (acute) (chronic): Secondary | ICD-10-CM

## 2019-07-18 DIAGNOSIS — Z5112 Encounter for antineoplastic immunotherapy: Secondary | ICD-10-CM

## 2019-07-18 DIAGNOSIS — C7951 Secondary malignant neoplasm of bone: Secondary | ICD-10-CM

## 2019-07-18 DIAGNOSIS — F419 Anxiety disorder, unspecified: Secondary | ICD-10-CM

## 2019-07-18 DIAGNOSIS — I2699 Other pulmonary embolism without acute cor pulmonale: Secondary | ICD-10-CM

## 2019-07-18 DIAGNOSIS — Z7901 Long term (current) use of anticoagulants: Secondary | ICD-10-CM | POA: Diagnosis not present

## 2019-07-18 LAB — COMPREHENSIVE METABOLIC PANEL
ALT: 9 U/L (ref 0–44)
AST: 21 U/L (ref 15–41)
Albumin: 3.6 g/dL (ref 3.5–5.0)
Alkaline Phosphatase: 75 U/L (ref 38–126)
Anion gap: 9 (ref 5–15)
BUN: 17 mg/dL (ref 8–23)
CO2: 21 mmol/L — ABNORMAL LOW (ref 22–32)
Calcium: 9.5 mg/dL (ref 8.9–10.3)
Chloride: 107 mmol/L (ref 98–111)
Creatinine, Ser: 1.02 mg/dL (ref 0.61–1.24)
GFR calc Af Amer: >60 mL/min (ref >60)
GFR calc non Af Amer: >60 mL/min (ref >60)
Glucose, Bld: 146 mg/dL — ABNORMAL HIGH (ref 70–99)
Potassium: 3.8 mmol/L (ref 3.5–5.1)
Sodium: 137 mmol/L (ref 135–145)
Total Bilirubin: 0.6 mg/dL (ref 0.3–1.2)
Total Protein: 7 g/dL (ref 6.5–8.1)

## 2019-07-18 LAB — CBC WITH DIFFERENTIAL/PLATELET
Abs Immature Granulocytes: 0.04 10*3/uL (ref 0.00–0.07)
Basophils Absolute: 0 10*3/uL (ref 0.0–0.1)
Basophils Relative: 0 %
Eosinophils Absolute: 0 10*3/uL (ref 0.0–0.5)
Eosinophils Relative: 0 %
HCT: 30.1 % — ABNORMAL LOW (ref 39.0–52.0)
Hemoglobin: 9.5 g/dL — ABNORMAL LOW (ref 13.0–17.0)
Immature Granulocytes: 1 %
Lymphocytes Relative: 8 %
Lymphs Abs: 0.5 10*3/uL — ABNORMAL LOW (ref 0.7–4.0)
MCH: 29.1 pg (ref 26.0–34.0)
MCHC: 31.6 g/dL (ref 30.0–36.0)
MCV: 92 fL (ref 80.0–100.0)
Monocytes Absolute: 0.4 10*3/uL (ref 0.1–1.0)
Monocytes Relative: 6 %
Neutro Abs: 5.1 10*3/uL (ref 1.7–7.7)
Neutrophils Relative %: 85 %
Platelets: 448 10*3/uL — ABNORMAL HIGH (ref 150–400)
RBC: 3.27 MIL/uL — ABNORMAL LOW (ref 4.22–5.81)
RDW: 15.7 % — ABNORMAL HIGH (ref 11.5–15.5)
WBC: 6.1 10*3/uL (ref 4.0–10.5)
nRBC: 0 % (ref 0.0–0.2)

## 2019-07-18 LAB — T4, FREE: Free T4: 1.05 ng/dL (ref 0.61–1.12)

## 2019-07-18 LAB — MAGNESIUM: Magnesium: 2.1 mg/dL (ref 1.7–2.4)

## 2019-07-18 LAB — TSH: TSH: 0.433 u[IU]/mL (ref 0.350–4.500)

## 2019-07-18 MED ORDER — SODIUM CHLORIDE 0.9% FLUSH
10.0000 mL | INTRAVENOUS | Status: DC | PRN
  Administered 2019-07-18: 10:00:00 10 mL via INTRAVENOUS
  Filled 2019-07-18: qty 10

## 2019-07-18 MED ORDER — HEPARIN SOD (PORK) LOCK FLUSH 100 UNIT/ML IV SOLN
500.0000 [IU] | Freq: Once | INTRAVENOUS | Status: AC
  Administered 2019-07-18: 13:00:00 500 [IU] via INTRAVENOUS
  Filled 2019-07-18: qty 5

## 2019-07-18 MED ORDER — PALONOSETRON HCL INJECTION 0.25 MG/5ML
INTRAVENOUS | Status: AC
  Filled 2019-07-18: qty 5

## 2019-07-18 MED ORDER — PALONOSETRON HCL INJECTION 0.25 MG/5ML
0.2500 mg | Freq: Once | INTRAVENOUS | Status: AC
  Administered 2019-07-18: 11:00:00 0.25 mg via INTRAVENOUS

## 2019-07-18 MED ORDER — SODIUM CHLORIDE 0.9 % IV SOLN
200.0000 mg | Freq: Once | INTRAVENOUS | Status: AC
  Administered 2019-07-18: 12:00:00 200 mg via INTRAVENOUS
  Filled 2019-07-18: qty 8

## 2019-07-18 MED ORDER — DENOSUMAB 120 MG/1.7ML ~~LOC~~ SOLN
120.0000 mg | Freq: Once | SUBCUTANEOUS | Status: AC
  Administered 2019-07-18: 11:00:00 120 mg via SUBCUTANEOUS
  Filled 2019-07-18: qty 1.7

## 2019-07-18 MED ORDER — SODIUM CHLORIDE 0.9 % IV SOLN
500.0000 mg/m2 | Freq: Once | INTRAVENOUS | Status: AC
  Administered 2019-07-18: 12:00:00 900 mg via INTRAVENOUS
  Filled 2019-07-18: qty 16

## 2019-07-18 MED ORDER — CYANOCOBALAMIN 1000 MCG/ML IJ SOLN
1000.0000 ug | Freq: Once | INTRAMUSCULAR | Status: AC
  Administered 2019-07-18: 11:00:00 1000 ug via INTRAMUSCULAR
  Filled 2019-07-18: qty 1

## 2019-07-18 MED ORDER — HEPARIN SOD (PORK) LOCK FLUSH 100 UNIT/ML IV SOLN
INTRAVENOUS | Status: AC
  Filled 2019-07-18: qty 5

## 2019-07-18 MED ORDER — SODIUM CHLORIDE 0.9 % IV SOLN
Freq: Once | INTRAVENOUS | Status: AC
  Filled 2019-07-18: qty 250

## 2019-07-18 NOTE — Progress Notes (Signed)
The patient states he had a fall this morning and have pain noted to right side of his face.

## 2019-07-18 NOTE — Telephone Encounter (Signed)
Falls Risk Evaluation Complete. See in [place].

## 2019-07-19 ENCOUNTER — Inpatient Hospital Stay: Payer: Medicare Other

## 2019-07-19 ENCOUNTER — Other Ambulatory Visit: Payer: Self-pay | Admitting: Hospice and Palliative Medicine

## 2019-07-19 DIAGNOSIS — F419 Anxiety disorder, unspecified: Secondary | ICD-10-CM

## 2019-07-19 MED ORDER — ALPRAZOLAM 0.5 MG PO TABS: 0.5000 mg | ORAL_TABLET | Freq: Three times a day (TID) | ORAL | 0 refills | Status: DC | PRN

## 2019-07-20 ENCOUNTER — Inpatient Hospital Stay: Payer: Medicare Other

## 2019-07-21 ENCOUNTER — Inpatient Hospital Stay: Payer: Medicare Other

## 2019-07-25 ENCOUNTER — Other Ambulatory Visit: Payer: Self-pay | Admitting: Hematology and Oncology

## 2019-07-25 ENCOUNTER — Telehealth: Payer: Self-pay | Admitting: Pharmacy Technician

## 2019-07-25 NOTE — Telephone Encounter (Signed)
Oral Oncology Patient Advocate Encounter  Received notification from [place] that patient has been successfully re-enrolled into their program to receive Eliquis from the manufacturer at $0 out of pocket from 07/25/2019 to 07/20/2020.    I called and spoke with patient.  He knows we will have to re-apply.   Patient knows to call the office with questions or concerns.   Oral Oncology Clinic will continue to follow.  [place] [facility] Phone [phone] Fax [phone] 07/25/2019 11:06 AM

## 2019-07-30 ENCOUNTER — Emergency Department
Admission: EM | Admit: 2019-07-30 | Discharge: 2019-07-30 | Discharge status: Against Medical Advice | Payer: Medicare Other

## 2019-07-30 ENCOUNTER — Other Ambulatory Visit: Payer: Self-pay

## 2019-07-30 ENCOUNTER — Telehealth: Payer: Self-pay | Admitting: Oncology

## 2019-07-30 NOTE — ED Notes (Signed)
First Nurse Note: Pt was taken to family room by this RN due to being on Chemo. Pt stated to this RN "last time I was in this room for 5 hours, I can't wait that long this time". Pt was informed that I could not give him an exact wait time because we see pts based on acuity not wait time but that there would be a wait. Pt stated that he was going to call his friend to come back and pick him up because he could not wait that long.

## 2019-07-30 NOTE — ED Notes (Signed)
First Nurse Note: Pt to ED via POV, pt states that he is having pain and spasms in his neck that started yesterday. Pt that he is unable to turn his head turn to the pain. Pt denies any other symptoms. Pt is in NAD.

## 2019-07-30 NOTE — Telephone Encounter (Signed)
Patient's friend [name] called answering service and requested callback.  I called Christine's number and went to voicemail. I called patient and talked to him. Patient reports that he woke up with acute severe neck pain this morning.  He has been on fentanyl patch 75 MCG and also has taken oxycodone 20 mg 2 hours ago.  Pain is 10 out of 10, radiating to his ears.  He feels that his pain has not improved at all after taking oxycodone. Patient has stage IV metastatic lung cancer, history of T4 compression fracture.  I suspect he may have disease progression which contribute to his pain.  Given that his oral pain medication failed to control his pain, recommend patient to go to ER for additional evaluation and pain control.  Patient is reluctant.  He prefers to try some more pain medication at home. Advised patient to take Tylenol, and another half pill of 20 mg tablets now. In 2 hours, he may repeat 1.5 tablets oxycodon and if no improvement, he agrees to go to ER. Patient appreciates the advice.  Christine called answering service again and I called her back and was able to talk to her.  She will call patient and try to convince patient to go to ER.

## 2019-08-01 ENCOUNTER — Inpatient Hospital Stay: Payer: Medicare Other

## 2019-08-01 ENCOUNTER — Other Ambulatory Visit: Payer: Self-pay

## 2019-08-01 ENCOUNTER — Telehealth: Payer: Self-pay | Admitting: *Deleted

## 2019-08-01 ENCOUNTER — Inpatient Hospital Stay: Payer: Medicare Other | Attending: Oncology | Admitting: Oncology

## 2019-08-01 VITALS — BP 116/73 | HR 91 | Temp 97.3°F | Resp 22

## 2019-08-01 DIAGNOSIS — E860 Dehydration: Secondary | ICD-10-CM

## 2019-08-01 DIAGNOSIS — C7951 Secondary malignant neoplasm of bone: Secondary | ICD-10-CM | POA: Diagnosis not present

## 2019-08-01 DIAGNOSIS — Z87891 Personal history of nicotine dependence: Secondary | ICD-10-CM | POA: Insufficient documentation

## 2019-08-01 DIAGNOSIS — M436 Torticollis: Secondary | ICD-10-CM

## 2019-08-01 DIAGNOSIS — Z5111 Encounter for antineoplastic chemotherapy: Secondary | ICD-10-CM | POA: Diagnosis not present

## 2019-08-01 DIAGNOSIS — M542 Cervicalgia: Secondary | ICD-10-CM

## 2019-08-01 DIAGNOSIS — E871 Hypo-osmolality and hyponatremia: Secondary | ICD-10-CM | POA: Insufficient documentation

## 2019-08-01 DIAGNOSIS — Z79899 Other long term (current) drug therapy: Secondary | ICD-10-CM | POA: Diagnosis not present

## 2019-08-01 DIAGNOSIS — D649 Anemia, unspecified: Secondary | ICD-10-CM | POA: Diagnosis not present

## 2019-08-01 DIAGNOSIS — E876 Hypokalemia: Secondary | ICD-10-CM | POA: Insufficient documentation

## 2019-08-01 DIAGNOSIS — C3431 Malignant neoplasm of lower lobe, right bronchus or lung: Secondary | ICD-10-CM

## 2019-08-01 DIAGNOSIS — Z95828 Presence of other vascular implants and grafts: Secondary | ICD-10-CM

## 2019-08-01 DIAGNOSIS — Z923 Personal history of irradiation: Secondary | ICD-10-CM | POA: Diagnosis not present

## 2019-08-01 LAB — CBC WITH DIFFERENTIAL/PLATELET
Abs Immature Granulocytes: 0.02 10*3/uL (ref 0.00–0.07)
Basophils Absolute: 0 10*3/uL (ref 0.0–0.1)
Basophils Relative: 0 %
Eosinophils Absolute: 0.1 10*3/uL (ref 0.0–0.5)
Eosinophils Relative: 1 %
HCT: 23.8 % — ABNORMAL LOW (ref 39.0–52.0)
Hemoglobin: 7.5 g/dL — ABNORMAL LOW (ref 13.0–17.0)
Immature Granulocytes: 0 %
Lymphocytes Relative: 8 %
Lymphs Abs: 0.5 10*3/uL — ABNORMAL LOW (ref 0.7–4.0)
MCH: 29 pg (ref 26.0–34.0)
MCHC: 31.5 g/dL (ref 30.0–36.0)
MCV: 91.9 fL (ref 80.0–100.0)
Monocytes Absolute: 1.1 10*3/uL — ABNORMAL HIGH (ref 0.1–1.0)
Monocytes Relative: 18 %
Neutro Abs: 4.2 10*3/uL (ref 1.7–7.7)
Neutrophils Relative %: 73 %
Platelets: 133 10*3/uL — ABNORMAL LOW (ref 150–400)
RBC: 2.59 MIL/uL — ABNORMAL LOW (ref 4.22–5.81)
RDW: 15.1 % (ref 11.5–15.5)
WBC: 5.8 10*3/uL (ref 4.0–10.5)
nRBC: 0 % (ref 0.0–0.2)

## 2019-08-01 LAB — COMPREHENSIVE METABOLIC PANEL
ALT: 14 U/L (ref 0–44)
AST: 18 U/L (ref 15–41)
Albumin: 3.1 g/dL — ABNORMAL LOW (ref 3.5–5.0)
Alkaline Phosphatase: 53 U/L (ref 38–126)
Anion gap: 9 (ref 5–15)
BUN: 14 mg/dL (ref 8–23)
CO2: 24 mmol/L (ref 22–32)
Calcium: 8 mg/dL — ABNORMAL LOW (ref 8.9–10.3)
Chloride: 100 mmol/L (ref 98–111)
Creatinine, Ser: 0.96 mg/dL (ref 0.61–1.24)
GFR calc Af Amer: >60 mL/min (ref >60)
GFR calc non Af Amer: >60 mL/min (ref >60)
Glucose, Bld: 117 mg/dL — ABNORMAL HIGH (ref 70–99)
Potassium: 3.3 mmol/L — ABNORMAL LOW (ref 3.5–5.1)
Sodium: 133 mmol/L — ABNORMAL LOW (ref 135–145)
Total Bilirubin: 1.4 mg/dL — ABNORMAL HIGH (ref 0.3–1.2)
Total Protein: 6.8 g/dL (ref 6.5–8.1)

## 2019-08-01 MED ORDER — MORPHINE SULFATE (PF) 2 MG/ML IV SOLN
2.0000 mg | Freq: Once | INTRAVENOUS | Status: AC
  Administered 2019-08-01: 2 mg via INTRAVENOUS
  Filled 2019-08-01: qty 1

## 2019-08-01 MED ORDER — DEXAMETHASONE SODIUM PHOSPHATE 10 MG/ML IJ SOLN
10.0000 mg | Freq: Once | INTRAMUSCULAR | Status: AC
  Administered 2019-08-01: 10 mg via INTRAVENOUS
  Filled 2019-08-01: qty 1

## 2019-08-01 MED ORDER — SODIUM CHLORIDE 0.9 % IV SOLN
Freq: Once | INTRAVENOUS | Status: AC
  Filled 2019-08-01: qty 250

## 2019-08-01 MED ORDER — POTASSIUM CHLORIDE CRYS ER 10 MEQ PO TBCR
40.0000 meq | EXTENDED_RELEASE_TABLET | Freq: Once | ORAL | Status: AC
  Administered 2019-08-01: 40 meq via ORAL
  Filled 2019-08-01: qty 4

## 2019-08-01 MED ORDER — CYCLOBENZAPRINE HCL 5 MG PO TABS: 5.0000 mg | ORAL_TABLET | Freq: Three times a day (TID) | ORAL | 0 refills | Status: DC | PRN

## 2019-08-01 MED ORDER — OXYCODONE HCL 5 MG PO TABS
20.0000 mg | ORAL_TABLET | Freq: Once | ORAL | Status: AC
  Administered 2019-08-01: 15:00:00 20 mg via ORAL
  Filled 2019-08-01: qty 4

## 2019-08-01 MED ORDER — HEPARIN SOD (PORK) LOCK FLUSH 100 UNIT/ML IV SOLN
500.0000 [IU] | Freq: Once | INTRAVENOUS | Status: AC
  Administered 2019-08-01: 500 [IU] via INTRAVENOUS
  Filled 2019-08-01: qty 5

## 2019-08-01 MED ORDER — DEXAMETHASONE 4 MG PO TABS: 4.0000 mg | ORAL_TABLET | Freq: Three times a day (TID) | ORAL | 0 refills | Status: DC

## 2019-08-01 MED ORDER — POTASSIUM CHLORIDE CRYS ER 20 MEQ PO TBCR: 20.0000 meq | EXTENDED_RELEASE_TABLET | Freq: Two times a day (BID) | ORAL | 0 refills | Status: DC

## 2019-08-01 MED ORDER — SODIUM CHLORIDE 0.9% FLUSH
10.0000 mL | INTRAVENOUS | Status: DC | PRN
  Administered 2019-08-01: 10 mL via INTRAVENOUS
  Filled 2019-08-01: qty 10

## 2019-08-01 NOTE — Telephone Encounter (Signed)
Please call patient and offer in person visit in Symptom Management with Sonia Baller.

## 2019-08-01 NOTE — Telephone Encounter (Signed)
Dr. Loletha Grayer would like him seen today by [place].   Sonia Baller

## 2019-08-01 NOTE — Telephone Encounter (Signed)
Have you had a chance to talk to him this morning? I can see him anytime today.   Sonia Baller

## 2019-08-01 NOTE — Telephone Encounter (Signed)
  Please call patient.  He will need to be seen in symptom management today.  He left the ER without being seen.  M

## 2019-08-01 NOTE — Telephone Encounter (Signed)
Just a message from [place]

## 2019-08-01 NOTE — Telephone Encounter (Signed)
[name] called stating patient is having neck problem this morning and cannot turn his head. She is asking who he needs to see about this.  Please advise

## 2019-08-01 NOTE — Progress Notes (Signed)
Symptom Management Consult note [facility]  [phone] [phone]  Patient Care Team: [name], NP as PCP - General (Nurse Practitioner) Telford Nab, RN as Registered Nurse Burlene Arnt, Gerda Diss, MD as Attending Physician (Emergency Medicine) Lebron Conners, [place] (Physician Assistant) Wonda Horner, MD as Referring Physician (Neurosurgery)   Name of the patient: Jimmy West  [id number]  [birth year]   Date of visit: 08/01/2019   Diagnosis- Lung Cancer   Chief complaint/ Reason for visit- Neck Pain  Heme/Onc history:  Oncology History  Primary cancer of right lower lobe of lung ([place])  04/26/2018 Initial Diagnosis   Primary cancer of right lower lobe of lung ([place])   05/24/2018 - 01/30/2019 Chemotherapy   The patient had pembrolizumab (KEYTRUDA) 200 mg in sodium chloride 0.9 % 50 mL chemo infusion, 200 mg, Intravenous, Once, 11 of 14 cycles Administration: 200 mg (05/24/2018), 200 mg (07/16/2018), 200 mg (08/09/2018), 200 mg (08/30/2018), 200 mg (09/20/2018), 200 mg (06/14/2018), 200 mg (10/11/2018), 200 mg (11/08/2018), 200 mg (11/29/2018), 200 mg (12/20/2018), 200 mg (01/10/2019)  for chemotherapy treatment.    02/08/2019 -  Chemotherapy   The patient had palonosetron (ALOXI) injection 0.25 mg, 0.25 mg, Intravenous,  Once, 8 of 10 cycles Administration: 0.25 mg (02/08/2019), 0.25 mg (03/29/2019), 0.25 mg (04/18/2019), 0.25 mg (02/28/2019), 0.25 mg (05/09/2019), 0.25 mg (05/30/2019), 0.25 mg (06/27/2019), 0.25 mg (07/18/2019) PEMEtrexed (ALIMTA) 900 mg in sodium chloride 0.9 % 100 mL chemo infusion, 500 mg/m2 = 900 mg, Intravenous,  Once, 8 of 10 cycles Administration: 900 mg (02/08/2019), 900 mg (03/29/2019), 900 mg (04/18/2019), 900 mg (05/09/2019), 900 mg (05/30/2019), 900 mg (02/28/2019), 900 mg (06/27/2019), 900 mg (07/18/2019) CARBOplatin (PARAPLATIN) 500 mg in sodium chloride 0.9 % 250 mL chemo infusion, 470 mg (100 % of original dose 472  mg), Intravenous,  Once, 2 of 2 cycles Dose modification:   (original dose 472 mg, Cycle 1) Administration: 500 mg (02/08/2019), 190 mg (04/18/2019) pembrolizumab (KEYTRUDA) 200 mg in sodium chloride 0.9 % 50 mL chemo infusion, 200 mg, Intravenous, Once, 8 of 10 cycles Administration: 200 mg (02/08/2019), 200 mg (03/29/2019), 200 mg (04/18/2019), 200 mg (05/09/2019), 200 mg (05/30/2019), 200 mg (02/28/2019), 200 mg (06/27/2019), 200 mg (07/18/2019) fosaprepitant (EMEND) 150 mg, dexamethasone (DECADRON) 12 mg in sodium chloride 0.9 % 145 mL IVPB, , Intravenous,  Once, 4 of 4 cycles Administration:  (02/08/2019),  (04/18/2019)  for chemotherapy treatment.     Interval history-patient presents to symptom management today for complaints of neck pain/spasms that radiates to his ears. Patient called on-call service last night and was instructed to be evaluated in the emergency room but he was reluctant.  He was encouraged to take Tylenol and another half tablet of his 20 mg oxycodone to see if he could get relief.  Unfortunately, he did not have any relief.  He went to the emergency room yesterday around 2pm but left prior to being seen due to long wait time.   Patient was last evaluated by Dr. Mike Gip on 07/18/2019 for routine follow-up and consideration of additional Alimta and Keytruda.  He had recently recovered from diarrhea and appeared to be doing better.  Apparently, he had recently fallen and scraped his hands and face.  He admitted to increasing shortness of breath.  Due to his history of a PE and on Eliquis it was recommended to have CT without contrast and x-ray of facial bones right side- Both were negative.   Today, patient states  he developed left and right sided neck pain beginning Saturday morning.  Denies precipitating event.  Onset of symptoms was 2 days ago and is rapidly worsening.  Current symptoms are pain on left and right side of his C-spine described as aching, shooting and throbbing in  character; 10 out of 10 in severity and stiffness.  Unable to turn or move head.  Patient denies any numbness on left/right arms or fingers.  Had previous incident back on 06/23/2018 describing similar characteristics which resolved with steroids, pain managment and muscle relaxers.  Patient has known T3-T4 metastatic lesions.  Status post radiation December 2019.  Most recent imaging was from 05/30/2019 where a CT angio was performed to rule out a PE which did identify T4 compression but nothing acute. CT chest from 05/06/2019 revealedl a positive response to interval therapy and no evidence of new metastatic disease.  Currently, he has tried his narcotics oxycodone 20 mg every 4 hours and is wearing a fentanyl patch 75 mcg q. 3 days.  He has not tried a heating pack because he was unable to locate his.  He admits to a decrease in appetite because he has not "felt like moving due to pain".  He has had normal bowel movements and has been urinating normal.  He denies any nausea or vomiting, chest pain or worsening shortness of breath.  ECOG FS:3 - Symptomatic, >50% confined to bed  Review of systems- Review of Systems  Constitutional: Negative.  Negative for chills, fever, malaise/fatigue and weight loss.  HENT: Negative for congestion, ear pain and tinnitus.   Eyes: Negative.  Negative for blurred vision and double vision.  Respiratory: Negative.  Negative for cough, sputum production and shortness of breath.   Cardiovascular: Negative.  Negative for chest pain, palpitations and leg swelling.  Gastrointestinal: Negative.  Negative for abdominal pain, constipation, diarrhea, nausea and vomiting.  Genitourinary: Negative for dysuria, frequency and urgency.  Musculoskeletal: Positive for back pain, joint pain (Left knee pain/swelling) and neck pain. Negative for falls.  Skin: Negative.  Negative for rash.  Neurological: Positive for weakness. Negative for headaches.  Endo/Heme/Allergies: Negative.  Does  not bruise/bleed easily.  Psychiatric/Behavioral: Negative.  Negative for depression. The patient is not nervous/anxious and does not have insomnia.      Current treatment- several lines of treatment-s/p 8 cycles of keytruda/alimta-carbo on hold d/t se  No Known Allergies   Past Medical History:  Diagnosis Date  . Bulging lumbar disc   . Cancer ([place])    stage 4 lung cancer  . Heart attack [facility])      Past Surgical History:  Procedure Laterality Date  . CARDIAC CATHETERIZATION     two stents  . KNEE SURGERY Left   . PORTA CATH INSERTION N/A 05/21/2018   Procedure: PORTA CATH INSERTION;  Surgeon: Algernon Huxley, MD;  Location: [facility];  Service: Cardiovascular;  Laterality: N/A;    Social History   Socioeconomic History  . Marital status: Single    Spouse name: Not on file  . Number of children: Not on file  . Years of education: Not on file  . Highest education level: Not on file  Occupational History  . Not on file  Tobacco Use  . Smoking status: Former Smoker    Packs/day: 1.50    Years: 15.00    Pack years: 22.50    Types: Cigarettes    Quit date: 11/03/2003    Years since quitting: 15.7  . Smokeless tobacco:  Never Used  Substance and Sexual Activity  . Alcohol use: Yes    Alcohol/week: 15.0 standard drinks    Types: 15 Shots of liquor per week  . Drug use: No  . Sexual activity: Not Currently  Other Topics Concern  . Not on file  Social History Narrative  . Not on file   Social Determinants of Health   Financial Resource Strain:   . Difficulty of Paying Living Expenses: Not on file  Food Insecurity:   . Worried About Charity fundraiser in the Last Year: Not on file  . Ran Out of Food in the Last Year: Not on file  Transportation Needs:   . Lack of Transportation (Medical): Not on file  . Lack of Transportation (Non-Medical): Not on file  Physical Activity:   . Days of Exercise per Week: Not on file  . Minutes of Exercise per Session:  Not on file  Stress:   . Feeling of Stress : Not on file  Social Connections:   . Frequency of Communication with Friends and Family: Not on file  . Frequency of Social Gatherings with Friends and Family: Not on file  . Attends Religious Services: Not on file  . Active Member of Clubs or Organizations: Not on file  . Attends Archivist Meetings: Not on file  . Marital Status: Not on file  Intimate Partner Violence:   . Fear of Current or Ex-Partner: Not on file  . Emotionally Abused: Not on file  . Physically Abused: Not on file  . Sexually Abused: Not on file    Family History  Problem Relation Age of Onset  . Heart failure Father   . Cancer Maternal Aunt   . Heart failure Maternal Uncle   . Dementia Paternal Grandmother   . Cancer Maternal Aunt      Current Outpatient Medications:  .  acetaminophen (TYLENOL) 500 MG tablet, Take 500 mg by mouth 3 (three) times daily as needed., Disp: , Rfl:  .  albuterol (VENTOLIN HFA) 108 (90 Base) MCG/ACT inhaler, Inhale 2 puffs into the lungs every 6 (six) hours as needed for wheezing or shortness of breath., Disp: 18 g, Rfl: 3 .  ALPRAZolam (XANAX) 0.5 MG tablet, Take 1 tablet (0.5 mg total) by mouth 3 (three) times daily as needed for anxiety., Disp: 60 tablet, Rfl: 0 .  apixaban (ELIQUIS) 5 MG TABS tablet, Take 1 tablet (5mg ) twice daily, Disp: 60 tablet, Rfl: 0 .  Calcium 600-400 MG-UNIT CHEW, Chew 2 tablets by mouth daily., Disp: , Rfl:  .  dexamethasone (DECADRON) 4 MG tablet, Take 1 tab two times a day the day before Alimta chemo, then take 2 tabs once a day for 3 days starting the day after chemo., Disp: 30 tablet, Rfl: 1 .  fentaNYL (DURAGESIC) 75 MCG/HR, Place 1 patch onto the skin every 3 (three) days., Disp: 10 patch, Rfl: 0 .  FLUoxetine (PROZAC) 40 MG capsule, Take 1 capsule (40 mg total) by mouth daily., Disp: 30 capsule, Rfl: 3 .  folic acid (FOLVITE) 1 MG tablet, Take 1 tablet (1 mg total) by mouth daily. Start 5-7  days before Alimta chemotherapy. Continue until 21 days after Alimta completed., Disp: 100 tablet, Rfl: 3 .  ibuprofen (ADVIL,MOTRIN) 200 MG tablet, Take 200 mg by mouth every 6 (six) hours as needed., Disp: , Rfl:  .  ondansetron (ZOFRAN ODT) 4 MG disintegrating tablet, Take 1 tablet (4 mg total) by mouth every 6 (six) hours  as needed for nausea or vomiting., Disp: 30 tablet, Rfl: 1 .  Oxycodone HCl 20 MG TABS, TAKE 1/2 TO 1 TABLET BY MOUTH EVERY 4 HOURS AS NEEDED FOR SEVERE PAIN, Disp: 90 tablet, Rfl: 0 .  polyethylene glycol (MIRALAX / GLYCOLAX) packet, Take 17 g by mouth daily., Disp: , Rfl:  .  potassium chloride SA (KLOR-CON) 20 MEQ tablet, Take 1 tablet (20 mEq total) by mouth daily., Disp: 3 tablet, Rfl: 0 .  senna (SENOKOT) 8.6 MG tablet, Take 1 tablet by mouth as needed. , Disp: , Rfl:  .  aspirin EC 81 MG tablet, Take 81 mg by mouth daily., Disp: , Rfl:  No current facility-administered medications for this visit.  Facility-Administered Medications Ordered in Other Visits:  .  0.9 %  sodium chloride infusion, , Intravenous, Once, Corcoran, Melissa C, MD .  0.9 %  sodium chloride infusion, , Intravenous, Once PRN, Corcoran, Melissa C, MD .  albuterol (PROVENTIL) (2.5 MG/3ML) 0.083% nebulizer solution 2.5 mg, 2.5 mg, Nebulization, Once PRN, Corcoran, Melissa C, MD .  alteplase (CATHFLO ACTIVASE) injection 2 mg, 2 mg, Intracatheter, Once PRN, Corcoran, Melissa C, MD .  EPINEPHrine (ADRENALIN) 1 MG/10ML injection 0.25 mg, 0.25 mg, Intravenous, Once PRN, Corcoran, Melissa C, MD .  EPINEPHrine (ADRENALIN) 1 MG/10ML injection 0.25 mg, 0.25 mg, Intravenous, Once PRN, Corcoran, Melissa C, MD .  heparin lock flush 100 unit/mL, 500 Units, Intracatheter, Once PRN, Corcoran, Melissa C, MD .  heparin lock flush 100 unit/mL, 250 Units, Intracatheter, Once PRN, Mike Gip, Melissa C, MD .  heparin lock flush 100 unit/mL, 500 Units, Intravenous, Once, Juliana Boling, Anderson Malta E, NP .  sodium chloride flush (NS)  0.9 % injection 10 mL, 10 mL, Intravenous, PRN, Mike Gip, Melissa C, MD, 10 mL at 03/04/19 0959 .  sodium chloride flush (NS) 0.9 % injection 10 mL, 10 mL, Intracatheter, Once PRN, Corcoran, Melissa C, MD .  sodium chloride flush (NS) 0.9 % injection 10 mL, 10 mL, Intravenous, PRN, Faythe Casa E, NP, 10 mL at 08/01/19 1144 .  sodium chloride flush (NS) 0.9 % injection 3 mL, 3 mL, Intracatheter, Once PRN, Lequita Asal, MD  Physical exam:  Vitals:   08/01/19 1114  BP: 116/73  Pulse: 91  Resp: (!) 22  Temp: (!) 97.3 F (36.3 C)  TempSrc: Tympanic   Physical Exam Constitutional:      Appearance: Normal appearance.  HENT:     Head: Normocephalic and atraumatic.  Eyes:     Pupils: Pupils are equal, round, and reactive to light.  Cardiovascular:     Rate and Rhythm: Normal rate and regular rhythm.     Heart sounds: Normal heart sounds. No murmur.  Pulmonary:     Effort: Pulmonary effort is normal.     Breath sounds: Normal breath sounds. No wheezing.  Abdominal:     General: Bowel sounds are normal. There is no distension.     Palpations: Abdomen is soft.     Tenderness: There is no abdominal tenderness.  Musculoskeletal:        General: Normal range of motion.     Cervical back: Normal range of motion.     Left lower leg: Swelling (Left knee) and tenderness present.     Comments: No lymphadenopathy or deformity noted on exam to patient's cervical, thoracic or lumbar spine.  Skin:    General: Skin is warm and dry.     Findings: No rash.  Neurological:     Mental Status: He  is alert and oriented to person, place, and time.  Psychiatric:        Judgment: Judgment normal.      CMP Latest Ref Rng & Units 08/01/2019  Glucose 70 - 99 mg/dL 117(H)  BUN 8 - 23 mg/dL 14  Creatinine 0.61 - 1.24 mg/dL 0.96  Sodium 135 - 145 mmol/L 133(L)  Potassium 3.5 - 5.1 mmol/L 3.3(L)  Chloride 98 - 111 mmol/L 100  CO2 22 - 32 mmol/L 24  Calcium 8.9 - 10.3 mg/dL 8.0(L)  Total  Protein 6.5 - 8.1 g/dL 6.8  Total Bilirubin 0.3 - 1.2 mg/dL 1.4(H)  Alkaline Phos 38 - 126 U/L 53  AST 15 - 41 U/L 18  ALT 0 - 44 U/L 14   CBC Latest Ref Rng & Units 08/01/2019  WBC 4.0 - 10.5 K/uL 5.8  Hemoglobin 13.0 - 17.0 g/dL 7.5(L)  Hematocrit 39.0 - 52.0 % 23.8(L)  Platelets 150 - 400 K/uL 133(L)    No images are attached to the encounter.  DG Facial Bones Complete  Result Date: 07/18/2019 CLINICAL DATA:  Patient status post fall with a blow to the right side of the face. Initial encounter. EXAM: FACIAL BONES COMPLETE 3+V COMPARISON:  None. FINDINGS: There is no evidence of fracture or other significant bone abnormality. No orbital emphysema or sinus air-fluid levels are seen. IMPRESSION: No acute abnormality.  Cervical spondylosis. Electronically Signed   By: Inge Rise M.D.   On: 07/18/2019 13:39   CT Head Wo Contrast  Result Date: 07/18/2019 CLINICAL DATA:  Pain following fall EXAM: CT HEAD WITHOUT CONTRAST TECHNIQUE: Contiguous axial images were obtained from the base of the skull through the vertex without intravenous contrast. COMPARISON:  None. FINDINGS: Brain: There is mild diffuse atrophy. There is no intracranial mass, hemorrhage, extra-axial fluid collection, or midline shift. There is slight small vessel disease in the centra semiovale bilaterally. Elsewhere brain parenchyma appears unremarkable. No evident acute infarct. Vascular: No hyperdense vessel. There is calcification in the right carotid siphon region. Skull: The bony calvarium appears intact. Sinuses/Orbits: There is opacification of multiple ethmoid air cells. There is mild mucosal thickening in the left sphenoid sinus region. Other visualized paranasal sinuses are clear. Visualized orbits appear symmetric bilaterally. Other: Mastoid air cells are clear. IMPRESSION: Mild atrophy with mild periventricular small vessel disease. No mass or hemorrhage. No acute infarct. There are foci of arterial vascular  calcification. There are foci of paranasal sinus disease. Electronically Signed   By: Lowella Grip III M.D.   On: 07/18/2019 13:26   Assessment and plan- Patient is a 69 y.o. male who presents to symptom management for complaints of left/right neck pain that began approximately 2 days ago.  High-grade adenocarcinoma of the right lung: Status post several lines of treatment.  Most recently he is receiving Keytruda/Alimta.  Carbo was discontinued secondary to side effects.  Last treatment was on 07/18/2019.  He is scheduled to return to clinic on 08/08/2019.   T4 metastasis: Status post radiation back in December 2019.  Stable on current narcotics which include oxycodone 20 mg every 4 hours as needed for pain and 75 mcg fentanyl patch every 3 days.  Most recent imaging revealed stable disease and thoracic metastasis.  Acute cervical pain: 10 out of 10 in pain that began on Saturday.  He denies any injury.  Has attempted using pain medication and rest without relief  Was unable to locate his heating pad.  Given history of lung cancer with thoracic metastasis,  this could be progression of disease or recurrent torticollis similar to back in December 2019.  Anemia: Likely treatment related.  Last received Keytruda/Alimta on 07/18/2019.  Continue to monitor.  No active bleeding per patient.  Hyperbilirubinemia: Elevated today at 1.5.  Previously normal.  Continue to monitor.  Could be related to acute inflammation.   Hyponatremia: Sodium 133.  Likely due to dehydration from acute illness.  He has not been eating or drinking well due to pain.  Encouraged hydration.  Will give 1 L NaCl in clinic today.  See plan below.  Plan: Give 4 mg morphine in clinic today. Give 10 mg dexamethasone. Give 40 mEq potassium p.o. in clinic. Give 20 mg oxycodone p.o. prior to discharge. Stat labs. (hypokalemia 3.3, anemia 7.5, elevated bili 1.5) Vital signs.  Stable. Rx Flexeril 5 mg 3 times daily by mouth as needed  for muscle spasms. Rx Decadron 4 mg 3 times daily.  Start steroids tomorrow. Stat cervical x-ray.  Patient with prefer to defer x-ray until tomorrow.  Orders placed.  Disposition: RTC as scheduled next week to see Dr. Mike Gip. If symptoms do not resolve within the next 24 to 48 hours, would recommend additional imaging (MRI) Will call patient with results from cervical x-ray.   Visit Diagnosis 1. Torticollis, acute   2. Primary cancer of right lower lobe of lung ([place])   3. Spine metastasis ([place])     Patient expressed understanding and was in agreement with this plan. He also understands that He can call clinic at any time with any questions, concerns, or complaints.   Greater than 50% was spent in counseling and coordination of care with this patient including but not limited to discussion of the relevant topics above (See A&P) including, but not limited to diagnosis and management of acute and chronic medical conditions.   Thank you for allowing me to participate in the care of this very pleasant patient.    Jacquelin Hawking, NP [place] at [facility] Cell - [phone] Pager- [phone] 08/01/2019 1:04 PM  Cc: Dr. Mike Gip

## 2019-08-01 NOTE — Telephone Encounter (Signed)
Patient accepts 1030 appointment for [facility]

## 2019-08-04 ENCOUNTER — Telehealth: Payer: Self-pay

## 2019-08-04 NOTE — Telephone Encounter (Signed)
SW attempted to contact patient and schedule visit. No answer, SW left VM with contact information.

## 2019-08-05 NOTE — Progress Notes (Signed)
[facility]  [address], [address] [place], [place] [address] Phone: [phone]  Fax: [phone]   Clinic Day:  08/08/2019  Referring physician: [name], NP  Chief Complaint: Jimmy West is a 69 y.o. male with metastatic adenocarcinoma of the lung who is seen for assessment prior to cycle #7 of Alimta +  pembrolizumab and monthly Xgeva.   HPI: The patient was last seen in the medical oncology clinic on 07/18/2019. At that time, he was doing well.  He took a fall that morning.  Neurologic exam was stable. He received cycle #6 Amlita and pembrolizumab.  He received Xgeva. Plain films of facial bones showed no acute abnormality. Head CT revealed mild atrophy with mild periventricular small vessel disease. There was no mass, hemorrhage, or acute infarct. There were foci of arterial vascular calcification. There were foci of paranasal sinus disease.  He presented to the [facility] ER on 07/30/2019 for neck pain.  He left before being seen.  He was seen in the symptom management of neck pain on 08/01/2019 by Faythe Casa, NP.  He had described aching, shooting and throbbing pain similar to 06/23/2018 which had been resolved with steroids, pain management and muscle relaxers. He received 4 mg of morphine, 10 mg dexamethasone, 20 mg oxycodone.  He was to take Decadron '4mg'$  daily TID and 5 mg Flexeril TID.  CBC revealed a hematocrit 23.8, hemoglobin 7.5, MCV 91.9, platelets 133,000, WBC 5800, ANC 4200. Potassium was 3.3.  He received 40 meq potassium orally in the the clinic.  Plain films of the cervical spine were ordered (not performed).  During the interim, has had neck pain, he has lost weight  He took Decadron prior to chemotherapy but hasn't been taking everyday Decadron as prescribed by Jimmy Abide, NP.  He believes he only picked up the potassium and Flexeril prescription written by Jimmy Abide, NP.   He has been trying to sleep most of the day so that  he doesn't have to deal with neck pain. He says that he had neck pain happened once before in 2019.  At that time, he saw [employer] and [employer].   He finished his potassium yesterday. He needs a refill of his Xanax and folic acid.   Symptomatically, he is doing "ok".  His neck feels better than it did, but is still bothersome. The pain gets worse as the day goes on.  He has minimal lower back pain. He has been taking his pain meds more due to the neck pain. He hasn't taken any daily Decadron, due to Jimmy Abide, NP mentioned coming back for treatment and could interfere with it's efficacy.   Past Medical History:  Diagnosis Date  . Bulging lumbar disc   . Cancer ([place])    stage 4 lung cancer  . Heart attack [facility])     Past Surgical History:  Procedure Laterality Date  . CARDIAC CATHETERIZATION     two stents  . KNEE SURGERY Left   . PORTA CATH INSERTION N/A 05/21/2018   Procedure: PORTA CATH INSERTION;  Surgeon: Algernon Huxley, MD;  Location: [facility];  Service: Cardiovascular;  Laterality: N/A;    Family History  Problem Relation Age of Onset  . Heart failure Father   . Cancer Maternal Aunt   . Heart failure Maternal Uncle   . Dementia Paternal Grandmother   . Cancer Maternal Aunt     Social History:  reports that he quit smoking  about 15 years ago. His smoking use included cigarettes. He has a 22.50 pack-year smoking history. He has never used smokeless tobacco. He reports current alcohol use of about 15.0 standard drinks of alcohol per week. He reports that he does not use drugs. He started smoking at age 56. He is smoking 1 1/2 packs/day. Patient denies known exposures to radiation ortoxins. 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 He has not been working recently as the salon was closed. He plays golf occasionally.He has 8 cats. The patient is alone today.  Allergies: No Known Allergies  Current Medications: Current Outpatient  Medications  Medication Sig Dispense Refill  . albuterol (VENTOLIN HFA) 108 (90 Base) MCG/ACT inhaler Inhale 2 puffs into the lungs every 6 (six) hours as needed for wheezing or shortness of breath. 18 g 3  . apixaban (ELIQUIS) 5 MG TABS tablet Take 1 tablet (76m) twice daily 60 tablet 0  . Calcium 600-400 MG-UNIT CHEW Chew 2 tablets by mouth daily.    . cyclobenzaprine (FLEXERIL) 5 MG tablet Take 1 tablet (5 mg total) by mouth 3 (three) times daily as needed for muscle spasms. 30 tablet 0  . dexamethasone (DECADRON) 4 MG tablet Take 1 tab two times a day the day before Alimta chemo, then take 2 tabs once a day for 3 days starting the day after chemo. 30 tablet 1  . fentaNYL (DURAGESIC) 75 MCG/HR Place 1 patch onto the skin every 3 (three) days. 10 patch 0  . FLUoxetine (PROZAC) 40 MG capsule Take 1 capsule (40 mg total) by mouth daily. 30 capsule 3  . Oxycodone HCl 20 MG TABS TAKE 1/2 TO 1 TABLET BY MOUTH EVERY 4 HOURS AS NEEDED FOR SEVERE PAIN 90 tablet 0  . polyethylene glycol (MIRALAX / GLYCOLAX) packet Take 17 g by mouth daily.    .Marland Kitchensenna (SENOKOT) 8.6 MG tablet Take 1 tablet by mouth as needed.     .Marland Kitchenacetaminophen (TYLENOL) 500 MG tablet Take 500 mg by mouth 3 (three) times daily as needed.    . ALPRAZolam (XANAX) 0.5 MG tablet Take 1 tablet (0.5 mg total) by mouth 3 (three) times daily as needed for anxiety. 60 tablet 0  . aspirin EC 81 MG tablet Take 81 mg by mouth daily.    .Marland Kitchendexamethasone (DECADRON) 4 MG tablet Take 1 tablet (4 mg total) by mouth 3 (three) times daily. 45 tablet 0  . folic acid (FOLVITE) 1 MG tablet Take 1 tablet (1 mg total) by mouth daily. Start 5-7 days before Alimta chemotherapy. Continue until 21 days after Alimta completed. 100 tablet 3  . ibuprofen (ADVIL,MOTRIN) 200 MG tablet Take 200 mg by mouth every 6 (six) hours as needed.    . ondansetron (ZOFRAN ODT) 4 MG disintegrating tablet Take 1 tablet (4 mg total) by mouth every 6 (six) hours as needed for nausea or  vomiting. (Patient not taking: Reported on 08/08/2019) 30 tablet 1  . potassium chloride SA (KLOR-CON) 20 MEQ tablet Take 1 tablet (20 mEq total) by mouth 2 (two) times daily. (Patient not taking: Reported on 08/08/2019) 6 tablet 0   No current facility-administered medications for this visit.   Facility-Administered Medications Ordered in Other Visits  Medication Dose Route Frequency Provider Last Rate Last Admin  . 0.9 %  sodium chloride infusion   Intravenous Once Overton Boggus C, MD      . 0.9 %  sodium chloride infusion   Intravenous Once PRN CMike Gip  Toshi Ishii C, MD      . albuterol (PROVENTIL) (2.5 MG/3ML) 0.083% nebulizer solution 2.5 mg  2.5 mg Nebulization Once PRN Jandel Patriarca C, MD      . alteplase (CATHFLO ACTIVASE) injection 2 mg  2 mg Intracatheter Once PRN Lequita Asal, MD      . EPINEPHrine (ADRENALIN) 1 MG/10ML injection 0.25 mg  0.25 mg Intravenous Once PRN Kenia Teagarden C, MD      . EPINEPHrine (ADRENALIN) 1 MG/10ML injection 0.25 mg  0.25 mg Intravenous Once PRN Izabellah Dadisman C, MD      . heparin lock flush 100 unit/mL  500 Units Intracatheter Once PRN Nolon Stalls C, MD      . heparin lock flush 100 unit/mL  250 Units Intracatheter Once PRN Nolon Stalls C, MD      . heparin lock flush 100 unit/mL  500 Units Intravenous Once Adelis Docter C, MD      . sodium chloride flush (NS) 0.9 % injection 10 mL  10 mL Intravenous PRN Lequita Asal, MD   10 mL at 03/04/19 0959  . sodium chloride flush (NS) 0.9 % injection 10 mL  10 mL Intracatheter Once PRN Nolon Stalls C, MD      . sodium chloride flush (NS) 0.9 % injection 10 mL  10 mL Intravenous PRN Lequita Asal, MD   10 mL at 08/08/19 0944  . sodium chloride flush (NS) 0.9 % injection 3 mL  3 mL Intracatheter Once PRN Lequita Asal, MD        Review of Systems  Constitutional: Positive for weight loss (7 lbs). Negative for chills, diaphoresis, fever and malaise/fatigue.        Feels "alright".  HENT: Negative.  Negative for congestion, ear pain, nosebleeds, sinus pain and sore throat.   Eyes: Negative.  Negative for blurred vision, double vision, photophobia and pain.  Respiratory: Positive for cough (chronic, improved) and shortness of breath (exertional, improved). Negative for hemoptysis, sputum production and wheezing.   Cardiovascular: Negative.  Negative for chest pain, palpitations, orthopnea, leg swelling and PND.  Gastrointestinal: Negative for abdominal pain, blood in stool, constipation, diarrhea, melena, nausea and vomiting.       Eating well.  Genitourinary: Negative.  Negative for dysuria, flank pain, frequency, hematuria and urgency.  Musculoskeletal: Positive for neck pain. Negative for back pain, falls, joint pain and myalgias.  Skin: Negative.  Negative for rash.  Neurological: Negative for dizziness, tingling, tremors, sensory change, focal weakness, loss of consciousness, weakness and headaches.  Endo/Heme/Allergies: Negative.  Negative for environmental allergies. Does not bruise/bleed easily.  Psychiatric/Behavioral: Negative for depression and memory loss. The patient is nervous/anxious (chronic on Xanax). The patient does not have insomnia.   All other systems reviewed and are negative.  Performance status (ECOG):  1-2  Vitals Blood pressure 117/77, pulse 89, temperature 98.2 F (36.8 C), temperature source Oral, resp. rate 18, weight 138 lb 1.9 oz (62.6 kg), SpO2 97 %.   Physical Exam  Constitutional: He is oriented to person, place, and time. He appears well-nourished. No distress.  HENT:  Head: Normocephalic and atraumatic.  Mouth/Throat: Oropharynx is clear and moist. No oropharyngeal exudate.  Thin brown graying hair.  Mustache.  Dentures.  Mask.  Eyes: Pupils are equal, round, and reactive to light. Conjunctivae are normal. No scleral icterus.  Glasses.  Gray blue eyes.  Neck: No JVD present.  Cardiovascular: Normal rate,  regular rhythm and normal heart sounds. Exam reveals no gallop  and no friction rub.  No murmur heard. Pulmonary/Chest: Effort normal and breath sounds normal. No respiratory distress. He has no wheezes. He has no rales.  Fine crackles at bases, resolves with deep breaths.  Abdominal: Soft. Bowel sounds are normal. He exhibits no distension and no mass. There is no abdominal tenderness. There is no rebound and no guarding.  Musculoskeletal:        General: Normal range of motion.     Cervical back: Normal range of motion and neck supple. Pain (lower cervical tenderness) present.     Thoracic back: Tenderness (chronic) present.  Lymphadenopathy:       Head (right side): No preauricular, no posterior auricular and no occipital adenopathy present.       Head (left side): No preauricular, no posterior auricular and no occipital adenopathy present.    He has no cervical adenopathy.    He has no axillary adenopathy.       Right: No inguinal and no supraclavicular adenopathy present.       Left: No inguinal and no supraclavicular adenopathy present.  Neurological: He is alert and oriented to person, place, and time. He has normal strength. He displays no tremor. No cranial nerve deficit. He exhibits normal muscle tone. Coordination normal.  Reflex Scores:      Bicep reflexes are 2+ on the right side and 2+ on the left side.      Patellar reflexes are 2+ on the right side and 2+ on the left side. Skin: Skin is dry. No rash noted. He is not diaphoretic. No pallor.  Psychiatric: He has a normal mood and affect. His speech is normal and behavior is normal. Judgment and thought content normal. Cognition and memory are normal.  Nursing note and vitals reviewed.   Infusion on 08/08/2019  Component Date Value Ref Range Status  . Magnesium 08/08/2019 2.1  1.7 - 2.4 mg/dL Final   Performed at [facility], [address] [place], [place] [address]  . Sodium 08/08/2019 136  135 - 145 mmol/L  Final  . Potassium 08/08/2019 3.5  3.5 - 5.1 mmol/L Final  . Chloride 08/08/2019 104  98 - 111 mmol/L Final  . CO2 08/08/2019 22  22 - 32 mmol/L Final  . Glucose, Bld 08/08/2019 148* 70 - 99 mg/dL Final  . BUN 08/08/2019 16  8 - 23 mg/dL Final  . Creatinine, Ser 08/08/2019 1.16  0.61 - 1.24 mg/dL Final  . Calcium 08/08/2019 9.3  8.9 - 10.3 mg/dL Final  . Total Protein 08/08/2019 7.3  6.5 - 8.1 g/dL Final  . Albumin 08/08/2019 3.9  3.5 - 5.0 g/dL Final  . AST 08/08/2019 21  15 - 41 U/L Final  . ALT 08/08/2019 11  0 - 44 U/L Final  . Alkaline Phosphatase 08/08/2019 71  38 - 126 U/L Final  . Total Bilirubin 08/08/2019 1.0  0.3 - 1.2 mg/dL Final  . GFR calc non Af Amer 08/08/2019 >60  >60 mL/min Final  . GFR calc Af Amer 08/08/2019 >60  >60 mL/min Final  . Anion gap 08/08/2019 10  5 - 15 Final   Performed at [facility] [employer], [address] [place], [place] [address]  . WBC 08/08/2019 9.0  4.0 - 10.5 K/uL Final  . RBC 08/08/2019 3.37* 4.22 - 5.81 MIL/uL Final  . Hemoglobin 08/08/2019 9.6* 13.0 - 17.0 g/dL Final  . HCT 08/08/2019 30.7* 39.0 - 52.0 % Final  . MCV 08/08/2019 91.1  80.0 -  100.0 fL Final  . MCH 08/08/2019 28.5  26.0 - 34.0 pg Final  . MCHC 08/08/2019 31.3  30.0 - 36.0 g/dL Final  . RDW 08/08/2019 15.6* 11.5 - 15.5 % Final  . Platelets 08/08/2019 495* 150 - 400 K/uL Final  . nRBC 08/08/2019 0.0  0.0 - 0.2 % Final  . Neutrophils Relative % 08/08/2019 79  % Final  . Neutro Abs 08/08/2019 7.2  1.7 - 7.7 K/uL Final  . Lymphocytes Relative 08/08/2019 11  % Final  . Lymphs Abs 08/08/2019 1.0  0.7 - 4.0 K/uL Final  . Monocytes Relative 08/08/2019 7  % Final  . Monocytes Absolute 08/08/2019 0.7  0.1 - 1.0 K/uL Final  . Eosinophils Relative 08/08/2019 1  % Final  . Eosinophils Absolute 08/08/2019 0.1  0.0 - 0.5 K/uL Final  . Basophils Relative 08/08/2019 0  % Final  . Basophils Absolute 08/08/2019 0.0  0.0 - 0.1 K/uL Final  . Immature Granulocytes 08/08/2019 2  % Final    . Abs Immature Granulocytes 08/08/2019 0.15* 0.00 - 0.07 K/uL Final   Performed at [facility], [address], [place], [place] [address]    Assessment:  Rajohn Henery is a 69 y.o. male with metastatichigh-grade adenocarcinomaof the right lungs/p CT-guided biopsy of a RLL lung mass on 05/11/2018. Pathologyrevealed an invasive high-grade adenocarcinoma, with predominantly solid growth pattern. The neoplastic cells were TTF-1 (+), Napsin A (+), and P40 (+). He has a T4 vertebral metastasis. Clinical stage is T4N1M1.  There was not enough material for Foundation One testing. PDL-1revealed TPS 90%.  PET scanon 04/30/2018 revealed a 3.4 cm hypermetabolic RLL pulmonary mass (SUV 13.2), 11 mm pulmonary nodule in the LEFT lung (SUV 4.5), RIGHT paratracheal lymph node (SUV 5.1), and hypermetabolic activity within the T4 vertebral body (SUV 10.2).   Thoracic spine MRIon 05/03/2018 revealed T4 metastasiswith a 40% pathologic compression deformity and 5 mm of retropulsion of the vertebral body. Retropulsion results in mild spinal canal stenosis and mild bilateral C4-5 foraminal stenosis. There was paravertebral soft tissue thickening from mid T3 to mid T5, likely representing edema related to the pathologic compression deformity vs. possible extraosseous extension of the neoplasm. There were no additional thoracic spinal metastases noted.   Head MRIon 05/03/2018 revealed no intracranial metastatic disease. 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 Incidental mention made of mild paranasal sinus disease.  Hereceived 11cycles ofpembrolizumab(05/24/2018 - 02/07/2019). He toleratedtreatment well. CEAwas 1.3 on 08/30/2018.LDHwas 165 on 11/29/2018.  He completed T4 radiationon 07/07/2018.  He receives Xgevamonthly (06/03/2018 -07/18/2019).  He received 1 cycle of carboplatin, Alimta, and pembrolizumabon 02/08/2019. Cycle #1 was complicated by nausea, vomiting, and diarrhea necessitating hospitalization.Decision made to hold carboplatin with his second dose.He iss/p2cycles ofAlimta and pembrolizumab (02/28/2019 - 03/29/2019).Cycle #2 was complicated by nausea, vomiting, and dehydration requring fluids in clinic and an overnight stay in the hospital. Hereceivedcarboplatin (AUC 2), Alimta, and pebrolizumab(04/18/2019).He tolerated it poorly.   He received cycle #6 Alimta and pembrolizumabon 05/09/2019(last 07/18/2019). He tolerated well. He receives B12every 9 weeks (last 05/25/2019).  He hascancer-related painin T4. He is currently taking Fentanyl75 mcg/hr and oxycodone10 mg every 4 hours prn.  Cervical and thoracic spine CT [facility]10/2019 revealed unchanged severe compression deformity/vertebral plana of T4 with a stable degree of retropulsion and focal mild spinal canal stenosis. There was interval enlargement  of a right lower lobe pulmonary mass(4.5 x 3.5 cm compared to 3.9 x 2.9 cm)with redemonstrated spiculated pulmonary nodules.  Chest, abdomen, pelvisCTon 01/28/2019 revealed slight interval enlargement of a right lower lobe mass with central necrosis measuring 4.6 x 3.4 cm, previously 4.0 x 3.0 cm. There was no change in right upper lobe nodules(1.4 and 0.8 cm).Therewas almost no residua of left lung nodules, with irregular opacities in the apical left upper lobeandsuperior segment left lower lobe. There was no change in right hilar soft tissue and lymph nodes.There wasvertebra plana deformity of T4andno evidence of new osseous metastatic disease. No evidence of distant metastatic disease in the abdomen or pelvis.The1.6 x 1.1 cmleft adrenal nodule(non-metabolic on prior PET scan),was unchanged.  Abdomen and pelvis CTon  04/23/2019 revealed no acute abdominal or pelvic pathology. There was fluid in the colon which can be seen with diarrhea. There was diverticulosis without evidence of diverticulitis. There was a stable 1.5 cm left adrenal nodule.  Chest, abdomen, and pelvisCTon 10/16/2020revealed a positive response to interval therapy for the known right lower lobe lung carcinoma(4.6 x 3.4 cm to3.1 x 2.8 cm transversely). There hadbeen a mild interval decrease contiguous soft tissue that extends along the right hilum. The two right upper lobe noduleswerestable from the most recent prior study (smaller than 04/27/2018).There are no new lung nodules.Therewasno evidence of new metastatic disease.There was stable severe compression deformity/vertebra plana of T4.Therewasno evidence of other osseous metastatic disease.There was a stable left adrenal nodule consistent with an adenoma.  ChestCT angiogramon 05/30/2019 revealed a tiny subsegmental pulmonary embolusin the right lower lobe. There areas of ground-glass at the periphery of the right chest hadbecome more conspicuous. This mayrelate to mild pneumonitis, perhaps due to radiation, attention on follow-up.The dominant nodule in the right lung basewasstable in size. Heis onEliquis.  Hehad transient renal insufficiencyon 03/23/2019. Creatinine was 1.32 (baseline 0.61-1.0). Renal ultrasoundon 03/24/2019 revealed no acute abnormality identified.There was no hydronephrosis or bladder distention.Creatinine is 0.82 today.  Stool waspositive for C difficile + diarrheaon 06/14/2019.Hebeganoral vancomycinbeginning11/27/2020.  He received the influenza vaccineon 05/16/2019.  Symptomatically, he has neck pain.  Exam is otherwise normal.  Plan: 1.   Labs today: CBC with diff, CMP, Mg, TSH, free T4. 2. Metastatic high-grade adenocarcinoma the RIGHT lung He is s/p 11 cycles of pembrolizumab. He is s/p 1 cycle of  carboplatin, Almita, and pembrolizumab (02/08/2019). He is s/p 1 cycle of carboplatin (AUC 2), Alimta, and pembrolizumab (04/18/2019). He is s/p6cycles of Alimta and pembrolizumab (02/28/2019 - 03/29/2019; 05/09/2019- 07/18/2019). He continues treatment without carboplatin. Labs reviewed.Begincycle #7Alimta and pembrolizumab. Patient took Decadron. Continue B12 every 9 weeks (last 07/18/2019; next due 09/19/2019). Continue folic acid. Discuss symptom management.  Hehas antiemetics and pain medications at home to use on a prn bases.  Interventions are adequate.    3. Nausea and vomiting post chemotherapy Hehasstruggled withchemotherapy includingcarboplatin. HehasreceivedIVF post chemotherapy.   Review of records notes no IVF needed with last cycle. Patient to contact clinic if IVF needed. 4. Bone metastasis [facility]. Last Xgeva on12/28/2020. Next Xgeva due 08/15/2019. 5. Cancer related pain Painhas been poorly controlled secondary to cervical neck pain. Continue Fentanyl75 mcg patch and oxycodone 10 mg p.o. every 4 hours as needed pain. 6. Cervical spine pain  Etiology unclear.  No prior evidence of metastasis.  Cervical spine MRI on 08/11/2018.  Schedule appt with Dr Izora Ribas (message left with Dr Izora Ribas). 7.   Anxiety Continue low dose Xanax.  Refill Xanax 0.5  mg po TID prn (dis: #60). 8.Pulmonary embolism Chest CT angiogram on 05/30/2019 confirmed a subsegmental pulmonary embolism. Continue Eliquis. 9.Normocytic anemia Hematocrit30.7. Hemoglobin 9.6. MCV 91.1today. Ferritin 687 with an iron saturation of 26% and a TIBC of 335 on 05/30/2019. Etiology  is secondary to chemotherapy induced anemia. Retacritevery 2 weeks per protocol. 10. RTC on 08/15/2019 for labs (BMP) and Xgeva. 11. RTC in 3 weeks for MD assessment, labs (CBC with diff, CMP, Mg, TSH, free T4), and cycle #8 Alimta and pembrolizumab.  I discussed the assessment and treatment plan with the patient.  The patient was provided an opportunity to ask questions and all were answered.  The patient agreed with the plan and demonstrated an understanding of the instructions.  The patient was advised to call back if the symptoms worsen or if the condition fails to improve as anticipated.  I provided 21 minutes (10:25 AM - 10:45 AM) of face-to-face time during this this encounter and > 50% was spent counseling as documented under my assessment and plan.    Lequita Asal, MD, PhD    08/08/2019, 10:45 AM  I, Samul Dada, am acting as a scribe for Lequita Asal, MD.  I, Alberton Mike Gip, MD, have reviewed the above documentation for accuracy and completeness, and I agree with the above.

## 2019-08-08 ENCOUNTER — Other Ambulatory Visit: Payer: Self-pay

## 2019-08-08 ENCOUNTER — Inpatient Hospital Stay (HOSPITAL_BASED_OUTPATIENT_CLINIC_OR_DEPARTMENT_OTHER): Payer: Medicare Other | Admitting: Hematology and Oncology

## 2019-08-08 ENCOUNTER — Inpatient Hospital Stay: Payer: Medicare Other

## 2019-08-08 ENCOUNTER — Telehealth: Payer: Self-pay

## 2019-08-08 VITALS — BP 105/64 | HR 82 | Temp 97.5°F | Resp 18

## 2019-08-08 VITALS — BP 117/77 | HR 89 | Temp 98.2°F | Resp 18 | Wt 138.1 lb

## 2019-08-08 DIAGNOSIS — G893 Neoplasm related pain (acute) (chronic): Secondary | ICD-10-CM

## 2019-08-08 DIAGNOSIS — Z5111 Encounter for antineoplastic chemotherapy: Secondary | ICD-10-CM | POA: Diagnosis not present

## 2019-08-08 DIAGNOSIS — I2699 Other pulmonary embolism without acute cor pulmonale: Secondary | ICD-10-CM

## 2019-08-08 DIAGNOSIS — D6481 Anemia due to antineoplastic chemotherapy: Secondary | ICD-10-CM | POA: Diagnosis not present

## 2019-08-08 DIAGNOSIS — Z923 Personal history of irradiation: Secondary | ICD-10-CM | POA: Diagnosis not present

## 2019-08-08 DIAGNOSIS — F419 Anxiety disorder, unspecified: Secondary | ICD-10-CM

## 2019-08-08 DIAGNOSIS — C7951 Secondary malignant neoplasm of bone: Secondary | ICD-10-CM | POA: Diagnosis not present

## 2019-08-08 DIAGNOSIS — C3431 Malignant neoplasm of lower lobe, right bronchus or lung: Secondary | ICD-10-CM

## 2019-08-08 DIAGNOSIS — M542 Cervicalgia: Secondary | ICD-10-CM

## 2019-08-08 DIAGNOSIS — T451X5A Adverse effect of antineoplastic and immunosuppressive drugs, initial encounter: Secondary | ICD-10-CM | POA: Diagnosis not present

## 2019-08-08 DIAGNOSIS — Z5112 Encounter for antineoplastic immunotherapy: Secondary | ICD-10-CM | POA: Diagnosis not present

## 2019-08-08 DIAGNOSIS — D649 Anemia, unspecified: Secondary | ICD-10-CM | POA: Diagnosis not present

## 2019-08-08 LAB — COMPREHENSIVE METABOLIC PANEL
ALT: 11 U/L (ref 0–44)
AST: 21 U/L (ref 15–41)
Albumin: 3.9 g/dL (ref 3.5–5.0)
Alkaline Phosphatase: 71 U/L (ref 38–126)
Anion gap: 10 (ref 5–15)
BUN: 16 mg/dL (ref 8–23)
CO2: 22 mmol/L (ref 22–32)
Calcium: 9.3 mg/dL (ref 8.9–10.3)
Chloride: 104 mmol/L (ref 98–111)
Creatinine, Ser: 1.16 mg/dL (ref 0.61–1.24)
GFR calc Af Amer: >60 mL/min (ref >60)
GFR calc non Af Amer: >60 mL/min (ref >60)
Glucose, Bld: 148 mg/dL — ABNORMAL HIGH (ref 70–99)
Potassium: 3.5 mmol/L (ref 3.5–5.1)
Sodium: 136 mmol/L (ref 135–145)
Total Bilirubin: 1 mg/dL (ref 0.3–1.2)
Total Protein: 7.3 g/dL (ref 6.5–8.1)

## 2019-08-08 LAB — T4, FREE: Free T4: 1.2 ng/dL — ABNORMAL HIGH (ref 0.61–1.12)

## 2019-08-08 LAB — CBC WITH DIFFERENTIAL/PLATELET
Abs Immature Granulocytes: 0.15 10*3/uL — ABNORMAL HIGH (ref 0.00–0.07)
Basophils Absolute: 0 10*3/uL (ref 0.0–0.1)
Basophils Relative: 0 %
Eosinophils Absolute: 0.1 10*3/uL (ref 0.0–0.5)
Eosinophils Relative: 1 %
HCT: 30.7 % — ABNORMAL LOW (ref 39.0–52.0)
Hemoglobin: 9.6 g/dL — ABNORMAL LOW (ref 13.0–17.0)
Immature Granulocytes: 2 %
Lymphocytes Relative: 11 %
Lymphs Abs: 1 10*3/uL (ref 0.7–4.0)
MCH: 28.5 pg (ref 26.0–34.0)
MCHC: 31.3 g/dL (ref 30.0–36.0)
MCV: 91.1 fL (ref 80.0–100.0)
Monocytes Absolute: 0.7 10*3/uL (ref 0.1–1.0)
Monocytes Relative: 7 %
Neutro Abs: 7.2 10*3/uL (ref 1.7–7.7)
Neutrophils Relative %: 79 %
Platelets: 495 10*3/uL — ABNORMAL HIGH (ref 150–400)
RBC: 3.37 MIL/uL — ABNORMAL LOW (ref 4.22–5.81)
RDW: 15.6 % — ABNORMAL HIGH (ref 11.5–15.5)
WBC: 9 10*3/uL (ref 4.0–10.5)
nRBC: 0 % (ref 0.0–0.2)

## 2019-08-08 LAB — MAGNESIUM: Magnesium: 2.1 mg/dL (ref 1.7–2.4)

## 2019-08-08 LAB — TSH: TSH: 0.59 u[IU]/mL (ref 0.350–4.500)

## 2019-08-08 MED ORDER — SODIUM CHLORIDE 0.9 % IV SOLN
500.0000 mg/m2 | Freq: Once | INTRAVENOUS | Status: AC
  Administered 2019-08-08: 12:00:00 900 mg via INTRAVENOUS
  Filled 2019-08-08: qty 20

## 2019-08-08 MED ORDER — SODIUM CHLORIDE 0.9% FLUSH
10.0000 mL | INTRAVENOUS | Status: DC | PRN
  Administered 2019-08-08: 10:00:00 10 mL via INTRAVENOUS
  Filled 2019-08-08: qty 10

## 2019-08-08 MED ORDER — SODIUM CHLORIDE 0.9 % IV SOLN
Freq: Once | INTRAVENOUS | Status: AC
  Filled 2019-08-08: qty 250

## 2019-08-08 MED ORDER — ALPRAZOLAM 0.5 MG PO TABS: 0.5000 mg | ORAL_TABLET | Freq: Three times a day (TID) | ORAL | 0 refills | Status: DC | PRN

## 2019-08-08 MED ORDER — HEPARIN SOD (PORK) LOCK FLUSH 100 UNIT/ML IV SOLN
INTRAVENOUS | Status: AC
  Filled 2019-08-08: qty 5

## 2019-08-08 MED ORDER — PALONOSETRON HCL INJECTION 0.25 MG/5ML
0.2500 mg | Freq: Once | INTRAVENOUS | Status: AC
  Administered 2019-08-08: 0.25 mg via INTRAVENOUS
  Filled 2019-08-08: qty 5

## 2019-08-08 MED ORDER — SODIUM CHLORIDE 0.9 % IV SOLN
200.0000 mg | Freq: Once | INTRAVENOUS | Status: AC
  Administered 2019-08-08: 12:00:00 200 mg via INTRAVENOUS
  Filled 2019-08-08: qty 8

## 2019-08-08 MED ORDER — HEPARIN SOD (PORK) LOCK FLUSH 100 UNIT/ML IV SOLN
500.0000 [IU] | Freq: Once | INTRAVENOUS | Status: AC
  Administered 2019-08-08: 500 [IU] via INTRAVENOUS
  Filled 2019-08-08: qty 5

## 2019-08-08 MED ORDER — FOLIC ACID 1 MG PO TABS: 1.0000 mg | ORAL_TABLET | Freq: Every day | ORAL | 3 refills | Status: DC

## 2019-08-08 NOTE — Telephone Encounter (Signed)
Spoke with Patty at Dr. Nelly Laurence office regarding appt for patient. Patty states patient is scheduled for Wednesday, 08/10/2019 at 330. Patient made aware of appt.

## 2019-08-08 NOTE — Progress Notes (Signed)
Patient here for follow up. Reports he is still having issues with neck pain that started last Thursday. Unsure if Flexeril is helping. Reports he is trying to sleep as much as possible to avoid the pain. Patient requesting refill for Xanax and Folic Acid.

## 2019-08-10 ENCOUNTER — Telehealth: Payer: Self-pay

## 2019-08-10 DIAGNOSIS — M47812 Spondylosis without myelopathy or radiculopathy, cervical region: Secondary | ICD-10-CM | POA: Diagnosis not present

## 2019-08-10 DIAGNOSIS — M542 Cervicalgia: Secondary | ICD-10-CM | POA: Diagnosis not present

## 2019-08-10 NOTE — Telephone Encounter (Signed)
SW attempted to contact patient to schedule palliative care visit. No answer. SW left VM with contact information.

## 2019-08-14 ENCOUNTER — Other Ambulatory Visit: Payer: Self-pay

## 2019-08-14 ENCOUNTER — Ambulatory Visit
Admission: RE | Admit: 2019-08-14 | Discharge: 2019-08-14 | Disposition: A | Discharge status: Home / Self Care | Payer: Medicare Other | Source: Ambulatory Visit | Attending: Hematology and Oncology | Admitting: Hematology and Oncology

## 2019-08-14 DIAGNOSIS — M542 Cervicalgia: Secondary | ICD-10-CM

## 2019-08-14 DIAGNOSIS — C7951 Secondary malignant neoplasm of bone: Secondary | ICD-10-CM | POA: Insufficient documentation

## 2019-08-14 DIAGNOSIS — M4802 Spinal stenosis, cervical region: Secondary | ICD-10-CM | POA: Diagnosis not present

## 2019-08-14 DIAGNOSIS — C3490 Malignant neoplasm of unspecified part of unspecified bronchus or lung: Secondary | ICD-10-CM | POA: Diagnosis not present

## 2019-08-14 MED ORDER — GADOBUTROL 1 MMOL/ML IV SOLN
6.0000 mL | Freq: Once | INTRAVENOUS | Status: AC | PRN
  Administered 2019-08-14: 6 mL via INTRAVENOUS

## 2019-08-15 ENCOUNTER — Telehealth: Payer: Self-pay

## 2019-08-15 ENCOUNTER — Inpatient Hospital Stay: Payer: Medicare Other

## 2019-08-15 DIAGNOSIS — I2699 Other pulmonary embolism without acute cor pulmonale: Secondary | ICD-10-CM

## 2019-08-15 DIAGNOSIS — D6481 Anemia due to antineoplastic chemotherapy: Secondary | ICD-10-CM

## 2019-08-15 DIAGNOSIS — C7951 Secondary malignant neoplasm of bone: Secondary | ICD-10-CM

## 2019-08-15 DIAGNOSIS — T451X5A Adverse effect of antineoplastic and immunosuppressive drugs, initial encounter: Secondary | ICD-10-CM

## 2019-08-15 DIAGNOSIS — C3431 Malignant neoplasm of lower lobe, right bronchus or lung: Secondary | ICD-10-CM

## 2019-08-15 DIAGNOSIS — Z5112 Encounter for antineoplastic immunotherapy: Secondary | ICD-10-CM

## 2019-08-15 DIAGNOSIS — D649 Anemia, unspecified: Secondary | ICD-10-CM | POA: Diagnosis not present

## 2019-08-15 DIAGNOSIS — Z923 Personal history of irradiation: Secondary | ICD-10-CM | POA: Diagnosis not present

## 2019-08-15 DIAGNOSIS — Z5111 Encounter for antineoplastic chemotherapy: Secondary | ICD-10-CM | POA: Diagnosis not present

## 2019-08-15 LAB — BASIC METABOLIC PANEL
Anion gap: 9 (ref 5–15)
BUN: 17 mg/dL (ref 8–23)
CO2: 25 mmol/L (ref 22–32)
Calcium: 8.6 mg/dL — ABNORMAL LOW (ref 8.9–10.3)
Chloride: 100 mmol/L (ref 98–111)
Creatinine, Ser: 1.09 mg/dL (ref 0.61–1.24)
GFR calc Af Amer: >60 mL/min (ref >60)
GFR calc non Af Amer: >60 mL/min (ref >60)
Glucose, Bld: 133 mg/dL — ABNORMAL HIGH (ref 70–99)
Potassium: 4 mmol/L (ref 3.5–5.1)
Sodium: 134 mmol/L — ABNORMAL LOW (ref 135–145)

## 2019-08-15 NOTE — Telephone Encounter (Signed)
SW attempted to contact patient to schedule palliative care visit. No answer. SW left VM with contact information.

## 2019-08-15 NOTE — Progress Notes (Signed)
Corrected calcium level below 8.9 today (8.6) no xgeva given per MD. Stressed importance of taking his 2 OTC calcium 600mg  tabs daily. Patient admits he forgets to take them. Will re-evaluate giving xgeva at next MD appt.

## 2019-08-18 ENCOUNTER — Other Ambulatory Visit: Payer: Self-pay | Admitting: *Deleted

## 2019-08-18 ENCOUNTER — Telehealth: Payer: Self-pay | Admitting: *Deleted

## 2019-08-18 MED ORDER — FLUOXETINE HCL 40 MG PO CAPS: 40.0000 mg | ORAL_CAPSULE | Freq: Every day | ORAL | 3 refills | Status: DC

## 2019-08-18 MED ORDER — OXYCODONE HCL 20 MG PO TABS: ORAL_TABLET | ORAL | 0 refills | Status: DC

## 2019-08-18 NOTE — Telephone Encounter (Signed)
  He wasn't taking on 08/08/2019 and his potassium level was good.  No refill needed.  M

## 2019-08-18 NOTE — Telephone Encounter (Signed)
Pt called in to request refill of potassium. Last potassium check was 4.0 on 08/15/19. Do you want pt to continue potassium?

## 2019-08-19 ENCOUNTER — Telehealth: Payer: Self-pay

## 2019-08-19 NOTE — Telephone Encounter (Signed)
Telephone call to patient to schedule palliative care visit.  Patient did not answer phone, RN left message requesting call back to schedule palliative care visit.

## 2019-08-19 NOTE — Telephone Encounter (Signed)
Thank you! Pt has been made aware.

## 2019-08-24 ENCOUNTER — Other Ambulatory Visit: Payer: Self-pay

## 2019-08-25 ENCOUNTER — Other Ambulatory Visit: Payer: Self-pay | Admitting: *Deleted

## 2019-08-25 ENCOUNTER — Inpatient Hospital Stay: Payer: Medicare Other | Attending: Hospice and Palliative Medicine | Admitting: Nurse Practitioner

## 2019-08-25 DIAGNOSIS — D649 Anemia, unspecified: Secondary | ICD-10-CM | POA: Insufficient documentation

## 2019-08-25 DIAGNOSIS — C7951 Secondary malignant neoplasm of bone: Secondary | ICD-10-CM | POA: Insufficient documentation

## 2019-08-25 DIAGNOSIS — E876 Hypokalemia: Secondary | ICD-10-CM | POA: Insufficient documentation

## 2019-08-25 DIAGNOSIS — Z923 Personal history of irradiation: Secondary | ICD-10-CM | POA: Insufficient documentation

## 2019-08-25 DIAGNOSIS — F419 Anxiety disorder, unspecified: Secondary | ICD-10-CM

## 2019-08-25 DIAGNOSIS — Z515 Encounter for palliative care: Secondary | ICD-10-CM | POA: Diagnosis not present

## 2019-08-25 DIAGNOSIS — Z87891 Personal history of nicotine dependence: Secondary | ICD-10-CM | POA: Insufficient documentation

## 2019-08-25 DIAGNOSIS — G893 Neoplasm related pain (acute) (chronic): Secondary | ICD-10-CM | POA: Diagnosis not present

## 2019-08-25 DIAGNOSIS — Z79899 Other long term (current) drug therapy: Secondary | ICD-10-CM | POA: Insufficient documentation

## 2019-08-25 DIAGNOSIS — C3431 Malignant neoplasm of lower lobe, right bronchus or lung: Secondary | ICD-10-CM | POA: Insufficient documentation

## 2019-08-25 DIAGNOSIS — E871 Hypo-osmolality and hyponatremia: Secondary | ICD-10-CM | POA: Insufficient documentation

## 2019-08-25 NOTE — Telephone Encounter (Signed)
Pt requesting refill of xanax and fentanyl patches. He is scheduled for tele visit today with Josh.

## 2019-08-25 NOTE — Progress Notes (Signed)
Virtual Visit via Telephone Note  I connected with Jimmy West on 08/25/19 at  3:30 PM EST by telephone and verified that I am speaking with the correct person using two identifiers.   I discussed the limitations, risks, security and privacy concerns of performing an evaluation and management service by telephone and the availability of in person appointments. I also discussed with the patient that there may be a patient responsible charge related to this service. The patient expressed understanding and agreed to proceed.   History of Present Illness:  Palliative care follow-up for this 69 year old male with multiple medical problems including presumed metastatic lung cancer. Initially,patient presented withpulmonary nodules from chest x-ray ordered by PCP. Subsequently referred to oncology for work-up and was evaluated by Dr. Mike Gip on 04/30/2018. PET scan revealed several hypermetabolic pulmonary nodules and activity within the T4 vertebral body. Follow-up MRI of the spine revealed T4 metastasis with pathological compression deformity and mild spinal canal stenosis with probable edema and extraosseous extension of neoplasm from mid T3 to mid T4. He was referred to palliative care clinic to assist with symptom management and to establish treatment goals.   Observations/Objective: I called patient and we spoke by phone.  Prior to appointment I reviewed interval progress notes and imaging.  Patient last saw palliative care on 05/17/2019.  At that time, patient was referred for home palliative care and has been seen once. Was treated for c diff toxigenic infection in November 2020. He was seen by Dr. Paticia Stack and started on inhalers. 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 He has a known T4 metastatic lesion and is currently on monthly Xgeva. He was referred to [facility] neurosurgery/Dr. Izora Ribas for cervical neck pain which started in 2018 and has occurred intermittently since that  time. [facility] ER for this on 07/30/19. Saw [facility] and received steroids, flexeril, and pain medication. MRI was negative for metastatic disease and showed spondylosis and stenosis. Known T4 metastatic lesion was stable. Saw neurosurg/Dr. Izora Ribas recommended PT, physiatry, and/or epidural injections. Patient declined. He continues alimta and [place] given with palliative intent. Prior imaging showed stable disease.   Patient says that he has chronic cancer related pain at site of metastatic disease/T4 and pleuritic left sided pain. Says his breathing is 'fine' and that pain has been well controlled. Hasn't needed prn inhaler. His neck bothers him more than 'other pain'. Hasn't been back to Dr. Izora Ribas. Concerned of effects of steroids (worsened anxiety) and hasn't noticed improvement with at home exercises. Hasn't taken flexeril in 'a while'. Last fill of fentanyl was October 2020 and patient says he doesn't take as he doesn't have pain every day. Takes xanax for sleep. Has had trouble sleeping since he stopped working. Has daytime fatigue and night time restlessness. Not physically active which he says is due to weather. He denies worsening depressive symptoms. Emotional coping and physical activity encouraged. Offered referral to outpatient OT/PT with maureen which he declined. Discussed role of physical activity on sleep and fatigue. Denies opioid induced constipation.    Assessment and Plan: - Continue fluoxetine 40mg  daily - Continue xanax 0.5 TID for anxiety; pdmp reviewed- refill post dated for 2/7 - Hold fentanyl (pt not taking- last fill 05/18/19) - Continue oxycodone 10-20mg  Q4H prn. Last fill 1/28 - Continue bowel prophylaxis  - Follow up with Dr. Izora Ribas for neck pain  Follow Up Instructions: Follow up with Palliative Care in [place] on 3/2 for face to face visit   I discussed the assessment and  treatment plan with the patient. The patient was provided an opportunity to ask questions and all  were answered. The patient agreed with the plan and demonstrated an understanding of the instructions.   The patient was advised to call back or seek an in-person evaluation if the symptoms worsen or if the condition fails to improve as anticipated.  I provided 26 minutes of non-face-to-face time during this encounter.   Verlon Au, NP

## 2019-08-26 ENCOUNTER — Other Ambulatory Visit: Payer: Self-pay

## 2019-08-26 ENCOUNTER — Telehealth: Payer: Self-pay | Admitting: *Deleted

## 2019-08-26 DIAGNOSIS — Z515 Encounter for palliative care: Secondary | ICD-10-CM | POA: Insufficient documentation

## 2019-08-26 DIAGNOSIS — F419 Anxiety disorder, unspecified: Secondary | ICD-10-CM

## 2019-08-26 MED ORDER — ALPRAZOLAM 0.5 MG PO TABS: 0.5000 mg | ORAL_TABLET | Freq: Three times a day (TID) | ORAL | 0 refills | Status: DC | PRN

## 2019-08-26 NOTE — Telephone Encounter (Signed)
Pt had virtual visit with Beckey Rutter, NP who prescribed xanax. Fentanyl patches discussed since has not had filled since October. Per pt he has not been taking consistently so fentanyl patches were discontinued and pt advised to continue taking oxycodone.

## 2019-08-26 NOTE — Telephone Encounter (Signed)
Patient and I spoke yesterday for palliative care visit. Pain was stable at that time. He hasn't been taking fentanyl patches and prefers to continue oxycodone as prescribed. Neck pain doesn't appear to be related to known malignancy, he's had recent imaging including MRI, and I encouraged him to follow back up with Dr Cari Caraway if he would like to consider injections, pt, or physiatry as recommended by Dr Cari Caraway.

## 2019-08-26 NOTE — Telephone Encounter (Addendum)
Patient friend called reporting that patient is having severe neck pain and wants to know what else can be done for him. She asks we call patient back at [phone]. If not able to reach him, then call her at [phone]. Patient did have telephone visit with Lauren yesterday and note stated that pain was stable.

## 2019-08-26 NOTE — Telephone Encounter (Signed)
I had attempted to contact Jimmy West, but it went to voice mail and said that voice mail bax was full Call returned to patient and he was advised to contact Dr Melvenia Beam office for his pain. He states that he had been given IV ms and steroids at our office before and was wanting this again. I reiterated that per NP he needed to get in touch with Dr Cari Caraway for injections or physiatry. He hung up on me. Jimmy West called me back while on phone with patient. I returned her call and I informed her that patient needs to contact Dr Nelly Laurence office for this and that I believe he got upset with me because he hung up on me. She will contact patient to get Dr Nelly Laurence number

## 2019-08-28 ENCOUNTER — Encounter: Payer: Self-pay | Admitting: Hematology and Oncology

## 2019-08-29 ENCOUNTER — Inpatient Hospital Stay: Payer: Medicare Other | Admitting: Hematology and Oncology

## 2019-08-29 ENCOUNTER — Encounter: Payer: Self-pay | Admitting: Hematology and Oncology

## 2019-08-29 ENCOUNTER — Inpatient Hospital Stay: Payer: Medicare Other

## 2019-08-29 NOTE — Progress Notes (Signed)
[facility]  [address] [place], [place] [address] Phone: [phone]  Fax: [phone]   Clinic Day:  08/30/2019  Referring physician: [name], NP  Chief Complaint: Jimmy West is a 69 y.o. male with metastatic adenocarcinoma of the lung who is seen for assessment prior to cycle #8 Alimta and pembrolizumab and continuation of monthly Xgeva.   HPI: The patient was last seen in the medical oncology clinic on 08/08/2019. At that time, he had neck pain.  Exam was otherwise normal.  Hematocrit 30.7, hemoglobin 9.6, platelets 495,000, WBC 9,000. CMP was normal.  TSH was 0.590 and free T4 was 1.20.  Magnesium 2.1. We discussed a cervical spine MRI. Patient received cycle #7 Alimta and pembrolizumab. He continued on Eliquis. He continued on Fentanyl 75 mcg patch and oxycodone 10 mg p.o. every 4 hours prn.   Patient had an initial consult with Dr. Izora Ribas on 08/10/2019 at [facility]. He described neck pain that radiated at the top and back of his head and ears.   Cervical spine MRI on 08/14/2019 showed no evidence of metastatic disease within the cervical spine.   He had a virtual visit with Beckey Rutter, NP on 08/25/2019 for palliative care. His chronic pain was stable. He noted intermittent neck pain. Last refill of Fentanyl was 05/18/2019; he said that he didn't take as he doesn't have pain every day.  His breathing was fine. He had daytime fatigue and night time restlessness.  His Fentanyl was held.  He continued oxycodone 10-20 mg po q 4 hrs prn pain. He was advised to follow up with Dr. Izora Ribas for neck pain.   During the interim, he reported "I'm here".  He is feeling down. He notes bilateral shoulder pain and neck radiating to the back of his head pain (7/10). Certain movements bring about more pain that radiates further down his body.  Exercises recommended by Dr. Izora Ribas have given him mild relief. He is not on his Fentanyl patch and  they were not sticking well. On regular days,  he take three 20 mg oxycodone HCL tablets and on bad days he takes about 5 or 6 tablets. He remains on Eliquis. He is taking calcium as recommended.  He is currently not working. He notes his friend is back at their old shop (Human resources officer); she is working full time now.    Past Medical History:  Diagnosis Date  . Bulging lumbar disc   . Cancer ([place])    stage 4 lung cancer  . Heart attack [facility])     Past Surgical History:  Procedure Laterality Date  . CARDIAC CATHETERIZATION     two stents  . KNEE SURGERY Left   . PORTA CATH INSERTION N/A 05/21/2018   Procedure: PORTA CATH INSERTION;  Surgeon: Algernon Huxley, MD;  Location: [facility];  Service: Cardiovascular;  Laterality: N/A;    Family History  Problem Relation Age of Onset  . Heart failure Father   . Cancer Maternal Aunt   . Heart failure Maternal Uncle   . Dementia Paternal Grandmother   . Cancer Maternal Aunt     Social History:  reports that he quit smoking about 15 years ago. His smoking use included cigarettes. He has a 22.50 pack-year smoking history. He has never used smokeless tobacco. He reports current alcohol use of about 15.0 standard drinks of alcohol per week. He reports that he does not use drugs. He started smoking at age 71.  He is smoking 1 1/2 packs/day. Patient denies known exposures to radiation ortoxins. 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 He has not been working recently as the salon was closed. He plays golf occasionally.He has 8 cats. The patient is alone today.  Allergies: No Known Allergies  Current Medications: Current Outpatient Medications  Medication Sig Dispense Refill  . acetaminophen (TYLENOL) 500 MG tablet Take 500 mg by mouth 3 (three) times daily as needed.    Marland Kitchen albuterol (VENTOLIN HFA) 108 (90 Base) MCG/ACT inhaler Inhale 2 puffs into the lungs every 6 (six) hours as needed for wheezing or shortness of  breath. 18 g 3  . ALPRAZolam (XANAX) 0.5 MG tablet Take 1 tablet (0.5 mg total) by mouth 3 (three) times daily as needed for anxiety. 60 tablet 0  . apixaban (ELIQUIS) 5 MG TABS tablet Take 1 tablet ('5mg'$ ) twice daily 60 tablet 0  . Calcium 600-400 MG-UNIT CHEW Chew 2 tablets by mouth daily.    Marland Kitchen dexamethasone (DECADRON) 4 MG tablet Take 1 tab two times a day the day before Alimta chemo, then take 2 tabs once a day for 3 days starting the day after chemo. 30 tablet 1  . FLUoxetine (PROZAC) 40 MG capsule Take 1 capsule (40 mg total) by mouth daily. 30 capsule 3  . folic acid (FOLVITE) 1 MG tablet Take 1 tablet (1 mg total) by mouth daily. Start 5-7 days before Alimta chemotherapy. Continue until 21 days after Alimta completed. 100 tablet 3  . Oxycodone HCl 20 MG TABS TAKE 1/2 TO 1 TABLET BY MOUTH EVERY 4 HOURS AS NEEDED FOR SEVERE PAIN 90 tablet 0  . polyethylene glycol (MIRALAX / GLYCOLAX) packet Take 17 g by mouth daily.    Marland Kitchen senna (SENOKOT) 8.6 MG tablet Take 1 tablet by mouth as needed.     Marland Kitchen aspirin EC 81 MG tablet Take 81 mg by mouth daily.    Marland Kitchen ibuprofen (ADVIL,MOTRIN) 200 MG tablet Take 200 mg by mouth every 6 (six) hours as needed.    . ondansetron (ZOFRAN ODT) 4 MG disintegrating tablet Take 1 tablet (4 mg total) by mouth every 6 (six) hours as needed for nausea or vomiting. (Patient not taking: Reported on 08/08/2019) 30 tablet 1  . potassium chloride SA (KLOR-CON) 20 MEQ tablet Take 1 tablet (20 mEq total) by mouth 2 (two) times daily. (Patient not taking: Reported on 08/08/2019) 6 tablet 0   No current facility-administered medications for this visit.   Facility-Administered Medications Ordered in Other Visits  Medication Dose Route Frequency Provider Last Rate Last Admin  . 0.9 %  sodium chloride infusion   Intravenous Once ,  C, MD      . 0.9 %  sodium chloride infusion   Intravenous Once PRN ,  C, MD      . albuterol (PROVENTIL) (2.5 MG/3ML) 0.083%  nebulizer solution 2.5 mg  2.5 mg Nebulization Once PRN ,  C, MD      . alteplase (CATHFLO ACTIVASE) injection 2 mg  2 mg Intracatheter Once PRN Nolon Stalls C, MD      . EPINEPHrine (ADRENALIN) 1 MG/10ML injection 0.25 mg  0.25 mg Intravenous Once PRN ,  C, MD      . EPINEPHrine (ADRENALIN) 1 MG/10ML injection 0.25 mg  0.25 mg Intravenous Once PRN ,  C, MD      . heparin lock flush 100 unit/mL  500 Units Intracatheter Once PRN Lequita Asal, MD      .  heparin lock flush 100 unit/mL  250 Units Intracatheter Once PRN Nolon Stalls C, MD      . heparin lock flush 100 unit/mL  500 Units Intravenous Once ,  C, MD      . sodium chloride flush (NS) 0.9 % injection 10 mL  10 mL Intravenous PRN Lequita Asal, MD   10 mL at 03/04/19 0959  . sodium chloride flush (NS) 0.9 % injection 10 mL  10 mL Intracatheter Once PRN Nolon Stalls C, MD      . sodium chloride flush (NS) 0.9 % injection 10 mL  10 mL Intravenous PRN Lequita Asal, MD   10 mL at 08/30/19 0952  . sodium chloride flush (NS) 0.9 % injection 3 mL  3 mL Intracatheter Once PRN Lequita Asal, MD        Review of Systems  Constitutional: Positive for weight loss (2 lbs). Negative for chills, diaphoresis, fever and malaise/fatigue.       "I'm here".  HENT: Negative.  Negative for congestion, ear pain, nosebleeds, sinus pain and sore throat.   Eyes: Negative.  Negative for blurred vision, double vision and photophobia.  Respiratory: Negative for cough (chronic, improved), hemoptysis, sputum production, shortness of breath (exertional, improved) and wheezing.   Cardiovascular: Negative.  Negative for chest pain, palpitations, orthopnea, leg swelling and PND.  Gastrointestinal: Negative.  Negative for abdominal pain, blood in stool, constipation, diarrhea, melena, nausea and vomiting.  Genitourinary: Negative.  Negative for dysuria, flank pain, frequency,  hematuria and urgency.  Musculoskeletal: Positive for joint pain (bilateral shoulders) and neck pain (radiating to the back of his head (7/10)). Negative for back pain, falls and myalgias.  Skin: Negative.  Negative for rash.  Neurological: Negative for dizziness, tingling, tremors, sensory change, focal weakness, loss of consciousness, weakness and headaches.  Endo/Heme/Allergies: Negative.  Negative for environmental allergies. Does not bruise/bleed easily.  Psychiatric/Behavioral: Negative for depression and memory loss. The patient is nervous/anxious (chronic on Xanax). The patient does not have insomnia.        Feeling down.  All other systems reviewed and are negative.  Performance status (ECOG): 1-2  Vitals Blood pressure 105/70, pulse 86, temperature (!) 97.5 F (36.4 C), resp. rate 18, weight 136 lb 11 oz (62 kg), SpO2 99 %.   Physical Exam  Constitutional: He is oriented to person, place, and time. He appears well-developed and well-nourished. No distress.  HENT:  Head: Normocephalic and atraumatic.  Mouth/Throat: Oropharynx is clear and moist. No oropharyngeal exudate.  Thin brown graying hair.  Mustache.  Dentures.  Mask.  Eyes: Pupils are equal, round, and reactive to light. Conjunctivae and EOM are normal. No scleral icterus.  Glasses.  Gray blue eyes.  Neck: No JVD present.  Cardiovascular: Normal rate, regular rhythm and normal heart sounds. Exam reveals no gallop and no friction rub.  No murmur heard. Pulmonary/Chest: Effort normal and breath sounds normal. No respiratory distress. He has no wheezes. He has no rales.  Abdominal: Soft. Bowel sounds are normal. He exhibits no distension and no mass. There is no abdominal tenderness. There is no rebound and no guarding.  Musculoskeletal:        General: No tenderness or edema. Normal range of motion.     Cervical back: Normal range of motion and neck supple.  Lymphadenopathy:       Head (right side): No preauricular, no  posterior auricular and no occipital adenopathy present.       Head (left side):  No preauricular, no posterior auricular and no occipital adenopathy present.    He has no cervical adenopathy.    He has no axillary adenopathy.       Right: No inguinal and no supraclavicular adenopathy present.       Left: No inguinal and no supraclavicular adenopathy present.  Neurological: He is alert and oriented to person, place, and time. He has normal strength. He displays no tremor. No cranial nerve deficit. He exhibits normal muscle tone. Coordination normal.  Skin: Skin is warm and dry. No rash noted. He is not diaphoretic. No pallor.  Psychiatric: He has a normal mood and affect. His speech is normal and behavior is normal. Judgment and thought content normal. Cognition and memory are normal.  Nursing note and vitals reviewed.   Infusion on 08/30/2019  Component Date Value Ref Range Status  . Magnesium 08/30/2019 2.3  1.7 - 2.4 mg/dL Final   Performed at [facility], [address], [place], [place] [address]  . Sodium 08/30/2019 139  135 - 145 mmol/L Final  . Potassium 08/30/2019 3.5  3.5 - 5.1 mmol/L Final  . Chloride 08/30/2019 108  98 - 111 mmol/L Final  . CO2 08/30/2019 21* 22 - 32 mmol/L Final  . Glucose, Bld 08/30/2019 86  70 - 99 mg/dL Final  . BUN 08/30/2019 10  8 - 23 mg/dL Final  . Creatinine, Ser 08/30/2019 0.97  0.61 - 1.24 mg/dL Final  . Calcium 08/30/2019 8.8* 8.9 - 10.3 mg/dL Final  . Total Protein 08/30/2019 7.1  6.5 - 8.1 g/dL Final  . Albumin 08/30/2019 3.4* 3.5 - 5.0 g/dL Final  . AST 08/30/2019 22  15 - 41 U/L Final  . ALT 08/30/2019 13  0 - 44 U/L Final  . Alkaline Phosphatase 08/30/2019 66  38 - 126 U/L Final  . Total Bilirubin 08/30/2019 0.8  0.3 - 1.2 mg/dL Final  . GFR calc non Af Amer 08/30/2019 >60  >60 mL/min Final  . GFR calc Af Amer 08/30/2019 >60  >60 mL/min Final  . Anion gap 08/30/2019 10  5 - 15 Final   Performed at [facility], [address], [place], [place] [address]  . WBC 08/30/2019 7.0  4.0 - 10.5 K/uL Final  . RBC 08/30/2019 3.49* 4.22 - 5.81 MIL/uL Final  . Hemoglobin 08/30/2019 9.9* 13.0 - 17.0 g/dL Final  . HCT 08/30/2019 32.0* 39.0 - 52.0 % Final  . MCV 08/30/2019 91.7  80.0 - 100.0 fL Final  . MCH 08/30/2019 28.4  26.0 - 34.0 pg Final  . MCHC 08/30/2019 30.9  30.0 - 36.0 g/dL Final  . RDW 08/30/2019 16.0* 11.5 - 15.5 % Final  . Platelets 08/30/2019 482* 150 - 400 K/uL Final  . nRBC 08/30/2019 0.0  0.0 - 0.2 % Final  . Neutrophils Relative % 08/30/2019 59  % Final  . Neutro Abs 08/30/2019 4.2  1.7 - 7.7 K/uL Final  . Lymphocytes Relative 08/30/2019 19  % Final  . Lymphs Abs 08/30/2019 1.4  0.7 - 4.0 K/uL Final  . Monocytes Relative 08/30/2019 15  % Final  . Monocytes Absolute 08/30/2019 1.0  0.1 - 1.0 K/uL Final  . Eosinophils Relative 08/30/2019 5  % Final  . Eosinophils Absolute 08/30/2019 0.4  0.0 - 0.5 K/uL Final  . Basophils Relative 08/30/2019 1  % Final  . Basophils Absolute 08/30/2019 0.1  0.0 - 0.1 K/uL Final  . Immature Granulocytes 08/30/2019 1  % Final  .  Abs Immature Granulocytes 08/30/2019 0.06  0.00 - 0.07 K/uL Final   Performed at [facility], [address] [place], [place] [address]    Assessment:  Zephyr Ridley is a 69 y.o. male with metastatichigh-grade adenocarcinomaof the right lungs/p CT-guided biopsy of a RLL lung mass on 05/11/2018. Pathologyrevealed an invasive high-grade adenocarcinoma, with predominantly solid growth pattern. The neoplastic cells were TTF-1 (+), Napsin A (+), and P40 (+). He has a T4 vertebral metastasis. Clinical stage is T4N1M1.  There was not enough material for Foundation One testing. PDL-1revealed TPS 90%.  PET scanon 04/30/2018 revealed a 3.4 cm hypermetabolic RLL pulmonary mass (SUV 13.2), 11 mm pulmonary nodule in the LEFT lung (SUV 4.5), RIGHT paratracheal lymph node (SUV 5.1), and hypermetabolic activity  within the T4 vertebral body (SUV 10.2).   Thoracic spine MRIon 05/03/2018 revealed T4 metastasiswith a 40% pathologic compression deformity and 5 mm of retropulsion of the vertebral body. Retropulsion results in mild spinal canal stenosis and mild bilateral C4-5 foraminal stenosis. There was paravertebral soft tissue thickening from mid T3 to mid T5, likely representing edema related to the pathologic compression deformity vs. possible extraosseous extension of the neoplasm. There were no additional thoracic spinal metastases noted.   Head MRIon 05/03/2018 revealed no intracranial metastatic disease. 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 Incidental mention made of mild paranasal sinus disease.  Hereceived 11cycles ofpembrolizumab(05/24/2018 - 02/07/2019). He toleratedtreatment well. CEAwas 1.3 on 08/30/2018.LDHwas 165 on 11/29/2018.  He completed T4 radiationon 07/07/2018. He receives Xgevamonthly (06/03/2018 -07/18/2019).  He received 1 cycle of carboplatin, Alimta, and pembrolizumabon 02/08/2019. Cycle #1 was complicated by nausea, vomiting, and diarrhea necessitating hospitalization.Decision made to hold carboplatin with his second dose.He iss/p2cycles ofAlimta and pembrolizumab (02/28/2019 - 03/29/2019).Cycle #2 was complicated by nausea, vomiting, and dehydration requring fluids in clinic and an overnight stay in the hospital. Hereceivedcarboplatin (AUC 2), Alimta, and pebrolizumab(04/18/2019).He tolerated it poorly.   He receivedcycle #7Alimta and pembrolizumabon 05/09/2019(last 08/08/2019). He tolerated well. He receives B12every 9 weeks (last 07/18/2019).  He hascancer-related painin T4. He is currently taking Fentanyl75 mcg/hr and oxycodone10 mg every 4  hours prn.  Cervical and thoracic spine CT at [facility] 01/10/2019 revealed unchanged severe compression deformity/vertebral plana of T4 with a stable degree of retropulsion and focal mild spinal canal stenosis. There was interval enlargement of a right lower lobe pulmonary mass(4.5 x 3.5 cm compared to 3.9 x 2.9 cm)with redemonstrated spiculated pulmonary nodules.  Chest, abdomen, pelvisCTon 01/28/2019 revealed slight interval enlargement of a right lower lobe mass with central necrosis measuring 4.6 x 3.4 cm, previously 4.0 x 3.0 cm. There was no change in right upper lobe nodules(1.4 and 0.8 cm).Therewas almost no residua of left lung nodules, with irregular opacities in the apical left upper lobeandsuperior segment left lower lobe. There was no change in right hilar soft tissue and lymph nodes.There wasvertebra plana deformity of T4andno evidence of new osseous metastatic disease. No evidence of distant metastatic disease in the abdomen or pelvis.The1.6 x 1.1 cmleft adrenal nodule(non-metabolic on prior PET scan),was unchanged.  Abdomen and pelvis CTon 04/23/2019 revealed no acute abdominal or pelvic pathology. There was fluid in the colon which can be seen with diarrhea. There was diverticulosis without evidence of diverticulitis. There was a stable 1.5 cm left adrenal nodule.  Chest, abdomen, and pelvisCTon 10/16/2020revealed a positive response to  interval therapy for the known right lower lobe lung carcinoma(4.6 x 3.4 cm to3.1 x 2.8 cm transversely). There hadbeen a mild interval decrease contiguous soft tissue that extends along the right hilum. The two right upper lobe noduleswerestable from the most recent prior study (smaller than 04/27/2018).There are no new lung nodules.Therewasno evidence of new metastatic disease.There was stable severe compression deformity/vertebra plana of T4.Therewasno evidence of other osseous metastatic disease.There was a  stable left adrenal nodule consistent with an adenoma.  ChestCT angiogramon 05/30/2019 revealed a tiny subsegmental pulmonary embolusin the right lower lobe. There areas of ground-glass at the periphery of the right chest hadbecome more conspicuous. This mayrelate to mild pneumonitis, perhaps due to radiation, attention on follow-up.The dominant nodule in the right lung basewasstable in size. Heis onEliquis.  Cervical spine MRI on 08/14/2019 showed no evidence of metastatic disease within the cervical spine.  He has been diagnosed with cervicalgia by Dr Izora Ribas.  Hehad transient renal insufficiencyon 03/23/2019. Creatinine was 1.32 (baseline 0.61-1.0). Renal ultrasoundon 03/24/2019 revealed no acute abnormality identified.There was no hydronephrosis or bladder distention.Creatinine is 0.82 today.  Stool waspositive for C difficile + diarrheaon 06/14/2019.Hebeganoral vancomycinbeginning11/27/2020.  He received the influenza vaccineon 05/16/2019.  Symptomatically, he continues to have cervical neck pain unrelated to malignancy.  Exam is stable.  Plan: 1.   Labs today: CBC with diff, CMP, Mg, TSH, free T4. 2. Metastatic high-grade adenocarcinoma the RIGHT lung He is s/p 11 cycles of pembrolizumab. He is s/p 1 cycle of carboplatin, Almita, and pembrolizumab (02/08/2019). He is s/p 1 cycle of carboplatin (AUC 2), Alimta, and pembrolizumab (04/18/2019). He is s/p7cycles of Alimta and pembrolizumab (02/28/2019 - 03/29/2019; 05/09/2019- 08/08/2019).  He is tolerating treatment well without carboplatin. Labs reviewed.  Begin cycle #8 Alimta and pembrolizumb. Patient confirms that he took his Decadron yesterday. B12 every 9 weeks (due 09/19/2019). Continue folic acid. Discuss plan for restaging studies.  Discuss symptom management.  He has antiemetics and pain medications at home to use on a prn bases.   Interventions are adequate.   .    3. Nausea and vomiting post chemotherapy He appears to be doing much better without need for IVF hydration post chemotherapy.  Continue to monitor. 4. Bone metastasis Clinically, he appears to be doing well without new bone lesions. Calcium is 8.8 (corrected 9.3).  He last received Xgeva on 07/18/2019.  Xgeva today.  Continue oral calcium. 5. Cancer related pain Cancer related pain appear to be under good control.  He has cervical spine pain unrelated to metastasis.  Continue oxycodone 10-20 mg po q 4 hours prn pain.  6. Cervical spine pain             Etiology unclear.             Cervical spine MRI revealed no evidence of metastasis.             He has been diagnosed with cervicalgia by Dr Izora Ribas.  He was started on exercises. 7.   Anxiety Continue low dose Xanax.             Last prescribed 08/28/2019. 8.Pulmonary embolism Chest CT angiogram on 05/30/2019 confirmed a subsegmental pulmonary embolism. Continue Eliquis. 9.Normocytic anemia Hematocrit32.0. Hemoglobin 9.9. MCV 91.7. Ferritin was 687 with an iron saturation of 26% and a TIBC of 335 on 05/30/2019. He has chemotherapy induced anemia. Check ferritin every 3 months.  Retacrit every 2 weeks per protocol. 10.   Schedule chest, abdomen, pelvis CT on 09/16/2019. 11.  RTC in 3 weeks for MD assessment, labs (CBC with diff, CMP, TSH, free T4, ferritin, iron studies), review of imaging, and cycle #9 Alimta and pembrolizumab.   I discussed the assessment and treatment plan with the patient.  The patient was provided an opportunity to ask questions and all were answered.  The patient agreed with the plan and demonstrated an understanding of the instructions.  The patient was advised to call back if the symptoms worsen or if the condition fails  to improve as anticipated.    Lequita Asal, MD, PhD    08/30/2019, 10:33 AM  I, Selena Batten, am acting as scribe for [employer]. Mike Gip, MD, PhD.  I,  C. Mike Gip, MD, have reviewed the above documentation for accuracy and completeness, and I agree with the above.

## 2019-08-29 NOTE — Progress Notes (Unsigned)
No new changes noted today. The Name and DOB has been verified by phone.

## 2019-08-29 NOTE — Progress Notes (Signed)
The patient c/o bilateral shoulder, neck and back of his head pain ( 7). The patient name and DOB has been verified by phone today.

## 2019-08-30 ENCOUNTER — Inpatient Hospital Stay (HOSPITAL_BASED_OUTPATIENT_CLINIC_OR_DEPARTMENT_OTHER): Payer: Medicare Other | Admitting: Hematology and Oncology

## 2019-08-30 ENCOUNTER — Inpatient Hospital Stay: Payer: Medicare Other

## 2019-08-30 ENCOUNTER — Other Ambulatory Visit: Payer: Self-pay

## 2019-08-30 ENCOUNTER — Encounter: Payer: Self-pay | Admitting: Hematology and Oncology

## 2019-08-30 VITALS — BP 105/70 | HR 86 | Temp 97.5°F | Resp 18 | Wt 136.7 lb

## 2019-08-30 VITALS — BP 99/60 | HR 71 | Temp 97.7°F | Resp 17

## 2019-08-30 DIAGNOSIS — Z5111 Encounter for antineoplastic chemotherapy: Secondary | ICD-10-CM

## 2019-08-30 DIAGNOSIS — C3431 Malignant neoplasm of lower lobe, right bronchus or lung: Secondary | ICD-10-CM

## 2019-08-30 DIAGNOSIS — I2699 Other pulmonary embolism without acute cor pulmonale: Secondary | ICD-10-CM | POA: Diagnosis not present

## 2019-08-30 DIAGNOSIS — Z87891 Personal history of nicotine dependence: Secondary | ICD-10-CM | POA: Diagnosis not present

## 2019-08-30 DIAGNOSIS — Z5112 Encounter for antineoplastic immunotherapy: Secondary | ICD-10-CM

## 2019-08-30 DIAGNOSIS — T451X5A Adverse effect of antineoplastic and immunosuppressive drugs, initial encounter: Secondary | ICD-10-CM

## 2019-08-30 DIAGNOSIS — C7951 Secondary malignant neoplasm of bone: Secondary | ICD-10-CM

## 2019-08-30 DIAGNOSIS — D6481 Anemia due to antineoplastic chemotherapy: Secondary | ICD-10-CM | POA: Diagnosis not present

## 2019-08-30 DIAGNOSIS — Z923 Personal history of irradiation: Secondary | ICD-10-CM | POA: Diagnosis not present

## 2019-08-30 DIAGNOSIS — G893 Neoplasm related pain (acute) (chronic): Secondary | ICD-10-CM

## 2019-08-30 DIAGNOSIS — D649 Anemia, unspecified: Secondary | ICD-10-CM | POA: Diagnosis not present

## 2019-08-30 DIAGNOSIS — E876 Hypokalemia: Secondary | ICD-10-CM | POA: Diagnosis not present

## 2019-08-30 DIAGNOSIS — Z79899 Other long term (current) drug therapy: Secondary | ICD-10-CM | POA: Diagnosis not present

## 2019-08-30 DIAGNOSIS — E871 Hypo-osmolality and hyponatremia: Secondary | ICD-10-CM | POA: Diagnosis not present

## 2019-08-30 LAB — COMPREHENSIVE METABOLIC PANEL
ALT: 13 U/L (ref 0–44)
AST: 22 U/L (ref 15–41)
Albumin: 3.4 g/dL — ABNORMAL LOW (ref 3.5–5.0)
Alkaline Phosphatase: 66 U/L (ref 38–126)
Anion gap: 10 (ref 5–15)
BUN: 10 mg/dL (ref 8–23)
CO2: 21 mmol/L — ABNORMAL LOW (ref 22–32)
Calcium: 8.8 mg/dL — ABNORMAL LOW (ref 8.9–10.3)
Chloride: 108 mmol/L (ref 98–111)
Creatinine, Ser: 0.97 mg/dL (ref 0.61–1.24)
GFR calc Af Amer: >60 mL/min (ref >60)
GFR calc non Af Amer: >60 mL/min (ref >60)
Glucose, Bld: 86 mg/dL (ref 70–99)
Potassium: 3.5 mmol/L (ref 3.5–5.1)
Sodium: 139 mmol/L (ref 135–145)
Total Bilirubin: 0.8 mg/dL (ref 0.3–1.2)
Total Protein: 7.1 g/dL (ref 6.5–8.1)

## 2019-08-30 LAB — CBC WITH DIFFERENTIAL/PLATELET
Abs Immature Granulocytes: 0.06 10*3/uL (ref 0.00–0.07)
Basophils Absolute: 0.1 10*3/uL (ref 0.0–0.1)
Basophils Relative: 1 %
Eosinophils Absolute: 0.4 10*3/uL (ref 0.0–0.5)
Eosinophils Relative: 5 %
HCT: 32 % — ABNORMAL LOW (ref 39.0–52.0)
Hemoglobin: 9.9 g/dL — ABNORMAL LOW (ref 13.0–17.0)
Immature Granulocytes: 1 %
Lymphocytes Relative: 19 %
Lymphs Abs: 1.4 10*3/uL (ref 0.7–4.0)
MCH: 28.4 pg (ref 26.0–34.0)
MCHC: 30.9 g/dL (ref 30.0–36.0)
MCV: 91.7 fL (ref 80.0–100.0)
Monocytes Absolute: 1 10*3/uL (ref 0.1–1.0)
Monocytes Relative: 15 %
Neutro Abs: 4.2 10*3/uL (ref 1.7–7.7)
Neutrophils Relative %: 59 %
Platelets: 482 10*3/uL — ABNORMAL HIGH (ref 150–400)
RBC: 3.49 MIL/uL — ABNORMAL LOW (ref 4.22–5.81)
RDW: 16 % — ABNORMAL HIGH (ref 11.5–15.5)
WBC: 7 10*3/uL (ref 4.0–10.5)
nRBC: 0 % (ref 0.0–0.2)

## 2019-08-30 LAB — MAGNESIUM: Magnesium: 2.3 mg/dL (ref 1.7–2.4)

## 2019-08-30 LAB — T4, FREE: Free T4: 1.15 ng/dL — ABNORMAL HIGH (ref 0.61–1.12)

## 2019-08-30 LAB — TSH: TSH: 5.217 u[IU]/mL — ABNORMAL HIGH (ref 0.350–4.500)

## 2019-08-30 MED ORDER — HEPARIN SOD (PORK) LOCK FLUSH 100 UNIT/ML IV SOLN
INTRAVENOUS | Status: AC
  Filled 2019-08-30: qty 5

## 2019-08-30 MED ORDER — DENOSUMAB 120 MG/1.7ML ~~LOC~~ SOLN
120.0000 mg | Freq: Once | SUBCUTANEOUS | Status: AC
  Administered 2019-08-30: 120 mg via SUBCUTANEOUS
  Filled 2019-08-30: qty 1.7

## 2019-08-30 MED ORDER — SODIUM CHLORIDE 0.9% FLUSH
10.0000 mL | INTRAVENOUS | Status: DC | PRN
  Administered 2019-08-30: 10 mL via INTRAVENOUS
  Filled 2019-08-30: qty 10

## 2019-08-30 MED ORDER — SODIUM CHLORIDE 0.9 % IV SOLN
Freq: Once | INTRAVENOUS | Status: AC
  Filled 2019-08-30: qty 250

## 2019-08-30 MED ORDER — HEPARIN SOD (PORK) LOCK FLUSH 100 UNIT/ML IV SOLN
500.0000 [IU] | Freq: Once | INTRAVENOUS | Status: AC
  Administered 2019-08-30: 13:00:00 500 [IU] via INTRAVENOUS
  Filled 2019-08-30: qty 5

## 2019-08-30 MED ORDER — PALONOSETRON HCL INJECTION 0.25 MG/5ML
0.2500 mg | Freq: Once | INTRAVENOUS | Status: AC
  Administered 2019-08-30: 0.25 mg via INTRAVENOUS
  Filled 2019-08-30: qty 5

## 2019-08-30 MED ORDER — SODIUM CHLORIDE 0.9 % IV SOLN
500.0000 mg/m2 | Freq: Once | INTRAVENOUS | Status: AC
  Administered 2019-08-30: 900 mg via INTRAVENOUS
  Filled 2019-08-30: qty 20

## 2019-08-30 MED ORDER — SODIUM CHLORIDE 0.9 % IV SOLN
200.0000 mg | Freq: Once | INTRAVENOUS | Status: AC
  Administered 2019-08-30: 200 mg via INTRAVENOUS
  Filled 2019-08-30: qty 8

## 2019-08-31 ENCOUNTER — Telehealth: Payer: Self-pay

## 2019-08-31 NOTE — Telephone Encounter (Signed)
Telephone call to patient to schedule palliative care visit.  RN left message requesting call back to schedule palliative care visit.

## 2019-08-31 NOTE — Telephone Encounter (Signed)
Attempted to contact patient to inform him of TSH and Free T4 results. Dr. Mike Gip would like for patient to see an endocrinologist. Left VM requesting callback.

## 2019-08-31 NOTE — Telephone Encounter (Signed)
-----   Message from Lequita Asal, MD sent at 08/31/2019  8:38 AM EST ----- Regarding: Can we refer him to endocrinology?  Please call him and see if he will be willing to go.  ----- Message ----- From: Interface, Lab In [place] Sent: 08/30/2019  10:01 AM EST To: Lequita Asal, MD

## 2019-09-01 ENCOUNTER — Telehealth: Payer: Self-pay

## 2019-09-01 NOTE — Telephone Encounter (Signed)
Telephone call to patient to schedule palliative care visit.  Patient did not answer phone, RN left message requesting call back to schedule palliative care visit.

## 2019-09-02 ENCOUNTER — Telehealth: Payer: Self-pay | Admitting: *Deleted

## 2019-09-02 NOTE — Telephone Encounter (Signed)
RN attemted this telephone call to follow up courtesy call on how patient is feeling after his chemotherapy treatment on Tuesday. No answer. VM did not come on.

## 2019-09-09 ENCOUNTER — Other Ambulatory Visit: Payer: Self-pay | Admitting: *Deleted

## 2019-09-09 DIAGNOSIS — F419 Anxiety disorder, unspecified: Secondary | ICD-10-CM

## 2019-09-09 MED ORDER — ALPRAZOLAM 0.5 MG PO TABS: 0.5000 mg | ORAL_TABLET | Freq: Four times a day (QID) | ORAL | 0 refills | Status: DC | PRN

## 2019-09-10 ENCOUNTER — Other Ambulatory Visit: Payer: Self-pay | Admitting: Oncology

## 2019-09-10 MED ORDER — OXYCODONE HCL 20 MG PO TABS: ORAL_TABLET | ORAL | 0 refills | Status: DC

## 2019-09-12 ENCOUNTER — Telehealth: Payer: Self-pay | Admitting: *Deleted

## 2019-09-12 ENCOUNTER — Ambulatory Visit
Admission: EM | Admit: 2019-09-12 | Discharge: 2019-09-12 | Disposition: A | Discharge status: Home / Self Care | Payer: Medicare Other | Attending: Family Medicine | Admitting: Family Medicine

## 2019-09-12 ENCOUNTER — Other Ambulatory Visit: Payer: Self-pay

## 2019-09-12 ENCOUNTER — Other Ambulatory Visit: Payer: Self-pay | Admitting: Hematology and Oncology

## 2019-09-12 DIAGNOSIS — G893 Neoplasm related pain (acute) (chronic): Secondary | ICD-10-CM

## 2019-09-12 DIAGNOSIS — K5903 Drug induced constipation: Secondary | ICD-10-CM

## 2019-09-12 MED ORDER — OXYCODONE HCL 20 MG PO TABS: ORAL_TABLET | ORAL | 0 refills | Status: DC

## 2019-09-12 NOTE — Telephone Encounter (Signed)
I spoke with patient's friend. They are currently at urgent care waiting to be evaluated. Patient reports feeling significant anxiety and thinks he might be having a panic attack after running out of the alprazolam. I did call his pharmacy [facility]) and patient was unable to have the prescription that was sent last week filled as it was too early. The pharmacy will go ahead and fill this today. I did increase the frequency of the alprazolam to QID. Could also consider increasing dose of fluoxetine to 60mg  daily if needed. Alternatively, could consider augmenting with gabapentin.

## 2019-09-12 NOTE — Telephone Encounter (Signed)
Tine called requesting a return call from Sharion Dove, NP to Mr Barco because he thinks his medications are wrong. Please return call [phone]

## 2019-09-12 NOTE — Telephone Encounter (Signed)
Received phone call from patient and his friend, [name], stating that pt feels like he is having withdrawal symptoms from xanax. Pt unable to pick up prescription sent in on Friday 2/19 at pharmacy due to it being too early to refill. Pt states that he has not had xanax since last week and feels like he is having a panic attack. Informed pt that will discuss with Josh regarding how to further manage his increased anxiety and medication management. Pt stated that he needs help now. Advised pt to go to urgent care due to increased anxiety at this time and will follow up with him later today after discussing with Josh. Pt and Kristine verbalized understanding.

## 2019-09-12 NOTE — ED Notes (Signed)
Pt states he does not wish to wait any more and states that he will go get his medication from the pharmacy.

## 2019-09-12 NOTE — Telephone Encounter (Signed)
  Is he discussing with Merrily Pew today?  Refill for oxycodone sent in.  M

## 2019-09-12 NOTE — ED Triage Notes (Signed)
Pt presents with complaints of constipation for "days" and anxiety. Reports being out of his xanax for a week. Patient denies any pain at this time. Pt is anxious during triage.

## 2019-09-12 NOTE — Telephone Encounter (Signed)
Rx refill sent in over the weekend by on call provider

## 2019-09-13 NOTE — Telephone Encounter (Signed)
Jimmy West spoke with him by phone yesterday and spoke with pharmacy to fill xanax prescription.

## 2019-09-14 ENCOUNTER — Telehealth: Payer: Self-pay

## 2019-09-14 NOTE — Telephone Encounter (Signed)
Telephone call to patient to schedule palliative care visit.  Patient didn't answer phone.  RN left message requesting call back to schedule palliative care visit.

## 2019-09-16 ENCOUNTER — Ambulatory Visit
Admission: RE | Admit: 2019-09-16 | Discharge: 2019-09-16 | Disposition: A | Discharge status: Home / Self Care | Payer: Medicare Other | Source: Ambulatory Visit | Attending: Hematology and Oncology | Admitting: Hematology and Oncology

## 2019-09-16 ENCOUNTER — Other Ambulatory Visit: Payer: Medicare Other

## 2019-09-16 ENCOUNTER — Other Ambulatory Visit: Payer: Self-pay

## 2019-09-16 DIAGNOSIS — D3502 Benign neoplasm of left adrenal gland: Secondary | ICD-10-CM | POA: Diagnosis not present

## 2019-09-16 DIAGNOSIS — C7951 Secondary malignant neoplasm of bone: Secondary | ICD-10-CM | POA: Insufficient documentation

## 2019-09-16 DIAGNOSIS — C3431 Malignant neoplasm of lower lobe, right bronchus or lung: Secondary | ICD-10-CM

## 2019-09-16 DIAGNOSIS — R911 Solitary pulmonary nodule: Secondary | ICD-10-CM | POA: Diagnosis not present

## 2019-09-16 DIAGNOSIS — Z85118 Personal history of other malignant neoplasm of bronchus and lung: Secondary | ICD-10-CM | POA: Diagnosis not present

## 2019-09-16 MED ORDER — IOHEXOL 300 MG/ML  SOLN
100.0000 mL | Freq: Once | INTRAMUSCULAR | Status: AC | PRN
  Administered 2019-09-16: 100 mL via INTRAVENOUS

## 2019-09-18 NOTE — Progress Notes (Signed)
[facility]  [address] [place], [place] [address] Phone: [phone]  Fax: [phone]   Clinic Day:  09/20/2019  Referring physician: [name], NP  Chief Complaint: Jimmy West is a 69 y.o. male with metastatic adenocarcinoma of the lung who is seen for review of interval restaging studies and assessment prior to cycle #9 Alimta and pembrolizumab.    HPI: The patient was last seen in the medical oncology clinic on 08/29/2018. At that time, he had cervical neck pain unrelated to malignancy.  Hematocrit 32.0, hemoglobin 9.9, platelets 482,000, WBC 7,000. Calcium was 8.8 (corrected 9.3).  TSH was 5.217 (elevated) and free T4 1.15 (elevated).  Magnesium was 2.3.  He received cycle #8 Alimta and pembrolizumab.  He received Xgeva.  Chest, abdomen and pelvis CT on 09/16/2019 revealed a similar-appearing spiculated right lower lobe nodule. There was no evidence for metastatic disease in the abdomen or pelvis. There was stable left adrenal adenoma. There was emphysema and aortic atherosclerosis.  During the interim, he has felt "ok".  He is eating well, but continues to have a small appetite. He is snacking throughout the day. He has no nasuea or vomiting. He has not needed any fluids after chemotherapy. He reports no cough or shortness of breath. His neck pain is (3-4/10) today.  His back pain comes and goes. His arm and shoulder pain comes and goes. He is doing the exercises recommended by Dr. Izora Ribas.   He is unsure if he will return back to work. He plans on getting the COVID-19 vaccine. He is not active. I encouraged him to be more active and do things he enjoys. He lays in bed majority of the days. He is receiving Meals on Wheels where he receives frozen meals for 5 days a week.   He remains on Eliquis.    Past Medical History:  Diagnosis Date  . Bulging lumbar disc   . Cancer ([place])    stage 4 lung cancer  . Heart attack [facility])      Past Surgical History:  Procedure Laterality Date  . CARDIAC CATHETERIZATION     two stents  . KNEE SURGERY Left   . PORTA CATH INSERTION N/A 05/21/2018   Procedure: PORTA CATH INSERTION;  Surgeon: Algernon Huxley, MD;  Location: [facility];  Service: Cardiovascular;  Laterality: N/A;    Family History  Problem Relation Age of Onset  . Heart failure Father   . Cancer Maternal Aunt   . Heart failure Maternal Uncle   . Dementia Paternal Grandmother   . Cancer Maternal Aunt     Social History:  reports that he quit smoking about 15 years ago. His smoking use included cigarettes. He has a 22.50 pack-year smoking history. He has never used smokeless tobacco. He reports current alcohol use of about 15.0 standard drinks of alcohol per week. He reports that he does not use drugs. He started smoking at age 1. He is smoking 1 1/2 packs/day. Patient denies known exposures to radiation ortoxins. 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 He has not been working recently as the salon was closed. He plays golf occasionally.He has 8 cats. The patient is alone today.  Allergies: No Known Allergies  Current Medications: Current Outpatient Medications  Medication Sig Dispense Refill  . acetaminophen (TYLENOL) 500 MG tablet Take 500 mg by mouth 3 (three) times daily as needed.    Marland Kitchen albuterol (VENTOLIN HFA) 108 (90  Base) MCG/ACT inhaler Inhale 2 puffs into the lungs every 6 (six) hours as needed for wheezing or shortness of breath. 18 g 3  . ALPRAZolam (XANAX) 0.5 MG tablet Take 1 tablet (0.5 mg total) by mouth 4 (four) times daily as needed for anxiety. 60 tablet 0  . apixaban (ELIQUIS) 5 MG TABS tablet Take 1 tablet (21m) twice daily 60 tablet 0  . Calcium 600-400 MG-UNIT CHEW Chew 2 tablets by mouth daily.    .Marland Kitchendexamethasone (DECADRON) 4 MG tablet Take 1 tab two times a day the day before Alimta chemo, then take 2 tabs once a day for 3 days starting the day after  chemo. 30 tablet 1  . FLUoxetine (PROZAC) 40 MG capsule Take 1 capsule (40 mg total) by mouth daily. 30 capsule 3  . folic acid (FOLVITE) 1 MG tablet Take 1 tablet (1 mg total) by mouth daily. Start 5-7 days before Alimta chemotherapy. Continue until 21 days after Alimta completed. 100 tablet 3  . Oxycodone HCl 20 MG TABS TAKE 1/2 TO 1 TABLET BY MOUTH EVERY 4 HOURS AS NEEDED FOR SEVERE PAIN 60 tablet 0  . polyethylene glycol (MIRALAX / GLYCOLAX) packet Take 17 g by mouth daily.    .Marland Kitchenaspirin EC 81 MG tablet Take 81 mg by mouth daily.    .Marland Kitchenibuprofen (ADVIL,MOTRIN) 200 MG tablet Take 200 mg by mouth every 6 (six) hours as needed.    . ondansetron (ZOFRAN ODT) 4 MG disintegrating tablet Take 1 tablet (4 mg total) by mouth every 6 (six) hours as needed for nausea or vomiting. (Patient not taking: Reported on 08/08/2019) 30 tablet 1  . potassium chloride SA (KLOR-CON) 20 MEQ tablet Take 1 tablet (20 mEq total) by mouth 2 (two) times daily. (Patient not taking: Reported on 08/08/2019) 6 tablet 0  . senna (SENOKOT) 8.6 MG tablet Take 1 tablet by mouth as needed.      No current facility-administered medications for this visit.   Facility-Administered Medications Ordered in Other Visits  Medication Dose Route Frequency Provider Last Rate Last Admin  . 0.9 %  sodium chloride infusion   Intravenous Once Dorell Gatlin C, MD      . 0.9 %  sodium chloride infusion   Intravenous Once PRN Iness Pangilinan C, MD      . albuterol (PROVENTIL) (2.5 MG/3ML) 0.083% nebulizer solution 2.5 mg  2.5 mg Nebulization Once PRN Rande Dario C, MD      . alteplase (CATHFLO ACTIVASE) injection 2 mg  2 mg Intracatheter Once PRN CLequita Asal MD      . EPINEPHrine (ADRENALIN) 1 MG/10ML injection 0.25 mg  0.25 mg Intravenous Once PRN Drewey Begue C, MD      . EPINEPHrine (ADRENALIN) 1 MG/10ML injection 0.25 mg  0.25 mg Intravenous Once PRN Anastasia Tompson C, MD      . heparin lock flush 100 unit/mL  500  Units Intracatheter Once PRN CNolon StallsC, MD      . heparin lock flush 100 unit/mL  250 Units Intracatheter Once PRN CNolon StallsC, MD      . heparin lock flush 100 unit/mL  500 Units Intravenous Once Kaylia Winborne C, MD      . sodium chloride flush (NS) 0.9 % injection 10 mL  10 mL Intravenous PRN CNolon StallsC, MD   10 mL at 03/04/19 0959  . sodium chloride flush (NS) 0.9 % injection 10 mL  10 mL Intracatheter Once PRN CNolon Stalls  C, MD      . sodium chloride flush (NS) 0.9 % injection 10 mL  10 mL Intravenous PRN Lequita Asal, MD   10 mL at 09/20/19 0914  . sodium chloride flush (NS) 0.9 % injection 3 mL  3 mL Intracatheter Once PRN Lequita Asal, MD        Review of Systems  Constitutional: Negative for chills, diaphoresis, fever, malaise/fatigue and weight loss (up 2 lbs).       Feeling "ok".  Not active.  HENT: Negative for congestion, ear discharge, ear pain, hearing loss, nosebleeds, sinus pain, sore throat and tinnitus.   Eyes: Negative.  Negative for blurred vision, double vision and photophobia.  Respiratory: Negative for cough (chronic, better), hemoptysis, sputum production and shortness of breath.   Cardiovascular: Negative.  Negative for chest pain, palpitations and leg swelling.  Gastrointestinal: Negative.  Negative for abdominal pain, blood in stool, constipation, diarrhea, heartburn, melena, nausea and vomiting.       Eating well. Small appetite. Snacking throughout the day.  Genitourinary: Negative.  Negative for dysuria, hematuria and urgency.  Musculoskeletal: Positive for back pain (comes and goes) and neck pain (3-4/10). Negative for joint pain (shoulders) and myalgias (arms).  Skin: Negative for itching and rash.  Neurological: Negative.  Negative for dizziness, tingling, sensory change, weakness and headaches.  Endo/Heme/Allergies: Negative.  Does not bruise/bleed easily.  Psychiatric/Behavioral: Negative.  Negative for  depression and memory loss. The patient is not nervous/anxious (chronic of Xanax) and does not have insomnia.   All other systems reviewed and are negative.  Performance status (ECOG):  1-2  Vitals Blood pressure 112/71, pulse 68, temperature (!) 96.9 F (36.1 C), temperature source Tympanic, resp. rate 18, height 5' 6"  (1.676 m), weight 138 lb 0.1 oz (62.6 kg), SpO2 96 %.   Physical Exam  Constitutional: He is oriented to person, place, and time. He appears well-developed and well-nourished. No distress.  HENT:  Head: Normocephalic and atraumatic.  Mouth/Throat: Oropharynx is clear and moist. No oropharyngeal exudate.  Thin brown graying hair.  Mustache.  Dentures.  Mask.  Eyes: Pupils are equal, round, and reactive to light. Conjunctivae and EOM are normal. No scleral icterus.  Glasses. Gray blue eyes.  Neck: No JVD present.  Cardiovascular: Normal rate and regular rhythm.  No murmur heard. Pulmonary/Chest: Effort normal and breath sounds normal. No respiratory distress. He has no wheezes. He has no rales. He exhibits no tenderness.  Abdominal: Soft. Bowel sounds are normal. He exhibits no distension and no mass. There is no abdominal tenderness. There is no rebound and no guarding.  Musculoskeletal:        General: Tenderness (lower cervical spine) present. No edema. Normal range of motion.     Cervical back: Normal range of motion and neck supple.  Lymphadenopathy:       Head (right side): No preauricular, no posterior auricular and no occipital adenopathy present.       Head (left side): No preauricular, no posterior auricular and no occipital adenopathy present.    He has no cervical adenopathy.    He has no axillary adenopathy.       Right: No inguinal and no supraclavicular adenopathy present.       Left: No inguinal and no supraclavicular adenopathy present.  Neurological: He is alert and oriented to person, place, and time.  Skin: Skin is warm and dry. He is not  diaphoretic.  Psychiatric: He has a normal mood and affect. His behavior  is normal. Judgment and thought content normal.  Nursing note and vitals reviewed.   Infusion on 09/20/2019  Component Date Value Ref Range Status  . Sodium 09/20/2019 136  135 - 145 mmol/L Final  . Potassium 09/20/2019 4.1  3.5 - 5.1 mmol/L Final  . Chloride 09/20/2019 105  98 - 111 mmol/L Final  . CO2 09/20/2019 22  22 - 32 mmol/L Final  . Glucose, Bld 09/20/2019 124* 70 - 99 mg/dL Final   Glucose reference range applies only to samples taken after fasting for at least 8 hours.  . BUN 09/20/2019 10  8 - 23 mg/dL Final  . Creatinine, Ser 09/20/2019 1.11  0.61 - 1.24 mg/dL Final  . Calcium 09/20/2019 8.8* 8.9 - 10.3 mg/dL Final  . Total Protein 09/20/2019 7.0  6.5 - 8.1 g/dL Final  . Albumin 09/20/2019 3.6  3.5 - 5.0 g/dL Final  . AST 09/20/2019 20  15 - 41 U/L Final  . ALT 09/20/2019 14  0 - 44 U/L Final  . Alkaline Phosphatase 09/20/2019 76  38 - 126 U/L Final  . Total Bilirubin 09/20/2019 0.6  0.3 - 1.2 mg/dL Final  . GFR calc non Af Amer 09/20/2019 >60  >60 mL/min Final  . GFR calc Af Amer 09/20/2019 >60  >60 mL/min Final  . Anion gap 09/20/2019 9  5 - 15 Final   Performed at [facility], [address] [place], [place] [address]  . WBC 09/20/2019 7.9  4.0 - 10.5 K/uL Final  . RBC 09/20/2019 3.33* 4.22 - 5.81 MIL/uL Final  . Hemoglobin 09/20/2019 9.7* 13.0 - 17.0 g/dL Final  . HCT 09/20/2019 31.5* 39.0 - 52.0 % Final  . MCV 09/20/2019 94.6  80.0 - 100.0 fL Final  . MCH 09/20/2019 29.1  26.0 - 34.0 pg Final  . MCHC 09/20/2019 30.8  30.0 - 36.0 g/dL Final  . RDW 09/20/2019 17.3* 11.5 - 15.5 % Final  . Platelets 09/20/2019 380  150 - 400 K/uL Final  . nRBC 09/20/2019 0.0  0.0 - 0.2 % Final  . Neutrophils Relative % 09/20/2019 84  % Final  . Neutro Abs 09/20/2019 6.6  1.7 - 7.7 K/uL Final  . Lymphocytes Relative 09/20/2019 6  % Final  . Lymphs Abs 09/20/2019 0.5* 0.7 - 4.0 K/uL Final  .  Monocytes Relative 09/20/2019 5  % Final  . Monocytes Absolute 09/20/2019 0.4  0.1 - 1.0 K/uL Final  . Eosinophils Relative 09/20/2019 3  % Final  . Eosinophils Absolute 09/20/2019 0.3  0.0 - 0.5 K/uL Final  . Basophils Relative 09/20/2019 1  % Final  . Basophils Absolute 09/20/2019 0.0  0.0 - 0.1 K/uL Final  . Immature Granulocytes 09/20/2019 1  % Final  . Abs Immature Granulocytes 09/20/2019 0.06  0.00 - 0.07 K/uL Final   Performed at [facility], [address] [place], [place] [address]  . Magnesium 09/20/2019 2.2  1.7 - 2.4 mg/dL Final   Performed at [facility], [address], [place], [place] [address]    Assessment:  Nafis Farnan is a 69 y.o. male with metastatichigh-grade adenocarcinomaof the right lungs/p CT-guided biopsy of a RLL lung mass on 05/11/2018. Pathologyrevealed an invasive high-grade adenocarcinoma, with predominantly solid growth pattern. The neoplastic cells were TTF-1 (+), Napsin A (+), and P40 (+). He has a T4 vertebral metastasis. Clinical stage is T4N1M1.  There was not enough material for Foundation One testing. PDL-1revealed TPS 90%.  PET  scanon 04/30/2018 revealed a 3.4 cm hypermetabolic RLL pulmonary mass (SUV 13.2), 11 mm pulmonary nodule in the LEFT lung (SUV 4.5), RIGHT paratracheal lymph node (SUV 5.1), and hypermetabolic activity within the T4 vertebral body (SUV 10.2).   Thoracic spine MRIon 05/03/2018 revealed T4 metastasiswith a 40% pathologic compression deformity and 5 mm of retropulsion of the vertebral body. Retropulsion results in mild spinal canal stenosis and mild bilateral C4-5 foraminal stenosis. There was paravertebral soft tissue thickening from mid T3 to mid T5, likely representing edema related to the pathologic compression deformity vs. possible extraosseous extension of the neoplasm. There were no additional thoracic spinal metastases noted.   Head MRIon 05/03/2018 revealed no  intracranial metastatic disease. 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 Incidental mention made of mild paranasal sinus disease.  Hereceived 11cycles ofpembrolizumab(05/24/2018 - 02/07/2019). He toleratedtreatment well. CEAwas 1.3 on 08/30/2018.LDHwas 165 on 11/29/2018.  He completed T4 radiationon 07/07/2018. He receives Xgevamonthly (06/03/2018 -07/18/2019).  He received 1 cycle of carboplatin, Alimta, and pembrolizumabon 02/08/2019. Cycle #1 was complicated by nausea, vomiting, and diarrhea necessitating hospitalization.Decision made to hold carboplatin with his second dose.He iss/p2cycles ofAlimta and pembrolizumab (02/28/2019 - 03/29/2019).Cycle #2 was complicated by nausea, vomiting, and dehydration requring fluids in clinic and an overnight stay in the hospital. Hereceivedcarboplatin (AUC 2), Alimta, and pebrolizumab(04/18/2019).He tolerated it poorly.   He has receivedcycle #8Alimta and pembrolizumabon 05/09/2019(last 08/30/2019). He tolerated well. He receives B12every 9 weeks (last 09/19/2019).  He hascancer-related painin T4. He is currently taking Fentanyl75 mcg/hr and oxycodone10 mg every 4 hours prn.  Cervical and thoracic spine [facility]10/2019 revealed unchanged severe compression deformity/vertebral plana of T4 with a stable degree of retropulsion and focal mild spinal canal stenosis. There was interval enlargement of a right lower lobe pulmonary mass(4.5 x 3.5 cm compared to 3.9 x 2.9 cm)with redemonstrated spiculated pulmonary nodules.  Chest, abdomen, pelvisCTon 01/28/2019 revealed slight interval enlargement of a right lower lobe mass with central necrosis measuring 4.6 x 3.4 cm, previously 4.0 x 3.0 cm. There was no change in right upper  lobe nodules(1.4 and 0.8 cm).Therewas almost no residua of left lung nodules, with irregular opacities in the apical left upper lobeandsuperior segment left lower lobe. There was no change in right hilar soft tissue and lymph nodes.There wasvertebra plana deformity of T4andno evidence of new osseous metastatic disease. No evidence of distant metastatic disease in the abdomen or pelvis.The1.6 x 1.1 cmleft adrenal nodule(non-metabolic on prior PET scan),was unchanged.  Abdomen and pelvis CTon 04/23/2019 revealed no acute abdominal or pelvic pathology. There was fluid in the colon which can be seen with diarrhea. There was diverticulosis without evidence of diverticulitis. There was a stable 1.5 cm left adrenal nodule.  Chest, abdomen, and pelvisCTon 10/16/2020revealed a positive response to interval therapy for the known right lower lobe lung carcinoma(4.6 x 3.4 cm to3.1 x 2.8 cm transversely). There hadbeen a mild interval decrease contiguous soft tissue that extends along the right hilum. The two right upper lobe noduleswerestable from the most recent prior study (smaller than 04/27/2018).There are no new lung nodules.Therewasno evidence of new metastatic disease.There was stable severe compression deformity/vertebra plana of T4.Therewasno evidence of other osseous metastatic disease.There was a stable left adrenal nodule consistent with an adenoma.  ChestCT angiogramon 05/30/2019 revealed a tiny subsegmental pulmonary embolusin the right lower lobe. There areas of ground-glass  at the periphery of the right chest hadbecome more conspicuous. This mayrelate to mild pneumonitis, perhaps due to radiation, attention on follow-up.The dominant nodule in the right lung basewasstable in size. Heis onEliquis.  Chest, abdomen and pelvis CT on 09/16/2019 revealed a similar-appearing spiculated right lower lobe nodule. There was no evidence for metastatic disease in  the abdomen or pelvis. There was stable left adrenal adenoma.  He has cervicalgia.  Cervical spine MRI on 08/14/2019 showed no evidence of metastatic disease within the cervical spine.  He is followed by Dr Izora Ribas.  Hehad transient renal insufficiencyon 03/23/2019. Creatinine was 1.32 (baseline 0.61-1.0). Renal ultrasoundon 03/24/2019 revealed no acute abnormality identified.There was no hydronephrosis or bladder distention.Creatinine is 0.82 today.  Stool waspositive for C difficile + diarrheaon 06/14/2019.Hebeganoral vancomycinbeginning11/27/2020.  He received the influenza vaccineon 05/16/2019.  Symptomatically, his neckfeels somewhat better.  His midback pain comes and goes.  Activity level is minimal.  Exam reveals slight tenderness at the base of his neck.  Plan: 1.    Labs today: CBC with diff, CMP, ferritin, iron studies, TSH, free T4. 2. Metastatic high-grade adenocarcinoma the RIGHT lung He is s/p 11 cycles of pembrolizumab. He is s/p 1 cycle of carboplatin, Almita, and pembrolizumab (02/08/2019). He is s/p 1 cycle of carboplatin (AUC 2), Alimta, and pembrolizumab (04/18/2019). He is s/p8cycles of Alimta and pembrolizumab (02/28/2019 - 03/29/2019; 05/09/2019- 08/30/2019). Hecontinues treatment without carboplatin. Restaging CT scans on 09/16/2019 personally reviewed.  Agree with radiology interpretation.  He has stable disease.  Discuss plan for continuation of therapy.  Copy of scan provided patient. Labs reviewed.Begin cycle #9 Alimta and pembrolizumab. Patient took Decadron premed yesterday. Continue B12 every 9 weeks (last03/07/2019; next due 11/22/2019). Continue folic acid. Discuss symptom management.  He has antiemetics and pain medications at home to use on a prn bases.  Interventions are adequate.    3. Bone metastasis Discuss interval follow-up with Dr  Izora Ribas. Last Xgeva on02/03/2020. Next Xgeva due 09/27/2019. 4. Cancer related pain Painis better controlled.  He has intermittent mid back pain Continue oxycodone 10-20 mg p.o. every 4 hours as needed pain. 5. Cervical spine pain Etiology unclear. Cervical spine MRI on 08/11/2018 revealed no metastasis. Exercises are helping.  Follow-up as needed with Dr Izora Ribas 6.Anxiety Continue low dose Xanax. 7.Pulmonary embolism  Patient on long term anticoagulation.  Continue Eliquis. 8.Normocytic anemia Hematocrit31.5. Hemoglobin9.7. MCV94.6today. Ferritin 773 with an iron saturation of 14% and a TIBC of 349. He has chemotherapy induced anemia. Anticipate Retacritevery 2 weeksper protocol. 9.   RTC in 1 week for labs (CBC, BMP) and Xgeva. 10.   RTC in 3 weeks for MD assessment, labs (CBC with diff, CMP, Mg), and cycle #10 Alimta and pembrolizumab.  I discussed the assessment and treatment plan with the patient.  The patient was provided an opportunity to ask questions and all were answered.  The patient agreed with the plan and demonstrated an understanding of the instructions.  The patient was advised to call back if the symptoms worsen or if the condition fails to improve as anticipated.   Lequita Asal, MD, PhD    09/20/2019, 10:23 AM  I, Selena Batten, am acting as scribe for [employer]. Mike Gip, MD, PhD.  I, Johniya Durfee C. Mike Gip, MD, have reviewed the above documentation for accuracy and completeness, and I agree with the above.

## 2019-09-19 ENCOUNTER — Inpatient Hospital Stay: Payer: Medicare Other | Attending: Hematology and Oncology

## 2019-09-19 ENCOUNTER — Other Ambulatory Visit: Payer: Self-pay

## 2019-09-19 VITALS — BP 134/70 | HR 84 | Temp 98.0°F | Resp 17

## 2019-09-19 DIAGNOSIS — G893 Neoplasm related pain (acute) (chronic): Secondary | ICD-10-CM | POA: Diagnosis not present

## 2019-09-19 DIAGNOSIS — Z5111 Encounter for antineoplastic chemotherapy: Secondary | ICD-10-CM | POA: Diagnosis not present

## 2019-09-19 DIAGNOSIS — D6481 Anemia due to antineoplastic chemotherapy: Secondary | ICD-10-CM | POA: Insufficient documentation

## 2019-09-19 DIAGNOSIS — C3431 Malignant neoplasm of lower lobe, right bronchus or lung: Secondary | ICD-10-CM | POA: Diagnosis not present

## 2019-09-19 DIAGNOSIS — C7951 Secondary malignant neoplasm of bone: Secondary | ICD-10-CM | POA: Diagnosis not present

## 2019-09-19 DIAGNOSIS — T451X5A Adverse effect of antineoplastic and immunosuppressive drugs, initial encounter: Secondary | ICD-10-CM | POA: Diagnosis not present

## 2019-09-19 DIAGNOSIS — Z79899 Other long term (current) drug therapy: Secondary | ICD-10-CM | POA: Diagnosis not present

## 2019-09-19 DIAGNOSIS — F419 Anxiety disorder, unspecified: Secondary | ICD-10-CM | POA: Insufficient documentation

## 2019-09-19 MED ORDER — CYANOCOBALAMIN 1000 MCG/ML IJ SOLN
1000.0000 ug | Freq: Once | INTRAMUSCULAR | Status: AC
  Administered 2019-09-19: 1000 ug via INTRAMUSCULAR

## 2019-09-19 NOTE — Patient Instructions (Signed)

## 2019-09-20 ENCOUNTER — Inpatient Hospital Stay: Payer: Medicare Other

## 2019-09-20 ENCOUNTER — Inpatient Hospital Stay (HOSPITAL_BASED_OUTPATIENT_CLINIC_OR_DEPARTMENT_OTHER): Payer: Medicare Other | Admitting: Hematology and Oncology

## 2019-09-20 ENCOUNTER — Encounter: Payer: Self-pay | Admitting: Hematology and Oncology

## 2019-09-20 VITALS — BP 125/74 | HR 69 | Temp 96.9°F | Resp 18

## 2019-09-20 VITALS — BP 112/71 | HR 68 | Temp 96.9°F | Resp 18 | Ht 66.0 in | Wt 138.0 lb

## 2019-09-20 DIAGNOSIS — Z5112 Encounter for antineoplastic immunotherapy: Secondary | ICD-10-CM | POA: Diagnosis not present

## 2019-09-20 DIAGNOSIS — C7951 Secondary malignant neoplasm of bone: Secondary | ICD-10-CM

## 2019-09-20 DIAGNOSIS — C3431 Malignant neoplasm of lower lobe, right bronchus or lung: Secondary | ICD-10-CM

## 2019-09-20 DIAGNOSIS — F419 Anxiety disorder, unspecified: Secondary | ICD-10-CM

## 2019-09-20 DIAGNOSIS — D6481 Anemia due to antineoplastic chemotherapy: Secondary | ICD-10-CM | POA: Diagnosis not present

## 2019-09-20 DIAGNOSIS — I2782 Chronic pulmonary embolism: Secondary | ICD-10-CM

## 2019-09-20 DIAGNOSIS — Z5111 Encounter for antineoplastic chemotherapy: Secondary | ICD-10-CM | POA: Diagnosis not present

## 2019-09-20 DIAGNOSIS — G893 Neoplasm related pain (acute) (chronic): Secondary | ICD-10-CM

## 2019-09-20 DIAGNOSIS — T451X5A Adverse effect of antineoplastic and immunosuppressive drugs, initial encounter: Secondary | ICD-10-CM | POA: Diagnosis not present

## 2019-09-20 LAB — COMPREHENSIVE METABOLIC PANEL
ALT: 14 U/L (ref 0–44)
AST: 20 U/L (ref 15–41)
Albumin: 3.6 g/dL (ref 3.5–5.0)
Alkaline Phosphatase: 76 U/L (ref 38–126)
Anion gap: 9 (ref 5–15)
BUN: 10 mg/dL (ref 8–23)
CO2: 22 mmol/L (ref 22–32)
Calcium: 8.8 mg/dL — ABNORMAL LOW (ref 8.9–10.3)
Chloride: 105 mmol/L (ref 98–111)
Creatinine, Ser: 1.11 mg/dL (ref 0.61–1.24)
GFR calc Af Amer: >60 mL/min (ref >60)
GFR calc non Af Amer: >60 mL/min (ref >60)
Glucose, Bld: 124 mg/dL — ABNORMAL HIGH (ref 70–99)
Potassium: 4.1 mmol/L (ref 3.5–5.1)
Sodium: 136 mmol/L (ref 135–145)
Total Bilirubin: 0.6 mg/dL (ref 0.3–1.2)
Total Protein: 7 g/dL (ref 6.5–8.1)

## 2019-09-20 LAB — CBC WITH DIFFERENTIAL/PLATELET
Abs Immature Granulocytes: 0.06 10*3/uL (ref 0.00–0.07)
Basophils Absolute: 0 10*3/uL (ref 0.0–0.1)
Basophils Relative: 1 %
Eosinophils Absolute: 0.3 10*3/uL (ref 0.0–0.5)
Eosinophils Relative: 3 %
HCT: 31.5 % — ABNORMAL LOW (ref 39.0–52.0)
Hemoglobin: 9.7 g/dL — ABNORMAL LOW (ref 13.0–17.0)
Immature Granulocytes: 1 %
Lymphocytes Relative: 6 %
Lymphs Abs: 0.5 10*3/uL — ABNORMAL LOW (ref 0.7–4.0)
MCH: 29.1 pg (ref 26.0–34.0)
MCHC: 30.8 g/dL (ref 30.0–36.0)
MCV: 94.6 fL (ref 80.0–100.0)
Monocytes Absolute: 0.4 10*3/uL (ref 0.1–1.0)
Monocytes Relative: 5 %
Neutro Abs: 6.6 10*3/uL (ref 1.7–7.7)
Neutrophils Relative %: 84 %
Platelets: 380 10*3/uL (ref 150–400)
RBC: 3.33 MIL/uL — ABNORMAL LOW (ref 4.22–5.81)
RDW: 17.3 % — ABNORMAL HIGH (ref 11.5–15.5)
WBC: 7.9 10*3/uL (ref 4.0–10.5)
nRBC: 0 % (ref 0.0–0.2)

## 2019-09-20 LAB — IRON AND TIBC
Iron: 48 ug/dL (ref 45–182)
Saturation Ratios: 14 % — ABNORMAL LOW (ref 17.9–39.5)
TIBC: 349 ug/dL (ref 250–450)
UIBC: 301 ug/dL

## 2019-09-20 LAB — T4, FREE: Free T4: 0.85 ng/dL (ref 0.61–1.12)

## 2019-09-20 LAB — FERRITIN: Ferritin: 773 ng/mL — ABNORMAL HIGH (ref 24–336)

## 2019-09-20 LAB — TSH: TSH: 3.291 u[IU]/mL (ref 0.350–4.500)

## 2019-09-20 LAB — MAGNESIUM: Magnesium: 2.2 mg/dL (ref 1.7–2.4)

## 2019-09-20 MED ORDER — SODIUM CHLORIDE 0.9 % IV SOLN
200.0000 mg | Freq: Once | INTRAVENOUS | Status: AC
  Administered 2019-09-20: 200 mg via INTRAVENOUS
  Filled 2019-09-20: qty 8

## 2019-09-20 MED ORDER — SODIUM CHLORIDE 0.9 % IV SOLN
500.0000 mg/m2 | Freq: Once | INTRAVENOUS | Status: AC
  Administered 2019-09-20: 900 mg via INTRAVENOUS
  Filled 2019-09-20: qty 20

## 2019-09-20 MED ORDER — HEPARIN SOD (PORK) LOCK FLUSH 100 UNIT/ML IV SOLN
500.0000 [IU] | Freq: Once | INTRAVENOUS | Status: AC
  Administered 2019-09-20: 500 [IU] via INTRAVENOUS
  Filled 2019-09-20: qty 5

## 2019-09-20 MED ORDER — SODIUM CHLORIDE 0.9% FLUSH
10.0000 mL | INTRAVENOUS | Status: DC | PRN
  Administered 2019-09-20: 10 mL via INTRAVENOUS
  Filled 2019-09-20: qty 10

## 2019-09-20 MED ORDER — PALONOSETRON HCL INJECTION 0.25 MG/5ML
0.2500 mg | Freq: Once | INTRAVENOUS | Status: AC
  Administered 2019-09-20: 0.25 mg via INTRAVENOUS

## 2019-09-20 MED ORDER — SODIUM CHLORIDE 0.9 % IV SOLN
Freq: Once | INTRAVENOUS | Status: AC
  Filled 2019-09-20: qty 250

## 2019-09-20 NOTE — Progress Notes (Signed)
The patient c/o left side neck pain 3-4 pain level reported today.

## 2019-09-21 ENCOUNTER — Telehealth: Payer: Self-pay

## 2019-09-21 NOTE — Telephone Encounter (Signed)
Telephone call to patient to schedule palliative are visit.  Patient did not answer phone.  RN left message requesting call back to schedule palliative care visit.

## 2019-09-27 ENCOUNTER — Other Ambulatory Visit: Payer: Self-pay

## 2019-09-27 ENCOUNTER — Inpatient Hospital Stay: Payer: Medicare Other

## 2019-09-27 DIAGNOSIS — G893 Neoplasm related pain (acute) (chronic): Secondary | ICD-10-CM | POA: Diagnosis not present

## 2019-09-27 DIAGNOSIS — F419 Anxiety disorder, unspecified: Secondary | ICD-10-CM | POA: Diagnosis not present

## 2019-09-27 DIAGNOSIS — C7951 Secondary malignant neoplasm of bone: Secondary | ICD-10-CM | POA: Diagnosis not present

## 2019-09-27 DIAGNOSIS — C3431 Malignant neoplasm of lower lobe, right bronchus or lung: Secondary | ICD-10-CM

## 2019-09-27 DIAGNOSIS — Z5111 Encounter for antineoplastic chemotherapy: Secondary | ICD-10-CM | POA: Diagnosis not present

## 2019-09-27 DIAGNOSIS — D6481 Anemia due to antineoplastic chemotherapy: Secondary | ICD-10-CM | POA: Diagnosis not present

## 2019-09-27 LAB — BASIC METABOLIC PANEL
Anion gap: 10 (ref 5–15)
BUN: 10 mg/dL (ref 8–23)
CO2: 23 mmol/L (ref 22–32)
Calcium: 8.5 mg/dL — ABNORMAL LOW (ref 8.9–10.3)
Chloride: 100 mmol/L (ref 98–111)
Creatinine, Ser: 1.05 mg/dL (ref 0.61–1.24)
GFR calc Af Amer: >60 mL/min (ref >60)
GFR calc non Af Amer: >60 mL/min (ref >60)
Glucose, Bld: 140 mg/dL — ABNORMAL HIGH (ref 70–99)
Potassium: 3.8 mmol/L (ref 3.5–5.1)
Sodium: 133 mmol/L — ABNORMAL LOW (ref 135–145)

## 2019-09-27 LAB — CBC
HCT: 32.6 % — ABNORMAL LOW (ref 39.0–52.0)
Hemoglobin: 10.3 g/dL — ABNORMAL LOW (ref 13.0–17.0)
MCH: 29.3 pg (ref 26.0–34.0)
MCHC: 31.6 g/dL (ref 30.0–36.0)
MCV: 92.6 fL (ref 80.0–100.0)
Platelets: 232 10*3/uL (ref 150–400)
RBC: 3.52 MIL/uL — ABNORMAL LOW (ref 4.22–5.81)
RDW: 15.9 % — ABNORMAL HIGH (ref 11.5–15.5)
WBC: 4.1 10*3/uL (ref 4.0–10.5)
nRBC: 0 % (ref 0.0–0.2)

## 2019-09-27 NOTE — Progress Notes (Signed)
Patient's calcium was 8.5 today and with a corrected level would be around 8.8.  Consulted with Matt in the pharmacy and he said the cut-off was 8.9.  Patient informed of labwork.

## 2019-09-29 ENCOUNTER — Other Ambulatory Visit: Payer: Self-pay | Admitting: *Deleted

## 2019-09-29 DIAGNOSIS — G893 Neoplasm related pain (acute) (chronic): Secondary | ICD-10-CM

## 2019-09-29 DIAGNOSIS — F419 Anxiety disorder, unspecified: Secondary | ICD-10-CM

## 2019-09-29 MED ORDER — ALPRAZOLAM 0.5 MG PO TABS: 0.5000 mg | ORAL_TABLET | Freq: Four times a day (QID) | ORAL | 0 refills | Status: DC | PRN

## 2019-09-29 MED ORDER — OXYCODONE HCL 20 MG PO TABS: ORAL_TABLET | ORAL | 0 refills | Status: DC

## 2019-10-07 ENCOUNTER — Telehealth: Payer: Self-pay

## 2019-10-07 NOTE — Telephone Encounter (Signed)
Telephone call from Billey Chang NP at the cancer center.  Josh reports he has not talked with patient in a few months.  Josh reports he has asked nurse at the cancer center to schedule visit with him to see patient.  Josh requests [facility] team discharge patient until he can talk with patient.  Palliative care admin. Team updated.

## 2019-10-07 NOTE — Telephone Encounter (Signed)
Telephone call to patient to schedule palliative care visit.  Patient did not answer phone.  RN left message requesting call back to schedule visit.  RN also called and left message for Josh Borders at the cancer center to update Josh palliative care has been unable to reach patient.

## 2019-10-09 NOTE — Progress Notes (Signed)
[facility]  [address], [address] [place], [place] [address] Phone: [phone]  Fax: [phone]   Clinic Day:  10/11/2019  Referring physician: [name], NP  Chief Complaint: Jimmy West is a 69 y.o. male with metastatic adenocarcinoma of the lung who is seen for assessment prior to cycle #10 Alimta and pembrolizumab.   HPI: The patient was last seen in the medical oncology clinic on 09/20/2019. At that time, his neck felt somewhat better. He described intermittent midback pain. Activity level was minimal. Exam revealed slight tenderness at the base of his neck. Patient continued folic acid and Eliquis. He remained on oxycodone 10-20 mg every 4 hours prn. Patient received cycle #9 Alimta and pembrolizumab.   Labs included hematocrit 31.5, hemoglobin 9.7, platelets 380,000, WBC 7,900 (ALC 500).  Ferritin was 773 with an iron saturation of 14% and a TIBC of 349. Calcium 8.8. TSH 3.291 and free T4 0.85. Magnesium was 2.2.   Labs on 09/27/2019: Hematocrit 32.6, hemoglobin 10.3, platelets 232,000, WBC 4,100 . Calcium was 8.5 (low).  Delton See was held.  During the interim, the patient has been doing "pretty good". His pain is stable. He is having more neck pain than back pain today with associated shoulder pain. He is using his oxycodone 10-20 mg x 3-4 a day. Patient reports he has telemedicine visits with [name], NP and he will speak with him again on 11/01/2019.   He denies any cough or shortness of breath. He is taking 2 chewable calcium tablets 500 mg a day. CMP showed calcium 8.4 (corrected 9.08), albumin 3.2. CrCl is 41.8 ml/min. I advised him to continue calcium supplimentation. Patient understood and agreed.  He is currently not working and is unsure if his wants to retire. He reports feeling unmotivated and is not doing much in his spare time. He recently received his COVID-19 vaccine on 10/05/2019 and will get his second injection on  10/26/2019.   We discussed his chemotherapy induced anemia.  Patient agreed to [place].   Past Medical History:  Diagnosis Date  . Bulging lumbar disc   . Cancer ([place])    stage 4 lung cancer  . Heart attack [facility])     Past Surgical History:  Procedure Laterality Date  . CARDIAC CATHETERIZATION     two stents  . KNEE SURGERY Left   . PORTA CATH INSERTION N/A 05/21/2018   Procedure: PORTA CATH INSERTION;  Surgeon: Algernon Huxley, MD;  Location: [place];  Service: Cardiovascular;  Laterality: N/A;    Family History  Problem Relation Age of Onset  . Heart failure Father   . Cancer Maternal Aunt   . Heart failure Maternal Uncle   . Dementia Paternal Grandmother   . Cancer Maternal Aunt     Social History:  reports that he quit smoking about 15 years ago. His smoking use included cigarettes. He has a 22.50 pack-year smoking history. He has never used smokeless tobacco. He reports current alcohol use of about 15.0 standard drinks of alcohol per week. He reports that he does not use drugs. He started smoking at age 65. He is smoking 1 1/2 packs/day. Patient denies known exposures to radiation ortoxins. 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 He has not been working recently as the salon was closed. He plays golf occasionally.He has 8 cats. The patient is alone today.  Allergies: No Known Allergies  Current Medications: Current Outpatient Medications  Medication Sig  Dispense Refill  . acetaminophen (TYLENOL) 500 MG tablet Take 500 mg by mouth 3 (three) times daily as needed.    Marland Kitchen albuterol (VENTOLIN HFA) 108 (90 Base) MCG/ACT inhaler Inhale 2 puffs into the lungs every 6 (six) hours as needed for wheezing or shortness of breath. 18 g 3  . ALPRAZolam (XANAX) 0.5 MG tablet Take 1 tablet (0.5 mg total) by mouth 4 (four) times daily as needed for anxiety. 60 tablet 0  . apixaban (ELIQUIS) 5 MG TABS tablet Take 1 tablet (106m) twice daily 60 tablet 0   . Calcium 600-400 MG-UNIT CHEW Chew 2 tablets by mouth daily.    .Marland Kitchendexamethasone (DECADRON) 4 MG tablet Take 1 tab two times a day the day before Alimta chemo, then take 2 tabs once a day for 3 days starting the day after chemo. 30 tablet 1  . FLUoxetine (PROZAC) 40 MG capsule Take 1 capsule (40 mg total) by mouth daily. 30 capsule 3  . folic acid (FOLVITE) 1 MG tablet Take 1 tablet (1 mg total) by mouth daily. Start 5-7 days before Alimta chemotherapy. Continue until 21 days after Alimta completed. 100 tablet 3  . ibuprofen (ADVIL,MOTRIN) 200 MG tablet Take 200 mg by mouth every 6 (six) hours as needed.    . Oxycodone HCl 20 MG TABS TAKE 1/2 TO 1 TABLET BY MOUTH EVERY 4 HOURS AS NEEDED FOR SEVERE PAIN 60 tablet 0  . polyethylene glycol (MIRALAX / GLYCOLAX) packet Take 17 g by mouth daily.    .Marland Kitchensenna (SENOKOT) 8.6 MG tablet Take 1 tablet by mouth as needed.     . ondansetron (ZOFRAN ODT) 4 MG disintegrating tablet Take 1 tablet (4 mg total) by mouth every 6 (six) hours as needed for nausea or vomiting. (Patient not taking: Reported on 08/08/2019) 30 tablet 1   No current facility-administered medications for this visit.   Facility-Administered Medications Ordered in Other Visits  Medication Dose Route Frequency Provider Last Rate Last Admin  . 0.9 %  sodium chloride infusion   Intravenous Once Zhane Donlan C, MD      . 0.9 %  sodium chloride infusion   Intravenous Once PRN Regene Mccarthy C, MD      . albuterol (PROVENTIL) (2.5 MG/3ML) 0.083% nebulizer solution 2.5 mg  2.5 mg Nebulization Once PRN Jeniece Hannis C, MD      . alteplase (CATHFLO ACTIVASE) injection 2 mg  2 mg Intracatheter Once PRN CLequita Asal MD      . EPINEPHrine (ADRENALIN) 1 MG/10ML injection 0.25 mg  0.25 mg Intravenous Once PRN Katreena Schupp C, MD      . EPINEPHrine (ADRENALIN) 1 MG/10ML injection 0.25 mg  0.25 mg Intravenous Once PRN Lysa Livengood C, MD      . heparin lock flush 100 unit/mL  500  Units Intracatheter Once PRN CNolon StallsC, MD      . heparin lock flush 100 unit/mL  250 Units Intracatheter Once PRN CNolon StallsC, MD      . heparin lock flush 100 unit/mL  500 Units Intravenous Once Nikeisha Klutz C, MD      . sodium chloride flush (NS) 0.9 % injection 10 mL  10 mL Intravenous PRN CNolon StallsC, MD   10 mL at 03/04/19 0959  . sodium chloride flush (NS) 0.9 % injection 10 mL  10 mL Intracatheter Once PRN Cheyenna Pankowski C, MD      . sodium chloride flush (NS) 0.9 % injection 10  mL  10 mL Intravenous PRN Junetta Hearn C, MD      . sodium chloride flush (NS) 0.9 % injection 3 mL  3 mL Intracatheter Once PRN Lequita Asal, MD        Review of Systems  Constitutional: Negative for chills, diaphoresis, fever, malaise/fatigue and weight loss (stable).       Feeling "pretty good".  HENT: Negative for congestion, ear discharge, ear pain, hearing loss, nosebleeds, sinus pain, sore throat and tinnitus.   Eyes: Negative.  Negative for blurred vision, double vision and photophobia.  Respiratory: Negative for cough (chronic, better), hemoptysis, sputum production and shortness of breath.   Cardiovascular: Negative.  Negative for chest pain, palpitations and leg swelling.  Gastrointestinal: Negative.  Negative for abdominal pain, blood in stool, constipation, diarrhea, heartburn, melena, nausea and vomiting.  Genitourinary: Negative.  Negative for dysuria, hematuria and urgency.  Musculoskeletal: Positive for back pain (comes and goes), joint pain (shoulders) and neck pain (3-4/10). Negative for myalgias.  Skin: Negative for itching and rash.  Neurological: Negative.  Negative for dizziness, tingling, sensory change, weakness and headaches.  Endo/Heme/Allergies: Negative.  Does not bruise/bleed easily.  Psychiatric/Behavioral: Negative.  Negative for depression and memory loss. The patient is not nervous/anxious (chronic of Xanax) and does not have  insomnia.   All other systems reviewed and are negative.  Performance status (ECOG): 1-2  Vitals Blood pressure 121/81, pulse 82, temperature 98.4 F (36.9 C), temperature source Oral, resp. rate 16, weight 139 lb (63 kg), SpO2 99 %.   Physical Exam  Constitutional: He is oriented to person, place, and time. He appears well-developed and well-nourished. No distress.  HENT:  Head: Normocephalic and atraumatic.  Mouth/Throat: Oropharynx is clear and moist. No oropharyngeal exudate.  Thin brown graying hair.  Mustache.  Dentures.  Mask.  Eyes: Pupils are equal, round, and reactive to light. Conjunctivae and EOM are normal. No scleral icterus.  Glasses. Gray blue eyes.  Neck: No JVD present.  Cardiovascular: Normal rate, regular rhythm and normal heart sounds.  No murmur heard. Pulmonary/Chest: Effort normal and breath sounds normal. No respiratory distress. He has no wheezes. He has no rales. He exhibits no tenderness.  Abdominal: Soft. Bowel sounds are normal. He exhibits no distension and no mass. There is no abdominal tenderness. There is no rebound and no guarding.  Musculoskeletal:        General: No tenderness or edema. Normal range of motion.     Cervical back: Normal range of motion and neck supple.  Lymphadenopathy:       Head (right side): No preauricular, no posterior auricular and no occipital adenopathy present.       Head (left side): No preauricular, no posterior auricular and no occipital adenopathy present.    He has no cervical adenopathy.    He has no axillary adenopathy.       Right: No inguinal and no supraclavicular adenopathy present.       Left: No inguinal and no supraclavicular adenopathy present.  Neurological: He is alert and oriented to person, place, and time.  Skin: Skin is warm and dry. He is not diaphoretic.  Psychiatric: He has a normal mood and affect. His behavior is normal. Judgment and thought content normal.  Nursing note and vitals  reviewed.   Infusion on 10/11/2019  Component Date Value Ref Range Status  . WBC 10/11/2019 9.3  4.0 - 10.5 K/uL Final  . RBC 10/11/2019 3.28* 4.22 - 5.81 MIL/uL Final  .  Hemoglobin 10/11/2019 9.4* 13.0 - 17.0 g/dL Final  . HCT 10/11/2019 30.8* 39.0 - 52.0 % Final  . MCV 10/11/2019 93.9  80.0 - 100.0 fL Final  . MCH 10/11/2019 28.7  26.0 - 34.0 pg Final  . MCHC 10/11/2019 30.5  30.0 - 36.0 g/dL Final  . RDW 10/11/2019 16.1* 11.5 - 15.5 % Final  . Platelets 10/11/2019 440* 150 - 400 K/uL Final  . nRBC 10/11/2019 0.0  0.0 - 0.2 % Final  . Neutrophils Relative % 10/11/2019 82  % Final  . Neutro Abs 10/11/2019 7.6  1.7 - 7.7 K/uL Final  . Lymphocytes Relative 10/11/2019 9  % Final  . Lymphs Abs 10/11/2019 0.8  0.7 - 4.0 K/uL Final  . Monocytes Relative 10/11/2019 8  % Final  . Monocytes Absolute 10/11/2019 0.7  0.1 - 1.0 K/uL Final  . Eosinophils Relative 10/11/2019 0  % Final  . Eosinophils Absolute 10/11/2019 0.0  0.0 - 0.5 K/uL Final  . Basophils Relative 10/11/2019 0  % Final  . Basophils Absolute 10/11/2019 0.0  0.0 - 0.1 K/uL Final  . Immature Granulocytes 10/11/2019 1  % Final  . Abs Immature Granulocytes 10/11/2019 0.06  0.00 - 0.07 K/uL Final   Performed at [facility], [address], [place], [place] [address]    Assessment:  Jimmy West is a 69 y.o. male with metastatichigh-grade adenocarcinomaof the right lungs/p CT-guided biopsy of a RLL lung mass on 05/11/2018. Pathologyrevealed an invasive high-grade adenocarcinoma, with predominantly solid growth pattern. The neoplastic cells were TTF-1 (+), Napsin A (+), and P40 (+). He has a T4 vertebral metastasis. Clinical stage is T4N1M1.  There was not enough material for Foundation One testing. PDL-1revealed TPS 90%.  PET scanon 04/30/2018 revealed a 3.4 cm hypermetabolic RLL pulmonary mass (SUV 13.2), 11 mm pulmonary nodule in the LEFT lung (SUV 4.5), RIGHT paratracheal lymph node (SUV 5.1),  and hypermetabolic activity within the T4 vertebral body (SUV 10.2).   Thoracic spine MRIon 05/03/2018 revealed T4 metastasiswith a 40% pathologic compression deformity and 5 mm of retropulsion of the vertebral body. Retropulsion results in mild spinal canal stenosis and mild bilateral C4-5 foraminal stenosis. There was paravertebral soft tissue thickening from mid T3 to mid T5, likely representing edema related to the pathologic compression deformity vs. possible extraosseous extension of the neoplasm. There were no additional thoracic spinal metastases noted.   Head MRIon 05/03/2018 revealed no intracranial metastatic disease. 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 Incidental mention made of mild paranasal sinus disease.  Hereceived 11cycles ofpembrolizumab(05/24/2018 - 02/07/2019). He toleratedtreatment well. CEAwas 1.3 on 08/30/2018.LDHwas 165 on 11/29/2018.  He completed T4 radiationon 07/07/2018. He receives Xgevamonthly (06/03/2018 -07/18/2019).  He received 1 cycle of carboplatin, Alimta, and pembrolizumabon 02/08/2019. Cycle #1 was complicated by nausea, vomiting, and diarrhea necessitating hospitalization.Decision made to hold carboplatin with his second dose.He iss/p2cycles ofAlimta and pembrolizumab (02/28/2019 - 03/29/2019).Cycle #2 was complicated by nausea, vomiting, and dehydration requring fluids in clinic and an overnight stay in the hospital. Hereceivedcarboplatin (AUC 2), Alimta, and pebrolizumab(04/18/2019).He tolerated it poorly.   He has receivedcycle #9Alimta and pembrolizumabon 05/09/2019(last03/08/2019). He tolerated well. He receives B12every 9 weeks (last03/07/2019).  He hascancer-related painin T4. He is currently taking Fentanyl75 mcg/hr  and oxycodone10-20 mg every 4 hours prn.  Cervical and thoracic spine CT at [facility] 01/10/2019 revealed  unchanged severe compression deformity/vertebral plana of T4 with a stable degree of retropulsion and focal mild spinal canal stenosis. There was interval enlargement of a right lower lobe pulmonary mass(4.5 x 3.5 cm compared to 3.9 x 2.9 cm)with redemonstrated spiculated pulmonary nodules.  Chest, abdomen, pelvisCTon 01/28/2019 revealed slight interval enlargement of a right lower lobe mass with central necrosis measuring 4.6 x 3.4 cm, previously 4.0 x 3.0 cm. There was no change in right upper lobe nodules(1.4 and 0.8 cm).Therewas almost no residua of left lung nodules, with irregular opacities in the apical left upper lobeandsuperior segment left lower lobe. There was no change in right hilar soft tissue and lymph nodes.There wasvertebra plana deformity of T4andno evidence of new osseous metastatic disease. No evidence of distant metastatic disease in the abdomen or pelvis.The1.6 x 1.1 cmleft adrenal nodule(non-metabolic on prior PET scan),was unchanged.  Abdomen and pelvis CTon 04/23/2019 revealed no acute abdominal or pelvic pathology. There was fluid in the colon which can be seen with diarrhea. There was diverticulosis without evidence of diverticulitis. There was a stable 1.5 cm left adrenal nodule.  Chest, abdomen, and pelvisCTon 10/16/2020revealed a positive response to interval therapy for the known right lower lobe lung carcinoma(4.6 x 3.4 cm to3.1 x 2.8 cm transversely). There hadbeen a mild interval decrease contiguous soft tissue that extends along the right hilum. The two right upper lobe noduleswerestable from the most recent prior study (smaller than 04/27/2018).There are no new lung nodules.Therewasno evidence of new metastatic disease.There was stable severe compression deformity/vertebra plana of T4.Therewasno evidence of other osseous  metastatic disease.There was a stable left adrenal nodule consistent with an adenoma.  ChestCT angiogramon 05/30/2019 revealed a tiny subsegmental pulmonary embolusin the right lower lobe. There areas of ground-glass at the periphery of the right chest hadbecome more conspicuous. This mayrelate to mild pneumonitis, perhaps due to radiation, attention on follow-up.The dominant nodule in the right lung basewasstable in size. Heis onEliquis.  Chest, abdomen and pelvis CT on 09/16/2019 revealed a similar-appearing spiculated right lower lobe nodule. There was no evidence for metastatic disease in the abdomen or pelvis. There was stable left adrenal adenoma.  He has cervicalgia.  Cervical spine MRIon 08/14/2019 showed no evidence of metastatic disease within the cervical spine. He is followed by Dr Izora Ribas.  Hehad transient renal insufficiencyon 03/23/2019. Creatinine was 1.32 (baseline 0.61-1.0). Renal ultrasoundon 03/24/2019 revealed no acute abnormality identified.There was no hydronephrosis or bladder distention.Creatinine is 0.82 today.  Stool waspositive for C difficile + diarrheaon 06/14/2019.Hebeganoral vancomycinbeginning11/27/2020.  He received the influenza vaccineon 05/16/2019.  Symptomatically, he is doing well.  Pain is well controlled.  HE denies any respiratory symptoms.  Exam is stable.  Corrected calcium is 9.08.  Plan: 1.   Labs today: CBC with diff, CMP, TSH, free T4. 2. Metastatic high-grade adenocarcinoma the RIGHT lung He is s/p 11 cycles of pembrolizumab. He is s/p 1 cycle of carboplatin, Almita, and pembrolizumab (02/08/2019). He is s/p 1 cycle of carboplatin (AUC 2), Alimta, and pembrolizumab (04/18/2019). He is s/p9cycles of Alimta and pembrolizumab (02/28/2019 - 03/29/2019; 05/09/2019-08/30/2019). Hecontinues treatment without carboplatin. Restaging CT scans on 09/16/2019 revealed stable disease.              Discuss continued maintenance treatment.. Labs reviewed.  Begin cycle #10 Alimta and pembrolizumab. Patient confirmed that he took his Decadron premedication yesterday. Continue B12 every 9 weeks (last03/07/2019; next due 11/22/2019). Continue folic acid. Discuss symptom management.  He has antiemetics and pain medications at home to use on a  prn bases.  Interventions are adequate.    3. Bone metastasis Symptomatically, he is doing well. Last Xgeva on02/03/2020. Discuss plan to restart Xgeva. 4. Cancer related pain Pain due to malignancy is well controlled.             He has intermittent mid back pain Pain medications written by [name], NP (palliative care medicine). 5. Cervical spine pain Etiology unrelated to malignancy. Cervical spine MRI on 08/11/2018 revealed no metastasis. Patient has been seen by Dr. Izora Ribas. 6.Anxiety Continue low dose Xanax. 7.Pulmonary embolism             Patient remains on long-term anticoagulation (Eliquis).              Ensure platelets > 50,000 8.Chemotherapy induced anemia Hematocrit30.8. Hemoglobin9.4. MCV93.9today. Ferritin 773 with an iron saturation of 14% and a TIBC of 349 on 09/20/2019. Discuss plan for Retacrit.   Potential side effects reviewed.   Information provided.   Preauth Retacrit. Anticipate Retacritevery 2 weeksper protocol. 9.   RTC in 10 days for labs (CBC with diff, BMP) and +/- Retacrit and Xgeva. 10.   RTC in 3 weeks for MD assessment, labs (CBC with diff, CMP, TSH, free T4), and cycle #11 Alimta + pembrolizumab.  I discussed the assessment and treatment plan with the patient.  The patient was provided an opportunity to ask questions and all were answered.  The patient agreed with the plan and  demonstrated an understanding of the instructions.  The patient was advised to call back if the symptoms worsen or if the condition fails to improve as anticipated.   Lequita Asal, MD, PhD    10/11/2019, 9:57 AM  I, Selena Batten, am acting as scribe for [employer]. Mike Gip, MD, PhD.  I, Scarlette Hogston C. Mike Gip, MD, have reviewed the above documentation for accuracy and completeness, and I agree with the above.

## 2019-10-11 ENCOUNTER — Other Ambulatory Visit: Payer: Self-pay | Admitting: Hematology and Oncology

## 2019-10-11 ENCOUNTER — Other Ambulatory Visit: Payer: Self-pay

## 2019-10-11 ENCOUNTER — Inpatient Hospital Stay: Payer: Medicare Other

## 2019-10-11 ENCOUNTER — Inpatient Hospital Stay (HOSPITAL_BASED_OUTPATIENT_CLINIC_OR_DEPARTMENT_OTHER): Payer: Medicare Other | Admitting: Hematology and Oncology

## 2019-10-11 ENCOUNTER — Encounter: Payer: Self-pay | Admitting: Hematology and Oncology

## 2019-10-11 VITALS — BP 135/75 | HR 66 | Temp 97.5°F | Resp 16

## 2019-10-11 VITALS — BP 121/81 | HR 82 | Temp 98.4°F | Resp 16 | Wt 139.0 lb

## 2019-10-11 DIAGNOSIS — D6481 Anemia due to antineoplastic chemotherapy: Secondary | ICD-10-CM

## 2019-10-11 DIAGNOSIS — I2782 Chronic pulmonary embolism: Secondary | ICD-10-CM | POA: Diagnosis not present

## 2019-10-11 DIAGNOSIS — T451X5A Adverse effect of antineoplastic and immunosuppressive drugs, initial encounter: Secondary | ICD-10-CM

## 2019-10-11 DIAGNOSIS — C7951 Secondary malignant neoplasm of bone: Secondary | ICD-10-CM

## 2019-10-11 DIAGNOSIS — G893 Neoplasm related pain (acute) (chronic): Secondary | ICD-10-CM

## 2019-10-11 DIAGNOSIS — Z5111 Encounter for antineoplastic chemotherapy: Secondary | ICD-10-CM

## 2019-10-11 DIAGNOSIS — C3431 Malignant neoplasm of lower lobe, right bronchus or lung: Secondary | ICD-10-CM

## 2019-10-11 DIAGNOSIS — Z5112 Encounter for antineoplastic immunotherapy: Secondary | ICD-10-CM | POA: Diagnosis not present

## 2019-10-11 DIAGNOSIS — F419 Anxiety disorder, unspecified: Secondary | ICD-10-CM | POA: Diagnosis not present

## 2019-10-11 LAB — CBC WITH DIFFERENTIAL/PLATELET
Abs Immature Granulocytes: 0.06 10*3/uL (ref 0.00–0.07)
Basophils Absolute: 0 10*3/uL (ref 0.0–0.1)
Basophils Relative: 0 %
Eosinophils Absolute: 0 10*3/uL (ref 0.0–0.5)
Eosinophils Relative: 0 %
HCT: 30.8 % — ABNORMAL LOW (ref 39.0–52.0)
Hemoglobin: 9.4 g/dL — ABNORMAL LOW (ref 13.0–17.0)
Immature Granulocytes: 1 %
Lymphocytes Relative: 9 %
Lymphs Abs: 0.8 10*3/uL (ref 0.7–4.0)
MCH: 28.7 pg (ref 26.0–34.0)
MCHC: 30.5 g/dL (ref 30.0–36.0)
MCV: 93.9 fL (ref 80.0–100.0)
Monocytes Absolute: 0.7 10*3/uL (ref 0.1–1.0)
Monocytes Relative: 8 %
Neutro Abs: 7.6 10*3/uL (ref 1.7–7.7)
Neutrophils Relative %: 82 %
Platelets: 440 10*3/uL — ABNORMAL HIGH (ref 150–400)
RBC: 3.28 MIL/uL — ABNORMAL LOW (ref 4.22–5.81)
RDW: 16.1 % — ABNORMAL HIGH (ref 11.5–15.5)
WBC: 9.3 10*3/uL (ref 4.0–10.5)
nRBC: 0 % (ref 0.0–0.2)

## 2019-10-11 LAB — COMPREHENSIVE METABOLIC PANEL
ALT: 9 U/L (ref 0–44)
AST: 21 U/L (ref 15–41)
Albumin: 3.2 g/dL — ABNORMAL LOW (ref 3.5–5.0)
Alkaline Phosphatase: 78 U/L (ref 38–126)
Anion gap: 11 (ref 5–15)
BUN: 11 mg/dL (ref 8–23)
CO2: 20 mmol/L — ABNORMAL LOW (ref 22–32)
Calcium: 8.4 mg/dL — ABNORMAL LOW (ref 8.9–10.3)
Chloride: 105 mmol/L (ref 98–111)
Creatinine, Ser: 1.06 mg/dL (ref 0.61–1.24)
GFR calc Af Amer: >60 mL/min (ref >60)
GFR calc non Af Amer: >60 mL/min (ref >60)
Glucose, Bld: 159 mg/dL — ABNORMAL HIGH (ref 70–99)
Potassium: 3.5 mmol/L (ref 3.5–5.1)
Sodium: 136 mmol/L (ref 135–145)
Total Bilirubin: 0.6 mg/dL (ref 0.3–1.2)
Total Protein: 6.6 g/dL (ref 6.5–8.1)

## 2019-10-11 LAB — T4, FREE: Free T4: 0.93 ng/dL (ref 0.61–1.12)

## 2019-10-11 LAB — TSH: TSH: 0.643 u[IU]/mL (ref 0.350–4.500)

## 2019-10-11 MED ORDER — SODIUM CHLORIDE 0.9 % IV SOLN
500.0000 mg/m2 | Freq: Once | INTRAVENOUS | Status: AC
  Administered 2019-10-11: 900 mg via INTRAVENOUS
  Filled 2019-10-11: qty 20

## 2019-10-11 MED ORDER — PALONOSETRON HCL INJECTION 0.25 MG/5ML
0.2500 mg | Freq: Once | INTRAVENOUS | Status: AC
  Administered 2019-10-11: 0.25 mg via INTRAVENOUS

## 2019-10-11 MED ORDER — HEPARIN SOD (PORK) LOCK FLUSH 100 UNIT/ML IV SOLN
500.0000 [IU] | Freq: Once | INTRAVENOUS | Status: AC
  Administered 2019-10-11: 500 [IU] via INTRAVENOUS
  Filled 2019-10-11: qty 5

## 2019-10-11 MED ORDER — SODIUM CHLORIDE 0.9 % IV SOLN
200.0000 mg | Freq: Once | INTRAVENOUS | Status: AC
  Administered 2019-10-11: 200 mg via INTRAVENOUS
  Filled 2019-10-11: qty 8

## 2019-10-11 MED ORDER — SODIUM CHLORIDE 0.9% FLUSH
10.0000 mL | INTRAVENOUS | Status: DC | PRN
  Administered 2019-10-11: 10 mL via INTRAVENOUS
  Filled 2019-10-11: qty 10

## 2019-10-11 MED ORDER — SODIUM CHLORIDE 0.9 % IV SOLN
Freq: Once | INTRAVENOUS | Status: AC
  Filled 2019-10-11: qty 250

## 2019-10-11 NOTE — Progress Notes (Signed)
Patient here for follow up. Denies any concerns.  

## 2019-10-11 NOTE — Patient Instructions (Signed)

## 2019-10-14 ENCOUNTER — Other Ambulatory Visit: Payer: Self-pay | Admitting: *Deleted

## 2019-10-14 DIAGNOSIS — G893 Neoplasm related pain (acute) (chronic): Secondary | ICD-10-CM

## 2019-10-14 DIAGNOSIS — F419 Anxiety disorder, unspecified: Secondary | ICD-10-CM

## 2019-10-14 DIAGNOSIS — C3431 Malignant neoplasm of lower lobe, right bronchus or lung: Secondary | ICD-10-CM

## 2019-10-14 MED ORDER — FLUOXETINE HCL 40 MG PO CAPS: 40.0000 mg | ORAL_CAPSULE | Freq: Every day | ORAL | 3 refills | Status: DC

## 2019-10-14 MED ORDER — OXYCODONE HCL 20 MG PO TABS: ORAL_TABLET | ORAL | 0 refills | Status: DC

## 2019-10-14 MED ORDER — ALPRAZOLAM 0.5 MG PO TABS: 0.5000 mg | ORAL_TABLET | Freq: Four times a day (QID) | ORAL | 0 refills | Status: DC | PRN

## 2019-10-14 MED ORDER — FOLIC ACID 1 MG PO TABS: 1.0000 mg | ORAL_TABLET | Freq: Every day | ORAL | 3 refills | Status: DC

## 2019-10-20 ENCOUNTER — Other Ambulatory Visit: Payer: Self-pay | Admitting: *Deleted

## 2019-10-20 ENCOUNTER — Inpatient Hospital Stay: Payer: Medicare Other | Attending: Nurse Practitioner

## 2019-10-20 ENCOUNTER — Encounter: Payer: Self-pay | Admitting: Nurse Practitioner

## 2019-10-20 ENCOUNTER — Inpatient Hospital Stay (HOSPITAL_BASED_OUTPATIENT_CLINIC_OR_DEPARTMENT_OTHER): Payer: Medicare Other | Admitting: Nurse Practitioner

## 2019-10-20 ENCOUNTER — Emergency Department
Admission: EM | Admit: 2019-10-20 | Discharge: 2019-10-20 | Disposition: A | Discharge status: Home / Self Care | Payer: Medicare Other | Attending: Emergency Medicine | Admitting: Emergency Medicine

## 2019-10-20 ENCOUNTER — Other Ambulatory Visit: Payer: Self-pay

## 2019-10-20 ENCOUNTER — Emergency Department: Payer: Medicare Other

## 2019-10-20 ENCOUNTER — Inpatient Hospital Stay: Payer: Medicare Other

## 2019-10-20 ENCOUNTER — Telehealth: Payer: Self-pay

## 2019-10-20 VITALS — BP 112/76 | HR 89 | Temp 100.3°F | Resp 18 | Wt 135.9 lb

## 2019-10-20 DIAGNOSIS — Z20822 Contact with and (suspected) exposure to covid-19: Secondary | ICD-10-CM | POA: Diagnosis not present

## 2019-10-20 DIAGNOSIS — D709 Neutropenia, unspecified: Secondary | ICD-10-CM | POA: Insufficient documentation

## 2019-10-20 DIAGNOSIS — M7918 Myalgia, other site: Secondary | ICD-10-CM | POA: Diagnosis not present

## 2019-10-20 DIAGNOSIS — R6883 Chills (without fever): Secondary | ICD-10-CM

## 2019-10-20 DIAGNOSIS — R531 Weakness: Secondary | ICD-10-CM

## 2019-10-20 DIAGNOSIS — R52 Pain, unspecified: Secondary | ICD-10-CM

## 2019-10-20 DIAGNOSIS — R112 Nausea with vomiting, unspecified: Secondary | ICD-10-CM

## 2019-10-20 DIAGNOSIS — Z79899 Other long term (current) drug therapy: Secondary | ICD-10-CM | POA: Insufficient documentation

## 2019-10-20 DIAGNOSIS — R7989 Other specified abnormal findings of blood chemistry: Secondary | ICD-10-CM | POA: Diagnosis not present

## 2019-10-20 DIAGNOSIS — B349 Viral infection, unspecified: Secondary | ICD-10-CM

## 2019-10-20 DIAGNOSIS — R509 Fever, unspecified: Secondary | ICD-10-CM | POA: Diagnosis not present

## 2019-10-20 DIAGNOSIS — C3490 Malignant neoplasm of unspecified part of unspecified bronchus or lung: Secondary | ICD-10-CM | POA: Insufficient documentation

## 2019-10-20 DIAGNOSIS — R5081 Fever presenting with conditions classified elsewhere: Secondary | ICD-10-CM | POA: Diagnosis not present

## 2019-10-20 DIAGNOSIS — I252 Old myocardial infarction: Secondary | ICD-10-CM | POA: Insufficient documentation

## 2019-10-20 DIAGNOSIS — C3431 Malignant neoplasm of lower lobe, right bronchus or lung: Secondary | ICD-10-CM

## 2019-10-20 DIAGNOSIS — E860 Dehydration: Secondary | ICD-10-CM

## 2019-10-20 DIAGNOSIS — Z7952 Long term (current) use of systemic steroids: Secondary | ICD-10-CM | POA: Insufficient documentation

## 2019-10-20 DIAGNOSIS — Z87891 Personal history of nicotine dependence: Secondary | ICD-10-CM | POA: Insufficient documentation

## 2019-10-20 DIAGNOSIS — Z7901 Long term (current) use of anticoagulants: Secondary | ICD-10-CM | POA: Insufficient documentation

## 2019-10-20 LAB — CBC WITH DIFFERENTIAL/PLATELET
Abs Immature Granulocytes: 0.01 10*3/uL (ref 0.00–0.07)
Basophils Absolute: 0 10*3/uL (ref 0.0–0.1)
Basophils Relative: 1 %
Eosinophils Absolute: 0.2 10*3/uL (ref 0.0–0.5)
Eosinophils Relative: 9 %
HCT: 27.9 % — ABNORMAL LOW (ref 39.0–52.0)
Hemoglobin: 8.9 g/dL — ABNORMAL LOW (ref 13.0–17.0)
Immature Granulocytes: 0 %
Lymphocytes Relative: 21 %
Lymphs Abs: 0.5 10*3/uL — ABNORMAL LOW (ref 0.7–4.0)
MCH: 29 pg (ref 26.0–34.0)
MCHC: 31.9 g/dL (ref 30.0–36.0)
MCV: 90.9 fL (ref 80.0–100.0)
Monocytes Absolute: 0.5 10*3/uL (ref 0.1–1.0)
Monocytes Relative: 23 %
Neutro Abs: 1 10*3/uL — ABNORMAL LOW (ref 1.7–7.7)
Neutrophils Relative %: 46 %
Platelets: 88 10*3/uL — ABNORMAL LOW (ref 150–400)
RBC: 3.07 MIL/uL — ABNORMAL LOW (ref 4.22–5.81)
RDW: 15.1 % (ref 11.5–15.5)
WBC: 2.3 10*3/uL — ABNORMAL LOW (ref 4.0–10.5)
nRBC: 0 % (ref 0.0–0.2)

## 2019-10-20 LAB — COMPREHENSIVE METABOLIC PANEL
ALT: 16 U/L (ref 0–44)
AST: 24 U/L (ref 15–41)
Albumin: 3.4 g/dL — ABNORMAL LOW (ref 3.5–5.0)
Alkaline Phosphatase: 65 U/L (ref 38–126)
Anion gap: 12 (ref 5–15)
BUN: 17 mg/dL (ref 8–23)
CO2: 25 mmol/L (ref 22–32)
Calcium: 8.6 mg/dL — ABNORMAL LOW (ref 8.9–10.3)
Chloride: 101 mmol/L (ref 98–111)
Creatinine, Ser: 1.49 mg/dL — ABNORMAL HIGH (ref 0.61–1.24)
GFR calc Af Amer: 55 mL/min — ABNORMAL LOW (ref >60)
GFR calc non Af Amer: 47 mL/min — ABNORMAL LOW (ref >60)
Glucose, Bld: 128 mg/dL — ABNORMAL HIGH (ref 70–99)
Potassium: 3.6 mmol/L (ref 3.5–5.1)
Sodium: 138 mmol/L (ref 135–145)
Total Bilirubin: 1.6 mg/dL — ABNORMAL HIGH (ref 0.3–1.2)
Total Protein: 7.3 g/dL (ref 6.5–8.1)

## 2019-10-20 LAB — URINALYSIS, COMPLETE (UACMP) WITH MICROSCOPIC
Bacteria, UA: NONE SEEN
Bilirubin Urine: NEGATIVE
Glucose, UA: NEGATIVE mg/dL
Ketones, ur: NEGATIVE mg/dL
Leukocytes,Ua: NEGATIVE
Nitrite: NEGATIVE
Protein, ur: 100 mg/dL — AB
Specific Gravity, Urine: 1.018 (ref 1.005–1.030)
pH: 5 (ref 5.0–8.0)

## 2019-10-20 LAB — MAGNESIUM: Magnesium: 1.7 mg/dL (ref 1.7–2.4)

## 2019-10-20 LAB — RESPIRATORY PANEL BY RT PCR (FLU A&B, COVID)
Influenza A by PCR: NEGATIVE
Influenza B by PCR: NEGATIVE
SARS Coronavirus 2 by RT PCR: NEGATIVE

## 2019-10-20 MED ORDER — SODIUM CHLORIDE 0.9 % IV SOLN
Freq: Once | INTRAVENOUS | Status: AC
  Filled 2019-10-20: qty 250

## 2019-10-20 MED ORDER — LEVOFLOXACIN 750 MG PO TABS
750.0000 mg | ORAL_TABLET | Freq: Once | ORAL | Status: AC
  Administered 2019-10-20: 750 mg via ORAL
  Filled 2019-10-20: qty 1

## 2019-10-20 MED ORDER — LEVOFLOXACIN 750 MG PO TABS: 750.0000 mg | ORAL_TABLET | Freq: Every day | ORAL | 0 refills | Status: DC

## 2019-10-20 NOTE — ED Provider Notes (Signed)
[facility] Emergency Department Provider Note       Time seen: ----------------------------------------- 6:11 PM on 10/20/2019 -----------------------------------------   I have reviewed the triage vital signs and the nursing notes.  HISTORY   Chief Complaint Fever   HPI Jimmy West is a 69 y.o. male with a history of stage IV lung cancer, MI, PE, malnutrition who presents to the ED for fever.  Patient has had some chills and body aches all over.  He reports he has stage IV lung cancer, had chemotherapy last week.  He got his first Covid vaccination 2 weeks ago and has felt poorly since.  Past Medical History:  Diagnosis Date  . Bulging lumbar disc   . Cancer ([place])    stage 4 lung cancer  . Heart attack [facility])     Patient Active Problem List   Diagnosis Date Noted  . Palliative care encounter 08/26/2019  . Anemia due to antineoplastic chemotherapy 06/27/2019  . C. difficile diarrhea 06/26/2019  . Pulmonary embolus ([place]) 06/20/2019  . Anxiety in cancer patient 03/29/2019  . Renal insufficiency 03/29/2019  . Other fatigue 03/04/2019  . Vomiting 03/04/2019  . Chemotherapy induced nausea and vomiting 02/12/2019  . Abdominal pain 02/11/2019  . Mild protein-calorie malnutrition ([place]) 10/10/2018  . Weight loss 09/20/2018  . Elevated bilirubin 07/14/2018  . Encounter for antineoplastic chemotherapy 05/24/2018  . Encounter for antineoplastic immunotherapy 05/24/2018  . Goals of care, counseling/discussion 05/17/2018  . Cancer-related pain 05/01/2018  . 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 Primary cancer of right lower lobe of lung ([place]) 04/26/2018  . Chronic midline low back pain without sciatica 04/26/2018    Past Surgical History:  Procedure Laterality Date  . CARDIAC CATHETERIZATION     two stents  . KNEE SURGERY Left   . PORTA CATH INSERTION N/A 05/21/2018   Procedure: PORTA CATH INSERTION;  Surgeon: Algernon Huxley, MD;  Location:  [place] CV LAB;  Service: Cardiovascular;  Laterality: N/A;    Allergies Patient has no known allergies.  Social History Social History   Tobacco Use  . Smoking status: Former Smoker    Packs/day: 1.50    Years: 15.00    Pack years: 22.50    Types: Cigarettes    Quit date: 11/03/2003    Years since quitting: 15.9  . Smokeless tobacco: Never Used  Substance Use Topics  . Alcohol use: Yes    Alcohol/week: 15.0 standard drinks    Types: 15 Shots of liquor per week  . Drug use: No    Review of Systems Constitutional: Positive for fever, chills Cardiovascular: Negative for chest pain. Respiratory: Negative for shortness of breath. Gastrointestinal: Negative for abdominal pain, vomiting and diarrhea. Musculoskeletal: Negative for back pain.  Positive for body aches Skin: Negative for rash. Neurological: Negative for headaches, focal weakness or numbness.  All systems negative/normal/unremarkable except as stated in the HPI  ____________________________________________   PHYSICAL EXAM:  VITAL SIGNS: ED Triage Vitals [10/20/19 1538]  Enc Vitals Group     BP (!) 143/107     Pulse Rate 98     Resp 19     Temp 98.5 F (36.9 C)     Temp src      SpO2 99 %     Weight 135 lb 14.4 oz (61.6 kg)     Height 5\' 6"  (1.676 m)     Head Circumference      Peak Flow      Pain Score 5  Pain Loc      Pain Edu?      Excl. in [place]?     Constitutional: Alert and oriented. Well appearing and in no distress. Eyes: Conjunctivae are normal. Normal extraocular movements. Cardiovascular: Normal rate, regular rhythm. No murmurs, rubs, or gallops. Respiratory: Normal respiratory effort without tachypnea nor retractions. Breath sounds are clear and equal bilaterally. No wheezes/rales/rhonchi. Gastrointestinal: Soft and nontender. Normal bowel sounds Musculoskeletal: Nontender with normal range of motion in extremities. No lower extremity tenderness nor edema. Neurologic:  Normal  speech and language. No gross focal neurologic deficits are appreciated.  Skin:  Skin is warm, dry and intact. No rash noted. Psychiatric: Mood and affect are normal. Speech and behavior are normal.  ___________________________________________  ED COURSE:  As part of my medical decision making, I reviewed the following data within the [place] History obtained from family if available, nursing notes, old chart and ekg, as well as notes from prior ED visits. Patient presented for flulike symptoms, we will assess with labs and imaging as indicated at this time.   Procedures  Jimmy West was evaluated in Emergency Department on 10/20/2019 for the symptoms described in the history of present illness. He was evaluated in the context of the global COVID-19 pandemic, which necessitated consideration that the patient might be at risk for infection with the SARS-CoV-2 virus that causes COVID-19. Institutional protocols and algorithms that pertain to the evaluation of patients at risk for COVID-19 are in a state of rapid change based on information released by regulatory bodies including the CDC and federal and state organizations. These policies and algorithms were followed during the patient's care in the ED.  ____________________________________________   LABS (pertinent positives/negatives)  Labs Reviewed  RESPIRATORY PANEL BY RT PCR (FLU A&B, COVID)  POC SARS CORONAVIRUS 2 AG -  ED    RADIOLOGY Images were viewed by me  Chest x-ray  IMPRESSION:  1. New mild patchy left costophrenic angle opacity, which could  represent atelectasis, aspiration and/or pneumonia.  2. Suggestion of new small left pleural effusion.  3. Stable spiculated right infrahilar nodule.  ____________________________________________   DIFFERENTIAL DIAGNOSIS   COVID-19 vaccination symptoms, COVID-19, sepsis, dehydration, electrolyte abnormality  FINAL ASSESSMENT AND PLAN  Fever, possible  pneumonia   Plan: The patient had presented for fever of unknown origin. Patient's labs were overall reassuring performed at the cancer center.  He was given fluids there as well. Patient's imaging revealed a mild patchy left costophrenic angle opacity of uncertain etiology.  He does have a history of lung cancer.  Given his unexplained fever I have placed him on Levaquin.  Otherwise his labs today have been normal.  He is cleared for outpatient follow-up.   Laurence Aly, MD    Note: This note was generated in part or whole with voice recognition software. Voice recognition is usually quite accurate but there are transcription errors that can and very often do occur. I apologize for any typographical errors that were not detected and corrected.     Earleen Newport, MD 10/20/19 [phone]

## 2019-10-20 NOTE — Telephone Encounter (Signed)
Ms [name] called into the office and reports that Mr Subramaniam has been vomiting x 2 days with body aches, chills. They are not for sure if he has a fever it has not been taking at this time. She reports he has not had a episode of vomiting this AM. But he still having chills and body aches and states he is not feeling good today. I have inform Dr Mike Gip and she wanted me to reach out to [facility] to see what was the protocol for this patient. Ms Carney Corners was informed and will reach out to the patient. I have informed Ms Carmell Austria and she was understanding and agreeable.

## 2019-10-20 NOTE — ED Triage Notes (Signed)
Pt comes via POV from Cancer center with c/o fever. Pt states chills and body aches all over.  Pt has stage 4 lung cancer. Pt states he had last treatment 2 weeks ago the patient states he has been sick for about a week. Pt states all this week he has been achy. Pt states he also just got his first covid shot a week before treatment.  Pt states some vomiting.  Current temp-98.5

## 2019-10-20 NOTE — Telephone Encounter (Signed)
Spoke to patient via telephone and asked him to come into the clinic today for labs, see Lauren and evaluation. He accepted appt. For 1:00.

## 2019-10-20 NOTE — Progress Notes (Signed)
Symptom Management [place]  [phone] [phone]  Patient Care Team: [name], NP as PCP - General (Nurse Practitioner) Telford Nab, RN as Registered Nurse Burlene Arnt, Gerda Diss, MD as Attending Physician (Emergency Medicine) Lebron Conners, [place] (Physician Assistant) Wonda Horner, MD as Referring Physician (Neurosurgery)   Name of the patient: Jimmy West  [id number]  [birth year]   Date of visit: 10/20/19  Diagnosis-metastatic lung cancer  Chief complaint/ Reason for visit- chills, myalgias, vomiting  Heme/Onc history:  Oncology History  Primary cancer of right lower lobe of lung ([place])  04/26/2018 Initial Diagnosis   Primary cancer of right lower lobe of lung ([place])   05/24/2018 - 01/30/2019 Chemotherapy   The patient had pembrolizumab (KEYTRUDA) 200 mg in sodium chloride 0.9 % 50 mL chemo infusion, 200 mg, Intravenous, Once, 11 of 14 cycles Administration: 200 mg (05/24/2018), 200 mg (07/16/2018), 200 mg (08/09/2018), 200 mg (08/30/2018), 200 mg (09/20/2018), 200 mg (06/14/2018), 200 mg (10/11/2018), 200 mg (11/08/2018), 200 mg (11/29/2018), 200 mg (12/20/2018), 200 mg (01/10/2019)  for chemotherapy treatment.    02/08/2019 -  Chemotherapy   The patient had palonosetron (ALOXI) injection 0.25 mg, 0.25 mg, Intravenous,  Once, 12 of 15 cycles Administration: 0.25 mg (02/08/2019), 0.25 mg (03/29/2019), 0.25 mg (04/18/2019), 0.25 mg (02/28/2019), 0.25 mg (05/09/2019), 0.25 mg (05/30/2019), 0.25 mg (06/27/2019), 0.25 mg (07/18/2019), 0.25 mg (08/08/2019), 0.25 mg (08/30/2019), 0.25 mg (09/20/2019), 0.25 mg (10/11/2019) PEMEtrexed (ALIMTA) 900 mg in sodium chloride 0.9 % 100 mL chemo infusion, 500 mg/m2 = 900 mg, Intravenous,  Once, 12 of 15 cycles Administration: 900 mg (02/08/2019), 900 mg (03/29/2019), 900 mg (04/18/2019), 900 mg (05/09/2019), 900 mg (05/30/2019), 900 mg (02/28/2019), 900 mg (06/27/2019), 900 mg (07/18/2019), 900 mg  (08/08/2019), 900 mg (08/30/2019), 900 mg (09/20/2019), 900 mg (10/11/2019) CARBOplatin (PARAPLATIN) 500 mg in sodium chloride 0.9 % 250 mL chemo infusion, 470 mg (100 % of original dose 472 mg), Intravenous,  Once, 2 of 2 cycles Dose modification:   (original dose 472 mg, Cycle 1) Administration: 500 mg (02/08/2019), 190 mg (04/18/2019) pembrolizumab (KEYTRUDA) 200 mg in sodium chloride 0.9 % 50 mL chemo infusion, 200 mg, Intravenous, Once, 12 of 15 cycles Administration: 200 mg (02/08/2019), 200 mg (03/29/2019), 200 mg (04/18/2019), 200 mg (05/09/2019), 200 mg (05/30/2019), 200 mg (02/28/2019), 200 mg (06/27/2019), 200 mg (07/18/2019), 200 mg (08/08/2019), 200 mg (08/30/2019), 200 mg (09/20/2019), 200 mg (10/11/2019) fosaprepitant (EMEND) 150 mg, dexamethasone (DECADRON) 12 mg in sodium chloride 0.9 % 145 mL IVPB, , Intravenous,  Once, 4 of 4 cycles Administration:  (02/08/2019),  (04/18/2019)  for chemotherapy treatment.      Interval history- Jimmy West, 69 year old male currently receiving Alimta and Keytruda for metastatic lung cancer, who presents to symptom management clinic for complaints of myalgias, appetite changes, vomiting and fever.  Symptoms started on Sunday 3/28 and have persisted since that time.  Says he generally feels weak and tired.  Took Tylenol earlier today without improvement in symptoms.  Unsure of fever. 4lb weight loss. No urinary complaints. Denies sick contacts.  Had first dose of his Covid vaccine approximately 2 weeks ago and questioned if symptoms were related to vaccine or chemo. Last chemo on 10/11/2019. He says he's tired of being sick. He drove to clinic today.   ECOG FS:2 - Symptomatic, <50% confined to bed  Review of systems- Review of Systems  Constitutional: Positive for chills, fever, malaise/fatigue and weight loss.  HENT: Negative for hearing loss, nosebleeds,  sore throat and tinnitus.   Eyes: Negative for blurred vision and double vision.  Respiratory: Negative for  cough, hemoptysis, shortness of breath and wheezing.   Cardiovascular: Negative for chest pain, palpitations and leg swelling.  Gastrointestinal: Positive for nausea and vomiting. Negative for abdominal pain, blood in stool, constipation, diarrhea and melena.  Genitourinary: Negative for dysuria and urgency.  Musculoskeletal: Positive for back pain and myalgias. Negative for falls and joint pain.  Skin: Negative.  Negative for itching and rash.  Neurological: Positive for weakness. Negative for dizziness, tingling, sensory change, loss of consciousness and headaches.  Endo/Heme/Allergies: Negative for environmental allergies. Does not bruise/bleed easily.  Psychiatric/Behavioral: Positive for depression. The patient is not nervous/anxious and does not have insomnia.     No Known Allergies  Past Medical History:  Diagnosis Date  . Bulging lumbar disc   . Cancer ([place])    stage 4 lung cancer  . Heart attack [facility])     Past Surgical History:  Procedure Laterality Date  . CARDIAC CATHETERIZATION     two stents  . KNEE SURGERY Left   . PORTA CATH INSERTION N/A 05/21/2018   Procedure: PORTA CATH INSERTION;  Surgeon: Algernon Huxley, MD;  Location: [place] CV LAB;  Service: Cardiovascular;  Laterality: N/A;    Social History   Socioeconomic History  . Marital status: Single    Spouse name: Not on file  . Number of children: Not on file  . Years of education: Not on file  . Highest education level: Not on file  Occupational History  . Not on file  Tobacco Use  . Smoking status: Former Smoker    Packs/day: 1.50    Years: 15.00    Pack years: 22.50    Types: Cigarettes    Quit date: 11/03/2003    Years since quitting: 15.9  . Smokeless tobacco: Never Used  Substance and Sexual Activity  . Alcohol use: Yes    Alcohol/week: 15.0 standard drinks    Types: 15 Shots of liquor per week  . Drug use: No  . Sexual activity: Not Currently  Other Topics Concern  . Not on file    Social History Narrative  . Not on file   Social Determinants of Health   Financial Resource Strain:   . Difficulty of Paying Living Expenses:   Food Insecurity:   . Worried About Charity fundraiser in the Last Year:   . Arboriculturist in the Last Year:   Transportation Needs:   . Film/video editor (Medical):   Marland Kitchen Lack of Transportation (Non-Medical):   Physical Activity:   . Days of Exercise per Week:   . Minutes of Exercise per Session:   Stress:   . Feeling of Stress :   Social Connections:   . Frequency of Communication with Friends and Family:   . Frequency of Social Gatherings with Friends and Family:   . Attends Religious Services:   . Active Member of Clubs or Organizations:   . Attends Archivist Meetings:   Marland Kitchen Marital Status:   Intimate Partner Violence:   . Fear of Current or Ex-Partner:   . Emotionally Abused:   Marland Kitchen Physically Abused:   . Sexually Abused:     Family History  Problem Relation Age of Onset  . Heart failure Father   . Cancer Maternal Aunt   . Heart failure Maternal Uncle   . Dementia Paternal Grandmother   . Cancer Maternal Aunt  Current Outpatient Medications:  .  acetaminophen (TYLENOL) 500 MG tablet, Take 500 mg by mouth 3 (three) times daily as needed., Disp: , Rfl:  .  albuterol (VENTOLIN HFA) 108 (90 Base) MCG/ACT inhaler, Inhale 2 puffs into the lungs every 6 (six) hours as needed for wheezing or shortness of breath., Disp: 18 g, Rfl: 3 .  ALPRAZolam (XANAX) 0.5 MG tablet, Take 1 tablet (0.5 mg total) by mouth 4 (four) times daily as needed for anxiety., Disp: 60 tablet, Rfl: 0 .  apixaban (ELIQUIS) 5 MG TABS tablet, Take 1 tablet (5mg ) twice daily, Disp: 60 tablet, Rfl: 0 .  Calcium 600-400 MG-UNIT CHEW, Chew 2 tablets by mouth daily., Disp: , Rfl:  .  dexamethasone (DECADRON) 4 MG tablet, Take 1 tab two times a day the day before Alimta chemo, then take 2 tabs once a day for 3 days starting the day after chemo.,  Disp: 30 tablet, Rfl: 1 .  FLUoxetine (PROZAC) 40 MG capsule, Take 1 capsule (40 mg total) by mouth daily., Disp: 30 capsule, Rfl: 3 .  folic acid (FOLVITE) 1 MG tablet, Take 1 tablet (1 mg total) by mouth daily. Start 5-7 days before Alimta chemotherapy. Continue until 21 days after Alimta completed., Disp: 100 tablet, Rfl: 3 .  ondansetron (ZOFRAN ODT) 4 MG disintegrating tablet, Take 1 tablet (4 mg total) by mouth every 6 (six) hours as needed for nausea or vomiting., Disp: 30 tablet, Rfl: 1 .  Oxycodone HCl 20 MG TABS, TAKE 1/2 TO 1 TABLET BY MOUTH EVERY 4 HOURS AS NEEDED FOR SEVERE PAIN, Disp: 60 tablet, Rfl: 0 .  polyethylene glycol (MIRALAX / GLYCOLAX) packet, Take 17 g by mouth daily., Disp: , Rfl:  .  senna (SENOKOT) 8.6 MG tablet, Take 1 tablet by mouth as needed. , Disp: , Rfl:  .  ibuprofen (ADVIL,MOTRIN) 200 MG tablet, Take 200 mg by mouth every 6 (six) hours as needed., Disp: , Rfl:  No current facility-administered medications for this visit.  Facility-Administered Medications Ordered in Other Visits:  .  0.9 %  sodium chloride infusion, , Intravenous, Once, Corcoran, Melissa C, MD .  0.9 %  sodium chloride infusion, , Intravenous, Once PRN, Corcoran, Melissa C, MD .  albuterol (PROVENTIL) (2.5 MG/3ML) 0.083% nebulizer solution 2.5 mg, 2.5 mg, Nebulization, Once PRN, Corcoran, Melissa C, MD .  alteplase (CATHFLO ACTIVASE) injection 2 mg, 2 mg, Intracatheter, Once PRN, Corcoran, Melissa C, MD .  EPINEPHrine (ADRENALIN) 1 MG/10ML injection 0.25 mg, 0.25 mg, Intravenous, Once PRN, Corcoran, Melissa C, MD .  EPINEPHrine (ADRENALIN) 1 MG/10ML injection 0.25 mg, 0.25 mg, Intravenous, Once PRN, Corcoran, Melissa C, MD .  heparin lock flush 100 unit/mL, 500 Units, Intracatheter, Once PRN, Corcoran, Melissa C, MD .  heparin lock flush 100 unit/mL, 250 Units, Intracatheter, Once PRN, Corcoran, Melissa C, MD .  sodium chloride flush (NS) 0.9 % injection 10 mL, 10 mL, Intravenous, PRN,  Mike Gip, Melissa C, MD, 10 mL at 03/04/19 0959 .  sodium chloride flush (NS) 0.9 % injection 10 mL, 10 mL, Intracatheter, Once PRN, Corcoran, Melissa C, MD .  sodium chloride flush (NS) 0.9 % injection 3 mL, 3 mL, Intracatheter, Once PRN, Lequita Asal, MD  Physical exam:  Vitals:   10/20/19 1358  BP: 112/76  Pulse: 89  Resp: 18  Temp: 100.3 F (37.9 C)  TempSrc: Oral  SpO2: 98%  Weight: 135 lb 14.4 oz (61.6 kg)   Physical Exam Constitutional:  General: He is not in acute distress.    Appearance: He is well-developed.     Comments: Frail appearing, thin built.   HENT:     Head: Normocephalic and atraumatic.     Nose: Nose normal.     Mouth/Throat:     Pharynx: No oropharyngeal exudate.  Eyes:     General: No scleral icterus.    Conjunctiva/sclera: Conjunctivae normal.  Pulmonary:     Effort: Pulmonary effort is normal. No respiratory distress.     Breath sounds: No wheezing.  Abdominal:     General: There is no distension.     Tenderness: There is no guarding.  Musculoskeletal:     Comments: No aids  Skin:    General: Skin is dry.     Coloration: Skin is not jaundiced.     Findings: No rash.  Neurological:     Mental Status: He is alert and oriented to person, place, and time.  Psychiatric:        Mood and Affect: Mood normal.        Behavior: Behavior normal.      CMP Latest Ref Rng & Units 10/20/2019  Glucose 70 - 99 mg/dL 128(H)  BUN 8 - 23 mg/dL 17  Creatinine 0.61 - 1.24 mg/dL 1.49(H)  Sodium 135 - 145 mmol/L 138  Potassium 3.5 - 5.1 mmol/L 3.6  Chloride 98 - 111 mmol/L 101  CO2 22 - 32 mmol/L 25  Calcium 8.9 - 10.3 mg/dL 8.6(L)  Total Protein 6.5 - 8.1 g/dL 7.3  Total Bilirubin 0.3 - 1.2 mg/dL 1.6(H)  Alkaline Phos 38 - 126 U/L 65  AST 15 - 41 U/L 24  ALT 0 - 44 U/L 16   CBC Latest Ref Rng & Units 10/20/2019  WBC 4.0 - 10.5 K/uL 2.3(L)  Hemoglobin 13.0 - 17.0 g/dL 8.9(L)  Hematocrit 39.0 - 52.0 % 27.9(L)  Platelets 150 - 400 K/uL 88(L)      No images are attached to the encounter.  No results found.  Assessment and plan- Patient is a 69 y.o. male diagnosed with metastatic adenocarcinoma of the lung currently on Alimta chemotherapy and pembrolizumab who presents to symptom management clinic for symptoms concerning for covid infection in setting of febrile neutropenia secondary to chemotherapy.   1. Symptoms of viral illness- question covid infection. He is s/p 1 dose of covid vaccine. Discussed with Dr. Mike Gip who recommends transfer to ER for covid testing and chest x-ray. If positive, in setting of febrile neutropenia, she recommends hospitalization for management of covid and neutropenia.   2. Febrile neutropenia- oral temp 100.3 with tylenol. Wbc 2.3, anc 1.0. Secondary to chemotherapy. Blood cultures pending. UA negative. If stable to be outpatient consider oral antibiotic treatment such as levaquin until patient is afebrile 3-5 days, evidence of bone marrow recovery, or etiology identified/spectrum narrowed. If febrile after 5 days consider adding antifungal.   If covid negative, consider gcsf/granix with goal anc > 5000.   If patient is found to be covid positive however, consider that gcsf is both a growth factor and cytokine that initiates proliferation and differentiation of mature granulocytes.  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  An observational cohort of 300 for hospitalized patients with COVID-19 at [facility] was assembled to investigate links between concurrent neutropenia and DCFS administration on COVID-19 associated respiratory failure and death.  These factors were assessed as time dependent predictors  using an extended Cox model controlling for age and underlying cancer diagnosis.  To determine whether the degree of granulocyte response to G-CSF affected outcomes, similar model was constructed  with patients that received G-CSF categorized into high and low response based on the level of absolute neutrophil count rise 24 hours after growth factor administration.  Neutropenia during COVID-19 course was not independently associated with severe respiratory failure or death in hospitalized Covid patients.  When controlling for neutropenia, G-CSF administration was associated with increased need for high oxygen supplementation and death.  P value 0.038.  This effect was predominantly seen in patients that exhibited a high response to G-CSF based on their [place] increased post G-CSF administration.  P value 0.006.  Possible risks versus benefits of G-CSF administration should be weighed in neutropenic cancer patients with COVID-19 infection is G-CSF may lead to worsening clinical and respiratory status in the setting. (Source: [employer])   3. Elevated serum creatinine- likely secondary to fluid losses and poor PO intake. IV fluids in clinic.  Disposition:  - Discussed with Dr. Mike Gip who recommends transfer to ER for evaluation, chest x-ray, and covid testing with possible hospitalization. Plan discussed in detail with patient who is reluctant to go to ER but agrees to evaluation and plan. Follow up based on ER course.    Visit Diagnosis 1. Febrile neutropenia (HCC)   2. Nonspecific syndrome suggestive of viral illness   3. Elevated serum creatinine     Patient expressed understanding and was in agreement with this plan. He also understands that He can call clinic at any time with any questions, concerns, or complaints.   Thank you for allowing me to participate in the care of this very pleasant patient.   Beckey Rutter, DNP, AGNP-C [place] [facility] [place]  CC: Dr. Mike Gip

## 2019-10-21 ENCOUNTER — Inpatient Hospital Stay: Payer: Medicare Other

## 2019-10-21 ENCOUNTER — Telehealth: Payer: Self-pay | Admitting: *Deleted

## 2019-10-21 LAB — URINE CULTURE: Culture: 20000 — AB

## 2019-10-21 NOTE — Telephone Encounter (Signed)
RN called patient to follow up on his ER visit yesterday to see how he is feeling today. He states he feels better. No fever today. Aching bones and joints are better. Slight cough. He is able to eat 1 meal daily supplements with boost. Instruct drinking plenty of water. They gave him an antibiotic to take. Covid test was negative.He thanked me for checking on him.

## 2019-10-25 LAB — CULTURE, BLOOD (ROUTINE X 2)
Culture: NO GROWTH
Culture: NO GROWTH
Special Requests: ADEQUATE
Special Requests: ADEQUATE

## 2019-10-25 NOTE — Progress Notes (Signed)
Pharmacist Chemotherapy Monitoring - Follow Up Assessment    I verify that I have reviewed each item in the below checklist:  . Regimen for the patient is scheduled for the appropriate day and plan matches scheduled date. Marland Kitchen Appropriate non-routine labs are ordered dependent on drug ordered. . If applicable, additional medications reviewed and ordered per protocol based on lifetime cumulative doses and/or treatment regimen.   Plan for follow-up and/or issues identified: Yes . I-vent associated with next due treatment: Yes . MD and/or nursing notified: No  Malyia Moro K 10/25/2019 2:17 PM

## 2019-10-28 ENCOUNTER — Other Ambulatory Visit: Payer: Self-pay | Admitting: *Deleted

## 2019-10-28 DIAGNOSIS — G893 Neoplasm related pain (acute) (chronic): Secondary | ICD-10-CM

## 2019-10-28 DIAGNOSIS — F419 Anxiety disorder, unspecified: Secondary | ICD-10-CM

## 2019-10-28 MED ORDER — ALPRAZOLAM 0.5 MG PO TABS: 0.5000 mg | ORAL_TABLET | Freq: Four times a day (QID) | ORAL | 0 refills | Status: DC | PRN

## 2019-10-28 MED ORDER — OXYCODONE HCL 20 MG PO TABS: ORAL_TABLET | ORAL | 0 refills | Status: DC

## 2019-10-28 NOTE — Telephone Encounter (Signed)
Patient called cancer center requesting refill of Xanax and Oxycodone 20 mg q 4 hours for pain and anxiety.  As mandated by the [place] STOP Act (Strengthen Opioid Misuse Prevention), the [place] Controlled Substance Reporting System ([place]) was reviewed for this patient. Below is the past 70-months of controlled substance prescriptions as displayed by the registry. I have personally consulted with my supervising physician, Dr. Rogue Bussing, who agrees that continuation of opiate therapy is medically appropriate at this time and agrees to provide continual monitoring, including urine/blood drug screens, as indicated. Refill is appropriate on or after  10/27/2019.   [place]CCSRS reviewed: Reviewed and appropriate.     Faythe Casa, NP 10/28/2019 3:09 PM [phone]

## 2019-10-31 ENCOUNTER — Encounter: Payer: Self-pay | Admitting: Hematology and Oncology

## 2019-10-31 NOTE — Progress Notes (Signed)
[facility]  [address] [place], [place] [address] Phone: [phone]  Fax: [phone]   Clinic Day:  11/01/2019  Referring physician: [name], NP  Chief Complaint: Jimmy West is a 69 y.o. male with metastatic adenocarcinoma of the lung who is seen for assessment prior to cycle #11 Alimta + pembrolizumab.   HPI: The patient was last seen in the medical oncology clinic on 10/11/2019. At that time, he was doing well. Pain was well controlled. He denied any respiratory symptoms. Exam was stable. Hematocrit 30.8, hemoglobin 9.4, platelets 440,000, WBC 9,300. Corrected calcium was 9.08. Albumin 3.2. TSH was 0.643. He remained on folic acid. We discussed plans for Retacrit. Patient received cycle #10 Alimta + pembrolizumab.    He was seen in the symptoms management clinic by Beckey Rutter, NP on 10/20/2019. He reported myalgias, appetite changes, vomiting and fever. He had on and off again diarrhea. Symptoms started on 10/16/2019. He had lost 4 pounds.  He denied any urinary symptoms, being in contact with anyone sick.  His first COVID-19 vaccine was 2 weeks prior.  Patient was referred to the ER for CXR and a COVID-19 test.   Patient was seen in the ER on 10/20/2019 for flu like symptoms.  COVID-19 test was negative.  CXR revealed a new mild patchy left costophrenic angle opacity, which could represent atelectasis, aspiration and/or pneumonia. There was suggestion of new small left pleural effusion. There was stable spiculated right infrahilar nodule. He received IVF. He was placed on Levaquin for an unexplained fever.    During the interim, he has felt "pretty good". He had his last COVID-19 vaccine 1 week ago. He tolerated Levaquin well. His last dose was on 10/25/2019. His flu like symptoms lasted until 10/31/2019. He denies any recent fevers. He has upper back pain (4/10) today. He reports not taking as much pain medication. He remains on  folic acid. He reports taking his steroids yesterday. Potassium is 3.4 today. Patient would like to eat potassium rich foods before taking any potassium supplements.    Past Medical History:  Diagnosis Date  . Bulging lumbar disc   . Cancer ([place])    stage 4 lung cancer  . Heart attack [facility])     Past Surgical History:  Procedure Laterality Date  . CARDIAC CATHETERIZATION     two stents  . KNEE SURGERY Left   . PORTA CATH INSERTION N/A 05/21/2018   Procedure: PORTA CATH INSERTION;  Surgeon: Algernon Huxley, MD;  Location: [facility];  Service: Cardiovascular;  Laterality: N/A;    Family History  Problem Relation Age of Onset  . Heart failure Father   . Cancer Maternal Aunt   . Heart failure Maternal Uncle   . Dementia Paternal Grandmother   . Cancer Maternal Aunt     Social History:  reports that he quit smoking about 16 years ago. His smoking use included cigarettes. He has a 22.50 pack-year smoking history. He has never used smokeless tobacco. He reports current alcohol use of about 15.0 standard drinks of alcohol per week. He reports that he does not use drugs. He started smoking at age 49. He is smoking 1 1/2 packs/day. Patient denies known exposures to radiation ortoxins. 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 He has not been working recently as the salon was closed. He plays golf occasionally.He has 8 cats. The patient is alone today.  Allergies: No Known Allergies  Current Medications: Current Outpatient Medications  Medication Sig Dispense Refill  . acetaminophen (TYLENOL) 500 MG tablet Take 500 mg by mouth 3 (three) times daily as needed.    Marland Kitchen albuterol (VENTOLIN HFA) 108 (90 Base) MCG/ACT inhaler Inhale 2 puffs into the lungs every 6 (six) hours as needed for wheezing or shortness of breath. 18 g 3  . ALPRAZolam (XANAX) 0.5 MG tablet Take 1 tablet (0.5 mg total) by mouth 4 (four) times daily as needed for anxiety. 60 tablet 0  .  apixaban (ELIQUIS) 5 MG TABS tablet Take 1 tablet (43m) twice daily 60 tablet 0  . Calcium 600-400 MG-UNIT CHEW Chew 2 tablets by mouth daily.    .Marland Kitchendexamethasone (DECADRON) 4 MG tablet Take 1 tab two times a day the day before Alimta chemo, then take 2 tabs once a day for 3 days starting the day after chemo. 30 tablet 1  . FLUoxetine (PROZAC) 40 MG capsule Take 1 capsule (40 mg total) by mouth daily. 30 capsule 3  . folic acid (FOLVITE) 1 MG tablet Take 1 tablet (1 mg total) by mouth daily. Start 5-7 days before Alimta chemotherapy. Continue until 21 days after Alimta completed. 100 tablet 3  . ibuprofen (ADVIL,MOTRIN) 200 MG tablet Take 200 mg by mouth every 6 (six) hours as needed.    . ondansetron (ZOFRAN ODT) 4 MG disintegrating tablet Take 1 tablet (4 mg total) by mouth every 6 (six) hours as needed for nausea or vomiting. 30 tablet 1  . Oxycodone HCl 20 MG TABS TAKE 1/2 TO 1 TABLET BY MOUTH EVERY 4 HOURS AS NEEDED FOR SEVERE PAIN 60 tablet 0  . polyethylene glycol (MIRALAX / GLYCOLAX) packet Take 17 g by mouth daily.    .Marland Kitchensenna (SENOKOT) 8.6 MG tablet Take 1 tablet by mouth as needed.     .Marland Kitchenlevofloxacin (LEVAQUIN) 750 MG tablet Take 1 tablet (750 mg total) by mouth daily. (Patient not taking: Reported on 10/31/2019) 5 tablet 0   No current facility-administered medications for this visit.   Facility-Administered Medications Ordered in Other Visits  Medication Dose Route Frequency Provider Last Rate Last Admin  . 0.9 %  sodium chloride infusion   Intravenous Once Kerby Borner C, MD      . 0.9 %  sodium chloride infusion   Intravenous Once PRN Jorene Kaylor C, MD      . albuterol (PROVENTIL) (2.5 MG/3ML) 0.083% nebulizer solution 2.5 mg  2.5 mg Nebulization Once PRN Brooks Stotz C, MD      . alteplase (CATHFLO ACTIVASE) injection 2 mg  2 mg Intracatheter Once PRN CLequita Asal MD      . EPINEPHrine (ADRENALIN) 1 MG/10ML injection 0.25 mg  0.25 mg Intravenous Once PRN  Reatha Sur C, MD      . EPINEPHrine (ADRENALIN) 1 MG/10ML injection 0.25 mg  0.25 mg Intravenous Once PRN Dolorez Jeffrey C, MD      . heparin lock flush 100 unit/mL  500 Units Intracatheter Once PRN CNolon StallsC, MD      . heparin lock flush 100 unit/mL  250 Units Intracatheter Once PRN CNolon StallsC, MD      . heparin lock flush 100 unit/mL  500 Units Intravenous Once Marquetta Weiskopf C, MD      . sodium chloride flush (NS) 0.9 % injection 10 mL  10 mL Intravenous PRN CNolon StallsC, MD   10 mL at 03/04/19 0959  . sodium chloride flush (NS) 0.9 %  injection 10 mL  10 mL Intracatheter Once PRN Nolon Stalls C, MD      . sodium chloride flush (NS) 0.9 % injection 10 mL  10 mL Intravenous PRN Lequita Asal, MD   10 mL at 11/01/19 0915  . sodium chloride flush (NS) 0.9 % injection 3 mL  3 mL Intracatheter Once PRN Lequita Asal, MD        Review of Systems  Constitutional: Negative for chills, diaphoresis, fever, malaise/fatigue and weight loss (up 4 lbs).       Feeling "pretty good".  HENT: Negative for congestion, ear discharge, ear pain, hearing loss, nosebleeds, sinus pain, sore throat and tinnitus.   Eyes: Negative.  Negative for blurred vision, double vision and photophobia.  Respiratory: Negative for cough (chronic, better), hemoptysis, sputum production and shortness of breath.   Cardiovascular: Negative.  Negative for chest pain, palpitations and leg swelling.  Gastrointestinal: Negative.  Negative for abdominal pain, blood in stool, constipation, diarrhea, heartburn, melena, nausea and vomiting.  Genitourinary: Negative.  Negative for dysuria, hematuria and urgency.  Musculoskeletal: Positive for back pain (comes and goes (4/10)), joint pain (shoulders) and neck pain (3-4/10). Negative for myalgias.  Skin: Negative for itching and rash.  Neurological: Negative.  Negative for dizziness, tingling, sensory change, weakness and headaches.    Endo/Heme/Allergies: Negative.  Does not bruise/bleed easily.  Psychiatric/Behavioral: Negative.  Negative for depression and memory loss. The patient is not nervous/anxious (chronic of Xanax) and does not have insomnia.   All other systems reviewed and are negative.  Performance status (ECOG): 1  Vitals Blood pressure 120/72, pulse 61, temperature 97.6 F (36.4 C), temperature source Tympanic, resp. rate 17, weight 139 lb (63 kg), SpO2 99 %.   Physical Exam  Constitutional: He is oriented to person, place, and time. He appears well-developed and well-nourished. No distress.  HENT:  Head: Normocephalic and atraumatic.  Mouth/Throat: Oropharynx is clear and moist. No oropharyngeal exudate.  Thin brown graying hair.  Mustache.  Dentures.  Mask.  Eyes: Pupils are equal, round, and reactive to light. Conjunctivae and EOM are normal. No scleral icterus.  Glasses. Gray blue eyes.  Neck: No JVD present.  Cardiovascular: Normal rate, regular rhythm and normal heart sounds.  No murmur heard. Pulmonary/Chest: Effort normal and breath sounds normal. No respiratory distress. He has no wheezes. He has no rales. He exhibits no tenderness.  Abdominal: Soft. Bowel sounds are normal. He exhibits no distension and no mass. There is no abdominal tenderness. There is no rebound and no guarding.  Musculoskeletal:        General: Tenderness (upper and lower back) present. No edema. Normal range of motion.     Cervical back: Normal range of motion and neck supple.  Lymphadenopathy:       Head (right side): No preauricular, no posterior auricular and no occipital adenopathy present.       Head (left side): No preauricular, no posterior auricular and no occipital adenopathy present.    He has no cervical adenopathy.    He has no axillary adenopathy.       Right: No supraclavicular adenopathy present.       Left: No supraclavicular adenopathy present.  Neurological: He is alert and oriented to person,  place, and time.  Skin: Skin is warm and dry. He is not diaphoretic.  Psychiatric: He has a normal mood and affect. His behavior is normal. Judgment and thought content normal.  Nursing note and vitals reviewed.   Infusion  on 11/01/2019  Component Date Value Ref Range Status  . WBC 11/01/2019 6.4  4.0 - 10.5 K/uL Final  . RBC 11/01/2019 2.80* 4.22 - 5.81 MIL/uL Final  . Hemoglobin 11/01/2019 8.1* 13.0 - 17.0 g/dL Final  . HCT 11/01/2019 26.2* 39.0 - 52.0 % Final  . MCV 11/01/2019 93.6  80.0 - 100.0 fL Final  . MCH 11/01/2019 28.9  26.0 - 34.0 pg Final  . MCHC 11/01/2019 30.9  30.0 - 36.0 g/dL Final  . RDW 11/01/2019 17.3* 11.5 - 15.5 % Final  . Platelets 11/01/2019 325  150 - 400 K/uL Final  . nRBC 11/01/2019 0.0  0.0 - 0.2 % Final  . Neutrophils Relative % 11/01/2019 64  % Final  . Neutro Abs 11/01/2019 4.1  1.7 - 7.7 K/uL Final  . Lymphocytes Relative 11/01/2019 16  % Final  . Lymphs Abs 11/01/2019 1.0  0.7 - 4.0 K/uL Final  . Monocytes Relative 11/01/2019 15  % Final  . Monocytes Absolute 11/01/2019 1.0  0.1 - 1.0 K/uL Final  . Eosinophils Relative 11/01/2019 3  % Final  . Eosinophils Absolute 11/01/2019 0.2  0.0 - 0.5 K/uL Final  . Basophils Relative 11/01/2019 1  % Final  . Basophils Absolute 11/01/2019 0.0  0.0 - 0.1 K/uL Final  . Immature Granulocytes 11/01/2019 1  % Final  . Abs Immature Granulocytes 11/01/2019 0.07  0.00 - 0.07 K/uL Final   Performed at [facility], [address], [place], [place] [address]    Assessment:  Reino Lybbert is a 68 y.o. male with metastatichigh-grade adenocarcinomaof the right lungs/p CT-guided biopsy of a RLL lung mass on 05/11/2018. Pathologyrevealed an invasive high-grade adenocarcinoma, with predominantly solid growth pattern. The neoplastic cells were TTF-1 (+), Napsin A (+), and P40 (+). He has a T4 vertebral metastasis. Clinical stage is T4N1M1.  There was not enough material for [facility] One testing.  PDL-1revealed TPS 90%.  PET scanon 04/30/2018 revealed a 3.4 cm hypermetabolic RLL pulmonary mass (SUV 13.2), 11 mm pulmonary nodule in the LEFT lung (SUV 4.5), RIGHT paratracheal lymph node (SUV 5.1), and hypermetabolic activity within the T4 vertebral body (SUV 10.2).   Thoracic spine MRIon 05/03/2018 revealed T4 metastasiswith a 40% pathologic compression deformity and 5 mm of retropulsion of the vertebral body. Retropulsion results in mild spinal canal stenosis and mild bilateral C4-5 foraminal stenosis. There was paravertebral soft tissue thickening from mid T3 to mid T5, likely representing edema related to the pathologic compression deformity vs. possible extraosseous extension of the neoplasm. There were no additional thoracic spinal metastases noted.   Head MRIon 05/03/2018 revealed no intracranial metastatic disease. 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 Incidental mention made of mild paranasal sinus disease.  Hereceived 11cycles ofpembrolizumab(05/24/2018 - 02/07/2019). He toleratedtreatment well. CEAwas 1.3 on 08/30/2018.LDHwas 165 on 11/29/2018.  He completed T4 radiationon 07/07/2018. He receives Xgevamonthly (06/03/2018 -07/18/2019).  He received 1 cycle of carboplatin, Alimta, and pembrolizumabon 02/08/2019. Cycle #1 was complicated by nausea, vomiting, and diarrhea necessitating hospitalization.Decision made to hold carboplatin with his second dose.He iss/p2cycles ofAlimta and pembrolizumab (02/28/2019 - 03/29/2019).Cycle #2 was complicated by nausea, vomiting, and dehydration requring fluids in clinic and an overnight stay in the hospital. Hereceivedcarboplatin (AUC 2), Alimta, and pebrolizumab(04/18/2019).He tolerated it poorly.   Hehasreceived10 cycles  ofAlimta and pembrolizumabon 05/09/2019(last03/23/2021). He tolerated well. He receives  B12every 9 weeks (last03/07/2019).  He hascancer-related painin T4. He is currently taking Fentanyl75 mcg/hr and oxycodone10-20 mg every 4 hours prn.  Cervical and thoracic spine CT at [facility]/10/2019 revealed unchanged severe compression deformity/vertebral plana of T4 with a stable degree of retropulsion and focal mild spinal canal stenosis. There was interval enlargement of a right lower lobe pulmonary mass(4.5 x 3.5 cm compared to 3.9 x 2.9 cm)with redemonstrated spiculated pulmonary nodules.  Chest, abdomen, pelvisCTon 01/28/2019 revealed slight interval enlargement of a right lower lobe mass with central necrosis measuring 4.6 x 3.4 cm, previously 4.0 x 3.0 cm. There was no change in right upper lobe nodules(1.4 and 0.8 cm).Therewas almost no residua of left lung nodules, with irregular opacities in the apical left upper lobeandsuperior segment left lower lobe. There was no change in right hilar soft tissue and lymph nodes.There wasvertebra plana deformity of T4andno evidence of new osseous metastatic disease. There was no evidence of distant metastatic disease in the abdomen or pelvis.The1.6 x 1.1 cmleft adrenal nodule(non-metabolic on prior PET scan),was unchanged.  Abdomen and pelvis CTon 04/23/2019 revealed no acute abdominal or pelvic pathology. There was fluid in the colon which can be seen with diarrhea. There was diverticulosis without evidence of diverticulitis. There was a stable 1.5 cm left adrenal nodule.  Chest, abdomen, and pelvisCTon 10/16/2020revealed a positive response to interval therapy for the known right lower lobe lung carcinoma(4.6 x 3.4 cm to3.1 x 2.8 cm transversely). There hadbeen a mild interval decrease contiguous soft tissue that extends along the right hilum. The two right upper lobe noduleswerestable from the most recent prior  study (smaller than 04/27/2018).There are no new lung nodules.Therewasno evidence of new metastatic disease.There was stable severe compression deformity/vertebra plana of T4.Therewasno evidence of other osseous metastatic disease.There was a stable left adrenal nodule consistent with an adenoma.  ChestCT angiogramon 05/30/2019 revealed a tiny subsegmental pulmonary embolusin the right lower lobe. There areas of ground-glass at the periphery of the right chest hadbecome more conspicuous. This mayrelate to mild pneumonitis, perhaps due to radiation, attention on follow-up.The dominant nodule in the right lung basewasstable in size. Heis onEliquis.  Chest, abdomen and pelvis CTon 09/16/2019 revealed asimilar-appearing spiculated right lower lobe nodule.There was no evidence for metastatic disease in the abdomen or pelvis.There was stable left adrenal adenoma.  He has cervicalgia.Cervical spine MRIon 08/14/2019 showed no evidence of metastatic disease within the cervical spine. Heis followedby Dr Izora Ribas.  Hehad transient renal insufficiencyon 03/23/2019. Creatinine was 1.32 (baseline 0.61-1.0). Renal ultrasoundon 03/24/2019 revealed no acute abnormality identified.There was no hydronephrosis or bladder distention.Creatinine is 0.82 today.  Stool waspositive for C difficile + diarrheaon 06/14/2019.Hebeganoral vancomycinbeginning11/27/2020.  He received the influenza vaccineon 05/16/2019.  He had his last COVID-19 vaccine 1 week ago.  Symptomatically, he feels pretty good.  He notes off and on diarrhea.  He completed Levaquin on 10/25/2019 for patchy changes on CXR.  Flulike symptoms resolved yesterday.  Exam is unremarkable.  Hemoglobin is 8.1.  Plan: 1.   Labs today: CBC with diff, CMP, TSH, free T4.  2. Metastatic high-grade adenocarcinoma the RIGHT lung He is s/p 11 cycles of pembrolizumab. He is s/p 1 cycle of carboplatin, Almita, and  pembrolizumab (02/08/2019). He is s/p 1 cycle of carboplatin (AUC 2), Alimta, and pembrolizumab (04/18/2019). He is s/p10cycles of Alimta and pembrolizumab (02/28/2019 - 03/29/2019; 05/09/2019-10/11/2019). Hecontinues treatment without carboplatin. Restaging CT scans on 09/16/2019 revealed stable disease. Discuss continued maintenance treatment.. Postpone treatment today secondary to anemia and recent resolution of  respiratory and flulike symptoms. Discuss symptom management.  He has antiemetics and pain medications at home to use on a prn bases.  Interventions are adequate.   .    3. Bone metastasis Symptomatically, he appears to be doing well. Calcium 8.8 (corrected 9.48).  Xgeva today. 4. Cancer related pain Pain remains well controlled. He has intermittent mid back pain. Pain medications written by [name], NP (palliative care medicine). 5. Cervical spine pain Etiology unrelated to malignancy. Cervical spine MRI on 01/22/2020revealed no metastasis. Patient has been seen in the past by Dr. Izora Ribas. 6.Anxiety Continue low-dose Xanax. 7.Pulmonary embolism Patient on long-term Eliquis.  Ensure platelets > 50,000 8.Chemotherapy induced anemia Hematocrit26.2. Hemoglobin8.1. MCV 93.6today. Ferritin773with an iron saturation of 14% and a TIBC of349 on 09/20/2019. Begin Retacrit.             Potential side effects of Retacrit re-reviewed.  Patient consented to treatment. 9.   RTC in 1 week for MD assessment, labs (CBC with diff, CMP, Mg), and Alimta + pembrolizumab.  I discussed the assessment and treatment plan with the patient.  The patient was provided an opportunity to ask questions and all were answered.  The patient agreed with the  plan and demonstrated an understanding of the instructions.  The patient was advised to call back if the symptoms worsen or if the condition fails to improve as anticipated.  I provided 18 minutes of face-to-face time during this this encounter and > 50% was spent counseling as documented under my assessment and plan.  An additional 5-6 minutes were spent reviewing his chart (Epic and Care Everywhere) including notes, labs, and imaging studies.    Lequita Asal, MD, PhD    11/01/2019, 9:36 AM  I, Selena Batten, am acting as scribe for [employer]. Mike Gip, MD, PhD.  I, Rylie Limburg C. Mike Gip, MD, have reviewed the above documentation for accuracy and completeness, and I agree with the above.

## 2019-10-31 NOTE — Progress Notes (Signed)
No new changes noted today. The patient c/o upper back pain ( pain level today 4) but the patient states this nothing new. The patient Name and DOB has been verified by phone today.

## 2019-11-01 ENCOUNTER — Encounter: Payer: Self-pay | Admitting: Hematology and Oncology

## 2019-11-01 ENCOUNTER — Inpatient Hospital Stay: Payer: Medicare Other | Attending: Hematology and Oncology | Admitting: Hematology and Oncology

## 2019-11-01 ENCOUNTER — Inpatient Hospital Stay (HOSPITAL_BASED_OUTPATIENT_CLINIC_OR_DEPARTMENT_OTHER): Payer: Medicare Other | Admitting: Hospice and Palliative Medicine

## 2019-11-01 ENCOUNTER — Inpatient Hospital Stay: Payer: Medicare Other

## 2019-11-01 ENCOUNTER — Other Ambulatory Visit: Payer: Self-pay

## 2019-11-01 VITALS — BP 120/72 | HR 61 | Temp 97.6°F | Resp 17 | Wt 139.0 lb

## 2019-11-01 DIAGNOSIS — C7951 Secondary malignant neoplasm of bone: Secondary | ICD-10-CM

## 2019-11-01 DIAGNOSIS — G893 Neoplasm related pain (acute) (chronic): Secondary | ICD-10-CM

## 2019-11-01 DIAGNOSIS — Z5111 Encounter for antineoplastic chemotherapy: Secondary | ICD-10-CM | POA: Diagnosis not present

## 2019-11-01 DIAGNOSIS — I2782 Chronic pulmonary embolism: Secondary | ICD-10-CM | POA: Diagnosis not present

## 2019-11-01 DIAGNOSIS — Z5112 Encounter for antineoplastic immunotherapy: Secondary | ICD-10-CM

## 2019-11-01 DIAGNOSIS — D631 Anemia in chronic kidney disease: Secondary | ICD-10-CM | POA: Insufficient documentation

## 2019-11-01 DIAGNOSIS — Z79899 Other long term (current) drug therapy: Secondary | ICD-10-CM | POA: Insufficient documentation

## 2019-11-01 DIAGNOSIS — F419 Anxiety disorder, unspecified: Secondary | ICD-10-CM | POA: Diagnosis not present

## 2019-11-01 DIAGNOSIS — C3431 Malignant neoplasm of lower lobe, right bronchus or lung: Secondary | ICD-10-CM | POA: Insufficient documentation

## 2019-11-01 DIAGNOSIS — T451X5A Adverse effect of antineoplastic and immunosuppressive drugs, initial encounter: Secondary | ICD-10-CM | POA: Diagnosis not present

## 2019-11-01 DIAGNOSIS — Z515 Encounter for palliative care: Secondary | ICD-10-CM

## 2019-11-01 DIAGNOSIS — D6481 Anemia due to antineoplastic chemotherapy: Secondary | ICD-10-CM | POA: Diagnosis not present

## 2019-11-01 LAB — CBC WITH DIFFERENTIAL/PLATELET
Abs Immature Granulocytes: 0.07 10*3/uL (ref 0.00–0.07)
Basophils Absolute: 0 10*3/uL (ref 0.0–0.1)
Basophils Relative: 1 %
Eosinophils Absolute: 0.2 10*3/uL (ref 0.0–0.5)
Eosinophils Relative: 3 %
HCT: 26.2 % — ABNORMAL LOW (ref 39.0–52.0)
Hemoglobin: 8.1 g/dL — ABNORMAL LOW (ref 13.0–17.0)
Immature Granulocytes: 1 %
Lymphocytes Relative: 16 %
Lymphs Abs: 1 10*3/uL (ref 0.7–4.0)
MCH: 28.9 pg (ref 26.0–34.0)
MCHC: 30.9 g/dL (ref 30.0–36.0)
MCV: 93.6 fL (ref 80.0–100.0)
Monocytes Absolute: 1 10*3/uL (ref 0.1–1.0)
Monocytes Relative: 15 %
Neutro Abs: 4.1 10*3/uL (ref 1.7–7.7)
Neutrophils Relative %: 64 %
Platelets: 325 10*3/uL (ref 150–400)
RBC: 2.8 MIL/uL — ABNORMAL LOW (ref 4.22–5.81)
RDW: 17.3 % — ABNORMAL HIGH (ref 11.5–15.5)
WBC: 6.4 10*3/uL (ref 4.0–10.5)
nRBC: 0 % (ref 0.0–0.2)

## 2019-11-01 LAB — COMPREHENSIVE METABOLIC PANEL
ALT: 9 U/L (ref 0–44)
AST: 18 U/L (ref 15–41)
Albumin: 3.2 g/dL — ABNORMAL LOW (ref 3.5–5.0)
Alkaline Phosphatase: 81 U/L (ref 38–126)
Anion gap: 10 (ref 5–15)
BUN: 15 mg/dL (ref 8–23)
CO2: 26 mmol/L (ref 22–32)
Calcium: 8.8 mg/dL — ABNORMAL LOW (ref 8.9–10.3)
Chloride: 103 mmol/L (ref 98–111)
Creatinine, Ser: 1.18 mg/dL (ref 0.61–1.24)
GFR calc Af Amer: >60 mL/min (ref >60)
GFR calc non Af Amer: >60 mL/min (ref >60)
Glucose, Bld: 94 mg/dL (ref 70–99)
Potassium: 3.4 mmol/L — ABNORMAL LOW (ref 3.5–5.1)
Sodium: 139 mmol/L (ref 135–145)
Total Bilirubin: 0.3 mg/dL (ref 0.3–1.2)
Total Protein: 6.4 g/dL — ABNORMAL LOW (ref 6.5–8.1)

## 2019-11-01 LAB — T4, FREE: Free T4: 1.08 ng/dL (ref 0.61–1.12)

## 2019-11-01 LAB — TSH: TSH: 0.933 u[IU]/mL (ref 0.350–4.500)

## 2019-11-01 MED ORDER — EPOETIN ALFA-EPBX 10000 UNIT/ML IJ SOLN
10000.0000 [IU] | Freq: Once | INTRAMUSCULAR | Status: AC
  Administered 2019-11-01: 10000 [IU] via SUBCUTANEOUS

## 2019-11-01 MED ORDER — HEPARIN SOD (PORK) LOCK FLUSH 100 UNIT/ML IV SOLN
500.0000 [IU] | Freq: Once | INTRAVENOUS | Status: AC
  Administered 2019-11-01: 500 [IU] via INTRAVENOUS
  Filled 2019-11-01: qty 5

## 2019-11-01 MED ORDER — SODIUM CHLORIDE 0.9% FLUSH
10.0000 mL | INTRAVENOUS | Status: DC | PRN
  Administered 2019-11-01: 10 mL via INTRAVENOUS
  Filled 2019-11-01: qty 10

## 2019-11-01 MED ORDER — DENOSUMAB 120 MG/1.7ML ~~LOC~~ SOLN
120.0000 mg | Freq: Once | SUBCUTANEOUS | Status: AC
  Administered 2019-11-01: 10:00:00 120 mg via SUBCUTANEOUS

## 2019-11-01 NOTE — Patient Instructions (Signed)
Denosumab injection What is this medicine? DENOSUMAB (den oh sue mab) slows bone breakdown. Prolia is used to treat osteoporosis in women after menopause and in men, and in people who are taking corticosteroids for 6 months or more. Xgeva is used to treat a high calcium level due to cancer and to prevent bone fractures and other bone problems caused by multiple myeloma or cancer bone metastases. Xgeva is also used to treat giant cell tumor of the bone. This medicine may be used for other purposes; ask your health care provider or pharmacist if you have questions. COMMON BRAND NAME(S): Prolia, XGEVA What should I tell my health care provider before I take this medicine? They need to know if you have any of these conditions:  dental disease  having surgery or tooth extraction  infection  kidney disease  low levels of calcium or Vitamin D in the blood  malnutrition  on hemodialysis  skin conditions or sensitivity  thyroid or parathyroid disease  an unusual reaction to denosumab, other medicines, foods, dyes, or preservatives  pregnant or trying to get pregnant  breast-feeding How should I use this medicine? This medicine is for injection under the skin. It is given by a health care professional in a hospital or clinic setting. A special MedGuide will be given to you before each treatment. Be sure to read this information carefully each time. For Prolia, talk to your pediatrician regarding the use of this medicine in children. Special care may be needed. For Xgeva, talk to your pediatrician regarding the use of this medicine in children. While this drug may be prescribed for children as young as 13 years for selected conditions, precautions do apply. Overdosage: If you think you have taken too much of this medicine contact a poison control center or emergency room at once. NOTE: This medicine is only for you. Do not share this medicine with others. What if I miss a dose? It is  important not to miss your dose. Call your doctor or health care professional if you are unable to keep an appointment. What may interact with this medicine? Do not take this medicine with any of the following medications:  other medicines containing denosumab This medicine may also interact with the following medications:  medicines that lower your chance of fighting infection  steroid medicines like prednisone or cortisone This list may not describe all possible interactions. Give your health care provider a list of all the medicines, herbs, non-prescription drugs, or dietary supplements you use. Also tell them if you smoke, drink alcohol, or use illegal drugs. Some items may interact with your medicine. What should I watch for while using this medicine? Visit your doctor or health care professional for regular checks on your progress. Your doctor or health care professional may order blood tests and other tests to see how you are doing. Call your doctor or health care professional for advice if you get a fever, chills or sore throat, or other symptoms of a cold or flu. Do not treat yourself. This drug may decrease your body's ability to fight infection. Try to avoid being around people who are sick. You should make sure you get enough calcium and vitamin D while you are taking this medicine, unless your doctor tells you not to. Discuss the foods you eat and the vitamins you take with your health care professional. See your dentist regularly. Brush and floss your teeth as directed. Before you have any dental work done, tell your dentist you are   receiving this medicine. Do not become pregnant while taking this medicine or for 5 months after stopping it. Talk with your doctor or health care professional about your birth control options while taking this medicine. Women should inform their doctor if they wish to become pregnant or think they might be pregnant. There is a potential for serious side  effects to an unborn child. Talk to your health care professional or pharmacist for more information. What side effects may I notice from receiving this medicine? Side effects that you should report to your doctor or health care professional as soon as possible:  allergic reactions like skin rash, itching or hives, swelling of the face, lips, or tongue  bone pain  breathing problems  dizziness  jaw pain, especially after dental work  redness, blistering, peeling of the skin  signs and symptoms of infection like fever or chills; cough; sore throat; pain or trouble passing urine  signs of low calcium like fast heartbeat, muscle cramps or muscle pain; pain, tingling, numbness in the hands or feet; seizures  unusual bleeding or bruising  unusually weak or tired Side effects that usually do not require medical attention (report to your doctor or health care professional if they continue or are bothersome):  constipation  diarrhea  headache  joint pain  loss of appetite  muscle pain  runny nose  tiredness  upset stomach This list may not describe all possible side effects. Call your doctor for medical advice about side effects. You may report side effects to FDA at 1-800-FDA-1088. Where should I keep my medicine? This medicine is only given in a clinic, doctor's office, or other health care setting and will not be stored at home. NOTE: This sheet is a summary. It may not cover all possible information. If you have questions about this medicine, talk to your doctor, pharmacist, or health care provider.  2020 [employer] Standard (2017-11-13 16:10:44) Epoetin Alfa injection What is this medicine? EPOETIN ALFA (e POE e tin AL fa) helps your body make more red blood cells. This medicine is used to treat anemia caused by chronic kidney disease, cancer chemotherapy, or HIV-therapy. It may also be used before surgery if you have anemia. This medicine may be used for other  purposes; ask your health care provider or pharmacist if you have questions. COMMON BRAND NAME(S): Epogen, Procrit, Retacrit What should I tell my health care provider before I take this medicine? They need to know if you have any of these conditions:  cancer  heart disease  high blood pressure  history of blood clots  history of stroke  low levels of folate, iron, or vitamin B12 in the blood  seizures  an unusual or allergic reaction to erythropoietin, albumin, benzyl alcohol, hamster proteins, other medicines, foods, dyes, or preservatives  pregnant or trying to get pregnant  breast-feeding How should I use this medicine? This medicine is for injection into a vein or under the skin. It is usually given by a health care professional in a hospital or clinic setting. If you get this medicine at home, you will be taught how to prepare and give this medicine. Use exactly as directed. Take your medicine at regular intervals. Do not take your medicine more often than directed. It is important that you put your used needles and syringes in a special sharps container. Do not put them in a trash can. If you do not have a sharps container, call your pharmacist or healthcare provider to get one. A   special MedGuide will be given to you by the pharmacist with each prescription and refill. Be sure to read this information carefully each time. Talk to your pediatrician regarding the use of this medicine in children. While this drug may be prescribed for selected conditions, precautions do apply. Overdosage: If you think you have taken too much of this medicine contact a poison control center or emergency room at once. NOTE: This medicine is only for you. Do not share this medicine with others. What if I miss a dose? If you miss a dose, take it as soon as you can. If it is almost time for your next dose, take only that dose. Do not take double or extra doses. What may interact with this  medicine? Interactions have not been studied. This list may not describe all possible interactions. Give your health care provider a list of all the medicines, herbs, non-prescription drugs, or dietary supplements you use. Also tell them if you smoke, drink alcohol, or use illegal drugs. Some items may interact with your medicine. What should I watch for while using this medicine? Your condition will be monitored carefully while you are receiving this medicine. You may need blood work done while you are taking this medicine. This medicine may cause a decrease in vitamin B6. You should make sure that you get enough vitamin B6 while you are taking this medicine. Discuss the foods you eat and the vitamins you take with your health care professional. What side effects may I notice from receiving this medicine? Side effects that you should report to your doctor or health care professional as soon as possible:  allergic reactions like skin rash, itching or hives, swelling of the face, lips, or tongue  seizures  signs and symptoms of a blood clot such as breathing problems; changes in vision; chest pain; severe, sudden headache; pain, swelling, warmth in the leg; trouble speaking; sudden numbness or weakness of the face, arm or leg  signs and symptoms of a stroke like changes in vision; confusion; trouble speaking or understanding; severe headaches; sudden numbness or weakness of the face, arm or leg; trouble walking; dizziness; loss of balance or coordination Side effects that usually do not require medical attention (report to your doctor or health care professional if they continue or are bothersome):  chills  cough  dizziness  fever  headaches  joint pain  muscle cramps  muscle pain  nausea, vomiting  pain, redness, or irritation at site where injected This list may not describe all possible side effects. Call your doctor for medical advice about side effects. You may report side  effects to FDA at 1-800-FDA-1088. Where should I keep my medicine? Keep out of the reach of children. Store in a refrigerator between 2 and 8 degrees C (36 and 46 degrees F). Do not freeze or shake. Throw away any unused portion if using a single-dose vial. Multi-dose vials can be kept in the refrigerator for up to 21 days after the initial dose. Throw away unused medicine. NOTE: This sheet is a summary. It may not cover all possible information. If you have questions about this medicine, talk to your doctor, pharmacist, or health care provider.  2020 [employer] Standard (2017-02-13 08:35:19)  

## 2019-11-02 NOTE — Progress Notes (Signed)
I was unable to reach patient by phone.  Voicemail left.  Will reschedule.

## 2019-11-07 ENCOUNTER — Other Ambulatory Visit: Payer: Self-pay

## 2019-11-07 ENCOUNTER — Encounter: Payer: Self-pay | Admitting: Hematology and Oncology

## 2019-11-07 NOTE — Progress Notes (Signed)
[facility]  [address] [place], [place] [address] Phone: [phone]  Fax: [phone]   Clinic Day:  11/08/2019  Referring physician: [name], NP  Chief Complaint: Jimmy West is a 69 y.o. male with metastatic adenocarcinoma of the lung who is seen for assessment prior to cycle #11 Alimta and pembrolizumab.  HPI: The patient was last seen in the medical oncology clinic on 11/01/2019. At that time, he felt "pretty good".  He had completed a course of Levaquin on 10/25/2019 for a left sided chest infiltrate.  He had received his second COVID-19 vaccine 1 week prior.  Flu like symptoms lasted until 10/31/2019. Hematocrit was 26.2, hemoglobin 8.1, platelets 325,000, WBC 6,400. Potassium was 3.4. Calcium 8.8 (corrected 9.48).  Decision was made to postpone treatment secondary to anemia.  He received Xgeva and Retacrit.   [name], NP tried to reach out to the patient on 11/01/2019 but was unsuccessful.   During the interim, he felt "alright". He tolerated Xgeva and Retacrit well. His finished his antibiotics. His breathing is well. His back pain is stable. He is taking 3 or 4 pills for pain daily. He reports being off the Fentanyl patch. His neck pain is better. He has no shoulder pain.   The patient notes he is not active. He does not know what to do with his spare time.    Past Medical History:  Diagnosis Date  . Bulging lumbar disc   . Cancer ([place])    stage 4 lung cancer  . Heart attack [facility])     Past Surgical History:  Procedure Laterality Date  . CARDIAC CATHETERIZATION     two stents  . KNEE SURGERY Left   . PORTA CATH INSERTION N/A 05/21/2018   Procedure: PORTA CATH INSERTION;  Surgeon: Algernon Huxley, MD;  Location: [facility];  Service: Cardiovascular;  Laterality: N/A;    Family History  Problem Relation Age of Onset  . Heart failure Father   . Cancer Maternal Aunt   . Heart failure Maternal Uncle    . Dementia Paternal Grandmother   . Cancer Maternal Aunt     Social History:  reports that he quit smoking about 16 years ago. His smoking use included cigarettes. He has a 22.50 pack-year smoking history. He has never used smokeless tobacco. He reports current alcohol use of about 15.0 standard drinks of alcohol per week. He reports that he does not use drugs. He started smoking at age 42. He is smoking 1 1/2 packs/day. Patient denies known exposures to radiation ortoxins. 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 He has not been working recently as the salon was closed. He plays golf occasionally.He has 8 cats. The patient is alone today.  Allergies: No Known Allergies  Current Medications: Current Outpatient Medications  Medication Sig Dispense Refill  . acetaminophen (TYLENOL) 500 MG tablet Take 500 mg by mouth 3 (three) times daily as needed.    Marland Kitchen albuterol (VENTOLIN HFA) 108 (90 Base) MCG/ACT inhaler Inhale 2 puffs into the lungs every 6 (six) hours as needed for wheezing or shortness of breath. 18 g 3  . ALPRAZolam (XANAX) 0.5 MG tablet Take 1 tablet (0.5 mg total) by mouth 4 (four) times daily as needed for anxiety. 60 tablet 0  . apixaban (ELIQUIS) 5 MG TABS tablet Take 1 tablet (76m) twice daily 60 tablet 0  . Calcium 600-400 MG-UNIT CHEW Chew 2 tablets by mouth  daily.    . dexamethasone (DECADRON) 4 MG tablet Take 1 tab two times a day the day before Alimta chemo, then take 2 tabs once a day for 3 days starting the day after chemo. 30 tablet 1  . FLUoxetine (PROZAC) 40 MG capsule Take 1 capsule (40 mg total) by mouth daily. 30 capsule 3  . folic acid (FOLVITE) 1 MG tablet Take 1 tablet (1 mg total) by mouth daily. Start 5-7 days before Alimta chemotherapy. Continue until 21 days after Alimta completed. 100 tablet 3  . Oxycodone HCl 20 MG TABS TAKE 1/2 TO 1 TABLET BY MOUTH EVERY 4 HOURS AS NEEDED FOR SEVERE PAIN 60 tablet 0  . polyethylene glycol (MIRALAX  / GLYCOLAX) packet Take 17 g by mouth daily.    Marland Kitchen senna (SENOKOT) 8.6 MG tablet Take 1 tablet by mouth as needed.     Marland Kitchen ibuprofen (ADVIL,MOTRIN) 200 MG tablet Take 200 mg by mouth every 6 (six) hours as needed.    Marland Kitchen levofloxacin (LEVAQUIN) 750 MG tablet Take 1 tablet (750 mg total) by mouth daily. (Patient not taking: Reported on 10/31/2019) 5 tablet 0  . ondansetron (ZOFRAN ODT) 4 MG disintegrating tablet Take 1 tablet (4 mg total) by mouth every 6 (six) hours as needed for nausea or vomiting. (Patient not taking: Reported on 11/07/2019) 30 tablet 1   No current facility-administered medications for this visit.   Facility-Administered Medications Ordered in Other Visits  Medication Dose Route Frequency Provider Last Rate Last Admin  . 0.9 %  sodium chloride infusion   Intravenous Once ,  C, MD      . 0.9 %  sodium chloride infusion   Intravenous Once PRN ,  C, MD      . albuterol (PROVENTIL) (2.5 MG/3ML) 0.083% nebulizer solution 2.5 mg  2.5 mg Nebulization Once PRN ,  C, MD      . alteplase (CATHFLO ACTIVASE) injection 2 mg  2 mg Intracatheter Once PRN Lequita Asal, MD      . EPINEPHrine (ADRENALIN) 1 MG/10ML injection 0.25 mg  0.25 mg Intravenous Once PRN ,  C, MD      . EPINEPHrine (ADRENALIN) 1 MG/10ML injection 0.25 mg  0.25 mg Intravenous Once PRN ,  C, MD      . heparin lock flush 100 unit/mL  500 Units Intracatheter Once PRN Nolon Stalls C, MD      . heparin lock flush 100 unit/mL  250 Units Intracatheter Once PRN Nolon Stalls C, MD      . heparin lock flush 100 unit/mL  500 Units Intravenous Once ,  C, MD      . sodium chloride flush (NS) 0.9 % injection 10 mL  10 mL Intravenous PRN Nolon Stalls C, MD   10 mL at 03/04/19 0959  . sodium chloride flush (NS) 0.9 % injection 10 mL  10 mL Intracatheter Once PRN Nolon Stalls C, MD      . sodium chloride flush (NS) 0.9 %  injection 10 mL  10 mL Intravenous PRN Lequita Asal, MD   10 mL at 11/08/19 0910  . sodium chloride flush (NS) 0.9 % injection 3 mL  3 mL Intracatheter Once PRN Lequita Asal, MD        Review of Systems  Constitutional: Positive for weight loss (1 lb). Negative for chills, diaphoresis, fever and malaise/fatigue.       Feels "alright".  HENT: Negative for congestion, ear discharge, ear pain, hearing loss,  nosebleeds, sinus pain, sore throat and tinnitus.   Eyes: Negative for blurred vision.  Respiratory: Negative for cough (chronic; better), hemoptysis, sputum production and shortness of breath.   Cardiovascular: Negative for chest pain, palpitations and leg swelling.  Gastrointestinal: Negative for abdominal pain, blood in stool, constipation, diarrhea, heartburn, melena, nausea and vomiting.  Genitourinary: Negative for dysuria, frequency, hematuria and urgency.  Musculoskeletal: Positive for back pain (stable). Negative for joint pain (shoulder; resolved), myalgias and neck pain (better).  Skin: Negative for itching and rash.  Neurological: Negative for dizziness, tingling, speech change, weakness and headaches.  Endo/Heme/Allergies: Does not bruise/bleed easily.  Psychiatric/Behavioral: Negative for depression and memory loss. The patient is not nervous/anxious (chronic on Xanax) and does not have insomnia.   All other systems reviewed and are negative.   Performance status (ECOG): 1  Vitals Blood pressure 100/68, pulse 89, temperature (!) 97.2 F (36.2 C), temperature source Tympanic, resp. rate 18, height 5' 6" (1.676 m), weight 138 lb 7.2 oz (62.8 kg), SpO2 98 %.   Physical Exam  Constitutional: He is oriented to person, place, and time. He appears well-developed and well-nourished. No distress.  HENT:  Head: Normocephalic and atraumatic.  Mouth/Throat: Oropharynx is clear and moist. No oropharyngeal exudate.  Thin brown graying hair. Mustache. Dentures. Mask.    Eyes: Pupils are equal, round, and reactive to light. Conjunctivae and EOM are normal. No scleral icterus.  Glasses. Gray blue eyes.  Cardiovascular: Normal rate, regular rhythm and normal heart sounds.  No murmur heard. Pulmonary/Chest: Effort normal and breath sounds normal. No respiratory distress. He has no wheezes. He has no rales. He exhibits no tenderness.  Abdominal: Soft. Bowel sounds are normal. He exhibits no distension and no mass. There is no abdominal tenderness. There is no rebound and no guarding.  Musculoskeletal:        General: Tenderness (mid back) present. No edema. Normal range of motion.     Cervical back: Normal range of motion and neck supple.  Lymphadenopathy:    He has no cervical adenopathy.    He has no axillary adenopathy.       Right: No supraclavicular adenopathy present.       Left: No supraclavicular adenopathy present.  Neurological: He is alert and oriented to person, place, and time.  Skin: Skin is warm and dry. He is not diaphoretic.  Psychiatric: He has a normal mood and affect. His behavior is normal. Judgment and thought content normal.  Nursing note and vitals reviewed.   Infusion on 11/08/2019  Component Date Value Ref Range Status  . Magnesium 11/08/2019 1.9  1.7 - 2.4 mg/dL Final   Performed at [facility], [address] [place], [place] [address]  . Sodium 11/08/2019 137  135 - 145 mmol/L Final  . Potassium 11/08/2019 3.9  3.5 - 5.1 mmol/L Final  . Chloride 11/08/2019 103  98 - 111 mmol/L Final  . CO2 11/08/2019 22  22 - 32 mmol/L Final  . Glucose, Bld 11/08/2019 188* 70 - 99 mg/dL Final   Glucose reference range applies only to samples taken after fasting for at least 8 hours.  . BUN 11/08/2019 13  8 - 23 mg/dL Final  . Creatinine, Ser 11/08/2019 1.18  0.61 - 1.24 mg/dL Final  . Calcium 11/08/2019 9.0  8.9 - 10.3 mg/dL Final  . Total Protein 11/08/2019 7.1  6.5 - 8.1 g/dL Final  . Albumin 11/08/2019 3.5  3.5 - 5.0  g/dL Final  . AST  11/08/2019 19  15 - 41 U/L Final  . ALT 11/08/2019 8  0 - 44 U/L Final  . Alkaline Phosphatase 11/08/2019 79  38 - 126 U/L Final  . Total Bilirubin 11/08/2019 0.5  0.3 - 1.2 mg/dL Final  . GFR calc non Af Amer 11/08/2019 >60  >60 mL/min Final  . GFR calc Af Amer 11/08/2019 >60  >60 mL/min Final  . Anion gap 11/08/2019 12  5 - 15 Final   Performed at [facility], [address], [place], [place] [address]  . WBC 11/08/2019 8.7  4.0 - 10.5 K/uL Final  . RBC 11/08/2019 3.72* 4.22 - 5.81 MIL/uL Final  . Hemoglobin 11/08/2019 10.7* 13.0 - 17.0 g/dL Final  . HCT 11/08/2019 34.5* 39.0 - 52.0 % Final  . MCV 11/08/2019 92.7  80.0 - 100.0 fL Final  . MCH 11/08/2019 28.8  26.0 - 34.0 pg Final  . MCHC 11/08/2019 31.0  30.0 - 36.0 g/dL Final  . RDW 11/08/2019 15.9* 11.5 - 15.5 % Final  . Platelets 11/08/2019 391  150 - 400 K/uL Final  . nRBC 11/08/2019 0.0  0.0 - 0.2 % Final  . Neutrophils Relative % 11/08/2019 85  % Final  . Neutro Abs 11/08/2019 7.4  1.7 - 7.7 K/uL Final  . Lymphocytes Relative 11/08/2019 9  % Final  . Lymphs Abs 11/08/2019 0.7  0.7 - 4.0 K/uL Final  . Monocytes Relative 11/08/2019 5  % Final  . Monocytes Absolute 11/08/2019 0.4  0.1 - 1.0 K/uL Final  . Eosinophils Relative 11/08/2019 0  % Final  . Eosinophils Absolute 11/08/2019 0.0  0.0 - 0.5 K/uL Final  . Basophils Relative 11/08/2019 0  % Final  . Basophils Absolute 11/08/2019 0.0  0.0 - 0.1 K/uL Final  . Immature Granulocytes 11/08/2019 1  % Final  . Abs Immature Granulocytes 11/08/2019 0.06  0.00 - 0.07 K/uL Final   Performed at [facility], [address], [place], [place] [address]    Assessment:  Jimmy West is a 69 y.o. male with metastatichigh-grade adenocarcinomaof the right lungs/p CT-guided biopsy of a RLL lung mass on 05/11/2018. Pathologyrevealed an invasive high-grade adenocarcinoma, with predominantly solid growth pattern. The neoplastic cells were  TTF-1 (+), Napsin A (+), and P40 (+). He has a T4 vertebral metastasis. Clinical stage is T4N1M1.  There was not enough material for Foundation One testing. PDL-1revealed TPS 90%.  PET scanon 04/30/2018 revealed a 3.4 cm hypermetabolic RLL pulmonary mass (SUV 13.2), 11 mm pulmonary nodule in the LEFT lung (SUV 4.5), RIGHT paratracheal lymph node (SUV 5.1), and hypermetabolic activity within the T4 vertebral body (SUV 10.2).   Thoracic spine MRIon 05/03/2018 revealed T4 metastasiswith a 40% pathologic compression deformity and 5 mm of retropulsion of the vertebral body. Retropulsion results in mild spinal canal stenosis and mild bilateral C4-5 foraminal stenosis. There was paravertebral soft tissue thickening from mid T3 to mid T5, likely representing edema related to the pathologic compression deformity vs. possible extraosseous extension of the neoplasm. There were no additional thoracic spinal metastases noted.   Head MRIon 05/03/2018 revealed no intracranial metastatic disease. 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 Incidental mention made of mild paranasal sinus disease.  Hereceived 11cycles ofpembrolizumab(05/24/2018 - 02/07/2019). He toleratedtreatment well. CEAwas 1.3 on 08/30/2018.LDHwas 165 on 11/29/2018.  He completed T4 radiationon  07/07/2018. He receives Xgevamonthly (06/03/2018 -07/18/2019).  He received 1 cycle of carboplatin, Alimta, and pembrolizumabon 02/08/2019. Cycle #1 was complicated by nausea, vomiting, and diarrhea necessitating hospitalization.Decision made to hold carboplatin with his second dose.He iss/p2cycles ofAlimta and pembrolizumab (02/28/2019 - 03/29/2019).Cycle #2 was complicated by nausea, vomiting, and dehydration requring fluids in clinic and an  overnight stay in the hospital. Hereceivedcarboplatin (AUC 2), Alimta, and pebrolizumab(04/18/2019).He tolerated it poorly.   Hehasreceived10 cycles ofAlimta and pembrolizumabon 05/09/2019(last03/23/2021). He tolerated well. He receives B12every 9 weeks (last03/07/2019).  He hascancer-related painin T4. He is currently taking Fentanyl75 mcg/hr and oxycodone10-70m every 4 hours prn.  [facility] unchanged severe compression deformity/vertebral plana of T4 with a stable degree of retropulsion and focal mild spinal canal stenosis. There was interval enlargement of a right lower lobe pulmonary mass(4.5 x 3.5 cm compared to 3.9 x 2.9 cm)with redemonstrated spiculated pulmonary nodules.  Chest, abdomen, pelvisCTon 01/28/2019 revealed slight interval enlargement of a right lower lobe mass with central necrosis measuring 4.6 x 3.4 cm, previously 4.0 x 3.0 cm. There was no change in right upper lobe nodules(1.4 and 0.8 cm).Therewas almost no residua of left lung nodules, with irregular opacities in the apical left upper lobeandsuperior segment left lower lobe. There was no change in right hilar soft tissue and lymph nodes.There wasvertebra plana deformity of T4andno evidence of new osseous metastatic disease. There was no evidence of distant metastatic disease in the abdomen or pelvis.The1.6 x 1.1 cmleft adrenal nodule(non-metabolic on prior PET scan),was unchanged.  Abdomen and pelvis CTon 04/23/2019 revealed no acute abdominal or pelvic pathology. There was fluid in the colon which can be seen with diarrhea. There was diverticulosis without evidence of diverticulitis. There was a stable 1.5 cm left adrenal nodule.  Chest, abdomen, and pelvisCTon 10/16/2020revealed a positive response to interval therapy for the known right lower lobe lung carcinoma(4.6 x 3.4 cm to3.1 x 2.8 cm transversely). There  hadbeen a mild interval decrease contiguous soft tissue that extends along the right hilum. The two right upper lobe noduleswerestable from the most recent prior study (smaller than 04/27/2018).There are no new lung nodules.Therewasno evidence of new metastatic disease.There was stable severe compression deformity/vertebra plana of T4.Therewasno evidence of other osseous metastatic disease.There was a stable left adrenal nodule consistent with an adenoma.  ChestCT angiogramon 05/30/2019 revealed a tiny subsegmental pulmonary embolusin the right lower lobe. There areas of ground-glass at the periphery of the right chest hadbecome more conspicuous. This mayrelate to mild pneumonitis, perhaps due to radiation, attention on follow-up.The dominant nodule in the right lung basewasstable in size. Heis onEliquis.  Chest, abdomen and pelvis CTon 09/16/2019 revealed asimilar-appearing spiculated right lower lobe nodule.There was no evidence for metastatic disease in the abdomen or pelvis.There was stable left adrenal adenoma.  He has cervicalgia.Cervical spine MRIon 08/14/2019 showed no evidence of metastatic disease within the cervical spine. Heis followedby Dr YIzora Ribas  Hehad transient renal insufficiencyon 03/23/2019. Creatinine was 1.32 (baseline 0.61-1.0). Renal ultrasoundon 03/24/2019 revealed no acute abnormality identified.There was no hydronephrosis or bladder distention.Creatinine is 0.82 today.  Stool waspositive for C difficile + diarrheaon 06/14/2019.Hebeganoral vancomycinbeginning11/27/2020.  He received the influenza vaccineon 05/16/2019.  Symptomatically, he is doing well.  He denies any increased shortness of breath.  Exam is stable.  Plan: 1.  Labs today: CBC with diff, CMP, TSH, free T4.  2. Metastatic high-grade adenocarcinoma the RIGHT lung He is s/p 11 cycles of pembrolizumab. He is s/p 1 cycle of carboplatin, Almita,  and  pembrolizumab (02/08/2019). He is s/p 1 cycle of carboplatin (AUC 2), Alimta, and pembrolizumab (04/18/2019). He is s/p10cycles of Alimta and pembrolizumab (02/28/2019 - 03/29/2019; 05/09/2019-10/11/2019). Hecontinues treatment without carboplatin. Restaging CT scans on 02/26/2021revealed stable disease. Discuss continued maintenance treatment.. Labs reviewed.  Begin cycle #11 Alimta and pembrolizumab. Patient confirmed that he took his premedications. Patient sees B12 every 9 weeks (due 11/22/2019). Continue daily folic acid. Discuss symptom management.  He has antiemetics and pain medications at home to use on a prn bases.  Interventions are adequate.   .  3. Bone metastasis Symptomatically, he is doing well Last Xgeva on 11/01/2019. Continue Xgeva monthly. 4. Cancer related pain Pain related to malignancy is well controlled. He has intermittent mid back pain He is off his Fentanyl patch.  He uses oxycodone as needed. 5. Cervical spine pain Etiologyunrelated to malignancy. Cervical spine MRI on 01/22/2020revealed no metastasis. Patient is followed by Dr. Izora Ribas. 6.Anxiety Continue low-dose Xanax. 7.Pulmonary embolism Continue Eliquis long-term.  Ensure platelets>50,000 8.Chemotherapy induced anemia Hematocrit34.5. Hemoglobin 10.7. MCV92.7today. Ferritin773with an iron saturation of 14% and a TIBC of349on 09/20/2019. He received Retacrit on 11/01/2019.  Continue Retacrit per protocol. 9.   Cycle #11 Alimta and pembrolizumab today. 10.   B12 on 11/22/2019. 11.    RTC in 10 days for labs (CBC with diff). 12.   RTC in 3 weeks for MD assessment, labs (CBC with diff, CMP, Mg, TSH), Xgeva, and cycle  #12 Alimta and pembrolizumab.  I discussed the assessment and treatment plan with the patient.  The patient was provided an opportunity to ask questions and all were answered.  The patient agreed with the plan and demonstrated an understanding of the instructions.  The patient was advised to call back if the symptoms worsen or if the condition fails to improve as anticipated.   Lequita Asal, MD, PhD    11/08/2019, 10:11 AM  I, Selena Batten, am acting as scribe for [employer]. Mike Gip, MD, PhD.  I, Evelise Reine C. Mike Gip, MD, have reviewed the above documentation for accuracy and completeness, and I agree with the above.

## 2019-11-07 NOTE — Progress Notes (Signed)
The patient c/o upper back pain ( level 3). The patient Name and DOB has been verified by phone today.

## 2019-11-08 ENCOUNTER — Inpatient Hospital Stay: Payer: Medicare Other

## 2019-11-08 ENCOUNTER — Encounter: Payer: Self-pay | Admitting: Hematology and Oncology

## 2019-11-08 ENCOUNTER — Inpatient Hospital Stay (HOSPITAL_BASED_OUTPATIENT_CLINIC_OR_DEPARTMENT_OTHER): Payer: Medicare Other | Admitting: Hematology and Oncology

## 2019-11-08 VITALS — BP 100/68 | HR 89 | Temp 97.2°F | Resp 18 | Ht 66.0 in | Wt 138.4 lb

## 2019-11-08 VITALS — BP 115/74 | HR 73 | Temp 97.7°F | Resp 18

## 2019-11-08 DIAGNOSIS — C7951 Secondary malignant neoplasm of bone: Secondary | ICD-10-CM

## 2019-11-08 DIAGNOSIS — C3431 Malignant neoplasm of lower lobe, right bronchus or lung: Secondary | ICD-10-CM | POA: Diagnosis not present

## 2019-11-08 DIAGNOSIS — G893 Neoplasm related pain (acute) (chronic): Secondary | ICD-10-CM

## 2019-11-08 DIAGNOSIS — D6481 Anemia due to antineoplastic chemotherapy: Secondary | ICD-10-CM | POA: Diagnosis not present

## 2019-11-08 DIAGNOSIS — T451X5A Adverse effect of antineoplastic and immunosuppressive drugs, initial encounter: Secondary | ICD-10-CM

## 2019-11-08 DIAGNOSIS — Z5111 Encounter for antineoplastic chemotherapy: Secondary | ICD-10-CM

## 2019-11-08 DIAGNOSIS — F419 Anxiety disorder, unspecified: Secondary | ICD-10-CM

## 2019-11-08 DIAGNOSIS — D631 Anemia in chronic kidney disease: Secondary | ICD-10-CM | POA: Diagnosis not present

## 2019-11-08 DIAGNOSIS — I2782 Chronic pulmonary embolism: Secondary | ICD-10-CM

## 2019-11-08 DIAGNOSIS — Z5112 Encounter for antineoplastic immunotherapy: Secondary | ICD-10-CM

## 2019-11-08 LAB — COMPREHENSIVE METABOLIC PANEL
ALT: 8 U/L (ref 0–44)
AST: 19 U/L (ref 15–41)
Albumin: 3.5 g/dL (ref 3.5–5.0)
Alkaline Phosphatase: 79 U/L (ref 38–126)
Anion gap: 12 (ref 5–15)
BUN: 13 mg/dL (ref 8–23)
CO2: 22 mmol/L (ref 22–32)
Calcium: 9 mg/dL (ref 8.9–10.3)
Chloride: 103 mmol/L (ref 98–111)
Creatinine, Ser: 1.18 mg/dL (ref 0.61–1.24)
GFR calc Af Amer: >60 mL/min (ref >60)
GFR calc non Af Amer: >60 mL/min (ref >60)
Glucose, Bld: 188 mg/dL — ABNORMAL HIGH (ref 70–99)
Potassium: 3.9 mmol/L (ref 3.5–5.1)
Sodium: 137 mmol/L (ref 135–145)
Total Bilirubin: 0.5 mg/dL (ref 0.3–1.2)
Total Protein: 7.1 g/dL (ref 6.5–8.1)

## 2019-11-08 LAB — CBC WITH DIFFERENTIAL/PLATELET
Abs Immature Granulocytes: 0.06 10*3/uL (ref 0.00–0.07)
Basophils Absolute: 0 10*3/uL (ref 0.0–0.1)
Basophils Relative: 0 %
Eosinophils Absolute: 0 10*3/uL (ref 0.0–0.5)
Eosinophils Relative: 0 %
HCT: 34.5 % — ABNORMAL LOW (ref 39.0–52.0)
Hemoglobin: 10.7 g/dL — ABNORMAL LOW (ref 13.0–17.0)
Immature Granulocytes: 1 %
Lymphocytes Relative: 9 %
Lymphs Abs: 0.7 10*3/uL (ref 0.7–4.0)
MCH: 28.8 pg (ref 26.0–34.0)
MCHC: 31 g/dL (ref 30.0–36.0)
MCV: 92.7 fL (ref 80.0–100.0)
Monocytes Absolute: 0.4 10*3/uL (ref 0.1–1.0)
Monocytes Relative: 5 %
Neutro Abs: 7.4 10*3/uL (ref 1.7–7.7)
Neutrophils Relative %: 85 %
Platelets: 391 10*3/uL (ref 150–400)
RBC: 3.72 MIL/uL — ABNORMAL LOW (ref 4.22–5.81)
RDW: 15.9 % — ABNORMAL HIGH (ref 11.5–15.5)
WBC: 8.7 10*3/uL (ref 4.0–10.5)
nRBC: 0 % (ref 0.0–0.2)

## 2019-11-08 LAB — MAGNESIUM: Magnesium: 1.9 mg/dL (ref 1.7–2.4)

## 2019-11-08 MED ORDER — HEPARIN SOD (PORK) LOCK FLUSH 100 UNIT/ML IV SOLN
500.0000 [IU] | Freq: Once | INTRAVENOUS | Status: AC
  Administered 2019-11-08: 500 [IU] via INTRAVENOUS
  Filled 2019-11-08: qty 5

## 2019-11-08 MED ORDER — SODIUM CHLORIDE 0.9 % IV SOLN
200.0000 mg | Freq: Once | INTRAVENOUS | Status: AC
  Administered 2019-11-08: 200 mg via INTRAVENOUS
  Filled 2019-11-08: qty 8

## 2019-11-08 MED ORDER — SODIUM CHLORIDE 0.9% FLUSH
10.0000 mL | INTRAVENOUS | Status: DC | PRN
  Administered 2019-11-08: 10 mL via INTRAVENOUS
  Filled 2019-11-08: qty 10

## 2019-11-08 MED ORDER — PALONOSETRON HCL INJECTION 0.25 MG/5ML
0.2500 mg | Freq: Once | INTRAVENOUS | Status: AC
  Administered 2019-11-08: 0.25 mg via INTRAVENOUS

## 2019-11-08 MED ORDER — SODIUM CHLORIDE 0.9 % IV SOLN
500.0000 mg/m2 | Freq: Once | INTRAVENOUS | Status: AC
  Administered 2019-11-08: 900 mg via INTRAVENOUS
  Filled 2019-11-08: qty 20

## 2019-11-08 MED ORDER — SODIUM CHLORIDE 0.9 % IV SOLN
Freq: Once | INTRAVENOUS | Status: AC
  Filled 2019-11-08: qty 250

## 2019-11-11 ENCOUNTER — Other Ambulatory Visit: Payer: Self-pay | Admitting: *Deleted

## 2019-11-11 DIAGNOSIS — G893 Neoplasm related pain (acute) (chronic): Secondary | ICD-10-CM

## 2019-11-11 DIAGNOSIS — F419 Anxiety disorder, unspecified: Secondary | ICD-10-CM

## 2019-11-11 MED ORDER — ALPRAZOLAM 0.5 MG PO TABS: 0.5000 mg | ORAL_TABLET | Freq: Four times a day (QID) | ORAL | 0 refills | Status: DC | PRN

## 2019-11-11 MED ORDER — OXYCODONE HCL 20 MG PO TABS: ORAL_TABLET | ORAL | 0 refills | Status: DC

## 2019-11-18 ENCOUNTER — Inpatient Hospital Stay: Payer: Medicare Other

## 2019-11-18 ENCOUNTER — Telehealth: Payer: Self-pay

## 2019-11-18 ENCOUNTER — Other Ambulatory Visit: Payer: Self-pay

## 2019-11-18 DIAGNOSIS — Z79899 Other long term (current) drug therapy: Secondary | ICD-10-CM | POA: Diagnosis not present

## 2019-11-18 DIAGNOSIS — C7951 Secondary malignant neoplasm of bone: Secondary | ICD-10-CM

## 2019-11-18 DIAGNOSIS — R7989 Other specified abnormal findings of blood chemistry: Secondary | ICD-10-CM | POA: Diagnosis not present

## 2019-11-18 DIAGNOSIS — Z5111 Encounter for antineoplastic chemotherapy: Secondary | ICD-10-CM

## 2019-11-18 DIAGNOSIS — Z5112 Encounter for antineoplastic immunotherapy: Secondary | ICD-10-CM

## 2019-11-18 DIAGNOSIS — Z7901 Long term (current) use of anticoagulants: Secondary | ICD-10-CM | POA: Diagnosis not present

## 2019-11-18 DIAGNOSIS — C3431 Malignant neoplasm of lower lobe, right bronchus or lung: Secondary | ICD-10-CM | POA: Diagnosis not present

## 2019-11-18 DIAGNOSIS — D709 Neutropenia, unspecified: Secondary | ICD-10-CM | POA: Diagnosis not present

## 2019-11-18 DIAGNOSIS — Z7952 Long term (current) use of systemic steroids: Secondary | ICD-10-CM | POA: Diagnosis not present

## 2019-11-18 DIAGNOSIS — G893 Neoplasm related pain (acute) (chronic): Secondary | ICD-10-CM

## 2019-11-18 DIAGNOSIS — Z87891 Personal history of nicotine dependence: Secondary | ICD-10-CM | POA: Diagnosis not present

## 2019-11-18 DIAGNOSIS — I2782 Chronic pulmonary embolism: Secondary | ICD-10-CM

## 2019-11-18 DIAGNOSIS — I252 Old myocardial infarction: Secondary | ICD-10-CM | POA: Diagnosis not present

## 2019-11-18 LAB — CBC WITH DIFFERENTIAL/PLATELET
Abs Immature Granulocytes: 0 10*3/uL (ref 0.00–0.07)
Basophils Absolute: 0 10*3/uL (ref 0.0–0.1)
Basophils Relative: 1 %
Eosinophils Absolute: 0.1 10*3/uL (ref 0.0–0.5)
Eosinophils Relative: 6 %
HCT: 26.5 % — ABNORMAL LOW (ref 39.0–52.0)
Hemoglobin: 8.5 g/dL — ABNORMAL LOW (ref 13.0–17.0)
Lymphocytes Relative: 30 %
Lymphs Abs: 0.7 10*3/uL (ref 0.7–4.0)
MCH: 28.4 pg (ref 26.0–34.0)
MCHC: 32.1 g/dL (ref 30.0–36.0)
MCV: 88.6 fL (ref 80.0–100.0)
Monocytes Absolute: 0.4 10*3/uL (ref 0.1–1.0)
Monocytes Relative: 18 %
Neutro Abs: 1 10*3/uL — ABNORMAL LOW (ref 1.7–7.7)
Neutrophils Relative %: 45 %
Platelets: 110 10*3/uL — ABNORMAL LOW (ref 150–400)
RBC Morphology: NONE SEEN
RBC: 2.99 MIL/uL — ABNORMAL LOW (ref 4.22–5.81)
RDW: 15 % (ref 11.5–15.5)
WBC: 2.2 10*3/uL — ABNORMAL LOW (ref 4.0–10.5)
nRBC: 0 % (ref 0.0–0.2)

## 2019-11-18 NOTE — Telephone Encounter (Signed)
Spoke with the patient to inform him that he is Neutropenic precautions( instructiongiven to the patient) I have informed the patient if he get a fever, chills or not feeling well, Please contact the office. The patient was understanding and agreeable.

## 2019-11-18 NOTE — Telephone Encounter (Signed)
-----   Message from Lequita Asal, MD sent at 11/18/2019  9:54 AM EDT ----- Regarding: Please call patient  Neutropenic precautions.  Patient to call if fever, chills or not feeling well.  M ----- Message ----- From: Buel Ream, [facility] [place] Sent: 11/18/2019   9:24 AM EDT To: Lequita Asal, MD

## 2019-11-22 ENCOUNTER — Other Ambulatory Visit: Payer: Self-pay

## 2019-11-22 ENCOUNTER — Inpatient Hospital Stay: Payer: Medicare Other | Attending: Hematology and Oncology

## 2019-11-22 VITALS — BP 113/76 | HR 96 | Temp 97.4°F | Resp 18

## 2019-11-22 DIAGNOSIS — D631 Anemia in chronic kidney disease: Secondary | ICD-10-CM | POA: Diagnosis not present

## 2019-11-22 DIAGNOSIS — F419 Anxiety disorder, unspecified: Secondary | ICD-10-CM | POA: Insufficient documentation

## 2019-11-22 DIAGNOSIS — R634 Abnormal weight loss: Secondary | ICD-10-CM | POA: Diagnosis not present

## 2019-11-22 DIAGNOSIS — T451X5A Adverse effect of antineoplastic and immunosuppressive drugs, initial encounter: Secondary | ICD-10-CM | POA: Insufficient documentation

## 2019-11-22 DIAGNOSIS — C3431 Malignant neoplasm of lower lobe, right bronchus or lung: Secondary | ICD-10-CM | POA: Insufficient documentation

## 2019-11-22 DIAGNOSIS — G893 Neoplasm related pain (acute) (chronic): Secondary | ICD-10-CM | POA: Diagnosis not present

## 2019-11-22 DIAGNOSIS — Z9221 Personal history of antineoplastic chemotherapy: Secondary | ICD-10-CM | POA: Insufficient documentation

## 2019-11-22 DIAGNOSIS — C7951 Secondary malignant neoplasm of bone: Secondary | ICD-10-CM | POA: Insufficient documentation

## 2019-11-22 DIAGNOSIS — Z86711 Personal history of pulmonary embolism: Secondary | ICD-10-CM | POA: Diagnosis not present

## 2019-11-22 DIAGNOSIS — Z79899 Other long term (current) drug therapy: Secondary | ICD-10-CM | POA: Insufficient documentation

## 2019-11-22 DIAGNOSIS — D6481 Anemia due to antineoplastic chemotherapy: Secondary | ICD-10-CM | POA: Insufficient documentation

## 2019-11-22 DIAGNOSIS — E538 Deficiency of other specified B group vitamins: Secondary | ICD-10-CM | POA: Insufficient documentation

## 2019-11-22 DIAGNOSIS — Z7901 Long term (current) use of anticoagulants: Secondary | ICD-10-CM | POA: Diagnosis not present

## 2019-11-22 MED ORDER — CYANOCOBALAMIN 1000 MCG/ML IJ SOLN
1000.0000 ug | Freq: Once | INTRAMUSCULAR | Status: AC
  Administered 2019-11-22: 1000 ug via INTRAMUSCULAR

## 2019-11-22 NOTE — Progress Notes (Unsigned)
Pharmacist Chemotherapy Monitoring - Follow Up Assessment    I verify that I have reviewed each item in the below checklist:  . Regimen for the patient is scheduled for the appropriate day and plan matches scheduled date. Marland Kitchen Appropriate non-routine labs are ordered dependent on drug ordered. . If applicable, additional medications reviewed and ordered per protocol based on lifetime cumulative doses and/or treatment regimen.   Plan for follow-up and/or issues identified: No . I-vent associated with next due treatment: No . MD and/or nursing notified: No  Chistina Roston K 11/22/2019 1:42 PM

## 2019-11-22 NOTE — Patient Instructions (Signed)

## 2019-11-24 ENCOUNTER — Inpatient Hospital Stay (HOSPITAL_BASED_OUTPATIENT_CLINIC_OR_DEPARTMENT_OTHER): Payer: Medicare Other | Admitting: Hospice and Palliative Medicine

## 2019-11-24 DIAGNOSIS — C7951 Secondary malignant neoplasm of bone: Secondary | ICD-10-CM

## 2019-11-24 DIAGNOSIS — Z515 Encounter for palliative care: Secondary | ICD-10-CM

## 2019-11-24 DIAGNOSIS — F32A Depression, unspecified: Secondary | ICD-10-CM

## 2019-11-24 DIAGNOSIS — F329 Major depressive disorder, single episode, unspecified: Secondary | ICD-10-CM

## 2019-11-24 NOTE — Progress Notes (Signed)
Virtual Visit via Telephone Note  I connected with Jimmy West on 11/24/19 at  1:00 PM EDT by telephone and verified that I am speaking with the correct person using two identifiers.   I discussed the limitations, risks, security and privacy concerns of performing an evaluation and management service by telephone and the availability of in person appointments. I also discussed with the patient that there may be a patient responsible charge related to this service. The patient expressed understanding and agreed to proceed.   History of Present Illness: Mr. 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 Patient has had chronic neoplasm related pain and severe anxiety/depression.  He was referred to palliative care clinic to assist with symptom management and to establish treatment goals.   Observations/Objective: I called and spoke with patient by phone.  He reports stable pain and is using the oxycodone about 4 times a day.  He says that he has had worse depression over the past month.  He says that he spends most of the day in chair.  He denies overt fatigue but says that he feels "hopeless."  He is questioned if it is worth continuing to "fight this".  He denies SI/HI.  Patient does continue to take his Prozac daily.  He also continues to use alprazolam as needed.  I suspect that he would benefit from adjustment of his antidepressant regimen.  Patient is in agreement to see psychiatry and I will refer him to Dr. Nicolasa Ducking. Assessment and Plan: Stage IV lung cancer -on treatment immunotherapy  Neoplasm related pain -continue oxycodone as needed  Anxiety -continue alprazolam as needed  Depression -continue Prozac.  Referral to psychiatry  Case and plan discussed with Dr. Mike Gip  Follow Up Instructions: Follow-up virtual visit about a month   I discussed the assessment and treatment plan with the  patient. The patient was provided an opportunity to ask questions and all were answered. The patient agreed with the plan and demonstrated an understanding of the instructions.   The patient was advised to call back or seek an in-person evaluation if the symptoms worsen or if the condition fails to improve as anticipated.  I provided 15 minutes of non-face-to-face time during this encounter.   Irean Hong, NP

## 2019-11-24 NOTE — Progress Notes (Signed)
[name]  [address], [address] [place] [address] Phone: [phone]  Fax: [phone]   Clinic Day:  11/29/2019  Referring physician: [name], NP  Chief Complaint: [place] is a 69 y.o. male with metastatic adenocarcinoma of the lung who is seen for assessment prior to cycle #12 Alimta and pembrolizumab.  HPI: The patient was last seen in the medical oncologyclinic on 11/08/2019. At that time, he felt "alright".  He was off Levaquin following a left sided infiltrate. His breathing was good. His back pain was stable. His neck pain was better. He denied shoulder pain.  Hematocrit was 34.5, hemoglobin 10.7, platelets 391,000, WBC 8,700. CMP was normal.  He received cycle #11 Alimta and pembrolizumab.   Labs on 11/18/2019 included hematocrit 26.5, hemoglobin 8.5, platelets 110,000, WBC 2,200 (ANC 1,000).   He received a B12 injection on 11/22/2019 for ongoing Alimta treatment.   He had a virtual visit with [place] Harm, NP on 11/24/2019.  He reported stable pain and was using oxycodone about 4 times a day.  He had worsening depression over the past month.  He spent most of the day in a chair. He denied overt fatigue but stated that he felt "hopeless".  He was questioned if it was worth continuing to "fight this".  He denied suicidal or homicidal ideation.  Patient continued to take his Prozac daily.  He also continued to use alprazolam as needed. Borders, NP suspect that he would benefit from adjustment of his antidepressant regimen.  Patient was in agreement to see psychiatry; he was referred to Dr. Nicolasa Ducking.  During the interim, the patient felt "fine". He tolerated his treatment. Patient states he doesn't have much of an appetite.  He notes that he is eating well. He is down 2 lbs since 11/08/2019. He is drinking ensure and boost several times a week. I recommended he eat foods high in calories and drink ensure and boost 1-2 times per  day. He would like to take some ensure and boost samples home today. He agreed to a consult with [place], RD.   He takes an OTC counter sleep aid he doesn't know the name of. He had nausea and vomiting a couple days ago. He thought he had taken zofran but took steroid. He took his steroids yesterday. He remains on folic acid, He feels like he needs a higher dose of nausea medication. He notes feeling more winded lately with minimal exertion but he has not been very active. It took him 2 days to mow the lawn. He has some congestion with and runny and stopped up nose due to allergies. His back pain is about the same. His neck pain is better.    Past Medical History:  Diagnosis Date  . Bulging lumbar disc   . Cancer ([place])    stage 4 lung cancer  . Heart attack [facility])     Past Surgical History:  Procedure Laterality Date  . CARDIAC CATHETERIZATION     two stents  . KNEE SURGERY Left   . PORTA CATH INSERTION N/A 05/21/2018   Procedure: PORTA CATH INSERTION;  Surgeon: [place] Huxley, MD;  Location: [facility];  Service: Cardiovascular;  Laterality: N/A;    Family History  Problem Relation Age of Onset  . Heart failure Father   . Cancer Maternal Aunt   . Heart failure Maternal Uncle   . Dementia Paternal Grandmother   . Cancer Maternal Aunt  Social History:  reports that he quit smoking about 16 years ago. His smoking use included cigarettes. He has a 22.50 pack-year smoking history. He has never used smokeless tobacco. He reports current alcohol use of about 15.0 standard drinks of alcohol per week. He reports that he does not use drugs. He started smoking at age 84. He is smoking 1 1/2 packs/day. Patient denies known exposures to radiation ortoxins. 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 He has not been working recently as the salon was closed. He plays golf occasionally.He has 8 cats. The patient is alone today.  Allergies: No Known  Allergies  Current Medications: Current Outpatient Medications  Medication Sig Dispense Refill  . acetaminophen (TYLENOL) 500 MG tablet Take 500 mg by mouth 3 (three) times daily as needed.    Marland Kitchen albuterol (VENTOLIN HFA) 108 (90 Base) MCG/ACT inhaler Inhale 2 puffs into the lungs every 6 (six) hours as needed for wheezing or shortness of breath. 18 g 3  . ALPRAZolam (XANAX) 0.5 MG tablet Take 1 tablet (0.5 mg total) by mouth 4 (four) times daily as needed for anxiety. 60 tablet 0  . apixaban (ELIQUIS) 5 MG TABS tablet Take 1 tablet (36m) twice daily 60 tablet 0  . Calcium 600-400 MG-UNIT CHEW Chew 2 tablets by mouth daily.    .Marland Kitchendexamethasone (DECADRON) 4 MG tablet Take 1 tab two times a day the day before Alimta chemo, then take 2 tabs once a day for 3 days starting the day after chemo. 30 tablet 1  . FLUoxetine (PROZAC) 40 MG capsule Take 1 capsule (40 mg total) by mouth daily. 30 capsule 3  . folic acid (FOLVITE) 1 MG tablet Take 1 tablet (1 mg total) by mouth daily. Start 5-7 days before Alimta chemotherapy. Continue until 21 days after Alimta completed. 100 tablet 3  . ibuprofen (ADVIL,MOTRIN) 200 MG tablet Take 200 mg by mouth every 6 (six) hours as needed.    . Oxycodone HCl 20 MG TABS TAKE 1/2 TO 1 TABLET BY MOUTH EVERY 4 HOURS AS NEEDED FOR SEVERE PAIN 60 tablet 0  . polyethylene glycol (MIRALAX / GLYCOLAX) packet Take 17 g by mouth daily.    .Marland Kitchensenna (SENOKOT) 8.6 MG tablet Take 1 tablet by mouth as needed.     .Marland Kitchenlevofloxacin (LEVAQUIN) 750 MG tablet Take 1 tablet (750 mg total) by mouth daily. (Patient not taking: Reported on 10/31/2019) 5 tablet 0  . ondansetron (ZOFRAN ODT) 4 MG disintegrating tablet Take 1 tablet (4 mg total) by mouth every 6 (six) hours as needed for nausea or vomiting. (Patient not taking: Reported on 11/07/2019) 30 tablet 1   No current facility-administered medications for this visit.   Facility-Administered Medications Ordered in Other Visits  Medication Dose  Route Frequency Provider Last Rate Last Admin  . 0.9 %  sodium chloride infusion   Intravenous Once ,  C, MD      . 0.9 %  sodium chloride infusion   Intravenous Once PRN ,  C, MD      . albuterol (PROVENTIL) (2.5 MG/3ML) 0.083% nebulizer solution 2.5 mg  2.5 mg Nebulization Once PRN ,  C, MD      . alteplase (CATHFLO ACTIVASE) injection 2 mg  2 mg Intracatheter Once PRN CNolon StallsC, MD      . EPINEPHrine (ADRENALIN) 1 MG/10ML injection 0.25 mg  0.25 mg Intravenous Once PRN CLequita Asal MD      . EPINEPHrine (  ADRENALIN) 1 MG/10ML injection 0.25 mg  0.25 mg Intravenous Once PRN ,  C, MD      . heparin lock flush 100 unit/mL  500 Units Intracatheter Once PRN Mike Gip,  C, MD      . heparin lock flush 100 unit/mL  250 Units Intracatheter Once PRN Nolon Stalls C, MD      . heparin lock flush 100 unit/mL  500 Units Intravenous Once ,  C, MD      . sodium chloride flush (NS) 0.9 % injection 10 mL  10 mL Intravenous PRN Lequita Asal, MD   10 mL at 03/04/19 0959  . sodium chloride flush (NS) 0.9 % injection 10 mL  10 mL Intracatheter Once PRN Nolon Stalls C, MD      . sodium chloride flush (NS) 0.9 % injection 10 mL  10 mL Intravenous PRN Lequita Asal, MD   10 mL at 11/29/19 0908  . sodium chloride flush (NS) 0.9 % injection 3 mL  3 mL Intracatheter Once PRN Lequita Asal, MD        Review of Systems  Constitutional: Positive for weight loss (2 lbs). Negative for chills, diaphoresis, fever and malaise/fatigue.       Feels "alright".  HENT: Positive for congestion. Negative for ear discharge, ear pain, hearing loss, nosebleeds, sinus pain, sore throat and tinnitus.        Rhinorrhea/stopped up due to allergies.   Eyes: Negative for blurred vision.  Respiratory: Positive for shortness of breath (minimal exertion). Negative for cough (chronic; better), hemoptysis and sputum  production.   Cardiovascular: Negative for chest pain, palpitations and leg swelling.  Gastrointestinal: Positive for nausea and vomiting. Negative for abdominal pain, blood in stool, constipation, diarrhea, heartburn and melena.       Decreased appetite.  Genitourinary: Negative for dysuria, frequency, hematuria and urgency.  Musculoskeletal: Positive for back pain (stable). Negative for joint pain, myalgias and neck pain (better).  Skin: Negative for itching and rash.  Neurological: Negative for dizziness, tingling, sensory change, weakness and headaches.  Endo/Heme/Allergies: Positive for environmental allergies. Does not bruise/bleed easily.  Psychiatric/Behavioral: Negative for depression and memory loss. The patient has insomnia (on OTC medication). The patient is not nervous/anxious (chronic on Xanax).   All other systems reviewed and are negative.   Performance status (ECOG): 1  Vitals Blood pressure 116/70, pulse 84, temperature 97.8 F (36.6 C), temperature source Tympanic, resp. rate 16, weight 136 lb 0.4 oz (61.7 kg), SpO2 98 %.   Physical Exam  Constitutional: He is oriented to person, place, and time. He appears well-developed and well-nourished. No distress.  HENT:  Head: Normocephalic and atraumatic.  Mouth/Throat: Oropharynx is clear and moist. No oropharyngeal exudate.  Thin brown graying hair. Mustache. Dentures. Mask.  Eyes: Pupils are equal, round, and reactive to light. Conjunctivae and EOM are normal. No scleral icterus.  Glasses. Gray blue eyes.  Cardiovascular: Normal rate, regular rhythm and normal heart sounds.  No murmur heard. Pulmonary/Chest: Effort normal and breath sounds normal. No respiratory distress. He has no wheezes. He has no rales. He exhibits no tenderness.  Abdominal: Soft. Bowel sounds are normal. He exhibits no distension and no mass. There is no abdominal tenderness. There is no rebound and no guarding.  Musculoskeletal:        General:  Tenderness (mid back) present. No edema. Normal range of motion.     Cervical back: Normal range of motion and neck supple.  Lymphadenopathy:  He has no cervical adenopathy.  Neurological: He is alert and oriented to person, place, and time.  Skin: Skin is warm and dry. He is not diaphoretic.  Mild abrasion on right hand.  Psychiatric: He has a normal mood and affect. His behavior is normal. Judgment and thought content normal.  Nursing note and vitals reviewed.   Imaging studies: 04/30/2018:  PET scanrevealed a 3.4 cm hypermetabolic RLL pulmonary mass (SUV 13.2), 11 mm pulmonary nodule in the LEFT lung (SUV 4.5), RIGHT paratracheal lymph node (SUV 5.1), and hypermetabolic activity within the T4 vertebral body (SUV 10.2).  05/03/2018:  Thoracic spine MRIon 05/03/2018 revealed T4 metastasiswith a 40% pathologic compression deformity and 5 mm of retropulsion of the vertebral body. Retropulsion results in mild spinal canal stenosis and mild bilateral C4-5 foraminal stenosis. There was paravertebral soft tissue thickening from mid T3 to mid T5, likely representing edema related to the pathologic compression deformity vs. possible extraosseous extension of the neoplasm. There were no additional thoracic spinal metastases noted.  05/03/2018:  Head MRIrevealed no intracranial metastatic disease. 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 Incidental mention made of mild paranasal sinus disease. 01/10/2019:  Cervical and thoracic spine CT at [facility] unchanged severe compression deformity/vertebral plana of T4 with a stable degree of retropulsion and focal mild spinal canal stenosis. There was interval enlargement of a right lower lobe pulmonary mass(4.5 x 3.5 cm compared to 3.9 x 2.9 cm)with redemonstrated spiculated  pulmonary nodules. 01/28/2019:  Chest, abdomen, pelvisCTrevealed slight interval enlargement of a right lower lobe mass with central necrosis measuring 4.6 x 3.4 cm, previously 4.0 x 3.0 cm. There was no change in right upper lobe nodules(1.4 and 0.8 cm).Therewas almost no residua of left lung nodules, with irregular opacities in the apical left upper lobeandsuperior segment left lower lobe. There was no change in right hilar soft tissue and lymph nodes.There wasvertebra plana deformity of T4andno evidence of new osseous metastatic disease.There was no evidence of distant metastatic disease in the abdomen or pelvis.The1.6 x 1.1 cmleft adrenal nodule(non-metabolic on prior PET scan),was unchanged. 03/24/2019:  Renal ultrasoundrevealed no acute abnormality identified.There was no hydronephrosis or bladder distention. 04/23/2019:  Abdomen and pelvis CT revealed no acute abdominal or pelvic pathology. There was fluid in the colon which can be seen with diarrhea. There was diverticulosis without evidence of diverticulitis. There was a stable 1.5 cm left adrenal nodule. 05/06/2019:  Chest, abdomen, and pelvisCTrevealed a positive response to interval therapy for the known right lower lobe lung carcinoma(4.6 x 3.4 cm to3.1 x 2.8 cm transversely). There hadbeen a mild interval decrease contiguous soft tissue that extends along the right hilum. The two right upper lobe noduleswerestable from the most recent prior study (smaller than 04/27/2018).There are no new lung nodules.Therewasno evidence of new metastatic disease.There was stable severe compression deformity/vertebra plana of T4.Therewasno evidence of other osseous metastatic disease.There was a stable left adrenal nodule consistent with an adenoma. 05/30/2019:  ChestCT angiogramrevealed a tiny subsegmental pulmonary embolusin the right lower lobe. There areas of ground-glass at the periphery of the right chest  hadbecome more conspicuous. This mayrelate to mild pneumonitis, perhaps due to radiation, attention on follow-up.The dominant nodule in the right lung basewasstable in size. Heis onEliquis. 08/14/2019:  Cervical spine MRIrevealed no evidence of metastatic disease within the cervical spine. 09/16/2019:  Chest, abdomen and pelvis CTrevealed asimilar-appearing  spiculated right lower lobe nodule.There was no evidence for metastatic disease in the abdomen or pelvis.There was stable left adrenal adenoma.   Infusion on 11/29/2019  Component Date Value Ref Range Status  . WBC 11/29/2019 6.6  4.0 - 10.5 K/uL Final  . RBC 11/29/2019 3.20* 4.22 - 5.81 MIL/uL Final  . Hemoglobin 11/29/2019 9.3* 13.0 - 17.0 g/dL Final  . HCT 11/29/2019 30.0* 39.0 - 52.0 % Final  . MCV 11/29/2019 93.8  80.0 - 100.0 fL Final  . MCH 11/29/2019 29.1  26.0 - 34.0 pg Final  . MCHC 11/29/2019 31.0  30.0 - 36.0 g/dL Final  . RDW 11/29/2019 16.1* 11.5 - 15.5 % Final  . Platelets 11/29/2019 413* 150 - 400 K/uL Final  . nRBC 11/29/2019 0.0  0.0 - 0.2 % Final  . Neutrophils Relative % 11/29/2019 56  % Final  . Neutro Abs 11/29/2019 3.7  1.7 - 7.7 K/uL Final  . Lymphocytes Relative 11/29/2019 18  % Final  . Lymphs Abs 11/29/2019 1.2  0.7 - 4.0 K/uL Final  . Monocytes Relative 11/29/2019 15  % Final  . Monocytes Absolute 11/29/2019 1.0  0.1 - 1.0 K/uL Final  . Eosinophils Relative 11/29/2019 9  % Final  . Eosinophils Absolute 11/29/2019 0.6* 0.0 - 0.5 K/uL Final  . Basophils Relative 11/29/2019 1  % Final  . Basophils Absolute 11/29/2019 0.0  0.0 - 0.1 K/uL Final  . Immature Granulocytes 11/29/2019 1  % Final  . Abs Immature Granulocytes 11/29/2019 0.05  0.00 - 0.07 K/uL Final   Performed at [facility], [address] [place], [place] [address]    Assessment:  Jesson Foskey is a 69 y.o. male with metastatichigh-grade adenocarcinomaof the right lungs/p CT-guided biopsy of a RLL lung mass on  05/11/2018. Pathologyrevealed an invasive high-grade adenocarcinoma, with predominantly solid growth pattern. The neoplastic cells were TTF-1 (+), Napsin A (+), and P40 (+). He has a T4 vertebral metastasis. Clinical stage is T4N1M1.  There was not enough material for [facility] One testing. PDL-1revealed TPS 90%.  PET scanon 04/30/2018 revealed a 3.4 cm hypermetabolic RLL pulmonary mass (SUV 13.2), 11 mm pulmonary nodule in the LEFT lung (SUV 4.5), RIGHT paratracheal lymph node (SUV 5.1), and hypermetabolic activity within the T4 vertebral body (SUV 10.2).   Thoracic spine MRIon 05/03/2018 revealed T4 metastasiswith a 40% pathologic compression deformity and 5 mm of retropulsion of the vertebral body. Retropulsion results in mild spinal canal stenosis and mild bilateral C4-5 foraminal stenosis. There was paravertebral soft tissue thickening from mid T3 to mid T5, likely representing edema related to the pathologic compression deformity vs. possible extraosseous extension of the neoplasm. There were no additional thoracic spinal metastases noted.   Head MRIon 05/03/2018 revealed no intracranial metastatic disease. 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 Incidental mention made of mild paranasal sinus disease.  Hereceived 11cycles ofpembrolizumab(05/24/2018 - 02/07/2019). He toleratedtreatment well. CEAwas 1.3 on 08/30/2018.LDHwas 165 on 11/29/2018.  He completed T4 radiationon 07/07/2018. He receives Xgevamonthly (06/03/2018 -11/01/2019).  He received 1 cycle of carboplatin, Alimta, and pembrolizumabon 02/08/2019. Cycle #1 was complicated by nausea, vomiting, and diarrhea necessitating hospitalization.Decision made to hold carboplatin with his second dose.He iss/p2cycles ofAlimta  and pembrolizumab (02/28/2019 - 03/29/2019).Cycle #2 was complicated by nausea, vomiting, and dehydration requring fluids in clinic and an overnight stay in the hospital.  Hereceivedcarboplatin (AUC 2), Alimta, and pebrolizumab(04/18/2019).He tolerated it poorly.   Hehasreceived11 cycles ofAlimta and pembrolizumabon 05/09/2019(last04/20/2021). He tolerated well. He receives B12every 9 weeks (last05/10/2019).  He hascancer-related painin T4. He is off the [employer].  He is taking oxycodone10-61m every 4 hours prn.  Chest, abdomen and pelvis CTon 09/16/2019 revealed asimilar-appearing spiculated right lower lobe nodule.There was no evidence for metastatic disease in the abdomen or pelvis.There was stable left adrenal adenoma.  He has cervicalgia.Cervical spine MRIon 08/14/2019 showed no evidence of metastatic disease within the cervical spine. Heis followedby Dr YIzora Ribas  He has chemotherapy induced anemia.  He received Retacrit on 11/01/2019.  Hehad transient renal insufficiencyon 03/23/2019. Creatinine was 1.32 (baseline 0.61-1.0). Renal ultrasoundon 03/24/2019 revealed no acute abnormality identified.There was no hydronephrosis or bladder distention.Creatinine is 0.82 today.  Stool waspositive for C difficile + diarrheaon 06/14/2019.Hewas treated with oral vancomycin.  He received the influenza vaccineon 05/16/2019.  Symptomatically, he feels "fine".  He has struggled emotionally since COVID-19 and the loss of his job.  Pain is well controlled.  He denies any respiratory symptoms.  Plan: 1.  Labs today: CBC with diff, CMP, TSH. 2. Metastatic high-grade adenocarcinoma the RIGHT lung He is s/p 11 cycles of pembrolizumab. He is s/p 1 cycle of carboplatin, Almita, and pembrolizumab (02/08/2019). He is s/p 1 cycle of carboplatin (AUC 2), Alimta, and pembrolizumab (04/18/2019). He is s/p11cycles of Alimta and pembrolizumab  (02/28/2019 - 03/29/2019; 05/09/2019-11/08/2019). CT scans on 02/26/2021revealed stable disease. Labs reviewed.  Begin cycle #12 Alimta and pembrolizumab.  Patient took his Decadron premedication Continue B12 every 9 weeks (last 05/04/202). Continue folic acid.  Discuss use of Granix for WBC support. Discuss plan for restaging studies.    Discuss symptom management.  He has antiemetics and pain medications at home to use on a prn bases.  Interventions are adequate.    3. Bone metastasis Symptomatically, he is doing very well. Last Xgeva on04/13/2021. 4. Cancer related pain Pain due to malignancy remains well controlled He has intermittent mid back pain (stable). Pain medications written by JAltha Harm NP. 5.Anxiety Patient remains on low-dose Xanax. 6.Pulmonary embolism Patient remains on long-term Eliquis. Ensure platelets>50,000 8.Chemotherapy induced anemia Hematocrit30.8. Hemoglobin9.3. MCV93.8today. Ferritin773with an iron saturation of 14% and a TIBC of349on 09/20/2019. Review plan for Retacrit in the future. 9.   Weight loss  Discussed importance of caloric intake.  Weight loss may be affected by mood.  RN to provide patient with Ensure/Boost (chocolate/strawberry). 10.   No Xgeva today. 11.   Cycle #12 Almita and pembrolizumab today. 12.   Chest CT without contrast on 12/16/2019. 13.   Preauth Granix. 14.   RTC in 10 days for labs (CBC with diff, BMP), +/- Retacrit, and +/- Granix 15.   RTC in 3 weeks for MD assessment, labs (CBC with diff, CMP, Mg, TSH), and cycle #13 pembrolizumab and Alimta.  I discussed the assessment and treatment plan with the patient.  The patient was provided an opportunity to ask questions and all were answered.  The patient agreed with  the plan and demonstrated an understanding of the instructions.  The patient was advised to call back if the symptoms worsen or if the condition fails to improve as anticipated.   MLequita Asal MD, PhD    11/29/2019, 9:31 AM  I, ASelena Batten am acting as scribe for [employer] CMike Gip MD, PhD.  I,  C. CMike Gip MD, have reviewed the above documentation for accuracy and completeness, and I agree with the above.

## 2019-11-28 ENCOUNTER — Other Ambulatory Visit: Payer: Self-pay | Admitting: *Deleted

## 2019-11-28 DIAGNOSIS — F419 Anxiety disorder, unspecified: Secondary | ICD-10-CM

## 2019-11-28 DIAGNOSIS — G893 Neoplasm related pain (acute) (chronic): Secondary | ICD-10-CM

## 2019-11-28 MED ORDER — FLUOXETINE HCL 40 MG PO CAPS: 40.0000 mg | ORAL_CAPSULE | Freq: Every day | ORAL | 3 refills | Status: DC

## 2019-11-28 MED ORDER — ALPRAZOLAM 0.5 MG PO TABS: 0.5000 mg | ORAL_TABLET | Freq: Four times a day (QID) | ORAL | 0 refills | Status: DC | PRN

## 2019-11-28 MED ORDER — OXYCODONE HCL 20 MG PO TABS: ORAL_TABLET | ORAL | 0 refills | Status: DC

## 2019-11-29 ENCOUNTER — Other Ambulatory Visit: Payer: Self-pay

## 2019-11-29 ENCOUNTER — Encounter: Payer: Self-pay | Admitting: *Deleted

## 2019-11-29 ENCOUNTER — Other Ambulatory Visit: Payer: Self-pay | Admitting: Hematology and Oncology

## 2019-11-29 ENCOUNTER — Inpatient Hospital Stay (HOSPITAL_BASED_OUTPATIENT_CLINIC_OR_DEPARTMENT_OTHER): Payer: Medicare Other | Admitting: Hematology and Oncology

## 2019-11-29 ENCOUNTER — Encounter: Payer: Self-pay | Admitting: Hematology and Oncology

## 2019-11-29 ENCOUNTER — Inpatient Hospital Stay: Payer: Medicare Other

## 2019-11-29 VITALS — BP 120/72 | HR 68 | Temp 97.6°F | Resp 18

## 2019-11-29 VITALS — BP 116/70 | HR 84 | Temp 97.8°F | Resp 16 | Wt 136.0 lb

## 2019-11-29 DIAGNOSIS — T451X5A Adverse effect of antineoplastic and immunosuppressive drugs, initial encounter: Secondary | ICD-10-CM | POA: Diagnosis not present

## 2019-11-29 DIAGNOSIS — Z5111 Encounter for antineoplastic chemotherapy: Secondary | ICD-10-CM

## 2019-11-29 DIAGNOSIS — C7951 Secondary malignant neoplasm of bone: Secondary | ICD-10-CM

## 2019-11-29 DIAGNOSIS — C3431 Malignant neoplasm of lower lobe, right bronchus or lung: Secondary | ICD-10-CM

## 2019-11-29 DIAGNOSIS — I2699 Other pulmonary embolism without acute cor pulmonale: Secondary | ICD-10-CM

## 2019-11-29 DIAGNOSIS — G893 Neoplasm related pain (acute) (chronic): Secondary | ICD-10-CM

## 2019-11-29 DIAGNOSIS — Z5112 Encounter for antineoplastic immunotherapy: Secondary | ICD-10-CM

## 2019-11-29 DIAGNOSIS — E538 Deficiency of other specified B group vitamins: Secondary | ICD-10-CM | POA: Diagnosis not present

## 2019-11-29 DIAGNOSIS — D6481 Anemia due to antineoplastic chemotherapy: Secondary | ICD-10-CM | POA: Diagnosis not present

## 2019-11-29 DIAGNOSIS — Z7189 Other specified counseling: Secondary | ICD-10-CM

## 2019-11-29 DIAGNOSIS — D631 Anemia in chronic kidney disease: Secondary | ICD-10-CM | POA: Diagnosis not present

## 2019-11-29 DIAGNOSIS — R634 Abnormal weight loss: Secondary | ICD-10-CM

## 2019-11-29 DIAGNOSIS — I2782 Chronic pulmonary embolism: Secondary | ICD-10-CM

## 2019-11-29 LAB — COMPREHENSIVE METABOLIC PANEL
ALT: 9 U/L (ref 0–44)
AST: 22 U/L (ref 15–41)
Albumin: 3.2 g/dL — ABNORMAL LOW (ref 3.5–5.0)
Alkaline Phosphatase: 64 U/L (ref 38–126)
Anion gap: 9 (ref 5–15)
BUN: 11 mg/dL (ref 8–23)
CO2: 24 mmol/L (ref 22–32)
Calcium: 8.7 mg/dL — ABNORMAL LOW (ref 8.9–10.3)
Chloride: 104 mmol/L (ref 98–111)
Creatinine, Ser: 1.3 mg/dL — ABNORMAL HIGH (ref 0.61–1.24)
GFR calc Af Amer: >60 mL/min (ref >60)
GFR calc non Af Amer: 56 mL/min — ABNORMAL LOW (ref >60)
Glucose, Bld: 112 mg/dL — ABNORMAL HIGH (ref 70–99)
Potassium: 3.8 mmol/L (ref 3.5–5.1)
Sodium: 137 mmol/L (ref 135–145)
Total Bilirubin: 0.6 mg/dL (ref 0.3–1.2)
Total Protein: 6.8 g/dL (ref 6.5–8.1)

## 2019-11-29 LAB — MAGNESIUM: Magnesium: 1.8 mg/dL (ref 1.7–2.4)

## 2019-11-29 LAB — CBC WITH DIFFERENTIAL/PLATELET
Abs Immature Granulocytes: 0.05 10*3/uL (ref 0.00–0.07)
Basophils Absolute: 0 10*3/uL (ref 0.0–0.1)
Basophils Relative: 1 %
Eosinophils Absolute: 0.6 10*3/uL — ABNORMAL HIGH (ref 0.0–0.5)
Eosinophils Relative: 9 %
HCT: 30 % — ABNORMAL LOW (ref 39.0–52.0)
Hemoglobin: 9.3 g/dL — ABNORMAL LOW (ref 13.0–17.0)
Immature Granulocytes: 1 %
Lymphocytes Relative: 18 %
Lymphs Abs: 1.2 10*3/uL (ref 0.7–4.0)
MCH: 29.1 pg (ref 26.0–34.0)
MCHC: 31 g/dL (ref 30.0–36.0)
MCV: 93.8 fL (ref 80.0–100.0)
Monocytes Absolute: 1 10*3/uL (ref 0.1–1.0)
Monocytes Relative: 15 %
Neutro Abs: 3.7 10*3/uL (ref 1.7–7.7)
Neutrophils Relative %: 56 %
Platelets: 413 10*3/uL — ABNORMAL HIGH (ref 150–400)
RBC: 3.2 MIL/uL — ABNORMAL LOW (ref 4.22–5.81)
RDW: 16.1 % — ABNORMAL HIGH (ref 11.5–15.5)
WBC: 6.6 10*3/uL (ref 4.0–10.5)
nRBC: 0 % (ref 0.0–0.2)

## 2019-11-29 LAB — TSH: TSH: 2.218 u[IU]/mL (ref 0.350–4.500)

## 2019-11-29 MED ORDER — PALONOSETRON HCL INJECTION 0.25 MG/5ML
INTRAVENOUS | Status: AC
  Filled 2019-11-29: qty 5

## 2019-11-29 MED ORDER — HEPARIN SOD (PORK) LOCK FLUSH 100 UNIT/ML IV SOLN
500.0000 [IU] | Freq: Once | INTRAVENOUS | Status: AC
  Administered 2019-11-29: 500 [IU] via INTRAVENOUS
  Filled 2019-11-29: qty 5

## 2019-11-29 MED ORDER — PALONOSETRON HCL INJECTION 0.25 MG/5ML
0.2500 mg | Freq: Once | INTRAVENOUS | Status: AC
  Administered 2019-11-29: 0.25 mg via INTRAVENOUS

## 2019-11-29 MED ORDER — SODIUM CHLORIDE 0.9% FLUSH
10.0000 mL | INTRAVENOUS | Status: DC | PRN
  Administered 2019-11-29: 10 mL via INTRAVENOUS
  Filled 2019-11-29: qty 10

## 2019-11-29 MED ORDER — ONDANSETRON 8 MG PO TBDP: 8.0000 mg | ORAL_TABLET | Freq: Three times a day (TID) | ORAL | 1 refills | Status: DC | PRN

## 2019-11-29 MED ORDER — SODIUM CHLORIDE 0.9 % IV SOLN
400.0000 mg/m2 | Freq: Once | INTRAVENOUS | Status: AC
  Administered 2019-11-29: 700 mg via INTRAVENOUS
  Filled 2019-11-29: qty 8

## 2019-11-29 MED ORDER — SODIUM CHLORIDE 0.9 % IV SOLN
Freq: Once | INTRAVENOUS | Status: AC
  Filled 2019-11-29: qty 250

## 2019-11-29 MED ORDER — SODIUM CHLORIDE 0.9 % IV SOLN
200.0000 mg | Freq: Once | INTRAVENOUS | Status: AC
  Administered 2019-11-29: 200 mg via INTRAVENOUS
  Filled 2019-11-29: qty 8

## 2019-11-29 NOTE — Progress Notes (Signed)
  Oncology Nurse Navigator Documentation  Navigator Location: [facility] (11/29/19 0900)   )Navigator Encounter Type: Letter/Fax/Email (11/29/19 0900)                         Barriers/Navigation Needs: Coordination of Care (11/29/19 0900)   Interventions: Referrals (11/29/19 0900) Referrals: Other(Psychiatry) (11/29/19 0900) Coordination of Care: Appts (11/29/19 0900)             Per Billey Chang, NP, pt needs referral to Dr. Cephus Shelling to evaluate and treat depression. Notes and demographics faxed to Dr. Waylan Boga office [phone]) for review and to schedule appt. Nothing further needed at this time.     Time Spent with Patient: 30 (11/29/19 0900)

## 2019-11-29 NOTE — Progress Notes (Signed)
Patient states he doesn't have much of an appetite. He is at 136lb (down from 138lb on 11/08/19). He takes an OTC counter sleep aid he doesn't know the name of. He had nausea and vomiting a couple days ago. He thought he had taken zofran but took steroid. He notes feeling more winded lately but he has been very active.

## 2019-12-09 ENCOUNTER — Other Ambulatory Visit: Payer: Self-pay

## 2019-12-09 ENCOUNTER — Inpatient Hospital Stay: Payer: Medicare Other

## 2019-12-09 VITALS — BP 108/70 | HR 74 | Temp 97.0°F | Resp 18

## 2019-12-09 DIAGNOSIS — C3431 Malignant neoplasm of lower lobe, right bronchus or lung: Secondary | ICD-10-CM

## 2019-12-09 DIAGNOSIS — Z5111 Encounter for antineoplastic chemotherapy: Secondary | ICD-10-CM

## 2019-12-09 DIAGNOSIS — C7951 Secondary malignant neoplasm of bone: Secondary | ICD-10-CM

## 2019-12-09 DIAGNOSIS — D631 Anemia in chronic kidney disease: Secondary | ICD-10-CM | POA: Diagnosis not present

## 2019-12-09 DIAGNOSIS — T451X5A Adverse effect of antineoplastic and immunosuppressive drugs, initial encounter: Secondary | ICD-10-CM

## 2019-12-09 DIAGNOSIS — E538 Deficiency of other specified B group vitamins: Secondary | ICD-10-CM | POA: Diagnosis not present

## 2019-12-09 DIAGNOSIS — D6481 Anemia due to antineoplastic chemotherapy: Secondary | ICD-10-CM

## 2019-12-09 DIAGNOSIS — G893 Neoplasm related pain (acute) (chronic): Secondary | ICD-10-CM

## 2019-12-09 DIAGNOSIS — Z5112 Encounter for antineoplastic immunotherapy: Secondary | ICD-10-CM

## 2019-12-09 DIAGNOSIS — I2782 Chronic pulmonary embolism: Secondary | ICD-10-CM

## 2019-12-09 LAB — BASIC METABOLIC PANEL
Anion gap: 11 (ref 5–15)
BUN: 12 mg/dL (ref 8–23)
CO2: 27 mmol/L (ref 22–32)
Calcium: 9.1 mg/dL (ref 8.9–10.3)
Chloride: 99 mmol/L (ref 98–111)
Creatinine, Ser: 1.31 mg/dL — ABNORMAL HIGH (ref 0.61–1.24)
GFR calc Af Amer: >60 mL/min (ref >60)
GFR calc non Af Amer: 55 mL/min — ABNORMAL LOW (ref >60)
Glucose, Bld: 125 mg/dL — ABNORMAL HIGH (ref 70–99)
Potassium: 3.3 mmol/L — ABNORMAL LOW (ref 3.5–5.1)
Sodium: 137 mmol/L (ref 135–145)

## 2019-12-09 LAB — CBC WITH DIFFERENTIAL/PLATELET
Abs Immature Granulocytes: 0.03 10*3/uL (ref 0.00–0.07)
Basophils Absolute: 0 10*3/uL (ref 0.0–0.1)
Basophils Relative: 1 %
Eosinophils Absolute: 0.1 10*3/uL (ref 0.0–0.5)
Eosinophils Relative: 6 %
HCT: 25.9 % — ABNORMAL LOW (ref 39.0–52.0)
Hemoglobin: 8.2 g/dL — ABNORMAL LOW (ref 13.0–17.0)
Immature Granulocytes: 1 %
Lymphocytes Relative: 33 %
Lymphs Abs: 0.7 10*3/uL (ref 0.7–4.0)
MCH: 28.4 pg (ref 26.0–34.0)
MCHC: 31.7 g/dL (ref 30.0–36.0)
MCV: 89.6 fL (ref 80.0–100.0)
Monocytes Absolute: 0.8 10*3/uL (ref 0.1–1.0)
Monocytes Relative: 36 %
Neutro Abs: 0.5 10*3/uL — ABNORMAL LOW (ref 1.7–7.7)
Neutrophils Relative %: 23 %
Platelets: 147 10*3/uL — ABNORMAL LOW (ref 150–400)
RBC: 2.89 MIL/uL — ABNORMAL LOW (ref 4.22–5.81)
RDW: 15.4 % (ref 11.5–15.5)
WBC: 2.3 10*3/uL — ABNORMAL LOW (ref 4.0–10.5)
nRBC: 0 % (ref 0.0–0.2)

## 2019-12-09 MED ORDER — TBO-FILGRASTIM 300 MCG/0.5ML ~~LOC~~ SOSY
300.0000 ug | PREFILLED_SYRINGE | Freq: Once | SUBCUTANEOUS | Status: AC
  Administered 2019-12-09: 300 ug via SUBCUTANEOUS

## 2019-12-09 MED ORDER — EPOETIN ALFA-EPBX 10000 UNIT/ML IJ SOLN
10000.0000 [IU] | Freq: Once | INTRAMUSCULAR | Status: AC
  Administered 2019-12-09: 10000 [IU] via SUBCUTANEOUS
  Filled 2019-12-09: qty 1

## 2019-12-12 ENCOUNTER — Other Ambulatory Visit: Payer: Self-pay | Admitting: *Deleted

## 2019-12-12 ENCOUNTER — Telehealth: Payer: Self-pay | Admitting: Hospice and Palliative Medicine

## 2019-12-12 DIAGNOSIS — G893 Neoplasm related pain (acute) (chronic): Secondary | ICD-10-CM

## 2019-12-12 DIAGNOSIS — F419 Anxiety disorder, unspecified: Secondary | ICD-10-CM

## 2019-12-12 MED ORDER — ALPRAZOLAM 0.5 MG PO TABS: 0.5000 mg | ORAL_TABLET | Freq: Four times a day (QID) | ORAL | 0 refills | Status: DC | PRN

## 2019-12-12 MED ORDER — OXYCODONE HCL 20 MG PO TABS: ORAL_TABLET | ORAL | 0 refills | Status: DC

## 2019-12-12 NOTE — Telephone Encounter (Signed)
Received a call from patient's caregiver, Zadie Rhine.  She reports that patient has had severe depression and has been talking about discontinuing cancer treatment and focusing on end-of-life care.  Patient has a scheduled appointment next week with Dr. Mike Gip at which time they plan to discuss goals.  I had previously referred patient to psychiatry and we will follow up with Dr. Waylan Boga office.  Could consider increasing patient's fluoxetine but would ultimately defer to Dr. Nicolasa Ducking if she has an alternative regimen in mind.

## 2019-12-13 ENCOUNTER — Telehealth: Payer: Self-pay | Admitting: *Deleted

## 2019-12-13 NOTE — Telephone Encounter (Signed)
Contacted Dr. Waylan Boga office to get an update on pt's referral. Per Dr. Waylan Boga office, pt has been accepted and they have attempted to contact patient without success. Informed that pt will need to contact billing/insurance dept first to initiate scheduling process [phone]). Once pt has contacted them, then their office will try to get him in as soon as possible if there is a cancellation.  Called and spoke with patient regarding the info received from Dr. Waylan Boga office. Phone number given to patient and encouraged to contact them today to initiate the scheduling process. Pt verbalized understanding. Nothing further needed at this time.   Dr. Mike Gip and Merrily Pew have been made aware.

## 2019-12-15 ENCOUNTER — Ambulatory Visit
Admission: RE | Admit: 2019-12-15 | Discharge: 2019-12-15 | Disposition: A | Discharge status: Home / Self Care | Payer: Medicare Other | Source: Ambulatory Visit | Attending: Hematology and Oncology | Admitting: Hematology and Oncology

## 2019-12-15 ENCOUNTER — Other Ambulatory Visit: Payer: Self-pay

## 2019-12-15 DIAGNOSIS — C3431 Malignant neoplasm of lower lobe, right bronchus or lung: Secondary | ICD-10-CM | POA: Insufficient documentation

## 2019-12-20 NOTE — Progress Notes (Signed)
[facility]  [address], [address] [place], [place] [address] Phone: [phone]  Fax: [phone]   Clinic Day:  12/21/2019  Referring physician: [name], NP  Chief Complaint: Jimmy West is a 68 y.o. male with  metastatic adenocarcinoma of the lung who is seen for review of imaging studies, discussion regarding direction of therapy, and consideration of cycle #13 Alimta and pembrolizumab.   HPI: The patient was last seen in the medical oncology clinic on 11/29/2019. At that time, he felt "fine". He did not have much of an appetite. He was drinking Ensure and Boost several times a week. He remained on folic acid. He noted feeling more winded with minimal exertion. His back pain remained the same. Hematocrit was 30.0, hemoglobin 9.3, MCV 93.8, platelets 413,000, WBC 6600, ANC 3700. TSH was 2.218. Magnesium was 1.8.  He received cycle #12 Almita and pembrolizumab.  Hematocrit was 25.9, hemoglobin 8.2, MCV 89.6, platelets 147,000, WBC 2300, ANC 500 on 12/09/2019.  He receieved Publishing rights manager.  Chest CT on 12/15/2019 revealed an enlarging right lower lobe mass c/w malignancy (3.0 x 2.4 cm to 3.3 x 2.7 cm).  There was slight enlargement in a spiculated right upper lobe subpleural nodule (1.2 x 0.7 cm to 1.5 x 0.8 cm).  Patient's caregiver, Zadie Rhine, contacted the clinic on 12/12/2019 about the patient's severe depression. He had been talking about discontinuing cancer treatment and focusing on end-of-life care. He had been referred to Dr Waylan Boga office in psychiatry. Lanice Shirts, RN assisted in expediting his appointment.  During the interim, he is doing 'alright'. His back pain is about the same and his use of pain medications is also about the same. He describes his mood as 'no good' and he feels like he is going in a circle. He has received both doses of the COVID-19 vaccine; his last dose was about a month ago. The results of his CT  scan were discussed. He says ever since he quit work, he doesn't get out much or talk to anybody. His appointment with his psychiatrist is on October 5th 2021, which he is unhappy about. He denies any suicidal thoughts. The only thoughts he has had is to discontinue treatment. He is interested in coming back to the clinic with [name] to talk things through. He would like to proceed with treatment today. He is out of folic acid and calcium pills, he plans to pick up more pills either today or tomorrow.    Past Medical History:  Diagnosis Date  . Bulging lumbar disc   . Cancer ([place])    stage 4 lung cancer  . Heart attack [facility])     Past Surgical History:  Procedure Laterality Date  . CARDIAC CATHETERIZATION     two stents  . KNEE SURGERY Left   . PORTA CATH INSERTION N/A 05/21/2018   Procedure: PORTA CATH INSERTION;  Surgeon: Algernon Huxley, MD;  Location: [facility];  Service: Cardiovascular;  Laterality: N/A;    Family History  Problem Relation Age of Onset  . Heart failure Father   . Cancer Maternal Aunt   . Heart failure Maternal Uncle   . Dementia Paternal Grandmother   . Cancer Maternal Aunt     Social History:  reports that he quit smoking about 16 years ago. His smoking use included cigarettes. He has a 22.50 pack-year smoking history. He has never used smokeless tobacco. He reports current alcohol use of about 15.0 standard  drinks of alcohol per week. He reports that he does not use drugs. He started smoking at age 9. He is smoking 1 1/2 packs/day. Patient denies known exposures to radiation ortoxins. 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 He has not been working recently as the salon was closed. He plays golf occasionally.He has 8 cats. The patient is alone  today.  Allergies: No Known Allergies  Current Medications: Current Outpatient Medications  Medication Sig Dispense Refill  . acetaminophen (TYLENOL) 500 MG tablet Take 500 mg  by mouth 3 (three) times daily as needed.    Marland Kitchen albuterol (VENTOLIN HFA) 108 (90 Base) MCG/ACT inhaler Inhale 2 puffs into the lungs every 6 (six) hours as needed for wheezing or shortness of breath. 18 g 3  . ALPRAZolam (XANAX) 0.5 MG tablet Take 1 tablet (0.5 mg total) by mouth 4 (four) times daily as needed for anxiety. 60 tablet 0  . Calcium 600-400 MG-UNIT CHEW Chew 2 tablets by mouth daily.    Marland Kitchen dexamethasone (DECADRON) 4 MG tablet Take 1 tab two times a day the day before Alimta chemo, then take 2 tabs once a day for 3 days starting the day after chemo. 30 tablet 1  . ELIQUIS 5 MG TABS tablet TAKE 1 TABLET BY MOUTH TWICE DAILY 60 tablet 5  . FLUoxetine (PROZAC) 40 MG capsule Take 1 capsule (40 mg total) by mouth daily. 30 capsule 3  . folic acid (FOLVITE) 1 MG tablet Take 1 tablet (1 mg total) by mouth daily. Start 5-7 days before Alimta chemotherapy. Continue until 21 days after Alimta completed. 100 tablet 3  . ibuprofen (ADVIL,MOTRIN) 200 MG tablet Take 200 mg by mouth every 6 (six) hours as needed.    . Oxycodone HCl 20 MG TABS TAKE 1/2 TO 1 TABLET BY MOUTH EVERY 4 HOURS AS NEEDED FOR SEVERE PAIN 60 tablet 0  . polyethylene glycol (MIRALAX / GLYCOLAX) packet Take 17 g by mouth daily.    Marland Kitchen senna (SENOKOT) 8.6 MG tablet Take 1 tablet by mouth as needed.     Marland Kitchen levofloxacin (LEVAQUIN) 750 MG tablet Take 1 tablet (750 mg total) by mouth daily. (Patient not taking: Reported on 10/31/2019) 5 tablet 0  . ondansetron (ZOFRAN ODT) 8 MG disintegrating tablet Take 1 tablet (8 mg total) by mouth every 8 (eight) hours as needed for nausea or vomiting. (Patient not taking: Reported on 12/21/2019) 20 tablet 1   No current facility-administered medications for this visit.   Facility-Administered Medications Ordered in Other Visits  Medication Dose Route Frequency Provider Last Rate Last Admin  . 0.9 %  sodium chloride infusion   Intravenous Once Maryann Mccall C, MD      . 0.9 %  sodium chloride  infusion   Intravenous Once PRN Alando Colleran C, MD      . albuterol (PROVENTIL) (2.5 MG/3ML) 0.083% nebulizer solution 2.5 mg  2.5 mg Nebulization Once PRN Vernadine Coombs C, MD      . alteplase (CATHFLO ACTIVASE) injection 2 mg  2 mg Intracatheter Once PRN Nolon Stalls C, MD      . EPINEPHrine (ADRENALIN) 1 MG/10ML injection 0.25 mg  0.25 mg Intravenous Once PRN Armie Moren C, MD      . EPINEPHrine (ADRENALIN) 1 MG/10ML injection 0.25 mg  0.25 mg Intravenous Once PRN Dawnyel Leven C, MD      . heparin lock flush 100 unit/mL  500 Units Intracatheter Once PRN Lequita Asal, MD      .  heparin lock flush 100 unit/mL  250 Units Intracatheter Once PRN Nolon Stalls C, MD      . heparin lock flush 100 unit/mL  500 Units Intravenous Once Alwin Lanigan C, MD      . sodium chloride flush (NS) 0.9 % injection 10 mL  10 mL Intravenous PRN Lequita Asal, MD   10 mL at 03/04/19 0959  . sodium chloride flush (NS) 0.9 % injection 10 mL  10 mL Intracatheter Once PRN Mounir Skipper C, MD      . sodium chloride flush (NS) 0.9 % injection 3 mL  3 mL Intracatheter Once PRN Lequita Asal, MD        Review of Systems  Constitutional: Positive for weight loss (13 lb). Negative for chills, diaphoresis, fever and malaise/fatigue.       Feels "alright".   HENT: Positive for congestion. Negative for sore throat.        Rhinorrhea, stopped up due to allergies.   Eyes: Negative for blurred vision and double vision.  Respiratory: Positive for shortness of breath (minimal exertion). Negative for cough (chronic; better).   Cardiovascular: Negative for chest pain, palpitations and leg swelling.  Gastrointestinal: Negative for abdominal pain, blood in stool, constipation, diarrhea, heartburn, melena, nausea and vomiting.       Decreased appetite.   Genitourinary: Negative for dysuria, frequency and urgency.  Musculoskeletal: Negative for back pain, falls, myalgias and neck  pain (better).  Skin: Negative for rash.  Neurological: Negative for dizziness, sensory change, focal weakness, weakness and headaches.  Endo/Heme/Allergies: Positive for environmental allergies. Negative for polydipsia. Does not bruise/bleed easily.  Psychiatric/Behavioral: Positive for depression. The patient is nervous/anxious (chronic; on xanax) and has insomnia (on OTC medication).        Has been feeling a lot more "down"  these last few days.    Performance status (ECOG): 1 - Symptomatic but completely ambulatory  Vitals Blood pressure 119/74, pulse 80, temperature 97.9 F (36.6 C), temperature source Oral, resp. rate 16, weight 123 lb 12.8 oz (56.2 kg), SpO2 98 %.   Physical Exam  Constitutional: He is oriented to person, place, and time. He appears well-developed and well-nourished. No distress. Face mask in place.  Thin brown graying hair. Mustache. Dentures  HENT:  Head: Normocephalic and atraumatic.  Right Ear: Hearing normal.  Left Ear: Hearing normal.  Mouth/Throat: Oropharynx is clear and moist and mucous membranes are normal. No oral lesions.  Short grey and white hair.   Eyes: Pupils are equal, round, and reactive to light. Conjunctivae and EOM are normal. Right eye exhibits no discharge. Left eye exhibits no discharge. No scleral icterus.  Grey/blue eyes. Glasses.   Neck: No JVD present.  Cardiovascular: Normal rate, regular rhythm, normal heart sounds and intact distal pulses. Exam reveals no gallop and no friction rub.  No murmur heard. Pulmonary/Chest: Effort normal and breath sounds normal. No respiratory distress. He has no wheezes. He has no rhonchi. He has no rales. He exhibits no tenderness.  Abdominal: Soft. Normal appearance and bowel sounds are normal. He exhibits no distension and no mass. There is no hepatosplenomegaly. There is no abdominal tenderness. There is no rebound, no guarding and no CVA tenderness.  Musculoskeletal:        General: No tenderness  or edema. Normal range of motion.     Cervical back: Normal range of motion and neck supple.  Lymphadenopathy:       Head (right side): No preauricular, no posterior auricular  and no occipital adenopathy present.       Head (left side): No preauricular, no posterior auricular and no occipital adenopathy present.    He has no cervical adenopathy.    He has no axillary adenopathy.       Right: No inguinal and no supraclavicular adenopathy present.       Left: No inguinal and no supraclavicular adenopathy present.  Neurological: He is alert and oriented to person, place, and time.  Skin: Skin is warm, dry and intact. No bruising, no lesion and no rash noted. He is not diaphoretic. No erythema. No pallor.  Psychiatric: He has a normal mood and affect. His behavior is normal. Judgment and thought content normal.    Imaging studies: 04/30/2018:  PET scanrevealed a 3.4 cm hypermetabolic RLL pulmonary mass (SUV 13.2), 11 mm pulmonary nodule in the LEFT lung (SUV 4.5), RIGHT paratracheal lymph node (SUV 5.1), and hypermetabolic activity within the T4 vertebral body (SUV 10.2).  05/03/2018:  Thoracic spine MRIon 05/03/2018 revealed T4 metastasiswith a 40% pathologic compression deformity and 5 mm of retropulsion of the vertebral body. Retropulsion results in mild spinal canal stenosis and mild bilateral C4-5 foraminal stenosis. There was paravertebral soft tissue thickening from mid T3 to mid T5, likely representing edema related to the pathologic compression deformity vs. possible extraosseous extension of the neoplasm. There were no additional thoracic spinal metastases noted.  05/03/2018:  Head MRIrevealed no intracranial metastatic disease. 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 Incidental mention made of mild paranasal  sinus disease. 01/10/2019:  Cervical and thoracic spine CT at [facility] unchanged severe compression deformity/vertebral plana of T4 with a stable degree of retropulsion and focal mild spinal canal stenosis. There was interval enlargement of a right lower lobe pulmonary mass(4.5 x 3.5 cm compared to 3.9 x 2.9 cm)with redemonstrated spiculated pulmonary nodules. 01/28/2019:  Chest, abdomen, pelvisCTrevealed slight interval enlargement of a right lower lobe mass with central necrosis measuring 4.6 x 3.4 cm, previously 4.0 x 3.0 cm. There was no change in right upper lobe nodules(1.4 and 0.8 cm).Therewas almost no residua of left lung nodules, with irregular opacities in the apical left upper lobeandsuperior segment left lower lobe. There was no change in right hilar soft tissue and lymph nodes.There wasvertebra plana deformity of T4andno evidence of new osseous metastatic disease.There was no evidence of distant metastatic disease in the abdomen or pelvis.The1.6 x 1.1 cmleft adrenal nodule(non-metabolic on prior PET scan),was unchanged. 03/24/2019:  Renal ultrasoundrevealed no acute abnormality identified.There was no hydronephrosis or bladder distention. 04/23/2019:  Abdomen and pelvis CT revealed no acute abdominal or pelvic pathology. There was fluid in the colon which can be seen with diarrhea. There was diverticulosis without evidence of diverticulitis. There was a stable 1.5 cm left adrenal nodule. 05/06/2019:  Chest, abdomen, and pelvisCTrevealed a positive response to interval therapy for the known right lower lobe lung carcinoma(4.6 x 3.4 cm to3.1 x 2.8 cm transversely). There hadbeen a mild interval decrease contiguous soft tissue that extends along the right hilum. The two right upper lobe noduleswerestable from the most recent prior study (smaller than 04/27/2018).There are no new lung nodules.Therewasno evidence of new metastatic disease.There was stable  severe compression deformity/vertebra plana of T4.Therewasno evidence of other osseous metastatic disease.There was a stable left adrenal nodule consistent with an adenoma. 05/30/2019:  ChestCT angiogramrevealed a tiny subsegmental  pulmonary embolusin the right lower lobe. There areas of ground-glass at the periphery of the right chest hadbecome more conspicuous. This mayrelate to mild pneumonitis, perhaps due to radiation, attention on follow-up.The dominant nodule in the right lung basewasstable in size. Heis onEliquis. 08/14/2019:  Cervical spine MRIrevealed no evidence of metastatic disease within the cervical spine. 09/16/2019:  Chest, abdomen and pelvis CTrevealed asimilar-appearing spiculated right lower lobe nodule.There was no evidence for metastatic disease in the abdomen or pelvis.There was stable left adrenal adenoma. 12/15/2019:  Chest CT revealed an enlarging right lower lobe mass c/w malignancy (3.0 x 2.4 cm to 3.3 x 2.7 cm).  There was slight enlargement in a spiculated right upper lobe subpleural nodule (0.8 x 1.5 cm to 1.2 x 0.7 cm).   Infusion on 12/21/2019  Component Date Value Ref Range Status  . Magnesium 12/21/2019 2.1  1.7 - 2.4 mg/dL Final   Performed at [facility], [address] [place], [place] [address]  . Sodium 12/21/2019 137  135 - 145 mmol/L Final  . Potassium 12/21/2019 3.8  3.5 - 5.1 mmol/L Final  . Chloride 12/21/2019 105  98 - 111 mmol/L Final  . CO2 12/21/2019 24  22 - 32 mmol/L Final  . Glucose, Bld 12/21/2019 98  70 - 99 mg/dL Final   Glucose reference range applies only to samples taken after fasting for at least 8 hours.  . BUN 12/21/2019 11  8 - 23 mg/dL Final  . Creatinine, Ser 12/21/2019 1.15  0.61 - 1.24 mg/dL Final  . Calcium 12/21/2019 8.4* 8.9 - 10.3 mg/dL Final  . Total Protein 12/21/2019 6.8  6.5 - 8.1 g/dL Final  . Albumin 12/21/2019 3.3* 3.5 - 5.0 g/dL Final  . AST 12/21/2019 20  15 - 41 U/L Final  . ALT  12/21/2019 10  0 - 44 U/L Final  . Alkaline Phosphatase 12/21/2019 62  38 - 126 U/L Final  . Total Bilirubin 12/21/2019 0.5  0.3 - 1.2 mg/dL Final  . GFR calc non Af Amer 12/21/2019 >60  >60 mL/min Final  . GFR calc Af Amer 12/21/2019 >60  >60 mL/min Final  . Anion gap 12/21/2019 8  5 - 15 Final   Performed at [facility], [address] [place], [place] [address]  . WBC 12/21/2019 7.0  4.0 - 10.5 K/uL Final  . RBC 12/21/2019 3.22* 4.22 - 5.81 MIL/uL Final  . Hemoglobin 12/21/2019 9.5* 13.0 - 17.0 g/dL Final  . HCT 12/21/2019 30.5* 39.0 - 52.0 % Final  . MCV 12/21/2019 94.7  80.0 - 100.0 fL Final  . MCH 12/21/2019 29.5  26.0 - 34.0 pg Final  . MCHC 12/21/2019 31.1  30.0 - 36.0 g/dL Final  . RDW 12/21/2019 17.9* 11.5 - 15.5 % Final  . Platelets 12/21/2019 338  150 - 400 K/uL Final  . nRBC 12/21/2019 0.0  0.0 - 0.2 % Final  . Neutrophils Relative % 12/21/2019 72  % Final  . Neutro Abs 12/21/2019 5.0  1.7 - 7.7 K/uL Final  . Lymphocytes Relative 12/21/2019 14  % Final  . Lymphs Abs 12/21/2019 1.0  0.7 - 4.0 K/uL Final  . Monocytes Relative 12/21/2019 8  % Final  . Monocytes Absolute 12/21/2019 0.6  0.1 - 1.0 K/uL Final  . Eosinophils Relative 12/21/2019 5  % Final  . Eosinophils Absolute 12/21/2019 0.3  0.0 - 0.5 K/uL Final  . Basophils Relative 12/21/2019 1  % Final  . Basophils Absolute 12/21/2019 0.1  0.0 - 0.1 K/uL Final  . Immature Granulocytes 12/21/2019 0  % Final  . Abs Immature Granulocytes 12/21/2019 0.03  0.00 - 0.07 K/uL Final   Performed at [facility], [address] [place], [place] [address]    Assessment:  Melford Tullier is a 69 y.o. male with metastatichigh-grade adenocarcinomaof the right lungs/p CT-guided biopsy of a RLL lung mass on 05/11/2018. Pathologyrevealed an invasive high-grade adenocarcinoma, with predominantly solid growth pattern. The neoplastic cells were TTF-1 (+), Napsin A (+), and P40 (+). He has a T4 vertebral  metastasis. Clinical stage is T4N1M1.  There was not enough material for [facility] One testing. PDL-1revealed TPS 90%.  PET scanon 04/30/2018 revealed a 3.4 cm hypermetabolic RLL pulmonary mass (SUV 13.2), 11 mm pulmonary nodule in the LEFT lung (SUV 4.5), RIGHT paratracheal lymph node (SUV 5.1), and hypermetabolic activity within the T4 vertebral body (SUV 10.2).   Thoracic spine MRIon 05/03/2018 revealed T4 metastasiswith a 40% pathologic compression deformity and 5 mm of retropulsion of the vertebral body. Retropulsion results in mild spinal canal stenosis and mild bilateral C4-5 foraminal stenosis. There was paravertebral soft tissue thickening from mid T3 to mid T5, likely representing edema related to the pathologic compression deformity vs. possible extraosseous extension of the neoplasm. There were no additional thoracic spinal metastases noted.   Head MRIon 05/03/2018 revealed no intracranial metastatic disease. 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 Incidental mention made of mild paranasal sinus disease.  Hereceived 11cycles ofpembrolizumab(05/24/2018 - 02/07/2019). He toleratedtreatment well. CEAwas 1.3 on 08/30/2018.LDHwas 165 on 11/29/2018.  He completed T4 radiationon 07/07/2018. He receives Xgevamonthly (06/03/2018 -11/01/2019).  He received 1 cycle of carboplatin, Alimta, and pembrolizumabon 02/08/2019. Cycle #1 was complicated by nausea, vomiting, and diarrhea necessitating hospitalization.Decision made to hold carboplatin with his second dose.He iss/p2cycles ofAlimta and pembrolizumab (02/28/2019 - 03/29/2019).Cycle #2 was complicated by nausea, vomiting, and dehydration requring fluids in clinic and an overnight stay in the hospital. Hereceivedcarboplatin (AUC  2), Alimta, and pebrolizumab(04/18/2019).He tolerated it poorly.   Hehasreceived12 cycles ofAlimta and pembrolizumab(05/09/2019 - 11/29/2019). He tolerated well. He receives B12every 9 weeks (last05/10/2019).  He hascancer-related painin T4. He is off the [employer].  He is taking oxycodone10-34m every 4 hours prn.  Chest, abdomen and pelvis CTon 09/16/2019 revealed asimilar-appearing spiculated right lower lobe nodule.There was no evidence for metastatic disease in the abdomen or pelvis.There was stable left adrenal adenoma.  Chest CT on 12/15/2019 revealed an enlarging right lower lobe mass c/w malignancy (3.0 x 2.4 cm to 3.3 x 2.7 cm).  There was slight enlargement in a spiculated right upper lobe subpleural nodule (0.8 x 1.5 cm to 1.2 x 0.7 cm).  He has cervicalgia.Cervical spine MRIon 08/14/2019 showed no evidence of metastatic disease within the cervical spine. Heis followedby Dr YIzora Ribas  He has chemotherapy induced anemia.  He received Retacrit on 11/01/2019 (last 12/09/2019).  Hehad transient renal insufficiencyon 03/23/2019. Creatinine was 1.32 (baseline 0.61-1.0). Renal ultrasoundon 03/24/2019 revealed no acute abnormality identified.There was no hydronephrosis or bladder distention.Creatinine is 0.82 today.  Stool waspositive for C difficile + diarrheaon 06/14/2019.Hewas treated with oral vancomycin.  He received the influenza vaccineon 05/16/2019.   He received both doses of the COVID-19 vaccine (last dose 1 month ago).  Symptomatically, he feels depressed.  He denies any increased shortness of breath or pain.  Plan: 1.  Labs today: CBC with diff, CMP, TSH.  2. Metastatic high-grade adenocarcinoma the RIGHT lung He is s/p 11 cycles of pembrolizumab. He is s/p 1 cycle of carboplatin, Almita, and pembrolizumab (02/08/2019). He is s/p 1 cycle of carboplatin (AUC 2), Alimta, and pembrolizumab (04/18/2019). He is  s/p11cycles of Alimta and pembrolizumab (02/28/2019 - 03/29/2019; 05/09/2019-11/08/2019). Restaging CT scans on 02/26/2021revealed stable disease. Discuss continued maintenance treatment. Chest, abdomen and pelvic CT scan on 12/15/2019 was personally reviewed.  Agree with radiology interpretation.    There was a slight increase (2-3 mm) in the right lower lobe mass as well as a spiculated right upper lobe mass. Discuss patient's thoughts about therapy.  He is considering discontinuation of care.  We discussed continuation of current therapy given the very slight increase in size of the 2 lesions.  He is tolerating treatment extremely well.  We discussed evaluation and management of his depression.  I discussed the conference with his good friend [name] to help with the future direction of therapy. Labs reviewed.  Begin cycle #13 Alimta and pembrolizumab. Patient took his premedication yesterday. Continue B12 every 9 weeks (last05/10/2019). Continue folic acid.  Discussed the use of growth factor support secondary to interim counts.  Pre-Auth Fulphila.    Discuss use of Granix with this cycle.   Discussed symptom management.  He has antiemetics and pain medications at home to use on a as needed basis.  Interventions are adequate.  3. Bone metastasis Symptomatically, he continues to do well Last Xgeva on04/13/2021. Continue monthly Xgeva. 4. Cancer related pain  Pain due to malignancy is well controlled. Continue current management. 5.Anxiety Continue low-dose Xanax.  Assist with patient seeing psychiatry, 6.Pulmonary embolism Patient remains on long-term anticoagulation (Eliquis).  Ensure platelets are greater than 50,000. 7.Chemotherapy induced anemia Hematocrit30.5. Hemoglobin9.5. MCV94.7  today. Ferritin773with an iron saturation of 14% and a TIBC of349on 09/20/2019. Patient receives Retacrit per protocol. 8.   Cycle #13 Alimta and pembrolizumab today. 9.   RTC on 06/11 for labs (CBC with diff) and +/- Granix and +/- Retacrit 10.   RTC on 06/14 for MD conference with patient and Christina,  abs (CBC with diff) and +/- Granix. 11.   RTC in 3 weeks for MD assessment, labs (CBC with diff, CMP, Mg), and Cycle #14 Alimta and pembrolizumab, and +/- Xgeva.  I discussed the assessment and treatment plan with the patient.  The patient was provided an opportunity to ask questions and all were answered.  The patient agreed with the plan and demonstrated an understanding of the instructions.  The patient was advised to call back if the symptoms worsen or if the condition fails to improve as anticipated.  I provided 24 minutes (10:06 AM - 10:30 AM) of face-to-face time during this this encounter and > 50% was spent counseling as documented under my assessment and plan. An additional 10 minutes were spent reviewing his chart (Epic and Care Everywhere) including notes, labs, and imaging studies.   I also spoke with [name] regarding his depression and psychiatry appointment.   Lequita Asal, MD, PhD    12/21/2019, 10:06 AM  I, Heywood Footman, am acting as Education administrator for [employer]. Mike Gip, MD, PhD.  I, Anders Hohmann C. Mike Gip, MD, have reviewed the above documentation for accuracy and completeness, and I agree with the above.

## 2019-12-21 ENCOUNTER — Inpatient Hospital Stay: Payer: Medicare Other

## 2019-12-21 ENCOUNTER — Other Ambulatory Visit: Payer: Self-pay | Admitting: *Deleted

## 2019-12-21 ENCOUNTER — Telehealth: Payer: Self-pay

## 2019-12-21 ENCOUNTER — Other Ambulatory Visit: Payer: Self-pay

## 2019-12-21 ENCOUNTER — Encounter: Payer: Self-pay | Admitting: Hematology and Oncology

## 2019-12-21 ENCOUNTER — Inpatient Hospital Stay (HOSPITAL_BASED_OUTPATIENT_CLINIC_OR_DEPARTMENT_OTHER): Payer: Medicare Other | Admitting: Hematology and Oncology

## 2019-12-21 ENCOUNTER — Inpatient Hospital Stay: Payer: Medicare Other | Attending: Hematology and Oncology

## 2019-12-21 VITALS — BP 119/74 | HR 80 | Temp 97.9°F | Resp 16 | Wt 123.8 lb

## 2019-12-21 DIAGNOSIS — I2782 Chronic pulmonary embolism: Secondary | ICD-10-CM

## 2019-12-21 DIAGNOSIS — Z7189 Other specified counseling: Secondary | ICD-10-CM

## 2019-12-21 DIAGNOSIS — Z7901 Long term (current) use of anticoagulants: Secondary | ICD-10-CM | POA: Insufficient documentation

## 2019-12-21 DIAGNOSIS — Z86718 Personal history of other venous thrombosis and embolism: Secondary | ICD-10-CM | POA: Diagnosis not present

## 2019-12-21 DIAGNOSIS — D631 Anemia in chronic kidney disease: Secondary | ICD-10-CM | POA: Diagnosis not present

## 2019-12-21 DIAGNOSIS — F329 Major depressive disorder, single episode, unspecified: Secondary | ICD-10-CM

## 2019-12-21 DIAGNOSIS — C3431 Malignant neoplasm of lower lobe, right bronchus or lung: Secondary | ICD-10-CM | POA: Insufficient documentation

## 2019-12-21 DIAGNOSIS — F419 Anxiety disorder, unspecified: Secondary | ICD-10-CM | POA: Insufficient documentation

## 2019-12-21 DIAGNOSIS — Z5111 Encounter for antineoplastic chemotherapy: Secondary | ICD-10-CM | POA: Diagnosis not present

## 2019-12-21 DIAGNOSIS — Z5112 Encounter for antineoplastic immunotherapy: Secondary | ICD-10-CM

## 2019-12-21 DIAGNOSIS — G893 Neoplasm related pain (acute) (chronic): Secondary | ICD-10-CM

## 2019-12-21 DIAGNOSIS — E538 Deficiency of other specified B group vitamins: Secondary | ICD-10-CM | POA: Diagnosis not present

## 2019-12-21 DIAGNOSIS — D6481 Anemia due to antineoplastic chemotherapy: Secondary | ICD-10-CM | POA: Diagnosis present

## 2019-12-21 DIAGNOSIS — Z79899 Other long term (current) drug therapy: Secondary | ICD-10-CM | POA: Diagnosis not present

## 2019-12-21 DIAGNOSIS — T451X5A Adverse effect of antineoplastic and immunosuppressive drugs, initial encounter: Secondary | ICD-10-CM | POA: Diagnosis not present

## 2019-12-21 DIAGNOSIS — D72819 Decreased white blood cell count, unspecified: Secondary | ICD-10-CM | POA: Diagnosis not present

## 2019-12-21 DIAGNOSIS — F32A Depression, unspecified: Secondary | ICD-10-CM

## 2019-12-21 DIAGNOSIS — C7951 Secondary malignant neoplasm of bone: Secondary | ICD-10-CM | POA: Diagnosis not present

## 2019-12-21 LAB — COMPREHENSIVE METABOLIC PANEL
ALT: 10 U/L (ref 0–44)
AST: 20 U/L (ref 15–41)
Albumin: 3.3 g/dL — ABNORMAL LOW (ref 3.5–5.0)
Alkaline Phosphatase: 62 U/L (ref 38–126)
Anion gap: 8 (ref 5–15)
BUN: 11 mg/dL (ref 8–23)
CO2: 24 mmol/L (ref 22–32)
Calcium: 8.4 mg/dL — ABNORMAL LOW (ref 8.9–10.3)
Chloride: 105 mmol/L (ref 98–111)
Creatinine, Ser: 1.15 mg/dL (ref 0.61–1.24)
GFR calc Af Amer: >60 mL/min (ref >60)
GFR calc non Af Amer: >60 mL/min (ref >60)
Glucose, Bld: 98 mg/dL (ref 70–99)
Potassium: 3.8 mmol/L (ref 3.5–5.1)
Sodium: 137 mmol/L (ref 135–145)
Total Bilirubin: 0.5 mg/dL (ref 0.3–1.2)
Total Protein: 6.8 g/dL (ref 6.5–8.1)

## 2019-12-21 LAB — CBC WITH DIFFERENTIAL/PLATELET
Abs Immature Granulocytes: 0.03 10*3/uL (ref 0.00–0.07)
Basophils Absolute: 0.1 10*3/uL (ref 0.0–0.1)
Basophils Relative: 1 %
Eosinophils Absolute: 0.3 10*3/uL (ref 0.0–0.5)
Eosinophils Relative: 5 %
HCT: 30.5 % — ABNORMAL LOW (ref 39.0–52.0)
Hemoglobin: 9.5 g/dL — ABNORMAL LOW (ref 13.0–17.0)
Immature Granulocytes: 0 %
Lymphocytes Relative: 14 %
Lymphs Abs: 1 10*3/uL (ref 0.7–4.0)
MCH: 29.5 pg (ref 26.0–34.0)
MCHC: 31.1 g/dL (ref 30.0–36.0)
MCV: 94.7 fL (ref 80.0–100.0)
Monocytes Absolute: 0.6 10*3/uL (ref 0.1–1.0)
Monocytes Relative: 8 %
Neutro Abs: 5 10*3/uL (ref 1.7–7.7)
Neutrophils Relative %: 72 %
Platelets: 338 10*3/uL (ref 150–400)
RBC: 3.22 MIL/uL — ABNORMAL LOW (ref 4.22–5.81)
RDW: 17.9 % — ABNORMAL HIGH (ref 11.5–15.5)
WBC: 7 10*3/uL (ref 4.0–10.5)
nRBC: 0 % (ref 0.0–0.2)

## 2019-12-21 LAB — MAGNESIUM: Magnesium: 2.1 mg/dL (ref 1.7–2.4)

## 2019-12-21 LAB — TSH: TSH: 3.509 u[IU]/mL (ref 0.350–4.500)

## 2019-12-21 MED ORDER — SODIUM CHLORIDE 0.9 % IV SOLN
400.0000 mg/m2 | Freq: Once | INTRAVENOUS | Status: AC
  Administered 2019-12-21: 700 mg via INTRAVENOUS
  Filled 2019-12-21: qty 20

## 2019-12-21 MED ORDER — SODIUM CHLORIDE 0.9% FLUSH
10.0000 mL | Freq: Once | INTRAVENOUS | Status: AC
  Administered 2019-12-21: 10 mL via INTRAVENOUS
  Filled 2019-12-21: qty 10

## 2019-12-21 MED ORDER — SODIUM CHLORIDE 0.9 % IV SOLN
Freq: Once | INTRAVENOUS | Status: AC
  Filled 2019-12-21: qty 250

## 2019-12-21 MED ORDER — HEPARIN SOD (PORK) LOCK FLUSH 100 UNIT/ML IV SOLN
INTRAVENOUS | Status: AC
  Filled 2019-12-21: qty 5

## 2019-12-21 MED ORDER — PALONOSETRON HCL INJECTION 0.25 MG/5ML
0.2500 mg | Freq: Once | INTRAVENOUS | Status: AC
  Administered 2019-12-21: 0.25 mg via INTRAVENOUS
  Filled 2019-12-21: qty 5

## 2019-12-21 MED ORDER — SODIUM CHLORIDE 0.9 % IV SOLN
200.0000 mg | Freq: Once | INTRAVENOUS | Status: AC
  Administered 2019-12-21: 200 mg via INTRAVENOUS
  Filled 2019-12-21: qty 8

## 2019-12-21 MED ORDER — SODIUM CHLORIDE 0.9 % IV SOLN
400.0000 mg/m2 | Freq: Once | INTRAVENOUS | Status: DC
  Filled 2019-12-21: qty 28

## 2019-12-21 MED ORDER — HEPARIN SOD (PORK) LOCK FLUSH 100 UNIT/ML IV SOLN
500.0000 [IU] | Freq: Once | INTRAVENOUS | Status: AC
  Administered 2019-12-21: 500 [IU] via INTRAVENOUS
  Filled 2019-12-21: qty 5

## 2019-12-21 MED ORDER — FOLIC ACID 1 MG PO TABS: 1.0000 mg | ORAL_TABLET | Freq: Every day | ORAL | 3 refills | Status: DC

## 2019-12-21 NOTE — Progress Notes (Signed)
Patient here for oncology follow-up appointment, expresses no complaints or concerns at this time. Ask for folic acid refill. Back pain is better.

## 2019-12-21 NOTE — Telephone Encounter (Signed)
[name] was contacted and asked to accompany patient to next appointment per Dr Mike Gip request. She is the patients support person. [name] is agreeable.

## 2019-12-23 ENCOUNTER — Other Ambulatory Visit: Payer: Self-pay | Admitting: Hematology and Oncology

## 2019-12-26 ENCOUNTER — Inpatient Hospital Stay (HOSPITAL_BASED_OUTPATIENT_CLINIC_OR_DEPARTMENT_OTHER): Payer: Medicare Other | Admitting: Hospice and Palliative Medicine

## 2019-12-26 DIAGNOSIS — Z515 Encounter for palliative care: Secondary | ICD-10-CM

## 2019-12-26 NOTE — Progress Notes (Signed)
Was unable to reach patient for virtual visit/telephone call.  Voicemail left.  Will reschedule.

## 2019-12-28 ENCOUNTER — Other Ambulatory Visit: Payer: Self-pay | Admitting: *Deleted

## 2019-12-28 DIAGNOSIS — F419 Anxiety disorder, unspecified: Secondary | ICD-10-CM

## 2019-12-28 DIAGNOSIS — G893 Neoplasm related pain (acute) (chronic): Secondary | ICD-10-CM

## 2019-12-28 MED ORDER — ALPRAZOLAM 0.5 MG PO TABS: 0.5000 mg | ORAL_TABLET | Freq: Four times a day (QID) | ORAL | 0 refills | Status: DC | PRN

## 2019-12-28 MED ORDER — OXYCODONE HCL 20 MG PO TABS: ORAL_TABLET | ORAL | 0 refills | Status: DC

## 2019-12-30 ENCOUNTER — Inpatient Hospital Stay: Payer: Medicare Other

## 2019-12-30 NOTE — Progress Notes (Signed)
[facility]  [address] [place], [place] [address] Phone: [phone]  Fax: [phone]   Clinic Day:  01/02/2020  Referring physician: [name], NP  Chief Complaint: Jimmy West is a 69 y.o. male with  metastatic adenocarcinoma of the lung who is seen for assessment on day 13 of cycle #13 Alimta and pembrolizumab.  HPI: The patient was last seen in the medical oncology clinic on 12/21/2019. At that time, he felt "alright".  He had ongoing issues with depression; He denied any suicidal ideation. He had an appointment with psychiatry on 04/24/2020.Chest CT on 12/15/2019 revealed slight enlargement in the RLL mass(3-4 mm) and RUL subpleural nodule (3 x 1 mm).  We discussed consideration of change in therapy.  He was considering discontinuing treatment.  Hematocrit was 30.5, hemoglobin 9.5, platelets 338,000, WBC 7,000.  Calcium was 8.4. Albumin 3.3. TSH was 3.509.  Given his excellent tolerance of Alimta and pembrolizumab, he agreed to cycle #13.  We discussed growth factor support given his nadir ANC of 500 with cycle #12.  We discussed a caregiver conference today.   He did not return for follow-up counts and possible Retacrit and Granix on 12/30/2019.  During the interim, he has felt about the same. He has a runny nose related to allergies. He has some shortness of breath on exertion. His cough is better.  He missed Friday's lab possible Retacrit injection due to not feeling well. He felt very fatigued and weak. He has a poor appetite. He drinks 1 Ensure per day and nibbles on a small amount of food. He is drinking water. He stated that he feels depressed due to not working, ongoing weakness, and no improvement in sight (stage IV disease).  We discussed chemotherapy holidays.  Patient agreed to continue treatment.    Past Medical History:  Diagnosis Date  . Bulging lumbar disc   . Cancer ([facility])    stage 4 lung cancer  . Heart attack  [facility])     Past Surgical History:  Procedure Laterality Date  . CARDIAC CATHETERIZATION     two stents  . KNEE SURGERY Left   . PORTA CATH INSERTION N/A 05/21/2018   Procedure: PORTA CATH INSERTION;  Surgeon: Algernon Huxley, MD;  Location: [facility];  Service: Cardiovascular;  Laterality: N/A;    Family History  Problem Relation Age of Onset  . Heart failure Father   . Cancer Maternal Aunt   . Heart failure Maternal Uncle   . Dementia Paternal Grandmother   . Cancer Maternal Aunt     Social History:  reports that he quit smoking about 16 years ago. His smoking use included cigarettes. He has a 22.50 pack-year smoking history. He has never used smokeless tobacco. He reports previous alcohol use. He reports that he does not use drugs. He started smoking at age 45. He is smoking 1 1/2 packs/day. Patient denies known exposures to radiation ortoxins. 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 He has not been working recently as the salon was closed. He plays golf occasionally (none recently).He has 8 cats. The patient is accompanied by Jimmy West on the phone today.  Allergies: No Known Allergies  Current Medications: Current Outpatient Medications  Medication Sig Dispense Refill  . acetaminophen (TYLENOL) 500 MG tablet Take 500 mg by mouth 3 (three) times daily as needed.    Marland Kitchen albuterol (VENTOLIN HFA) 108 (90 Base) MCG/ACT inhaler Inhale 2 puffs into the  lungs every 6 (six) hours as needed for wheezing or shortness of breath. 18 g 3  . ALPRAZolam (XANAX) 0.5 MG tablet Take 1 tablet (0.5 mg total) by mouth 4 (four) times daily as needed for anxiety. 60 tablet 0  . Calcium 600-400 MG-UNIT CHEW Chew 2 tablets by mouth daily.    Marland Kitchen dexamethasone (DECADRON) 4 MG tablet Take 1 tab two times a day the day before Alimta chemo, then take 2 tabs once a day for 3 days starting the day after chemo. 30 tablet 1  . ELIQUIS 5 MG TABS tablet TAKE 1 TABLET BY MOUTH TWICE  DAILY 60 tablet 5  . FLUoxetine (PROZAC) 40 MG capsule Take 1 capsule (40 mg total) by mouth daily. 30 capsule 3  . folic acid (FOLVITE) 1 MG tablet Take 1 tablet (1 mg total) by mouth daily. Continue until 21 days after Alimta completed. 100 tablet 3  . Oxycodone HCl 20 MG TABS TAKE 1/2 TO 1 TABLET BY MOUTH EVERY 4 HOURS AS NEEDED FOR SEVERE PAIN 60 tablet 0  . polyethylene glycol (MIRALAX / GLYCOLAX) packet Take 17 g by mouth daily.    Marland Kitchen senna (SENOKOT) 8.6 MG tablet Take 1 tablet by mouth as needed.     Marland Kitchen ibuprofen (ADVIL,MOTRIN) 200 MG tablet Take 200 mg by mouth every 6 (six) hours as needed. (Patient not taking: Reported on 01/02/2020)    . ondansetron (ZOFRAN ODT) 8 MG disintegrating tablet Take 1 tablet (8 mg total) by mouth every 8 (eight) hours as needed for nausea or vomiting. (Patient not taking: Reported on 12/21/2019) 20 tablet 1   No current facility-administered medications for this visit.   Facility-Administered Medications Ordered in Other Visits  Medication Dose Route Frequency Provider Last Rate Last Admin  . 0.9 %  sodium chloride infusion   Intravenous Once Jimmy Fessenden C, MD      . 0.9 %  sodium chloride infusion   Intravenous Once PRN Jimmy Ransom C, MD      . albuterol (PROVENTIL) (2.5 MG/3ML) 0.083% nebulizer solution 2.5 mg  2.5 mg Nebulization Once PRN Jimmy Henly C, MD      . alteplase (CATHFLO ACTIVASE) injection 2 mg  2 mg Intracatheter Once PRN Jimmy Asal, MD      . EPINEPHrine (ADRENALIN) 1 MG/10ML injection 0.25 mg  0.25 mg Intravenous Once PRN Jimmy Talavera C, MD      . EPINEPHrine (ADRENALIN) 1 MG/10ML injection 0.25 mg  0.25 mg Intravenous Once PRN Jimmy Julio C, MD      . heparin lock flush 100 unit/mL  500 Units Intracatheter Once PRN Jimmy Stalls C, MD      . heparin lock flush 100 unit/mL  250 Units Intracatheter Once PRN Jimmy Stalls C, MD      . heparin lock flush 100 unit/mL  500 Units Intravenous Once  Jimmy Selbe C, MD      . sodium chloride flush (NS) 0.9 % injection 10 mL  10 mL Intravenous PRN Jimmy Stalls C, MD   10 mL at 03/04/19 0959  . sodium chloride flush (NS) 0.9 % injection 10 mL  10 mL Intracatheter Once PRN Jimmy Stalls C, MD      . sodium chloride flush (NS) 0.9 % injection 10 mL  10 mL Intravenous PRN Jimmy Asal, MD   10 mL at 01/02/20 0838  . sodium chloride flush (NS) 0.9 % injection 3 mL  3 mL Intracatheter Once PRN Jimmy Orne C,  MD        Review of Systems  Constitutional: Positive for malaise/fatigue. Negative for chills, diaphoresis, fever and weight loss (up 3 lbs).       Feels "alright".   HENT: Negative for congestion and sore throat.        Rhinorrhea due to allergies.   Eyes: Negative for blurred vision and double vision.  Respiratory: Positive for shortness of breath (minimal exertion). Negative for cough (chronic; better).   Cardiovascular: Negative for chest pain, palpitations and leg swelling.  Gastrointestinal: Negative for abdominal pain, blood in stool, constipation, diarrhea, heartburn, melena, nausea and vomiting.       Poor appetite. Drinking 1 Ensure per day.  Genitourinary: Negative for dysuria, frequency and urgency.  Musculoskeletal: Negative for back pain, falls, myalgias and neck pain (better).  Skin: Negative for rash.  Neurological: Positive for weakness. Negative for dizziness, sensory change, focal weakness and headaches.  Endo/Heme/Allergies: Positive for environmental allergies. Negative for polydipsia. Does not bruise/bleed easily.  Psychiatric/Behavioral: Positive for depression. The patient is nervous/anxious (on Xanax). The patient does not have insomnia (on OTC medication).   All other systems reviewed and are negative.  Performance status (ECOG): 1-2  Vitals Blood pressure 122/83, pulse (!) 109, temperature (!) 96.6 F (35.9 West), temperature source Tympanic, weight 126 lb 12.2 oz (57.5 kg), SpO2 97 %.    Physical Exam Constitutional:      General: He is not in acute distress.    Appearance: Normal appearance. He is well-developed. He is not diaphoretic.     Interventions: Face mask in place.     Comments: Thin brown graying hair. Mustache. Dentures  HENT:     Head: Normocephalic and atraumatic.     Right Ear: Hearing normal.     Mouth/Throat:     Mouth: No oral lesions.  Eyes:     General: No scleral icterus.       Right eye: No discharge.        Left eye: No discharge.     Conjunctiva/sclera: Conjunctivae normal.     Pupils: Pupils are equal, round, and reactive to light.     Comments: Grey/blue eyes. Glasses.   Neck:     Vascular: No JVD.  Cardiovascular:     Rate and Rhythm: Normal rate and regular rhythm.     Heart sounds: Normal heart sounds. No murmur heard.  No friction rub. No gallop.   Pulmonary:     Effort: Pulmonary effort is normal. No respiratory distress.     Breath sounds: Normal breath sounds. No wheezing, rhonchi or rales.  Chest:     Chest wall: No tenderness.  Abdominal:     General: Bowel sounds are normal. There is no distension.     Palpations: Abdomen is soft. There is no mass.     Tenderness: There is no abdominal tenderness. There is no guarding or rebound.  Musculoskeletal:        General: No tenderness. Normal range of motion.     Cervical back: Normal range of motion and neck supple.  Lymphadenopathy:     Head:     Right side of head: No preauricular, posterior auricular or occipital adenopathy.     Left side of head: No preauricular, posterior auricular or occipital adenopathy.     Cervical: No cervical adenopathy.     Upper Body:     Right upper body: No supraclavicular adenopathy.     Left upper body: No supraclavicular adenopathy.  Lower Body: No right inguinal adenopathy. No left inguinal adenopathy.  Skin:    General: Skin is warm and dry.     Coloration: Skin is not pale.     Findings: No bruising, erythema, lesion or rash.    Neurological:     Mental Status: He is alert and oriented to person, place, and time.  Psychiatric:        Behavior: Behavior normal.        Thought Content: Thought content normal.        Judgment: Judgment normal.    Imaging studies: 04/30/2018:  PET scanrevealed a 3.4 cm hypermetabolic RLL pulmonary mass (SUV 13.2), 11 mm pulmonary nodule in the LEFT lung (SUV 4.5), RIGHT paratracheal lymph node (SUV 5.1), and hypermetabolic activity within the T4 vertebral body (SUV 10.2).  05/03/2018:  Thoracic spine MRIon 05/03/2018 revealed T4 metastasiswith a 40% pathologic compression deformity and 5 mm of retropulsion of the vertebral body. Retropulsion results in mild spinal canal stenosis and mild bilateral C4-5 foraminal stenosis. There was paravertebral soft tissue thickening from mid T3 to mid T5, likely representing edema related to the pathologic compression deformity vs. possible extraosseous extension of the neoplasm. There were no additional thoracic spinal metastases noted.  05/03/2018:  Head MRIrevealed no intracranial metastatic disease. 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 Incidental mention made of mild paranasal sinus disease. 01/10/2019:  Cervical and thoracic spine CT at [facility] unchanged severe compression deformity/vertebral plana of T4 with a stable degree of retropulsion and focal mild spinal canal stenosis. There was interval enlargement of a right lower lobe pulmonary mass(4.5 x 3.5 cm compared to 3.9 x 2.9 cm)with redemonstrated spiculated pulmonary nodules. 01/28/2019:  Chest, abdomen, pelvisCTrevealed slight interval enlargement of a right lower lobe mass with central necrosis measuring 4.6 x 3.4 cm, previously 4.0 x 3.0 cm. There was no change in right upper lobe nodules(1.4 and 0.8  cm).Therewas almost no residua of left lung nodules, with irregular opacities in the apical left upper lobeandsuperior segment left lower lobe. There was no change in right hilar soft tissue and lymph nodes.There wasvertebra plana deformity of T4andno evidence of new osseous metastatic disease.There was no evidence of distant metastatic disease in the abdomen or pelvis.The1.6 x 1.1 cmleft adrenal nodule(non-metabolic on prior PET scan),was unchanged. 03/24/2019:  Renal ultrasoundrevealed no acute abnormality identified.There was no hydronephrosis or bladder distention. 04/23/2019:  Abdomen and pelvis CT revealed no acute abdominal or pelvic pathology. There was fluid in the colon which can be seen with diarrhea. There was diverticulosis without evidence of diverticulitis. There was a stable 1.5 cm left adrenal nodule. 05/06/2019:  Chest, abdomen, and pelvisCTrevealed a positive response to interval therapy for the known right lower lobe lung carcinoma(4.6 x 3.4 cm to3.1 x 2.8 cm transversely). There hadbeen a mild interval decrease contiguous soft tissue that extends along the right hilum. The two right upper lobe noduleswerestable from the most recent prior study (smaller than 04/27/2018).There are no new lung nodules.Therewasno evidence of new metastatic disease.There was stable severe compression deformity/vertebra plana of T4.Therewasno evidence of other osseous metastatic disease.There was a stable left adrenal nodule consistent with an adenoma. 05/30/2019:  ChestCT angiogramrevealed a tiny subsegmental pulmonary embolusin the right lower lobe. There areas of ground-glass at the periphery of the right chest hadbecome more conspicuous. This mayrelate to mild pneumonitis, perhaps due to  radiation, attention on follow-up.The dominant nodule in the right lung basewasstable in size. Heis onEliquis. 08/14/2019:  Cervical spine MRIrevealed no evidence of  metastatic disease within the cervical spine. 09/16/2019:  Chest, abdomen and pelvis CTrevealed asimilar-appearing spiculated right lower lobe nodule.There was no evidence for metastatic disease in the abdomen or pelvis.There was stable left adrenal adenoma. 12/15/2019:  Chest CT revealed an enlarging right lower lobe mass West/w malignancy (3.0 x 2.4 cm to 3.3 x 2.7 cm).  There was slight enlargement in a spiculated right upper lobe subpleural nodule (0.8 x 1.5 cm to 1.2 x 0.7 cm).   Infusion on 01/02/2020  Component Date Value Ref Range Status  . WBC 01/02/2020 2.3* 4.0 - 10.5 K/uL Final  . RBC 01/02/2020 2.96* 4.22 - 5.81 MIL/uL Final  . Hemoglobin 01/02/2020 8.6* 13.0 - 17.0 g/dL Final  . HCT 01/02/2020 26.8* 39 - 52 % Final  . MCV 01/02/2020 90.5  80.0 - 100.0 fL Final  . MCH 01/02/2020 29.1  26.0 - 34.0 pg Final  . MCHC 01/02/2020 32.1  30.0 - 36.0 g/dL Final  . RDW 01/02/2020 16.3* 11.5 - 15.5 % Final  . Platelets 01/02/2020 207  150 - 400 K/uL Final  . nRBC 01/02/2020 0.0  0.0 - 0.2 % Final   Performed at [facility], [address], [place], [place] [address]  . Neutrophils Relative % 01/02/2020 PENDING  % Incomplete  . Neutro Abs 01/02/2020 PENDING  1.7 - 7.7 K/uL Incomplete  . Band Neutrophils 01/02/2020 PENDING  % Incomplete  . Lymphocytes Relative 01/02/2020 PENDING  % Incomplete  . Lymphs Abs 01/02/2020 PENDING  0.7 - 4.0 K/uL Incomplete  . Monocytes Relative 01/02/2020 PENDING  % Incomplete  . Monocytes Absolute 01/02/2020 PENDING  0 - 1 K/uL Incomplete  . Eosinophils Relative 01/02/2020 PENDING  % Incomplete  . Eosinophils Absolute 01/02/2020 PENDING  0 - 0 K/uL Incomplete  . Basophils Relative 01/02/2020 PENDING  % Incomplete  . Basophils Absolute 01/02/2020 PENDING  0 - 0 K/uL Incomplete  . WBC Morphology 01/02/2020 PENDING   Incomplete  . RBC Morphology 01/02/2020 PENDING   Incomplete  . Smear Review 01/02/2020 PENDING   Incomplete  . Other 01/02/2020  PENDING  % Incomplete  . nRBC 01/02/2020 PENDING  0 /100 WBC Incomplete  . Metamyelocytes Relative 01/02/2020 PENDING  % Incomplete  . Myelocytes 01/02/2020 PENDING  % Incomplete  . Promyelocytes Relative 01/02/2020 PENDING  % Incomplete  . Blasts 01/02/2020 PENDING  % Incomplete  . Immature Granulocytes 01/02/2020 PENDING  % Incomplete  . Abs Immature Granulocytes 01/02/2020 PENDING  0.00 - 0.07 K/uL Incomplete  . Sodium 01/02/2020 139  135 - 145 mmol/L Final  . Potassium 01/02/2020 3.4* 3.5 - 5.1 mmol/L Final  . Chloride 01/02/2020 101  98 - 111 mmol/L Final  . CO2 01/02/2020 26  22 - 32 mmol/L Final  . Glucose, Bld 01/02/2020 119* 70 - 99 mg/dL Final   Glucose reference range applies only to samples taken after fasting for at least 8 hours.  . BUN 01/02/2020 10  8 - 23 mg/dL Final  . Creatinine, Ser 01/02/2020 1.08  0.61 - 1.24 mg/dL Final  . Calcium 01/02/2020 9.4  8.9 - 10.3 mg/dL Final  . GFR calc non Af Amer 01/02/2020 >60  >60 mL/min Final  . GFR calc Af Amer 01/02/2020 >60  >60 mL/min Final  . Anion gap 01/02/2020 12  5 - 15 Final   Performed at [facility], [address] [place], [place] [address]    Assessment:  Essie Gehret is a 69 y.o. male with metastatichigh-grade adenocarcinomaof the right lungs/p CT-guided biopsy of a RLL lung mass on 05/11/2018. Pathologyrevealed an invasive high-grade adenocarcinoma, with predominantly solid growth pattern. The neoplastic cells were TTF-1 (+), Napsin A (+), and P40 (+). He has a T4 vertebral metastasis. Clinical stage is T4N1M1.  There was not enough material for Foundation One testing. PDL-1revealed TPS 90%.  PET scanon 04/30/2018 revealed a 3.4 cm hypermetabolic RLL pulmonary mass (SUV 13.2), 11 mm pulmonary nodule in the LEFT lung (SUV 4.5), RIGHT paratracheal lymph node (SUV 5.1), and hypermetabolic activity within the T4 vertebral body (SUV 10.2).   Thoracic spine MRIon 05/03/2018 revealed T4  metastasiswith a 40% pathologic compression deformity and 5 mm of retropulsion of the vertebral body. Retropulsion results in mild spinal canal stenosis and mild bilateral C4-5 foraminal stenosis. There was paravertebral soft tissue thickening from mid T3 to mid T5, likely representing edema related to the pathologic compression deformity vs. possible extraosseous extension of the neoplasm. There were no additional thoracic spinal metastases noted.   Head MRIon 05/03/2018 revealed no intracranial metastatic disease. 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 Incidental mention made of mild paranasal sinus disease.  Hereceived 11cycles ofpembrolizumab(05/24/2018 - 02/07/2019). He toleratedtreatment well. CEAwas 1.3 on 08/30/2018.LDHwas 165 on 11/29/2018.  He completed T4 radiationon 07/07/2018. He receives Xgevamonthly (06/03/2018 -07/18/2019).  He received 1 cycle of carboplatin, Alimta, and pembrolizumabon 02/08/2019. Cycle #1 was complicated by nausea, vomiting, and diarrhea necessitating hospitalization.Decision made to hold carboplatin with his second dose.He iss/p2cycles ofAlimta and pembrolizumab (02/28/2019 - 03/29/2019).Cycle #2 was complicated by nausea, vomiting, and dehydration requring fluids in clinic and an overnight stay in the hospital. Hereceivedcarboplatin (AUC 2), Alimta, and pebrolizumab(04/18/2019).He tolerated it poorly.   Hehasreceived13 cycles ofAlimta and pembrolizumabon (05/09/2019 -12/21/2019). He tolerated well. He receives B12every 9 weeks (last05/10/2019).  He hascancer-related painin T4. He is off the [employer].  He is taking oxycodone10-28m every 4 hours prn.  Chest, abdomen and pelvis CTon 09/16/2019 revealed asimilar-appearing  spiculated right lower lobe nodule.There was no evidence for metastatic disease in the abdomen or pelvis.There was stable left adrenal adenoma.  Chest CT on 12/15/2019 revealed an enlarging right lower lobe mass West/w malignancy (3.0 x 2.4 cm to 3.3 x 2.7 cm).  There was slight enlargement in a spiculated right upper lobe subpleural nodule (0.8 x 1.5 cm to 1.2 x 0.7 cm).  He has cervicalgia.Cervical spine MRIon 08/14/2019 showed no evidence of metastatic disease within the cervical spine. Heis followedby Dr YIzora Ribas  He has chemotherapy induced anemia.  He received Retacrit on 11/01/2019 (last 12/09/2019).  Hehad transient renal insufficiencyon 03/23/2019. Creatinine was 1.32 (baseline 0.61-1.0). Renal ultrasoundon 03/24/2019 revealed no acute abnormality identified.There was no hydronephrosis or bladder distention.Creatinine is 0.82 today.  Stool waspositive for West difficile + diarrheaon 06/14/2019.Hewas treated with oral vancomycin.  He received the influenza vaccineon 05/16/2019.   Symptomatically, he notes chronic fatigue and shortness of breath on exertion.  Appetite is modest.  His mood is unchanged.    Plan: 1.   Labs today: CBC with diff. 2. Metastatic high-grade adenocarcinoma the RIGHT lung He is s/p 11 cycles of pembrolizumab. He is s/p 1 cycle of carboplatin, Almita, and pembrolizumab (02/08/2019). He is s/p 1 cycle of carboplatin (AUC 2),  Alimta, and pembrolizumab (04/18/2019). He is s/p13cycles of Alimta and pembrolizumab (02/28/2019 - 03/29/2019; 05/09/2019-12/21/2019). Restaging CT scans on 05/27/2021revealed slight growth (3-4 mm). Reviewed conversation at last visit regarding patient's tolerance of therapy.  He would like to continue treatment. Continue B12 every 9 weeks (last05/10/2019; due 01/24/2020). Continue folic acid. Discuss symptom management.  He has antiemetics and pain medications at home to use on a prn  bases.  Interventions are adequate.     3. Bone metastasis Symptomatically, his bone pain is well controlled. Last Xgeva on04/13/2021. Anticipate restart of Xgeva this month.  Pain medications written by [name], NP 4.Anxiety and depression Patient on low dose Xanax.  Awaiting appointment with psychiatry (on call back list). 5.Pulmonary embolism Patient on Eliquis long term.  He denies any bruising or bleeding.  Ensure platelets>50,000 on treatment. 6.Chemotherapy induced anemia Hematocrit26.8. Hemoglobin8.6. MCV90.5today. Ferritin773with an iron saturation of 14% and a TIBC of349on 09/20/2019. Retacrit today. 7.   Leukopenia  WBC 2300 (ANC 400).  Etiology secondary to Alimta.  Patient taking folic acid, W86, and steroids.  Review neutropenic precautions.  Granix today. 8.   General weakness  Discuss home health services and physical therapy.  Encourage activity as tolerated. 9.   RTC on 01/11/2020 for MD assessment, labs (CBC with diff, CMP, Mg, TSH, ferritin, iron studies) and cycle #14 Alimta and pembrolizumab.  I discussed the assessment and treatment plan with the patient.  The patient was provided an opportunity to ask questions and all were answered.  The patient agreed with the plan and demonstrated an understanding of the instructions.  The patient was advised to call back if the symptoms worsen or if the condition fails to improve as anticipated.  I provided 16 minutes of face-to-face time during this this encounter and > 50% was spent counseling as documented under my assessment and plan.   Jimmy Asal, MD, PhD    01/02/2020, 9:13 AM  I, Selena Batten, am acting as scribe for [employer]. Mike Gip, MD, PhD.  I, Crue Otero West. Mike Gip, MD, have reviewed the above documentation for accuracy and completeness, and I agree with the  above.

## 2020-01-02 ENCOUNTER — Inpatient Hospital Stay (HOSPITAL_BASED_OUTPATIENT_CLINIC_OR_DEPARTMENT_OTHER): Payer: Medicare Other | Admitting: Hematology and Oncology

## 2020-01-02 ENCOUNTER — Inpatient Hospital Stay: Payer: Medicare Other

## 2020-01-02 ENCOUNTER — Other Ambulatory Visit: Payer: Self-pay

## 2020-01-02 ENCOUNTER — Encounter: Payer: Self-pay | Admitting: Hematology and Oncology

## 2020-01-02 VITALS — BP 122/83 | HR 109 | Temp 96.6°F | Wt 126.8 lb

## 2020-01-02 DIAGNOSIS — I2782 Chronic pulmonary embolism: Secondary | ICD-10-CM

## 2020-01-02 DIAGNOSIS — Z5112 Encounter for antineoplastic immunotherapy: Secondary | ICD-10-CM

## 2020-01-02 DIAGNOSIS — D6481 Anemia due to antineoplastic chemotherapy: Secondary | ICD-10-CM

## 2020-01-02 DIAGNOSIS — C3431 Malignant neoplasm of lower lobe, right bronchus or lung: Secondary | ICD-10-CM | POA: Diagnosis not present

## 2020-01-02 DIAGNOSIS — T451X5A Adverse effect of antineoplastic and immunosuppressive drugs, initial encounter: Secondary | ICD-10-CM

## 2020-01-02 DIAGNOSIS — Z5111 Encounter for antineoplastic chemotherapy: Secondary | ICD-10-CM

## 2020-01-02 DIAGNOSIS — C7951 Secondary malignant neoplasm of bone: Secondary | ICD-10-CM

## 2020-01-02 DIAGNOSIS — F329 Major depressive disorder, single episode, unspecified: Secondary | ICD-10-CM

## 2020-01-02 DIAGNOSIS — D701 Agranulocytosis secondary to cancer chemotherapy: Secondary | ICD-10-CM

## 2020-01-02 DIAGNOSIS — G893 Neoplasm related pain (acute) (chronic): Secondary | ICD-10-CM | POA: Diagnosis not present

## 2020-01-02 DIAGNOSIS — Z7189 Other specified counseling: Secondary | ICD-10-CM

## 2020-01-02 LAB — CBC WITH DIFFERENTIAL/PLATELET
Abs Immature Granulocytes: 0.01 10*3/uL (ref 0.00–0.07)
Basophils Absolute: 0 10*3/uL (ref 0.0–0.1)
Basophils Relative: 0 %
Eosinophils Absolute: 0.1 10*3/uL (ref 0.0–0.5)
Eosinophils Relative: 5 %
HCT: 26.8 % — ABNORMAL LOW (ref 39.0–52.0)
Hemoglobin: 8.6 g/dL — ABNORMAL LOW (ref 13.0–17.0)
Immature Granulocytes: 0 %
Lymphocytes Relative: 35 %
Lymphs Abs: 0.8 10*3/uL (ref 0.7–4.0)
MCH: 29.1 pg (ref 26.0–34.0)
MCHC: 32.1 g/dL (ref 30.0–36.0)
MCV: 90.5 fL (ref 80.0–100.0)
Monocytes Absolute: 1 10*3/uL (ref 0.1–1.0)
Monocytes Relative: 42 %
Neutro Abs: 0.4 10*3/uL — ABNORMAL LOW (ref 1.7–7.7)
Neutrophils Relative %: 18 %
Platelets: 207 10*3/uL (ref 150–400)
RBC: 2.96 MIL/uL — ABNORMAL LOW (ref 4.22–5.81)
RDW: 16.3 % — ABNORMAL HIGH (ref 11.5–15.5)
WBC: 2.3 10*3/uL — ABNORMAL LOW (ref 4.0–10.5)
nRBC: 0 % (ref 0.0–0.2)

## 2020-01-02 LAB — BASIC METABOLIC PANEL
Anion gap: 12 (ref 5–15)
BUN: 10 mg/dL (ref 8–23)
CO2: 26 mmol/L (ref 22–32)
Calcium: 9.4 mg/dL (ref 8.9–10.3)
Chloride: 101 mmol/L (ref 98–111)
Creatinine, Ser: 1.08 mg/dL (ref 0.61–1.24)
GFR calc Af Amer: >60 mL/min (ref >60)
GFR calc non Af Amer: >60 mL/min (ref >60)
Glucose, Bld: 119 mg/dL — ABNORMAL HIGH (ref 70–99)
Potassium: 3.4 mmol/L — ABNORMAL LOW (ref 3.5–5.1)
Sodium: 139 mmol/L (ref 135–145)

## 2020-01-02 MED ORDER — SODIUM CHLORIDE 0.9% FLUSH
10.0000 mL | INTRAVENOUS | Status: DC | PRN
  Administered 2020-01-02: 10 mL via INTRAVENOUS
  Filled 2020-01-02: qty 10

## 2020-01-02 MED ORDER — HEPARIN SOD (PORK) LOCK FLUSH 100 UNIT/ML IV SOLN
500.0000 [IU] | Freq: Once | INTRAVENOUS | Status: AC
  Administered 2020-01-02: 500 [IU] via INTRAVENOUS
  Filled 2020-01-02: qty 5

## 2020-01-02 MED ORDER — EPOETIN ALFA-EPBX 10000 UNIT/ML IJ SOLN
10000.0000 [IU] | Freq: Once | INTRAMUSCULAR | Status: AC
  Administered 2020-01-02: 10000 [IU] via SUBCUTANEOUS

## 2020-01-02 MED ORDER — FILGRASTIM-SNDZ 300 MCG/0.5ML IJ SOSY
300.0000 ug | PREFILLED_SYRINGE | Freq: Once | INTRAMUSCULAR | Status: AC
  Administered 2020-01-02: 300 ug via SUBCUTANEOUS
  Filled 2020-01-02: qty 0.5

## 2020-01-02 NOTE — Progress Notes (Signed)
Patient missed Fridays lab possible Retacrit injection appt due to not feeling well. He feels very fatigued, weak. Never hungry, he drinks 1 ensure per day and nibbles on a small amt of food. He is drinking water. States he feels depressed due to ongoing weakness and no improvement in sight.

## 2020-01-05 ENCOUNTER — Inpatient Hospital Stay: Payer: Medicare Other

## 2020-01-05 NOTE — Progress Notes (Signed)
Nutrition  Called patient for nutrition visit and no answer.  Left message on voicemail with call back number.   Message sent to scheduler's to offer another appointment and provider informed.   Emmalina Espericueta B. Zenia Resides, [place], [place] Registered Dietitian [phone] (pager)

## 2020-01-06 DIAGNOSIS — T451X5A Adverse effect of antineoplastic and immunosuppressive drugs, initial encounter: Secondary | ICD-10-CM | POA: Insufficient documentation

## 2020-01-06 DIAGNOSIS — D701 Agranulocytosis secondary to cancer chemotherapy: Secondary | ICD-10-CM | POA: Insufficient documentation

## 2020-01-09 ENCOUNTER — Other Ambulatory Visit: Payer: Self-pay | Admitting: *Deleted

## 2020-01-09 DIAGNOSIS — R531 Weakness: Secondary | ICD-10-CM

## 2020-01-09 DIAGNOSIS — C3431 Malignant neoplasm of lower lobe, right bronchus or lung: Secondary | ICD-10-CM

## 2020-01-09 DIAGNOSIS — F419 Anxiety disorder, unspecified: Secondary | ICD-10-CM

## 2020-01-09 DIAGNOSIS — R530 Neoplastic (malignant) related fatigue: Secondary | ICD-10-CM

## 2020-01-09 DIAGNOSIS — R0602 Shortness of breath: Secondary | ICD-10-CM

## 2020-01-09 NOTE — Progress Notes (Signed)
[facility]  [address] [place], [place] [address] Phone: [phone]  Fax: [phone]   Clinic Day:  01/11/2020  Referring physician: [name], NP  Chief Complaint: Jimmy [facility] is a 69 y.o. male with metastatic adenocarcinoma of the lung who is seen for assessment prior to cycle #14 Alimta and pembrolizumab.  HPI: The patient was last seen in the medical oncology clinic on 01/02/2020. At that time, he noted chronic fatigue and shortness of breath on exertion. Appetite was modest. His mood was unchanged. The patient had agreed to continue treatment.  Hematocrit was 26.8, hemoglobin 8.6, MCV 90.5, platelets 207,000, WBC 2,300 (ANC 400).  Potassium was 3.4.  We discussed neutropenic precautions.  He received Retacrit and Zarxio.  Jimmy West, RD, left a voicemail for the patient on 01/05/2020.  During the interim, he has been "okay." After he received Retacrit, he noted that his energy increased. His mood is slightly improved, but he is unsure why. He is following up with psychiatry in August. He still gets short of breath with minimal exertion. He reports some pain/tingling on his right underarm and shoulder blade. He denies any recent trauma or injury. He stopped using the Fentanyl patch a few months ago but is still taking oxycodone every 4 hours. He has not taken Ibuprofen recently.   We discussed holding treatment today due to elevated creatinine at 1.44 (CrCl 39 ml/min). The patient will focus on increasing his fluid intake. He does not feel that he needs IV fluids at this time.   Past Medical History:  Diagnosis Date  . Bulging lumbar disc   . Cancer ([place])    stage 4 lung cancer  . Heart attack [facility])     Past Surgical History:  Procedure Laterality Date  . CARDIAC CATHETERIZATION     two stents  . KNEE SURGERY Left   . PORTA CATH INSERTION N/A 05/21/2018   Procedure: PORTA CATH INSERTION;  Surgeon: Jimmy Huxley, MD;   Location: [facility];  Service: Cardiovascular;  Laterality: N/A;    Family History  Problem Relation Age of Onset  . Heart failure Father   . Cancer Maternal Aunt   . Heart failure Maternal Uncle   . Dementia Paternal Grandmother   . Cancer Maternal Aunt     Social History:  reports that he quit smoking about 16 years ago. His smoking use included cigarettes. He has a 22.50 pack-year smoking history. He has never used smokeless tobacco. He reports previous alcohol use. He reports that he does not use drugs. He started smoking at age 80. He is smoking 1 1/2 packs/day. Patient denies known exposures to radiation ortoxins. 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 He has not been working recently as the salon was closed. He plays golf occasionally (none recently).He has 8 cats. The patient is alone today.  Allergies: No Known Allergies  Current Medications: Current Outpatient Medications  Medication Sig Dispense Refill  . acetaminophen (TYLENOL) 500 MG tablet Take 500 mg by mouth 3 (three) times daily as needed.    Marland Kitchen albuterol (VENTOLIN HFA) 108 (90 Base) MCG/ACT inhaler Inhale 2 puffs into the lungs every 6 (six) hours as needed for wheezing or shortness of breath. 18 g 3  . ALPRAZolam (XANAX) 0.5 MG tablet Take 1 tablet (0.5 mg total) by mouth 4 (four) times daily as needed for anxiety. 60 tablet 0  . Calcium 600-400 MG-UNIT CHEW Chew 2  tablets by mouth daily.    Marland Kitchen dexamethasone (DECADRON) 4 MG tablet Take 1 tab two times a day the day before Alimta chemo, then take 2 tabs once a day for 3 days starting the day after chemo. 30 tablet 1  . ELIQUIS 5 MG TABS tablet TAKE 1 TABLET BY MOUTH TWICE DAILY 60 tablet 5  . FLUoxetine (PROZAC) 40 MG capsule Take 1 capsule (40 mg total) by mouth daily. 30 capsule 3  . folic acid (FOLVITE) 1 MG tablet Take 1 tablet (1 mg total) by mouth daily. Continue until 21 days after Alimta completed. 100 tablet 3  . Oxycodone  HCl 20 MG TABS TAKE 1/2 TO 1 TABLET BY MOUTH EVERY 4 HOURS AS NEEDED FOR SEVERE PAIN 60 tablet 0  . polyethylene glycol (MIRALAX / GLYCOLAX) packet Take 17 g by mouth daily.    Marland Kitchen senna (SENOKOT) 8.6 MG tablet Take 1 tablet by mouth as needed.     Marland Kitchen ibuprofen (ADVIL,MOTRIN) 200 MG tablet Take 200 mg by mouth every 6 (six) hours as needed. (Patient not taking: Reported on 01/02/2020)    . ondansetron (ZOFRAN ODT) 8 MG disintegrating tablet Take 1 tablet (8 mg total) by mouth every 8 (eight) hours as needed for nausea or vomiting. (Patient not taking: Reported on 12/21/2019) 20 tablet 1   No current facility-administered medications for this visit.   Facility-Administered Medications Ordered in Other Visits  Medication Dose Route Frequency Provider Last Rate Last Admin  . 0.9 %  sodium chloride infusion   Intravenous Once Corcoran, Melissa C, MD      . 0.9 %  sodium chloride infusion   Intravenous Once PRN Corcoran, Melissa C, MD      . albuterol (PROVENTIL) (2.5 MG/3ML) 0.083% nebulizer solution 2.5 mg  2.5 mg Nebulization Once PRN Corcoran, Melissa C, MD      . alteplase (CATHFLO ACTIVASE) injection 2 mg  2 mg Intracatheter Once PRN Lequita Asal, MD      . EPINEPHrine (ADRENALIN) 1 MG/10ML injection 0.25 mg  0.25 mg Intravenous Once PRN Corcoran, Melissa C, MD      . EPINEPHrine (ADRENALIN) 1 MG/10ML injection 0.25 mg  0.25 mg Intravenous Once PRN Corcoran, Melissa C, MD      . heparin lock flush 100 unit/mL  500 Units Intracatheter Once PRN Nolon Stalls C, MD      . heparin lock flush 100 unit/mL  250 Units Intracatheter Once PRN Nolon Stalls C, MD      . heparin lock flush 100 unit/mL  500 Units Intravenous Once Corcoran, Melissa C, MD      . sodium chloride flush (NS) 0.9 % injection 10 mL  10 mL Intravenous PRN Nolon Stalls C, MD   10 mL at 03/04/19 0959  . sodium chloride flush (NS) 0.9 % injection 10 mL  10 mL Intracatheter Once PRN Nolon Stalls C, MD      . sodium  chloride flush (NS) 0.9 % injection 10 mL  10 mL Intravenous PRN Lequita Asal, MD   10 mL at 01/11/20 0917  . sodium chloride flush (NS) 0.9 % injection 3 mL  3 mL Intracatheter Once PRN Lequita Asal, MD        Review of Systems  Constitutional: Positive for weight loss (1 lb). Negative for chills, diaphoresis, fever and malaise/fatigue.       Feels "okay."  HENT: Negative for congestion, ear discharge, ear pain, sore throat and tinnitus.  Sinus irritation.  Eyes: Negative for blurred vision and double vision.  Respiratory: Positive for shortness of breath (on exertion). Negative for cough (chronic; better), hemoptysis and sputum production.   Cardiovascular: Negative for chest pain, palpitations and leg swelling.  Gastrointestinal: Negative for abdominal pain, blood in stool, constipation, diarrhea, heartburn, melena, nausea and vomiting.       Poor appetite. Drinking 1 Ensure per day.  Genitourinary: Negative for dysuria, frequency and urgency.  Musculoskeletal: Positive for back pain. Negative for falls, myalgias and neck pain (better).       Pain/tingling underneath right arm  Skin: Negative for rash.  Neurological: Negative for dizziness, sensory change, focal weakness, weakness and headaches.  Endo/Heme/Allergies: Positive for environmental allergies. Negative for polydipsia. Does not bruise/bleed easily.  Psychiatric/Behavioral: Positive for depression (mood has improved). The patient is nervous/anxious (on Xanax). The patient does not have insomnia (on OTC medication).   All other systems reviewed and are negative.  Performance status (ECOG): 1-2  Vitals Blood pressure 111/75, pulse 77, temperature (!) 96 F (35.6 C), temperature source Tympanic, resp. rate 18, weight 125 lb 10.6 oz (57 kg), SpO2 99 %.   Physical Exam Vitals and nursing note reviewed.  Constitutional:      General: He is not in acute distress.    Appearance: Normal appearance. He is  well-developed. He is not diaphoretic.     Interventions: Face mask in place.     Comments: Thin brown graying hair. Mustache. Dentures  HENT:     Head: Normocephalic and atraumatic.     Right Ear: Hearing normal.     Mouth/Throat:     Mouth: Mucous membranes are moist. No oral lesions.     Pharynx: Oropharynx is clear.  Eyes:     General: No scleral icterus.       Right eye: No discharge.        Left eye: No discharge.     Conjunctiva/sclera: Conjunctivae normal.     Pupils: Pupils are equal, round, and reactive to light.     Comments: Glasses.  Gray/blue eyes.   Neck:     Vascular: No JVD.  Cardiovascular:     Rate and Rhythm: Normal rate and regular rhythm.     Pulses: Normal pulses.     Heart sounds: Normal heart sounds. No murmur heard.  No friction rub. No gallop.   Pulmonary:     Effort: Pulmonary effort is normal. No respiratory distress.     Breath sounds: Normal breath sounds. No wheezing, rhonchi or rales.  Chest:     Chest wall: No tenderness.  Abdominal:     General: Bowel sounds are normal. There is no distension.     Palpations: Abdomen is soft. There is no mass.     Tenderness: There is no abdominal tenderness. There is no guarding or rebound.  Musculoskeletal:        General: Tenderness (At level of T-4 and Right scapula.) present. No swelling. Normal range of motion.     Cervical back: Normal range of motion and neck supple.  Lymphadenopathy:     Head:     Right side of head: No preauricular, posterior auricular or occipital adenopathy.     Left side of head: No preauricular, posterior auricular or occipital adenopathy.     Cervical: No cervical adenopathy.     Upper Body:     Right upper body: No supraclavicular adenopathy.     Left upper body: No supraclavicular adenopathy.  Lower Body: No right inguinal adenopathy. No left inguinal adenopathy.  Skin:    General: Skin is warm and dry.     Coloration: Skin is not pale.     Findings: No bruising,  erythema, lesion or rash.  Neurological:     Mental Status: He is alert and oriented to person, place, and time. Mental status is at baseline.  Psychiatric:        Behavior: Behavior normal.        Thought Content: Thought content normal.        Judgment: Judgment normal.    Imaging studies: 04/30/2018:  PET scanrevealed a 3.4 cm hypermetabolic RLL pulmonary mass (SUV 13.2), 11 mm pulmonary nodule in the LEFT lung (SUV 4.5), RIGHT paratracheal lymph node (SUV 5.1), and hypermetabolic activity within the T4 vertebral body (SUV 10.2).  05/03/2018:  Thoracic spine MRIon 05/03/2018 revealed T4 metastasiswith a 40% pathologic compression deformity and 5 mm of retropulsion of the vertebral body. Retropulsion results in mild spinal canal stenosis and mild bilateral C4-5 foraminal stenosis. There was paravertebral soft tissue thickening from mid T3 to mid T5, likely representing edema related to the pathologic compression deformity vs. possible extraosseous extension of the neoplasm. There were no additional thoracic spinal metastases noted.  05/03/2018:  Head MRIrevealed no intracranial metastatic disease. 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 Incidental mention made of mild paranasal sinus disease. 01/10/2019:  Cervical and thoracic spine CT [facility] unchanged severe compression deformity/vertebral plana of T4 with a stable degree of retropulsion and focal mild spinal canal stenosis. There was interval enlargement of a right lower lobe pulmonary mass(4.5 x 3.5 cm compared to 3.9 x 2.9 cm)with redemonstrated spiculated pulmonary nodules. 01/28/2019:  Chest, abdomen, pelvisCTrevealed slight interval enlargement of a right lower lobe mass with central necrosis measuring 4.6 x 3.4 cm, previously 4.0 x 3.0 cm. There  was no change in right upper lobe nodules(1.4 and 0.8 cm).Therewas almost no residua of left lung nodules, with irregular opacities in the apical left upper lobeandsuperior segment left lower lobe. There was no change in right hilar soft tissue and lymph nodes.There wasvertebra plana deformity of T4andno evidence of new osseous metastatic disease.There was no evidence of distant metastatic disease in the abdomen or pelvis.The1.6 x 1.1 cmleft adrenal nodule(non-metabolic on prior PET scan),was unchanged. 03/24/2019:  Renal ultrasoundrevealed no acute abnormality identified.There was no hydronephrosis or bladder distention. 04/23/2019:  Abdomen and pelvis CT revealed no acute abdominal or pelvic pathology. There was fluid in the colon which can be seen with diarrhea. There was diverticulosis without evidence of diverticulitis. There was a stable 1.5 cm left adrenal nodule. 05/06/2019:  Chest, abdomen, and pelvisCTrevealed a positive response to interval therapy for the known right lower lobe lung carcinoma(4.6 x 3.4 cm to3.1 x 2.8 cm transversely). There hadbeen a mild interval decrease contiguous soft tissue that extends along the right hilum. The two right upper lobe noduleswerestable from the most recent prior study (smaller than 04/27/2018).There are no new lung nodules.Therewasno evidence of new metastatic disease.There was stable severe compression deformity/vertebra plana of T4.Therewasno evidence of other osseous metastatic disease.There was a stable left adrenal nodule consistent with an adenoma. 05/30/2019:  ChestCT angiogramrevealed a tiny subsegmental pulmonary embolusin the right lower lobe. There areas of ground-glass at the periphery of the right chest hadbecome more conspicuous. This mayrelate to mild  pneumonitis, perhaps due to radiation, attention on follow-up.The dominant nodule in the right lung basewasstable in size. Heis onEliquis.  08/14/2019:  Cervical spine MRIrevealed no evidence of metastatic disease within the cervical spine. 09/16/2019:  Chest, abdomen and pelvis CTrevealed asimilar-appearing spiculated right lower lobe nodule.There was no evidence for metastatic disease in the abdomen or pelvis.There was stable left adrenal adenoma. 12/15/2019:  Chest CT revealed an enlarging right lower lobe mass c/w malignancy (3.0 x 2.4 cm to 3.3 x 2.7 cm).  There was slight enlargement in a spiculated right upper lobe subpleural nodule (0.8 x 1.5 cm to 1.2 x 0.7 cm).   Infusion on 01/11/2020  Component Date Value Ref Range Status  . Magnesium 01/11/2020 1.8  1.7 - 2.4 mg/dL Final   Performed at [facility], [address], [place], [place] [address]  . Sodium 01/11/2020 139  135 - 145 mmol/L Final  . Potassium 01/11/2020 4.0  3.5 - 5.1 mmol/L Final  . Chloride 01/11/2020 102  98 - 111 mmol/L Final  . CO2 01/11/2020 26  22 - 32 mmol/L Final  . Glucose, Bld 01/11/2020 127* 70 - 99 mg/dL Final   Glucose reference range applies only to samples taken after fasting for at least 8 hours.  . BUN 01/11/2020 12  8 - 23 mg/dL Final  . Creatinine, Ser 01/11/2020 1.44* 0.61 - 1.24 mg/dL Final  . Calcium 01/11/2020 9.1  8.9 - 10.3 mg/dL Final  . Total Protein 01/11/2020 7.1  6.5 - 8.1 g/dL Final  . Albumin 01/11/2020 3.2* 3.5 - 5.0 g/dL Final  . AST 01/11/2020 27  15 - 41 U/L Final  . ALT 01/11/2020 13  0 - 44 U/L Final  . Alkaline Phosphatase 01/11/2020 60  38 - 126 U/L Final  . Total Bilirubin 01/11/2020 0.3  0.3 - 1.2 mg/dL Final  . GFR calc non Af Amer 01/11/2020 49* >60 mL/min Final  . GFR calc Af Amer 01/11/2020 57* >60 mL/min Final  . Anion gap 01/11/2020 11  5 - 15 Final   Performed at [facility], [address], [place], [place] [address]  . WBC 01/11/2020 7.4  4.0 - 10.5 K/uL Final  . RBC 01/11/2020 3.09* 4.22 - 5.81 MIL/uL Final  . Hemoglobin 01/11/2020 8.9* 13.0 - 17.0 g/dL Final  . HCT  01/11/2020 29.2* 39 - 52 % Final  . MCV 01/11/2020 94.5  80.0 - 100.0 fL Final  . MCH 01/11/2020 28.8  26.0 - 34.0 pg Final  . MCHC 01/11/2020 30.5  30.0 - 36.0 g/dL Final  . RDW 01/11/2020 17.2* 11.5 - 15.5 % Final  . Platelets 01/11/2020 323  150 - 400 K/uL Final  . nRBC 01/11/2020 0.0  0.0 - 0.2 % Final  . Neutrophils Relative % 01/11/2020 64  % Final  . Neutro Abs 01/11/2020 4.8  1.7 - 7.7 K/uL Final  . Lymphocytes Relative 01/11/2020 15  % Final  . Lymphs Abs 01/11/2020 1.1  0.7 - 4.0 K/uL Final  . Monocytes Relative 01/11/2020 14  % Final  . Monocytes Absolute 01/11/2020 1.0  0 - 1 K/uL Final  . Eosinophils Relative 01/11/2020 4  % Final  . Eosinophils Absolute 01/11/2020 0.3  0 - 0 K/uL Final  . Basophils Relative 01/11/2020 1  % Final  . Basophils Absolute 01/11/2020 0.1  0 - 0 K/uL Final  . Immature Granulocytes 01/11/2020 2  % Final  . Abs Immature Granulocytes 01/11/2020 0.11* 0.00 - 0.07 K/uL  Final   Performed at [facility], [address], [place], [place] [address]    Assessment:  Jimmy [facility] is a 69 y.o. male with metastatichigh-grade adenocarcinomaof the right lungs/p CT-guided biopsy of a RLL lung mass on 05/11/2018. Pathologyrevealed an invasive high-grade adenocarcinoma, with predominantly solid growth pattern. The neoplastic cells were TTF-1 (+), Napsin A (+), and P40 (+). He has a T4 vertebral metastasis. Clinical stage is T4N1M1.  There was not enough material for Foundation One testing. PDL-1revealed TPS 90%.  PET scanon 04/30/2018 revealed a 3.4 cm hypermetabolic RLL pulmonary mass (SUV 13.2), 11 mm pulmonary nodule in the LEFT lung (SUV 4.5), RIGHT paratracheal lymph node (SUV 5.1), and hypermetabolic activity within the T4 vertebral body (SUV 10.2).   Thoracic spine MRIon 05/03/2018 revealed T4 metastasiswith a 40% pathologic compression deformity and 5 mm of retropulsion of the vertebral body. Retropulsion results in mild  spinal canal stenosis and mild bilateral C4-5 foraminal stenosis. There was paravertebral soft tissue thickening from mid T3 to mid T5, likely representing edema related to the pathologic compression deformity vs. possible extraosseous extension of the neoplasm. There were no additional thoracic spinal metastases noted.   Head MRIon 05/03/2018 revealed no intracranial metastatic disease. 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 Incidental mention made of mild paranasal sinus disease.  Hereceived 11cycles ofpembrolizumab(05/24/2018 - 02/07/2019). He toleratedtreatment well. CEAwas 1.3 on 08/30/2018.LDHwas 165 on 11/29/2018.  He completed T4 radiationon 07/07/2018. He receives Xgevamonthly (06/03/2018 -07/18/2019).  He received 1 cycle of carboplatin, Alimta, and pembrolizumabon 02/08/2019. Cycle #1 was complicated by nausea, vomiting, and diarrhea necessitating hospitalization.Decision made to hold carboplatin with his second dose.He iss/p2cycles ofAlimta and pembrolizumab (02/28/2019 - 03/29/2019).Cycle #2 was complicated by nausea, vomiting, and dehydration requring fluids in clinic and an overnight stay in the hospital. Hereceivedcarboplatin (AUC 2), Alimta, and pebrolizumab(04/18/2019).He tolerated it poorly.   Hehasreceived13 cycles ofAlimta and pembrolizumabon (05/09/2019 -12/21/2019). He tolerated well. He receives B12every 9 weeks (last05/10/2019).  He hascancer-related painin T4. He is off the [employer].  He is taking oxycodone10-87m every 4 hours prn.  Chest, abdomen and pelvis CTon 09/16/2019 revealed asimilar-appearing spiculated right lower lobe nodule.There was no evidence for metastatic disease in the abdomen or pelvis.There was stable left  adrenal adenoma.  Chest CT on 12/15/2019 revealed an enlarging right lower lobe mass c/w malignancy (3.0 x 2.4 cm to 3.3 x 2.7 cm).  There was slight enlargement in a spiculated right upper lobe subpleural nodule (0.8 x 1.5 cm to 1.2 x 0.7 cm).  He has cervicalgia.Cervical spine MRIon 08/14/2019 showed no evidence of metastatic disease within the cervical spine. Heis followedby Dr YIzora Ribas  He has chemotherapy induced anemia.  He received Retacrit on 11/01/2019 (last 12/09/2019).  Hehad transient renal insufficiencyon 03/23/2019. Creatinine was 1.32 (baseline 0.61-1.0). Renal ultrasoundon 03/24/2019 revealed no acute abnormality identified.There was no hydronephrosis or bladder distention.Creatinine is 0.82 today.  Stool waspositive for C difficile + diarrheaon 06/14/2019.Hewas treated with oral vancomycin.  He received the influenza vaccineon 05/16/2019.   Symptomatically, he feels "okay".  His energy level has improved with rise in his hemoglobin.  Mood appears slightly better.  He is slightly dehydrated.  Creatinine clearance is < 45 ml/min.  Plan: 1.   Labs today: CBC with diff, CMP, Mg, TSH, ferritin, iron studies. 2. Metastatic high-grade adenocarcinoma the RIGHT lung He is s/p  11 cycles of pembrolizumab. He is s/p 1 cycle of carboplatin, Almita, and pembrolizumab (02/08/2019). He is s/p 1 cycle of carboplatin (AUC 2), Alimta, and pembrolizumab (04/18/2019). He is s/p13cycles of Alimta and pembrolizumab (02/28/2019 - 03/29/2019; 05/09/2019-12/21/2019). Restaging CT scans on 05/27/2021revealed slight growth (3-4 mm).  He would like to continue treatment. Labs reviewed.  Chemotherapy postponed today secondary to creatinine clearance.  Encourage hydration.  Anticipate growth factor support given last 2 cycles with resultant neutropenia. Continue B12 every 9 weeks (last05/10/2019; due 01/24/2020). Continue folic acid. Discuss symptom  management.  He has antiemetics and pain medications at home to use on a prn bases.  Interventions are adequate.    3. Bone metastasis Symptomatically, his bone pain remains well controlled He notes some pain in tingling in his right shoulder scapula.  Follow-up bone scan.    Last Xgeva on04/13/2021. Anticipate reinitiation of Xgeva with stability in creatinine  Pain medications written by [name], NP 4.Anxiety and depression Patient on low dose Xanax.  Patient currently has an appointment with psychiatry in 02/2020. 5.Pulmonary embolism Patient on Eliquis long term.  Denies any bruising or bleeding.  Ensure platelets>50,000 on treatment. 6.Chemotherapy induced anemia Hematocrit29.2. Hemoglobin8.9. MCV 94.5today. [phone] an iron saturation of 9 % and a TIBC of312 today. Continue Retacrit per protocol (last 01/02/2020). 7.   No treatment today secondary to CrCl. 8.   Marchia Bond for next cycle of treatment. 9.   Bone scan on 01/16/2020. 10.   RTC in 1 week for MD assessment, labs (CBC with diff, CMP), review of bone scan, Alimta and pembrolizumab and +/- Retacrit.  I discussed the assessment and treatment plan with the patient.  The patient was provided an opportunity to ask questions and all were answered.  The patient agreed with the plan and demonstrated an understanding of the instructions.  The patient was advised to call back if the symptoms worsen or if the condition fails to improve as anticipated.  I provided 19 minutes of face-to-face time during this encounter and > 50% was spent counseling as documented under my assessment and plan.    Lequita Asal, MD, PhD 01/11/2020, 10:14 AM  I, De Burrs, am acting as Education administrator for [employer]. Mike Gip, MD, PhD.  I, Melissa C. Mike Gip, MD, have reviewed the above documentation  for accuracy and completeness, and I agree with the above.

## 2020-01-10 ENCOUNTER — Encounter: Payer: Self-pay | Admitting: Hematology and Oncology

## 2020-01-10 ENCOUNTER — Other Ambulatory Visit: Payer: Self-pay

## 2020-01-10 NOTE — Progress Notes (Signed)
The patient c/o right upper back pain ( 5)  Today. The patient Name and DOB has been verified by phone today.

## 2020-01-11 ENCOUNTER — Telehealth: Payer: Self-pay | Admitting: Hematology and Oncology

## 2020-01-11 ENCOUNTER — Inpatient Hospital Stay: Payer: Medicare Other

## 2020-01-11 ENCOUNTER — Other Ambulatory Visit: Payer: Self-pay

## 2020-01-11 ENCOUNTER — Other Ambulatory Visit: Payer: Self-pay | Admitting: Hematology and Oncology

## 2020-01-11 ENCOUNTER — Inpatient Hospital Stay (HOSPITAL_BASED_OUTPATIENT_CLINIC_OR_DEPARTMENT_OTHER): Payer: Medicare Other | Admitting: Hematology and Oncology

## 2020-01-11 ENCOUNTER — Encounter: Payer: Self-pay | Admitting: Hematology and Oncology

## 2020-01-11 VITALS — BP 111/75 | HR 77 | Temp 96.0°F | Resp 18 | Wt 125.7 lb

## 2020-01-11 DIAGNOSIS — C3431 Malignant neoplasm of lower lobe, right bronchus or lung: Secondary | ICD-10-CM

## 2020-01-11 DIAGNOSIS — D6481 Anemia due to antineoplastic chemotherapy: Secondary | ICD-10-CM

## 2020-01-11 DIAGNOSIS — T451X5A Adverse effect of antineoplastic and immunosuppressive drugs, initial encounter: Secondary | ICD-10-CM

## 2020-01-11 DIAGNOSIS — I2782 Chronic pulmonary embolism: Secondary | ICD-10-CM

## 2020-01-11 DIAGNOSIS — C7951 Secondary malignant neoplasm of bone: Secondary | ICD-10-CM

## 2020-01-11 DIAGNOSIS — R7989 Other specified abnormal findings of blood chemistry: Secondary | ICD-10-CM

## 2020-01-11 DIAGNOSIS — M898X1 Other specified disorders of bone, shoulder: Secondary | ICD-10-CM

## 2020-01-11 DIAGNOSIS — Z5112 Encounter for antineoplastic immunotherapy: Secondary | ICD-10-CM

## 2020-01-11 DIAGNOSIS — Z5111 Encounter for antineoplastic chemotherapy: Secondary | ICD-10-CM | POA: Diagnosis not present

## 2020-01-11 DIAGNOSIS — F329 Major depressive disorder, single episode, unspecified: Secondary | ICD-10-CM

## 2020-01-11 LAB — CBC WITH DIFFERENTIAL/PLATELET
Abs Immature Granulocytes: 0.11 10*3/uL — ABNORMAL HIGH (ref 0.00–0.07)
Basophils Absolute: 0.1 10*3/uL (ref 0.0–0.1)
Basophils Relative: 1 %
Eosinophils Absolute: 0.3 10*3/uL (ref 0.0–0.5)
Eosinophils Relative: 4 %
HCT: 29.2 % — ABNORMAL LOW (ref 39.0–52.0)
Hemoglobin: 8.9 g/dL — ABNORMAL LOW (ref 13.0–17.0)
Immature Granulocytes: 2 %
Lymphocytes Relative: 15 %
Lymphs Abs: 1.1 10*3/uL (ref 0.7–4.0)
MCH: 28.8 pg (ref 26.0–34.0)
MCHC: 30.5 g/dL (ref 30.0–36.0)
MCV: 94.5 fL (ref 80.0–100.0)
Monocytes Absolute: 1 10*3/uL (ref 0.1–1.0)
Monocytes Relative: 14 %
Neutro Abs: 4.8 10*3/uL (ref 1.7–7.7)
Neutrophils Relative %: 64 %
Platelets: 323 10*3/uL (ref 150–400)
RBC: 3.09 MIL/uL — ABNORMAL LOW (ref 4.22–5.81)
RDW: 17.2 % — ABNORMAL HIGH (ref 11.5–15.5)
WBC: 7.4 10*3/uL (ref 4.0–10.5)
nRBC: 0 % (ref 0.0–0.2)

## 2020-01-11 LAB — COMPREHENSIVE METABOLIC PANEL
ALT: 13 U/L (ref 0–44)
AST: 27 U/L (ref 15–41)
Albumin: 3.2 g/dL — ABNORMAL LOW (ref 3.5–5.0)
Alkaline Phosphatase: 60 U/L (ref 38–126)
Anion gap: 11 (ref 5–15)
BUN: 12 mg/dL (ref 8–23)
CO2: 26 mmol/L (ref 22–32)
Calcium: 9.1 mg/dL (ref 8.9–10.3)
Chloride: 102 mmol/L (ref 98–111)
Creatinine, Ser: 1.44 mg/dL — ABNORMAL HIGH (ref 0.61–1.24)
GFR calc Af Amer: 57 mL/min — ABNORMAL LOW (ref >60)
GFR calc non Af Amer: 49 mL/min — ABNORMAL LOW (ref >60)
Glucose, Bld: 127 mg/dL — ABNORMAL HIGH (ref 70–99)
Potassium: 4 mmol/L (ref 3.5–5.1)
Sodium: 139 mmol/L (ref 135–145)
Total Bilirubin: 0.3 mg/dL (ref 0.3–1.2)
Total Protein: 7.1 g/dL (ref 6.5–8.1)

## 2020-01-11 LAB — FERRITIN: Ferritin: 1129 ng/mL — ABNORMAL HIGH (ref 24–336)

## 2020-01-11 LAB — IRON AND TIBC
Iron: 55 ug/dL (ref 45–182)
Saturation Ratios: 18 % (ref 17.9–39.5)
TIBC: 312 ug/dL (ref 250–450)
UIBC: 257 ug/dL

## 2020-01-11 LAB — T4, FREE: Free T4: 1.04 ng/dL (ref 0.61–1.12)

## 2020-01-11 LAB — TSH: TSH: 5.394 u[IU]/mL — ABNORMAL HIGH (ref 0.350–4.500)

## 2020-01-11 LAB — MAGNESIUM: Magnesium: 1.8 mg/dL (ref 1.7–2.4)

## 2020-01-11 MED ORDER — HEPARIN SOD (PORK) LOCK FLUSH 100 UNIT/ML IV SOLN
500.0000 [IU] | Freq: Once | INTRAVENOUS | Status: AC
  Administered 2020-01-11: 500 [IU] via INTRAVENOUS
  Filled 2020-01-11: qty 5

## 2020-01-11 MED ORDER — SODIUM CHLORIDE 0.9% FLUSH
10.0000 mL | INTRAVENOUS | Status: DC | PRN
  Administered 2020-01-11: 10 mL via INTRAVENOUS
  Filled 2020-01-11: qty 10

## 2020-01-11 NOTE — Telephone Encounter (Signed)
I called [name] to reschedule his Nutrition appt with Jennet Maduro. He said he did not want to reschedule at this time.

## 2020-01-11 NOTE — Progress Notes (Signed)
Patient states he has back pain and is whole right shoulder and underarm area (8/10)

## 2020-01-13 ENCOUNTER — Other Ambulatory Visit: Payer: Self-pay | Admitting: *Deleted

## 2020-01-13 DIAGNOSIS — F419 Anxiety disorder, unspecified: Secondary | ICD-10-CM

## 2020-01-13 DIAGNOSIS — G893 Neoplasm related pain (acute) (chronic): Secondary | ICD-10-CM

## 2020-01-13 MED ORDER — OXYCODONE HCL 20 MG PO TABS: ORAL_TABLET | ORAL | 0 refills | Status: DC

## 2020-01-13 MED ORDER — ALPRAZOLAM 0.5 MG PO TABS: 0.5000 mg | ORAL_TABLET | Freq: Four times a day (QID) | ORAL | 0 refills | Status: DC | PRN

## 2020-01-13 NOTE — Telephone Encounter (Signed)
Patient called cancer center requesting refill of oxycodone 20 mg tablets every 4 hours as needed for severe pain and Xanax 0.5 mg tablets every 4 hours as needed for anxiety.  As mandated by the [place] STOP Act (Strengthen Opioid Misuse Prevention), the [place] Controlled Substance Reporting System ([place]) was reviewed for this patient. Below is the past 18-months of controlled substance prescriptions as displayed by the registry. I have personally consulted with my supervising physician, Dr. Rogue Bussing, who agrees that continuation of opiate therapy is medically appropriate at this time and agrees to provide continual monitoring, including urine/blood drug screens, as indicated. Refill is appropriate on or after 01/11/2020.   NCCSRS reviewed: Reviewed and appropriate    Faythe Casa, NP 01/13/2020 12:30 PM [phone]

## 2020-01-16 ENCOUNTER — Other Ambulatory Visit: Payer: Self-pay

## 2020-01-16 ENCOUNTER — Encounter
Admission: RE | Admit: 2020-01-16 | Discharge: 2020-01-16 | Disposition: A | Discharge status: Home / Self Care | Payer: Medicare Other | Source: Ambulatory Visit | Attending: Hematology and Oncology | Admitting: Hematology and Oncology

## 2020-01-16 DIAGNOSIS — C7951 Secondary malignant neoplasm of bone: Secondary | ICD-10-CM | POA: Diagnosis present

## 2020-01-16 DIAGNOSIS — M898X1 Other specified disorders of bone, shoulder: Secondary | ICD-10-CM

## 2020-01-16 MED ORDER — TECHNETIUM TC 99M MEDRONATE IV KIT
23.3000 | PACK | Freq: Once | INTRAVENOUS | Status: AC | PRN
  Administered 2020-01-16: 23.3 via INTRAVENOUS

## 2020-01-16 NOTE — Progress Notes (Signed)
[facility]  [address], [address] [place], [place] [address] Phone: [phone]  Fax: [phone]   Clinic Day:  01/18/2020  Referring physician: [name], NP  Chief Complaint: Jimmy West is a 69 y.o. male with metastatic adenocarcinoma of the lung who is seen for assessment prior to cycle #14 Alimta and pembrolizumab.  HPI: The patient was last seen in the medical oncology clinic on 01/11/2020. At that time, he felt "ok." His energy level had increased after Retacrit. His mood had slightly improved. He still got short of breath with minimal exertion. He reported some pain/tingling on his right underarm and shoulder blade. He had stopped using the Fentanyl but is still taking oxycodone every 4 hours. Hematocrit was 29.2, hemoglobin 8.9, MCV 94.5, platelets 323,000, WBC 7,400. Creatinine was 1.44. Ferritin was 1,129 with an iron saturation of 14% and a TIBC of 349.  Treatment was held secondary to an elevated creatinine at 1.44 (CrCl 49 ml/min).   Bone scan on 01/16/2020 revealed no scintigraphic evidence of osseous metastatic disease.  During the interim, he has been good. He is breathing okay .  He denies nausea, vomiting, and diarrhea. He took his folic acid and his steroids. He has been eating well.   Past Medical History:  Diagnosis Date  . Bulging lumbar disc   . Cancer ([place])    stage 4 lung cancer  . Heart attack [facility])     Past Surgical History:  Procedure Laterality Date  . CARDIAC CATHETERIZATION     two stents  . KNEE SURGERY Left   . PORTA CATH INSERTION N/A 05/21/2018   Procedure: PORTA CATH INSERTION;  Surgeon: Algernon Huxley, MD;  Location: [facility];  Service: Cardiovascular;  Laterality: N/A;    Family History  Problem Relation Age of Onset  . Heart failure Father   . Cancer Maternal Aunt   . Heart failure Maternal Uncle   . Dementia Paternal Grandmother   . Cancer Maternal Aunt     Social History:   reports that he quit smoking about 16 years ago. His smoking use included cigarettes. He has a 22.50 pack-year smoking history. He has never used smokeless tobacco. He reports previous alcohol use. He reports that he does not use drugs. He started smoking at age 65. He is smoking 1 1/2 packs/day. Patient denies known exposures to radiation ortoxins. 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 He has not been working recently as the salon was closed. He plays golf occasionally (none recently).He has 8 cats. The patient is alone today.  Allergies: No Known Allergies  Current Medications: Current Outpatient Medications  Medication Sig Dispense Refill  . acetaminophen (TYLENOL) 500 MG tablet Take 500 mg by mouth 3 (three) times daily as needed.    Marland Kitchen albuterol (VENTOLIN HFA) 108 (90 Base) MCG/ACT inhaler Inhale 2 puffs into the lungs every 6 (six) hours as needed for wheezing or shortness of breath. 18 g 3  . ALPRAZolam (XANAX) 0.5 MG tablet Take 1 tablet (0.5 mg total) by mouth 4 (four) times daily as needed for anxiety. 60 tablet 0  . Calcium 600-400 MG-UNIT CHEW Chew 2 tablets by mouth daily.    Marland Kitchen dexamethasone (DECADRON) 4 MG tablet Take 1 tab two times a day the day before Alimta chemo, then take 2 tabs once a day for 3 days starting the day after chemo. 30 tablet 1  . ELIQUIS 5 MG TABS tablet TAKE  1 TABLET BY MOUTH TWICE DAILY 60 tablet 5  . FLUoxetine (PROZAC) 40 MG capsule Take 1 capsule (40 mg total) by mouth daily. 30 capsule 3  . folic acid (FOLVITE) 1 MG tablet Take 1 tablet (1 mg total) by mouth daily. Continue until 21 days after Alimta completed. 100 tablet 3  . Oxycodone HCl 20 MG TABS TAKE 1/2 TO 1 TABLET BY MOUTH EVERY 4 HOURS AS NEEDED FOR SEVERE PAIN 60 tablet 0  . polyethylene glycol (MIRALAX / GLYCOLAX) packet Take 17 g by mouth daily.    Marland Kitchen senna (SENOKOT) 8.6 MG tablet Take 1 tablet by mouth as needed.     Marland Kitchen ibuprofen (ADVIL,MOTRIN) 200 MG tablet Take  200 mg by mouth every 6 (six) hours as needed. (Patient not taking: Reported on 01/02/2020)    . ondansetron (ZOFRAN ODT) 8 MG disintegrating tablet Take 1 tablet (8 mg total) by mouth every 8 (eight) hours as needed for nausea or vomiting. (Patient not taking: Reported on 12/21/2019) 20 tablet 1   No current facility-administered medications for this visit.   Facility-Administered Medications Ordered in Other Visits  Medication Dose Route Frequency Provider Last Rate Last Admin  . 0.9 %  sodium chloride infusion   Intravenous Once Lucillie Kiesel C, MD      . 0.9 %  sodium chloride infusion   Intravenous Once PRN Jerel Sardina C, MD      . albuterol (PROVENTIL) (2.5 MG/3ML) 0.083% nebulizer solution 2.5 mg  2.5 mg Nebulization Once PRN Rowan Pollman C, MD      . alteplase (CATHFLO ACTIVASE) injection 2 mg  2 mg Intracatheter Once PRN Lequita Asal, MD      . EPINEPHrine (ADRENALIN) 1 MG/10ML injection 0.25 mg  0.25 mg Intravenous Once PRN Marca Gadsby C, MD      . EPINEPHrine (ADRENALIN) 1 MG/10ML injection 0.25 mg  0.25 mg Intravenous Once PRN Juri Dinning C, MD      . heparin lock flush 100 unit/mL  500 Units Intracatheter Once PRN Nolon Stalls C, MD      . heparin lock flush 100 unit/mL  250 Units Intracatheter Once PRN Nolon Stalls C, MD      . heparin lock flush 100 unit/mL  500 Units Intravenous Once Aavya Shafer C, MD      . sodium chloride flush (NS) 0.9 % injection 10 mL  10 mL Intravenous PRN Nolon Stalls C, MD   10 mL at 03/04/19 0959  . sodium chloride flush (NS) 0.9 % injection 10 mL  10 mL Intracatheter Once PRN Nolon Stalls C, MD      . sodium chloride flush (NS) 0.9 % injection 10 mL  10 mL Intravenous PRN Lequita Asal, MD   10 mL at 01/18/20 0846  . sodium chloride flush (NS) 0.9 % injection 3 mL  3 mL Intracatheter Once PRN Lequita Asal, MD        Review of Systems  Constitutional: Negative for chills,  diaphoresis, fever, malaise/fatigue and weight loss (up 6 lbs).       Feels good.  HENT: Negative for congestion, ear discharge, ear pain, hearing loss, nosebleeds, sinus pain, sore throat and tinnitus.   Eyes: Negative for blurred vision and double vision.  Respiratory: Negative for cough (chronic; better), hemoptysis, sputum production and shortness of breath.   Cardiovascular: Negative for chest pain, palpitations and leg swelling.  Gastrointestinal: Negative for abdominal pain, blood in stool, constipation, diarrhea, heartburn, melena, nausea and  vomiting.       Appetite increased  Genitourinary: Negative for dysuria, frequency and urgency.  Musculoskeletal: Negative for back pain, falls, joint pain, myalgias and neck pain.  Skin: Negative for rash.  Neurological: Negative for dizziness, sensory change, focal weakness, weakness and headaches.  Endo/Heme/Allergies: Positive for environmental allergies. Negative for polydipsia. Does not bruise/bleed easily.  Psychiatric/Behavioral: Positive for depression (mood has improved). Negative for memory loss. The patient is nervous/anxious (on Xanax). The patient does not have insomnia (on OTC medication).   All other systems reviewed and are negative.  Performance status (ECOG): 1  Vitals Blood pressure (!) 101/59, pulse 77, temperature 97.9 F (36.6 C), temperature source Tympanic, resp. rate 18, height _0  (1.676 m), weight 131 lb 2.8 oz (59.5 kg), SpO2 97 %.   Physical Exam Vitals and nursing note reviewed.  Constitutional:      General: He is not in acute distress.    Appearance: Normal appearance. He is well-developed. He is not diaphoretic.     Interventions: Face mask in place.     Comments: Thin brown graying hair. Mustache. Dentures  HENT:     Head: Normocephalic and atraumatic.     Right Ear: Hearing normal.     Mouth/Throat:     Mouth: Mucous membranes are moist. No oral lesions.     Pharynx: Oropharynx is clear.  Eyes:      General: No scleral icterus.       Right eye: No discharge.        Left eye: No discharge.     Conjunctiva/sclera: Conjunctivae normal.     Pupils: Pupils are equal, round, and reactive to light.     Comments: Glasses.  Gray/blue eyes.  Neck:     Vascular: No JVD.  Cardiovascular:     Rate and Rhythm: Normal rate and regular rhythm.     Pulses: Normal pulses.     Heart sounds: Normal heart sounds. No murmur heard.  No friction rub. No gallop.   Pulmonary:     Effort: Pulmonary effort is normal. No respiratory distress.     Breath sounds: Normal breath sounds. No wheezing, rhonchi or rales.  Chest:     Chest wall: No tenderness.  Abdominal:     General: Bowel sounds are normal. There is no distension.     Palpations: Abdomen is soft. There is no mass.     Tenderness: There is no abdominal tenderness. There is no guarding or rebound.  Musculoskeletal:        General: No swelling or tenderness. Normal range of motion.     Cervical back: Normal range of motion and neck supple.  Lymphadenopathy:     Head:     Right side of head: No preauricular, posterior auricular or occipital adenopathy.     Left side of head: No preauricular, posterior auricular or occipital adenopathy.     Cervical: No cervical adenopathy.     Upper Body:     Right upper body: No supraclavicular or axillary adenopathy.     Left upper body: No supraclavicular or axillary adenopathy.     Lower Body: No right inguinal adenopathy. No left inguinal adenopathy.  Skin:    General: Skin is warm and dry.     Coloration: Skin is not pale.     Findings: No bruising, erythema, lesion or rash.  Neurological:     Mental Status: He is alert and oriented to person, place, and time. Mental status is at baseline.  Psychiatric:        Behavior: Behavior normal.        Thought Content: Thought content normal.        Judgment: Judgment normal.    Imaging studies: 04/30/2018:  PET scanrevealed a 3.4 cm hypermetabolic RLL  pulmonary mass (SUV 13.2), 11 mm pulmonary nodule in the LEFT lung (SUV 4.5), RIGHT paratracheal lymph node (SUV 5.1), and hypermetabolic activity within the T4 vertebral body (SUV 10.2).  05/03/2018:  Thoracic spine MRIon 05/03/2018 revealed T4 metastasiswith a 40% pathologic compression deformity and 5 mm of retropulsion of the vertebral body. Retropulsion results in mild spinal canal stenosis and mild bilateral C4-5 foraminal stenosis. There was paravertebral soft tissue thickening from mid T3 to mid T5, likely representing edema related to the pathologic compression deformity vs. possible extraosseous extension of the neoplasm. There were no additional thoracic spinal metastases noted.  05/03/2018:  Head MRIrevealed no intracranial metastatic disease. 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 Incidental mention made of mild paranasal sinus disease. 01/10/2019:  Cervical and thoracic spine CT at [facility] unchanged severe compression deformity/vertebral plana of T4 with a stable degree of retropulsion and focal mild spinal canal stenosis. There was interval enlargement of a right lower lobe pulmonary mass(4.5 x 3.5 cm compared to 3.9 x 2.9 cm)with redemonstrated spiculated pulmonary nodules. 01/28/2019:  Chest, abdomen, pelvisCTrevealed slight interval enlargement of a right lower lobe mass with central necrosis measuring 4.6 x 3.4 cm, previously 4.0 x 3.0 cm. There was no change in right upper lobe nodules(1.4 and 0.8 cm).Therewas almost no residua of left lung nodules, with irregular opacities in the apical left upper lobeandsuperior segment left lower lobe. There was no change in right hilar soft tissue and lymph nodes.There wasvertebra plana deformity of T4andno evidence of new osseous metastatic  disease.There was no evidence of distant metastatic disease in the abdomen or pelvis.The1.6 x 1.1 cmleft adrenal nodule(non-metabolic on prior PET scan),was unchanged. 03/24/2019:  Renal ultrasoundrevealed no acute abnormality identified.There was no hydronephrosis or bladder distention. 04/23/2019:  Abdomen and pelvis CT revealed no acute abdominal or pelvic pathology. There was fluid in the colon which can be seen with diarrhea. There was diverticulosis without evidence of diverticulitis. There was a stable 1.5 cm left adrenal nodule. 05/06/2019:  Chest, abdomen, and pelvisCTrevealed a positive response to interval therapy for the known right lower lobe lung carcinoma(4.6 x 3.4 cm to3.1 x 2.8 cm transversely). There hadbeen a mild interval decrease contiguous soft tissue that extends along the right hilum. The two right upper lobe noduleswerestable from the most recent prior study (smaller than 04/27/2018).There are no new lung nodules.Therewasno evidence of new metastatic disease.There was stable severe compression deformity/vertebra plana of T4.Therewasno evidence of other osseous metastatic disease.There was a stable left adrenal nodule consistent with an adenoma. 05/30/2019:  ChestCT angiogramrevealed a tiny subsegmental pulmonary embolusin the right lower lobe. There areas of ground-glass at the periphery of the right chest hadbecome more conspicuous. This mayrelate to mild pneumonitis, perhaps due to radiation, attention on follow-up.The dominant nodule in the right lung basewasstable in size. Heis onEliquis. 08/14/2019:  Cervical spine MRIrevealed no evidence of metastatic disease within the cervical spine. 09/16/2019:  Chest, abdomen and pelvis CTrevealed asimilar-appearing spiculated right lower lobe nodule.There was no evidence for metastatic disease in the abdomen or pelvis.There was stable left adrenal adenoma. 12/15/2019:  Chest  CT revealed an  enlarging right lower lobe mass c/w malignancy (3.0 x 2.4 cm to 3.3 x 2.7 cm).  There was slight enlargement in a spiculated right upper lobe subpleural nodule (0.8 x 1.5 cm to 1.2 x 0.7 cm). 01/16/2020:  Bone scan revealed no scintigraphic evidence of osseous metastatic disease.   Infusion on 01/18/2020  Component Date Value Ref Range Status  . Sodium 01/18/2020 138  135 - 145 mmol/L Final  . Potassium 01/18/2020 3.8  3.5 - 5.1 mmol/L Final  . Chloride 01/18/2020 104  98 - 111 mmol/L Final  . CO2 01/18/2020 26  22 - 32 mmol/L Final  . Glucose, Bld 01/18/2020 130* 70 - 99 mg/dL Final   Glucose reference range applies only to samples taken after fasting for at least 8 hours.  . BUN 01/18/2020 9  8 - 23 mg/dL Final  . Creatinine, Ser 01/18/2020 1.24  0.61 - 1.24 mg/dL Final  . Calcium 01/18/2020 8.4* 8.9 - 10.3 mg/dL Final  . Total Protein 01/18/2020 6.5  6.5 - 8.1 g/dL Final  . Albumin 01/18/2020 3.1* 3.5 - 5.0 g/dL Final  . AST 01/18/2020 26  15 - 41 U/L Final  . ALT 01/18/2020 13  0 - 44 U/L Final  . Alkaline Phosphatase 01/18/2020 77  38 - 126 U/L Final  . Total Bilirubin 01/18/2020 0.6  0.3 - 1.2 mg/dL Final  . GFR calc non Af Amer 01/18/2020 59* >60 mL/min Final  . GFR calc Af Amer 01/18/2020 >60  >60 mL/min Final  . Anion gap 01/18/2020 8  5 - 15 Final   Performed at [facility], [address], [place], [place] [address]  . WBC 01/18/2020 6.1  4.0 - 10.5 K/uL Final  . RBC 01/18/2020 2.97* 4.22 - 5.81 MIL/uL Final  . Hemoglobin 01/18/2020 8.7* 13.0 - 17.0 g/dL Final  . HCT 01/18/2020 28.2* 39 - 52 % Final  . MCV 01/18/2020 94.9  80.0 - 100.0 fL Final  . MCH 01/18/2020 29.3  26.0 - 34.0 pg Final  . MCHC 01/18/2020 30.9  30.0 - 36.0 g/dL Final  . RDW 01/18/2020 16.6* 11.5 - 15.5 % Final  . Platelets 01/18/2020 252  150 - 400 K/uL Final  . nRBC 01/18/2020 0.0  0.0 - 0.2 % Final  . Neutrophils Relative % 01/18/2020 70  % Final  . Neutro Abs 01/18/2020 4.3  1.7 - 7.7  K/uL Final  . Lymphocytes Relative 01/18/2020 12  % Final  . Lymphs Abs 01/18/2020 0.7  0.7 - 4.0 K/uL Final  . Monocytes Relative 01/18/2020 10  % Final  . Monocytes Absolute 01/18/2020 0.6  0 - 1 K/uL Final  . Eosinophils Relative 01/18/2020 7  % Final  . Eosinophils Absolute 01/18/2020 0.5  0 - 0 K/uL Final  . Basophils Relative 01/18/2020 1  % Final  . Basophils Absolute 01/18/2020 0.1  0 - 0 K/uL Final  . Immature Granulocytes 01/18/2020 0  % Final  . Abs Immature Granulocytes 01/18/2020 0.02  0.00 - 0.07 K/uL Final   Performed at [facility], [address] [place], [place] [address]    Assessment:  Creek Gan is a 69 y.o. male with metastatichigh-grade adenocarcinomaof the right lungs/p CT-guided biopsy of a RLL lung mass on 05/11/2018. Pathologyrevealed an invasive high-grade adenocarcinoma, with predominantly solid growth pattern. The neoplastic cells were TTF-1 (+), Napsin A (+), and P40 (+). He has a T4 vertebral metastasis. Clinical stage is T4N1M1.  There was not enough material for [facility] One testing. PDL-1revealed TPS 90%.  PET scanon 04/30/2018 revealed a 3.4 cm hypermetabolic RLL pulmonary mass (SUV 13.2), 11 mm pulmonary nodule in the LEFT lung (SUV 4.5), RIGHT paratracheal lymph node (SUV 5.1), and hypermetabolic activity within the T4 vertebral body (SUV 10.2).   Thoracic spine MRIon 05/03/2018 revealed T4 metastasiswith a 40% pathologic compression deformity and 5 mm of retropulsion of the vertebral body. Retropulsion results in mild spinal canal stenosis and mild bilateral C4-5 foraminal stenosis. There was paravertebral soft tissue thickening from mid T3 to mid T5, likely representing edema related to the pathologic compression deformity vs. possible extraosseous extension of the neoplasm. There were no additional thoracic spinal metastases noted.   Head MRIon 05/03/2018 revealed no intracranial metastatic disease. There  were mild chronic microvascular ischemic changes and volume loss of brain, in addition to small chronic cortical infarctions within the left parietal lobe and small right caudate head chronic lacunar infarct. Incidental mention made of mild paranasal sinus disease.  Hereceived 11cycles ofpembrolizumab(05/24/2018 - 02/07/2019). He toleratedtreatment well. CEAwas 1.3 on 08/30/2018.LDHwas 165 on 11/29/2018.  He completed T4 radiationon 07/07/2018. He receives Xgevamonthly (06/03/2018 -11/01/2019).  He received 1 cycle of carboplatin, Alimta, and pembrolizumabon 02/08/2019. Cycle #1 was complicated by nausea, vomiting, and diarrhea necessitating hospitalization.Decision made to hold carboplatin with his second dose.He iss/p2cycles ofAlimta and pembrolizumab (02/28/2019 - 03/29/2019).Cycle #2 was complicated by nausea, vomiting, and dehydration requring fluids in clinic and an overnight stay in the hospital. Hereceivedcarboplatin (AUC 2), Alimta, and pebrolizumab(04/18/2019).He tolerated it poorly.   Hehasreceived13 cycles ofAlimta and pembrolizumabon (05/09/2019 -12/21/2019). He tolerated well. He receives B12every 9 weeks (last05/10/2019).  He hascancer-related painin T4. He is off the [employer].  He is taking oxycodone10-6m every 4 hours prn.  Chest, abdomen and pelvis CTon 09/16/2019 revealed asimilar-appearing spiculated right lower lobe nodule.There was no evidence for metastatic disease in the abdomen or pelvis.There was stable left adrenal adenoma.  Chest CT on 12/15/2019 revealed an enlarging right lower lobe mass c/w malignancy (3.0 x 2.4 cm to 3.3 x 2.7 cm).  There was slight enlargement in a spiculated right upper lobe subpleural nodule (0.8 x 1.5 cm to 1.2 x 0.7 cm).  Bone scan on 01/16/2020 revealed no scintigraphic evidence of osseous metastatic disease.  He has cervicalgia.Cervical spine MRIon 08/14/2019 showed no  evidence of metastatic disease within the cervical spine. Heis followedby Dr YIzora Ribas  He has chemotherapy induced anemia.  He received Retacrit on 11/01/2019 (last 12/09/2019).  Hehad transient renal insufficiencyon 03/23/2019. Creatinine was 1.32 (baseline 0.61-1.0). Renal ultrasoundon 03/24/2019 revealed no acute abnormality identified.There was no hydronephrosis or bladder distention.Creatinine is 0.82 today.  Stool waspositive for C difficile + diarrheaon 06/14/2019.Hewas treated with oral vancomycin.  He received the influenza vaccineon 05/16/2019.   Symptomatically, he is doing well today.  Overall, he feels better.  Exam is stable.  Plan: 1.   Labs:  CBC with diff, CMP. 2. Metastatic high-grade adenocarcinoma the RIGHT lung He is s/p 11 cycles of pembrolizumab. He is s/p 1 cycle of carboplatin, Almita, and pembrolizumab (02/08/2019). He is s/p 1 cycle of carboplatin (AUC 2), Alimta, and pembrolizumab (04/18/2019). He is s/p13cycles of Alimta and pembrolizumab (02/28/2019 - 03/29/2019; 05/09/2019-12/21/2019). Restaging CT scans on 05/27/2021revealed slight growth (3-4 mm). Labs reviewed.  Cycle #14 Alimta and pembrolizumab.  Patient took his premedications of steroids and continues his daily folic acid.  B12 given today.  Discuss plans for you dentia secondary to neutropenia with last 2 cycles.  Encourage patient to take Claritin for potential growth factor-induced bone pain  Anticipate restaging studies in 02/2020.  Discuss symptom management.  He has antiemetics and pain medications at home to use on a prn bases.  Interventions are adequate.        3. Bone metastasis Symptomatically, he is doing well.  Bone scan on 01/16/2020 revealed no evidence of metastatic disease.  He last received Xgeva on 11/01/2019.  Anticipate reinstitution of Xgeva with stabilization of creatinine. 4.Anxiety and depression Patient is on  low-dose Xanax as well as Prozac.  He is scheduled to see psychiatry in 02/2020. 5.Pulmonary embolism Patient is on Eliquis.  He denies any bleeding.  Maintain platelets > 50,000. 6.Chemotherapy induced anemia Hematocrit28.2. Hemoglobin8.7. MCV 94.9today.  [phone] an iron saturation of 18% and a TIBC of312on 01/11/2020. Patient last received Retacrit on 01/02/2020.  Retacrit today and every 2 weeks per protocol. 7.   Cycle #14 Alimta and pembrolizumab today. 8.   RTC tomorrow for Fulphila. 9.   RTC in 10 days for CBC with diff. 10.   RTC in 3 weeks for MD assessment, labs (CBC with diff, CMP, Mg), and cycle #15 Alimta and pembrolizumab.  I discussed the assessment and treatment plan with the patient.  The patient was provided an opportunity to ask questions and all were answered.  The patient agreed with the plan and demonstrated an understanding of the instructions.  The patient was advised to call back if the symptoms worsen or if the condition fails to improve as anticipated.    Lequita Asal, MD, PhD 01/18/2020, 9:21 AM  I, De Burrs, am acting as scribe for [employer]. Mike Gip, MD, PhD.  I, Hamdan Toscano C. Mike Gip, MD, have reviewed the above documentation for accuracy and completeness, and I agree with the above.

## 2020-01-17 ENCOUNTER — Other Ambulatory Visit: Payer: Self-pay

## 2020-01-17 NOTE — Progress Notes (Signed)
Shoulder/underarm pain improved.

## 2020-01-18 ENCOUNTER — Encounter: Payer: Self-pay | Admitting: Hematology and Oncology

## 2020-01-18 ENCOUNTER — Inpatient Hospital Stay: Payer: Medicare Other

## 2020-01-18 ENCOUNTER — Inpatient Hospital Stay (HOSPITAL_BASED_OUTPATIENT_CLINIC_OR_DEPARTMENT_OTHER): Payer: Medicare Other | Admitting: Hematology and Oncology

## 2020-01-18 VITALS — BP 101/59 | HR 77 | Temp 97.9°F | Resp 18 | Ht 66.0 in | Wt 131.2 lb

## 2020-01-18 VITALS — BP 102/63 | HR 61 | Temp 97.1°F | Resp 16

## 2020-01-18 DIAGNOSIS — Z5112 Encounter for antineoplastic immunotherapy: Secondary | ICD-10-CM

## 2020-01-18 DIAGNOSIS — Z5111 Encounter for antineoplastic chemotherapy: Secondary | ICD-10-CM | POA: Diagnosis not present

## 2020-01-18 DIAGNOSIS — I2782 Chronic pulmonary embolism: Secondary | ICD-10-CM

## 2020-01-18 DIAGNOSIS — D6481 Anemia due to antineoplastic chemotherapy: Secondary | ICD-10-CM

## 2020-01-18 DIAGNOSIS — Z7189 Other specified counseling: Secondary | ICD-10-CM

## 2020-01-18 DIAGNOSIS — C7951 Secondary malignant neoplasm of bone: Secondary | ICD-10-CM | POA: Diagnosis not present

## 2020-01-18 DIAGNOSIS — F329 Major depressive disorder, single episode, unspecified: Secondary | ICD-10-CM

## 2020-01-18 DIAGNOSIS — C3431 Malignant neoplasm of lower lobe, right bronchus or lung: Secondary | ICD-10-CM

## 2020-01-18 DIAGNOSIS — G893 Neoplasm related pain (acute) (chronic): Secondary | ICD-10-CM

## 2020-01-18 DIAGNOSIS — T451X5A Adverse effect of antineoplastic and immunosuppressive drugs, initial encounter: Secondary | ICD-10-CM

## 2020-01-18 DIAGNOSIS — M898X1 Other specified disorders of bone, shoulder: Secondary | ICD-10-CM

## 2020-01-18 LAB — CBC WITH DIFFERENTIAL/PLATELET
Abs Immature Granulocytes: 0.02 10*3/uL (ref 0.00–0.07)
Basophils Absolute: 0.1 10*3/uL (ref 0.0–0.1)
Basophils Relative: 1 %
Eosinophils Absolute: 0.5 10*3/uL (ref 0.0–0.5)
Eosinophils Relative: 7 %
HCT: 28.2 % — ABNORMAL LOW (ref 39.0–52.0)
Hemoglobin: 8.7 g/dL — ABNORMAL LOW (ref 13.0–17.0)
Immature Granulocytes: 0 %
Lymphocytes Relative: 12 %
Lymphs Abs: 0.7 10*3/uL (ref 0.7–4.0)
MCH: 29.3 pg (ref 26.0–34.0)
MCHC: 30.9 g/dL (ref 30.0–36.0)
MCV: 94.9 fL (ref 80.0–100.0)
Monocytes Absolute: 0.6 10*3/uL (ref 0.1–1.0)
Monocytes Relative: 10 %
Neutro Abs: 4.3 10*3/uL (ref 1.7–7.7)
Neutrophils Relative %: 70 %
Platelets: 252 10*3/uL (ref 150–400)
RBC: 2.97 MIL/uL — ABNORMAL LOW (ref 4.22–5.81)
RDW: 16.6 % — ABNORMAL HIGH (ref 11.5–15.5)
WBC: 6.1 10*3/uL (ref 4.0–10.5)
nRBC: 0 % (ref 0.0–0.2)

## 2020-01-18 LAB — COMPREHENSIVE METABOLIC PANEL
ALT: 13 U/L (ref 0–44)
AST: 26 U/L (ref 15–41)
Albumin: 3.1 g/dL — ABNORMAL LOW (ref 3.5–5.0)
Alkaline Phosphatase: 77 U/L (ref 38–126)
Anion gap: 8 (ref 5–15)
BUN: 9 mg/dL (ref 8–23)
CO2: 26 mmol/L (ref 22–32)
Calcium: 8.4 mg/dL — ABNORMAL LOW (ref 8.9–10.3)
Chloride: 104 mmol/L (ref 98–111)
Creatinine, Ser: 1.24 mg/dL (ref 0.61–1.24)
GFR calc Af Amer: >60 mL/min (ref >60)
GFR calc non Af Amer: 59 mL/min — ABNORMAL LOW (ref >60)
Glucose, Bld: 130 mg/dL — ABNORMAL HIGH (ref 70–99)
Potassium: 3.8 mmol/L (ref 3.5–5.1)
Sodium: 138 mmol/L (ref 135–145)
Total Bilirubin: 0.6 mg/dL (ref 0.3–1.2)
Total Protein: 6.5 g/dL (ref 6.5–8.1)

## 2020-01-18 MED ORDER — SODIUM CHLORIDE 0.9 % IV SOLN
200.0000 mg | Freq: Once | INTRAVENOUS | Status: AC
  Administered 2020-01-18: 200 mg via INTRAVENOUS
  Filled 2020-01-18: qty 8

## 2020-01-18 MED ORDER — CYANOCOBALAMIN 1000 MCG/ML IJ SOLN
1000.0000 ug | Freq: Once | INTRAMUSCULAR | Status: AC
  Administered 2020-01-18: 1000 ug via INTRAMUSCULAR

## 2020-01-18 MED ORDER — HEPARIN SOD (PORK) LOCK FLUSH 100 UNIT/ML IV SOLN
500.0000 [IU] | Freq: Once | INTRAVENOUS | Status: AC
  Administered 2020-01-18: 500 [IU] via INTRAVENOUS
  Filled 2020-01-18: qty 5

## 2020-01-18 MED ORDER — EPOETIN ALFA-EPBX 10000 UNIT/ML IJ SOLN
10000.0000 [IU] | Freq: Once | INTRAMUSCULAR | Status: AC
  Administered 2020-01-18: 10000 [IU] via SUBCUTANEOUS
  Filled 2020-01-18: qty 1

## 2020-01-18 MED ORDER — FILGRASTIM-SNDZ 300 MCG/0.5ML IJ SOSY
300.0000 ug | PREFILLED_SYRINGE | Freq: Once | INTRAMUSCULAR | Status: DC
  Filled 2020-01-18: qty 0.5

## 2020-01-18 MED ORDER — SODIUM CHLORIDE 0.9 % IV SOLN
400.0000 mg/m2 | Freq: Once | INTRAVENOUS | Status: AC
  Administered 2020-01-18: 700 mg via INTRAVENOUS
  Filled 2020-01-18: qty 8

## 2020-01-18 MED ORDER — SODIUM CHLORIDE 0.9 % IV SOLN
Freq: Once | INTRAVENOUS | Status: AC
  Filled 2020-01-18: qty 250

## 2020-01-18 MED ORDER — PALONOSETRON HCL INJECTION 0.25 MG/5ML
0.2500 mg | Freq: Once | INTRAVENOUS | Status: AC
  Administered 2020-01-18: 0.25 mg via INTRAVENOUS
  Filled 2020-01-18: qty 5

## 2020-01-18 MED ORDER — SODIUM CHLORIDE 0.9% FLUSH
10.0000 mL | INTRAVENOUS | Status: DC | PRN
  Administered 2020-01-18: 10 mL via INTRAVENOUS
  Filled 2020-01-18: qty 10

## 2020-01-18 NOTE — Progress Notes (Signed)
Zarxio changed to [place] for 01/19/20

## 2020-01-19 ENCOUNTER — Other Ambulatory Visit: Payer: Self-pay | Admitting: *Deleted

## 2020-01-19 ENCOUNTER — Other Ambulatory Visit: Payer: Self-pay

## 2020-01-19 ENCOUNTER — Inpatient Hospital Stay: Payer: Medicare Other | Attending: Hematology and Oncology

## 2020-01-19 DIAGNOSIS — F419 Anxiety disorder, unspecified: Secondary | ICD-10-CM | POA: Insufficient documentation

## 2020-01-19 DIAGNOSIS — Z5111 Encounter for antineoplastic chemotherapy: Secondary | ICD-10-CM | POA: Insufficient documentation

## 2020-01-19 DIAGNOSIS — Z79899 Other long term (current) drug therapy: Secondary | ICD-10-CM | POA: Diagnosis not present

## 2020-01-19 DIAGNOSIS — G893 Neoplasm related pain (acute) (chronic): Secondary | ICD-10-CM | POA: Diagnosis not present

## 2020-01-19 DIAGNOSIS — Z7901 Long term (current) use of anticoagulants: Secondary | ICD-10-CM | POA: Insufficient documentation

## 2020-01-19 DIAGNOSIS — D72819 Decreased white blood cell count, unspecified: Secondary | ICD-10-CM | POA: Insufficient documentation

## 2020-01-19 DIAGNOSIS — D631 Anemia in chronic kidney disease: Secondary | ICD-10-CM | POA: Insufficient documentation

## 2020-01-19 DIAGNOSIS — Z86718 Personal history of other venous thrombosis and embolism: Secondary | ICD-10-CM | POA: Insufficient documentation

## 2020-01-19 DIAGNOSIS — C3431 Malignant neoplasm of lower lobe, right bronchus or lung: Secondary | ICD-10-CM

## 2020-01-19 DIAGNOSIS — T451X5A Adverse effect of antineoplastic and immunosuppressive drugs, initial encounter: Secondary | ICD-10-CM | POA: Diagnosis not present

## 2020-01-19 DIAGNOSIS — D709 Neutropenia, unspecified: Secondary | ICD-10-CM

## 2020-01-19 DIAGNOSIS — C7951 Secondary malignant neoplasm of bone: Secondary | ICD-10-CM | POA: Insufficient documentation

## 2020-01-19 DIAGNOSIS — E538 Deficiency of other specified B group vitamins: Secondary | ICD-10-CM | POA: Insufficient documentation

## 2020-01-19 MED ORDER — PEGFILGRASTIM-JMDB 6 MG/0.6ML ~~LOC~~ SOSY
6.0000 mg | PREFILLED_SYRINGE | Freq: Once | SUBCUTANEOUS | Status: AC
  Administered 2020-01-19: 6 mg via SUBCUTANEOUS

## 2020-01-19 MED ORDER — PEGFILGRASTIM-JMDB 6 MG/0.6ML ~~LOC~~ SOSY: 6.0000 mg | PREFILLED_SYRINGE | Freq: Once | SUBCUTANEOUS | Status: DC

## 2020-01-27 ENCOUNTER — Inpatient Hospital Stay: Payer: Medicare Other

## 2020-01-27 DIAGNOSIS — D6481 Anemia due to antineoplastic chemotherapy: Secondary | ICD-10-CM

## 2020-01-27 DIAGNOSIS — Z5111 Encounter for antineoplastic chemotherapy: Secondary | ICD-10-CM | POA: Diagnosis not present

## 2020-01-27 DIAGNOSIS — D72819 Decreased white blood cell count, unspecified: Secondary | ICD-10-CM | POA: Diagnosis not present

## 2020-01-27 DIAGNOSIS — C7951 Secondary malignant neoplasm of bone: Secondary | ICD-10-CM | POA: Diagnosis not present

## 2020-01-27 DIAGNOSIS — C3431 Malignant neoplasm of lower lobe, right bronchus or lung: Secondary | ICD-10-CM | POA: Diagnosis not present

## 2020-01-27 DIAGNOSIS — I2782 Chronic pulmonary embolism: Secondary | ICD-10-CM

## 2020-01-27 DIAGNOSIS — F419 Anxiety disorder, unspecified: Secondary | ICD-10-CM | POA: Diagnosis not present

## 2020-01-27 DIAGNOSIS — G893 Neoplasm related pain (acute) (chronic): Secondary | ICD-10-CM | POA: Diagnosis not present

## 2020-01-27 LAB — CBC WITH DIFFERENTIAL/PLATELET
Abs Immature Granulocytes: 0.38 10*3/uL — ABNORMAL HIGH (ref 0.00–0.07)
Basophils Absolute: 0.1 10*3/uL (ref 0.0–0.1)
Basophils Relative: 0 %
Eosinophils Absolute: 0.3 10*3/uL (ref 0.0–0.5)
Eosinophils Relative: 1 %
HCT: 26.5 % — ABNORMAL LOW (ref 39.0–52.0)
Hemoglobin: 8.5 g/dL — ABNORMAL LOW (ref 13.0–17.0)
Immature Granulocytes: 2 %
Lymphocytes Relative: 6 %
Lymphs Abs: 1 10*3/uL (ref 0.7–4.0)
MCH: 29.7 pg (ref 26.0–34.0)
MCHC: 32.1 g/dL (ref 30.0–36.0)
MCV: 92.7 fL (ref 80.0–100.0)
Monocytes Absolute: 1.2 10*3/uL — ABNORMAL HIGH (ref 0.1–1.0)
Monocytes Relative: 7 %
Neutro Abs: 15.7 10*3/uL — ABNORMAL HIGH (ref 1.7–7.7)
Neutrophils Relative %: 84 %
Platelets: 171 10*3/uL (ref 150–400)
RBC: 2.86 MIL/uL — ABNORMAL LOW (ref 4.22–5.81)
RDW: 16.1 % — ABNORMAL HIGH (ref 11.5–15.5)
WBC: 18.7 10*3/uL — ABNORMAL HIGH (ref 4.0–10.5)
nRBC: 0 % (ref 0.0–0.2)

## 2020-01-30 ENCOUNTER — Inpatient Hospital Stay: Payer: Medicare Other | Attending: Hospice and Palliative Medicine | Admitting: Hospice and Palliative Medicine

## 2020-01-30 DIAGNOSIS — F419 Anxiety disorder, unspecified: Secondary | ICD-10-CM

## 2020-01-30 DIAGNOSIS — G893 Neoplasm related pain (acute) (chronic): Secondary | ICD-10-CM

## 2020-01-30 MED ORDER — FLUOXETINE HCL 40 MG PO CAPS: 40.0000 mg | ORAL_CAPSULE | Freq: Every day | ORAL | 3 refills | Status: DC

## 2020-01-30 MED ORDER — OXYCODONE HCL 20 MG PO TABS: ORAL_TABLET | ORAL | 0 refills | Status: DC

## 2020-01-30 MED ORDER — ALPRAZOLAM 0.5 MG PO TABS: 0.5000 mg | ORAL_TABLET | Freq: Four times a day (QID) | ORAL | 0 refills | Status: DC | PRN

## 2020-01-30 NOTE — Progress Notes (Signed)
Virtual Visit via Telephone Note  I connected with Jimmy West on 01/30/20 at  9:30 AM EDT by telephone and verified that I am speaking with the correct person using two identifiers.   I discussed the limitations, risks, security and privacy concerns of performing an evaluation and management service by telephone and the availability of in person appointments. I also discussed with the patient that there may be a patient responsible charge related to this service. The patient expressed understanding and agreed to proceed.   History of Present Illness: Mr. 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 Patient has had chronic neoplasm related pain and severe anxiety/depression.  He was referred to palliative care clinic to assist with symptom management and to establish treatment goals.   Observations/Objective: Was unable to reach patient by video so I called him instead.  He reports that he is doing reasonably well.  He denies significant changes or concerns.  He says pain is stable as long as he takes about 3-4 of oxycodone each day.  He also reports slightly improved moods on fluoxetine/alprazolam.  He denies SI/HI.  He is scheduled to see psychiatry next month.  Patient does request refills on fluoxetine, alprazolam, and oxycodone.  PDMP reviewed.  Patient says that he has considered stopping treatment and focusing more on comfort.  However, he says he has not made up his mind yet regarding any decisions.  Assessment and Plan: Stage IV lung cancer -on treatment immunotherapy  Neoplasm related pain -continue oxycodone as needed (#60)  Anxiety -continue alprazolam as needed (#60)  Depression -continue Prozac.  Pending psychiatry appointment   Follow Up Instructions: Follow-up virtual visit about a month   I discussed the assessment and treatment plan with the patient. The patient was  provided an opportunity to ask questions and all were answered. The patient agreed with the plan and demonstrated an understanding of the instructions.   The patient was advised to call back or seek an in-person evaluation if the symptoms worsen or if the condition fails to improve as anticipated.  I provided 15 minutes of non-face-to-face time during this encounter.   Irean Hong, NP

## 2020-02-07 NOTE — Progress Notes (Signed)
[facility]  [address] [place], [place] [address] Phone: [phone]  Fax: [phone]   Clinic Day:  02/08/2020  Referring physician: [name], NP  Chief Complaint: Jimmy West is a 69 y.o. male with metastatic adenocarcinoma of the lung who is seen for assessment prior to cycle #15 Alimta and pembrolizumab.   HPI: The patient was last seen in the medical oncology clinic on 01/18/2020. At that time, he was doing well.  Exam was stable. He received cycle #14 Alimta and pembrolizumab.  He received  Fulphila the following day secondary to documentation of neutropenia with prior cycles.    Counts on 01/27/2020 included a hematocrit of 26.5, hemoglobin 8.5, MCV 92.7, platelets 171,000, WBC 18,700 with an ANC of 15,700.  He met with [name], NP on 01/30/2020 for symptom management and to establish treatment goals. He was to continue oxycodone and alprazolam as needed, and discontinue Prozac pending psychiatry appointment. He will follow up again in one month.   During the interim, he has been ok overall. He denies any respiratory symptoms. He denies any bone pain associated with Fulphila with his last cycle of chemotherapy.  He describes his mood as "better". Psychiatry appointment has been scheduled for 02/23/2020.  He has wanted to play some golf again, but he notes that he doesn't have anyone to play with anymore. He's also worried about how far he'd actually be able to hit the ball with his weight that has slowly decreased.   Past Medical History:  Diagnosis Date  . Bulging lumbar disc   . Cancer ([facility])    stage 4 lung cancer  . Heart attack [facility])     Past Surgical History:  Procedure Laterality Date  . CARDIAC CATHETERIZATION     two stents  . KNEE SURGERY Left   . PORTA CATH INSERTION N/A 05/21/2018   Procedure: PORTA CATH INSERTION;  Surgeon: Algernon Huxley, MD;  Location: [facility];  Service:  Cardiovascular;  Laterality: N/A;    Family History  Problem Relation Age of Onset  . Heart failure Father   . Cancer Maternal Aunt   . Heart failure Maternal Uncle   . Dementia Paternal Grandmother   . Cancer Maternal Aunt     Social History:  reports that he quit smoking about 16 years ago. His smoking use included cigarettes. He has a 22.50 pack-year smoking history. He has never used smokeless tobacco. He reports previous alcohol use. He reports that he does not use drugs. He started smoking at age 16. He is smoking 1 1/2 packs/day. Patient denies known exposures to radiation ortoxins. 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 He has not been working recently as the salon was closed. He plays golf occasionally (none recently).He has 8 cats. The patient is alone today.   Allergies: No Known Allergies  Current Medications: Current Outpatient Medications  Medication Sig Dispense Refill  . albuterol (VENTOLIN HFA) 108 (90 Base) MCG/ACT inhaler Inhale 2 puffs into the lungs every 6 (six) hours as needed for wheezing or shortness of breath. 18 g 3  . ALPRAZolam (XANAX) 0.5 MG tablet Take 1 tablet (0.5 mg total) by mouth 4 (four) times daily as needed for anxiety. 60 tablet 0  . Calcium 600-400 MG-UNIT CHEW Chew 2 tablets by mouth daily.    Marland Kitchen dexamethasone (DECADRON) 4 MG tablet Take 1 tab two times a day the day before Alimta chemo, then take  2 tabs once a day for 3 days starting the day after chemo. 30 tablet 1  . ELIQUIS 5 MG TABS tablet TAKE 1 TABLET BY MOUTH TWICE DAILY 60 tablet 5  . FLUoxetine (PROZAC) 40 MG capsule Take 1 capsule (40 mg total) by mouth daily. 30 capsule 3  . folic acid (FOLVITE) 1 MG tablet Take 1 tablet (1 mg total) by mouth daily. Continue until 21 days after Alimta completed. 100 tablet 3  . ondansetron (ZOFRAN ODT) 8 MG disintegrating tablet Take 1 tablet (8 mg total) by mouth every 8 (eight) hours as needed for nausea or vomiting. 20  tablet 1  . Oxycodone HCl 20 MG TABS TAKE 1/2 TO 1 TABLET BY MOUTH EVERY 4 HOURS AS NEEDED FOR SEVERE PAIN 60 tablet 0  . polyethylene glycol (MIRALAX / GLYCOLAX) packet Take 17 g by mouth daily.    Marland Kitchen senna (SENOKOT) 8.6 MG tablet Take 1 tablet by mouth as needed.     Marland Kitchen acetaminophen (TYLENOL) 500 MG tablet Take 500 mg by mouth 3 (three) times daily as needed. (Patient not taking: Reported on 02/08/2020)     No current facility-administered medications for this visit.   Facility-Administered Medications Ordered in Other Visits  Medication Dose Route Frequency Provider Last Rate Last Admin  . 0.9 %  sodium chloride infusion   Intravenous Once Corcoran, Melissa C, MD      . 0.9 %  sodium chloride infusion   Intravenous Once PRN Corcoran, Melissa C, MD      . albuterol (PROVENTIL) (2.5 MG/3ML) 0.083% nebulizer solution 2.5 mg  2.5 mg Nebulization Once PRN Corcoran, Melissa C, MD      . alteplase (CATHFLO ACTIVASE) injection 2 mg  2 mg Intracatheter Once PRN Lequita Asal, MD      . EPINEPHrine (ADRENALIN) 1 MG/10ML injection 0.25 mg  0.25 mg Intravenous Once PRN Corcoran, Melissa C, MD      . EPINEPHrine (ADRENALIN) 1 MG/10ML injection 0.25 mg  0.25 mg Intravenous Once PRN Corcoran, Melissa C, MD      . heparin lock flush 100 unit/mL  500 Units Intracatheter Once PRN Nolon Stalls C, MD      . heparin lock flush 100 unit/mL  250 Units Intracatheter Once PRN Nolon Stalls C, MD      . heparin lock flush 100 unit/mL  500 Units Intravenous Once Corcoran, Melissa C, MD      . sodium chloride flush (NS) 0.9 % injection 10 mL  10 mL Intravenous PRN Nolon Stalls C, MD   10 mL at 03/04/19 0959  . sodium chloride flush (NS) 0.9 % injection 10 mL  10 mL Intracatheter Once PRN Nolon Stalls C, MD      . sodium chloride flush (NS) 0.9 % injection 10 mL  10 mL Intravenous PRN Lequita Asal, MD   10 mL at 02/08/20 0905  . sodium chloride flush (NS) 0.9 % injection 3 mL  3 mL  Intracatheter Once PRN Lequita Asal, MD        Review of Systems  Constitutional: Positive for weight loss (2 lbs). Negative for chills, diaphoresis, fever and malaise/fatigue.       Feels good.  HENT: Negative for congestion, ear discharge, ear pain, hearing loss, nosebleeds, sinus pain, sore throat and tinnitus.   Eyes: Negative for blurred vision and double vision.  Respiratory: Negative for cough (chronic; better), hemoptysis, sputum production and shortness of breath.   Cardiovascular: Negative for chest pain,  palpitations and leg swelling.  Gastrointestinal: Negative for abdominal pain, blood in stool, constipation, diarrhea, heartburn, melena, nausea and vomiting.       Appetite increased  Genitourinary: Negative for dysuria, frequency and urgency.  Musculoskeletal: Negative for back pain, falls, joint pain, myalgias and neck pain.  Skin: Negative for rash.  Neurological: Negative for dizziness, sensory change, focal weakness, weakness and headaches.  Endo/Heme/Allergies: Positive for environmental allergies. Negative for polydipsia. Does not bruise/bleed easily.  Psychiatric/Behavioral: Positive for depression (improved). Negative for memory loss. The patient is nervous/anxious (improved with Xanax). The patient does not have insomnia (on OTC medication).   All other systems reviewed and are negative.  Performance status (ECOG): 1 - Symptomatic but completely ambulatory   Vitals Blood pressure 110/72, pulse 70, temperature (!) 97 F (36.1 C), temperature source Tympanic, weight 129 lb 4.8 oz (58.7 kg), SpO2 99 %.   Physical Exam Vitals and nursing note reviewed.  Constitutional:      General: He is not in acute distress.    Appearance: Normal appearance. He is well-developed. He is not diaphoretic.     Interventions: Face mask in place.  HENT:     Head: Normocephalic and atraumatic.     Comments: Thin brown graying hair. Mustache.    Right Ear: Hearing normal.      Mouth/Throat:     Mouth: Mucous membranes are moist. No oral lesions.     Pharynx: Oropharynx is clear.     Comments: Dentures. Eyes:     General: No scleral icterus.    Conjunctiva/sclera: Conjunctivae normal.     Pupils: Pupils are equal, round, and reactive to light.     Comments: Glasses.  Gray/blue eyes.  Neck:     Vascular: No JVD.  Cardiovascular:     Rate and Rhythm: Normal rate and regular rhythm.     Pulses: Normal pulses.     Heart sounds: Normal heart sounds. No murmur heard.  No friction rub. No gallop.   Pulmonary:     Effort: Pulmonary effort is normal. No respiratory distress.     Breath sounds: Normal breath sounds. No wheezing, rhonchi or rales.  Chest:     Chest wall: No tenderness.  Abdominal:     General: Bowel sounds are normal. There is no distension.     Palpations: Abdomen is soft. There is no mass.     Tenderness: There is no abdominal tenderness. There is no guarding or rebound.  Musculoskeletal:        General: No swelling or tenderness. Normal range of motion.     Cervical back: Normal range of motion and neck supple.  Lymphadenopathy:     Head:     Right side of head: No preauricular, posterior auricular or occipital adenopathy.     Left side of head: No preauricular, posterior auricular or occipital adenopathy.     Cervical: No cervical adenopathy.     Upper Body:     Right upper body: No supraclavicular or axillary adenopathy.     Left upper body: No supraclavicular or axillary adenopathy.     Lower Body: No right inguinal adenopathy. No left inguinal adenopathy.  Skin:    General: Skin is warm and dry.     Coloration: Skin is not pale.     Findings: No bruising, erythema, lesion or rash.  Neurological:     Mental Status: He is alert and oriented to person, place, and time. Mental status is at baseline.  Psychiatric:  Behavior: Behavior normal.        Thought Content: Thought content normal.        Judgment: Judgment normal.     Imaging studies: 04/30/2018:  PET scanrevealed a 3.4 cm hypermetabolic RLL pulmonary mass (SUV 13.2), 11 mm pulmonary nodule in the LEFT lung (SUV 4.5), RIGHT paratracheal lymph node (SUV 5.1), and hypermetabolic activity within the T4 vertebral body (SUV 10.2).  05/03/2018:  Thoracic spine MRIon 05/03/2018 revealed T4 metastasiswith a 40% pathologic compression deformity and 5 mm of retropulsion of the vertebral body. Retropulsion results in mild spinal canal stenosis and mild bilateral C4-5 foraminal stenosis. There was paravertebral soft tissue thickening from mid T3 to mid T5, likely representing edema related to the pathologic compression deformity vs. possible extraosseous extension of the neoplasm. There were no additional thoracic spinal metastases noted.  05/03/2018:  Head MRIrevealed no intracranial metastatic disease. 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 Incidental mention made of mild paranasal sinus disease. 01/10/2019:  Cervical and thoracic spine CT at [facility] unchanged severe compression deformity/vertebral plana of T4 with a stable degree of retropulsion and focal mild spinal canal stenosis. There was interval enlargement of a right lower lobe pulmonary mass(4.5 x 3.5 cm compared to 3.9 x 2.9 cm)with redemonstrated spiculated pulmonary nodules. 01/28/2019:  Chest, abdomen, pelvisCTrevealed slight interval enlargement of a right lower lobe mass with central necrosis measuring 4.6 x 3.4 cm, previously 4.0 x 3.0 cm. There was no change in right upper lobe nodules(1.4 and 0.8 cm).Therewas almost no residua of left lung nodules, with irregular opacities in the apical left upper lobeandsuperior segment left lower lobe. There was no change in right hilar soft tissue and lymph nodes.There wasvertebra  plana deformity of T4andno evidence of new osseous metastatic disease.There was no evidence of distant metastatic disease in the abdomen or pelvis.The1.6 x 1.1 cmleft adrenal nodule(non-metabolic on prior PET scan),was unchanged. 03/24/2019:  Renal ultrasoundrevealed no acute abnormality identified.There was no hydronephrosis or bladder distention. 04/23/2019:  Abdomen and pelvis CT revealed no acute abdominal or pelvic pathology. There was fluid in the colon which can be seen with diarrhea. There was diverticulosis without evidence of diverticulitis. There was a stable 1.5 cm left adrenal nodule. 05/06/2019:  Chest, abdomen, and pelvisCTrevealed a positive response to interval therapy for the known right lower lobe lung carcinoma(4.6 x 3.4 cm to3.1 x 2.8 cm transversely). There hadbeen a mild interval decrease contiguous soft tissue that extends along the right hilum. The two right upper lobe noduleswerestable from the most recent prior study (smaller than 04/27/2018).There are no new lung nodules.Therewasno evidence of new metastatic disease.There was stable severe compression deformity/vertebra plana of T4.Therewasno evidence of other osseous metastatic disease.There was a stable left adrenal nodule consistent with an adenoma. 05/30/2019:  ChestCT angiogramrevealed a tiny subsegmental pulmonary embolusin the right lower lobe. There areas of ground-glass at the periphery of the right chest hadbecome more conspicuous. This mayrelate to mild pneumonitis, perhaps due to radiation, attention on follow-up.The dominant nodule in the right lung basewasstable in size. Heis onEliquis. 08/14/2019:  Cervical spine MRIrevealed no evidence of metastatic disease within the cervical spine. 09/16/2019:  Chest, abdomen and pelvis CTrevealed asimilar-appearing spiculated right lower lobe nodule.There was no evidence for metastatic disease in the abdomen or pelvis.There was  stable left adrenal adenoma. 12/15/2019:  Chest CT revealed an enlarging right lower lobe  mass c/w malignancy (3.0 x 2.4 cm to 3.3 x 2.7 cm).  There was slight enlargement in a spiculated right upper lobe subpleural nodule (0.8 x 1.5 cm to 1.2 x 0.7 cm). 01/16/2020:  Bone scan revealed no scintigraphic evidence of osseous metastatic disease.   Infusion on 02/08/2020  Component Date Value Ref Range Status  . Magnesium 02/08/2020 1.9  1.7 - 2.4 mg/dL Final   Performed at [facility], [address] [place], [place] [address]  . WBC 02/08/2020 9.9  4.0 - 10.5 K/uL Final  . RBC 02/08/2020 3.27* 4.22 - 5.81 MIL/uL Final  . Hemoglobin 02/08/2020 9.6* 13.0 - 17.0 g/dL Final  . HCT 02/08/2020 31.4* 39 - 52 % Final  . MCV 02/08/2020 96.0  80.0 - 100.0 fL Final  . MCH 02/08/2020 29.4  26.0 - 34.0 pg Final  . MCHC 02/08/2020 30.6  30.0 - 36.0 g/dL Final  . RDW 02/08/2020 16.0* 11.5 - 15.5 % Final  . Platelets 02/08/2020 355  150 - 400 K/uL Final  . nRBC 02/08/2020 0.0  0.0 - 0.2 % Final  . Neutrophils Relative % 02/08/2020 73  % Final  . Neutro Abs 02/08/2020 7.3  1.7 - 7.7 K/uL Final  . Lymphocytes Relative 02/08/2020 12  % Final  . Lymphs Abs 02/08/2020 1.2  0.7 - 4.0 K/uL Final  . Monocytes Relative 02/08/2020 11  % Final  . Monocytes Absolute 02/08/2020 1.1* 0 - 1 K/uL Final  . Eosinophils Relative 02/08/2020 2  % Final  . Eosinophils Absolute 02/08/2020 0.2  0 - 0 K/uL Final  . Basophils Relative 02/08/2020 1  % Final  . Basophils Absolute 02/08/2020 0.1  0 - 0 K/uL Final  . Immature Granulocytes 02/08/2020 1  % Final  . Abs Immature Granulocytes 02/08/2020 0.07  0.00 - 0.07 K/uL Final   Performed at [facility], [address], [place], [place] [address]  . Sodium 02/08/2020 138  135 - 145 mmol/L Final  . Potassium 02/08/2020 4.0  3.5 - 5.1 mmol/L Final  . Chloride 02/08/2020 102  98 - 111 mmol/L Final  . CO2 02/08/2020 28  22 - 32 mmol/L Final  . Glucose,  Bld 02/08/2020 113* 70 - 99 mg/dL Final   Glucose reference range applies only to samples taken after fasting for at least 8 hours.  . BUN 02/08/2020 13  8 - 23 mg/dL Final  . Creatinine, Ser 02/08/2020 1.18  0.61 - 1.24 mg/dL Final  . Calcium 02/08/2020 9.3  8.9 - 10.3 mg/dL Final  . Total Protein 02/08/2020 6.9  6.5 - 8.1 g/dL Final  . Albumin 02/08/2020 3.4* 3.5 - 5.0 g/dL Final  . AST 02/08/2020 26  15 - 41 U/L Final  . ALT 02/08/2020 16  0 - 44 U/L Final  . Alkaline Phosphatase 02/08/2020 84  38 - 126 U/L Final  . Total Bilirubin 02/08/2020 0.6  0.3 - 1.2 mg/dL Final  . GFR calc non Af Amer 02/08/2020 >60  >60 mL/min Final  . GFR calc Af Amer 02/08/2020 >60  >60 mL/min Final  . Anion gap 02/08/2020 8  5 - 15 Final   Performed at [facility], [address] [place],  [place] [address]    Assessment:  Nesta Kimple is a 69 y.o. male with metastatichigh-grade adenocarcinomaof the right lungs/p CT-guided biopsy of a RLL lung mass on 05/11/2018. Pathologyrevealed an invasive high-grade adenocarcinoma, with predominantly solid growth pattern. The neoplastic cells were  TTF-1 (+), Napsin A (+), and P40 (+). He has a T4 vertebral metastasis. Clinical stage is T4N1M1.  There was not enough material for [facility] One testing. PDL-1revealed TPS 90%.  PET scanon 04/30/2018 revealed a 3.4 cm hypermetabolic RLL pulmonary mass (SUV 13.2), 11 mm pulmonary nodule in the LEFT lung (SUV 4.5), RIGHT paratracheal lymph node (SUV 5.1), and hypermetabolic activity within the T4 vertebral body (SUV 10.2).   Thoracic spine MRIon 05/03/2018 revealed T4 metastasiswith a 40% pathologic compression deformity and 5 mm of retropulsion of the vertebral body. Retropulsion results in mild spinal canal stenosis and mild bilateral C4-5 foraminal stenosis. There was paravertebral soft tissue thickening from mid T3 to mid T5, likely representing edema related to the pathologic  compression deformity vs. possible extraosseous extension of the neoplasm. There were no additional thoracic spinal metastases noted.   Head MRIon 05/03/2018 revealed no intracranial metastatic disease. 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 Incidental mention made of mild paranasal sinus disease.  Hereceived 11cycles ofpembrolizumab(05/24/2018 - 02/07/2019). He toleratedtreatment well. CEAwas 1.3 on 08/30/2018.LDHwas 165 on 11/29/2018.  He completed T4 radiationon 07/07/2018. He receives Xgevamonthly (06/03/2018 -11/01/2019).  He received 1 cycle of carboplatin, Alimta, and pembrolizumabon 02/08/2019. Cycle #1 was complicated by nausea, vomiting, and diarrhea necessitating hospitalization.Decision made to hold carboplatin with his second dose.He iss/p2cycles ofAlimta and pembrolizumab (02/28/2019 - 03/29/2019).Cycle #2 was complicated by nausea, vomiting, and dehydration requring fluids in clinic and an overnight stay in the hospital. Hereceivedcarboplatin (AUC 2), Alimta, and pebrolizumab(04/18/2019).He tolerated it poorly.   Hehasreceived14 cycles ofAlimta and pembrolizumabon (05/09/2019 -01/18/2020). He began Fulphila with cycle #14 secondary to neutropenia.  He has tolerated chemotherapy well. He receives B12every 9 weeks (last06/30/2021).  He hascancer-related painin T4. He is off the [employer].  He is taking oxycodone10-32m every 4 hours prn.  Chest, abdomen and pelvis CTon 09/16/2019 revealed asimilar-appearing spiculated right lower lobe nodule.There was no evidence for metastatic disease in the abdomen or pelvis.There was stable left adrenal adenoma.  Chest CT on 12/15/2019 revealed an enlarging right lower lobe mass c/w malignancy (3.0 x 2.4 cm to  3.3 x 2.7 cm).  There was slight enlargement in a spiculated right upper lobe subpleural nodule (0.8 x 1.5 cm to 1.2 x 0.7 cm).  Bone scan on 01/16/2020 revealed no scintigraphic evidence of osseous metastatic disease.  He has cervicalgia.Cervical spine MRIon 08/14/2019 showed no evidence of metastatic disease within the cervical spine. Heis followedby Dr YIzora Ribas  He has chemotherapy induced anemia.  He received Retacrit on 11/01/2019 (last 01/18/2020).  Hehad transient renal insufficiencyon 03/23/2019. Creatinine was 1.32 (baseline 0.61-1.0). Renal ultrasoundon 03/24/2019 revealed no acute abnormality identified.There was no hydronephrosis or bladder distention.Creatinine is 0.82 today.  Stool waspositive for C difficile + diarrheaon 06/14/2019.Hewas treated with oral vancomycin.  He received the influenza vaccineon 05/16/2019.   Symptomatically, he denies any respiratory symptoms. Mood has improved. Exam is stable.  Plan: 1.   Labs:  CBC with diff, CMP, Mg 2. Metastatic high-grade adenocarcinoma the RIGHT lung He is s/p 11 cycles of pembrolizumab. He is s/p 1 cycle of carboplatin, Almita, and pembrolizumab (02/08/2019). He is s/p 1 cycle of carboplatin (AUC 2), Alimta, and pembrolizumab (04/18/2019). He is s/p14cycles of Alimta and pembrolizumab (02/28/2019 - 03/29/2019; 05/09/2019-01/18/2020). Restaging CT scans on 05/27/2021revealed slight growth (3-4 mm). Labs reviewed. Cycle #15 Alimta and pembrolizumab today.  Patient confirms that he has continued his daily folic acid and took his Decadron premedication.  Next B12 due in 6 weeks.  Continue for Fulphila support. No nadir counts required as counts good with last cycle.  Continue Claritin to prevent growth factor induced bone pain.  Discuss plans for restaging studies after next cycle.  Discuss symptom management.  He has antiemetics and pain medications at home to use on a prn bases.  Interventions  are adequate.           3. Bone metastasis Symptomatically, he continues to do well.  Bone scan on 01/16/2020 revealed no evidence of metastatic disease.  He last received Xgeva on 11/01/2019.  Anticipate reinstitution of Xgeva with next cycle. 4.Anxiety and depression Clinically, he is doing better.   He is on low-dose Xanax as well as Prozac.  He is scheduled to see psychiatry in 02/21/2020. 5.Pulmonary embolism Patient mains on Eliquis. He denies any bleeding.  Maintain platelets > 50,000. 6.Chemotherapy induced anemia Hematocrit31.4. Hemoglobin9.6. MCV 96.0today.  [phone] an iron saturation of 18% and a TIBC of312on 01/11/2020. Patient last received Retacrit on 01/18/2020.  Continue Retacrit per protocol every 2 weeks. 7.   Cycle #15 Alimta and pembrolizumab today. 8.   RTC tomorrow for Fulphila. 9.   RTC in 3 weeks for MD assessment, labs (CBC with diff, CMP, Mg, TSH), and cycle #16 Alimta and pembrolizumab.  I discussed the assessment and treatment plan with the patient.  The patient was provided an opportunity to ask questions and all were answered.  The patient agreed with the plan and demonstrated an understanding of the instructions.  The patient was advised to call back if the symptoms worsen or if the condition fails to improve as anticipated.   Lequita Asal, MD, PhD 02/08/2020, 9:43 AM  I, Jacqualyn Posey, am acting as a Education administrator for [employer]. Mike Gip, MD.   I, Melissa C. Mike Gip, MD, have reviewed the above documentation for accuracy and completeness, and I agree with the above.

## 2020-02-08 ENCOUNTER — Other Ambulatory Visit: Payer: Self-pay

## 2020-02-08 ENCOUNTER — Encounter: Payer: Self-pay | Admitting: Hematology and Oncology

## 2020-02-08 ENCOUNTER — Inpatient Hospital Stay (HOSPITAL_BASED_OUTPATIENT_CLINIC_OR_DEPARTMENT_OTHER): Payer: Medicare Other | Admitting: Hematology and Oncology

## 2020-02-08 ENCOUNTER — Inpatient Hospital Stay: Payer: Medicare Other

## 2020-02-08 VITALS — Resp 20

## 2020-02-08 VITALS — BP 110/72 | HR 70 | Temp 97.0°F | Wt 129.3 lb

## 2020-02-08 DIAGNOSIS — F329 Major depressive disorder, single episode, unspecified: Secondary | ICD-10-CM

## 2020-02-08 DIAGNOSIS — Z5112 Encounter for antineoplastic immunotherapy: Secondary | ICD-10-CM | POA: Diagnosis not present

## 2020-02-08 DIAGNOSIS — Z5111 Encounter for antineoplastic chemotherapy: Secondary | ICD-10-CM

## 2020-02-08 DIAGNOSIS — D6481 Anemia due to antineoplastic chemotherapy: Secondary | ICD-10-CM

## 2020-02-08 DIAGNOSIS — C3431 Malignant neoplasm of lower lobe, right bronchus or lung: Secondary | ICD-10-CM | POA: Diagnosis not present

## 2020-02-08 DIAGNOSIS — C7951 Secondary malignant neoplasm of bone: Secondary | ICD-10-CM | POA: Diagnosis not present

## 2020-02-08 DIAGNOSIS — G893 Neoplasm related pain (acute) (chronic): Secondary | ICD-10-CM | POA: Diagnosis not present

## 2020-02-08 DIAGNOSIS — F419 Anxiety disorder, unspecified: Secondary | ICD-10-CM | POA: Diagnosis not present

## 2020-02-08 DIAGNOSIS — I2782 Chronic pulmonary embolism: Secondary | ICD-10-CM

## 2020-02-08 DIAGNOSIS — T451X5A Adverse effect of antineoplastic and immunosuppressive drugs, initial encounter: Secondary | ICD-10-CM

## 2020-02-08 DIAGNOSIS — D72819 Decreased white blood cell count, unspecified: Secondary | ICD-10-CM | POA: Diagnosis not present

## 2020-02-08 LAB — CBC WITH DIFFERENTIAL/PLATELET
Abs Immature Granulocytes: 0.07 10*3/uL (ref 0.00–0.07)
Basophils Absolute: 0.1 10*3/uL (ref 0.0–0.1)
Basophils Relative: 1 %
Eosinophils Absolute: 0.2 10*3/uL (ref 0.0–0.5)
Eosinophils Relative: 2 %
HCT: 31.4 % — ABNORMAL LOW (ref 39.0–52.0)
Hemoglobin: 9.6 g/dL — ABNORMAL LOW (ref 13.0–17.0)
Immature Granulocytes: 1 %
Lymphocytes Relative: 12 %
Lymphs Abs: 1.2 10*3/uL (ref 0.7–4.0)
MCH: 29.4 pg (ref 26.0–34.0)
MCHC: 30.6 g/dL (ref 30.0–36.0)
MCV: 96 fL (ref 80.0–100.0)
Monocytes Absolute: 1.1 10*3/uL — ABNORMAL HIGH (ref 0.1–1.0)
Monocytes Relative: 11 %
Neutro Abs: 7.3 10*3/uL (ref 1.7–7.7)
Neutrophils Relative %: 73 %
Platelets: 355 10*3/uL (ref 150–400)
RBC: 3.27 MIL/uL — ABNORMAL LOW (ref 4.22–5.81)
RDW: 16 % — ABNORMAL HIGH (ref 11.5–15.5)
WBC: 9.9 10*3/uL (ref 4.0–10.5)
nRBC: 0 % (ref 0.0–0.2)

## 2020-02-08 LAB — COMPREHENSIVE METABOLIC PANEL
ALT: 16 U/L (ref 0–44)
AST: 26 U/L (ref 15–41)
Albumin: 3.4 g/dL — ABNORMAL LOW (ref 3.5–5.0)
Alkaline Phosphatase: 84 U/L (ref 38–126)
Anion gap: 8 (ref 5–15)
BUN: 13 mg/dL (ref 8–23)
CO2: 28 mmol/L (ref 22–32)
Calcium: 9.3 mg/dL (ref 8.9–10.3)
Chloride: 102 mmol/L (ref 98–111)
Creatinine, Ser: 1.18 mg/dL (ref 0.61–1.24)
GFR calc Af Amer: >60 mL/min (ref >60)
GFR calc non Af Amer: >60 mL/min (ref >60)
Glucose, Bld: 113 mg/dL — ABNORMAL HIGH (ref 70–99)
Potassium: 4 mmol/L (ref 3.5–5.1)
Sodium: 138 mmol/L (ref 135–145)
Total Bilirubin: 0.6 mg/dL (ref 0.3–1.2)
Total Protein: 6.9 g/dL (ref 6.5–8.1)

## 2020-02-08 LAB — MAGNESIUM: Magnesium: 1.9 mg/dL (ref 1.7–2.4)

## 2020-02-08 MED ORDER — SODIUM CHLORIDE 0.9 % IV SOLN
Freq: Once | INTRAVENOUS | Status: AC
  Filled 2020-02-08: qty 250

## 2020-02-08 MED ORDER — HEPARIN SOD (PORK) LOCK FLUSH 100 UNIT/ML IV SOLN
500.0000 [IU] | Freq: Once | INTRAVENOUS | Status: AC
  Administered 2020-02-08: 500 [IU] via INTRAVENOUS
  Filled 2020-02-08: qty 5

## 2020-02-08 MED ORDER — SODIUM CHLORIDE 0.9 % IV SOLN
200.0000 mg | Freq: Once | INTRAVENOUS | Status: AC
  Administered 2020-02-08: 200 mg via INTRAVENOUS
  Filled 2020-02-08: qty 8

## 2020-02-08 MED ORDER — SODIUM CHLORIDE 0.9 % IV SOLN
400.0000 mg/m2 | Freq: Once | INTRAVENOUS | Status: AC
  Administered 2020-02-08: 700 mg via INTRAVENOUS
  Filled 2020-02-08: qty 8

## 2020-02-08 MED ORDER — SODIUM CHLORIDE 0.9% FLUSH
10.0000 mL | INTRAVENOUS | Status: DC | PRN
  Administered 2020-02-08: 10 mL via INTRAVENOUS
  Filled 2020-02-08: qty 10

## 2020-02-08 MED ORDER — HEPARIN SOD (PORK) LOCK FLUSH 100 UNIT/ML IV SOLN
INTRAVENOUS | Status: AC
  Filled 2020-02-08: qty 5

## 2020-02-08 MED ORDER — PALONOSETRON HCL INJECTION 0.25 MG/5ML
0.2500 mg | Freq: Once | INTRAVENOUS | Status: AC
  Administered 2020-02-08: 0.25 mg via INTRAVENOUS
  Filled 2020-02-08: qty 5

## 2020-02-08 NOTE — Progress Notes (Signed)
Patient denies any concerns today.  

## 2020-02-09 ENCOUNTER — Inpatient Hospital Stay: Payer: Medicare Other

## 2020-02-09 VITALS — BP 101/70 | HR 85 | Temp 98.0°F | Resp 20

## 2020-02-09 DIAGNOSIS — C3431 Malignant neoplasm of lower lobe, right bronchus or lung: Secondary | ICD-10-CM

## 2020-02-09 MED ORDER — PEGFILGRASTIM-JMDB 6 MG/0.6ML ~~LOC~~ SOSY
6.0000 mg | PREFILLED_SYRINGE | Freq: Once | SUBCUTANEOUS | Status: DC
  Filled 2020-02-09: qty 0.6

## 2020-02-13 ENCOUNTER — Other Ambulatory Visit: Payer: Self-pay | Admitting: *Deleted

## 2020-02-13 DIAGNOSIS — F419 Anxiety disorder, unspecified: Secondary | ICD-10-CM

## 2020-02-13 DIAGNOSIS — G893 Neoplasm related pain (acute) (chronic): Secondary | ICD-10-CM

## 2020-02-13 MED ORDER — OXYCODONE HCL 20 MG PO TABS: ORAL_TABLET | ORAL | 0 refills | Status: DC

## 2020-02-13 MED ORDER — ALPRAZOLAM 0.5 MG PO TABS: 0.5000 mg | ORAL_TABLET | Freq: Four times a day (QID) | ORAL | 0 refills | Status: DC | PRN

## 2020-02-27 ENCOUNTER — Other Ambulatory Visit: Payer: Self-pay | Admitting: *Deleted

## 2020-02-27 DIAGNOSIS — F419 Anxiety disorder, unspecified: Secondary | ICD-10-CM

## 2020-02-27 DIAGNOSIS — G893 Neoplasm related pain (acute) (chronic): Secondary | ICD-10-CM

## 2020-02-27 MED ORDER — ALPRAZOLAM 0.5 MG PO TABS: 0.5000 mg | ORAL_TABLET | Freq: Four times a day (QID) | ORAL | 0 refills | Status: DC | PRN

## 2020-02-27 MED ORDER — OXYCODONE HCL 20 MG PO TABS: ORAL_TABLET | ORAL | 0 refills | Status: DC

## 2020-02-27 NOTE — Progress Notes (Signed)
[facility]  [address] [place], [place] [address] Phone: [phone]  Fax: [phone]   Clinic Day:  02/29/2020  Referring physician: [name], NP  Chief Complaint: Jimmy West is a 69 y.o. male with metastatic adenocarcinoma of the lung who is seen for assessment prior to cycle #16 Alimta and pembrolizumab.   HPI: The patient was last seen in the medical oncology clinic on 02/08/2020. At that time, he denies any respiratory symptoms. Mood has improved. Exam is stable. Hematocrit was 31.4, hemoglobin 9.6, MCV 96.0, platelets 355,000, WBC 9,900. Albumin was 3.4. Magnesium was 1.9. He received cycle #15 Alimta and pembrolizumab.  He saw [name], NP via telephone on 02/28/2020.  He was doing well.  Pain was stable on oxycodone prn.  Mood and depression had improved.  He has a psychiatry appointment on 03/16/2020.  During the interim, his mood has improved. He has allergies that come and go. His breathing is okay, though he does get short of breath on exertion. His back pain is a 3/10 and he usually takes oxycodone QID. He is no longer taking Fentanyl. Denies nausea, vomiting, and diarrhea. His appetite is down. The patient hasn't done anything fun lately because he doesn't have anyone to do things with.   He states that he is "pretty sure" he received Fulphila on 02/09/2020. He took his steroids yesterday and has been taking folic acid.   Past Medical History:  Diagnosis Date  . Bulging lumbar disc   . Cancer ([place])    stage 4 lung cancer  . Heart attack [facility])     Past Surgical History:  Procedure Laterality Date  . CARDIAC CATHETERIZATION     two stents  . KNEE SURGERY Left   . PORTA CATH INSERTION N/A 05/21/2018   Procedure: PORTA CATH INSERTION;  Surgeon: Algernon Huxley, MD;  Location: [place] [facility];  Service: Cardiovascular;  Laterality: N/A;    Family History  Problem Relation Age of Onset  . Heart failure  Father   . Cancer Maternal Aunt   . Heart failure Maternal Uncle   . Dementia Paternal Grandmother   . Cancer Maternal Aunt     Social History:  reports that he quit smoking about 16 years ago. His smoking use included cigarettes. He has a 22.50 pack-year smoking history. He has never used smokeless tobacco. He reports previous alcohol use. He reports that he does not use drugs. He started smoking at age 79. He is smoking 1 1/2 packs/day. Patient denies known exposures to radiation ortoxins. 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 He has not been working recently as the salon was closed. He plays golf occasionally (none recently).He has 8 cats. The patient is alone today.   Allergies: No Known Allergies  Current Medications: Current Outpatient Medications  Medication Sig Dispense Refill  . acetaminophen (TYLENOL) 500 MG tablet Take 500 mg by mouth 3 (three) times daily as needed.     Marland Kitchen albuterol (VENTOLIN HFA) 108 (90 Base) MCG/ACT inhaler Inhale 2 puffs into the lungs every 6 (six) hours as needed for wheezing or shortness of breath. 18 g 3  . ALPRAZolam (XANAX) 0.5 MG tablet Take 1 tablet (0.5 mg total) by mouth 4 (four) times daily as needed for anxiety. 60 tablet 0  . Calcium 600-400 MG-UNIT CHEW Chew 2 tablets by mouth daily.    Marland Kitchen dexamethasone (DECADRON) 4 MG tablet Take 1 tab two times a  day the day before Alimta chemo, then take 2 tabs once a day for 3 days starting the day after chemo. 30 tablet 1  . ELIQUIS 5 MG TABS tablet TAKE 1 TABLET BY MOUTH TWICE DAILY 60 tablet 5  . FLUoxetine (PROZAC) 40 MG capsule Take 1 capsule (40 mg total) by mouth daily. 30 capsule 3  . folic acid (FOLVITE) 1 MG tablet Take 1 tablet (1 mg total) by mouth daily. Continue until 21 days after Alimta completed. 100 tablet 3  . ondansetron (ZOFRAN ODT) 8 MG disintegrating tablet Take 1 tablet (8 mg total) by mouth every 8 (eight) hours as needed for nausea or vomiting. 20 tablet  1  . Oxycodone HCl 20 MG TABS TAKE 1/2 TO 1 TABLET BY MOUTH EVERY 4 HOURS AS NEEDED FOR SEVERE PAIN 60 tablet 0  . polyethylene glycol (MIRALAX / GLYCOLAX) packet Take 17 g by mouth daily.    Marland Kitchen senna (SENOKOT) 8.6 MG tablet Take 1 tablet by mouth as needed.      No current facility-administered medications for this visit.   Facility-Administered Medications Ordered in Other Visits  Medication Dose Route Frequency Provider Last Rate Last Admin  . 0.9 %  sodium chloride infusion   Intravenous Once ,  C, MD      . 0.9 %  sodium chloride infusion   Intravenous Once PRN ,  C, MD      . albuterol (PROVENTIL) (2.5 MG/3ML) 0.083% nebulizer solution 2.5 mg  2.5 mg Nebulization Once PRN ,  C, MD      . alteplase (CATHFLO ACTIVASE) injection 2 mg  2 mg Intracatheter Once PRN Lequita Asal, MD      . EPINEPHrine (ADRENALIN) 1 MG/10ML injection 0.25 mg  0.25 mg Intravenous Once PRN ,  C, MD      . EPINEPHrine (ADRENALIN) 1 MG/10ML injection 0.25 mg  0.25 mg Intravenous Once PRN ,  C, MD      . heparin lock flush 100 unit/mL  500 Units Intracatheter Once PRN Mike Gip,  C, MD      . heparin lock flush 100 unit/mL  250 Units Intracatheter Once PRN Nolon Stalls C, MD      . sodium chloride flush (NS) 0.9 % injection 10 mL  10 mL Intravenous PRN Nolon Stalls C, MD   10 mL at 03/04/19 0959  . sodium chloride flush (NS) 0.9 % injection 10 mL  10 mL Intracatheter Once PRN ,  C, MD      . sodium chloride flush (NS) 0.9 % injection 3 mL  3 mL Intracatheter Once PRN Lequita Asal, MD        Review of Systems  Constitutional: Positive for weight loss (4 lbs). Negative for chills, diaphoresis, fever and malaise/fatigue.  HENT: Negative for congestion, ear discharge, ear pain, hearing loss, nosebleeds, sinus pain, sore throat and tinnitus.   Eyes: Negative for blurred vision and double vision.    Respiratory: Positive for shortness of breath (on exertion). Negative for cough, hemoptysis and sputum production.   Cardiovascular: Negative for chest pain, palpitations and leg swelling.  Gastrointestinal: Negative for abdominal pain, blood in stool, constipation, diarrhea, heartburn, melena, nausea and vomiting.       Decreased appetite.  Genitourinary: Negative for dysuria, frequency and urgency.  Musculoskeletal: Positive for back pain (3/10). Negative for falls, joint pain, myalgias and neck pain.  Skin: Negative for itching and rash.  Neurological: Negative for dizziness, tingling, sensory change, focal weakness, weakness  and headaches.  Endo/Heme/Allergies: Positive for environmental allergies (sinus drip). Negative for polydipsia. Does not bruise/bleed easily.  Psychiatric/Behavioral: Positive for depression (improved). Negative for memory loss. The patient is nervous/anxious (improved with Xanax). The patient does not have insomnia (on OTC medication).   All other systems reviewed and are negative.  Performance status (ECOG): 1  Vitals Blood pressure 122/66, pulse 70, temperature (!) 96 F (35.6 C), temperature source Tympanic, resp. rate 18, weight 125 lb (56.7 kg), SpO2 99 %.   Physical Exam Vitals and nursing note reviewed.  Constitutional:      General: He is not in acute distress.    Appearance: He is well-developed. He is not diaphoretic.     Interventions: Face mask in place.  HENT:     Head: Normocephalic and atraumatic.     Comments: Thin brown graying hair. Mustache.    Right Ear: Hearing normal.     Mouth/Throat:     Mouth: Mucous membranes are moist. No oral lesions.     Pharynx: Oropharynx is clear.     Comments: Dentures. Eyes:     General: No scleral icterus.    Extraocular Movements: Extraocular movements intact.     Conjunctiva/sclera: Conjunctivae normal.     Pupils: Pupils are equal, round, and reactive to light.     Comments: Glasses.  Gray/blue  eyes.  Neck:     Vascular: No JVD.  Cardiovascular:     Rate and Rhythm: Normal rate and regular rhythm.     Pulses: Normal pulses.     Heart sounds: Normal heart sounds. No murmur heard.  No friction rub. No gallop.   Pulmonary:     Effort: Pulmonary effort is normal. No respiratory distress.     Breath sounds: Normal breath sounds. No wheezing or rales.  Chest:     Chest wall: No tenderness.  Abdominal:     General: Bowel sounds are normal. There is no distension.     Palpations: Abdomen is soft. There is no mass.     Tenderness: There is no abdominal tenderness. There is no guarding or rebound.  Musculoskeletal:        General: No swelling or tenderness. Normal range of motion.     Cervical back: Normal range of motion and neck supple.  Lymphadenopathy:     Head:     Right side of head: No preauricular, posterior auricular or occipital adenopathy.     Left side of head: No preauricular, posterior auricular or occipital adenopathy.     Cervical: No cervical adenopathy.     Upper Body:     Right upper body: No supraclavicular or axillary adenopathy.     Left upper body: No supraclavicular or axillary adenopathy.     Lower Body: No right inguinal adenopathy. No left inguinal adenopathy.  Skin:    General: Skin is warm and dry.     Coloration: Skin is not pale.     Findings: No bruising, erythema, lesion or rash.  Neurological:     Mental Status: He is alert and oriented to person, place, and time. Mental status is at baseline.  Psychiatric:        Behavior: Behavior normal.        Thought Content: Thought content normal.        Judgment: Judgment normal.    Imaging studies: 04/30/2018:  PET scanrevealed a 3.4 cm hypermetabolic RLL pulmonary mass (SUV 13.2), 11 mm pulmonary nodule in the LEFT lung (SUV 4.5), RIGHT paratracheal lymph  node (SUV 5.1), and hypermetabolic activity within the T4 vertebral body (SUV 10.2).  05/03/2018:  Thoracic spine MRIon 05/03/2018 revealed T4  metastasiswith a 40% pathologic compression deformity and 5 mm of retropulsion of the vertebral body. Retropulsion results in mild spinal canal stenosis and mild bilateral C4-5 foraminal stenosis. There was paravertebral soft tissue thickening from mid T3 to mid T5, likely representing edema related to the pathologic compression deformity vs. possible extraosseous extension of the neoplasm. There were no additional thoracic spinal metastases noted.  05/03/2018:  Head MRIrevealed no intracranial metastatic disease. 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 Incidental mention made of mild paranasal sinus disease. 01/10/2019:  Cervical and thoracic spine CT at [facility] unchanged severe compression deformity/vertebral plana of T4 with a stable degree of retropulsion and focal mild spinal canal stenosis. There was interval enlargement of a right lower lobe pulmonary mass(4.5 x 3.5 cm compared to 3.9 x 2.9 cm)with redemonstrated spiculated pulmonary nodules. 01/28/2019:  Chest, abdomen, pelvisCTrevealed slight interval enlargement of a right lower lobe mass with central necrosis measuring 4.6 x 3.4 cm, previously 4.0 x 3.0 cm. There was no change in right upper lobe nodules(1.4 and 0.8 cm).Therewas almost no residua of left lung nodules, with irregular opacities in the apical left upper lobeandsuperior segment left lower lobe. There was no change in right hilar soft tissue and lymph nodes.There wasvertebra plana deformity of T4andno evidence of new osseous metastatic disease.There was no evidence of distant metastatic disease in the abdomen or pelvis.The1.6 x 1.1 cmleft adrenal nodule(non-metabolic on prior PET scan),was unchanged. 03/24/2019:  Renal ultrasoundrevealed no acute abnormality identified.There was no  hydronephrosis or bladder distention. 04/23/2019:  Abdomen and pelvis CT revealed no acute abdominal or pelvic pathology. There was fluid in the colon which can be seen with diarrhea. There was diverticulosis without evidence of diverticulitis. There was a stable 1.5 cm left adrenal nodule. 05/06/2019:  Chest, abdomen, and pelvisCTrevealed a positive response to interval therapy for the known right lower lobe lung carcinoma(4.6 x 3.4 cm to3.1 x 2.8 cm transversely). There hadbeen a mild interval decrease contiguous soft tissue that extends along the right hilum. The two right upper lobe noduleswerestable from the most recent prior study (smaller than 04/27/2018).There are no new lung nodules.Therewasno evidence of new metastatic disease.There was stable severe compression deformity/vertebra plana of T4.Therewasno evidence of other osseous metastatic disease.There was a stable left adrenal nodule consistent with an adenoma. 05/30/2019:  ChestCT angiogramrevealed a tiny subsegmental pulmonary embolusin the right lower lobe. There areas of ground-glass at the periphery of the right chest hadbecome more conspicuous. This mayrelate to mild pneumonitis, perhaps due to radiation, attention on follow-up.The dominant nodule in the right lung basewasstable in size. Heis onEliquis. 08/14/2019:  Cervical spine MRIrevealed no evidence of metastatic disease within the cervical spine. 09/16/2019:  Chest, abdomen and pelvis CTrevealed asimilar-appearing spiculated right lower lobe nodule.There was no evidence for metastatic disease in the abdomen or pelvis.There was stable left adrenal adenoma. 12/15/2019:  Chest CT revealed an enlarging right lower lobe mass c/w malignancy (3.0 x 2.4 cm to 3.3 x 2.7 cm).  There was slight enlargement in a spiculated right upper lobe subpleural nodule (0.8 x 1.5 cm to 1.2 x 0.7 cm). 01/16/2020:  Bone scan revealed no scintigraphic evidence of osseous  metastatic disease.   Infusion on 02/29/2020  Component Date Value Ref  Range Status  . WBC 02/29/2020 15.2* 4.0 - 10.5 K/uL Final  . RBC 02/29/2020 3.09* 4.22 - 5.81 MIL/uL Final  . Hemoglobin 02/29/2020 9.3* 13.0 - 17.0 g/dL Final  . HCT 02/29/2020 29.1* 39 - 52 % Final  . MCV 02/29/2020 94.2  80.0 - 100.0 fL Final  . MCH 02/29/2020 30.1  26.0 - 34.0 pg Final  . MCHC 02/29/2020 32.0  30.0 - 36.0 g/dL Final  . RDW 02/29/2020 16.4* 11.5 - 15.5 % Final  . Platelets 02/29/2020 464* 150 - 400 K/uL Final  . nRBC 02/29/2020 0.0  0.0 - 0.2 % Final  . Neutrophils Relative % 02/29/2020 81  % Final  . Neutro Abs 02/29/2020 12.3* 1.7 - 7.7 K/uL Final  . Lymphocytes Relative 02/29/2020 9  % Final  . Lymphs Abs 02/29/2020 1.4  0.7 - 4.0 K/uL Final  . Monocytes Relative 02/29/2020 8  % Final  . Monocytes Absolute 02/29/2020 1.2* 0 - 1 K/uL Final  . Eosinophils Relative 02/29/2020 1  % Final  . Eosinophils Absolute 02/29/2020 0.2  0 - 0 K/uL Final  . Basophils Relative 02/29/2020 0  % Final  . Basophils Absolute 02/29/2020 0.1  0 - 0 K/uL Final  . Immature Granulocytes 02/29/2020 1  % Final  . Abs Immature Granulocytes 02/29/2020 0.13* 0.00 - 0.07 K/uL Final   Performed at [facility], [address] [place], [place] [address]    Assessment:  Jimmy West is a 69 y.o. male with metastatichigh-grade adenocarcinomaof the right lungs/p CT-guided biopsy of a RLL lung mass on 05/11/2018. Pathologyrevealed an invasive high-grade adenocarcinoma, with predominantly solid growth pattern. The neoplastic cells were TTF-1 (+), Napsin A (+), and P40 (+). He has a T4 vertebral metastasis. Clinical stage is T4N1M1.  There was not enough material for [facility] testing. PDL-1revealed TPS 90%.  PET scanon 04/30/2018 revealed a 3.4 cm hypermetabolic RLL pulmonary mass (SUV 13.2), 11 mm pulmonary nodule in the LEFT lung (SUV 4.5), RIGHT paratracheal lymph node (SUV 5.1), and  hypermetabolic activity within the T4 vertebral body (SUV 10.2).   Thoracic spine MRIon 05/03/2018 revealed T4 metastasiswith a 40% pathologic compression deformity and 5 mm of retropulsion of the vertebral body. Retropulsion results in mild spinal canal stenosis and mild bilateral C4-5 foraminal stenosis. There was paravertebral soft tissue thickening from mid T3 to mid T5, likely representing edema related to the pathologic compression deformity vs. possible extraosseous extension of the neoplasm. There were no additional thoracic spinal metastases noted.   Head MRIon 05/03/2018 revealed no intracranial metastatic disease. 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 Incidental mention made of mild paranasal sinus disease.  Hereceived 11cycles ofpembrolizumab(05/24/2018 - 02/07/2019). He toleratedtreatment well. CEAwas 1.3 on 08/30/2018.LDHwas 165 on 11/29/2018.  He completed T4 radiationon 07/07/2018. He receives Xgevamonthly (06/03/2018 -11/01/2019).  He received 1 cycle of carboplatin, Alimta, and pembrolizumabon 02/08/2019. Cycle #1 was complicated by nausea, vomiting, and diarrhea necessitating hospitalization.Decision made to hold carboplatin with his second dose.He iss/p2cycles ofAlimta and pembrolizumab (02/28/2019 - 03/29/2019).Cycle #2 was complicated by nausea, vomiting, and dehydration requring fluids in clinic and an overnight stay in the hospital. Hereceivedcarboplatin (AUC 2), Alimta, and pebrolizumab(04/18/2019).He tolerated it poorly.   Hehasreceived15 cycles ofAlimta and pembrolizumabon (05/09/2019 -02/08/2020). He began Fulphila with cycle #14 secondary to neutropenia.  He has tolerated chemotherapy well. He receives  B12every 9 weeks (last06/30/2021).  He  hascancer-related painin T4. He is off the [employer].  He is taking oxycodone10-63m every 4 hours prn.  Chest, abdomen and pelvis CTon 09/16/2019 revealed asimilar-appearing spiculated right lower lobe nodule.There was no evidence for metastatic disease in the abdomen or pelvis.There was stable left adrenal adenoma.  Chest CT on 12/15/2019 revealed an enlarging right lower lobe mass c/w malignancy (3.0 x 2.4 cm to 3.3 x 2.7 cm).  There was slight enlargement in a spiculated right upper lobe subpleural nodule (0.8 x 1.5 cm to 1.2 x 0.7 cm).  Bone scan on 01/16/2020 revealed no scintigraphic evidence of osseous metastatic disease.  He has cervicalgia.Cervical spine MRIon 08/14/2019 showed no evidence of metastatic disease within the cervical spine. Heis followedby Dr YIzora Ribas  He has chemotherapy induced anemia.  He received Retacrit on 11/01/2019 (last 01/18/2020).  Hehad transient renal insufficiencyon 03/23/2019. Creatinine was 1.32 (baseline 0.61-1.0). Renal ultrasoundon 03/24/2019 revealed no acute abnormality identified.There was no hydronephrosis or bladder distention.Creatinine is 0.82 today.  Stool waspositive for C difficile + diarrheaon 06/14/2019.Hewas treated with oral vancomycin.  He received the influenza vaccineon 05/16/2019.   Symptomatically, his mood has improved.  He has shortness of breath on exertion.  Back pain remains well controlled.  Exam is stable.  Plan: 1.   Labs:  CBC with diff, CMP, Mg, TSH 2. Metastatic high-grade adenocarcinoma the RIGHT lung He is s/p 11 cycles of pembrolizumab. He is s/p 1 cycle of carboplatin, Almita, and pembrolizumab (02/08/2019). He is s/p 1 cycle of carboplatin (AUC 2), Alimta, and pembrolizumab (04/18/2019). He is s/p15cycles of Alimta and pembrolizumab (02/28/2019 - 03/29/2019; 05/09/2019-02/08/2020). Restaging CT scans on 05/27/2021revealed slight growth (3-4 mm). Labs reviewed.  Cycle  #16 Alimta and pembrolizumab today.  Patient again confirms that he has been taking his folic acid daily and his Decadron premeds.  Continue B12 every 9 weeks.  Patient unsure if he received for Fulphilia after his last treatment.   Suspect he did secondary to WBC count today.   Will receive this cycle without for Fulphila and follow-up nadir CBC in 10 days.  Discuss plans for restaging prior to next cycle.   Discuss symptom management.  He has antiemetics and pain medications at home to use on a prn bases.  Interventions are adequate.          3. Bone metastasis Symptomatically, he is doing well.  He states that his pain is chronic and a 3 out of 10.  Bone scan on 01/16/2020 revealed no evidence of metastatic disease.  He last received Xgeva on 11/01/2019.  Discuss plans for Xgeva with nadir check. 4.Anxiety and depression Clinically, his mood has improved.   He is on low-dose Xanax as well as Prozac.  He is scheduled to see psychiatry on 03/16/2020. 5.Pulmonary embolism He continues Eliquis.  He denies any bleeding.  Maintain platelets > 50,000. 6.Chemotherapy induced anemia Hematocrit29.1. Hemoglobin1.3. MCV 94.2today.  [phone] iron saturation of 18% and a TIBC of312on 01/11/2020. Patient last received Retacrit on 01/18/2020.  Continue Retacrit per protocol 7.   Alimta and pembrolizumab today. 8.   RTC in 10 days for labs (CBC with diff, BMP), XDelton See and, +/-Janey Genta and +/- Retacrit. 9.   Chest CT on 03/19/2020. 10.   RTC in 3 weeks for MD assessment, labs (CBC with diff, CMP, Mg), review of imaging studies, B12, and cycle #17 Alimta and pembrolizumab.  I discussed the assessment and treatment plan with the patient.  The patient  was provided an opportunity to ask questions and all were answered.  The patient agreed with the plan and demonstrated an understanding of the  instructions.  The patient was advised to call back if the symptoms worsen or if the condition fails to improve as anticipated.   Lequita Asal, MD, PhD 02/29/2020, 9:36 AM  I, Mirian Mo Tufford, am acting as a Education administrator for [employer]. Mike Gip, MD.   I, Hazelee Harbold C. Mike Gip, MD, have reviewed the above documentation for accuracy and completeness, and I agree with the above.

## 2020-02-27 NOTE — Telephone Encounter (Signed)
Pt requesting refill of xanax and oxycodone. Last fill was on 02/13/20 for 10 day supply.

## 2020-02-28 ENCOUNTER — Inpatient Hospital Stay: Payer: Medicare Other | Attending: Hospice and Palliative Medicine | Admitting: Hospice and Palliative Medicine

## 2020-02-28 DIAGNOSIS — C3431 Malignant neoplasm of lower lobe, right bronchus or lung: Secondary | ICD-10-CM | POA: Insufficient documentation

## 2020-02-28 DIAGNOSIS — F329 Major depressive disorder, single episode, unspecified: Secondary | ICD-10-CM | POA: Diagnosis not present

## 2020-02-28 DIAGNOSIS — F419 Anxiety disorder, unspecified: Secondary | ICD-10-CM | POA: Insufficient documentation

## 2020-02-28 DIAGNOSIS — G893 Neoplasm related pain (acute) (chronic): Secondary | ICD-10-CM | POA: Diagnosis not present

## 2020-02-28 DIAGNOSIS — Z5111 Encounter for antineoplastic chemotherapy: Secondary | ICD-10-CM | POA: Insufficient documentation

## 2020-02-28 DIAGNOSIS — Z515 Encounter for palliative care: Secondary | ICD-10-CM | POA: Diagnosis not present

## 2020-02-28 DIAGNOSIS — Z79899 Other long term (current) drug therapy: Secondary | ICD-10-CM | POA: Insufficient documentation

## 2020-02-28 DIAGNOSIS — C7951 Secondary malignant neoplasm of bone: Secondary | ICD-10-CM | POA: Insufficient documentation

## 2020-02-28 DIAGNOSIS — D6481 Anemia due to antineoplastic chemotherapy: Secondary | ICD-10-CM | POA: Insufficient documentation

## 2020-02-28 NOTE — Progress Notes (Signed)
Virtual Visit via Telephone Note  I connected with Jimmy West on 02/28/20 at 10:45 AM EDT by telephone and verified that I am speaking with the correct person using two identifiers.   I discussed the limitations, risks, security and privacy concerns of performing an evaluation and management service by telephone and the availability of in person appointments. I also discussed with the patient that there may be a patient responsible charge related to this service. The patient expressed understanding and agreed to proceed.   History of Present Illness: Mr. 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 Patient has had chronic neoplasm related pain and severe anxiety/depression.  He was referred to palliative care clinic to assist with symptom management and to establish treatment goals.   Observations/Objective: Patient did not respond to request for text/video visit.  Spoke with him by phone.  Patient says that he is doing reasonably well.  He denies any significant changes or concerns today.  He reports stable pain with use of oxycodone as needed.  Patient says that his moods and depression are improved.  He continues to take Prozac daily.  He is pending psychiatry appointment next week.  PDMP reviewed  Assessment and Plan: Stage IV lung cancer -on treatment with immunotherapy.  Followed by Dr. Mike Gip  Neoplasm related pain -continue oxycodone as needed  Anxiety -continue alprazolam as needed  Depression -continue Prozac.  Pending psychiatry appointment   Follow Up Instructions: Follow-up MyChart visit about a month   I discussed the assessment and treatment plan with the patient. The patient was provided an opportunity to ask questions and all were answered. The patient agreed with the plan and demonstrated an understanding of the instructions.   The patient was advised to call back  or seek an in-person evaluation if the symptoms worsen or if the condition fails to improve as anticipated.  I provided 15 minutes of non-face-to-face time during this encounter.   Irean Hong, NP

## 2020-02-29 ENCOUNTER — Inpatient Hospital Stay: Payer: Medicare Other

## 2020-02-29 ENCOUNTER — Inpatient Hospital Stay (HOSPITAL_BASED_OUTPATIENT_CLINIC_OR_DEPARTMENT_OTHER): Payer: Medicare Other | Admitting: Hematology and Oncology

## 2020-02-29 ENCOUNTER — Other Ambulatory Visit: Payer: Self-pay

## 2020-02-29 VITALS — BP 122/66 | HR 70 | Temp 96.0°F | Resp 18 | Wt 125.0 lb

## 2020-02-29 DIAGNOSIS — C3431 Malignant neoplasm of lower lobe, right bronchus or lung: Secondary | ICD-10-CM

## 2020-02-29 DIAGNOSIS — Z5112 Encounter for antineoplastic immunotherapy: Secondary | ICD-10-CM | POA: Diagnosis not present

## 2020-02-29 DIAGNOSIS — G893 Neoplasm related pain (acute) (chronic): Secondary | ICD-10-CM | POA: Diagnosis not present

## 2020-02-29 DIAGNOSIS — Z5111 Encounter for antineoplastic chemotherapy: Secondary | ICD-10-CM

## 2020-02-29 DIAGNOSIS — F329 Major depressive disorder, single episode, unspecified: Secondary | ICD-10-CM | POA: Diagnosis not present

## 2020-02-29 DIAGNOSIS — Z79899 Other long term (current) drug therapy: Secondary | ICD-10-CM | POA: Diagnosis not present

## 2020-02-29 DIAGNOSIS — F419 Anxiety disorder, unspecified: Secondary | ICD-10-CM | POA: Diagnosis not present

## 2020-02-29 DIAGNOSIS — D6481 Anemia due to antineoplastic chemotherapy: Secondary | ICD-10-CM | POA: Diagnosis not present

## 2020-02-29 DIAGNOSIS — C7951 Secondary malignant neoplasm of bone: Secondary | ICD-10-CM | POA: Diagnosis not present

## 2020-02-29 LAB — CBC WITH DIFFERENTIAL/PLATELET
Abs Immature Granulocytes: 0.13 10*3/uL — ABNORMAL HIGH (ref 0.00–0.07)
Basophils Absolute: 0.1 10*3/uL (ref 0.0–0.1)
Basophils Relative: 0 %
Eosinophils Absolute: 0.2 10*3/uL (ref 0.0–0.5)
Eosinophils Relative: 1 %
HCT: 29.1 % — ABNORMAL LOW (ref 39.0–52.0)
Hemoglobin: 9.3 g/dL — ABNORMAL LOW (ref 13.0–17.0)
Immature Granulocytes: 1 %
Lymphocytes Relative: 9 %
Lymphs Abs: 1.4 10*3/uL (ref 0.7–4.0)
MCH: 30.1 pg (ref 26.0–34.0)
MCHC: 32 g/dL (ref 30.0–36.0)
MCV: 94.2 fL (ref 80.0–100.0)
Monocytes Absolute: 1.2 10*3/uL — ABNORMAL HIGH (ref 0.1–1.0)
Monocytes Relative: 8 %
Neutro Abs: 12.3 10*3/uL — ABNORMAL HIGH (ref 1.7–7.7)
Neutrophils Relative %: 81 %
Platelets: 464 10*3/uL — ABNORMAL HIGH (ref 150–400)
RBC: 3.09 MIL/uL — ABNORMAL LOW (ref 4.22–5.81)
RDW: 16.4 % — ABNORMAL HIGH (ref 11.5–15.5)
WBC: 15.2 10*3/uL — ABNORMAL HIGH (ref 4.0–10.5)
nRBC: 0 % (ref 0.0–0.2)

## 2020-02-29 LAB — TSH: TSH: 1.438 u[IU]/mL (ref 0.350–4.500)

## 2020-02-29 LAB — COMPREHENSIVE METABOLIC PANEL
ALT: 12 U/L (ref 0–44)
AST: 22 U/L (ref 15–41)
Albumin: 3.4 g/dL — ABNORMAL LOW (ref 3.5–5.0)
Alkaline Phosphatase: 77 U/L (ref 38–126)
Anion gap: 8 (ref 5–15)
BUN: 14 mg/dL (ref 8–23)
CO2: 24 mmol/L (ref 22–32)
Calcium: 9.3 mg/dL (ref 8.9–10.3)
Chloride: 104 mmol/L (ref 98–111)
Creatinine, Ser: 1.17 mg/dL (ref 0.61–1.24)
GFR calc Af Amer: >60 mL/min (ref >60)
GFR calc non Af Amer: >60 mL/min (ref >60)
Glucose, Bld: 121 mg/dL — ABNORMAL HIGH (ref 70–99)
Potassium: 3.5 mmol/L (ref 3.5–5.1)
Sodium: 136 mmol/L (ref 135–145)
Total Bilirubin: 0.7 mg/dL (ref 0.3–1.2)
Total Protein: 7.1 g/dL (ref 6.5–8.1)

## 2020-02-29 LAB — MAGNESIUM: Magnesium: 1.9 mg/dL (ref 1.7–2.4)

## 2020-02-29 MED ORDER — SODIUM CHLORIDE 0.9 % IV SOLN
200.0000 mg | Freq: Once | INTRAVENOUS | Status: AC
  Administered 2020-02-29: 200 mg via INTRAVENOUS
  Filled 2020-02-29: qty 8

## 2020-02-29 MED ORDER — HEPARIN SOD (PORK) LOCK FLUSH 100 UNIT/ML IV SOLN
500.0000 [IU] | Freq: Once | INTRAVENOUS | Status: AC | PRN
  Administered 2020-02-29: 500 [IU]
  Filled 2020-02-29: qty 5

## 2020-02-29 MED ORDER — SODIUM CHLORIDE 0.9 % IV SOLN
400.0000 mg/m2 | Freq: Once | INTRAVENOUS | Status: AC
  Administered 2020-02-29: 700 mg via INTRAVENOUS
  Filled 2020-02-29: qty 20

## 2020-02-29 MED ORDER — PALONOSETRON HCL INJECTION 0.25 MG/5ML
0.2500 mg | Freq: Once | INTRAVENOUS | Status: AC
  Administered 2020-02-29: 0.25 mg via INTRAVENOUS
  Filled 2020-02-29: qty 5

## 2020-02-29 MED ORDER — SODIUM CHLORIDE 0.9 % IV SOLN
Freq: Once | INTRAVENOUS | Status: AC
  Filled 2020-02-29: qty 250

## 2020-03-08 ENCOUNTER — Telehealth: Payer: Self-pay | Admitting: Psychiatry

## 2020-03-12 ENCOUNTER — Ambulatory Visit: Payer: Medicare Other

## 2020-03-12 ENCOUNTER — Other Ambulatory Visit: Payer: Self-pay | Admitting: *Deleted

## 2020-03-12 ENCOUNTER — Inpatient Hospital Stay: Payer: Medicare Other

## 2020-03-12 ENCOUNTER — Other Ambulatory Visit: Payer: Self-pay

## 2020-03-12 VITALS — BP 117/73 | HR 70 | Temp 96.7°F | Resp 18

## 2020-03-12 DIAGNOSIS — C3431 Malignant neoplasm of lower lobe, right bronchus or lung: Secondary | ICD-10-CM

## 2020-03-12 DIAGNOSIS — C7951 Secondary malignant neoplasm of bone: Secondary | ICD-10-CM | POA: Diagnosis not present

## 2020-03-12 DIAGNOSIS — F419 Anxiety disorder, unspecified: Secondary | ICD-10-CM | POA: Diagnosis not present

## 2020-03-12 DIAGNOSIS — G893 Neoplasm related pain (acute) (chronic): Secondary | ICD-10-CM

## 2020-03-12 DIAGNOSIS — D6481 Anemia due to antineoplastic chemotherapy: Secondary | ICD-10-CM | POA: Diagnosis not present

## 2020-03-12 DIAGNOSIS — F329 Major depressive disorder, single episode, unspecified: Secondary | ICD-10-CM | POA: Diagnosis not present

## 2020-03-12 DIAGNOSIS — Z5111 Encounter for antineoplastic chemotherapy: Secondary | ICD-10-CM | POA: Diagnosis not present

## 2020-03-12 LAB — CBC WITH DIFFERENTIAL/PLATELET
Abs Immature Granulocytes: 0.02 10*3/uL (ref 0.00–0.07)
Basophils Absolute: 0 10*3/uL (ref 0.0–0.1)
Basophils Relative: 0 %
Eosinophils Absolute: 0.2 10*3/uL (ref 0.0–0.5)
Eosinophils Relative: 5 %
HCT: 27.1 % — ABNORMAL LOW (ref 39.0–52.0)
Hemoglobin: 8.8 g/dL — ABNORMAL LOW (ref 13.0–17.0)
Immature Granulocytes: 1 %
Lymphocytes Relative: 18 %
Lymphs Abs: 0.7 10*3/uL (ref 0.7–4.0)
MCH: 30.4 pg (ref 26.0–34.0)
MCHC: 32.5 g/dL (ref 30.0–36.0)
MCV: 93.8 fL (ref 80.0–100.0)
Monocytes Absolute: 1 10*3/uL (ref 0.1–1.0)
Monocytes Relative: 26 %
Neutro Abs: 2 10*3/uL (ref 1.7–7.7)
Neutrophils Relative %: 50 %
Platelets: 202 10*3/uL (ref 150–400)
RBC: 2.89 MIL/uL — ABNORMAL LOW (ref 4.22–5.81)
RDW: 17.2 % — ABNORMAL HIGH (ref 11.5–15.5)
WBC: 3.9 10*3/uL — ABNORMAL LOW (ref 4.0–10.5)
nRBC: 0 % (ref 0.0–0.2)

## 2020-03-12 LAB — BASIC METABOLIC PANEL
Anion gap: 7 (ref 5–15)
BUN: 7 mg/dL — ABNORMAL LOW (ref 8–23)
CO2: 25 mmol/L (ref 22–32)
Calcium: 8.7 mg/dL — ABNORMAL LOW (ref 8.9–10.3)
Chloride: 105 mmol/L (ref 98–111)
Creatinine, Ser: 1.06 mg/dL (ref 0.61–1.24)
GFR calc Af Amer: >60 mL/min (ref >60)
GFR calc non Af Amer: >60 mL/min (ref >60)
Glucose, Bld: 110 mg/dL — ABNORMAL HIGH (ref 70–99)
Potassium: 3.5 mmol/L (ref 3.5–5.1)
Sodium: 137 mmol/L (ref 135–145)

## 2020-03-12 MED ORDER — EPOETIN ALFA-EPBX 10000 UNIT/ML IJ SOLN
10000.0000 [IU] | Freq: Once | INTRAMUSCULAR | Status: AC
  Administered 2020-03-12: 10000 [IU] via SUBCUTANEOUS

## 2020-03-12 MED ORDER — OXYCODONE HCL 20 MG PO TABS: ORAL_TABLET | ORAL | 0 refills | Status: DC

## 2020-03-12 MED ORDER — DENOSUMAB 120 MG/1.7ML ~~LOC~~ SOLN
120.0000 mg | Freq: Once | SUBCUTANEOUS | Status: AC
  Administered 2020-03-12: 120 mg via SUBCUTANEOUS

## 2020-03-12 MED ORDER — ALPRAZOLAM 0.5 MG PO TABS: 0.5000 mg | ORAL_TABLET | Freq: Four times a day (QID) | ORAL | 0 refills | Status: DC | PRN

## 2020-03-12 NOTE — Telephone Encounter (Signed)
Pt requesting refill of xanax and oxycodone.

## 2020-03-20 NOTE — Progress Notes (Signed)
[facility]  [address] [place], [place] [address] Phone: [phone]  Fax: [phone]   Clinic Day:  03/21/2020  Referring physician: [name], NP  Chief Complaint: Jimmy West is a 69 y.o. male with metastatic adenocarcinoma of the lung who is seen for assessment prior to cycle #17 Alimta and pembrolizumab.   HPI: The patient was last seen in the medical oncology clinic on 02/29/2020. At that time, his mood had improved.  He had shortness of breath on exertion.  Back pain remained well controlled.  Exam was stable. Hematocrit was 29.1, hemoglobin 9.3, MCV 94.2, platelets 464,000, WBC 15,200 (ANC 12,300). Albumin was 3.4. Magnesium was 1.9. TSH was 1.438. He received cycle #16 Alimta and pembrolizumab without Fulphila support. He continued Eliquis. We discussed restaging studies prior to his next cycle of chemotherapy.  Labs from 03/12/2020 revealed a hematocrit of 27.1, hemoglobin 8.8, MCV 93.8, platelets 202,000, WBC 3,900. Calcium was 8.7 (corrected 9.21). The patient received Xgeva and Retacrit 10,000 units.  During the interim, he has been getting more short of breath on exertion and has developed a slight cough. He denies leg swelling and orthopnea. He states that he has been snacking more which has caused him to gain weight. His back pain is stable and he takes oxycodone prn. He has some on and off chest pain when he takes deep breaths (left sided pleuritic pain).  The patient has not seen the psychiatrist recently. He has been feeling more depressed but is not sure that a psychiatrist would be able to help him.   Past Medical History:  Diagnosis Date  . Bulging lumbar disc   . Cancer ([place])    stage 4 lung cancer  . Heart attack [facility])     Past Surgical History:  Procedure Laterality Date  . CARDIAC CATHETERIZATION     two stents  . KNEE SURGERY Left   . PORTA CATH INSERTION N/A 05/21/2018   Procedure: PORTA CATH  INSERTION;  Surgeon: Algernon Huxley, MD;  Location: [facility];  Service: Cardiovascular;  Laterality: N/A;    Family History  Problem Relation Age of Onset  . Heart failure Father   . Cancer Maternal Aunt   . Heart failure Maternal Uncle   . Dementia Paternal Grandmother   . Cancer Maternal Aunt     Social History:  reports that he quit smoking about 16 years ago. His smoking use included cigarettes. He has a 22.50 pack-year smoking history. He has never used smokeless tobacco. He reports previous alcohol use. He reports that he does not use drugs. He started smoking at age 40. He is smoking 1 1/2 packs/day. Patient denies known exposures to radiation ortoxins. 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 He has not been working recently as the salon was closed. He plays golf occasionally (none recently).He has 8 cats. The patient is alone today.   Allergies: No Known Allergies  Current Medications: Current Outpatient Medications  Medication Sig Dispense Refill  . acetaminophen (TYLENOL) 500 MG tablet Take 500 mg by mouth 3 (three) times daily as needed.     Marland Kitchen albuterol (VENTOLIN HFA) 108 (90 Base) MCG/ACT inhaler Inhale 2 puffs into the lungs every 6 (six) hours as needed for wheezing or shortness of breath. 18 g 3  . ALPRAZolam (XANAX) 0.5 MG tablet Take 1 tablet (0.5 mg total) by mouth 4 (four) times daily as needed for anxiety. 60 tablet 0  .  Calcium 600-400 MG-UNIT CHEW Chew 2 tablets by mouth daily.    Marland Kitchen dexamethasone (DECADRON) 4 MG tablet Take 1 tab two times a day the day before Alimta chemo, then take 2 tabs once a day for 3 days starting the day after chemo. 30 tablet 1  . ELIQUIS 5 MG TABS tablet TAKE 1 TABLET BY MOUTH TWICE DAILY 60 tablet 5  . FLUoxetine (PROZAC) 40 MG capsule Take 1 capsule (40 mg total) by mouth daily. 30 capsule 3  . folic acid (FOLVITE) 1 MG tablet Take 1 tablet (1 mg total) by mouth daily. Continue until 21 days after  Alimta completed. 100 tablet 3  . ondansetron (ZOFRAN ODT) 8 MG disintegrating tablet Take 1 tablet (8 mg total) by mouth every 8 (eight) hours as needed for nausea or vomiting. 20 tablet 1  . Oxycodone HCl 20 MG TABS TAKE 1/2 TO 1 TABLET BY MOUTH EVERY 4 HOURS AS NEEDED FOR SEVERE PAIN 60 tablet 0  . polyethylene glycol (MIRALAX / GLYCOLAX) packet Take 17 g by mouth daily.    Marland Kitchen senna (SENOKOT) 8.6 MG tablet Take 1 tablet by mouth as needed.      No current facility-administered medications for this visit.   Facility-Administered Medications Ordered in Other Visits  Medication Dose Route Frequency Provider Last Rate Last Admin  . 0.9 %  sodium chloride infusion   Intravenous Once Chelbie Jarnagin C, MD      . 0.9 %  sodium chloride infusion   Intravenous Once PRN Candra Wegner C, MD      . albuterol (PROVENTIL) (2.5 MG/3ML) 0.083% nebulizer solution 2.5 mg  2.5 mg Nebulization Once PRN Terrel Nesheiwat C, MD      . alteplase (CATHFLO ACTIVASE) injection 2 mg  2 mg Intracatheter Once PRN Lequita Asal, MD      . EPINEPHrine (ADRENALIN) 1 MG/10ML injection 0.25 mg  0.25 mg Intravenous Once PRN Rylynn Schoneman C, MD      . EPINEPHrine (ADRENALIN) 1 MG/10ML injection 0.25 mg  0.25 mg Intravenous Once PRN Saina Waage C, MD      . heparin lock flush 100 unit/mL  500 Units Intracatheter Once PRN Mike Gip, Kairo Laubacher C, MD      . heparin lock flush 100 unit/mL  250 Units Intracatheter Once PRN Nolon Stalls C, MD      . sodium chloride flush (NS) 0.9 % injection 10 mL  10 mL Intravenous PRN Nolon Stalls C, MD   10 mL at 03/04/19 0959  . sodium chloride flush (NS) 0.9 % injection 10 mL  10 mL Intracatheter Once PRN Vernal Hritz C, MD      . sodium chloride flush (NS) 0.9 % injection 3 mL  3 mL Intracatheter Once PRN Lequita Asal, MD        Review of Systems  Constitutional: Negative for chills, diaphoresis, fever, malaise/fatigue and weight loss (up 5 lbs).    HENT: Negative for congestion, ear discharge, ear pain, hearing loss, nosebleeds, sinus pain, sore throat and tinnitus.   Eyes: Negative for blurred vision and double vision.  Respiratory: Positive for cough and shortness of breath (on exertion, worsening). Negative for hemoptysis and sputum production.   Cardiovascular: Positive for chest pain (when he takes deep breaths, occasional). Negative for palpitations, orthopnea and leg swelling.  Gastrointestinal: Negative for abdominal pain, blood in stool, constipation, diarrhea, heartburn, melena, nausea and vomiting.       Snacking more.  Genitourinary: Negative for dysuria, frequency and  urgency.  Musculoskeletal: Positive for back pain (3/10). Negative for falls, joint pain, myalgias and neck pain.  Skin: Negative for itching and rash.  Neurological: Negative for dizziness, tingling, sensory change, focal weakness, weakness and headaches.  Endo/Heme/Allergies: Positive for environmental allergies (sinus drip). Negative for polydipsia. Does not bruise/bleed easily.  Psychiatric/Behavioral: Positive for depression (worsening). Negative for memory loss. The patient is nervous/anxious (on Xanax). The patient does not have insomnia (on OTC medication).   All other systems reviewed and are negative.  Performance status (ECOG): 1  Vitals Blood pressure 114/68, pulse 79, temperature (!) 97 F (36.1 C), temperature source Tympanic, resp. rate 18, weight 130 lb 1.1 oz (59 kg), SpO2 100 %.   Physical Exam Vitals and nursing note reviewed.  Constitutional:      General: He is not in acute distress.    Appearance: He is well-developed. He is not diaphoretic.     Interventions: Face mask in place.  HENT:     Head: Normocephalic and atraumatic.     Comments: Thin brown graying hair. Mustache.    Right Ear: Hearing normal.     Mouth/Throat:     Mouth: Mucous membranes are moist. No oral lesions.     Pharynx: Oropharynx is clear.     Comments:  Dentures. Eyes:     General: No scleral icterus.    Extraocular Movements: Extraocular movements intact.     Conjunctiva/sclera: Conjunctivae normal.     Pupils: Pupils are equal, round, and reactive to light.     Comments: Glasses.  Gray/blue eyes.  Neck:     Vascular: No JVD.  Cardiovascular:     Rate and Rhythm: Normal rate and regular rhythm.     Heart sounds: Normal heart sounds. No murmur heard.  No friction rub. No gallop.   Pulmonary:     Effort: Pulmonary effort is normal. No respiratory distress.     Comments: Questionable pleural rub on the left. Chest:     Chest wall: No tenderness.  Abdominal:     General: Bowel sounds are normal. There is no distension.     Palpations: Abdomen is soft. There is no hepatomegaly, splenomegaly or mass.     Tenderness: There is no abdominal tenderness. There is no guarding or rebound.  Musculoskeletal:        General: No swelling or tenderness. Normal range of motion.     Cervical back: Normal range of motion and neck supple.  Lymphadenopathy:     Head:     Right side of head: No preauricular, posterior auricular or occipital adenopathy.     Left side of head: No preauricular, posterior auricular or occipital adenopathy.     Cervical: No cervical adenopathy.     Upper Body:     Right upper body: No supraclavicular or axillary adenopathy.     Left upper body: No supraclavicular or axillary adenopathy.     Lower Body: No right inguinal adenopathy. No left inguinal adenopathy.  Skin:    General: Skin is warm and dry.     Coloration: Skin is not pale.     Findings: No bruising, erythema, lesion or rash.  Neurological:     Mental Status: He is alert and oriented to person, place, and time. Mental status is at baseline.  Psychiatric:        Behavior: Behavior normal.        Thought Content: Thought content normal.        Judgment: Judgment normal.  Imaging studies: 04/30/2018:  PET scanrevealed a 3.4 cm hypermetabolic RLL  pulmonary mass (SUV 13.2), 11 mm pulmonary nodule in the LEFT lung (SUV 4.5), RIGHT paratracheal lymph node (SUV 5.1), and hypermetabolic activity within the T4 vertebral body (SUV 10.2).  05/03/2018:  Thoracic spine MRIon 05/03/2018 revealed T4 metastasiswith a 40% pathologic compression deformity and 5 mm of retropulsion of the vertebral body. Retropulsion results in mild spinal canal stenosis and mild bilateral C4-5 foraminal stenosis. There was paravertebral soft tissue thickening from mid T3 to mid T5, likely representing edema related to the pathologic compression deformity vs. possible extraosseous extension of the neoplasm. There were no additional thoracic spinal metastases noted.  05/03/2018:  Head MRIrevealed no intracranial metastatic disease. 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 Incidental mention made of mild paranasal sinus disease. 01/10/2019:  Cervical and thoracic spine CT [facility] unchanged severe compression deformity/vertebral plana of T4 with a stable degree of retropulsion and focal mild spinal canal stenosis. There was interval enlargement of a right lower lobe pulmonary mass(4.5 x 3.5 cm compared to 3.9 x 2.9 cm)with redemonstrated spiculated pulmonary nodules. 01/28/2019:  Chest, abdomen, pelvisCTrevealed slight interval enlargement of a right lower lobe mass with central necrosis measuring 4.6 x 3.4 cm, previously 4.0 x 3.0 cm. There was no change in right upper lobe nodules(1.4 and 0.8 cm).Therewas almost no residua of left lung nodules, with irregular opacities in the apical left upper lobeandsuperior segment left lower lobe. There was no change in right hilar soft tissue and lymph nodes.There wasvertebra plana deformity of T4andno evidence of new osseous metastatic  disease.There was no evidence of distant metastatic disease in the abdomen or pelvis.The1.6 x 1.1 cmleft adrenal nodule(non-metabolic on prior PET scan),was unchanged. 03/24/2019:  Renal ultrasoundrevealed no acute abnormality identified.There was no hydronephrosis or bladder distention. 04/23/2019:  Abdomen and pelvis CT revealed no acute abdominal or pelvic pathology. There was fluid in the colon which can be seen with diarrhea. There was diverticulosis without evidence of diverticulitis. There was a stable 1.5 cm left adrenal nodule. 05/06/2019:  Chest, abdomen, and pelvisCTrevealed a positive response to interval therapy for the known right lower lobe lung carcinoma(4.6 x 3.4 cm to3.1 x 2.8 cm transversely). There hadbeen a mild interval decrease contiguous soft tissue that extends along the right hilum. The two right upper lobe noduleswerestable from the most recent prior study (smaller than 04/27/2018).There are no new lung nodules.Therewasno evidence of new metastatic disease.There was stable severe compression deformity/vertebra plana of T4.Therewasno evidence of other osseous metastatic disease.There was a stable left adrenal nodule consistent with an adenoma. 05/30/2019:  ChestCT angiogramrevealed a tiny subsegmental pulmonary embolusin the right lower lobe. There areas of ground-glass at the periphery of the right chest hadbecome more conspicuous. This mayrelate to mild pneumonitis, perhaps due to radiation, attention on follow-up.The dominant nodule in the right lung basewasstable in size. Heis onEliquis. 08/14/2019:  Cervical spine MRIrevealed no evidence of metastatic disease within the cervical spine. 09/16/2019:  Chest, abdomen and pelvis CTrevealed asimilar-appearing spiculated right lower lobe nodule.There was no evidence for metastatic disease in the abdomen or pelvis.There was stable left adrenal adenoma. 12/15/2019:  Chest CT revealed an  enlarging right lower lobe mass c/w malignancy (3.0 x 2.4 cm to 3.3 x 2.7 cm).  There was slight enlargement in a spiculated right upper lobe subpleural nodule (0.8 x 1.5  cm to 1.2 x 0.7 cm). 01/16/2020:  Bone scan revealed no scintigraphic evidence of osseous metastatic disease. 03/21/2020:  Chest CT angiogram revealed no evidence of pulmonary embolism.  A rounded spiculated mass in the RUL measuring 3.4 x 3 cm which was slightly enlarged with adjacent pleural thickening.  There was a stable 1.4 x 0.6 cm irregular subpleural density in the RUL.  There was stable severe compression deformity in the upper thoracic vertebral body consistent with an old fracture.   Infusion on 03/21/2020  Component Date Value Ref Range Status  . Magnesium 03/21/2020 1.7  1.7 - 2.4 mg/dL Final   Performed at [facility], [address], [place], [place] [address]  . WBC 03/21/2020 7.7  4.0 - 10.5 K/uL Final  . RBC 03/21/2020 3.08* 4.22 - 5.81 MIL/uL Final  . Hemoglobin 03/21/2020 9.0* 13.0 - 17.0 g/dL Final  . HCT 03/21/2020 29.3* 39 - 52 % Final  . MCV 03/21/2020 95.1  80.0 - 100.0 fL Final  . MCH 03/21/2020 29.2  26.0 - 34.0 pg Final  . MCHC 03/21/2020 30.7  30.0 - 36.0 g/dL Final  . RDW 03/21/2020 17.1* 11.5 - 15.5 % Final  . Platelets 03/21/2020 345  150 - 400 K/uL Final  . nRBC 03/21/2020 0.0  0.0 - 0.2 % Final  . Neutrophils Relative % 03/21/2020 71  % Final  . Neutro Abs 03/21/2020 5.5  1.7 - 7.7 K/uL Final  . Lymphocytes Relative 03/21/2020 14  % Final  . Lymphs Abs 03/21/2020 1.1  0.7 - 4.0 K/uL Final  . Monocytes Relative 03/21/2020 11  % Final  . Monocytes Absolute 03/21/2020 0.9  0 - 1 K/uL Final  . Eosinophils Relative 03/21/2020 3  % Final  . Eosinophils Absolute 03/21/2020 0.2  0 - 0 K/uL Final  . Basophils Relative 03/21/2020 0  % Final  . Basophils Absolute 03/21/2020 0.0  0 - 0 K/uL Final  . Immature Granulocytes 03/21/2020 1  % Final  . Abs Immature Granulocytes 03/21/2020  0.05  0.00 - 0.07 K/uL Final   Performed at [facility], [address] [place], [place] [address]  . Sodium 03/21/2020 139  135 - 145 mmol/L Final  . Potassium 03/21/2020 3.6  3.5 - 5.1 mmol/L Final  . Chloride 03/21/2020 102  98 - 111 mmol/L Final  . CO2 03/21/2020 26  22 - 32 mmol/L Final  . Glucose, Bld 03/21/2020 134* 70 - 99 mg/dL Final   Glucose reference range applies only to samples taken after fasting for at least 8 hours.  . BUN 03/21/2020 17  8 - 23 mg/dL Final  . Creatinine, Ser 03/21/2020 1.19  0.61 - 1.24 mg/dL Final  . Calcium 03/21/2020 9.8  8.9 - 10.3 mg/dL Final  . Total Protein 03/21/2020 6.6  6.5 - 8.1 g/dL Final  . Albumin 03/21/2020 3.4* 3.5 - 5.0 g/dL Final  . AST 03/21/2020 24  15 - 41 U/L Final  . ALT 03/21/2020 13  0 - 44 U/L Final  . Alkaline Phosphatase 03/21/2020 68  38 - 126 U/L Final  . Total Bilirubin 03/21/2020 0.5  0.3 - 1.2 mg/dL Final  . GFR calc non Af Amer 03/21/2020 >60  >60 mL/min Final  . GFR calc Af Amer 03/21/2020 >60  >60 mL/min Final  . Anion gap 03/21/2020 11  5 - 15 Final   Performed at [facility], [address], [place], [place] [address]  . Fibrin derivatives  D-dimer ([facility]) 03/21/2020 1,268.20* 0.00 - 499.00 ng/mL (FEU) Final   Comment: (NOTE) <> Exclusion of Venous Thromboembolism (VTE) - OUTPATIENT ONLY   (Emergency Department or [place])    0-499 ng/ml (FEU): With a low to intermediate pretest probability                      for VTE this test result excludes the diagnosis                      of VTE.   >499 ng/ml (FEU) : VTE not excluded; additional work up for VTE is                      required.  <> Testing on Inpatients and Evaluation of Disseminated Intravascular   Coagulation (DIC) Reference Range:   0-499 ng/ml (FEU) Performed at [facility], [address], [place], [place] [address]     Assessment:  Jimmy West is a 69 y.o. male with metastatichigh-grade  adenocarcinomaof the right lungs/p CT-guided biopsy of a RLL lung mass on 05/11/2018. Pathologyrevealed an invasive high-grade adenocarcinoma, with predominantly solid growth pattern. The neoplastic cells were TTF-1 (+), Napsin A (+), and P40 (+). He has a T4 vertebral metastasis. Clinical stage is T4N1M1.  There was not enough material for Foundation One testing. PDL-1revealed TPS 90%.  PET scanon 04/30/2018 revealed a 3.4 cm hypermetabolic RLL pulmonary mass (SUV 13.2), 11 mm pulmonary nodule in the LEFT lung (SUV 4.5), RIGHT paratracheal lymph node (SUV 5.1), and hypermetabolic activity within the T4 vertebral body (SUV 10.2).   Thoracic spine MRIon 05/03/2018 revealed T4 metastasiswith a 40% pathologic compression deformity and 5 mm of retropulsion of the vertebral body. Retropulsion results in mild spinal canal stenosis and mild bilateral C4-5 foraminal stenosis. There was paravertebral soft tissue thickening from mid T3 to mid T5, likely representing edema related to the pathologic compression deformity vs. possible extraosseous extension of the neoplasm. There were no additional thoracic spinal metastases noted.   Head MRIon 05/03/2018 revealed no intracranial metastatic disease. 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 Incidental mention made of mild paranasal sinus disease.  Hereceived 11cycles ofpembrolizumab(05/24/2018 - 02/07/2019). He toleratedtreatment well. CEAwas 1.3 on 08/30/2018.LDHwas 165 on 11/29/2018.  He completed T4 radiationon 07/07/2018. He receives Xgevamonthly (06/03/2018 -03/12/2020).  He received 1 cycle of carboplatin, Alimta, and pembrolizumabon 02/08/2019. Cycle #1 was complicated by nausea, vomiting, and diarrhea necessitating hospitalization.Decision made  to hold carboplatin with his second dose.He iss/p2cycles ofAlimta and pembrolizumab (02/28/2019 - 03/29/2019).Cycle #2 was complicated by nausea, vomiting, and dehydration requring fluids in clinic and an overnight stay in the hospital. Hereceivedcarboplatin (AUC 2), Alimta, and pebrolizumab(04/18/2019).He tolerated it poorly.   Hehasreceived16 cycles ofAlimta and pembrolizumabon (05/09/2019 -02/29/2020). He began Fulphila with cycle #14 secondary to neutropenia.  He has tolerated chemotherapy well. He receives B12every 9 weeks (last06/30/2021).  He hascancer-related painin T4. He is off the [employer].  He is taking oxycodone10-73m every 4 hours prn.  Chest, abdomen and pelvis CTon 09/16/2019 revealed asimilar-appearing spiculated right lower lobe nodule.There was no evidence for metastatic disease in the abdomen or pelvis.There was stable left adrenal adenoma.  Chest CT on 12/15/2019 revealed an enlarging right lower lobe mass c/w malignancy (3.0 x 2.4 cm to 3.3 x 2.7 cm).  There was  slight enlargement in a spiculated right upper lobe subpleural nodule (0.8 x 1.5 cm to 1.2 x 0.7 cm).  Bone scan on 01/16/2020 revealed no scintigraphic evidence of osseous metastatic disease.  He has cervicalgia.Cervical spine MRIon 08/14/2019 showed no evidence of metastatic disease within the cervical spine. Heis followedby Dr Izora Ribas.  He has chemotherapy induced anemia.  He received Retacrit on 11/01/2019 (last 03/12/2020).  Hehad transient renal insufficiencyon 03/23/2019. Creatinine was 1.32 (baseline 0.61-1.0). Renal ultrasoundon 03/24/2019 revealed no acute abnormality identified.There was no hydronephrosis or bladder distention.Creatinine is 0.82 today.  Stool waspositive for C difficile + diarrheaon 06/14/2019.Hewas treated with oral vancomycin.  He received the influenza vaccineon 05/16/2019.   Symptomatically, he notes left sided pleuritic  pain.  Exam reveals questionable rub.  Plan: 1.   Labs:  CBC with diff, CMP, Mg. 2. Metastatic high-grade adenocarcinoma the RIGHT lung He is s/p 11 cycles of pembrolizumab. He is s/p 1 cycle of carboplatin, Almita, and pembrolizumab (02/08/2019). He is s/p 1 cycle of carboplatin (AUC 2), Alimta, and pembrolizumab (04/18/2019). He is s/p16cycles of Alimta and pembrolizumab (02/28/2019 - 03/29/2019; 05/09/2019-02/29/2020). Restaging CT scans on 05/27/2021revealed slight growth (3-4 mm). Patient did not undergo interval chest CT for restaging. Discuss pause in therapy for chest CT angiogram today secondary to pleuritic chest pain.  Discuss symptom management.  He has antiemetics and pain medications at home to use on a prn bases.  Interventions are adequate.        3. Bone metastasis Symptomatically, he is doing well.  Bone scan on 01/16/2020 revealed no evidence of metastatic disease.  Pain is well controlled.    He last received Xgeva on 03/12/2020.  Continue monthly Xgeva. 4.Anxiety and depression Clinically, he states that his mood is slightly more depressed.   He is on low-dose Xanax as well as Prozac.  He canceled his appointment with psychiatry.  Psychiatrist: Dr. Uvaldo Rising [phone]).  Encourage patient to recontact psychiatry for scheduling.  Contact [name], NP for assistance 5.Pulmonary embolism He remains on Eliquis.  He denies any bleeding.  Maintain platelets > 50,000. 6.Chemotherapy induced anemia Hematocrit29.3. Hemoglobin9.0. MCV 95.1today.  [phone] an iron saturation of 18% and a TIBC of312on 01/11/2020. Patient last received Retacrit on 03/12/2020.  Continue Retacrit per protocol. 7.   B12 today. 8.   STAT D-dimer. 9.   STAT chest CT angiogram- r/o PE. 10.   Foundation One Liquid biopsy today. 11.   RN or MD to call patient with Foundation One  liquid biopsy results. 12.   RTC tomorrow in [place] for cycle #17 Alimta and pembrolizumab. 13.   RTC on 09/03 in [place] for [place] 14.   RTC in 10 days for labs (CBC with diff) and +/- Retacrit. 15.   RTC in 3 weeks for MD assessment, labs (CBC with diff, CMP, Mg, TSH), and +/- Alimta and pembrolizumab.  Addendum: D-dimers were 1268.2 (high).  Chest CT angiogram on 03/21/2020 revealed no evidence of pulmonary embolism.  A rounded spiculated mass in the RUL measuring 3.4 x 3 cm which was slightly enlarged with adjacent pleural thickening.  There was a stable 1.4 x 0.6 cm irregular subpleural density in the RUL.  There was stable severe compression deformity in the upper thoracic vertebral body consistent with an old fracture.  I discussed with the patient findings from his chest CT angiogram.  There is no thrombosis.  There is slight increase in the right upper lobe mass.  We discussed his thoughts about therapy.  We discussed  second line Taxotere.  The patient was hesitant.  He requested continuation of Alimta and pembrolizumab.  We discussed Foundation One liquid biopsy.  Several questions were asked and answered.  I discussed the assessment and treatment plan with the patient.  The patient was provided an opportunity to ask questions and all were answered.  The patient agreed with the plan and demonstrated an understanding of the instructions.  The patient was advised to call back if the symptoms worsen or if the condition fails to improve as anticipated.   Lequita Asal, MD, PhD 03/21/2020, 2:39 PM  I, De Burrs, am acting as a Education administrator for [employer]. Mike Gip, MD.   I, Linzey Ramser C. Mike Gip, MD, have reviewed the above documentation for accuracy and completeness, and I agree with the above.

## 2020-03-21 ENCOUNTER — Ambulatory Visit
Admission: RE | Admit: 2020-03-21 | Discharge: 2020-03-21 | Disposition: A | Discharge status: Home / Self Care | Payer: Medicare Other | Source: Ambulatory Visit | Attending: Hematology and Oncology | Admitting: Hematology and Oncology

## 2020-03-21 ENCOUNTER — Inpatient Hospital Stay: Payer: Medicare Other

## 2020-03-21 ENCOUNTER — Inpatient Hospital Stay: Payer: Medicare Other | Attending: Hematology and Oncology | Admitting: Hematology and Oncology

## 2020-03-21 ENCOUNTER — Other Ambulatory Visit: Payer: Self-pay

## 2020-03-21 VITALS — BP 114/68 | HR 79 | Temp 97.0°F | Resp 18 | Wt 130.1 lb

## 2020-03-21 DIAGNOSIS — J439 Emphysema, unspecified: Secondary | ICD-10-CM | POA: Diagnosis not present

## 2020-03-21 DIAGNOSIS — C7951 Secondary malignant neoplasm of bone: Secondary | ICD-10-CM | POA: Diagnosis not present

## 2020-03-21 DIAGNOSIS — R0781 Pleurodynia: Secondary | ICD-10-CM

## 2020-03-21 DIAGNOSIS — F329 Major depressive disorder, single episode, unspecified: Secondary | ICD-10-CM | POA: Insufficient documentation

## 2020-03-21 DIAGNOSIS — J9811 Atelectasis: Secondary | ICD-10-CM | POA: Diagnosis not present

## 2020-03-21 DIAGNOSIS — Z923 Personal history of irradiation: Secondary | ICD-10-CM | POA: Diagnosis not present

## 2020-03-21 DIAGNOSIS — Z7189 Other specified counseling: Secondary | ICD-10-CM | POA: Diagnosis not present

## 2020-03-21 DIAGNOSIS — R071 Chest pain on breathing: Secondary | ICD-10-CM | POA: Insufficient documentation

## 2020-03-21 DIAGNOSIS — G893 Neoplasm related pain (acute) (chronic): Secondary | ICD-10-CM | POA: Diagnosis not present

## 2020-03-21 DIAGNOSIS — I700 Atherosclerosis of aorta: Secondary | ICD-10-CM | POA: Diagnosis not present

## 2020-03-21 DIAGNOSIS — Z79899 Other long term (current) drug therapy: Secondary | ICD-10-CM | POA: Insufficient documentation

## 2020-03-21 DIAGNOSIS — C3431 Malignant neoplasm of lower lobe, right bronchus or lung: Secondary | ICD-10-CM | POA: Diagnosis not present

## 2020-03-21 DIAGNOSIS — I2782 Chronic pulmonary embolism: Secondary | ICD-10-CM

## 2020-03-21 DIAGNOSIS — F419 Anxiety disorder, unspecified: Secondary | ICD-10-CM

## 2020-03-21 DIAGNOSIS — Z5112 Encounter for antineoplastic immunotherapy: Secondary | ICD-10-CM | POA: Diagnosis not present

## 2020-03-21 DIAGNOSIS — I2510 Atherosclerotic heart disease of native coronary artery without angina pectoris: Secondary | ICD-10-CM | POA: Diagnosis not present

## 2020-03-21 DIAGNOSIS — Z5111 Encounter for antineoplastic chemotherapy: Secondary | ICD-10-CM

## 2020-03-21 DIAGNOSIS — T451X5A Adverse effect of antineoplastic and immunosuppressive drugs, initial encounter: Secondary | ICD-10-CM

## 2020-03-21 DIAGNOSIS — D6481 Anemia due to antineoplastic chemotherapy: Secondary | ICD-10-CM | POA: Diagnosis not present

## 2020-03-21 LAB — MAGNESIUM: Magnesium: 1.7 mg/dL (ref 1.7–2.4)

## 2020-03-21 LAB — COMPREHENSIVE METABOLIC PANEL
ALT: 13 U/L (ref 0–44)
AST: 24 U/L (ref 15–41)
Albumin: 3.4 g/dL — ABNORMAL LOW (ref 3.5–5.0)
Alkaline Phosphatase: 68 U/L (ref 38–126)
Anion gap: 11 (ref 5–15)
BUN: 17 mg/dL (ref 8–23)
CO2: 26 mmol/L (ref 22–32)
Calcium: 9.8 mg/dL (ref 8.9–10.3)
Chloride: 102 mmol/L (ref 98–111)
Creatinine, Ser: 1.19 mg/dL (ref 0.61–1.24)
GFR calc Af Amer: >60 mL/min (ref >60)
GFR calc non Af Amer: >60 mL/min (ref >60)
Glucose, Bld: 134 mg/dL — ABNORMAL HIGH (ref 70–99)
Potassium: 3.6 mmol/L (ref 3.5–5.1)
Sodium: 139 mmol/L (ref 135–145)
Total Bilirubin: 0.5 mg/dL (ref 0.3–1.2)
Total Protein: 6.6 g/dL (ref 6.5–8.1)

## 2020-03-21 LAB — CBC WITH DIFFERENTIAL/PLATELET
Abs Immature Granulocytes: 0.05 10*3/uL (ref 0.00–0.07)
Basophils Absolute: 0 10*3/uL (ref 0.0–0.1)
Basophils Relative: 0 %
Eosinophils Absolute: 0.2 10*3/uL (ref 0.0–0.5)
Eosinophils Relative: 3 %
HCT: 29.3 % — ABNORMAL LOW (ref 39.0–52.0)
Hemoglobin: 9 g/dL — ABNORMAL LOW (ref 13.0–17.0)
Immature Granulocytes: 1 %
Lymphocytes Relative: 14 %
Lymphs Abs: 1.1 10*3/uL (ref 0.7–4.0)
MCH: 29.2 pg (ref 26.0–34.0)
MCHC: 30.7 g/dL (ref 30.0–36.0)
MCV: 95.1 fL (ref 80.0–100.0)
Monocytes Absolute: 0.9 10*3/uL (ref 0.1–1.0)
Monocytes Relative: 11 %
Neutro Abs: 5.5 10*3/uL (ref 1.7–7.7)
Neutrophils Relative %: 71 %
Platelets: 345 10*3/uL (ref 150–400)
RBC: 3.08 MIL/uL — ABNORMAL LOW (ref 4.22–5.81)
RDW: 17.1 % — ABNORMAL HIGH (ref 11.5–15.5)
WBC: 7.7 10*3/uL (ref 4.0–10.5)
nRBC: 0 % (ref 0.0–0.2)

## 2020-03-21 LAB — FIBRIN DERIVATIVES D-DIMER (ARMC ONLY): Fibrin derivatives D-dimer (ARMC): 1268.2 ng/mL (FEU) — ABNORMAL HIGH (ref 0.00–499.00)

## 2020-03-21 MED ORDER — CYANOCOBALAMIN 1000 MCG/ML IJ SOLN
1000.0000 ug | Freq: Once | INTRAMUSCULAR | Status: AC
  Administered 2020-03-21: 1000 ug via INTRAMUSCULAR
  Filled 2020-03-21: qty 1

## 2020-03-21 MED ORDER — HEPARIN SOD (PORK) LOCK FLUSH 100 UNIT/ML IV SOLN
500.0000 [IU] | Freq: Once | INTRAVENOUS | Status: AC
  Administered 2020-03-21: 500 [IU] via INTRAVENOUS
  Filled 2020-03-21: qty 5

## 2020-03-21 MED ORDER — IOHEXOL 350 MG/ML SOLN
75.0000 mL | Freq: Once | INTRAVENOUS | Status: AC | PRN
  Administered 2020-03-21: 60 mL via INTRAVENOUS

## 2020-03-21 NOTE — Progress Notes (Signed)
SOB seems more noticeable recently Back pain 4/10. Cough and sinus drainage

## 2020-03-22 ENCOUNTER — Inpatient Hospital Stay: Payer: Medicare Other

## 2020-03-22 VITALS — BP 104/72 | HR 78 | Temp 97.4°F | Resp 18

## 2020-03-22 DIAGNOSIS — D6481 Anemia due to antineoplastic chemotherapy: Secondary | ICD-10-CM | POA: Diagnosis not present

## 2020-03-22 DIAGNOSIS — Z923 Personal history of irradiation: Secondary | ICD-10-CM | POA: Diagnosis not present

## 2020-03-22 DIAGNOSIS — C7951 Secondary malignant neoplasm of bone: Secondary | ICD-10-CM | POA: Diagnosis not present

## 2020-03-22 DIAGNOSIS — C3431 Malignant neoplasm of lower lobe, right bronchus or lung: Secondary | ICD-10-CM

## 2020-03-22 DIAGNOSIS — Z5111 Encounter for antineoplastic chemotherapy: Secondary | ICD-10-CM | POA: Diagnosis not present

## 2020-03-22 DIAGNOSIS — F419 Anxiety disorder, unspecified: Secondary | ICD-10-CM | POA: Diagnosis not present

## 2020-03-22 MED ORDER — SODIUM CHLORIDE 0.9 % IV SOLN
200.0000 mg | Freq: Once | INTRAVENOUS | Status: AC
  Administered 2020-03-22: 200 mg via INTRAVENOUS
  Filled 2020-03-22: qty 8

## 2020-03-22 MED ORDER — HEPARIN SOD (PORK) LOCK FLUSH 100 UNIT/ML IV SOLN
INTRAVENOUS | Status: AC
  Filled 2020-03-22: qty 5

## 2020-03-22 MED ORDER — PALONOSETRON HCL INJECTION 0.25 MG/5ML
0.2500 mg | Freq: Once | INTRAVENOUS | Status: AC
  Administered 2020-03-22: 0.25 mg via INTRAVENOUS
  Filled 2020-03-22: qty 5

## 2020-03-22 MED ORDER — HEPARIN SOD (PORK) LOCK FLUSH 100 UNIT/ML IV SOLN
500.0000 [IU] | Freq: Once | INTRAVENOUS | Status: AC | PRN
  Administered 2020-03-22: 500 [IU]
  Filled 2020-03-22: qty 5

## 2020-03-22 MED ORDER — SODIUM CHLORIDE 0.9 % IV SOLN
Freq: Once | INTRAVENOUS | Status: AC
  Filled 2020-03-22: qty 250

## 2020-03-22 MED ORDER — SODIUM CHLORIDE 0.9 % IV SOLN
400.0000 mg/m2 | Freq: Once | INTRAVENOUS | Status: AC
  Administered 2020-03-22: 700 mg via INTRAVENOUS
  Filled 2020-03-22: qty 8

## 2020-03-23 ENCOUNTER — Other Ambulatory Visit: Payer: Self-pay

## 2020-03-23 ENCOUNTER — Inpatient Hospital Stay: Payer: Medicare Other | Attending: Hematology and Oncology

## 2020-03-23 DIAGNOSIS — C3431 Malignant neoplasm of lower lobe, right bronchus or lung: Secondary | ICD-10-CM | POA: Insufficient documentation

## 2020-03-23 DIAGNOSIS — F419 Anxiety disorder, unspecified: Secondary | ICD-10-CM | POA: Insufficient documentation

## 2020-03-23 DIAGNOSIS — C7951 Secondary malignant neoplasm of bone: Secondary | ICD-10-CM | POA: Diagnosis not present

## 2020-03-23 DIAGNOSIS — F329 Major depressive disorder, single episode, unspecified: Secondary | ICD-10-CM | POA: Diagnosis not present

## 2020-03-23 DIAGNOSIS — G893 Neoplasm related pain (acute) (chronic): Secondary | ICD-10-CM | POA: Insufficient documentation

## 2020-03-23 DIAGNOSIS — D6481 Anemia due to antineoplastic chemotherapy: Secondary | ICD-10-CM | POA: Insufficient documentation

## 2020-03-23 DIAGNOSIS — Z79899 Other long term (current) drug therapy: Secondary | ICD-10-CM | POA: Diagnosis not present

## 2020-03-23 MED ORDER — PEGFILGRASTIM-JMDB 6 MG/0.6ML ~~LOC~~ SOSY
6.0000 mg | PREFILLED_SYRINGE | Freq: Once | SUBCUTANEOUS | Status: AC
  Administered 2020-03-23: 6 mg via SUBCUTANEOUS
  Filled 2020-03-23: qty 0.6

## 2020-03-26 DIAGNOSIS — R0781 Pleurodynia: Secondary | ICD-10-CM | POA: Insufficient documentation

## 2020-03-27 ENCOUNTER — Ambulatory Visit: Payer: Medicare Other

## 2020-03-28 ENCOUNTER — Other Ambulatory Visit: Payer: Self-pay | Admitting: Hematology and Oncology

## 2020-03-28 ENCOUNTER — Other Ambulatory Visit: Payer: Self-pay | Admitting: *Deleted

## 2020-03-28 DIAGNOSIS — F419 Anxiety disorder, unspecified: Secondary | ICD-10-CM

## 2020-03-28 DIAGNOSIS — G893 Neoplasm related pain (acute) (chronic): Secondary | ICD-10-CM

## 2020-03-28 MED ORDER — ALPRAZOLAM 0.5 MG PO TABS: 0.5000 mg | ORAL_TABLET | Freq: Four times a day (QID) | ORAL | 0 refills | Status: DC | PRN

## 2020-03-28 MED ORDER — OXYCODONE HCL 20 MG PO TABS: ORAL_TABLET | ORAL | 0 refills | Status: DC

## 2020-03-30 ENCOUNTER — Telehealth: Payer: Self-pay | Admitting: *Deleted

## 2020-03-30 NOTE — Telephone Encounter (Signed)
Spoke with pt's friend, [name], who stated that patient has been sleeping more often and taking more naps during the day. Pt would like to know what may be contributing to increased sleep and if he needed to do anything about it.   Please advise.

## 2020-04-05 ENCOUNTER — Other Ambulatory Visit: Payer: Self-pay

## 2020-04-05 ENCOUNTER — Telehealth: Payer: Self-pay | Admitting: Hematology and Oncology

## 2020-04-05 DIAGNOSIS — C3431 Malignant neoplasm of lower lobe, right bronchus or lung: Secondary | ICD-10-CM

## 2020-04-05 NOTE — Telephone Encounter (Signed)
Re:  [facility] notification  [facility] liquid CDx unable to be performed on sample submitted.  New sample needed.   Lequita Asal, MD

## 2020-04-09 ENCOUNTER — Inpatient Hospital Stay: Payer: Medicare Other

## 2020-04-09 ENCOUNTER — Other Ambulatory Visit: Payer: Self-pay

## 2020-04-09 ENCOUNTER — Other Ambulatory Visit: Payer: Self-pay | Admitting: *Deleted

## 2020-04-09 VITALS — BP 128/79 | HR 80 | Resp 18

## 2020-04-09 DIAGNOSIS — D6481 Anemia due to antineoplastic chemotherapy: Secondary | ICD-10-CM | POA: Diagnosis not present

## 2020-04-09 DIAGNOSIS — C3431 Malignant neoplasm of lower lobe, right bronchus or lung: Secondary | ICD-10-CM

## 2020-04-09 DIAGNOSIS — C7951 Secondary malignant neoplasm of bone: Secondary | ICD-10-CM

## 2020-04-09 DIAGNOSIS — G893 Neoplasm related pain (acute) (chronic): Secondary | ICD-10-CM

## 2020-04-09 DIAGNOSIS — F419 Anxiety disorder, unspecified: Secondary | ICD-10-CM | POA: Diagnosis not present

## 2020-04-09 DIAGNOSIS — Z5111 Encounter for antineoplastic chemotherapy: Secondary | ICD-10-CM | POA: Diagnosis not present

## 2020-04-09 DIAGNOSIS — Z923 Personal history of irradiation: Secondary | ICD-10-CM | POA: Diagnosis not present

## 2020-04-09 LAB — CBC WITH DIFFERENTIAL/PLATELET
Abs Immature Granulocytes: 0.11 10*3/uL — ABNORMAL HIGH (ref 0.00–0.07)
Basophils Absolute: 0.1 10*3/uL (ref 0.0–0.1)
Basophils Relative: 1 %
Eosinophils Absolute: 0.4 10*3/uL (ref 0.0–0.5)
Eosinophils Relative: 3 %
HCT: 31.3 % — ABNORMAL LOW (ref 39.0–52.0)
Hemoglobin: 9.8 g/dL — ABNORMAL LOW (ref 13.0–17.0)
Immature Granulocytes: 1 %
Lymphocytes Relative: 10 %
Lymphs Abs: 1.2 10*3/uL (ref 0.7–4.0)
MCH: 29.3 pg (ref 26.0–34.0)
MCHC: 31.3 g/dL (ref 30.0–36.0)
MCV: 93.7 fL (ref 80.0–100.0)
Monocytes Absolute: 0.8 10*3/uL (ref 0.1–1.0)
Monocytes Relative: 7 %
Neutro Abs: 10 10*3/uL — ABNORMAL HIGH (ref 1.7–7.7)
Neutrophils Relative %: 78 %
Platelets: 343 10*3/uL (ref 150–400)
RBC: 3.34 MIL/uL — ABNORMAL LOW (ref 4.22–5.81)
RDW: 15.9 % — ABNORMAL HIGH (ref 11.5–15.5)
WBC: 12.6 10*3/uL — ABNORMAL HIGH (ref 4.0–10.5)
nRBC: 0 % (ref 0.0–0.2)

## 2020-04-09 MED ORDER — EPOETIN ALFA-EPBX 10000 UNIT/ML IJ SOLN
10000.0000 [IU] | Freq: Once | INTRAMUSCULAR | Status: AC
  Administered 2020-04-09: 10000 [IU] via SUBCUTANEOUS

## 2020-04-09 MED ORDER — ALPRAZOLAM 0.5 MG PO TABS: 0.5000 mg | ORAL_TABLET | Freq: Four times a day (QID) | ORAL | 0 refills | Status: DC | PRN

## 2020-04-09 MED ORDER — OXYCODONE HCL 20 MG PO TABS: ORAL_TABLET | ORAL | 0 refills | Status: DC

## 2020-04-09 NOTE — Telephone Encounter (Signed)
Pt requests refill of oxycodone and xanax. States is starting new treatment on Wed and would like refills before then if possible.

## 2020-04-10 ENCOUNTER — Inpatient Hospital Stay (HOSPITAL_BASED_OUTPATIENT_CLINIC_OR_DEPARTMENT_OTHER): Payer: Medicare Other | Admitting: Hospice and Palliative Medicine

## 2020-04-10 DIAGNOSIS — C7951 Secondary malignant neoplasm of bone: Secondary | ICD-10-CM

## 2020-04-10 DIAGNOSIS — F329 Major depressive disorder, single episode, unspecified: Secondary | ICD-10-CM | POA: Diagnosis not present

## 2020-04-10 DIAGNOSIS — Z515 Encounter for palliative care: Secondary | ICD-10-CM | POA: Diagnosis not present

## 2020-04-10 NOTE — Progress Notes (Signed)
Virtual Visit via Telephone Note  I connected with [name] on 04/10/20 at  1:00 PM EDT by telephone and verified that I am speaking with the correct person using two identifiers.   I discussed the limitations, risks, security and privacy concerns of performing an evaluation and management service by telephone and the availability of in person appointments. I also discussed with the patient that there may be a patient responsible charge related to this service. The patient expressed understanding and agreed to proceed.   History of Present Illness: Mr. 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 Patient has had chronic neoplasm related pain and severe anxiety/depression.  He was referred to palliative care clinic to assist with symptom management and to establish treatment goals.   Observations/Objective: Patient reports he is doing reasonably well.  He reports stable pain.  He continues to endorse depressive feelings but feels like symptoms are overall stable on Prozac.  I had previously referred him to psychiatry but he canceled the appointment.  We discussed about psychiatry referral today and patient is not sure that his benefit.  He says "what is the point when you just can to die anyway."  I explained the benefit and trying to maximize his quality of life for however long his life expectancy.  Patient states that he will think about it.  Assessment and Plan: Stage IV lung cancer -on treatment with immunotherapy.  Followed by Dr. Mike Gip  Neoplasm related pain -continue oxycodone as needed  Anxiety -continue alprazolam as needed  Depression -continue Prozac.     Follow Up Instructions: Follow-up MyChart visit about a month   I discussed the assessment and treatment plan with the patient. The patient was provided an opportunity to ask questions and all were answered. The patient  agreed with the plan and demonstrated an understanding of the instructions.   The patient was advised to call back or seek an in-person evaluation if the symptoms worsen or if the condition fails to improve as anticipated.  I provided 15 minutes of non-face-to-face time during this encounter.   Irean Hong, NP

## 2020-04-10 NOTE — Progress Notes (Signed)
[facility]  [address], [address] [place],  [address] Phone: [phone]  Fax: [phone]   Clinic Day:  04/11/2020  Referring physician: [name], NP  Chief Complaint: Jimmy West is a 69 y.o. male with metastatic adenocarcinoma of the lung who is seen for assessment prior to cycle #18 Alimta and pembrolizumab.   HPI: The patient was last seen in the medical oncology clinic on 03/21/2020. At that time, he noted left sided pleuritic pain.  Exam revealed questionable rub. D-dimers were 1268.2 (high).  Chest CT angiogram revealed no evidence of pulmonary embolism.  A rounded spiculated 3.4 x 3.3 cm  mass in the RUL was slightly enlarged with adjacent pleural thickening.  There was a stable 1.4 x 0.6 cm irregular subpleural density in the RUL.  Scans reviewed with the patient.  Decision was made to proceed with cycle #17 Alimta and pembrolizumab with Fulphila support.  We discussed future treatment options.  Foundation One liquid CDx was unable to be performed on the sample submitted; a new sample is needed.   He received Retacrit on 04/09/2020. Hemoglobin was 9.8.  He met with [name], NP on 04/10/2020 for palliative care. His pain levels are stable. He continues to have depressive episodes regarding his diagnosis. Psychiatry evaluation was recommended, but canceled by the patient.   He has a follow-up appointment in 1 month.  During the interim, he has been ok. He tolerated the most recent treatment well. He states that his mood has not been very good. He wants to do a liquid biopsy first, and then he is willing to get a regular biopsy of his lung if it does not yield results.    Past Medical History:  Diagnosis Date  . Bulging lumbar disc   . Cancer ([place])    stage 4 lung cancer  . Heart attack [facility])     Past Surgical History:  Procedure Laterality Date  . CARDIAC CATHETERIZATION     two stents  . KNEE SURGERY Left   .  PORTA CATH INSERTION N/A 05/21/2018   Procedure: PORTA CATH INSERTION;  Surgeon: Algernon Huxley, MD;  Location: [facility];  Service: Cardiovascular;  Laterality: N/A;    Family History  Problem Relation Age of Onset  . Heart failure Father   . Cancer Maternal Aunt   . Heart failure Maternal Uncle   . Dementia Paternal Grandmother   . Cancer Maternal Aunt     Social History:  reports that he quit smoking about 16 years ago. His smoking use included cigarettes. He has a 22.50 pack-year smoking history. He has never used smokeless tobacco. He reports previous alcohol use. He reports that he does not use drugs. He started smoking at age 55. He is smoking 1 1/2 packs/day. Patient denies known exposures to radiation ortoxins. 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 He has not been working recently as the salon was closed. He plays golf occasionally (none recently).He has 8 cats. The patient is alone today.   Allergies: No Known Allergies  Current Medications: Current Outpatient Medications  Medication Sig Dispense Refill  . ALPRAZolam (XANAX) 0.5 MG tablet Take 1 tablet (0.5 mg total) by mouth 4 (four) times daily as needed for anxiety. 60 tablet 0  . Calcium 600-400 MG-UNIT CHEW Chew 2 tablets by mouth daily.    Marland Kitchen dexamethasone (DECADRON) 4 MG tablet Take 1 tab two times a day the day before Alimta  chemo, then take 2 tabs once a day for 3 days starting the day after chemo. 30 tablet 1  . ELIQUIS 5 MG TABS tablet TAKE 1 TABLET BY MOUTH TWICE DAILY 60 tablet 5  . FLUoxetine (PROZAC) 40 MG capsule Take 1 capsule (40 mg total) by mouth daily. 30 capsule 3  . folic acid (FOLVITE) 1 MG tablet Take 1 tablet (1 mg total) by mouth daily. Continue until 21 days after Alimta completed. 100 tablet 3  . ondansetron (ZOFRAN-ODT) 8 MG disintegrating tablet DISSOLVE 1 TABLET ON TONGUE EVERY 8 HOURS AS NEEDED FOR NAUSEA OR VOMITING 20 tablet 1  . Oxycodone HCl 20 MG TABS  TAKE 1/2 TO 1 TABLET BY MOUTH EVERY 4 HOURS AS NEEDED FOR SEVERE PAIN 60 tablet 0  . polyethylene glycol (MIRALAX / GLYCOLAX) packet Take 17 g by mouth daily.    Marland Kitchen senna (SENOKOT) 8.6 MG tablet Take 1 tablet by mouth as needed.     Marland Kitchen acetaminophen (TYLENOL) 500 MG tablet Take 500 mg by mouth 3 (three) times daily as needed.  (Patient not taking: Reported on 04/11/2020)    . albuterol (VENTOLIN HFA) 108 (90 Base) MCG/ACT inhaler Inhale 2 puffs into the lungs every 6 (six) hours as needed for wheezing or shortness of breath. (Patient not taking: Reported on 04/11/2020) 18 g 3   No current facility-administered medications for this visit.   Facility-Administered Medications Ordered in Other Visits  Medication Dose Route Frequency Provider Last Rate Last Admin  . 0.9 %  sodium chloride infusion   Intravenous Once Kemoni Ortega C, MD      . 0.9 %  sodium chloride infusion   Intravenous Once PRN Odester Nilson C, MD      . albuterol (PROVENTIL) (2.5 MG/3ML) 0.083% nebulizer solution 2.5 mg  2.5 mg Nebulization Once PRN Braylin Formby C, MD      . alteplase (CATHFLO ACTIVASE) injection 2 mg  2 mg Intracatheter Once PRN Lequita Asal, MD      . EPINEPHrine (ADRENALIN) 1 MG/10ML injection 0.25 mg  0.25 mg Intravenous Once PRN Zarianna Dicarlo C, MD      . EPINEPHrine (ADRENALIN) 1 MG/10ML injection 0.25 mg  0.25 mg Intravenous Once PRN Curtistine Pettitt C, MD      . heparin lock flush 100 unit/mL  500 Units Intracatheter Once PRN Mike Gip, Ildefonso Keaney C, MD      . heparin lock flush 100 unit/mL  250 Units Intracatheter Once PRN Nolon Stalls C, MD      . sodium chloride flush (NS) 0.9 % injection 10 mL  10 mL Intravenous PRN Nolon Stalls C, MD   10 mL at 03/04/19 0959  . sodium chloride flush (NS) 0.9 % injection 10 mL  10 mL Intracatheter Once PRN Rondal Vandevelde C, MD      . sodium chloride flush (NS) 0.9 % injection 3 mL  3 mL Intracatheter Once PRN Lequita Asal, MD         Review of Systems  Constitutional: Positive for weight loss (4 lbs). Negative for chills, diaphoresis, fever and malaise/fatigue.       Feels "alright".  HENT: Negative.  Negative for congestion, ear discharge, ear pain, hearing loss, nosebleeds, sinus pain, sore throat and tinnitus.   Eyes: Negative.  Negative for blurred vision and double vision.  Respiratory: Positive for cough and shortness of breath (on exertion). Negative for hemoptysis and sputum production.   Cardiovascular: Positive for chest pain (when he takes deep  breaths, occasional). Negative for palpitations, orthopnea and leg swelling.  Gastrointestinal: Negative for abdominal pain, blood in stool, constipation, diarrhea, heartburn, melena, nausea and vomiting.       Snacking more.  Genitourinary: Negative for dysuria, frequency and urgency.  Musculoskeletal: Positive for back pain (mild). Negative for falls, joint pain, myalgias and neck pain.  Skin: Negative.  Negative for itching and rash.  Neurological: Negative for dizziness, tingling, sensory change, focal weakness, weakness and headaches.  Endo/Heme/Allergies: Positive for environmental allergies (sinus drainage). Negative for polydipsia. Does not bruise/bleed easily.  Psychiatric/Behavioral: Positive for depression. Negative for memory loss. The patient is nervous/anxious (on Xanax). The patient does not have insomnia (on OTC medication).   All other systems reviewed and are negative.  Performance status (ECOG): 1  Vitals Blood pressure 138/79, pulse 89, temperature (!) 96.5 F (35.8 C), temperature source Tympanic, resp. rate 18, weight 126 lb 12.2 oz (57.5 kg), SpO2 99 %.   Physical Exam Vitals and nursing note reviewed.  Constitutional:      General: He is not in acute distress.    Appearance: He is well-developed. He is not diaphoretic.     Interventions: Face mask in place.  HENT:     Head: Normocephalic and atraumatic.     Comments: Thin brown  graying hair. Mustache.    Right Ear: Hearing normal.     Mouth/Throat:     Mouth: Mucous membranes are moist. No oral lesions.     Pharynx: Oropharynx is clear.     Comments: Dentures. Eyes:     General: No scleral icterus.    Extraocular Movements: Extraocular movements intact.     Conjunctiva/sclera: Conjunctivae normal.     Pupils: Pupils are equal, round, and reactive to light.     Comments: Glasses.  Gray/blue eyes.  Neck:     Vascular: No JVD.  Cardiovascular:     Rate and Rhythm: Normal rate and regular rhythm.     Heart sounds: Normal heart sounds. No murmur heard.  No friction rub. No gallop.   Pulmonary:     Effort: Pulmonary effort is normal. No respiratory distress.     Breath sounds: No wheezing or rhonchi.  Chest:     Chest wall: No tenderness.  Abdominal:     General: Bowel sounds are normal. There is no distension.     Palpations: Abdomen is soft. There is no hepatomegaly, splenomegaly or mass.     Tenderness: There is no abdominal tenderness. There is no guarding or rebound.  Musculoskeletal:        General: No swelling or tenderness. Normal range of motion.     Cervical back: Normal range of motion and neck supple.  Lymphadenopathy:     Head:     Right side of head: No preauricular, posterior auricular or occipital adenopathy.     Left side of head: No preauricular, posterior auricular or occipital adenopathy.     Cervical: No cervical adenopathy.     Upper Body:     Right upper body: No supraclavicular or axillary adenopathy.     Left upper body: No supraclavicular or axillary adenopathy.     Lower Body: No right inguinal adenopathy. No left inguinal adenopathy.  Skin:    General: Skin is warm and dry.     Coloration: Skin is not pale.     Findings: No bruising, erythema, lesion or rash.  Neurological:     Mental Status: He is alert and oriented to person, place, and time.  Mental status is at baseline.  Psychiatric:        Behavior: Behavior normal.         Thought Content: Thought content normal.        Judgment: Judgment normal.    Imaging studies: 04/30/2018:  PET scanrevealed a 3.4 cm hypermetabolic RLL pulmonary mass (SUV 13.2), 11 mm pulmonary nodule in the LEFT lung (SUV 4.5), RIGHT paratracheal lymph node (SUV 5.1), and hypermetabolic activity within the T4 vertebral body (SUV 10.2).  05/03/2018:  Thoracic spine MRIon 05/03/2018 revealed T4 metastasiswith a 40% pathologic compression deformity and 5 mm of retropulsion of the vertebral body. Retropulsion results in mild spinal canal stenosis and mild bilateral C4-5 foraminal stenosis. There was paravertebral soft tissue thickening from mid T3 to mid T5, likely representing edema related to the pathologic compression deformity vs. possible extraosseous extension of the neoplasm. There were no additional thoracic spinal metastases noted.  05/03/2018:  Head MRIrevealed no intracranial metastatic disease. 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 Incidental mention made of mild paranasal sinus disease. 01/10/2019:  Cervical and thoracic spine CT at [facility] unchanged severe compression deformity/vertebral plana of T4 with a stable degree of retropulsion and focal mild spinal canal stenosis. There was interval enlargement of a right lower lobe pulmonary mass(4.5 x 3.5 cm compared to 3.9 x 2.9 cm)with redemonstrated spiculated pulmonary nodules. 01/28/2019:  Chest, abdomen, pelvisCTrevealed slight interval enlargement of a right lower lobe mass with central necrosis measuring 4.6 x 3.4 cm, previously 4.0 x 3.0 cm. There was no change in right upper lobe nodules(1.4 and 0.8 cm).Therewas almost no residua of left lung nodules, with irregular opacities in the apical left upper lobeandsuperior segment left lower  lobe. There was no change in right hilar soft tissue and lymph nodes.There wasvertebra plana deformity of T4andno evidence of new osseous metastatic disease.There was no evidence of distant metastatic disease in the abdomen or pelvis.The1.6 x 1.1 cmleft adrenal nodule(non-metabolic on prior PET scan),was unchanged. 03/24/2019:  Renal ultrasoundrevealed no acute abnormality identified.There was no hydronephrosis or bladder distention. 04/23/2019:  Abdomen and pelvis CT revealed no acute abdominal or pelvic pathology. There was fluid in the colon which can be seen with diarrhea. There was diverticulosis without evidence of diverticulitis. There was a stable 1.5 cm left adrenal nodule. 05/06/2019:  Chest, abdomen, and pelvisCTrevealed a positive response to interval therapy for the known right lower lobe lung carcinoma(4.6 x 3.4 cm to3.1 x 2.8 cm transversely). There hadbeen a mild interval decrease contiguous soft tissue that extends along the right hilum. The two right upper lobe noduleswerestable from the most recent prior study (smaller than 04/27/2018).There are no new lung nodules.Therewasno evidence of new metastatic disease.There was stable severe compression deformity/vertebra plana of T4.Therewasno evidence of other osseous metastatic disease.There was a stable left adrenal nodule consistent with an adenoma. 05/30/2019:  ChestCT angiogramrevealed a tiny subsegmental pulmonary embolusin the right lower lobe. There areas of ground-glass at the periphery of the right chest hadbecome more conspicuous. This mayrelate to mild pneumonitis, perhaps due to radiation, attention on follow-up.The dominant nodule in the right lung basewasstable in size. Heis onEliquis. 08/14/2019:  Cervical spine MRIrevealed no evidence of metastatic disease within the cervical spine. 09/16/2019:  Chest, abdomen and pelvis CTrevealed asimilar-appearing spiculated right lower lobe  nodule.There was no evidence for metastatic disease in the abdomen or pelvis.There was  stable left adrenal adenoma. 12/15/2019:  Chest CT revealed an enlarging right lower lobe mass c/w malignancy (3.0 x 2.4 cm to 3.3 x 2.7 cm).  There was slight enlargement in a spiculated right upper lobe subpleural nodule (0.8 x 1.5 cm to 1.2 x 0.7 cm). 01/16/2020:  Bone scan revealed no scintigraphic evidence of osseous metastatic disease. 03/21/2020:  Chest CT angiogram revealed no evidence of pulmonary embolism.  A rounded spiculated mass in the RUL measuring 3.4 x 3 cm which was slightly enlarged with adjacent pleural thickening.  There was a stable 1.4 x 0.6 cm irregular subpleural density in the RUL.  There was stable severe compression deformity in the upper thoracic vertebral body consistent with an old fracture.   Infusion on 04/11/2020  Component Date Value Ref Range Status  . TSH 04/11/2020 0.690  0.350 - 4.500 uIU/mL Final   Comment: Performed by a 3rd Generation assay with a functional sensitivity of <=0.01 uIU/mL. Performed at [facility], [address] [place], [place] [address]   . Magnesium 04/11/2020 2.0  1.7 - 2.4 mg/dL Final   Performed at [facility], [address] [place], [place] [address]  . Sodium 04/11/2020 137  135 - 145 mmol/L Final  . Potassium 04/11/2020 4.3  3.5 - 5.1 mmol/L Final  . Chloride 04/11/2020 104  98 - 111 mmol/L Final  . CO2 04/11/2020 22  22 - 32 mmol/L Final  . Glucose, Bld 04/11/2020 169* 70 - 99 mg/dL Final   Glucose reference range applies only to samples taken after fasting for at least 8 hours.  . BUN 04/11/2020 9  8 - 23 mg/dL Final  . Creatinine, Ser 04/11/2020 1.24  0.61 - 1.24 mg/dL Final  . Calcium 04/11/2020 8.6* 8.9 - 10.3 mg/dL Final  . Total Protein 04/11/2020 7.0  6.5 - 8.1 g/dL Final  . Albumin 04/11/2020 3.4* 3.5 - 5.0 g/dL Final  . AST 04/11/2020 25  15 - 41 U/L Final  . ALT 04/11/2020 11  0 - 44 U/L Final  .  Alkaline Phosphatase 04/11/2020 86  38 - 126 U/L Final  . Total Bilirubin 04/11/2020 0.5  0.3 - 1.2 mg/dL Final  . GFR calc non Af Amer 04/11/2020 59* >60 mL/min Final  . GFR calc Af Amer 04/11/2020 >60  >60 mL/min Final  . Anion gap 04/11/2020 11  5 - 15 Final   Performed at [facility], [address], [place],  [address]  . WBC 04/11/2020 11.5* 4.0 - 10.5 K/uL Final  . RBC 04/11/2020 3.43* 4.22 - 5.81 MIL/uL Final  . Hemoglobin 04/11/2020 9.8* 13.0 - 17.0 g/dL Final  . HCT 04/11/2020 31.9* 39 - 52 % Final  . MCV 04/11/2020 93.0  80.0 - 100.0 fL Final  . MCH 04/11/2020 28.6  26.0 - 34.0 pg Final  . MCHC 04/11/2020 30.7  30.0 - 36.0 g/dL Final  . RDW 04/11/2020 15.9* 11.5 - 15.5 % Final  . Platelets 04/11/2020 453* 150 - 400 K/uL Final  . nRBC 04/11/2020 0.0  0.0 - 0.2 % Final  . Neutrophils Relative % 04/11/2020 92  % Final  . Neutro Abs 04/11/2020 10.5* 1.7 - 7.7 K/uL Final  . Lymphocytes Relative 04/11/2020 4  % Final  . Lymphs Abs 04/11/2020 0.5* 0.7 - 4.0 K/uL Final  . Monocytes Relative 04/11/2020 2  % Final  . Monocytes Absolute 04/11/2020 0.3  0 - 1 K/uL Final  . Eosinophils Relative 04/11/2020 0  %  Final  . Eosinophils Absolute 04/11/2020 0.0  0 - 0 K/uL Final  . Basophils Relative 04/11/2020 0  % Final  . Basophils Absolute 04/11/2020 0.0  0 - 0 K/uL Final  . Immature Granulocytes 04/11/2020 2  % Final  . Abs Immature Granulocytes 04/11/2020 0.19* 0.00 - 0.07 K/uL Final   Performed at [facility], [address] [place], [place] [address]  Appointment on 04/09/2020  Component Date Value Ref Range Status  . WBC 04/09/2020 12.6* 4.0 - 10.5 K/uL Final  . RBC 04/09/2020 3.34* 4.22 - 5.81 MIL/uL Final  . Hemoglobin 04/09/2020 9.8* 13.0 - 17.0 g/dL Final  . HCT 04/09/2020 31.3* 39 - 52 % Final  . MCV 04/09/2020 93.7  80.0 - 100.0 fL Final  . MCH 04/09/2020 29.3  26.0 - 34.0 pg Final  . MCHC 04/09/2020 31.3  30.0 - 36.0 g/dL Final  . RDW  04/09/2020 15.9* 11.5 - 15.5 % Final  . Platelets 04/09/2020 343  150 - 400 K/uL Final  . nRBC 04/09/2020 0.0  0.0 - 0.2 % Final  . Neutrophils Relative % 04/09/2020 78  % Final  . Neutro Abs 04/09/2020 10.0* 1.7 - 7.7 K/uL Final  . Lymphocytes Relative 04/09/2020 10  % Final  . Lymphs Abs 04/09/2020 1.2  0.7 - 4.0 K/uL Final  . Monocytes Relative 04/09/2020 7  % Final  . Monocytes Absolute 04/09/2020 0.8  0 - 1 K/uL Final  . Eosinophils Relative 04/09/2020 3  % Final  . Eosinophils Absolute 04/09/2020 0.4  0 - 0 K/uL Final  . Basophils Relative 04/09/2020 1  % Final  . Basophils Absolute 04/09/2020 0.1  0 - 0 K/uL Final  . Immature Granulocytes 04/09/2020 1  % Final  . Abs Immature Granulocytes 04/09/2020 0.11* 0.00 - 0.07 K/uL Final   Performed at [facility], [address], [place], [place] [address]    Assessment:  Jimmy West is a 69 y.o. male with metastatichigh-grade adenocarcinomaof the right lungs/p CT-guided biopsy of a RLL lung mass on 05/11/2018. Pathologyrevealed an invasive high-grade adenocarcinoma, with predominantly solid growth pattern. The neoplastic cells were TTF-1 (+), Napsin A (+), and P40 (+). He has a T4 vertebral metastasis. Clinical stage is T4N1M1.  There was not enough material for Foundation One testing. PDL-1revealed TPS 90%.  PET scanon 04/30/2018 revealed a 3.4 cm hypermetabolic RLL pulmonary mass (SUV 13.2), 11 mm pulmonary nodule in the LEFT lung (SUV 4.5), RIGHT paratracheal lymph node (SUV 5.1), and hypermetabolic activity within the T4 vertebral body (SUV 10.2).   Thoracic spine MRIon 05/03/2018 revealed T4 metastasiswith a 40% pathologic compression deformity and 5 mm of retropulsion of the vertebral body. Retropulsion results in mild spinal canal stenosis and mild bilateral C4-5 foraminal stenosis. There was paravertebral soft tissue thickening from mid T3 to mid T5, likely representing edema related to the  pathologic compression deformity vs. possible extraosseous extension of the neoplasm. There were no additional thoracic spinal metastases noted.   Head MRIon 05/03/2018 revealed no intracranial metastatic disease. 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 Incidental mention made of mild paranasal sinus disease.  Hereceived 11cycles ofpembrolizumab(05/24/2018 - 02/07/2019). He toleratedtreatment well. CEAwas 1.3 on 08/30/2018.LDHwas 165 on 11/29/2018.  He completed T4 radiationon 07/07/2018. He receives Xgevamonthly (06/03/2018 -03/12/2020).  He received 1 cycle of carboplatin, Alimta, and  pembrolizumabon 02/08/2019. Cycle #1 was complicated by nausea, vomiting, and diarrhea necessitating hospitalization.Decision made to hold carboplatin with his second dose.He iss/p2cycles ofAlimta and pembrolizumab (02/28/2019 - 03/29/2019).Cycle #2 was complicated by nausea, vomiting, and dehydration requring fluids in clinic and an overnight stay in the hospital. Hereceivedcarboplatin (AUC 2), Alimta, and pebrolizumab(04/18/2019).He tolerated it poorly.   Hehasreceived17 cycles ofAlimta and pembrolizumabon (05/09/2019 -03/22/2020). He began Fulphila with cycle #14 secondary to neutropenia.  He has tolerated chemotherapy well. He receives B12every 9 weeks (last09/07/2019).  He hascancer-related painin T4. He is off the [employer].  He is taking oxycodone10-58m every 4 hours prn.  Chest, abdomen and pelvis CTon 09/16/2019 revealed asimilar-appearing spiculated right lower lobe nodule.There was no evidence for metastatic disease in the abdomen or pelvis.There was stable left adrenal adenoma.  Chest CT on 12/15/2019 revealed an enlarging right lower lobe mass c/w malignancy (3.0 x  2.4 cm to 3.3 x 2.7 cm).  There was slight enlargement in a spiculated right upper lobe subpleural nodule (0.8 x 1.5 cm to 1.2 x 0.7 cm).  Bone scan on 01/16/2020 revealed no scintigraphic evidence of osseous metastatic disease.  Chest CT angiogram on 03/21/2020 revealed no evidence of pulmonary embolism.  A rounded spiculated 3.4 x 3 cm mass in the RUL was slightly enlarged with adjacent pleural thickening.  There was a stable 1.4 x 0.6 cm irregular subpleural density in the RUL.  There was stable severe compression deformity in the upper thoracic vertebral body consistent with an old fracture.  He has cervicalgia.Cervical spine MRIon 08/14/2019 showed no evidence of metastatic disease within the cervical spine. Heis followedby Dr YIzora Ribas  He has chemotherapy induced anemia.  He received Retacrit on 11/01/2019 (last 04/09/2020).  Hehad transient renal insufficiencyon 03/23/2019. Creatinine was 1.32 (baseline 0.61-1.0). Renal ultrasoundon 03/24/2019 revealed no acute abnormality identified.There was no hydronephrosis or bladder distention.Creatinine is 0.82 today.  Stool waspositive for C difficile + diarrheaon 06/14/2019.Hewas treated with oral vancomycin.  He received the influenza vaccineon 05/16/2019.   Symptomatically, he feels "alright".  He wishes to continue chemotherapy and pursue liquid biopsy testing.  Exam is stable.  Plan: 1.   Labs: CBC with diff, CMP, Mg, TSH. 2. Metastatic high-grade adenocarcinoma the RIGHT lung He is s/p 11 cycles of pembrolizumab. He is s/p 1 cycle of carboplatin, Almita, and pembrolizumab (02/08/2019). He is s/p 1 cycle of carboplatin (AUC 2), Alimta, and pembrolizumab (04/18/2019). He is s/p17cycles of Alimta and pembrolizumab (02/28/2019 - 03/29/2019; 05/09/2019-03/22/2020). Chest CT angiogram on 09/01/2021revealed slight growth in the RUL mass.  Discuss patient's thoughts about treatment.  He wishes to continue current  therapy.   He declines change in therapy to Taxotere.   Obtain liquid biopsy today.     Labs reviewed.  Begin cycle #18 Alimta and pembrolizumab.  Discuss symptom management.  He has antiemetics and pain medications at home to use on a prn bases.  Interventions are adequate.       3. Bone metastasis Symptomatically, bone pain is mild.  Bone scan on 01/16/2020 revealed no evidence of metastatic disease.  He last received Xgeva on 03/12/2020.  Continue monthly Xgeva. 4.Anxiety and depression Clinically, he continues to have anxiety and depression.   He is on low-dose Xanax as well as Prozac.  He canceled his appointment with psychiatry.  Psychiatrist: Dr. KUvaldo Rising([phone].  Patient is followed by JMilus Glazier NP (see note above).  Encourage patient to contact psychiatry. 5.Pulmonary embolism He remains on Eliquis.  He denies any bleeding.  Maintain platelets >  50,000. 6.Chemotherapy induced anemia Hematocrit31.9. Hemoglobin9.8. MCV 93.0today.  [phone] an iron saturation of 18% and a TIBC of312on 01/11/2020. Patient last received Retacrit on 04/09/2020.  Continue Retacrit per protocol 7.   Liquid biopsy today (before chemo). 8.   Cycle #18 Alimta and pembrolizumab. 9.   RTC tomorrow for Fulphila 10.   RTC in 3 weeks for MD assessment, labs (CBC with diff, CMP, Mg, TSH), Retacrit, and +/- Alimta and pembrolizumab.  I discussed the assessment and treatment plan with the patient.  The patient was provided an opportunity to ask questions and all were answered.  The patient agreed with the plan and demonstrated an understanding of the instructions.  The patient was advised to call back if the symptoms worsen or if the condition fails to improve as anticipated.   Lequita Asal, MD, PhD 04/11/2020, 8:29 PM  I, Jacqualyn Posey, am acting as a Education administrator for [employer]. Mike Gip, MD.   I,  Misty Rago C. Mike Gip, MD, have reviewed the above documentation for accuracy and completeness, and I agree with the above.

## 2020-04-11 ENCOUNTER — Inpatient Hospital Stay: Payer: Medicare Other

## 2020-04-11 ENCOUNTER — Inpatient Hospital Stay (HOSPITAL_BASED_OUTPATIENT_CLINIC_OR_DEPARTMENT_OTHER): Payer: Medicare Other | Admitting: Hematology and Oncology

## 2020-04-11 ENCOUNTER — Encounter: Payer: Self-pay | Admitting: Hematology and Oncology

## 2020-04-11 ENCOUNTER — Other Ambulatory Visit: Payer: Self-pay

## 2020-04-11 VITALS — BP 131/77 | HR 76 | Resp 16

## 2020-04-11 VITALS — BP 138/79 | HR 89 | Temp 96.5°F | Resp 18 | Wt 126.8 lb

## 2020-04-11 DIAGNOSIS — Z7189 Other specified counseling: Secondary | ICD-10-CM

## 2020-04-11 DIAGNOSIS — F329 Major depressive disorder, single episode, unspecified: Secondary | ICD-10-CM | POA: Diagnosis not present

## 2020-04-11 DIAGNOSIS — R531 Weakness: Secondary | ICD-10-CM

## 2020-04-11 DIAGNOSIS — F419 Anxiety disorder, unspecified: Secondary | ICD-10-CM

## 2020-04-11 DIAGNOSIS — G893 Neoplasm related pain (acute) (chronic): Secondary | ICD-10-CM | POA: Diagnosis not present

## 2020-04-11 DIAGNOSIS — C3431 Malignant neoplasm of lower lobe, right bronchus or lung: Secondary | ICD-10-CM

## 2020-04-11 DIAGNOSIS — R530 Neoplastic (malignant) related fatigue: Secondary | ICD-10-CM

## 2020-04-11 DIAGNOSIS — D6481 Anemia due to antineoplastic chemotherapy: Secondary | ICD-10-CM | POA: Diagnosis not present

## 2020-04-11 DIAGNOSIS — C7951 Secondary malignant neoplasm of bone: Secondary | ICD-10-CM

## 2020-04-11 DIAGNOSIS — B349 Viral infection, unspecified: Secondary | ICD-10-CM

## 2020-04-11 DIAGNOSIS — Z5111 Encounter for antineoplastic chemotherapy: Secondary | ICD-10-CM

## 2020-04-11 DIAGNOSIS — Z5112 Encounter for antineoplastic immunotherapy: Secondary | ICD-10-CM

## 2020-04-11 DIAGNOSIS — R7989 Other specified abnormal findings of blood chemistry: Secondary | ICD-10-CM

## 2020-04-11 DIAGNOSIS — Z923 Personal history of irradiation: Secondary | ICD-10-CM | POA: Diagnosis not present

## 2020-04-11 LAB — CBC WITH DIFFERENTIAL/PLATELET
Abs Immature Granulocytes: 0.19 10*3/uL — ABNORMAL HIGH (ref 0.00–0.07)
Basophils Absolute: 0 10*3/uL (ref 0.0–0.1)
Basophils Relative: 0 %
Eosinophils Absolute: 0 10*3/uL (ref 0.0–0.5)
Eosinophils Relative: 0 %
HCT: 31.9 % — ABNORMAL LOW (ref 39.0–52.0)
Hemoglobin: 9.8 g/dL — ABNORMAL LOW (ref 13.0–17.0)
Immature Granulocytes: 2 %
Lymphocytes Relative: 4 %
Lymphs Abs: 0.5 10*3/uL — ABNORMAL LOW (ref 0.7–4.0)
MCH: 28.6 pg (ref 26.0–34.0)
MCHC: 30.7 g/dL (ref 30.0–36.0)
MCV: 93 fL (ref 80.0–100.0)
Monocytes Absolute: 0.3 10*3/uL (ref 0.1–1.0)
Monocytes Relative: 2 %
Neutro Abs: 10.5 10*3/uL — ABNORMAL HIGH (ref 1.7–7.7)
Neutrophils Relative %: 92 %
Platelets: 453 10*3/uL — ABNORMAL HIGH (ref 150–400)
RBC: 3.43 MIL/uL — ABNORMAL LOW (ref 4.22–5.81)
RDW: 15.9 % — ABNORMAL HIGH (ref 11.5–15.5)
WBC: 11.5 10*3/uL — ABNORMAL HIGH (ref 4.0–10.5)
nRBC: 0 % (ref 0.0–0.2)

## 2020-04-11 LAB — COMPREHENSIVE METABOLIC PANEL
ALT: 11 U/L (ref 0–44)
AST: 25 U/L (ref 15–41)
Albumin: 3.4 g/dL — ABNORMAL LOW (ref 3.5–5.0)
Alkaline Phosphatase: 86 U/L (ref 38–126)
Anion gap: 11 (ref 5–15)
BUN: 9 mg/dL (ref 8–23)
CO2: 22 mmol/L (ref 22–32)
Calcium: 8.6 mg/dL — ABNORMAL LOW (ref 8.9–10.3)
Chloride: 104 mmol/L (ref 98–111)
Creatinine, Ser: 1.24 mg/dL (ref 0.61–1.24)
GFR calc Af Amer: >60 mL/min (ref >60)
GFR calc non Af Amer: 59 mL/min — ABNORMAL LOW (ref >60)
Glucose, Bld: 169 mg/dL — ABNORMAL HIGH (ref 70–99)
Potassium: 4.3 mmol/L (ref 3.5–5.1)
Sodium: 137 mmol/L (ref 135–145)
Total Bilirubin: 0.5 mg/dL (ref 0.3–1.2)
Total Protein: 7 g/dL (ref 6.5–8.1)

## 2020-04-11 LAB — MAGNESIUM: Magnesium: 2 mg/dL (ref 1.7–2.4)

## 2020-04-11 LAB — TSH: TSH: 0.69 u[IU]/mL (ref 0.350–4.500)

## 2020-04-11 MED ORDER — SODIUM CHLORIDE 0.9 % IV SOLN
Freq: Once | INTRAVENOUS | Status: AC
  Filled 2020-04-11: qty 250

## 2020-04-11 MED ORDER — SODIUM CHLORIDE 0.9 % IV SOLN
400.0000 mg/m2 | Freq: Once | INTRAVENOUS | Status: AC
  Administered 2020-04-11: 700 mg via INTRAVENOUS
  Filled 2020-04-11: qty 20

## 2020-04-11 MED ORDER — PALONOSETRON HCL INJECTION 0.25 MG/5ML
0.2500 mg | Freq: Once | INTRAVENOUS | Status: AC
  Administered 2020-04-11: 0.25 mg via INTRAVENOUS
  Filled 2020-04-11: qty 5

## 2020-04-11 MED ORDER — SODIUM CHLORIDE 0.9 % IV SOLN
200.0000 mg | Freq: Once | INTRAVENOUS | Status: AC
  Administered 2020-04-11: 200 mg via INTRAVENOUS
  Filled 2020-04-11: qty 8

## 2020-04-11 MED ORDER — HEPARIN SOD (PORK) LOCK FLUSH 100 UNIT/ML IV SOLN
500.0000 [IU] | Freq: Once | INTRAVENOUS | Status: AC | PRN
  Administered 2020-04-11: 500 [IU]
  Filled 2020-04-11: qty 5

## 2020-04-11 MED ORDER — SODIUM CHLORIDE 0.9 % IV SOLN
10.0000 mg | Freq: Once | INTRAVENOUS | Status: AC
  Administered 2020-04-11: 10 mg via INTRAVENOUS
  Filled 2020-04-11: qty 1

## 2020-04-11 NOTE — Progress Notes (Signed)
Patient here for oncology follow-up appointment, expresses no complaints. States his depression is better since starting the Prozac.

## 2020-04-12 ENCOUNTER — Inpatient Hospital Stay: Payer: Medicare Other

## 2020-04-12 VITALS — BP 116/70 | HR 70 | Resp 18

## 2020-04-12 DIAGNOSIS — C3431 Malignant neoplasm of lower lobe, right bronchus or lung: Secondary | ICD-10-CM

## 2020-04-12 DIAGNOSIS — C7951 Secondary malignant neoplasm of bone: Secondary | ICD-10-CM | POA: Diagnosis not present

## 2020-04-12 DIAGNOSIS — D6481 Anemia due to antineoplastic chemotherapy: Secondary | ICD-10-CM | POA: Diagnosis not present

## 2020-04-12 DIAGNOSIS — F419 Anxiety disorder, unspecified: Secondary | ICD-10-CM | POA: Diagnosis not present

## 2020-04-12 DIAGNOSIS — Z5111 Encounter for antineoplastic chemotherapy: Secondary | ICD-10-CM | POA: Diagnosis not present

## 2020-04-12 DIAGNOSIS — Z923 Personal history of irradiation: Secondary | ICD-10-CM | POA: Diagnosis not present

## 2020-04-12 MED ORDER — PEGFILGRASTIM-JMDB 6 MG/0.6ML ~~LOC~~ SOSY
6.0000 mg | PREFILLED_SYRINGE | Freq: Once | SUBCUTANEOUS | Status: AC
  Administered 2020-04-12: 6 mg via SUBCUTANEOUS
  Filled 2020-04-12: qty 0.6

## 2020-04-19 DIAGNOSIS — C3431 Malignant neoplasm of lower lobe, right bronchus or lung: Secondary | ICD-10-CM | POA: Diagnosis not present

## 2020-04-23 ENCOUNTER — Other Ambulatory Visit: Payer: Self-pay | Admitting: *Deleted

## 2020-04-23 DIAGNOSIS — G893 Neoplasm related pain (acute) (chronic): Secondary | ICD-10-CM

## 2020-04-23 DIAGNOSIS — F419 Anxiety disorder, unspecified: Secondary | ICD-10-CM

## 2020-04-23 DIAGNOSIS — C3431 Malignant neoplasm of lower lobe, right bronchus or lung: Secondary | ICD-10-CM

## 2020-04-23 MED ORDER — FOLIC ACID 1 MG PO TABS: 1.0000 mg | ORAL_TABLET | Freq: Every day | ORAL | 3 refills | Status: DC

## 2020-04-23 MED ORDER — OXYCODONE HCL 20 MG PO TABS: ORAL_TABLET | ORAL | 0 refills | Status: DC

## 2020-04-23 MED ORDER — ALPRAZOLAM 0.5 MG PO TABS: 0.5000 mg | ORAL_TABLET | Freq: Four times a day (QID) | ORAL | 0 refills | Status: DC | PRN

## 2020-04-23 MED ORDER — FLUOXETINE HCL 40 MG PO CAPS: 40.0000 mg | ORAL_CAPSULE | Freq: Every day | ORAL | 3 refills | Status: DC

## 2020-04-23 MED ORDER — DEXAMETHASONE 4 MG PO TABS: ORAL_TABLET | ORAL | 1 refills | Status: DC

## 2020-04-27 ENCOUNTER — Other Ambulatory Visit: Payer: Self-pay | Admitting: Hematology and Oncology

## 2020-04-27 ENCOUNTER — Telehealth: Payer: Self-pay | Admitting: Hematology and Oncology

## 2020-04-27 ENCOUNTER — Telehealth: Payer: Self-pay

## 2020-04-27 DIAGNOSIS — C3431 Malignant neoplasm of lower lobe, right bronchus or lung: Secondary | ICD-10-CM

## 2020-04-27 NOTE — Telephone Encounter (Signed)
Spoke with the patient to get him schedule for COVID testing. I have informed the patient that he is schedule for 04/30/2020 @ 11:00 AM. The patient was understanding and agreeable. I have provided the patient with the address where he will need to go. [place] [address]. The patient was agreeable.

## 2020-04-27 NOTE — Telephone Encounter (Signed)
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  There are no alterations.  The tumor mutational burden was 1 Muts/Mb.  MSI high was not detected.  CHEK2 G 69*,  KRAS G12D, TP53 G245S.  There are no actionable mutations.  We discussed his thoughts about obtaining new tumor tissue for Foundation One.  He was interested.   I discussed obtaining tissue via bronchoscopy or CT-guided biopsy.  I believe the patient has seen Dr. Patsey Berthold in the past.  She is currently out of town but will be back on Monday, 04/30/2020.  I also spoke with interventional radiology this morning.  The patient stated that he would rather have a CT-guided biopsy than a bronchoscopy.  The risk and benefits of each procedure were discussed in detail.  Bronchoscopy was felt to be slightly decreased risk.  He would need to be off his Eliquis for 2 days prior to biopsy.  He would need COVID-19 testing.  He would need a ride to and from the procedure.  We discussed to having the procedure done at [facility] versus [place] based on availability.  Patient requested that the procedure be done preferrably at [facility]. Several questions were asked and answered.   Lequita Asal, MD

## 2020-04-30 ENCOUNTER — Other Ambulatory Visit: Payer: Medicare Other

## 2020-04-30 DIAGNOSIS — Z20822 Contact with and (suspected) exposure to covid-19: Secondary | ICD-10-CM

## 2020-05-02 ENCOUNTER — Telehealth: Payer: Self-pay

## 2020-05-02 ENCOUNTER — Inpatient Hospital Stay: Payer: Medicare Other | Attending: Hematology and Oncology

## 2020-05-02 ENCOUNTER — Ambulatory Visit: Payer: Medicare Other | Admitting: Hematology and Oncology

## 2020-05-02 ENCOUNTER — Other Ambulatory Visit: Payer: Medicare Other

## 2020-05-02 DIAGNOSIS — Z7901 Long term (current) use of anticoagulants: Secondary | ICD-10-CM | POA: Insufficient documentation

## 2020-05-02 LAB — SARS-COV-2, NAA 2 DAY TAT

## 2020-05-02 LAB — NOVEL CORONAVIRUS, NAA: SARS-CoV-2, NAA: NOT DETECTED

## 2020-05-02 NOTE — Telephone Encounter (Signed)
Spoke with patients friend, [name], let her know that patient is scheduled for lung biopsy Wednesday October 20th @9 :30 (biopsy is at 1030). Last day for Eliquis is 10/17 and to go get COVID tested on 10/18 between 8-1. She states that she will let patient know and have him to call us.

## 2020-05-04 ENCOUNTER — Other Ambulatory Visit: Payer: Self-pay | Admitting: Radiology

## 2020-05-07 ENCOUNTER — Other Ambulatory Visit: Payer: Self-pay | Admitting: *Deleted

## 2020-05-07 ENCOUNTER — Other Ambulatory Visit: Payer: Self-pay

## 2020-05-07 ENCOUNTER — Telehealth: Payer: Self-pay

## 2020-05-07 ENCOUNTER — Other Ambulatory Visit
Admission: RE | Admit: 2020-05-07 | Discharge: 2020-05-07 | Disposition: A | Discharge status: Home / Self Care | Payer: Medicare Other | Source: Ambulatory Visit | Attending: Hematology and Oncology | Admitting: Hematology and Oncology

## 2020-05-07 DIAGNOSIS — Z20822 Contact with and (suspected) exposure to covid-19: Secondary | ICD-10-CM | POA: Insufficient documentation

## 2020-05-07 DIAGNOSIS — F419 Anxiety disorder, unspecified: Secondary | ICD-10-CM

## 2020-05-07 DIAGNOSIS — Z01812 Encounter for preprocedural laboratory examination: Secondary | ICD-10-CM | POA: Diagnosis not present

## 2020-05-07 DIAGNOSIS — G893 Neoplasm related pain (acute) (chronic): Secondary | ICD-10-CM

## 2020-05-07 MED ORDER — OXYCODONE HCL 20 MG PO TABS: ORAL_TABLET | ORAL | 0 refills | Status: DC

## 2020-05-07 MED ORDER — ALPRAZOLAM 0.5 MG PO TABS: 0.5000 mg | ORAL_TABLET | Freq: Four times a day (QID) | ORAL | 0 refills | Status: DC | PRN

## 2020-05-07 NOTE — Telephone Encounter (Signed)
Spoke with the patient to inform him Just a reminder No Elquis today and tomorrow. They will do the proiducer on Wednesday. the patient was understanding and agreeable.

## 2020-05-08 ENCOUNTER — Telehealth: Payer: Self-pay | Admitting: *Deleted

## 2020-05-08 LAB — SARS CORONAVIRUS 2 (TAT 6-24 HRS): SARS Coronavirus 2: NEGATIVE

## 2020-05-08 NOTE — Telephone Encounter (Signed)
Pharmacy called stating that she only had 15 tabs of Oxycodone 20 mg IR due to it being on backorder and she will not have any more until next month if then. This will only last him 3 days. Do you want to send another prescription for 10 mg tabs to take 2 tabs. She has the 10 mg tabs on hand. Please advise.

## 2020-05-08 NOTE — Progress Notes (Signed)
Patient on schedule for CT Lung Biopsy 05/09/2020, spoke with patient on phone and made aware to be here @ 0930, NPO after Mn prior to procedure as well as driver for discharge post procedure. Stated understanding.

## 2020-05-09 ENCOUNTER — Other Ambulatory Visit: Payer: Self-pay

## 2020-05-09 ENCOUNTER — Telehealth: Payer: Self-pay

## 2020-05-09 ENCOUNTER — Ambulatory Visit
Admission: RE | Admit: 2020-05-09 | Discharge: 2020-05-09 | Disposition: A | Discharge status: Home / Self Care | Payer: Medicare Other | Source: Ambulatory Visit | Attending: Interventional Radiology | Admitting: Interventional Radiology

## 2020-05-09 ENCOUNTER — Ambulatory Visit
Admission: RE | Admit: 2020-05-09 | Discharge: 2020-05-09 | Disposition: A | Discharge status: Home / Self Care | Payer: Medicare Other | Source: Ambulatory Visit | Attending: Hematology and Oncology | Admitting: Hematology and Oncology

## 2020-05-09 DIAGNOSIS — Z85118 Personal history of other malignant neoplasm of bronchus and lung: Secondary | ICD-10-CM | POA: Insufficient documentation

## 2020-05-09 DIAGNOSIS — Z79899 Other long term (current) drug therapy: Secondary | ICD-10-CM | POA: Insufficient documentation

## 2020-05-09 DIAGNOSIS — I2510 Atherosclerotic heart disease of native coronary artery without angina pectoris: Secondary | ICD-10-CM | POA: Insufficient documentation

## 2020-05-09 DIAGNOSIS — Z87891 Personal history of nicotine dependence: Secondary | ICD-10-CM | POA: Insufficient documentation

## 2020-05-09 DIAGNOSIS — R918 Other nonspecific abnormal finding of lung field: Secondary | ICD-10-CM | POA: Diagnosis not present

## 2020-05-09 DIAGNOSIS — R911 Solitary pulmonary nodule: Secondary | ICD-10-CM | POA: Diagnosis not present

## 2020-05-09 DIAGNOSIS — C3431 Malignant neoplasm of lower lobe, right bronchus or lung: Secondary | ICD-10-CM | POA: Diagnosis not present

## 2020-05-09 DIAGNOSIS — Z9889 Other specified postprocedural states: Secondary | ICD-10-CM

## 2020-05-09 DIAGNOSIS — Z7901 Long term (current) use of anticoagulants: Secondary | ICD-10-CM | POA: Insufficient documentation

## 2020-05-09 LAB — CBC
HCT: 31.8 % — ABNORMAL LOW (ref 39.0–52.0)
Hemoglobin: 10 g/dL — ABNORMAL LOW (ref 13.0–17.0)
MCH: 29.2 pg (ref 26.0–34.0)
MCHC: 31.4 g/dL (ref 30.0–36.0)
MCV: 92.7 fL (ref 80.0–100.0)
Platelets: 287 10*3/uL (ref 150–400)
RBC: 3.43 MIL/uL — ABNORMAL LOW (ref 4.22–5.81)
RDW: 17.4 % — ABNORMAL HIGH (ref 11.5–15.5)
WBC: 11.5 10*3/uL — ABNORMAL HIGH (ref 4.0–10.5)
nRBC: 0 % (ref 0.0–0.2)

## 2020-05-09 LAB — PROTIME-INR
INR: 1 (ref 0.8–1.2)
Prothrombin Time: 12.7 seconds (ref 11.4–15.2)

## 2020-05-09 MED ORDER — MIDAZOLAM HCL 5 MG/5ML IJ SOLN
INTRAMUSCULAR | Status: AC
  Filled 2020-05-09: qty 5

## 2020-05-09 MED ORDER — FENTANYL CITRATE (PF) 100 MCG/2ML IJ SOLN
INTRAMUSCULAR | Status: AC
  Filled 2020-05-09: qty 2

## 2020-05-09 MED ORDER — FENTANYL CITRATE (PF) 100 MCG/2ML IJ SOLN
INTRAMUSCULAR | Status: AC | PRN
Start: 2020-05-09 — End: 2020-05-09
  Administered 2020-05-09 (×2): 25 ug via INTRAVENOUS

## 2020-05-09 MED ORDER — SODIUM CHLORIDE 0.9 % IV SOLN: INTRAVENOUS | Status: DC

## 2020-05-09 MED ORDER — MIDAZOLAM HCL 5 MG/5ML IJ SOLN
INTRAMUSCULAR | Status: AC | PRN
  Administered 2020-05-09 (×2): 0.5 mg via INTRAVENOUS

## 2020-05-09 NOTE — Procedures (Signed)
Pre procedural Dx: Right lower lobe nodule  Post procedural Dx: Same  Technically successful CT guided biopsy of enlarging right lower lobe pulmonary nodule.    EBL: None.   Complications: None immediate.   Ronny Bacon, MD Pager #: [phone]

## 2020-05-09 NOTE — Telephone Encounter (Signed)
The patient has been ok to restate Elquis tomorrow morning. The patient has been informed.

## 2020-05-09 NOTE — Consult Note (Signed)
Chief Complaint: Lung cancer  Referring Physician(s): Dauphin C  Patient Status: [facility] - Out-pt  History of Present Illness: Jimmy West is a 69 y.o. male with past medical history significant for CAD presents today for CT-guided biopsy right lower lobe nodule/mass for the acquisition of additional tissue for additional molecular testing.  The patient is familiar with CT-guided lung biopsies previous biopsy by Dr. Kathlene Cote 05/11/2018 at the time of initial diagnosis.  Patient is currently without complaint.  Specifically, no change in appetite or energy level.  No unintentional weight loss.  No chest pain, shortness of breath chills.  No cough or hemoptysis.  Past Medical History:  Diagnosis Date  . Bulging lumbar disc   . Cancer ([place])    stage 4 lung cancer  . Heart attack [facility])     Past Surgical History:  Procedure Laterality Date  . CARDIAC CATHETERIZATION     two stents  . KNEE SURGERY Left   . PORTA CATH INSERTION N/A 05/21/2018   Procedure: PORTA CATH INSERTION;  Surgeon: Algernon Huxley, MD;  Location: [place] [facility];  Service: Cardiovascular;  Laterality: N/A;    Allergies: Patient has no known allergies.  Medications: Prior to Admission medications   Medication Sig Start Date End Date Taking? Authorizing Provider  ALPRAZolam Duanne Moron) 0.5 MG tablet Take 1 tablet (0.5 mg total) by mouth 4 (four) times daily as needed for anxiety. 05/07/20  Yes Borders, Kirt Boys, NP  Calcium 600-400 MG-UNIT CHEW Chew 2 tablets by mouth daily.   Yes [provider]  FLUoxetine (PROZAC) 40 MG capsule Take 1 capsule (40 mg total) by mouth daily. 04/23/20  Yes Borders, Kirt Boys, NP  folic acid (FOLVITE) 1 MG tablet Take 1 tablet (1 mg total) by mouth daily. Continue until 21 days after Alimta completed. 04/23/20  Yes Corcoran, Melissa C, MD  Oxycodone HCl 20 MG TABS TAKE 1/2 TO 1 TABLET BY MOUTH EVERY 4 HOURS AS NEEDED FOR SEVERE PAIN 05/07/20  Yes Borders,  Kirt Boys, NP  senna (SENOKOT) 8.6 MG tablet Take 1 tablet by mouth as needed.    Yes [provider]  acetaminophen (TYLENOL) 500 MG tablet Take 500 mg by mouth 3 (three) times daily as needed.  Patient not taking: Reported on 04/11/2020    [provider]  albuterol (VENTOLIN HFA) 108 (90 Base) MCG/ACT inhaler Inhale 2 puffs into the lungs every 6 (six) hours as needed for wheezing or shortness of breath. Patient not taking: Reported on 04/11/2020 06/15/19   Tyler Pita, MD  dexamethasone (DECADRON) 4 MG tablet Take 1 tab two times a day the day before Alimta chemo, then take 2 tabs once a day for 3 days starting the day after chemo. 04/23/20   Corcoran, Drue Second, MD  ELIQUIS 5 MG TABS tablet TAKE 1 TABLET BY MOUTH TWICE DAILY 11/30/19   Nolon Stalls C, MD  ondansetron (ZOFRAN-ODT) 8 MG disintegrating tablet DISSOLVE 1 TABLET ON TONGUE EVERY 8 HOURS AS NEEDED FOR NAUSEA OR VOMITING 03/28/20   Lequita Asal, MD  polyethylene glycol (MIRALAX / GLYCOLAX) packet Take 17 g by mouth daily.    [provider]     Family History  Problem Relation Age of Onset  . Heart failure Father   . Cancer Maternal Aunt   . Heart failure Maternal Uncle   . Dementia Paternal Grandmother   . Cancer Maternal Aunt     Social History   Socioeconomic History  .  Marital status: Single    Spouse name: Not on file  . Number of children: Not on file  . Years of education: Not on file  . Highest education level: Not on file  Occupational History  . Not on file  Tobacco Use  . Smoking status: Former Smoker    Packs/day: 1.50    Years: 15.00    Pack years: 22.50    Types: Cigarettes    Quit date: 11/03/2003    Years since quitting: 16.5  . Smokeless tobacco: Never Used  Vaping Use  . Vaping Use: Never used  Substance and Sexual Activity  . Alcohol use: Not Currently  . Drug use: No  . Sexual activity: Not Currently  Other Topics Concern  . Not on file  Social  History Narrative  . Not on file   Social Determinants of Health   Financial Resource Strain:   . Difficulty of Paying Living Expenses: Not on file  Food Insecurity:   . Worried About Charity fundraiser in the Last Year: Not on file  . Ran Out of Food in the Last Year: Not on file  Transportation Needs:   . Lack of Transportation (Medical): Not on file  . Lack of Transportation (Non-Medical): Not on file  Physical Activity:   . Days of Exercise per Week: Not on file  . Minutes of Exercise per Session: Not on file  Stress:   . Feeling of Stress : Not on file  Social Connections:   . Frequency of Communication with Friends and Family: Not on file  . Frequency of Social Gatherings with Friends and Family: Not on file  . Attends Religious Services: Not on file  . Active Member of Clubs or Organizations: Not on file  . Attends Archivist Meetings: Not on file  . Marital Status: Not on file    ECOG Status: 1 - Symptomatic but completely ambulatory  Review of Systems: A 12 point ROS discussed and pertinent positives are indicated in the HPI above.  All other systems are negative.  Review of Systems  Vital Signs: BP 122/79   Pulse 87   Temp 98.1 F (36.7 C) (Oral)   Resp 19   Ht 5\' 6"  (1.676 m)   Wt 56.7 kg   SpO2 96%   BMI 20.18 kg/m   Physical Exam  Imaging: No results found.  Labs:  CBC: Recent Labs    03/21/20 0904 04/09/20 0951 04/11/20 0905 05/09/20 1014  WBC 7.7 12.6* 11.5* 11.5*  HGB 9.0* 9.8* 9.8* 10.0*  HCT 29.3* 31.3* 31.9* 31.8*  PLT 345 343 453* 287    COAGS: Recent Labs    05/09/20 1014  INR 1.0    BMP: Recent Labs    02/29/20 0909 03/12/20 1401 03/21/20 0904 04/11/20 0905  NA 136 137 139 137  K 3.5 3.5 3.6 4.3  CL 104 105 102 104  CO2 24 25 26 22   GLUCOSE 121* 110* 134* 169*  BUN 14 7* 17 9  CALCIUM 9.3 8.7* 9.8 8.6*  CREATININE 1.17 1.06 1.19 1.24  GFRNONAA >60 >60 >60 59*  GFRAA >60 >60 >60 >60    LIVER  FUNCTION TESTS: Recent Labs    02/08/20 0905 02/29/20 0909 03/21/20 0904 04/11/20 0905  BILITOT 0.6 0.7 0.5 0.5  AST 26 22 24 25   ALT 16 12 13 11   ALKPHOS 84 77 68 86  PROT 6.9 7.1 6.6 7.0  ALBUMIN 3.4* 3.4* 3.4* 3.4*  TUMOR MARKERS: No results for input(s): AFPTM, CEA, CA199, CHROMGRNA in the last 8760 hours.  Assessment and Plan:  Huxley Shurley is a 69 y.o. male with past medical history significant for CAD presents today for CT-guided biopsy right lower lobe nodule/mass for the acquisition of additional tissue for additional molecular testing.  Patient is currently without complaint.  Specifically, no change in appetite or energy level.  No unintentional weight loss.  No chest pain, shortness of breath chills.  No cough or hemoptysis.  Patient was offered referral for attempted bronchoscopic biopsy but wishes to pursue CT-guided biopsy as he tolerated the previous CT-guided biopsy in 2019 well.   Risks and benefits of CT guided lung nodule biopsy was discussed with the patient including, but not limited to bleeding, hemoptysis, respiratory failure requiring intubation, infection, pneumothorax requiring chest tube placement, stroke from air embolism or even death.  All of the patient's questions were answered and the patient is agreeable to proceed.  Consent signed and in chart.    Thank you for this interesting consult.  I greatly enjoyed meeting Stacey Maura and look forward to participating in their care.  A copy of this report was sent to the requesting provider on this date.  Electronically Signed: Sandi Mariscal, MD 05/09/2020, 11:15 AM   I spent a total of 15 Minutes in face to face in clinical consultation, greater than 50% of which was counseling/coordinating care for CT guided right lower lobe pulmonary nodule biopsy.

## 2020-05-09 NOTE — Sedation Documentation (Signed)
Total sedation: Versed 1 mg IV, Fentanyl 50 mcg IV. Pt. Tolerated procedure well

## 2020-05-10 ENCOUNTER — Other Ambulatory Visit: Payer: Self-pay | Admitting: Hospice and Palliative Medicine

## 2020-05-10 MED ORDER — OXYCODONE HCL 10 MG PO TABS
10.0000 mg | ORAL_TABLET | ORAL | 0 refills | Status: DC | PRN
Start: 2020-05-10 — End: 2020-05-18

## 2020-05-10 NOTE — Progress Notes (Signed)
Pharmacy did not have oxycodone 20mg  in stock. Will send Rx for 10mg  tablets.

## 2020-05-16 LAB — SURGICAL PATHOLOGY

## 2020-05-18 ENCOUNTER — Other Ambulatory Visit: Payer: Self-pay | Admitting: *Deleted

## 2020-05-18 MED ORDER — OXYCODONE HCL 10 MG PO TABS
10.0000 mg | ORAL_TABLET | ORAL | 0 refills | Status: DC | PRN
Start: 2020-05-18 — End: 2020-06-11

## 2020-05-21 ENCOUNTER — Other Ambulatory Visit: Payer: Self-pay | Admitting: *Deleted

## 2020-05-21 DIAGNOSIS — F419 Anxiety disorder, unspecified: Secondary | ICD-10-CM

## 2020-05-21 MED ORDER — ALPRAZOLAM 0.5 MG PO TABS: 0.5000 mg | ORAL_TABLET | Freq: Four times a day (QID) | ORAL | 0 refills | Status: DC | PRN

## 2020-05-23 ENCOUNTER — Other Ambulatory Visit: Payer: Self-pay

## 2020-05-23 ENCOUNTER — Encounter: Payer: Self-pay | Admitting: Hematology and Oncology

## 2020-05-23 ENCOUNTER — Inpatient Hospital Stay: Payer: Medicare Other | Attending: Hospice and Palliative Medicine | Admitting: Hospice and Palliative Medicine

## 2020-05-23 DIAGNOSIS — C3431 Malignant neoplasm of lower lobe, right bronchus or lung: Secondary | ICD-10-CM | POA: Diagnosis not present

## 2020-05-23 DIAGNOSIS — I252 Old myocardial infarction: Secondary | ICD-10-CM | POA: Insufficient documentation

## 2020-05-23 DIAGNOSIS — C3491 Malignant neoplasm of unspecified part of right bronchus or lung: Secondary | ICD-10-CM | POA: Diagnosis not present

## 2020-05-23 DIAGNOSIS — Z515 Encounter for palliative care: Secondary | ICD-10-CM | POA: Diagnosis not present

## 2020-05-23 DIAGNOSIS — Z9221 Personal history of antineoplastic chemotherapy: Secondary | ICD-10-CM | POA: Diagnosis not present

## 2020-05-23 DIAGNOSIS — Z79899 Other long term (current) drug therapy: Secondary | ICD-10-CM | POA: Insufficient documentation

## 2020-05-23 DIAGNOSIS — F419 Anxiety disorder, unspecified: Secondary | ICD-10-CM | POA: Insufficient documentation

## 2020-05-23 DIAGNOSIS — F32A Depression, unspecified: Secondary | ICD-10-CM | POA: Diagnosis not present

## 2020-05-23 DIAGNOSIS — Z79891 Long term (current) use of opiate analgesic: Secondary | ICD-10-CM | POA: Diagnosis not present

## 2020-05-23 DIAGNOSIS — G893 Neoplasm related pain (acute) (chronic): Secondary | ICD-10-CM

## 2020-05-23 NOTE — Progress Notes (Signed)
[place]  [phone] [phone]   Name: Jimmy West Date: 05/23/2020 MRN: [id number]  DOB: [birth year]  Patient Care Team: [name], NP as PCP - General (Nurse Practitioner) Telford Nab, RN as Registered Nurse McShane, Gerda Diss, MD as Attending Physician (Emergency Medicine) Lebron Conners, [place] (Physician Assistant) Wonda Horner, MD as Referring Physician (Neurosurgery) Lequita Asal, MD as Referring Physician (Hematology and Oncology)    REASON FOR CONSULTATION: Mr. 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 Patient has had chronic neoplasm related pain and severe anxiety/depression.  He was referred to palliative care clinic to assist with symptom management and to establish treatment goals.  SOCIAL HISTORY:    Patient has not married and has no children.  He has a brother who lives out of state.  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  ADVANCE DIRECTIVES:  Does not have.  CODE STATUS: Full code  PAST MEDICAL HISTORY: Past Medical History:  Diagnosis Date  . Bulging lumbar disc   . Cancer ([place])    stage 4 lung cancer  . Heart attack ([place])     PAST SURGICAL HISTORY:  Past Surgical History:  Procedure Laterality Date  . CARDIAC CATHETERIZATION     two stents  . KNEE SURGERY Left   . PORTA CATH INSERTION N/A 05/21/2018   Procedure: PORTA CATH INSERTION;  Surgeon: Algernon Huxley, MD;  Location: [facility];  Service: Cardiovascular;  Laterality: N/A;    HEMATOLOGY/ONCOLOGY HISTORY:  Oncology History  Primary cancer of right lower lobe of lung ([place])  04/26/2018 Initial Diagnosis   Primary cancer of right lower lobe of lung ([place])   05/24/2018 - 01/10/2019 Chemotherapy   The patient had dexamethasone (DECADRON) 4 MG tablet, 1  of 1 cycle, Start date: 05/17/2018, End date: 08/12/2018 pembrolizumab (KEYTRUDA) 200 mg in sodium chloride 0.9 % 50 mL chemo infusion, 200 mg, Intravenous, Once, 11 of 14 cycles Administration: 200 mg (05/24/2018), 200 mg (07/16/2018), 200 mg (08/09/2018), 200 mg (08/30/2018), 200 mg (09/20/2018), 200 mg (06/14/2018), 200 mg (10/11/2018), 200 mg (11/08/2018), 200 mg (11/29/2018), 200 mg (12/20/2018), 200 mg (01/10/2019)  for chemotherapy treatment.    02/08/2019 -  Chemotherapy   The patient had dexamethasone (DECADRON) 4 MG tablet, 1 of 1 cycle, Start date: 06/20/2019, End date: -- palonosetron (ALOXI) injection 0.25 mg, 0.25 mg, Intravenous,  Once, 21 of 23 cycles Administration: 0.25 mg (02/08/2019), 0.25 mg (03/29/2019), 0.25 mg (04/18/2019), 0.25 mg (02/28/2019), 0.25 mg (05/09/2019), 0.25 mg (05/30/2019), 0.25 mg (06/27/2019), 0.25 mg (07/18/2019), 0.25 mg (08/08/2019), 0.25 mg (08/30/2019), 0.25 mg (09/20/2019), 0.25 mg (10/11/2019), 0.25 mg (11/08/2019), 0.25 mg (11/29/2019), 0.25 mg (12/21/2019), 0.25 mg (01/18/2020), 0.25 mg (02/08/2020), 0.25 mg (02/29/2020), 0.25 mg (03/22/2020), 0.25 mg (04/11/2020) pegfilgrastim-jmdb (FULPHILA) injection 6 mg, 6 mg, Subcutaneous,  Once, 4 of 6 cycles Administration: 6 mg (03/23/2020), 6 mg (04/12/2020) PEMEtrexed (ALIMTA) 900 mg in sodium chloride 0.9 % 100 mL chemo infusion, 500 mg/m2 = 900 mg, Intravenous,  Once, 21 of 23 cycles Dose modification: 400 mg/m2 (original dose 500 mg/m2, Cycle 14, Reason: Provider Judgment, Comment: change in reanl function, although CrCl > 45 ml/min) Administration: 900 mg (02/08/2019), 900 mg (03/29/2019), 900 mg (04/18/2019), 900 mg (05/09/2019), 900 mg (05/30/2019), 900 mg (02/28/2019), 900 mg (06/27/2019), 900 mg (07/18/2019), 900 mg (08/08/2019), 900 mg (08/30/2019),  900 mg (09/20/2019), 900 mg (10/11/2019), 900 mg (11/08/2019), 700 mg (11/29/2019), 700 mg (01/18/2020), 700 mg (02/08/2020), 700 mg (02/29/2020), 700 mg (12/21/2019), 700 mg (03/22/2020), 700 mg  (04/11/2020) CARBOplatin (PARAPLATIN) 500 mg in sodium chloride 0.9 % 250 mL chemo infusion, 470 mg (100 % of original dose 472 mg), Intravenous,  Once, 2 of 2 cycles Dose modification:   (original dose 472 mg, Cycle 1) Administration: 500 mg (02/08/2019), 190 mg (04/18/2019) pembrolizumab (KEYTRUDA) 200 mg in sodium chloride 0.9 % 50 mL chemo infusion, 200 mg, Intravenous, Once, 21 of 23 cycles Administration: 200 mg (02/08/2019), 200 mg (03/29/2019), 200 mg (04/18/2019), 200 mg (05/09/2019), 200 mg (05/30/2019), 200 mg (02/28/2019), 200 mg (06/27/2019), 200 mg (07/18/2019), 200 mg (08/08/2019), 200 mg (08/30/2019), 200 mg (09/20/2019), 200 mg (10/11/2019), 200 mg (11/08/2019), 200 mg (11/29/2019), 200 mg (12/21/2019), 200 mg (01/18/2020), 200 mg (02/08/2020), 200 mg (02/29/2020), 200 mg (03/22/2020), 200 mg (04/11/2020) fosaprepitant (EMEND) 150 mg, dexamethasone (DECADRON) 12 mg in sodium chloride 0.9 % 145 mL IVPB, , Intravenous,  Once, 4 of 4 cycles Administration:  (02/08/2019),  (04/18/2019)  for chemotherapy treatment.      ALLERGIES:  has No Known Allergies.  MEDICATIONS:  Current Outpatient Medications  Medication Sig Dispense Refill  . acetaminophen (TYLENOL) 500 MG tablet Take 500 mg by mouth 3 (three) times daily as needed.  (Patient not taking: Reported on 04/11/2020)    . albuterol (VENTOLIN HFA) 108 (90 Base) MCG/ACT inhaler Inhale 2 puffs into the lungs every 6 (six) hours as needed for wheezing or shortness of breath. (Patient not taking: Reported on 04/11/2020) 18 g 3  . ALPRAZolam (XANAX) 0.5 MG tablet Take 1 tablet (0.5 mg total) by mouth 4 (four) times daily as needed for anxiety. 60 tablet 0  . Calcium 600-400 MG-UNIT CHEW Chew 2 tablets by mouth daily.    Marland Kitchen dexamethasone (DECADRON) 4 MG tablet Take 1 tab two times a day the day before Alimta chemo, then take 2 tabs once a day for 3 days starting the day after chemo. 30 tablet 1  . ELIQUIS 5 MG TABS tablet TAKE 1 TABLET BY MOUTH TWICE DAILY 60 tablet  5  . FLUoxetine (PROZAC) 40 MG capsule Take 1 capsule (40 mg total) by mouth daily. 30 capsule 3  . folic acid (FOLVITE) 1 MG tablet Take 1 tablet (1 mg total) by mouth daily. Continue until 21 days after Alimta completed. 100 tablet 3  . ondansetron (ZOFRAN-ODT) 8 MG disintegrating tablet DISSOLVE 1 TABLET ON TONGUE EVERY 8 HOURS AS NEEDED FOR NAUSEA OR VOMITING 20 tablet 1  . Oxycodone HCl 10 MG TABS Take 1-2 tablets (10-20 mg total) by mouth every 4 (four) hours as needed. 90 tablet 0  . Oxycodone HCl 20 MG TABS TAKE 1/2 TO 1 TABLET BY MOUTH EVERY 4 HOURS AS NEEDED FOR SEVERE PAIN 60 tablet 0  . polyethylene glycol (MIRALAX / GLYCOLAX) packet Take 17 g by mouth daily.    Marland Kitchen senna (SENOKOT) 8.6 MG tablet Take 1 tablet by mouth as needed.      No current facility-administered medications for this visit.   Facility-Administered Medications Ordered in Other Visits  Medication Dose Route Frequency Provider Last Rate Last Admin  . 0.9 %  sodium chloride infusion   Intravenous Once Corcoran, Melissa C, MD      . 0.9 %  sodium chloride infusion   Intravenous Once PRN Mike Gip, Melissa C, MD      . albuterol (PROVENTIL) (2.5 MG/3ML)  0.083% nebulizer solution 2.5 mg  2.5 mg Nebulization Once PRN Corcoran, Melissa C, MD      . alteplase (CATHFLO ACTIVASE) injection 2 mg  2 mg Intracatheter Once PRN Lequita Asal, MD      . EPINEPHrine (ADRENALIN) 1 MG/10ML injection 0.25 mg  0.25 mg Intravenous Once PRN Corcoran, Melissa C, MD      . EPINEPHrine (ADRENALIN) 1 MG/10ML injection 0.25 mg  0.25 mg Intravenous Once PRN Corcoran, Melissa C, MD      . heparin lock flush 100 unit/mL  500 Units Intracatheter Once PRN Nolon Stalls C, MD      . heparin lock flush 100 unit/mL  250 Units Intracatheter Once PRN Nolon Stalls C, MD      . sodium chloride flush (NS) 0.9 % injection 10 mL  10 mL Intravenous PRN Lequita Asal, MD   10 mL at 03/04/19 0959  . sodium chloride flush (NS) 0.9 % injection  10 mL  10 mL Intracatheter Once PRN Corcoran, Melissa C, MD      . sodium chloride flush (NS) 0.9 % injection 3 mL  3 mL Intracatheter Once PRN Lequita Asal, MD        VITAL SIGNS: There were no vitals taken for this visit. There were no vitals filed for this visit.  Estimated body mass index is 20.18 kg/m as calculated from the following:   Height as of 05/25/2020: 5\' 6"  (1.676 m).   Weight as of 2020/05/25: 125 lb (56.7 kg).  LABS: CBC:    Component Value Date/Time   WBC 11.5 (H) 05-25-2020 1014   HGB 10.0 (L) 05-25-20 1014   HGB 14.9 04/06/2014 0826   HCT 31.8 (L) 2020/05/25 1014   HCT 44.8 04/06/2014 0826   PLT 287 05-25-20 1014   PLT 238 04/06/2014 0826   MCV 92.7 05-25-2020 1014   MCV 95 04/06/2014 0826   NEUTROABS 10.5 (H) 04/11/2020 0905   NEUTROABS 3.9 04/06/2014 0826   LYMPHSABS 0.5 (L) 04/11/2020 0905   LYMPHSABS 1.1 04/06/2014 0826   MONOABS 0.3 04/11/2020 0905   MONOABS 0.7 04/06/2014 0826   EOSABS 0.0 04/11/2020 0905   EOSABS 0.1 04/06/2014 0826   BASOSABS 0.0 04/11/2020 0905   BASOSABS 0.1 04/06/2014 0826   Comprehensive Metabolic Panel:    Component Value Date/Time   NA 137 04/11/2020 0905   NA 140 04/06/2014 0826   K 4.3 04/11/2020 0905   K 4.0 04/06/2014 0826   CL 104 04/11/2020 0905   CL 108 (H) 04/06/2014 0826   CO2 22 04/11/2020 0905   CO2 25 04/06/2014 0826   BUN 9 04/11/2020 0905   BUN 12 04/06/2014 0826   CREATININE 1.24 04/11/2020 0905   CREATININE 1.17 04/06/2014 0826   GLUCOSE 169 (H) 04/11/2020 0905   GLUCOSE 106 (H) 04/06/2014 0826   CALCIUM 8.6 (L) 04/11/2020 0905   CALCIUM 8.9 04/06/2014 0826   AST 25 04/11/2020 0905   AST 24 04/06/2014 0826   ALT 11 04/11/2020 0905   ALT 33 04/06/2014 0826   ALKPHOS 86 04/11/2020 0905   ALKPHOS 85 04/06/2014 0826   BILITOT 0.5 04/11/2020 0905   BILITOT 0.8 04/06/2014 0826   PROT 7.0 04/11/2020 0905   PROT 7.3 04/06/2014 0826   ALBUMIN 3.4 (L) 04/11/2020 0905   ALBUMIN 3.8  04/06/2014 0826    RADIOGRAPHIC STUDIES: CT Biopsy  Result Date: 2020-05-25 INDICATION: History of lung cancer, now with enlarging right lower lobe pulmonary nodule/mass. Please perform  CT-guided biopsy for acquisition of additional tissue for molecular testing. EXAM: CT GUIDED RIGHT LOWER LOBE PULMONARY NODULE BIOPSY COMPARISON:  Chest CT-03/21/2020; 12/15/2019; 09/16/2019; 05/30/2019 MEDICATIONS: None. ANESTHESIA/SEDATION: Fentanyl 50 mcg IV; Versed 1 mg IV Sedation time: 11 minutes; The patient was continuously monitored during the procedure by the interventional radiology nurse under my direct supervision. CONTRAST:  None COMPLICATIONS: None immediate. PROCEDURE: Informed consent was obtained from the patient following an explanation of the procedure, risks, benefits and alternatives. The patient understands,agrees and consents for the procedure. All questions were addressed. A time out was performed prior to the initiation of the procedure. The patient was positioned right lateral decubitus on the CT table and a limited chest CT was performed for procedural planning demonstrating unchanged size and appearance of the approximately 3.1 x 2.9 cm slightly spiculated mass within the superior segment of the right lower lobe (image 9, series 2). The operative site was prepped and draped in the usual sterile fashion. Under sterile conditions and local anesthesia, a 17 gauge coaxial needle was advanced into the peripheral aspect of the nodule. Positioning was confirmed with intermittent CT fluoroscopy and followed by the acquisition of 3 core needle biopsies with an 18 gauge core needle biopsy device. The coaxial needle was removed following deployment of a Biosentry plug and superficial hemostasis was achieved with manual compression. Limited post procedural chest CT was negative for pneumothorax or additional complication. A dressing was placed. The patient tolerated the procedure well without immediate  postprocedural complication. The patient was escorted to have an upright chest radiograph. IMPRESSION: Technically successful CT guided core needle core biopsy of enlarging right lower lobe pulmonary nodule/mass. Electronically Signed   By: Sandi Mariscal M.D.   On: 05/09/2020 13:20   DG Chest Port 1 View  Result Date: 05/09/2020 CLINICAL DATA:  Post right lower lobe pulmonary nodule biopsy. EXAM: PORTABLE CHEST 1 VIEW COMPARISON:  10/20/2019; CT-guided right lower lobe pulmonary nodule biopsy-earlier same day; chest CT-03/21/2020 FINDINGS: Unchanged cardiac silhouette and mediastinal contours. Stable position of support apparatus. Similar appearance of known nodule/mass overlying the superior segment of the right lower lobe. No pneumothorax. No new focal airspace opacities. There is chronic blunting of the bilateral costophrenic angles without definite pleural effusion. No evidence of edema. No acute osseous abnormalities. IMPRESSION: No evidence of complication following right lower lobe pulmonary nodule biopsy. Specifically, no pneumothorax. Electronically Signed   By: Sandi Mariscal M.D.   On: 05/09/2020 13:20    PERFORMANCE STATUS (ECOG) : 2  Review of Systems As noted above. Otherwise, a complete review of systems is negative.  Physical Exam General: NAD, frail appearing, thin Pulmonary: Unlabored Extremities: no edema Skin: no rashes Neurological: Weakness but otherwise nonfocal  IMPRESSION: Patient was scheduled today for MyChart visit but presented to the clinic instead.  He was seen in the consult room.  Patient reports doing about the same.  He has good days and bad.  He reports chronic fatigue and has less energy to engage in activities he finds enjoyable.  He reports worsened exertional dyspnea relates prolonged distances (such as grocery shopping).  I note that he recently had a CT guided biopsy of his enlarging right lower lobe pulmonary mass.  He continues to have persistent  generalized pain for which he takes oxycodone 20 mg about every 4 hours around-the-clock.  He reports oxycodone is effective in managing his pain.  He denies any other distressing symptoms at present.  Patient continues to endorse anxiety and depression.  I  have previously referred him several times to psychiatry but he continues to decline consultation.  We again discussed this today.  Also referral to home-based palliative care for support and patient declined.  Patient requested a handicap placard and the DMV application was completed today.  We also again reviewed the MOST form, which patient took on to consider decision-making.   PLAN: -Current scope of treatment -Continue oxycodone as needed for pain -Continue bowel regimen -Handicap placard completed -MOST form reviewed.  -Follow up MyChart visit in 1-2 months   Patient expressed understanding and was in agreement with this plan. He also understands that He can call clinic at any time with any questions, concerns, or complaints.   Time Total: 25 minutes  Visit consisted of counseling and education dealing with the complex and emotionally intense issues of symptom management and palliative care in the setting of serious and potentially life-threatening illness.Greater than 50%  of this time was spent counseling and coordinating care related to the above assessment and plan.  Signed by: [name], PhD, DNP, NP-C, [facility] [phone] (Work Cell)

## 2020-05-24 ENCOUNTER — Inpatient Hospital Stay: Payer: Medicare Other | Attending: Hematology and Oncology

## 2020-05-24 DIAGNOSIS — Z23 Encounter for immunization: Secondary | ICD-10-CM | POA: Diagnosis not present

## 2020-05-24 DIAGNOSIS — F419 Anxiety disorder, unspecified: Secondary | ICD-10-CM | POA: Insufficient documentation

## 2020-05-24 DIAGNOSIS — D6481 Anemia due to antineoplastic chemotherapy: Secondary | ICD-10-CM | POA: Diagnosis not present

## 2020-05-24 DIAGNOSIS — F32A Depression, unspecified: Secondary | ICD-10-CM | POA: Diagnosis not present

## 2020-05-24 DIAGNOSIS — C7951 Secondary malignant neoplasm of bone: Secondary | ICD-10-CM | POA: Diagnosis not present

## 2020-05-24 DIAGNOSIS — Z86711 Personal history of pulmonary embolism: Secondary | ICD-10-CM | POA: Insufficient documentation

## 2020-05-24 DIAGNOSIS — C3431 Malignant neoplasm of lower lobe, right bronchus or lung: Secondary | ICD-10-CM | POA: Diagnosis not present

## 2020-05-24 DIAGNOSIS — Z7901 Long term (current) use of anticoagulants: Secondary | ICD-10-CM | POA: Insufficient documentation

## 2020-05-24 DIAGNOSIS — Z79899 Other long term (current) drug therapy: Secondary | ICD-10-CM | POA: Diagnosis not present

## 2020-05-24 MED ORDER — CYANOCOBALAMIN 1000 MCG/ML IJ SOLN
1000.0000 ug | Freq: Once | INTRAMUSCULAR | Status: AC
  Administered 2020-05-24: 1000 ug via INTRAMUSCULAR
  Filled 2020-05-24: qty 1

## 2020-05-30 ENCOUNTER — Other Ambulatory Visit: Payer: Self-pay | Admitting: *Deleted

## 2020-05-30 DIAGNOSIS — G893 Neoplasm related pain (acute) (chronic): Secondary | ICD-10-CM

## 2020-05-30 MED ORDER — OXYCODONE HCL 20 MG PO TABS
ORAL_TABLET | ORAL | 0 refills | Status: DC
Start: 2020-05-30 — End: 2020-06-11

## 2020-06-05 DIAGNOSIS — C3431 Malignant neoplasm of lower lobe, right bronchus or lung: Secondary | ICD-10-CM | POA: Diagnosis not present

## 2020-06-06 ENCOUNTER — Telehealth: Payer: Self-pay | Admitting: *Deleted

## 2020-06-06 ENCOUNTER — Other Ambulatory Visit: Payer: Self-pay | Admitting: *Deleted

## 2020-06-06 DIAGNOSIS — F419 Anxiety disorder, unspecified: Secondary | ICD-10-CM

## 2020-06-06 MED ORDER — ALPRAZOLAM 0.5 MG PO TABS: 0.5000 mg | ORAL_TABLET | Freq: Four times a day (QID) | ORAL | 0 refills | Status: DC | PRN

## 2020-06-06 NOTE — Telephone Encounter (Signed)
Patient called asking if biopsy results are back yet and that he is getting anxious about this. I do not see that he has a follow up appointment with Dr C.  Please call patient.   Notes Component 4 wk ago  SURGICAL PATHOLOGY SURGICAL PATHOLOGY  CASE: [id number]  PATIENT: Jimmy West  Surgical Pathology Report      Specimen Submitted:  A. Right lower lobe lung mass biopsy   Clinical History: History of lung cancer, now with enlarging right lower  lobe pulmonary nodule, post CT guided bx.     DIAGNOSIS:  A. LUNG, RIGHT LOWER LOBE; CT-GUIDED CORE NEEDLE BIOPSY:  - POSITIVE FOR MALIGNANCY.  - NON-SMALL CELL CARCINOMA, IN A BACKGROUND OF SIGNIFICANT NECROSIS, SEE  COMMENT.   Comment:  Immunohistochemical studies show strong staining for CK7, both in viable  tumor cells and necrotic debris. There is patchy marking for p40 and  p63. TTF-1 and Napsin-A are negative.   The patient's history of pulmonary adenocarcinoma ([id number]) is  noted, and this case is reviewed in conjunction with the previous  material. The previous case showed positive staining for TTF-1 and  Napsin-A, and histologic evaluation showed tumor cells with  intracytoplasmic vacuoles, all features compatible with adenocarcinoma.  These histologic features are not seen in the current case, and the  tumor present in the current sample is negative for TTF-1 and Napsin-A.  The significance of these changes is unclear. This may represent  potential transformation to a carcinoma with squamous differentiation,  or the original biopsy could have represented the glandular component of  an adenosquamous carcinoma. Clinical correlation is recommended.   There is sufficient tissue present for ancillary testing in blocks A1  and A3, with limited tissue in block A2.   IHC slides were prepared by [facility] for Molecular Biology and  Pathology, RTP, [place]. All controls stained appropriately.   This test was  developed and its performance characteristics determined  by [facility]. It has not been cleared or approved by the [place] Food and Drug  Administration. The FDA does not require this test to go through  premarket FDA review. This test is used for clinical purposes. It should  not be regarded as investigational or for research. This laboratory is  certified under the Clinical Laboratory Improvement Amendments (CLIA) as  qualified to perform high complexity clinical laboratory testing.   GROSS DESCRIPTION:  A. Labeled: Right lower lobe lung mass  Received: In formalin  Tissue fragment(s): 3  Size: From 1.2 x 1.5 cm in length by 0.1 cm in diameter  Description: Pink-red to tan cylindrically shaped tissue fragments  Entirely submitted in 3 cassettes.    Final Diagnosis performed by Allena Napoleon, MD.  Electronically signed  05/16/2020 2:48:57PM  The electronic signature indicates that the named Attending Pathologist  has evaluated the specimen  Technical component performed at [facility], [address] [place],  [place] [address] Lab: [phone] Dir: Rush Farmer, MD, MMM  Professional component performed at [facility], [facility], [place], [place],  [address] Lab: [phone]  Dir: Dellia Nims. Reuel Derby, MD   Resulting Agency [facility]      Specimen Collected: 05/09/20 12:21 Last Resulted: 05/16/20 15:05

## 2020-06-06 NOTE — Telephone Encounter (Signed)
Pt left message needing refill of xanax. States only has 1 pill left. Last fill was on 11/1 for 15 day supply.

## 2020-06-07 NOTE — Telephone Encounter (Signed)
Transferred patient to scheduler to re-schedule apt to go over biopsy. Patient is unsure why he missed last apt. Patient verbalizes understanding.

## 2020-06-07 NOTE — Progress Notes (Signed)
[facility]  [address], [address] [place], [place] [address] Phone: [phone]  Fax: [phone]   Clinic Day:  06/11/20  Referring physician: [name], NP  Chief Complaint: Jimmy West is a 69 y.o. male with metastatic adenocarcinoma of the lung who is seen for assessment prior to cycle #19 Alimta and pembrolizumab.   HPI: The patient was last seen in the medical oncology clinic on 04/11/2020.  At that time, he felt "alright".  He wished to continue chemotherapy and pursue liquid biopsy testing.  Exam was stable. Hematocrit was 31.9, hemoglobin 9.8, MCV 93.0, platelets 453,000, WBC 11,500 (ANC 10,500). Calcium was 8.6 and albumin 3.4. Creatinine was 1.24 (CrCl 59 ml/min). TSH was 0.690. He received cycle #18 Alimta and pembrolizumab followed by Fulphila on 04/12/2020. He continued Eliquis.  He was contacted on 04/27/2020 regarding results from Foundation One Liquid biopsy.  Tumor mutational burden was 1 Muts/Mb.  MSI high was not detected.  CHEK2 G 69*,  KRAS G12D, TP53 G245S.  There were no actionable mutations.  We discussed obtaining new tumor tissue for Foundation One.  CT guided right lower lobe lung biopsy on 05/09/2020 revealed non-small cell carcinoma in a background of significant necrosis. Immunohistochemical studies showed strong staining for CK7, both in viable tumor cells and necrotic debris. There was patchy marking for p40 and p63. TTF-1 and Napsin-A were negative. This may represent  potential transformation to a carcinoma with squamous differentiation or the original biopsy could have represented the glandular component of an adenosquamous carcinoma.  The patient saw Billey Chang, NP on 05/23/2020. He endorsed chronic fatigue and low energy. He reported anxiety and depression but declined a psychiatry consult. He also declined home-based palliative care. His pain was managed with oxycodone.  He received a vitamin B12 injection  on 05/24/2020.  During the interim, he has been "ok".  He reports fatigue and occasional shortness of breath. His pain has been stable. He has not used a Fentanyl patch in a while. He denies cough. His appetite has improved.  He would like a flu shot today.   Past Medical History:  Diagnosis Date  . Bulging lumbar disc   . Cancer ([place])    stage 4 lung cancer  . Heart attack [facility])     Past Surgical History:  Procedure Laterality Date  . CARDIAC CATHETERIZATION     two stents  . KNEE SURGERY Left   . PORTA CATH INSERTION N/A 05/21/2018   Procedure: PORTA CATH INSERTION;  Surgeon: Algernon Huxley, MD;  Location: [facility];  Service: Cardiovascular;  Laterality: N/A;    Family History  Problem Relation Age of Onset  . Heart failure Father   . Cancer Maternal Aunt   . Heart failure Maternal Uncle   . Dementia Paternal Grandmother   . Cancer Maternal Aunt     Social History:  reports that he quit smoking about 16 years ago. His smoking use included cigarettes. He has a 22.50 pack-year smoking history. He has never used smokeless tobacco. He reports previous alcohol use. He reports that he does not use drugs. He started smoking at age 61. He is smoking 1 1/2 packs/day. Patient denies known exposures to radiation ortoxins. 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 He has not been working recently as the salon was closed. He plays golf occasionally (none recently).He has 8 cats. The patient is alone today.   Allergies: No Known Allergies  Current  Medications: Current Outpatient Medications  Medication Sig Dispense Refill  . ALPRAZolam (XANAX) 0.5 MG tablet Take 1 tablet (0.5 mg total) by mouth 4 (four) times daily as needed for anxiety. 60 tablet 0  . Calcium 600-400 MG-UNIT CHEW Chew 2 tablets by mouth daily.    Marland Kitchen dexamethasone (DECADRON) 4 MG tablet Take 1 tab two times a day the day before Alimta chemo, then take 2 tabs once a day for 3 days  starting the day after chemo. 30 tablet 1  . ELIQUIS 5 MG TABS tablet TAKE 1 TABLET BY MOUTH TWICE DAILY 60 tablet 5  . FLUoxetine (PROZAC) 40 MG capsule Take 1 capsule (40 mg total) by mouth daily. 30 capsule 3  . folic acid (FOLVITE) 1 MG tablet Take 1 tablet (1 mg total) by mouth daily. Continue until 21 days after Alimta completed. 100 tablet 3  . ondansetron (ZOFRAN-ODT) 8 MG disintegrating tablet DISSOLVE 1 TABLET ON TONGUE EVERY 8 HOURS AS NEEDED FOR NAUSEA OR VOMITING 20 tablet 1  . Oxycodone HCl 20 MG TABS TAKE 1/2 TO 1 TABLET BY MOUTH EVERY 4 HOURS AS NEEDED FOR SEVERE PAIN 60 tablet 0  . polyethylene glycol (MIRALAX / GLYCOLAX) packet Take 17 g by mouth daily.    Marland Kitchen senna (SENOKOT) 8.6 MG tablet Take 1 tablet by mouth as needed.     Marland Kitchen acetaminophen (TYLENOL) 500 MG tablet Take 500 mg by mouth 3 (three) times daily as needed.  (Patient not taking: Reported on 04/11/2020)    . albuterol (VENTOLIN HFA) 108 (90 Base) MCG/ACT inhaler Inhale 2 puffs into the lungs every 6 (six) hours as needed for wheezing or shortness of breath. (Patient not taking: Reported on 04/11/2020) 18 g 3   No current facility-administered medications for this visit.   Facility-Administered Medications Ordered in Other Visits  Medication Dose Route Frequency Provider Last Rate Last Admin  . 0.9 %  sodium chloride infusion   Intravenous Once Gearold Wainer C, MD      . 0.9 %  sodium chloride infusion   Intravenous Once PRN Zhion Pevehouse C, MD      . albuterol (PROVENTIL) (2.5 MG/3ML) 0.083% nebulizer solution 2.5 mg  2.5 mg Nebulization Once PRN Joram Venson C, MD      . alteplase (CATHFLO ACTIVASE) injection 2 mg  2 mg Intracatheter Once PRN Lequita Asal, MD      . EPINEPHrine (ADRENALIN) 1 MG/10ML injection 0.25 mg  0.25 mg Intravenous Once PRN Aleasha Fregeau C, MD      . EPINEPHrine (ADRENALIN) 1 MG/10ML injection 0.25 mg  0.25 mg Intravenous Once PRN Wilfrido Luedke C, MD      . heparin lock  flush 100 unit/mL  500 Units Intracatheter Once PRN Mike Gip, Gokul Waybright C, MD      . heparin lock flush 100 unit/mL  250 Units Intracatheter Once PRN Nolon Stalls C, MD      . sodium chloride flush (NS) 0.9 % injection 10 mL  10 mL Intravenous PRN Nolon Stalls C, MD   10 mL at 03/04/19 0959  . sodium chloride flush (NS) 0.9 % injection 10 mL  10 mL Intracatheter Once PRN Arrabella Westerman C, MD      . sodium chloride flush (NS) 0.9 % injection 3 mL  3 mL Intracatheter Once PRN Lequita Asal, MD        Review of Systems  Constitutional: Positive for malaise/fatigue. Negative for chills, diaphoresis, fever and weight loss (up 1 lb).  Feels "alright".  HENT: Negative.  Negative for congestion, ear discharge, ear pain, hearing loss, nosebleeds, sinus pain, sore throat and tinnitus.   Eyes: Negative.  Negative for blurred vision and double vision.  Respiratory: Positive for shortness of breath (occasional, on exertion). Negative for cough, hemoptysis and sputum production.   Cardiovascular: Positive for chest pain (when he takes deep breaths, occasional). Negative for palpitations, orthopnea and leg swelling.  Gastrointestinal: Negative for abdominal pain, blood in stool, constipation, diarrhea, heartburn, melena, nausea and vomiting.       Appetite has improved.  Genitourinary: Negative for dysuria, frequency and urgency.  Musculoskeletal: Positive for back pain (mild). Negative for falls, joint pain, myalgias and neck pain.  Skin: Negative.  Negative for itching and rash.  Neurological: Negative for dizziness, tingling, sensory change, focal weakness, weakness and headaches.  Endo/Heme/Allergies: Negative for environmental allergies and polydipsia. Does not bruise/bleed easily.  Psychiatric/Behavioral: Positive for depression. Negative for memory loss. The patient is nervous/anxious (on Xanax). The patient does not have insomnia (on OTC medication).   All other systems reviewed  and are negative.  Performance status (ECOG): 1  Vitals Blood pressure 126/79, pulse 85, temperature (!) 96.5 F (35.8 C), temperature source Tympanic, weight 127 lb 13.9 oz (58 kg), SpO2 98 %.   Physical Exam Vitals and nursing note reviewed.  Constitutional:      General: He is not in acute distress.    Appearance: He is well-developed. He is not diaphoretic.     Interventions: Face mask in place.  HENT:     Head: Normocephalic and atraumatic.     Comments: Thin brown graying hair. Mustache.    Right Ear: Hearing normal.     Mouth/Throat:     Mouth: Mucous membranes are moist. No oral lesions.     Pharynx: Oropharynx is clear.     Comments: Dentures. Eyes:     General: No scleral icterus.    Extraocular Movements: Extraocular movements intact.     Conjunctiva/sclera: Conjunctivae normal.     Pupils: Pupils are equal, round, and reactive to light.     Comments: Glasses.  Gray/blue eyes.  Neck:     Vascular: No JVD.  Cardiovascular:     Rate and Rhythm: Normal rate and regular rhythm.     Heart sounds: Normal heart sounds. No murmur heard.  No friction rub. No gallop.   Pulmonary:     Effort: Pulmonary effort is normal. No respiratory distress.     Breath sounds: No wheezing or rhonchi.  Chest:     Chest wall: No tenderness.  Abdominal:     General: Bowel sounds are normal. There is no distension.     Palpations: Abdomen is soft. There is no hepatomegaly, splenomegaly or mass.     Tenderness: There is no abdominal tenderness. There is no guarding or rebound.  Musculoskeletal:        General: No swelling or tenderness. Normal range of motion.     Cervical back: Normal range of motion and neck supple.  Lymphadenopathy:     Head:     Right side of head: No preauricular, posterior auricular or occipital adenopathy.     Left side of head: No preauricular, posterior auricular or occipital adenopathy.     Cervical: No cervical adenopathy.     Upper Body:     Right upper  body: No supraclavicular or axillary adenopathy.     Left upper body: No supraclavicular or axillary adenopathy.     Lower  Body: No right inguinal adenopathy. No left inguinal adenopathy.  Skin:    General: Skin is warm and dry.     Coloration: Skin is not pale.     Findings: No bruising, erythema, lesion or rash.  Neurological:     Mental Status: He is alert and oriented to person, place, and time. Mental status is at baseline.  Psychiatric:        Behavior: Behavior normal.        Thought Content: Thought content normal.        Judgment: Judgment normal.    Imaging studies: 04/30/2018:  PET scanrevealed a 3.4 cm hypermetabolic RLL pulmonary mass (SUV 13.2), 11 mm pulmonary nodule in the LEFT lung (SUV 4.5), RIGHT paratracheal lymph node (SUV 5.1), and hypermetabolic activity within the T4 vertebral body (SUV 10.2).  05/03/2018:  Thoracic spine MRIon 05/03/2018 revealed T4 metastasiswith a 40% pathologic compression deformity and 5 mm of retropulsion of the vertebral body. Retropulsion results in mild spinal canal stenosis and mild bilateral C4-5 foraminal stenosis. There was paravertebral soft tissue thickening from mid T3 to mid T5, likely representing edema related to the pathologic compression deformity vs. possible extraosseous extension of the neoplasm. There were no additional thoracic spinal metastases noted.  05/03/2018:  Head MRIrevealed no intracranial metastatic disease. 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 Incidental mention made of mild paranasal sinus disease. 01/10/2019:  Cervical and thoracic spine CT at [facility] unchanged severe compression deformity/vertebral plana of T4 with a stable degree of retropulsion and focal mild spinal canal stenosis. There was interval enlargement of a right  lower lobe pulmonary mass(4.5 x 3.5 cm compared to 3.9 x 2.9 cm)with redemonstrated spiculated pulmonary nodules. 01/28/2019:  Chest, abdomen, pelvisCTrevealed slight interval enlargement of a right lower lobe mass with central necrosis measuring 4.6 x 3.4 cm, previously 4.0 x 3.0 cm. There was no change in right upper lobe nodules(1.4 and 0.8 cm).Therewas almost no residua of left lung nodules, with irregular opacities in the apical left upper lobeandsuperior segment left lower lobe. There was no change in right hilar soft tissue and lymph nodes.There wasvertebra plana deformity of T4andno evidence of new osseous metastatic disease.There was no evidence of distant metastatic disease in the abdomen or pelvis.The1.6 x 1.1 cmleft adrenal nodule(non-metabolic on prior PET scan),was unchanged. 03/24/2019:  Renal ultrasoundrevealed no acute abnormality identified.There was no hydronephrosis or bladder distention. 04/23/2019:  Abdomen and pelvis CT revealed no acute abdominal or pelvic pathology. There was fluid in the colon which can be seen with diarrhea. There was diverticulosis without evidence of diverticulitis. There was a stable 1.5 cm left adrenal nodule. 05/06/2019:  Chest, abdomen, and pelvisCTrevealed a positive response to interval therapy for the known right lower lobe lung carcinoma(4.6 x 3.4 cm to3.1 x 2.8 cm transversely). There hadbeen a mild interval decrease contiguous soft tissue that extends along the right hilum. The two right upper lobe noduleswerestable from the most recent prior study (smaller than 04/27/2018).There are no new lung nodules.Therewasno evidence of new metastatic disease.There was stable severe compression deformity/vertebra plana of T4.Therewasno evidence of other osseous metastatic disease.There was a stable left adrenal nodule consistent with an adenoma. 05/30/2019:  ChestCT angiogramrevealed a tiny subsegmental pulmonary  embolusin the right lower lobe. There areas of ground-glass at the periphery of the right chest hadbecome more conspicuous. This mayrelate to mild pneumonitis,  perhaps due to radiation, attention on follow-up.The dominant nodule in the right lung basewasstable in size. Heis onEliquis. 08/14/2019:  Cervical spine MRIrevealed no evidence of metastatic disease within the cervical spine. 09/16/2019:  Chest, abdomen and pelvis CTrevealed asimilar-appearing spiculated right lower lobe nodule.There was no evidence for metastatic disease in the abdomen or pelvis.There was stable left adrenal adenoma. 12/15/2019:  Chest CT revealed an enlarging right lower lobe mass c/w malignancy (3.0 x 2.4 cm to 3.3 x 2.7 cm).  There was slight enlargement in a spiculated right upper lobe subpleural nodule (0.8 x 1.5 cm to 1.2 x 0.7 cm). 01/16/2020:  Bone scan revealed no scintigraphic evidence of osseous metastatic disease. 03/21/2020:  Chest CT angiogram revealed no evidence of pulmonary embolism.  A rounded spiculated mass in the RUL measuring 3.4 x 3 cm which was slightly enlarged with adjacent pleural thickening.  There was a stable 1.4 x 0.6 cm irregular subpleural density in the RUL.  There was stable severe compression deformity in the upper thoracic vertebral body consistent with an old fracture.   No visits with results within 3 Day(s) from this visit.  Latest known visit with results is:  Hospital Outpatient Visit on 05/09/2020  Component Date Value Ref Range Status  . WBC 05/09/2020 11.5* 4.0 - 10.5 K/uL Final  . RBC 05/09/2020 3.43* 4.22 - 5.81 MIL/uL Final  . Hemoglobin 05/09/2020 10.0* 13.0 - 17.0 g/dL Final  . HCT 05/09/2020 31.8* 39 - 52 % Final  . MCV 05/09/2020 92.7  80.0 - 100.0 fL Final  . MCH 05/09/2020 29.2  26.0 - 34.0 pg Final  . MCHC 05/09/2020 31.4  30.0 - 36.0 g/dL Final  . RDW 05/09/2020 17.4* 11.5 - 15.5 % Final  . Platelets 05/09/2020 287  150 - 400 K/uL Final  . nRBC  05/09/2020 0.0  0.0 - 0.2 % Final   Performed at [facility], [address] [place], [place] [address]  . Prothrombin Time 05/09/2020 12.7  11.4 - 15.2 seconds Final  . INR 05/09/2020 1.0  0.8 - 1.2 Final   Comment: (NOTE) INR goal varies based on device and disease states. Performed at [facility], [address], [place], [place] [address]   . SURGICAL PATHOLOGY 05/09/2020    Final-Edited                   Value:SURGICAL PATHOLOGY CASE: [id number] PATIENT: Shirley Muscat Surgical Pathology Report  Specimen Submitted: A. Right lower lobe lung mass biopsy  Clinical History: History of lung cancer, now with enlarging right lower lobe pulmonary nodule, post CT guided bx.  DIAGNOSIS: A. LUNG, RIGHT LOWER LOBE; CT-GUIDED CORE NEEDLE BIOPSY: - POSITIVE FOR MALIGNANCY. - NON-SMALL CELL CARCINOMA, IN A BACKGROUND OF SIGNIFICANT NECROSIS, SEE COMMENT.  Comment: Immunohistochemical studies show strong staining for CK7, both in viable tumor cells and necrotic debris. There is patchy marking for p40 and p63. TTF-1 and Napsin-A are negative.  The patient's history of pulmonary adenocarcinoma ([id number]) is noted, and this case is reviewed in conjunction with the previous material. The previous case showed positive staining for TTF-1 and Napsin-A, and histologic evaluation showed tumor cells with intracytoplasmic vacuoles, all features compatible with adenocarcinoma.                          These histologic features are not seen in the current case, and the tumor present in the current sample is negative for TTF-1 and Napsin-A. The  significance of these changes is unclear. This may represent potential transformation to a carcinoma with squamous differentiation, or the original biopsy could have represented the glandular component of an adenosquamous carcinoma. Clinical correlation is recommended.  There is sufficient tissue present for ancillary testing  in blocks A1 and A3, with limited tissue in block A2.  IHC slides were prepared by [facility], [place], [place]. All controls stained appropriately.  This test was developed and its performance characteristics determined by [facility]. It has not been cleared or approved by the [place] Food and Drug Administration. The FDA does not require this test to go through premarket FDA review. This test is used for clinical purposes. It should not be regarded as investigational or for resea                         rch. This laboratory is certified under the Clinical Laboratory Improvement Amendments (CLIA) as qualified to perform high complexity clinical laboratory testing.  GROSS DESCRIPTION: A. Labeled: Right lower lobe lung mass Received: In formalin Tissue fragment(s): 3 Size: From 1.2 x 1.5 cm in length by 0.1 cm in diameter Description: Pink-red to tan cylindrically shaped tissue fragments Entirely submitted in 3 cassettes.  Final Diagnosis performed by Allena Napoleon, MD.   Electronically signed 05/16/2020 2:48:57PM The electronic signature indicates that the named Attending Pathologist has evaluated the specimen Technical component performed at [facility], [address] [place], [place] [address] Lab: [phone] Dir: Rush Farmer, MD, MMM  Professional component performed at [facility], [facility], [place], [place], [place] [address] Lab: [phone] Dir: Dellia Nims. Reuel Derby, MD    Assessment:  Jimmy West is a 69 y.o. male with metastatichigh-grade adenocarcinomaof the right lungs/p CT-guided biopsy of a RLL lung mass on 05/11/2018. Pathologyrevealed an invasive high-grade adenocarcinoma, with predominantly solid growth pattern. The neoplastic cells were TTF-1 (+), Napsin A (+), and P40 (+). He has a T4 vertebral metastasis. Clinical stage is T4N1M1.  There was not enough material for Foundation One testing.  PDL-1revealed TPS 90%.  PET scanon 04/30/2018 revealed a 3.4 cm hypermetabolic RLL pulmonary mass (SUV 13.2), 11 mm pulmonary nodule in the LEFT lung (SUV 4.5), RIGHT paratracheal lymph node (SUV 5.1), and hypermetabolic activity within the T4 vertebral body (SUV 10.2).   Hereceived 11cycles ofpembrolizumab(05/24/2018 - 02/07/2019). He toleratedtreatment well. CEAwas 1.3 on 08/30/2018.LDHwas 165 on 11/29/2018.  He completed T4 radiationon 07/07/2018. He receives Xgevamonthly (06/03/2018 -03/12/2020).  He received 1 cycle of carboplatin, Alimta, and pembrolizumabon 02/08/2019. Cycle #1 was complicated by nausea, vomiting, and diarrhea necessitating hospitalization.Decision made to hold carboplatin with his second dose.He iss/p2cycles ofAlimta and pembrolizumab (02/28/2019 - 03/29/2019).Cycle #2 was complicated by nausea, vomiting, and dehydration requring fluids in clinic and an overnight stay in the hospital. Hereceivedcarboplatin (AUC 2), Alimta, and pebrolizumab(04/18/2019).He tolerated it poorly.   Hehasreceived18 cycles ofAlimta and pembrolizumabon (05/09/2019 -04/11/2020). He began Fulphila with cycle #14 secondary to neutropenia.  He has tolerated chemotherapy well. He receives B12every 9 weeks (last11/10/2019).  Chest CT angiogram on 03/21/2020 revealed no evidence of pulmonary embolism.  A rounded spiculated 3.4 x 3 cm mass in the RUL was slightly enlarged with adjacent pleural thickening.  There was a stable 1.4 x 0.6 cm irregular subpleural density in the RUL.  There was stable severe compression deformity in the upper thoracic vertebral body consistent with an old fracture.  CT guided right lower lobe lung biopsy on 05/09/2020 revealed non-small  cell carcinoma in a background of significant necrosis. Immunohistochemical studies showed strong staining for CK7, both in viable tumor cells and necrotic debris. There was patchy marking for p40  and p63. TTF-1 and Napsin-A were negative.  This may represent potential transformation to a carcinoma with squamous differentiation or the original biopsy could have represented the glandular component of an adenosquamous carcinoma.  He hascancer-related painin T4. He is off the [employer].  He is taking oxycodone10-80m every 4 hours prn.  He has cervicalgia.Cervical spine MRIon 08/14/2019 showed no evidence of metastatic disease within the cervical spine. Heis followedby Dr YIzora Ribas  He has chemotherapy induced anemia.  He received Retacrit on 11/01/2019 (last 04/09/2020).  Hehad transient renal insufficiencyon 03/23/2019. Creatinine was 1.32 (baseline 0.61-1.0). Renal ultrasoundon 03/24/2019 revealed no acute abnormality identified.There was no hydronephrosis or bladder distention.Creatinine is 0.82 today.  Stool waspositive for C difficile + diarrheaon 06/14/2019.Hewas treated with oral vancomycin.  He received the influenza vaccineon 05/16/2019.   Symptomatically, he feels "ok".  He reports fatigue and occasional shortness of breath. His pain has been stable.  Plan: 1.   Labs: CBC with diff, CMP, Mg, TSH. 2. Metastatic high-grade adenocarcinoma the RIGHT lung He is s/p 11 cycles of pembrolizumab. He is s/p 1 cycle of carboplatin, Almita, and pembrolizumab (02/08/2019). He is s/p 1 cycle of carboplatin (AUC 2), Alimta, and pembrolizumab (04/18/2019). He is s/p18cycles of Alimta and pembrolizumab (02/28/2019 - 03/29/2019; 05/09/2019-04/11/2020). Chest CT angiogram on 09/01/2021revealed slight growth in the RUL mass.  Review results of CT guided biopsy.   Apparent transformation to squamous differentiation.  Discuss awaiting Foundation One to determine any potential driver mutations.     Review prior chemotherapy regimens.   He was intolerant to carboplatin at an AUC of 5 as well as 2.  Discuss consideration of Taxotere if no  mutations.   Potential side effects of Taxotere reviewed.   Information on Taxotere provided.   Contact patient with results of Foundation One.  Discuss symptom management.  He has antiemetics and pain medications at home to use on a prn bases.  Interventions are adequate.    3. Bone metastasis Symptomatically, pain is well controlled.  Bone scan on 01/16/2020 revealed no evidence of active metastatic disease.  He last received Xgeva on 03/12/2020.  Anticipate follow-up bone scan in 06/2020. 4.Anxiety and depression Clinically, he continues to have anxiety and depression.   He is on low-dose Xanax as well as Prozac.  He is currently not interested in psychiatry follow-up. 5.Pulmonary embolism Clinically, he is doing well.  Continue Eliquis. 6.Influenza vaccine. 7.   No chemotherapy today. 8.   Await results of Foundation One on lung biopsy on 05/09/2020. 9.   Preauth Taxotere. 10.   RTC in 2 weeks for MD assessment, labs (CBC with diff, CMP, Mg) and +/- cycle #1 Taxotere.  Addendum:  Foundation One on 05/09/2020 revealed no reportable alterations.  Microsatellite status was stable, tumor mutational burden 5 Muts/MB, CDKN2A loss, CDKN2B loss, KRAS G12D, MTAP loss, NF2 E442*, STK11 P203 fs*84 and TP53 Y205S.  There were no reportable alterations in ALK, BRAF, EGFR, ERBB2, MET, RET, and ROS1.  I discussed the assessment and treatment plan with the patient.  The patient was provided an opportunity to ask questions and all were answered.  The patient agreed with the plan and demonstrated an understanding of the instructions.  The patient was advised to call back if the symptoms worsen or if the condition fails to improve as anticipated.  I  provided 18 minutes of face-to-face time during this this encounter and > 50% was spent counseling as documented under my assessment and plan.  An additional 10 minutes were spent reviewing his chart (Epic and  Care Everywhere) including notes, labs, and imaging studies and writing chemotherapy.    Lequita Asal, MD, PhD 06/11/2020, 5:00 PM  I, Mirian Mo Tufford, am acting as a Education administrator for [employer]. Mike Gip, MD.   I, Cal Gindlesperger C. Mike Gip, MD, have reviewed the above documentation for accuracy and completeness, and I agree with the above.

## 2020-06-11 ENCOUNTER — Inpatient Hospital Stay (HOSPITAL_BASED_OUTPATIENT_CLINIC_OR_DEPARTMENT_OTHER): Payer: Medicare Other | Admitting: Hematology and Oncology

## 2020-06-11 ENCOUNTER — Inpatient Hospital Stay: Payer: Medicare Other

## 2020-06-11 ENCOUNTER — Encounter: Payer: Self-pay | Admitting: Hematology and Oncology

## 2020-06-11 ENCOUNTER — Other Ambulatory Visit: Payer: Self-pay | Admitting: *Deleted

## 2020-06-11 ENCOUNTER — Other Ambulatory Visit: Payer: Self-pay

## 2020-06-11 VITALS — BP 126/79 | HR 85 | Temp 96.5°F | Wt 127.9 lb

## 2020-06-11 DIAGNOSIS — Z7189 Other specified counseling: Secondary | ICD-10-CM

## 2020-06-11 DIAGNOSIS — Z7901 Long term (current) use of anticoagulants: Secondary | ICD-10-CM

## 2020-06-11 DIAGNOSIS — G893 Neoplasm related pain (acute) (chronic): Secondary | ICD-10-CM

## 2020-06-11 DIAGNOSIS — C7951 Secondary malignant neoplasm of bone: Secondary | ICD-10-CM | POA: Diagnosis not present

## 2020-06-11 DIAGNOSIS — Z23 Encounter for immunization: Secondary | ICD-10-CM | POA: Diagnosis not present

## 2020-06-11 DIAGNOSIS — F419 Anxiety disorder, unspecified: Secondary | ICD-10-CM

## 2020-06-11 DIAGNOSIS — F32A Depression, unspecified: Secondary | ICD-10-CM | POA: Diagnosis not present

## 2020-06-11 DIAGNOSIS — C3431 Malignant neoplasm of lower lobe, right bronchus or lung: Secondary | ICD-10-CM

## 2020-06-11 DIAGNOSIS — F329 Major depressive disorder, single episode, unspecified: Secondary | ICD-10-CM

## 2020-06-11 DIAGNOSIS — T451X5A Adverse effect of antineoplastic and immunosuppressive drugs, initial encounter: Secondary | ICD-10-CM | POA: Diagnosis not present

## 2020-06-11 DIAGNOSIS — D6481 Anemia due to antineoplastic chemotherapy: Secondary | ICD-10-CM

## 2020-06-11 MED ORDER — INFLUENZA VAC A&B SA ADJ QUAD 0.5 ML IM PRSY
0.5000 mL | PREFILLED_SYRINGE | Freq: Once | INTRAMUSCULAR | Status: AC
  Administered 2020-06-11: 0.5 mL via INTRAMUSCULAR

## 2020-06-11 MED ORDER — OXYCODONE HCL 20 MG PO TABS: ORAL_TABLET | ORAL | 0 refills | Status: DC

## 2020-06-11 NOTE — Telephone Encounter (Signed)
Requests refill of oxycodone. States will be out on Wednesday this week.

## 2020-06-11 NOTE — Progress Notes (Signed)
Patient here for oncology follow-up appointment, expresses no complaints or concerns at this time.    

## 2020-06-11 NOTE — Patient Instructions (Signed)
Docetaxel injection What is this medicine? DOCETAXEL (doe se TAX el) is a chemotherapy drug. It targets fast dividing cells, like cancer cells, and causes these cells to die. This medicine is used to treat many types of cancers like breast cancer, certain stomach cancers, head and neck cancer, lung cancer, and prostate cancer. This medicine may be used for other purposes; ask your health care provider or pharmacist if you have questions. COMMON BRAND NAME(S): Docefrez, Taxotere What should I tell my health care provider before I take this medicine? They need to know if you have any of these conditions:  infection (especially a virus infection such as chickenpox, cold sores, or herpes)  liver disease  low blood counts, like low white cell, platelet, or red cell counts  an unusual or allergic reaction to docetaxel, polysorbate 80, other chemotherapy agents, other medicines, foods, dyes, or preservatives  pregnant or trying to get pregnant  breast-feeding How should I use this medicine? This drug is given as an infusion into a vein. It is administered in a hospital or clinic by a specially trained health care professional. Talk to your pediatrician regarding the use of this medicine in children. Special care may be needed. Overdosage: If you think you have taken too much of this medicine contact a poison control center or emergency room at once. NOTE: This medicine is only for you. Do not share this medicine with others. What if I miss a dose? It is important not to miss your dose. Call your doctor or health care professional if you are unable to keep an appointment. What may interact with this medicine?  aprepitant  certain antibiotics like erythromycin or clarithromycin  certain antivirals for HIV or hepatitis  certain medicines for fungal infections like fluconazole, itraconazole, ketoconazole, posaconazole, or  voriconazole  cimetidine  ciprofloxacin  conivaptan  cyclosporine  dronedarone  fluvoxamine  grapefruit juice  imatinib  verapamil This list may not describe all possible interactions. Give your health care provider a list of all the medicines, herbs, non-prescription drugs, or dietary supplements you use. Also tell them if you smoke, drink alcohol, or use illegal drugs. Some items may interact with your medicine. What should I watch for while using this medicine? Your condition will be monitored carefully while you are receiving this medicine. You will need important blood work done while you are taking this medicine. Call your doctor or health care professional for advice if you get a fever, chills or sore throat, or other symptoms of a cold or flu. Do not treat yourself. This drug decreases your body's ability to fight infections. Try to avoid being around people who are sick. Some products may contain alcohol. Ask your health care professional if this medicine contains alcohol. Be sure to tell all health care professionals you are taking this medicine. Certain medicines, like metronidazole and disulfiram, can cause an unpleasant reaction when taken with alcohol. The reaction includes flushing, headache, nausea, vomiting, sweating, and increased thirst. The reaction can last from 30 minutes to several hours. You may get drowsy or dizzy. Do not drive, use machinery, or do anything that needs mental alertness until you know how this medicine affects you. Do not stand or sit up quickly, especially if you are an older patient. This reduces the risk of dizzy or fainting spells. Alcohol may interfere with the effect of this medicine. Talk to your health care professional about your risk of cancer. You may be more at risk for certain types of cancer  if you take this medicine. Do not become pregnant while taking this medicine or for 6 months after stopping it. Women should inform their doctor if  they wish to become pregnant or think they might be pregnant. There is a potential for serious side effects to an unborn child. Talk to your health care professional or pharmacist for more information. Do not breast-feed an infant while taking this medicine or for 1 week after stopping it. Males who get this medicine must use a condom during sex with females who can get pregnant. If you get a woman pregnant, the baby could have birth defects. The baby could die before they are born. You will need to continue wearing a condom for 3 months after stopping the medicine. Tell your health care provider right away if your partner becomes pregnant while you are taking this medicine. This may interfere with the ability to father a child. You should talk to your doctor or health care professional if you are concerned about your fertility. What side effects may I notice from receiving this medicine? Side effects that you should report to your doctor or health care professional as soon as possible:  allergic reactions like skin rash, itching or hives, swelling of the face, lips, or tongue  blurred vision  breathing problems  changes in vision  low blood counts - This drug may decrease the number of white blood cells, red blood cells and platelets. You may be at increased risk for infections and bleeding.  nausea and vomiting  pain, redness or irritation at site where injected  pain, tingling, numbness in the hands or feet  redness, blistering, peeling, or loosening of the skin, including inside the mouth  signs of decreased platelets or bleeding - bruising, pinpoint red spots on the skin, black, tarry stools, nosebleeds  signs of decreased red blood cells - unusually weak or tired, fainting spells, lightheadedness  signs of infection - fever or chills, cough, sore throat, pain or difficulty passing urine  swelling of the ankle, feet, hands Side effects that usually do not require medical attention  (report to your doctor or health care professional if they continue or are bothersome):  constipation  diarrhea  fingernail or toenail changes  hair loss  loss of appetite  mouth sores  muscle pain This list may not describe all possible side effects. Call your doctor for medical advice about side effects. You may report side effects to FDA at 1-800-FDA-1088. Where should I keep my medicine? This drug is given in a hospital or clinic and will not be stored at home. NOTE: This sheet is a summary. It may not cover all possible information. If you have questions about this medicine, talk to your doctor, pharmacist, or health care provider.  2020 Elsevier/Gold Standard (2019-03-03 10:19:06)    Paclitaxel injection What is this medicine? PACLITAXEL (PAK li TAX el) is a chemotherapy drug. It targets fast dividing cells, like cancer cells, and causes these cells to die. This medicine is used to treat ovarian cancer, breast cancer, lung cancer, Kaposi's sarcoma, and other cancers. This medicine may be used for other purposes; ask your health care provider or pharmacist if you have questions. COMMON BRAND NAME(S): Onxol, Taxol What should I tell my health care provider before I take this medicine? They need to know if you have any of these conditions:  history of irregular heartbeat  liver disease  low blood counts, like low white cell, platelet, or red cell counts  lung or  breathing disease, like asthma  tingling of the fingers or toes, or other nerve disorder  an unusual or allergic reaction to paclitaxel, alcohol, polyoxyethylated castor oil, other chemotherapy, other medicines, foods, dyes, or preservatives  pregnant or trying to get pregnant  breast-feeding How should I use this medicine? This drug is given as an infusion into a vein. It is administered in a hospital or clinic by a specially trained health care professional. Talk to your pediatrician regarding the use of  this medicine in children. Special care may be needed. Overdosage: If you think you have taken too much of this medicine contact a poison control center or emergency room at once. NOTE: This medicine is only for you. Do not share this medicine with others. What if I miss a dose? It is important not to miss your dose. Call your doctor or health care professional if you are unable to keep an appointment. What may interact with this medicine? Do not take this medicine with any of the following medications:  disulfiram  metronidazole This medicine may also interact with the following medications:  antiviral medicines for hepatitis, HIV or AIDS  certain antibiotics like erythromycin and clarithromycin  certain medicines for fungal infections like ketoconazole and itraconazole  certain medicines for seizures like carbamazepine, phenobarbital, phenytoin  gemfibrozil  nefazodone  rifampin  St. John's wort This list may not describe all possible interactions. Give your health care provider a list of all the medicines, herbs, non-prescription drugs, or dietary supplements you use. Also tell them if you smoke, drink alcohol, or use illegal drugs. Some items may interact with your medicine. What should I watch for while using this medicine? Your condition will be monitored carefully while you are receiving this medicine. You will need important blood work done while you are taking this medicine. This medicine can cause serious allergic reactions. To reduce your risk you will need to take other medicine(s) before treatment with this medicine. If you experience allergic reactions like skin rash, itching or hives, swelling of the face, lips, or tongue, tell your doctor or health care professional right away. In some cases, you may be given additional medicines to help with side effects. Follow all directions for their use. This drug may make you feel generally unwell. This is not uncommon, as  chemotherapy can affect healthy cells as well as cancer cells. Report any side effects. Continue your course of treatment even though you feel ill unless your doctor tells you to stop. Call your doctor or health care professional for advice if you get a fever, chills or sore throat, or other symptoms of a cold or flu. Do not treat yourself. This drug decreases your body's ability to fight infections. Try to avoid being around people who are sick. This medicine may increase your risk to bruise or bleed. Call your doctor or health care professional if you notice any unusual bleeding. Be careful brushing and flossing your teeth or using a toothpick because you may get an infection or bleed more easily. If you have any dental work done, tell your dentist you are receiving this medicine. Avoid taking products that contain aspirin, acetaminophen, ibuprofen, naproxen, or ketoprofen unless instructed by your doctor. These medicines may hide a fever. Do not become pregnant while taking this medicine. Women should inform their doctor if they wish to become pregnant or think they might be pregnant. There is a potential for serious side effects to an unborn child. Talk to your health care professional or  pharmacist for more information. Do not breast-feed an infant while taking this medicine. Men are advised not to father a child while receiving this medicine. This product may contain alcohol. Ask your pharmacist or healthcare provider if this medicine contains alcohol. Be sure to tell all healthcare providers you are taking this medicine. Certain medicines, like metronidazole and disulfiram, can cause an unpleasant reaction when taken with alcohol. The reaction includes flushing, headache, nausea, vomiting, sweating, and increased thirst. The reaction can last from 30 minutes to several hours. What side effects may I notice from receiving this medicine? Side effects that you should report to your doctor or health care  professional as soon as possible:  allergic reactions like skin rash, itching or hives, swelling of the face, lips, or tongue  breathing problems  changes in vision  fast, irregular heartbeat  high or low blood pressure  mouth sores  pain, tingling, numbness in the hands or feet  signs of decreased platelets or bleeding - bruising, pinpoint red spots on the skin, black, tarry stools, blood in the urine  signs of decreased red blood cells - unusually weak or tired, feeling faint or lightheaded, falls  signs of infection - fever or chills, cough, sore throat, pain or difficulty passing urine  signs and symptoms of liver injury like dark yellow or brown urine; general ill feeling or flu-like symptoms; light-colored stools; loss of appetite; nausea; right upper belly pain; unusually weak or tired; yellowing of the eyes or skin  swelling of the ankles, feet, hands  unusually slow heartbeat Side effects that usually do not require medical attention (report to your doctor or health care professional if they continue or are bothersome):  diarrhea  hair loss  loss of appetite  muscle or joint pain  nausea, vomiting  pain, redness, or irritation at site where injected  tiredness This list may not describe all possible side effects. Call your doctor for medical advice about side effects. You may report side effects to FDA at 1-800-FDA-1088. Where should I keep my medicine? This drug is given in a hospital or clinic and will not be stored at home. NOTE: This sheet is a summary. It may not cover all possible information. If you have questions about this medicine, talk to your doctor, pharmacist, or health care provider.  2020 Elsevier/Gold Standard (2017-03-10 13:14:55)   Carboplatin injection What is this medicine? CARBOPLATIN (KAR boe pla tin) is a chemotherapy drug. It targets fast dividing cells, like cancer cells, and causes these cells to die. This medicine is used to  treat ovarian cancer and many other cancers. This medicine may be used for other purposes; ask your health care provider or pharmacist if you have questions. COMMON BRAND NAME(S): Paraplatin What should I tell my health care provider before I take this medicine? They need to know if you have any of these conditions:  blood disorders  hearing problems  kidney disease  recent or ongoing radiation therapy  an unusual or allergic reaction to carboplatin, cisplatin, other chemotherapy, other medicines, foods, dyes, or preservatives  pregnant or trying to get pregnant  breast-feeding How should I use this medicine? This drug is usually given as an infusion into a vein. It is administered in a hospital or clinic by a specially trained health care professional. Talk to your pediatrician regarding the use of this medicine in children. Special care may be needed. Overdosage: If you think you have taken too much of this medicine contact a poison control center  or emergency room at once. NOTE: This medicine is only for you. Do not share this medicine with others. What if I miss a dose? It is important not to miss a dose. Call your doctor or health care professional if you are unable to keep an appointment. What may interact with this medicine?  medicines for seizures  medicines to increase blood counts like filgrastim, pegfilgrastim, sargramostim  some antibiotics like amikacin, gentamicin, neomycin, streptomycin, tobramycin  vaccines Talk to your doctor or health care professional before taking any of these medicines:  acetaminophen  aspirin  ibuprofen  ketoprofen  naproxen This list may not describe all possible interactions. Give your health care provider a list of all the medicines, herbs, non-prescription drugs, or dietary supplements you use. Also tell them if you smoke, drink alcohol, or use illegal drugs. Some items may interact with your medicine. What should I watch for  while using this medicine? Your condition will be monitored carefully while you are receiving this medicine. You will need important blood work done while you are taking this medicine. This drug may make you feel generally unwell. This is not uncommon, as chemotherapy can affect healthy cells as well as cancer cells. Report any side effects. Continue your course of treatment even though you feel ill unless your doctor tells you to stop. In some cases, you may be given additional medicines to help with side effects. Follow all directions for their use. Call your doctor or health care professional for advice if you get a fever, chills or sore throat, or other symptoms of a cold or flu. Do not treat yourself. This drug decreases your body's ability to fight infections. Try to avoid being around people who are sick. This medicine may increase your risk to bruise or bleed. Call your doctor or health care professional if you notice any unusual bleeding. Be careful brushing and flossing your teeth or using a toothpick because you may get an infection or bleed more easily. If you have any dental work done, tell your dentist you are receiving this medicine. Avoid taking products that contain aspirin, acetaminophen, ibuprofen, naproxen, or ketoprofen unless instructed by your doctor. These medicines may hide a fever. Do not become pregnant while taking this medicine. Women should inform their doctor if they wish to become pregnant or think they might be pregnant. There is a potential for serious side effects to an unborn child. Talk to your health care professional or pharmacist for more information. Do not breast-feed an infant while taking this medicine. What side effects may I notice from receiving this medicine? Side effects that you should report to your doctor or health care professional as soon as possible:  allergic reactions like skin rash, itching or hives, swelling of the face, lips, or tongue  signs  of infection - fever or chills, cough, sore throat, pain or difficulty passing urine  signs of decreased platelets or bleeding - bruising, pinpoint red spots on the skin, black, tarry stools, nosebleeds  signs of decreased red blood cells - unusually weak or tired, fainting spells, lightheadedness  breathing problems  changes in hearing  changes in vision  chest pain  high blood pressure  low blood counts - This drug may decrease the number of white blood cells, red blood cells and platelets. You may be at increased risk for infections and bleeding.  nausea and vomiting  pain, swelling, redness or irritation at the injection site  pain, tingling, numbness in the hands or feet  problems with balance, talking, walking  trouble passing urine or change in the amount of urine Side effects that usually do not require medical attention (report to your doctor or health care professional if they continue or are bothersome):  hair loss  loss of appetite  metallic taste in the mouth or changes in taste This list may not describe all possible side effects. Call your doctor for medical advice about side effects. You may report side effects to FDA at 1-800-FDA-1088. Where should I keep my medicine? This drug is given in a hospital or clinic and will not be stored at home. NOTE: This sheet is a summary. It may not cover all possible information. If you have questions about this medicine, talk to your doctor, pharmacist, or health care provider.  2020 Elsevier/Gold Standard (2007-10-12 14:38:05)   Gemcitabine injection What is this medicine? GEMCITABINE (jem SYE ta been) is a chemotherapy drug. This medicine is used to treat many types of cancer like breast cancer, lung cancer, pancreatic cancer, and ovarian cancer. This medicine may be used for other purposes; ask your health care provider or pharmacist if you have questions. COMMON BRAND NAME(S): Gemzar, Infugem What should I tell my  health care provider before I take this medicine? They need to know if you have any of these conditions:  blood disorders  infection  kidney disease  liver disease  lung or breathing disease, like asthma  recent or ongoing radiation therapy  an unusual or allergic reaction to gemcitabine, other chemotherapy, other medicines, foods, dyes, or preservatives  pregnant or trying to get pregnant  breast-feeding How should I use this medicine? This drug is given as an infusion into a vein. It is administered in a hospital or clinic by a specially trained health care professional. Talk to your pediatrician regarding the use of this medicine in children. Special care may be needed. Overdosage: If you think you have taken too much of this medicine contact a poison control center or emergency room at once. NOTE: This medicine is only for you. Do not share this medicine with others. What if I miss a dose? It is important not to miss your dose. Call your doctor or health care professional if you are unable to keep an appointment. What may interact with this medicine?  medicines to increase blood counts like filgrastim, pegfilgrastim, sargramostim  some other chemotherapy drugs like cisplatin  vaccines Talk to your doctor or health care professional before taking any of these medicines:  acetaminophen  aspirin  ibuprofen  ketoprofen  naproxen This list may not describe all possible interactions. Give your health care provider a list of all the medicines, herbs, non-prescription drugs, or dietary supplements you use. Also tell them if you smoke, drink alcohol, or use illegal drugs. Some items may interact with your medicine. What should I watch for while using this medicine? Visit your doctor for checks on your progress. This drug may make you feel generally unwell. This is not uncommon, as chemotherapy can affect healthy cells as well as cancer cells. Report any side effects.  Continue your course of treatment even though you feel ill unless your doctor tells you to stop. In some cases, you may be given additional medicines to help with side effects. Follow all directions for their use. Call your doctor or health care professional for advice if you get a fever, chills or sore throat, or other symptoms of a cold or flu. Do not treat yourself. This drug decreases your body's  ability to fight infections. Try to avoid being around people who are sick. This medicine may increase your risk to bruise or bleed. Call your doctor or health care professional if you notice any unusual bleeding. Be careful brushing and flossing your teeth or using a toothpick because you may get an infection or bleed more easily. If you have any dental work done, tell your dentist you are receiving this medicine. Avoid taking products that contain aspirin, acetaminophen, ibuprofen, naproxen, or ketoprofen unless instructed by your doctor. These medicines may hide a fever. Do not become pregnant while taking this medicine or for 6 months after stopping it. Women should inform their doctor if they wish to become pregnant or think they might be pregnant. Men should not father a child while taking this medicine and for 3 months after stopping it. There is a potential for serious side effects to an unborn child. Talk to your health care professional or pharmacist for more information. Do not breast-feed an infant while taking this medicine or for at least 1 week after stopping it. Men should inform their doctors if they wish to father a child. This medicine may lower sperm counts. Talk with your doctor or health care professional if you are concerned about your fertility. What side effects may I notice from receiving this medicine? Side effects that you should report to your doctor or health care professional as soon as possible:  allergic reactions like skin rash, itching or hives, swelling of the face, lips,  or tongue  breathing problems  pain, redness, or irritation at site where injected  signs and symptoms of a dangerous change in heartbeat or heart rhythm like chest pain; dizziness; fast or irregular heartbeat; palpitations; feeling faint or lightheaded, falls; breathing problems  signs of decreased platelets or bleeding - bruising, pinpoint red spots on the skin, black, tarry stools, blood in the urine  signs of decreased red blood cells - unusually weak or tired, feeling faint or lightheaded, falls  signs of infection - fever or chills, cough, sore throat, pain or difficulty passing urine  signs and symptoms of kidney injury like trouble passing urine or change in the amount of urine  signs and symptoms of liver injury like dark yellow or brown urine; general ill feeling or flu-like symptoms; light-colored stools; loss of appetite; nausea; right upper belly pain; unusually weak or tired; yellowing of the eyes or skin  swelling of ankles, feet, hands Side effects that usually do not require medical attention (report to your doctor or health care professional if they continue or are bothersome):  constipation  diarrhea  hair loss  loss of appetite  nausea  rash  vomiting This list may not describe all possible side effects. Call your doctor for medical advice about side effects. You may report side effects to FDA at 1-800-FDA-1088. Where should I keep my medicine? This drug is given in a hospital or clinic and will not be stored at home. NOTE: This sheet is a summary. It may not cover all possible information. If you have questions about this medicine, talk to your doctor, pharmacist, or health care provider.  2020 [employer]/Gold Standard (2017-09-30 18:06:11)

## 2020-06-12 ENCOUNTER — Encounter: Payer: Self-pay | Admitting: Hematology and Oncology

## 2020-06-15 ENCOUNTER — Encounter: Payer: Self-pay | Admitting: Hematology and Oncology

## 2020-06-18 NOTE — Progress Notes (Signed)
DISCONTINUE ON PATHWAY REGIMEN - Non-Small Cell Lung     A cycle is every 21 days:     Pembrolizumab      Pemetrexed      Carboplatin   **Always confirm dose/schedule in your pharmacy ordering system**  REASON: Disease Progression PRIOR TREATMENT: [id number]: Pembrolizumab 200 mg + Pemetrexed 500 mg/m2 + Carboplatin AUC=5 q21 Days x 4-6 Cycles TREATMENT RESPONSE: Partial Response (PR)  START ON PATHWAY REGIMEN - Non-Small Cell Lung     A cycle is every 21 days:     Docetaxel   **Always confirm dose/schedule in your pharmacy ordering system**  Patient Characteristics: Stage IV Metastatic, Squamous, PS = 0, 1, Second Line, Prior PD-1/PD-L1 Inhibitor + Chemotherapy or No Prior PD-1/PD-L1 Inhibitor, and Not a Candidate for Immunotherapy Therapeutic Status: Stage IV Metastatic Histology: Squamous Cell Line of therapy: Second Line ECOG Performance Status: 1 PD-L1 Expression Status: PD-L1 Positive ? 50% (TPS) Immunotherapy Candidate Status: Not a Candidate for Immunotherapy Prior Immunotherapy Status: Prior PD-1/PD-L1 Inhibitor + Chemotherapy Intent of Therapy: Non-Curative / Palliative Intent, Discussed with Patient

## 2020-06-19 ENCOUNTER — Telehealth: Payer: Self-pay | Admitting: Hematology and Oncology

## 2020-06-19 NOTE — Telephone Encounter (Signed)
Re:  [facility]  Left a message on patient's phone about discussing results from [facility].   Lequita Asal, MD

## 2020-06-20 ENCOUNTER — Other Ambulatory Visit: Payer: Self-pay | Admitting: *Deleted

## 2020-06-20 DIAGNOSIS — F419 Anxiety disorder, unspecified: Secondary | ICD-10-CM

## 2020-06-20 MED ORDER — ALPRAZOLAM 0.5 MG PO TABS: 0.5000 mg | ORAL_TABLET | Freq: Four times a day (QID) | ORAL | 0 refills | Status: DC | PRN

## 2020-06-21 ENCOUNTER — Telehealth: Payer: Self-pay | Admitting: Hematology and Oncology

## 2020-06-21 NOTE — Telephone Encounter (Signed)
Re:  [facility]  Patient previously contacted to discuss results from his [facility] testing.  He was not available.  Voicemail was left.    Results reviewed with Jimmy West.  No actionable mutations.  Patient would like to proceed with Taxotere.  Lequita Asal, MD

## 2020-06-22 MED ORDER — ONDANSETRON HCL 8 MG PO TABS: 8.0000 mg | ORAL_TABLET | Freq: Three times a day (TID) | ORAL | 1 refills | Status: DC | PRN

## 2020-06-22 MED ORDER — DEXAMETHASONE 4 MG PO TABS: 8.0000 mg | ORAL_TABLET | Freq: Two times a day (BID) | ORAL | 1 refills | Status: DC

## 2020-06-23 NOTE — Progress Notes (Signed)
[facility]  [address], [address] [place],  [address] Phone: [phone]  Fax: [phone]   Clinic Day: 06/25/20  Referring physician: [name], NP  Chief Complaint: Jimmy West is a 69 y.o. male with metastatic adenocarcinoma of the lung who is seen for assessment prior to cycle #1 Taxotere.  HPI: The patient was last seen in the medical oncology clinic on 06/11/2020.  At that time, he felt "ok".  He reported fatigue and occasional shortness of breath. His pain had been stable. CT guided biopsy revealed potential transformation to squamous differentiation (or mixed squamous and adenocarcinoma).  Foundation One returned with no reportable alterations.  We discussed treatment options.  He received the high dose influenza vaccine.  During the interim, he has been "ok".  He had diarrhea for 2 days last week. He also vomited 4-5 times one morning last week; he does not remember if it was on one of the days he had diarrhea. He did not eat anything out of the ordinary. He has not had any episodes since then.   The patient reports low energy. His shortness of breath on exertion is stable, though he notes that he has not been very active. One day last week, he thought he had a fever, but is temperature was "98 point something." His mood is "not good." He had a headache at the top of his head for 2 days last week. The patient denies chest pain, vision changes, numbness, weakness, balance problems, and coordination problems. His sinus drip has cleared up.  The patient has had bilateral side pain, shoulder pain, and spine pain that has worsened over the past 2 weeks. The spine pain used to only be in his upper back, but has spread to his entire spine. He takes oxycodone 4 times per day. Sometimes he cuts it in half and sometimes he does not.  He only took 2 Decadron pills yesterday.   Past Medical History:  Diagnosis Date  . Bulging lumbar disc    . Cancer ([place])    stage 4 lung cancer  . Heart attack [facility])     Past Surgical History:  Procedure Laterality Date  . CARDIAC CATHETERIZATION     two stents  . KNEE SURGERY Left   . PORTA CATH INSERTION N/A 05/21/2018   Procedure: PORTA CATH INSERTION;  Surgeon: Algernon Huxley, MD;  Location: [facility] [place] [facility];  Service: Cardiovascular;  Laterality: N/A;    Family History  Problem Relation Age of Onset  . Heart failure Father   . Cancer Maternal Aunt   . Heart failure Maternal Uncle   . Dementia Paternal Grandmother   . Cancer Maternal Aunt     Social History:  reports that he quit smoking about 16 years ago. His smoking use included cigarettes. He has a 22.50 pack-year smoking history. He has never used smokeless tobacco. He reports previous alcohol use. He reports that he does not use drugs. He started smoking at age 86. He is smoking 1 1/2 packs/day. Patient denies known exposures to radiation ortoxins. 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 He has not been working recently as the salon was closed. He plays golf occasionally (none recently).He has 8 cats. The patient is alone today.   Allergies: No Known Allergies  Current Medications: Current Outpatient Medications  Medication Sig Dispense Refill  . ALPRAZolam (XANAX) 0.5 MG tablet Take 1 tablet (0.5 mg total) by mouth 4 (four) times  daily as needed for anxiety. 60 tablet 0  . Calcium 600-400 MG-UNIT CHEW Chew 2 tablets by mouth daily.    Marland Kitchen dexamethasone (DECADRON) 4 MG tablet Take 2 tablets (8 mg total) by mouth 2 (two) times daily. Start the day before Taxotere. Then daily after chemo for 2 days. 30 tablet 1  . ELIQUIS 5 MG TABS tablet TAKE 1 TABLET BY MOUTH TWICE DAILY 60 tablet 5  . FLUoxetine (PROZAC) 40 MG capsule Take 1 capsule (40 mg total) by mouth daily. 30 capsule 3  . ondansetron (ZOFRAN) 8 MG tablet Take 1 tablet (8 mg total) by mouth every 8 (eight) hours as needed for refractory  nausea / vomiting. 30 tablet 1  . ondansetron (ZOFRAN-ODT) 8 MG disintegrating tablet DISSOLVE 1 TABLET ON TONGUE EVERY 8 HOURS AS NEEDED FOR NAUSEA OR VOMITING 20 tablet 1  . Oxycodone HCl 20 MG TABS TAKE 1/2 TO 1 TABLET BY MOUTH EVERY 4 HOURS AS NEEDED FOR SEVERE PAIN 60 tablet 0  . polyethylene glycol (MIRALAX / GLYCOLAX) packet Take 17 g by mouth daily.    Marland Kitchen senna (SENOKOT) 8.6 MG tablet Take 1 tablet by mouth as needed.     Marland Kitchen acetaminophen (TYLENOL) 500 MG tablet Take 500 mg by mouth 3 (three) times daily as needed.  (Patient not taking: Reported on 04/11/2020)    . albuterol (VENTOLIN HFA) 108 (90 Base) MCG/ACT inhaler Inhale 2 puffs into the lungs every 6 (six) hours as needed for wheezing or shortness of breath. (Patient not taking: Reported on 04/11/2020) 18 g 3   No current facility-administered medications for this visit.   Facility-Administered Medications Ordered in Other Visits  Medication Dose Route Frequency Provider Last Rate Last Admin  . 0.9 %  sodium chloride infusion   Intravenous Once Gorden Stthomas C, MD      . 0.9 %  sodium chloride infusion   Intravenous Once PRN Dawnya Grams C, MD      . albuterol (PROVENTIL) (2.5 MG/3ML) 0.083% nebulizer solution 2.5 mg  2.5 mg Nebulization Once PRN Blase Beckner C, MD      . alteplase (CATHFLO ACTIVASE) injection 2 mg  2 mg Intracatheter Once PRN Lequita Asal, MD      . EPINEPHrine (ADRENALIN) 1 MG/10ML injection 0.25 mg  0.25 mg Intravenous Once PRN Gillermo Poch C, MD      . EPINEPHrine (ADRENALIN) 1 MG/10ML injection 0.25 mg  0.25 mg Intravenous Once PRN Orra Nolde C, MD      . heparin lock flush 100 unit/mL  500 Units Intracatheter Once PRN Mike Gip, Mariell Nester C, MD      . heparin lock flush 100 unit/mL  250 Units Intracatheter Once PRN Nolon Stalls C, MD      . sodium chloride flush (NS) 0.9 % injection 10 mL  10 mL Intravenous PRN Nolon Stalls C, MD   10 mL at 03/04/19 0959  . sodium chloride  flush (NS) 0.9 % injection 10 mL  10 mL Intracatheter Once PRN Tamico Mundo C, MD      . sodium chloride flush (NS) 0.9 % injection 3 mL  3 mL Intracatheter Once PRN Lequita Asal, MD        Review of Systems  Constitutional: Positive for malaise/fatigue. Negative for chills, diaphoresis, fever and weight loss (up 2 lbs).  HENT: Negative.  Negative for congestion, ear discharge, ear pain, hearing loss, nosebleeds, sinus pain, sore throat and tinnitus.        Sinus  issues have cleared up.  Eyes: Negative.  Negative for blurred vision and double vision.  Respiratory: Positive for shortness of breath (on exertion). Negative for cough, hemoptysis and sputum production.   Cardiovascular: Negative.  Negative for chest pain, palpitations, orthopnea and leg swelling.  Gastrointestinal: Positive for diarrhea (x 2 days last week) and vomiting (4-5 x one morning). Negative for abdominal pain, blood in stool, constipation, heartburn, melena and nausea.  Genitourinary: Negative.  Negative for dysuria, frequency, hematuria and urgency.  Musculoskeletal: Positive for back pain (entire spine, worse at the top). Negative for falls, joint pain, myalgias and neck pain.       Shoulder pain and "side pain."  Skin: Negative.  Negative for itching and rash.  Neurological: Positive for headaches (top of head). Negative for dizziness, tingling, sensory change, focal weakness and weakness.  Endo/Heme/Allergies: Negative for environmental allergies and polydipsia. Does not bruise/bleed easily.  Psychiatric/Behavioral: Positive for depression (mood is "not good"). Negative for memory loss. The patient is nervous/anxious (on Xanax). The patient does not have insomnia (on OTC medication).   All other systems reviewed and are negative.  Performance status (ECOG): 1  Vitals Blood pressure 116/72, pulse 85, temperature (!) 97 F (36.1 C), temperature source Tympanic, resp. rate 18, weight 129 lb 8.3 oz (58.7 kg),  SpO2 100 %.   Physical Exam Vitals and nursing note reviewed.  Constitutional:      General: He is not in acute distress.    Appearance: He is well-developed. He is not diaphoretic.     Interventions: Face mask in place.  HENT:     Head: Normocephalic and atraumatic.     Comments: Thin brown graying hair. Mustache.    Right Ear: Hearing normal.     Mouth/Throat:     Mouth: Mucous membranes are moist. No oral lesions.     Pharynx: Oropharynx is clear.     Comments: Dentures. Eyes:     General: No scleral icterus.    Extraocular Movements: Extraocular movements intact.     Conjunctiva/sclera: Conjunctivae normal.     Pupils: Pupils are equal, round, and reactive to light.     Comments: Glasses.  Gray/blue eyes.  Neck:     Vascular: No JVD.  Cardiovascular:     Rate and Rhythm: Normal rate and regular rhythm.     Heart sounds: Normal heart sounds. No murmur heard.  No friction rub. No gallop.   Pulmonary:     Effort: Pulmonary effort is normal. No respiratory distress.     Breath sounds: No wheezing or rhonchi.  Chest:     Chest wall: No tenderness.  Abdominal:     General: Bowel sounds are normal. There is no distension.     Palpations: Abdomen is soft. There is no hepatomegaly, splenomegaly or mass.     Tenderness: There is no abdominal tenderness. There is no guarding or rebound.  Musculoskeletal:        General: Tenderness (entire spine) present. No swelling. Normal range of motion.     Cervical back: Normal range of motion and neck supple.  Lymphadenopathy:     Head:     Right side of head: No preauricular, posterior auricular or occipital adenopathy.     Left side of head: No preauricular, posterior auricular or occipital adenopathy.     Cervical: No cervical adenopathy.     Upper Body:     Right upper body: No supraclavicular or axillary adenopathy.     Left upper body: No  supraclavicular or axillary adenopathy.     Lower Body: No right inguinal adenopathy. No left  inguinal adenopathy.  Skin:    General: Skin is warm and dry.     Coloration: Skin is not pale.     Findings: No bruising, erythema, lesion or rash.  Neurological:     Mental Status: He is alert and oriented to person, place, and time. Mental status is at baseline.     Comments: Cranial nerves II-XII are normal. Upper and lower extremity strength and sensation are normal. Patellar reflexes are normal. RAM normal. Finger to nose test normal.  Difficulty walking heel to toe in a straight line.  Psychiatric:        Behavior: Behavior normal.        Thought Content: Thought content normal.        Judgment: Judgment normal.    Imaging studies: 04/30/2018:  PET scanrevealed a 3.4 cm hypermetabolic RLL pulmonary mass (SUV 13.2), 11 mm pulmonary nodule in the LEFT lung (SUV 4.5), RIGHT paratracheal lymph node (SUV 5.1), and hypermetabolic activity within the T4 vertebral body (SUV 10.2).  05/03/2018:  Thoracic spine MRIon 05/03/2018 revealed T4 metastasiswith a 40% pathologic compression deformity and 5 mm of retropulsion of the vertebral body. Retropulsion results in mild spinal canal stenosis and mild bilateral C4-5 foraminal stenosis. There was paravertebral soft tissue thickening from mid T3 to mid T5, likely representing edema related to the pathologic compression deformity vs. possible extraosseous extension of the neoplasm. There were no additional thoracic spinal metastases noted.  05/03/2018:  Head MRIrevealed no intracranial metastatic disease. 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 Incidental mention made of mild paranasal sinus disease. 01/10/2019:  Cervical and thoracic spine CT at [facility] unchanged severe compression deformity/vertebral plana of T4 with a stable degree of retropulsion and focal mild  spinal canal stenosis. There was interval enlargement of a right lower lobe pulmonary mass(4.5 x 3.5 cm compared to 3.9 x 2.9 cm)with redemonstrated spiculated pulmonary nodules. 01/28/2019:  Chest, abdomen, pelvisCTrevealed slight interval enlargement of a right lower lobe mass with central necrosis measuring 4.6 x 3.4 cm, previously 4.0 x 3.0 cm. There was no change in right upper lobe nodules(1.4 and 0.8 cm).Therewas almost no residua of left lung nodules, with irregular opacities in the apical left upper lobeandsuperior segment left lower lobe. There was no change in right hilar soft tissue and lymph nodes.There wasvertebra plana deformity of T4andno evidence of new osseous metastatic disease.There was no evidence of distant metastatic disease in the abdomen or pelvis.The1.6 x 1.1 cmleft adrenal nodule(non-metabolic on prior PET scan),was unchanged. 03/24/2019:  Renal ultrasoundrevealed no acute abnormality identified.There was no hydronephrosis or bladder distention. 04/23/2019:  Abdomen and pelvis CT revealed no acute abdominal or pelvic pathology. There was fluid in the colon which can be seen with diarrhea. There was diverticulosis without evidence of diverticulitis. There was a stable 1.5 cm left adrenal nodule. 05/06/2019:  Chest, abdomen, and pelvisCTrevealed a positive response to interval therapy for the known right lower lobe lung carcinoma(4.6 x 3.4 cm to3.1 x 2.8 cm transversely). There hadbeen a mild interval decrease contiguous soft tissue that extends along the right hilum. The two right upper lobe noduleswerestable from the most recent prior study (smaller than 04/27/2018).There are no new lung nodules.Therewasno evidence of new metastatic disease.There was stable severe compression deformity/vertebra plana of  T4.Therewasno evidence of other osseous metastatic disease.There was a stable left adrenal nodule consistent with an adenoma. 05/30/2019:   ChestCT angiogramrevealed a tiny subsegmental pulmonary embolusin the right lower lobe. There areas of ground-glass at the periphery of the right chest hadbecome more conspicuous. This mayrelate to mild pneumonitis, perhaps due to radiation, attention on follow-up.The dominant nodule in the right lung basewasstable in size. Heis onEliquis. 08/14/2019:  Cervical spine MRIrevealed no evidence of metastatic disease within the cervical spine. 09/16/2019:  Chest, abdomen and pelvis CTrevealed asimilar-appearing spiculated right lower lobe nodule.There was no evidence for metastatic disease in the abdomen or pelvis.There was stable left adrenal adenoma. 12/15/2019:  Chest CT revealed an enlarging right lower lobe mass c/w malignancy (3.0 x 2.4 cm to 3.3 x 2.7 cm).  There was slight enlargement in a spiculated right upper lobe subpleural nodule (0.8 x 1.5 cm to 1.2 x 0.7 cm). 01/16/2020:  Bone scan revealed no scintigraphic evidence of osseous metastatic disease. 03/21/2020:  Chest CT angiogram revealed no evidence of pulmonary embolism.  A rounded spiculated mass in the RUL measuring 3.4 x 3 cm which was slightly enlarged with adjacent pleural thickening.  There was a stable 1.4 x 0.6 cm irregular subpleural density in the RUL.  There was stable severe compression deformity in the upper thoracic vertebral body consistent with an old fracture.   No visits with results within 3 Day(s) from this visit.  Latest known visit with results is:  Hospital Outpatient Visit on 05/09/2020  Component Date Value Ref Range Status  . WBC 05/09/2020 11.5* 4.0 - 10.5 K/uL Final  . RBC 05/09/2020 3.43* 4.22 - 5.81 MIL/uL Final  . Hemoglobin 05/09/2020 10.0* 13.0 - 17.0 g/dL Final  . HCT 05/09/2020 31.8* 39 - 52 % Final  . MCV 05/09/2020 92.7  80.0 - 100.0 fL Final  . MCH 05/09/2020 29.2  26.0 - 34.0 pg Final  . MCHC 05/09/2020 31.4  30.0 - 36.0 g/dL Final  . RDW 05/09/2020 17.4* 11.5 - 15.5 % Final  .  Platelets 05/09/2020 287  150 - 400 K/uL Final  . nRBC 05/09/2020 0.0  0.0 - 0.2 % Final   Performed at [facility], [address], [place], [place] [address]  . Prothrombin Time 05/09/2020 12.7  11.4 - 15.2 seconds Final  . INR 05/09/2020 1.0  0.8 - 1.2 Final   Comment: (NOTE) INR goal varies based on device and disease states. Performed at [facility], [address], [place], [place] [address]   . SURGICAL PATHOLOGY 05/09/2020    Final-Edited                   Value:SURGICAL PATHOLOGY CASE: [id number] PATIENT: Shirley Muscat Surgical Pathology Report  Specimen Submitted: A. Right lower lobe lung mass biopsy  Clinical History: History of lung cancer, now with enlarging right lower lobe pulmonary nodule, post CT guided bx.  DIAGNOSIS: A. LUNG, RIGHT LOWER LOBE; CT-GUIDED CORE NEEDLE BIOPSY: - POSITIVE FOR MALIGNANCY. - NON-SMALL CELL CARCINOMA, IN A BACKGROUND OF SIGNIFICANT NECROSIS, SEE COMMENT.  Comment: Immunohistochemical studies show strong staining for CK7, both in viable tumor cells and necrotic debris. There is patchy marking for p40 and p63. TTF-1 and Napsin-A are negative.  The patient's history of pulmonary adenocarcinoma ([id number]) is noted, and this case is reviewed in conjunction with the previous material. The previous case showed positive staining for TTF-1 and Napsin-A, and histologic evaluation showed tumor cells with intracytoplasmic vacuoles, all features compatible with adenocarcinoma.  These histologic features are not seen in the current case, and the tumor present in the current sample is negative for TTF-1 and Napsin-A. The significance of these changes is unclear. This may represent potential transformation to a carcinoma with squamous differentiation, or the original biopsy could have represented the glandular component of an adenosquamous carcinoma. Clinical correlation is  recommended.  There is sufficient tissue present for ancillary testing in blocks A1 and A3, with limited tissue in block A2.  IHC slides were prepared by [facility], [place], [place]. All controls stained appropriately.  This test was developed and its performance characteristics determined by [facility]. It has not been cleared or approved by the [place] Food and Drug Administration. The FDA does not require this test to go through premarket FDA review. This test is used for clinical purposes. It should not be regarded as investigational or for resea                         rch. This laboratory is certified under the Clinical Laboratory Improvement Amendments (CLIA) as qualified to perform high complexity clinical laboratory testing.  GROSS DESCRIPTION: A. Labeled: Right lower lobe lung mass Received: In formalin Tissue fragment(s): 3 Size: From 1.2 x 1.5 cm in length by 0.1 cm in diameter Description: Pink-red to tan cylindrically shaped tissue fragments Entirely submitted in 3 cassettes.  Final Diagnosis performed by Allena Napoleon, MD.   Electronically signed 05/16/2020 2:48:57PM The electronic signature indicates that the named Attending Pathologist has evaluated the specimen Technical component performed at [facility], [address] [place], [place] [address] Lab: [phone] Dir: Rush Farmer, MD, MMM  Professional component performed at [facility], [facility], [place], [place], [place] [address] Lab: [phone] Dir: Dellia Nims. Reuel Derby, MD    Assessment:  Deondray Ospina is a 69 y.o. male with metastatichigh-grade adenocarcinomaof the right lungs/p CT-guided biopsy of a RLL lung mass on 05/11/2018. Pathologyrevealed an invasive high-grade adenocarcinoma, with predominantly solid growth pattern. The neoplastic cells were TTF-1 (+), Napsin A (+), and P40 (+). He has a T4 vertebral metastasis. Clinical stage is  T4N1M1.  There was not enough material for Foundation One testing. PDL-1revealed TPS 90%.  PET scanon 04/30/2018 revealed a 3.4 cm hypermetabolic RLL pulmonary mass (SUV 13.2), 11 mm pulmonary nodule in the LEFT lung (SUV 4.5), RIGHT paratracheal lymph node (SUV 5.1), and hypermetabolic activity within the T4 vertebral body (SUV 10.2).   Hereceived 11cycles ofpembrolizumab(05/24/2018 - 02/07/2019). He toleratedtreatment well. CEAwas 1.3 on 08/30/2018.LDHwas 165 on 11/29/2018.  He completed T4 radiationon 07/07/2018. He receives Xgevamonthly (06/03/2018 -03/12/2020).  He received 1 cycle of carboplatin, Alimta, and pembrolizumabon 02/08/2019. Cycle #1 was complicated by nausea, vomiting, and diarrhea necessitating hospitalization.Decision made to hold carboplatin with his second dose.He iss/p2cycles ofAlimta and pembrolizumab (02/28/2019 - 03/29/2019).Cycle #2 was complicated by nausea, vomiting, and dehydration requring fluids in clinic and an overnight stay in the hospital. Hereceivedcarboplatin (AUC 2), Alimta, and pebrolizumab(04/18/2019).He tolerated it poorly.   Hehasreceived18 cycles ofAlimta and pembrolizumabon (05/09/2019 -04/11/2020). He began Fulphila with cycle #14 secondary to neutropenia.  He has tolerated chemotherapy well. He receives B12every 9 weeks (last11/10/2019).  Chest CT angiogram on 03/21/2020 revealed no evidence of pulmonary embolism.  A rounded spiculated 3.4 x 3 cm mass in the RUL was slightly enlarged with adjacent pleural thickening.  There was a stable 1.4 x 0.6 cm irregular subpleural density in the RUL.  There was stable severe  compression deformity in the upper thoracic vertebral body consistent with an old fracture.  CT guided right lower lobe lung biopsy on 05/09/2020 revealed non-small cell carcinoma in a background of significant necrosis. Immunohistochemical studies showed strong staining for CK7, both in  viable tumor cells and necrotic debris. There was patchy marking for p40 and p63. TTF-1 and Napsin-A were negative.  This may represent potential transformation to a carcinoma with squamous differentiation or the original biopsy could have represented the glandular component of an adenosquamous carcinoma.  Foundation One on 05/09/2020 revealed no reportable alterations.  Microsatellite status was stable, tumor mutational burden 5 Muts/MB, CDKN2A loss, CDKN2B loss, KRAS G12D, MTAP loss, NF2 E442*, STK11 P203 fs*84 and TP53 Y205S.  There were no reportable alterations in ALK, BRAF, EGFR, ERBB2, MET, RET, and ROS1.  He hascancer-related painin T4. He is off the [employer].  He is taking oxycodone10-36m every 4 hours prn.  He has cervicalgia.Cervical spine MRIon 08/14/2019 showed no evidence of metastatic disease within the cervical spine. Heis followedby Dr YIzora Ribas  He has chemotherapy induced anemia.  He received Retacrit on 11/01/2019 (last 04/09/2020).  Hehad transient renal insufficiencyon 03/23/2019. Creatinine was 1.32 (baseline 0.61-1.0). Renal ultrasoundon 03/24/2019 revealed no acute abnormality identified.There was no hydronephrosis or bladder distention.Creatinine is 0.82 today.  Stool waspositive for C difficile + diarrheaon 06/14/2019.Hewas treated with oral vancomycin.  He received the high dose influenza vaccine on 06/11/2020  Symptomatically, he notes low energy. His shortness of breath on exertion is stable.  He had a headache at the top of his head for 2 days.  He has had bilateral side pain, shoulder pain, and spine pain that has worsened over the past 2 weeks. The spine pain used to only be in his upper back, but has spread to his entire spine. He takes oxycodone 4 times per day.  Exam reveals focal areas of pain.  Plan: 1.   Labs: CBC with diff, CMP, Mg 2. Metastatic high-grade adenocarcinoma the RIGHT lung He is s/p 11 cycles of  pembrolizumab. He is s/p 1 cycle of carboplatin, Almita, and pembrolizumab (02/08/2019). He is s/p 1 cycle of carboplatin (AUC 2), Alimta, and pembrolizumab (04/18/2019). He is s/p18cycles of Alimta and pembrolizumab (02/28/2019 - 03/29/2019; 05/09/2019-04/11/2020). Chest CT angiogram on 09/01/2021revealed slight growth in the RUL mass.  CT guided biopsy RLL lung biopsy on 05/09/2020 revealed potential transformation to squamous differentiation.   Foundation One revealed no driver mutations.     Patient is intolerant to carboplatin at an AUC of 5 as well as 2.  Review plans for Taxotere.   Review potential side effects of Taxotere.  Symptomatically, he notes several new complaints.   Discuss restaging studies prior to initiation of new chemotherapy.    Head, chest, abdomen and pelvic CT scan as well as bone scan.   Patient in agreement.   Review use of Decadron pre and post Taxotere.  Discuss symptom management.  He has antiemetics and pain medications at home to use on a prn bases.  Interventions are adequate. 3. Bone metastasis Symptomatically, he appears to have new bone pain.  Bone scan on 01/16/2020 revealed no evidence of active metastatic disease.  He last received Xgeva on 03/12/2020.  Discuss plan for follow-up bone scan. 4.Anxiety and depression Clinically, he has chronic anxiety and depression.   He is on low-dose Xanax as well as Prozac.  Continue to monitor. 5.Pulmonary embolism He denies any bleeding, shortness of breath or lower extremity edema.  Continue Eliquis. 6.  Headache  Etiology unclear.  Discuss plans for head CT to rule out metastasis. 7.   No treatment today (patient did not take steroids correctly). 8.   Schedule head, chest, abdomen, and pelvis CT. 9.   Schedule bone scan. 10.   RTC after imaging for MD assessment, labs (CBC with diff, CMP, Mg), review of scans and cycle #1 Taxotere.  I discussed the  assessment and treatment plan with the patient.  The patient was provided an opportunity to ask questions and all were answered.  The patient agreed with the plan and demonstrated an understanding of the instructions.  The patient was advised to call back if the symptoms worsen or if the condition fails to improve as anticipated.  I provided 29 minutes of face-to-face time during this this encounter and > 50% was spent counseling as documented under my assessment and plan.  An additional 5 minutes were spent reviewing his chart (Epic and Care Everywhere) including notes, labs, and imaging studies.    Lequita Asal, MD, PhD 06/25/2020; 5:00 PM  I, Mirian Mo Tufford, am acting as a Education administrator for [employer]. Mike Gip, MD.   I, Cidney Kirkwood C. Mike Gip, MD, have reviewed the above documentation for accuracy and completeness, and I agree with the above.

## 2020-06-25 ENCOUNTER — Inpatient Hospital Stay: Payer: Medicare Other

## 2020-06-25 ENCOUNTER — Other Ambulatory Visit: Payer: Self-pay | Admitting: *Deleted

## 2020-06-25 ENCOUNTER — Encounter: Payer: Self-pay | Admitting: Hematology and Oncology

## 2020-06-25 ENCOUNTER — Inpatient Hospital Stay: Payer: Medicare Other | Attending: Hematology and Oncology | Admitting: Hematology and Oncology

## 2020-06-25 ENCOUNTER — Other Ambulatory Visit: Payer: Self-pay

## 2020-06-25 VITALS — BP 116/72 | HR 85 | Temp 97.0°F | Resp 18 | Wt 129.5 lb

## 2020-06-25 DIAGNOSIS — D6481 Anemia due to antineoplastic chemotherapy: Secondary | ICD-10-CM

## 2020-06-25 DIAGNOSIS — Z7189 Other specified counseling: Secondary | ICD-10-CM

## 2020-06-25 DIAGNOSIS — Z5111 Encounter for antineoplastic chemotherapy: Secondary | ICD-10-CM

## 2020-06-25 DIAGNOSIS — R519 Headache, unspecified: Secondary | ICD-10-CM

## 2020-06-25 DIAGNOSIS — C3431 Malignant neoplasm of lower lobe, right bronchus or lung: Secondary | ICD-10-CM

## 2020-06-25 DIAGNOSIS — C7951 Secondary malignant neoplasm of bone: Secondary | ICD-10-CM | POA: Diagnosis not present

## 2020-06-25 DIAGNOSIS — G893 Neoplasm related pain (acute) (chronic): Secondary | ICD-10-CM

## 2020-06-25 DIAGNOSIS — Z95828 Presence of other vascular implants and grafts: Secondary | ICD-10-CM

## 2020-06-25 DIAGNOSIS — I2782 Chronic pulmonary embolism: Secondary | ICD-10-CM

## 2020-06-25 DIAGNOSIS — Z923 Personal history of irradiation: Secondary | ICD-10-CM | POA: Insufficient documentation

## 2020-06-25 DIAGNOSIS — Z7901 Long term (current) use of anticoagulants: Secondary | ICD-10-CM

## 2020-06-25 DIAGNOSIS — Z86711 Personal history of pulmonary embolism: Secondary | ICD-10-CM | POA: Diagnosis not present

## 2020-06-25 DIAGNOSIS — Z79899 Other long term (current) drug therapy: Secondary | ICD-10-CM | POA: Diagnosis not present

## 2020-06-25 DIAGNOSIS — Z5112 Encounter for antineoplastic immunotherapy: Secondary | ICD-10-CM | POA: Insufficient documentation

## 2020-06-25 DIAGNOSIS — T451X5A Adverse effect of antineoplastic and immunosuppressive drugs, initial encounter: Secondary | ICD-10-CM

## 2020-06-25 LAB — CBC WITH DIFFERENTIAL/PLATELET
Abs Immature Granulocytes: 0.03 10*3/uL (ref 0.00–0.07)
Basophils Absolute: 0 10*3/uL (ref 0.0–0.1)
Basophils Relative: 0 %
Eosinophils Absolute: 0 10*3/uL (ref 0.0–0.5)
Eosinophils Relative: 0 %
HCT: 35.5 % — ABNORMAL LOW (ref 39.0–52.0)
Hemoglobin: 11.3 g/dL — ABNORMAL LOW (ref 13.0–17.0)
Immature Granulocytes: 0 %
Lymphocytes Relative: 13 %
Lymphs Abs: 0.9 10*3/uL (ref 0.7–4.0)
MCH: 27.1 pg (ref 26.0–34.0)
MCHC: 31.8 g/dL (ref 30.0–36.0)
MCV: 85.1 fL (ref 80.0–100.0)
Monocytes Absolute: 0.3 10*3/uL (ref 0.1–1.0)
Monocytes Relative: 4 %
Neutro Abs: 5.8 10*3/uL (ref 1.7–7.7)
Neutrophils Relative %: 83 %
Platelets: 246 10*3/uL (ref 150–400)
RBC: 4.17 MIL/uL — ABNORMAL LOW (ref 4.22–5.81)
RDW: 14.3 % (ref 11.5–15.5)
WBC: 7 10*3/uL (ref 4.0–10.5)
nRBC: 0 % (ref 0.0–0.2)

## 2020-06-25 LAB — COMPREHENSIVE METABOLIC PANEL
ALT: 17 U/L (ref 0–44)
AST: 24 U/L (ref 15–41)
Albumin: 3.8 g/dL (ref 3.5–5.0)
Alkaline Phosphatase: 62 U/L (ref 38–126)
Anion gap: 11 (ref 5–15)
BUN: 16 mg/dL (ref 8–23)
CO2: 23 mmol/L (ref 22–32)
Calcium: 9.3 mg/dL (ref 8.9–10.3)
Chloride: 102 mmol/L (ref 98–111)
Creatinine, Ser: 1.11 mg/dL (ref 0.61–1.24)
GFR, Estimated: >60 mL/min (ref >60)
Glucose, Bld: 162 mg/dL — ABNORMAL HIGH (ref 70–99)
Potassium: 3.6 mmol/L (ref 3.5–5.1)
Sodium: 136 mmol/L (ref 135–145)
Total Bilirubin: 0.7 mg/dL (ref 0.3–1.2)
Total Protein: 7 g/dL (ref 6.5–8.1)

## 2020-06-25 LAB — MAGNESIUM: Magnesium: 1.9 mg/dL (ref 1.7–2.4)

## 2020-06-25 MED ORDER — HEPARIN SOD (PORK) LOCK FLUSH 100 UNIT/ML IV SOLN
500.0000 [IU] | Freq: Once | INTRAVENOUS | Status: AC
  Administered 2020-06-25: 500 [IU] via INTRAVENOUS
  Filled 2020-06-25: qty 5

## 2020-06-25 MED ORDER — OXYCODONE HCL 20 MG PO TABS: ORAL_TABLET | ORAL | 0 refills | Status: DC

## 2020-06-25 NOTE — Progress Notes (Signed)
No treatment today per Dr. Mike Gip. Port dc'd. Patient discharged to home in stable condition.

## 2020-06-25 NOTE — Patient Instructions (Signed)
  How to take Decadron:  Take 2 tablets (8 mg total) by mouth 2 (two) times daily. Start the day before Taxotere. Then daily after chemo for 2 days.

## 2020-06-26 ENCOUNTER — Encounter
Admission: RE | Admit: 2020-06-26 | Discharge: 2020-06-26 | Disposition: A | Discharge status: Home / Self Care | Payer: Medicare Other | Source: Ambulatory Visit | Attending: Hematology and Oncology | Admitting: Hematology and Oncology

## 2020-06-26 ENCOUNTER — Ambulatory Visit
Admission: RE | Admit: 2020-06-26 | Discharge: 2020-06-26 | Disposition: A | Discharge status: Home / Self Care | Payer: Medicare Other | Source: Ambulatory Visit | Attending: Hematology and Oncology | Admitting: Hematology and Oncology

## 2020-06-26 DIAGNOSIS — C7951 Secondary malignant neoplasm of bone: Secondary | ICD-10-CM | POA: Insufficient documentation

## 2020-06-26 DIAGNOSIS — C3431 Malignant neoplasm of lower lobe, right bronchus or lung: Secondary | ICD-10-CM | POA: Insufficient documentation

## 2020-06-26 DIAGNOSIS — I6782 Cerebral ischemia: Secondary | ICD-10-CM | POA: Diagnosis not present

## 2020-06-26 DIAGNOSIS — G9389 Other specified disorders of brain: Secondary | ICD-10-CM | POA: Diagnosis not present

## 2020-06-26 DIAGNOSIS — I6389 Other cerebral infarction: Secondary | ICD-10-CM | POA: Diagnosis not present

## 2020-06-26 DIAGNOSIS — C3490 Malignant neoplasm of unspecified part of unspecified bronchus or lung: Secondary | ICD-10-CM | POA: Diagnosis not present

## 2020-06-26 DIAGNOSIS — I672 Cerebral atherosclerosis: Secondary | ICD-10-CM | POA: Diagnosis not present

## 2020-06-26 MED ORDER — TECHNETIUM TC 99M MEDRONATE IV KIT
20.0000 | PACK | Freq: Once | INTRAVENOUS | Status: AC | PRN
  Administered 2020-06-26: 22.36 via INTRAVENOUS

## 2020-06-26 MED ORDER — IOHEXOL 300 MG/ML  SOLN
100.0000 mL | Freq: Once | INTRAMUSCULAR | Status: AC | PRN
  Administered 2020-06-26: 100 mL via INTRAVENOUS

## 2020-06-26 NOTE — Progress Notes (Signed)
[facility]  [address] [place], [place] [address] Phone: [phone]  Fax: [phone]   Clinic Day: 06/27/20  Referring physician: [name], NP  Chief Complaint: Jimmy West is a 69 y.o. male with metastatic adenocarcinoma of the lung who is seen for assessment prior to cycle #1 Taxotere.  HPI: The patient was last seen in the medical oncology clinic on 06/25/2020. At that time, he felt "ok".  Energy level was "low".  He described a headache for 2 days.  He had bilateral side, shoulder, and spine pain.  Mood was not good.  He had not taken his Decadron premedication correctly.  Hematocrit was 35.5, hemoglobin 11.3, MCV 85.1, platelets 246,000, WBC 7,000.  Chemotherapy was held and restaging studies performed.  Head CT with or without contrast on 06/26/2020 revealed no evidence of intracranial metastasis.  Chest, abdomen, and pelvis CT with contrast on 06/26/2020 revealed a 3.1 x 2.4 cm (previously 3.4 x 3.0 cm) spiculated mass in the superior segment of the RLL.  There was a stable 1.4 x 0.7 cm spiculated subpleural nodule in the posterolateral RUL.  There was a stable high grade vertebral plana deformity of the T4 vertebral body.  There was no evidence of metastatic disease in the abdomen or pelvis.  There was mild stable splenomegaly (13.6 cm).  Bone scan on 06/26/2020 revealed no evidence of metastatic disease.  During the interim, he has been "okay". He felt a lot better yesterday, which may be due to the fact that he took steroids.  He is taking folic acid.   Past Medical History:  Diagnosis Date  . Bulging lumbar disc   . Cancer ([place])    stage 4 lung cancer  . Heart attack [facility])     Past Surgical History:  Procedure Laterality Date  . CARDIAC CATHETERIZATION     two stents  . KNEE SURGERY Left   . PORTA CATH INSERTION N/A 05/21/2018   Procedure: PORTA CATH INSERTION;  Surgeon: Algernon Huxley, MD;  Location: [place] [facility];  Service: Cardiovascular;  Laterality: N/A;    Family History  Problem Relation Age of Onset  . Heart failure Father   . Cancer Maternal Aunt   . Heart failure Maternal Uncle   . Dementia Paternal Grandmother   . Cancer Maternal Aunt     Social History:  reports that he quit smoking about 16 years ago. His smoking use included cigarettes. He has a 22.50 pack-year smoking history. He has never used smokeless tobacco. He reports previous alcohol use. He reports that he does not use drugs. He started smoking at age 36. He is smoking 1 1/2 packs/day. Patient denies known exposures to radiation ortoxins. 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 He has not been working recently as the salon was closed. He plays golf occasionally (none recently).He has 8 cats. The patient is alone today.   Allergies: No Known Allergies  Current Medications: Current Outpatient Medications  Medication Sig Dispense Refill  . albuterol (VENTOLIN HFA) 108 (90 Base) MCG/ACT inhaler Inhale 2 puffs into the lungs every 6 (six) hours as needed for wheezing or shortness of breath. 18 g 3  . ALPRAZolam (XANAX) 0.5 MG tablet Take 1 tablet (0.5 mg total) by mouth 4 (four) times daily as needed for anxiety. 60 tablet 0  . Calcium 600-400 MG-UNIT CHEW Chew 2 tablets by mouth daily.    Marland Kitchen dexamethasone (DECADRON) 4 MG tablet  Take 2 tablets (8 mg total) by mouth 2 (two) times daily. Start the day before Taxotere. Then daily after chemo for 2 days. 30 tablet 1  . ELIQUIS 5 MG TABS tablet TAKE 1 TABLET BY MOUTH TWICE DAILY 60 tablet 5  . FLUoxetine (PROZAC) 40 MG capsule Take 1 capsule (40 mg total) by mouth daily. 30 capsule 3  . folic acid (FOLVITE) 1 MG tablet Take 1 tablet (1 mg total) by mouth daily. Continue until 21 days after Alimta completed.    . ondansetron (ZOFRAN) 8 MG tablet Take 1 tablet (8 mg total) by mouth every 8 (eight) hours as needed for refractory nausea /  vomiting. 30 tablet 1  . ondansetron (ZOFRAN-ODT) 8 MG disintegrating tablet DISSOLVE 1 TABLET ON TONGUE EVERY 8 HOURS AS NEEDED FOR NAUSEA OR VOMITING 20 tablet 1  . Oxycodone HCl 20 MG TABS TAKE 1/2 TO 1 TABLET BY MOUTH EVERY 4 HOURS AS NEEDED FOR SEVERE PAIN 60 tablet 0  . polyethylene glycol (MIRALAX / GLYCOLAX) packet Take 17 g by mouth daily.    Marland Kitchen senna (SENOKOT) 8.6 MG tablet Take 1 tablet by mouth as needed.     Marland Kitchen acetaminophen (TYLENOL) 500 MG tablet Take 500 mg by mouth 3 (three) times daily as needed.  (Patient not taking: Reported on 04/11/2020)     No current facility-administered medications for this visit.   Facility-Administered Medications Ordered in Other Visits  Medication Dose Route Frequency Provider Last Rate Last Admin  . 0.9 %  sodium chloride infusion   Intravenous Once Chea Malan C, MD      . 0.9 %  sodium chloride infusion   Intravenous Once PRN Mikell Kazlauskas C, MD      . albuterol (PROVENTIL) (2.5 MG/3ML) 0.083% nebulizer solution 2.5 mg  2.5 mg Nebulization Once PRN Skylynne Schlechter C, MD      . alteplase (CATHFLO ACTIVASE) injection 2 mg  2 mg Intracatheter Once PRN Lequita Asal, MD      . EPINEPHrine (ADRENALIN) 1 MG/10ML injection 0.25 mg  0.25 mg Intravenous Once PRN Debbe Crumble C, MD      . EPINEPHrine (ADRENALIN) 1 MG/10ML injection 0.25 mg  0.25 mg Intravenous Once PRN Emerick Weatherly C, MD      . heparin lock flush 100 unit/mL  500 Units Intracatheter Once PRN Mike Gip, Rea Kalama C, MD      . heparin lock flush 100 unit/mL  250 Units Intracatheter Once PRN Nolon Stalls C, MD      . sodium chloride flush (NS) 0.9 % injection 10 mL  10 mL Intravenous PRN Nolon Stalls C, MD   10 mL at 03/04/19 0959  . sodium chloride flush (NS) 0.9 % injection 10 mL  10 mL Intracatheter Once PRN Laketra Bowdish C, MD      . sodium chloride flush (NS) 0.9 % injection 3 mL  3 mL Intracatheter Once PRN Lequita Asal, MD        Review of  Systems  Constitutional: Positive for malaise/fatigue. Negative for chills, diaphoresis, fever and weight loss (up 4 lbs).       Feels "pretty good".  HENT: Negative.  Negative for congestion, ear discharge, ear pain, hearing loss, nosebleeds, sinus pain, sore throat and tinnitus.        Sinus issues have cleared up.  Eyes: Negative.  Negative for blurred vision and double vision.  Respiratory: Positive for shortness of breath (on exertion). Negative for cough, hemoptysis and sputum production.  Cardiovascular: Negative.  Negative for chest pain, palpitations, orthopnea and leg swelling.  Gastrointestinal: Negative for abdominal pain, blood in stool, constipation, diarrhea, heartburn, melena, nausea and vomiting.  Genitourinary: Negative.  Negative for dysuria, frequency, hematuria and urgency.  Musculoskeletal: Positive for back pain (entire spine, worse at the top). Negative for falls, joint pain, myalgias and neck pain.       Shoulder pain and "side pain."  Skin: Negative.  Negative for itching and rash.  Neurological: Positive for headaches (top of head). Negative for dizziness, tingling, sensory change, focal weakness and weakness.  Endo/Heme/Allergies: Negative for environmental allergies and polydipsia. Does not bruise/bleed easily.  Psychiatric/Behavioral: Positive for depression (mood is "not good"). Negative for memory loss. The patient is nervous/anxious (on Xanax). The patient does not have insomnia (on OTC medication).   All other systems reviewed and are negative.  Performance status (ECOG): 1  Vitals Blood pressure 108/67, pulse (!) 56, temperature (!) 97.3 F (36.3 C), resp. rate 18, weight 133 lb 7.8 oz (60.6 kg), SpO2 99 %.   Physical Exam Vitals and nursing note reviewed.  Constitutional:      General: He is not in acute distress.    Appearance: He is well-developed. He is not diaphoretic.     Interventions: Face mask in place.  HENT:     Head:     Comments: Thin  brown graying hair. Mustache. Eyes:     General: No scleral icterus.    Conjunctiva/sclera: Conjunctivae normal.     Comments: Glasses.  Gray/blue eyes.  Neurological:     Mental Status: He is alert and oriented to person, place, and time. Mental status is at baseline.  Psychiatric:        Behavior: Behavior normal.        Thought Content: Thought content normal.        Judgment: Judgment normal.    Imaging studies: 04/30/2018:  PET scanrevealed a 3.4 cm hypermetabolic RLL pulmonary mass (SUV 13.2), 11 mm pulmonary nodule in the LEFT lung (SUV 4.5), RIGHT paratracheal lymph node (SUV 5.1), and hypermetabolic activity within the T4 vertebral body (SUV 10.2).  05/03/2018:  Thoracic spine MRIon 05/03/2018 revealed T4 metastasiswith a 40% pathologic compression deformity and 5 mm of retropulsion of the vertebral body. Retropulsion results in mild spinal canal stenosis and mild bilateral C4-5 foraminal stenosis. There was paravertebral soft tissue thickening from mid T3 to mid T5, likely representing edema related to the pathologic compression deformity vs. possible extraosseous extension of the neoplasm. There were no additional thoracic spinal metastases noted.  05/03/2018:  Head MRIrevealed no intracranial metastatic disease. 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 Incidental mention made of mild paranasal sinus disease. 01/10/2019:  Cervical and thoracic spine CT at [facility] unchanged severe compression deformity/vertebral plana of T4 with a stable degree of retropulsion and focal mild spinal canal stenosis. There was interval enlargement of a right lower lobe pulmonary mass(4.5 x 3.5 cm compared to 3.9 x 2.9 cm)with redemonstrated spiculated pulmonary nodules. 01/28/2019:  Chest, abdomen, pelvisCTrevealed  slight interval enlargement of a right lower lobe mass with central necrosis measuring 4.6 x 3.4 cm, previously 4.0 x 3.0 cm. There was no change in right upper lobe nodules(1.4 and 0.8 cm).Therewas almost no residua of left lung nodules, with irregular opacities in the apical left upper lobeandsuperior segment left lower lobe. There  was no change in right hilar soft tissue and lymph nodes.There wasvertebra plana deformity of T4andno evidence of new osseous metastatic disease.There was no evidence of distant metastatic disease in the abdomen or pelvis.The1.6 x 1.1 cmleft adrenal nodule(non-metabolic on prior PET scan),was unchanged. 03/24/2019:  Renal ultrasoundrevealed no acute abnormality identified.There was no hydronephrosis or bladder distention. 04/23/2019:  Abdomen and pelvis CT revealed no acute abdominal or pelvic pathology. There was fluid in the colon which can be seen with diarrhea. There was diverticulosis without evidence of diverticulitis. There was a stable 1.5 cm left adrenal nodule. 05/06/2019:  Chest, abdomen, and pelvisCTrevealed a positive response to interval therapy for the known right lower lobe lung carcinoma(4.6 x 3.4 cm to3.1 x 2.8 cm transversely). There hadbeen a mild interval decrease contiguous soft tissue that extends along the right hilum. The two right upper lobe noduleswerestable from the most recent prior study (smaller than 04/27/2018).There are no new lung nodules.Therewasno evidence of new metastatic disease.There was stable severe compression deformity/vertebra plana of T4.Therewasno evidence of other osseous metastatic disease.There was a stable left adrenal nodule consistent with an adenoma. 05/30/2019:  ChestCT angiogramrevealed a tiny subsegmental pulmonary embolusin the right lower lobe. There areas of ground-glass at the periphery of the right chest hadbecome more conspicuous. This mayrelate to mild pneumonitis, perhaps  due to radiation, attention on follow-up.The dominant nodule in the right lung basewasstable in size.  08/14/2019:  Cervical spine MRIrevealed no evidence of metastatic disease within the cervical spine. 09/16/2019:  Chest, abdomen and pelvis CTrevealed asimilar-appearing spiculated right lower lobe nodule.There was no evidence for metastatic disease in the abdomen or pelvis.There was stable left adrenal adenoma. 12/15/2019:  Chest CT revealed an enlarging right lower lobe mass c/w malignancy (3.0 x 2.4 cm to 3.3 x 2.7 cm).  There was slight enlargement in a spiculated right upper lobe subpleural nodule (0.8 x 1.5 cm to 1.2 x 0.7 cm). 01/16/2020:  Bone scan revealed no scintigraphic evidence of osseous metastatic disease. 03/21/2020:  Chest CT angiogram revealed no evidence of pulmonary embolism.  A rounded spiculated mass in the RUL measuring 3.4 x 3 cm which was slightly enlarged with adjacent pleural thickening.  There was a stable 1.4 x 0.6 cm irregular subpleural density in the RUL.  There was stable severe compression deformity in the upper thoracic vertebral body consistent with an old fracture. 06/26/2020:  Head CT with or without contrast revealed no evidence of intracranial metastasis. 06/26/2020:  Chest, abdomen, and pelvis CT with contrast revealed a 3.1 x 2.4 cm (previously 3.4 x 3.0 cm) spiculated mass in the superior segment of the RLL.  There was a stable 1.4 x 0.7 cm spiculated subpleural nodule in the posterolateral RUL.  There was a stable high grade vertebral plana deformity of the T4 vertebral body.  There was no evidence of metastatic disease in the abdomen or pelvis.  There was mild stable splenomegaly (13.6 cm). 06/26/2020:  Bone scan revealed no evidence of metastatic disease.   No visits with results within 3 Day(s) from this visit.  Latest known visit with results is:  Hospital Outpatient Visit on 05/09/2020  Component Date Value Ref Range Status  . WBC 05/09/2020  11.5* 4.0 - 10.5 K/uL Final  . RBC 05/09/2020 3.43* 4.22 - 5.81 MIL/uL Final  . Hemoglobin 05/09/2020 10.0* 13.0 - 17.0 g/dL Final  . HCT 05/09/2020 31.8* 39 - 52 % Final  . MCV 05/09/2020 92.7  80.0 - 100.0 fL Final  . MCH 05/09/2020 29.2  26.0 - 34.0 pg Final  . MCHC 05/09/2020 31.4  30.0 - 36.0 g/dL Final  . RDW 05/09/2020 17.4* 11.5 - 15.5 % Final  . Platelets 05/09/2020 287  150 - 400 K/uL Final  . nRBC 05/09/2020 0.0  0.0 - 0.2 % Final   Performed at [facility], [address], [place], [place] [address]  . Prothrombin Time 05/09/2020 12.7  11.4 - 15.2 seconds Final  . INR 05/09/2020 1.0  0.8 - 1.2 Final   Comment: (NOTE) INR goal varies based on device and disease states. Performed at [facility], [address] [place], [place] [address]   . SURGICAL PATHOLOGY 05/09/2020    Final-Edited                   Value:SURGICAL PATHOLOGY CASE: [id number] PATIENT: Shirley Muscat Surgical Pathology Report  Specimen Submitted: A. Right lower lobe lung mass biopsy  Clinical History: History of lung cancer, now with enlarging right lower lobe pulmonary nodule, post CT guided bx.  DIAGNOSIS: A. LUNG, RIGHT LOWER LOBE; CT-GUIDED CORE NEEDLE BIOPSY: - POSITIVE FOR MALIGNANCY. - NON-SMALL CELL CARCINOMA, IN A BACKGROUND OF SIGNIFICANT NECROSIS, SEE COMMENT.  Comment: Immunohistochemical studies show strong staining for CK7, both in viable tumor cells and necrotic debris. There is patchy marking for p40 and p63. TTF-1 and Napsin-A are negative.  The patient's history of pulmonary adenocarcinoma ([id number]) is noted, and this case is reviewed in conjunction with the previous material. The previous case showed positive staining for TTF-1 and Napsin-A, and histologic evaluation showed tumor cells with intracytoplasmic vacuoles, all features compatible with adenocarcinoma.                          These histologic features are not seen in the current case,  and the tumor present in the current sample is negative for TTF-1 and Napsin-A. The significance of these changes is unclear. This may represent potential transformation to a carcinoma with squamous differentiation, or the original biopsy could have represented the glandular component of an adenosquamous carcinoma. Clinical correlation is recommended.  There is sufficient tissue present for ancillary testing in blocks A1 and A3, with limited tissue in block A2.  IHC slides were prepared by [facility], [place], [place]. All controls stained appropriately.  This test was developed and its performance characteristics determined by [facility]. It has not been cleared or approved by the [place] Food and Drug Administration. The FDA does not require this test to go through premarket FDA review. This test is used for clinical purposes. It should not be regarded as investigational or for resea                         rch. This laboratory is certified under the Clinical Laboratory Improvement Amendments (CLIA) as qualified to perform high complexity clinical laboratory testing.  GROSS DESCRIPTION: A. Labeled: Right lower lobe lung mass Received: In formalin Tissue fragment(s): 3 Size: From 1.2 x 1.5 cm in length by 0.1 cm in diameter Description: Pink-red to tan cylindrically shaped tissue fragments Entirely submitted in 3 cassettes.  Final Diagnosis performed by Allena Napoleon, MD.   Electronically signed 05/16/2020 2:48:57PM The electronic signature indicates that the named Attending Pathologist has evaluated the specimen Technical component performed at [facility], [address], [place],  [address] Lab: [phone] Dir: Rush Farmer, MD, MMM  Professional component performed at [facility], [facility]  Center, [place], [place], [place] [address] Lab: [phone] Dir: Dellia Nims. Reuel Derby, MD    Assessment:  Jimmy West is a 69 y.o.  male with metastatichigh-grade adenocarcinomaof the right lungs/p CT-guided biopsy of a RLL lung mass on 05/11/2018. Pathologyrevealed an invasive high-grade adenocarcinoma, with predominantly solid growth pattern. The neoplastic cells were TTF-1 (+), Napsin A (+), and P40 (+). He has a T4 vertebral metastasis. Clinical stage is T4N1M1.  There was not enough material for Foundation One testing. PDL-1revealed TPS 90%.  PET scanon 04/30/2018 revealed a 3.4 cm hypermetabolic RLL pulmonary mass (SUV 13.2), 11 mm pulmonary nodule in the LEFT lung (SUV 4.5), RIGHT paratracheal lymph node (SUV 5.1), and hypermetabolic activity within the T4 vertebral body (SUV 10.2).   Hereceived 11cycles ofpembrolizumab(05/24/2018 - 02/07/2019). He toleratedtreatment well. CEAwas 1.3 on 08/30/2018.LDHwas 165 on 11/29/2018.  He completed T4 radiationon 07/07/2018. He receives Xgevamonthly (06/03/2018 -03/12/2020).  He received 1 cycle of carboplatin, Alimta, and pembrolizumabon 02/08/2019. Cycle #1 was complicated by nausea, vomiting, and diarrhea necessitating hospitalization.Decision made to hold carboplatin with his second dose.He iss/p2cycles ofAlimta and pembrolizumab (02/28/2019 - 03/29/2019).Cycle #2 was complicated by nausea, vomiting, and dehydration requring fluids in clinic and an overnight stay in the hospital. Hereceivedcarboplatin (AUC 2), Alimta, and pebrolizumab(04/18/2019).He tolerated it poorly.   Hehasreceived18 cycles ofAlimta and pembrolizumabon (05/09/2019 -04/11/2020). He began Fulphila with cycle #14 secondary to neutropenia.  He has tolerated chemotherapy well. He receives B12every 9 weeks (last11/10/2019).  Chest CT angiogram on 03/21/2020 revealed no evidence of pulmonary embolism.  A rounded spiculated 3.4 x 3 cm mass in the RUL was slightly enlarged with adjacent pleural thickening.  There was a stable 1.4 x 0.6 cm irregular subpleural  density in the RUL.  There was stable severe compression deformity in the upper thoracic vertebral body consistent with an old fracture.  CT guided right lower lobe lung biopsy on 05/09/2020 revealed non-small cell carcinoma in a background of significant necrosis. Immunohistochemical studies showed strong staining for CK7, both in viable tumor cells and necrotic debris. There was patchy marking for p40 and p63. TTF-1 and Napsin-A were negative.  This may represent potential transformation to a carcinoma with squamous differentiation or the original biopsy could have represented the glandular component of an adenosquamous carcinoma.  Foundation One on 05/09/2020 revealed no reportable alterations.  Microsatellite status was stable, tumor mutational burden 5 Muts/MB, CDKN2A loss, CDKN2B loss, KRAS G12D, MTAP loss, NF2 E442*, STK11 P203 fs*84 and TP53 Y205S.  There were no reportable alterations in ALK, BRAF, EGFR, ERBB2, MET, RET, and ROS1.  Head CT on 06/26/2020 revealed no evidence of intracranial metastasis.  Chest, abdomen, and pelvis CT on 06/26/2020 revealed a 3.1 x 2.4 cm (previously 3.4 x 3.0 cm) spiculated mass in the superior segment of the RLL.  There was a stable 1.4 x 0.7 cm spiculated subpleural nodule in the posterolateral RUL.  There was a stable high grade vertebral plana deformity of the T4 vertebral body.  There was no evidence of metastatic disease in the abdomen or pelvis.  There was mild stable splenomegaly (13.6 cm).  Bone scan on 06/26/2020 revealed no evidence of metastatic disease.  He hascancer-related painin T4. He is off the [employer].  He is taking oxycodone10-78m every 4 hours prn.  He has cervicalgia.Cervical spine MRIon 08/14/2019 showed no evidence of metastatic disease within the cervical spine. Heis followedby Dr YIzora Ribas  He has chemotherapy induced anemia.  He received Retacrit on 11/01/2019 (last 04/09/2020).  Hehad transient  renal  insufficiencyon 03/23/2019. Creatinine was 1.32 (baseline 0.61-1.0). Renal ultrasoundon 03/24/2019 revealed no acute abnormality identified.There was no hydronephrosis or bladder distention.Creatinine is 0.82 today.  Stool waspositive for C difficile + diarrheaon 06/14/2019.Hewas treated with oral vancomycin.  He received the high dose influenza vaccine on 06/11/2020  Symptomatically, he feels "good" after taking steroids yesterday (pre-med).  Exam is stable.  Plan: 1.   Labs: CBC with diff, CMP, Mg 2. Metastatic high-grade adenocarcinoma the RIGHT lung He is s/p 11 cycles of pembrolizumab. He is s/p 1 cycle of carboplatin, Almita, and pembrolizumab (02/08/2019). He is s/p 1 cycle of carboplatin (AUC 2), Alimta, and pembrolizumab (04/18/2019). He is s/p18cycles of Alimta and pembrolizumab (02/28/2019 - 03/29/2019; 05/09/2019-04/11/2020). Chest CT angiogram on 09/01/2021revealed slight growth in the RUL mass.  CT guided RLL lung biopsy on 05/09/2020 revealed potential transformation to squamous differentiation.   Foundation One revealed no driver mutations.     He is intolerant to carboplatin at an AUC of 5 as well as 2.  Review restaging studies.  Agree with radiology interpretation.   Discuss with radiology.   No evidence of tumor progression.  Discuss concern for symptomatic bone lesions with negative bone scan.   Patient may have only PET positive lesions.   Discuss obtaining a PET scan prior to initiation of further chemotherapy.  If PET scan reveals no evidence of progressive disease discuss continuation of Alimta and pembrolizumab as patient tolerating well.  If disease progression documented now or in the future, will require switch to Taxotere secondary to squamous cell differentiation.  Discuss symptom management.  He has antiemetics and pain medications at home to use on a prn bases.  Interventions are adequate.       3. Bone  metastasis Symptomatically, pain appears better controlled today.  Bone scan on 01/16/2020 revealed no evidence of active metastatic disease.  Bone scan on 06/26/2020 revealed no evidence of metastatic disease.   Follow-up PET scan.  He last received Xgeva on 03/12/2020.  If aggressive bone metastasis documented, will reinstitute Xgeva. 4.Anxiety and depression Clinically, he has chronic anxiety and depression.   He is on low-dose Xanax as well as Prozac.  He does not wish to be seen by psychiatry. 5.Pulmonary embolism Continue Eliquis. 6.   No chemotherapy today. 7.   PET scan ASAP. 8.   MD or RN to call patient with PET scan results. 9.   RTC after 3 days after PET scan for MD assessment, labs (CBC with diff, CMP, Mg, TSH) and either Taxotere OR Alimta and pembrolizumab.  I discussed the assessment and treatment plan with the patient.  The patient was provided an opportunity to ask questions and all were answered.  The patient agreed with the plan and demonstrated an understanding of the instructions.  The patient was advised to call back if the symptoms worsen or if the condition fails to improve as anticipated.   I provided 12 minutes of face-to-face time during this this encounter and > 50% was spent counseling as documented under my assessment and plan.  An additional 10 minutes were spent reviewing his chart (Epic and Care Everywhere) including notes, labs, and imaging studies.    Lequita Asal, MD, PhD 06/27/2020; 10:42 PM   I, Mirian Mo Tufford, am acting as a Education administrator for [employer]. Mike Gip, MD.   I, Khadijatou Borak C. Mike Gip, MD, have reviewed the above documentation for accuracy and completeness, and I agree with the above.

## 2020-06-27 ENCOUNTER — Inpatient Hospital Stay (HOSPITAL_BASED_OUTPATIENT_CLINIC_OR_DEPARTMENT_OTHER): Payer: Medicare Other | Admitting: Hematology and Oncology

## 2020-06-27 ENCOUNTER — Inpatient Hospital Stay: Payer: Medicare Other

## 2020-06-27 ENCOUNTER — Encounter: Payer: Self-pay | Admitting: Hematology and Oncology

## 2020-06-27 ENCOUNTER — Other Ambulatory Visit: Payer: Self-pay

## 2020-06-27 VITALS — BP 108/67 | HR 56 | Temp 97.3°F | Resp 18 | Wt 133.5 lb

## 2020-06-27 DIAGNOSIS — Z7901 Long term (current) use of anticoagulants: Secondary | ICD-10-CM

## 2020-06-27 DIAGNOSIS — D6481 Anemia due to antineoplastic chemotherapy: Secondary | ICD-10-CM | POA: Diagnosis not present

## 2020-06-27 DIAGNOSIS — I2782 Chronic pulmonary embolism: Secondary | ICD-10-CM | POA: Diagnosis not present

## 2020-06-27 DIAGNOSIS — G893 Neoplasm related pain (acute) (chronic): Secondary | ICD-10-CM

## 2020-06-27 DIAGNOSIS — C3431 Malignant neoplasm of lower lobe, right bronchus or lung: Secondary | ICD-10-CM

## 2020-06-27 DIAGNOSIS — Z7189 Other specified counseling: Secondary | ICD-10-CM | POA: Diagnosis not present

## 2020-06-27 DIAGNOSIS — Z923 Personal history of irradiation: Secondary | ICD-10-CM | POA: Diagnosis not present

## 2020-06-27 DIAGNOSIS — Z5112 Encounter for antineoplastic immunotherapy: Secondary | ICD-10-CM | POA: Diagnosis not present

## 2020-06-27 DIAGNOSIS — C7951 Secondary malignant neoplasm of bone: Secondary | ICD-10-CM | POA: Diagnosis not present

## 2020-06-27 DIAGNOSIS — F329 Major depressive disorder, single episode, unspecified: Secondary | ICD-10-CM

## 2020-06-27 LAB — CBC WITH DIFFERENTIAL/PLATELET
Abs Immature Granulocytes: 0.04 10*3/uL (ref 0.00–0.07)
Basophils Absolute: 0 10*3/uL (ref 0.0–0.1)
Basophils Relative: 0 %
Eosinophils Absolute: 0 10*3/uL (ref 0.0–0.5)
Eosinophils Relative: 0 %
HCT: 34 % — ABNORMAL LOW (ref 39.0–52.0)
Hemoglobin: 10.6 g/dL — ABNORMAL LOW (ref 13.0–17.0)
Immature Granulocytes: 0 %
Lymphocytes Relative: 9 %
Lymphs Abs: 1 10*3/uL (ref 0.7–4.0)
MCH: 26.6 pg (ref 26.0–34.0)
MCHC: 31.2 g/dL (ref 30.0–36.0)
MCV: 85.4 fL (ref 80.0–100.0)
Monocytes Absolute: 0.9 10*3/uL (ref 0.1–1.0)
Monocytes Relative: 9 %
Neutro Abs: 8.3 10*3/uL — ABNORMAL HIGH (ref 1.7–7.7)
Neutrophils Relative %: 82 %
Platelets: 307 10*3/uL (ref 150–400)
RBC: 3.98 MIL/uL — ABNORMAL LOW (ref 4.22–5.81)
RDW: 14.1 % (ref 11.5–15.5)
WBC: 10.2 10*3/uL (ref 4.0–10.5)
nRBC: 0 % (ref 0.0–0.2)

## 2020-06-27 LAB — COMPREHENSIVE METABOLIC PANEL
ALT: 18 U/L (ref 0–44)
AST: 19 U/L (ref 15–41)
Albumin: 3.8 g/dL (ref 3.5–5.0)
Alkaline Phosphatase: 70 U/L (ref 38–126)
Anion gap: 9 (ref 5–15)
BUN: 18 mg/dL (ref 8–23)
CO2: 26 mmol/L (ref 22–32)
Calcium: 9.8 mg/dL (ref 8.9–10.3)
Chloride: 103 mmol/L (ref 98–111)
Creatinine, Ser: 1.15 mg/dL (ref 0.61–1.24)
GFR, Estimated: >60 mL/min (ref >60)
Glucose, Bld: 116 mg/dL — ABNORMAL HIGH (ref 70–99)
Potassium: 4.2 mmol/L (ref 3.5–5.1)
Sodium: 138 mmol/L (ref 135–145)
Total Bilirubin: 0.5 mg/dL (ref 0.3–1.2)
Total Protein: 6.7 g/dL (ref 6.5–8.1)

## 2020-06-27 LAB — MAGNESIUM: Magnesium: 1.8 mg/dL (ref 1.7–2.4)

## 2020-06-27 MED ORDER — HEPARIN SOD (PORK) LOCK FLUSH 100 UNIT/ML IV SOLN
500.0000 [IU] | Freq: Once | INTRAVENOUS | Status: AC
  Administered 2020-06-27: 500 [IU] via INTRAVENOUS
  Filled 2020-06-27: qty 5

## 2020-06-27 NOTE — Progress Notes (Signed)
No tx today, see new orders for scans, pt d/ced home, port needle deaccessed

## 2020-07-01 DIAGNOSIS — R519 Headache, unspecified: Secondary | ICD-10-CM | POA: Insufficient documentation

## 2020-07-03 ENCOUNTER — Other Ambulatory Visit: Payer: Self-pay | Admitting: *Deleted

## 2020-07-03 DIAGNOSIS — G893 Neoplasm related pain (acute) (chronic): Secondary | ICD-10-CM

## 2020-07-03 DIAGNOSIS — F419 Anxiety disorder, unspecified: Secondary | ICD-10-CM

## 2020-07-03 MED ORDER — OXYCODONE HCL 20 MG PO TABS: ORAL_TABLET | ORAL | 0 refills | Status: DC

## 2020-07-03 MED ORDER — ALPRAZOLAM 0.5 MG PO TABS: 0.5000 mg | ORAL_TABLET | Freq: Four times a day (QID) | ORAL | 0 refills | Status: DC | PRN

## 2020-07-05 ENCOUNTER — Ambulatory Visit
Admission: RE | Admit: 2020-07-05 | Discharge: 2020-07-05 | Disposition: A | Discharge status: Home / Self Care | Payer: Medicare Other | Source: Ambulatory Visit | Attending: Hematology and Oncology | Admitting: Hematology and Oncology

## 2020-07-05 ENCOUNTER — Other Ambulatory Visit: Payer: Self-pay

## 2020-07-05 DIAGNOSIS — C3431 Malignant neoplasm of lower lobe, right bronchus or lung: Secondary | ICD-10-CM | POA: Diagnosis not present

## 2020-07-05 DIAGNOSIS — G893 Neoplasm related pain (acute) (chronic): Secondary | ICD-10-CM | POA: Insufficient documentation

## 2020-07-05 DIAGNOSIS — C7951 Secondary malignant neoplasm of bone: Secondary | ICD-10-CM | POA: Diagnosis not present

## 2020-07-05 DIAGNOSIS — J432 Centrilobular emphysema: Secondary | ICD-10-CM | POA: Diagnosis not present

## 2020-07-05 DIAGNOSIS — I2510 Atherosclerotic heart disease of native coronary artery without angina pectoris: Secondary | ICD-10-CM | POA: Diagnosis not present

## 2020-07-05 DIAGNOSIS — C3490 Malignant neoplasm of unspecified part of unspecified bronchus or lung: Secondary | ICD-10-CM | POA: Diagnosis not present

## 2020-07-05 DIAGNOSIS — I700 Atherosclerosis of aorta: Secondary | ICD-10-CM | POA: Diagnosis not present

## 2020-07-05 LAB — GLUCOSE, CAPILLARY: Glucose-Capillary: 91 mg/dL (ref 70–99)

## 2020-07-05 MED ORDER — FLUDEOXYGLUCOSE F - 18 (FDG) INJECTION
6.8580 | Freq: Once | INTRAVENOUS | Status: AC | PRN
  Administered 2020-07-05: 6.858 via INTRAVENOUS

## 2020-07-09 NOTE — Progress Notes (Signed)
[facility]  [address], [address] [place], [place] [address] Phone: [phone]  Fax: [phone]   Clinic Day: 07/10/20  Referring physician: [name], NP  Chief Complaint: Jimmy West is a 69 y.o. male with metastatic adenocarcinoma of the lung who is seen for review of PET scan and discussion regarding direction of therapy.  HPI: The patient was last seen in the medical oncology clinic on 06/27/2020. At that time, he felt "good" after taking steroids the day before (pre-med).  Exam was stable. Hematocrit was 34.0, hemoglobin 10.6, MCV 85.4, platelets 307,000, WBC 10,200. Imaging studies revealed no evidence of bone metastasis.  CT scans suggested slight improvement in the 3.1 cm RLL mass and stability in the subpleural 1.4 cm RUL mass.  Concern was raised for possible PET + lesions not seen on bone scan given his symptoms.  Given the plan to possibly change his chemotherapy, a PET scan was ordered.  PET scan on 07/05/2020 revealed a 3.1 x 2.5 cm mass within superior segment of the RLL with intense FDG uptake (SUV 6.19) c/w metabolically active tumor. Additionally, there were 2 small nodules within the posterior RUL which were FDG avid (1 cm ; SUV 3.84) concerning for additional sites of disease. There was a geographic, curvilinear bandlike area of increased uptake within the posterior left lung base that corresponded to an area of airspace and ground-glass attenuation. The geographic distribution of this finding favored set inflammatory or infectious process.  During the interim, he has been "good." He denies any bone pain. He takes about 4 oxycodone daily and does not use the Fentanyl patch. His appetite fluctuates. His shortness of breath has improved. His back pain comes and goes. He had one day of pain under his right arm last week. His mood has been a little bit better lately. His headaches, sinus issues, shoulder pain, and side pain have  resolved.  The patient has been taking folic acid.  The patient would like to continue receiving Alimta and pembrolizumab and switch to Taxotere when there is progression documented on scan. He agrees to receive treatment tomorrow.   Past Medical History:  Diagnosis Date  . Bulging lumbar disc   . Cancer ([facility])    stage 4 lung cancer  . Heart attack [facility])     Past Surgical History:  Procedure Laterality Date  . CARDIAC CATHETERIZATION     two stents  . KNEE SURGERY Left   . PORTA CATH INSERTION N/A 05/21/2018   Procedure: PORTA CATH INSERTION;  Surgeon: Algernon Huxley, MD;  Location: [facility];  Service: Cardiovascular;  Laterality: N/A;    Family History  Problem Relation Age of Onset  . Heart failure Father   . Cancer Maternal Aunt   . Heart failure Maternal Uncle   . Dementia Paternal Grandmother   . Cancer Maternal Aunt     Social History:  reports that he quit smoking about 16 years ago. His smoking use included cigarettes. He has a 22.50 pack-year smoking history. He has never used smokeless tobacco. He reports previous alcohol use. He reports that he does not use drugs. He started smoking at age 89. He is smoking 1 1/2 packs/day. Patient denies known exposures to radiation ortoxins. 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 He has not been working recently as the salon was closed. He plays golf occasionally (none recently).He has 8 cats. The patient is alone today.   Allergies: No  Known Allergies  Current Medications: Current Outpatient Medications  Medication Sig Dispense Refill  . albuterol (VENTOLIN HFA) 108 (90 Base) MCG/ACT inhaler Inhale 2 puffs into the lungs every 6 (six) hours as needed for wheezing or shortness of breath. 18 g 3  . ALPRAZolam (XANAX) 0.5 MG tablet Take 1 tablet (0.5 mg total) by mouth 4 (four) times daily as needed for anxiety. 60 tablet 0  . Calcium 600-400 MG-UNIT CHEW Chew 2 tablets by mouth daily.     Marland Kitchen dexamethasone (DECADRON) 4 MG tablet Take 2 tablets (8 mg total) by mouth 2 (two) times daily. Start the day before Taxotere. Then daily after chemo for 2 days. 30 tablet 1  . ELIQUIS 5 MG TABS tablet TAKE 1 TABLET BY MOUTH TWICE DAILY 60 tablet 5  . FLUoxetine (PROZAC) 40 MG capsule Take 1 capsule (40 mg total) by mouth daily. 30 capsule 3  . folic acid (FOLVITE) 1 MG tablet Take 1 tablet (1 mg total) by mouth daily. Continue until 21 days after Alimta completed.    . ondansetron (ZOFRAN) 8 MG tablet Take 1 tablet (8 mg total) by mouth every 8 (eight) hours as needed for refractory nausea / vomiting. 30 tablet 1  . ondansetron (ZOFRAN-ODT) 8 MG disintegrating tablet DISSOLVE 1 TABLET ON TONGUE EVERY 8 HOURS AS NEEDED FOR NAUSEA OR VOMITING 20 tablet 1  . Oxycodone HCl 20 MG TABS TAKE 1/2 TO 1 TABLET BY MOUTH EVERY 4 HOURS AS NEEDED FOR SEVERE PAIN 60 tablet 0  . polyethylene glycol (MIRALAX / GLYCOLAX) packet Take 17 g by mouth daily.    Marland Kitchen senna (SENOKOT) 8.6 MG tablet Take 1 tablet by mouth as needed.     Marland Kitchen acetaminophen (TYLENOL) 500 MG tablet Take 500 mg by mouth 3 (three) times daily as needed.  (Patient not taking: No sig reported)     No current facility-administered medications for this visit.   Facility-Administered Medications Ordered in Other Visits  Medication Dose Route Frequency Provider Last Rate Last Admin  . 0.9 %  sodium chloride infusion   Intravenous Once Aviendha Azbell C, MD      . 0.9 %  sodium chloride infusion   Intravenous Once PRN Everardo Voris C, MD      . albuterol (PROVENTIL) (2.5 MG/3ML) 0.083% nebulizer solution 2.5 mg  2.5 mg Nebulization Once PRN Sanora Cunanan C, MD      . alteplase (CATHFLO ACTIVASE) injection 2 mg  2 mg Intracatheter Once PRN Lequita Asal, MD      . EPINEPHrine (ADRENALIN) 1 MG/10ML injection 0.25 mg  0.25 mg Intravenous Once PRN Lanijah Warzecha C, MD      . EPINEPHrine (ADRENALIN) 1 MG/10ML injection 0.25 mg  0.25 mg  Intravenous Once PRN Alyric Parkin C, MD      . heparin lock flush 100 unit/mL  500 Units Intracatheter Once PRN Mike Gip, Ayano Douthitt C, MD      . heparin lock flush 100 unit/mL  250 Units Intracatheter Once PRN Nolon Stalls C, MD      . sodium chloride flush (NS) 0.9 % injection 10 mL  10 mL Intravenous PRN Nolon Stalls C, MD   10 mL at 03/04/19 0959  . sodium chloride flush (NS) 0.9 % injection 10 mL  10 mL Intracatheter Once PRN Marcelo Ickes C, MD      . sodium chloride flush (NS) 0.9 % injection 3 mL  3 mL Intracatheter Once PRN Lequita Asal, MD  Review of Systems  Constitutional: Positive for weight loss (4 lbs). Negative for chills, diaphoresis, fever and malaise/fatigue.       Feels "good".  HENT: Negative.  Negative for congestion, ear discharge, ear pain, hearing loss, nosebleeds, sinus pain, sore throat and tinnitus.   Eyes: Negative.  Negative for blurred vision and double vision.  Respiratory: Positive for shortness of breath (on exertion, improved). Negative for cough, hemoptysis and sputum production.   Cardiovascular: Negative.  Negative for chest pain, palpitations, orthopnea and leg swelling.  Gastrointestinal: Negative for abdominal pain, blood in stool, constipation, diarrhea, heartburn, melena, nausea and vomiting.       Appetite fluctuates.  Genitourinary: Negative.  Negative for dysuria, frequency, hematuria and urgency.  Musculoskeletal: Positive for back pain (entire spine,comes and goes). Negative for falls, joint pain, myalgias and neck pain.       Pain under right arm, resolved.  Skin: Negative.  Negative for itching and rash.  Neurological: Negative.  Negative for dizziness, tingling, sensory change, focal weakness, weakness and headaches.  Endo/Heme/Allergies: Negative.  Negative for environmental allergies and polydipsia. Does not bruise/bleed easily.  Psychiatric/Behavioral: Positive for depression (mood has been a little better  lately). Negative for memory loss. The patient is nervous/anxious (on Xanax). The patient does not have insomnia (on OTC medication).   All other systems reviewed and are negative.  Performance status (ECOG): 1  Vitals Blood pressure 122/66, pulse 75, temperature 97.8 F (36.6 C), temperature source Tympanic, weight 129 lb 10.1 oz (58.8 kg), SpO2 100 %.   Physical Exam Vitals and nursing note reviewed.  Constitutional:      General: He is not in acute distress.    Appearance: He is well-developed. He is not diaphoretic.     Interventions: Face mask in place.  HENT:     Head: Normocephalic and atraumatic.     Comments: Thin brown graying hair. Mustache.    Mouth/Throat:     Mouth: Mucous membranes are moist.     Pharynx: Oropharynx is clear.  Eyes:     General: No scleral icterus.    Extraocular Movements: Extraocular movements intact.     Conjunctiva/sclera: Conjunctivae normal.     Pupils: Pupils are equal, round, and reactive to light.     Comments: Glasses.  Gray/blue eyes.  Cardiovascular:     Rate and Rhythm: Normal rate and regular rhythm.     Heart sounds: Normal heart sounds. No murmur heard.   Pulmonary:     Effort: Pulmonary effort is normal. No respiratory distress.     Breath sounds: Normal breath sounds. No wheezing or rales.  Chest:     Chest wall: No tenderness.  Breasts:     Right: No axillary adenopathy or supraclavicular adenopathy.     Left: No axillary adenopathy or supraclavicular adenopathy.    Abdominal:     General: Bowel sounds are normal. There is no distension.     Palpations: Abdomen is soft. There is no mass.     Tenderness: There is no abdominal tenderness. There is no guarding or rebound.  Musculoskeletal:        General: Tenderness (slight at T4) present. No swelling. Normal range of motion.     Cervical back: Normal range of motion and neck supple.  Lymphadenopathy:     Head:     Right side of head: No preauricular, posterior  auricular or occipital adenopathy.     Left side of head: No preauricular, posterior auricular or occipital adenopathy.  Cervical: No cervical adenopathy.     Upper Body:     Right upper body: No supraclavicular or axillary adenopathy.     Left upper body: No supraclavicular or axillary adenopathy.     Lower Body: No right inguinal adenopathy. No left inguinal adenopathy.  Skin:    General: Skin is warm and dry.  Neurological:     Mental Status: He is alert and oriented to person, place, and time. Mental status is at baseline.  Psychiatric:        Behavior: Behavior normal.        Thought Content: Thought content normal.        Judgment: Judgment normal.    Imaging studies: 04/30/2018:  PET scanrevealed a 3.4 cm hypermetabolic RLL pulmonary mass (SUV 13.2), 11 mm pulmonary nodule in the LEFT lung (SUV 4.5), RIGHT paratracheal lymph node (SUV 5.1), and hypermetabolic activity within the T4 vertebral body (SUV 10.2).  05/03/2018:  Thoracic spine MRIon 05/03/2018 revealed T4 metastasiswith a 40% pathologic compression deformity and 5 mm of retropulsion of the vertebral body. Retropulsion results in mild spinal canal stenosis and mild bilateral C4-5 foraminal stenosis. There was paravertebral soft tissue thickening from mid T3 to mid T5, likely representing edema related to the pathologic compression deformity vs. possible extraosseous extension of the neoplasm. There were no additional thoracic spinal metastases noted.  05/03/2018:  Head MRIrevealed no intracranial metastatic disease. 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 Incidental mention made of mild paranasal sinus disease. 01/10/2019:  Cervical and thoracic spine CT [facility] unchanged severe compression deformity/vertebral plana of T4 with a stable  degree of retropulsion and focal mild spinal canal stenosis. There was interval enlargement of a right lower lobe pulmonary mass(4.5 x 3.5 cm compared to 3.9 x 2.9 cm)with redemonstrated spiculated pulmonary nodules. 01/28/2019:  Chest, abdomen, pelvisCTrevealed slight interval enlargement of a right lower lobe mass with central necrosis measuring 4.6 x 3.4 cm, previously 4.0 x 3.0 cm. There was no change in right upper lobe nodules(1.4 and 0.8 cm).Therewas almost no residua of left lung nodules, with irregular opacities in the apical left upper lobeandsuperior segment left lower lobe. There was no change in right hilar soft tissue and lymph nodes.There wasvertebra plana deformity of T4andno evidence of new osseous metastatic disease.There was no evidence of distant metastatic disease in the abdomen or pelvis.The1.6 x 1.1 cmleft adrenal nodule(non-metabolic on prior PET scan),was unchanged. 03/24/2019:  Renal ultrasoundrevealed no acute abnormality identified.There was no hydronephrosis or bladder distention. 04/23/2019:  Abdomen and pelvis CT revealed no acute abdominal or pelvic pathology. There was fluid in the colon which can be seen with diarrhea. There was diverticulosis without evidence of diverticulitis. There was a stable 1.5 cm left adrenal nodule. 05/06/2019:  Chest, abdomen, and pelvisCTrevealed a positive response to interval therapy for the known right lower lobe lung carcinoma(4.6 x 3.4 cm to3.1 x 2.8 cm transversely). There hadbeen a mild interval decrease contiguous soft tissue that extends along the right hilum. The two right upper lobe noduleswerestable from the most recent prior study (smaller than 04/27/2018).There are no new lung nodules.Therewasno evidence of new metastatic disease.There was stable severe compression deformity/vertebra plana of T4.Therewasno evidence of other osseous metastatic disease.There was a stable left adrenal nodule  consistent with an adenoma. 05/30/2019:  ChestCT angiogramrevealed a tiny subsegmental pulmonary embolusin the right lower lobe.  There areas of ground-glass at the periphery of the right chest hadbecome more conspicuous. This mayrelate to mild pneumonitis, perhaps due to radiation, attention on follow-up.The dominant nodule in the right lung basewasstable in size.  08/14/2019:  Cervical spine MRIrevealed no evidence of metastatic disease within the cervical spine. 09/16/2019:  Chest, abdomen and pelvis CTrevealed asimilar-appearing spiculated right lower lobe nodule.There was no evidence for metastatic disease in the abdomen or pelvis.There was stable left adrenal adenoma. 12/15/2019:  Chest CT revealed an enlarging right lower lobe mass c/w malignancy (3.0 x 2.4 cm to 3.3 x 2.7 cm).  There was slight enlargement in a spiculated right upper lobe subpleural nodule (0.8 x 1.5 cm to 1.2 x 0.7 cm). 01/16/2020:  Bone scan revealed no scintigraphic evidence of osseous metastatic disease. 03/21/2020:  Chest CT angiogram revealed no evidence of pulmonary embolism.  A rounded spiculated mass in the RUL measuring 3.4 x 3 cm which was slightly enlarged with adjacent pleural thickening.  There was a stable 1.4 x 0.6 cm irregular subpleural density in the RUL.  There was stable severe compression deformity in the upper thoracic vertebral body consistent with an old fracture. 06/26/2020:  Head CT with or without contrast revealed no evidence of intracranial metastasis. 06/26/2020:  Chest, abdomen, and pelvis CT with contrast revealed a 3.1 x 2.4 cm (previously 3.4 x 3.0 cm) spiculated mass in the superior segment of the RLL.  There was a stable 1.4 x 0.7 cm spiculated subpleural nodule in the posterolateral RUL.  There was a stable high grade vertebral plana deformity of the T4 vertebral body.  There was no evidence of metastatic disease in the abdomen or pelvis.  There was mild stable splenomegaly (13.6  cm). 06/26/2020:  Bone scan revealed no evidence of metastatic disease. 07/05/2020:  PET scan revealed a 3.1 x 2.5 cm mass within superior segment of the RLL with intense FDG uptake (SUV 8.24) c/w metabolically active tumor. Additionally, there were 2 small nodules within the posterior RUL which were FDG avid (1 cm ; SUV 3.84) concerning for additional sites of disease. There was a geographic, curvilinear bandlike area of increased uptake within the posterior left lung base that corresponded to an area of airspace and ground-glass attenuation. The geographic distribution of this finding favored set inflammatory or infectious process.   No visits with results within 3 Day(s) from this visit.  Latest known visit with results is:  Hospital Outpatient Visit on 05/09/2020  Component Date Value Ref Range Status  . WBC 05/09/2020 11.5* 4.0 - 10.5 K/uL Final  . RBC 05/09/2020 3.43* 4.22 - 5.81 MIL/uL Final  . Hemoglobin 05/09/2020 10.0* 13.0 - 17.0 g/dL Final  . HCT 05/09/2020 31.8* 39 - 52 % Final  . MCV 05/09/2020 92.7  80.0 - 100.0 fL Final  . MCH 05/09/2020 29.2  26.0 - 34.0 pg Final  . MCHC 05/09/2020 31.4  30.0 - 36.0 g/dL Final  . RDW 05/09/2020 17.4* 11.5 - 15.5 % Final  . Platelets 05/09/2020 287  150 - 400 K/uL Final  . nRBC 05/09/2020 0.0  0.0 - 0.2 % Final   Performed at [facility], [address], [place], [place] [address]  . Prothrombin Time 05/09/2020 12.7  11.4 - 15.2 seconds Final  . INR 05/09/2020 1.0  0.8 - 1.2 Final   Comment: (NOTE) INR goal varies based on device and disease states. Performed at [facility], [address] [place], [place] [address]   . SURGICAL PATHOLOGY 05/09/2020  Final-Edited                   Value:SURGICAL PATHOLOGY CASE: [id number] PATIENT: Jimmy West Surgical Pathology Report  Specimen Submitted: A. Right lower lobe lung mass biopsy  Clinical History: History of lung cancer, now with enlarging right  lower lobe pulmonary nodule, post CT guided bx.  DIAGNOSIS: A. LUNG, RIGHT LOWER LOBE; CT-GUIDED CORE NEEDLE BIOPSY: - POSITIVE FOR MALIGNANCY. - NON-SMALL CELL CARCINOMA, IN A BACKGROUND OF SIGNIFICANT NECROSIS, SEE COMMENT.  Comment: Immunohistochemical studies show strong staining for CK7, both in viable tumor cells and necrotic debris. There is patchy marking for p40 and p63. TTF-1 and Napsin-A are negative.  The patient's history of pulmonary adenocarcinoma ([id number]) is noted, and this case is reviewed in conjunction with the previous material. The previous case showed positive staining for TTF-1 and Napsin-A, and histologic evaluation showed tumor cells with intracytoplasmic vacuoles, all features compatible with adenocarcinoma.                          These histologic features are not seen in the current case, and the tumor present in the current sample is negative for TTF-1 and Napsin-A. The significance of these changes is unclear. This may represent potential transformation to a carcinoma with squamous differentiation, or the original biopsy could have represented the glandular component of an adenosquamous carcinoma. Clinical correlation is recommended.  There is sufficient tissue present for ancillary testing in blocks A1 and A3, with limited tissue in block A2.  IHC slides were prepared by [facility], [place], [place]. All controls stained appropriately.  This test was developed and its performance characteristics determined by [facility]. It has not been cleared or approved by the [place] Food and Drug Administration. The FDA does not require this test to go through premarket FDA review. This test is used for clinical purposes. It should not be regarded as investigational or for resea                         rch. This laboratory is certified under the Clinical Laboratory Improvement Amendments (CLIA) as qualified to perform high  complexity clinical laboratory testing.  GROSS DESCRIPTION: A. Labeled: Right lower lobe lung mass Received: In formalin Tissue fragment(s): 3 Size: From 1.2 x 1.5 cm in length by 0.1 cm in diameter Description: Pink-red to tan cylindrically shaped tissue fragments Entirely submitted in 3 cassettes.  Final Diagnosis performed by Allena Napoleon, MD.   Electronically signed 05/16/2020 2:48:57PM The electronic signature indicates that the named Attending Pathologist has evaluated the specimen Technical component performed at [facility], [address], [place], [place] [address] Lab: [phone] Dir: Rush Farmer, MD, MMM  Professional component performed at [facility], [facility], [place], [place], [place] [address] Lab: [phone] Dir: Dellia Nims. Reuel Derby, MD    Assessment:  Kaspian Muccio is a 69 y.o. male with metastatichigh-grade adenocarcinomaof the right lungs/p CT-guided biopsy of a RLL lung mass on 05/11/2018. Pathologyrevealed an invasive high-grade adenocarcinoma, with predominantly solid growth pattern. The neoplastic cells were TTF-1 (+), Napsin A (+), and P40 (+). He has a T4 vertebral metastasis. Clinical stage is T4N1M1.  There was not enough material for Foundation One testing. PDL-1revealed TPS 90%.  PET scanon 04/30/2018 revealed a 3.4 cm hypermetabolic RLL pulmonary mass (SUV 13.2), 11 mm pulmonary nodule in the LEFT lung (SUV 4.5), RIGHT paratracheal lymph node (SUV  5.1), and hypermetabolic activity within the T4 vertebral body (SUV 10.2).   Hereceived 11cycles ofpembrolizumab(05/24/2018 - 02/07/2019). He toleratedtreatment well. CEAwas 1.3 on 08/30/2018.LDHwas 165 on 11/29/2018.  He completed T4 radiationon 07/07/2018. He receives Xgevamonthly (06/03/2018 -03/12/2020).  He received 1 cycle of carboplatin, Alimta, and pembrolizumabon 02/08/2019. Cycle #1 was complicated by nausea, vomiting, and diarrhea  necessitating hospitalization.Decision made to hold carboplatin with his second dose.He iss/p2cycles ofAlimta and pembrolizumab (02/28/2019 - 03/29/2019).Cycle #2 was complicated by nausea, vomiting, and dehydration requring fluids in clinic and an overnight stay in the hospital. Hereceivedcarboplatin (AUC 2), Alimta, and pebrolizumab(04/18/2019).He tolerated it poorly.   Hehasreceived18 cycles ofAlimta and pembrolizumabon (05/09/2019 -04/11/2020). He began Fulphila with cycle #14 secondary to neutropenia.  He has tolerated chemotherapy well. He receives B12every 9 weeks (last11/10/2019).  Chest CT angiogram on 03/21/2020 revealed no evidence of pulmonary embolism.  A rounded spiculated 3.4 x 3 cm mass in the RUL was slightly enlarged with adjacent pleural thickening.  There was a stable 1.4 x 0.6 cm irregular subpleural density in the RUL.  There was stable severe compression deformity in the upper thoracic vertebral body consistent with an old fracture.  CT guided right lower lobe lung biopsy on 05/09/2020 revealed non-small cell carcinoma in a background of significant necrosis. Immunohistochemical studies showed strong staining for CK7, both in viable tumor cells and necrotic debris. There was patchy marking for p40 and p63. TTF-1 and Napsin-A were negative.  This may represent potential transformation to a carcinoma with squamous differentiation or the original biopsy could have represented the glandular component of an adenosquamous carcinoma.  Foundation One on 05/09/2020 revealed no reportable alterations.  Microsatellite status was stable, tumor mutational burden 5 Muts/MB, CDKN2A loss, CDKN2B loss, KRAS G12D, MTAP loss, NF2 E442*, STK11 P203 fs*84 and TP53 Y205S.  There were no reportable alterations in ALK, BRAF, EGFR, ERBB2, MET, RET, and ROS1.  Head CT on 06/26/2020 revealed no evidence of intracranial metastasis.  Chest, abdomen, and pelvis CT on 06/26/2020  revealed a 3.1 x 2.4 cm (previously 3.4 x 3.0 cm) spiculated mass in the superior segment of the RLL.  There was a stable 1.4 x 0.7 cm spiculated subpleural nodule in the posterolateral RUL.  There was a stable high grade vertebral plana deformity of the T4 vertebral body.  There was no evidence of metastatic disease in the abdomen or pelvis.  There was mild stable splenomegaly (13.6 cm).  Bone scan on 06/26/2020 revealed no evidence of metastatic disease.  PET scan on 07/05/2020 revealed a 3.1 x 2.5 cm mass within superior segment of the RLL with intense FDG uptake (SUV 2.09) c/w metabolically active tumor. Additionally, there were 2 small nodules within the posterior RUL which were FDG avid (1 cm ; SUV 3.84) concerning for additional sites of disease. There was a geographic, curvilinear bandlike area of increased uptake within the posterior left lung base that corresponded to an area of airspace and ground-glass attenuation. The geographic distribution of this finding favored set inflammatory or infectious process.  He hascancer-related painin T4. He is off the [employer].  He is taking oxycodone10-60m every 4 hours prn.  He has cervicalgia.Cervical spine MRIon 08/14/2019 showed no evidence of metastatic disease within the cervical spine. Heis followedby Dr YIzora Ribas  He has chemotherapy induced anemia.  He received Retacrit on 11/01/2019 (last 04/09/2020).  Hehad transient renal insufficiencyon 03/23/2019. Creatinine was 1.32 (baseline 0.61-1.0). Renal ultrasoundon 03/24/2019 revealed no acute abnormality identified.There was no hydronephrosis or bladder distention.Creatinine is 0.82 today.  Stool waspositive for C difficile + diarrheaon 06/14/2019.Hewas treated with oral vancomycin.  He received the high dose influenza vaccine on 06/11/2020  Symptomatically, he has been "good." He denies any bone pain. His appetite fluctuates. His shortness of breath has improved.  His back pain comes and goes. His mood has been a little bit better lately. Exam is unremarkable.  Plan: 1.   Labs: CBC with diff, CMP, Mg, TSH. 2. Metastatic high-grade adenocarcinoma the RIGHT lung He is s/p 11 cycles of pembrolizumab. He is s/p 1 cycle of carboplatin, Almita, and pembrolizumab (02/08/2019). He is s/p 1 cycle of carboplatin (AUC 2), Alimta, and pembrolizumab (04/18/2019). He is s/p18cycles of Alimta and pembrolizumab (02/28/2019 - 03/29/2019; 05/09/2019-04/11/2020). Chest CT angiogram on 09/01/2021revealed slight growth in the RUL mass.  CT guided RLL lung biopsy on 05/09/2020 revealed potential transformation to squamous differentiation.   Foundation One revealed no driver mutations.     He is intolerant to carboplatin at an AUC of 5 as well as 2.  Review PET scan from 07/05/2020.  Agree with radiology interpretation.  Images personally reviewed with radiology.   There is no evidence of disease progression.   PET scan document stable disease as compared to images from 08/2019  Discuss plan for continuation of Alimta and pembrolizumab based on stability of disease and tolerance of treatment.   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   At some point, his disease will progress and require Taxotere.  Patient would like to postpone until necessary.   Review prior cycles of chemotherapy with nadir ANC without growth factor support between 400-500.     Counts with his last 3 cycles with Fulphila support were good.   Plan for continuation of Fulphila.   Patient consents to continuation of Alimta and pembrolizumab.  Discuss symptom management.  He has antiemetics and pain medications at home to use on a prn bases.  Interventions are adequate.       3. Bone metastasis Symptomatically, he denies any bone pain.  Bone scan on 01/16/2020 revealed no evidence of active  metastatic disease.  Bone scan on 06/26/2020 revealed no evidence of metastatic disease.  PET scan on 07/05/2020 revealed no evidence of metastatic disease.  He last received Xgeva on 03/12/2020.  Review plan to reinstitute Xgeva if aggressive bone metastasis documented. 4.Anxiety and depression Clinically, his mood has been better lately.   He is on low-dose Xanax as well as Prozac.  Continue to monitor. 5.Pulmonary embolism Continue Eliquis. 6.   No chemotherapy today. 7.   RTC tomorrow for cycle #19 Alimta and pembrolizumab with Fulphila support on 07/12/2020. 8.   RTC on 07/26/2020 for B12. 9.   RTC in 3 weeks for MD assessment, labs (CBC with diff, CMP, Mg, TSH), and cycle #20 Alimta and pembrolizumab.  I discussed the assessment and treatment plan with the patient.  The patient was provided an opportunity to ask questions and all were answered.  The patient agreed with the plan and demonstrated an understanding of the instructions.  The patient was advised to call back if the symptoms worsen or if the condition fails to improve as anticipated.   Lequita Asal, MD, PhD 07/10/2020; 9:18 AM   I, Mirian Mo Tufford, am acting as a Education administrator for [employer]. Mike Gip, MD.   I, Saba Neuman C. Mike Gip, MD, have reviewed the above documentation for accuracy and completeness, and I agree with the above.

## 2020-07-10 ENCOUNTER — Inpatient Hospital Stay: Payer: Medicare Other

## 2020-07-10 ENCOUNTER — Other Ambulatory Visit: Payer: Self-pay

## 2020-07-10 ENCOUNTER — Inpatient Hospital Stay (HOSPITAL_BASED_OUTPATIENT_CLINIC_OR_DEPARTMENT_OTHER): Payer: Medicare Other | Admitting: Hematology and Oncology

## 2020-07-10 ENCOUNTER — Telehealth: Payer: Self-pay | Admitting: Hematology and Oncology

## 2020-07-10 VITALS — BP 122/66 | HR 75 | Temp 97.8°F | Wt 129.6 lb

## 2020-07-10 DIAGNOSIS — G893 Neoplasm related pain (acute) (chronic): Secondary | ICD-10-CM

## 2020-07-10 DIAGNOSIS — Z5111 Encounter for antineoplastic chemotherapy: Secondary | ICD-10-CM

## 2020-07-10 DIAGNOSIS — Z7189 Other specified counseling: Secondary | ICD-10-CM

## 2020-07-10 DIAGNOSIS — C3431 Malignant neoplasm of lower lobe, right bronchus or lung: Secondary | ICD-10-CM

## 2020-07-10 DIAGNOSIS — D6481 Anemia due to antineoplastic chemotherapy: Secondary | ICD-10-CM | POA: Diagnosis not present

## 2020-07-10 DIAGNOSIS — F329 Major depressive disorder, single episode, unspecified: Secondary | ICD-10-CM | POA: Diagnosis not present

## 2020-07-10 DIAGNOSIS — I2782 Chronic pulmonary embolism: Secondary | ICD-10-CM | POA: Diagnosis not present

## 2020-07-10 DIAGNOSIS — Z923 Personal history of irradiation: Secondary | ICD-10-CM | POA: Diagnosis not present

## 2020-07-10 DIAGNOSIS — Z7901 Long term (current) use of anticoagulants: Secondary | ICD-10-CM

## 2020-07-10 DIAGNOSIS — C7951 Secondary malignant neoplasm of bone: Secondary | ICD-10-CM

## 2020-07-10 DIAGNOSIS — Z5112 Encounter for antineoplastic immunotherapy: Secondary | ICD-10-CM | POA: Diagnosis not present

## 2020-07-10 DIAGNOSIS — Z95828 Presence of other vascular implants and grafts: Secondary | ICD-10-CM

## 2020-07-10 LAB — TSH: TSH: 0.783 u[IU]/mL (ref 0.350–4.500)

## 2020-07-10 LAB — COMPREHENSIVE METABOLIC PANEL
ALT: 25 U/L (ref 0–44)
AST: 28 U/L (ref 15–41)
Albumin: 3.8 g/dL (ref 3.5–5.0)
Alkaline Phosphatase: 74 U/L (ref 38–126)
Anion gap: 10 (ref 5–15)
BUN: 20 mg/dL (ref 8–23)
CO2: 24 mmol/L (ref 22–32)
Calcium: 9.4 mg/dL (ref 8.9–10.3)
Chloride: 101 mmol/L (ref 98–111)
Creatinine, Ser: 1.11 mg/dL (ref 0.61–1.24)
GFR, Estimated: >60 mL/min (ref >60)
Glucose, Bld: 123 mg/dL — ABNORMAL HIGH (ref 70–99)
Potassium: 4.1 mmol/L (ref 3.5–5.1)
Sodium: 135 mmol/L (ref 135–145)
Total Bilirubin: 0.4 mg/dL (ref 0.3–1.2)
Total Protein: 7.2 g/dL (ref 6.5–8.1)

## 2020-07-10 LAB — CBC WITH DIFFERENTIAL/PLATELET
Abs Immature Granulocytes: 0.04 10*3/uL (ref 0.00–0.07)
Basophils Absolute: 0 10*3/uL (ref 0.0–0.1)
Basophils Relative: 0 %
Eosinophils Absolute: 0 10*3/uL (ref 0.0–0.5)
Eosinophils Relative: 0 %
HCT: 33.6 % — ABNORMAL LOW (ref 39.0–52.0)
Hemoglobin: 10.6 g/dL — ABNORMAL LOW (ref 13.0–17.0)
Immature Granulocytes: 0 %
Lymphocytes Relative: 10 %
Lymphs Abs: 0.9 10*3/uL (ref 0.7–4.0)
MCH: 26.4 pg (ref 26.0–34.0)
MCHC: 31.5 g/dL (ref 30.0–36.0)
MCV: 83.8 fL (ref 80.0–100.0)
Monocytes Absolute: 0.8 10*3/uL (ref 0.1–1.0)
Monocytes Relative: 8 %
Neutro Abs: 7.6 10*3/uL (ref 1.7–7.7)
Neutrophils Relative %: 82 %
Platelets: 231 10*3/uL (ref 150–400)
RBC: 4.01 MIL/uL — ABNORMAL LOW (ref 4.22–5.81)
RDW: 14.8 % (ref 11.5–15.5)
WBC: 9.3 10*3/uL (ref 4.0–10.5)
nRBC: 0 % (ref 0.0–0.2)

## 2020-07-10 LAB — MAGNESIUM: Magnesium: 2.1 mg/dL (ref 1.7–2.4)

## 2020-07-10 MED ORDER — HEPARIN SOD (PORK) LOCK FLUSH 100 UNIT/ML IV SOLN
500.0000 [IU] | Freq: Once | INTRAVENOUS | Status: AC
  Administered 2020-07-10: 500 [IU] via INTRAVENOUS
  Filled 2020-07-10: qty 5

## 2020-07-10 MED ORDER — SODIUM CHLORIDE 0.9% FLUSH
10.0000 mL | INTRAVENOUS | Status: DC | PRN
  Administered 2020-07-10: 10 mL via INTRAVENOUS
  Filled 2020-07-10: qty 10

## 2020-07-10 NOTE — Progress Notes (Signed)
Day or two with cough last week. Patient would like to know if/when he can get the covid booster.

## 2020-07-11 ENCOUNTER — Inpatient Hospital Stay: Payer: Medicare Other

## 2020-07-11 VITALS — BP 97/61 | HR 76 | Temp 97.4°F | Resp 18

## 2020-07-11 DIAGNOSIS — C3431 Malignant neoplasm of lower lobe, right bronchus or lung: Secondary | ICD-10-CM

## 2020-07-11 DIAGNOSIS — Z923 Personal history of irradiation: Secondary | ICD-10-CM | POA: Diagnosis not present

## 2020-07-11 DIAGNOSIS — C7951 Secondary malignant neoplasm of bone: Secondary | ICD-10-CM | POA: Diagnosis not present

## 2020-07-11 DIAGNOSIS — G893 Neoplasm related pain (acute) (chronic): Secondary | ICD-10-CM | POA: Diagnosis not present

## 2020-07-11 DIAGNOSIS — D6481 Anemia due to antineoplastic chemotherapy: Secondary | ICD-10-CM | POA: Diagnosis not present

## 2020-07-11 DIAGNOSIS — Z5112 Encounter for antineoplastic immunotherapy: Secondary | ICD-10-CM | POA: Diagnosis not present

## 2020-07-11 MED ORDER — SODIUM CHLORIDE 0.9 % IV SOLN
700.0000 mg | Freq: Once | INTRAVENOUS | Status: AC
  Administered 2020-07-11: 700 mg via INTRAVENOUS
  Filled 2020-07-11: qty 20

## 2020-07-11 MED ORDER — CYANOCOBALAMIN 1000 MCG/ML IJ SOLN
1000.0000 ug | Freq: Once | INTRAMUSCULAR | Status: AC
  Administered 2020-07-11: 1000 ug via INTRAMUSCULAR
  Filled 2020-07-11: qty 1

## 2020-07-11 MED ORDER — HEPARIN SOD (PORK) LOCK FLUSH 100 UNIT/ML IV SOLN
INTRAVENOUS | Status: AC
  Filled 2020-07-11: qty 5

## 2020-07-11 MED ORDER — PALONOSETRON HCL INJECTION 0.25 MG/5ML
0.2500 mg | Freq: Once | INTRAVENOUS | Status: AC
  Administered 2020-07-11: 0.25 mg via INTRAVENOUS
  Filled 2020-07-11: qty 5

## 2020-07-11 MED ORDER — HEPARIN SOD (PORK) LOCK FLUSH 100 UNIT/ML IV SOLN
500.0000 [IU] | Freq: Once | INTRAVENOUS | Status: AC | PRN
  Administered 2020-07-11: 500 [IU]
  Filled 2020-07-11: qty 5

## 2020-07-11 MED ORDER — SODIUM CHLORIDE 0.9 % IV SOLN
200.0000 mg | Freq: Once | INTRAVENOUS | Status: AC
  Administered 2020-07-11: 200 mg via INTRAVENOUS
  Filled 2020-07-11: qty 8

## 2020-07-11 MED ORDER — SODIUM CHLORIDE 0.9 % IV SOLN
10.0000 mg | Freq: Once | INTRAVENOUS | Status: AC
  Administered 2020-07-11: 10 mg via INTRAVENOUS
  Filled 2020-07-11: qty 1

## 2020-07-11 MED ORDER — SODIUM CHLORIDE 0.9 % IV SOLN
Freq: Once | INTRAVENOUS | Status: AC
  Filled 2020-07-11: qty 250

## 2020-07-11 NOTE — Progress Notes (Signed)
Pt received prescribed treatment in clinic, pt stable at d/c. 

## 2020-07-12 ENCOUNTER — Other Ambulatory Visit: Payer: Self-pay | Admitting: Hematology and Oncology

## 2020-07-12 ENCOUNTER — Other Ambulatory Visit: Payer: Self-pay

## 2020-07-12 ENCOUNTER — Inpatient Hospital Stay: Payer: Medicare Other

## 2020-07-12 VITALS — BP 100/64 | HR 79 | Temp 97.4°F

## 2020-07-12 DIAGNOSIS — G893 Neoplasm related pain (acute) (chronic): Secondary | ICD-10-CM | POA: Diagnosis not present

## 2020-07-12 DIAGNOSIS — Z5112 Encounter for antineoplastic immunotherapy: Secondary | ICD-10-CM | POA: Diagnosis not present

## 2020-07-12 DIAGNOSIS — C3431 Malignant neoplasm of lower lobe, right bronchus or lung: Secondary | ICD-10-CM

## 2020-07-12 DIAGNOSIS — Z923 Personal history of irradiation: Secondary | ICD-10-CM | POA: Diagnosis not present

## 2020-07-12 DIAGNOSIS — D6481 Anemia due to antineoplastic chemotherapy: Secondary | ICD-10-CM | POA: Diagnosis not present

## 2020-07-12 DIAGNOSIS — C7951 Secondary malignant neoplasm of bone: Secondary | ICD-10-CM | POA: Diagnosis not present

## 2020-07-12 MED ORDER — PEGFILGRASTIM-JMDB 6 MG/0.6ML ~~LOC~~ SOSY
6.0000 mg | PREFILLED_SYRINGE | Freq: Once | SUBCUTANEOUS | Status: AC
Start: 2020-07-12 — End: 2020-07-12
  Administered 2020-07-12: 6 mg via SUBCUTANEOUS
  Filled 2020-07-12: qty 0.6

## 2020-07-16 ENCOUNTER — Inpatient Hospital Stay: Payer: Medicare Other | Admitting: Hospice and Palliative Medicine

## 2020-07-16 ENCOUNTER — Telehealth: Payer: Self-pay

## 2020-07-18 ENCOUNTER — Other Ambulatory Visit: Payer: Self-pay

## 2020-07-18 ENCOUNTER — Inpatient Hospital Stay: Payer: Medicare Other | Attending: Hematology and Oncology

## 2020-07-18 ENCOUNTER — Inpatient Hospital Stay: Payer: Medicare Other | Attending: Hospice and Palliative Medicine | Admitting: Hospice and Palliative Medicine

## 2020-07-18 DIAGNOSIS — F419 Anxiety disorder, unspecified: Secondary | ICD-10-CM

## 2020-07-18 DIAGNOSIS — C3431 Malignant neoplasm of lower lobe, right bronchus or lung: Secondary | ICD-10-CM | POA: Diagnosis not present

## 2020-07-18 DIAGNOSIS — Z923 Personal history of irradiation: Secondary | ICD-10-CM | POA: Insufficient documentation

## 2020-07-18 DIAGNOSIS — C7951 Secondary malignant neoplasm of bone: Secondary | ICD-10-CM | POA: Diagnosis not present

## 2020-07-18 DIAGNOSIS — G893 Neoplasm related pain (acute) (chronic): Secondary | ICD-10-CM | POA: Diagnosis not present

## 2020-07-18 LAB — CBC WITH DIFFERENTIAL/PLATELET
Abs Immature Granulocytes: 0.13 10*3/uL — ABNORMAL HIGH (ref 0.00–0.07)
Basophils Absolute: 0.1 10*3/uL (ref 0.0–0.1)
Basophils Relative: 1 %
Eosinophils Absolute: 0.1 10*3/uL (ref 0.0–0.5)
Eosinophils Relative: 1 %
HCT: 34.4 % — ABNORMAL LOW (ref 39.0–52.0)
Hemoglobin: 10.8 g/dL — ABNORMAL LOW (ref 13.0–17.0)
Immature Granulocytes: 1 %
Lymphocytes Relative: 7 %
Lymphs Abs: 0.7 10*3/uL (ref 0.7–4.0)
MCH: 27 pg (ref 26.0–34.0)
MCHC: 31.4 g/dL (ref 30.0–36.0)
MCV: 86 fL (ref 80.0–100.0)
Monocytes Absolute: 1 10*3/uL (ref 0.1–1.0)
Monocytes Relative: 10 %
Neutro Abs: 8.2 10*3/uL — ABNORMAL HIGH (ref 1.7–7.7)
Neutrophils Relative %: 80 %
Platelets: 124 10*3/uL — ABNORMAL LOW (ref 150–400)
RBC Morphology: NONE SEEN
RBC: 4 MIL/uL — ABNORMAL LOW (ref 4.22–5.81)
RDW: 15.1 % (ref 11.5–15.5)
WBC: 10.3 10*3/uL (ref 4.0–10.5)
nRBC: 0 % (ref 0.0–0.2)

## 2020-07-18 MED ORDER — PROCHLORPERAZINE MALEATE 10 MG PO TABS: 10.0000 mg | ORAL_TABLET | Freq: Four times a day (QID) | ORAL | 0 refills | Status: DC | PRN

## 2020-07-18 MED ORDER — ALPRAZOLAM 0.5 MG PO TABS: 0.5000 mg | ORAL_TABLET | Freq: Four times a day (QID) | ORAL | 0 refills | Status: DC | PRN

## 2020-07-18 MED ORDER — OXYCODONE HCL 20 MG PO TABS: ORAL_TABLET | ORAL | 0 refills | Status: DC

## 2020-07-18 NOTE — Progress Notes (Signed)
Virtual Visit via Telephone Note  I connected with Jimmy West on 07/18/20 at 11:30 AM EST by telephone and verified that I am speaking with the correct person using two identifiers.   I discussed the limitations, risks, security and privacy concerns of performing an evaluation and management service by telephone and the availability of in person appointments. I also discussed with the patient that there may be a patient responsible charge related to this service. The patient expressed understanding and agreed to proceed.   History of Present Illness: Mr. 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 Patient has had chronic neoplasm related pain and severe anxiety/depression.  He was referred to palliative care clinic to assist with symptom management and to establish treatment goals.   Observations/Objective: Patient reports he is doing reasonably well.  He had an episode of abdominal cramping and nausea over the weekend but he associated that with constipation and says it resolved when he had a bowel movement.  We discussed constipation management today.  He had tried taking Zofran and did not find it effective.  Explained that it likely would not be in the setting of constipation.  However, he is interested in something stronger for nausea.  Will prescribe Compazine.  Patient also requests refill of his alprazolam and oxycodone.  Assessment and Plan: Stage IV lung cancer -on treatment with immunotherapy.  Followed by Dr. Mike West  Neoplasm related pain -continue oxycodone as needed, refill #60  Anxiety -continue alprazolam as needed, refill #60  Depression -continue Prozac.    Nausea -Compazine 10 mg every 6 hours as needed  Constipation -continue daily senna and MiraLAX  PDMP reviewed  Follow Up Instructions: Follow-up MyChart visit 1-2 months   I discussed the assessment and treatment  plan with the patient. The patient was provided an opportunity to ask questions and all were answered. The patient agreed with the plan and demonstrated an understanding of the instructions.   The patient was advised to call back or seek an in-person evaluation if the symptoms worsen or if the condition fails to improve as anticipated.  I provided 7 minutes of non-face-to-face time during this encounter.   Jimmy Hong, NP

## 2020-07-25 ENCOUNTER — Other Ambulatory Visit: Payer: Self-pay | Admitting: *Deleted

## 2020-07-25 DIAGNOSIS — G893 Neoplasm related pain (acute) (chronic): Secondary | ICD-10-CM

## 2020-07-25 MED ORDER — FOLIC ACID 1 MG PO TABS: ORAL_TABLET | ORAL | 3 refills | Status: DC

## 2020-07-25 MED ORDER — OXYCODONE HCL 20 MG PO TABS: ORAL_TABLET | ORAL | 0 refills | Status: DC

## 2020-07-25 MED ORDER — FLUOXETINE HCL 40 MG PO CAPS: 40.0000 mg | ORAL_CAPSULE | Freq: Every day | ORAL | 3 refills | Status: DC

## 2020-07-25 NOTE — Telephone Encounter (Signed)
Pt requesting refills for prozac, folic acid, and oxycodone. States that oxycodone is not due until Saturday but wanted to go ahead and put in the request.

## 2020-07-26 ENCOUNTER — Inpatient Hospital Stay: Payer: Medicare Other

## 2020-07-30 ENCOUNTER — Other Ambulatory Visit: Payer: Self-pay | Admitting: *Deleted

## 2020-07-30 DIAGNOSIS — F419 Anxiety disorder, unspecified: Secondary | ICD-10-CM

## 2020-07-30 MED ORDER — ALPRAZOLAM 0.5 MG PO TABS: 0.5000 mg | ORAL_TABLET | Freq: Four times a day (QID) | ORAL | 0 refills | Status: DC | PRN

## 2020-07-30 NOTE — Progress Notes (Signed)
[facility]  [address] [place], [place] [address] Phone: [phone]  Fax: [phone]   Clinic Day: 07/31/20  Referring physician: [name], NP  Chief Complaint: Jimmy West is a 70 y.o. male with metastatic adenocarcinoma of the lung who is seen for assessment prior to cycle #20 Alimta and pembrolizumab.  HPI: The patient was last seen in the medical oncology clinic on 07/10/2020. At that time, he has been "good." He denies any bone pain. Appetite fluctuated. His shortness of breath had improved. His back pain was noted to come and go. His mood has been a little bit better. Exam was unremarkable. Hematocrit was 33.6, hemoglobin 10.6, MCV 83.8, platelets 231,000, WBC 9,300. CMP was normal. TSH was 0.783. He continued Eliquis.  He received cycle #19 Alimta and pembrolizumab on 07/11/2020 with Fulphila support on 07/12/2020. He received a vitamin B12 injection. He continued folic acid.  The patient spoke to Billey Chang, NP on 07/18/2020. Hematocrit was 34.4, hemoglobin 10.8, MCV 86.0, platelets 124,000, WBC 10,300 (ANC 8200). He requested something stronger for nausea. He was prescribed compazine and was to continue all other medications. Follow up was planned for 1-2 months.  During the interim, he has been "pretty good." He had one episode of nausea and took nausea medication. A few minutes after he took it, he vomited.   His upper back pain is about the same but he has been having more pain on the left side. He has a bulging disk that has been bothering him lately. His shortness of breath is okay. His appetite comes and goes. His mood is much better. He bruises easily. He has been trying to do more around the house instead of watching TV all day.  He took two Decadron pills yesterday morning and one yesterday evening. He is still a bit confused about the dosage.  He is taking folic acid.   Past Medical History:  Diagnosis Date   . Bulging lumbar disc   . Cancer ([place])    stage 4 lung cancer  . Heart attack [facility])     Past Surgical History:  Procedure Laterality Date  . CARDIAC CATHETERIZATION     two stents  . KNEE SURGERY Left   . PORTA CATH INSERTION N/A 05/21/2018   Procedure: PORTA CATH INSERTION;  Surgeon: Algernon Huxley, MD;  Location: [facility];  Service: Cardiovascular;  Laterality: N/A;    Family History  Problem Relation Age of Onset  . Heart failure Father   . Cancer Maternal Aunt   . Heart failure Maternal Uncle   . Dementia Paternal Grandmother   . Cancer Maternal Aunt     Social History:  reports that he quit smoking about 16 years ago. His smoking use included cigarettes. He has a 22.50 pack-year smoking history. He has never used smokeless tobacco. He reports previous alcohol use. He reports that he does not use drugs. He started smoking at age 43. He is smoking 1 1/2 packs/day. Patient denies known exposures to radiation ortoxins. 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 He has not been working recently as the salon was closed. He plays golf occasionally (none recently).He has 8 cats. The patient is alone today.   Allergies: No Known Allergies  Current Medications: Current Outpatient Medications  Medication Sig Dispense Refill  . albuterol (VENTOLIN HFA) 108 (90 Base) MCG/ACT inhaler Inhale 2 puffs into the lungs every 6 (six) hours as needed for  wheezing or shortness of breath. 18 g 3  . ALPRAZolam (XANAX) 0.5 MG tablet Take 1 tablet (0.5 mg total) by mouth 4 (four) times daily as needed for anxiety. 60 tablet 0  . Calcium 600-400 MG-UNIT CHEW Chew 2 tablets by mouth daily.    Marland Kitchen ELIQUIS 5 MG TABS tablet TAKE 1 TABLET BY MOUTH TWICE DAILY 60 tablet 5  . FLUoxetine (PROZAC) 40 MG capsule Take 1 capsule (40 mg total) by mouth daily. 30 capsule 3  . folic acid (FOLVITE) 1 MG tablet Take 1 tablet (1 mg total) by mouth daily. Continue until 21 days after  Alimta completed. 30 tablet 3  . ondansetron (ZOFRAN-ODT) 8 MG disintegrating tablet DISSOLVE 1 TABLET ON TONGUE EVERY 8 HOURS AS NEEDED FOR NAUSEA OR VOMITING 20 tablet 1  . Oxycodone HCl 20 MG TABS TAKE 1/2 TO 1 TABLET BY MOUTH EVERY 4 HOURS AS NEEDED FOR SEVERE PAIN 60 tablet 0  . polyethylene glycol (MIRALAX / GLYCOLAX) packet Take 17 g by mouth daily.    . prochlorperazine (COMPAZINE) 10 MG tablet Take 1 tablet (10 mg total) by mouth every 6 (six) hours as needed for nausea or vomiting. 30 tablet 0  . senna (SENOKOT) 8.6 MG tablet Take 1 tablet by mouth as needed.     Marland Kitchen acetaminophen (TYLENOL) 500 MG tablet Take 500 mg by mouth 3 (three) times daily as needed.  (Patient not taking: No sig reported)     No current facility-administered medications for this visit.   Facility-Administered Medications Ordered in Other Visits  Medication Dose Route Frequency Provider Last Rate Last Admin  . 0.9 %  sodium chloride infusion   Intravenous Once Kylene Zamarron C, MD      . 0.9 %  sodium chloride infusion   Intravenous Once PRN Tikita Mabee C, MD      . albuterol (PROVENTIL) (2.5 MG/3ML) 0.083% nebulizer solution 2.5 mg  2.5 mg Nebulization Once PRN Braeden Kennan C, MD      . alteplase (CATHFLO ACTIVASE) injection 2 mg  2 mg Intracatheter Once PRN Lequita Asal, MD      . EPINEPHrine (ADRENALIN) 1 MG/10ML injection 0.25 mg  0.25 mg Intravenous Once PRN Sallyanne Birkhead C, MD      . EPINEPHrine (ADRENALIN) 1 MG/10ML injection 0.25 mg  0.25 mg Intravenous Once PRN Trenise Turay C, MD      . heparin lock flush 100 unit/mL  500 Units Intracatheter Once PRN Mike Gip, Meara Wiechman C, MD      . heparin lock flush 100 unit/mL  250 Units Intracatheter Once PRN Nolon Stalls C, MD      . sodium chloride flush (NS) 0.9 % injection 10 mL  10 mL Intravenous PRN Nolon Stalls C, MD   10 mL at 03/04/19 0959  . sodium chloride flush (NS) 0.9 % injection 10 mL  10 mL Intracatheter Once PRN  Jabe Jeanbaptiste C, MD      . sodium chloride flush (NS) 0.9 % injection 3 mL  3 mL Intracatheter Once PRN Lequita Asal, MD        Review of Systems  Constitutional: Negative for chills, diaphoresis, fever, malaise/fatigue and weight loss (up 1 lb).       Feels "pretty good."  HENT: Negative.  Negative for congestion, ear discharge, ear pain, hearing loss, nosebleeds, sinus pain, sore throat and tinnitus.   Eyes: Negative.  Negative for blurred vision and double vision.  Respiratory: Positive for shortness of breath (on  exertion). Negative for cough, hemoptysis and sputum production.   Cardiovascular: Negative.  Negative for chest pain, palpitations, orthopnea and leg swelling.  Gastrointestinal: Negative for abdominal pain, blood in stool, constipation, diarrhea, heartburn, melena, nausea and vomiting.       Appetite fluctuates.  Genitourinary: Negative.  Negative for dysuria, frequency, hematuria and urgency.  Musculoskeletal: Positive for back pain (upper back). Negative for falls, joint pain, myalgias and neck pain.       Pain due to bulging disk.  Skin: Negative.  Negative for itching and rash.  Neurological: Negative.  Negative for dizziness, tingling, sensory change, focal weakness, weakness and headaches.  Endo/Heme/Allergies: Negative for environmental allergies and polydipsia. Bruises/bleeds easily.  Psychiatric/Behavioral: Positive for depression (Mood is much better). Negative for memory loss. The patient is nervous/anxious (on Xanax). The patient does not have insomnia.   All other systems reviewed and are negative.  Performance status (ECOG): 1  Vitals Blood pressure 116/71, pulse 84, temperature (!) 97 F (36.1 C), temperature source Tympanic, resp. rate 16, weight 130 lb 15.3 oz (59.4 kg), SpO2 96 %.   Physical Exam Vitals and nursing note reviewed.  Constitutional:      General: He is not in acute distress.    Appearance: He is well-developed. He is not  diaphoretic.     Interventions: Face mask in place.  HENT:     Head: Normocephalic and atraumatic.     Comments: Thin brown graying hair. Mustache.    Mouth/Throat:     Mouth: Mucous membranes are moist.     Pharynx: Oropharynx is clear.  Eyes:     General: No scleral icterus.    Extraocular Movements: Extraocular movements intact.     Conjunctiva/sclera: Conjunctivae normal.     Pupils: Pupils are equal, round, and reactive to light.     Comments: Glasses.  Gray/blue eyes.  Cardiovascular:     Rate and Rhythm: Normal rate and regular rhythm.     Heart sounds: Normal heart sounds. No murmur heard.   Pulmonary:     Effort: Pulmonary effort is normal. No respiratory distress.     Breath sounds: Normal breath sounds. No wheezing or rales.  Chest:     Chest wall: No tenderness.  Breasts:     Right: No axillary adenopathy or supraclavicular adenopathy.     Left: No axillary adenopathy or supraclavicular adenopathy.    Abdominal:     General: Bowel sounds are normal. There is no distension.     Palpations: Abdomen is soft. There is no mass.     Tenderness: There is no abdominal tenderness. There is no guarding or rebound.  Musculoskeletal:        General: No swelling or tenderness. Normal range of motion.     Cervical back: Normal range of motion and neck supple.  Lymphadenopathy:     Head:     Right side of head: No preauricular, posterior auricular or occipital adenopathy.     Left side of head: No preauricular, posterior auricular or occipital adenopathy.     Cervical: No cervical adenopathy.     Upper Body:     Right upper body: No supraclavicular or axillary adenopathy.     Left upper body: No supraclavicular or axillary adenopathy.     Lower Body: No right inguinal adenopathy. No left inguinal adenopathy.  Skin:    General: Skin is warm and dry.     Comments: Bruises on hands.  Neurological:     Mental Status: He  is alert and oriented to person, place, and time.  Mental status is at baseline.  Psychiatric:        Behavior: Behavior normal.        Thought Content: Thought content normal.        Judgment: Judgment normal.    Imaging studies: 04/30/2018:  PET scanrevealed a 3.4 cm hypermetabolic RLL pulmonary mass (SUV 13.2), 11 mm pulmonary nodule in the LEFT lung (SUV 4.5), RIGHT paratracheal lymph node (SUV 5.1), and hypermetabolic activity within the T4 vertebral body (SUV 10.2).  05/03/2018:  Thoracic spine MRIon 05/03/2018 revealed T4 metastasiswith a 40% pathologic compression deformity and 5 mm of retropulsion of the vertebral body. Retropulsion results in mild spinal canal stenosis and mild bilateral C4-5 foraminal stenosis. There was paravertebral soft tissue thickening from mid T3 to mid T5, likely representing edema related to the pathologic compression deformity vs. possible extraosseous extension of the neoplasm. There were no additional thoracic spinal metastases noted.  05/03/2018:  Head MRIrevealed no intracranial metastatic disease. 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 Incidental mention made of mild paranasal sinus disease. 01/10/2019:  Cervical and thoracic spine CT at [facility] unchanged severe compression deformity/vertebral plana of T4 with a stable degree of retropulsion and focal mild spinal canal stenosis. There was interval enlargement of a right lower lobe pulmonary mass(4.5 x 3.5 cm compared to 3.9 x 2.9 cm)with redemonstrated spiculated pulmonary nodules. 01/28/2019:  Chest, abdomen, pelvisCTrevealed slight interval enlargement of a right lower lobe mass with central necrosis measuring 4.6 x 3.4 cm, previously 4.0 x 3.0 cm. There was no change in right upper lobe nodules(1.4 and 0.8 cm).Therewas almost no residua of left lung nodules, with  irregular opacities in the apical left upper lobeandsuperior segment left lower lobe. There was no change in right hilar soft tissue and lymph nodes.There wasvertebra plana deformity of T4andno evidence of new osseous metastatic disease.There was no evidence of distant metastatic disease in the abdomen or pelvis.The1.6 x 1.1 cmleft adrenal nodule(non-metabolic on prior PET scan),was unchanged. 03/24/2019:  Renal ultrasoundrevealed no acute abnormality identified.There was no hydronephrosis or bladder distention. 04/23/2019:  Abdomen and pelvis CT revealed no acute abdominal or pelvic pathology. There was fluid in the colon which can be seen with diarrhea. There was diverticulosis without evidence of diverticulitis. There was a stable 1.5 cm left adrenal nodule. 05/06/2019:  Chest, abdomen, and pelvisCTrevealed a positive response to interval therapy for the known right lower lobe lung carcinoma(4.6 x 3.4 cm to3.1 x 2.8 cm transversely). There hadbeen a mild interval decrease contiguous soft tissue that extends along the right hilum. The two right upper lobe noduleswerestable from the most recent prior study (smaller than 04/27/2018).There are no new lung nodules.Therewasno evidence of new metastatic disease.There was stable severe compression deformity/vertebra plana of T4.Therewasno evidence of other osseous metastatic disease.There was a stable left adrenal nodule consistent with an adenoma. 05/30/2019:  ChestCT angiogramrevealed a tiny subsegmental pulmonary embolusin the right lower lobe. There areas of ground-glass at the periphery of the right chest hadbecome more conspicuous. This mayrelate to mild pneumonitis, perhaps due to radiation, attention on follow-up.The dominant nodule in the right lung basewasstable in size.  08/14/2019:  Cervical spine MRIrevealed no evidence of metastatic disease within the cervical spine. 09/16/2019:  Chest, abdomen and  pelvis CTrevealed asimilar-appearing spiculated right lower lobe nodule.There was no evidence for  metastatic disease in the abdomen or pelvis.There was stable left adrenal adenoma. 12/15/2019:  Chest CT revealed an enlarging right lower lobe mass c/w malignancy (3.0 x 2.4 cm to 3.3 x 2.7 cm).  There was slight enlargement in a spiculated right upper lobe subpleural nodule (0.8 x 1.5 cm to 1.2 x 0.7 cm). 01/16/2020:  Bone scan revealed no scintigraphic evidence of osseous metastatic disease. 03/21/2020:  Chest CT angiogram revealed no evidence of pulmonary embolism.  A rounded spiculated mass in the RUL measuring 3.4 x 3 cm which was slightly enlarged with adjacent pleural thickening.  There was a stable 1.4 x 0.6 cm irregular subpleural density in the RUL.  There was stable severe compression deformity in the upper thoracic vertebral body consistent with an old fracture. 06/26/2020:  Head CT with or without contrast revealed no evidence of intracranial metastasis. 06/26/2020:  Chest, abdomen, and pelvis CT with contrast revealed a 3.1 x 2.4 cm (previously 3.4 x 3.0 cm) spiculated mass in the superior segment of the RLL.  There was a stable 1.4 x 0.7 cm spiculated subpleural nodule in the posterolateral RUL.  There was a stable high grade vertebral plana deformity of the T4 vertebral body.  There was no evidence of metastatic disease in the abdomen or pelvis.  There was mild stable splenomegaly (13.6 cm). 06/26/2020:  Bone scan revealed no evidence of metastatic disease. 07/05/2020:  PET scan revealed a 3.1 x 2.5 cm mass within superior segment of the RLL with intense FDG uptake (SUV 8.29) c/w metabolically active tumor. Additionally, there were 2 small nodules within the posterior RUL which were FDG avid (1 cm ; SUV 3.84) concerning for additional sites of disease. There was a geographic, curvilinear bandlike area of increased uptake within the posterior left lung base that corresponded to an area of  airspace and ground-glass attenuation. The geographic distribution of this finding favored set inflammatory or infectious process.   Infusion on 07/31/2020  Component Date Value Ref Range Status  . Sodium 07/31/2020 138  135 - 145 mmol/L Final   Performed at [facility], [address] [place], [place] [address]  . Potassium 07/31/2020 4.3  3.5 - 5.1 mmol/L Final   Performed at [facility], [address] [place], [place] [address]  . Chloride 07/31/2020 102  98 - 111 mmol/L Final   Performed at [facility], [address] [place], [place] [address]  . CO2 07/31/2020 23  22 - 32 mmol/L Final   Performed at [facility], [address], [place], [place] [address]  . Glucose, Bld 07/31/2020 155* 70 - 99 mg/dL Final   Comment: Glucose reference range applies only to samples taken after fasting for at least 8 hours. Performed at [facility], [address] [place], [place] [address]   . BUN 07/31/2020 19  8 - 23 mg/dL Final   Performed at [facility], [address] [place], [place] [address]  . Creatinine, Ser 07/31/2020 1.04  0.61 - 1.24 mg/dL Final   Performed at [facility], [address] [place], [place] [address]  . Calcium 07/31/2020 9.1  8.9 - 10.3 mg/dL Final   Performed at [facility], [address] [place], [place] [address]  . Total Protein 07/31/2020 6.7  6.5 - 8.1 g/dL Final   Performed at [facility], [address] [place], [place] [address]  . Albumin 07/31/2020 3.1* 3.5 - 5.0 g/dL Final   Performed at  [facility], [address], [place], [place] [address]  . AST 07/31/2020 32  15 - 41 U/L Final   Performed at [facility], [address] [place], [place] [address]  . ALT 07/31/2020 19  0 - 44 U/L Final   Performed at [facility], [address] [place], [place] [address]  . Alkaline Phosphatase 07/31/2020 86  38 - 126 U/L Final    Performed at [facility], [address] [place], [place] [address]  . Total Bilirubin 07/31/2020 0.4  0.3 - 1.2 mg/dL Final   Performed at [facility], [address] [place], [place] [address]  . GFR, Estimated 07/31/2020 >60  >60 mL/min Final   Comment: (NOTE) Calculated using the CKD-EPI Creatinine Equation (2021) Performed at [facility], [address] [place], [place] [address]   . Anion gap 07/31/2020 13  5 - 15 Final   Performed at [facility], [address] [place], [place] [address]  . WBC 07/31/2020 14.3* 4.0 - 10.5 K/uL Final  . RBC 07/31/2020 3.47* 4.22 - 5.81 MIL/uL Final  . Hemoglobin 07/31/2020 9.1* 13.0 - 17.0 g/dL Final  . HCT 07/31/2020 29.2* 39.0 - 52.0 % Final  . MCV 07/31/2020 84.1  80.0 - 100.0 fL Final  . MCH 07/31/2020 26.2  26.0 - 34.0 pg Final  . MCHC 07/31/2020 31.2  30.0 - 36.0 g/dL Final  . RDW 07/31/2020 15.7* 11.5 - 15.5 % Final  . Platelets 07/31/2020 532* 150 - 400 K/uL Final  . nRBC 07/31/2020 0.0  0.0 - 0.2 % Final  . Neutrophils Relative % 07/31/2020 88  % Final  . Neutro Abs 07/31/2020 12.7* 1.7 - 7.7 K/uL Final  . Lymphocytes Relative 07/31/2020 5  % Final  . Lymphs Abs 07/31/2020 0.6* 0.7 - 4.0 K/uL Final  . Monocytes Relative 07/31/2020 6  % Final  . Monocytes Absolute 07/31/2020 0.8  0.1 - 1.0 K/uL Final  . Eosinophils Relative 07/31/2020 0  % Final  . Eosinophils Absolute 07/31/2020 0.0  0.0 - 0.5 K/uL Final  . Basophils Relative 07/31/2020 0  % Final  . Basophils Absolute 07/31/2020 0.0  0.0 - 0.1 K/uL Final  . Immature Granulocytes 07/31/2020 1  % Final  . Abs Immature Granulocytes 07/31/2020 0.19* 0.00 - 0.07 K/uL Final   Performed at [facility], [address], [place], [place] [address]  . Magnesium 07/31/2020 2.1  1.7 - 2.4 mg/dL Final   Performed at [facility], [address] [place],  [address]    Assessment:  Jimmy West is a 70  y.o. male with metastatichigh-grade adenocarcinomaof the right lungs/p CT-guided biopsy of a RLL lung mass on 05/11/2018. Pathologyrevealed an invasive high-grade adenocarcinoma, with predominantly solid growth pattern. The neoplastic cells were TTF-1 (+), Napsin A (+), and P40 (+). He has a T4 vertebral metastasis. Clinical stage is T4N1M1.  There was not enough material for Foundation One testing. PDL-1revealed TPS 90%.  PET scanon 04/30/2018 revealed a 3.4 cm hypermetabolic RLL pulmonary mass (SUV 13.2), 11 mm pulmonary nodule in the LEFT lung (SUV 4.5), RIGHT paratracheal lymph node (SUV 5.1), and hypermetabolic activity within the T4 vertebral body (SUV 10.2).   Hereceived 11cycles ofpembrolizumab(05/24/2018 - 02/07/2019). He toleratedtreatment well. CEAwas 1.3 on 08/30/2018.LDHwas 165 on 11/29/2018.  He completed T4 radiationon 07/07/2018. He receives Xgevamonthly (06/03/2018 -03/12/2020).  He received 1 cycle of carboplatin, Alimta, and pembrolizumabon 02/08/2019. Cycle #1 was complicated by  nausea, vomiting, and diarrhea necessitating hospitalization.Decision made to hold carboplatin with his second dose.He iss/p2cycles ofAlimta and pembrolizumab (02/28/2019 - 03/29/2019).Cycle #2 was complicated by nausea, vomiting, and dehydration requring fluids in clinic and an overnight stay in the hospital. Hereceivedcarboplatin (AUC 2), Alimta, and pebrolizumab(04/18/2019).He tolerated it poorly.   Hehasreceived19 cycles ofAlimta and pembrolizumabon (05/09/2019 -04/11/2020; 07/11/2020). He began Fulphila with cycle #14 secondary to neutropenia.  He has tolerated chemotherapy well. He receives B12every 9 weeks (last12/22/2021).  Chest CT angiogram on 03/21/2020 revealed no evidence of pulmonary embolism.  A rounded spiculated 3.4 x 3 cm mass in the RUL was slightly enlarged with adjacent pleural thickening.  There was a stable 1.4 x 0.6 cm  irregular subpleural density in the RUL.  There was stable severe compression deformity in the upper thoracic vertebral body consistent with an old fracture.  CT guided right lower lobe lung biopsy on 05/09/2020 revealed non-small cell carcinoma in a background of significant necrosis. Immunohistochemical studies showed strong staining for CK7, both in viable tumor cells and necrotic debris. There was patchy marking for p40 and p63. TTF-1 and Napsin-A were negative.  This may represent potential transformation to a carcinoma with squamous differentiation or the original biopsy could have represented the glandular component of an adenosquamous carcinoma.  Foundation One on 05/09/2020 revealed no reportable alterations.  Microsatellite status was stable, tumor mutational burden 5 Muts/MB, CDKN2A loss, CDKN2B loss, KRAS G12D, MTAP loss, NF2 E442*, STK11 P203 fs*84 and TP53 Y205S.  There were no reportable alterations in ALK, BRAF, EGFR, ERBB2, MET, RET, and ROS1.  Head CT on 06/26/2020 revealed no evidence of intracranial metastasis.  Chest, abdomen, and pelvis CT on 06/26/2020 revealed a 3.1 x 2.4 cm (previously 3.4 x 3.0 cm) spiculated mass in the superior segment of the RLL.  There was a stable 1.4 x 0.7 cm spiculated subpleural nodule in the posterolateral RUL.  There was a stable high grade vertebral plana deformity of the T4 vertebral body.  There was no evidence of metastatic disease in the abdomen or pelvis.  There was mild stable splenomegaly (13.6 cm).  Bone scan on 06/26/2020 revealed no evidence of metastatic disease.  PET scan on 07/05/2020 revealed a 3.1 x 2.5 cm mass within superior segment of the RLL with intense FDG uptake (SUV 0.76) c/w metabolically active tumor. Additionally, there were 2 small nodules within the posterior RUL which were FDG avid (1 cm ; SUV 3.84) concerning for additional sites of disease. There was a geographic, curvilinear bandlike area of increased uptake within the  posterior left lung base that corresponded to an area of airspace and ground-glass attenuation. The geographic distribution of this finding favored set inflammatory or infectious process.  He hascancer-related painin T4. He is off the [employer].  He is taking oxycodone10-60m every 4 hours prn.  He has cervicalgia.Cervical spine MRIon 08/14/2019 showed no evidence of metastatic disease within the cervical spine. Heis followedby Dr YIzora Ribas  He has chemotherapy induced anemia.  He received Retacrit on 11/01/2019 (last 04/09/2020).  Hehad transient renal insufficiencyon 03/23/2019. Creatinine was 1.32 (baseline 0.61-1.0). Renal ultrasoundon 03/24/2019 revealed no acute abnormality identified.There was no hydronephrosis or bladder distention.Creatinine is 0.82 today.  Stool waspositive for C difficile + diarrheaon 06/14/2019.Hewas treated with oral vancomycin.  He received the high dose influenza vaccine on 06/11/2020  Symptomatically, he feels "pretty good." He had one episode of nausea and vomiting.  Back pain is similar.  He denies any increased shortness of breath. Exam is stable.  Plan:  1.   Labs: CBC with diff, CMP, Mg, TSH, T4. 2. Metastatic high-grade adenocarcinoma the RIGHT lung He is s/p 11 cycles of pembrolizumab. He is s/p 1 cycle of carboplatin, Almita, and pembrolizumab (02/08/2019). He is s/p 1 cycle of carboplatin (AUC 2), Alimta, and pembrolizumab (04/18/2019). He is s/p18cycles of Alimta and pembrolizumab (02/28/2019 - 03/29/2019; 05/09/2019-04/11/2020). Chest CT angiogram on 09/01/2021revealed slight growth in the RUL mass.  CT guided RLL lung biopsy on 05/09/2020 revealed potential transformation to squamous differentiation.   Foundation One revealed no driver mutations.     He is intolerant to carboplatin at an AUC of 5 as well as 2.  PET scan on 07/05/2020 revealed no evidence of disease progression.   PET scan documented stable  disease as compared to images from 08/2019  He is s/p cycle #19 Alimta and pembrolizumab on 07/11/2020 with Fulphila support on 07/12/2020.  Plan:  Continuation of Alimta and pembrolizumab based on stability of disease and tolerance of treatment.   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   At some point, his disease will progress and require Taxotere.  Patient would like to postpone until necessary.   He requires Fulphila support secondary to neutropenia at nadir  Labs reviewed.  Cycle #20 Alimta and pembrolizumab today.  Discuss symptom management.  He has antiemetics and pain medications at home to use on a prn bases.  Interventions are adequate.    3. Bone metastasis Symptomatically, he denies any new bone pain.  Bone scan on 06/26/2020 revealed no evidence of metastatic disease.  PET scan on 07/05/2020 revealed no evidence of metastatic disease.  He last received Xgeva on 03/12/2020.  Plan to reinstitute Xgeva if progressive bone disease is documented. 4.Anxiety and depression Clinically, he is doing well.   He remains on low-dose Xanax as well as Prozac.  Continue to monitor. 5.Pulmonary embolism Continue Eliquis. 6.   Normocytic anemia  Hematocrit 29.2.  Hemoglobin 9.1.  MCV 84.1 today.  Patient denies any bleeding.  Diet is stable.  Suspect hemotherapy induced anemia.  Check ferritin and iron studies today.  Consider Retacrit. 7.   Cycle #20 Alimta and pembrolizumab today. 8.   RTC tomorrow for Fulphila. 9.   RTC in 3 weeks for MD assessment, labs (CBC with diff, CMP, Mg, TSH), and cycle #21 Alimta and pembrolizumab.  I discussed the assessment and treatment plan with the patient.  The patient was provided an opportunity to ask questions and all were answered.  The patient agreed with the plan and demonstrated an understanding of the  instructions.  The patient was advised to call back if the symptoms worsen or if the condition fails to improve as anticipated.   Lequita Asal, MD, PhD 07/31/2020; 10:18 AM   I, Mirian Mo Tufford, am acting as a Education administrator for [employer]. Mike Gip, MD.   I, Kerrilyn Azbill C. Mike Gip, MD, have reviewed the above documentation for accuracy and completeness, and I agree with the above.

## 2020-07-31 ENCOUNTER — Inpatient Hospital Stay: Payer: Medicare Other | Attending: Hematology and Oncology

## 2020-07-31 ENCOUNTER — Inpatient Hospital Stay: Payer: Medicare Other

## 2020-07-31 ENCOUNTER — Inpatient Hospital Stay: Payer: Medicare Other | Attending: Hematology and Oncology | Admitting: Hematology and Oncology

## 2020-07-31 ENCOUNTER — Other Ambulatory Visit: Payer: Self-pay

## 2020-07-31 ENCOUNTER — Encounter: Payer: Self-pay | Admitting: Hematology and Oncology

## 2020-07-31 VITALS — BP 116/71 | HR 84 | Temp 97.0°F | Resp 16 | Wt 131.0 lb

## 2020-07-31 DIAGNOSIS — C3431 Malignant neoplasm of lower lobe, right bronchus or lung: Secondary | ICD-10-CM

## 2020-07-31 DIAGNOSIS — Z7901 Long term (current) use of anticoagulants: Secondary | ICD-10-CM

## 2020-07-31 DIAGNOSIS — I2782 Chronic pulmonary embolism: Secondary | ICD-10-CM

## 2020-07-31 DIAGNOSIS — F419 Anxiety disorder, unspecified: Secondary | ICD-10-CM

## 2020-07-31 DIAGNOSIS — C7951 Secondary malignant neoplasm of bone: Secondary | ICD-10-CM | POA: Diagnosis not present

## 2020-07-31 DIAGNOSIS — F329 Major depressive disorder, single episode, unspecified: Secondary | ICD-10-CM | POA: Diagnosis not present

## 2020-07-31 DIAGNOSIS — G893 Neoplasm related pain (acute) (chronic): Secondary | ICD-10-CM | POA: Diagnosis not present

## 2020-07-31 DIAGNOSIS — Z79899 Other long term (current) drug therapy: Secondary | ICD-10-CM | POA: Insufficient documentation

## 2020-07-31 DIAGNOSIS — T451X5A Adverse effect of antineoplastic and immunosuppressive drugs, initial encounter: Secondary | ICD-10-CM | POA: Diagnosis not present

## 2020-07-31 DIAGNOSIS — Z5111 Encounter for antineoplastic chemotherapy: Secondary | ICD-10-CM

## 2020-07-31 DIAGNOSIS — Z5112 Encounter for antineoplastic immunotherapy: Secondary | ICD-10-CM | POA: Diagnosis not present

## 2020-07-31 DIAGNOSIS — R112 Nausea with vomiting, unspecified: Secondary | ICD-10-CM

## 2020-07-31 DIAGNOSIS — D649 Anemia, unspecified: Secondary | ICD-10-CM | POA: Diagnosis not present

## 2020-07-31 LAB — CBC WITH DIFFERENTIAL/PLATELET
Abs Immature Granulocytes: 0.19 10*3/uL — ABNORMAL HIGH (ref 0.00–0.07)
Basophils Absolute: 0 10*3/uL (ref 0.0–0.1)
Basophils Relative: 0 %
Eosinophils Absolute: 0 10*3/uL (ref 0.0–0.5)
Eosinophils Relative: 0 %
HCT: 29.2 % — ABNORMAL LOW (ref 39.0–52.0)
Hemoglobin: 9.1 g/dL — ABNORMAL LOW (ref 13.0–17.0)
Immature Granulocytes: 1 %
Lymphocytes Relative: 5 %
Lymphs Abs: 0.6 10*3/uL — ABNORMAL LOW (ref 0.7–4.0)
MCH: 26.2 pg (ref 26.0–34.0)
MCHC: 31.2 g/dL (ref 30.0–36.0)
MCV: 84.1 fL (ref 80.0–100.0)
Monocytes Absolute: 0.8 10*3/uL (ref 0.1–1.0)
Monocytes Relative: 6 %
Neutro Abs: 12.7 10*3/uL — ABNORMAL HIGH (ref 1.7–7.7)
Neutrophils Relative %: 88 %
Platelets: 532 10*3/uL — ABNORMAL HIGH (ref 150–400)
RBC: 3.47 MIL/uL — ABNORMAL LOW (ref 4.22–5.81)
RDW: 15.7 % — ABNORMAL HIGH (ref 11.5–15.5)
WBC: 14.3 10*3/uL — ABNORMAL HIGH (ref 4.0–10.5)
nRBC: 0 % (ref 0.0–0.2)

## 2020-07-31 LAB — COMPREHENSIVE METABOLIC PANEL
ALT: 19 U/L (ref 0–44)
AST: 32 U/L (ref 15–41)
Albumin: 3.1 g/dL — ABNORMAL LOW (ref 3.5–5.0)
Alkaline Phosphatase: 86 U/L (ref 38–126)
Anion gap: 13 (ref 5–15)
BUN: 19 mg/dL (ref 8–23)
CO2: 23 mmol/L (ref 22–32)
Calcium: 9.1 mg/dL (ref 8.9–10.3)
Chloride: 102 mmol/L (ref 98–111)
Creatinine, Ser: 1.04 mg/dL (ref 0.61–1.24)
GFR, Estimated: >60 mL/min (ref >60)
Glucose, Bld: 155 mg/dL — ABNORMAL HIGH (ref 70–99)
Potassium: 4.3 mmol/L (ref 3.5–5.1)
Sodium: 138 mmol/L (ref 135–145)
Total Bilirubin: 0.4 mg/dL (ref 0.3–1.2)
Total Protein: 6.7 g/dL (ref 6.5–8.1)

## 2020-07-31 LAB — TSH: TSH: 0.457 u[IU]/mL (ref 0.350–4.500)

## 2020-07-31 LAB — MAGNESIUM: Magnesium: 2.1 mg/dL (ref 1.7–2.4)

## 2020-07-31 LAB — IRON AND TIBC
Iron: 84 ug/dL (ref 45–182)
Saturation Ratios: 29 % (ref 17.9–39.5)
TIBC: 286 ug/dL (ref 250–450)
UIBC: 202 ug/dL

## 2020-07-31 LAB — FERRITIN: Ferritin: 1309 ng/mL — ABNORMAL HIGH (ref 24–336)

## 2020-07-31 MED ORDER — PALONOSETRON HCL INJECTION 0.25 MG/5ML
0.2500 mg | Freq: Once | INTRAVENOUS | Status: AC
  Administered 2020-07-31: 0.25 mg via INTRAVENOUS
  Filled 2020-07-31: qty 5

## 2020-07-31 MED ORDER — PEMETREXED DISODIUM CHEMO INJECTION 500 MG
700.0000 mg | Freq: Once | INTRAVENOUS | Status: AC
Start: 2020-07-31 — End: 2020-07-31
  Administered 2020-07-31: 700 mg via INTRAVENOUS
  Filled 2020-07-31: qty 20

## 2020-07-31 MED ORDER — SODIUM CHLORIDE 0.9 % IV SOLN
200.0000 mg | Freq: Once | INTRAVENOUS | Status: AC
  Administered 2020-07-31: 200 mg via INTRAVENOUS
  Filled 2020-07-31: qty 8

## 2020-07-31 MED ORDER — HEPARIN SOD (PORK) LOCK FLUSH 100 UNIT/ML IV SOLN
500.0000 [IU] | Freq: Once | INTRAVENOUS | Status: AC | PRN
  Administered 2020-07-31: 500 [IU]
  Filled 2020-07-31: qty 5

## 2020-07-31 MED ORDER — SODIUM CHLORIDE 0.9 % IV SOLN
Freq: Once | INTRAVENOUS | Status: AC
  Filled 2020-07-31: qty 250

## 2020-07-31 MED ORDER — SODIUM CHLORIDE 0.9 % IV SOLN
10.0000 mg | Freq: Once | INTRAVENOUS | Status: AC
  Administered 2020-07-31: 10 mg via INTRAVENOUS
  Filled 2020-07-31: qty 1

## 2020-07-31 NOTE — Progress Notes (Signed)
Pt received prescribed treatment in clinic, pt stable at d/c. 

## 2020-08-01 ENCOUNTER — Telehealth: Payer: Self-pay

## 2020-08-01 ENCOUNTER — Inpatient Hospital Stay: Payer: Medicare Other

## 2020-08-01 LAB — T4: T4, Total: 7.8 ug/dL (ref 4.5–12.0)

## 2020-08-01 NOTE — Telephone Encounter (Signed)
Left message for patient to call back  

## 2020-08-02 ENCOUNTER — Other Ambulatory Visit: Payer: Self-pay

## 2020-08-02 ENCOUNTER — Inpatient Hospital Stay: Payer: Medicare Other

## 2020-08-02 VITALS — BP 115/84 | HR 68 | Temp 96.0°F | Resp 16

## 2020-08-02 DIAGNOSIS — Z5111 Encounter for antineoplastic chemotherapy: Secondary | ICD-10-CM | POA: Diagnosis not present

## 2020-08-02 DIAGNOSIS — C3431 Malignant neoplasm of lower lobe, right bronchus or lung: Secondary | ICD-10-CM | POA: Diagnosis not present

## 2020-08-02 DIAGNOSIS — C7951 Secondary malignant neoplasm of bone: Secondary | ICD-10-CM

## 2020-08-02 DIAGNOSIS — Z79899 Other long term (current) drug therapy: Secondary | ICD-10-CM | POA: Diagnosis not present

## 2020-08-02 MED ORDER — EPOETIN ALFA-EPBX 10000 UNIT/ML IJ SOLN
10000.0000 [IU] | Freq: Once | INTRAMUSCULAR | Status: AC
  Administered 2020-08-02: 10000 [IU] via SUBCUTANEOUS

## 2020-08-02 MED ORDER — PEGFILGRASTIM-JMDB 6 MG/0.6ML ~~LOC~~ SOSY
6.0000 mg | PREFILLED_SYRINGE | Freq: Once | SUBCUTANEOUS | Status: AC
  Administered 2020-08-02: 6 mg via SUBCUTANEOUS

## 2020-08-08 ENCOUNTER — Inpatient Hospital Stay: Payer: Medicare Other

## 2020-08-09 ENCOUNTER — Other Ambulatory Visit: Payer: Self-pay | Admitting: *Deleted

## 2020-08-09 ENCOUNTER — Telehealth: Payer: Self-pay | Admitting: Hematology and Oncology

## 2020-08-09 DIAGNOSIS — F419 Anxiety disorder, unspecified: Secondary | ICD-10-CM

## 2020-08-09 DIAGNOSIS — G893 Neoplasm related pain (acute) (chronic): Secondary | ICD-10-CM

## 2020-08-09 MED ORDER — ALPRAZOLAM 0.5 MG PO TABS: 0.5000 mg | ORAL_TABLET | Freq: Four times a day (QID) | ORAL | 0 refills | Status: DC | PRN

## 2020-08-09 MED ORDER — OXYCODONE HCL 20 MG PO TABS: ORAL_TABLET | ORAL | 0 refills | Status: DC

## 2020-08-09 NOTE — Telephone Encounter (Signed)
08/09/2020 Left VM informing pt he missed appt on 08/08/20 for B12 inj. Asked him to call back to r/s SRW

## 2020-08-13 ENCOUNTER — Telehealth: Payer: Self-pay | Admitting: Hospice and Palliative Medicine

## 2020-08-13 ENCOUNTER — Telehealth: Payer: Self-pay | Admitting: Primary Care

## 2020-08-13 DIAGNOSIS — C3431 Malignant neoplasm of lower lobe, right bronchus or lung: Secondary | ICD-10-CM

## 2020-08-13 MED ORDER — OLANZAPINE 5 MG PO TABS: 5.0000 mg | ORAL_TABLET | Freq: Every day | ORAL | 3 refills | Status: DC

## 2020-08-13 NOTE — Telephone Encounter (Signed)
Called patient to schedule Palliiative Consult, no answer - left message requesting a return call to schedule visit.    I also called patient's friend, [name], and talked with her and let her know that I was trying to get in touch with him and I explained Palliative services briefly with her and asked her to let patient know that I was trying to get in touch with him.  She does not live with him but talks with him on a regular basis

## 2020-08-13 NOTE — Telephone Encounter (Signed)
Received a call from patient.  He was tearful and described worsening depression over the past days to weeks.  He says that he lacks energy and is mostly spending each day in bed.  He is not sleeping well at night.  He is sleeping more during the day.  He is hopeless and says that he has considered discontinuing cancer treatment and just letting nature take its course.  He denies SI/HI.  Patient has been previously referred to psychiatry, home palliative care, and counseling but has declined each of those intern.  We discussed this again today and patient with more agreeable to help.  Will refer back to psychiatry but see if they can perhaps do a virtual visit to assist with care.  We will also refer to home palliative care and see if grief counselors can follow through [facility].   Patient is on max dose of fluoxetine.  Additionally, he takes alprazolam 0.5 mg 4 times daily.  We will start him on low-dose olanzapine until he can be followed and managed by psychiatry.  Plan: -Continue fluoxetine 40 mg daily -Continue alprazolam 0.5 mg 4 times daily as needed -Start olanzapine 5 mg nightly -Referrals to psychiatry, home palliative care, and counseling -Follow-up virtual visit with me next week and then will see Dr. Mike Gip the following week

## 2020-08-13 NOTE — Telephone Encounter (Signed)
  Thanks you for helping him get into psychiatry.  I hope he will follow through.  M

## 2020-08-14 ENCOUNTER — Telehealth: Payer: Self-pay | Admitting: Primary Care

## 2020-08-14 NOTE — Telephone Encounter (Signed)
Spoke with patient regarding the Palliative referral & services and all questions were answered and he was in agreement with scheduling visit with NP.  I have scheduled an In-person Consult for 08/21/20 @ 9:30 AM.

## 2020-08-17 ENCOUNTER — Telehealth: Payer: Self-pay | Admitting: Primary Care

## 2020-08-17 NOTE — Telephone Encounter (Signed)
Spoke with patient to let him know that Palliative NP had a meeting prior to his appointment on 08/21/20 and that she would be there between 9:30 - 10:30 to see him.  Patient said that he needed to reschedule this appointment anyway.  We have rescheduled the Palliative Consult to 08/30/20 @ 12 Noon.

## 2020-08-17 NOTE — Telephone Encounter (Signed)
Phone call made to [facility] to follow up on referral. Message left on nurse line to call back regarding referral and if any appts have been scheduled for patient.

## 2020-08-21 ENCOUNTER — Other Ambulatory Visit: Payer: Medicare Other | Admitting: Primary Care

## 2020-08-22 ENCOUNTER — Inpatient Hospital Stay: Payer: Medicare Other | Attending: Hospice and Palliative Medicine | Admitting: Hospice and Palliative Medicine

## 2020-08-22 ENCOUNTER — Other Ambulatory Visit: Payer: Self-pay | Admitting: *Deleted

## 2020-08-22 DIAGNOSIS — F419 Anxiety disorder, unspecified: Secondary | ICD-10-CM

## 2020-08-22 DIAGNOSIS — Z515 Encounter for palliative care: Secondary | ICD-10-CM | POA: Diagnosis not present

## 2020-08-22 DIAGNOSIS — C3431 Malignant neoplasm of lower lobe, right bronchus or lung: Secondary | ICD-10-CM | POA: Diagnosis not present

## 2020-08-22 DIAGNOSIS — G893 Neoplasm related pain (acute) (chronic): Secondary | ICD-10-CM | POA: Diagnosis not present

## 2020-08-22 DIAGNOSIS — F329 Major depressive disorder, single episode, unspecified: Secondary | ICD-10-CM | POA: Diagnosis not present

## 2020-08-22 MED ORDER — OXYCODONE HCL 20 MG PO TABS: 20.0000 mg | ORAL_TABLET | ORAL | 0 refills | Status: DC | PRN

## 2020-08-22 MED ORDER — ALPRAZOLAM 0.5 MG PO TABS: 0.5000 mg | ORAL_TABLET | Freq: Four times a day (QID) | ORAL | 0 refills | Status: DC | PRN

## 2020-08-22 NOTE — Telephone Encounter (Signed)
Spoke with receptionist at [facility]. States that pt does not have any appts scheduled at this time. Requested to resend referral by fax to [phone]. Pt will be scheduled for virtual visit once received.   Urgent referral has been faxed to the above fax number as requested.

## 2020-08-22 NOTE — Progress Notes (Signed)
Virtual Visit via Telephone Note  I connected with Jimmy West on 08/22/20 at  9:30 AM EST by telephone and verified that I am speaking with the correct person using two identifiers.   I discussed the limitations, risks, security and privacy concerns of performing an evaluation and management service by telephone and the availability of in person appointments. I also discussed with the patient that there may be a patient responsible charge related to this service. The patient expressed understanding and agreed to proceed.   History of Present Illness: Mr. 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 Patient has had chronic neoplasm related pain and severe anxiety/depression.  He was referred to palliative care clinic to assist with symptom management and to establish treatment goals.   Observations/Objective: Patient reports that he continues to have good days and bad.  His overall mood is slightly improved from previous conversation.  Patient reports that the olanzapine is helping him sleep.  He has not yet heard from psychiatry practice about appointment.  Will have nurse navigator follow-up today.  Patient does feel that the oxycodone has become less effective over time in managing the quality of his pain.  Pain is still intermittent.  Patient was previously tried on a long-acting opioid but kept forgetting to take it consistently.  On average, patient reports that he takes oxycodone about 4 times a day.  We will liberalize dose of oxycodone to 1 to 1-1/2 tablets (20 to 30 mg) every 4 hours as needed.  Patient denies other significant changes or concerns.  He has follow-up next week with Dr. Mike Gip.  Assessment and Plan: Stage IV lung cancer -on treatment with immunotherapy.  Followed by Dr. Mike Gip  Neoplasm related pain -increase oxycodone 20 to 30 mg every 4 hours as needed for breakthrough  pain #60  Anxiety -continue alprazolam as needed  Depression -continue Prozac/olanzapine  Constipation -continue daily senna and MiraLAX  PDMP reviewed  Follow Up Instructions: Follow-up MyChart visit 1-2 months   I discussed the assessment and treatment plan with the patient. The patient was provided an opportunity to ask questions and all were answered. The patient agreed with the plan and demonstrated an understanding of the instructions.   The patient was advised to call back or seek an in-person evaluation if the symptoms worsen or if the condition fails to improve as anticipated.  I provided 10 minutes of non-face-to-face time during this encounter.   Irean Hong, NP

## 2020-08-22 NOTE — Telephone Encounter (Signed)
Pt requests refill of xanax. States he forgot to mention during video visit today.

## 2020-08-27 NOTE — Progress Notes (Signed)
[facility]  [address] [place], [place] [address] Phone: [phone]  Fax: [phone]   Clinic Day: 08/28/20  Referring physician: [name], NP  Chief Complaint: Jimmy West is a 70 y.o. male with metastatic adenocarcinoma of the lung who is seen for assessment prior to cycle #21 Alimta and pembrolizumab.  HPI: The patient was last seen in the medical oncology clinic on 08/01/2019. At that time, he felt "pretty good." He had one episode of nausea and vomiting.  Back pain was similar.  He denied any increased shortness of breath. Exam was stable. Hematocrit was 29.2, hemoglobin 9.1, MCV 84.1, platelets 532,000, WBC 14,300. Ferritin was 1,309 with an iron saturation of 29% and a TIBC of 286. TSH was 0.457 and total T4 was 7.8. He received cycle #20 Alimta and pembroluzimab.  The patient received Fulphila and Retacrit 10,000 units on 08/02/2020.  The patient spoke to Billey Chang, NP by phone on 08/22/2020. His mood had slightly improved. He felt that the oxycodone had become less effective over time. He had tried a long-acting opiood in the past but forgot to take it consistently. His oxycodone dose was increased to 1 to 1-1/2 tablets (20 to 30 mg) every 4 hours prn. Follow-up was planned for 1-2 months.  During the interim, he has felt "fine".  He is breathing better.  He describes not much pain.  Pain is predominantly in his upper back.  He has a bulging disc lower down in his back.  He is eating better.  He describes intermittent nausea which comes "out of nowhere".  Mood is good.   Past Medical History:  Diagnosis Date  . Bulging lumbar disc   . Cancer ([place])    stage 4 lung cancer  . Heart attack [facility])     Past Surgical History:  Procedure Laterality Date  . CARDIAC CATHETERIZATION     two stents  . KNEE SURGERY Left   . PORTA CATH INSERTION N/A 05/21/2018   Procedure: PORTA CATH INSERTION;  Surgeon: Algernon Huxley, MD;   Location: [place] [facility];  Service: Cardiovascular;  Laterality: N/A;    Family History  Problem Relation Age of Onset  . Heart failure Father   . Cancer Maternal Aunt   . Heart failure Maternal Uncle   . Dementia Paternal Grandmother   . Cancer Maternal Aunt     Social History:  reports that he quit smoking about 16 years ago. His smoking use included cigarettes. He has a 22.50 pack-year smoking history. He has never used smokeless tobacco. He reports previous alcohol use. He reports that he does not use drugs. He started smoking at age 88. He is smoking 1 1/2 packs/day. Patient denies known exposures to radiation ortoxins. 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 He has not been working recently as the salon was closed. He plays golf occasionally (none recently).He has 8 cats. The patient is alonetoday.   Allergies: No Known Allergies  Current Medications: Current Outpatient Medications  Medication Sig Dispense Refill  . albuterol (VENTOLIN HFA) 108 (90 Base) MCG/ACT inhaler Inhale 2 puffs into the lungs every 6 (six) hours as needed for wheezing or shortness of breath. 18 g 3  . ALPRAZolam (XANAX) 0.5 MG tablet Take 1 tablet (0.5 mg total) by mouth 4 (four) times daily as needed for anxiety. (Patient taking differently: Take 0.5 mg by mouth as needed for anxiety.) 60 tablet 0  . Calcium  600-400 MG-UNIT CHEW Chew 2 tablets by mouth daily.    Marland Kitchen ELIQUIS 5 MG TABS tablet TAKE 1 TABLET BY MOUTH TWICE DAILY 60 tablet 5  . FLUoxetine (PROZAC) 40 MG capsule Take 1 capsule (40 mg total) by mouth daily. 30 capsule 3  . folic acid (FOLVITE) 1 MG tablet Take 1 tablet (1 mg total) by mouth daily. Continue until 21 days after Alimta completed. 30 tablet 3  . OLANZapine (ZYPREXA) 5 MG tablet Take 1 tablet (5 mg total) by mouth at bedtime. 30 tablet 3  . ondansetron (ZOFRAN-ODT) 8 MG disintegrating tablet DISSOLVE 1 TABLET ON TONGUE EVERY 8 HOURS AS NEEDED FOR NAUSEA  OR VOMITING 20 tablet 1  . Oxycodone HCl 20 MG TABS Take 1-1.5 tablets (20-30 mg total) by mouth every 4 (four) hours as needed. 60 tablet 0  . polyethylene glycol (MIRALAX / GLYCOLAX) packet Take 17 g by mouth daily.    Marland Kitchen senna (SENOKOT) 8.6 MG tablet Take 1 tablet by mouth as needed.     Marland Kitchen acetaminophen (TYLENOL) 500 MG tablet Take 500 mg by mouth 3 (three) times daily as needed.  (Patient not taking: Reported on 08/28/2020)    . prochlorperazine (COMPAZINE) 10 MG tablet Take 1 tablet (10 mg total) by mouth every 6 (six) hours as needed for nausea or vomiting. (Patient not taking: Reported on 08/28/2020) 30 tablet 0   No current facility-administered medications for this visit.   Facility-Administered Medications Ordered in Other Visits  Medication Dose Route Frequency Provider Last Rate Last Admin  . 0.9 %  sodium chloride infusion   Intravenous Once Taiquan Campanaro C, MD      . 0.9 %  sodium chloride infusion   Intravenous Once PRN Dresden Lozito C, MD      . albuterol (PROVENTIL) (2.5 MG/3ML) 0.083% nebulizer solution 2.5 mg  2.5 mg Nebulization Once PRN Faithanne Verret C, MD      . alteplase (CATHFLO ACTIVASE) injection 2 mg  2 mg Intracatheter Once PRN Lequita Asal, MD      . EPINEPHrine (ADRENALIN) 1 MG/10ML injection 0.25 mg  0.25 mg Intravenous Once PRN Mekiah Wahler C, MD      . EPINEPHrine (ADRENALIN) 1 MG/10ML injection 0.25 mg  0.25 mg Intravenous Once PRN Avery Eustice C, MD      . heparin lock flush 100 unit/mL  500 Units Intracatheter Once PRN Mike Gip, Faheem Ziemann C, MD      . heparin lock flush 100 unit/mL  250 Units Intracatheter Once PRN Nolon Stalls C, MD      . sodium chloride flush (NS) 0.9 % injection 10 mL  10 mL Intravenous PRN Nolon Stalls C, MD   10 mL at 03/04/19 0959  . sodium chloride flush (NS) 0.9 % injection 10 mL  10 mL Intracatheter Once PRN Kessler Kopinski C, MD      . sodium chloride flush (NS) 0.9 % injection 3 mL  3 mL  Intracatheter Once PRN Lequita Asal, MD        Review of Systems  Constitutional: Negative for chills, diaphoresis, fever, malaise/fatigue and weight loss (up 2 lbs).       Feels "fine."  He has more energy.  HENT: Negative.  Negative for congestion, ear discharge, ear pain, hearing loss, nosebleeds, sinus pain, sore throat and tinnitus.   Eyes: Negative.  Negative for blurred vision and double vision.  Respiratory: Positive for shortness of breath (on exertion). Negative for cough, hemoptysis and sputum production.  Cardiovascular: Negative.  Negative for chest pain, palpitations, orthopnea and leg swelling.  Gastrointestinal: Negative for abdominal pain, blood in stool, constipation, diarrhea, heartburn, melena, nausea and vomiting.       Eating better.  Genitourinary: Negative.  Negative for dysuria, frequency, hematuria and urgency.  Musculoskeletal: Positive for back pain (upper back). Negative for falls, joint pain, myalgias and neck pain.       Pain due to bulging disk.  Skin: Negative.  Negative for itching and rash.  Neurological: Negative.  Negative for dizziness, tingling, sensory change, focal weakness, weakness and headaches.  Endo/Heme/Allergies: Negative for environmental allergies and polydipsia. Bruises/bleeds easily.  Psychiatric/Behavioral: Positive for depression (mood is good). Negative for memory loss. The patient is nervous/anxious (on Xanax). The patient does not have insomnia.   All other systems reviewed and are negative.  Performance status (ECOG): 1  Vitals Blood pressure 138/71, pulse 70, temperature 98.1 F (36.7 C), temperature source Oral, resp. rate 16, weight 132 lb 13.2 oz (60.2 kg), SpO2 100 %.   Physical Exam Vitals and nursing note reviewed.  Constitutional:      General: He is not in acute distress.    Appearance: He is well-developed. He is not diaphoretic.     Interventions: Face mask in place.  HENT:     Head: Normocephalic and  atraumatic.     Comments: Thin brown graying hair. Mustache.    Mouth/Throat:     Mouth: Mucous membranes are moist.     Pharynx: Oropharynx is clear.  Eyes:     General: No scleral icterus.    Extraocular Movements: Extraocular movements intact.     Conjunctiva/sclera: Conjunctivae normal.     Pupils: Pupils are equal, round, and reactive to light.     Comments: Glasses.  Gray/blue eyes.  Cardiovascular:     Rate and Rhythm: Normal rate and regular rhythm.     Heart sounds: Normal heart sounds. No murmur heard.   Pulmonary:     Effort: Pulmonary effort is normal. No respiratory distress.     Breath sounds: Normal breath sounds. No wheezing or rales.  Chest:     Chest wall: No tenderness.  Breasts:     Right: No axillary adenopathy or supraclavicular adenopathy.     Left: No axillary adenopathy or supraclavicular adenopathy.    Abdominal:     General: Bowel sounds are normal. There is no distension.     Palpations: Abdomen is soft. There is no mass.     Tenderness: There is no abdominal tenderness. There is no guarding or rebound.  Musculoskeletal:        General: No swelling or tenderness. Normal range of motion.     Cervical back: Normal range of motion and neck supple.  Lymphadenopathy:     Head:     Right side of head: No preauricular, posterior auricular or occipital adenopathy.     Left side of head: No preauricular, posterior auricular or occipital adenopathy.     Cervical: No cervical adenopathy.     Upper Body:     Right upper body: No supraclavicular or axillary adenopathy.     Left upper body: No supraclavicular or axillary adenopathy.     Lower Body: No right inguinal adenopathy. No left inguinal adenopathy.  Skin:    General: Skin is warm and dry.  Neurological:     Mental Status: He is alert and oriented to person, place, and time. Mental status is at baseline.  Psychiatric:  Behavior: Behavior normal.        Thought Content: Thought content normal.         Judgment: Judgment normal.    Imaging studies: 04/30/2018:  PET scanrevealed a 3.4 cm hypermetabolic RLL pulmonary mass (SUV 13.2), 11 mm pulmonary nodule in the LEFT lung (SUV 4.5), RIGHT paratracheal lymph node (SUV 5.1), and hypermetabolic activity within the T4 vertebral body (SUV 10.2).  05/03/2018:  Thoracic spine MRIon 05/03/2018 revealed T4 metastasiswith a 40% pathologic compression deformity and 5 mm of retropulsion of the vertebral body. Retropulsion results in mild spinal canal stenosis and mild bilateral C4-5 foraminal stenosis. There was paravertebral soft tissue thickening from mid T3 to mid T5, likely representing edema related to the pathologic compression deformity vs. possible extraosseous extension of the neoplasm. There were no additional thoracic spinal metastases noted.  05/03/2018:  Head MRIrevealed no intracranial metastatic disease. 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 Incidental mention made of mild paranasal sinus disease. 01/10/2019:  Cervical and thoracic spine CT at [facility] unchanged severe compression deformity/vertebral plana of T4 with a stable degree of retropulsion and focal mild spinal canal stenosis. There was interval enlargement of a right lower lobe pulmonary mass(4.5 x 3.5 cm compared to 3.9 x 2.9 cm)with redemonstrated spiculated pulmonary nodules. 01/28/2019:  Chest, abdomen, pelvisCTrevealed slight interval enlargement of a right lower lobe mass with central necrosis measuring 4.6 x 3.4 cm, previously 4.0 x 3.0 cm. There was no change in right upper lobe nodules(1.4 and 0.8 cm).Therewas almost no residua of left lung nodules, with irregular opacities in the apical left upper lobeandsuperior segment left lower lobe. There was no change in right hilar soft  tissue and lymph nodes.There wasvertebra plana deformity of T4andno evidence of new osseous metastatic disease.There was no evidence of distant metastatic disease in the abdomen or pelvis.The1.6 x 1.1 cmleft adrenal nodule(non-metabolic on prior PET scan),was unchanged. 03/24/2019:  Renal ultrasoundrevealed no acute abnormality identified.There was no hydronephrosis or bladder distention. 04/23/2019:  Abdomen and pelvis CT revealed no acute abdominal or pelvic pathology. There was fluid in the colon which can be seen with diarrhea. There was diverticulosis without evidence of diverticulitis. There was a stable 1.5 cm left adrenal nodule. 05/06/2019:  Chest, abdomen, and pelvisCTrevealed a positive response to interval therapy for the known right lower lobe lung carcinoma(4.6 x 3.4 cm to3.1 x 2.8 cm transversely). There hadbeen a mild interval decrease contiguous soft tissue that extends along the right hilum. The two right upper lobe noduleswerestable from the most recent prior study (smaller than 04/27/2018).There are no new lung nodules.Therewasno evidence of new metastatic disease.There was stable severe compression deformity/vertebra plana of T4.Therewasno evidence of other osseous metastatic disease.There was a stable left adrenal nodule consistent with an adenoma. 05/30/2019:  ChestCT angiogramrevealed a tiny subsegmental pulmonary embolusin the right lower lobe. There areas of ground-glass at the periphery of the right chest hadbecome more conspicuous. This mayrelate to mild pneumonitis, perhaps due to radiation, attention on follow-up.The dominant nodule in the right lung basewasstable in size.  08/14/2019:  Cervical spine MRIrevealed no evidence of metastatic disease within the cervical spine. 09/16/2019:  Chest, abdomen and pelvis CTrevealed asimilar-appearing spiculated right lower lobe nodule.There was no evidence for metastatic disease in the  abdomen or pelvis.There was stable left adrenal adenoma. 12/15/2019:  Chest CT revealed an enlarging right lower lobe mass  c/w malignancy (3.0 x 2.4 cm to 3.3 x 2.7 cm).  There was slight enlargement in a spiculated right upper lobe subpleural nodule (0.8 x 1.5 cm to 1.2 x 0.7 cm). 01/16/2020:  Bone scan revealed no scintigraphic evidence of osseous metastatic disease. 03/21/2020:  Chest CT angiogram revealed no evidence of pulmonary embolism.  A rounded spiculated mass in the RUL measuring 3.4 x 3 cm which was slightly enlarged with adjacent pleural thickening.  There was a stable 1.4 x 0.6 cm irregular subpleural density in the RUL.  There was stable severe compression deformity in the upper thoracic vertebral body consistent with an old fracture. 06/26/2020:  Head CT with or without contrast revealed no evidence of intracranial metastasis. 06/26/2020:  Chest, abdomen, and pelvis CT with contrast revealed a 3.1 x 2.4 cm (previously 3.4 x 3.0 cm) spiculated mass in the superior segment of the RLL.  There was a stable 1.4 x 0.7 cm spiculated subpleural nodule in the posterolateral RUL.  There was a stable high grade vertebral plana deformity of the T4 vertebral body.  There was no evidence of metastatic disease in the abdomen or pelvis.  There was mild stable splenomegaly (13.6 cm). 06/26/2020:  Bone scan revealed no evidence of metastatic disease. 07/05/2020:  PET scan revealed a 3.1 x 2.5 cm mass within superior segment of the RLL with intense FDG uptake (SUV 1.61) c/w metabolically active tumor. Additionally, there were 2 small nodules within the posterior RUL which were FDG avid (1 cm ; SUV 3.84) concerning for additional sites of disease. There was a geographic, curvilinear bandlike area of increased uptake within the posterior left lung base that corresponded to an area of airspace and ground-glass attenuation. The geographic distribution of this finding favored set inflammatory or infectious  process.   Infusion on 08/28/2020  Component Date Value Ref Range Status  . WBC 08/28/2020 10.1  4.0 - 10.5 K/uL Final  . RBC 08/28/2020 3.38* 4.22 - 5.81 MIL/uL Final  . Hemoglobin 08/28/2020 9.0* 13.0 - 17.0 g/dL Final  . HCT 08/28/2020 28.9* 39.0 - 52.0 % Final  . MCV 08/28/2020 85.5  80.0 - 100.0 fL Final  . MCH 08/28/2020 26.6  26.0 - 34.0 pg Final  . MCHC 08/28/2020 31.1  30.0 - 36.0 g/dL Final  . RDW 08/28/2020 19.3* 11.5 - 15.5 % Final  . Platelets 08/28/2020 369  150 - 400 K/uL Final  . nRBC 08/28/2020 0.0  0.0 - 0.2 % Final  . Neutrophils Relative % 08/28/2020 78  % Final  . Neutro Abs 08/28/2020 7.9* 1.7 - 7.7 K/uL Final  . Lymphocytes Relative 08/28/2020 11  % Final  . Lymphs Abs 08/28/2020 1.1  0.7 - 4.0 K/uL Final  . Monocytes Relative 08/28/2020 10  % Final  . Monocytes Absolute 08/28/2020 1.0  0.1 - 1.0 K/uL Final  . Eosinophils Relative 08/28/2020 0  % Final  . Eosinophils Absolute 08/28/2020 0.0  0.0 - 0.5 K/uL Final  . Basophils Relative 08/28/2020 0  % Final  . Basophils Absolute 08/28/2020 0.0  0.0 - 0.1 K/uL Final  . Immature Granulocytes 08/28/2020 1  % Final  . Abs Immature Granulocytes 08/28/2020 0.06  0.00 - 0.07 K/uL Final   Performed at [facility], [address] [place], [place] [address]  . Sodium 08/28/2020 138  135 - 145 mmol/L Final  . Potassium 08/28/2020 3.7  3.5 - 5.1 mmol/L Final  . Chloride 08/28/2020 103  98 - 111 mmol/L Final  .  CO2 08/28/2020 22  22 - 32 mmol/L Final  . Glucose, Bld 08/28/2020 101* 70 - 99 mg/dL Final   Glucose reference range applies only to samples taken after fasting for at least 8 hours.  . BUN 08/28/2020 13  8 - 23 mg/dL Final  . Creatinine, Ser 08/28/2020 0.91  0.61 - 1.24 mg/dL Final  . Calcium 08/28/2020 8.6* 8.9 - 10.3 mg/dL Final  . Total Protein 08/28/2020 6.7  6.5 - 8.1 g/dL Final  . Albumin 08/28/2020 3.1* 3.5 - 5.0 g/dL Final  . AST 08/28/2020 53* 15 - 41 U/L Final  . ALT 08/28/2020 23  0 -  44 U/L Final  . Alkaline Phosphatase 08/28/2020 99  38 - 126 U/L Final  . Total Bilirubin 08/28/2020 0.5  0.3 - 1.2 mg/dL Final  . GFR, Estimated 08/28/2020 >60  >60 mL/min Final   Comment: (NOTE) Calculated using the CKD-EPI Creatinine Equation (2021)   . Anion gap 08/28/2020 13  5 - 15 Final   Performed at [facility], [address] [place], [place] [address]  . Magnesium 08/28/2020 2.0  1.7 - 2.4 mg/dL Final   Performed at [facility], [address] [place], [place] [address]    Assessment:  Jimmy West is a 70 y.o. male with metastatichigh-grade adenocarcinomaof the right lungs/p CT-guided biopsy of a RLL lung mass on 05/11/2018. Pathologyrevealed an invasive high-grade adenocarcinoma, with predominantly solid growth pattern. The neoplastic cells were TTF-1 (+), Napsin A (+), and P40 (+). He has a T4 vertebral metastasis. Clinical stage is T4N1M1.  There was not enough material for Foundation One testing. PDL-1revealed TPS 90%.  PET scanon 04/30/2018 revealed a 3.4 cm hypermetabolic RLL pulmonary mass (SUV 13.2), 11 mm pulmonary nodule in the LEFT lung (SUV 4.5), RIGHT paratracheal lymph node (SUV 5.1), and hypermetabolic activity within the T4 vertebral body (SUV 10.2).   Hereceived 11cycles ofpembrolizumab(05/24/2018 - 02/07/2019). He toleratedtreatment well. CEAwas 1.3 on 08/30/2018.LDHwas 165 on 11/29/2018.  He completed T4 radiationon 07/07/2018. He receives Xgevamonthly (06/03/2018 -03/12/2020).  He received 1 cycle of carboplatin, Alimta, and pembrolizumabon 02/08/2019. Cycle #1 was complicated by nausea, vomiting, and diarrhea necessitating hospitalization.Decision made to hold carboplatin with his second dose.He iss/p2cycles ofAlimta and pembrolizumab (02/28/2019 - 03/29/2019).Cycle #2 was complicated by nausea, vomiting, and dehydration requring fluids in clinic and an overnight stay in the  hospital. Hereceivedcarboplatin (AUC 2), Alimta, and pebrolizumab(04/18/2019).He tolerated it poorly.   Hehasreceived20 cycles ofAlimta and pembrolizumabon (05/09/2019 -04/11/2020; 07/11/2020- 07/31/2020). He began Fulphila with cycle #14 secondary to neutropenia.  He has tolerated chemotherapy well. He receives B12every 9 weeks (last12/22/2021).  Chest CT angiogram on 03/21/2020 revealed no evidence of pulmonary embolism.  A rounded spiculated 3.4 x 3 cm mass in the RUL was slightly enlarged with adjacent pleural thickening.  There was a stable 1.4 x 0.6 cm irregular subpleural density in the RUL.  There was stable severe compression deformity in the upper thoracic vertebral body consistent with an old fracture.  CT guided right lower lobe lung biopsy on 05/09/2020 revealed non-small cell carcinoma in a background of significant necrosis. Immunohistochemical studies showed strong staining for CK7, both in viable tumor cells and necrotic debris. There was patchy marking for p40 and p63. TTF-1 and Napsin-A were negative.  This may represent potential transformation to a carcinoma with squamous differentiation or the original biopsy could have represented the glandular component of an adenosquamous carcinoma.  Foundation One on 05/09/2020 revealed no reportable alterations.  Microsatellite  status was stable, tumor mutational burden 5 Muts/MB, CDKN2A loss, CDKN2B loss, KRAS G12D, MTAP loss, NF2 E442*, STK11 P203 fs*84 and TP53 Y205S.  There were no reportable alterations in ALK, BRAF, EGFR, ERBB2, MET, RET, and ROS1.  Head CT on 06/26/2020 revealed no evidence of intracranial metastasis.  Chest, abdomen, and pelvis CT on 06/26/2020 revealed a 3.1 x 2.4 cm (previously 3.4 x 3.0 cm) spiculated mass in the superior segment of the RLL.  There was a stable 1.4 x 0.7 cm spiculated subpleural nodule in the posterolateral RUL.  There was a stable high grade vertebral plana deformity of the T4  vertebral body.  There was no evidence of metastatic disease in the abdomen or pelvis.  There was mild stable splenomegaly (13.6 cm).  Bone scan on 06/26/2020 revealed no evidence of metastatic disease.  PET scan on 07/05/2020 revealed a 3.1 x 2.5 cm mass within superior segment of the RLL with intense FDG uptake (SUV 2.62) c/w metabolically active tumor. Additionally, there were 2 small nodules within the posterior RUL which were FDG avid (1 cm ; SUV 3.84) concerning for additional sites of disease. There was a geographic, curvilinear bandlike area of increased uptake within the posterior left lung base that corresponded to an area of airspace and ground-glass attenuation. The geographic distribution of this finding favored set inflammatory or infectious process.  He hascancer-related painin T4. He is off the [employer].  He is taking oxycodone10-36m every 4 hours prn.  He has cervicalgia.Cervical spine MRIon 08/14/2019 showed no evidence of metastatic disease within the cervical spine. Heis followedby Dr YIzora Ribas  He has chemotherapy induced anemia.  He received Retacrit on 11/01/2019 (last 04/09/2020).  Hehad transient renal insufficiencyon 03/23/2019. Creatinine was 1.32 (baseline 0.61-1.0). Renal ultrasoundon 03/24/2019 revealed no acute abnormality identified.There was no hydronephrosis or bladder distention.Creatinine is 0.82 today.  Stool waspositive for C difficile + diarrheaon 06/14/2019.Hewas treated with oral vancomycin.  He received the high dose influenza vaccine on 06/11/2020  Symptomatically, feels fine.  He denies shortness of breath.  Pain is minimal.  Mood is good.  He has gained 2 pounds.  Exam is stable.  Plan: 1.   Labs: CBC with diff, CMP, Mg, TSH. 2. Metastatic high-grade adenocarcinoma the RIGHT lung He is s/p 11 cycles of pembrolizumab. He is s/p 1 cycle of carboplatin, Almita, and pembrolizumab (02/08/2019). He is s/p 1 cycle of  carboplatin (AUC 2), Alimta, and pembrolizumab (04/18/2019). He is s/p18cycles of Alimta and pembrolizumab (02/28/2019 - 03/29/2019; 05/09/2019-04/11/2020). Chest CT angiogram on 09/01/2021revealed slight growth in the RUL mass.  CT guided RLL lung biopsy on 05/09/2020 revealed potential transformation to squamous differentiation.   Foundation One revealed no driver mutations.     He is intolerant to carboplatin at an AUC of 5 as well as 2.  PET scan on 07/05/2020 revealed no evidence of disease progression.   PET scan documented stable disease as compared to images from 08/2019  He is s/p cycle #20 Alimta and pembrolizumab on 07/31/2020 with Fulphila support.  Plan:  Continuation of Alimta and pembrolizumab based on stability of disease and tolerance of treatment.   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   At some point, his disease will progress and require Taxotere.  Patient would like to postpone until necessary.   He requires Fulphila support secondary to neutropenia at nadir  Labs reviewed.  Cycle #21 Alimta and pembrolizumab today.  Discuss symptom management.  He has antiemetics and pain medications at home to use on a prn bases.  Interventions are adequate.  3. Bone metastasis Symptomatically, pain is well controlled.  Bone scan on 06/26/2020 revealed no evidence of metastatic disease.  PET scan on 07/05/2020 revealed no evidence of metastatic disease.  He last received Xgeva on 03/12/2020.  Delton See will be reinstituted if progressive bone disease is documented. 4.Anxiety and depression Clinically, he is doing extremely well.   He remains on low-dose Xanax as well as Prozac.  Continue to monitor. 5.Pulmonary embolism Continue Eliquis. 6.   Normocytic anemia  Hematocrit 28.9.  Hemoglobin 0.0.  MCV 85.5 today.  He henies any bleeding.   Diet is good.  He has chemotherapy-induced anemia.  Ferritin was 1309 (AFR) with an iron saturation of 29% and a TIBC of 286 on 07/31/2020.  Retacrit today. 7.   Cycle #21 Alimta and pembrolizumab today. 8.   Ensure patient scheduled for B12 every 9 weeks. 9.   RTC tomorrow for Fulphila. 10.   RTC in 3 weeks for MD assessment, labs (CBC with diff, CMP, Mg), and cycle #22 Alimta and pembrolizumab.  I discussed the assessment and treatment plan with the patient.  The patient was provided an opportunity to ask questions and all were answered.  The patient agreed with the plan and demonstrated an understanding of the instructions.  The patient was advised to call back if the symptoms worsen or if the condition fails to improve as anticipated.   Lequita Asal, MD, PhD 08/28/2020 ; 9:11 AM

## 2020-08-28 ENCOUNTER — Inpatient Hospital Stay: Payer: Medicare Other

## 2020-08-28 ENCOUNTER — Inpatient Hospital Stay: Payer: Medicare Other | Attending: Hematology and Oncology

## 2020-08-28 ENCOUNTER — Encounter: Payer: Self-pay | Admitting: Hematology and Oncology

## 2020-08-28 ENCOUNTER — Telehealth: Payer: Self-pay | Admitting: Primary Care

## 2020-08-28 ENCOUNTER — Other Ambulatory Visit: Payer: Self-pay

## 2020-08-28 ENCOUNTER — Inpatient Hospital Stay (HOSPITAL_BASED_OUTPATIENT_CLINIC_OR_DEPARTMENT_OTHER): Payer: Medicare Other | Admitting: Hematology and Oncology

## 2020-08-28 VITALS — BP 138/71 | HR 70 | Temp 98.1°F | Resp 16 | Wt 132.8 lb

## 2020-08-28 DIAGNOSIS — Z5111 Encounter for antineoplastic chemotherapy: Secondary | ICD-10-CM | POA: Insufficient documentation

## 2020-08-28 DIAGNOSIS — Z5112 Encounter for antineoplastic immunotherapy: Secondary | ICD-10-CM | POA: Insufficient documentation

## 2020-08-28 DIAGNOSIS — D6481 Anemia due to antineoplastic chemotherapy: Secondary | ICD-10-CM | POA: Diagnosis not present

## 2020-08-28 DIAGNOSIS — Z5189 Encounter for other specified aftercare: Secondary | ICD-10-CM | POA: Insufficient documentation

## 2020-08-28 DIAGNOSIS — I252 Old myocardial infarction: Secondary | ICD-10-CM | POA: Insufficient documentation

## 2020-08-28 DIAGNOSIS — F17210 Nicotine dependence, cigarettes, uncomplicated: Secondary | ICD-10-CM | POA: Diagnosis not present

## 2020-08-28 DIAGNOSIS — D701 Agranulocytosis secondary to cancer chemotherapy: Secondary | ICD-10-CM | POA: Diagnosis not present

## 2020-08-28 DIAGNOSIS — T451X5A Adverse effect of antineoplastic and immunosuppressive drugs, initial encounter: Secondary | ICD-10-CM

## 2020-08-28 DIAGNOSIS — G893 Neoplasm related pain (acute) (chronic): Secondary | ICD-10-CM

## 2020-08-28 DIAGNOSIS — Z8249 Family history of ischemic heart disease and other diseases of the circulatory system: Secondary | ICD-10-CM | POA: Insufficient documentation

## 2020-08-28 DIAGNOSIS — C7951 Secondary malignant neoplasm of bone: Secondary | ICD-10-CM | POA: Diagnosis not present

## 2020-08-28 DIAGNOSIS — F32A Depression, unspecified: Secondary | ICD-10-CM | POA: Diagnosis not present

## 2020-08-28 DIAGNOSIS — Z7901 Long term (current) use of anticoagulants: Secondary | ICD-10-CM | POA: Diagnosis not present

## 2020-08-28 DIAGNOSIS — C3431 Malignant neoplasm of lower lobe, right bronchus or lung: Secondary | ICD-10-CM

## 2020-08-28 DIAGNOSIS — R112 Nausea with vomiting, unspecified: Secondary | ICD-10-CM

## 2020-08-28 DIAGNOSIS — F419 Anxiety disorder, unspecified: Secondary | ICD-10-CM | POA: Insufficient documentation

## 2020-08-28 DIAGNOSIS — C3432 Malignant neoplasm of lower lobe, left bronchus or lung: Secondary | ICD-10-CM | POA: Diagnosis not present

## 2020-08-28 DIAGNOSIS — Z79899 Other long term (current) drug therapy: Secondary | ICD-10-CM | POA: Diagnosis not present

## 2020-08-28 DIAGNOSIS — I2782 Chronic pulmonary embolism: Secondary | ICD-10-CM

## 2020-08-28 DIAGNOSIS — F329 Major depressive disorder, single episode, unspecified: Secondary | ICD-10-CM

## 2020-08-28 DIAGNOSIS — D649 Anemia, unspecified: Secondary | ICD-10-CM

## 2020-08-28 LAB — CBC WITH DIFFERENTIAL/PLATELET
Abs Immature Granulocytes: 0.06 10*3/uL (ref 0.00–0.07)
Basophils Absolute: 0 10*3/uL (ref 0.0–0.1)
Basophils Relative: 0 %
Eosinophils Absolute: 0 10*3/uL (ref 0.0–0.5)
Eosinophils Relative: 0 %
HCT: 28.9 % — ABNORMAL LOW (ref 39.0–52.0)
Hemoglobin: 9 g/dL — ABNORMAL LOW (ref 13.0–17.0)
Immature Granulocytes: 1 %
Lymphocytes Relative: 11 %
Lymphs Abs: 1.1 10*3/uL (ref 0.7–4.0)
MCH: 26.6 pg (ref 26.0–34.0)
MCHC: 31.1 g/dL (ref 30.0–36.0)
MCV: 85.5 fL (ref 80.0–100.0)
Monocytes Absolute: 1 10*3/uL (ref 0.1–1.0)
Monocytes Relative: 10 %
Neutro Abs: 7.9 10*3/uL — ABNORMAL HIGH (ref 1.7–7.7)
Neutrophils Relative %: 78 %
Platelets: 369 10*3/uL (ref 150–400)
RBC: 3.38 MIL/uL — ABNORMAL LOW (ref 4.22–5.81)
RDW: 19.3 % — ABNORMAL HIGH (ref 11.5–15.5)
WBC: 10.1 10*3/uL (ref 4.0–10.5)
nRBC: 0 % (ref 0.0–0.2)

## 2020-08-28 LAB — COMPREHENSIVE METABOLIC PANEL
ALT: 23 U/L (ref 0–44)
AST: 53 U/L — ABNORMAL HIGH (ref 15–41)
Albumin: 3.1 g/dL — ABNORMAL LOW (ref 3.5–5.0)
Alkaline Phosphatase: 99 U/L (ref 38–126)
Anion gap: 13 (ref 5–15)
BUN: 13 mg/dL (ref 8–23)
CO2: 22 mmol/L (ref 22–32)
Calcium: 8.6 mg/dL — ABNORMAL LOW (ref 8.9–10.3)
Chloride: 103 mmol/L (ref 98–111)
Creatinine, Ser: 0.91 mg/dL (ref 0.61–1.24)
GFR, Estimated: >60 mL/min (ref >60)
Glucose, Bld: 101 mg/dL — ABNORMAL HIGH (ref 70–99)
Potassium: 3.7 mmol/L (ref 3.5–5.1)
Sodium: 138 mmol/L (ref 135–145)
Total Bilirubin: 0.5 mg/dL (ref 0.3–1.2)
Total Protein: 6.7 g/dL (ref 6.5–8.1)

## 2020-08-28 LAB — MAGNESIUM: Magnesium: 2 mg/dL (ref 1.7–2.4)

## 2020-08-28 LAB — TSH: TSH: 1.121 u[IU]/mL (ref 0.350–4.500)

## 2020-08-28 MED ORDER — EPOETIN ALFA-EPBX 10000 UNIT/ML IJ SOLN
10000.0000 [IU] | Freq: Once | INTRAMUSCULAR | Status: AC
  Administered 2020-08-28: 10000 [IU] via SUBCUTANEOUS

## 2020-08-28 MED ORDER — SODIUM CHLORIDE 0.9 % IV SOLN
700.0000 mg | Freq: Once | INTRAVENOUS | Status: AC
  Administered 2020-08-28: 700 mg via INTRAVENOUS
  Filled 2020-08-28: qty 20

## 2020-08-28 MED ORDER — HEPARIN SOD (PORK) LOCK FLUSH 100 UNIT/ML IV SOLN
INTRAVENOUS | Status: AC
  Filled 2020-08-28: qty 5

## 2020-08-28 MED ORDER — HEPARIN SOD (PORK) LOCK FLUSH 100 UNIT/ML IV SOLN
500.0000 [IU] | Freq: Once | INTRAVENOUS | Status: AC | PRN
  Administered 2020-08-28: 500 [IU]
  Filled 2020-08-28: qty 5

## 2020-08-28 MED ORDER — PEMBROLIZUMAB CHEMO INJECTION 100 MG/4ML
200.0000 mg | Freq: Once | INTRAVENOUS | Status: AC
  Administered 2020-08-28: 200 mg via INTRAVENOUS
  Filled 2020-08-28: qty 8

## 2020-08-28 MED ORDER — SODIUM CHLORIDE 0.9 % IV SOLN
Freq: Once | INTRAVENOUS | Status: AC
  Filled 2020-08-28: qty 250

## 2020-08-28 MED ORDER — SODIUM CHLORIDE 0.9 % IV SOLN
10.0000 mg | Freq: Once | INTRAVENOUS | Status: AC
  Administered 2020-08-28: 10 mg via INTRAVENOUS
  Filled 2020-08-28: qty 10

## 2020-08-28 MED ORDER — PALONOSETRON HCL INJECTION 0.25 MG/5ML
0.2500 mg | Freq: Once | INTRAVENOUS | Status: AC
  Administered 2020-08-28: 0.25 mg via INTRAVENOUS

## 2020-08-28 MED ORDER — CYANOCOBALAMIN 1000 MCG/ML IJ SOLN
1000.0000 ug | Freq: Once | INTRAMUSCULAR | Status: AC
  Administered 2020-08-28: 1000 ug via INTRAMUSCULAR

## 2020-08-28 NOTE — Telephone Encounter (Signed)
Spoke with patient to see if it was okay if the NP could do a Telehealth Consult instead of an In-home visit and he was in agreement with this.

## 2020-08-28 NOTE — Progress Notes (Signed)
Patient stable at discharge.

## 2020-08-29 ENCOUNTER — Other Ambulatory Visit: Payer: Self-pay

## 2020-08-29 ENCOUNTER — Inpatient Hospital Stay: Payer: Medicare Other

## 2020-08-29 VITALS — BP 105/69 | HR 75 | Temp 96.0°F | Resp 16

## 2020-08-29 DIAGNOSIS — C7951 Secondary malignant neoplasm of bone: Secondary | ICD-10-CM | POA: Diagnosis not present

## 2020-08-29 DIAGNOSIS — C3431 Malignant neoplasm of lower lobe, right bronchus or lung: Secondary | ICD-10-CM

## 2020-08-29 DIAGNOSIS — Z5111 Encounter for antineoplastic chemotherapy: Secondary | ICD-10-CM | POA: Diagnosis not present

## 2020-08-29 DIAGNOSIS — Z5112 Encounter for antineoplastic immunotherapy: Secondary | ICD-10-CM | POA: Diagnosis not present

## 2020-08-29 DIAGNOSIS — C3432 Malignant neoplasm of lower lobe, left bronchus or lung: Secondary | ICD-10-CM | POA: Diagnosis not present

## 2020-08-29 DIAGNOSIS — D701 Agranulocytosis secondary to cancer chemotherapy: Secondary | ICD-10-CM | POA: Diagnosis not present

## 2020-08-29 DIAGNOSIS — Z5189 Encounter for other specified aftercare: Secondary | ICD-10-CM | POA: Diagnosis not present

## 2020-08-29 MED ORDER — PEGFILGRASTIM-JMDB 6 MG/0.6ML ~~LOC~~ SOSY
6.0000 mg | PREFILLED_SYRINGE | Freq: Once | SUBCUTANEOUS | Status: AC
  Administered 2020-08-29: 6 mg via SUBCUTANEOUS

## 2020-08-30 ENCOUNTER — Other Ambulatory Visit: Payer: Medicare Other | Admitting: Primary Care

## 2020-09-04 ENCOUNTER — Other Ambulatory Visit: Payer: Self-pay | Admitting: *Deleted

## 2020-09-04 DIAGNOSIS — F419 Anxiety disorder, unspecified: Secondary | ICD-10-CM

## 2020-09-04 DIAGNOSIS — G893 Neoplasm related pain (acute) (chronic): Secondary | ICD-10-CM

## 2020-09-04 MED ORDER — OXYCODONE HCL 20 MG PO TABS: 20.0000 mg | ORAL_TABLET | ORAL | 0 refills | Status: DC | PRN

## 2020-09-04 MED ORDER — ALPRAZOLAM 0.5 MG PO TABS: 0.5000 mg | ORAL_TABLET | Freq: Four times a day (QID) | ORAL | 0 refills | Status: DC | PRN

## 2020-09-05 ENCOUNTER — Other Ambulatory Visit: Payer: Self-pay

## 2020-09-05 ENCOUNTER — Inpatient Hospital Stay: Payer: Medicare Other

## 2020-09-05 DIAGNOSIS — Z5111 Encounter for antineoplastic chemotherapy: Secondary | ICD-10-CM | POA: Diagnosis not present

## 2020-09-05 DIAGNOSIS — C7951 Secondary malignant neoplasm of bone: Secondary | ICD-10-CM

## 2020-09-05 DIAGNOSIS — D701 Agranulocytosis secondary to cancer chemotherapy: Secondary | ICD-10-CM | POA: Diagnosis not present

## 2020-09-05 DIAGNOSIS — Z5112 Encounter for antineoplastic immunotherapy: Secondary | ICD-10-CM | POA: Diagnosis not present

## 2020-09-05 DIAGNOSIS — C3431 Malignant neoplasm of lower lobe, right bronchus or lung: Secondary | ICD-10-CM

## 2020-09-05 DIAGNOSIS — Z5189 Encounter for other specified aftercare: Secondary | ICD-10-CM | POA: Diagnosis not present

## 2020-09-05 DIAGNOSIS — C3432 Malignant neoplasm of lower lobe, left bronchus or lung: Secondary | ICD-10-CM | POA: Diagnosis not present

## 2020-09-05 MED ORDER — CYANOCOBALAMIN 1000 MCG/ML IJ SOLN
1000.0000 ug | Freq: Once | INTRAMUSCULAR | Status: AC
  Administered 2020-09-05: 1000 ug via INTRAMUSCULAR
  Filled 2020-09-05: qty 1

## 2020-09-06 ENCOUNTER — Other Ambulatory Visit: Payer: Medicare Other | Admitting: Primary Care

## 2020-09-06 DIAGNOSIS — C3431 Malignant neoplasm of lower lobe, right bronchus or lung: Secondary | ICD-10-CM | POA: Diagnosis not present

## 2020-09-06 DIAGNOSIS — F32A Depression, unspecified: Secondary | ICD-10-CM

## 2020-09-06 DIAGNOSIS — C7951 Secondary malignant neoplasm of bone: Secondary | ICD-10-CM

## 2020-09-06 DIAGNOSIS — Z515 Encounter for palliative care: Secondary | ICD-10-CM

## 2020-09-06 DIAGNOSIS — R634 Abnormal weight loss: Secondary | ICD-10-CM | POA: Diagnosis not present

## 2020-09-06 NOTE — Progress Notes (Signed)
[facility] Consult Note Telephone: [phone]  Fax: [phone]    Date of encounter: 09/06/20 PATIENT NAME: Jimmy West [address] [place] [address] [phone] (home)  DOB: 1951/01/10 MRN: [id number]  PRIMARY CARE PROVIDER:    Patient, No Pcp Per,  No address on file None  REFERRING PROVIDER:   Home, NP [place],  [place] [address] [phone]  RESPONSIBLE PARTY:   Extended Emergency Contact Information Primary Emergency Contact: Leticia Penna, [place] [address] [place] Phone: [phone] Relation: Friend  I met face to face with patient in home. Palliative Care was asked to follow this patient by consultation request of Borders, Kirt Boys, NP to help address advance care planning and goals of care. This is the initial visit.   ASSESSMENT AND RECOMMENDATIONS:   1. Advance Care Planning/Goals of Care: Goals include to maximize quality of life and symptom management. Our advance care planning conversation included a discussion about:     The value and importance of advance care planning   Experiences with loved ones who have been seriously ill or have died   Exploration of personal, cultural or spiritual beliefs that might influence medical decisions   Exploration of goals of care in the event of a sudden injury or illness   Identification and preparation of a healthcare agent  TBD  Review  of an  advance directive document . I met with patient in his trailer home. He lives alone with eight cats that he and his mother took in sometime ago. His mother lived with him until her death but now he's alone. He has one brother in [place] but states that his friends are local. He has a small friend group and one of them just also passed away. We discussed power of attorney. He states he doesn't have any advance directives, living will, last will  or power of attorney. We discussed the importance of having someone that could make decisions on his behalf if he could not. I left him a copy of five wishes and a MOST  form which he wanted to review. We will discuss on our next visit.   I spent 35 minutes providing this consultation,  from 1200 to 1235. More than 50% of the time in this consultation was spent in counseling and care coordination. ___________________________________________________________________________   2. Symptom Management:   Pain management:  He's currently taking 20 to 30 mg of oxycodone qID. He states he had tried a fentanyl patch but forgot to change it every three days. Endorses pain in neck and states he was assessed for a surgery but he felt it was too much of a risk to do. He now has chronic disease related pain impacting his QOL.  He states he has taken  a long acting preparation but I would like to know if he would possibly benefit from a long acting morphine with supplemental PRN dosing.  I would recommend an oxycontin or ms contin with prn btp dosing.  Recent scans indicate active disease.  Mood: He did describe depression and anxiety and insomnia and early weakening to feed his cats but then returning to sleep until around lunchtime. He endorsed being able to control some of his anxiety with positive self talk. I recommend assessment for SNRI. He would likely benefit from an anti-depressant.  Nutrition: He endorses food insecurity and also fatigue  which would impact him being able to prepare meals.  He says he can drink the supplements but they are expensive. He has gotten some through his insurance. He may benefit from a meals on wheels later. Albumin has dropped from 3.7 o 3.1 over past month.  Self-care strain: He is alone and endorses increasing fatigue and difficulty in taking care of his household. He endorses difficulty in picking up cat litter and bags of cat food. He may benefit from a  chore type helper if  he would qualify or be able to pay out-of-pocket.   He requests a DMV form for handicap placard, which I will generate and mail to him.   Medication Review: We reviewed medications. He has a good understanding of his medications. He stated he had no albuterol inhaler and had never been on a chronic inhaler for COPD. He stated that the $25 co-pay for  an HFA under good RX would be onerous. I will ask social worker to address some resource issues.  3. Follow up Palliative Care Visit: Palliative care will continue to follow for goals of care clarification and symptom management. Return 4 weeks or prn.  4. Family /Caregiver/Community Supports: Brother in [place], friends for support, endorses small support network. Enjoyed golf with a few friends.  5. Cognitive / Functional decline:  A and O x 3, increasing fatigue and pain. Currently living alone and doing all adls but feels he is losing strength and stamina.  CODE STATUS:FULL  PPS: 60%  HOSPICE ELIGIBILITY/DIAGNOSIS: TBD  Subjective:  CHIEF COMPLAINT: pain  HISTORY OF PRESENT ILLNESS:  Jimmy West is a 70 y.o. year old male  with stage 4 lung cancer being treated with chemotherapeutics, spinal mets, h/o smoking x 40 years (20 pack years), frailty, presents today with chronic pain. He states it is mostly in his back and spine, constant, in the context of metastatic lung cancer. It has been going on for a year or more, and is somewhat relieved by oxycodone. He endorse the medicine does not relieve the pain as well as it once did.   We are asked to consult around advance care planning and complex medical decision making.    Review and summarization of old Epic records shows or history from other than patient.  Review or lab tests, radiology,  or medicine: Reviewed recent scans showing active disease, and labs- albumin is dropping.  History obtained from review of EMR, discussion with primary team, and  interview with family, caregiver   and/or Mr. Ibarra. Records reviewed and summarized above.   CURRENT PROBLEM LIST:  Patient Active Problem List   Diagnosis Date Noted  . Nonintractable headache 07/01/2020  . Chronic anticoagulation 05/02/2020  . Pleuritic pain 03/26/2020  . Chemotherapy-induced neutropenia ([facility]) 01/06/2020  . Depression 12/21/2019  . Palliative care encounter 08/26/2019  . Anemia due to antineoplastic chemotherapy 06/27/2019  . C. difficile diarrhea 06/26/2019  . Pulmonary embolus ([facility]) 06/20/2019  . Anxiety in cancer patient 03/29/2019  . Renal insufficiency 03/29/2019  . Other fatigue 03/04/2019  . Vomiting 03/04/2019  . Chemotherapy induced nausea and vomiting 02/12/2019  . Abdominal pain 02/11/2019  . Mild protein-calorie malnutrition ([facility]) 10/10/2018  . Weight loss 09/20/2018  . Elevated bilirubin 07/14/2018  . Encounter for antineoplastic chemotherapy 05/24/2018  . Encounter for antineoplastic immunotherapy 05/24/2018  . Goals of care, counseling/discussion 05/17/2018  . Cancer-related pain 05/01/2018  . Spine metastasis ([facility]) 04/30/2018  . Primary cancer of right lower lobe of lung ([facility]) 04/26/2018  .  Chronic midline low back pain without sciatica 04/26/2018   PAST MEDICAL HISTORY:  Active Ambulatory Problems    Diagnosis Date Noted  . Primary cancer of right lower lobe of lung ([facility]) 04/26/2018  . Chronic midline low back pain without sciatica 04/26/2018  . Spine metastasis ([facility]) 04/30/2018  . Cancer-related pain 05/01/2018  . Goals of care, counseling/discussion 05/17/2018  . Encounter for antineoplastic chemotherapy 05/24/2018  . Encounter for antineoplastic immunotherapy 05/24/2018  . Elevated bilirubin 07/14/2018  . Weight loss 09/20/2018  . Mild protein-calorie malnutrition ([facility]) 10/10/2018  . Abdominal pain 02/11/2019  . Chemotherapy induced nausea and vomiting 02/12/2019  . Other fatigue 03/04/2019  . Vomiting 03/04/2019  . Anxiety in cancer patient 03/29/2019  . Renal  insufficiency 03/29/2019  . Pulmonary embolus ([facility]) 06/20/2019  . C. difficile diarrhea 06/26/2019  . Anemia due to antineoplastic chemotherapy 06/27/2019  . Palliative care encounter 08/26/2019  . Depression 12/21/2019  . Chemotherapy-induced neutropenia ([facility]) 01/06/2020  . Pleuritic pain 03/26/2020  . Chronic anticoagulation 05/02/2020  . Nonintractable headache 07/01/2020   Resolved Ambulatory Problems    Diagnosis Date Noted  . No Resolved Ambulatory Problems   Past Medical History:  Diagnosis Date  . Bulging lumbar disc   . Cancer (HCC)   . Heart attack [facility])    SOCIAL HX:  Social History   Tobacco Use  . Smoking status: Former Smoker    Packs/day: 1.50    Years: 15.00    Pack years: 22.50    Types: Cigarettes    Quit date: 11/03/2003    Years since quitting: 16.8  . Smokeless tobacco: Never Used  Substance Use Topics  . Alcohol use: Not Currently   FAMILY HX:  Family History  Problem Relation Age of Onset  . Heart failure Father   . Cancer Maternal Aunt   . Heart failure Maternal Uncle   . Dementia Paternal Grandmother   . Cancer Maternal Aunt       ALLERGIES: No Known Allergies   PERTINENT MEDICATIONS:  Outpatient Encounter Medications as of 09/06/2020  Medication Sig  . acetaminophen (TYLENOL) 500 MG tablet Take 500 mg by mouth 3 (three) times daily as needed.  (Patient not taking: Reported on 08/28/2020)  . albuterol (VENTOLIN HFA) 108 (90 Base) MCG/ACT inhaler Inhale 2 puffs into the lungs every 6 (six) hours as needed for wheezing or shortness of breath.  . ALPRAZolam (XANAX) 0.5 MG tablet Take 1 tablet (0.5 mg total) by mouth 4 (four) times daily as needed for anxiety.  . Calcium 600-400 MG-UNIT CHEW Chew 2 tablets by mouth daily.  Marland Kitchen ELIQUIS 5 MG TABS tablet TAKE 1 TABLET BY MOUTH TWICE DAILY  . FLUoxetine (PROZAC) 40 MG capsule Take 1 capsule (40 mg total) by mouth daily.  . folic acid (FOLVITE) 1 MG tablet Take 1 tablet (1 mg total) by mouth daily.  Continue until 21 days after Alimta completed.  Marland Kitchen OLANZapine (ZYPREXA) 5 MG tablet Take 1 tablet (5 mg total) by mouth at bedtime.  . ondansetron (ZOFRAN-ODT) 8 MG disintegrating tablet DISSOLVE 1 TABLET ON TONGUE EVERY 8 HOURS AS NEEDED FOR NAUSEA OR VOMITING  . Oxycodone HCl 20 MG TABS Take 1-1.5 tablets (20-30 mg total) by mouth every 4 (four) hours as needed.  . polyethylene glycol (MIRALAX / GLYCOLAX) packet Take 17 g by mouth daily.  . prochlorperazine (COMPAZINE) 10 MG tablet Take 1 tablet (10 mg total) by mouth every 6 (six) hours as needed for nausea or vomiting. (Patient not  taking: Reported on 08/28/2020)  . senna (SENOKOT) 8.6 MG tablet Take 1 tablet by mouth as needed.    Facility-Administered Encounter Medications as of 09/06/2020  Medication  . 0.9 %  sodium chloride infusion  . 0.9 %  sodium chloride infusion  . albuterol (PROVENTIL) (2.5 MG/3ML) 0.083% nebulizer solution 2.5 mg  . alteplase (CATHFLO ACTIVASE) injection 2 mg  . EPINEPHrine (ADRENALIN) 1 MG/10ML injection 0.25 mg  . EPINEPHrine (ADRENALIN) 1 MG/10ML injection 0.25 mg  . heparin lock flush 100 unit/mL  . heparin lock flush 100 unit/mL  . sodium chloride flush (NS) 0.9 % injection 10 mL  . sodium chloride flush (NS) 0.9 % injection 10 mL  . sodium chloride flush (NS) 0.9 % injection 3 mL    Objective: ROS  General: NAD EYES: denies vision changes, wears glasses ENMT: denies dysphagia Cardiovascular: denies chest pain Pulmonary: denies  cough, denies increased SOB at rest, endorses increase DOE Abdomen: endorses fair appetite, denies  constipation, endorses continence of bowel GU: denies dysuria, endorses continence of urine MSK:  endorses ROM limitations, no falls reported Skin: denies rashes or wounds Neurological: endorses weakness, endorses  pain, endorses insomnia Psych: Endorses improving  mood Heme/lymph/immuno: endorses  Bruises from eliquis, abnormal bleeding neg.  Physical Exam: Current and  past weights: 132 lbs, bmi 21.3 Constitutional:  NAD General: frail appearing, thin EYES: anicteric sclera, lids intact, no discharge  ENMT: intact hearing,oral mucous membranes moist, dentition intact CV:  no LE edema Pulmonary:no increased work of breathing at rest, DOE, no cough, no audible wheezes, room air Abdomen: intake 50-75%, no ascites MSK: severe sarcopenia, decreased ROM in all extremities, no contractures of LE,  ambulatory Skin: warm and dry, no rashes or wounds on visible skin Neuro: Increased generalized weakness, no cognitive impairment Psych: calm appropriate affect, A and O x 3 Hem/lymph/immuno: bruising on arms, legs due to DOAC   Thank you for the opportunity to participate in the care of Mr. Schreur.  The palliative care team will continue to follow. Please call our office at [phone] if we can be of additional assistance.  Jason Coop, NP , DNP, MPH, AGPCNP-BC, ACHPN   COVID-19 PATIENT SCREENING TOOL  Person answering questions: _______self____________   1.  Is the patient or any family member in the home showing any signs or symptoms regarding respiratory infection?                  Person with Symptom  ______________na___________ a. Fever/chills/headache                                                        Yes___ No__X_            b. Shortness of breath                                                            Yes___ No__X_           c. Cough/congestion  Yes___  No__X_          d. Muscle/Body aches/pains                                                   Yes___ No__X_         e. Gastrointestinal symptoms (diarrhea,nausea)             Yes___ No__X_         f. Sudden loss of smell or taste      Yes___ No__X_        2. Within the past 10 days, has anyone living in the home had any contact with someone with or under investigation for COVID-19?    Yes___ No__X__   Person __________________

## 2020-09-11 ENCOUNTER — Other Ambulatory Visit: Payer: Self-pay | Admitting: Hematology and Oncology

## 2020-09-11 ENCOUNTER — Telehealth: Payer: Self-pay | Admitting: *Deleted

## 2020-09-11 MED ORDER — ALBUTEROL SULFATE HFA 108 (90 BASE) MCG/ACT IN AERS: 2.0000 | INHALATION_SPRAY | Freq: Four times a day (QID) | RESPIRATORY_TRACT | 3 refills | Status: DC | PRN

## 2020-09-11 NOTE — Telephone Encounter (Signed)
Pt stated at his last home visit with palliative care, they discussed starting albuterol inhaler for COPD. Pt states has had increased cough for the past 3 days and was asking if an albuterol inhaler could be called into his pharmacy.   Please advise.

## 2020-09-12 ENCOUNTER — Other Ambulatory Visit: Payer: Self-pay

## 2020-09-13 NOTE — Telephone Encounter (Signed)
Inhaler has been sent into pharmacy on 09/11/20. See med list.

## 2020-09-17 ENCOUNTER — Telehealth: Payer: Medicare Other | Admitting: Hospice and Palliative Medicine

## 2020-09-17 ENCOUNTER — Other Ambulatory Visit: Payer: Self-pay | Admitting: *Deleted

## 2020-09-17 DIAGNOSIS — G893 Neoplasm related pain (acute) (chronic): Secondary | ICD-10-CM

## 2020-09-17 DIAGNOSIS — F419 Anxiety disorder, unspecified: Secondary | ICD-10-CM

## 2020-09-17 MED ORDER — ALPRAZOLAM 0.5 MG PO TABS: 0.5000 mg | ORAL_TABLET | Freq: Four times a day (QID) | ORAL | 0 refills | Status: DC | PRN

## 2020-09-17 MED ORDER — OXYCODONE HCL 20 MG PO TABS: 20.0000 mg | ORAL_TABLET | ORAL | 0 refills | Status: DC | PRN

## 2020-09-17 NOTE — Progress Notes (Signed)
[facility]  [address] [place], [place] [address] Phone: [phone]  Fax: [phone]   Clinic Day: 09/18/20  Referring physician: [name], NP  Chief Complaint: Jimmy West is a 70 y.o. male with metastatic adenocarcinoma of the lung who is seen for assessment prior to cycle #22 Alimta and pembrolizumab.  HPI: The patient was last seen in the medical oncology clinic on 08/28/2020. At that time, he felt fine.  He denied shortness of breath.  Pain was minimal.  Mood was good.  He had gained 2 pounds.  Exam was stable. Hematocrit was 28.9, hemoglobin 9.0, MCV 85.5, platelets 369,000, WBC 10,100. Calcium was 8.6. Albumin was 3.1. AST was 53. TSH was 1.121. He received cycle #21 Alimta and pembrolizumab followed by Fulphila on 08/29/2020. He received a vitamin B12 injection and Retacrit 10,000 units. He continued Eliquis.  During the interim, he has been "ok." Last week, he woke up one night with chills. His temperature fluctuated between 98-101 degrees throughout the night but resolved on it's own. He also had a productive cough for 2 days. This season has been bad in terms of his allergies. He has a runny nose.  The patient has been drinking fluids and eating well. His energy has been good. His shortness of breath has improved; he got a new inhaler which has helped. He still has back pain, which is more intense but does not last as long. The patient denies dizziness, lightheadedness, diarrhea and urinary symptoms. His mood is "better."   Past Medical History:  Diagnosis Date  . Bulging lumbar disc   . Cancer ([place])    stage 4 lung cancer  . Heart attack [facility])     Past Surgical History:  Procedure Laterality Date  . CARDIAC CATHETERIZATION     two stents  . KNEE SURGERY Left   . PORTA CATH INSERTION N/A 05/21/2018   Procedure: PORTA CATH INSERTION;  Surgeon: Algernon Huxley, MD;  Location: [facility];  Service:  Cardiovascular;  Laterality: N/A;    Family History  Problem Relation Age of Onset  . Heart failure Father   . Cancer Maternal Aunt   . Heart failure Maternal Uncle   . Dementia Paternal Grandmother   . Cancer Maternal Aunt     Social History:  reports that he quit smoking about 16 years ago. His smoking use included cigarettes. He has a 22.50 pack-year smoking history. He has never used smokeless tobacco. He reports previous alcohol use. He reports that he does not use drugs. He started smoking at age 25. He is smoking 1 1/2 packs/day. Patient denies known exposures to radiation ortoxins. 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 He has not been working recently as the salon was closed. He plays golf occasionally (none recently).He has 8 cats. The patient is alone today.   Allergies: No Known Allergies  Current Medications: Current Outpatient Medications  Medication Sig Dispense Refill  . albuterol (VENTOLIN HFA) 108 (90 Base) MCG/ACT inhaler Inhale 2 puffs into the lungs every 6 (six) hours as needed for wheezing or shortness of breath. 18 g 3  . ALPRAZolam (XANAX) 0.5 MG tablet Take 1 tablet (0.5 mg total) by mouth 4 (four) times daily as needed for anxiety. 60 tablet 0  . Calcium 600-400 MG-UNIT CHEW Chew 2 tablets by mouth daily.    Marland Kitchen ELIQUIS 5 MG TABS tablet TAKE 1 TABLET BY MOUTH TWICE DAILY 60 tablet  5  . FLUoxetine (PROZAC) 40 MG capsule Take 1 capsule (40 mg total) by mouth daily. 30 capsule 3  . folic acid (FOLVITE) 1 MG tablet Take 1 tablet (1 mg total) by mouth daily. Continue until 21 days after Alimta completed. 30 tablet 3  . OLANZapine (ZYPREXA) 5 MG tablet Take 1 tablet (5 mg total) by mouth at bedtime. 30 tablet 3  . ondansetron (ZOFRAN-ODT) 8 MG disintegrating tablet DISSOLVE 1 TABLET ON TONGUE EVERY 8 HOURS AS NEEDED FOR NAUSEA OR VOMITING 20 tablet 1  . Oxycodone HCl 20 MG TABS Take 1-1.5 tablets (20-30 mg total) by mouth every 4 (four)  hours as needed. 60 tablet 0  . polyethylene glycol (MIRALAX / GLYCOLAX) packet Take 17 g by mouth daily.    . prochlorperazine (COMPAZINE) 10 MG tablet Take 1 tablet (10 mg total) by mouth every 6 (six) hours as needed for nausea or vomiting. 30 tablet 0  . senna (SENOKOT) 8.6 MG tablet Take 1 tablet by mouth as needed.     Marland Kitchen acetaminophen (TYLENOL) 500 MG tablet Take 500 mg by mouth 3 (three) times daily as needed.  (Patient not taking: Reported on 08/28/2020)     No current facility-administered medications for this visit.   Facility-Administered Medications Ordered in Other Visits  Medication Dose Route Frequency Provider Last Rate Last Admin  . 0.9 %  sodium chloride infusion   Intravenous Once Madeleyn Schwimmer C, MD      . 0.9 %  sodium chloride infusion   Intravenous Once PRN Zarya Lasseigne C, MD      . albuterol (PROVENTIL) (2.5 MG/3ML) 0.083% nebulizer solution 2.5 mg  2.5 mg Nebulization Once PRN Jennings Corado C, MD      . alteplase (CATHFLO ACTIVASE) injection 2 mg  2 mg Intracatheter Once PRN Lequita Asal, MD      . EPINEPHrine (ADRENALIN) 1 MG/10ML injection 0.25 mg  0.25 mg Intravenous Once PRN Torra Pala C, MD      . EPINEPHrine (ADRENALIN) 1 MG/10ML injection 0.25 mg  0.25 mg Intravenous Once PRN Nashali Ditmer C, MD      . heparin lock flush 100 unit/mL  500 Units Intracatheter Once PRN Mike Gip, Icey Tello C, MD      . heparin lock flush 100 unit/mL  250 Units Intracatheter Once PRN Nolon Stalls C, MD      . sodium chloride flush (NS) 0.9 % injection 10 mL  10 mL Intravenous PRN Nolon Stalls C, MD   10 mL at 03/04/19 0959  . sodium chloride flush (NS) 0.9 % injection 10 mL  10 mL Intracatheter Once PRN Haven Foss C, MD      . sodium chloride flush (NS) 0.9 % injection 3 mL  3 mL Intracatheter Once PRN Lequita Asal, MD        Review of Systems  Constitutional: Negative for chills (resolved), diaphoresis, fever (resolved),  malaise/fatigue and weight loss (stable).       Feels "okay."  HENT: Negative.  Negative for congestion, ear discharge, ear pain, hearing loss, nosebleeds, sinus pain, sore throat and tinnitus.   Eyes: Negative.  Negative for blurred vision and double vision.  Respiratory: Positive for shortness of breath (on exertion). Negative for cough (productive, resolved), hemoptysis and sputum production.   Cardiovascular: Negative.  Negative for chest pain, palpitations, orthopnea and leg swelling.  Gastrointestinal: Negative.  Negative for abdominal pain, blood in stool, constipation, diarrhea, heartburn, melena, nausea and vomiting.  Eating and drinking well.  Genitourinary: Negative.  Negative for dysuria, frequency, hematuria and urgency.  Musculoskeletal: Positive for back pain. Negative for falls, joint pain, myalgias and neck pain.  Skin: Negative.  Negative for itching and rash.  Neurological: Negative.  Negative for dizziness, tingling, sensory change, focal weakness, weakness and headaches.  Endo/Heme/Allergies: Negative.  Negative for environmental allergies and polydipsia. Does not bruise/bleed easily.  Psychiatric/Behavioral: Negative for depression (mood is "better") and memory loss. The patient is not nervous/anxious (on Xanax) and does not have insomnia.   All other systems reviewed and are negative.  Performance status (ECOG): 1  Vitals Blood pressure 96/62, pulse 87, temperature 97.7 F (36.5 C), resp. rate 18, weight 132 lb 4.4 oz (60 kg), SpO2 98 %.   Physical Exam Vitals and nursing note reviewed.  Constitutional:      General: He is not in acute distress.    Appearance: He is well-developed. He is not diaphoretic.     Interventions: Face mask in place.  HENT:     Head: Normocephalic and atraumatic.     Comments: Thin brown graying hair. Mustache.    Mouth/Throat:     Mouth: Mucous membranes are moist.     Pharynx: Oropharynx is clear.  Eyes:     General: No  scleral icterus.    Extraocular Movements: Extraocular movements intact.     Conjunctiva/sclera: Conjunctivae normal.     Pupils: Pupils are equal, round, and reactive to light.     Comments: Glasses.  Gray/blue eyes.  Cardiovascular:     Rate and Rhythm: Normal rate and regular rhythm.     Heart sounds: Normal heart sounds. No murmur heard.   Pulmonary:     Effort: Pulmonary effort is normal. No respiratory distress.     Breath sounds: No wheezing.     Comments: Fine crackles at the lung bases Chest:     Chest wall: No tenderness.  Breasts:     Right: No axillary adenopathy or supraclavicular adenopathy.     Left: No axillary adenopathy or supraclavicular adenopathy.    Abdominal:     General: Bowel sounds are normal. There is no distension.     Palpations: Abdomen is soft. There is no mass.     Tenderness: There is no abdominal tenderness. There is no guarding or rebound.  Musculoskeletal:        General: No swelling or tenderness. Normal range of motion.     Cervical back: Normal range of motion and neck supple.  Lymphadenopathy:     Head:     Right side of head: No preauricular, posterior auricular or occipital adenopathy.     Left side of head: No preauricular, posterior auricular or occipital adenopathy.     Cervical: No cervical adenopathy.     Upper Body:     Right upper body: No supraclavicular or axillary adenopathy.     Left upper body: No supraclavicular or axillary adenopathy.     Lower Body: No right inguinal adenopathy. No left inguinal adenopathy.  Skin:    General: Skin is warm and dry.  Neurological:     Mental Status: He is alert and oriented to person, place, and time. Mental status is at baseline.  Psychiatric:        Behavior: Behavior normal.        Thought Content: Thought content normal.        Judgment: Judgment normal.    Imaging studies: 04/30/2018:  PET scanrevealed a 3.4 cm  hypermetabolic RLL pulmonary mass (SUV 13.2), 11 mm pulmonary  nodule in the LEFT lung (SUV 4.5), RIGHT paratracheal lymph node (SUV 5.1), and hypermetabolic activity within the T4 vertebral body (SUV 10.2).  05/03/2018:  Thoracic spine MRIon 05/03/2018 revealed T4 metastasiswith a 40% pathologic compression deformity and 5 mm of retropulsion of the vertebral body. Retropulsion results in mild spinal canal stenosis and mild bilateral C4-5 foraminal stenosis. There was paravertebral soft tissue thickening from mid T3 to mid T5, likely representing edema related to the pathologic compression deformity vs. possible extraosseous extension of the neoplasm. There were no additional thoracic spinal metastases noted.  05/03/2018:  Head MRIrevealed no intracranial metastatic disease. 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 Incidental mention made of mild paranasal sinus disease. 01/10/2019:  Cervical and thoracic spine CT at [facility] unchanged severe compression deformity/vertebral plana of T4 with a stable degree of retropulsion and focal mild spinal canal stenosis. There was interval enlargement of a right lower lobe pulmonary mass(4.5 x 3.5 cm compared to 3.9 x 2.9 cm)with redemonstrated spiculated pulmonary nodules. 01/28/2019:  Chest, abdomen, pelvisCTrevealed slight interval enlargement of a right lower lobe mass with central necrosis measuring 4.6 x 3.4 cm, previously 4.0 x 3.0 cm. There was no change in right upper lobe nodules(1.4 and 0.8 cm).Therewas almost no residua of left lung nodules, with irregular opacities in the apical left upper lobeandsuperior segment left lower lobe. There was no change in right hilar soft tissue and lymph nodes.There wasvertebra plana deformity of T4andno evidence of new osseous metastatic disease.There was no evidence of distant metastatic  disease in the abdomen or pelvis.The1.6 x 1.1 cmleft adrenal nodule(non-metabolic on prior PET scan),was unchanged. 03/24/2019:  Renal ultrasoundrevealed no acute abnormality identified.There was no hydronephrosis or bladder distention. 04/23/2019:  Abdomen and pelvis CT revealed no acute abdominal or pelvic pathology. There was fluid in the colon which can be seen with diarrhea. There was diverticulosis without evidence of diverticulitis. There was a stable 1.5 cm left adrenal nodule. 05/06/2019:  Chest, abdomen, and pelvisCTrevealed a positive response to interval therapy for the known right lower lobe lung carcinoma(4.6 x 3.4 cm to3.1 x 2.8 cm transversely). There hadbeen a mild interval decrease contiguous soft tissue that extends along the right hilum. The two right upper lobe noduleswerestable from the most recent prior study (smaller than 04/27/2018).There are no new lung nodules.Therewasno evidence of new metastatic disease.There was stable severe compression deformity/vertebra plana of T4.Therewasno evidence of other osseous metastatic disease.There was a stable left adrenal nodule consistent with an adenoma. 05/30/2019:  ChestCT angiogramrevealed a tiny subsegmental pulmonary embolusin the right lower lobe. There areas of ground-glass at the periphery of the right chest hadbecome more conspicuous. This mayrelate to mild pneumonitis, perhaps due to radiation, attention on follow-up.The dominant nodule in the right lung basewasstable in size.  08/14/2019:  Cervical spine MRIrevealed no evidence of metastatic disease within the cervical spine. 09/16/2019:  Chest, abdomen and pelvis CTrevealed asimilar-appearing spiculated right lower lobe nodule.There was no evidence for metastatic disease in the abdomen or pelvis.There was stable left adrenal adenoma. 12/15/2019:  Chest CT revealed an enlarging right lower lobe mass c/w malignancy (3.0 x 2.4 cm to 3.3 x 2.7  cm).  There was slight enlargement in a spiculated right upper lobe subpleural nodule (0.8 x 1.5 cm to 1.2 x 0.7 cm). 01/16/2020:  Bone scan  revealed no scintigraphic evidence of osseous metastatic disease. 03/21/2020:  Chest CT angiogram revealed no evidence of pulmonary embolism.  A rounded spiculated mass in the RUL measuring 3.4 x 3 cm which was slightly enlarged with adjacent pleural thickening.  There was a stable 1.4 x 0.6 cm irregular subpleural density in the RUL.  There was stable severe compression deformity in the upper thoracic vertebral body consistent with an old fracture. 06/26/2020:  Head CT with or without contrast revealed no evidence of intracranial metastasis. 06/26/2020:  Chest, abdomen, and pelvis CT with contrast revealed a 3.1 x 2.4 cm (previously 3.4 x 3.0 cm) spiculated mass in the superior segment of the RLL.  There was a stable 1.4 x 0.7 cm spiculated subpleural nodule in the posterolateral RUL.  There was a stable high grade vertebral plana deformity of the T4 vertebral body.  There was no evidence of metastatic disease in the abdomen or pelvis.  There was mild stable splenomegaly (13.6 cm). 06/26/2020:  Bone scan revealed no evidence of metastatic disease. 07/05/2020:  PET scan revealed a 3.1 x 2.5 cm mass within superior segment of the RLL with intense FDG uptake (SUV 5.57) c/w metabolically active tumor. Additionally, there were 2 small nodules within the posterior RUL which were FDG avid (1 cm ; SUV 3.84) concerning for additional sites of disease. There was a geographic, curvilinear bandlike area of increased uptake within the posterior left lung base that corresponded to an area of airspace and ground-glass attenuation. The geographic distribution of this finding favored set inflammatory or infectious process.   Appointment on 09/18/2020  Component Date Value Ref Range Status  . Magnesium 09/18/2020 1.7  1.7 - 2.4 mg/dL Final   Performed at [facility], [address], [place], [place] [address]  . Sodium 09/18/2020 140  135 - 145 mmol/L Final  . Potassium 09/18/2020 3.7  3.5 - 5.1 mmol/L Final  . Chloride 09/18/2020 100  98 - 111 mmol/L Final  . CO2 09/18/2020 28  22 - 32 mmol/L Final  . Glucose, Bld 09/18/2020 115* 70 - 99 mg/dL Final   Glucose reference range applies only to samples taken after fasting for at least 8 hours.  . BUN 09/18/2020 15  8 - 23 mg/dL Final  . Creatinine, Ser 09/18/2020 1.31* 0.61 - 1.24 mg/dL Final  . Calcium 09/18/2020 9.7  8.9 - 10.3 mg/dL Final  . Total Protein 09/18/2020 6.7  6.5 - 8.1 g/dL Final  . Albumin 09/18/2020 3.2* 3.5 - 5.0 g/dL Final  . AST 09/18/2020 39  15 - 41 U/L Final  . ALT 09/18/2020 22  0 - 44 U/L Final  . Alkaline Phosphatase 09/18/2020 93  38 - 126 U/L Final  . Total Bilirubin 09/18/2020 0.6  0.3 - 1.2 mg/dL Final  . GFR, Estimated 09/18/2020 59* >60 mL/min Final   Comment: (NOTE) Calculated using the CKD-EPI Creatinine Equation (2021)   . Anion gap 09/18/2020 12  5 - 15 Final   Performed at [facility], [address], [place], [place] [address]  . WBC 09/18/2020 10.2  4.0 - 10.5 K/uL Final  . RBC 09/18/2020 3.57* 4.22 - 5.81 MIL/uL Final  . Hemoglobin 09/18/2020 9.2* 13.0 - 17.0 g/dL Final  . HCT 09/18/2020 31.5* 39.0 - 52.0 % Final  . MCV 09/18/2020 88.2  80.0 - 100.0 fL Final  . MCH 09/18/2020 25.8* 26.0 - 34.0 pg Final  . MCHC 09/18/2020 29.2* 30.0 - 36.0 g/dL Final  .  RDW 09/18/2020 20.3* 11.5 - 15.5 % Final  . Platelets 09/18/2020 609* 150 - 400 K/uL Final  . nRBC 09/18/2020 0.0  0.0 - 0.2 % Final  . Neutrophils Relative % 09/18/2020 68  % Final  . Neutro Abs 09/18/2020 7.0  1.7 - 7.7 K/uL Final  . Lymphocytes Relative 09/18/2020 15  % Final  . Lymphs Abs 09/18/2020 1.5  0.7 - 4.0 K/uL Final  . Monocytes Relative 09/18/2020 11  % Final  . Monocytes Absolute 09/18/2020 1.2* 0.1 - 1.0 K/uL Final  . Eosinophils Relative 09/18/2020 3  % Final  . Eosinophils  Absolute 09/18/2020 0.3  0.0 - 0.5 K/uL Final  . Basophils Relative 09/18/2020 1  % Final  . Basophils Absolute 09/18/2020 0.1  0.0 - 0.1 K/uL Final  . Immature Granulocytes 09/18/2020 2  % Final  . Abs Immature Granulocytes 09/18/2020 0.16* 0.00 - 0.07 K/uL Final   Performed at [facility], [address] [place], [place] [address]    Assessment:  Jimmy West is a 70 y.o. male with metastatichigh-grade adenocarcinomaof the right lungs/p CT-guided biopsy of a RLL lung mass on 05/11/2018. Pathologyrevealed an invasive high-grade adenocarcinoma, with predominantly solid growth pattern. The neoplastic cells were TTF-1 (+), Napsin A (+), and P40 (+). He has a T4 vertebral metastasis. Clinical stage is T4N1M1.  There was not enough material for Foundation One testing. PDL-1revealed TPS 90%.  PET scanon 04/30/2018 revealed a 3.4 cm hypermetabolic RLL pulmonary mass (SUV 13.2), 11 mm pulmonary nodule in the LEFT lung (SUV 4.5), RIGHT paratracheal lymph node (SUV 5.1), and hypermetabolic activity within the T4 vertebral body (SUV 10.2).   Hereceived 11cycles ofpembrolizumab(05/24/2018 - 02/07/2019). He toleratedtreatment well. CEAwas 1.3 on 08/30/2018.LDHwas 165 on 11/29/2018.  He completed T4 radiationon 07/07/2018. He receives Xgevamonthly (06/03/2018 -03/12/2020).  He received 1 cycle of carboplatin, Alimta, and pembrolizumabon 02/08/2019. Cycle #1 was complicated by nausea, vomiting, and diarrhea necessitating hospitalization.Decision made to hold carboplatin with his second dose.He iss/p2cycles ofAlimta and pembrolizumab (02/28/2019 - 03/29/2019).Cycle #2 was complicated by nausea, vomiting, and dehydration requring fluids in clinic and an overnight stay in the hospital. Hereceivedcarboplatin (AUC 2), Alimta, and pebrolizumab(04/18/2019).He tolerated it poorly.   Hehasreceived21 cycles ofAlimta and pembrolizumabon  (05/09/2019 -04/11/2020; 07/11/2020- 08/28/2020). He began Fulphila with cycle #14 secondary to neutropenia.  He has tolerated chemotherapy well. He receives B12every 9 weeks (last02/16/2022).  Chest CT angiogram on 03/21/2020 revealed no evidence of pulmonary embolism.  A rounded spiculated 3.4 x 3 cm mass in the RUL was slightly enlarged with adjacent pleural thickening.  There was a stable 1.4 x 0.6 cm irregular subpleural density in the RUL.  There was stable severe compression deformity in the upper thoracic vertebral body consistent with an old fracture.  CT guided right lower lobe lung biopsy on 05/09/2020 revealed non-small cell carcinoma in a background of significant necrosis. Immunohistochemical studies showed strong staining for CK7, both in viable tumor cells and necrotic debris. There was patchy marking for p40 and p63. TTF-1 and Napsin-A were negative.  This may represent potential transformation to a carcinoma with squamous differentiation or the original biopsy could have represented the glandular component of an adenosquamous carcinoma.  Foundation One on 05/09/2020 revealed no reportable alterations.  Microsatellite status was stable, tumor mutational burden 5 Muts/MB, CDKN2A loss, CDKN2B loss, KRAS G12D, MTAP loss, NF2 E442*, STK11 P203 fs*84 and TP53 Y205S.  There were no reportable alterations in ALK, BRAF, EGFR, ERBB2, MET, RET, and ROS1.  Head  CT on 06/26/2020 revealed no evidence of intracranial metastasis.  Chest, abdomen, and pelvis CT on 06/26/2020 revealed a 3.1 x 2.4 cm (previously 3.4 x 3.0 cm) spiculated mass in the superior segment of the RLL.  There was a stable 1.4 x 0.7 cm spiculated subpleural nodule in the posterolateral RUL.  There was a stable high grade vertebral plana deformity of the T4 vertebral body.  There was no evidence of metastatic disease in the abdomen or pelvis.  There was mild stable splenomegaly (13.6 cm).  Bone scan on 06/26/2020 revealed no  evidence of metastatic disease.  PET scan on 07/05/2020 revealed a 3.1 x 2.5 cm mass within superior segment of the RLL with intense FDG uptake (SUV 3.97) c/w metabolically active tumor. Additionally, there were 2 small nodules within the posterior RUL which were FDG avid (1 cm ; SUV 3.84) concerning for additional sites of disease. There was a geographic, curvilinear bandlike area of increased uptake within the posterior left lung base that corresponded to an area of airspace and ground-glass attenuation. The geographic distribution of this finding favored set inflammatory or infectious process.  He hascancer-related painin T4. He is off the [employer].  He is taking oxycodone10-20mg  every 4 hours prn.  He has cervicalgia.Cervical spine MRIon 08/14/2019 showed no evidence of metastatic disease within the cervical spine. Heis followedby Dr Izora Ribas.  He has chemotherapy induced anemia.  He received Retacrit on 11/01/2019 (last 08/28/2020).  Hehad transient renal insufficiencyon 03/23/2019. Creatinine was 1.32 (baseline 0.61-1.0). Renal ultrasoundon 03/24/2019 revealed no acute abnormality identified.There was no hydronephrosis or bladder distention.Creatinine is 0.82 today.  Stool waspositive for C difficile + diarrheaon 06/14/2019.Hewas treated with oral vancomycin.  He received the high dose influenza vaccine on 06/11/2020  Symptomatically, he feels "ok."  Energy has been good. His shortness of breath has improved. He still has back pain, which is more intense but does not last as long. The patient denies dizziness, lightheadedness, diarrhea and urinary symptoms. Mood is "better."   Creatinine clearance is 44 ml/min.  Plan: 1.   Labs: CBC with diff, CMP, Mg. 2. Metastatic high-grade adenocarcinoma the RIGHT lung He is s/p 11 cycles of pembrolizumab. He is s/p 1 cycle of carboplatin, Almita, and pembrolizumab (02/08/2019). He is s/p 1 cycle of carboplatin (AUC  2), Alimta, and pembrolizumab (04/18/2019).  Heis s/p21 cycles ofAlimta and pembrolizumab(last 08/28/2020).    He receives B12every 9 weeks (last02/16/2022).   He is on folic acid. Chest CT angiogram on 09/01/2021revealed slight growth in the RUL mass.  CT guided RLL lung biopsy on 05/09/2020 revealed potential transformation to squamous differentiation.   Foundation One revealed no driver mutations.     He is intolerant to carboplatin at an AUC of 5 as well as 2.  PET scan on 07/05/2020 revealed no evidence of disease progression.   PET scan documented stable disease as compared to images from 08/2019   Decision made to continue Alimta and pembrolizumab secondary to stability of disease and tolerance.   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   At some point, his disease will progress and require Taxotere.  Patient would like to postpone until necessary.   He requires Fulphila support secondary to neutropenia at nadir.  Labs reviewed.  Postpone treatment today secondary to CrCl < 45 ml/min (Alimta).  Discuss symptom management.  He has antiemetics and pain medications at home to use on a prn bases.  Interventions are  adequate.    3. Bone metastasis Symptomatically, pain remains well controlled.  Bone scan on 06/26/2020 revealed no evidence of metastatic disease.  PET scan on 07/05/2020 revealed no evidence of metastatic disease.  He last received Xgeva on 03/12/2020.  Delton See be reinstituted if progressive bone disease is documented. 4.Anxiety and depression Clinically, he describes his mood is good.  He remains on low-dose Xanax as well as Prozac.  Continue to monitor. 5.Pulmonary embolism Continue Eliquis. 6.   Normocytic anemia  Hematocrit 31.5.  Hemoglobin 9.2.  MCV 88.2 today.  Diet is good.  He denies any bleeding.  He has  chemotherapy-induced anemia.  Ferritin was 1309 (AFR) with an iron saturation of 29% and a TIBC of 286 on 07/31/2020.  Continue Retacrit per protocol. 7.   Cough and interval fever  CXR (PA and lateral). 8.   No treatment today secondary to CrCl < 45 ml/min. 9.   RTC in 1 week for MD assessment, labs (CBC with diff, CMP), and cycle #22 Alimta and pembrolizumab.   Addendum:  CXR revealed a stable right lower lobe mass and stable Port-A-Cath position.  There were no acute findings.  I discussed the assessment and treatment plan with the patient.  The patient was provided an opportunity to ask questions and all were answered.  The patient agreed with the plan and demonstrated an understanding of the instructions.  The patient was advised to call back if the symptoms worsen or if the condition fails to improve as anticipated.   I provided 12 minutes of face-to-face time during this this encounter and > 50% was spent counseling as documented under my assessment and plan.  An additional 10 minutes were spent reviewing his chart (Epic and Care Everywhere) including notes, labs, and imaging studies.  I spoke with pharmacy and reviewed his CXR.   Lequita Asal, MD, PhD 09/18/2020; 10:31 AM   I, De Burrs, am acting as Education administrator for [employer]. Mike Gip, MD, PhD.  I, Curstin Schmale C. Mike Gip, MD, have reviewed the above documentation for accuracy and completeness, and I agree with the above.

## 2020-09-17 NOTE — Telephone Encounter (Signed)
Pt called in to request refill of xanax and oxycodone. States that he does not feel like they are helping him with his anxiety and pain as well as they have in the past. States that he has had to take more than prescribed at times. Requests to discuss prescriptions with Merrily Pew if possible but does still need a refill of those prescriptions today since he is about to run out.

## 2020-09-18 ENCOUNTER — Ambulatory Visit
Admission: RE | Admit: 2020-09-18 | Discharge: 2020-09-18 | Disposition: A | Discharge status: Home / Self Care | Payer: Medicare HMO | Source: Ambulatory Visit | Attending: Hematology and Oncology | Admitting: Hematology and Oncology

## 2020-09-18 ENCOUNTER — Other Ambulatory Visit: Payer: Self-pay

## 2020-09-18 ENCOUNTER — Inpatient Hospital Stay: Payer: Medicare HMO

## 2020-09-18 ENCOUNTER — Ambulatory Visit
Admission: RE | Admit: 2020-09-18 | Discharge: 2020-09-18 | Disposition: A | Discharge status: Home / Self Care | Payer: Medicare HMO | Attending: Hematology and Oncology | Admitting: Hematology and Oncology

## 2020-09-18 ENCOUNTER — Inpatient Hospital Stay: Payer: Medicare HMO | Attending: Hematology and Oncology

## 2020-09-18 ENCOUNTER — Inpatient Hospital Stay (HOSPITAL_BASED_OUTPATIENT_CLINIC_OR_DEPARTMENT_OTHER): Payer: Medicare HMO | Admitting: Hematology and Oncology

## 2020-09-18 ENCOUNTER — Encounter: Payer: Self-pay | Admitting: Hematology and Oncology

## 2020-09-18 VITALS — BP 96/62 | HR 87 | Temp 97.7°F | Resp 18 | Wt 132.3 lb

## 2020-09-18 DIAGNOSIS — C7951 Secondary malignant neoplasm of bone: Secondary | ICD-10-CM | POA: Insufficient documentation

## 2020-09-18 DIAGNOSIS — D6481 Anemia due to antineoplastic chemotherapy: Secondary | ICD-10-CM

## 2020-09-18 DIAGNOSIS — R059 Cough, unspecified: Secondary | ICD-10-CM | POA: Diagnosis not present

## 2020-09-18 DIAGNOSIS — R918 Other nonspecific abnormal finding of lung field: Secondary | ICD-10-CM | POA: Diagnosis not present

## 2020-09-18 DIAGNOSIS — C3431 Malignant neoplasm of lower lobe, right bronchus or lung: Secondary | ICD-10-CM | POA: Diagnosis not present

## 2020-09-18 DIAGNOSIS — C3432 Malignant neoplasm of lower lobe, left bronchus or lung: Secondary | ICD-10-CM | POA: Insufficient documentation

## 2020-09-18 DIAGNOSIS — G893 Neoplasm related pain (acute) (chronic): Secondary | ICD-10-CM | POA: Insufficient documentation

## 2020-09-18 DIAGNOSIS — Z86711 Personal history of pulmonary embolism: Secondary | ICD-10-CM | POA: Diagnosis not present

## 2020-09-18 DIAGNOSIS — D701 Agranulocytosis secondary to cancer chemotherapy: Secondary | ICD-10-CM | POA: Insufficient documentation

## 2020-09-18 DIAGNOSIS — Z87891 Personal history of nicotine dependence: Secondary | ICD-10-CM | POA: Diagnosis not present

## 2020-09-18 DIAGNOSIS — R509 Fever, unspecified: Secondary | ICD-10-CM | POA: Insufficient documentation

## 2020-09-18 DIAGNOSIS — D649 Anemia, unspecified: Secondary | ICD-10-CM | POA: Insufficient documentation

## 2020-09-18 DIAGNOSIS — T451X5A Adverse effect of antineoplastic and immunosuppressive drugs, initial encounter: Secondary | ICD-10-CM | POA: Insufficient documentation

## 2020-09-18 DIAGNOSIS — F418 Other specified anxiety disorders: Secondary | ICD-10-CM | POA: Diagnosis not present

## 2020-09-18 DIAGNOSIS — Z7901 Long term (current) use of anticoagulants: Secondary | ICD-10-CM | POA: Diagnosis not present

## 2020-09-18 DIAGNOSIS — Z5189 Encounter for other specified aftercare: Secondary | ICD-10-CM | POA: Insufficient documentation

## 2020-09-18 DIAGNOSIS — I2782 Chronic pulmonary embolism: Secondary | ICD-10-CM

## 2020-09-18 DIAGNOSIS — Z5111 Encounter for antineoplastic chemotherapy: Secondary | ICD-10-CM

## 2020-09-18 DIAGNOSIS — Z5112 Encounter for antineoplastic immunotherapy: Secondary | ICD-10-CM | POA: Diagnosis present

## 2020-09-18 DIAGNOSIS — Z79899 Other long term (current) drug therapy: Secondary | ICD-10-CM | POA: Insufficient documentation

## 2020-09-18 DIAGNOSIS — Z85118 Personal history of other malignant neoplasm of bronchus and lung: Secondary | ICD-10-CM | POA: Diagnosis not present

## 2020-09-18 DIAGNOSIS — R69 Illness, unspecified: Secondary | ICD-10-CM | POA: Diagnosis not present

## 2020-09-18 LAB — COMPREHENSIVE METABOLIC PANEL
ALT: 22 U/L (ref 0–44)
AST: 39 U/L (ref 15–41)
Albumin: 3.2 g/dL — ABNORMAL LOW (ref 3.5–5.0)
Alkaline Phosphatase: 93 U/L (ref 38–126)
Anion gap: 12 (ref 5–15)
BUN: 15 mg/dL (ref 8–23)
CO2: 28 mmol/L (ref 22–32)
Calcium: 9.7 mg/dL (ref 8.9–10.3)
Chloride: 100 mmol/L (ref 98–111)
Creatinine, Ser: 1.31 mg/dL — ABNORMAL HIGH (ref 0.61–1.24)
GFR, Estimated: 59 mL/min — ABNORMAL LOW (ref >60)
Glucose, Bld: 115 mg/dL — ABNORMAL HIGH (ref 70–99)
Potassium: 3.7 mmol/L (ref 3.5–5.1)
Sodium: 140 mmol/L (ref 135–145)
Total Bilirubin: 0.6 mg/dL (ref 0.3–1.2)
Total Protein: 6.7 g/dL (ref 6.5–8.1)

## 2020-09-18 LAB — CBC WITH DIFFERENTIAL/PLATELET
Abs Immature Granulocytes: 0.16 10*3/uL — ABNORMAL HIGH (ref 0.00–0.07)
Basophils Absolute: 0.1 10*3/uL (ref 0.0–0.1)
Basophils Relative: 1 %
Eosinophils Absolute: 0.3 10*3/uL (ref 0.0–0.5)
Eosinophils Relative: 3 %
HCT: 31.5 % — ABNORMAL LOW (ref 39.0–52.0)
Hemoglobin: 9.2 g/dL — ABNORMAL LOW (ref 13.0–17.0)
Immature Granulocytes: 2 %
Lymphocytes Relative: 15 %
Lymphs Abs: 1.5 10*3/uL (ref 0.7–4.0)
MCH: 25.8 pg — ABNORMAL LOW (ref 26.0–34.0)
MCHC: 29.2 g/dL — ABNORMAL LOW (ref 30.0–36.0)
MCV: 88.2 fL (ref 80.0–100.0)
Monocytes Absolute: 1.2 10*3/uL — ABNORMAL HIGH (ref 0.1–1.0)
Monocytes Relative: 11 %
Neutro Abs: 7 10*3/uL (ref 1.7–7.7)
Neutrophils Relative %: 68 %
Platelets: 609 10*3/uL — ABNORMAL HIGH (ref 150–400)
RBC: 3.57 MIL/uL — ABNORMAL LOW (ref 4.22–5.81)
RDW: 20.3 % — ABNORMAL HIGH (ref 11.5–15.5)
WBC: 10.2 10*3/uL (ref 4.0–10.5)
nRBC: 0 % (ref 0.0–0.2)

## 2020-09-18 LAB — MAGNESIUM: Magnesium: 1.7 mg/dL (ref 1.7–2.4)

## 2020-09-18 MED ORDER — HEPARIN SOD (PORK) LOCK FLUSH 100 UNIT/ML IV SOLN
500.0000 [IU] | Freq: Once | INTRAVENOUS | Status: AC
  Administered 2020-09-18: 500 [IU] via INTRAVENOUS
  Filled 2020-09-18: qty 5

## 2020-09-20 ENCOUNTER — Inpatient Hospital Stay: Payer: Medicare HMO | Attending: Hospice and Palliative Medicine | Admitting: Hospice and Palliative Medicine

## 2020-09-20 DIAGNOSIS — Z515 Encounter for palliative care: Secondary | ICD-10-CM

## 2020-09-20 NOTE — Progress Notes (Signed)
I was unable to reach patient for scheduled MyChart visit.  Voicemail left.  Will reschedule.

## 2020-09-24 ENCOUNTER — Encounter: Payer: Self-pay | Admitting: Hematology and Oncology

## 2020-09-24 NOTE — Progress Notes (Signed)
[facility]  [address] [place], [place] [address] Phone: [phone]  Fax: [phone]   Clinic Day: 09/25/20  Referring physician: [name], NP  Chief Complaint: Jimmy West is a 70 y.o. male with metastatic adenocarcinoma of the lung who is seen for assessment prior to cycle #22 Alimta and pembrolizumab.  HPI: The patient was last seen in the medical oncology clinic on 09/18/2020. At that time, he felt "ok".  Shortness of breath had improved.  He described a productive cough and an interval fever. He was drinking fluids and eating well.  Hematocrit was 31.5, hemoglobin 9.2, platelets 609,000, WBC 10,200. Creatinine was 1.31.  Prior creatinine was 0.91.  Chemotherapy was held secondary to decreased kidney function (CrCl < 45 ml/min).  CXR showed a stable right lower lobe mass. There were no acute findings.  Billey Chang, NP could not reach the patient for their MyChart visit on 09/20/2020.  During the interim, he has been "good." He went for a walk the other day with his friend and is going to try to do that more often. He got a little bit short of breath during the walk. He denies fevers. His mood is "good."  Over the past few weeks, he has had several episodes of a "pin prick" sensation on various parts of his body.  He took his steroids. He takes folic acid.   Past Medical History:  Diagnosis Date  . Bulging lumbar disc   . Cancer ([place])    stage 4 lung cancer  . Heart attack [facility])     Past Surgical History:  Procedure Laterality Date  . CARDIAC CATHETERIZATION     two stents  . KNEE SURGERY Left   . PORTA CATH INSERTION N/A 05/21/2018   Procedure: PORTA CATH INSERTION;  Surgeon: Algernon Huxley, MD;  Location: [facility];  Service: Cardiovascular;  Laterality: N/A;    Family History  Problem Relation Age of Onset  . Heart failure Father   . Cancer Maternal Aunt   . Heart failure Maternal Uncle   .  Dementia Paternal Grandmother   . Cancer Maternal Aunt     Social History:  reports that he quit smoking about 16 years ago. His smoking use included cigarettes. He has a 22.50 pack-year smoking history. He has never used smokeless tobacco. He reports previous alcohol use. He reports that he does not use drugs. He started smoking at age 77. He is smoking 1 1/2 packs/day. Patient denies known exposures to radiation ortoxins. 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 He has not been working recently as the salon was closed. He plays golf occasionally (none recently).He has 8 cats. The patient is alone today.   Allergies: No Known Allergies  Current Medications: Current Outpatient Medications  Medication Sig Dispense Refill  . albuterol (VENTOLIN HFA) 108 (90 Base) MCG/ACT inhaler Inhale 2 puffs into the lungs every 6 (six) hours as needed for wheezing or shortness of breath. 18 g 3  . ALPRAZolam (XANAX) 0.5 MG tablet Take 1 tablet (0.5 mg total) by mouth 4 (four) times daily as needed for anxiety. 60 tablet 0  . Calcium 600-400 MG-UNIT CHEW Chew 2 tablets by mouth daily.    Marland Kitchen ELIQUIS 5 MG TABS tablet TAKE 1 TABLET BY MOUTH TWICE DAILY 60 tablet 5  . FLUoxetine (PROZAC) 40 MG capsule Take 1 capsule (40 mg total) by mouth daily. 30 capsule 3  . folic  acid (FOLVITE) 1 MG tablet Take 1 tablet (1 mg total) by mouth daily. Continue until 21 days after Alimta completed. 30 tablet 3  . OLANZapine (ZYPREXA) 5 MG tablet Take 1 tablet (5 mg total) by mouth at bedtime. 30 tablet 3  . ondansetron (ZOFRAN-ODT) 8 MG disintegrating tablet DISSOLVE 1 TABLET ON TONGUE EVERY 8 HOURS AS NEEDED FOR NAUSEA OR VOMITING 20 tablet 1  . Oxycodone HCl 20 MG TABS Take 1-1.5 tablets (20-30 mg total) by mouth every 4 (four) hours as needed. 60 tablet 0  . polyethylene glycol (MIRALAX / GLYCOLAX) packet Take 17 g by mouth daily.    . prochlorperazine (COMPAZINE) 10 MG tablet Take 1 tablet (10 mg  total) by mouth every 6 (six) hours as needed for nausea or vomiting. 30 tablet 0  . senna (SENOKOT) 8.6 MG tablet Take 1 tablet by mouth as needed.      No current facility-administered medications for this visit.   Facility-Administered Medications Ordered in Other Visits  Medication Dose Route Frequency Provider Last Rate Last Admin  . 0.9 %  sodium chloride infusion   Intravenous Once Jameshia Hayashida C, MD      . 0.9 %  sodium chloride infusion   Intravenous Once PRN Beau Vanduzer C, MD      . albuterol (PROVENTIL) (2.5 MG/3ML) 0.083% nebulizer solution 2.5 mg  2.5 mg Nebulization Once PRN Gerilynn Mccullars C, MD      . alteplase (CATHFLO ACTIVASE) injection 2 mg  2 mg Intracatheter Once PRN Lequita Asal, MD      . EPINEPHrine (ADRENALIN) 1 MG/10ML injection 0.25 mg  0.25 mg Intravenous Once PRN Hilaria Titsworth C, MD      . EPINEPHrine (ADRENALIN) 1 MG/10ML injection 0.25 mg  0.25 mg Intravenous Once PRN Billiejean Schimek C, MD      . heparin lock flush 100 unit/mL  500 Units Intracatheter Once PRN Mike Gip, Judie Hollick C, MD      . heparin lock flush 100 unit/mL  250 Units Intracatheter Once PRN Nolon Stalls C, MD      . sodium chloride flush (NS) 0.9 % injection 10 mL  10 mL Intravenous PRN Nolon Stalls C, MD   10 mL at 03/04/19 0959  . sodium chloride flush (NS) 0.9 % injection 10 mL  10 mL Intracatheter Once PRN Jacklin Zwick C, MD      . sodium chloride flush (NS) 0.9 % injection 3 mL  3 mL Intracatheter Once PRN Lequita Asal, MD        Review of Systems  Constitutional: Negative for chills, diaphoresis, fever, malaise/fatigue and weight loss (up 2 lbs).       Feels "good."  HENT: Negative.  Negative for congestion, ear discharge, ear pain, hearing loss, nosebleeds, sinus pain, sore throat and tinnitus.   Eyes: Negative.  Negative for blurred vision and double vision.  Respiratory: Positive for shortness of breath (on exertion). Negative for cough,  hemoptysis and sputum production.   Cardiovascular: Negative.  Negative for chest pain, palpitations, orthopnea and leg swelling.  Gastrointestinal: Negative.  Negative for abdominal pain, blood in stool, constipation, diarrhea, heartburn, melena, nausea and vomiting.  Genitourinary: Negative.  Negative for dysuria, frequency, hematuria and urgency.  Musculoskeletal: Negative for back pain, joint pain, myalgias and neck pain.  Skin: Negative.  Negative for itching and rash.  Neurological: Negative for dizziness, tingling, sensory change, focal weakness, weakness and headaches.       "Pin prick" sensation in various  places on his body  Endo/Heme/Allergies: Negative.  Negative for environmental allergies and polydipsia. Does not bruise/bleed easily.  Psychiatric/Behavioral: Negative for depression (mood is "good.") and memory loss. The patient is not nervous/anxious (on Xanax) and does not have insomnia.   All other systems reviewed and are negative.  Performance status (ECOG): 1  Vitals Blood pressure 116/64, pulse (!) 57, temperature (!) 96.3 F (35.7 C), temperature source Tympanic, resp. rate 18, weight 134 lb 7.7 oz (61 kg), SpO2 99 %.   Physical Exam Vitals and nursing note reviewed.  Constitutional:      General: He is not in acute distress.    Appearance: He is well-developed. He is not diaphoretic.     Interventions: Face mask in place.  HENT:     Head: Normocephalic and atraumatic.     Comments: Thin brown graying hair. Mustache.    Mouth/Throat:     Mouth: Mucous membranes are moist.     Pharynx: Oropharynx is clear.  Eyes:     General: No scleral icterus.    Extraocular Movements: Extraocular movements intact.     Conjunctiva/sclera: Conjunctivae normal.     Pupils: Pupils are equal, round, and reactive to light.     Comments: Glasses.  Gray/blue eyes.  Cardiovascular:     Rate and Rhythm: Normal rate and regular rhythm.     Heart sounds: Normal heart sounds. No murmur  heard.   Pulmonary:     Effort: Pulmonary effort is normal. No respiratory distress.     Breath sounds: No wheezing.  Chest:     Chest wall: No tenderness.  Breasts:     Right: No axillary adenopathy or supraclavicular adenopathy.     Left: No axillary adenopathy or supraclavicular adenopathy.    Abdominal:     General: Bowel sounds are normal. There is no distension.     Palpations: Abdomen is soft. There is no mass.     Tenderness: There is no abdominal tenderness. There is no guarding or rebound.  Musculoskeletal:        General: No swelling or tenderness. Normal range of motion.     Cervical back: Normal range of motion and neck supple.  Lymphadenopathy:     Head:     Right side of head: No preauricular, posterior auricular or occipital adenopathy.     Left side of head: No preauricular, posterior auricular or occipital adenopathy.     Cervical: No cervical adenopathy.     Upper Body:     Right upper body: No supraclavicular or axillary adenopathy.     Left upper body: No supraclavicular or axillary adenopathy.     Lower Body: No right inguinal adenopathy. No left inguinal adenopathy.  Skin:    General: Skin is warm and dry.  Neurological:     Mental Status: He is alert and oriented to person, place, and time. Mental status is at baseline.  Psychiatric:        Behavior: Behavior normal.        Thought Content: Thought content normal.        Judgment: Judgment normal.    Imaging studies: 04/30/2018:  PET scanrevealed a 3.4 cm hypermetabolic RLL pulmonary mass (SUV 13.2), 11 mm pulmonary nodule in the LEFT lung (SUV 4.5), RIGHT paratracheal lymph node (SUV 5.1), and hypermetabolic activity within the T4 vertebral body (SUV 10.2).  05/03/2018:  Thoracic spine MRIon 05/03/2018 revealed T4 metastasiswith a 40% pathologic compression deformity and 5 mm of retropulsion of the vertebral  body. Retropulsion results in mild spinal canal stenosis and mild bilateral C4-5 foraminal  stenosis. There was paravertebral soft tissue thickening from mid T3 to mid T5, likely representing edema related to the pathologic compression deformity vs. possible extraosseous extension of the neoplasm. There were no additional thoracic spinal metastases noted.  05/03/2018:  Head MRIrevealed no intracranial metastatic disease. 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 Incidental mention made of mild paranasal sinus disease. 01/10/2019:  Cervical and thoracic spine CT at [facility] unchanged severe compression deformity/vertebral plana of T4 with a stable degree of retropulsion and focal mild spinal canal stenosis. There was interval enlargement of a right lower lobe pulmonary mass(4.5 x 3.5 cm compared to 3.9 x 2.9 cm)with redemonstrated spiculated pulmonary nodules. 01/28/2019:  Chest, abdomen, pelvisCTrevealed slight interval enlargement of a right lower lobe mass with central necrosis measuring 4.6 x 3.4 cm, previously 4.0 x 3.0 cm. There was no change in right upper lobe nodules(1.4 and 0.8 cm).Therewas almost no residua of left lung nodules, with irregular opacities in the apical left upper lobeandsuperior segment left lower lobe. There was no change in right hilar soft tissue and lymph nodes.There wasvertebra plana deformity of T4andno evidence of new osseous metastatic disease.There was no evidence of distant metastatic disease in the abdomen or pelvis.The1.6 x 1.1 cmleft adrenal nodule(non-metabolic on prior PET scan),was unchanged. 03/24/2019:  Renal ultrasoundrevealed no acute abnormality identified.There was no hydronephrosis or bladder distention. 04/23/2019:  Abdomen and pelvis CT revealed no acute abdominal or pelvic pathology. There was fluid in the colon which can be seen with diarrhea.  There was diverticulosis without evidence of diverticulitis. There was a stable 1.5 cm left adrenal nodule. 05/06/2019:  Chest, abdomen, and pelvisCTrevealed a positive response to interval therapy for the known right lower lobe lung carcinoma(4.6 x 3.4 cm to3.1 x 2.8 cm transversely). There hadbeen a mild interval decrease contiguous soft tissue that extends along the right hilum. The two right upper lobe noduleswerestable from the most recent prior study (smaller than 04/27/2018).There are no new lung nodules.Therewasno evidence of new metastatic disease.There was stable severe compression deformity/vertebra plana of T4.Therewasno evidence of other osseous metastatic disease.There was a stable left adrenal nodule consistent with an adenoma. 05/30/2019:  ChestCT angiogramrevealed a tiny subsegmental pulmonary embolusin the right lower lobe. There areas of ground-glass at the periphery of the right chest hadbecome more conspicuous. This mayrelate to mild pneumonitis, perhaps due to radiation, attention on follow-up.The dominant nodule in the right lung basewasstable in size.  08/14/2019:  Cervical spine MRIrevealed no evidence of metastatic disease within the cervical spine. 09/16/2019:  Chest, abdomen and pelvis CTrevealed asimilar-appearing spiculated right lower lobe nodule.There was no evidence for metastatic disease in the abdomen or pelvis.There was stable left adrenal adenoma. 12/15/2019:  Chest CT revealed an enlarging right lower lobe mass c/w malignancy (3.0 x 2.4 cm to 3.3 x 2.7 cm).  There was slight enlargement in a spiculated right upper lobe subpleural nodule (0.8 x 1.5 cm to 1.2 x 0.7 cm). 01/16/2020:  Bone scan revealed no scintigraphic evidence of osseous metastatic disease. 03/21/2020:  Chest CT angiogram revealed no evidence of pulmonary embolism.  A rounded spiculated mass in the RUL measuring 3.4 x 3 cm which was slightly enlarged with adjacent pleural  thickening.  There was a stable 1.4 x 0.6 cm irregular subpleural density in the RUL.  There was stable severe compression deformity in the upper thoracic vertebral body consistent with an old fracture. 06/26/2020:  Head CT with or without contrast revealed no evidence of intracranial metastasis. 06/26/2020:  Chest, abdomen, and pelvis CT with contrast revealed a 3.1 x 2.4 cm (previously 3.4 x 3.0 cm) spiculated mass in the superior segment of the RLL.  There was a stable 1.4 x 0.7 cm spiculated subpleural nodule in the posterolateral RUL.  There was a stable high grade vertebral plana deformity of the T4 vertebral body.  There was no evidence of metastatic disease in the abdomen or pelvis.  There was mild stable splenomegaly (13.6 cm). 06/26/2020:  Bone scan revealed no evidence of metastatic disease. 07/05/2020:  PET scan revealed a 3.1 x 2.5 cm mass within superior segment of the RLL with intense FDG uptake (SUV 2.13) c/w metabolically active tumor. Additionally, there were 2 small nodules within the posterior RUL which were FDG avid (1 cm ; SUV 3.84) concerning for additional sites of disease. There was a geographic, curvilinear bandlike area of increased uptake within the posterior left lung base that corresponded to an area of airspace and ground-glass attenuation. The geographic distribution of this finding favored set inflammatory or infectious process.   Infusion on 09/25/2020  Component Date Value Ref Range Status  . Sodium 09/25/2020 140  135 - 145 mmol/L Final  . Potassium 09/25/2020 3.4* 3.5 - 5.1 mmol/L Final  . Chloride 09/25/2020 105  98 - 111 mmol/L Final  . CO2 09/25/2020 26  22 - 32 mmol/L Final  . Glucose, Bld 09/25/2020 131* 70 - 99 mg/dL Final   Glucose reference range applies only to samples taken after fasting for at least 8 hours.  . BUN 09/25/2020 21  8 - 23 mg/dL Final  . Creatinine, Ser 09/25/2020 1.21  0.61 - 1.24 mg/dL Final  . Calcium 09/25/2020 9.5  8.9 - 10.3 mg/dL  Final  . Total Protein 09/25/2020 6.8  6.5 - 8.1 g/dL Final  . Albumin 09/25/2020 3.2* 3.5 - 5.0 g/dL Final  . AST 09/25/2020 27  15 - 41 U/L Final  . ALT 09/25/2020 16  0 - 44 U/L Final  . Alkaline Phosphatase 09/25/2020 94  38 - 126 U/L Final  . Total Bilirubin 09/25/2020 0.6  0.3 - 1.2 mg/dL Final  . GFR, Estimated 09/25/2020 >60  >60 mL/min Final   Comment: (NOTE) Calculated using the CKD-EPI Creatinine Equation (2021)   . Anion gap 09/25/2020 9  5 - 15 Final   Performed at [facility], [address], [place], [place] [address]  . WBC 09/25/2020 10.3  4.0 - 10.5 K/uL Final  . RBC 09/25/2020 3.32* 4.22 - 5.81 MIL/uL Final  . Hemoglobin 09/25/2020 9.1* 13.0 - 17.0 g/dL Final  . HCT 09/25/2020 29.8* 39.0 - 52.0 % Final  . MCV 09/25/2020 89.8  80.0 - 100.0 fL Final  . MCH 09/25/2020 27.4  26.0 - 34.0 pg Final  . MCHC 09/25/2020 30.5  30.0 - 36.0 g/dL Final  . RDW 09/25/2020 19.9* 11.5 - 15.5 % Final  . Platelets 09/25/2020 426* 150 - 400 K/uL Final  . nRBC 09/25/2020 0.0  0.0 - 0.2 % Final  . Neutrophils Relative % 09/25/2020 70  % Final  . Neutro Abs 09/25/2020 7.2  1.7 - 7.7 K/uL Final  . Lymphocytes Relative 09/25/2020 17  % Final  . Lymphs Abs 09/25/2020 1.7  0.7 - 4.0 K/uL Final  . Monocytes Relative 09/25/2020 10  %  Final  . Monocytes Absolute 09/25/2020 1.0  0.1 - 1.0 K/uL Final  . Eosinophils Relative 09/25/2020 1  % Final  . Eosinophils Absolute 09/25/2020 0.1  0.0 - 0.5 K/uL Final  . Basophils Relative 09/25/2020 1  % Final  . Basophils Absolute 09/25/2020 0.1  0.0 - 0.1 K/uL Final  . Immature Granulocytes 09/25/2020 1  % Final  . Abs Immature Granulocytes 09/25/2020 0.14* 0.00 - 0.07 K/uL Final   Performed at [facility], [address] [place], [place] [address]    Assessment:  Axyl Sitzman is a 70 y.o. male with metastatichigh-grade adenocarcinomaof the right lungs/p CT-guided biopsy of a RLL lung mass on 05/11/2018.  Pathologyrevealed an invasive high-grade adenocarcinoma, with predominantly solid growth pattern. The neoplastic cells were TTF-1 (+), Napsin A (+), and P40 (+). He has a T4 vertebral metastasis. Clinical stage is T4N1M1.  There was not enough material for Foundation One testing. PDL-1revealed TPS 90%.  PET scanon 04/30/2018 revealed a 3.4 cm hypermetabolic RLL pulmonary mass (SUV 13.2), 11 mm pulmonary nodule in the LEFT lung (SUV 4.5), RIGHT paratracheal lymph node (SUV 5.1), and hypermetabolic activity within the T4 vertebral body (SUV 10.2).   Hereceived 11cycles ofpembrolizumab(05/24/2018 - 02/07/2019). He toleratedtreatment well. CEAwas 1.3 on 08/30/2018.LDHwas 165 on 11/29/2018.  He completed T4 radiationon 07/07/2018. He receives Xgevamonthly (06/03/2018 -03/12/2020).  He received 1 cycle of carboplatin, Alimta, and pembrolizumabon 02/08/2019. Cycle #1 was complicated by nausea, vomiting, and diarrhea necessitating hospitalization.Decision made to hold carboplatin with his second dose.He iss/p2cycles ofAlimta and pembrolizumab (02/28/2019 - 03/29/2019).Cycle #2 was complicated by nausea, vomiting, and dehydration requring fluids in clinic and an overnight stay in the hospital. Hereceivedcarboplatin (AUC 2), Alimta, and pebrolizumab(04/18/2019).He tolerated it poorly.   Hehasreceived21 cycles ofAlimta and pembrolizumabon (05/09/2019 -04/11/2020; 07/11/2020- 08/28/2020). He began Fulphila with cycle #14 secondary to neutropenia.  He has tolerated chemotherapy well. He receives B12every 9 weeks (last02/02/2021).  Chest CT angiogram on 03/21/2020 revealed no evidence of pulmonary embolism.  A rounded spiculated 3.4 x 3 cm mass in the RUL was slightly enlarged with adjacent pleural thickening.  There was a stable 1.4 x 0.6 cm irregular subpleural density in the RUL.  There was stable severe compression deformity in the upper thoracic  vertebral body consistent with an old fracture.  CT guided right lower lobe lung biopsy on 05/09/2020 revealed non-small cell carcinoma in a background of significant necrosis. Immunohistochemical studies showed strong staining for CK7, both in viable tumor cells and necrotic debris. There was patchy marking for p40 and p63. TTF-1 and Napsin-A were negative.  This may represent potential transformation to a carcinoma with squamous differentiation or the original biopsy could have represented the glandular component of an adenosquamous carcinoma.  Foundation One on 05/09/2020 revealed no reportable alterations.  Microsatellite status was stable, tumor mutational burden 5 Muts/MB, CDKN2A loss, CDKN2B loss, KRAS G12D, MTAP loss, NF2 E442*, STK11 P203 fs*84 and TP53 Y205S.  There were no reportable alterations in ALK, BRAF, EGFR, ERBB2, MET, RET, and ROS1.  Head CT on 06/26/2020 revealed no evidence of intracranial metastasis.  Chest, abdomen, and pelvis CT on 06/26/2020 revealed a 3.1 x 2.4 cm (previously 3.4 x 3.0 cm) spiculated mass in the superior segment of the RLL.  There was a stable 1.4 x 0.7 cm spiculated subpleural nodule in the posterolateral RUL.  There was a stable high grade vertebral plana deformity of the T4 vertebral body.  There was no evidence of metastatic disease in the abdomen  or pelvis.  There was mild stable splenomegaly (13.6 cm).  Bone scan on 06/26/2020 revealed no evidence of metastatic disease.  PET scan on 07/05/2020 revealed a 3.1 x 2.5 cm mass within superior segment of the RLL with intense FDG uptake (SUV 3.78) c/w metabolically active tumor. Additionally, there were 2 small nodules within the posterior RUL which were FDG avid (1 cm ; SUV 3.84) concerning for additional sites of disease. There was a geographic, curvilinear bandlike area of increased uptake within the posterior left lung base that corresponded to an area of airspace and ground-glass attenuation. The geographic  distribution of this finding favored set inflammatory or infectious process.  He hascancer-related painin T4. He is off the [employer].  He is taking oxycodone10-54m every 4 hours prn.  He has cervicalgia.Cervical spine MRIon 08/14/2019 showed no evidence of metastatic disease within the cervical spine. Heis followedby Dr YIzora Ribas  He has chemotherapy induced anemia.  He received Retacrit on 11/01/2019 (last 08/28/2020).  Hehad transient renal insufficiencyon 03/23/2019. Creatinine was 1.32 (baseline 0.61-1.0). Renal ultrasoundon 03/24/2019 revealed no acute abnormality identified.There was no hydronephrosis or bladder distention.Creatinine is 0.82 today.  Stool waspositive for C difficile + diarrheaon 06/14/2019.Hewas treated with oral vancomycin.  He received the high dose influenza vaccine on 06/11/2020  Symptomatically, he feels "good." He has shortness of breath with exertion. He denies fevers.  Mood is "good."  Exam is unremarkable.  Plan: 1.   Labs: CBC with diff, CMP. 2. Metastatic high-grade adenocarcinoma the RIGHT lung He is s/p 11 cycles of pembrolizumab. He is s/p 1 cycle of carboplatin, Almita, and pembrolizumab (02/08/2019). He is s/p 1 cycle of carboplatin (AUC 2), Alimta, and pembrolizumab (04/18/2019). He is s/p21cycles of Alimta and pembrolizumab (last 08/28/2020). Chest CT angiogram on 09/01/2021revealed slight growth in the RUL mass.  CT guided RLL lung biopsy on 05/09/2020 revealed potential transformation to squamous differentiation.   Foundation One revealed no driver mutations.     He is intolerant to carboplatin at an AUC of 5 as well as 2.  PET scan on 07/05/2020 revealed no evidence of disease progression.   PET scan documented stable disease as compared to images from 08/2019   Decision made to continue Alimta and pembrolizumab based on stability of disease and tolerance of treatment.   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   At some point, his disease will progress and require Taxotere.  Patient would like to postpone until necessary.   He requires Fulphila support secondary to neutropenia at nadir  Review interval CXR (stable).  Discuss plans for restaging studies after next cycle.  Labs reviewed.  Cycle #22 Alimta and pembrolizumab today.  Discuss symptom management.  He has antiemetics and pain medications at home to use on a prn bases.  Interventions are adequate. 3. Bone metastasis Symptomatically, pain is well controlled.  Bone scan on 06/26/2020 revealed no evidence of metastatic disease.  PET scan on 07/05/2020 revealed no evidence of metastatic disease.  He last received Xgeva on 03/12/2020.  XDelton Seewill be reinstituted if bone progression documented. 4.Anxiety and depression Clinically, he is doing well on low-dose Xanax as well as Prozac  Continue to monitor. 5.Pulmonary embolism Continue Eliquis. 6.   Chemotherapy induced anemia  Hematocrit 29.8.  Hemoglobin 9.1.  MCV 89.8 today.  He denies any bleeding.  Diet is good.  Ferritin was 1309 (AFR) with an iron saturation of 29% and a TIBC of 286 on  07/31/2020.  Retacrit today. 7.   Cycle #22 Alimta and pembrolizumab today.  8.   RTC tomorrow for [facility] tomorrow. 9.   RTC in 3 weeks for MD assessment, labs (CBC with diff, CMP, Mg), and cycle #23 Alimta and pembrolizumab.  I discussed the assessment and treatment plan with the patient.  The patient was provided an opportunity to ask questions and all were answered.  The patient agreed with the plan and demonstrated an understanding of the instructions.  The patient was advised to call back if the symptoms worsen or if the condition fails to improve as anticipated.    Lequita Asal, MD, PhD 09/25/2020; 9:33 AM   I, De Burrs, am acting as Education administrator for [employer]. Mike Gip, MD, PhD.  I, Rebbie Lauricella C. Mike Gip, MD, have reviewed the above documentation for accuracy and completeness, and I agree with the above.

## 2020-09-25 ENCOUNTER — Inpatient Hospital Stay (HOSPITAL_BASED_OUTPATIENT_CLINIC_OR_DEPARTMENT_OTHER): Payer: Medicare HMO | Admitting: Hematology and Oncology

## 2020-09-25 ENCOUNTER — Inpatient Hospital Stay: Payer: Medicare HMO

## 2020-09-25 ENCOUNTER — Encounter: Payer: Self-pay | Admitting: Hematology and Oncology

## 2020-09-25 ENCOUNTER — Other Ambulatory Visit: Payer: Self-pay

## 2020-09-25 VITALS — BP 116/64 | HR 57 | Temp 96.3°F | Resp 18 | Wt 134.5 lb

## 2020-09-25 VITALS — BP 102/58 | HR 57

## 2020-09-25 DIAGNOSIS — Z5189 Encounter for other specified aftercare: Secondary | ICD-10-CM | POA: Diagnosis not present

## 2020-09-25 DIAGNOSIS — T451X5A Adverse effect of antineoplastic and immunosuppressive drugs, initial encounter: Secondary | ICD-10-CM | POA: Diagnosis not present

## 2020-09-25 DIAGNOSIS — D701 Agranulocytosis secondary to cancer chemotherapy: Secondary | ICD-10-CM | POA: Diagnosis not present

## 2020-09-25 DIAGNOSIS — Z5112 Encounter for antineoplastic immunotherapy: Secondary | ICD-10-CM

## 2020-09-25 DIAGNOSIS — Z5111 Encounter for antineoplastic chemotherapy: Secondary | ICD-10-CM | POA: Diagnosis not present

## 2020-09-25 DIAGNOSIS — I2782 Chronic pulmonary embolism: Secondary | ICD-10-CM

## 2020-09-25 DIAGNOSIS — C3431 Malignant neoplasm of lower lobe, right bronchus or lung: Secondary | ICD-10-CM | POA: Diagnosis not present

## 2020-09-25 DIAGNOSIS — C7951 Secondary malignant neoplasm of bone: Secondary | ICD-10-CM | POA: Diagnosis not present

## 2020-09-25 DIAGNOSIS — R69 Illness, unspecified: Secondary | ICD-10-CM | POA: Diagnosis not present

## 2020-09-25 DIAGNOSIS — D6481 Anemia due to antineoplastic chemotherapy: Secondary | ICD-10-CM

## 2020-09-25 DIAGNOSIS — C3432 Malignant neoplasm of lower lobe, left bronchus or lung: Secondary | ICD-10-CM | POA: Diagnosis not present

## 2020-09-25 DIAGNOSIS — G893 Neoplasm related pain (acute) (chronic): Secondary | ICD-10-CM | POA: Diagnosis not present

## 2020-09-25 LAB — CBC WITH DIFFERENTIAL/PLATELET
Abs Immature Granulocytes: 0.14 10*3/uL — ABNORMAL HIGH (ref 0.00–0.07)
Basophils Absolute: 0.1 10*3/uL (ref 0.0–0.1)
Basophils Relative: 1 %
Eosinophils Absolute: 0.1 10*3/uL (ref 0.0–0.5)
Eosinophils Relative: 1 %
HCT: 29.8 % — ABNORMAL LOW (ref 39.0–52.0)
Hemoglobin: 9.1 g/dL — ABNORMAL LOW (ref 13.0–17.0)
Immature Granulocytes: 1 %
Lymphocytes Relative: 17 %
Lymphs Abs: 1.7 10*3/uL (ref 0.7–4.0)
MCH: 27.4 pg (ref 26.0–34.0)
MCHC: 30.5 g/dL (ref 30.0–36.0)
MCV: 89.8 fL (ref 80.0–100.0)
Monocytes Absolute: 1 10*3/uL (ref 0.1–1.0)
Monocytes Relative: 10 %
Neutro Abs: 7.2 10*3/uL (ref 1.7–7.7)
Neutrophils Relative %: 70 %
Platelets: 426 10*3/uL — ABNORMAL HIGH (ref 150–400)
RBC: 3.32 MIL/uL — ABNORMAL LOW (ref 4.22–5.81)
RDW: 19.9 % — ABNORMAL HIGH (ref 11.5–15.5)
WBC: 10.3 10*3/uL (ref 4.0–10.5)
nRBC: 0 % (ref 0.0–0.2)

## 2020-09-25 LAB — COMPREHENSIVE METABOLIC PANEL
ALT: 16 U/L (ref 0–44)
AST: 27 U/L (ref 15–41)
Albumin: 3.2 g/dL — ABNORMAL LOW (ref 3.5–5.0)
Alkaline Phosphatase: 94 U/L (ref 38–126)
Anion gap: 9 (ref 5–15)
BUN: 21 mg/dL (ref 8–23)
CO2: 26 mmol/L (ref 22–32)
Calcium: 9.5 mg/dL (ref 8.9–10.3)
Chloride: 105 mmol/L (ref 98–111)
Creatinine, Ser: 1.21 mg/dL (ref 0.61–1.24)
GFR, Estimated: >60 mL/min (ref >60)
Glucose, Bld: 131 mg/dL — ABNORMAL HIGH (ref 70–99)
Potassium: 3.4 mmol/L — ABNORMAL LOW (ref 3.5–5.1)
Sodium: 140 mmol/L (ref 135–145)
Total Bilirubin: 0.6 mg/dL (ref 0.3–1.2)
Total Protein: 6.8 g/dL (ref 6.5–8.1)

## 2020-09-25 MED ORDER — SODIUM CHLORIDE 0.9 % IV SOLN
200.0000 mg | Freq: Once | INTRAVENOUS | Status: AC
  Administered 2020-09-25: 200 mg via INTRAVENOUS
  Filled 2020-09-25: qty 8

## 2020-09-25 MED ORDER — PEMETREXED DISODIUM CHEMO INJECTION 500 MG
700.0000 mg | Freq: Once | INTRAVENOUS | Status: AC
  Administered 2020-09-25: 700 mg via INTRAVENOUS
  Filled 2020-09-25: qty 20

## 2020-09-25 MED ORDER — DEXAMETHASONE SODIUM PHOSPHATE 100 MG/10ML IJ SOLN
10.0000 mg | Freq: Once | INTRAMUSCULAR | Status: AC
  Administered 2020-09-25: 10 mg via INTRAVENOUS
  Filled 2020-09-25: qty 1

## 2020-09-25 MED ORDER — HEPARIN SOD (PORK) LOCK FLUSH 100 UNIT/ML IV SOLN
INTRAVENOUS | Status: AC
  Filled 2020-09-25: qty 5

## 2020-09-25 MED ORDER — SODIUM CHLORIDE 0.9 % IV SOLN
Freq: Once | INTRAVENOUS | Status: AC
  Filled 2020-09-25: qty 250

## 2020-09-25 MED ORDER — EPOETIN ALFA-EPBX 10000 UNIT/ML IJ SOLN
10000.0000 [IU] | Freq: Once | INTRAMUSCULAR | Status: AC
  Administered 2020-09-25: 10000 [IU] via SUBCUTANEOUS

## 2020-09-25 MED ORDER — SODIUM CHLORIDE 0.9 % IV SOLN
Freq: Once | INTRAVENOUS | Status: DC
  Filled 2020-09-25: qty 250

## 2020-09-25 MED ORDER — HEPARIN SOD (PORK) LOCK FLUSH 100 UNIT/ML IV SOLN
500.0000 [IU] | Freq: Once | INTRAVENOUS | Status: AC | PRN
  Administered 2020-09-25: 500 [IU]
  Filled 2020-09-25: qty 5

## 2020-09-25 MED ORDER — PALONOSETRON HCL INJECTION 0.25 MG/5ML
0.2500 mg | Freq: Once | INTRAVENOUS | Status: AC
  Administered 2020-09-25: 0.25 mg via INTRAVENOUS
  Filled 2020-09-25: qty 5

## 2020-09-26 ENCOUNTER — Inpatient Hospital Stay: Payer: Medicare HMO

## 2020-09-26 VITALS — BP 120/71 | HR 84 | Temp 96.5°F | Resp 18

## 2020-09-26 DIAGNOSIS — Z5189 Encounter for other specified aftercare: Secondary | ICD-10-CM | POA: Diagnosis not present

## 2020-09-26 DIAGNOSIS — R69 Illness, unspecified: Secondary | ICD-10-CM | POA: Diagnosis not present

## 2020-09-26 DIAGNOSIS — C3432 Malignant neoplasm of lower lobe, left bronchus or lung: Secondary | ICD-10-CM | POA: Diagnosis not present

## 2020-09-26 DIAGNOSIS — C3431 Malignant neoplasm of lower lobe, right bronchus or lung: Secondary | ICD-10-CM

## 2020-09-26 DIAGNOSIS — C7951 Secondary malignant neoplasm of bone: Secondary | ICD-10-CM | POA: Diagnosis not present

## 2020-09-26 DIAGNOSIS — D6481 Anemia due to antineoplastic chemotherapy: Secondary | ICD-10-CM | POA: Diagnosis not present

## 2020-09-26 DIAGNOSIS — T451X5A Adverse effect of antineoplastic and immunosuppressive drugs, initial encounter: Secondary | ICD-10-CM | POA: Diagnosis not present

## 2020-09-26 DIAGNOSIS — D701 Agranulocytosis secondary to cancer chemotherapy: Secondary | ICD-10-CM | POA: Diagnosis not present

## 2020-09-26 DIAGNOSIS — Z5112 Encounter for antineoplastic immunotherapy: Secondary | ICD-10-CM | POA: Diagnosis not present

## 2020-09-26 DIAGNOSIS — G893 Neoplasm related pain (acute) (chronic): Secondary | ICD-10-CM | POA: Diagnosis not present

## 2020-09-26 DIAGNOSIS — Z5111 Encounter for antineoplastic chemotherapy: Secondary | ICD-10-CM | POA: Diagnosis not present

## 2020-09-26 MED ORDER — PEGFILGRASTIM-CBQV 6 MG/0.6ML ~~LOC~~ SOSY
6.0000 mg | PREFILLED_SYRINGE | Freq: Once | SUBCUTANEOUS | Status: AC
  Administered 2020-09-26: 6 mg via SUBCUTANEOUS

## 2020-09-27 ENCOUNTER — Inpatient Hospital Stay: Payer: Medicare Other

## 2020-09-28 ENCOUNTER — Other Ambulatory Visit: Payer: Self-pay | Admitting: *Deleted

## 2020-09-28 DIAGNOSIS — F419 Anxiety disorder, unspecified: Secondary | ICD-10-CM

## 2020-09-28 DIAGNOSIS — G893 Neoplasm related pain (acute) (chronic): Secondary | ICD-10-CM

## 2020-09-28 MED ORDER — OXYCODONE HCL 20 MG PO TABS: 20.0000 mg | ORAL_TABLET | ORAL | 0 refills | Status: DC | PRN

## 2020-09-28 MED ORDER — ALPRAZOLAM 0.5 MG PO TABS: 0.5000 mg | ORAL_TABLET | Freq: Four times a day (QID) | ORAL | 0 refills | Status: DC | PRN

## 2020-09-30 IMAGING — MR MR CERVICAL SPINE WO/W CM
5 of 8 series · 26 of 48 positions shown · IV contrast (gadavist)
Comparison: Report from radiographs of the cervical spine
08/10/2019 (images unavailable), thoracic spine MRI 05/04/2018, CT
angiogram chest 05/30/2019

CLINICAL DATA: Spine metastasis. Cervical spine pain. Metastatic
lung cancer; severe cervical spine pain. Additional history provided
by technologist: Patient reports pain in neck for 1 year, goes down
into both shoulders and up into top of head, headaches

EXAM:
MRI CERVICAL SPINE WITHOUT AND WITH CONTRAST
TECHNIQUE: Multiplanar and multiecho pulse sequences of the cervical spine, to
include the craniocervical junction and cervicothoracic junction,
were obtained without and with intravenous contrast.
CONTRAST:  6mL GADAVIST GADOBUTROL 1 MMOL/ML IV SOLN

[Series 5: T2 · sagittal · 3.0mm · 0.62mm/px · 4 of 15 slices shown (1 of 2)]
[im 1/15]
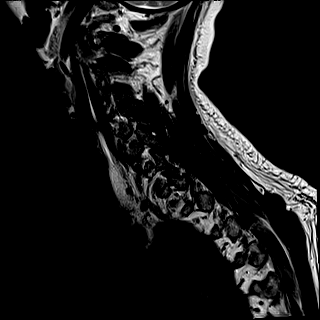
[im 5/15]
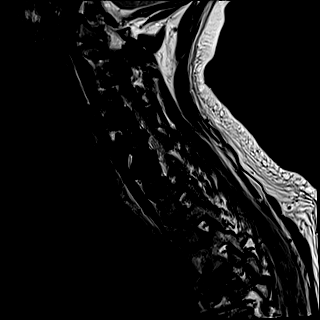
[im 10/15]
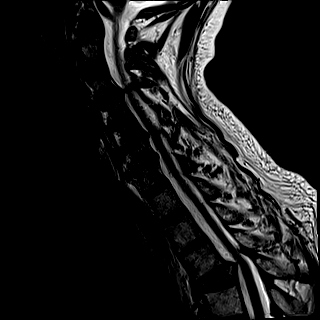
[im 15/15]
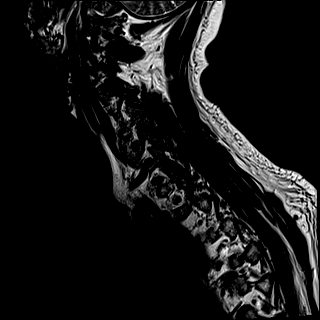

[Series 7: STIR · sagittal · 3.0mm · 0.62mm/px · 4 of 15 slices shown]
[im 1/15]
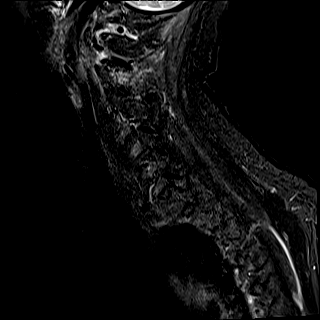
[im 5/15]
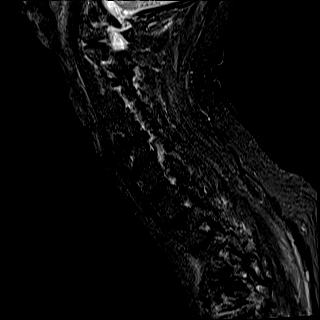
[im 10/15]
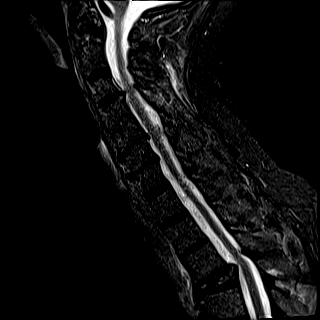
[im 15/15]
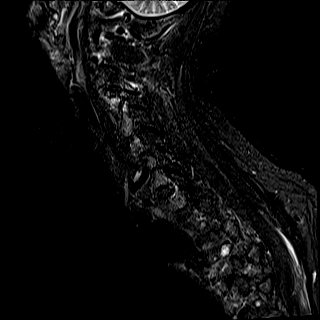

[Series 8: T2 · axial · 3.0mm · 0.70mm/px · z∈[-136,-45]mm · 8 of 29 slices shown (2 of 2)]
[im 1/29]
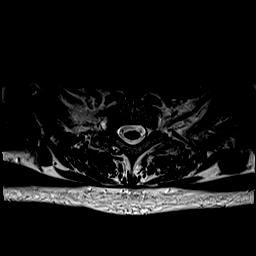
[im 5/29]
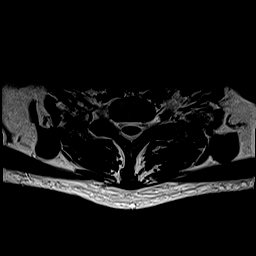
[im 9/29]
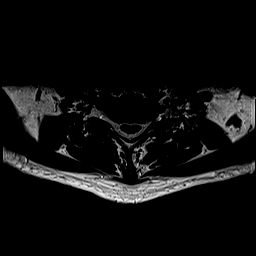
[im 13/29]
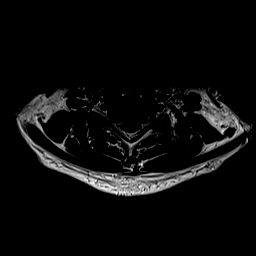
[im 17/29]
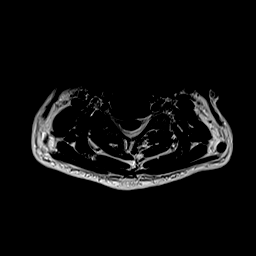
[im 21/29]
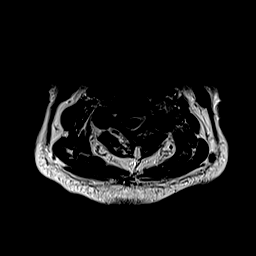
[im 25/29]
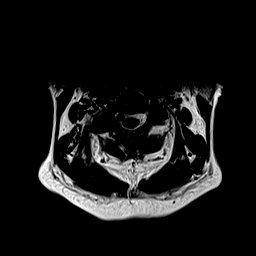
[im 29/29]
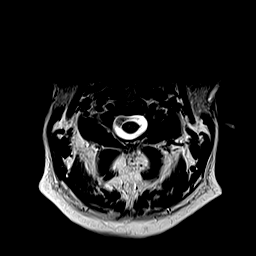

[Series 10: T1 · axial · non-contrast · 3.0mm · 0.35mm/px · z∈[-136,-45]mm · 8 of 29 slices shown]
[im 1/29]
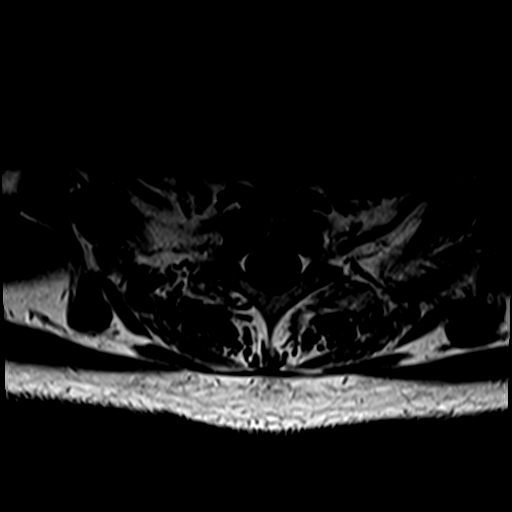
[im 5/29]
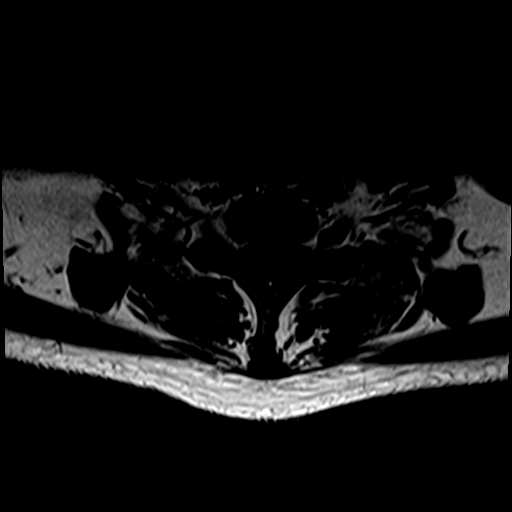
[im 9/29]
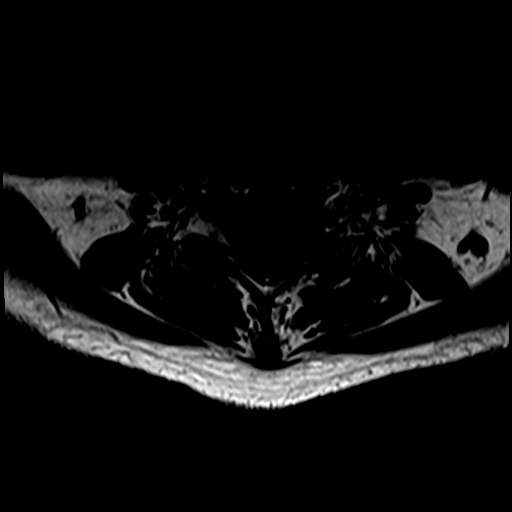
[im 13/29]
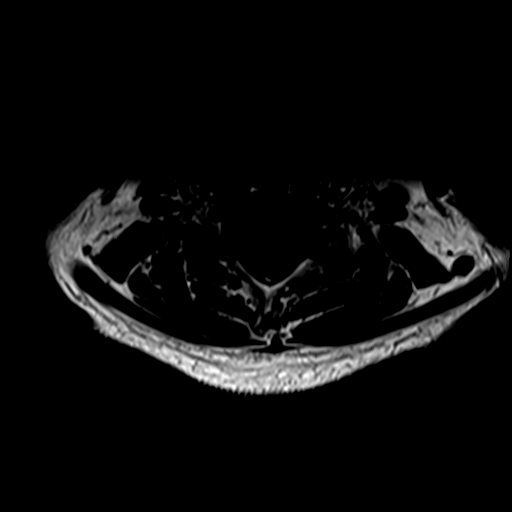
[im 17/29]
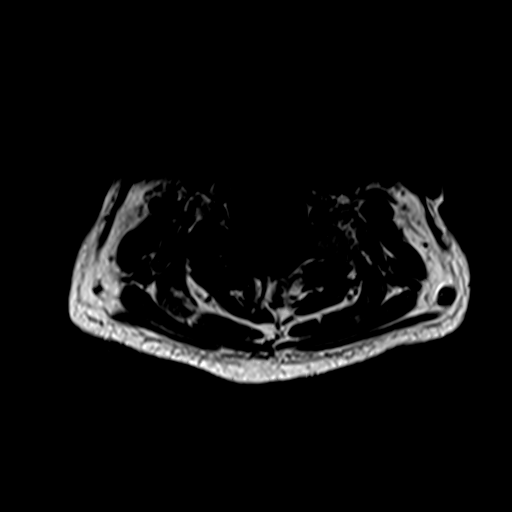
[im 21/29]
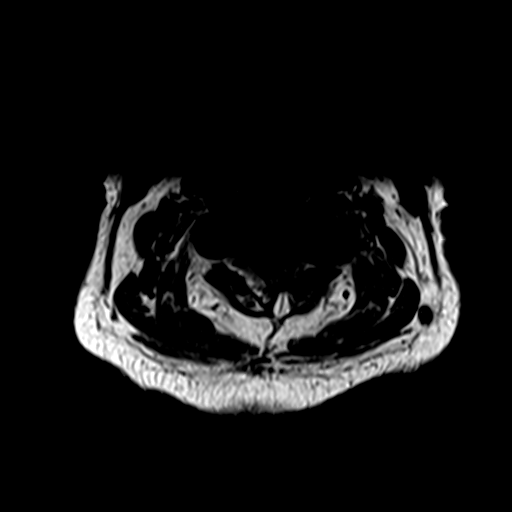
[im 25/29]
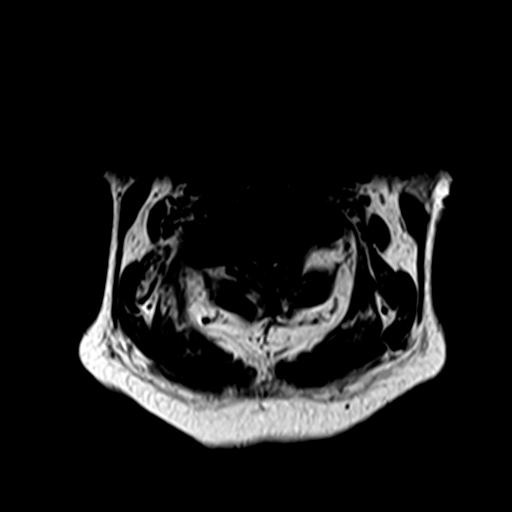
[im 29/29]
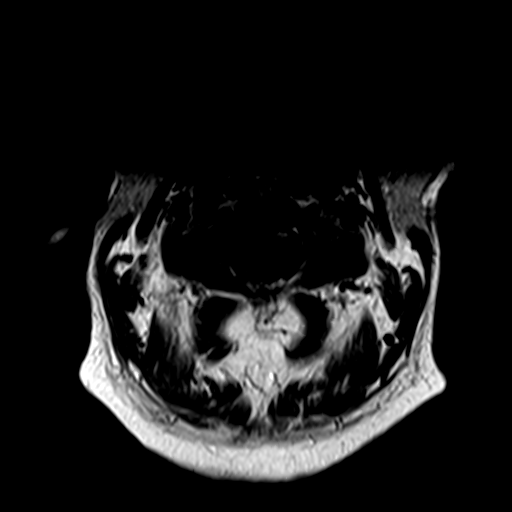

[Series 11: T1 post-contrast · axial · 3.0mm · 0.35mm/px · z∈[-136,-123]mm · 2 of 29 slices shown]
[im 1/29]
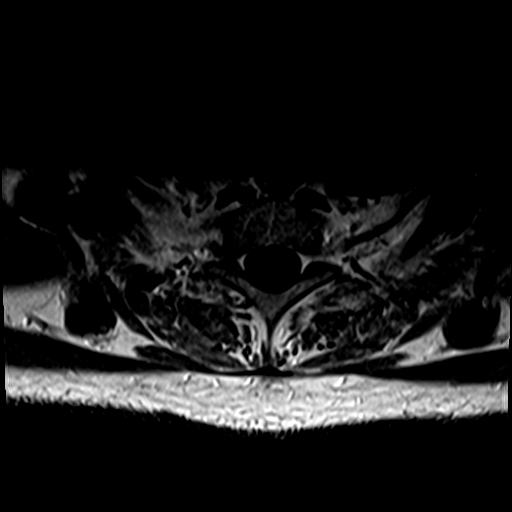
[im 5/29]
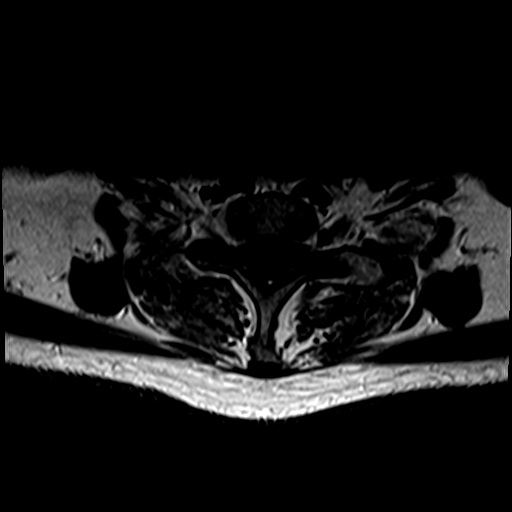

[26 of 48 positions shown; findings below may reference images not displayed]

FINDINGS: Alignment: Straightening of the expected cervical lordosis. Trace
anterolisthesis at the C3-C4 and C4-C5 levels.

Vertebrae: Cervical vertebral body height is maintained. No
suspicious osseous lesion within the cervical spine. Multilevel
degenerative endplate irregularity.

Cord: No definite spinal cord signal abnormality. No abnormal cord
enhancement.

Posterior Fossa, vertebral arteries, paraspinal tissues: No
abnormality identified within included portions of the posterior
fossa. Flow voids preserved within the cervical vertebral arteries.
Paraspinal soft tissues within normal limits.

Disc levels:

Multilevel disc degeneration. Disc degeneration is greatest at C3-C4
(moderate/severe), C5-C6 (moderate) and C6-C7 levels (moderate).
Question early osseous fusion across the posterior aspect of the
C4-C5 disc space.

C2-C3: Trace anterolisthesis. Facet hypertrophy (greater on the
right). No significant spinal canal stenosis. Mild right neural
foraminal narrowing.

C3-C4: Trace anterolisthesis. Disc bulge. Left greater than right
disc osteophyte ridge/uncinate hypertrophy. Facet/ligamentum flavum
hypertrophy. Moderate spinal canal stenosis. Mild right with
moderate/severe left neural foraminal narrowing.

C4-C5: Posterior osteophytosis. Uncinate/facet hypertrophy (greater
on the right). No significant spinal canal stenosis. Mild right
neural foraminal narrowing.

C5-C6: Posterior disc osteophyte complex. Bilateral disc osteophyte
ridge/uncinate hypertrophy. Facet hypertrophy. Mild relative
narrowing of the spinal canal. Moderate/severe bilateral neural
foraminal narrowing.

C6-C7: Posterior disc osteophyte complex. Left greater than right
disc osteophyte ridge/uncinate hypertrophy. Mild relative narrowing
of the spinal canal. Mild right with moderate/severe left neural
foraminal narrowing.

C7-T1: Mild facet hypertrophy (greater on the right). No significant
spinal canal stenosis. Mild relative right neural foraminal
narrowing.

Other: T4 vertebra plana deformity at site of known metastasis,
unchanged from prior CTA chest 05/30/2019. Unchanged 7 mm bony
retropulsion at this level with effacement of the ventral thecal
sac. Contact upon the ventral spinal cord with cord deformity. No
definite spinal cord signal abnormality is identified. Mild
persistent edema and enhancement within the T4 vertebra and
posterior elements.

Mild edema and enhancement within the bilateral pedicles/articular
pillars and within the spinous process at T3. There is also mild
edema and enhancement within the bilateral pedicles/articular
pillars of the T5 vertebra. Findings may be degenerative and related
to instability centered at the T4 level. Additional sites of
metastatic disease considered less likely, but difficult to
definitively exclude.
IMPRESSION: No evidence of metastatic disease within the cervical spine.

Cervical spondylosis as outlined and greatest at C3-C4.

At C3-C4, there is multifactorial moderate spinal canal stenosis
with bilateral neural foraminal narrowing (mild right,
moderate/severe left).

No more than mild spinal canal stenosis at the remaining cervical
levels. Additional sites of neural foraminal narrowing, greatest
bilaterally at C5-C6 and on the left at C6-C7 (moderate/severe at
these sites).

Unchanged T4 vertebral plana deformity at site of known metastasis
with 7 mm bony retropulsion. Persistent mild edema and enhancement
at this site. Bony retropulsion effaces the ventral thecal sac,
contacting and deforming the spinal cord at this level.

Edema and enhancement are also seen within the T3 pedicles/articular
pillars and spinous process as well as T5 pedicles/articular
pillars. Findings are favored degenerative and related to
instability centered at the T4 level. Additional sites of metastatic
disease are considered less likely, but difficult to definitively
exclude.

## 2020-10-03 ENCOUNTER — Other Ambulatory Visit: Payer: Self-pay

## 2020-10-03 ENCOUNTER — Inpatient Hospital Stay: Payer: Medicare HMO

## 2020-10-03 DIAGNOSIS — C3431 Malignant neoplasm of lower lobe, right bronchus or lung: Secondary | ICD-10-CM

## 2020-10-03 DIAGNOSIS — C7951 Secondary malignant neoplasm of bone: Secondary | ICD-10-CM | POA: Diagnosis not present

## 2020-10-03 DIAGNOSIS — T451X5A Adverse effect of antineoplastic and immunosuppressive drugs, initial encounter: Secondary | ICD-10-CM | POA: Diagnosis not present

## 2020-10-03 DIAGNOSIS — Z5189 Encounter for other specified aftercare: Secondary | ICD-10-CM | POA: Diagnosis not present

## 2020-10-03 DIAGNOSIS — R69 Illness, unspecified: Secondary | ICD-10-CM | POA: Diagnosis not present

## 2020-10-03 DIAGNOSIS — C3432 Malignant neoplasm of lower lobe, left bronchus or lung: Secondary | ICD-10-CM | POA: Diagnosis not present

## 2020-10-03 DIAGNOSIS — Z5111 Encounter for antineoplastic chemotherapy: Secondary | ICD-10-CM | POA: Diagnosis not present

## 2020-10-03 DIAGNOSIS — D6481 Anemia due to antineoplastic chemotherapy: Secondary | ICD-10-CM | POA: Diagnosis not present

## 2020-10-03 DIAGNOSIS — G893 Neoplasm related pain (acute) (chronic): Secondary | ICD-10-CM | POA: Diagnosis not present

## 2020-10-03 DIAGNOSIS — Z5112 Encounter for antineoplastic immunotherapy: Secondary | ICD-10-CM | POA: Diagnosis not present

## 2020-10-03 DIAGNOSIS — D701 Agranulocytosis secondary to cancer chemotherapy: Secondary | ICD-10-CM | POA: Diagnosis not present

## 2020-10-03 MED ORDER — CYANOCOBALAMIN 1000 MCG/ML IJ SOLN
1000.0000 ug | Freq: Once | INTRAMUSCULAR | Status: AC
  Administered 2020-10-03: 1000 ug via INTRAMUSCULAR

## 2020-10-04 ENCOUNTER — Other Ambulatory Visit: Payer: Medicare Other | Admitting: Primary Care

## 2020-10-04 ENCOUNTER — Other Ambulatory Visit: Payer: Self-pay

## 2020-10-04 ENCOUNTER — Telehealth: Payer: Self-pay | Admitting: Primary Care

## 2020-10-04 NOTE — Telephone Encounter (Signed)
Called to confirm visit today No answer, will call back to attempt to reschedule.

## 2020-10-10 ENCOUNTER — Other Ambulatory Visit: Payer: Self-pay | Admitting: *Deleted

## 2020-10-10 DIAGNOSIS — F419 Anxiety disorder, unspecified: Secondary | ICD-10-CM

## 2020-10-10 DIAGNOSIS — G893 Neoplasm related pain (acute) (chronic): Secondary | ICD-10-CM

## 2020-10-10 MED ORDER — PROCHLORPERAZINE MALEATE 10 MG PO TABS: 10.0000 mg | ORAL_TABLET | Freq: Four times a day (QID) | ORAL | 0 refills | Status: AC | PRN

## 2020-10-10 MED ORDER — ALPRAZOLAM 0.5 MG PO TABS: 0.5000 mg | ORAL_TABLET | Freq: Four times a day (QID) | ORAL | 0 refills | Status: DC | PRN

## 2020-10-10 MED ORDER — OXYCODONE HCL 20 MG PO TABS: 20.0000 mg | ORAL_TABLET | ORAL | 0 refills | Status: DC | PRN

## 2020-10-10 MED ORDER — OLANZAPINE 5 MG PO TABS: 5.0000 mg | ORAL_TABLET | Freq: Every day | ORAL | 3 refills | Status: DC

## 2020-10-10 NOTE — Addendum Note (Signed)
Addended by: Telford Nab on: 10/10/2020 02:09 PM   Modules accepted: Orders

## 2020-10-10 NOTE — Telephone Encounter (Signed)
Pt requesting refill of compazine, xanax, and oxycodone. Compazine rx has been sent into pharmacy. States that xanax and oxycodone is not due until Sat but wanted to let us know since was requesting other refills as well.

## 2020-10-15 NOTE — Progress Notes (Signed)
[facility]  [address] [place], [place] [address] Phone: [phone]  Fax: [phone]   Clinic Day: 10/15/20  Referring physician: [name], NP  Chief Complaint: Jimmy West is a 70 y.o. male with metastatic adenocarcinoma of the lung who is seen for assessment prior to cycle #23 Alimta and pembrolizumab.  HPI: The patient was last seen in the medical oncology clinic on 09/25/2020. At that time, he felt "good." He had shortness of breath with exertion. He denied fevers.  Mood was "good."  Exam was unremarkable. Hematocrit was 29.8, hemoglobin 9.1, MCV 89.8, platelets 426,000, WBC 10,300 with an ANC of 7200. Potassium was 3.4. He received Retacrit 10,000 units.  He received cycle #22 Alimta and pembrolizumab followed by Udenyca on 09/26/2020.   He received a vitamin B12 injection on 10/03/2020.  During the interim, he has been "good." He still has occasional shortness of breath on exertion. The pin prick sensations on his body have resolved. He denies any pain.  He needs a refill of Eliquis. He took his premedication steroids and daily folate.   Past Medical History:  Diagnosis Date  . Bulging lumbar disc   . Cancer ([place])    stage 4 lung cancer  . Heart attack [facility])     Past Surgical History:  Procedure Laterality Date  . CARDIAC CATHETERIZATION     two stents  . KNEE SURGERY Left   . PORTA CATH INSERTION N/A 05/21/2018   Procedure: PORTA CATH INSERTION;  Surgeon: Algernon Huxley, MD;  Location: [facility];  Service: Cardiovascular;  Laterality: N/A;    Family History  Problem Relation Age of Onset  . Heart failure Father   . Cancer Maternal Aunt   . Heart failure Maternal Uncle   . Dementia Paternal Grandmother   . Cancer Maternal Aunt     Social History:  reports that he quit smoking about 16 years ago. His smoking use included cigarettes. He has a 22.50 pack-year smoking history. He has never used  smokeless tobacco. He reports previous alcohol use. He reports that he does not use drugs. He started smoking at age 24. He is smoking 1 1/2 packs/day. Patient denies known exposures to radiation ortoxins. 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 He has not been working recently as the salon was closed. He plays golf occasionally (none recently).He has 8 cats. The patient is alone today.   Allergies: No Known Allergies  Current Medications: Current Outpatient Medications  Medication Sig Dispense Refill  . albuterol (VENTOLIN HFA) 108 (90 Base) MCG/ACT inhaler Inhale 2 puffs into the lungs every 6 (six) hours as needed for wheezing or shortness of breath. 18 g 3  . ALPRAZolam (XANAX) 0.5 MG tablet Take 1 tablet (0.5 mg total) by mouth 4 (four) times daily as needed for anxiety. 60 tablet 0  . Calcium 600-400 MG-UNIT CHEW Chew 2 tablets by mouth daily.    Marland Kitchen ELIQUIS 5 MG TABS tablet TAKE 1 TABLET BY MOUTH TWICE DAILY 60 tablet 5  . FLUoxetine (PROZAC) 40 MG capsule Take 1 capsule (40 mg total) by mouth daily. 30 capsule 3  . folic acid (FOLVITE) 1 MG tablet Take 1 tablet (1 mg total) by mouth daily. Continue until 21 days after Alimta completed. 30 tablet 3  . OLANZapine (ZYPREXA) 5 MG tablet Take 1 tablet (5 mg total) by mouth at bedtime. 30 tablet 3  . ondansetron (ZOFRAN-ODT) 8 MG disintegrating  tablet DISSOLVE 1 TABLET ON TONGUE EVERY 8 HOURS AS NEEDED FOR NAUSEA OR VOMITING 20 tablet 1  . Oxycodone HCl 20 MG TABS Take 1-1.5 tablets (20-30 mg total) by mouth every 4 (four) hours as needed. 60 tablet 0  . polyethylene glycol (MIRALAX / GLYCOLAX) packet Take 17 g by mouth daily.    . prochlorperazine (COMPAZINE) 10 MG tablet Take 1 tablet (10 mg total) by mouth every 6 (six) hours as needed for nausea or vomiting. 30 tablet 0  . senna (SENOKOT) 8.6 MG tablet Take 1 tablet by mouth as needed.      No current facility-administered medications for this visit.    Facility-Administered Medications Ordered in Other Visits  Medication Dose Route Frequency Provider Last Rate Last Admin  . 0.9 %  sodium chloride infusion   Intravenous Once Corcoran, Melissa C, MD      . 0.9 %  sodium chloride infusion   Intravenous Once PRN Corcoran, Melissa C, MD      . albuterol (PROVENTIL) (2.5 MG/3ML) 0.083% nebulizer solution 2.5 mg  2.5 mg Nebulization Once PRN Corcoran, Melissa C, MD      . alteplase (CATHFLO ACTIVASE) injection 2 mg  2 mg Intracatheter Once PRN Lequita Asal, MD      . EPINEPHrine (ADRENALIN) 1 MG/10ML injection 0.25 mg  0.25 mg Intravenous Once PRN Corcoran, Melissa C, MD      . EPINEPHrine (ADRENALIN) 1 MG/10ML injection 0.25 mg  0.25 mg Intravenous Once PRN Corcoran, Melissa C, MD      . heparin lock flush 100 unit/mL  500 Units Intracatheter Once PRN Mike Gip, Melissa C, MD      . heparin lock flush 100 unit/mL  250 Units Intracatheter Once PRN Nolon Stalls C, MD      . sodium chloride flush (NS) 0.9 % injection 10 mL  10 mL Intravenous PRN Nolon Stalls C, MD   10 mL at 03/04/19 0959  . sodium chloride flush (NS) 0.9 % injection 10 mL  10 mL Intracatheter Once PRN Corcoran, Melissa C, MD      . sodium chloride flush (NS) 0.9 % injection 3 mL  3 mL Intracatheter Once PRN Lequita Asal, MD        Review of Systems  Constitutional: Negative for chills, diaphoresis, fever, malaise/fatigue and weight loss (up 1 lb).       Feels "good."  HENT: Negative.  Negative for congestion, ear discharge, ear pain, hearing loss, nosebleeds, sinus pain, sore throat and tinnitus.   Eyes: Negative.  Negative for blurred vision and double vision.  Respiratory: Positive for shortness of breath (on exertion, occasional). Negative for cough, hemoptysis and sputum production.   Cardiovascular: Negative.  Negative for chest pain, palpitations, orthopnea and leg swelling.  Gastrointestinal: Negative.  Negative for abdominal pain, blood in stool,  constipation, diarrhea, heartburn, melena, nausea and vomiting.  Genitourinary: Negative.  Negative for dysuria, frequency, hematuria and urgency.  Musculoskeletal: Negative.  Negative for back pain, joint pain, myalgias and neck pain.  Skin: Negative.  Negative for itching and rash.  Neurological: Negative.  Negative for dizziness, tingling, sensory change, focal weakness, weakness and headaches.  Endo/Heme/Allergies: Negative.  Negative for environmental allergies and polydipsia. Does not bruise/bleed easily.  Psychiatric/Behavioral: Negative for depression (mood is "good.") and memory loss. The patient is not nervous/anxious (on Xanax) and does not have insomnia.   All other systems reviewed and are negative.  Performance status (ECOG): 0-1  Vitals There were no vitals  taken for this visit.   Physical Exam Vitals and nursing note reviewed.  Constitutional:      General: He is not in acute distress.    Appearance: He is well-developed. He is not diaphoretic.     Interventions: Face mask in place.  HENT:     Head: Normocephalic and atraumatic.     Comments: Thin brown graying hair. Mustache.    Mouth/Throat:     Mouth: Mucous membranes are moist.     Pharynx: Oropharynx is clear.  Eyes:     General: No scleral icterus.    Extraocular Movements: Extraocular movements intact.     Conjunctiva/sclera: Conjunctivae normal.     Pupils: Pupils are equal, round, and reactive to light.     Comments: Glasses.  Gray/blue eyes.  Cardiovascular:     Rate and Rhythm: Normal rate and regular rhythm.     Heart sounds: Normal heart sounds. No murmur heard.   Pulmonary:     Effort: Pulmonary effort is normal. No respiratory distress.     Breath sounds: No wheezing.  Chest:     Chest wall: No tenderness.  Breasts:     Right: No axillary adenopathy or supraclavicular adenopathy.     Left: No axillary adenopathy or supraclavicular adenopathy.    Abdominal:     General: Bowel sounds are  normal. There is no distension.     Palpations: Abdomen is soft. There is no mass.     Tenderness: There is no abdominal tenderness. There is no guarding or rebound.  Musculoskeletal:        General: No swelling or tenderness. Normal range of motion.     Cervical back: Normal range of motion and neck supple.  Lymphadenopathy:     Head:     Right side of head: No preauricular, posterior auricular or occipital adenopathy.     Left side of head: No preauricular, posterior auricular or occipital adenopathy.     Cervical: No cervical adenopathy.     Upper Body:     Right upper body: No supraclavicular or axillary adenopathy.     Left upper body: No supraclavicular or axillary adenopathy.     Lower Body: No right inguinal adenopathy. No left inguinal adenopathy.  Skin:    General: Skin is warm and dry.  Neurological:     Mental Status: He is alert and oriented to person, place, and time. Mental status is at baseline.  Psychiatric:        Behavior: Behavior normal.        Thought Content: Thought content normal.        Judgment: Judgment normal.    Imaging studies: 04/30/2018:  PET scanrevealed a 3.4 cm hypermetabolic RLL pulmonary mass (SUV 13.2), 11 mm pulmonary nodule in the LEFT lung (SUV 4.5), RIGHT paratracheal lymph node (SUV 5.1), and hypermetabolic activity within the T4 vertebral body (SUV 10.2).  05/03/2018:  Thoracic spine MRIon 05/03/2018 revealed T4 metastasiswith a 40% pathologic compression deformity and 5 mm of retropulsion of the vertebral body. Retropulsion results in mild spinal canal stenosis and mild bilateral C4-5 foraminal stenosis. There was paravertebral soft tissue thickening from mid T3 to mid T5, likely representing edema related to the pathologic compression deformity vs. possible extraosseous extension of the neoplasm. There were no additional thoracic spinal metastases noted.  05/03/2018:  Head MRIrevealed no intracranial metastatic disease. There were  mild chronic microvascular ischemic changes and volume loss of brain, in addition to small chronic cortical infarctions within the  left parietal lobe and small right caudate head chronic lacunar infarct. Incidental mention made of mild paranasal sinus disease. 01/10/2019:  Cervical and thoracic spine CT at [facility] unchanged severe compression deformity/vertebral plana of T4 with a stable degree of retropulsion and focal mild spinal canal stenosis. There was interval enlargement of a right lower lobe pulmonary mass(4.5 x 3.5 cm compared to 3.9 x 2.9 cm)with redemonstrated spiculated pulmonary nodules. 01/28/2019:  Chest, abdomen, pelvisCTrevealed slight interval enlargement of a right lower lobe mass with central necrosis measuring 4.6 x 3.4 cm, previously 4.0 x 3.0 cm. There was no change in right upper lobe nodules(1.4 and 0.8 cm).Therewas almost no residua of left lung nodules, with irregular opacities in the apical left upper lobeandsuperior segment left lower lobe. There was no change in right hilar soft tissue and lymph nodes.There wasvertebra plana deformity of T4andno evidence of new osseous metastatic disease.There was no evidence of distant metastatic disease in the abdomen or pelvis.The1.6 x 1.1 cmleft adrenal nodule(non-metabolic on prior PET scan),was unchanged. 03/24/2019:  Renal ultrasoundrevealed no acute abnormality identified.There was no hydronephrosis or bladder distention. 04/23/2019:  Abdomen and pelvis CT revealed no acute abdominal or pelvic pathology. There was fluid in the colon which can be seen with diarrhea. There was diverticulosis without evidence of diverticulitis. There was a stable 1.5 cm left adrenal nodule. 05/06/2019:  Chest, abdomen, and pelvisCTrevealed a positive response to interval therapy for the known right lower lobe lung carcinoma(4.6 x 3.4 cm to3.1 x 2.8 cm transversely). There hadbeen a mild interval decrease contiguous soft  tissue that extends along the right hilum. The two right upper lobe noduleswerestable from the most recent prior study (smaller than 04/27/2018).There are no new lung nodules.Therewasno evidence of new metastatic disease.There was stable severe compression deformity/vertebra plana of T4.Therewasno evidence of other osseous metastatic disease.There was a stable left adrenal nodule consistent with an adenoma. 05/30/2019:  ChestCT angiogramrevealed a tiny subsegmental pulmonary embolusin the right lower lobe. There areas of ground-glass at the periphery of the right chest hadbecome more conspicuous. This mayrelate to mild pneumonitis, perhaps due to radiation, attention on follow-up.The dominant nodule in the right lung basewasstable in size.  08/14/2019:  Cervical spine MRIrevealed no evidence of metastatic disease within the cervical spine. 09/16/2019:  Chest, abdomen and pelvis CTrevealed asimilar-appearing spiculated right lower lobe nodule.There was no evidence for metastatic disease in the abdomen or pelvis.There was stable left adrenal adenoma. 12/15/2019:  Chest CT revealed an enlarging right lower lobe mass c/w malignancy (3.0 x 2.4 cm to 3.3 x 2.7 cm).  There was slight enlargement in a spiculated right upper lobe subpleural nodule (0.8 x 1.5 cm to 1.2 x 0.7 cm). 01/16/2020:  Bone scan revealed no scintigraphic evidence of osseous metastatic disease. 03/21/2020:  Chest CT angiogram revealed no evidence of pulmonary embolism.  A rounded spiculated mass in the RUL measuring 3.4 x 3 cm which was slightly enlarged with adjacent pleural thickening.  There was a stable 1.4 x 0.6 cm irregular subpleural density in the RUL.  There was stable severe compression deformity in the upper thoracic vertebral body consistent with an old fracture. 06/26/2020:  Head CT with or without contrast revealed no evidence of intracranial metastasis. 06/26/2020:  Chest, abdomen, and pelvis CT with  contrast revealed a 3.1 x 2.4 cm (previously 3.4 x 3.0 cm) spiculated mass in the superior segment of the RLL.  There was a stable 1.4 x 0.7 cm spiculated subpleural nodule in the posterolateral RUL.  There was a  stable high grade vertebral plana deformity of the T4 vertebral body.  There was no evidence of metastatic disease in the abdomen or pelvis.  There was mild stable splenomegaly (13.6 cm). 06/26/2020:  Bone scan revealed no evidence of metastatic disease. 07/05/2020:  PET scan revealed a 3.1 x 2.5 cm mass within superior segment of the RLL with intense FDG uptake (SUV 4.09) c/w metabolically active tumor. Additionally, there were 2 small nodules within the posterior RUL which were FDG avid (1 cm ; SUV 3.84) concerning for additional sites of disease. There was a geographic, curvilinear bandlike area of increased uptake within the posterior left lung base that corresponded to an area of airspace and ground-glass attenuation. The geographic distribution of this finding favored set inflammatory or infectious process.   No visits with results within 3 Day(s) from this visit.  Latest known visit with results is:  Infusion on 09/25/2020  Component Date Value Ref Range Status  . Sodium 09/25/2020 140  135 - 145 mmol/L Final  . Potassium 09/25/2020 3.4* 3.5 - 5.1 mmol/L Final  . Chloride 09/25/2020 105  98 - 111 mmol/L Final  . CO2 09/25/2020 26  22 - 32 mmol/L Final  . Glucose, Bld 09/25/2020 131* 70 - 99 mg/dL Final   Glucose reference range applies only to samples taken after fasting for at least 8 hours.  . BUN 09/25/2020 21  8 - 23 mg/dL Final  . Creatinine, Ser 09/25/2020 1.21  0.61 - 1.24 mg/dL Final  . Calcium 09/25/2020 9.5  8.9 - 10.3 mg/dL Final  . Total Protein 09/25/2020 6.8  6.5 - 8.1 g/dL Final  . Albumin 09/25/2020 3.2* 3.5 - 5.0 g/dL Final  . AST 09/25/2020 27  15 - 41 U/L Final  . ALT 09/25/2020 16  0 - 44 U/L Final  . Alkaline Phosphatase 09/25/2020 94  38 - 126 U/L Final  .  Total Bilirubin 09/25/2020 0.6  0.3 - 1.2 mg/dL Final  . GFR, Estimated 09/25/2020 >60  >60 mL/min Final   Comment: (NOTE) Calculated using the CKD-EPI Creatinine Equation (2021)   . Anion gap 09/25/2020 9  5 - 15 Final   Performed at [facility], [address], [place], [place] [address]  . WBC 09/25/2020 10.3  4.0 - 10.5 K/uL Final  . RBC 09/25/2020 3.32* 4.22 - 5.81 MIL/uL Final  . Hemoglobin 09/25/2020 9.1* 13.0 - 17.0 g/dL Final  . HCT 09/25/2020 29.8* 39.0 - 52.0 % Final  . MCV 09/25/2020 89.8  80.0 - 100.0 fL Final  . MCH 09/25/2020 27.4  26.0 - 34.0 pg Final  . MCHC 09/25/2020 30.5  30.0 - 36.0 g/dL Final  . RDW 09/25/2020 19.9* 11.5 - 15.5 % Final  . Platelets 09/25/2020 426* 150 - 400 K/uL Final  . nRBC 09/25/2020 0.0  0.0 - 0.2 % Final  . Neutrophils Relative % 09/25/2020 70  % Final  . Neutro Abs 09/25/2020 7.2  1.7 - 7.7 K/uL Final  . Lymphocytes Relative 09/25/2020 17  % Final  . Lymphs Abs 09/25/2020 1.7  0.7 - 4.0 K/uL Final  . Monocytes Relative 09/25/2020 10  % Final  . Monocytes Absolute 09/25/2020 1.0  0.1 - 1.0 K/uL Final  . Eosinophils Relative 09/25/2020 1  % Final  . Eosinophils Absolute 09/25/2020 0.1  0.0 - 0.5 K/uL Final  . Basophils Relative 09/25/2020 1  % Final  . Basophils Absolute 09/25/2020 0.1  0.0 - 0.1 K/uL Final  . Immature Granulocytes 09/25/2020  1  % Final  . Abs Immature Granulocytes 09/25/2020 0.14* 0.00 - 0.07 K/uL Final   Performed at [facility], [address], [place], [place] [address]    Assessment:  Lexander Tremblay is a 71 y.o. male with metastatichigh-grade adenocarcinomaof the right lungs/p CT-guided biopsy of a RLL lung mass on 05/11/2018. Pathologyrevealed an invasive high-grade adenocarcinoma, with predominantly solid growth pattern. The neoplastic cells were TTF-1 (+), Napsin A (+), and P40 (+). He has a T4 vertebral metastasis. Clinical stage is T4N1M1.  There was not enough material  for Foundation One testing. PDL-1revealed TPS 90%.  PET scanon 04/30/2018 revealed a 3.4 cm hypermetabolic RLL pulmonary mass (SUV 13.2), 11 mm pulmonary nodule in the LEFT lung (SUV 4.5), RIGHT paratracheal lymph node (SUV 5.1), and hypermetabolic activity within the T4 vertebral body (SUV 10.2).   Hereceived 11cycles ofpembrolizumab(05/24/2018 - 02/07/2019). He toleratedtreatment well. CEAwas 1.3 on 08/30/2018.LDHwas 165 on 11/29/2018.  He completed T4 radiationon 07/07/2018. He receives Xgevamonthly (06/03/2018 -03/12/2020).  He received 1 cycle of carboplatin, Alimta, and pembrolizumabon 02/08/2019. Cycle #1 was complicated by nausea, vomiting, and diarrhea necessitating hospitalization.Decision made to hold carboplatin with his second dose.He iss/p2cycles ofAlimta and pembrolizumab (02/28/2019 - 03/29/2019).Cycle #2 was complicated by nausea, vomiting, and dehydration requring fluids in clinic and an overnight stay in the hospital. Hereceivedcarboplatin (AUC 2), Alimta, and pebrolizumab(04/18/2019).He tolerated it poorly.   Hehasreceived22 cycles ofAlimta and pembrolizumabon (05/09/2019 -04/11/2020; 07/11/2020- 09/26/2020). He began Fulphila with cycle #14 secondary to neutropenia.  He has tolerated chemotherapy well. He receives B12every 9 weeks (last03/16/2022).  Chest CT angiogram on 03/21/2020 revealed no evidence of pulmonary embolism.  A rounded spiculated 3.4 x 3 cm mass in the RUL was slightly enlarged with adjacent pleural thickening.  There was a stable 1.4 x 0.6 cm irregular subpleural density in the RUL.  There was stable severe compression deformity in the upper thoracic vertebral body consistent with an old fracture.  CT guided right lower lobe lung biopsy on 05/09/2020 revealed non-small cell carcinoma in a background of significant necrosis. Immunohistochemical studies showed strong staining for CK7, both in viable tumor cells  and necrotic debris. There was patchy marking for p40 and p63. TTF-1 and Napsin-A were negative.  This may represent potential transformation to a carcinoma with squamous differentiation or the original biopsy could have represented the glandular component of an adenosquamous carcinoma.  Foundation One on 05/09/2020 revealed no reportable alterations.  Microsatellite status was stable, tumor mutational burden 5 Muts/MB, CDKN2A loss, CDKN2B loss, KRAS G12D, MTAP loss, NF2 E442*, STK11 P203 fs*84 and TP53 Y205S.  There were no reportable alterations in ALK, BRAF, EGFR, ERBB2, MET, RET, and ROS1.  Head CT on 06/26/2020 revealed no evidence of intracranial metastasis.  Chest, abdomen, and pelvis CT on 06/26/2020 revealed a 3.1 x 2.4 cm (previously 3.4 x 3.0 cm) spiculated mass in the superior segment of the RLL.  There was a stable 1.4 x 0.7 cm spiculated subpleural nodule in the posterolateral RUL.  There was a stable high grade vertebral plana deformity of the T4 vertebral body.  There was no evidence of metastatic disease in the abdomen or pelvis.  There was mild stable splenomegaly (13.6 cm).  Bone scan on 06/26/2020 revealed no evidence of metastatic disease.  PET scan on 07/05/2020 revealed a 3.1 x 2.5 cm mass within superior segment of the RLL with intense FDG uptake (SUV 2.35) c/w metabolically active tumor. Additionally, there were 2 small nodules within the posterior RUL which  were FDG avid (1 cm ; SUV 3.84) concerning for additional sites of disease. There was a geographic, curvilinear bandlike area of increased uptake within the posterior left lung base that corresponded to an area of airspace and ground-glass attenuation. The geographic distribution of this finding favored set inflammatory or infectious process.  He hascancer-related painin T4. He is off the [employer].  He is taking oxycodone10-13m every 4 hours prn.  He has cervicalgia.Cervical spine MRIon 08/14/2019 showed no  evidence of metastatic disease within the cervical spine. Heis followedby Dr YIzora Ribas  He has chemotherapy induced anemia.  He received Retacrit on 11/01/2019 (last 09/25/2020).  Hehad transient renal insufficiencyon 03/23/2019. Creatinine was 1.32 (baseline 0.61-1.0). Renal ultrasoundon 03/24/2019 revealed no acute abnormality identified.There was no hydronephrosis or bladder distention.Creatinine is 0.82 today.  Stool waspositive for C difficile + diarrheaon 06/14/2019.Hewas treated with oral vancomycin.  He received the high dose influenza vaccine on 06/11/2020  Symptomatically, he feels "good." He still has occasional shortness of breath on exertion. The pin prick sensations on his body have resolved. He denies any pain.  Plan: 1.   Labs: CBC with diff, CMP, Mg. 2. Metastatic high-grade adenocarcinoma the RIGHT lung He is s/p 11 cycles of pembrolizumab. He is s/p 1 cycle of carboplatin, Almita, and pembrolizumab (02/08/2019). He is s/p 1 cycle of carboplatin (AUC 2), Alimta, and pembrolizumab (04/18/2019). He is s/p22cycles of Alimta and pembrolizumab (last 09/26/2020). Chest CT angiogram on 09/01/2021revealed slight growth in the RUL mass.  CT guided RLL lung biopsy on 05/09/2020 revealed potential transformation to squamous differentiation.   Foundation One revealed no driver mutations.     He is intolerant to carboplatin at an AUC of 5 as well as 2.  PET scan on 07/05/2020 revealed no evidence of disease progression.   PET scan documented stable disease as compared to images from 08/2019   Decision made to continue Alimta and pembrolizumab based on stability of disease and tolerance of treatment.   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   At some point, his disease will progress and require Taxotere.  Patient would like to postpone until necessary.   He requires Fulphila  support secondary to neutropenia at nadir  Labs reviewed.  Cycle #23 Alimta and pembrolizumab today.  Discuss plan for restaging studies prior to next cycle.    Discuss symptom management.  He has antiemetics and pain medications at home to use on a prn bases.  Interventions are adequate. 3. Bone metastasis Symptomatically, pain remains well controlled.  Bone scan on 06/26/2020 revealed no evidence of metastatic disease.  PET scan on 07/05/2020 revealed no evidence of metastatic disease.  He last received Xgeva on 03/12/2020.  XDelton Seewill be reinstituted if bone progression documented. 4.Anxiety and depression Clinically, he continues to do well on low-dose Xanax as well as Prozac  Continue to monitor. 5.Pulmonary embolism Continue Eliquis. 6.   Chemotherapy induced anemia  Hematocrit 29.5.  Hemoglobin 8.1.  MCV 89.4 today.  He denies any bleeding.  Diet is good.  Ferritin was 1309 (AFR) with an iron saturation of 29% and a TIBC of 286 on 07/31/2020.  Retacrit today. 7.   Cycle #23 Alimta and pembrolizumab today.  8.   Retacrit today. 9.   Chest, abdomen, and pelvis CT on 04/14/32022. 10.   [facility]   RTC in 3 weeks for MD assessment, labs (CBC with diff, CMP, Mg), review of CT  scans, and cycle #24 Alimta and pembrolizumab.  I discussed the assessment and treatment plan with the patient.  The patient was provided an opportunity to ask questions and all were answered.  The patient agreed with the plan and demonstrated an understanding of the instructions.  The patient was advised to call back if the symptoms worsen or if the condition fails to improve as anticipated.    Lequita Asal, MD, PhD 10/15/2020; 9:56 AM   I, De Burrs, am acting as Education administrator for [employer]. Mike Gip, MD, PhD.  I, Melissa C. Mike Gip, MD, have reviewed the above documentation for accuracy and completeness, and I agree with the  above.

## 2020-10-16 ENCOUNTER — Inpatient Hospital Stay (HOSPITAL_BASED_OUTPATIENT_CLINIC_OR_DEPARTMENT_OTHER): Payer: Medicare HMO | Admitting: Hematology and Oncology

## 2020-10-16 ENCOUNTER — Other Ambulatory Visit: Payer: Self-pay

## 2020-10-16 ENCOUNTER — Encounter: Payer: Self-pay | Admitting: Hematology and Oncology

## 2020-10-16 ENCOUNTER — Inpatient Hospital Stay: Payer: Medicare HMO

## 2020-10-16 VITALS — BP 109/71 | HR 60 | Temp 96.0°F | Resp 18 | Wt 135.0 lb

## 2020-10-16 DIAGNOSIS — Z5189 Encounter for other specified aftercare: Secondary | ICD-10-CM | POA: Diagnosis not present

## 2020-10-16 DIAGNOSIS — C7951 Secondary malignant neoplasm of bone: Secondary | ICD-10-CM | POA: Diagnosis not present

## 2020-10-16 DIAGNOSIS — D6481 Anemia due to antineoplastic chemotherapy: Secondary | ICD-10-CM

## 2020-10-16 DIAGNOSIS — T451X5A Adverse effect of antineoplastic and immunosuppressive drugs, initial encounter: Secondary | ICD-10-CM

## 2020-10-16 DIAGNOSIS — D701 Agranulocytosis secondary to cancer chemotherapy: Secondary | ICD-10-CM | POA: Diagnosis not present

## 2020-10-16 DIAGNOSIS — I2782 Chronic pulmonary embolism: Secondary | ICD-10-CM

## 2020-10-16 DIAGNOSIS — Z5111 Encounter for antineoplastic chemotherapy: Secondary | ICD-10-CM | POA: Diagnosis not present

## 2020-10-16 DIAGNOSIS — C3431 Malignant neoplasm of lower lobe, right bronchus or lung: Secondary | ICD-10-CM | POA: Diagnosis not present

## 2020-10-16 DIAGNOSIS — C3432 Malignant neoplasm of lower lobe, left bronchus or lung: Secondary | ICD-10-CM | POA: Diagnosis not present

## 2020-10-16 DIAGNOSIS — Z5112 Encounter for antineoplastic immunotherapy: Secondary | ICD-10-CM

## 2020-10-16 DIAGNOSIS — G893 Neoplasm related pain (acute) (chronic): Secondary | ICD-10-CM | POA: Diagnosis not present

## 2020-10-16 DIAGNOSIS — R69 Illness, unspecified: Secondary | ICD-10-CM | POA: Diagnosis not present

## 2020-10-16 LAB — COMPREHENSIVE METABOLIC PANEL
ALT: 17 U/L (ref 0–44)
AST: 23 U/L (ref 15–41)
Albumin: 3.3 g/dL — ABNORMAL LOW (ref 3.5–5.0)
Alkaline Phosphatase: 88 U/L (ref 38–126)
Anion gap: 7 (ref 5–15)
BUN: 17 mg/dL (ref 8–23)
CO2: 25 mmol/L (ref 22–32)
Calcium: 9.2 mg/dL (ref 8.9–10.3)
Chloride: 105 mmol/L (ref 98–111)
Creatinine, Ser: 1.01 mg/dL (ref 0.61–1.24)
GFR, Estimated: >60 mL/min (ref >60)
Glucose, Bld: 84 mg/dL (ref 70–99)
Potassium: 4.1 mmol/L (ref 3.5–5.1)
Sodium: 137 mmol/L (ref 135–145)
Total Bilirubin: 0.2 mg/dL — ABNORMAL LOW (ref 0.3–1.2)
Total Protein: 7.1 g/dL (ref 6.5–8.1)

## 2020-10-16 LAB — CBC WITH DIFFERENTIAL/PLATELET
Abs Immature Granulocytes: 0.21 10*3/uL — ABNORMAL HIGH (ref 0.00–0.07)
Basophils Absolute: 0 10*3/uL (ref 0.0–0.1)
Basophils Relative: 0 %
Eosinophils Absolute: 0.2 10*3/uL (ref 0.0–0.5)
Eosinophils Relative: 1 %
HCT: 29.5 % — ABNORMAL LOW (ref 39.0–52.0)
Hemoglobin: 9.1 g/dL — ABNORMAL LOW (ref 13.0–17.0)
Immature Granulocytes: 2 %
Lymphocytes Relative: 9 %
Lymphs Abs: 1.1 10*3/uL (ref 0.7–4.0)
MCH: 27.6 pg (ref 26.0–34.0)
MCHC: 30.8 g/dL (ref 30.0–36.0)
MCV: 89.4 fL (ref 80.0–100.0)
Monocytes Absolute: 1.2 10*3/uL — ABNORMAL HIGH (ref 0.1–1.0)
Monocytes Relative: 10 %
Neutro Abs: 9.2 10*3/uL — ABNORMAL HIGH (ref 1.7–7.7)
Neutrophils Relative %: 78 %
Platelets: 417 10*3/uL — ABNORMAL HIGH (ref 150–400)
RBC: 3.3 MIL/uL — ABNORMAL LOW (ref 4.22–5.81)
RDW: 19.2 % — ABNORMAL HIGH (ref 11.5–15.5)
WBC: 11.9 10*3/uL — ABNORMAL HIGH (ref 4.0–10.5)
nRBC: 0 % (ref 0.0–0.2)

## 2020-10-16 LAB — MAGNESIUM: Magnesium: 2 mg/dL (ref 1.7–2.4)

## 2020-10-16 MED ORDER — HEPARIN SOD (PORK) LOCK FLUSH 100 UNIT/ML IV SOLN
INTRAVENOUS | Status: AC
  Filled 2020-10-16: qty 5

## 2020-10-16 MED ORDER — SODIUM CHLORIDE 0.9 % IV SOLN
10.0000 mg | Freq: Once | INTRAVENOUS | Status: AC
  Administered 2020-10-16: 10 mg via INTRAVENOUS
  Filled 2020-10-16: qty 1

## 2020-10-16 MED ORDER — SODIUM CHLORIDE 0.9 % IV SOLN
200.0000 mg | Freq: Once | INTRAVENOUS | Status: AC
  Administered 2020-10-16: 200 mg via INTRAVENOUS
  Filled 2020-10-16: qty 8

## 2020-10-16 MED ORDER — HEPARIN SOD (PORK) LOCK FLUSH 100 UNIT/ML IV SOLN
500.0000 [IU] | Freq: Once | INTRAVENOUS | Status: AC | PRN
  Administered 2020-10-16: 500 [IU]
  Filled 2020-10-16: qty 5

## 2020-10-16 MED ORDER — SODIUM CHLORIDE 0.9 % IV SOLN
Freq: Once | INTRAVENOUS | Status: AC
  Filled 2020-10-16: qty 250

## 2020-10-16 MED ORDER — PALONOSETRON HCL INJECTION 0.25 MG/5ML
0.2500 mg | Freq: Once | INTRAVENOUS | Status: AC
  Administered 2020-10-16: 0.25 mg via INTRAVENOUS
  Filled 2020-10-16: qty 5

## 2020-10-16 MED ORDER — SODIUM CHLORIDE 0.9 % IV SOLN
410.0000 mg/m2 | Freq: Once | INTRAVENOUS | Status: AC
  Administered 2020-10-16: 700 mg via INTRAVENOUS
  Filled 2020-10-16: qty 20

## 2020-10-16 MED ORDER — EPOETIN ALFA-EPBX 10000 UNIT/ML IJ SOLN
10000.0000 [IU] | Freq: Once | INTRAMUSCULAR | Status: AC
  Administered 2020-10-16: 10000 [IU] via SUBCUTANEOUS
  Filled 2020-10-16: qty 1

## 2020-10-16 NOTE — Progress Notes (Signed)
Patient states that things are going well, no new questions or concerns

## 2020-10-16 NOTE — Progress Notes (Signed)
Pt received prescribed treatment in clinic, pt stable at d/c. 

## 2020-10-17 ENCOUNTER — Ambulatory Visit: Payer: Medicare Other | Admitting: Hematology and Oncology

## 2020-10-17 ENCOUNTER — Inpatient Hospital Stay: Payer: Medicare HMO

## 2020-10-17 ENCOUNTER — Ambulatory Visit: Payer: Medicare Other

## 2020-10-17 ENCOUNTER — Other Ambulatory Visit: Payer: Self-pay

## 2020-10-17 ENCOUNTER — Other Ambulatory Visit: Payer: Medicare Other

## 2020-10-17 VITALS — BP 118/74

## 2020-10-17 DIAGNOSIS — G893 Neoplasm related pain (acute) (chronic): Secondary | ICD-10-CM | POA: Diagnosis not present

## 2020-10-17 DIAGNOSIS — Z5112 Encounter for antineoplastic immunotherapy: Secondary | ICD-10-CM | POA: Diagnosis not present

## 2020-10-17 DIAGNOSIS — C7951 Secondary malignant neoplasm of bone: Secondary | ICD-10-CM

## 2020-10-17 DIAGNOSIS — T451X5A Adverse effect of antineoplastic and immunosuppressive drugs, initial encounter: Secondary | ICD-10-CM | POA: Diagnosis not present

## 2020-10-17 DIAGNOSIS — C3431 Malignant neoplasm of lower lobe, right bronchus or lung: Secondary | ICD-10-CM

## 2020-10-17 DIAGNOSIS — Z5189 Encounter for other specified aftercare: Secondary | ICD-10-CM | POA: Diagnosis not present

## 2020-10-17 DIAGNOSIS — C3432 Malignant neoplasm of lower lobe, left bronchus or lung: Secondary | ICD-10-CM | POA: Diagnosis not present

## 2020-10-17 DIAGNOSIS — Z5111 Encounter for antineoplastic chemotherapy: Secondary | ICD-10-CM | POA: Diagnosis not present

## 2020-10-17 DIAGNOSIS — R69 Illness, unspecified: Secondary | ICD-10-CM | POA: Diagnosis not present

## 2020-10-17 DIAGNOSIS — D6481 Anemia due to antineoplastic chemotherapy: Secondary | ICD-10-CM | POA: Diagnosis not present

## 2020-10-17 DIAGNOSIS — D701 Agranulocytosis secondary to cancer chemotherapy: Secondary | ICD-10-CM | POA: Diagnosis not present

## 2020-10-17 MED ORDER — FILGRASTIM-SNDZ 300 MCG/0.5ML IJ SOSY: 300.0000 ug | PREFILLED_SYRINGE | Freq: Once | INTRAMUSCULAR | Status: DC

## 2020-10-17 MED ORDER — PEGFILGRASTIM-CBQV 6 MG/0.6ML ~~LOC~~ SOSY
6.0000 mg | PREFILLED_SYRINGE | Freq: Once | SUBCUTANEOUS | Status: AC
  Administered 2020-10-17: 6 mg via SUBCUTANEOUS
  Filled 2020-10-17: qty 0.6

## 2020-10-17 NOTE — Progress Notes (Signed)
ANC 9.2, MD still wants to give Zarxio.

## 2020-10-23 ENCOUNTER — Other Ambulatory Visit: Payer: Self-pay | Admitting: *Deleted

## 2020-10-23 DIAGNOSIS — I2699 Other pulmonary embolism without acute cor pulmonale: Secondary | ICD-10-CM

## 2020-10-23 DIAGNOSIS — G893 Neoplasm related pain (acute) (chronic): Secondary | ICD-10-CM

## 2020-10-23 DIAGNOSIS — F419 Anxiety disorder, unspecified: Secondary | ICD-10-CM

## 2020-10-23 MED ORDER — ALPRAZOLAM 0.5 MG PO TABS: 0.5000 mg | ORAL_TABLET | Freq: Four times a day (QID) | ORAL | 0 refills | Status: DC | PRN

## 2020-10-23 MED ORDER — ELIQUIS 5 MG PO TABS: 1.0000 | ORAL_TABLET | Freq: Two times a day (BID) | ORAL | 3 refills | Status: AC

## 2020-10-23 MED ORDER — FLUOXETINE HCL 40 MG PO CAPS: 40.0000 mg | ORAL_CAPSULE | Freq: Every day | ORAL | 3 refills | Status: DC

## 2020-10-23 MED ORDER — OXYCODONE HCL 20 MG PO TABS: 20.0000 mg | ORAL_TABLET | ORAL | 0 refills | Status: DC | PRN

## 2020-10-23 NOTE — Addendum Note (Signed)
Addended by: Telford Nab on: 10/23/2020 04:04 PM   Modules accepted: Orders

## 2020-10-23 NOTE — Telephone Encounter (Signed)
Refill request for prozac, xanax, and oxycodone.

## 2020-10-24 ENCOUNTER — Encounter: Payer: Self-pay | Admitting: Pharmacist

## 2020-10-24 ENCOUNTER — Inpatient Hospital Stay: Payer: Medicare HMO | Attending: Hospice and Palliative Medicine | Admitting: Hospice and Palliative Medicine

## 2020-10-24 DIAGNOSIS — Z515 Encounter for palliative care: Secondary | ICD-10-CM

## 2020-10-24 NOTE — Progress Notes (Signed)
Patient was running an errand and could not talk today.  Will reschedule.

## 2020-10-24 NOTE — Progress Notes (Signed)
Oral Chemotherapy Pharmacist Encounter  Dispensed samples to patient:  Medication: Eliquis 5mg  tablets Instructions: Take 1 tablet (5 mg total) by mouth 2 (two) times daily. Quantity dispensed: 14 tablets Days supply: 7 Manufacturer: BMS Lot: [id number] Exp: 08/20/2021  Darl Pikes, PharmD, BCPS, BCOP, CPP Hematology/Oncology Clinical Pharmacist Practitioner [facility] [phone]  10/24/2020 11:40 AM

## 2020-10-31 ENCOUNTER — Telehealth: Payer: Self-pay | Admitting: *Deleted

## 2020-10-31 NOTE — Telephone Encounter (Signed)
Patient called stating that no one has told him if he needs prep or not for his CT tomorrow. Note on appointment states that  Notes: s/w stephanie. arrive at [facility] at [place]. Your provider or the CT Scan department will alert you if you need a prep kit prior to your test. You must be NPO 4 hours prior to the scan. Please arrive 15 minutes prior to your scheduled appointment time. drink at 7am and 8am   Please return call to patient and advise if he needs prep kit

## 2020-11-01 ENCOUNTER — Ambulatory Visit
Admission: RE | Admit: 2020-11-01 | Discharge: 2020-11-01 | Disposition: A | Discharge status: Home / Self Care | Payer: Medicare HMO | Source: Ambulatory Visit | Attending: Hematology and Oncology | Admitting: Hematology and Oncology

## 2020-11-01 ENCOUNTER — Other Ambulatory Visit: Payer: Self-pay

## 2020-11-01 DIAGNOSIS — C7951 Secondary malignant neoplasm of bone: Secondary | ICD-10-CM | POA: Insufficient documentation

## 2020-11-01 DIAGNOSIS — J849 Interstitial pulmonary disease, unspecified: Secondary | ICD-10-CM | POA: Diagnosis not present

## 2020-11-01 DIAGNOSIS — C3431 Malignant neoplasm of lower lobe, right bronchus or lung: Secondary | ICD-10-CM | POA: Insufficient documentation

## 2020-11-01 DIAGNOSIS — I724 Aneurysm of artery of lower extremity: Secondary | ICD-10-CM | POA: Diagnosis not present

## 2020-11-01 DIAGNOSIS — J479 Bronchiectasis, uncomplicated: Secondary | ICD-10-CM | POA: Diagnosis not present

## 2020-11-01 DIAGNOSIS — J439 Emphysema, unspecified: Secondary | ICD-10-CM | POA: Diagnosis not present

## 2020-11-01 DIAGNOSIS — I700 Atherosclerosis of aorta: Secondary | ICD-10-CM | POA: Diagnosis not present

## 2020-11-01 DIAGNOSIS — M4854XA Collapsed vertebra, not elsewhere classified, thoracic region, initial encounter for fracture: Secondary | ICD-10-CM | POA: Diagnosis not present

## 2020-11-01 DIAGNOSIS — I253 Aneurysm of heart: Secondary | ICD-10-CM | POA: Diagnosis not present

## 2020-11-01 DIAGNOSIS — I712 Thoracic aortic aneurysm, without rupture: Secondary | ICD-10-CM | POA: Diagnosis not present

## 2020-11-01 MED ORDER — IOHEXOL 300 MG/ML  SOLN
100.0000 mL | Freq: Once | INTRAMUSCULAR | Status: AC | PRN
  Administered 2020-11-01: 100 mL via INTRAVENOUS

## 2020-11-01 NOTE — Progress Notes (Signed)
[facility]  [address], [address] [place], [place] [address] Phone: [phone]  Fax: [phone]   Clinic Day: 11/06/20  Referring physician: [name], NP  Chief Complaint: Jimmy West is a 70 y.o. 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  HPI: The patient was last seen in the medical oncology clinic on 10/16/2020. At that time, he felt "good." He still had occasional shortness of breath on exertion. The pin prick sensations on his body had resolved. He denied any pain. Hematocrit was 29.5, hemoglobin 9.1, MCV 89.4, platelets 417,000, WBC 11,900. He received Retacrit 10,000 units. He continued Eliquis.  He received cycle #23 Alimta and pembrolizumab followed by Udenyca on 10/17/2020.  Chest, abdomen, and pelvis CT with contrast on 11/01/2020 revealed an enlarging superior segment RLL pulmonary mass (2.7 x 2.5 cm to 3.3 x 3.3 cm). There was slight interval enlargement of the RUL subpleural lesion (11 mm to 14 mm) and a new 13 mm sub solid nodular density in the right upper lobe. Recommended attention on future scans. There was no mediastinal or hilar mass or adenopathy. There were advanced emphysematous changes with associated peripheral interstitial lung disease and patchy areas of bronchiectasis. 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 There were no findings for abdominal/pelvic metastatic disease. There was stable mild splenomegaly. There were stable advanced atherosclerotic calcifications involving the thoracic and abdominal aorta and branch vessels including the coronary arteries. There was emphysema and aortic atherosclerosis.  During the interim, he has been "pretty good." He has been eating more, but eats small meals throughout the day.  He gets short of breath on exertion, but usually he is okay. He recently played golf and was sore for two days afterwards. He also mowed the yard.  He takes calcium 500 mg gummies. He takes them 2-3 x per day. He took his steroids and needs a refill.   Past Medical History:  Diagnosis Date  . Bulging lumbar disc   . Cancer ([place])    stage 4 lung cancer  . Heart attack [facility])     Past Surgical History:  Procedure Laterality Date  . CARDIAC CATHETERIZATION     two stents  . KNEE SURGERY Left   . PORTA CATH INSERTION N/A 05/21/2018   Procedure: PORTA CATH INSERTION;  Surgeon: Algernon Huxley, MD;  Location: [facility];  Service: Cardiovascular;  Laterality: N/A;    Family History  Problem Relation Age of Onset  . Heart failure Father   . Cancer Maternal Aunt   . Heart failure Maternal Uncle   . Dementia Paternal Grandmother   . Cancer Maternal Aunt     Social History:  reports that he quit smoking about 17 years ago. His smoking use included cigarettes. He has a 22.50 pack-year smoking history. He has never used smokeless tobacco. He reports previous alcohol use. He reports that he does not use drugs. He started smoking at age 62. He is smoking 1 1/2 packs/day. Patient denies known exposures to radiation ortoxins. 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 He has not been working recently as the salon was closed. He plays golf occasionally (none recently).He has 8 cats. The patient is alone today.   Allergies: No Known Allergies  Current Medications: Current Outpatient Medications  Medication Sig  Dispense Refill  . albuterol (VENTOLIN HFA) 108 (90 Base) MCG/ACT inhaler Inhale 2 puffs into the lungs every 6 (six) hours as needed for wheezing or shortness of breath. 18 g 3  . ALPRAZolam (XANAX) 0.5 MG tablet Take 1 tablet (0.5 mg total) by mouth 4 (four) times daily as needed for anxiety. 60 tablet 0  . apixaban (ELIQUIS) 5 MG TABS tablet Take 1  tablet (5 mg total) by mouth 2 (two) times daily. 180 tablet 3  . Calcium 600-400 MG-UNIT CHEW Chew 2 tablets by mouth daily.    Marland Kitchen FLUoxetine (PROZAC) 40 MG capsule Take 1 capsule (40 mg total) by mouth daily. 30 capsule 3  . folic acid (FOLVITE) 1 MG tablet Take 1 tablet (1 mg total) by mouth daily. Continue until 21 days after Alimta completed. 30 tablet 3  . OLANZapine (ZYPREXA) 5 MG tablet Take 1 tablet (5 mg total) by mouth at bedtime. 30 tablet 3  . Oxycodone HCl 20 MG TABS Take 1-1.5 tablets (20-30 mg total) by mouth every 4 (four) hours as needed. 60 tablet 0  . polyethylene glycol (MIRALAX / GLYCOLAX) packet Take 17 g by mouth daily.    . prochlorperazine (COMPAZINE) 10 MG tablet Take 1 tablet (10 mg total) by mouth every 6 (six) hours as needed for nausea or vomiting. 30 tablet 0  . senna (SENOKOT) 8.6 MG tablet Take 1 tablet by mouth as needed.     . ondansetron (ZOFRAN-ODT) 8 MG disintegrating tablet DISSOLVE 1 TABLET ON TONGUE EVERY 8 HOURS AS NEEDED FOR NAUSEA OR VOMITING (Patient not taking: Reported on 11/06/2020) 20 tablet 1   No current facility-administered medications for this visit.   Facility-Administered Medications Ordered in Other Visits  Medication Dose Route Frequency Provider Last Rate Last Admin  . 0.9 %  sodium chloride infusion   Intravenous Once Patt Steinhardt C, MD      . 0.9 %  sodium chloride infusion   Intravenous Once PRN Dayle Sherpa C, MD      . albuterol (PROVENTIL) (2.5 MG/3ML) 0.083% nebulizer solution 2.5 mg  2.5 mg Nebulization Once PRN Laelyn Blumenthal C, MD      . alteplase (CATHFLO ACTIVASE) injection 2 mg  2 mg Intracatheter Once PRN Lequita Asal, MD      . EPINEPHrine (ADRENALIN) 1 MG/10ML injection 0.25 mg  0.25 mg Intravenous Once PRN Yates Weisgerber C, MD      . EPINEPHrine (ADRENALIN) 1 MG/10ML injection 0.25 mg  0.25 mg Intravenous Once PRN Shavonte Zhao C, MD      . heparin lock flush 100 unit/mL  500 Units Intracatheter  Once PRN Mike Gip, Azel Gumina C, MD      . heparin lock flush 100 unit/mL  250 Units Intracatheter Once PRN Nolon Stalls C, MD      . heparin lock flush 100 unit/mL  500 Units Intracatheter Once PRN Nolon Stalls C, MD      . sodium chloride flush (NS) 0.9 % injection 10 mL  10 mL Intravenous PRN Nolon Stalls C, MD   10 mL at 03/04/19 0959  . sodium chloride flush (NS) 0.9 % injection 10 mL  10 mL Intracatheter Once PRN Ayinde Swim C, MD      . sodium chloride flush (NS) 0.9 % injection 3 mL  3 mL Intracatheter Once PRN Lequita Asal, MD        Review of Systems  Constitutional: Positive for weight loss (1 lb). Negative for chills, diaphoresis, fever  and malaise/fatigue.       Feels "pretty good."  HENT: Negative.  Negative for congestion, ear discharge, ear pain, hearing loss, nosebleeds, sinus pain, sore throat and tinnitus.   Eyes: Negative.  Negative for blurred vision and double vision.  Respiratory: Positive for shortness of breath (on exertion, occasional). Negative for cough, hemoptysis and sputum production.   Cardiovascular: Negative.  Negative for chest pain, palpitations, orthopnea and leg swelling.  Gastrointestinal: Negative.  Negative for abdominal pain, blood in stool, constipation, diarrhea, heartburn, melena, nausea and vomiting.  Genitourinary: Negative.  Negative for dysuria, frequency, hematuria and urgency.  Musculoskeletal: Negative.  Negative for back pain, joint pain, myalgias and neck pain.  Skin: Negative.  Negative for itching and rash.  Neurological: Negative.  Negative for dizziness, tingling, sensory change, focal weakness, weakness and headaches.  Endo/Heme/Allergies: Negative.  Negative for environmental allergies and polydipsia. Does not bruise/bleed easily.  Psychiatric/Behavioral: Negative for depression and memory loss. The patient is not nervous/anxious (on Xanax) and does not have insomnia.   All other systems reviewed and are  negative.  Performance status (ECOG): 0-1  Vitals Blood pressure 111/71, pulse 66, temperature (!) 97 F (36.1 C), resp. rate 20, weight 134 lb 12.6 oz (61.1 kg), SpO2 98 %.   Physical Exam Vitals and nursing note reviewed.  Constitutional:      General: He is not in acute distress.    Appearance: He is well-developed. He is not diaphoretic.     Interventions: Face mask in place.  HENT:     Head: Normocephalic and atraumatic.     Comments: Thin brown graying hair. Mustache.    Mouth/Throat:     Mouth: Mucous membranes are moist.     Pharynx: Oropharynx is clear.  Eyes:     General: No scleral icterus.    Extraocular Movements: Extraocular movements intact.     Conjunctiva/sclera: Conjunctivae normal.     Pupils: Pupils are equal, round, and reactive to light.     Comments: Glasses.  Gray/blue eyes.  Cardiovascular:     Rate and Rhythm: Normal rate and regular rhythm.     Heart sounds: Normal heart sounds. No murmur heard.   Pulmonary:     Effort: Pulmonary effort is normal. No respiratory distress.     Breath sounds: No wheezing.  Chest:     Chest wall: No tenderness.  Breasts:     Right: No axillary adenopathy or supraclavicular adenopathy.     Left: No axillary adenopathy or supraclavicular adenopathy.    Abdominal:     General: Bowel sounds are normal. There is no distension.     Palpations: Abdomen is soft. There is no mass.     Tenderness: There is no abdominal tenderness. There is no guarding or rebound.  Musculoskeletal:        General: No swelling or tenderness. Normal range of motion.     Cervical back: Normal range of motion and neck supple.  Lymphadenopathy:     Head:     Right side of head: No preauricular, posterior auricular or occipital adenopathy.     Left side of head: No preauricular, posterior auricular or occipital adenopathy.     Cervical: No cervical adenopathy.     Upper Body:     Right upper body: No supraclavicular or axillary adenopathy.      Left upper body: No supraclavicular or axillary adenopathy.     Lower Body: No right inguinal adenopathy. No left inguinal adenopathy.  Skin:  General: Skin is warm and dry.  Neurological:     Mental Status: He is alert and oriented to person, place, and time. Mental status is at baseline.  Psychiatric:        Behavior: Behavior normal.        Thought Content: Thought content normal.        Judgment: Judgment normal.    Imaging studies: 04/30/2018:  PET scanrevealed a 3.4 cm hypermetabolic RLL pulmonary mass (SUV 13.2), 11 mm pulmonary nodule in the LEFT lung (SUV 4.5), RIGHT paratracheal lymph node (SUV 5.1), and hypermetabolic activity within the T4 vertebral body (SUV 10.2).  05/03/2018:  Thoracic spine MRIon 05/03/2018 revealed T4 metastasiswith a 40% pathologic compression deformity and 5 mm of retropulsion of the vertebral body. Retropulsion results in mild spinal canal stenosis and mild bilateral C4-5 foraminal stenosis. There was paravertebral soft tissue thickening from mid T3 to mid T5, likely representing edema related to the pathologic compression deformity vs. possible extraosseous extension of the neoplasm. There were no additional thoracic spinal metastases noted.  05/03/2018:  Head MRIrevealed no intracranial metastatic disease. 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 Incidental mention made of mild paranasal sinus disease. 01/10/2019:  Cervical and thoracic spine CT [facility] unchanged severe compression deformity/vertebral plana of T4 with a stable degree of retropulsion and focal mild spinal canal stenosis. There was interval enlargement of a right lower lobe pulmonary mass(4.5 x 3.5 cm compared to 3.9 x 2.9 cm)with redemonstrated spiculated pulmonary nodules. 01/28/2019:  Chest,  abdomen, pelvisCTrevealed slight interval enlargement of a right lower lobe mass with central necrosis measuring 4.6 x 3.4 cm, previously 4.0 x 3.0 cm. There was no change in right upper lobe nodules(1.4 and 0.8 cm).Therewas almost no residua of left lung nodules, with irregular opacities in the apical left upper lobeandsuperior segment left lower lobe. There was no change in right hilar soft tissue and lymph nodes.There wasvertebra plana deformity of T4andno evidence of new osseous metastatic disease.There was no evidence of distant metastatic disease in the abdomen or pelvis.The1.6 x 1.1 cmleft adrenal nodule(non-metabolic on prior PET scan),was unchanged. 03/24/2019:  Renal ultrasoundrevealed no acute abnormality identified.There was no hydronephrosis or bladder distention. 04/23/2019:  Abdomen and pelvis CT revealed no acute abdominal or pelvic pathology. There was fluid in the colon which can be seen with diarrhea. There was diverticulosis without evidence of diverticulitis. There was a stable 1.5 cm left adrenal nodule. 05/06/2019:  Chest, abdomen, and pelvisCTrevealed a positive response to interval therapy for the known right lower lobe lung carcinoma(4.6 x 3.4 cm to3.1 x 2.8 cm transversely). There hadbeen a mild interval decrease contiguous soft tissue that extends along the right hilum. The two right upper lobe noduleswerestable from the most recent prior study (smaller than 04/27/2018).There are no new lung nodules.Therewasno evidence of new metastatic disease.There was stable severe compression deformity/vertebra plana of T4.Therewasno evidence of other osseous metastatic disease.There was a stable left adrenal nodule consistent with an adenoma. 05/30/2019:  ChestCT angiogramrevealed a tiny subsegmental pulmonary embolusin the right lower lobe. There areas of ground-glass at the periphery of the right chest hadbecome more conspicuous. This mayrelate  to mild pneumonitis, perhaps due to radiation, attention on follow-up.The dominant nodule in the right lung basewasstable in size.  08/14/2019:  Cervical spine MRIrevealed no evidence of metastatic disease within the cervical spine. 09/16/2019:  Chest,  abdomen and pelvis CTrevealed asimilar-appearing spiculated right lower lobe nodule.There was no evidence for metastatic disease in the abdomen or pelvis.There was stable left adrenal adenoma. 12/15/2019:  Chest CT revealed an enlarging right lower lobe mass c/w malignancy (3.0 x 2.4 cm to 3.3 x 2.7 cm).  There was slight enlargement in a spiculated right upper lobe subpleural nodule (0.8 x 1.5 cm to 1.2 x 0.7 cm). 01/16/2020:  Bone scan revealed no scintigraphic evidence of osseous metastatic disease. 03/21/2020:  Chest CT angiogram revealed no evidence of pulmonary embolism.  A rounded spiculated mass in the RUL measuring 3.4 x 3 cm which was slightly enlarged with adjacent pleural thickening.  There was a stable 1.4 x 0.6 cm irregular subpleural density in the RUL.  There was stable severe compression deformity in the upper thoracic vertebral body consistent with an old fracture. 06/26/2020:  Head CT with or without contrast revealed no evidence of intracranial metastasis. 06/26/2020:  Chest, abdomen, and pelvis CT with contrast revealed a 3.1 x 2.4 cm (previously 3.4 x 3.0 cm) spiculated mass in the superior segment of the RLL.  There was a stable 1.4 x 0.7 cm spiculated subpleural nodule in the posterolateral RUL.  There was a stable high grade vertebral plana deformity of the T4 vertebral body.  There was no evidence of metastatic disease in the abdomen or pelvis.  There was mild stable splenomegaly (13.6 cm). 06/26/2020:  Bone scan revealed no evidence of metastatic disease. 07/05/2020:  PET scan revealed a 3.1 x 2.5 cm mass within superior segment of the RLL with intense FDG uptake (SUV 4.70) c/w metabolically active tumor. Additionally, there  were 2 small nodules within the posterior RUL which were FDG avid (1 cm ; SUV 3.84) concerning for additional sites of disease. There was a geographic, curvilinear bandlike area of increased uptake within the posterior left lung base that corresponded to an area of airspace and ground-glass attenuation. The geographic distribution of this finding favored set inflammatory or infectious process. 11/01/2020:  Chest, abdomen, and pelvis CT revealed an enlarging superior segment RLL pulmonary mass (2.7 x 2.5 cm to 3.3 x 3.3 cm). There was slight interval enlargement of the RUL subpleural lesion (11 mm to 14 mm) and a new 13 mm sub solid nodular density in the right upper lobe. Recommended attention on future scans. There was no mediastinal or hilar mass or adenopathy. There were advanced emphysematous changes with associated peripheral interstitial lung disease and patchy areas of bronchiectasis. 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 There were no findings for abdominal/pelvic metastatic disease. There was stable mild splenomegaly. There were stable advanced atherosclerotic calcifications involving the thoracic and abdominal aorta and branch vessels including the coronary arteries. There was emphysema and aortic atherosclerosis.   Infusion on 11/06/2020  Component Date Value Ref Range Status  . Magnesium 11/06/2020 1.8  1.7 - 2.4 mg/dL Final   Performed at [facility], [name] [place], [place] [address]  . Sodium 11/06/2020 136  135 - 145 mmol/L Final  . Potassium 11/06/2020 3.7  3.5 - 5.1 mmol/L Final  . Chloride 11/06/2020 102  98 - 111 mmol/L Final  . CO2 11/06/2020 24  22 - 32 mmol/L Final  . Glucose, Bld 11/06/2020 110* 70 - 99 mg/dL Final   Glucose reference range applies only to samples taken after fasting for at least 8 hours.  . BUN 11/06/2020 21  8 - 23 mg/dL Final  . Creatinine, Ser 11/06/2020 1.13  0.61 - 1.24 mg/dL Final  . Calcium 11/06/2020 10.3  8.9 -  10.3 mg/dL Final  . Total Protein 11/06/2020 7.0  6.5 - 8.1 g/dL Final  . Albumin 11/06/2020 3.2* 3.5 - 5.0 g/dL Final  . AST 11/06/2020 23  15 - 41 U/L Final  . ALT 11/06/2020 15  0 - 44 U/L Final  . Alkaline Phosphatase 11/06/2020 75  38 - 126 U/L Final  . Total Bilirubin 11/06/2020 0.5  0.3 - 1.2 mg/dL Final  . GFR, Estimated 11/06/2020 >60  >60 mL/min Final   Comment: (NOTE) Calculated using the CKD-EPI Creatinine Equation (2021)   . Anion gap 11/06/2020 10  5 - 15 Final   Performed at [facility], [address] [place], [place] [address]  . WBC 11/06/2020 11.6* 4.0 - 10.5 K/uL Final  . RBC 11/06/2020 3.21* 4.22 - 5.81 MIL/uL Final  . Hemoglobin 11/06/2020 9.0* 13.0 - 17.0 g/dL Final  . HCT 11/06/2020 29.1* 39.0 - 52.0 % Final  . MCV 11/06/2020 90.7  80.0 - 100.0 fL Final  . MCH 11/06/2020 28.0  26.0 - 34.0 pg Final  . MCHC 11/06/2020 30.9  30.0 - 36.0 g/dL Final  . RDW 11/06/2020 19.7* 11.5 - 15.5 % Final  . Platelets 11/06/2020 399  150 - 400 K/uL Final  . nRBC 11/06/2020 0.0  0.0 - 0.2 % Final  . Neutrophils Relative % 11/06/2020 86  % Final  . Neutro Abs 11/06/2020 10.0* 1.7 - 7.7 K/uL Final  . Lymphocytes Relative 11/06/2020 6  % Final  . Lymphs Abs 11/06/2020 0.7  0.7 - 4.0 K/uL Final  . Monocytes Relative 11/06/2020 5  % Final  . Monocytes Absolute 11/06/2020 0.6  0.1 - 1.0 K/uL Final  . Eosinophils Relative 11/06/2020 1  % Final  . Eosinophils Absolute 11/06/2020 0.1  0.0 - 0.5 K/uL Final  . Basophils Relative 11/06/2020 0  % Final  . Basophils Absolute 11/06/2020 0.0  0.0 - 0.1 K/uL Final  . Immature Granulocytes 11/06/2020 2  % Final  . Abs Immature Granulocytes 11/06/2020 0.17* 0.00 - 0.07 K/uL Final   Performed at [facility], [address] [place], [place] [address]    Assessment:  Jimmy West is a 70 y.o. male with metastatichigh-grade adenocarcinomaof the right lungs/p CT-guided biopsy of a RLL lung mass on  05/11/2018. Pathologyrevealed an invasive high-grade adenocarcinoma, with predominantly solid growth pattern. The neoplastic cells were TTF-1 (+), Napsin A (+), and P40 (+). He has a T4 vertebral metastasis. Clinical stage is T4N1M1.  There was not enough material for Foundation One testing. PDL-1revealed TPS 90%.  PET scanon 04/30/2018 revealed a 3.4 cm hypermetabolic RLL pulmonary mass (SUV 13.2), 11 mm pulmonary nodule in the LEFT lung (SUV 4.5), RIGHT paratracheal lymph node (SUV 5.1), and hypermetabolic activity within the T4 vertebral body (SUV 10.2).   Hereceived 11cycles ofpembrolizumab(05/24/2018 - 02/07/2019). He toleratedtreatment well. CEAwas 1.3 on 08/30/2018.LDHwas 165 on 11/29/2018.  He completed T4 radiationon 07/07/2018. He receives Xgevamonthly (06/03/2018 -03/12/2020).  He received 1 cycle of carboplatin, Alimta, and pembrolizumabon 02/08/2019. Cycle #1 was complicated by nausea, vomiting, and diarrhea necessitating hospitalization.Decision made to hold carboplatin with his second dose.He iss/p2cycles ofAlimta and pembrolizumab (02/28/2019 - 03/29/2019).Cycle #2 was complicated by nausea, vomiting, and dehydration requring fluids in clinic and an overnight stay in the hospital. Hereceivedcarboplatin (AUC 2), Alimta, and pebrolizumab(04/18/2019).He tolerated it poorly.   Hehasreceived23 cycles ofAlimta and pembrolizumabon (05/09/2019 -04/11/2020; 07/11/2020- 10/16/2020). He began [place]  with cycle #14 secondary to neutropenia.  He has tolerated chemotherapy well. He receives B12every 9 weeks (last03/16/2022).  Chest CT angiogram on 03/21/2020 revealed no evidence of pulmonary embolism.  A rounded spiculated 3.4 x 3 cm mass in the RUL was slightly enlarged with adjacent pleural thickening.  There was a stable 1.4 x 0.6 cm irregular subpleural density in the RUL.  There was stable severe compression deformity in the upper  thoracic vertebral body consistent with an old fracture.  CT guided right lower lobe lung biopsy on 05/09/2020 revealed non-small cell carcinoma in a background of significant necrosis. Immunohistochemical studies showed strong staining for CK7, both in viable tumor cells and necrotic debris. There was patchy marking for p40 and p63. TTF-1 and Napsin-A were negative.  This may represent potential transformation to a carcinoma with squamous differentiation or the original biopsy could have represented the glandular component of an adenosquamous carcinoma.  Foundation One on 05/09/2020 revealed no reportable alterations.  Microsatellite status was stable, tumor mutational burden 5 Muts/MB, CDKN2A loss, CDKN2B loss, KRAS G12D, MTAP loss, NF2 E442*, STK11 P203 fs*84 and TP53 Y205S.  There were no reportable alterations in ALK, BRAF, EGFR, ERBB2, MET, RET, and ROS1.  Head CT on 06/26/2020 revealed no evidence of intracranial metastasis.  Chest, abdomen, and pelvis CT on 06/26/2020 revealed a 3.1 x 2.4 cm (previously 3.4 x 3.0 cm) spiculated mass in the superior segment of the RLL.  There was a stable 1.4 x 0.7 cm spiculated subpleural nodule in the posterolateral RUL.  There was a stable high grade vertebral plana deformity of the T4 vertebral body.  There was no evidence of metastatic disease in the abdomen or pelvis.  There was mild stable splenomegaly (13.6 cm).  Bone scan on 06/26/2020 revealed no evidence of metastatic disease.  Chest, abdomen, and pelvis CT on 11/01/2020 revealed an enlarging superior segment RLL pulmonary mass (2.7 x 2.5 cm to 3.3 x 3.3 cm). There was slight interval enlargement of the RUL subpleural lesion (11 mm to 14 mm) and a new 13 mm sub solid nodular density in the right upper lobe. Recommended attention on future scans. There was no mediastinal or hilar mass or adenopathy. There were advanced emphysematous changes with associated peripheral interstitial lung disease and patchy  areas of bronchiectasis. 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 There were no findings for abdominal/pelvic metastatic disease. There was stable mild splenomegaly. There were stable advanced atherosclerotic calcifications involving the thoracic and abdominal aorta and branch vessels including the coronary arteries. There was emphysema and aortic atherosclerosis.  He hascancer-related painin T4. He is off the [employer].  He is taking oxycodone10-48m every 4 hours prn.  He has cervicalgia.Cervical spine MRIon 08/14/2019 showed no evidence of metastatic disease within the cervical spine. Heis followedby Dr YIzora Ribas  He has chemotherapy induced anemia.  He received Retacrit on 11/01/2019 (last 09/25/2020).  Hehad transient renal insufficiencyon 03/23/2019. Creatinine was 1.32 (baseline 0.61-1.0). Renal ultrasoundon 03/24/2019 revealed no acute abnormality identified.There was no hydronephrosis or bladder distention.Creatinine is 0.82 today.  Stool waspositive for C difficile + diarrheaon 06/14/2019.Hewas treated with oral vancomycin.  He received the high dose influenza vaccine on 06/11/2020  Symptomatically, he feels "pretty good." He has been eating small meals throughout the day. He gets short of breath on exertion. He recently played golf and was sore for two days; he also mowed the yard.  Exam is stable.  Plan: 1.   Labs: CBC with diff, CMP, Mg. 2. Metastatic high-grade  adenocarcinoma the RIGHT lung He is s/p 11 cycles of pembrolizumab. He is s/p 1 cycle of carboplatin, Almita, and pembrolizumab (02/08/2019). He is s/p 1 cycle of carboplatin (AUC 2), Alimta, and pembrolizumab (04/18/2019). He is s/p23cycles of Alimta and pembrolizumab (last 10/16/2020). Chest CT angiogram on 09/01/2021revealed slight growth in the RUL mass.  CT guided RLL lung biopsy on 05/09/2020 revealed potential transformation to squamous differentiation.   Foundation  One revealed no driver mutations.     He is intolerant to carboplatin at an AUC of 5 as well as 2.  PET scan on 07/05/2020 revealed no evidence of disease progression.   PET scan documented stable disease as compared to images from 08/2019   Decision made to continue Alimta and pembrolizumab based on stability of disease and tolerance of treatment.   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   At some point, his disease will progress and require Taxotere.  Patient requested to continue current therapy and postpone a change in therapy until absolutely necessary.   He requires Fulphila support secondary to neutropenia at nadir  Chest, abdomen, and pelvis CT on 11/01/2020 was personally reviewed and discussed with the patient in detail.  Agree with radiology findings.   The RUL mass has increased (2.7 x 2.5 cm to 3.3 x 3.3 cm).    The RUL subpleural lesion has increased slightly (11 mm to 14 mm).   There is a new 13 mm sub solid nodular density in the RUL.    Discuss prior apparent increase in disease (and plan to switch therapy) then follow-up with documented stability.   Discuss eventual need to switch therapy to likely Taxotere given squamous component of disease (not responsive to Alimta).  Discuss his intolerance of carboplatin.   Given patient's lack of symptoms and feeling better than he has in a long time, he wishes to continue current therapy.   Discuss plan for short term follow-up (2-3 cycles).  Patient in agreement.  Labs reviewed.  Cycle #24 Alimta and pembrolizumab today.   Discuss symptom management.  He has antiemetics and pain medications at home to use on a prn bases.  Interventions are adequate.    3. Bone metastasis Symptomatically, pain is well controlled.  Bone scan on 06/26/2020 revealed no evidence of metastatic disease.  PET scan on 07/05/2020 revealed no evidence of  metastatic disease.  He last received Xgeva on 03/12/2020.  Continue to monitor with imaging (PET scan or bone scan).  Delton See will be reinstituted if bone progression documented. 4.Anxiety and depression Clinically, he is doing well on current low dose Xanax and Prozac.  He has become more active.  Continue to monitor. 5.Pulmonary embolism Continue Eliquis. 6.   Chemotherapy induced anemia  Hematocrit 29.1.  Hemoglobin 9.0.  MCV 90.7 today.  He denies any bleeding.  Diet is good.  Ferritin was 1309 (AFR) with an iron saturation of 29% and a TIBC of 286 on 07/31/2020.  Retacrit today. 7.   Hypercalcemia  Calcium 10.3 (corrected 10.94).  Patient on oral calcium.  Check PTH-rp.  Preauth Zometa. 8.   RN: Refill Decadron and other meds per patient request. 9.   Cycle #24 Alimta and pembrolizumab today.  10.   Retacrit today. 11.   RTC tomorrow for [facility] tomorrow. 12.   RTC in 1 week for labs (BMP, albumen) and +/- Zometa. 13.   RTC in 3 weeks for MD assessment, labs (CBC with diff,  CMP, Mg), and cycle #25 Alimta and pembrolizumab.  Addendum:  PTH-rp was < 2.0 (normal).  I discussed the assessment and treatment plan with the patient.  The patient was provided an opportunity to ask questions and all were answered.  The patient agreed with the plan and demonstrated an understanding of the instructions.  The patient was advised to call back if the symptoms worsen or if the condition fails to improve as anticipated.   I provided 22 minutes of face-to-face time during this this encounter and > 50% was spent counseling as documented under my assessment and plan. An additional 10 minutes were spent reviewing his chart (Epic and Care Everywhere) including notes, labs, and imaging studies.    Lequita Asal, MD, PhD 11/06/2020; 11:43 AM   I, De Burrs, am acting as Education administrator for [employer]. Mike Gip, MD, PhD.  I, Roemello Speyer C. Mike Gip, MD, have reviewed the  above documentation for accuracy and completeness, and I agree with the above.

## 2020-11-06 ENCOUNTER — Other Ambulatory Visit: Payer: Self-pay

## 2020-11-06 ENCOUNTER — Encounter: Payer: Self-pay | Admitting: Hematology and Oncology

## 2020-11-06 ENCOUNTER — Telehealth: Payer: Self-pay | Admitting: Hematology and Oncology

## 2020-11-06 ENCOUNTER — Inpatient Hospital Stay: Payer: Medicare HMO

## 2020-11-06 ENCOUNTER — Inpatient Hospital Stay (HOSPITAL_BASED_OUTPATIENT_CLINIC_OR_DEPARTMENT_OTHER): Payer: Medicare HMO | Admitting: Hematology and Oncology

## 2020-11-06 ENCOUNTER — Inpatient Hospital Stay: Payer: Medicare HMO | Attending: Hematology and Oncology

## 2020-11-06 VITALS — BP 111/71 | HR 66 | Temp 97.0°F | Resp 20 | Wt 134.8 lb

## 2020-11-06 DIAGNOSIS — D701 Agranulocytosis secondary to cancer chemotherapy: Secondary | ICD-10-CM | POA: Insufficient documentation

## 2020-11-06 DIAGNOSIS — Z7189 Other specified counseling: Secondary | ICD-10-CM | POA: Diagnosis not present

## 2020-11-06 DIAGNOSIS — Z5189 Encounter for other specified aftercare: Secondary | ICD-10-CM | POA: Diagnosis not present

## 2020-11-06 DIAGNOSIS — G893 Neoplasm related pain (acute) (chronic): Secondary | ICD-10-CM

## 2020-11-06 DIAGNOSIS — R509 Fever, unspecified: Secondary | ICD-10-CM | POA: Diagnosis not present

## 2020-11-06 DIAGNOSIS — Z5112 Encounter for antineoplastic immunotherapy: Secondary | ICD-10-CM

## 2020-11-06 DIAGNOSIS — Z87891 Personal history of nicotine dependence: Secondary | ICD-10-CM | POA: Insufficient documentation

## 2020-11-06 DIAGNOSIS — T451X5D Adverse effect of antineoplastic and immunosuppressive drugs, subsequent encounter: Secondary | ICD-10-CM | POA: Insufficient documentation

## 2020-11-06 DIAGNOSIS — F419 Anxiety disorder, unspecified: Secondary | ICD-10-CM

## 2020-11-06 DIAGNOSIS — C3431 Malignant neoplasm of lower lobe, right bronchus or lung: Secondary | ICD-10-CM

## 2020-11-06 DIAGNOSIS — F418 Other specified anxiety disorders: Secondary | ICD-10-CM | POA: Insufficient documentation

## 2020-11-06 DIAGNOSIS — Z5111 Encounter for antineoplastic chemotherapy: Secondary | ICD-10-CM

## 2020-11-06 DIAGNOSIS — Z7901 Long term (current) use of anticoagulants: Secondary | ICD-10-CM | POA: Insufficient documentation

## 2020-11-06 DIAGNOSIS — R059 Cough, unspecified: Secondary | ICD-10-CM | POA: Diagnosis not present

## 2020-11-06 DIAGNOSIS — R69 Illness, unspecified: Secondary | ICD-10-CM | POA: Diagnosis not present

## 2020-11-06 DIAGNOSIS — C7951 Secondary malignant neoplasm of bone: Secondary | ICD-10-CM | POA: Diagnosis not present

## 2020-11-06 DIAGNOSIS — Z79899 Other long term (current) drug therapy: Secondary | ICD-10-CM | POA: Insufficient documentation

## 2020-11-06 DIAGNOSIS — Z86711 Personal history of pulmonary embolism: Secondary | ICD-10-CM | POA: Insufficient documentation

## 2020-11-06 DIAGNOSIS — D6481 Anemia due to antineoplastic chemotherapy: Secondary | ICD-10-CM | POA: Diagnosis not present

## 2020-11-06 DIAGNOSIS — I2782 Chronic pulmonary embolism: Secondary | ICD-10-CM

## 2020-11-06 LAB — COMPREHENSIVE METABOLIC PANEL
ALT: 15 U/L (ref 0–44)
AST: 23 U/L (ref 15–41)
Albumin: 3.2 g/dL — ABNORMAL LOW (ref 3.5–5.0)
Alkaline Phosphatase: 75 U/L (ref 38–126)
Anion gap: 10 (ref 5–15)
BUN: 21 mg/dL (ref 8–23)
CO2: 24 mmol/L (ref 22–32)
Calcium: 10.3 mg/dL (ref 8.9–10.3)
Chloride: 102 mmol/L (ref 98–111)
Creatinine, Ser: 1.13 mg/dL (ref 0.61–1.24)
GFR, Estimated: >60 mL/min (ref >60)
Glucose, Bld: 110 mg/dL — ABNORMAL HIGH (ref 70–99)
Potassium: 3.7 mmol/L (ref 3.5–5.1)
Sodium: 136 mmol/L (ref 135–145)
Total Bilirubin: 0.5 mg/dL (ref 0.3–1.2)
Total Protein: 7 g/dL (ref 6.5–8.1)

## 2020-11-06 LAB — CBC WITH DIFFERENTIAL/PLATELET
Abs Immature Granulocytes: 0.17 10*3/uL — ABNORMAL HIGH (ref 0.00–0.07)
Basophils Absolute: 0 10*3/uL (ref 0.0–0.1)
Basophils Relative: 0 %
Eosinophils Absolute: 0.1 10*3/uL (ref 0.0–0.5)
Eosinophils Relative: 1 %
HCT: 29.1 % — ABNORMAL LOW (ref 39.0–52.0)
Hemoglobin: 9 g/dL — ABNORMAL LOW (ref 13.0–17.0)
Immature Granulocytes: 2 %
Lymphocytes Relative: 6 %
Lymphs Abs: 0.7 10*3/uL (ref 0.7–4.0)
MCH: 28 pg (ref 26.0–34.0)
MCHC: 30.9 g/dL (ref 30.0–36.0)
MCV: 90.7 fL (ref 80.0–100.0)
Monocytes Absolute: 0.6 10*3/uL (ref 0.1–1.0)
Monocytes Relative: 5 %
Neutro Abs: 10 10*3/uL — ABNORMAL HIGH (ref 1.7–7.7)
Neutrophils Relative %: 86 %
Platelets: 399 10*3/uL (ref 150–400)
RBC: 3.21 MIL/uL — ABNORMAL LOW (ref 4.22–5.81)
RDW: 19.7 % — ABNORMAL HIGH (ref 11.5–15.5)
WBC: 11.6 10*3/uL — ABNORMAL HIGH (ref 4.0–10.5)
nRBC: 0 % (ref 0.0–0.2)

## 2020-11-06 LAB — MAGNESIUM: Magnesium: 1.8 mg/dL (ref 1.7–2.4)

## 2020-11-06 MED ORDER — HEPARIN SOD (PORK) LOCK FLUSH 100 UNIT/ML IV SOLN
500.0000 [IU] | Freq: Once | INTRAVENOUS | Status: AC | PRN
  Administered 2020-11-06: 500 [IU]
  Filled 2020-11-06: qty 5

## 2020-11-06 MED ORDER — OXYCODONE HCL 20 MG PO TABS: 20.0000 mg | ORAL_TABLET | ORAL | 0 refills | Status: DC | PRN

## 2020-11-06 MED ORDER — CYANOCOBALAMIN 1000 MCG/ML IJ SOLN
1000.0000 ug | Freq: Once | INTRAMUSCULAR | Status: AC
  Administered 2020-11-06: 1000 ug via INTRAMUSCULAR
  Filled 2020-11-06: qty 1

## 2020-11-06 MED ORDER — SODIUM CHLORIDE 0.9 % IV SOLN
200.0000 mg | Freq: Once | INTRAVENOUS | Status: AC
  Administered 2020-11-06: 200 mg via INTRAVENOUS
  Filled 2020-11-06: qty 8

## 2020-11-06 MED ORDER — SODIUM CHLORIDE 0.9 % IV SOLN
Freq: Once | INTRAVENOUS | Status: AC
Start: 2020-11-06 — End: 2020-11-06
  Filled 2020-11-06: qty 250

## 2020-11-06 MED ORDER — HEPARIN SOD (PORK) LOCK FLUSH 100 UNIT/ML IV SOLN
INTRAVENOUS | Status: AC
  Filled 2020-11-06: qty 5

## 2020-11-06 MED ORDER — ALPRAZOLAM 0.5 MG PO TABS: 0.5000 mg | ORAL_TABLET | Freq: Four times a day (QID) | ORAL | 0 refills | Status: DC | PRN

## 2020-11-06 MED ORDER — DEXAMETHASONE 4 MG PO TABS
ORAL_TABLET | ORAL | 1 refills | Status: DC
Start: 2020-11-06 — End: 2021-02-05

## 2020-11-06 MED ORDER — SODIUM CHLORIDE 0.9 % IV SOLN
700.0000 mg | Freq: Once | INTRAVENOUS | Status: AC
  Administered 2020-11-06: 700 mg via INTRAVENOUS
  Filled 2020-11-06: qty 20

## 2020-11-06 NOTE — Telephone Encounter (Signed)
Patient was here today and stated needed refills  Patient called to follow up on it - says called pharmacy and they didn't have it.   Said pharmacy closes at [place]   Thank you!

## 2020-11-06 NOTE — Progress Notes (Signed)
Pt reports he needs a refill for oxycodone and xanax.

## 2020-11-06 NOTE — Patient Instructions (Addendum)
Stop calcium.  Zoledronic Acid Injection (Hypercalcemia, Oncology) What is this medicine? ZOLEDRONIC ACID (ZOE le dron ik AS id) slows calcium loss from bones. It high calcium levels in the blood from some kinds of cancer. It may be used in other people at risk for bone loss. This medicine may be used for other purposes; ask your health care provider or pharmacist if you have questions. COMMON BRAND NAME(S): Zometa What should I tell my health care provider before I take this medicine? They need to know if you have any of these conditions:  cancer  dehydration  dental disease  kidney disease  liver disease  low levels of calcium in the blood  lung or breathing disease (asthma)  receiving steroids like dexamethasone or prednisone  an unusual or allergic reaction to zoledronic acid, other medicines, foods, dyes, or preservatives  pregnant or trying to get pregnant  breast-feeding How should I use this medicine? This drug is injected into a vein. It is given by a health care provider in a hospital or clinic setting. Talk to your health care provider about the use of this drug in children. Special care may be needed. Overdosage: If you think you have taken too much of this medicine contact a poison control center or emergency room at once. NOTE: This medicine is only for you. Do not share this medicine with others. What if I miss a dose? Keep appointments for follow-up doses. It is important not to miss your dose. Call your health care provider if you are unable to keep an appointment. What may interact with this medicine?  certain antibiotics given by injection  NSAIDs, medicines for pain and inflammation, like ibuprofen or naproxen  some diuretics like bumetanide, furosemide  teriparatide  thalidomide This list may not describe all possible interactions. Give your health care provider a list of all the medicines, herbs, non-prescription drugs, or dietary supplements  you use. Also tell them if you smoke, drink alcohol, or use illegal drugs. Some items may interact with your medicine. What should I watch for while using this medicine? Visit your health care provider for regular checks on your progress. It may be some time before you see the benefit from this drug. Some people who take this drug have severe bone, joint, or muscle pain. This drug may also increase your risk for jaw problems or a broken thigh bone. Tell your health care provider right away if you have severe pain in your jaw, bones, joints, or muscles. Tell you health care provider if you have any pain that does not go away or that gets worse. Tell your dentist and dental surgeon that you are taking this drug. You should not have major dental surgery while on this drug. See your dentist to have a dental exam and fix any dental problems before starting this drug. Take good care of your teeth while on this drug. Make sure you see your dentist for regular follow-up appointments. You should make sure you get enough calcium and vitamin D while you are taking this drug. Discuss the foods you eat and the vitamins you take with your health care provider. Check with your health care provider if you have severe diarrhea, nausea, and vomiting, or if you sweat a lot. The loss of too much body fluid may make it dangerous for you to take this drug. You may need blood work done while you are taking this drug. Do not become pregnant while taking this drug. Women should inform their  health care provider if they wish to become pregnant or think they might be pregnant. There is potential for serious harm to an unborn child. Talk to your health care provider for more information. What side effects may I notice from receiving this medicine? Side effects that you should report to your doctor or health care provider as soon as possible:  allergic reactions (skin rash, itching or hives; swelling of the face, lips, or  tongue)  bone pain  infection (fever, chills, cough, sore throat, pain or trouble passing urine)  jaw pain, especially after dental work  joint pain  kidney injury (trouble passing urine or change in the amount of urine)  low blood pressure (dizziness; feeling faint or lightheaded, falls; unusually weak or tired)  low calcium levels (fast heartbeat; muscle cramps or pain; pain, tingling, or numbness in the hands or feet; seizures)  low magnesium levels (fast, irregular heartbeat; muscle cramp or pain; muscle weakness; tremors; seizures)  low red blood cell counts (trouble breathing; feeling faint; lightheaded, falls; unusually weak or tired)  muscle pain  redness, blistering, peeling, or loosening of the skin, including inside the mouth  severe diarrhea  swelling of the ankles, feet, hands  trouble breathing Side effects that usually do not require medical attention (report to your doctor or health care provider if they continue or are bothersome):  anxious  constipation  coughing  depressed mood  eye irritation, itching, or pain  fever  general ill feeling or flu-like symptoms  nausea  pain, redness, or irritation at site where injected  trouble sleeping This list may not describe all possible side effects. Call your doctor for medical advice about side effects. You may report side effects to FDA at 1-800-FDA-1088. Where should I keep my medicine? This drug is given in a hospital or clinic. It will not be stored at home. NOTE: This sheet is a summary. It may not cover all possible information. If you have questions about this medicine, talk to your doctor, pharmacist, or health care provider.  2021 Elsevier/Gold Standard (2019-04-21 09:13:00)

## 2020-11-07 ENCOUNTER — Other Ambulatory Visit: Payer: Self-pay

## 2020-11-07 ENCOUNTER — Inpatient Hospital Stay: Payer: Medicare HMO

## 2020-11-07 VITALS — BP 147/82 | HR 70 | Resp 20

## 2020-11-07 DIAGNOSIS — D6481 Anemia due to antineoplastic chemotherapy: Secondary | ICD-10-CM | POA: Diagnosis not present

## 2020-11-07 DIAGNOSIS — Z5112 Encounter for antineoplastic immunotherapy: Secondary | ICD-10-CM | POA: Diagnosis not present

## 2020-11-07 DIAGNOSIS — C3431 Malignant neoplasm of lower lobe, right bronchus or lung: Secondary | ICD-10-CM | POA: Diagnosis not present

## 2020-11-07 DIAGNOSIS — C7951 Secondary malignant neoplasm of bone: Secondary | ICD-10-CM | POA: Diagnosis not present

## 2020-11-07 DIAGNOSIS — D701 Agranulocytosis secondary to cancer chemotherapy: Secondary | ICD-10-CM | POA: Diagnosis not present

## 2020-11-07 DIAGNOSIS — T451X5D Adverse effect of antineoplastic and immunosuppressive drugs, subsequent encounter: Secondary | ICD-10-CM | POA: Diagnosis not present

## 2020-11-07 DIAGNOSIS — G893 Neoplasm related pain (acute) (chronic): Secondary | ICD-10-CM | POA: Diagnosis not present

## 2020-11-07 DIAGNOSIS — Z5189 Encounter for other specified aftercare: Secondary | ICD-10-CM | POA: Diagnosis not present

## 2020-11-07 DIAGNOSIS — Z5111 Encounter for antineoplastic chemotherapy: Secondary | ICD-10-CM | POA: Diagnosis not present

## 2020-11-07 DIAGNOSIS — R69 Illness, unspecified: Secondary | ICD-10-CM | POA: Diagnosis not present

## 2020-11-07 MED ORDER — PEGFILGRASTIM-CBQV 6 MG/0.6ML ~~LOC~~ SOSY
6.0000 mg | PREFILLED_SYRINGE | Freq: Once | SUBCUTANEOUS | Status: AC
  Administered 2020-11-07: 6 mg via SUBCUTANEOUS
  Filled 2020-11-07: qty 0.6

## 2020-11-11 LAB — PTH-RELATED PEPTIDE: PTH-related peptide: <2 pmol/L

## 2020-11-13 ENCOUNTER — Other Ambulatory Visit: Payer: Self-pay

## 2020-11-13 ENCOUNTER — Inpatient Hospital Stay: Payer: Medicare HMO

## 2020-11-13 VITALS — BP 100/62 | HR 90 | Temp 98.4°F | Resp 18

## 2020-11-13 DIAGNOSIS — C3431 Malignant neoplasm of lower lobe, right bronchus or lung: Secondary | ICD-10-CM

## 2020-11-13 DIAGNOSIS — G893 Neoplasm related pain (acute) (chronic): Secondary | ICD-10-CM

## 2020-11-13 DIAGNOSIS — Z5112 Encounter for antineoplastic immunotherapy: Secondary | ICD-10-CM | POA: Diagnosis not present

## 2020-11-13 DIAGNOSIS — D6481 Anemia due to antineoplastic chemotherapy: Secondary | ICD-10-CM | POA: Diagnosis not present

## 2020-11-13 DIAGNOSIS — C7951 Secondary malignant neoplasm of bone: Secondary | ICD-10-CM | POA: Diagnosis not present

## 2020-11-13 DIAGNOSIS — Z5189 Encounter for other specified aftercare: Secondary | ICD-10-CM | POA: Diagnosis not present

## 2020-11-13 DIAGNOSIS — D701 Agranulocytosis secondary to cancer chemotherapy: Secondary | ICD-10-CM | POA: Diagnosis not present

## 2020-11-13 DIAGNOSIS — Z5111 Encounter for antineoplastic chemotherapy: Secondary | ICD-10-CM

## 2020-11-13 DIAGNOSIS — Z7901 Long term (current) use of anticoagulants: Secondary | ICD-10-CM

## 2020-11-13 DIAGNOSIS — R69 Illness, unspecified: Secondary | ICD-10-CM | POA: Diagnosis not present

## 2020-11-13 DIAGNOSIS — T451X5D Adverse effect of antineoplastic and immunosuppressive drugs, subsequent encounter: Secondary | ICD-10-CM | POA: Diagnosis not present

## 2020-11-13 LAB — BASIC METABOLIC PANEL
Anion gap: 11 (ref 5–15)
BUN: 22 mg/dL (ref 8–23)
CO2: 24 mmol/L (ref 22–32)
Calcium: 9.2 mg/dL (ref 8.9–10.3)
Chloride: 99 mmol/L (ref 98–111)
Creatinine, Ser: 1.21 mg/dL (ref 0.61–1.24)
GFR, Estimated: >60 mL/min (ref >60)
Glucose, Bld: 117 mg/dL — ABNORMAL HIGH (ref 70–99)
Potassium: 3.8 mmol/L (ref 3.5–5.1)
Sodium: 134 mmol/L — ABNORMAL LOW (ref 135–145)

## 2020-11-13 LAB — ALBUMIN: Albumin: 3.6 g/dL (ref 3.5–5.0)

## 2020-11-13 MED ORDER — SODIUM CHLORIDE 0.9% FLUSH
10.0000 mL | INTRAVENOUS | Status: DC | PRN
  Administered 2020-11-13: 10 mL via INTRAVENOUS
  Filled 2020-11-13: qty 10

## 2020-11-13 MED ORDER — ZOLEDRONIC ACID 4 MG/5ML IV CONC
3.5000 mg | Freq: Once | INTRAVENOUS | Status: AC
  Administered 2020-11-13: 3.5 mg via INTRAVENOUS
  Filled 2020-11-13: qty 4.38

## 2020-11-13 MED ORDER — HEPARIN SOD (PORK) LOCK FLUSH 100 UNIT/ML IV SOLN
500.0000 [IU] | Freq: Once | INTRAVENOUS | Status: AC
  Administered 2020-11-13: 500 [IU] via INTRAVENOUS
  Filled 2020-11-13: qty 5

## 2020-11-13 MED ORDER — SODIUM CHLORIDE 0.9 % IV SOLN
Freq: Once | INTRAVENOUS | Status: AC
Start: 2020-11-13 — End: 2020-11-13
  Filled 2020-11-13: qty 250

## 2020-11-15 ENCOUNTER — Other Ambulatory Visit: Payer: Self-pay | Admitting: *Deleted

## 2020-11-15 DIAGNOSIS — G893 Neoplasm related pain (acute) (chronic): Secondary | ICD-10-CM

## 2020-11-15 MED ORDER — OXYCODONE HCL 20 MG PO TABS: 20.0000 mg | ORAL_TABLET | ORAL | 0 refills | Status: DC | PRN

## 2020-11-20 ENCOUNTER — Other Ambulatory Visit: Payer: Self-pay | Admitting: *Deleted

## 2020-11-20 DIAGNOSIS — F419 Anxiety disorder, unspecified: Secondary | ICD-10-CM

## 2020-11-20 MED ORDER — ALPRAZOLAM 0.5 MG PO TABS: 0.5000 mg | ORAL_TABLET | Freq: Four times a day (QID) | ORAL | 0 refills | Status: DC | PRN

## 2020-11-21 ENCOUNTER — Inpatient Hospital Stay: Payer: Medicare HMO | Attending: Hospice and Palliative Medicine | Admitting: Hospice and Palliative Medicine

## 2020-11-21 DIAGNOSIS — G893 Neoplasm related pain (acute) (chronic): Secondary | ICD-10-CM | POA: Insufficient documentation

## 2020-11-21 DIAGNOSIS — Z515 Encounter for palliative care: Secondary | ICD-10-CM

## 2020-11-21 DIAGNOSIS — C3431 Malignant neoplasm of lower lobe, right bronchus or lung: Secondary | ICD-10-CM

## 2020-11-21 DIAGNOSIS — F32A Depression, unspecified: Secondary | ICD-10-CM | POA: Insufficient documentation

## 2020-11-21 DIAGNOSIS — K5900 Constipation, unspecified: Secondary | ICD-10-CM | POA: Insufficient documentation

## 2020-11-21 DIAGNOSIS — F419 Anxiety disorder, unspecified: Secondary | ICD-10-CM | POA: Insufficient documentation

## 2020-11-21 DIAGNOSIS — Z5112 Encounter for antineoplastic immunotherapy: Secondary | ICD-10-CM | POA: Insufficient documentation

## 2020-11-21 MED ORDER — FLUOXETINE HCL 40 MG PO CAPS: 40.0000 mg | ORAL_CAPSULE | Freq: Every day | ORAL | 1 refills | Status: DC

## 2020-11-21 NOTE — Progress Notes (Signed)
Virtual Visit via Telephone Note  I connected with Jimmy West on 11/21/20 at 10:30 AM EDT by telephone and verified that I am speaking with the correct person using two identifiers.   I discussed the limitations, risks, security and privacy concerns of performing an evaluation and management service by telephone and the availability of in person appointments. I also discussed with the patient that there may be a patient responsible charge related to this service. The patient expressed understanding and agreed to proceed.   History of Present Illness: Jimmy West is a 70 year old male with multiple medical problems including 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 Patient has had chronic neoplasm related pain and severe anxiety/depression.  He was referred to palliative care clinic to assist with symptom management and to establish treatment goals.   Observations/Objective: Patient reports that he has good days and bad.  Overall, moods appear to be more stable.  I did refill his Prozac today.  He reports having some recent conflict with his brother in [place], which exacerbated his depression/anxiety.  Patient reports his oxycodone seems somewhat less effective than it used to be.  However, he wants to maintain the current regimen for now.  He does endorse having worsening lower back pain at the site of a previous bulging disc for which he received a steroid injection.  Could consider reimaging if this pain worsens versus referral back to Ortho.  Patient had some questions regarding his treatment regimen now that Dr. Mike Gip is no longer his attending.  I suggested the probable course would be to maintain the current treatment regimen until he establishes with a new oncologist.  Assessment and Plan: Stage IV lung cancer -on treatment with immunotherapy.   Neoplasm related pain -continue oxycodone 20 to 30 mg every 4 hours as needed for breakthrough  pain  Anxiety -continue alprazolam as needed  Depression -continue Prozac/olanzapine  Constipation -continue daily senna and MiraLAX  Follow Up Instructions: Follow-up MyChart visit 1-2 months   I discussed the assessment and treatment plan with the patient. The patient was provided an opportunity to ask questions and all were answered. The patient agreed with the plan and demonstrated an understanding of the instructions.   The patient was advised to call back or seek an in-person evaluation if the symptoms worsen or if the condition fails to improve as anticipated.  I provided 10 minutes of non-face-to-face time during this encounter.   Irean Hong, NP

## 2020-11-27 ENCOUNTER — Inpatient Hospital Stay: Payer: Medicare HMO

## 2020-11-27 ENCOUNTER — Inpatient Hospital Stay (HOSPITAL_BASED_OUTPATIENT_CLINIC_OR_DEPARTMENT_OTHER): Payer: Medicare HMO | Admitting: Nurse Practitioner

## 2020-11-27 ENCOUNTER — Other Ambulatory Visit: Payer: Self-pay

## 2020-11-27 VITALS — BP 105/71 | HR 72 | Temp 96.8°F | Resp 18 | Wt 134.5 lb

## 2020-11-27 DIAGNOSIS — F32A Depression, unspecified: Secondary | ICD-10-CM | POA: Diagnosis not present

## 2020-11-27 DIAGNOSIS — Z5112 Encounter for antineoplastic immunotherapy: Secondary | ICD-10-CM | POA: Diagnosis not present

## 2020-11-27 DIAGNOSIS — T451X5A Adverse effect of antineoplastic and immunosuppressive drugs, initial encounter: Secondary | ICD-10-CM | POA: Diagnosis not present

## 2020-11-27 DIAGNOSIS — I2699 Other pulmonary embolism without acute cor pulmonale: Secondary | ICD-10-CM

## 2020-11-27 DIAGNOSIS — D6481 Anemia due to antineoplastic chemotherapy: Secondary | ICD-10-CM | POA: Diagnosis not present

## 2020-11-27 DIAGNOSIS — Z5111 Encounter for antineoplastic chemotherapy: Secondary | ICD-10-CM

## 2020-11-27 DIAGNOSIS — R69 Illness, unspecified: Secondary | ICD-10-CM | POA: Diagnosis not present

## 2020-11-27 DIAGNOSIS — C3431 Malignant neoplasm of lower lobe, right bronchus or lung: Secondary | ICD-10-CM

## 2020-11-27 DIAGNOSIS — C7951 Secondary malignant neoplasm of bone: Secondary | ICD-10-CM

## 2020-11-27 DIAGNOSIS — G893 Neoplasm related pain (acute) (chronic): Secondary | ICD-10-CM

## 2020-11-27 DIAGNOSIS — Z7901 Long term (current) use of anticoagulants: Secondary | ICD-10-CM

## 2020-11-27 DIAGNOSIS — K5900 Constipation, unspecified: Secondary | ICD-10-CM | POA: Diagnosis not present

## 2020-11-27 DIAGNOSIS — F419 Anxiety disorder, unspecified: Secondary | ICD-10-CM | POA: Diagnosis not present

## 2020-11-27 LAB — CBC WITH DIFFERENTIAL/PLATELET
Abs Immature Granulocytes: 0.27 K/uL — ABNORMAL HIGH (ref 0.00–0.07)
Basophils Absolute: 0.1 K/uL (ref 0.0–0.1)
Basophils Relative: 0 %
Eosinophils Absolute: 0.1 K/uL (ref 0.0–0.5)
Eosinophils Relative: 1 %
HCT: 26.9 % — ABNORMAL LOW (ref 39.0–52.0)
Hemoglobin: 8.1 g/dL — ABNORMAL LOW (ref 13.0–17.0)
Immature Granulocytes: 2 %
Lymphocytes Relative: 8 %
Lymphs Abs: 1.3 K/uL (ref 0.7–4.0)
MCH: 28.1 pg (ref 26.0–34.0)
MCHC: 30.1 g/dL (ref 30.0–36.0)
MCV: 93.4 fL (ref 80.0–100.0)
Monocytes Absolute: 1.1 K/uL — ABNORMAL HIGH (ref 0.1–1.0)
Monocytes Relative: 8 %
Neutro Abs: 12 K/uL — ABNORMAL HIGH (ref 1.7–7.7)
Neutrophils Relative %: 81 %
Platelets: 492 K/uL — ABNORMAL HIGH (ref 150–400)
RBC: 2.88 MIL/uL — ABNORMAL LOW (ref 4.22–5.81)
RDW: 21.8 % — ABNORMAL HIGH (ref 11.5–15.5)
WBC: 14.9 K/uL — ABNORMAL HIGH (ref 4.0–10.5)
nRBC: 0 % (ref 0.0–0.2)

## 2020-11-27 LAB — COMPREHENSIVE METABOLIC PANEL
ALT: 10 U/L (ref 0–44)
AST: 19 U/L (ref 15–41)
Albumin: 3 g/dL — ABNORMAL LOW (ref 3.5–5.0)
Alkaline Phosphatase: 91 U/L (ref 38–126)
Anion gap: 9 (ref 5–15)
BUN: 13 mg/dL (ref 8–23)
CO2: 25 mmol/L (ref 22–32)
Calcium: 9.4 mg/dL (ref 8.9–10.3)
Chloride: 104 mmol/L (ref 98–111)
Creatinine, Ser: 1.27 mg/dL — ABNORMAL HIGH (ref 0.61–1.24)
GFR, Estimated: >60 mL/min (ref >60)
Glucose, Bld: 124 mg/dL — ABNORMAL HIGH (ref 70–99)
Potassium: 4 mmol/L (ref 3.5–5.1)
Sodium: 138 mmol/L (ref 135–145)
Total Bilirubin: 0.6 mg/dL (ref 0.3–1.2)
Total Protein: 6.8 g/dL (ref 6.5–8.1)

## 2020-11-27 LAB — MAGNESIUM: Magnesium: 1.9 mg/dL (ref 1.7–2.4)

## 2020-11-27 MED ORDER — SODIUM CHLORIDE 0.9 % IV SOLN
700.0000 mg | Freq: Once | INTRAVENOUS | Status: AC
  Administered 2020-11-27: 700 mg via INTRAVENOUS
  Filled 2020-11-27: qty 20

## 2020-11-27 MED ORDER — SODIUM CHLORIDE 0.9 % IV SOLN
Freq: Once | INTRAVENOUS | Status: AC
  Filled 2020-11-27: qty 250

## 2020-11-27 MED ORDER — SODIUM CHLORIDE 0.9 % IV SOLN
200.0000 mg | Freq: Once | INTRAVENOUS | Status: AC
  Administered 2020-11-27: 200 mg via INTRAVENOUS
  Filled 2020-11-27: qty 8

## 2020-11-27 MED ORDER — HEPARIN SOD (PORK) LOCK FLUSH 100 UNIT/ML IV SOLN
500.0000 [IU] | Freq: Once | INTRAVENOUS | Status: DC | PRN
  Filled 2020-11-27: qty 5

## 2020-11-27 MED ORDER — HEPARIN SOD (PORK) LOCK FLUSH 100 UNIT/ML IV SOLN
INTRAVENOUS | Status: AC
  Filled 2020-11-27: qty 5

## 2020-11-27 MED ORDER — CYANOCOBALAMIN 1000 MCG/ML IJ SOLN
1000.0000 ug | Freq: Once | INTRAMUSCULAR | Status: DC
  Filled 2020-11-27: qty 1

## 2020-11-27 NOTE — Patient Instructions (Signed)
[facility]  Discharge Instructions: Thank you for choosing [facility] to provide your oncology and hematology care.  If you have a lab appointment with the Cancer Center, please go directly to the Cancer Center and check in at the registration area.  Wear comfortable clothing and clothing appropriate for easy access to any Portacath or PICC line.   We strive to give you quality time with your provider. You may need to reschedule your appointment if you arrive late (15 or more minutes).  Arriving late affects you and other patients whose appointments are after yours.  Also, if you miss three or more appointments without notifying the office, you may be dismissed from the clinic at the provider's discretion.      For prescription refill requests, have your pharmacy contact our office and allow 72 hours for refills to be completed.    Today you received the following chemotherapy and/or immunotherapy agents       To help prevent nausea and vomiting after your treatment, we encourage you to take your nausea medication as directed.  BELOW ARE SYMPTOMS THAT SHOULD BE REPORTED IMMEDIATELY: *FEVER GREATER THAN 100.4 F (38 C) OR HIGHER *CHILLS OR SWEATING *NAUSEA AND VOMITING THAT IS NOT CONTROLLED WITH YOUR NAUSEA MEDICATION *UNUSUAL SHORTNESS OF BREATH *UNUSUAL BRUISING OR BLEEDING *URINARY PROBLEMS (pain or burning when urinating, or frequent urination) *BOWEL PROBLEMS (unusual diarrhea, constipation, pain near the anus) TENDERNESS IN MOUTH AND THROAT WITH OR WITHOUT PRESENCE OF ULCERS (sore throat, sores in mouth, or a toothache) UNUSUAL RASH, SWELLING OR PAIN  UNUSUAL VAGINAL DISCHARGE OR ITCHING   Items with * indicate a potential emergency and should be followed up as soon as possible or go to the Emergency Department if any problems should occur.  Please show the CHEMOTHERAPY ALERT CARD or IMMUNOTHERAPY ALERT CARD at check-in to the Emergency  Department and triage nurse.  Should you have questions after your visit or need to cancel or reschedule your appointment, please contact [facility] [place] [facility]  [phone] and follow the prompts.  Office hours are 8:00 a.m. to 4:30 p.m. Monday - Friday. Please note that voicemails left after 4:00 p.m. may not be returned until the following business day.  We are closed weekends and major holidays. You have access to a nurse at all times for urgent questions. Please call the main number to the clinic [phone] and follow the prompts.  For any non-urgent questions, you may also contact your provider using MyChart. We now offer e-Visits for anyone 18 and older to request care online for non-urgent symptoms. For details visit [email or web address].   Also download the MyChart app! Go to the app store, search "MyChart", open the app, select [facility], and log in with your MyChart username and password.  Due to Covid, a mask is required upon entering the hospital/clinic. If you do not have a mask, one will be given to you upon arrival. For doctor visits, patients may have 1 support person aged 18 or older with them. For treatment visits, patients cannot have anyone with them due to current Covid guidelines and our immunocompromised population.  

## 2020-11-27 NOTE — Progress Notes (Signed)
[facility]  [address], [address] [place], [place] [address] Phone: [phone]  Fax: [phone]  Clinic Day: 11/27/20  Referring physician: [name], NP  Chief Complaint: Metastatic Lung Cancer- On Keytruda+Alimta maintenance who returns to re-evaluation and consideration of treatment.   Oncology History: Jimmy West is a 70 y.o. 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  Pathology revealed an invasive high-grade adenocarcinoma, with predominantly solid growth pattern.  The neoplastic cells were TTF-1 (+), Napsin A (+), and P40 (+).  He has a T4 vertebral metastasis.  Clinical stage is T4N1M1.   There was not enough material for Foundation One testing.  PDL-1 revealed TPS 90%.   PET scan on 04/30/2018 revealed a 3.4 cm hypermetabolic RLL pulmonary mass (SUV 13.2), 11 mm pulmonary nodule in the LEFT lung (SUV 4.5), RIGHT paratracheal lymph node (SUV 5.1), and hypermetabolic activity within the T4 vertebral body (SUV 10.2).    He received 11 cycles of pembrolizumab (05/24/2018 - 02/07/2019).  He tolerated treatment well.  CEA was 1.3 on 08/30/2018.  LDH was 165 on 11/29/2018.   He completed T4 radiation on 07/07/2018.  He receives Xgeva monthly (06/03/2018 - 03/12/2020).   He received 1 cycle of carboplatin, Alimta, and pembrolizumab on 02/08/2019.  Cycle #1 was complicated by nausea, vomiting, and diarrhea necessitating hospitalization.  Decision made to hold carboplatin with his second dose.  He is s/p 2 cycles of  Alimta and pembrolizumab (02/28/2019 - 03/29/2019).  Cycle #2 was complicated by nausea, vomiting, and dehydration requring fluids in clinic and an overnight stay in the hospital.  He received carboplatin (AUC 2), Alimta, and pebrolizumab (04/18/2019).  He tolerated it poorly.     He has received 23 cycles of Alimta and pembrolizumab on (05/09/2019 -  04/11/2020; 07/11/2020- 10/16/2020). He began Fulphila with cycle #14 secondary to neutropenia.  He has tolerated chemotherapy well.  He receives B12 every 9 weeks (last 10/03/2020).   Chest CT angiogram on 03/21/2020 revealed no evidence of pulmonary embolism.  A rounded spiculated 3.4 x 3 cm mass in the RUL was slightly enlarged with adjacent pleural thickening.  There was a stable 1.4 x 0.6 cm irregular subpleural density in the RUL.  There was stable severe compression deformity in the upper thoracic vertebral body consistent with an old fracture.  CT guided right lower lobe lung biopsy on 05/09/2020 revealed non-small cell carcinoma in a background of significant necrosis. Immunohistochemical studies showed strong staining for CK7, both in viable tumor cells and necrotic debris. There was patchy marking for p40 and p63. TTF-1 and Napsin-A were negative.  This may represent potential transformation to a carcinoma with squamous differentiation or the original biopsy could have represented the glandular component of an adenosquamous carcinoma.  Foundation One on 05/09/2020 revealed no reportable alterations.  Microsatellite status was stable, tumor mutational burden 5 Muts/MB, CDKN2A loss, CDKN2B loss, KRAS G12D, MTAP loss, NF2 E442*, STK11 P203 fs*84 and TP53 Y205S.  There were no reportable alterations in ALK, BRAF, EGFR, ERBB2, MET, RET, and ROS1.  Head CT on 06/26/2020 revealed no evidence of intracranial metastasis.  Chest, abdomen, and pelvis CT on 06/26/2020 revealed a 3.1 x 2.4 cm (previously 3.4 x 3.0 cm) spiculated mass in the superior segment of the RLL.  There was a stable 1.4 x 0.7 cm spiculated subpleural nodule in the posterolateral RUL.  There was a stable high grade vertebral plana deformity  of the T4 vertebral body.  There was no evidence of metastatic disease in the abdomen or pelvis.  There was mild stable splenomegaly (13.6 cm).  Bone scan on 06/26/2020 revealed no evidence of  metastatic disease.  Chest, abdomen, and pelvis CT on 11/01/2020 revealed an enlarging superior segment RLL pulmonary mass (2.7 x 2.5 cm to 3.3 x 3.3 cm). There was slight interval enlargement of the RUL subpleural lesion (11 mm to 14 mm) and a new 13 mm sub solid nodular density in the right upper lobe. Recommended attention on future scans. There was no mediastinal or hilar mass or adenopathy. There were advanced emphysematous changes with associated peripheral interstitial lung disease and patchy areas of bronchiectasis. 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 There were no findings for abdominal/pelvic metastatic disease. There was stable mild splenomegaly. There were stable advanced atherosclerotic calcifications involving the thoracic and abdominal aorta and branch vessels including the coronary arteries. There was emphysema and aortic atherosclerosis.  He has cancer-related pain in T4.  He is off the Fentanyl patch.  He is taking oxycodone 10-20 mg every 4 hours prn.  He has cervicalgia.  Cervical spine MRI on 08/14/2019 showed no evidence of metastatic disease within the cervical spine.  He is followed by Dr Izora Ribas.   He has chemotherapy induced anemia.  He received Retacrit on 11/01/2019 (last 09/25/2020).   He had transient renal insufficiency on 03/23/2019.  Creatinine was 1.32 (baseline 0.61-1.0).  Renal ultrasound on 03/24/2019 revealed no acute abnormality identified. There was no hydronephrosis or bladder distention.  Creatinine is 0.82 today.  Stool was positive for C difficile + diarrhea on 06/14/2019.  He was treated with oral vancomycin.   He received the high dose influenza vaccine on 06/11/2020  HPI: Patient is 70 year old male with history of metastatic lung cancer, non small cell, currently on maintenance alimta-keytruda who returns to clinic for follow up. Continues to feel moderately well. Last imaging in April. RUL mass had increased and RUL subpleural lesion  had increased. New 13 mm density in RUL. Eventual need to switch to likely taxotere given squamous component of disease was discussed. Patient however was asymptomatic, feeling great, and elected to continue alimta-pembro. Denies any neurologic complaints. Denies recent fevers or illnesses. Denies any easy bleeding or bruising. No melena or hematochezia. No pica or restless leg. Reports good appetite and denies weight loss. Denies chest pain. Denies any nausea, vomiting, constipation, or diarrhea. Denies urinary complaints. Patient offers no further specific complaints today.   Past Medical History:  Diagnosis Date   Bulging lumbar disc    Cancer ([place])    stage 4 lung cancer   Heart attack [facility])     Past Surgical History:  Procedure Laterality Date   CARDIAC CATHETERIZATION     two stents   KNEE SURGERY Left    PORTA CATH INSERTION N/A 05/21/2018   Procedure: PORTA CATH INSERTION;  Surgeon: Algernon Huxley, MD;  Location: [place] [facility];  Service: Cardiovascular;  Laterality: N/A;    Family History  Problem Relation Age of Onset   Heart failure Father    Cancer Maternal Aunt    Heart failure Maternal Uncle    Dementia Paternal Grandmother    Cancer Maternal Aunt     Social History:  reports that he quit smoking about 17 years ago. His smoking use included cigarettes. He has a 22.50 pack-year smoking history. He has never used smokeless tobacco. He reports previous alcohol use. He  reports that he does not use drugs. He started smoking at age 31.  He is smoking 1 1/2 packs/day. Patient denies known exposures to radiation or toxins. 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  He has not been working recently as the salon was closed.  He plays golf occasionally (none recently). He has 8 cats. The patient is alone today.   Allergies: No Known Allergies  Current Medications: Current Outpatient Medications  Medication Sig Dispense Refill   albuterol (VENTOLIN  HFA) 108 (90 Base) MCG/ACT inhaler Inhale 2 puffs into the lungs every 6 (six) hours as needed for wheezing or shortness of breath. 18 g 3   ALPRAZolam (XANAX) 0.5 MG tablet Take 1 tablet (0.5 mg total) by mouth 4 (four) times daily as needed for anxiety. 60 tablet 0   apixaban (ELIQUIS) 5 MG TABS tablet Take 1 tablet (5 mg total) by mouth 2 (two) times daily. 180 tablet 3   Calcium 600-400 MG-UNIT CHEW Chew 2 tablets by mouth daily.     dexamethasone (DECADRON) 4 MG tablet Take 1 tab two times a day the day before Alimta chemo, then take 2 tabs once a day for 3 days starting the day after chemo. 30 tablet 1   FLUoxetine (PROZAC) 40 MG capsule Take 1 capsule (40 mg total) by mouth daily. 90 capsule 1   folic acid (FOLVITE) 1 MG tablet Take 1 tablet (1 mg total) by mouth daily. Continue until 21 days after Alimta completed. 30 tablet 3   OLANZapine (ZYPREXA) 5 MG tablet Take 1 tablet (5 mg total) by mouth at bedtime. 30 tablet 3   Oxycodone HCl 20 MG TABS Take 1-1.5 tablets (20-30 mg total) by mouth every 4 (four) hours as needed. 90 tablet 0   polyethylene glycol (MIRALAX / GLYCOLAX) packet Take 17 g by mouth daily.     prochlorperazine (COMPAZINE) 10 MG tablet Take 1 tablet (10 mg total) by mouth every 6 (six) hours as needed for nausea or vomiting. 30 tablet 0   senna (SENOKOT) 8.6 MG tablet Take 1 tablet by mouth as needed.      No current facility-administered medications for this visit.   Facility-Administered Medications Ordered in Other Visits  Medication Dose Route Frequency Provider Last Rate Last Admin   0.9 %  sodium chloride infusion   Intravenous Once Corcoran, Melissa C, MD       0.9 %  sodium chloride infusion   Intravenous Once PRN Corcoran, Melissa C, MD       albuterol (PROVENTIL) (2.5 MG/3ML) 0.083% nebulizer solution 2.5 mg  2.5 mg Nebulization Once PRN Corcoran, Melissa C, MD       alteplase (CATHFLO ACTIVASE) injection 2 mg  2 mg Intracatheter Once PRN Lequita Asal,  MD       EPINEPHrine (ADRENALIN) 1 MG/10ML injection 0.25 mg  0.25 mg Intravenous Once PRN Corcoran, Melissa C, MD       EPINEPHrine (ADRENALIN) 1 MG/10ML injection 0.25 mg  0.25 mg Intravenous Once PRN Corcoran, Melissa C, MD       heparin lock flush 100 unit/mL  500 Units Intracatheter Once PRN Nolon Stalls C, MD       heparin lock flush 100 unit/mL  250 Units Intracatheter Once PRN Nolon Stalls C, MD       sodium chloride flush (NS) 0.9 % injection 10 mL  10 mL Intravenous PRN Lequita Asal, MD   10 mL at 03/04/19 [phone]  sodium chloride flush (NS) 0.9 % injection 10 mL  10 mL Intracatheter Once PRN Corcoran, Melissa C, MD       sodium chloride flush (NS) 0.9 % injection 3 mL  3 mL Intracatheter Once PRN Lequita Asal, MD        Review of Systems  Constitutional:  Negative for chills, fever, malaise/fatigue and weight loss.  HENT:  Negative for hearing loss, nosebleeds, sore throat and tinnitus.   Eyes:  Negative for blurred vision and double vision.  Respiratory:  Negative for cough, hemoptysis, shortness of breath and wheezing.   Cardiovascular:  Negative for chest pain, palpitations and leg swelling.  Gastrointestinal:  Negative for abdominal pain, blood in stool, constipation, diarrhea, melena, nausea and vomiting.  Genitourinary:  Negative for dysuria and urgency.  Musculoskeletal:  Negative for back pain, falls, joint pain and myalgias.  Skin:  Negative for itching and rash.  Neurological:  Negative for dizziness, tingling, sensory change, loss of consciousness, weakness and headaches.  Endo/Heme/Allergies:  Negative for environmental allergies. Does not bruise/bleed easily.  Psychiatric/Behavioral:  Negative for depression. The patient is not nervous/anxious and does not have insomnia.   Performance status (ECOG): 1  Vitals Blood pressure 105/71, pulse 72, temperature (!) 96.8 F (36 C), temperature source Tympanic, resp. rate 18, weight 134 lb 7.7 oz (61  kg), SpO2 99 %.   General: Well-developed, well-nourished, no acute distress. Eyes: Pink conjunctiva, anicteric sclera. Lungs: Clear to auscultation bilaterally.  No audible wheezing or coughing Heart: Regular rate and rhythm.  Abdomen: Soft, nontender, nondistended.  Musculoskeletal: No edema, cyanosis, or clubbing. Neuro: Alert, answering all questions appropriately. Cranial nerves grossly intact. Skin: No rashes or petechiae noted. Psych: Normal affect.   Imaging studies: 04/30/2018:  PET scan revealed a 3.4 cm hypermetabolic RLL pulmonary mass (SUV 13.2), 11 mm pulmonary nodule in the LEFT lung (SUV 4.5), RIGHT paratracheal lymph node (SUV 5.1), and hypermetabolic activity within the T4 vertebral body (SUV 10.2).  05/03/2018:  Thoracic spine MRI on 05/03/2018 revealed T4 metastasis with a 40% pathologic compression deformity and 5 mm of retropulsion of the vertebral body.  Retropulsion results in mild spinal canal stenosis and mild bilateral C4-5 foraminal stenosis.  There was paravertebral soft tissue thickening from mid T3 to mid T5, likely representing edema related to the pathologic compression deformity vs. possible extraosseous extension of the neoplasm.  There were no additional thoracic spinal metastases noted.  05/03/2018:  Head MRI revealed no intracranial metastatic disease.  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  Incidental mention made of mild paranasal sinus disease. 01/10/2019:  Cervical and thoracic spine CT at [facility] revealed unchanged severe compression deformity/vertebral plana of T4 with a stable degree of retropulsion and focal mild spinal canal stenosis.  There was interval enlargement of a right lower lobe pulmonary mass (4.5 x 3.5 cm compared to 3.9 x 2.9 cm) with redemonstrated spiculated pulmonary  nodules. 01/28/2019:  Chest, abdomen, pelvis CT revealed slight interval enlargement of a right lower lobe mass with central necrosis measuring 4.6 x 3.4 cm, previously 4.0 x 3.0 cm. There was no change in right upper lobe nodules (1.4 and 0.8 cm). There was almost no residua of left lung nodules, with irregular opacities in the apical left upper lobe and superior segment left lower lobe. There was no change in right hilar soft tissue  and lymph nodes. There was vertebra plana deformity of T4 and no evidence of new osseous metastatic disease. There was no evidence of distant metastatic disease in the abdomen or pelvis. The 1.6 x 1.1 cm left adrenal nodule (non-metabolic on prior PET scan), was unchanged. 03/24/2019:  Renal ultrasound revealed no acute abnormality identified. There was no hydronephrosis or bladder distention.   04/23/2019:  Abdomen and pelvis CT revealed no acute abdominal or pelvic pathology.  There was fluid in the colon which can be seen with diarrhea.  There was diverticulosis without evidence of diverticulitis.  There was a stable 1.5 cm left adrenal nodule. 05/06/2019:  Chest, abdomen, and pelvis CT revealed a positive response to interval therapy for the known right lower lobe lung carcinoma (4.6 x 3.4 cm to 3.1 x 2.8 cm transversely).  There had been a mild interval decrease contiguous soft tissue that extends along the right hilum. The two right upper lobe nodules were stable from the most recent prior study (smaller than 04/27/2018). There are no new lung nodules. There was no evidence of new metastatic disease.  There was stable severe compression deformity/vertebra plana of T4. There was no evidence of other osseous metastatic disease.  There was a stable left adrenal nodule consistent with an adenoma. 05/30/2019:  Chest CT angiogram revealed a tiny subsegmental pulmonary embolus in the right lower lobe.  There areas of ground-glass at the periphery of the right chest had become more  conspicuous. This may relate to mild pneumonitis, perhaps due to radiation, attention on follow-up. The dominant nodule in the right lung base was stable in size.  08/14/2019:  Cervical spine MRI revealed no evidence of metastatic disease within the cervical spine. 09/16/2019:  Chest, abdomen and pelvis CT revealed a similar-appearing spiculated right lower lobe nodule. There was no evidence for metastatic disease in the abdomen or pelvis. There was stable left adrenal adenoma. 12/15/2019:  Chest CT revealed an enlarging right lower lobe mass c/w malignancy (3.0 x 2.4 cm to 3.3 x 2.7 cm).  There was slight enlargement in a spiculated right upper lobe subpleural nodule (0.8 x 1.5 cm to 1.2 x 0.7 cm). 01/16/2020:  Bone scan revealed no scintigraphic evidence of osseous metastatic disease. 03/21/2020:  Chest CT angiogram revealed no evidence of pulmonary embolism.  A rounded spiculated mass in the RUL measuring 3.4 x 3 cm which was slightly enlarged with adjacent pleural thickening.  There was a stable 1.4 x 0.6 cm irregular subpleural density in the RUL.  There was stable severe compression deformity in the upper thoracic vertebral body consistent with an old fracture. 06/26/2020:  Head CT with or without contrast revealed no evidence of intracranial metastasis. 06/26/2020:  Chest, abdomen, and pelvis CT with contrast revealed a 3.1 x 2.4 cm (previously 3.4 x 3.0 cm) spiculated mass in the superior segment of the RLL.  There was a stable 1.4 x 0.7 cm spiculated subpleural nodule in the posterolateral RUL.  There was a stable high grade vertebral plana deformity of the T4 vertebral body.  There was no evidence of metastatic disease in the abdomen or pelvis.  There was mild stable splenomegaly (13.6 cm). 06/26/2020:  Bone scan revealed no evidence of metastatic disease. 07/05/2020:  PET scan revealed a 3.1 x 2.5 cm mass within superior segment of the RLL with intense FDG uptake (SUV 7.16) c/w metabolically  active tumor. Additionally, there were 2 small nodules within the posterior RUL which were FDG avid (1 cm ; SUV 3.84) concerning  for additional sites of disease. There was a geographic, curvilinear bandlike area of increased uptake within the posterior left lung base that corresponded to an area of airspace and ground-glass attenuation. The geographic distribution of this finding favored set inflammatory or infectious process. 11/01/2020:  Chest, abdomen, and pelvis CT revealed an enlarging superior segment RLL pulmonary mass (2.7 x 2.5 cm to 3.3 x 3.3 cm). There was slight interval enlargement of the RUL subpleural lesion (11 mm to 14 mm) and a new 13 mm sub solid nodular density in the right upper lobe. Recommended attention on future scans. There was no mediastinal or hilar mass or adenopathy. There were advanced emphysematous changes with associated peripheral interstitial lung disease and patchy areas of bronchiectasis. 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 There were no findings for abdominal/pelvic metastatic disease. There was stable mild splenomegaly. There were stable advanced atherosclerotic calcifications involving the thoracic and abdominal aorta and branch vessels including the coronary arteries. There was emphysema and aortic atherosclerosis.   Infusion on 11/27/2020  Component Date Value Ref Range Status   Magnesium 11/27/2020 1.9  1.7 - 2.4 mg/dL Final   Performed at [facility], [address] [place], [place] [address]   Sodium 11/27/2020 138  135 - 145 mmol/L Final   Potassium 11/27/2020 4.0  3.5 - 5.1 mmol/L Final   Chloride 11/27/2020 104  98 - 111 mmol/L Final   CO2 11/27/2020 25  22 - 32 mmol/L Final   Glucose, Bld 11/27/2020 124* 70 - 99 mg/dL Final   Glucose reference range applies only to samples taken after fasting for at least 8 hours.   BUN 11/27/2020 13  8 - 23 mg/dL Final   Creatinine, Ser 11/27/2020 1.27* 0.61 - 1.24 mg/dL Final   Calcium  11/27/2020 9.4  8.9 - 10.3 mg/dL Final   Total Protein 11/27/2020 6.8  6.5 - 8.1 g/dL Final   Albumin 11/27/2020 3.0* 3.5 - 5.0 g/dL Final   AST 11/27/2020 19  15 - 41 U/L Final   ALT 11/27/2020 10  0 - 44 U/L Final   Alkaline Phosphatase 11/27/2020 91  38 - 126 U/L Final   Total Bilirubin 11/27/2020 0.6  0.3 - 1.2 mg/dL Final   GFR, Estimated 11/27/2020 >60  >60 mL/min Final   Comment: (NOTE) Calculated using the CKD-EPI Creatinine Equation (2021)    Anion gap 11/27/2020 9  5 - 15 Final   Performed at [facility], [address] [place], [place] [address]   WBC 11/27/2020 14.9* 4.0 - 10.5 K/uL Final   RBC 11/27/2020 2.88* 4.22 - 5.81 MIL/uL Final   Hemoglobin 11/27/2020 8.1* 13.0 - 17.0 g/dL Final   HCT 11/27/2020 26.9* 39.0 - 52.0 % Final   MCV 11/27/2020 93.4  80.0 - 100.0 fL Final   MCH 11/27/2020 28.1  26.0 - 34.0 pg Final   MCHC 11/27/2020 30.1  30.0 - 36.0 g/dL Final   RDW 11/27/2020 21.8* 11.5 - 15.5 % Final   Platelets 11/27/2020 492* 150 - 400 K/uL Final   nRBC 11/27/2020 0.0  0.0 - 0.2 % Final   Neutrophils Relative % 11/27/2020 81  % Final   Neutro Abs 11/27/2020 12.0* 1.7 - 7.7 K/uL Final   Lymphocytes Relative 11/27/2020 8  % Final   Lymphs Abs 11/27/2020 1.3  0.7 - 4.0 K/uL Final   Monocytes Relative 11/27/2020 8  % Final   Monocytes Absolute 11/27/2020 1.1* 0.1 - 1.0 K/uL Final  Eosinophils Relative 11/27/2020 1  % Final   Eosinophils Absolute 11/27/2020 0.1  0.0 - 0.5 K/uL Final   Basophils Relative 11/27/2020 0  % Final   Basophils Absolute 11/27/2020 0.1  0.0 - 0.1 K/uL Final   Immature Granulocytes 11/27/2020 2  % Final   Abs Immature Granulocytes 11/27/2020 0.27* 0.00 - 0.07 K/uL Final   Performed at [facility], [address], [place], [place] [address]    Assessment:   Jimmy West is a 70 y.o. male with metastatic adenocarcinoma with potential transformation to a carcinoma with squamous differentiation or  adenosquamous carcinoma of the lung who is seen for evaluation and consideration of pembro-alimta.   1.   Metastatic high-grade adenocarcinoma the RIGHT lung- slight interval enlargement of RUL subpleural lesion and new sub solid nodular density in the RUL. Tolerating Key-truda alimta well. Patient previously elected to continue treatment as opposed to changing. Labs today reviewed and acceptable for treatment. Clinically stable. Will plan for patient to follow up with MD/Medical Oncologist for next visit to discuss next steps/options.   2. Renal insufficiency- Monitor closely. Cr 1.27, BUN 13.   3. Bone metastasis- Last Xgeva 03/12/20. Bone scan and pet in 2021 revealed no evidence of metastatic disease. Reinstitute xgeva if bone progression noted clinically or radiographically.   4. Chemotherapy induced anemia- given advanced stage disease, patient was felt to get possible benefit from ESA. Hemoglobin 8.1 today. Downtrending. RTC tomorrow for retacrit  5. Pulmonary elbolism- continue eliquis.   6. Anxiety and depression- on prozac with xanax for breakthrough. Improved. Monitor.   7. Hypercalcemia- s/p zometa on 11/13/20. no indication for zometa today. Hold oral calcium supplements & tums. Monitor.   8. B12 Deficiency- b12 monthly. Next on 11/29/20.   Plan: Treatment today Retacrit and B12 tomorrow. RTC in 3 weeks for labs (cbc, cmp, mg), MD and alimta-keytruda  I discussed the assessment and treatment plan with the patient.  The patient was provided an opportunity to ask questions and all were answered.  The patient agreed with the plan and demonstrated an understanding of the instructions.  The patient was advised to call back if the symptoms worsen or if the condition fails to improve as anticipated.   Beckey Rutter, DNP, AGNP-C

## 2020-11-29 ENCOUNTER — Inpatient Hospital Stay: Payer: Medicare HMO

## 2020-11-29 ENCOUNTER — Other Ambulatory Visit: Payer: Self-pay

## 2020-11-29 ENCOUNTER — Other Ambulatory Visit: Payer: Self-pay | Admitting: *Deleted

## 2020-11-29 VITALS — BP 121/77 | HR 80

## 2020-11-29 DIAGNOSIS — C3431 Malignant neoplasm of lower lobe, right bronchus or lung: Secondary | ICD-10-CM | POA: Diagnosis not present

## 2020-11-29 DIAGNOSIS — Z5112 Encounter for antineoplastic immunotherapy: Secondary | ICD-10-CM | POA: Diagnosis not present

## 2020-11-29 DIAGNOSIS — R69 Illness, unspecified: Secondary | ICD-10-CM | POA: Diagnosis not present

## 2020-11-29 DIAGNOSIS — K5900 Constipation, unspecified: Secondary | ICD-10-CM | POA: Diagnosis not present

## 2020-11-29 DIAGNOSIS — G893 Neoplasm related pain (acute) (chronic): Secondary | ICD-10-CM | POA: Diagnosis not present

## 2020-11-29 DIAGNOSIS — C7951 Secondary malignant neoplasm of bone: Secondary | ICD-10-CM

## 2020-11-29 MED ORDER — EPOETIN ALFA-EPBX 10000 UNIT/ML IJ SOLN
10000.0000 [IU] | Freq: Once | INTRAMUSCULAR | Status: AC
  Administered 2020-11-29: 10000 [IU] via SUBCUTANEOUS
  Filled 2020-11-29: qty 1

## 2020-11-29 MED ORDER — CYANOCOBALAMIN 1000 MCG/ML IJ SOLN
1000.0000 ug | Freq: Once | INTRAMUSCULAR | Status: AC
  Administered 2020-11-29: 1000 ug via INTRAMUSCULAR
  Filled 2020-11-29: qty 1

## 2020-11-29 MED ORDER — OLANZAPINE 5 MG PO TABS: 5.0000 mg | ORAL_TABLET | Freq: Every day | ORAL | 3 refills | Status: DC

## 2020-12-03 ENCOUNTER — Other Ambulatory Visit: Payer: Self-pay | Admitting: *Deleted

## 2020-12-03 DIAGNOSIS — F419 Anxiety disorder, unspecified: Secondary | ICD-10-CM

## 2020-12-03 DIAGNOSIS — G893 Neoplasm related pain (acute) (chronic): Secondary | ICD-10-CM

## 2020-12-03 MED ORDER — OXYCODONE HCL 20 MG PO TABS: 20.0000 mg | ORAL_TABLET | ORAL | 0 refills | Status: DC | PRN

## 2020-12-03 MED ORDER — ALPRAZOLAM 0.5 MG PO TABS: 0.5000 mg | ORAL_TABLET | Freq: Four times a day (QID) | ORAL | 0 refills | Status: DC | PRN

## 2020-12-03 MED ORDER — FOLIC ACID 1 MG PO TABS: ORAL_TABLET | ORAL | 3 refills | Status: AC

## 2020-12-03 NOTE — Telephone Encounter (Signed)
Pt called in to request refills of folic acid, xanax, and oxycodone. States has had to take more oxycodone lately than usual. Takes about 5-6 tablets throughout the day to help with pain control.

## 2020-12-03 NOTE — Telephone Encounter (Signed)
Pt left another message asking when to expect his refill for eliquis. Per [facility], pt received last prescription in the mail on 4/8 for 60 day supply. System will auto-refill and pt is expected to receive next prescription on 6/5. Per pt, he is about to run out of his prescription. [facility] is able to send prescription earlier and pt can expect to receive on 5/27.   Pt made aware of the above information and instructed to call back if will run out of medication before 5/27 so we can provide samples to cover him until prescription is received.

## 2020-12-04 ENCOUNTER — Other Ambulatory Visit (HOSPITAL_COMMUNITY): Payer: Self-pay

## 2020-12-18 ENCOUNTER — Other Ambulatory Visit: Payer: Self-pay | Admitting: *Deleted

## 2020-12-18 DIAGNOSIS — C3490 Malignant neoplasm of unspecified part of unspecified bronchus or lung: Secondary | ICD-10-CM

## 2020-12-18 DIAGNOSIS — F419 Anxiety disorder, unspecified: Secondary | ICD-10-CM

## 2020-12-18 DIAGNOSIS — G893 Neoplasm related pain (acute) (chronic): Secondary | ICD-10-CM

## 2020-12-18 MED ORDER — OXYCODONE HCL 20 MG PO TABS: 20.0000 mg | ORAL_TABLET | ORAL | 0 refills | Status: DC | PRN

## 2020-12-18 MED ORDER — ALPRAZOLAM 0.5 MG PO TABS: 0.5000 mg | ORAL_TABLET | Freq: Four times a day (QID) | ORAL | 0 refills | Status: DC | PRN

## 2020-12-21 ENCOUNTER — Other Ambulatory Visit: Payer: Self-pay

## 2020-12-21 ENCOUNTER — Encounter: Payer: Self-pay | Admitting: Internal Medicine

## 2020-12-21 ENCOUNTER — Ambulatory Visit: Payer: Medicare HMO

## 2020-12-21 ENCOUNTER — Inpatient Hospital Stay: Payer: Medicare HMO

## 2020-12-21 ENCOUNTER — Inpatient Hospital Stay: Payer: Medicare HMO | Attending: Internal Medicine | Admitting: Internal Medicine

## 2020-12-21 VITALS — BP 116/69 | HR 76 | Temp 97.0°F | Resp 18 | Wt 137.5 lb

## 2020-12-21 DIAGNOSIS — Z79899 Other long term (current) drug therapy: Secondary | ICD-10-CM | POA: Diagnosis not present

## 2020-12-21 DIAGNOSIS — Z5112 Encounter for antineoplastic immunotherapy: Secondary | ICD-10-CM | POA: Insufficient documentation

## 2020-12-21 DIAGNOSIS — D6481 Anemia due to antineoplastic chemotherapy: Secondary | ICD-10-CM | POA: Insufficient documentation

## 2020-12-21 DIAGNOSIS — C3431 Malignant neoplasm of lower lobe, right bronchus or lung: Secondary | ICD-10-CM

## 2020-12-21 DIAGNOSIS — T451X5A Adverse effect of antineoplastic and immunosuppressive drugs, initial encounter: Secondary | ICD-10-CM | POA: Diagnosis not present

## 2020-12-21 DIAGNOSIS — F419 Anxiety disorder, unspecified: Secondary | ICD-10-CM | POA: Insufficient documentation

## 2020-12-21 DIAGNOSIS — F32A Depression, unspecified: Secondary | ICD-10-CM | POA: Diagnosis not present

## 2020-12-21 DIAGNOSIS — I2699 Other pulmonary embolism without acute cor pulmonale: Secondary | ICD-10-CM | POA: Insufficient documentation

## 2020-12-21 DIAGNOSIS — Z7901 Long term (current) use of anticoagulants: Secondary | ICD-10-CM | POA: Diagnosis not present

## 2020-12-21 DIAGNOSIS — R69 Illness, unspecified: Secondary | ICD-10-CM | POA: Diagnosis not present

## 2020-12-21 DIAGNOSIS — C7951 Secondary malignant neoplasm of bone: Secondary | ICD-10-CM | POA: Insufficient documentation

## 2020-12-21 DIAGNOSIS — C3490 Malignant neoplasm of unspecified part of unspecified bronchus or lung: Secondary | ICD-10-CM

## 2020-12-21 LAB — CBC WITH DIFFERENTIAL/PLATELET
Abs Immature Granulocytes: 0.07 10*3/uL (ref 0.00–0.07)
Basophils Absolute: 0 10*3/uL (ref 0.0–0.1)
Basophils Relative: 0 %
Eosinophils Absolute: 0 10*3/uL (ref 0.0–0.5)
Eosinophils Relative: 0 %
HCT: 28.7 % — ABNORMAL LOW (ref 39.0–52.0)
Hemoglobin: 8.7 g/dL — ABNORMAL LOW (ref 13.0–17.0)
Immature Granulocytes: 1 %
Lymphocytes Relative: 8 %
Lymphs Abs: 0.9 10*3/uL (ref 0.7–4.0)
MCH: 28.6 pg (ref 26.0–34.0)
MCHC: 30.3 g/dL (ref 30.0–36.0)
MCV: 94.4 fL (ref 80.0–100.0)
Monocytes Absolute: 0.9 10*3/uL (ref 0.1–1.0)
Monocytes Relative: 8 %
Neutro Abs: 9.2 10*3/uL — ABNORMAL HIGH (ref 1.7–7.7)
Neutrophils Relative %: 83 %
Platelets: 440 10*3/uL — ABNORMAL HIGH (ref 150–400)
RBC: 3.04 MIL/uL — ABNORMAL LOW (ref 4.22–5.81)
RDW: 19 % — ABNORMAL HIGH (ref 11.5–15.5)
WBC: 11 10*3/uL — ABNORMAL HIGH (ref 4.0–10.5)
nRBC: 0 % (ref 0.0–0.2)

## 2020-12-21 LAB — COMPREHENSIVE METABOLIC PANEL
ALT: 10 U/L (ref 0–44)
AST: 21 U/L (ref 15–41)
Albumin: 3.1 g/dL — ABNORMAL LOW (ref 3.5–5.0)
Alkaline Phosphatase: 89 U/L (ref 38–126)
Anion gap: 8 (ref 5–15)
BUN: 11 mg/dL (ref 8–23)
CO2: 25 mmol/L (ref 22–32)
Calcium: 9 mg/dL (ref 8.9–10.3)
Chloride: 105 mmol/L (ref 98–111)
Creatinine, Ser: 1.1 mg/dL (ref 0.61–1.24)
GFR, Estimated: >60 mL/min (ref >60)
Glucose, Bld: 132 mg/dL — ABNORMAL HIGH (ref 70–99)
Potassium: 3.8 mmol/L (ref 3.5–5.1)
Sodium: 138 mmol/L (ref 135–145)
Total Bilirubin: 0.5 mg/dL (ref 0.3–1.2)
Total Protein: 7 g/dL (ref 6.5–8.1)

## 2020-12-21 LAB — MAGNESIUM: Magnesium: 2 mg/dL (ref 1.7–2.4)

## 2020-12-21 MED ORDER — SODIUM CHLORIDE 0.9 % IV SOLN
700.0000 mg | Freq: Once | INTRAVENOUS | Status: AC
  Administered 2020-12-21: 700 mg via INTRAVENOUS
  Filled 2020-12-21: qty 28
  Filled 2020-12-21: qty 20

## 2020-12-21 MED ORDER — SODIUM CHLORIDE 0.9 % IV SOLN
200.0000 mg | Freq: Once | INTRAVENOUS | Status: AC
  Administered 2020-12-21: 200 mg via INTRAVENOUS
  Filled 2020-12-21 (×2): qty 8

## 2020-12-21 MED ORDER — HEPARIN SOD (PORK) LOCK FLUSH 100 UNIT/ML IV SOLN
500.0000 [IU] | Freq: Once | INTRAVENOUS | Status: AC | PRN
  Administered 2020-12-21: 500 [IU]
  Filled 2020-12-21: qty 5

## 2020-12-21 MED ORDER — CYANOCOBALAMIN 1000 MCG/ML IJ SOLN
1000.0000 ug | Freq: Once | INTRAMUSCULAR | Status: AC
  Administered 2020-12-21: 1000 ug via INTRAMUSCULAR
  Filled 2020-12-21: qty 1

## 2020-12-21 MED ORDER — SODIUM CHLORIDE 0.9 % IV SOLN
Freq: Once | INTRAVENOUS | Status: AC
  Filled 2020-12-21: qty 250

## 2020-12-21 MED ORDER — EPOETIN ALFA-EPBX 10000 UNIT/ML IJ SOLN
10000.0000 [IU] | Freq: Once | INTRAMUSCULAR | Status: AC
Start: 2020-12-21 — End: 2020-12-21
  Administered 2020-12-21: 10000 [IU] via SUBCUTANEOUS
  Filled 2020-12-21: qty 1

## 2020-12-21 MED ORDER — ZOLEDRONIC ACID 4 MG/100ML IV SOLN: 4.0000 mg | Freq: Once | INTRAVENOUS | Status: DC

## 2020-12-21 MED ORDER — ZOLEDRONIC ACID 4 MG/100ML IV SOLN
3.5000 mg | Freq: Once | INTRAVENOUS | Status: AC
Start: 2020-12-21 — End: 2020-12-21
  Administered 2020-12-21: 3.5 mg via INTRAVENOUS
  Filled 2020-12-21: qty 100

## 2020-12-21 NOTE — Progress Notes (Signed)
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   *INTOLERANT to carboplatin at an AUC of 5 as well as 2. # NGS/MOLECULAR TESTS:[employer] One revealed no driver mutations  # PALLIATIVE CARE EVALUATION:  # PAIN MANAGEMENT:    DIAGNOSIS:   STAGE:         ;  GOALS:  CURRENT/MOST RECENT THERAPY :     Primary cancer of right lower lobe of lung ([place])  04/26/2018 Initial Diagnosis   Primary cancer of right lower lobe of lung ([place])   05/24/2018 - 01/10/2019 Chemotherapy   The patient had dexamethasone (DECADRON) 4 MG tablet, 1 of 1 cycle, Start date: 05/17/2018, End date: 08/12/2018 pembrolizumab (KEYTRUDA) 200 mg in sodium chloride 0.9 % 50 mL chemo infusion, 200 mg, Intravenous, Once, 11 of 14 cycles Administration: 200 mg (05/24/2018), 200 mg (07/16/2018), 200 mg (08/09/2018), 200 mg (08/30/2018), 200 mg (09/20/2018), 200 mg (06/14/2018), 200 mg (10/11/2018), 200 mg (11/08/2018), 200 mg (11/29/2018), 200 mg (12/20/2018), 200 mg (01/10/2019)  for chemotherapy treatment.    02/08/2019 - 04/12/2020 Chemotherapy   The patient had dexamethasone (DECADRON) 4 MG tablet, 1 of 1 cycle, Start date: 04/23/2020, End date: -- palonosetron (ALOXI) injection 0.25 mg, 0.25 mg, Intravenous,  Once, 21 of 23 cycles Administration: 0.25 mg (02/08/2019), 0.25 mg (03/29/2019), 0.25 mg (04/18/2019), 0.25 mg (02/28/2019),  0.25 mg (05/09/2019), 0.25 mg (05/30/2019), 0.25 mg (06/27/2019), 0.25 mg (07/18/2019), 0.25 mg (08/08/2019), 0.25 mg (08/30/2019), 0.25 mg (09/20/2019), 0.25 mg (10/11/2019), 0.25 mg (11/08/2019), 0.25 mg (11/29/2019), 0.25 mg (12/21/2019), 0.25 mg (01/18/2020), 0.25 mg (02/08/2020), 0.25 mg (02/29/2020), 0.25 mg (03/22/2020), 0.25 mg (04/11/2020) pegfilgrastim-jmdb (FULPHILA) injection 6 mg, 6 mg, Subcutaneous,  Once, 4 of 6 cycles Administration: 6 mg (03/23/2020), 6 mg (04/12/2020) PEMEtrexed (ALIMTA) 900 mg in sodium chloride 0.9 % 100 mL chemo infusion, 500 mg/m2 = 900 mg, Intravenous,  Once, 21 of 23 cycles Dose modification: 400 mg/m2 (original dose 500 mg/m2, Cycle 14, Reason: Provider Judgment, Comment: change in reanl function, although CrCl > 45 ml/min) Administration: 900 mg (02/08/2019), 900 mg (03/29/2019), 900 mg (04/18/2019), 900 mg (05/09/2019), 900 mg (05/30/2019), 900 mg (02/28/2019), 900 mg (06/27/2019), 900 mg (07/18/2019), 900 mg (08/08/2019), 900 mg (08/30/2019), 900 mg (09/20/2019), 900 mg (10/11/2019), 900 mg (11/08/2019), 700 mg (11/29/2019), 700 mg (01/18/2020), 700 mg (02/08/2020), 700 mg (02/29/2020), 700 mg (12/21/2019), 700 mg (03/22/2020), 700 mg (04/11/2020) CARBOplatin (PARAPLATIN) 500 mg in sodium chloride 0.9 % 250 mL chemo infusion, 470 mg (100 % of original dose 472 mg), Intravenous,  Once, 2 of 2 cycles Dose modification:   (original dose 472 mg, Cycle 1) Administration: 500 mg (02/08/2019), 190 mg (04/18/2019) pembrolizumab (KEYTRUDA) 200 mg in sodium chloride 0.9 % 50 mL chemo infusion, 200 mg, Intravenous, Once, 21 of 23 cycles Administration: 200 mg (02/08/2019), 200 mg (03/29/2019), 200 mg (04/18/2019), 200 mg (05/09/2019), 200 mg (05/30/2019), 200 mg (02/28/2019), 200 mg (06/27/2019), 200 mg (07/18/2019), 200 mg (08/08/2019), 200 mg (08/30/2019), 200 mg (09/20/2019), 200 mg (10/11/2019),  200 mg (11/08/2019), 200 mg (11/29/2019), 200 mg (12/21/2019), 200 mg (01/18/2020), 200 mg (02/08/2020), 200 mg (02/29/2020), 200 mg  (03/22/2020), 200 mg (04/11/2020) fosaprepitant (EMEND) 150 mg, dexamethasone (DECADRON) 12 mg in sodium chloride 0.9 % 145 mL IVPB, , Intravenous,  Once, 4 of 4 cycles Administration:  (02/08/2019),  (04/18/2019)  for chemotherapy treatment.    07/10/2020 -  Chemotherapy    Patient is on Treatment Plan: LUNG MAINTENANCE PEMETREXED + PEMBROLIZUMAB         HISTORY OF PRESENTING ILLNESS:  Jimmy West 70 y.o.  male history of metastatic lung cancer-non-small cell-currently on maintenance Alimta-Keytruda is here for follow-up.   Patient denies any worsening shortness of breath or cough.  Complains of fatigue.  Complains of chronic back pain-currently controlled on oxycodone.  Review of Systems  Constitutional: Positive for malaise/fatigue. Negative for chills, diaphoresis, fever and weight loss.  HENT: Negative for nosebleeds and sore throat.   Eyes: Negative for double vision.  Respiratory: Negative for cough, hemoptysis, sputum production, shortness of breath and wheezing.   Cardiovascular: Negative for chest pain, palpitations, orthopnea and leg swelling.  Gastrointestinal: Negative for abdominal pain, blood in stool, constipation, diarrhea, heartburn, melena, nausea and vomiting.  Genitourinary: Negative for dysuria, frequency and urgency.  Musculoskeletal: Positive for back pain and joint pain.  Skin: Negative.  Negative for itching and rash.  Neurological: Negative for dizziness, tingling, focal weakness, weakness and headaches.  Endo/Heme/Allergies: Does not bruise/bleed easily.  Psychiatric/Behavioral: Negative for depression. The patient is not nervous/anxious and does not have insomnia.      MEDICAL HISTORY:  Past Medical History:  Diagnosis Date  . Bulging lumbar disc   . Cancer ([place])    stage 4 lung cancer  . Heart attack [facility])     SURGICAL HISTORY: Past Surgical History:  Procedure Laterality Date  . CARDIAC CATHETERIZATION     two stents  . KNEE SURGERY  Left   . PORTA CATH INSERTION N/A 05/21/2018   Procedure: PORTA CATH INSERTION;  Surgeon: Algernon Huxley, MD;  Location: [facility];  Service: Cardiovascular;  Laterality: N/A;    SOCIAL HISTORY: Social History   Socioeconomic History  . Marital status: Single    Spouse name: Not on file  . Number of children: Not on file  . Years of education: Not on file  . Highest education level: Not on file  Occupational History  . Not on file  Tobacco Use  . Smoking status: Former Smoker    Packs/day: 1.50    Years: 15.00    Pack years: 22.50    Types: Cigarettes    Quit date: 11/03/2003    Years since quitting: 17.1  . Smokeless tobacco: Never Used  Vaping Use  . Vaping Use: Never used  Substance and Sexual Activity  . Alcohol use: Not Currently  . Drug use: No  . Sexual activity: Not Currently  Other Topics Concern  . Not on file  Social History Narrative  . Not on file   Social Determinants of Health   Financial Resource Strain: Not on file  Food Insecurity: Not on file  Transportation Needs: Not on file  Physical Activity: Not on file  Stress: Not on file  Social Connections: Not on file  Intimate Partner Violence: Not on file    FAMILY HISTORY: Family History  Problem Relation Age of Onset  . Heart failure Father   . Cancer Maternal Aunt   . Heart failure Maternal Uncle   .  Dementia Paternal Grandmother   . Cancer Maternal Aunt     ALLERGIES:  has No Known Allergies.  MEDICATIONS:  Current Outpatient Medications  Medication Sig Dispense Refill  . albuterol (VENTOLIN HFA) 108 (90 Base) MCG/ACT inhaler Inhale 2 puffs into the lungs every 6 (six) hours as needed for wheezing or shortness of breath. 18 g 3  . ALPRAZolam (XANAX) 0.5 MG tablet Take 1 tablet (0.5 mg total) by mouth 4 (four) times daily as needed for anxiety. 60 tablet 0  . apixaban (ELIQUIS) 5 MG TABS tablet Take 1 tablet (5 mg total) by mouth 2 (two) times daily. 180 tablet 3  .  dexamethasone (DECADRON) 4 MG tablet Take 1 tab two times a day the day before Alimta chemo, then take 2 tabs once a day for 3 days starting the day after chemo. 30 tablet 1  . FLUoxetine (PROZAC) 40 MG capsule Take 1 capsule (40 mg total) by mouth daily. 90 capsule 1  . folic acid (FOLVITE) 1 MG tablet Take 1 tablet (1 mg total) by mouth daily. Continue until 21 days after Alimta completed. 30 tablet 3  . OLANZapine (ZYPREXA) 5 MG tablet Take 1 tablet (5 mg total) by mouth at bedtime. 30 tablet 3  . Oxycodone HCl 20 MG TABS Take 1-1.5 tablets (20-30 mg total) by mouth every 4 (four) hours as needed. 90 tablet 0  . polyethylene glycol (MIRALAX / GLYCOLAX) packet Take 17 g by mouth daily.    . prochlorperazine (COMPAZINE) 10 MG tablet Take 1 tablet (10 mg total) by mouth every 6 (six) hours as needed for nausea or vomiting. 30 tablet 0  . senna (SENOKOT) 8.6 MG tablet Take 1 tablet by mouth as needed.     . Calcium 600-400 MG-UNIT CHEW Chew 2 tablets by mouth daily. (Patient not taking: Reported on 12/21/2020)     No current facility-administered medications for this visit.   Facility-Administered Medications Ordered in Other Visits  Medication Dose Route Frequency Provider Last Rate Last Admin  . 0.9 %  sodium chloride infusion   Intravenous Once Corcoran, Melissa C, MD      . 0.9 %  sodium chloride infusion   Intravenous Once PRN Corcoran, Melissa C, MD      . albuterol (PROVENTIL) (2.5 MG/3ML) 0.083% nebulizer solution 2.5 mg  2.5 mg Nebulization Once PRN Corcoran, Melissa C, MD      . alteplase (CATHFLO ACTIVASE) injection 2 mg  2 mg Intracatheter Once PRN Lequita Asal, MD      . cyanocobalamin ((VITAMIN B-12)) injection 1,000 mcg  1,000 mcg Intramuscular Once Lloyd Huger, MD      . EPINEPHrine (ADRENALIN) 1 MG/10ML injection 0.25 mg  0.25 mg Intravenous Once PRN Corcoran, Melissa C, MD      . EPINEPHrine (ADRENALIN) 1 MG/10ML injection 0.25 mg  0.25 mg Intravenous Once PRN  Corcoran, Melissa C, MD      . heparin lock flush 100 unit/mL  500 Units Intracatheter Once PRN Nolon Stalls C, MD      . heparin lock flush 100 unit/mL  250 Units Intracatheter Once PRN Nolon Stalls C, MD      . heparin lock flush 100 unit/mL  500 Units Intracatheter Once PRN Lloyd Huger, MD      . sodium chloride flush (NS) 0.9 % injection 10 mL  10 mL Intravenous PRN Lequita Asal, MD   10 mL at 03/04/19 0959  . sodium chloride flush (NS) 0.9 %  injection 10 mL  10 mL Intracatheter Once PRN Corcoran, Melissa C, MD      . sodium chloride flush (NS) 0.9 % injection 3 mL  3 mL Intracatheter Once PRN Lequita Asal, MD          .  PHYSICAL EXAMINATION: ECOG PERFORMANCE STATUS: 1 - Symptomatic but completely ambulatory  Vitals:   12/21/20 0915  BP: 116/69  Pulse: 76  Resp: 18  Temp: (!) 97 F (36.1 C)   Filed Weights   12/21/20 0915  Weight: 137 lb 12.8 oz (62.5 kg)    Physical Exam Constitutional:      Comments: Alone.  Ambulating independently.  HENT:     Head: Normocephalic and atraumatic.     Mouth/Throat:     Pharynx: No oropharyngeal exudate.  Eyes:     Pupils: Pupils are equal, round, and reactive to light.  Cardiovascular:     Rate and Rhythm: Normal rate and regular rhythm.  Pulmonary:     Effort: No respiratory distress.     Breath sounds: No wheezing.     Comments: Decreased breath sounds bilaterally in the bases. Abdominal:     General: Bowel sounds are normal. There is no distension.     Palpations: Abdomen is soft. There is no mass.     Tenderness: There is no abdominal tenderness. There is no guarding or rebound.  Musculoskeletal:        General: No tenderness. Normal range of motion.     Cervical back: Normal range of motion and neck supple.  Skin:    General: Skin is warm.  Neurological:     Mental Status: He is alert and oriented to person, place, and time.  Psychiatric:        Mood and Affect: Affect normal.       LABORATORY DATA:  I have reviewed the data as listed Lab Results  Component Value Date   WBC 11.0 (H) 12/21/2020   HGB 8.7 (L) 12/21/2020   HCT 28.7 (L) 12/21/2020   MCV 94.4 12/21/2020   PLT 440 (H) 12/21/2020   Recent Labs    03/12/20 1401 03/21/20 0904 04/11/20 0905 06/25/20 0851 11/06/20 0841 11/13/20 1002 11/27/20 0840 12/21/20 0847  NA 137 139 137   < > 136 134* 138 138  K 3.5 3.6 4.3   < > 3.7 3.8 4.0 3.8  CL 105 102 104   < > 102 99 104 105  CO2 25 26 22    < > 24 24 25 25   GLUCOSE 110* 134* 169*   < > 110* 117* 124* 132*  BUN 7* 17 9   < > 21 22 13 11   CREATININE 1.06 1.19 1.24   < > 1.13 1.21 1.27* 1.10  CALCIUM 8.7* 9.8 8.6*   < > 10.3 9.2 9.4 9.0  GFRNONAA >60 >60 59*   < > >60 >60 >60 >60  GFRAA >60 >60 >60  --   --   --   --   --   PROT  --  6.6 7.0   < > 7.0  --  6.8 7.0  ALBUMIN  --  3.4* 3.4*   < > 3.2* 3.6 3.0* 3.1*  AST  --  24 25   < > 23  --  19 21  ALT  --  13 11   < > 15  --  10 10  ALKPHOS  --  68 86   < > 75  --  91 89  BILITOT  --  0.5 0.5   < > 0.5  --  0.6 0.5   < > = values in this interval not displayed.    RADIOGRAPHIC STUDIES: I have personally reviewed the radiological images as listed and agreed with the findings in the report. No results found.  ASSESSMENT & PLAN:   Primary cancer of right lower lobe of lung ([place]) # Metastatic lung cancer-on keytruda+Alimta mainatence-APRIL 19th, 2022-  Slight interval enlargement of the right upper lobe subpleural lesion and new sub solid nodular density in the right upper lobe. [see below]; OLIGO-progression; however clinically stable [see below]  #Continue Keytruda-Alimta. Labs today reviewed;  acceptable for treatment today.   #Increasing size of the right upper lobe lung nodule-we will plan to get a PET scan; ?  SBRT.   #Back pain/bone mets-continue current oxycodone; Zometa every 6 weeks-?-Zometa today.  Calcium normal  #Anxiety and depression: STABLE;  Xanax as well as Prozac            5.Pulmonary embolism: Continue Eliquis.  6.   Chemotherapy induced anemia: on retacrit             # DISPOSITION: # chemo today; ADD RETACRIT; Zometa # 6/6 -udenyca;   # follow up in 3 weeks- MD labs (CBC with diff, CMP, Mg), PET prior Alimta and pembrolizumab;retacrit;  D-3 udenyca.  # I reviewed the blood work- with the patient in detail; also reviewed the imaging independently [as summarized above]; and with the patient in detail.   # 40 minutes face-to-face with the patient discussing the above plan of care; more than 50% of time spent on prognosis/ natural history; counseling and coordination.       All questions were answered. The patient knows to call the clinic with any problems, questions or concerns.       Cammie Sickle, MD 12/23/2020 4:49 PM

## 2020-12-21 NOTE — Assessment & Plan Note (Addendum)
#   Metastatic lung cancer-on keytruda+Alimta mainatence-APRIL 19th, 2022-  Slight interval enlargement of the right upper lobe subpleural lesion and new sub solid nodular density in the right upper lobe. [see below]; OLIGO-progression; however clinically stable [see below]  #Continue Keytruda-Alimta. Labs today reviewed;  acceptable for treatment today.   #Increasing size of the right upper lobe lung nodule-we will plan to get a PET scan; ?  SBRT.   #Back pain/bone mets-continue current oxycodone; Zometa every 6 weeks-?-Zometa today.  Calcium normal  #Anxiety and depression: STABLE;  Xanax as well as Prozac           5.Pulmonary embolism: Continue Eliquis.  6.   Chemotherapy induced anemia: on retacrit             # DISPOSITION: # chemo today; ADD RETACRIT; Zometa # 6/6 -udenyca;   # follow up in 3 weeks- MD labs (CBC with diff, CMP, Mg), PET prior Alimta and pembrolizumab;retacrit;  D-3 udenyca.  # I reviewed the blood work- with the patient in detail; also reviewed the imaging independently [as summarized above]; and with the patient in detail.   # 40 minutes face-to-face with the patient discussing the above plan of care; more than 50% of time spent on prognosis/ natural history; counseling and coordination.

## 2020-12-23 ENCOUNTER — Encounter: Payer: Self-pay | Admitting: Hematology and Oncology

## 2020-12-24 ENCOUNTER — Inpatient Hospital Stay: Payer: Medicare HMO | Attending: Internal Medicine

## 2020-12-24 DIAGNOSIS — C3431 Malignant neoplasm of lower lobe, right bronchus or lung: Secondary | ICD-10-CM | POA: Diagnosis not present

## 2020-12-24 DIAGNOSIS — Z5112 Encounter for antineoplastic immunotherapy: Secondary | ICD-10-CM | POA: Insufficient documentation

## 2020-12-24 DIAGNOSIS — C7951 Secondary malignant neoplasm of bone: Secondary | ICD-10-CM | POA: Diagnosis not present

## 2020-12-24 DIAGNOSIS — Z86711 Personal history of pulmonary embolism: Secondary | ICD-10-CM | POA: Diagnosis not present

## 2020-12-24 DIAGNOSIS — D6481 Anemia due to antineoplastic chemotherapy: Secondary | ICD-10-CM | POA: Diagnosis not present

## 2020-12-24 DIAGNOSIS — Z7901 Long term (current) use of anticoagulants: Secondary | ICD-10-CM | POA: Diagnosis not present

## 2020-12-24 DIAGNOSIS — Z79899 Other long term (current) drug therapy: Secondary | ICD-10-CM | POA: Diagnosis not present

## 2020-12-24 DIAGNOSIS — G893 Neoplasm related pain (acute) (chronic): Secondary | ICD-10-CM | POA: Diagnosis not present

## 2020-12-24 MED ORDER — PEGFILGRASTIM-CBQV 6 MG/0.6ML ~~LOC~~ SOSY
6.0000 mg | PREFILLED_SYRINGE | Freq: Once | SUBCUTANEOUS | Status: AC
Start: 2020-12-24 — End: 2020-12-24
  Administered 2020-12-24: 6 mg via SUBCUTANEOUS
  Filled 2020-12-24: qty 0.6

## 2020-12-31 ENCOUNTER — Other Ambulatory Visit: Payer: Self-pay | Admitting: *Deleted

## 2020-12-31 DIAGNOSIS — F419 Anxiety disorder, unspecified: Secondary | ICD-10-CM

## 2020-12-31 DIAGNOSIS — G893 Neoplasm related pain (acute) (chronic): Secondary | ICD-10-CM

## 2020-12-31 MED ORDER — ALPRAZOLAM 0.5 MG PO TABS: 0.5000 mg | ORAL_TABLET | Freq: Four times a day (QID) | ORAL | 0 refills | Status: DC | PRN

## 2020-12-31 MED ORDER — OXYCODONE HCL 20 MG PO TABS: 20.0000 mg | ORAL_TABLET | ORAL | 0 refills | Status: DC | PRN

## 2021-01-03 ENCOUNTER — Ambulatory Visit
Admission: RE | Admit: 2021-01-03 | Discharge: 2021-01-03 | Disposition: A | Discharge status: Home / Self Care | Payer: Medicare HMO | Source: Ambulatory Visit | Attending: Internal Medicine | Admitting: Internal Medicine

## 2021-01-03 ENCOUNTER — Inpatient Hospital Stay (HOSPITAL_BASED_OUTPATIENT_CLINIC_OR_DEPARTMENT_OTHER): Payer: Medicare HMO | Admitting: Hospice and Palliative Medicine

## 2021-01-03 ENCOUNTER — Other Ambulatory Visit: Payer: Self-pay

## 2021-01-03 DIAGNOSIS — R918 Other nonspecific abnormal finding of lung field: Secondary | ICD-10-CM | POA: Diagnosis present

## 2021-01-03 DIAGNOSIS — C3431 Malignant neoplasm of lower lobe, right bronchus or lung: Secondary | ICD-10-CM | POA: Diagnosis not present

## 2021-01-03 DIAGNOSIS — C3490 Malignant neoplasm of unspecified part of unspecified bronchus or lung: Secondary | ICD-10-CM | POA: Diagnosis not present

## 2021-01-03 DIAGNOSIS — J439 Emphysema, unspecified: Secondary | ICD-10-CM | POA: Diagnosis not present

## 2021-01-03 LAB — GLUCOSE, CAPILLARY: Glucose-Capillary: 105 mg/dL — ABNORMAL HIGH (ref 70–99)

## 2021-01-03 MED ORDER — FLUDEOXYGLUCOSE F - 18 (FDG) INJECTION
7.1000 | Freq: Once | INTRAVENOUS | Status: AC | PRN
  Administered 2021-01-03: 7.1 via INTRAVENOUS

## 2021-01-03 NOTE — Progress Notes (Signed)
Did not reach patient for scheduled MyChart visit.  Left him a voicemail.  Will reschedule.

## 2021-01-04 ENCOUNTER — Telehealth: Payer: Self-pay | Admitting: Internal Medicine

## 2021-01-04 NOTE — Telephone Encounter (Signed)
6/17-I left a voicemail for the patient that his right lower lobe lung nodule is slightly bigger than prior.  Awaiting to check with radiation oncology if-radiation could be offered.  Recommend call if any questions.

## 2021-01-06 ENCOUNTER — Telehealth: Payer: Self-pay | Admitting: Internal Medicine

## 2021-01-06 NOTE — Telephone Encounter (Signed)
Spoke to Dr.Crystal re: PET scan; candidate for SBRT.   Refer to Dr.Crystal re: Lung cancer.   GB

## 2021-01-11 ENCOUNTER — Inpatient Hospital Stay: Payer: Medicare HMO

## 2021-01-11 ENCOUNTER — Encounter: Payer: Self-pay | Admitting: Hematology and Oncology

## 2021-01-11 ENCOUNTER — Other Ambulatory Visit: Payer: Self-pay

## 2021-01-11 ENCOUNTER — Encounter: Payer: Self-pay | Admitting: Internal Medicine

## 2021-01-11 ENCOUNTER — Inpatient Hospital Stay (HOSPITAL_BASED_OUTPATIENT_CLINIC_OR_DEPARTMENT_OTHER): Payer: Medicare HMO | Admitting: Internal Medicine

## 2021-01-11 VITALS — BP 104/73 | HR 69 | Temp 96.0°F | Resp 16 | Wt 135.6 lb

## 2021-01-11 DIAGNOSIS — G893 Neoplasm related pain (acute) (chronic): Secondary | ICD-10-CM | POA: Diagnosis not present

## 2021-01-11 DIAGNOSIS — Z7901 Long term (current) use of anticoagulants: Secondary | ICD-10-CM | POA: Diagnosis not present

## 2021-01-11 DIAGNOSIS — D6481 Anemia due to antineoplastic chemotherapy: Secondary | ICD-10-CM | POA: Diagnosis not present

## 2021-01-11 DIAGNOSIS — R69 Illness, unspecified: Secondary | ICD-10-CM | POA: Diagnosis not present

## 2021-01-11 DIAGNOSIS — I2699 Other pulmonary embolism without acute cor pulmonale: Secondary | ICD-10-CM | POA: Diagnosis not present

## 2021-01-11 DIAGNOSIS — Z5112 Encounter for antineoplastic immunotherapy: Secondary | ICD-10-CM | POA: Diagnosis not present

## 2021-01-11 DIAGNOSIS — T451X5A Adverse effect of antineoplastic and immunosuppressive drugs, initial encounter: Secondary | ICD-10-CM | POA: Diagnosis not present

## 2021-01-11 DIAGNOSIS — Z86711 Personal history of pulmonary embolism: Secondary | ICD-10-CM | POA: Diagnosis not present

## 2021-01-11 DIAGNOSIS — C3431 Malignant neoplasm of lower lobe, right bronchus or lung: Secondary | ICD-10-CM

## 2021-01-11 DIAGNOSIS — C7951 Secondary malignant neoplasm of bone: Secondary | ICD-10-CM | POA: Diagnosis not present

## 2021-01-11 DIAGNOSIS — Z79899 Other long term (current) drug therapy: Secondary | ICD-10-CM | POA: Diagnosis not present

## 2021-01-11 LAB — COMPREHENSIVE METABOLIC PANEL
ALT: 9 U/L (ref 0–44)
AST: 22 U/L (ref 15–41)
Albumin: 3.3 g/dL — ABNORMAL LOW (ref 3.5–5.0)
Alkaline Phosphatase: 97 U/L (ref 38–126)
Anion gap: 10 (ref 5–15)
BUN: 16 mg/dL (ref 8–23)
CO2: 24 mmol/L (ref 22–32)
Calcium: 8.9 mg/dL (ref 8.9–10.3)
Chloride: 100 mmol/L (ref 98–111)
Creatinine, Ser: 1.63 mg/dL — ABNORMAL HIGH (ref 0.61–1.24)
GFR, Estimated: 45 mL/min — ABNORMAL LOW (ref >60)
Glucose, Bld: 160 mg/dL — ABNORMAL HIGH (ref 70–99)
Potassium: 4 mmol/L (ref 3.5–5.1)
Sodium: 134 mmol/L — ABNORMAL LOW (ref 135–145)
Total Bilirubin: 0.3 mg/dL (ref 0.3–1.2)
Total Protein: 7 g/dL (ref 6.5–8.1)

## 2021-01-11 LAB — CBC WITH DIFFERENTIAL/PLATELET
Abs Immature Granulocytes: 0.9 10*3/uL — ABNORMAL HIGH (ref 0.00–0.07)
Basophils Absolute: 0.1 10*3/uL (ref 0.0–0.1)
Basophils Relative: 0 %
Eosinophils Absolute: 0 10*3/uL (ref 0.0–0.5)
Eosinophils Relative: 0 %
HCT: 24.2 % — ABNORMAL LOW (ref 39.0–52.0)
Hemoglobin: 7.6 g/dL — ABNORMAL LOW (ref 13.0–17.0)
Immature Granulocytes: 4 %
Lymphocytes Relative: 4 %
Lymphs Abs: 0.9 10*3/uL (ref 0.7–4.0)
MCH: 29.6 pg (ref 26.0–34.0)
MCHC: 31.4 g/dL (ref 30.0–36.0)
MCV: 94.2 fL (ref 80.0–100.0)
Monocytes Absolute: 1 10*3/uL (ref 0.1–1.0)
Monocytes Relative: 4 %
Neutro Abs: 20.8 10*3/uL — ABNORMAL HIGH (ref 1.7–7.7)
Neutrophils Relative %: 88 %
Platelets: 450 10*3/uL — ABNORMAL HIGH (ref 150–400)
RBC: 2.57 MIL/uL — ABNORMAL LOW (ref 4.22–5.81)
RDW: 19.2 % — ABNORMAL HIGH (ref 11.5–15.5)
WBC: 23.6 10*3/uL — ABNORMAL HIGH (ref 4.0–10.5)
nRBC: 0 % (ref 0.0–0.2)

## 2021-01-11 LAB — MAGNESIUM: Magnesium: 1.9 mg/dL (ref 1.7–2.4)

## 2021-01-11 MED ORDER — HEPARIN SOD (PORK) LOCK FLUSH 100 UNIT/ML IV SOLN
500.0000 [IU] | Freq: Once | INTRAVENOUS | Status: DC | PRN
  Filled 2021-01-11: qty 5

## 2021-01-11 MED ORDER — SODIUM CHLORIDE 0.9% FLUSH
10.0000 mL | INTRAVENOUS | Status: DC | PRN
  Administered 2021-01-11: 10 mL via INTRAVENOUS
  Filled 2021-01-11: qty 10

## 2021-01-11 MED ORDER — CYANOCOBALAMIN 1000 MCG/ML IJ SOLN
1000.0000 ug | Freq: Once | INTRAMUSCULAR | Status: AC
  Administered 2021-01-11: 1000 ug via INTRAMUSCULAR
  Filled 2021-01-11: qty 1

## 2021-01-11 MED ORDER — EPOETIN ALFA-EPBX 10000 UNIT/ML IJ SOLN
10000.0000 [IU] | Freq: Once | INTRAMUSCULAR | Status: AC
  Administered 2021-01-11: 10000 [IU] via SUBCUTANEOUS
  Filled 2021-01-11: qty 1

## 2021-01-11 MED ORDER — SODIUM CHLORIDE 0.9 % IV SOLN
Freq: Once | INTRAVENOUS | Status: AC
  Filled 2021-01-11: qty 250

## 2021-01-11 MED ORDER — HEPARIN SOD (PORK) LOCK FLUSH 100 UNIT/ML IV SOLN
500.0000 [IU] | Freq: Once | INTRAVENOUS | Status: AC
Start: 2021-01-11 — End: 2021-01-11
  Administered 2021-01-11: 500 [IU] via INTRAVENOUS
  Filled 2021-01-11: qty 5

## 2021-01-11 MED ORDER — SODIUM CHLORIDE 0.9% FLUSH
10.0000 mL | INTRAVENOUS | Status: DC | PRN
  Filled 2021-01-11: qty 10

## 2021-01-11 MED ORDER — SODIUM CHLORIDE 0.9 % IV SOLN
200.0000 mg | Freq: Once | INTRAVENOUS | Status: AC
  Administered 2021-01-11: 200 mg via INTRAVENOUS
  Filled 2021-01-11: qty 8

## 2021-01-11 MED ORDER — SODIUM CHLORIDE 0.9 % IV SOLN: 700.0000 mg | Freq: Once | INTRAVENOUS | Status: DC

## 2021-01-11 NOTE — Assessment & Plan Note (Addendum)
#   Metastatic lung cancer-on keytruda+Alimta maintenance; PET scan June 2022-progressive right lower lobe hypermetabolic mass; no evidence of any distant metastatic disease or mediastinal adenopathy or osseous metastases.  #Continue Keytruda only today.  Hold Alimta [GFR 45- see below]. Labs today reviewed; hold Udenyca  # Leucocytosis- 23; reatcive/steroids- no infection.  Monitor for now..   # Increasing size of the right upper lobe lung nodule-discussed Dr. Donella Stade.  Plan SBRT.  #CKD stage III-GFR today 45; slight worsening proceed with Keytruda only.  Hold Alimta increase fluid intake.  #Back pain/bone mets-continue current oxycodone; Zometa every 6 weeks-?-Zometa today.  Calcium normal  #Anxiety and depression: STABLE;  Xanax as well as Prozac           #Pulmonary embolism: Continue Eliquis.  # Chemotherapy induced anemia: on retacrit hemoglobin around 10.             # DISPOSITION: # chemo today; Retacrit today # follow up on July 19th MD labs (CBC with diff, CMP, Mg), Alimta and pembrolizumab; retacrit;  D-2 Ellen Henri- Dr.B  # I reviewed the blood work- with the patient in detail; also reviewed the imaging independently [as summarized above]; and with the patient in detail.

## 2021-01-13 ENCOUNTER — Encounter: Payer: Self-pay | Admitting: Hematology and Oncology

## 2021-01-13 NOTE — Progress Notes (Signed)
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   *INTOLERANT to carboplatin at an AUC of 5 as well as 2. # NGS/MOLECULAR TESTS:[employer] One revealed no driver mutations  # PALLIATIVE CARE EVALUATION:  # PAIN MANAGEMENT:    DIAGNOSIS:   STAGE:         ;  GOALS:  CURRENT/MOST RECENT THERAPY :     Primary cancer of right lower lobe of lung ([place])  04/26/2018 Initial Diagnosis   Primary cancer of right lower lobe of lung ([place])    05/24/2018 - 01/10/2019 Chemotherapy   The patient had dexamethasone (DECADRON) 4 MG tablet, 1 of 1 cycle, Start date: 05/17/2018, End date: 08/12/2018 pembrolizumab (KEYTRUDA) 200 mg in sodium chloride 0.9 % 50 mL chemo infusion, 200 mg, Intravenous, Once, 11 of 14 cycles Administration: 200 mg (05/24/2018), 200 mg (07/16/2018), 200 mg (08/09/2018), 200 mg (08/30/2018), 200 mg (09/20/2018), 200 mg (06/14/2018), 200 mg (10/11/2018), 200 mg (11/08/2018), 200 mg (11/29/2018), 200 mg (12/20/2018), 200 mg (01/10/2019)   for chemotherapy treatment.     02/08/2019 - 04/12/2020 Chemotherapy   The patient had dexamethasone (DECADRON) 4 MG tablet, 1 of 1 cycle, Start date: 04/23/2020, End date: -- palonosetron (ALOXI) injection 0.25 mg, 0.25 mg, Intravenous,  Once, 21 of 23 cycles Administration: 0.25 mg (02/08/2019), 0.25 mg (03/29/2019), 0.25 mg (04/18/2019), 0.25 mg (02/28/2019), 0.25 mg (05/09/2019),  0.25 mg (05/30/2019), 0.25 mg (06/27/2019), 0.25 mg (07/18/2019), 0.25 mg (08/08/2019), 0.25 mg (08/30/2019), 0.25 mg (09/20/2019), 0.25 mg (10/11/2019), 0.25 mg (11/08/2019), 0.25 mg (11/29/2019), 0.25 mg (12/21/2019), 0.25 mg (01/18/2020), 0.25 mg (02/08/2020), 0.25 mg (02/29/2020), 0.25 mg (03/22/2020), 0.25 mg (04/11/2020) pegfilgrastim-jmdb (FULPHILA) injection 6 mg, 6 mg, Subcutaneous,  Once, 4 of 6 cycles Administration: 6 mg (03/23/2020), 6 mg (04/12/2020) PEMEtrexed (ALIMTA) 900 mg in sodium chloride 0.9 % 100 mL chemo infusion, 500 mg/m2 = 900 mg, Intravenous,  Once, 21 of 23 cycles Dose modification: 400 mg/m2 (original dose 500 mg/m2, Cycle 14, Reason: Provider Judgment, Comment: change in reanl function, although CrCl > 45 ml/min) Administration: 900 mg (02/08/2019), 900 mg (03/29/2019), 900 mg (04/18/2019), 900 mg (05/09/2019), 900 mg (05/30/2019), 900 mg (02/28/2019), 900 mg (06/27/2019), 900 mg (07/18/2019), 900 mg (08/08/2019), 900 mg (08/30/2019), 900 mg (09/20/2019), 900 mg (10/11/2019), 900 mg (11/08/2019), 700 mg (11/29/2019), 700 mg (01/18/2020), 700 mg (02/08/2020), 700 mg (02/29/2020), 700 mg (12/21/2019), 700 mg (03/22/2020), 700 mg (04/11/2020) CARBOplatin (PARAPLATIN) 500 mg in sodium chloride 0.9 % 250 mL chemo infusion, 470 mg (100 % of original dose 472 mg), Intravenous,  Once, 2 of 2 cycles Dose modification:   (original dose 472 mg, Cycle 1) Administration: 500 mg (02/08/2019), 190 mg (04/18/2019) pembrolizumab (KEYTRUDA) 200 mg in sodium chloride 0.9 % 50 mL chemo infusion, 200 mg, Intravenous, Once, 21 of 23 cycles Administration: 200 mg (02/08/2019), 200 mg (03/29/2019), 200 mg (04/18/2019), 200 mg (05/09/2019), 200 mg (05/30/2019), 200 mg (02/28/2019), 200 mg (06/27/2019), 200 mg (07/18/2019), 200 mg (08/08/2019), 200 mg (08/30/2019), 200 mg (09/20/2019), 200 mg (10/11/2019), 200  mg (11/08/2019), 200 mg (11/29/2019), 200 mg (12/21/2019), 200 mg (01/18/2020), 200 mg (02/08/2020), 200 mg (02/29/2020), 200 mg (03/22/2020), 200 mg  (04/11/2020) fosaprepitant (EMEND) 150 mg, dexamethasone (DECADRON) 12 mg in sodium chloride 0.9 % 145 mL IVPB, , Intravenous,  Once, 4 of 4 cycles Administration:  (02/08/2019),  (04/18/2019)   for chemotherapy treatment.     07/10/2020 -  Chemotherapy    Patient is on Treatment Plan: LUNG MAINTENANCE PEMETREXED + PEMBROLIZUMAB          HISTORY OF PRESENTING ILLNESS:  Jimmy West 70 y.o.  male history of metastatic lung cancer-non-small cell-currently on maintenance Alimta-Keytruda is here for follow-up/review results of the PET scan  Denies any worsening shortness of breath or cough.  Patient denies any blood in stools or black or stools.  No nausea vomiting.  Appetite is good.  Chronic back pain on oxycodone.  Review of Systems  Constitutional:  Positive for malaise/fatigue. Negative for chills, diaphoresis, fever and weight loss.  HENT:  Negative for nosebleeds and sore throat.   Eyes:  Negative for double vision.  Respiratory:  Negative for cough, hemoptysis, sputum production, shortness of breath and wheezing.   Cardiovascular:  Negative for chest pain, palpitations, orthopnea and leg swelling.  Gastrointestinal:  Negative for abdominal pain, blood in stool, constipation, diarrhea, heartburn, melena, nausea and vomiting.  Genitourinary:  Negative for dysuria, frequency and urgency.  Musculoskeletal:  Positive for back pain and joint pain.  Skin: Negative.  Negative for itching and rash.  Neurological:  Negative for dizziness, tingling, focal weakness, weakness and headaches.  Endo/Heme/Allergies:  Does not bruise/bleed easily.  Psychiatric/Behavioral:  Negative for depression. The patient is not nervous/anxious and does not have insomnia.     MEDICAL HISTORY:  Past Medical History:  Diagnosis Date  . Bulging lumbar disc   . Cancer ([place])    stage 4 lung cancer  . Heart attack [facility])     SURGICAL HISTORY: Past Surgical History:  Procedure Laterality Date  .  CARDIAC CATHETERIZATION     two stents  . KNEE SURGERY Left   . PORTA CATH INSERTION N/A 05/21/2018   Procedure: PORTA CATH INSERTION;  Surgeon: Algernon Huxley, MD;  Location: [facility];  Service: Cardiovascular;  Laterality: N/A;    SOCIAL HISTORY: Social History   Socioeconomic History  . Marital status: Single    Spouse name: Not on file  . Number of children: Not on file  . Years of education: Not on file  . Highest education level: Not on file  Occupational History  . Not on file  Tobacco Use  . Smoking status: Former    Packs/day: 1.50    Years: 15.00    Pack years: 22.50    Types: Cigarettes    Quit date: 11/03/2003    Years since quitting: 17.2  . Smokeless tobacco: Never  Vaping Use  . Vaping Use: Never used  Substance and Sexual Activity  . Alcohol use: Not Currently  . Drug use: No  . Sexual activity: Not Currently  Other Topics Concern  . Not on file  Social History Narrative  . Not on file   Social Determinants of Health   Financial Resource Strain: Not on file  Food Insecurity: Not on file  Transportation Needs: Not on file  Physical Activity: Not on file  Stress: Not on file  Social Connections: Not on file  Intimate Partner Violence: Not on file    FAMILY HISTORY: Family History  Problem Relation Age of Onset  . Heart failure Father   . Cancer Maternal Aunt   . Heart failure Maternal Uncle   . Dementia Paternal Grandmother   . Cancer Maternal Aunt     ALLERGIES:  has No Known Allergies.  MEDICATIONS:  Current Outpatient Medications  Medication Sig Dispense Refill  . albuterol (VENTOLIN HFA) 108 (90 Base) MCG/ACT inhaler Inhale 2 puffs into the lungs every 6 (six) hours as needed for wheezing or shortness of breath. 18 g 3  . ALPRAZolam (XANAX) 0.5 MG tablet Take 1 tablet (0.5 mg total) by mouth 4 (four) times daily as needed for anxiety. 60 tablet 0  . apixaban (ELIQUIS) 5 MG TABS tablet Take 1 tablet (5 mg total) by mouth 2  (two) times daily. 180 tablet 3  . dexamethasone (DECADRON) 4 MG tablet Take 1 tab two times a day the day before Alimta chemo, then take 2 tabs once a day for 3 days starting the day after chemo. 30 tablet 1  . FLUoxetine (PROZAC) 40 MG capsule Take 1 capsule (40 mg total) by mouth daily. 90 capsule 1  . folic acid (FOLVITE) 1 MG tablet Take 1 tablet (1 mg total) by mouth daily. Continue until 21 days after Alimta completed. 30 tablet 3  . OLANZapine (ZYPREXA) 5 MG tablet Take 1 tablet (5 mg total) by mouth at bedtime. 30 tablet 3  . Oxycodone HCl 20 MG TABS Take 1-1.5 tablets (20-30 mg total) by mouth every 4 (four) hours as needed. 90 tablet 0  . polyethylene glycol (MIRALAX / GLYCOLAX) packet Take 17 g by mouth daily.    . prochlorperazine (COMPAZINE) 10 MG tablet Take 1 tablet (10 mg total) by mouth every 6 (six) hours as needed for nausea or vomiting. 30 tablet 0  . senna (SENOKOT) 8.6 MG tablet Take 1 tablet by mouth as needed.     . Calcium 600-400 MG-UNIT CHEW Chew 2 tablets by mouth daily. (Patient not taking: No sig reported)     No current facility-administered medications for this visit.   Facility-Administered Medications Ordered in Other Visits  Medication Dose Route Frequency Provider Last Rate Last Admin  . 0.9 %  sodium chloride infusion   Intravenous Once Corcoran, Melissa C, MD      . 0.9 %  sodium chloride infusion   Intravenous Once PRN Corcoran, Melissa C, MD      . albuterol (PROVENTIL) (2.5 MG/3ML) 0.083% nebulizer solution 2.5 mg  2.5 mg Nebulization Once PRN Corcoran, Melissa C, MD      . alteplase (CATHFLO ACTIVASE) injection 2 mg  2 mg Intracatheter Once PRN Lequita Asal, MD      . cyanocobalamin ((VITAMIN B-12)) injection 1,000 mcg  1,000 mcg Intramuscular Once Lloyd Huger, MD      . EPINEPHrine (ADRENALIN) 1 MG/10ML injection 0.25 mg  0.25 mg Intravenous Once PRN Corcoran, Melissa C, MD      . EPINEPHrine (ADRENALIN) 1 MG/10ML injection 0.25 mg  0.25  mg Intravenous Once PRN Corcoran, Melissa C, MD      . heparin lock flush 100 unit/mL  500 Units Intracatheter Once PRN Mike Gip, Melissa C, MD      . heparin lock flush 100 unit/mL  250 Units Intracatheter Once PRN Nolon Stalls C, MD      . heparin lock flush 100 unit/mL  500 Units Intracatheter Once PRN Lloyd Huger, MD      . sodium chloride flush (NS) 0.9 % injection  10 mL  10 mL Intravenous PRN Lequita Asal, MD   10 mL at 03/04/19 0959  . sodium chloride flush (NS) 0.9 % injection 10 mL  10 mL Intracatheter Once PRN Corcoran, Melissa C, MD      . sodium chloride flush (NS) 0.9 % injection 3 mL  3 mL Intracatheter Once PRN Lequita Asal, MD          .  PHYSICAL EXAMINATION: ECOG PERFORMANCE STATUS: 1 - Symptomatic but completely ambulatory  Vitals:   01/11/21 0845  BP: 104/73  Pulse: 69  Resp: 16  Temp: (!) 96 F (35.6 C)  SpO2: 99%   Filed Weights   01/11/21 0845  Weight: 135 lb 9.3 oz (61.5 kg)    Physical Exam Constitutional:      Comments: Alone.  Ambulating independently.  HENT:     Head: Normocephalic and atraumatic.     Mouth/Throat:     Pharynx: No oropharyngeal exudate.  Eyes:     Pupils: Pupils are equal, round, and reactive to light.  Cardiovascular:     Rate and Rhythm: Normal rate and regular rhythm.  Pulmonary:     Effort: No respiratory distress.     Breath sounds: No wheezing.     Comments: Decreased breath sounds bilaterally in the bases. Abdominal:     General: Bowel sounds are normal. There is no distension.     Palpations: Abdomen is soft. There is no mass.     Tenderness: There is no abdominal tenderness. There is no guarding or rebound.  Musculoskeletal:        General: No tenderness. Normal range of motion.     Cervical back: Normal range of motion and neck supple.  Skin:    General: Skin is warm.  Neurological:     Mental Status: He is alert and oriented to person, place, and time.  Psychiatric:        Mood  and Affect: Affect normal.     LABORATORY DATA:  I have reviewed the data as listed Lab Results  Component Value Date   WBC 23.6 (H) 01/11/2021   HGB 7.6 (L) 01/11/2021   HCT 24.2 (L) 01/11/2021   MCV 94.2 01/11/2021   PLT 450 (H) 01/11/2021   Recent Labs    03/12/20 1401 03/21/20 0904 04/11/20 0905 06/25/20 0851 11/27/20 0840 12/21/20 0847 01/11/21 0831  NA 137 139 137   < > 138 138 134*  K 3.5 3.6 4.3   < > 4.0 3.8 4.0  CL 105 102 104   < > 104 105 100  CO2 25 26 22    < > 25 25 24   GLUCOSE 110* 134* 169*   < > 124* 132* 160*  BUN 7* 17 9   < > 13 11 16   CREATININE 1.06 1.19 1.24   < > 1.27* 1.10 1.63*  CALCIUM 8.7* 9.8 8.6*   < > 9.4 9.0 8.9  GFRNONAA >60 >60 59*   < > >60 >60 45*  GFRAA >60 >60 >60  --   --   --   --   PROT  --  6.6 7.0   < > 6.8 7.0 7.0  ALBUMIN  --  3.4* 3.4*   < > 3.0* 3.1* 3.3*  AST  --  24 25   < > 19 21 22   ALT  --  13 11   < > 10 10 9   ALKPHOS  --  68 86   < >  91 89 97  BILITOT  --  0.5 0.5   < > 0.6 0.5 0.3   < > = values in this interval not displayed.    RADIOGRAPHIC STUDIES: I have personally reviewed the radiological images as listed and agreed with the findings in the report. NM PET Image Restag (PS) Skull Base To Thigh  Result Date: 01/03/2021 CLINICAL DATA:  Subsequent treatment strategy for non-small cell lung cancer. EXAM: NUCLEAR MEDICINE PET SKULL BASE TO THIGH TECHNIQUE: 7.1 mCi F-18 FDG was injected intravenously. Full-ring PET imaging was performed from the skull base to thigh after the radiotracer. CT data was obtained and used for attenuation correction and anatomic localization. Fasting blood glucose: 105 mg/dl COMPARISON:  PET-CT 07/05/2020 FINDINGS: Mediastinal blood pool activity: SUV max 1.77 Liver activity: SUV max NA NECK: No hypermetabolic lymph nodes in the neck. Incidental CT findings: none CHEST: The right lower lobe lung mass has enlarged since the prior CT scan from April. It measures approximately 4.2 x 3.2 cm and  previously measured 3.3 x 3.3 cm. It is hypermetabolic with SUV max of 60.45. Findings consistent with progressive tumor. No enlarged or hypermetabolic mediastinal or hilar lymph nodes to suggest metastatic adenopathy. No supraclavicular or axillary adenopathy. Stable significant underlying lung disease with emphysema and pulmonary scarring. Stable elongated subpleural pulmonary lesion in the right upper lobe on image number 77/3 measuring 13 mm. No hypermetabolism is demonstrated. No new pulmonary lesions. Stable bilateral lower lobe subpleural density is mildly hypermetabolic and most likely active inflammation. Incidental CT findings: Stable aortic and coronary artery calcifications. Stable partially calcified left ventricular aneurysm. ABDOMEN/PELVIS: No findings suspicious for abdominal/pelvic metastatic disease. Incidental CT findings: Stable vascular calcifications. SKELETON: Diffuse marrow uptake likely due to chemotherapy or marrow stimulating drugs. No findings suspicious for osseous metastatic disease. Incidental CT findings: none IMPRESSION: 1. Enlarging hypermetabolic right lower lobe lung mass consistent with progressive tumor. 2. No enlarged or hypermetabolic mediastinal or hilar lymph nodes to suggest metastatic chest adenopathy. 3. No new pulmonary lesions or pulmonary nodules. 4. No findings for abdominal/pelvic metastatic disease or osseous metastatic disease. Electronically Signed   By: Marijo Sanes M.D.   On: 01/03/2021 14:52    ASSESSMENT & PLAN:   Primary cancer of right lower lobe of lung [facility]) # Metastatic lung cancer-on keytruda+Alimta maintenance; PET scan June 2022-progressive right lower lobe hypermetabolic mass; no evidence of any distant metastatic disease or mediastinal adenopathy or osseous metastases.  #Continue Keytruda only today.  Hold Alimta [GFR 45- see below]. Labs today reviewed; hold Udenyca  # Leucocytosis- 23; reatcive/steroids- no infection.  Monitor for now..    # Increasing size of the right upper lobe lung nodule-discussed Dr. Donella Stade.  Plan SBRT.  #CKD stage III-GFR today 45; slight worsening proceed with Keytruda only.  Hold Alimta increase fluid intake.  #Back pain/bone mets-continue current oxycodone; Zometa every 6 weeks-?-Zometa today.  Calcium normal  # Anxiety and depression: STABLE;  Xanax as well as Prozac           #  Pulmonary embolism:  Continue Eliquis.  # Chemotherapy induced anemia: on retacrit hemoglobin around 10.             # DISPOSITION: # chemo today; Retacrit today # follow up on July 19th MD labs (CBC with diff, CMP, Mg), Alimta and pembrolizumab; retacrit;  D-2 Ellen Henri- Dr.B  # I reviewed the blood work- with the patient in detail; also reviewed the imaging independently [as summarized above]; and with the patient  in detail.         All questions were answered. The patient knows to call the clinic with any problems, questions or concerns.       Cammie Sickle, MD 01/13/2021 4:55 PM

## 2021-01-14 ENCOUNTER — Inpatient Hospital Stay: Payer: Medicare HMO

## 2021-01-14 ENCOUNTER — Other Ambulatory Visit: Payer: Self-pay

## 2021-01-14 ENCOUNTER — Ambulatory Visit
Admission: RE | Admit: 2021-01-14 | Discharge: 2021-01-14 | Disposition: A | Discharge status: Home / Self Care | Payer: Medicare HMO | Source: Ambulatory Visit | Attending: Radiation Oncology | Admitting: Radiation Oncology

## 2021-01-14 VITALS — BP 110/66 | HR 69 | Temp 97.5°F | Resp 16 | Wt 145.9 lb

## 2021-01-14 DIAGNOSIS — C3431 Malignant neoplasm of lower lobe, right bronchus or lung: Secondary | ICD-10-CM

## 2021-01-14 DIAGNOSIS — C7951 Secondary malignant neoplasm of bone: Secondary | ICD-10-CM | POA: Diagnosis not present

## 2021-01-14 DIAGNOSIS — R5381 Other malaise: Secondary | ICD-10-CM | POA: Diagnosis not present

## 2021-01-14 DIAGNOSIS — Z923 Personal history of irradiation: Secondary | ICD-10-CM | POA: Diagnosis not present

## 2021-01-14 DIAGNOSIS — Z87891 Personal history of nicotine dependence: Secondary | ICD-10-CM | POA: Diagnosis not present

## 2021-01-14 DIAGNOSIS — R5383 Other fatigue: Secondary | ICD-10-CM | POA: Diagnosis not present

## 2021-01-14 NOTE — Progress Notes (Signed)
Radiation Oncology Follow up Note OP N/A old patient new area right lower lobe lung SBRT  Name: Jimmy West   Date:   01/14/2021 MRN:  [id number] DOB: Mar 11, 1951    This 70 y.o. male presents to the clinic today for evaluation of a progressive right lower lobe adenocarcinoma of the lung.  REFERRING PROVIDER: No ref. provider found  HPI: Patient is a 70 year old male well-known to our department have received palliative radiation therapy to his thoracic spine for known stage IV lung cancer back in 2019.Marland Kitchen  He has done well with excellent pain relief.  He is currently onmaintenance Alimta-Keytruda which she is tolerating well with some malaise and fatigue.  He has had a complete response everywhere except in the right lower lobe with a progressive hypermetabolic mass consistent with adenocarcinoma of the lung.  He has no evidence of distant metastatic disease mediastinal adenopathy or osseous metastasis at this time.  He does have a slight nonproductive cough.  No dysphagia or bone pain.  COMPLICATIONS OF TREATMENT: none  FOLLOW UP COMPLIANCE: keeps appointments   PHYSICAL EXAM:  BP 110/66   Pulse 69   Temp (!) 97.5 F (36.4 C) (Tympanic)   Resp 16   Wt 145 lb 14.4 oz (66.2 kg) Comment: weighed times 2  BMI 23.55 kg/m  Well-developed well-nourished patient in NAD. HEENT reveals PERLA, EOMI, discs not visualized.  Oral cavity is clear. No oral mucosal lesions are identified. Neck is clear without evidence of cervical or supraclavicular adenopathy. Lungs are clear to A&P. Cardiac examination is essentially unremarkable with regular rate and rhythm without murmur rub or thrill. Abdomen is benign with no organomegaly or masses noted. Motor sensory and DTR levels are equal and symmetric in the upper and lower extremities. Cranial nerves II through XII are grossly intact. Proprioception is intact. No peripheral adenopathy or edema is identified. No motor or sensory levels are noted. Crude  visual fields are within normal range.  RADIOLOGY RESULTS: Serial CT scans and PET CT scans reviewed compatible with above-stated findings  PLAN: At this time elect go with SBRT to his right lower lobe lesion.  We will plan on delivering 60 Gray in 5 fractions.  We will use 4-dimensional treatment planning his most as well as motion restriction during treatment planning.  Risks and the benefits of treatment including possible slight fatigue alteration of lung parenchyma and possible cough all were discussed in detail with the patient.  I have personally set up and ordered CT simulation for SBRT later this week.  I would like to take this opportunity to thank you for allowing me to participate in the care of your patient.Noreene Filbert, MD

## 2021-01-15 ENCOUNTER — Other Ambulatory Visit: Payer: Self-pay | Admitting: *Deleted

## 2021-01-15 DIAGNOSIS — F419 Anxiety disorder, unspecified: Secondary | ICD-10-CM

## 2021-01-15 DIAGNOSIS — G893 Neoplasm related pain (acute) (chronic): Secondary | ICD-10-CM

## 2021-01-15 MED ORDER — OXYCODONE HCL 20 MG PO TABS: 20.0000 mg | ORAL_TABLET | ORAL | 0 refills | Status: DC | PRN

## 2021-01-15 MED ORDER — ALPRAZOLAM 0.5 MG PO TABS: 0.5000 mg | ORAL_TABLET | Freq: Four times a day (QID) | ORAL | 0 refills | Status: DC | PRN

## 2021-01-17 ENCOUNTER — Ambulatory Visit
Admission: RE | Admit: 2021-01-17 | Discharge: 2021-01-17 | Disposition: A | Discharge status: Home / Self Care | Payer: Medicare HMO | Source: Ambulatory Visit | Attending: Radiation Oncology | Admitting: Radiation Oncology

## 2021-01-17 DIAGNOSIS — C3431 Malignant neoplasm of lower lobe, right bronchus or lung: Secondary | ICD-10-CM | POA: Insufficient documentation

## 2021-01-17 DIAGNOSIS — Z87891 Personal history of nicotine dependence: Secondary | ICD-10-CM | POA: Diagnosis not present

## 2021-01-17 DIAGNOSIS — C7951 Secondary malignant neoplasm of bone: Secondary | ICD-10-CM | POA: Diagnosis not present

## 2021-01-18 ENCOUNTER — Telehealth: Payer: Self-pay | Admitting: *Deleted

## 2021-01-18 ENCOUNTER — Telehealth: Payer: Self-pay

## 2021-01-18 NOTE — Telephone Encounter (Signed)
Per Dr. B there is not much to do at this time due to the edema or swelling on his ankle. Informed pt to keep an eye out for it, keep it elevated and if anything worsens please contact the cancer center. Pt agrees and understands.

## 2021-01-18 NOTE — Telephone Encounter (Signed)
Patient requested return call for ankle edema. Belenda Cruise- will you reach out to the patient and find out more details. When did the edema start? Does it get any better with elevation? Are the ankles hot to touch and any pain in ankles.

## 2021-01-24 DIAGNOSIS — C3431 Malignant neoplasm of lower lobe, right bronchus or lung: Secondary | ICD-10-CM | POA: Insufficient documentation

## 2021-01-24 DIAGNOSIS — C7951 Secondary malignant neoplasm of bone: Secondary | ICD-10-CM | POA: Diagnosis not present

## 2021-01-24 DIAGNOSIS — Z87891 Personal history of nicotine dependence: Secondary | ICD-10-CM | POA: Diagnosis not present

## 2021-01-29 ENCOUNTER — Ambulatory Visit
Admission: RE | Admit: 2021-01-29 | Discharge: 2021-01-29 | Disposition: A | Discharge status: Home / Self Care | Payer: Medicare HMO | Source: Ambulatory Visit | Attending: Radiation Oncology | Admitting: Radiation Oncology

## 2021-01-29 DIAGNOSIS — Z87891 Personal history of nicotine dependence: Secondary | ICD-10-CM | POA: Diagnosis not present

## 2021-01-29 DIAGNOSIS — C7951 Secondary malignant neoplasm of bone: Secondary | ICD-10-CM | POA: Diagnosis not present

## 2021-01-29 DIAGNOSIS — C3431 Malignant neoplasm of lower lobe, right bronchus or lung: Secondary | ICD-10-CM | POA: Diagnosis not present

## 2021-01-30 ENCOUNTER — Ambulatory Visit
Admission: RE | Admit: 2021-01-30 | Discharge: 2021-01-30 | Disposition: A | Discharge status: Home / Self Care | Payer: Medicare HMO | Source: Ambulatory Visit | Attending: Radiation Oncology | Admitting: Radiation Oncology

## 2021-01-30 ENCOUNTER — Other Ambulatory Visit: Payer: Self-pay | Admitting: *Deleted

## 2021-01-30 DIAGNOSIS — F419 Anxiety disorder, unspecified: Secondary | ICD-10-CM

## 2021-01-30 DIAGNOSIS — C7951 Secondary malignant neoplasm of bone: Secondary | ICD-10-CM | POA: Diagnosis not present

## 2021-01-30 DIAGNOSIS — G893 Neoplasm related pain (acute) (chronic): Secondary | ICD-10-CM

## 2021-01-30 DIAGNOSIS — Z87891 Personal history of nicotine dependence: Secondary | ICD-10-CM | POA: Diagnosis not present

## 2021-01-30 DIAGNOSIS — C3431 Malignant neoplasm of lower lobe, right bronchus or lung: Secondary | ICD-10-CM | POA: Diagnosis not present

## 2021-01-30 MED ORDER — OXYCODONE HCL 20 MG PO TABS: 20.0000 mg | ORAL_TABLET | ORAL | 0 refills | Status: DC | PRN

## 2021-01-30 MED ORDER — ALPRAZOLAM 0.5 MG PO TABS: 0.5000 mg | ORAL_TABLET | Freq: Four times a day (QID) | ORAL | 0 refills | Status: DC | PRN

## 2021-01-31 ENCOUNTER — Ambulatory Visit
Admission: RE | Admit: 2021-01-31 | Discharge: 2021-01-31 | Disposition: A | Discharge status: Home / Self Care | Payer: Medicare HMO | Source: Ambulatory Visit | Attending: Radiation Oncology | Admitting: Radiation Oncology

## 2021-01-31 ENCOUNTER — Inpatient Hospital Stay: Payer: Medicare HMO

## 2021-01-31 DIAGNOSIS — C3431 Malignant neoplasm of lower lobe, right bronchus or lung: Secondary | ICD-10-CM | POA: Diagnosis not present

## 2021-01-31 DIAGNOSIS — C7951 Secondary malignant neoplasm of bone: Secondary | ICD-10-CM | POA: Diagnosis not present

## 2021-01-31 DIAGNOSIS — Z87891 Personal history of nicotine dependence: Secondary | ICD-10-CM | POA: Diagnosis not present

## 2021-02-01 ENCOUNTER — Ambulatory Visit: Payer: Medicare HMO

## 2021-02-04 ENCOUNTER — Ambulatory Visit
Admission: RE | Admit: 2021-02-04 | Discharge: 2021-02-04 | Disposition: A | Discharge status: Home / Self Care | Payer: Medicare HMO | Source: Ambulatory Visit | Attending: Radiation Oncology | Admitting: Radiation Oncology

## 2021-02-04 DIAGNOSIS — Z87891 Personal history of nicotine dependence: Secondary | ICD-10-CM | POA: Diagnosis not present

## 2021-02-04 DIAGNOSIS — C3431 Malignant neoplasm of lower lobe, right bronchus or lung: Secondary | ICD-10-CM | POA: Diagnosis not present

## 2021-02-04 DIAGNOSIS — C7951 Secondary malignant neoplasm of bone: Secondary | ICD-10-CM | POA: Diagnosis not present

## 2021-02-05 ENCOUNTER — Other Ambulatory Visit: Payer: Self-pay

## 2021-02-05 ENCOUNTER — Inpatient Hospital Stay: Payer: Medicare HMO

## 2021-02-05 ENCOUNTER — Inpatient Hospital Stay (HOSPITAL_BASED_OUTPATIENT_CLINIC_OR_DEPARTMENT_OTHER): Payer: Medicare HMO | Admitting: Internal Medicine

## 2021-02-05 ENCOUNTER — Inpatient Hospital Stay: Payer: Medicare HMO | Attending: Internal Medicine

## 2021-02-05 ENCOUNTER — Ambulatory Visit
Admission: RE | Admit: 2021-02-05 | Discharge: 2021-02-05 | Disposition: A | Discharge status: Home / Self Care | Payer: Medicare HMO | Source: Ambulatory Visit | Attending: Radiation Oncology | Admitting: Radiation Oncology

## 2021-02-05 VITALS — BP 116/76 | HR 85 | Temp 97.3°F | Resp 20

## 2021-02-05 DIAGNOSIS — C7951 Secondary malignant neoplasm of bone: Secondary | ICD-10-CM | POA: Insufficient documentation

## 2021-02-05 DIAGNOSIS — C3431 Malignant neoplasm of lower lobe, right bronchus or lung: Secondary | ICD-10-CM | POA: Diagnosis not present

## 2021-02-05 DIAGNOSIS — Z7901 Long term (current) use of anticoagulants: Secondary | ICD-10-CM | POA: Insufficient documentation

## 2021-02-05 DIAGNOSIS — D6481 Anemia due to antineoplastic chemotherapy: Secondary | ICD-10-CM | POA: Diagnosis not present

## 2021-02-05 DIAGNOSIS — G893 Neoplasm related pain (acute) (chronic): Secondary | ICD-10-CM | POA: Diagnosis not present

## 2021-02-05 DIAGNOSIS — Z86711 Personal history of pulmonary embolism: Secondary | ICD-10-CM | POA: Insufficient documentation

## 2021-02-05 DIAGNOSIS — Z79899 Other long term (current) drug therapy: Secondary | ICD-10-CM | POA: Diagnosis not present

## 2021-02-05 DIAGNOSIS — Z5112 Encounter for antineoplastic immunotherapy: Secondary | ICD-10-CM | POA: Insufficient documentation

## 2021-02-05 DIAGNOSIS — Z87891 Personal history of nicotine dependence: Secondary | ICD-10-CM | POA: Diagnosis not present

## 2021-02-05 LAB — CBC WITH DIFFERENTIAL/PLATELET
Abs Immature Granulocytes: 0.04 10*3/uL (ref 0.00–0.07)
Basophils Absolute: 0.1 10*3/uL (ref 0.0–0.1)
Basophils Relative: 1 %
Eosinophils Absolute: 0.5 10*3/uL (ref 0.0–0.5)
Eosinophils Relative: 8 %
HCT: 29.3 % — ABNORMAL LOW (ref 39.0–52.0)
Hemoglobin: 9.1 g/dL — ABNORMAL LOW (ref 13.0–17.0)
Immature Granulocytes: 1 %
Lymphocytes Relative: 10 %
Lymphs Abs: 0.6 10*3/uL — ABNORMAL LOW (ref 0.7–4.0)
MCH: 27.5 pg (ref 26.0–34.0)
MCHC: 31.1 g/dL (ref 30.0–36.0)
MCV: 88.5 fL (ref 80.0–100.0)
Monocytes Absolute: 1 10*3/uL (ref 0.1–1.0)
Monocytes Relative: 16 %
Neutro Abs: 4.1 10*3/uL (ref 1.7–7.7)
Neutrophils Relative %: 64 %
Platelets: 332 10*3/uL (ref 150–400)
RBC: 3.31 MIL/uL — ABNORMAL LOW (ref 4.22–5.81)
RDW: 16.3 % — ABNORMAL HIGH (ref 11.5–15.5)
WBC: 6.3 10*3/uL (ref 4.0–10.5)
nRBC: 0 % (ref 0.0–0.2)

## 2021-02-05 LAB — MAGNESIUM: Magnesium: 1.6 mg/dL — ABNORMAL LOW (ref 1.7–2.4)

## 2021-02-05 LAB — COMPREHENSIVE METABOLIC PANEL
ALT: 7 U/L (ref 0–44)
AST: 18 U/L (ref 15–41)
Albumin: 3.3 g/dL — ABNORMAL LOW (ref 3.5–5.0)
Alkaline Phosphatase: 70 U/L (ref 38–126)
Anion gap: 9 (ref 5–15)
BUN: 9 mg/dL (ref 8–23)
CO2: 26 mmol/L (ref 22–32)
Calcium: 9.1 mg/dL (ref 8.9–10.3)
Chloride: 102 mmol/L (ref 98–111)
Creatinine, Ser: 1.21 mg/dL (ref 0.61–1.24)
GFR, Estimated: >60 mL/min (ref >60)
Glucose, Bld: 105 mg/dL — ABNORMAL HIGH (ref 70–99)
Potassium: 3.8 mmol/L (ref 3.5–5.1)
Sodium: 137 mmol/L (ref 135–145)
Total Bilirubin: 0.8 mg/dL (ref 0.3–1.2)
Total Protein: 7 g/dL (ref 6.5–8.1)

## 2021-02-05 MED ORDER — SODIUM CHLORIDE 0.9% FLUSH
10.0000 mL | INTRAVENOUS | Status: DC | PRN
  Filled 2021-02-05: qty 10

## 2021-02-05 MED ORDER — HEPARIN SOD (PORK) LOCK FLUSH 100 UNIT/ML IV SOLN
500.0000 [IU] | Freq: Once | INTRAVENOUS | Status: AC | PRN
  Administered 2021-02-05: 500 [IU]
  Filled 2021-02-05: qty 5

## 2021-02-05 MED ORDER — SODIUM CHLORIDE 0.9 % IV SOLN
700.0000 mg | Freq: Once | INTRAVENOUS | Status: AC
  Administered 2021-02-05: 700 mg via INTRAVENOUS
  Filled 2021-02-05: qty 20

## 2021-02-05 MED ORDER — DEXAMETHASONE 4 MG PO TABS: ORAL_TABLET | ORAL | 1 refills | Status: DC

## 2021-02-05 MED ORDER — EPOETIN ALFA-EPBX 10000 UNIT/ML IJ SOLN
10000.0000 [IU] | Freq: Once | INTRAMUSCULAR | Status: AC
  Administered 2021-02-05: 10000 [IU] via SUBCUTANEOUS
  Filled 2021-02-05: qty 1

## 2021-02-05 MED ORDER — SODIUM CHLORIDE 0.9 % IV SOLN
Freq: Once | INTRAVENOUS | Status: AC
  Filled 2021-02-05: qty 250

## 2021-02-05 NOTE — Assessment & Plan Note (Addendum)
#   Metastatic lung cancer-on keytruda+Alimta maintenance; PET scan June 2022-progressive right lower lobe hypermetabolic mass; no evidence of any distant metastatic disease or mediastinal adenopathy or osseous metastases.   # HOLD Beryle Flock [today- given SBRT-given the ongoing concern for pneumonitis while on immunotherapy/radiation].  Proceed with Alimta; patient will need growth factor Support.  # Increasing size of the right upper lobe lung nodule currently undergoing SBRT [July 26th- last].  Tolerating well.  #CKD stage III-GFR-GFR 60.  Proceed with Alimta.  #Back pain/bone mets-continue current oxycodone; Zometa every 6 weeks-?-Zometa today.  Calcium normal stable  #Anxiety and depression: Stable;  Xanax as well as Prozac           #Pulmonary embolism: Continue Eliquis.  # Chemotherapy induced anemia: on retacrit hemoglobin around 9.1 proceed with Retacrit.            # DISPOSITION: # ALimta today; Jimmy West; Retacrit today # follow up in 3 weeks- MD (CBC with diff, CMP, Mg), Alimta and pembrolizumab; retacrit;  D-2 Jimmy West- Dr.B

## 2021-02-05 NOTE — Progress Notes (Signed)
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   *INTOLERANT to carboplatin at an AUC of 5 as well as 2. # NGS/MOLECULAR TESTS:Foundation One revealed no driver mutations  # PALLIATIVE CARE EVALUATION:  # PAIN MANAGEMENT:    DIAGNOSIS:   STAGE:         ;  GOALS:  CURRENT/MOST RECENT THERAPY :     Primary cancer of right lower lobe of lung ([place])  04/26/2018 Initial Diagnosis   Primary cancer of right lower lobe of lung ([place])    05/24/2018 - 01/10/2019 Chemotherapy   The patient had dexamethasone (DECADRON) 4 MG tablet, 1 of 1 cycle, Start date: 05/17/2018, End date: 08/12/2018 pembrolizumab (KEYTRUDA) 200 mg in sodium chloride 0.9 % 50 mL chemo infusion, 200 mg, Intravenous, Once, 11 of 14 cycles Administration: 200 mg (05/24/2018), 200 mg (07/16/2018), 200 mg (08/09/2018), 200 mg (08/30/2018), 200 mg (09/20/2018), 200 mg (06/14/2018), 200 mg (10/11/2018), 200 mg (11/08/2018), 200 mg (11/29/2018), 200 mg (12/20/2018), 200 mg (01/10/2019)   for chemotherapy treatment.     02/08/2019 - 04/12/2020 Chemotherapy   The patient had dexamethasone (DECADRON) 4 MG tablet, 1 of 1 cycle, Start date: 04/23/2020, End date: -- palonosetron (ALOXI) injection 0.25 mg, 0.25 mg, Intravenous,  Once, 21 of 23 cycles Administration: 0.25 mg (02/08/2019), 0.25 mg (03/29/2019), 0.25 mg (04/18/2019), 0.25 mg (02/28/2019), 0.25 mg (05/09/2019), 0.25 mg  (05/30/2019), 0.25 mg (06/27/2019), 0.25 mg (07/18/2019), 0.25 mg (08/08/2019), 0.25 mg (08/30/2019), 0.25 mg (09/20/2019), 0.25 mg (10/11/2019), 0.25 mg (11/08/2019), 0.25 mg (11/29/2019), 0.25 mg (12/21/2019), 0.25 mg (01/18/2020), 0.25 mg (02/08/2020), 0.25 mg (02/29/2020), 0.25 mg (03/22/2020), 0.25 mg (04/11/2020) pegfilgrastim-jmdb (FULPHILA) injection 6 mg, 6 mg, Subcutaneous,  Once, 4 of 6 cycles Administration: 6 mg (03/23/2020), 6 mg (04/12/2020) PEMEtrexed (ALIMTA) 900 mg in sodium chloride 0.9 % 100 mL chemo infusion, 500 mg/m2 = 900 mg, Intravenous,  Once, 21 of 23 cycles Dose modification: 400 mg/m2 (original dose 500 mg/m2, Cycle 14, Reason: Provider Judgment, Comment: change in reanl function, although CrCl > 45 ml/min) Administration: 900 mg (02/08/2019), 900 mg (03/29/2019), 900 mg (04/18/2019), 900 mg (05/09/2019), 900 mg (05/30/2019), 900 mg (02/28/2019), 900 mg (06/27/2019), 900 mg (07/18/2019), 900 mg (08/08/2019), 900 mg (08/30/2019), 900 mg (09/20/2019), 900 mg (10/11/2019), 900 mg (11/08/2019), 700 mg (11/29/2019), 700 mg (01/18/2020), 700 mg (02/08/2020), 700 mg (02/29/2020), 700 mg (12/21/2019), 700 mg (03/22/2020), 700 mg (04/11/2020) CARBOplatin (PARAPLATIN) 500 mg in sodium chloride 0.9 % 250 mL chemo infusion, 470 mg (100 % of original dose 472 mg), Intravenous,  Once, 2 of 2 cycles Dose modification:   (original dose 472 mg, Cycle 1) Administration: 500 mg (02/08/2019), 190 mg (04/18/2019) pembrolizumab (KEYTRUDA) 200 mg in sodium chloride 0.9 % 50 mL chemo infusion, 200 mg, Intravenous, Once, 21 of 23 cycles Administration: 200 mg (02/08/2019), 200 mg (03/29/2019), 200 mg (04/18/2019), 200 mg (05/09/2019), 200 mg (05/30/2019), 200 mg (02/28/2019), 200 mg (06/27/2019), 200 mg (07/18/2019), 200 mg (08/08/2019), 200 mg (08/30/2019), 200 mg (09/20/2019), 200 mg (10/11/2019), 200 mg (  11/08/2019), 200 mg (11/29/2019), 200 mg (12/21/2019), 200 mg (01/18/2020), 200 mg (02/08/2020), 200 mg (02/29/2020), 200 mg (03/22/2020), 200 mg  (04/11/2020) fosaprepitant (EMEND) 150 mg, dexamethasone (DECADRON) 12 mg in sodium chloride 0.9 % 145 mL IVPB, , Intravenous,  Once, 4 of 4 cycles Administration:  (02/08/2019),  (04/18/2019)   for chemotherapy treatment.     07/10/2020 -  Chemotherapy    Patient is on Treatment Plan: LUNG MAINTENANCE PEMETREXED + PEMBROLIZUMAB          HISTORY OF PRESENTING ILLNESS:  Jimmy West 70 y.o.  male history of metastatic lung cancer-non-small cell-currently on maintenance Alimta-Keytruda is here for follow-up.  Patient is currently undergoing SBRT for the enlarging right lower lobe lung lesion.  Patient did not receive Alimta at last visit because of worsening renal function.  Denies any worsening shortness of breath or cough.  No fevers or chills.  No nausea no vomiting.  Review of Systems  Constitutional:  Positive for malaise/fatigue. Negative for chills, diaphoresis, fever and weight loss.  HENT:  Negative for nosebleeds and sore throat.   Eyes:  Negative for double vision.  Respiratory:  Negative for cough, hemoptysis, sputum production, shortness of breath and wheezing.   Cardiovascular:  Negative for chest pain, palpitations, orthopnea and leg swelling.  Gastrointestinal:  Negative for abdominal pain, blood in stool, constipation, diarrhea, heartburn, melena, nausea and vomiting.  Genitourinary:  Negative for dysuria, frequency and urgency.  Musculoskeletal:  Positive for back pain and joint pain.  Skin: Negative.  Negative for itching and rash.  Neurological:  Negative for dizziness, tingling, focal weakness, weakness and headaches.  Endo/Heme/Allergies:  Does not bruise/bleed easily.  Psychiatric/Behavioral:  Negative for depression. The patient is not nervous/anxious and does not have insomnia.     MEDICAL HISTORY:  Past Medical History:  Diagnosis Date  . Bulging lumbar disc   . Cancer ([place])    stage 4 lung cancer  . Heart attack [facility])     SURGICAL  HISTORY: Past Surgical History:  Procedure Laterality Date  . CARDIAC CATHETERIZATION     two stents  . KNEE SURGERY Left   . PORTA CATH INSERTION N/A 05/21/2018   Procedure: PORTA CATH INSERTION;  Surgeon: Algernon Huxley, MD;  Location: [facility];  Service: Cardiovascular;  Laterality: N/A;    SOCIAL HISTORY: Social History   Socioeconomic History  . Marital status: Single    Spouse name: Not on file  . Number of children: Not on file  . Years of education: Not on file  . Highest education level: Not on file  Occupational History  . Not on file  Tobacco Use  . Smoking status: Former    Packs/day: 1.50    Years: 15.00    Pack years: 22.50    Types: Cigarettes    Quit date: 11/03/2003    Years since quitting: 17.2  . Smokeless tobacco: Never  Vaping Use  . Vaping Use: Never used  Substance and Sexual Activity  . Alcohol use: Not Currently  . Drug use: No  . Sexual activity: Not Currently  Other Topics Concern  . Not on file  Social History Narrative  . Not on file   Social Determinants of Health   Financial Resource Strain: Not on file  Food Insecurity: Not on file  Transportation Needs: Not on file  Physical Activity: Not on file  Stress: Not on file  Social Connections: Not on file  Intimate Partner Violence: Not on file  FAMILY HISTORY: Family History  Problem Relation Age of Onset  . Heart failure Father   . Cancer Maternal Aunt   . Heart failure Maternal Uncle   . Dementia Paternal Grandmother   . Cancer Maternal Aunt     ALLERGIES:  has No Known Allergies.  MEDICATIONS:  Current Outpatient Medications  Medication Sig Dispense Refill  . albuterol (VENTOLIN HFA) 108 (90 Base) MCG/ACT inhaler Inhale 2 puffs into the lungs every 6 (six) hours as needed for wheezing or shortness of breath. 18 g 3  . ALPRAZolam (XANAX) 0.5 MG tablet Take 1 tablet (0.5 mg total) by mouth 4 (four) times daily as needed for anxiety. 60 tablet 0  . apixaban  (ELIQUIS) 5 MG TABS tablet Take 1 tablet (5 mg total) by mouth 2 (two) times daily. 180 tablet 3  . Calcium 600-400 MG-UNIT CHEW Chew 2 tablets by mouth daily.    Marland Kitchen FLUoxetine (PROZAC) 40 MG capsule Take 1 capsule (40 mg total) by mouth daily. 90 capsule 1  . folic acid (FOLVITE) 1 MG tablet Take 1 tablet (1 mg total) by mouth daily. Continue until 21 days after Alimta completed. 30 tablet 3  . OLANZapine (ZYPREXA) 5 MG tablet Take 1 tablet (5 mg total) by mouth at bedtime. 30 tablet 3  . Oxycodone HCl 20 MG TABS Take 1-1.5 tablets (20-30 mg total) by mouth every 4 (four) hours as needed. 90 tablet 0  . polyethylene glycol (MIRALAX / GLYCOLAX) packet Take 17 g by mouth daily.    . prochlorperazine (COMPAZINE) 10 MG tablet Take 1 tablet (10 mg total) by mouth every 6 (six) hours as needed for nausea or vomiting. 30 tablet 0  . senna (SENOKOT) 8.6 MG tablet Take 1 tablet by mouth as needed.     Marland Kitchen dexamethasone (DECADRON) 4 MG tablet Take 1 tab two times a day the day before Alimta chemo, then take 2 tabs once a day for 3 days starting the day after chemo. 30 tablet 1   No current facility-administered medications for this visit.   Facility-Administered Medications Ordered in Other Visits  Medication Dose Route Frequency Provider Last Rate Last Admin  . 0.9 %  sodium chloride infusion   Intravenous Once Corcoran, Melissa C, MD      . 0.9 %  sodium chloride infusion   Intravenous Once PRN Corcoran, Melissa C, MD      . albuterol (PROVENTIL) (2.5 MG/3ML) 0.083% nebulizer solution 2.5 mg  2.5 mg Nebulization Once PRN Corcoran, Melissa C, MD      . alteplase (CATHFLO ACTIVASE) injection 2 mg  2 mg Intracatheter Once PRN Lequita Asal, MD      . cyanocobalamin ((VITAMIN B-12)) injection 1,000 mcg  1,000 mcg Intramuscular Once Lloyd Huger, MD      . EPINEPHrine (ADRENALIN) 1 MG/10ML injection 0.25 mg  0.25 mg Intravenous Once PRN Corcoran, Melissa C, MD      . EPINEPHrine (ADRENALIN) 1  MG/10ML injection 0.25 mg  0.25 mg Intravenous Once PRN Corcoran, Melissa C, MD      . heparin lock flush 100 unit/mL  500 Units Intracatheter Once PRN Mike Gip, Melissa C, MD      . heparin lock flush 100 unit/mL  250 Units Intracatheter Once PRN Nolon Stalls C, MD      . heparin lock flush 100 unit/mL  500 Units Intracatheter Once PRN Lloyd Huger, MD      . sodium chloride flush (NS) 0.9 % injection 10  mL  10 mL Intravenous PRN Lequita Asal, MD   10 mL at 03/04/19 0959  . sodium chloride flush (NS) 0.9 % injection 10 mL  10 mL Intracatheter Once PRN Corcoran, Melissa C, MD      . sodium chloride flush (NS) 0.9 % injection 3 mL  3 mL Intracatheter Once PRN Lequita Asal, MD          .  PHYSICAL EXAMINATION: ECOG PERFORMANCE STATUS: 1 - Symptomatic but completely ambulatory  Vitals:   02/05/21 0855  BP: 116/76  Pulse: 85  Resp: 20  Temp: (!) 97.3 F (36.3 C)   Filed Weights   02/05/21 0855  Weight: 137 lb 2 oz (62.2 kg)    Physical Exam Constitutional:      Comments: Alone.  Ambulating independently.  HENT:     Head: Normocephalic and atraumatic.     Mouth/Throat:     Pharynx: No oropharyngeal exudate.  Eyes:     Pupils: Pupils are equal, round, and reactive to light.  Cardiovascular:     Rate and Rhythm: Normal rate and regular rhythm.  Pulmonary:     Effort: No respiratory distress.     Breath sounds: No wheezing.     Comments: Decreased breath sounds bilaterally in the bases. Abdominal:     General: Bowel sounds are normal. There is no distension.     Palpations: Abdomen is soft. There is no mass.     Tenderness: There is no abdominal tenderness. There is no guarding or rebound.  Musculoskeletal:        General: No tenderness. Normal range of motion.     Cervical back: Normal range of motion and neck supple.  Skin:    General: Skin is warm.  Neurological:     Mental Status: He is alert and oriented to person, place, and time.   Psychiatric:        Mood and Affect: Affect normal.     LABORATORY DATA:  I have reviewed the data as listed Lab Results  Component Value Date   WBC 6.3 02/05/2021   HGB 9.1 (L) 02/05/2021   HCT 29.3 (L) 02/05/2021   MCV 88.5 02/05/2021   PLT 332 02/05/2021   Recent Labs    03/12/20 1401 03/21/20 0904 04/11/20 0905 06/25/20 0851 12/21/20 0847 01/11/21 0831 02/05/21 0827  NA 137 139 137   < > 138 134* 137  K 3.5 3.6 4.3   < > 3.8 4.0 3.8  CL 105 102 104   < > 105 100 102  CO2 25 26 22    < > 25 24 26   GLUCOSE 110* 134* 169*   < > 132* 160* 105*  BUN 7* 17 9   < > 11 16 9   CREATININE 1.06 1.19 1.24   < > 1.10 1.63* 1.21  CALCIUM 8.7* 9.8 8.6*   < > 9.0 8.9 9.1  GFRNONAA >60 >60 59*   < > >60 45* >60  GFRAA >60 >60 >60  --   --   --   --   PROT  --  6.6 7.0   < > 7.0 7.0 7.0  ALBUMIN  --  3.4* 3.4*   < > 3.1* 3.3* 3.3*  AST  --  24 25   < > 21 22 18   ALT  --  13 11   < > 10 9 7   ALKPHOS  --  68 86   < > 89 97 70  BILITOT  --  0.5 0.5   < > 0.5 0.3 0.8   < > = values in this interval not displayed.    RADIOGRAPHIC STUDIES: I have personally reviewed the radiological images as listed and agreed with the findings in the report. US Venous Img Lower Unilateral Left  Result Date: 02/07/2021 CLINICAL DATA:  Left lower extremity pain and edema for the past week. History of lung cancer. Evaluate for DVT. EXAM: LEFT LOWER EXTREMITY VENOUS DOPPLER ULTRASOUND TECHNIQUE: Gray-scale sonography with graded compression, as well as color Doppler and duplex ultrasound were performed to evaluate the lower extremity deep venous systems from the level of the common femoral vein and including the common femoral, femoral, profunda femoral, popliteal and calf veins including the posterior tibial, peroneal and gastrocnemius veins when visible. The superficial great saphenous vein was also interrogated. Spectral Doppler was utilized to evaluate flow at rest and with distal augmentation maneuvers  in the common femoral, femoral and popliteal veins. COMPARISON:  None. FINDINGS: Contralateral Common Femoral Vein: Respiratory phasicity is normal and symmetric with the symptomatic side. No evidence of thrombus. Normal compressibility. Common Femoral Vein: No evidence of thrombus. Normal compressibility, respiratory phasicity and response to augmentation. Saphenofemoral Junction: No evidence of thrombus. Normal compressibility and flow on color Doppler imaging. Profunda Femoral Vein: No evidence of thrombus. Normal compressibility and flow on color Doppler imaging. Femoral Vein: No evidence of thrombus. Normal compressibility, respiratory phasicity and response to augmentation. Popliteal Vein: No evidence of thrombus. Normal compressibility, respiratory phasicity and response to augmentation. Calf Veins: No evidence of thrombus. Normal compressibility and flow on color Doppler imaging. Superficial Great Saphenous Vein: No evidence of thrombus. Normal compressibility. Venous Reflux:  None. Other Findings: Note is made of an approximately 4.5 x 1.0 x 2.7 cm serpiginous anechoic left-sided Baker's cyst. There is a minimal amount of subcutaneous edema at the level of the left ankle. IMPRESSION: 1. No evidence DVT within the lower extremity. 2. Incidental note made of an approximately 4.5 cm left-sided Baker's cyst. Electronically Signed   By: Sandi Mariscal M.D.   On: 02/07/2021 12:05    ASSESSMENT & PLAN:   Primary cancer of right lower lobe of lung [facility]) # Metastatic lung cancer-on keytruda+Alimta maintenance; PET scan June 2022-progressive right lower lobe hypermetabolic mass; no evidence of any distant metastatic disease or mediastinal adenopathy or osseous metastases.   # HOLD Beryle Flock [today- given SBRT-given the ongoing concern for pneumonitis while on immunotherapy/radiation].  Proceed with Alimta; patient will need growth factor Support.  # Increasing size of the right upper lobe lung nodule currently  undergoing SBRT [July 26th- last].  Tolerating well.  #CKD stage III-GFR-GFR 60.  Proceed with Alimta.  #Back pain/bone mets-continue current oxycodone; Zometa every 6 weeks-?-Zometa today.  Calcium normal stable  # Anxiety and depression: Stable;  Xanax as well as Prozac           #  Pulmonary embolism:  Continue Eliquis.  # Chemotherapy induced anemia: on retacrit hemoglobin around 9.1 proceed with Retacrit.            # DISPOSITION: # ALimta today; Meyer Russel; Retacrit today # follow up in 3 weeks- MD (CBC with diff, CMP, Mg), Alimta and pembrolizumab; retacrit;  D-2 Ellen Henri- Dr.B         All questions were answered. The patient knows to call the clinic with any problems, questions or concerns.       Cammie Sickle, MD 02/10/2021 6:35 PM

## 2021-02-06 ENCOUNTER — Telehealth: Payer: Self-pay | Admitting: *Deleted

## 2021-02-06 ENCOUNTER — Ambulatory Visit
Admission: RE | Admit: 2021-02-06 | Discharge: 2021-02-06 | Disposition: A | Discharge status: Home / Self Care | Payer: Medicare HMO | Source: Ambulatory Visit | Attending: Radiation Oncology | Admitting: Radiation Oncology

## 2021-02-06 DIAGNOSIS — Z87891 Personal history of nicotine dependence: Secondary | ICD-10-CM | POA: Diagnosis not present

## 2021-02-06 DIAGNOSIS — C7951 Secondary malignant neoplasm of bone: Secondary | ICD-10-CM | POA: Diagnosis not present

## 2021-02-06 DIAGNOSIS — C3431 Malignant neoplasm of lower lobe, right bronchus or lung: Secondary | ICD-10-CM | POA: Diagnosis not present

## 2021-02-06 NOTE — Telephone Encounter (Signed)
Pt needs to be evaluated in [facility]

## 2021-02-06 NOTE — Telephone Encounter (Signed)
Pt will see Josh tomorrow after his radiation apt. He stated that his udencya injection is schedule in [place] tomorrow prior to radiation. He prefers that the injection be in [place]. I spoke with Abbie in scheduling and she will move this apt to [place] at the same time.

## 2021-02-06 NOTE — Telephone Encounter (Signed)
Patient reports swelling in his left leg and ankle of 3 week duration, he stated there is an area on his calf that is painful to touch.

## 2021-02-07 ENCOUNTER — Other Ambulatory Visit: Payer: Self-pay

## 2021-02-07 ENCOUNTER — Ambulatory Visit
Admission: RE | Admit: 2021-02-07 | Discharge: 2021-02-07 | Disposition: A | Discharge status: Home / Self Care | Payer: Medicare HMO | Source: Ambulatory Visit | Attending: Radiation Oncology | Admitting: Radiation Oncology

## 2021-02-07 ENCOUNTER — Inpatient Hospital Stay: Payer: Medicare HMO

## 2021-02-07 ENCOUNTER — Ambulatory Visit
Admission: RE | Admit: 2021-02-07 | Discharge: 2021-02-07 | Disposition: A | Discharge status: Home / Self Care | Payer: Medicare HMO | Source: Ambulatory Visit | Attending: Hospice and Palliative Medicine | Admitting: Hospice and Palliative Medicine

## 2021-02-07 ENCOUNTER — Inpatient Hospital Stay: Payer: Medicare HMO | Attending: Hospice and Palliative Medicine | Admitting: Hospice and Palliative Medicine

## 2021-02-07 VITALS — BP 120/74 | HR 80 | Temp 97.9°F | Resp 18

## 2021-02-07 DIAGNOSIS — R600 Localized edema: Secondary | ICD-10-CM

## 2021-02-07 DIAGNOSIS — Z79899 Other long term (current) drug therapy: Secondary | ICD-10-CM | POA: Diagnosis not present

## 2021-02-07 DIAGNOSIS — Z87891 Personal history of nicotine dependence: Secondary | ICD-10-CM | POA: Diagnosis not present

## 2021-02-07 DIAGNOSIS — C7951 Secondary malignant neoplasm of bone: Secondary | ICD-10-CM | POA: Diagnosis not present

## 2021-02-07 DIAGNOSIS — C3431 Malignant neoplasm of lower lobe, right bronchus or lung: Secondary | ICD-10-CM | POA: Insufficient documentation

## 2021-02-07 DIAGNOSIS — D6481 Anemia due to antineoplastic chemotherapy: Secondary | ICD-10-CM | POA: Insufficient documentation

## 2021-02-07 DIAGNOSIS — R609 Edema, unspecified: Secondary | ICD-10-CM | POA: Diagnosis not present

## 2021-02-07 DIAGNOSIS — M7122 Synovial cyst of popliteal space [Baker], left knee: Secondary | ICD-10-CM | POA: Diagnosis not present

## 2021-02-07 DIAGNOSIS — Z85118 Personal history of other malignant neoplasm of bronchus and lung: Secondary | ICD-10-CM | POA: Diagnosis not present

## 2021-02-07 MED ORDER — PEGFILGRASTIM-CBQV 6 MG/0.6ML ~~LOC~~ SOSY
6.0000 mg | PREFILLED_SYRINGE | Freq: Once | SUBCUTANEOUS | Status: AC
  Administered 2021-02-07: 6 mg via SUBCUTANEOUS
  Filled 2021-02-07: qty 0.6

## 2021-02-07 NOTE — Progress Notes (Signed)
Symptom Management [facility]  [phone] [phone]  Patient Care Team: Default, Provider, MD as PCP - General Telford Nab, RN as Registered Nurse Burlene Arnt, Gerda Diss, MD as Attending Physician (Emergency Medicine) Lebron Conners, [place] (Physician Assistant) Wonda Horner, MD as Referring Physician (Neurosurgery) Jason Coop, NP as Nurse Practitioner Cammie Sickle, MD as Consulting Physician (Hematology and Oncology)   Name of the patient: Jimmy West  [id number]  1950-12-20   Date of visit: 02/07/21  Reason for Consult:  Jimmy West is a 70 year old male with multiple medical problems including 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  Patient presents to [facility] today for evaluation of 1 week of left pedal/ankle edema and left calf pain.  He reports that edema has been persistent over the past week.  He has occasional left calf pain and pain at the medial malleolus but denies trauma or falls.  No bruising or erythema reported.  No fever or chills.  No chest pain or shortness of breath.  He continues to take Eliquis daily as directed.  Denies any neurologic complaints. Denies recent fevers or illnesses. Denies any easy bleeding or bruising. Reports good appetite and denies weight loss. Denies chest pain. Denies any nausea, vomiting, constipation, or diarrhea. Denies urinary complaints. Patient offers no further specific complaints today.  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   *INTOLERANT to carboplatin at an AUC of 5 as well as 2. # NGS/MOLECULAR TESTS:Foundation One revealed no driver mutations  # PALLIATIVE CARE EVALUATION:  # PAIN MANAGEMENT:    DIAGNOSIS:   STAGE:         ;  GOALS:  CURRENT/MOST RECENT THERAPY :     Primary cancer of right lower lobe of lung ([place])  04/26/2018 Initial Diagnosis   Primary cancer of right lower lobe of lung ([place])    05/24/2018 - 01/10/2019 Chemotherapy   The patient had dexamethasone (DECADRON) 4 MG tablet, 1 of 1 cycle, Start date: 05/17/2018, End date: 08/12/2018 pembrolizumab (KEYTRUDA) 200 mg in sodium chloride 0.9 % 50 mL chemo infusion, 200 mg, Intravenous, Once, 11 of 14 cycles Administration: 200 mg (05/24/2018), 200 mg (07/16/2018), 200 mg (08/09/2018), 200 mg (08/30/2018), 200 mg (09/20/2018), 200 mg (06/14/2018), 200 mg (10/11/2018), 200 mg (11/08/2018), 200 mg (11/29/2018), 200 mg (12/20/2018), 200 mg (01/10/2019)   for chemotherapy treatment.     02/08/2019 - 04/12/2020 Chemotherapy   The patient had dexamethasone (DECADRON) 4 MG tablet, 1 of 1 cycle, Start date: 04/23/2020, End date: -- palonosetron (ALOXI) injection 0.25 mg, 0.25 mg, Intravenous,  Once, 21 of 23 cycles Administration: 0.25 mg (02/08/2019), 0.25 mg (03/29/2019), 0.25 mg (04/18/2019), 0.25 mg (02/28/2019), 0.25 mg (05/09/2019), 0.25 mg (05/30/2019), 0.25 mg (06/27/2019), 0.25 mg (07/18/2019), 0.25 mg (  08/08/2019), 0.25 mg (08/30/2019), 0.25 mg (09/20/2019), 0.25 mg (10/11/2019), 0.25 mg (11/08/2019), 0.25 mg (11/29/2019), 0.25 mg (12/21/2019), 0.25 mg (01/18/2020), 0.25 mg (02/08/2020), 0.25 mg (02/29/2020), 0.25 mg (03/22/2020), 0.25 mg (04/11/2020) pegfilgrastim-jmdb (FULPHILA) injection 6 mg, 6 mg, Subcutaneous,  Once, 4 of 6  cycles Administration: 6 mg (03/23/2020), 6 mg (04/12/2020) PEMEtrexed (ALIMTA) 900 mg in sodium chloride 0.9 % 100 mL chemo infusion, 500 mg/m2 = 900 mg, Intravenous,  Once, 21 of 23 cycles Dose modification: 400 mg/m2 (original dose 500 mg/m2, Cycle 14, Reason: Provider Judgment, Comment: change in reanl function, although CrCl > 45 ml/min) Administration: 900 mg (02/08/2019), 900 mg (03/29/2019), 900 mg (04/18/2019), 900 mg (05/09/2019), 900 mg (05/30/2019), 900 mg (02/28/2019), 900 mg (06/27/2019), 900 mg (07/18/2019), 900 mg (08/08/2019), 900 mg (08/30/2019), 900 mg (09/20/2019), 900 mg (10/11/2019), 900 mg (11/08/2019), 700 mg (11/29/2019), 700 mg (01/18/2020), 700 mg (02/08/2020), 700 mg (02/29/2020), 700 mg (12/21/2019), 700 mg (03/22/2020), 700 mg (04/11/2020) CARBOplatin (PARAPLATIN) 500 mg in sodium chloride 0.9 % 250 mL chemo infusion, 470 mg (100 % of original dose 472 mg), Intravenous,  Once, 2 of 2 cycles Dose modification:   (original dose 472 mg, Cycle 1) Administration: 500 mg (02/08/2019), 190 mg (04/18/2019) pembrolizumab (KEYTRUDA) 200 mg in sodium chloride 0.9 % 50 mL chemo infusion, 200 mg, Intravenous, Once, 21 of 23 cycles Administration: 200 mg (02/08/2019), 200 mg (03/29/2019), 200 mg (04/18/2019), 200 mg (05/09/2019), 200 mg (05/30/2019), 200 mg (02/28/2019), 200 mg (06/27/2019), 200 mg (07/18/2019), 200 mg (08/08/2019), 200 mg (08/30/2019), 200 mg (09/20/2019), 200 mg (10/11/2019), 200 mg (11/08/2019), 200 mg (11/29/2019), 200 mg (12/21/2019), 200 mg (01/18/2020), 200 mg (02/08/2020), 200 mg (02/29/2020), 200 mg (03/22/2020), 200 mg (04/11/2020) fosaprepitant (EMEND) 150 mg, dexamethasone (DECADRON) 12 mg in sodium chloride 0.9 % 145 mL IVPB, , Intravenous,  Once, 4 of 4 cycles Administration:  (02/08/2019),  (04/18/2019)   for chemotherapy treatment.     07/10/2020 -  Chemotherapy    Patient is on Treatment Plan: LUNG MAINTENANCE PEMETREXED + PEMBROLIZUMAB         ALLERGIES:  has No Known Allergies.  MEDICATIONS:   Current Outpatient Medications  Medication Sig Dispense Refill   albuterol (VENTOLIN HFA) 108 (90 Base) MCG/ACT inhaler Inhale 2 puffs into the lungs every 6 (six) hours as needed for wheezing or shortness of breath. 18 g 3   ALPRAZolam (XANAX) 0.5 MG tablet Take 1 tablet (0.5 mg total) by mouth 4 (four) times daily as needed for anxiety. 60 tablet 0   apixaban (ELIQUIS) 5 MG TABS tablet Take 1 tablet (5 mg total) by mouth 2 (two) times daily. 180 tablet 3   Calcium 600-400 MG-UNIT CHEW Chew 2 tablets by mouth daily.     dexamethasone (DECADRON) 4 MG tablet Take 1 tab two times a day the day before Alimta chemo, then take 2 tabs once a day for 3 days starting the day after chemo. 30 tablet 1   FLUoxetine (PROZAC) 40 MG capsule Take 1 capsule (40 mg total) by mouth daily. 90 capsule 1   folic acid (FOLVITE) 1 MG tablet Take 1 tablet (1 mg total) by mouth daily. Continue until 21 days after Alimta completed. 30 tablet 3   OLANZapine (ZYPREXA) 5 MG tablet Take 1 tablet (5 mg total) by mouth at bedtime. 30 tablet 3   Oxycodone HCl 20 MG TABS Take 1-1.5 tablets (20-30 mg total) by mouth every 4 (four) hours as needed. 90 tablet 0   polyethylene glycol (MIRALAX /  GLYCOLAX) packet Take 17 g by mouth daily.     prochlorperazine (COMPAZINE) 10 MG tablet Take 1 tablet (10 mg total) by mouth every 6 (six) hours as needed for nausea or vomiting. 30 tablet 0   senna (SENOKOT) 8.6 MG tablet Take 1 tablet by mouth as needed.      No current facility-administered medications for this visit.   Facility-Administered Medications Ordered in Other Visits  Medication Dose Route Frequency Provider Last Rate Last Admin   0.9 %  sodium chloride infusion   Intravenous Once Corcoran, Melissa C, MD       0.9 %  sodium chloride infusion   Intravenous Once PRN Corcoran, Melissa C, MD       albuterol (PROVENTIL) (2.5 MG/3ML) 0.083% nebulizer solution 2.5 mg  2.5 mg Nebulization Once PRN Corcoran, Melissa C, MD        alteplase (CATHFLO ACTIVASE) injection 2 mg  2 mg Intracatheter Once PRN Lequita Asal, MD       cyanocobalamin ((VITAMIN B-12)) injection 1,000 mcg  1,000 mcg Intramuscular Once Grayland Ormond, Kathlene November, MD       EPINEPHrine (ADRENALIN) 1 MG/10ML injection 0.25 mg  0.25 mg Intravenous Once PRN Corcoran, Melissa C, MD       EPINEPHrine (ADRENALIN) 1 MG/10ML injection 0.25 mg  0.25 mg Intravenous Once PRN Corcoran, Melissa C, MD       heparin lock flush 100 unit/mL  500 Units Intracatheter Once PRN Lequita Asal, MD       heparin lock flush 100 unit/mL  250 Units Intracatheter Once PRN Lequita Asal, MD       heparin lock flush 100 unit/mL  500 Units Intracatheter Once PRN Lloyd Huger, MD       sodium chloride flush (NS) 0.9 % injection 10 mL  10 mL Intravenous PRN Nolon Stalls C, MD   10 mL at 03/04/19 0959   sodium chloride flush (NS) 0.9 % injection 10 mL  10 mL Intracatheter Once PRN Lequita Asal, MD       sodium chloride flush (NS) 0.9 % injection 3 mL  3 mL Intracatheter Once PRN Lequita Asal, MD        VITAL SIGNS: BP 120/74   Pulse 80   Temp 97.9 F (36.6 C) (Tympanic)   Resp 18   SpO2 97%  There were no vitals filed for this visit.  Estimated body mass index is 22.13 kg/m as calculated from the following:   Height as of 02/05/21: 5\' 6"  (1.676 m).   Weight as of 02/05/21: 137 lb 2 oz (62.2 kg).  LABS: CBC:    Component Value Date/Time   WBC 6.3 02/05/2021 0827   HGB 9.1 (L) 02/05/2021 0827   HGB 14.9 04/06/2014 0826   HCT 29.3 (L) 02/05/2021 0827   HCT 44.8 04/06/2014 0826   PLT 332 02/05/2021 0827   PLT 238 04/06/2014 0826   MCV 88.5 02/05/2021 0827   MCV 95 04/06/2014 0826   NEUTROABS 4.1 02/05/2021 0827   NEUTROABS 3.9 04/06/2014 0826   LYMPHSABS 0.6 (L) 02/05/2021 0827   LYMPHSABS 1.1 04/06/2014 0826   MONOABS 1.0 02/05/2021 0827   MONOABS 0.7 04/06/2014 0826   EOSABS 0.5 02/05/2021 0827   EOSABS 0.1 04/06/2014 0826    BASOSABS 0.1 02/05/2021 0827   BASOSABS 0.1 04/06/2014 0826   Comprehensive Metabolic Panel:    Component Value Date/Time   NA 137 02/05/2021 0827   NA 140 04/06/2014 0826  K 3.8 02/05/2021 0827   K 4.0 04/06/2014 0826   CL 102 02/05/2021 0827   CL 108 (H) 04/06/2014 0826   CO2 26 02/05/2021 0827   CO2 25 04/06/2014 0826   BUN 9 02/05/2021 0827   BUN 12 04/06/2014 0826   CREATININE 1.21 02/05/2021 0827   CREATININE 1.17 04/06/2014 0826   GLUCOSE 105 (H) 02/05/2021 0827   GLUCOSE 106 (H) 04/06/2014 0826   CALCIUM 9.1 02/05/2021 0827   CALCIUM 8.9 04/06/2014 0826   AST 18 02/05/2021 0827   AST 24 04/06/2014 0826   ALT 7 02/05/2021 0827   ALT 33 04/06/2014 0826   ALKPHOS 70 02/05/2021 0827   ALKPHOS 85 04/06/2014 0826   BILITOT 0.8 02/05/2021 0827   BILITOT 0.8 04/06/2014 0826   PROT 7.0 02/05/2021 0827   PROT 7.3 04/06/2014 0826   ALBUMIN 3.3 (L) 02/05/2021 0827   ALBUMIN 3.8 04/06/2014 0826    RADIOGRAPHIC STUDIES: No results found.  PERFORMANCE STATUS (ECOG) : 1 - Symptomatic but completely ambulatory  Review of Systems Unless otherwise noted, a complete review of systems is negative.  Physical Exam General: NAD Cardiovascular: regular rate and rhythm Pulmonary: clear ant fields Abdomen: soft, nontender, + bowel sounds GU: no suprapubic tenderness Extremities: LLE  edema, no joint deformities Skin: no rashes Neurological: Weakness but otherwise nonfocal  Assessment and Plan- Patient is a 70 y.o. male stage IV adenocarcinoma of the lung and history of PE on Eliquis who presents to [facility] for evaluation of left lower extremity edema/pain.  Edema -patient does appear to have unilateral left lower extremity edema but he currently denies pain with negative Bevelyn Buckles' sign.  Patient says his edema is actually somewhat improved today.  Will send for lower extremity Doppler to evaluate for DVT.  Continue Eliquis for now.  Case and plan discussed with Dr. Rogue Bussing     Patient expressed understanding and was in agreement with this plan. He also understands that He can call clinic at any time with any questions, concerns, or complaints.   Thank you for allowing me to participate in the care of this very pleasant patient.   Time Total: 20 minutes  Visit consisted of counseling and education dealing with the complex and emotionally intense issues of symptom management and palliative care in the setting of serious and potentially life-threatening illness.Greater than 50%  of this time was spent counseling and coordinating care related to the above assessment and plan.  Signed by: [name], PhD, NP-C

## 2021-02-07 NOTE — Progress Notes (Signed)
Pt presents to [facility] for left ankle swelling and left calf pain. Describes pain as mild and intermittent. States that symptoms have been present for about a week and a half. +1 pitting edema noted to left ankle. No redness or swelling observed to calf.

## 2021-02-08 ENCOUNTER — Ambulatory Visit
Admission: RE | Admit: 2021-02-08 | Discharge: 2021-02-08 | Disposition: A | Discharge status: Home / Self Care | Payer: Medicare HMO | Source: Ambulatory Visit | Attending: Radiation Oncology | Admitting: Radiation Oncology

## 2021-02-08 DIAGNOSIS — C7951 Secondary malignant neoplasm of bone: Secondary | ICD-10-CM | POA: Diagnosis not present

## 2021-02-08 DIAGNOSIS — C3431 Malignant neoplasm of lower lobe, right bronchus or lung: Secondary | ICD-10-CM | POA: Diagnosis not present

## 2021-02-08 DIAGNOSIS — Z87891 Personal history of nicotine dependence: Secondary | ICD-10-CM | POA: Diagnosis not present

## 2021-02-10 ENCOUNTER — Encounter: Payer: Self-pay | Admitting: Hematology and Oncology

## 2021-02-11 ENCOUNTER — Ambulatory Visit: Payer: Medicare HMO

## 2021-02-11 ENCOUNTER — Ambulatory Visit
Admission: RE | Admit: 2021-02-11 | Discharge: 2021-02-11 | Disposition: A | Discharge status: Home / Self Care | Payer: Medicare HMO | Source: Ambulatory Visit | Attending: Radiation Oncology | Admitting: Radiation Oncology

## 2021-02-11 ENCOUNTER — Other Ambulatory Visit: Payer: Self-pay | Admitting: *Deleted

## 2021-02-11 DIAGNOSIS — C7951 Secondary malignant neoplasm of bone: Secondary | ICD-10-CM | POA: Diagnosis not present

## 2021-02-11 DIAGNOSIS — C3431 Malignant neoplasm of lower lobe, right bronchus or lung: Secondary | ICD-10-CM | POA: Diagnosis not present

## 2021-02-11 DIAGNOSIS — Z87891 Personal history of nicotine dependence: Secondary | ICD-10-CM | POA: Diagnosis not present

## 2021-02-11 DIAGNOSIS — F419 Anxiety disorder, unspecified: Secondary | ICD-10-CM

## 2021-02-11 DIAGNOSIS — G893 Neoplasm related pain (acute) (chronic): Secondary | ICD-10-CM

## 2021-02-11 MED ORDER — OXYCODONE HCL 20 MG PO TABS: 20.0000 mg | ORAL_TABLET | ORAL | 0 refills | Status: DC | PRN

## 2021-02-11 MED ORDER — ALPRAZOLAM 0.5 MG PO TABS: 0.5000 mg | ORAL_TABLET | Freq: Four times a day (QID) | ORAL | 0 refills | Status: DC | PRN

## 2021-02-12 ENCOUNTER — Telehealth: Payer: Self-pay | Admitting: Primary Care

## 2021-02-12 ENCOUNTER — Ambulatory Visit
Admission: RE | Admit: 2021-02-12 | Discharge: 2021-02-12 | Disposition: A | Discharge status: Home / Self Care | Payer: Medicare HMO | Source: Ambulatory Visit | Attending: Radiation Oncology | Admitting: Radiation Oncology

## 2021-02-12 ENCOUNTER — Ambulatory Visit: Payer: Medicare HMO

## 2021-02-12 DIAGNOSIS — Z87891 Personal history of nicotine dependence: Secondary | ICD-10-CM | POA: Diagnosis not present

## 2021-02-12 DIAGNOSIS — C3431 Malignant neoplasm of lower lobe, right bronchus or lung: Secondary | ICD-10-CM | POA: Diagnosis not present

## 2021-02-12 DIAGNOSIS — C7951 Secondary malignant neoplasm of bone: Secondary | ICD-10-CM | POA: Diagnosis not present

## 2021-02-12 NOTE — Telephone Encounter (Signed)
T/c to patient to schedule palliative appointment. Message left.

## 2021-02-19 ENCOUNTER — Telehealth: Payer: Self-pay | Admitting: Internal Medicine

## 2021-02-19 ENCOUNTER — Encounter: Payer: Self-pay | Admitting: Internal Medicine

## 2021-02-19 NOTE — Telephone Encounter (Signed)
Called patient and left patient voicemail to return call regarding discussion of eligibility with medication assistance support while under active treatment.

## 2021-02-19 NOTE — Progress Notes (Signed)
Telephone conversation with patient based upon contact from patient.  Discussed one-time $1000 [place] and qualifications to assist with personal expenses while undergoing treatment.  Patient to complete [place] agreement and provide necessary information at next appt with provider on Aug 09.   Provided patient my contact information for any additional questions.   During conversation patient expressed additional concerns related to financial and diagnosis.  Message to L-CSW for review and follow up.

## 2021-02-21 ENCOUNTER — Encounter: Payer: Self-pay | Admitting: *Deleted

## 2021-02-21 ENCOUNTER — Other Ambulatory Visit: Payer: Self-pay | Admitting: *Deleted

## 2021-02-21 DIAGNOSIS — F419 Anxiety disorder, unspecified: Secondary | ICD-10-CM

## 2021-02-21 DIAGNOSIS — G893 Neoplasm related pain (acute) (chronic): Secondary | ICD-10-CM

## 2021-02-21 MED ORDER — FLUOXETINE HCL 40 MG PO CAPS: 40.0000 mg | ORAL_CAPSULE | Freq: Every day | ORAL | 1 refills | Status: DC

## 2021-02-21 NOTE — Telephone Encounter (Signed)
Pt requesting refill of xanax and oxycodone which is managed by [facility]

## 2021-02-22 ENCOUNTER — Encounter: Payer: Self-pay | Admitting: Hematology and Oncology

## 2021-02-22 NOTE — Progress Notes (Signed)
[place] Clinical Social Work  Initial Assessment   Jimmy West is a 70 y.o. year old male contacted by phone. Clinical Social Work was referred by Ulice Dash, financial team, for assessment of psychosocial needs.   SDOH (Social Determinants of Health) assessments performed: Yes   Distress Screen completed: No - not recent assessment ONCBCN DISTRESS SCREENING 04/30/2018  Screening Type Initial Screening  Distress experienced in past week (1-10) 2    Family/Social Information:  Housing Arrangement: patient lives alone Family members/support persons in your life? Minimal support identified. Transportation concerns: no  Employment: Disabled. Income source: Theatre stage manager Income Financial concerns: Yes, due to illness and/or loss of work during treatment Type of concern: Utilities, Rent/ mortgage, Medical bills, Food, and Medications Food access concerns: yes, patient states this is not a primary concern as he has poor appetite. He shared he is most concerned with cost of cat food for his animals. Religious or spiritual practice: Did not discuss during today's phone call. Medication Concerns: yes, cost of medication copays  Services Currently in place:  None at this time.  Coping/ Adjustment to diagnosis: Patient understands treatment plan and what happens next? yes, patient has been in long-term treatment Concerns about diagnosis and/or treatment: How I will pay for the services I need and daily living expenses Patient reported stressors: Housing, Insurance underwriter, Publishing rights manager, Transport planner, Haematologist, and Isolation/ feeling alone Hopes and priorities: Did not discuss during today's phone visit. CSW and patient primarily discussed resources, briefly discussed managing a chronic illness and emotional implications Patient enjoys  spending time with his cats Current coping skills/ strengths: Ability for insight, Capable of independent living, and Communication skills    SUMMARY: Current  SDOH Barriers:  Financial constraints related to limited income, Limited social support, Limited access to food, and Housing barriers  Clinical Social Work Clinical Goal(s):  patient will work with SW to address concerns related to financial barriers-discuss community resources patient will work with financial team to address needs related to financial barriersOptician, dispensing and medication assistance  Interventions: Discussed common feeling and emotions when being diagnosed with cancer, and the importance of support during treatment Informed patient of the support team roles and support services at [facility] Provided [place] contact information and encouraged patient to call with any questions or concerns Provided patient with information about local and national resources   Follow Up Plan: CSW and patient discussed applying for various financial assistance organizations. Patient and CSW agreed to "take it one step at a time".  Below are initial resources discussed. CSW will follow up with patient by phone in 1-2 weeks.  -Medicaid application or Medication Assistance Programs (collaborate with Ulice Dash)  -Food Stamps  -Patient utilize [place] card(monthly)  -Cancer Care assistance program  -[place] Lung [facility] Initiative Patient verbalizes understanding of plan: Yes   Kennith [place] , LCSW

## 2021-02-23 ENCOUNTER — Encounter: Payer: Self-pay | Admitting: Hematology and Oncology

## 2021-02-25 ENCOUNTER — Other Ambulatory Visit: Payer: Self-pay | Admitting: *Deleted

## 2021-02-25 DIAGNOSIS — F419 Anxiety disorder, unspecified: Secondary | ICD-10-CM

## 2021-02-25 DIAGNOSIS — G893 Neoplasm related pain (acute) (chronic): Secondary | ICD-10-CM

## 2021-02-25 MED ORDER — OXYCODONE HCL 20 MG PO TABS: 20.0000 mg | ORAL_TABLET | ORAL | 0 refills | Status: DC | PRN

## 2021-02-25 MED ORDER — ALPRAZOLAM 0.5 MG PO TABS: 0.5000 mg | ORAL_TABLET | Freq: Four times a day (QID) | ORAL | 0 refills | Status: DC | PRN

## 2021-02-26 ENCOUNTER — Inpatient Hospital Stay: Payer: Medicare HMO | Attending: Internal Medicine

## 2021-02-26 ENCOUNTER — Inpatient Hospital Stay (HOSPITAL_BASED_OUTPATIENT_CLINIC_OR_DEPARTMENT_OTHER): Payer: Medicare HMO | Admitting: Internal Medicine

## 2021-02-26 ENCOUNTER — Other Ambulatory Visit: Payer: Self-pay

## 2021-02-26 ENCOUNTER — Encounter: Payer: Self-pay | Admitting: Internal Medicine

## 2021-02-26 ENCOUNTER — Inpatient Hospital Stay: Payer: Medicare HMO

## 2021-02-26 DIAGNOSIS — C3431 Malignant neoplasm of lower lobe, right bronchus or lung: Secondary | ICD-10-CM | POA: Diagnosis not present

## 2021-02-26 DIAGNOSIS — Z5111 Encounter for antineoplastic chemotherapy: Secondary | ICD-10-CM | POA: Insufficient documentation

## 2021-02-26 DIAGNOSIS — C7951 Secondary malignant neoplasm of bone: Secondary | ICD-10-CM

## 2021-02-26 DIAGNOSIS — D6481 Anemia due to antineoplastic chemotherapy: Secondary | ICD-10-CM | POA: Diagnosis not present

## 2021-02-26 DIAGNOSIS — Z86711 Personal history of pulmonary embolism: Secondary | ICD-10-CM | POA: Insufficient documentation

## 2021-02-26 DIAGNOSIS — N1831 Chronic kidney disease, stage 3a: Secondary | ICD-10-CM | POA: Diagnosis not present

## 2021-02-26 DIAGNOSIS — F419 Anxiety disorder, unspecified: Secondary | ICD-10-CM | POA: Diagnosis not present

## 2021-02-26 DIAGNOSIS — F32A Depression, unspecified: Secondary | ICD-10-CM | POA: Insufficient documentation

## 2021-02-26 DIAGNOSIS — R69 Illness, unspecified: Secondary | ICD-10-CM | POA: Diagnosis not present

## 2021-02-26 DIAGNOSIS — Z79899 Other long term (current) drug therapy: Secondary | ICD-10-CM | POA: Diagnosis not present

## 2021-02-26 DIAGNOSIS — Z7901 Long term (current) use of anticoagulants: Secondary | ICD-10-CM | POA: Insufficient documentation

## 2021-02-26 LAB — COMPREHENSIVE METABOLIC PANEL
ALT: 7 U/L (ref 0–44)
AST: 18 U/L (ref 15–41)
Albumin: 3 g/dL — ABNORMAL LOW (ref 3.5–5.0)
Alkaline Phosphatase: 83 U/L (ref 38–126)
Anion gap: 7 (ref 5–15)
BUN: 16 mg/dL (ref 8–23)
CO2: 26 mmol/L (ref 22–32)
Calcium: 8.8 mg/dL — ABNORMAL LOW (ref 8.9–10.3)
Chloride: 103 mmol/L (ref 98–111)
Creatinine, Ser: 1.17 mg/dL (ref 0.61–1.24)
GFR, Estimated: >60 mL/min (ref >60)
Glucose, Bld: 128 mg/dL — ABNORMAL HIGH (ref 70–99)
Potassium: 4 mmol/L (ref 3.5–5.1)
Sodium: 136 mmol/L (ref 135–145)
Total Bilirubin: 0.4 mg/dL (ref 0.3–1.2)
Total Protein: 6.7 g/dL (ref 6.5–8.1)

## 2021-02-26 LAB — CBC WITH DIFFERENTIAL/PLATELET
Abs Immature Granulocytes: 0.2 10*3/uL — ABNORMAL HIGH (ref 0.00–0.07)
Basophils Absolute: 0 10*3/uL (ref 0.0–0.1)
Basophils Relative: 0 %
Eosinophils Absolute: 0 10*3/uL (ref 0.0–0.5)
Eosinophils Relative: 0 %
HCT: 25.6 % — ABNORMAL LOW (ref 39.0–52.0)
Hemoglobin: 7.9 g/dL — ABNORMAL LOW (ref 13.0–17.0)
Immature Granulocytes: 2 %
Lymphocytes Relative: 4 %
Lymphs Abs: 0.5 10*3/uL — ABNORMAL LOW (ref 0.7–4.0)
MCH: 27.2 pg (ref 26.0–34.0)
MCHC: 30.9 g/dL (ref 30.0–36.0)
MCV: 88.3 fL (ref 80.0–100.0)
Monocytes Absolute: 1.2 10*3/uL — ABNORMAL HIGH (ref 0.1–1.0)
Monocytes Relative: 9 %
Neutro Abs: 11.2 10*3/uL — ABNORMAL HIGH (ref 1.7–7.7)
Neutrophils Relative %: 85 %
Platelets: 349 10*3/uL (ref 150–400)
RBC: 2.9 MIL/uL — ABNORMAL LOW (ref 4.22–5.81)
RDW: 17.8 % — ABNORMAL HIGH (ref 11.5–15.5)
WBC: 13.1 10*3/uL — ABNORMAL HIGH (ref 4.0–10.5)
nRBC: 0 % (ref 0.0–0.2)

## 2021-02-26 LAB — MAGNESIUM: Magnesium: 1.7 mg/dL (ref 1.7–2.4)

## 2021-02-26 MED ORDER — SODIUM CHLORIDE 0.9 % IV SOLN
200.0000 mg | Freq: Once | INTRAVENOUS | Status: AC
  Administered 2021-02-26: 200 mg via INTRAVENOUS
  Filled 2021-02-26: qty 8

## 2021-02-26 MED ORDER — SODIUM CHLORIDE 0.9 % IV SOLN
Freq: Once | INTRAVENOUS | Status: AC
  Filled 2021-02-26: qty 250

## 2021-02-26 MED ORDER — EPOETIN ALFA-EPBX 10000 UNIT/ML IJ SOLN
10000.0000 [IU] | Freq: Once | INTRAMUSCULAR | Status: AC
  Administered 2021-02-26: 10000 [IU] via SUBCUTANEOUS
  Filled 2021-02-26: qty 1

## 2021-02-26 MED ORDER — CYANOCOBALAMIN 1000 MCG/ML IJ SOLN
1000.0000 ug | Freq: Once | INTRAMUSCULAR | Status: AC
  Administered 2021-02-26: 1000 ug via INTRAMUSCULAR
  Filled 2021-02-26: qty 1

## 2021-02-26 MED ORDER — HEPARIN SOD (PORK) LOCK FLUSH 100 UNIT/ML IV SOLN
500.0000 [IU] | Freq: Once | INTRAVENOUS | Status: AC | PRN
  Administered 2021-02-26: 500 [IU]
  Filled 2021-02-26: qty 5

## 2021-02-26 MED ORDER — SODIUM CHLORIDE 0.9 % IV SOLN
700.0000 mg | Freq: Once | INTRAVENOUS | Status: AC
  Administered 2021-02-26: 700 mg via INTRAVENOUS
  Filled 2021-02-26: qty 20

## 2021-02-26 NOTE — Assessment & Plan Note (Addendum)
#   Metastatic lung cancer-on keytruda+Alimta maintenance; PET scan June 2022-progressive right lower lobe hypermetabolic mass; no evidence of any distant metastatic disease or mediastinal adenopathy or osseous metastases.   # Proceed with Keytruda+ alimta. Labs today reviewed;  acceptable for treatment today.   patient will need Reatcrit/Udenyca D-2.  Hb 7.4.   # Increasing size of the right upper lobe lung nodule currently undergoing SBRT [July 26th- last].  Discussed that we will plan to get a CT scan number 3 months posttreatment.  #CKD stage III-GFR-GFR 60.  Stable.  #Back pain/bone mets-continue current oxycodone; Zometa every 6 weeks-?-Zometa today.  Calcium normal stable  #Intermittent difficulty swallowing-not any worse.  Discussed possibility of radiation-induced esophagitis or stricture from initial radiation[ but not from recent SBRT.]-If worse would recommend evaluation with GI/EGD/stricture dilation.  #Anxiety and depression: Stable;  Xanax as well as Prozac           #Pulmonary embolism: Continue Eliquis.  Stable.  # Chemotherapy induced anemia: on retacrit hemoglobin around 7.4 proceed with Retacrit.            # DISPOSITION: # ALimta today; Beryle Flock; Retacrit today;   # Needs- udenyca ppt for tomorrow.  # follow up in 3 weeks- MD [facility] with diff, CMP, Mg], Alimta and pembrolizumab; retacrit;  D-2 Ellen Henri- Dr.B

## 2021-02-26 NOTE — Progress Notes (Signed)
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   *INTOLERANT to carboplatin at an AUC of 5 as well as 2. # NGS/MOLECULAR TESTS:Foundation One revealed no driver mutations  # PALLIATIVE CARE EVALUATION:  # PAIN MANAGEMENT:    DIAGNOSIS:   STAGE:         ;  GOALS:  CURRENT/MOST RECENT THERAPY :     Primary cancer of right lower lobe of lung ([place])  04/26/2018 Initial Diagnosis   Primary cancer of right lower lobe of lung ([place])    05/24/2018 - 01/10/2019 Chemotherapy   The patient had dexamethasone (DECADRON) 4 MG tablet, 1 of 1 cycle, Start date: 05/17/2018, End date: 08/12/2018 pembrolizumab (KEYTRUDA) 200 mg in sodium chloride 0.9 % 50 mL chemo infusion, 200 mg, Intravenous, Once, 11 of 14 cycles Administration: 200 mg (05/24/2018), 200 mg (07/16/2018), 200 mg (08/09/2018), 200 mg (08/30/2018), 200 mg (09/20/2018), 200 mg (06/14/2018), 200 mg (10/11/2018), 200 mg (11/08/2018), 200 mg (11/29/2018), 200 mg (12/20/2018), 200 mg (01/10/2019)   for chemotherapy treatment.     02/08/2019 - 04/12/2020 Chemotherapy   The patient had dexamethasone (DECADRON) 4 MG tablet, 1 of 1 cycle, Start date: 04/23/2020, End date: -- palonosetron (ALOXI) injection 0.25 mg, 0.25 mg, Intravenous,  Once, 21 of 23 cycles Administration: 0.25 mg (02/08/2019), 0.25 mg (03/29/2019), 0.25 mg (04/18/2019), 0.25 mg (02/28/2019), 0.25 mg (05/09/2019), 0.25 mg  (05/30/2019), 0.25 mg (06/27/2019), 0.25 mg (07/18/2019), 0.25 mg (08/08/2019), 0.25 mg (08/30/2019), 0.25 mg (09/20/2019), 0.25 mg (10/11/2019), 0.25 mg (11/08/2019), 0.25 mg (11/29/2019), 0.25 mg (12/21/2019), 0.25 mg (01/18/2020), 0.25 mg (02/08/2020), 0.25 mg (02/29/2020), 0.25 mg (03/22/2020), 0.25 mg (04/11/2020) pegfilgrastim-jmdb (FULPHILA) injection 6 mg, 6 mg, Subcutaneous,  Once, 4 of 6 cycles Administration: 6 mg (03/23/2020), 6 mg (04/12/2020) PEMEtrexed (ALIMTA) 900 mg in sodium chloride 0.9 % 100 mL chemo infusion, 500 mg/m2 = 900 mg, Intravenous,  Once, 21 of 23 cycles Dose modification: 400 mg/m2 (original dose 500 mg/m2, Cycle 14, Reason: Provider Judgment, Comment: change in reanl function, although CrCl > 45 ml/min) Administration: 900 mg (02/08/2019), 900 mg (03/29/2019), 900 mg (04/18/2019), 900 mg (05/09/2019), 900 mg (05/30/2019), 900 mg (02/28/2019), 900 mg (06/27/2019), 900 mg (07/18/2019), 900 mg (08/08/2019), 900 mg (08/30/2019), 900 mg (09/20/2019), 900 mg (10/11/2019), 900 mg (11/08/2019), 700 mg (11/29/2019), 700 mg (01/18/2020), 700 mg (02/08/2020), 700 mg (02/29/2020), 700 mg (12/21/2019), 700 mg (03/22/2020), 700 mg (04/11/2020) CARBOplatin (PARAPLATIN) 500 mg in sodium chloride 0.9 % 250 mL chemo infusion, 470 mg (100 % of original dose 472 mg), Intravenous,  Once, 2 of 2 cycles Dose modification:   (original dose 472 mg, Cycle 1) Administration: 500 mg (02/08/2019), 190 mg (04/18/2019) pembrolizumab (KEYTRUDA) 200 mg in sodium chloride 0.9 % 50 mL chemo infusion, 200 mg, Intravenous, Once, 21 of 23 cycles Administration: 200 mg (02/08/2019), 200 mg (03/29/2019), 200 mg (04/18/2019), 200 mg (05/09/2019), 200 mg (05/30/2019), 200 mg (02/28/2019), 200 mg (06/27/2019), 200 mg (07/18/2019), 200 mg (08/08/2019), 200 mg (08/30/2019), 200 mg (09/20/2019), 200 mg (10/11/2019), 200 mg (  11/08/2019), 200 mg (11/29/2019), 200 mg (12/21/2019), 200 mg (01/18/2020), 200 mg (02/08/2020), 200 mg (02/29/2020), 200 mg (03/22/2020), 200 mg  (04/11/2020) fosaprepitant (EMEND) 150 mg, dexamethasone (DECADRON) 12 mg in sodium chloride 0.9 % 145 mL IVPB, , Intravenous,  Once, 4 of 4 cycles Administration:  (02/08/2019),  (04/18/2019)   for chemotherapy treatment.     07/10/2020 -  Chemotherapy    Patient is on Treatment Plan: LUNG MAINTENANCE PEMETREXED + PEMBROLIZUMAB          HISTORY OF PRESENTING ILLNESS:  Jimmy West 70 y.o.  male history of metastatic lung cancer-non-small cell-currently on maintenance Alimta-Keytruda is here for follow-up.  In the interim patient underwent SBRT to the right lower lobe lung mass.  Finished on July 27.  Patient tolerated treatment fairly well.  However complains of mild difficulty swallowing especially to solid food.  Swallowing liquids well.  Denies any worsening shortness of breath cough.  Chronic fatigue.  Not any worse Review of Systems  Constitutional:  Positive for malaise/fatigue. Negative for chills, diaphoresis, fever and weight loss.  HENT:  Negative for nosebleeds and sore throat.   Eyes:  Negative for double vision.  Respiratory:  Negative for cough, hemoptysis, sputum production, shortness of breath and wheezing.   Cardiovascular:  Negative for chest pain, palpitations, orthopnea and leg swelling.  Gastrointestinal:  Negative for abdominal pain, blood in stool, constipation, diarrhea, heartburn, melena, nausea and vomiting.  Genitourinary:  Negative for dysuria, frequency and urgency.  Musculoskeletal:  Positive for back pain and joint pain.  Skin: Negative.  Negative for itching and rash.  Neurological:  Negative for dizziness, tingling, focal weakness, weakness and headaches.  Endo/Heme/Allergies:  Does not bruise/bleed easily.  Psychiatric/Behavioral:  Negative for depression. The patient is not nervous/anxious and does not have insomnia.     MEDICAL HISTORY:  Past Medical History:  Diagnosis Date   Bulging lumbar disc    Cancer ([place])    stage 4 lung  cancer   Heart attack ([place])     SURGICAL HISTORY: Past Surgical History:  Procedure Laterality Date   CARDIAC CATHETERIZATION     two stents   KNEE SURGERY Left    PORTA CATH INSERTION N/A 05/21/2018   Procedure: PORTA CATH INSERTION;  Surgeon: Algernon Huxley, MD;  Location: [facility];  Service: Cardiovascular;  Laterality: N/A;    SOCIAL HISTORY: Social History   Socioeconomic History   Marital status: Single    Spouse name: Not on file   Number of children: Not on file   Years of education: Not on file   Highest education level: Not on file  Occupational History   Not on file  Tobacco Use   Smoking status: Former    Packs/day: 1.50    Years: 15.00    Pack years: 22.50    Types: Cigarettes    Quit date: 11/03/2003    Years since quitting: 17.3   Smokeless tobacco: Never  Vaping Use   Vaping Use: Never used  Substance and Sexual Activity   Alcohol use: Not Currently   Drug use: No   Sexual activity: Not Currently  Other Topics Concern   Not on file  Social History Narrative   Not on file   Social Determinants of Health   Financial Resource Strain: Not on file  Food Insecurity: Not on file  Transportation Needs: Not on file  Physical Activity: Not on file  Stress: Not on file  Social Connections: Not on file  Intimate Partner Violence: Not on file    FAMILY HISTORY: Family History  Problem Relation Age of Onset   Heart failure Father    Cancer Maternal Aunt    Heart failure Maternal Uncle    Dementia Paternal Grandmother    Cancer Maternal Aunt     ALLERGIES:  has No Known Allergies.  MEDICATIONS:  Current Outpatient Medications  Medication Sig Dispense Refill   albuterol (VENTOLIN HFA) 108 (90 Base) MCG/ACT inhaler Inhale 2 puffs into the lungs every 6 (six) hours as needed for wheezing or shortness of breath. 18 g 3   ALPRAZolam (XANAX) 0.5 MG tablet Take 1 tablet (0.5 mg total) by mouth 4 (four) times daily as needed for anxiety. 60  tablet 0   apixaban (ELIQUIS) 5 MG TABS tablet Take 1 tablet (5 mg total) by mouth 2 (two) times daily. 180 tablet 3   dexamethasone (DECADRON) 4 MG tablet Take 1 tab two times a day the day before Alimta chemo, then take 2 tabs once a day for 3 days starting the day after chemo. 30 tablet 1   FLUoxetine (PROZAC) 40 MG capsule Take 1 capsule (40 mg total) by mouth daily. 90 capsule 1   folic acid (FOLVITE) 1 MG tablet Take 1 tablet (1 mg total) by mouth daily. Continue until 21 days after Alimta completed. 30 tablet 3   OLANZapine (ZYPREXA) 5 MG tablet Take 1 tablet (5 mg total) by mouth at bedtime. 30 tablet 3   Oxycodone HCl 20 MG TABS Take 1-1.5 tablets (20-30 mg total) by mouth every 4 (four) hours as needed. 90 tablet 0   polyethylene glycol (MIRALAX / GLYCOLAX) packet Take 17 g by mouth daily.     senna (SENOKOT) 8.6 MG tablet Take 1 tablet by mouth as needed.      prochlorperazine (COMPAZINE) 10 MG tablet Take 1 tablet (10 mg total) by mouth every 6 (six) hours as needed for nausea or vomiting. (Patient not taking: Reported on 02/26/2021) 30 tablet 0   No current facility-administered medications for this visit.   Facility-Administered Medications Ordered in Other Visits  Medication Dose Route Frequency Provider Last Rate Last Admin   0.9 %  sodium chloride infusion   Intravenous Once Corcoran, Melissa C, MD       0.9 %  sodium chloride infusion   Intravenous Once PRN Corcoran, Melissa C, MD       albuterol (PROVENTIL) (2.5 MG/3ML) 0.083% nebulizer solution 2.5 mg  2.5 mg Nebulization Once PRN Corcoran, Melissa C, MD       alteplase (CATHFLO ACTIVASE) injection 2 mg  2 mg Intracatheter Once PRN Lequita Asal, MD       cyanocobalamin ((VITAMIN B-12)) injection 1,000 mcg  1,000 mcg Intramuscular Once Lloyd Huger, MD       EPINEPHrine (ADRENALIN) 1 MG/10ML injection 0.25 mg  0.25 mg Intravenous Once PRN Corcoran, Melissa C, MD       EPINEPHrine (ADRENALIN) 1 MG/10ML injection  0.25 mg  0.25 mg Intravenous Once PRN Corcoran, Melissa C, MD       heparin lock flush 100 unit/mL  500 Units Intracatheter Once PRN Nolon Stalls C, MD       heparin lock flush 100 unit/mL  250 Units Intracatheter Once PRN Nolon Stalls C, MD       heparin lock flush 100 unit/mL  500 Units Intracatheter Once PRN Lloyd Huger, MD       PEMEtrexed (ALIMTA) 700 mg in sodium chloride  0.9 % 100 mL chemo infusion  700 mg Intravenous Once Charlaine Dalton R, MD       sodium chloride flush (NS) 0.9 % injection 10 mL  10 mL Intravenous PRN Nolon Stalls C, MD   10 mL at 03/04/19 0959   sodium chloride flush (NS) 0.9 % injection 10 mL  10 mL Intracatheter Once PRN Lequita Asal, MD       sodium chloride flush (NS) 0.9 % injection 3 mL  3 mL Intracatheter Once PRN Lequita Asal, MD          .  PHYSICAL EXAMINATION: ECOG PERFORMANCE STATUS: 1 - Symptomatic but completely ambulatory  Vitals:   02/26/21 1020  BP: (!) 111/56  Pulse: 69  Resp: 16  Temp: (!) 96.5 F (35.8 C)  SpO2: 99%   Filed Weights   02/26/21 1020  Weight: 142 lb 8.4 oz (64.7 kg)    Physical Exam Constitutional:      Comments: Alone.  Ambulating independently.  HENT:     Head: Normocephalic and atraumatic.     Mouth/Throat:     Pharynx: No oropharyngeal exudate.  Eyes:     Pupils: Pupils are equal, round, and reactive to light.  Cardiovascular:     Rate and Rhythm: Normal rate and regular rhythm.  Pulmonary:     Effort: No respiratory distress.     Breath sounds: No wheezing.     Comments: Decreased breath sounds bilaterally in the bases. Abdominal:     General: Bowel sounds are normal. There is no distension.     Palpations: Abdomen is soft. There is no mass.     Tenderness: no abdominal tenderness There is no guarding or rebound.  Musculoskeletal:        General: No tenderness. Normal range of motion.     Cervical back: Normal range of motion and neck supple.  Skin:     General: Skin is warm.  Neurological:     Mental Status: He is alert and oriented to person, place, and time.  Psychiatric:        Mood and Affect: Affect normal.     LABORATORY DATA:  I have reviewed the data as listed Lab Results  Component Value Date   WBC 13.1 (H) 02/26/2021   HGB 7.9 (L) 02/26/2021   HCT 25.6 (L) 02/26/2021   MCV 88.3 02/26/2021   PLT 349 02/26/2021   Recent Labs    03/12/20 1401 03/21/20 0904 04/11/20 0905 06/25/20 0851 01/11/21 0831 02/05/21 0827 02/26/21 0954  NA 137 139 137   < > 134* 137 136  K 3.5 3.6 4.3   < > 4.0 3.8 4.0  CL 105 102 104   < > 100 102 103  CO2 25 26 22    < > 24 26 26   GLUCOSE 110* 134* 169*   < > 160* 105* 128*  BUN 7* 17 9   < > 16 9 16   CREATININE 1.06 1.19 1.24   < > 1.63* 1.21 1.17  CALCIUM 8.7* 9.8 8.6*   < > 8.9 9.1 8.8*  GFRNONAA >60 >60 59*   < > 45* >60 >60  GFRAA >60 >60 >60  --   --   --   --   PROT  --  6.6 7.0   < > 7.0 7.0 6.7  ALBUMIN  --  3.4* 3.4*   < > 3.3* 3.3* 3.0*  AST  --  24 25   < > 22 18  18  ALT  --  13 11   < > 9 7 7   ALKPHOS  --  68 86   < > 97 70 83  BILITOT  --  0.5 0.5   < > 0.3 0.8 0.4   < > = values in this interval not displayed.    RADIOGRAPHIC STUDIES: I have personally reviewed the radiological images as listed and agreed with the findings in the report. US Venous Img Lower Unilateral Left  Result Date: 02/07/2021 CLINICAL DATA:  Left lower extremity pain and edema for the past week. History of lung cancer. Evaluate for DVT. EXAM: LEFT LOWER EXTREMITY VENOUS DOPPLER ULTRASOUND TECHNIQUE: Gray-scale sonography with graded compression, as well as color Doppler and duplex ultrasound were performed to evaluate the lower extremity deep venous systems from the level of the common femoral vein and including the common femoral, femoral, profunda femoral, popliteal and calf veins including the posterior tibial, peroneal and gastrocnemius veins when visible. The superficial great saphenous vein  was also interrogated. Spectral Doppler was utilized to evaluate flow at rest and with distal augmentation maneuvers in the common femoral, femoral and popliteal veins. COMPARISON:  None. FINDINGS: Contralateral Common Femoral Vein: Respiratory phasicity is normal and symmetric with the symptomatic side. No evidence of thrombus. Normal compressibility. Common Femoral Vein: No evidence of thrombus. Normal compressibility, respiratory phasicity and response to augmentation. Saphenofemoral Junction: No evidence of thrombus. Normal compressibility and flow on color Doppler imaging. Profunda Femoral Vein: No evidence of thrombus. Normal compressibility and flow on color Doppler imaging. Femoral Vein: No evidence of thrombus. Normal compressibility, respiratory phasicity and response to augmentation. Popliteal Vein: No evidence of thrombus. Normal compressibility, respiratory phasicity and response to augmentation. Calf Veins: No evidence of thrombus. Normal compressibility and flow on color Doppler imaging. Superficial Great Saphenous Vein: No evidence of thrombus. Normal compressibility. Venous Reflux:  None. Other Findings: Note is made of an approximately 4.5 x 1.0 x 2.7 cm serpiginous anechoic left-sided Baker's cyst. There is a minimal amount of subcutaneous edema at the level of the left ankle. IMPRESSION: 1. No evidence DVT within the lower extremity. 2. Incidental note made of an approximately 4.5 cm left-sided Baker's cyst. Electronically Signed   By: Sandi Mariscal M.D.   On: 02/07/2021 12:05    ASSESSMENT & PLAN:   Primary cancer of right lower lobe of lung [facility]) # Metastatic lung cancer-on keytruda+Alimta maintenance; PET scan June 2022-progressive right lower lobe hypermetabolic mass; no evidence of any distant metastatic disease or mediastinal adenopathy or osseous metastases.   # Proceed with Keytruda+ alimta. Labs today reviewed;  acceptable for treatment today.   patient will need Reatcrit/Udenyca  D-2.  Hb 7.4.   # Increasing size of the right upper lobe lung nodule currently undergoing SBRT [July 26th- last].  Discussed that we will plan to get a CT scan number 3 months posttreatment.  #CKD stage III-GFR-GFR 60.  Stable.  #Back pain/bone mets-continue current oxycodone; Zometa every 6 weeks-?-Zometa today.  Calcium normal stable  #Intermittent difficulty swallowing-not any worse.  Discussed possibility of radiation-induced esophagitis or stricture from initial radiation[ but not from recent SBRT.]-If worse would recommend evaluation with GI/EGD/stricture dilation.  # Anxiety and depression: Stable;  Xanax as well as Prozac           #  Pulmonary embolism:  Continue Eliquis.  Stable.  # Chemotherapy induced anemia: on retacrit hemoglobin around 7.4 proceed with Retacrit.            #  DISPOSITION: # ALimta today; Beryle Flock; Retacrit today;   # Needs- udenyca ppt for tomorrow.  # follow up in 3 weeks- [facility] with diff, CMP, Mg], Alimta and pembrolizumab; retacrit;  D-2 Ellen Henri- Dr.B         All questions were answered. The patient knows to call the clinic with any problems, questions or concerns.       Cammie Sickle, MD 02/26/2021 1:00 PM

## 2021-02-27 ENCOUNTER — Ambulatory Visit: Payer: Medicare HMO

## 2021-02-27 ENCOUNTER — Other Ambulatory Visit: Payer: Self-pay | Admitting: Internal Medicine

## 2021-02-27 DIAGNOSIS — C3431 Malignant neoplasm of lower lobe, right bronchus or lung: Secondary | ICD-10-CM

## 2021-02-28 ENCOUNTER — Other Ambulatory Visit: Payer: Self-pay | Admitting: *Deleted

## 2021-02-28 DIAGNOSIS — C3431 Malignant neoplasm of lower lobe, right bronchus or lung: Secondary | ICD-10-CM

## 2021-02-28 MED ORDER — OLANZAPINE 5 MG PO TABS: 5.0000 mg | ORAL_TABLET | Freq: Every day | ORAL | 3 refills | Status: DC

## 2021-02-28 NOTE — Telephone Encounter (Signed)
Incoming pharmacy request for Zyprexa

## 2021-03-06 ENCOUNTER — Other Ambulatory Visit: Payer: Self-pay | Admitting: *Deleted

## 2021-03-06 DIAGNOSIS — F419 Anxiety disorder, unspecified: Secondary | ICD-10-CM

## 2021-03-06 DIAGNOSIS — G893 Neoplasm related pain (acute) (chronic): Secondary | ICD-10-CM

## 2021-03-06 MED ORDER — ALPRAZOLAM 0.5 MG PO TABS: 0.5000 mg | ORAL_TABLET | Freq: Four times a day (QID) | ORAL | 0 refills | Status: DC | PRN

## 2021-03-06 MED ORDER — OXYCODONE HCL 20 MG PO TABS: 20.0000 mg | ORAL_TABLET | ORAL | 0 refills | Status: DC | PRN

## 2021-03-06 NOTE — Telephone Encounter (Signed)
Requests refill of oxycodone and xanax. States is due for refill on Sunday but would like to pick up on Saturday since pharmacy will be closed on Sunday.

## 2021-03-07 ENCOUNTER — Other Ambulatory Visit: Payer: Self-pay

## 2021-03-07 ENCOUNTER — Inpatient Hospital Stay: Payer: Medicare HMO | Attending: Internal Medicine | Admitting: Hospice and Palliative Medicine

## 2021-03-07 DIAGNOSIS — Z79899 Other long term (current) drug therapy: Secondary | ICD-10-CM | POA: Insufficient documentation

## 2021-03-07 DIAGNOSIS — C3431 Malignant neoplasm of lower lobe, right bronchus or lung: Secondary | ICD-10-CM | POA: Insufficient documentation

## 2021-03-07 DIAGNOSIS — D6481 Anemia due to antineoplastic chemotherapy: Secondary | ICD-10-CM | POA: Insufficient documentation

## 2021-03-07 DIAGNOSIS — N1831 Chronic kidney disease, stage 3a: Secondary | ICD-10-CM | POA: Insufficient documentation

## 2021-03-07 DIAGNOSIS — C7951 Secondary malignant neoplasm of bone: Secondary | ICD-10-CM | POA: Insufficient documentation

## 2021-03-07 DIAGNOSIS — Z515 Encounter for palliative care: Secondary | ICD-10-CM

## 2021-03-07 NOTE — Progress Notes (Signed)
Did not reach patient for scheduled MyChart visit.  Voicemail left.  Will reschedule.

## 2021-03-09 ENCOUNTER — Telehealth: Payer: Self-pay | Admitting: Internal Medicine

## 2021-03-09 ENCOUNTER — Other Ambulatory Visit: Payer: Self-pay | Admitting: Internal Medicine

## 2021-03-09 DIAGNOSIS — F419 Anxiety disorder, unspecified: Secondary | ICD-10-CM

## 2021-03-09 NOTE — Progress Notes (Signed)
On 8/20-patient called upset unable to fill his Xanax as pharmacy refused to fill the prescription sooner.  States that he had been taking more Xanax than prescribed-as he was quite stressed that charity care will be limited going forward.  Temporarily-my was plan increase the dose of Xanax to 1 mg; however to take it every 6 hours [previous dose of 0.5 every 4 hours].   Josh-appreciate if you could reach out to patient on 8/22 when he had a chance.  Hayley-please check into charity care issues.  Thanks.  GB Addendum: again spoke to pt "unhappy as pharmacy closes at 1pm"; offered to call to other pharmacy-; declines "states will wait until tuesday".

## 2021-03-09 NOTE — Telephone Encounter (Signed)
On 8/20-patient called upset unable to fill his Xanax as pharmacy refused to fill the prescription sooner.  States that he had been taking more Xanax than prescribed-as he was quite stressed that charity care will be limited going forward.   Temporarily-my was plan increase the dose of Xanax to 1 mg; however to take it every 6 hours [previous dose of 0.5 every 4 hours].    Josh-appreciate if you could reach out to patient on 8/22 when he had a chance.   Hayley-please check into charity care issues.   Thanks.   GB Addendum: again spoke to pt "unhappy as pharmacy closes at 1pm"; offered to call to other pharmacy-; declines "states will wait until tuesday".

## 2021-03-11 MED ORDER — LORAZEPAM 0.5 MG PO TABS: 0.5000 mg | ORAL_TABLET | Freq: Four times a day (QID) | ORAL | 0 refills | Status: DC | PRN

## 2021-03-11 NOTE — Addendum Note (Signed)
Addended by: Irean Hong on: 03/11/2021 09:54 AM   Modules accepted: Orders

## 2021-03-11 NOTE — Telephone Encounter (Signed)
Spoke with patient this morning.  He endorses worse anxiety situationally after learning that his charity care would expire.  He is worried about the financial costs of medications and treatment.  He does admit to having taken Xanax more frequently than prescribed but also reports that he feels the Xanax has been less effective recently.  We will plan to rotate from Xanax to lorazepam.  Klonopin could be another option as it is longer acting.  Consideration could also be given to increasing dose of fluoxetine.  We will plan a virtual visit next week to reassess.

## 2021-03-18 ENCOUNTER — Inpatient Hospital Stay (HOSPITAL_BASED_OUTPATIENT_CLINIC_OR_DEPARTMENT_OTHER): Payer: Medicare HMO | Admitting: Hospice and Palliative Medicine

## 2021-03-18 DIAGNOSIS — F419 Anxiety disorder, unspecified: Secondary | ICD-10-CM

## 2021-03-18 DIAGNOSIS — C3431 Malignant neoplasm of lower lobe, right bronchus or lung: Secondary | ICD-10-CM

## 2021-03-18 DIAGNOSIS — R69 Illness, unspecified: Secondary | ICD-10-CM | POA: Diagnosis not present

## 2021-03-18 DIAGNOSIS — Z515 Encounter for palliative care: Secondary | ICD-10-CM

## 2021-03-18 DIAGNOSIS — G893 Neoplasm related pain (acute) (chronic): Secondary | ICD-10-CM

## 2021-03-18 MED ORDER — ALPRAZOLAM 1 MG PO TABS: 1.0000 mg | ORAL_TABLET | Freq: Three times a day (TID) | ORAL | 0 refills | Status: DC | PRN

## 2021-03-18 NOTE — Progress Notes (Signed)
Virtual Visit via Telephone Note  I connected with Jimmy West on 03/18/21 at  1:00 PM EDT by telephone and verified that I am speaking with the correct person using two identifiers.   I discussed the limitations, risks, security and privacy concerns of performing an evaluation and management service by telephone and the availability of in person appointments. I also discussed with the patient that there may be a patient responsible charge related to this service. The patient expressed understanding and agreed to proceed.   History of Present Illness: Jimmy West is a 70 year old male with multiple medical problems including 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  Patient has had chronic neoplasm related pain and severe anxiety/depression.  He was referred to palliative care clinic to assist with symptom management and to establish treatment goals.    Observations/Objective: Follow-up virtual visit after rotating patient from alprazolam to lorazepam.  Patient reports that he does not like the way the lorazepam makes him feel.  He feels like he is to groggy.  We discussed the option of decreasing the dose of the lorazepam.  However, patient said he would prefer to rotate back to alprazolam if possible.  Dr. Rogue Bussing had previously suggested option of increasing the alprazolam to 1 mg but decreasing the frequency to every 8 hours.  Patient would like to try that.  Patient denies any other changes or concerns.  Assessment and Plan: Stage IV lung cancer -on treatment with immunotherapy.   Neoplasm related pain -continue oxycodone 20 to 30 mg every 4 hours as needed for breakthrough pain  Anxiety -alprazolam 1 mg every 8 hours as needed  Depression -continue Prozac/olanzapine  Constipation -continue daily senna and MiraLAX  Follow Up Instructions: Follow-up MyChart visit 1-2 months   I discussed the assessment and treatment plan with the patient. The  patient was provided an opportunity to ask questions and all were answered. The patient agreed with the plan and demonstrated an understanding of the instructions.   The patient was advised to call back or seek an in-person evaluation if the symptoms worsen or if the condition fails to improve as anticipated.  I provided 10 minutes of non-face-to-face time during this encounter.   Irean Hong, NP

## 2021-03-19 ENCOUNTER — Encounter: Payer: Self-pay | Admitting: Internal Medicine

## 2021-03-19 ENCOUNTER — Inpatient Hospital Stay: Payer: Medicare HMO

## 2021-03-19 ENCOUNTER — Other Ambulatory Visit: Payer: Self-pay

## 2021-03-19 ENCOUNTER — Inpatient Hospital Stay (HOSPITAL_BASED_OUTPATIENT_CLINIC_OR_DEPARTMENT_OTHER): Payer: Medicare HMO | Admitting: Internal Medicine

## 2021-03-19 VITALS — BP 117/62 | HR 68 | Temp 97.3°F | Resp 18

## 2021-03-19 DIAGNOSIS — C7951 Secondary malignant neoplasm of bone: Secondary | ICD-10-CM

## 2021-03-19 DIAGNOSIS — Z7901 Long term (current) use of anticoagulants: Secondary | ICD-10-CM | POA: Diagnosis not present

## 2021-03-19 DIAGNOSIS — C3431 Malignant neoplasm of lower lobe, right bronchus or lung: Secondary | ICD-10-CM

## 2021-03-19 DIAGNOSIS — R69 Illness, unspecified: Secondary | ICD-10-CM | POA: Diagnosis not present

## 2021-03-19 DIAGNOSIS — Z79899 Other long term (current) drug therapy: Secondary | ICD-10-CM | POA: Diagnosis not present

## 2021-03-19 DIAGNOSIS — Z5111 Encounter for antineoplastic chemotherapy: Secondary | ICD-10-CM | POA: Diagnosis not present

## 2021-03-19 DIAGNOSIS — D6481 Anemia due to antineoplastic chemotherapy: Secondary | ICD-10-CM | POA: Diagnosis not present

## 2021-03-19 DIAGNOSIS — N1831 Chronic kidney disease, stage 3a: Secondary | ICD-10-CM | POA: Diagnosis not present

## 2021-03-19 DIAGNOSIS — Z86711 Personal history of pulmonary embolism: Secondary | ICD-10-CM | POA: Diagnosis not present

## 2021-03-19 LAB — CBC WITH DIFFERENTIAL/PLATELET
Abs Immature Granulocytes: 0.03 10*3/uL (ref 0.00–0.07)
Basophils Absolute: 0 10*3/uL (ref 0.0–0.1)
Basophils Relative: 0 %
Eosinophils Absolute: 0.1 10*3/uL (ref 0.0–0.5)
Eosinophils Relative: 2 %
HCT: 25.2 % — ABNORMAL LOW (ref 39.0–52.0)
Hemoglobin: 7.8 g/dL — ABNORMAL LOW (ref 13.0–17.0)
Immature Granulocytes: 1 %
Lymphocytes Relative: 14 %
Lymphs Abs: 0.8 10*3/uL (ref 0.7–4.0)
MCH: 27 pg (ref 26.0–34.0)
MCHC: 31 g/dL (ref 30.0–36.0)
MCV: 87.2 fL (ref 80.0–100.0)
Monocytes Absolute: 1 10*3/uL (ref 0.1–1.0)
Monocytes Relative: 18 %
Neutro Abs: 3.4 10*3/uL (ref 1.7–7.7)
Neutrophils Relative %: 65 %
Platelets: 590 10*3/uL — ABNORMAL HIGH (ref 150–400)
RBC: 2.89 MIL/uL — ABNORMAL LOW (ref 4.22–5.81)
RDW: 20.9 % — ABNORMAL HIGH (ref 11.5–15.5)
WBC: 5.3 10*3/uL (ref 4.0–10.5)
nRBC: 0 % (ref 0.0–0.2)

## 2021-03-19 LAB — COMPREHENSIVE METABOLIC PANEL
ALT: 9 U/L (ref 0–44)
AST: 18 U/L (ref 15–41)
Albumin: 3.3 g/dL — ABNORMAL LOW (ref 3.5–5.0)
Alkaline Phosphatase: 66 U/L (ref 38–126)
Anion gap: 9 (ref 5–15)
BUN: 12 mg/dL (ref 8–23)
CO2: 26 mmol/L (ref 22–32)
Calcium: 9 mg/dL (ref 8.9–10.3)
Chloride: 101 mmol/L (ref 98–111)
Creatinine, Ser: 1.16 mg/dL (ref 0.61–1.24)
GFR, Estimated: >60 mL/min (ref >60)
Glucose, Bld: 98 mg/dL (ref 70–99)
Potassium: 3.2 mmol/L — ABNORMAL LOW (ref 3.5–5.1)
Sodium: 136 mmol/L (ref 135–145)
Total Bilirubin: 0.4 mg/dL (ref 0.3–1.2)
Total Protein: 6.9 g/dL (ref 6.5–8.1)

## 2021-03-19 LAB — TSH: TSH: 3.908 u[IU]/mL (ref 0.350–4.500)

## 2021-03-19 LAB — IRON AND TIBC
Iron: 40 ug/dL — ABNORMAL LOW (ref 45–182)
Saturation Ratios: 12 % — ABNORMAL LOW (ref 17.9–39.5)
TIBC: 323 ug/dL (ref 250–450)
UIBC: 283 ug/dL

## 2021-03-19 LAB — MAGNESIUM: Magnesium: 1.7 mg/dL (ref 1.7–2.4)

## 2021-03-19 LAB — FERRITIN: Ferritin: 752 ng/mL — ABNORMAL HIGH (ref 24–336)

## 2021-03-19 MED ORDER — HEPARIN SOD (PORK) LOCK FLUSH 100 UNIT/ML IV SOLN
500.0000 [IU] | Freq: Once | INTRAVENOUS | Status: AC
  Administered 2021-03-19: 500 [IU] via INTRAVENOUS
  Filled 2021-03-19: qty 5

## 2021-03-19 MED ORDER — SODIUM CHLORIDE 0.9 % IV SOLN
INTRAVENOUS | Status: AC
  Filled 2021-03-19: qty 250

## 2021-03-19 MED ORDER — EPOETIN ALFA-EPBX 10000 UNIT/ML IJ SOLN
10000.0000 [IU] | Freq: Once | INTRAMUSCULAR | Status: AC
  Administered 2021-03-19: 10000 [IU] via SUBCUTANEOUS
  Filled 2021-03-19: qty 1

## 2021-03-19 MED ORDER — SODIUM CHLORIDE 0.9 % IV SOLN
700.0000 mg | Freq: Once | INTRAVENOUS | Status: AC
  Administered 2021-03-19: 700 mg via INTRAVENOUS
  Filled 2021-03-19: qty 20

## 2021-03-19 MED ORDER — SODIUM CHLORIDE 0.9 % IV SOLN
200.0000 mg | Freq: Once | INTRAVENOUS | Status: AC
  Administered 2021-03-19: 200 mg via INTRAVENOUS
  Filled 2021-03-19: qty 8

## 2021-03-19 MED ORDER — CYANOCOBALAMIN 1000 MCG/ML IJ SOLN
1000.0000 ug | Freq: Once | INTRAMUSCULAR | Status: AC
  Administered 2021-03-19: 1000 ug via INTRAMUSCULAR
  Filled 2021-03-19: qty 1

## 2021-03-19 NOTE — Assessment & Plan Note (Addendum)
#   Metastatic lung cancer-on keytruda+Alimta maintenance; PET scan June 2022-progressive right lower lobe hypermetabolic mass; no evidence of any distant metastatic disease or mediastinal adenopathy or osseous metastases.   # Proceed with Keytruda+ alimta. Labs today reviewed;  acceptable for treatment today.   patient will need Reatcrit/Udenyca D-2.  Hb 7.8   #Anemia-secondary chemotherapy hemoglobin 7.8-proceed with Retacrit every 3 weeks.  We will add iron studies.  # JUne 2022- Increasing size of the right upper lobe lung nodule currently undergoing SBRT [July 26th- last].  Discussed that we will plan to get a CT scan in end of sep, 2022.   #CKD stage III-GFR-GFR 60- STABLE.   #Back pain/bone mets-continue current oxycodone; Zometa every 3 months; Zometa 3.5 next visit  Calcium normal stable  #Intermittent difficulty swallowing- ? RT- imporved; monitor for now.    #Anxiety and depression: Stable;  Xanax as well as Prozac           #Pulmonary embolism: Continue Eliquis.  Stable.  # Chemotherapy induced anemia: on retacrit hemoglobin around 7.4 proceed with Retacrit.            # DISPOSITION: # ALimta today; Beryle Flock; Retacrit today;   # Needs- udenyca ppt for tomorrow.  # follow up in 3 weeks- [facility] with diff, CMP, Mg], Alimta and pembrolizumab;Zometa- retacrit;  D-2 Ellen Henri- Dr.B

## 2021-03-19 NOTE — Addendum Note (Signed)
Addended by: Gloris Ham on: 03/19/2021 09:58 AM   Modules accepted: Orders

## 2021-03-19 NOTE — Addendum Note (Signed)
Addended by: Gloris Ham on: 03/19/2021 09:52 AM   Modules accepted: Orders

## 2021-03-19 NOTE — Patient Instructions (Signed)
[facility]  Discharge Instructions: Thank you for choosing [facility] to provide your oncology and hematology care.  If you have a lab appointment with the Cancer Center, please go directly to the Cancer Center and check in at the registration area.  Wear comfortable clothing and clothing appropriate for easy access to any Portacath or PICC line.   We strive to give you quality time with your provider. You may need to reschedule your appointment if you arrive late (15 or more minutes).  Arriving late affects you and other patients whose appointments are after yours.  Also, if you miss three or more appointments without notifying the office, you may be dismissed from the clinic at the provider's discretion.      For prescription refill requests, have your pharmacy contact our office and allow 72 hours for refills to be completed.    Today you received the following chemotherapy and/or immunotherapy agents       To help prevent nausea and vomiting after your treatment, we encourage you to take your nausea medication as directed.  BELOW ARE SYMPTOMS THAT SHOULD BE REPORTED IMMEDIATELY: *FEVER GREATER THAN 100.4 F (38 C) OR HIGHER *CHILLS OR SWEATING *NAUSEA AND VOMITING THAT IS NOT CONTROLLED WITH YOUR NAUSEA MEDICATION *UNUSUAL SHORTNESS OF BREATH *UNUSUAL BRUISING OR BLEEDING *URINARY PROBLEMS (pain or burning when urinating, or frequent urination) *BOWEL PROBLEMS (unusual diarrhea, constipation, pain near the anus) TENDERNESS IN MOUTH AND THROAT WITH OR WITHOUT PRESENCE OF ULCERS (sore throat, sores in mouth, or a toothache) UNUSUAL RASH, SWELLING OR PAIN  UNUSUAL VAGINAL DISCHARGE OR ITCHING   Items with * indicate a potential emergency and should be followed up as soon as possible or go to the Emergency Department if any problems should occur.  Please show the CHEMOTHERAPY ALERT CARD or IMMUNOTHERAPY ALERT CARD at check-in to the Emergency  Department and triage nurse.  Should you have questions after your visit or need to cancel or reschedule your appointment, please contact [facility]  [phone] and follow the prompts.  Office hours are 8:00 a.m. to 4:30 p.m. Monday - Friday. Please note that voicemails left after 4:00 p.m. may not be returned until the following business day.  We are closed weekends and major holidays. You have access to a nurse at all times for urgent questions. Please call the main number to the clinic [phone] and follow the prompts.  For any non-urgent questions, you may also contact your provider using MyChart. We now offer e-Visits for anyone 18 and older to request care online for non-urgent symptoms. For details visit [email or web address].   Also download the MyChart app! Go to the app store, search "MyChart", open the app, select [facility], and log in with your MyChart username and password.  Due to Covid, a mask is required upon entering the hospital/clinic. If you do not have a mask, one will be given to you upon arrival. For doctor visits, patients may have 1 support person aged 18 or older with them. For treatment visits, patients cannot have anyone with them due to current Covid guidelines and our immunocompromised population.  

## 2021-03-19 NOTE — Progress Notes (Signed)
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   *INTOLERANT to carboplatin at an AUC of 5 as well as 2. # NGS/MOLECULAR TESTS:Foundation One revealed no driver mutations  # PALLIATIVE CARE EVALUATION:  # PAIN MANAGEMENT:    DIAGNOSIS:   STAGE:         ;  GOALS:  CURRENT/MOST RECENT THERAPY :     Primary cancer of right lower lobe of lung ([place])  04/26/2018 Initial Diagnosis   Primary cancer of right lower lobe of lung ([place])   05/24/2018 - 01/10/2019 Chemotherapy   The patient had dexamethasone (DECADRON) 4 MG tablet, 1 of 1 cycle, Start date: 05/17/2018, End date: 08/12/2018 pembrolizumab (KEYTRUDA) 200 mg in sodium chloride 0.9 % 50 mL chemo infusion, 200 mg, Intravenous, Once, 11 of 14 cycles Administration: 200 mg (05/24/2018), 200 mg (07/16/2018), 200 mg (08/09/2018), 200 mg (08/30/2018), 200 mg (09/20/2018), 200 mg (06/14/2018), 200 mg (10/11/2018), 200 mg (11/08/2018), 200 mg (11/29/2018), 200 mg (12/20/2018), 200 mg (01/10/2019)   for chemotherapy treatment.     02/08/2019 - 04/12/2020 Chemotherapy   The patient had dexamethasone (DECADRON) 4 MG tablet, 1 of 1 cycle, Start date: 04/23/2020, End date: -- palonosetron (ALOXI) injection 0.25 mg, 0.25 mg, Intravenous,  Once, 21 of 23 cycles Administration: 0.25 mg (02/08/2019), 0.25 mg (03/29/2019), 0.25 mg (04/18/2019), 0.25 mg (02/28/2019), 0.25 mg (05/09/2019), 0.25 mg  (05/30/2019), 0.25 mg (06/27/2019), 0.25 mg (07/18/2019), 0.25 mg (08/08/2019), 0.25 mg (08/30/2019), 0.25 mg (09/20/2019), 0.25 mg (10/11/2019), 0.25 mg (11/08/2019), 0.25 mg (11/29/2019), 0.25 mg (12/21/2019), 0.25 mg (01/18/2020), 0.25 mg (02/08/2020), 0.25 mg (02/29/2020), 0.25 mg (03/22/2020), 0.25 mg (04/11/2020) pegfilgrastim-jmdb (FULPHILA) injection 6 mg, 6 mg, Subcutaneous,  Once, 4 of 6 cycles Administration: 6 mg (03/23/2020), 6 mg (04/12/2020) PEMEtrexed (ALIMTA) 900 mg in sodium chloride 0.9 % 100 mL chemo infusion, 500 mg/m2 = 900 mg, Intravenous,  Once, 21 of 23 cycles Dose modification: 400 mg/m2 (original dose 500 mg/m2, Cycle 14, Reason: Provider Judgment, Comment: change in reanl function, although CrCl > 45 ml/min) Administration: 900 mg (02/08/2019), 900 mg (03/29/2019), 900 mg (04/18/2019), 900 mg (05/09/2019), 900 mg (05/30/2019), 900 mg (02/28/2019), 900 mg (06/27/2019), 900 mg (07/18/2019), 900 mg (08/08/2019), 900 mg (08/30/2019), 900 mg (09/20/2019), 900 mg (10/11/2019), 900 mg (11/08/2019), 700 mg (11/29/2019), 700 mg (01/18/2020), 700 mg (02/08/2020), 700 mg (02/29/2020), 700 mg (12/21/2019), 700 mg (03/22/2020), 700 mg (04/11/2020) CARBOplatin (PARAPLATIN) 500 mg in sodium chloride 0.9 % 250 mL chemo infusion, 470 mg (100 % of original dose 472 mg), Intravenous,  Once, 2 of 2 cycles Dose modification:   (original dose 472 mg, Cycle 1) Administration: 500 mg (02/08/2019), 190 mg (04/18/2019) pembrolizumab (KEYTRUDA) 200 mg in sodium chloride 0.9 % 50 mL chemo infusion, 200 mg, Intravenous, Once, 21 of 23 cycles Administration: 200 mg (02/08/2019), 200 mg (03/29/2019), 200 mg (04/18/2019), 200 mg (05/09/2019), 200 mg (05/30/2019), 200 mg (02/28/2019), 200 mg (06/27/2019), 200 mg (07/18/2019), 200 mg (08/08/2019), 200 mg (08/30/2019), 200 mg (09/20/2019), 200 mg (10/11/2019), 200 mg (11/08/2019),  200 mg (11/29/2019), 200 mg (12/21/2019), 200 mg (01/18/2020), 200 mg (02/08/2020), 200 mg (02/29/2020), 200 mg (03/22/2020), 200 mg  (04/11/2020) fosaprepitant (EMEND) 150 mg, dexamethasone (DECADRON) 12 mg in sodium chloride 0.9 % 145 mL IVPB, , Intravenous,  Once, 4 of 4 cycles Administration:  (02/08/2019),  (04/18/2019)   for chemotherapy treatment.     07/10/2020 -  Chemotherapy    Patient is on Treatment Plan: LUNG MAINTENANCE PEMETREXED + PEMBROLIZUMAB          HISTORY OF PRESENTING ILLNESS:  Jimmy West 70 y.o.  male history of metastatic lung cancer-non-small cell-currently on maintenance Alimta-Keytruda is here for follow-up.  In the interim patient was evaluated by palliative care for anxiety/pain control.   Patient denies any worsening difficulty swallowing or pain with swallowing.  No nausea no vomiting.  No swelling the legs.  Chronic fatigue.  Review of Systems  Constitutional:  Positive for malaise/fatigue. Negative for chills, diaphoresis, fever and weight loss.  HENT:  Negative for nosebleeds and sore throat.   Eyes:  Negative for double vision.  Respiratory:  Negative for cough, hemoptysis, sputum production, shortness of breath and wheezing.   Cardiovascular:  Negative for chest pain, palpitations, orthopnea and leg swelling.  Gastrointestinal:  Negative for abdominal pain, blood in stool, constipation, diarrhea, heartburn, melena, nausea and vomiting.  Genitourinary:  Negative for dysuria, frequency and urgency.  Musculoskeletal:  Positive for back pain and joint pain.  Skin: Negative.  Negative for itching and rash.  Neurological:  Negative for dizziness, tingling, focal weakness, weakness and headaches.  Endo/Heme/Allergies:  Does not bruise/bleed easily.  Psychiatric/Behavioral:  Negative for depression. The patient is not nervous/anxious and does not have insomnia.     MEDICAL HISTORY:  Past Medical History:  Diagnosis Date  . Bulging lumbar disc   . Cancer ([place])    stage 4 lung cancer  . Heart attack [facility])     SURGICAL HISTORY: Past Surgical History:  Procedure  Laterality Date  . CARDIAC CATHETERIZATION     two stents  . KNEE SURGERY Left   . PORTA CATH INSERTION N/A 05/21/2018   Procedure: PORTA CATH INSERTION;  Surgeon: Algernon Huxley, MD;  Location: [facility];  Service: Cardiovascular;  Laterality: N/A;    SOCIAL HISTORY: Social History   Socioeconomic History  . Marital status: Single    Spouse name: Not on file  . Number of children: Not on file  . Years of education: Not on file  . Highest education level: Not on file  Occupational History  . Not on file  Tobacco Use  . Smoking status: Former    Packs/day: 1.50    Years: 15.00    Pack years: 22.50    Types: Cigarettes    Quit date: 11/03/2003    Years since quitting: 17.3  . Smokeless tobacco: Never  Vaping Use  . Vaping Use: Never used  Substance and Sexual Activity  . Alcohol use: Not Currently  . Drug use: No  . Sexual activity: Not Currently  Other Topics Concern  . Not on file  Social History Narrative  . Not on file   Social Determinants of Health   Financial Resource Strain: Not on file  Food Insecurity: Not on file  Transportation Needs: Not on file  Physical Activity: Not on file  Stress: Not on file  Social Connections: Not on file  Intimate Partner Violence: Not on file    FAMILY HISTORY: Family History  Problem Relation Age  of Onset  . Heart failure Father   . Cancer Maternal Aunt   . Heart failure Maternal Uncle   . Dementia Paternal Grandmother   . Cancer Maternal Aunt     ALLERGIES:  has No Known Allergies.  MEDICATIONS:  Current Outpatient Medications  Medication Sig Dispense Refill  . albuterol (VENTOLIN HFA) 108 (90 Base) MCG/ACT inhaler Inhale 2 puffs into the lungs every 6 (six) hours as needed for wheezing or shortness of breath. 18 g 3  . ALPRAZolam (XANAX) 1 MG tablet Take 1 tablet (1 mg total) by mouth 3 (three) times daily as needed for anxiety. 30 tablet 0  . apixaban (ELIQUIS) 5 MG TABS tablet Take 1 tablet (5 mg  total) by mouth 2 (two) times daily. 180 tablet 3  . dexamethasone (DECADRON) 4 MG tablet TAKE 1 TABLET BY MOUTH TWICE A DAY BEFORE ALIMTA CHEMO, THEN TAKE 2 TABS ONCE DAILY X 3 DAYS STARTING DAY AFTER CHEMO 30 tablet 1  . FLUoxetine (PROZAC) 40 MG capsule Take 1 capsule (40 mg total) by mouth daily. 90 capsule 1  . folic acid (FOLVITE) 1 MG tablet Take 1 tablet (1 mg total) by mouth daily. Continue until 21 days after Alimta completed. 30 tablet 3  . OLANZapine (ZYPREXA) 5 MG tablet Take 1 tablet (5 mg total) by mouth at bedtime. 30 tablet 3  . Oxycodone HCl 20 MG TABS Take 1-1.5 tablets (20-30 mg total) by mouth every 4 (four) hours as needed. 90 tablet 0  . polyethylene glycol (MIRALAX / GLYCOLAX) packet Take 17 g by mouth daily.    Marland Kitchen senna (SENOKOT) 8.6 MG tablet Take 1 tablet by mouth as needed.     . prochlorperazine (COMPAZINE) 10 MG tablet Take 1 tablet (10 mg total) by mouth every 6 (six) hours as needed for nausea or vomiting. (Patient not taking: No sig reported) 30 tablet 0   No current facility-administered medications for this visit.   Facility-Administered Medications Ordered in Other Visits  Medication Dose Route Frequency Provider Last Rate Last Admin  . 0.9 %  sodium chloride infusion   Intravenous Once Corcoran, Melissa C, MD      . 0.9 %  sodium chloride infusion   Intravenous Once PRN Corcoran, Melissa C, MD      . albuterol (PROVENTIL) (2.5 MG/3ML) 0.083% nebulizer solution 2.5 mg  2.5 mg Nebulization Once PRN Corcoran, Melissa C, MD      . alteplase (CATHFLO ACTIVASE) injection 2 mg  2 mg Intracatheter Once PRN Lequita Asal, MD      . cyanocobalamin ((VITAMIN B-12)) injection 1,000 mcg  1,000 mcg Intramuscular Once Lloyd Huger, MD      . EPINEPHrine (ADRENALIN) 1 MG/10ML injection 0.25 mg  0.25 mg Intravenous Once PRN Corcoran, Melissa C, MD      . EPINEPHrine (ADRENALIN) 1 MG/10ML injection 0.25 mg  0.25 mg Intravenous Once PRN Corcoran, Melissa C, MD       . heparin lock flush 100 unit/mL  500 Units Intracatheter Once PRN Nolon Stalls C, MD      . heparin lock flush 100 unit/mL  250 Units Intracatheter Once PRN Nolon Stalls C, MD      . heparin lock flush 100 unit/mL  500 Units Intracatheter Once PRN Lloyd Huger, MD      . sodium chloride flush (NS) 0.9 % injection 10 mL  10 mL Intravenous PRN Lequita Asal, MD   10 mL at 03/04/19 0959  .  sodium chloride flush (NS) 0.9 % injection 10 mL  10 mL Intracatheter Once PRN Corcoran, Melissa C, MD      . sodium chloride flush (NS) 0.9 % injection 3 mL  3 mL Intracatheter Once PRN Lequita Asal, MD          .  PHYSICAL EXAMINATION: ECOG PERFORMANCE STATUS: 1 - Symptomatic but completely ambulatory  Vitals:   03/19/21 0900  BP: 117/62  Pulse: 68  Resp: 18  Temp: (!) 97.3 F (36.3 C)   Filed Weights   03/19/21 0920  Weight: 137 lb 12.6 oz (62.5 kg)    Physical Exam Constitutional:      Comments: Alone.  Ambulating independently.  HENT:     Head: Normocephalic and atraumatic.     Mouth/Throat:     Pharynx: No oropharyngeal exudate.  Eyes:     Pupils: Pupils are equal, round, and reactive to light.  Cardiovascular:     Rate and Rhythm: Normal rate and regular rhythm.  Pulmonary:     Effort: No respiratory distress.     Breath sounds: No wheezing.     Comments: Decreased breath sounds bilaterally in the bases. Abdominal:     General: Bowel sounds are normal. There is no distension.     Palpations: Abdomen is soft. There is no mass.     Tenderness: There is no abdominal tenderness. There is no guarding or rebound.  Musculoskeletal:        General: No tenderness. Normal range of motion.     Cervical back: Normal range of motion and neck supple.  Skin:    General: Skin is warm.  Neurological:     Mental Status: He is alert and oriented to person, place, and time.  Psychiatric:        Mood and Affect: Affect normal.     LABORATORY DATA:  I have  reviewed the data as listed Lab Results  Component Value Date   WBC 5.3 03/19/2021   HGB 7.8 (L) 03/19/2021   HCT 25.2 (L) 03/19/2021   MCV 87.2 03/19/2021   PLT 590 (H) 03/19/2021   Recent Labs    03/21/20 0904 04/11/20 0905 06/25/20 0851 02/05/21 0827 02/26/21 0954 03/19/21 0855  NA 139 137   < > 137 136 136  K 3.6 4.3   < > 3.8 4.0 3.2*  CL 102 104   < > 102 103 101  CO2 26 22   < > 26 26 26   GLUCOSE 134* 169*   < > 105* 128* 98  BUN 17 9   < > 9 16 12   CREATININE 1.19 1.24   < > 1.21 1.17 1.16  CALCIUM 9.8 8.6*   < > 9.1 8.8* 9.0  GFRNONAA >60 59*   < > >60 >60 >60  GFRAA >60 >60  --   --   --   --   PROT 6.6 7.0   < > 7.0 6.7 6.9  ALBUMIN 3.4* 3.4*   < > 3.3* 3.0* 3.3*  AST 24 25   < > 18 18 18   ALT 13 11   < > 7 7 9   ALKPHOS 68 86   < > 70 83 66  BILITOT 0.5 0.5   < > 0.8 0.4 0.4   < > = values in this interval not displayed.    RADIOGRAPHIC STUDIES: I have personally reviewed the radiological images as listed and agreed with the findings in the report. No results found.  ASSESSMENT & PLAN:   Primary cancer of right lower lobe of lung ([place]) # Metastatic lung cancer-on keytruda+Alimta maintenance; PET scan June 2022-progressive right lower lobe hypermetabolic mass; no evidence of any distant metastatic disease or mediastinal adenopathy or osseous metastases.   # Proceed with Keytruda+ alimta. Labs today reviewed;  acceptable for treatment today.   patient will need Reatcrit/Udenyca D-2.  Hb 7.8   #Anemia-secondary chemotherapy hemoglobin 7.8-proceed with Retacrit every 3 weeks.  We will add iron studies.  # JUne 2022- Increasing size of the right upper lobe lung nodule currently undergoing SBRT [July 26th- last].  Discussed that we will plan to get a CT scan in end of sep, 2022.   #CKD stage III-GFR-GFR 60- STABLE.   #Back pain/bone mets-continue current oxycodone; Zometa every 3 months; Zometa 3.5 next visit  Calcium normal stable  #Intermittent  difficulty swallowing- ? RT- imporved; monitor for now.    # Anxiety and depression: Stable;  Xanax as well as Prozac           #  Pulmonary embolism:  Continue Eliquis.  Stable.  # Chemotherapy induced anemia: on retacrit hemoglobin around 7.4 proceed with Retacrit.            # DISPOSITION: # ALimta today; Beryle Flock; Retacrit today;   # Needs- udenyca ppt for tomorrow.  # follow up in 3 weeks- [facility] with diff, CMP, Mg], Alimta and pembrolizumab;Zometa- retacrit;  D-2 Ellen Henri- Dr.B         All questions were answered. The patient knows to call the clinic with any problems, questions or concerns.       Cammie Sickle, MD 03/19/2021 9:44 AM

## 2021-03-20 ENCOUNTER — Ambulatory Visit: Payer: Medicare HMO

## 2021-03-20 ENCOUNTER — Inpatient Hospital Stay: Payer: Medicare HMO

## 2021-03-20 ENCOUNTER — Ambulatory Visit
Admission: RE | Admit: 2021-03-20 | Discharge: 2021-03-20 | Disposition: A | Discharge status: Home / Self Care | Payer: Medicare HMO | Source: Ambulatory Visit | Attending: Radiation Oncology | Admitting: Radiation Oncology

## 2021-03-20 VITALS — BP 99/64 | HR 97 | Temp 98.2°F | Resp 20 | Wt 134.0 lb

## 2021-03-20 VITALS — BP 104/69 | HR 98 | Temp 98.0°F | Resp 18

## 2021-03-20 DIAGNOSIS — C3431 Malignant neoplasm of lower lobe, right bronchus or lung: Secondary | ICD-10-CM | POA: Insufficient documentation

## 2021-03-20 DIAGNOSIS — Z87891 Personal history of nicotine dependence: Secondary | ICD-10-CM | POA: Insufficient documentation

## 2021-03-20 DIAGNOSIS — N1831 Chronic kidney disease, stage 3a: Secondary | ICD-10-CM | POA: Diagnosis not present

## 2021-03-20 DIAGNOSIS — C7951 Secondary malignant neoplasm of bone: Secondary | ICD-10-CM | POA: Diagnosis not present

## 2021-03-20 DIAGNOSIS — Z923 Personal history of irradiation: Secondary | ICD-10-CM | POA: Insufficient documentation

## 2021-03-20 DIAGNOSIS — Z79899 Other long term (current) drug therapy: Secondary | ICD-10-CM | POA: Diagnosis not present

## 2021-03-20 DIAGNOSIS — D6481 Anemia due to antineoplastic chemotherapy: Secondary | ICD-10-CM | POA: Diagnosis not present

## 2021-03-20 MED ORDER — PEGFILGRASTIM-CBQV 6 MG/0.6ML ~~LOC~~ SOSY
6.0000 mg | PREFILLED_SYRINGE | Freq: Once | SUBCUTANEOUS | Status: AC
  Administered 2021-03-20: 6 mg via SUBCUTANEOUS

## 2021-03-20 NOTE — Progress Notes (Signed)
Radiation Oncology Follow up Note  Name: Jimmy West   Date:   03/20/2021 MRN:  [id number] DOB: [birth year]    This 70 y.o. male presents to the clinic today for 1 month follow-up.  Status post SBRT to right lower lobe lesion and patient with known stage IV lung cancer previously treated to his thoracic spine back in 2019  REFERRING PROVIDER: No ref. provider found  HPI: Patient is a 70 year old male now out 1 month having completed SBRT for progressive right lower lobe lesion adenocarcinoma he is known stage IV disease previously treated with palliative radiation therapy to his thoracic spine back in 2019.  He is seen today in routine follow-up and is doing well.Marland Kitchen  He is currently on Keytruda Alimta maintenance.  He is tolerating that well without significant side effect.  COMPLICATIONS OF TREATMENT: none  FOLLOW UP COMPLIANCE: keeps appointments   PHYSICAL EXAM:  BP 99/64   Pulse 97   Temp 98.2 F (36.8 C) (Tympanic)   Resp 20   Wt 134 lb (60.8 kg)   BMI 21.63 kg/m  Well-developed well-nourished patient in NAD. HEENT reveals PERLA, EOMI, discs not visualized.  Oral cavity is clear. No oral mucosal lesions are identified. Neck is clear without evidence of cervical or supraclavicular adenopathy. Lungs are clear to A&P. Cardiac examination is essentially unremarkable with regular rate and rhythm without murmur rub or thrill. Abdomen is benign with no organomegaly or masses noted. Motor sensory and DTR levels are equal and symmetric in the upper and lower extremities. Cranial nerves II through XII are grossly intact. Proprioception is intact. No peripheral adenopathy or edema is identified. No motor or sensory levels are noted. Crude visual fields are within normal range.  RADIOLOGY RESULTS: No current films to review  PLAN: Present time patient is recovering nicely from SBRT with minimal side effect profile.  And pleased with his overall progress.  He continues on maintenance  immunotherapy with Dr. B.  I have asked to see him back in 4 months.  I believe he is scheduled for CT scan in 3 months and we will review that at that time.  Be happy to reevaluate the patient anytime should treatment be indicated.  Patient knows to call with any concerns.  I would like to take this opportunity to thank you for allowing me to participate in the care of your patient.Noreene Filbert, MD

## 2021-03-26 ENCOUNTER — Other Ambulatory Visit: Payer: Self-pay | Admitting: *Deleted

## 2021-03-26 DIAGNOSIS — G893 Neoplasm related pain (acute) (chronic): Secondary | ICD-10-CM

## 2021-03-26 MED ORDER — ALPRAZOLAM 1 MG PO TABS: 1.0000 mg | ORAL_TABLET | Freq: Three times a day (TID) | ORAL | 0 refills | Status: DC | PRN

## 2021-03-26 MED ORDER — OXYCODONE HCL 20 MG PO TABS: 20.0000 mg | ORAL_TABLET | ORAL | 0 refills | Status: DC | PRN

## 2021-03-26 NOTE — Telephone Encounter (Signed)
Ronalee Belts left me a message as well to request refill of xanax in addition to pt's oxycodone.

## 2021-03-26 NOTE — Telephone Encounter (Addendum)
Received a call from Julaine Hua a friend of patient asking to be called back to discuss problem patient is having. Mr king was not on patient list of people to speak with so I called patient and he asked that I add Mer king to list because he hardly ever sees [place] any more. I have added Mr Edison Pace to list and called him back and was told that patient is confused about his medications how to take and when they could be refilled. I went over his medications specifically Oxycodone, Alprazolam and Prozac. Patient is in need of his Oxycodone refill. Mr king also expressed concern over the fact that he is concerned patient may be showing signs of dementia, but not to the point that he is a danger to himself

## 2021-04-03 ENCOUNTER — Telehealth: Payer: Self-pay | Admitting: Primary Care

## 2021-04-03 ENCOUNTER — Inpatient Hospital Stay: Payer: Medicare HMO | Attending: Internal Medicine | Admitting: Hospice and Palliative Medicine

## 2021-04-03 DIAGNOSIS — Z86711 Personal history of pulmonary embolism: Secondary | ICD-10-CM | POA: Insufficient documentation

## 2021-04-03 DIAGNOSIS — N183 Chronic kidney disease, stage 3 unspecified: Secondary | ICD-10-CM | POA: Insufficient documentation

## 2021-04-03 DIAGNOSIS — Z79899 Other long term (current) drug therapy: Secondary | ICD-10-CM | POA: Insufficient documentation

## 2021-04-03 DIAGNOSIS — C3431 Malignant neoplasm of lower lobe, right bronchus or lung: Secondary | ICD-10-CM

## 2021-04-03 DIAGNOSIS — F419 Anxiety disorder, unspecified: Secondary | ICD-10-CM | POA: Insufficient documentation

## 2021-04-03 DIAGNOSIS — M549 Dorsalgia, unspecified: Secondary | ICD-10-CM | POA: Insufficient documentation

## 2021-04-03 DIAGNOSIS — Z5112 Encounter for antineoplastic immunotherapy: Secondary | ICD-10-CM | POA: Insufficient documentation

## 2021-04-03 DIAGNOSIS — Z7901 Long term (current) use of anticoagulants: Secondary | ICD-10-CM | POA: Insufficient documentation

## 2021-04-03 DIAGNOSIS — Z9221 Personal history of antineoplastic chemotherapy: Secondary | ICD-10-CM | POA: Insufficient documentation

## 2021-04-03 DIAGNOSIS — F32A Depression, unspecified: Secondary | ICD-10-CM | POA: Insufficient documentation

## 2021-04-03 DIAGNOSIS — J449 Chronic obstructive pulmonary disease, unspecified: Secondary | ICD-10-CM | POA: Insufficient documentation

## 2021-04-03 DIAGNOSIS — D6481 Anemia due to antineoplastic chemotherapy: Secondary | ICD-10-CM | POA: Insufficient documentation

## 2021-04-03 MED ORDER — OLANZAPINE 10 MG PO TABS: 10.0000 mg | ORAL_TABLET | Freq: Every day | ORAL | 2 refills | Status: AC

## 2021-04-03 NOTE — Progress Notes (Signed)
Virtual Visit via Telephone Note  I connected with Jimmy West on 04/03/21 at 10:30 AM EDT by telephone and verified that I am speaking with the correct person using two identifiers.   I discussed the limitations, risks, security and privacy concerns of performing an evaluation and management service by telephone and the availability of in person appointments. I also discussed with the patient that there may be a patient responsible charge related to this service. The patient expressed understanding and agreed to proceed.   History of Present Illness: Mr. 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  Patient has had chronic neoplasm related pain and severe anxiety/depression.  He was referred to palliative care clinic to assist with symptom management and to establish treatment goals.    Observations/Objective: Follow-up virtual visit  Patient's friend had voiced some concerns the patient was not doing well.  I spoke with patient by phone today and he admits to feeling "down" and depressed.  He says that he is functionally doing just fine and able to care for himself but does not feel like he has mental energy due to worsening depression.  He denies SI/HI.  No hallucinations reported.  Patient continues to take Prozac daily.  It is unclear if he was taking olanzapine, which was prescribed to help with nausea/sleep/depression.  However, patient says that he needs olanzapine refilled and is willing to take it nightly.  We will plan to increase the dose of olanzapine to 10 mg nightly.  Fluoxetine and olanzapine have been studied in combination to help with treatment resistant depression.  I have tried multiple times to refer patient to psychiatry but patient will never keep the appointment after agreeing to go.  I once again talked to him about my recommendation to see a psychiatrist and he again tells me  that he will go.  We will try again to send a referral.  We will also refer him again to palliative care at home.  Assessment and Plan: Stage IV lung cancer -on treatment with immunotherapy.  Referral for home palliative care  Neoplasm related pain -continue oxycodone 20 to 30 mg every 4 hours as needed for breakthrough pain  Anxiety -alprazolam 1 mg every 8 hours as needed  Depression -continue Prozac 40mg  daily and increase olanzapine to 10 mg at night.  Referral for psychiatry  Constipation -continue daily senna and MiraLAX  Follow Up Instructions: Follow-up MyChart visit 1-2 months   I discussed the assessment and treatment plan with the patient. The patient was provided an opportunity to ask questions and all were answered. The patient agreed with the plan and demonstrated an understanding of the instructions.   The patient was advised to call back or seek an in-person evaluation if the symptoms worsen or if the condition fails to improve as anticipated.  I provided 10 minutes of non-face-to-face time during this encounter.   Irean Hong, NP

## 2021-04-03 NOTE — Telephone Encounter (Signed)
Attempted to contact patient to offer to schedule a Palliative f/u visit, no answer and I was unable to leave a message due to mailbox was full.  I then sent patient a MyChart message requesting a call to schedule visit.

## 2021-04-04 ENCOUNTER — Inpatient Hospital Stay: Payer: Medicare HMO

## 2021-04-04 ENCOUNTER — Other Ambulatory Visit: Payer: Self-pay

## 2021-04-04 ENCOUNTER — Other Ambulatory Visit: Payer: Self-pay | Admitting: *Deleted

## 2021-04-04 DIAGNOSIS — G893 Neoplasm related pain (acute) (chronic): Secondary | ICD-10-CM

## 2021-04-04 MED ORDER — OXYCODONE HCL 20 MG PO TABS: 20.0000 mg | ORAL_TABLET | ORAL | 0 refills | Status: DC | PRN

## 2021-04-04 MED ORDER — CYANOCOBALAMIN 1000 MCG/ML IJ SOLN: 1000.0000 ug | Freq: Once | INTRAMUSCULAR | Status: DC

## 2021-04-04 NOTE — Telephone Encounter (Signed)
Pt left message requesting refill of oxycodone which is due for refill tomorrow, 9/16.

## 2021-04-08 ENCOUNTER — Telehealth: Payer: Self-pay | Admitting: Primary Care

## 2021-04-08 NOTE — Telephone Encounter (Signed)
Attempted to contact patient again to schedule a Palliative f/u visit, no answer and I left VM requesting a return call to let us know if he wishes to pursue Palliative services or not, just so that we can let the [facility] know, because we have rec'd a second referral from them but we had never discharged him from the original referral.  I left my name and contact information for him to call me back at.    I also called and spoke with patient's friend [name] to let her know that I have unable to reach the patient and we need to know if he wants to continue with our services or not.  [name] said she would get in touch with patient and either she or patient would call me back to let me know what he wishes to do.

## 2021-04-09 ENCOUNTER — Inpatient Hospital Stay: Payer: Medicare HMO

## 2021-04-09 ENCOUNTER — Other Ambulatory Visit: Payer: Self-pay

## 2021-04-09 ENCOUNTER — Inpatient Hospital Stay (HOSPITAL_BASED_OUTPATIENT_CLINIC_OR_DEPARTMENT_OTHER): Payer: Medicare HMO | Admitting: Internal Medicine

## 2021-04-09 DIAGNOSIS — Z86711 Personal history of pulmonary embolism: Secondary | ICD-10-CM | POA: Diagnosis not present

## 2021-04-09 DIAGNOSIS — C3431 Malignant neoplasm of lower lobe, right bronchus or lung: Secondary | ICD-10-CM

## 2021-04-09 DIAGNOSIS — F419 Anxiety disorder, unspecified: Secondary | ICD-10-CM | POA: Diagnosis not present

## 2021-04-09 DIAGNOSIS — F32A Depression, unspecified: Secondary | ICD-10-CM | POA: Diagnosis not present

## 2021-04-09 DIAGNOSIS — N183 Chronic kidney disease, stage 3 unspecified: Secondary | ICD-10-CM | POA: Diagnosis not present

## 2021-04-09 DIAGNOSIS — Z79899 Other long term (current) drug therapy: Secondary | ICD-10-CM | POA: Diagnosis not present

## 2021-04-09 DIAGNOSIS — Z5112 Encounter for antineoplastic immunotherapy: Secondary | ICD-10-CM | POA: Diagnosis present

## 2021-04-09 DIAGNOSIS — Z7901 Long term (current) use of anticoagulants: Secondary | ICD-10-CM | POA: Diagnosis not present

## 2021-04-09 DIAGNOSIS — D6481 Anemia due to antineoplastic chemotherapy: Secondary | ICD-10-CM | POA: Diagnosis not present

## 2021-04-09 DIAGNOSIS — Z9221 Personal history of antineoplastic chemotherapy: Secondary | ICD-10-CM | POA: Diagnosis not present

## 2021-04-09 DIAGNOSIS — M549 Dorsalgia, unspecified: Secondary | ICD-10-CM | POA: Diagnosis not present

## 2021-04-09 DIAGNOSIS — R69 Illness, unspecified: Secondary | ICD-10-CM | POA: Diagnosis not present

## 2021-04-09 DIAGNOSIS — C7951 Secondary malignant neoplasm of bone: Secondary | ICD-10-CM

## 2021-04-09 DIAGNOSIS — J449 Chronic obstructive pulmonary disease, unspecified: Secondary | ICD-10-CM | POA: Diagnosis not present

## 2021-04-09 LAB — CBC WITH DIFFERENTIAL/PLATELET
Abs Immature Granulocytes: 0.5 10*3/uL — ABNORMAL HIGH (ref 0.00–0.07)
Basophils Absolute: 0.1 10*3/uL (ref 0.0–0.1)
Basophils Relative: 0 %
Eosinophils Absolute: 0.1 10*3/uL (ref 0.0–0.5)
Eosinophils Relative: 1 %
HCT: 24.2 % — ABNORMAL LOW (ref 39.0–52.0)
Hemoglobin: 7.5 g/dL — ABNORMAL LOW (ref 13.0–17.0)
Immature Granulocytes: 2 %
Lymphocytes Relative: 4 %
Lymphs Abs: 0.8 10*3/uL (ref 0.7–4.0)
MCH: 28.5 pg (ref 26.0–34.0)
MCHC: 31 g/dL (ref 30.0–36.0)
MCV: 92 fL (ref 80.0–100.0)
Monocytes Absolute: 2.2 10*3/uL — ABNORMAL HIGH (ref 0.1–1.0)
Monocytes Relative: 10 %
Neutro Abs: 19 10*3/uL — ABNORMAL HIGH (ref 1.7–7.7)
Neutrophils Relative %: 83 %
Platelets: 325 10*3/uL (ref 150–400)
RBC: 2.63 MIL/uL — ABNORMAL LOW (ref 4.22–5.81)
RDW: 23.9 % — ABNORMAL HIGH (ref 11.5–15.5)
WBC: 22.7 10*3/uL — ABNORMAL HIGH (ref 4.0–10.5)
nRBC: 0 % (ref 0.0–0.2)

## 2021-04-09 LAB — COMPREHENSIVE METABOLIC PANEL
ALT: 7 U/L (ref 0–44)
AST: 16 U/L (ref 15–41)
Albumin: 3.2 g/dL — ABNORMAL LOW (ref 3.5–5.0)
Alkaline Phosphatase: 94 U/L (ref 38–126)
Anion gap: 7 (ref 5–15)
BUN: 16 mg/dL (ref 8–23)
CO2: 25 mmol/L (ref 22–32)
Calcium: 8.5 mg/dL — ABNORMAL LOW (ref 8.9–10.3)
Chloride: 105 mmol/L (ref 98–111)
Creatinine, Ser: 1.45 mg/dL — ABNORMAL HIGH (ref 0.61–1.24)
GFR, Estimated: 52 mL/min — ABNORMAL LOW (ref >60)
Glucose, Bld: 100 mg/dL — ABNORMAL HIGH (ref 70–99)
Potassium: 3.2 mmol/L — ABNORMAL LOW (ref 3.5–5.1)
Sodium: 137 mmol/L (ref 135–145)
Total Bilirubin: 0.6 mg/dL (ref 0.3–1.2)
Total Protein: 6.5 g/dL (ref 6.5–8.1)

## 2021-04-09 LAB — MAGNESIUM: Magnesium: 1.4 mg/dL — ABNORMAL LOW (ref 1.7–2.4)

## 2021-04-09 MED ORDER — SODIUM CHLORIDE 0.9 % IV SOLN
Freq: Once | INTRAVENOUS | Status: DC
  Filled 2021-04-09: qty 250

## 2021-04-09 MED ORDER — FILGRASTIM-SNDZ 300 MCG/0.5ML IJ SOSY: 300.0000 ug | PREFILLED_SYRINGE | Freq: Once | INTRAMUSCULAR | Status: DC

## 2021-04-09 MED ORDER — PROCHLORPERAZINE EDISYLATE 10 MG/2ML IJ SOLN
10.0000 mg | Freq: Once | INTRAMUSCULAR | Status: DC
  Filled 2021-04-09: qty 2

## 2021-04-09 MED ORDER — HEPARIN SOD (PORK) LOCK FLUSH 100 UNIT/ML IV SOLN
500.0000 [IU] | Freq: Once | INTRAVENOUS | Status: AC | PRN
  Administered 2021-04-09: 500 [IU]
  Filled 2021-04-09: qty 5

## 2021-04-09 MED ORDER — PROCHLORPERAZINE MALEATE 10 MG PO TABS
10.0000 mg | ORAL_TABLET | Freq: Once | ORAL | Status: AC
  Administered 2021-04-09: 10 mg via ORAL
  Filled 2021-04-09: qty 1

## 2021-04-09 MED ORDER — SODIUM CHLORIDE 0.9 % IV SOLN
700.0000 mg | Freq: Once | INTRAVENOUS | Status: AC
  Administered 2021-04-09: 700 mg via INTRAVENOUS
  Filled 2021-04-09: qty 28

## 2021-04-09 MED ORDER — SODIUM CHLORIDE 0.9 % IV SOLN
Freq: Once | INTRAVENOUS | Status: AC
  Filled 2021-04-09: qty 250

## 2021-04-09 MED ORDER — SODIUM CHLORIDE 0.9 % IV SOLN
200.0000 mg | Freq: Once | INTRAVENOUS | Status: AC
  Administered 2021-04-09: 200 mg via INTRAVENOUS
  Filled 2021-04-09: qty 8

## 2021-04-09 MED ORDER — ONDANSETRON HCL 4 MG/2ML IJ SOLN
4.0000 mg | Freq: Once | INTRAMUSCULAR | Status: AC
  Administered 2021-04-09: 4 mg via INTRAVENOUS
  Filled 2021-04-09: qty 2

## 2021-04-09 MED ORDER — ZOLEDRONIC ACID 4 MG/100ML IV SOLN: 4.0000 mg | Freq: Once | INTRAVENOUS | Status: DC

## 2021-04-09 MED ORDER — EPOETIN ALFA-EPBX 10000 UNIT/ML IJ SOLN
10000.0000 [IU] | Freq: Once | INTRAMUSCULAR | Status: AC
  Administered 2021-04-09: 10000 [IU] via SUBCUTANEOUS
  Filled 2021-04-09: qty 1

## 2021-04-09 MED ORDER — HEPARIN SOD (PORK) LOCK FLUSH 100 UNIT/ML IV SOLN
INTRAVENOUS | Status: AC
  Filled 2021-04-09: qty 5

## 2021-04-09 MED ORDER — ALBUTEROL SULFATE HFA 108 (90 BASE) MCG/ACT IN AERS: 2.0000 | INHALATION_SPRAY | Freq: Four times a day (QID) | RESPIRATORY_TRACT | 3 refills | Status: AC | PRN

## 2021-04-09 NOTE — Progress Notes (Signed)
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   *INTOLERANT to carboplatin at an AUC of 5 as well as 2. # NGS/MOLECULAR TESTS:Foundation One revealed no driver mutations  # PALLIATIVE CARE EVALUATION:  # PAIN MANAGEMENT:    DIAGNOSIS:   STAGE:         ;  GOALS:  CURRENT/MOST RECENT THERAPY :     Primary cancer of right lower lobe of lung ([place])  04/26/2018 Initial Diagnosis   Primary cancer of right lower lobe of lung ([place])   05/24/2018 - 01/10/2019 Chemotherapy   The patient had dexamethasone (DECADRON) 4 MG tablet, 1 of 1 cycle, Start date: 05/17/2018, End date: 08/12/2018 pembrolizumab (KEYTRUDA) 200 mg in sodium chloride 0.9 % 50 mL chemo infusion, 200 mg, Intravenous, Once, 11 of 14 cycles Administration: 200 mg (05/24/2018), 200 mg (07/16/2018), 200 mg (08/09/2018), 200 mg (08/30/2018), 200 mg (09/20/2018), 200 mg (06/14/2018), 200 mg (10/11/2018), 200 mg (11/08/2018), 200 mg (11/29/2018), 200 mg (12/20/2018), 200 mg (01/10/2019)   for chemotherapy treatment.     02/08/2019 - 04/12/2020 Chemotherapy   The patient had dexamethasone (DECADRON) 4 MG tablet, 1 of 1 cycle, Start date: 04/23/2020, End date: -- palonosetron (ALOXI) injection 0.25 mg, 0.25 mg, Intravenous,  Once, 21 of 23 cycles Administration: 0.25 mg (02/08/2019), 0.25 mg (03/29/2019), 0.25 mg (04/18/2019), 0.25 mg (02/28/2019), 0.25 mg (05/09/2019), 0.25 mg  (05/30/2019), 0.25 mg (06/27/2019), 0.25 mg (07/18/2019), 0.25 mg (08/08/2019), 0.25 mg (08/30/2019), 0.25 mg (09/20/2019), 0.25 mg (10/11/2019), 0.25 mg (11/08/2019), 0.25 mg (11/29/2019), 0.25 mg (12/21/2019), 0.25 mg (01/18/2020), 0.25 mg (02/08/2020), 0.25 mg (02/29/2020), 0.25 mg (03/22/2020), 0.25 mg (04/11/2020) pegfilgrastim-jmdb (FULPHILA) injection 6 mg, 6 mg, Subcutaneous,  Once, 4 of 6 cycles Administration: 6 mg (03/23/2020), 6 mg (04/12/2020) PEMEtrexed (ALIMTA) 900 mg in sodium chloride 0.9 % 100 mL chemo infusion, 500 mg/m2 = 900 mg, Intravenous,  Once, 21 of 23 cycles Dose modification: 400 mg/m2 (original dose 500 mg/m2, Cycle 14, Reason: Provider Judgment, Comment: change in reanl function, although CrCl > 45 ml/min) Administration: 900 mg (02/08/2019), 900 mg (03/29/2019), 900 mg (04/18/2019), 900 mg (05/09/2019), 900 mg (05/30/2019), 900 mg (02/28/2019), 900 mg (06/27/2019), 900 mg (07/18/2019), 900 mg (08/08/2019), 900 mg (08/30/2019), 900 mg (09/20/2019), 900 mg (10/11/2019), 900 mg (11/08/2019), 700 mg (11/29/2019), 700 mg (01/18/2020), 700 mg (02/08/2020), 700 mg (02/29/2020), 700 mg (12/21/2019), 700 mg (03/22/2020), 700 mg (04/11/2020) CARBOplatin (PARAPLATIN) 500 mg in sodium chloride 0.9 % 250 mL chemo infusion, 470 mg (100 % of original dose 472 mg), Intravenous,  Once, 2 of 2 cycles Dose modification:   (original dose 472 mg, Cycle 1) Administration: 500 mg (02/08/2019), 190 mg (04/18/2019) pembrolizumab (KEYTRUDA) 200 mg in sodium chloride 0.9 % 50 mL chemo infusion, 200 mg, Intravenous, Once, 21 of 23 cycles Administration: 200 mg (02/08/2019), 200 mg (03/29/2019), 200 mg (04/18/2019), 200 mg (05/09/2019), 200 mg (05/30/2019), 200 mg (02/28/2019), 200 mg (06/27/2019), 200 mg (07/18/2019), 200 mg (08/08/2019), 200 mg (08/30/2019), 200 mg (09/20/2019), 200 mg (10/11/2019), 200 mg (11/08/2019),  200 mg (11/29/2019), 200 mg (12/21/2019), 200 mg (01/18/2020), 200 mg (02/08/2020), 200 mg (02/29/2020), 200 mg (03/22/2020), 200 mg  (04/11/2020) fosaprepitant (EMEND) 150 mg, dexamethasone (DECADRON) 12 mg in sodium chloride 0.9 % 145 mL IVPB, , Intravenous,  Once, 4 of 4 cycles Administration:  (02/08/2019),  (04/18/2019)   for chemotherapy treatment.     07/10/2020 -  Chemotherapy    Patient is on Treatment Plan: LUNG MAINTENANCE PEMETREXED + PEMBROLIZUMAB          HISTORY OF PRESENTING ILLNESS:  Jimmy West 70 y.o.  male history of metastatic lung cancer-non-small cell-currently on maintenance Alimta-Keytruda is here for follow-up.  Chronic mild swelling of the legs.  No nausea vomiting.  Chronic fatigue.  Chronic joint pain back pain stable  Review of Systems  Constitutional:  Positive for malaise/fatigue. Negative for chills, diaphoresis, fever and weight loss.  HENT:  Negative for nosebleeds and sore throat.   Eyes:  Negative for double vision.  Respiratory:  Negative for cough, hemoptysis, sputum production, shortness of breath and wheezing.   Cardiovascular:  Negative for chest pain, palpitations, orthopnea and leg swelling.  Gastrointestinal:  Negative for abdominal pain, blood in stool, constipation, diarrhea, heartburn, melena, nausea and vomiting.  Genitourinary:  Negative for dysuria, frequency and urgency.  Musculoskeletal:  Positive for back pain and joint pain.  Skin: Negative.  Negative for itching and rash.  Neurological:  Negative for dizziness, tingling, focal weakness, weakness and headaches.  Endo/Heme/Allergies:  Does not bruise/bleed easily.  Psychiatric/Behavioral:  Negative for depression. The patient is not nervous/anxious and does not have insomnia.     MEDICAL HISTORY:  Past Medical History:  Diagnosis Date   Bulging lumbar disc    Cancer ([place])    stage 4 lung cancer   Heart attack ([place])     SURGICAL HISTORY: Past Surgical History:  Procedure Laterality Date   CARDIAC CATHETERIZATION     two stents   KNEE SURGERY Left    PORTA CATH INSERTION N/A 05/21/2018    Procedure: PORTA CATH INSERTION;  Surgeon: Algernon Huxley, MD;  Location: [facility];  Service: Cardiovascular;  Laterality: N/A;    SOCIAL HISTORY: Social History   Socioeconomic History   Marital status: Single    Spouse name: Not on file   Number of children: Not on file   Years of education: Not on file   Highest education level: Not on file  Occupational History   Not on file  Tobacco Use   Smoking status: Former    Packs/day: 1.50    Years: 15.00    Pack years: 22.50    Types: Cigarettes    Quit date: 11/03/2003    Years since quitting: 17.4   Smokeless tobacco: Never  Vaping Use   Vaping Use: Never used  Substance and Sexual Activity   Alcohol use: Not Currently   Drug use: No   Sexual activity: Not Currently  Other Topics Concern   Not on file  Social History Narrative   Not on file   Social Determinants of Health   Financial Resource Strain: Not on file  Food Insecurity: Not on file  Transportation Needs: Not on file  Physical Activity: Not on file  Stress: Not on file  Social Connections: Not on file  Intimate Partner Violence: Not on file    FAMILY HISTORY: Family History  Problem Relation Age of Onset   Heart failure Father    Cancer Maternal Aunt    Heart  failure Maternal Uncle    Dementia Paternal Grandmother    Cancer Maternal Aunt     ALLERGIES:  has No Known Allergies.  MEDICATIONS:  Current Outpatient Medications  Medication Sig Dispense Refill   ALPRAZolam (XANAX) 1 MG tablet Take 1 tablet (1 mg total) by mouth 3 (three) times daily as needed for anxiety. 90 tablet 0   apixaban (ELIQUIS) 5 MG TABS tablet Take 1 tablet (5 mg total) by mouth 2 (two) times daily. 180 tablet 3   dexamethasone (DECADRON) 4 MG tablet TAKE 1 TABLET BY MOUTH TWICE A DAY BEFORE ALIMTA CHEMO, THEN TAKE 2 TABS ONCE DAILY X 3 DAYS STARTING DAY AFTER CHEMO 30 tablet 1   FLUoxetine (PROZAC) 40 MG capsule Take 1 capsule (40 mg total) by mouth daily. 90 capsule  1   folic acid (FOLVITE) 1 MG tablet Take 1 tablet (1 mg total) by mouth daily. Continue until 21 days after Alimta completed. 30 tablet 3   OLANZapine (ZYPREXA) 10 MG tablet Take 1 tablet (10 mg total) by mouth at bedtime. 30 tablet 2   Oxycodone HCl 20 MG TABS Take 1-1.5 tablets (20-30 mg total) by mouth every 4 (four) hours as needed. 90 tablet 0   polyethylene glycol (MIRALAX / GLYCOLAX) packet Take 17 g by mouth daily.     prochlorperazine (COMPAZINE) 10 MG tablet Take 1 tablet (10 mg total) by mouth every 6 (six) hours as needed for nausea or vomiting. 30 tablet 0   senna (SENOKOT) 8.6 MG tablet Take 1 tablet by mouth as needed.      albuterol (VENTOLIN HFA) 108 (90 Base) MCG/ACT inhaler Inhale 2 puffs into the lungs every 6 (six) hours as needed for wheezing or shortness of breath. 18 g 3   No current facility-administered medications for this visit.   Facility-Administered Medications Ordered in Other Visits  Medication Dose Route Frequency Provider Last Rate Last Admin   0.9 %  sodium chloride infusion   Intravenous Once Corcoran, Melissa C, MD       0.9 %  sodium chloride infusion   Intravenous Once PRN Corcoran, Melissa C, MD       0.9 %  sodium chloride infusion   Intravenous Continuous Cammie Sickle, MD   Stopped at 03/19/21 1151   0.9 %  sodium chloride infusion   Intravenous Once Charlaine Dalton R, MD       albuterol (PROVENTIL) (2.5 MG/3ML) 0.083% nebulizer solution 2.5 mg  2.5 mg Nebulization Once PRN Lequita Asal, MD       alteplase (CATHFLO ACTIVASE) injection 2 mg  2 mg Intracatheter Once PRN Lequita Asal, MD       cyanocobalamin ((VITAMIN B-12)) injection 1,000 mcg  1,000 mcg Intramuscular Once Lloyd Huger, MD       EPINEPHrine (ADRENALIN) 1 MG/10ML injection 0.25 mg  0.25 mg Intravenous Once PRN Lequita Asal, MD       EPINEPHrine (ADRENALIN) 1 MG/10ML injection 0.25 mg  0.25 mg Intravenous Once PRN Corcoran, Melissa C, MD        heparin lock flush 100 UNIT/ML injection            heparin lock flush 100 unit/mL  500 Units Intracatheter Once PRN Nolon Stalls C, MD       heparin lock flush 100 unit/mL  250 Units Intracatheter Once PRN Lequita Asal, MD       heparin lock flush 100 unit/mL  500 Units Intracatheter Once PRN Grayland Ormond,  Kathlene November, MD       heparin lock flush 100 unit/mL  500 Units Intracatheter Once PRN Cammie Sickle, MD       sodium chloride flush (NS) 0.9 % injection 10 mL  10 mL Intravenous PRN Nolon Stalls C, MD   10 mL at 03/04/19 0959   sodium chloride flush (NS) 0.9 % injection 10 mL  10 mL Intracatheter Once PRN Lequita Asal, MD       sodium chloride flush (NS) 0.9 % injection 3 mL  3 mL Intracatheter Once PRN Lequita Asal, MD          .  PHYSICAL EXAMINATION: ECOG PERFORMANCE STATUS: 1 - Symptomatic but completely ambulatory  Vitals:   04/09/21 0847  BP: 109/79  Pulse: 90  Resp: 20  Temp: 98.7 F (37.1 C)   Filed Weights   04/09/21 0847  Weight: 137 lb 2 oz (62.2 kg)    Physical Exam Constitutional:      Comments: Alone.  Ambulating independently.  HENT:     Head: Normocephalic and atraumatic.     Mouth/Throat:     Pharynx: No oropharyngeal exudate.  Eyes:     Pupils: Pupils are equal, round, and reactive to light.  Cardiovascular:     Rate and Rhythm: Normal rate and regular rhythm.  Pulmonary:     Effort: No respiratory distress.     Breath sounds: No wheezing.     Comments: Decreased breath sounds bilaterally in the bases. Abdominal:     General: Bowel sounds are normal. There is no distension.     Palpations: Abdomen is soft. There is no mass.     Tenderness: There is no abdominal tenderness. There is no guarding or rebound.  Musculoskeletal:        General: No tenderness. Normal range of motion.     Cervical back: Normal range of motion and neck supple.  Skin:    General: Skin is warm.  Neurological:     Mental Status: He is  alert and oriented to person, place, and time.  Psychiatric:        Mood and Affect: Affect normal.     LABORATORY DATA:  I have reviewed the data as listed Lab Results  Component Value Date   WBC 22.7 (H) 04/09/2021   HGB 7.5 (L) 04/09/2021   HCT 24.2 (L) 04/09/2021   MCV 92.0 04/09/2021   PLT 325 04/09/2021   Recent Labs    04/11/20 0905 06/25/20 0851 02/26/21 0954 03/19/21 0855 04/09/21 0830  NA 137   < > 136 136 137  K 4.3   < > 4.0 3.2* 3.2*  CL 104   < > 103 101 105  CO2 22   < > 26 26 25   GLUCOSE 169*   < > 128* 98 100*  BUN 9   < > 16 12 16   CREATININE 1.24   < > 1.17 1.16 1.45*  CALCIUM 8.6*   < > 8.8* 9.0 8.5*  GFRNONAA 59*   < > >60 >60 52*  GFRAA >60  --   --   --   --   PROT 7.0   < > 6.7 6.9 6.5  ALBUMIN 3.4*   < > 3.0* 3.3* 3.2*  AST 25   < > 18 18 16   ALT 11   < > 7 9 7   ALKPHOS 86   < > 83 66 94  BILITOT 0.5   < > 0.4 0.4  0.6   < > = values in this interval not displayed.    RADIOGRAPHIC STUDIES: I have personally reviewed the radiological images as listed and agreed with the findings in the report. No results found.  ASSESSMENT & PLAN:   Primary cancer of right lower lobe of lung ([place]) # Metastatic lung cancer-on keytruda+Alimta maintenance; PET scan June 2022-progressive right lower lobe hypermetabolic mass; no evidence of any distant metastatic disease or mediastinal adenopathy or osseous metastases.  Currently undergoing SBRT [July 26th- last]. Will order repeat scan at next visit.   # Proceed with Keytruda+ alimta. Labs today reviewed;  acceptable for treatment today.   patient will need Reatcrit; cancel Udenyca- tomorrow WBC 23.  Hb 7.5.   # Hypoklaemia- 3.2; discussed dietary; declines Kdur for now.    #Anemia-secondary chemotherapy hemoglobin 7.5-proceed with Retacrit every 3 weeks.  We will add iron studies.  #CKD stage III-GFR-GFR52- STABLE.   #Back pain/bone mets-continue current oxycodone; Zometa every 3 months; Zometa 3.5 next  visit  Calcium- 8.5.   #Intermittent difficulty swallowing- ? RT- imporved; monitor for now.    # Anxiety and depression: Stable;  Xanax as well as Prozac           # Pulmonary:COPD- wheezing- recommend albuterol; prescribed / Pulmonary embolism:  Continue Eliquis.  STABLE.   # Chemotherapy induced anemia: on retacrit hemoglobin around 7.5- STABLE- proceed with Retacrit.            # DISPOSITION: # ALimta today; Beryle Flock; Retacrit today; HOLD zometa today  # cancel- udenyca ppt for tomorrow.  # follow up in 3 weeks- MD [facility] with diff, CMP, Mg], Alimta and pembrolizumab;Zometa- retacrit;  D-2 Ellen Henri- Dr.B         All questions were answered. The patient knows to call the clinic with any problems, questions or concerns.       Cammie Sickle, MD 04/09/2021 12:09 PM

## 2021-04-09 NOTE — Assessment & Plan Note (Addendum)
#   Metastatic lung cancer-on keytruda+Alimta maintenance; PET scan June 2022-progressive right lower lobe hypermetabolic mass; no evidence of any distant metastatic disease or mediastinal adenopathy or osseous metastases.  Currently undergoing SBRT [July 26th- last]. Will order repeat scan at next visit.   # Proceed with Keytruda+ alimta. Labs today reviewed;  acceptable for treatment today.   patient will need Reatcrit; cancel Udenyca- tomorrow WBC 23.  Hb 7.5.   # Hypoklaemia- 3.2; discussed dietary; declines Kdur for now.    #Anemia-secondary chemotherapy hemoglobin 7.5-proceed with Retacrit every 3 weeks.  We will add iron studies.  #CKD stage III-GFR-GFR52- STABLE.   #Back pain/bone mets-continue current oxycodone; Zometa every 3 months; Zometa 3.5 next visit  Calcium- 8.5.   #Intermittent difficulty swallowing- ? RT- imporved; monitor for now.    #Anxiety and depression: Stable;  Xanax as well as Prozac           #Pulmonary:COPD- wheezing- recommend albuterol; prescribed /Pulmonary embolism: Continue Eliquis.  STABLE.   # Chemotherapy induced anemia: on retacrit hemoglobin around 7.5- STABLE- proceed with Retacrit.            # DISPOSITION: # ALimta today; Beryle Flock; Retacrit today; HOLD zometa today  # cancel- udenyca ppt for tomorrow.  # follow up in 3 weeks- MD [facility] with diff, CMP, Mg], Alimta and pembrolizumab;Zometa- retacrit;  D-2 Ellen Henri- Dr.B

## 2021-04-09 NOTE — Progress Notes (Signed)
Per MD add compazine and zogran 4mg  as premeds.

## 2021-04-10 ENCOUNTER — Ambulatory Visit: Payer: Medicare HMO

## 2021-04-15 ENCOUNTER — Other Ambulatory Visit: Payer: Self-pay | Admitting: *Deleted

## 2021-04-15 DIAGNOSIS — G893 Neoplasm related pain (acute) (chronic): Secondary | ICD-10-CM

## 2021-04-15 MED ORDER — OXYCODONE HCL 20 MG PO TABS: 20.0000 mg | ORAL_TABLET | ORAL | 0 refills | Status: DC | PRN

## 2021-04-23 ENCOUNTER — Telehealth: Payer: Self-pay | Admitting: *Deleted

## 2021-04-23 NOTE — Telephone Encounter (Signed)
Call from [place] reporting that patient is not doing well, he has no energy and is asking her to test him for COVID.She is asking if that is what she should do or if he needs to be seen in our office. I told her that the appropriate this is to test him for COVID if he thinks he has been exposed and to call me back with results either weay. I explained if negative, we may need to evaluate him and if positive, he will need antiviral medicine. She agreed to test him and call me back

## 2021-04-23 NOTE — Telephone Encounter (Signed)
Patient will need to be evaluated in [facility]- he is on immunotherapy. Josh- do you want to schedule virtual?

## 2021-04-24 ENCOUNTER — Other Ambulatory Visit: Payer: Self-pay

## 2021-04-24 ENCOUNTER — Inpatient Hospital Stay: Payer: Medicare HMO | Admitting: Hospice and Palliative Medicine

## 2021-04-24 ENCOUNTER — Emergency Department: Payer: Medicare HMO

## 2021-04-24 ENCOUNTER — Inpatient Hospital Stay
Admission: EM | Admit: 2021-04-24 | Discharge: 2021-04-28 | DRG: 178 | Disposition: A | Discharge status: Home Health | Payer: Medicare HMO | Attending: Internal Medicine | Admitting: Internal Medicine

## 2021-04-24 DIAGNOSIS — D649 Anemia, unspecified: Secondary | ICD-10-CM | POA: Diagnosis present

## 2021-04-24 DIAGNOSIS — Z7901 Long term (current) use of anticoagulants: Secondary | ICD-10-CM

## 2021-04-24 DIAGNOSIS — I959 Hypotension, unspecified: Secondary | ICD-10-CM | POA: Diagnosis not present

## 2021-04-24 DIAGNOSIS — D6481 Anemia due to antineoplastic chemotherapy: Secondary | ICD-10-CM | POA: Diagnosis present

## 2021-04-24 DIAGNOSIS — R0902 Hypoxemia: Secondary | ICD-10-CM | POA: Diagnosis not present

## 2021-04-24 DIAGNOSIS — C3431 Malignant neoplasm of lower lobe, right bronchus or lung: Secondary | ICD-10-CM | POA: Diagnosis present

## 2021-04-24 DIAGNOSIS — Z955 Presence of coronary angioplasty implant and graft: Secondary | ICD-10-CM

## 2021-04-24 DIAGNOSIS — I214 Non-ST elevation (NSTEMI) myocardial infarction: Secondary | ICD-10-CM | POA: Diagnosis present

## 2021-04-24 DIAGNOSIS — F419 Anxiety disorder, unspecified: Secondary | ICD-10-CM | POA: Diagnosis present

## 2021-04-24 DIAGNOSIS — C3491 Malignant neoplasm of unspecified part of right bronchus or lung: Secondary | ICD-10-CM

## 2021-04-24 DIAGNOSIS — R0781 Pleurodynia: Secondary | ICD-10-CM | POA: Diagnosis present

## 2021-04-24 DIAGNOSIS — J189 Pneumonia, unspecified organism: Secondary | ICD-10-CM | POA: Diagnosis present

## 2021-04-24 DIAGNOSIS — C799 Secondary malignant neoplasm of unspecified site: Secondary | ICD-10-CM | POA: Diagnosis present

## 2021-04-24 DIAGNOSIS — G893 Neoplasm related pain (acute) (chronic): Secondary | ICD-10-CM | POA: Diagnosis present

## 2021-04-24 DIAGNOSIS — C3490 Malignant neoplasm of unspecified part of unspecified bronchus or lung: Secondary | ICD-10-CM | POA: Diagnosis not present

## 2021-04-24 DIAGNOSIS — R634 Abnormal weight loss: Secondary | ICD-10-CM | POA: Diagnosis present

## 2021-04-24 DIAGNOSIS — Z66 Do not resuscitate: Secondary | ICD-10-CM | POA: Diagnosis not present

## 2021-04-24 DIAGNOSIS — I252 Old myocardial infarction: Secondary | ICD-10-CM

## 2021-04-24 DIAGNOSIS — Z809 Family history of malignant neoplasm, unspecified: Secondary | ICD-10-CM

## 2021-04-24 DIAGNOSIS — R0689 Other abnormalities of breathing: Secondary | ICD-10-CM | POA: Diagnosis not present

## 2021-04-24 DIAGNOSIS — J690 Pneumonitis due to inhalation of food and vomit: Secondary | ICD-10-CM | POA: Diagnosis not present

## 2021-04-24 DIAGNOSIS — Z79899 Other long term (current) drug therapy: Secondary | ICD-10-CM

## 2021-04-24 DIAGNOSIS — F32A Depression, unspecified: Secondary | ICD-10-CM | POA: Diagnosis present

## 2021-04-24 DIAGNOSIS — E876 Hypokalemia: Secondary | ICD-10-CM | POA: Diagnosis present

## 2021-04-24 DIAGNOSIS — R112 Nausea with vomiting, unspecified: Secondary | ICD-10-CM | POA: Diagnosis present

## 2021-04-24 DIAGNOSIS — T451X5A Adverse effect of antineoplastic and immunosuppressive drugs, initial encounter: Secondary | ICD-10-CM | POA: Diagnosis present

## 2021-04-24 DIAGNOSIS — R531 Weakness: Secondary | ICD-10-CM | POA: Diagnosis not present

## 2021-04-24 DIAGNOSIS — R197 Diarrhea, unspecified: Secondary | ICD-10-CM | POA: Diagnosis present

## 2021-04-24 DIAGNOSIS — D701 Agranulocytosis secondary to cancer chemotherapy: Secondary | ICD-10-CM | POA: Diagnosis present

## 2021-04-24 DIAGNOSIS — Z20822 Contact with and (suspected) exposure to covid-19: Secondary | ICD-10-CM | POA: Diagnosis present

## 2021-04-24 DIAGNOSIS — Z743 Need for continuous supervision: Secondary | ICD-10-CM | POA: Diagnosis not present

## 2021-04-24 DIAGNOSIS — Z8249 Family history of ischemic heart disease and other diseases of the circulatory system: Secondary | ICD-10-CM

## 2021-04-24 DIAGNOSIS — Z87891 Personal history of nicotine dependence: Secondary | ICD-10-CM

## 2021-04-24 DIAGNOSIS — R0602 Shortness of breath: Secondary | ICD-10-CM | POA: Diagnosis not present

## 2021-04-24 DIAGNOSIS — I2510 Atherosclerotic heart disease of native coronary artery without angina pectoris: Secondary | ICD-10-CM | POA: Diagnosis present

## 2021-04-24 LAB — COMPREHENSIVE METABOLIC PANEL
ALT: 9 U/L (ref 0–44)
AST: 10 U/L — ABNORMAL LOW (ref 15–41)
Albumin: 2.6 g/dL — ABNORMAL LOW (ref 3.5–5.0)
Alkaline Phosphatase: 63 U/L (ref 38–126)
Anion gap: 9 (ref 5–15)
BUN: 23 mg/dL (ref 8–23)
CO2: 24 mmol/L (ref 22–32)
Calcium: 8.2 mg/dL — ABNORMAL LOW (ref 8.9–10.3)
Chloride: 107 mmol/L (ref 98–111)
Creatinine, Ser: 1.36 mg/dL — ABNORMAL HIGH (ref 0.61–1.24)
GFR, Estimated: 56 mL/min — ABNORMAL LOW (ref >60)
Glucose, Bld: 117 mg/dL — ABNORMAL HIGH (ref 70–99)
Potassium: 3.1 mmol/L — ABNORMAL LOW (ref 3.5–5.1)
Sodium: 140 mmol/L (ref 135–145)
Total Bilirubin: 1.1 mg/dL (ref 0.3–1.2)
Total Protein: 5.8 g/dL — ABNORMAL LOW (ref 6.5–8.1)

## 2021-04-24 LAB — CBC WITH DIFFERENTIAL/PLATELET
Abs Immature Granulocytes: 0.03 K/uL (ref 0.00–0.07)
Basophils Absolute: 0 K/uL (ref 0.0–0.1)
Basophils Relative: 0 %
Eosinophils Absolute: 0 K/uL (ref 0.0–0.5)
Eosinophils Relative: 1 %
HCT: 17 % — ABNORMAL LOW (ref 39.0–52.0)
Hemoglobin: 5.4 g/dL — ABNORMAL LOW (ref 13.0–17.0)
Immature Granulocytes: 1 %
Lymphocytes Relative: 3 %
Lymphs Abs: 0.2 K/uL — ABNORMAL LOW (ref 0.7–4.0)
MCH: 29 pg (ref 26.0–34.0)
MCHC: 31.8 g/dL (ref 30.0–36.0)
MCV: 91.4 fL (ref 80.0–100.0)
Monocytes Absolute: 0.7 K/uL (ref 0.1–1.0)
Monocytes Relative: 11 %
Neutro Abs: 5 K/uL (ref 1.7–7.7)
Neutrophils Relative %: 84 %
Platelets: 229 K/uL (ref 150–400)
RBC: 1.86 MIL/uL — ABNORMAL LOW (ref 4.22–5.81)
RDW: 23.9 % — ABNORMAL HIGH (ref 11.5–15.5)
Smear Review: NORMAL
WBC: 5.9 K/uL (ref 4.0–10.5)
nRBC: 0 % (ref 0.0–0.2)

## 2021-04-24 LAB — TROPONIN I (HIGH SENSITIVITY)
Troponin I (High Sensitivity): 219 ng/L (ref <18)
Troponin I (High Sensitivity): 229 ng/L (ref <18)
Troponin I (High Sensitivity): 179 ng/L (ref <18)

## 2021-04-24 LAB — PROTIME-INR
INR: 1.4 — ABNORMAL HIGH (ref 0.8–1.2)
Prothrombin Time: 17.2 seconds — ABNORMAL HIGH (ref 11.4–15.2)

## 2021-04-24 LAB — PREPARE RBC (CROSSMATCH)

## 2021-04-24 LAB — IRON AND TIBC
Iron: 47 ug/dL (ref 45–182)
Saturation Ratios: 20 % (ref 17.9–39.5)
TIBC: 239 ug/dL — ABNORMAL LOW (ref 250–450)
UIBC: 192 ug/dL

## 2021-04-24 LAB — T4, FREE: Free T4: 0.97 ng/dL (ref 0.61–1.12)

## 2021-04-24 LAB — ABO/RH: ABO/RH(D): O POS

## 2021-04-24 LAB — APTT: aPTT: 54 seconds — ABNORMAL HIGH (ref 24–36)

## 2021-04-24 LAB — TSH
TSH: 1.027 u[IU]/mL (ref 0.350–4.500)
TSH: 1.049 u[IU]/mL (ref 0.350–4.500)

## 2021-04-24 LAB — HIV ANTIBODY (ROUTINE TESTING W REFLEX): HIV Screen 4th Generation wRfx: NONREACTIVE

## 2021-04-24 LAB — RESP PANEL BY RT-PCR (FLU A&B, COVID) ARPGX2
Influenza A by PCR: NEGATIVE
Influenza B by PCR: NEGATIVE
SARS Coronavirus 2 by RT PCR: NEGATIVE

## 2021-04-24 LAB — BRAIN NATRIURETIC PEPTIDE: B Natriuretic Peptide: 552.9 pg/mL — ABNORMAL HIGH (ref 0.0–100.0)

## 2021-04-24 LAB — PROCALCITONIN: Procalcitonin: 0.17 ng/mL

## 2021-04-24 LAB — MAGNESIUM: Magnesium: 1.8 mg/dL (ref 1.7–2.4)

## 2021-04-24 LAB — HEMOGLOBIN AND HEMATOCRIT, BLOOD
HCT: 22.5 % — ABNORMAL LOW (ref 39.0–52.0)
Hemoglobin: 7.4 g/dL — ABNORMAL LOW (ref 13.0–17.0)

## 2021-04-24 LAB — FERRITIN: Ferritin: 1871 ng/mL — ABNORMAL HIGH (ref 24–336)

## 2021-04-24 MED ORDER — PANTOPRAZOLE SODIUM 40 MG IV SOLR
40.0000 mg | Freq: Once | INTRAVENOUS | Status: DC
  Filled 2021-04-24: qty 40

## 2021-04-24 MED ORDER — MIDODRINE HCL 5 MG PO TABS: 10.0000 mg | ORAL_TABLET | Freq: Three times a day (TID) | ORAL | Status: DC | PRN

## 2021-04-24 MED ORDER — SODIUM CHLORIDE 0.9 % IV BOLUS
500.0000 mL | Freq: Once | INTRAVENOUS | Status: AC
  Administered 2021-04-24: 500 mL via INTRAVENOUS

## 2021-04-24 MED ORDER — ONDANSETRON HCL 4 MG PO TABS: 4.0000 mg | ORAL_TABLET | Freq: Four times a day (QID) | ORAL | Status: DC | PRN

## 2021-04-24 MED ORDER — PANTOPRAZOLE SODIUM 40 MG IV SOLR: 40.0000 mg | Freq: Once | INTRAVENOUS | Status: DC

## 2021-04-24 MED ORDER — SODIUM CHLORIDE 0.9% IV SOLUTION
Freq: Once | INTRAVENOUS | Status: AC
  Filled 2021-04-24: qty 250

## 2021-04-24 MED ORDER — FLUOXETINE HCL 20 MG PO CAPS
40.0000 mg | ORAL_CAPSULE | Freq: Every day | ORAL | Status: DC
  Administered 2021-04-24 – 2021-04-28 (×5): 40 mg via ORAL
  Filled 2021-04-24 (×5): qty 2

## 2021-04-24 MED ORDER — POTASSIUM CHLORIDE CRYS ER 20 MEQ PO TBCR
40.0000 meq | EXTENDED_RELEASE_TABLET | Freq: Once | ORAL | Status: AC
  Administered 2021-04-24: 40 meq via ORAL
  Filled 2021-04-24: qty 2

## 2021-04-24 MED ORDER — ACETAMINOPHEN 325 MG PO TABS: 650.0000 mg | ORAL_TABLET | Freq: Four times a day (QID) | ORAL | Status: AC | PRN

## 2021-04-24 MED ORDER — SODIUM CHLORIDE 0.9 % IV SOLN
10.0000 mL/h | Freq: Once | INTRAVENOUS | Status: AC
  Administered 2021-04-24: 10 mL/h via INTRAVENOUS

## 2021-04-24 MED ORDER — ACETAMINOPHEN 650 MG RE SUPP: 650.0000 mg | Freq: Four times a day (QID) | RECTAL | Status: AC | PRN

## 2021-04-24 MED ORDER — OXYCODONE HCL 5 MG PO TABS
20.0000 mg | ORAL_TABLET | Freq: Four times a day (QID) | ORAL | Status: AC | PRN
  Administered 2021-04-25: 30 mg via ORAL
  Administered 2021-04-26: 12:00:00 20 mg via ORAL
  Filled 2021-04-24: qty 4
  Filled 2021-04-24 (×2): qty 6

## 2021-04-24 MED ORDER — OLANZAPINE 10 MG PO TABS
10.0000 mg | ORAL_TABLET | Freq: Every day | ORAL | Status: DC
  Administered 2021-04-25 – 2021-04-27 (×4): 10 mg via ORAL
  Filled 2021-04-24 (×5): qty 1

## 2021-04-24 MED ORDER — METHYLPREDNISOLONE SODIUM SUCC 40 MG IJ SOLR
40.0000 mg | Freq: Two times a day (BID) | INTRAMUSCULAR | Status: AC
  Administered 2021-04-24 – 2021-04-25 (×2): 40 mg via INTRAVENOUS
  Filled 2021-04-24 (×2): qty 1

## 2021-04-24 MED ORDER — ALBUTEROL SULFATE (2.5 MG/3ML) 0.083% IN NEBU: 2.5000 mg | INHALATION_SOLUTION | Freq: Four times a day (QID) | RESPIRATORY_TRACT | Status: DC | PRN

## 2021-04-24 MED ORDER — ALPRAZOLAM 1 MG PO TABS
1.0000 mg | ORAL_TABLET | Freq: Three times a day (TID) | ORAL | Status: DC | PRN
  Administered 2021-04-25 – 2021-04-28 (×6): 1 mg via ORAL
  Filled 2021-04-24 (×5): qty 1
  Filled 2021-04-24: qty 2

## 2021-04-24 MED ORDER — ALBUTEROL SULFATE HFA 108 (90 BASE) MCG/ACT IN AERS: 2.0000 | INHALATION_SPRAY | Freq: Four times a day (QID) | RESPIRATORY_TRACT | Status: DC | PRN

## 2021-04-24 MED ORDER — ONDANSETRON HCL 4 MG/2ML IJ SOLN
4.0000 mg | Freq: Four times a day (QID) | INTRAMUSCULAR | Status: DC | PRN
Start: 2021-04-24 — End: 2021-04-28

## 2021-04-24 MED ORDER — PANTOPRAZOLE 80MG IVPB - SIMPLE MED
80.0000 mg | Freq: Two times a day (BID) | INTRAVENOUS | Status: AC
  Administered 2021-04-24 – 2021-04-25 (×4): 80 mg via INTRAVENOUS
  Filled 2021-04-24: qty 100
  Filled 2021-04-24 (×2): qty 80
  Filled 2021-04-24 (×3): qty 100
  Filled 2021-04-24: qty 80

## 2021-04-24 MED ORDER — MORPHINE SULFATE (PF) 2 MG/ML IV SOLN
2.0000 mg | INTRAVENOUS | Status: DC | PRN
  Administered 2021-04-24 – 2021-04-26 (×2): 2 mg via INTRAVENOUS
  Filled 2021-04-24 (×2): qty 1

## 2021-04-24 NOTE — ED Triage Notes (Signed)
Pt here via [facility] from home with SOB. Pt is a stage 4 lung CA pt who gets treatment here. Pt's last treatment was 2 weeks ago. Pt also c/o diarrhea.

## 2021-04-24 NOTE — ED Provider Notes (Signed)
[facility] Emergency Department Provider Note  ____________________________________________   Event Date/Time   First MD Initiated Contact with Patient 04/24/21 1011     (approximate)  I have reviewed the triage vital signs and the nursing notes.   HISTORY  Chief Complaint Shortness of Breath    HPI Jimmy West is a 70 y.o. male with metastatic lung cancer who is currently on chemotherapy who gets chemotherapy every 3 weeks, coronary disease who comes in with concerns for worsening weakness and shortness of breath.  Patient reports having some intermittent diarrhea for the past few weeks.  He states that he started to feel more weak today just felt like he could not get from his room to the bathroom.  He reports some shortness of breath associated with it as well, constant, nothing makes it better, worse with exertion.  Patient is currently on Eliquis.  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  He denies any abdominal pain, leg swelling, fevers or any other concern          Past Medical History:  Diagnosis Date   Bulging lumbar disc    Cancer ([employer])    stage 4 lung cancer   Heart attack [facility])     Patient Active Problem List   Diagnosis Date Noted   Hypercalcemia 11/26/2020   Nonintractable headache 07/01/2020   Chronic anticoagulation 05/02/2020   Pleuritic pain 03/26/2020   Chemotherapy-induced neutropenia ([place]) 01/06/2020   Depression 12/21/2019   Palliative care encounter 08/26/2019   Anemia due to antineoplastic chemotherapy 06/27/2019   C. difficile diarrhea 06/26/2019   Pulmonary embolus ([place]) 06/20/2019   Anxiety in cancer patient 03/29/2019   Renal insufficiency 03/29/2019   Other fatigue 03/04/2019   Vomiting 03/04/2019   Chemotherapy induced nausea and vomiting 02/12/2019   Abdominal pain 02/11/2019   Mild protein-calorie malnutrition ([place]) 10/10/2018   Weight loss 09/20/2018   Elevated bilirubin  07/14/2018   Encounter for antineoplastic chemotherapy 05/24/2018   Encounter for antineoplastic immunotherapy 05/24/2018   Goals of care, counseling/discussion 05/17/2018   Cancer-related pain 05/01/2018   Bone metastasis ([place]) 04/30/2018   Primary cancer of right lower lobe of lung ([place]) 04/26/2018   Chronic midline low back pain without sciatica 04/26/2018    Past Surgical History:  Procedure Laterality Date   CARDIAC CATHETERIZATION     two stents   KNEE SURGERY Left    PORTA CATH INSERTION N/A 05/21/2018   Procedure: PORTA CATH INSERTION;  Surgeon: Algernon Huxley, MD;  Location: [facility];  Service: Cardiovascular;  Laterality: N/A;    Prior to Admission medications   Medication Sig Start Date End Date Taking? Authorizing Provider  albuterol (VENTOLIN HFA) 108 (90 Base) MCG/ACT inhaler Inhale 2 puffs into the lungs every 6 (six) hours as needed for wheezing or shortness of breath. 04/09/21   Cammie Sickle, MD  ALPRAZolam Duanne Moron) 1 MG tablet Take 1 tablet (1 mg total) by mouth 3 (three) times daily as needed for anxiety. 03/26/21   Borders, Kirt Boys, NP  apixaban (ELIQUIS) 5 MG TABS tablet Take 1 tablet (5 mg total) by mouth 2 (two) times daily. 10/23/20   Lequita Asal, MD  dexamethasone (DECADRON) 4 MG tablet TAKE 1 TABLET BY MOUTH TWICE A DAY BEFORE ALIMTA CHEMO, THEN TAKE 2 TABS ONCE DAILY X 3 DAYS STARTING DAY AFTER CHEMO 02/27/21   Cammie Sickle, MD  FLUoxetine (PROZAC) 40 MG capsule Take 1 capsule (40 mg  total) by mouth daily. 02/21/21   Borders, Kirt Boys, NP  folic acid (FOLVITE) 1 MG tablet Take 1 tablet (1 mg total) by mouth daily. Continue until 21 days after Alimta completed. 12/03/20   Borders, Kirt Boys, NP  OLANZapine (ZYPREXA) 10 MG tablet Take 1 tablet (10 mg total) by mouth at bedtime. 04/03/21   Borders, Kirt Boys, NP  Oxycodone HCl 20 MG TABS Take 1-1.5 tablets (20-30 mg total) by mouth every 4 (four) hours as needed. 04/15/21   Jacquelin Hawking,  NP  polyethylene glycol (MIRALAX / GLYCOLAX) packet Take 17 g by mouth daily.    [provider]  prochlorperazine (COMPAZINE) 10 MG tablet Take 1 tablet (10 mg total) by mouth every 6 (six) hours as needed for nausea or vomiting. 10/10/20   Borders, Kirt Boys, NP  senna (SENOKOT) 8.6 MG tablet Take 1 tablet by mouth as needed.     [provider]    Allergies Patient has no known allergies.  Family History  Problem Relation Age of Onset   Heart failure Father    Cancer Maternal Aunt    Heart failure Maternal Uncle    Dementia Paternal Grandmother    Cancer Maternal Aunt     Social History Social History   Tobacco Use   Smoking status: Former    Packs/day: 1.50    Years: 15.00    Pack years: 22.50    Types: Cigarettes    Quit date: 11/03/2003    Years since quitting: 17.4   Smokeless tobacco: Never  Vaping Use   Vaping Use: Never used  Substance Use Topics   Alcohol use: Not Currently   Drug use: No      Review of Systems Constitutional: No fever/chills Eyes: No visual changes. ENT: No sore throat. Cardiovascular: No chest pain Respiratory: Positive for SOB Gastrointestinal: No abdominal pain.  No nausea, no vomiting.  No diarrhea.  No constipation. Genitourinary: Negative for dysuria. Musculoskeletal: Negative for back pain. Skin: Negative for rash. Neurological: Negative for headaches, focal weakness or numbness. All other ROS negative ____________________________________________   PHYSICAL EXAM:  VITAL SIGNS: Blood pressure 90/60, pulse 92, temperature 98.4 F (36.9 C), temperature source Oral, resp. rate 19, height 5\' 6"  (1.676 m), weight 59.4 kg, SpO2 100 %.  Constitutional: Alert and oriented. Well appearing and in no acute distress. Eyes: Conjunctivae are normal. EOMI. Head: Atraumatic. Nose: No congestion/rhinnorhea. Mouth/Throat: Mucous membranes are moist.   Neck: No stridor. Trachea Midline. FROM Cardiovascular: Normal rate,  regular rhythm. Grossly normal heart sounds.  Good peripheral circulation. Respiratory:  Gastrointestinal: Soft and nontender. No distention. No abdominal bruits.  Musculoskeletal: No lower extremity tenderness nor edema.  No joint effusions. Neurologic:  Normal speech and language. No gross focal neurologic deficits are appreciated.  Skin:  Skin is warm, dry and intact. No rash noted. Psychiatric: Mood and affect are normal. Speech and behavior are normal. GU: Deferred   ____________________________________________   LABS (all labs ordered are listed, but only abnormal results are displayed)  Labs Reviewed  RESP PANEL BY RT-PCR (FLU A&B, COVID) ARPGX2  CBC WITH DIFFERENTIAL/PLATELET  COMPREHENSIVE METABOLIC PANEL  BRAIN NATRIURETIC PEPTIDE  TROPONIN I (HIGH SENSITIVITY)   ____________________________________________   ED ECG REPORT I, Vanessa De[place], the attending physician, personally viewed and interpreted this ECG.  Normal sinus rate of 97, no ST elevation, no T wave inversions, normal intervals ____________________________________________  RADIOLOGY Robert Bellow, personally viewed and evaluated these images (plain radiographs) as  part of my medical decision making, as well as reviewing the written report by the radiologist.  ED MD interpretation: Right lower lung consolidation  Official radiology report(s): DG Chest 2 View  Result Date: 04/24/2021 CLINICAL DATA:  Shortness of breath. History of stage IV lung cancer. EXAM: CHEST - 2 VIEW COMPARISON:  Chest radiographs 09/18/2020 and CT 11/01/2020 FINDINGS: A right jugular Port-A-Cath remains in place terminating over the right atrium. The cardiac silhouette is within normal limits in size. There is volume loss in the hemithorax with increasing confluent and patchy opacity in the right perihilar region and right lung base which partially obscures the known superior segment right lower lobe mass. There may be a trace right  pleural effusion. The interstitial markings are mildly prominent throughout the left lung without focal consolidation. No pneumothorax is identified. No acute osseous abnormality is seen. IMPRESSION: Increasing right lower lung consolidation and volume loss. Electronically Signed   By: Logan Bores M.D.   On: 04/24/2021 11:36    ____________________________________________   PROCEDURES  Procedure(s) performed (including Critical Care):  .1-3 Lead EKG Interpretation Performed by: Vanessa [place], MD Authorized by: Vanessa [place], MD     Interpretation: normal     ECG rate:  90s   ECG rate assessment: normal     Rhythm: sinus rhythm     Ectopy: none     Conduction: normal   .Critical Care Performed by: Vanessa [place], MD Authorized by: Vanessa [place], MD   Critical care provider statement:    Critical care time (minutes):  45   Critical care was necessary to treat or prevent imminent or life-threatening deterioration of the following conditions: Anemia requiring blood transfusion.   Critical care was time spent personally by me on the following activities:  Discussions with consultants, evaluation of patient's response to treatment, examination of patient, ordering and performing treatments and interventions, ordering and review of laboratory studies, ordering and review of radiographic studies, pulse oximetry, re-evaluation of patient's condition, obtaining history from patient or surrogate and review of old charts   ____________________________________________   INITIAL IMPRESSION / [place] / ED COURSE   Jimmy West was evaluated in Emergency Department on 04/24/2021 for the symptoms described in the history of present illness. He was evaluated in the context of the global COVID-19 pandemic, which necessitated consideration that the patient might be at risk for infection with the SARS-CoV-2 virus that causes COVID-19. Institutional protocols and algorithms that  pertain to the evaluation of patients at risk for COVID-19 are in a state of rapid change based on information released by regulatory bodies including the CDC and federal and state organizations. These policies and algorithms were followed during the patient's care in the ED.     Pt presents with SOB. Differential includes: PNA-will get xray to evaluation Anemia-CBC to evaluate ACS- will get trops Arrhythmia-Will get EKG and keep on monitor.  COVID- will get testing per algorithm. PE-lower suspicion given no risk factors and other cause more likely given patient's already on Eliquis   Patient's hemoglobin is low.  Patient has brown stool but Hemoccult positive so please send Protonix.  Patient's troponin is elevated but I suspect it is more likely demand secondary to the anemia I do not feel that heparin is recommended at this time given patient has no chest pain.  COVID test is negative.  Patient was placed on 2 L of oxygen due to sats of 90% this could be from the  anemia from his lung cancer.  We will discussed with the hospital team for admission   ____________________________________________   FINAL CLINICAL IMPRESSION(S) / ED DIAGNOSES   Final diagnoses:  Malignant neoplasm of right lung, unspecified part of lung (HCC)  Symptomatic anemia     MEDICATIONS GIVEN DURING THIS VISIT:  Medications  0.9 %  sodium chloride infusion (has no administration in time range)  pantoprazole (PROTONIX) injection 40 mg (has no administration in time range)  ALPRAZolam (XANAX) tablet 1 mg (has no administration in time range)  albuterol (VENTOLIN HFA) 108 (90 Base) MCG/ACT inhaler 2 puff (has no administration in time range)  acetaminophen (TYLENOL) tablet 650 mg (has no administration in time range)    Or  acetaminophen (TYLENOL) suppository 650 mg (has no administration in time range)  ondansetron (ZOFRAN) tablet 4 mg (has no administration in time range)    Or  ondansetron (ZOFRAN)  injection 4 mg (has no administration in time range)  morphine 2 MG/ML injection 2 mg (has no administration in time range)  sodium chloride 0.9 % bolus 500 mL (500 mLs Intravenous New Bag/Given 04/24/21 1107)     ED Discharge Orders     None        Note:  This document was prepared using Dragon voice recognition software and may include unintentional dictation errors.   Vanessa [place], MD 04/24/21 1240

## 2021-04-24 NOTE — Progress Notes (Addendum)
[facility]  Progress Note - No charge  Reevaluated patient at bedside.   He is awake, alert, oriented, and does not appear to be in acute distress.  HR 93, RR 18, BP 85/62, MAP 70  Blood product is running at bedside. Patient is drinking non-caffeinate tea at bedside and states the tea tastes good to him.  He tells me that in the past few weeks, he noticed that when he drinks water and/or thin fluids, he does 'cough a bit'.   Cough with thin liquids - Changed diet to nectar thick, discussed with RN at bedside - SLP consulted  - Continue aspiration precautions  Dr. Tobie Poet

## 2021-04-24 NOTE — H&P (Addendum)
History and Physical   Jimmy West WYO:[id number] DOB: March 06, 1951 DOA: 04/24/2021  PCP: Pcp, No  Outpatient Specialists: Dr. Rogue Bussing Patient coming from: Home via EMS  I have personally briefly reviewed patient's old medical records in [facility].  Chief Concern: Shortness of breath and weakness  HPI: Jimmy West is a 70 y.o. 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  At bedside, [name] is alert and oriented to self, age, current calendar year, and location.   He presents to the ED for shortness of breath that has been on/off for about two weeks.  Reports the shortness of breath is worse with exertion.  He endorses lack of energy.  Last evening, he states he felt that he had no strength to even get up to go the bathroom, this prompted him to call EMS for ED presentation. He reports wheezing in his throat.   He denies cough, fever, vomiting, loss of consciousness, syncope. He endorses nausea infrequently and states this has been his baseline since he started chemotherapy. He denies known sick contacts. He endorses loose/watery bowel for the last two weeks.   He endorses getting meals on wheels, however he has been feeling so weak that he has had poor p.o. intake for the last 2 days.  Patient he endorses increased loose stools and watery stools in the last 3 days.  He endorses significant weight loss in the last 6 months though he does not know the exact weight   Social history: He lives by himself. He is a former tobacco user, quitting in 2005. At his peak, he smoked about 2 ppds. He is a former etoh user and last drink was too long to remember.  He formerly smoked THC. He formerly worked as a Probation officer.   Vaccination history: He is vaccinated for covid 19, two doses and one booster. He does not know the brand.   ROS: Constitutional: + weight change, no fever ENT/Mouth: no sore throat, no rhinorrhea Eyes: no eye pain, no vision changes Cardiovascular: no chest pain, + dyspnea,  no edema, no palpitations Respiratory: no cough, no sputum, no wheezing Gastrointestinal: no nausea, no vomiting, no diarrhea, no constipation Genitourinary: no urinary incontinence, no dysuria, no hematuria Musculoskeletal: no arthralgias, no myalgias Skin: no skin lesions, no pruritus, Neuro: + weakness, no loss of consciousness, no syncope Psych: no anxiety, no depression, + decrease appetite Heme/Lymph: no bruising, no bleeding  ED Course: Discussed with emergency medicine provider, patient requiring hospitalization for acute on chronic anemia.  Vitals in the emergency department was remarkable for temperature 98.4, respiration rate of 22, heart rate of 93, initial blood pressure 90/60 and improved to 92/60, SPO2 of 100% on 1 L nasal cannula.  Labs in the emergency department was remarkable for serum sodium 140, potassium 3.1, chloride 107, bicarb 24, BUN 23, serum creatinine of 1.36, nonfasting blood glucose 117, WBC 5.6, hemoglobin 5.4, platelets 229, BNP 552.9, high sensitive troponin first value was 219.  eGFR 56.  Magnesium level was 1.8.  COVID/influenza A/influenza B PCR were negative.  In the emergency department, patient was given sodium chloride 500 mL bolus, initiated on Protonix 40 mg IV once.  EDP performed a fecal occult test which  was positive.  Assessment/Plan  Principal Problem:   Acute anemia Active Problems:   Primary cancer of right lower lobe of lung (HCC)   Cancer-related pain   Chemotherapy induced nausea and vomiting   Anxiety in cancer patient   Depression   Chemotherapy-induced neutropenia (HCC)   Pleuritic pain    Chronic anticoagulation   Acute hypokalemia   NSTEMI (non-ST elevated myocardial infarction) ([place])   Diarrhea   Mr. Jimmy West is a 70 year old male with medical history of stage IV right lower lobe lung cancer on chemotherapy who has been admitted to medicine service for chief concerns of shortness of breath presumed secondary to severe acute anemia.    # Shortness of breath-multifactorial including acute on chronic anemia versus right lower lobe pneumonia - Procalcitonin is pending - Stat procalcitonin, TSH, B12 ordered - 2 units of packed red blood cells have been ordered by EDP - Type and cross - Timed H/H ordered at 1800 on day of admission - Admit to [place], telemetry  # Mild hypotension-secondary to anemia - Midodrine 10 mg 3 times daily as needed for MAP less than 65  # Severe acute on chronic anemia-etiology work-up in progress; presumed secondary to multifactorial including antineoplastic therapy, poor appetite - Low clinical suspicion for active GI bleed at this time as patient denies vomiting and/or diarrhea with melena and or coffee-ground substances - Patient further denies bright red blood per rectum or vomiting blood or any vomiting - Nuclear medicine bleeding scan has been ordered - Continue 2 units of PRBC - Repeat hemoglobin as scheduled above - If his hemoglobin does not improve, would recommend a.m. team to consider hematology consultation - Goal hemoglobin is > 8 - I recommend a.m. team to consult GI if nuclear medicine bleeding scan is positive  # Chronic pain secondary to metastatic carcinoma - Patient takes oxycodone 20 to 30 mg p.o. daily 4 hours as needed for moderate pain, I change it to every 6 hours as needed for moderate pain in setting of low normotensive and mild hypotension - Discussed with cross coverage provider  # Diffuse wheezing on auscultation-Solu-Medrol 40 mg IV twice daily has been ordered  # NSTEMI- Low clinical suspicion for ACS as patient  is not having chest pain and there is no ST-T wave changes on EKG - We will continue to monitor high sensitive troponin - I suspect this is secondary to acute blood loss anemia  # Diarrhea-C. difficile, GI panel ordered - Patient has increased risk of infectious diarrhea as patient is immunocompromised in setting of stage IV lung cancer on chemotherapy  # Possible right lower lobe pneumonia-check procalcitonin  # Hypokalemia with potassium chloride 40 mEq one-time dose  # Right lower lobe stage IV adenocarcinoma - outpatient follow-up with hematologist/oncologist  # Depression/anxiety-resumed home alprazolam 1 mg 3 times daily as needed for anxiety, fluoxetine 40 mg daily  # Psychiatric imbalance-resumed olanzapine 10 mg nightly  # Code status: Extensive discussion with patient at bedside.  Patient expresses wishes to be allowed natural death given his stage IV lung cancer diagnosis.  He states that he has been referred to [facility] and palliative medicine however has not have the opportunity to speak with palliative medicine at this time. - Palliative medicine consultation was offered for patient, he states that he declines at this time because he just feels so weak to even discuss and go through the questionnaire - He states that he may be open to palliative medicine consultation tomorrow  # DVT  prophylaxis-pharmacologic DVT prophylaxis has been held by myself due to acute anemia  Chart reviewed.   DVT prophylaxis: TED hose Code Status: DNR/DNI  Diet: Regular diet Family Communication: No Disposition Plan: Pending clinical course Consults called: None at this time Admission status: MedSurg, observation, telemetry ordered  Past Medical History:  Diagnosis Date   Bulging lumbar disc    Cancer ([place])    stage 4 lung cancer   Heart attack [facility])    Past Surgical History:  Procedure Laterality Date   CARDIAC CATHETERIZATION     two stents   KNEE SURGERY Left    PORTA CATH INSERTION  N/A 05/21/2018   Procedure: PORTA CATH INSERTION;  Surgeon: Algernon Huxley, MD;  Location: [place] [facility];  Service: Cardiovascular;  Laterality: N/A;   Social History:  reports that he quit smoking about 17 years ago. His smoking use included cigarettes. He has a 22.50 pack-year smoking history. He has never used smokeless tobacco. He reports that he does not currently use alcohol. He reports that he does not use drugs.  No Known Allergies Family History  Problem Relation Age of Onset   Heart failure Father    Cancer Maternal Aunt    Heart failure Maternal Uncle    Dementia Paternal Grandmother    Cancer Maternal Aunt    Family history: Family history reviewed and not pertinent  Prior to Admission medications   Medication Sig Start Date End Date Taking? Authorizing Provider  albuterol (VENTOLIN HFA) 108 (90 Base) MCG/ACT inhaler Inhale 2 puffs into the lungs every 6 (six) hours as needed for wheezing or shortness of breath. 04/09/21   Cammie Sickle, MD  ALPRAZolam Duanne Moron) 1 MG tablet Take 1 tablet (1 mg total) by mouth 3 (three) times daily as needed for anxiety. 03/26/21   Borders, Kirt Boys, NP  apixaban (ELIQUIS) 5 MG TABS tablet Take 1 tablet (5 mg total) by mouth 2 (two) times daily. 10/23/20   Lequita Asal, MD  dexamethasone (DECADRON) 4 MG tablet TAKE 1 TABLET BY MOUTH TWICE A DAY BEFORE ALIMTA CHEMO, THEN TAKE 2 TABS ONCE DAILY X 3 DAYS STARTING DAY AFTER CHEMO 02/27/21   Cammie Sickle, MD  FLUoxetine (PROZAC) 40 MG capsule Take 1 capsule (40 mg total) by mouth daily. 02/21/21   Borders, Kirt Boys, NP  folic acid (FOLVITE) 1 MG tablet Take 1 tablet (1 mg total) by mouth daily. Continue until 21 days after Alimta completed. 12/03/20   Borders, Kirt Boys, NP  OLANZapine (ZYPREXA) 10 MG tablet Take 1 tablet (10 mg total) by mouth at bedtime. 04/03/21   Borders, Kirt Boys, NP  Oxycodone HCl 20 MG TABS Take 1-1.5 tablets (20-30 mg total) by mouth every 4 (four) hours as  needed. 04/15/21   Jacquelin Hawking, NP  polyethylene glycol (MIRALAX / GLYCOLAX) packet Take 17 g by mouth daily.    [provider]  prochlorperazine (COMPAZINE) 10 MG tablet Take 1 tablet (10 mg total) by mouth every 6 (six) hours as needed for nausea or vomiting. 10/10/20   Borders, Kirt Boys, NP  senna (SENOKOT) 8.6 MG tablet Take 1 tablet by mouth as needed.     [provider]   Physical Exam: Vitals:   04/24/21 1045 04/24/21 1100 04/24/21 1115 04/24/21 1603  BP:  90/60  (!) 82/47  Pulse: 93 93 92 96  Resp: (!) 24 (!) 22 19 (!) 29  Temp:    97.8 F (36.6 C)  TempSrc:  Axillary  SpO2: 100% 100% 100% 100%  Weight:      Height:       Constitutional: appears cachectic, frail, NAD, calm, comfortable Eyes: PERRL, lids and conjunctivae normal ENMT: Mucous membranes are moist. Posterior pharynx clear of any exudate or lesions. Age-appropriate dentition. Mild hearing loss  Neck: normal, supple, no masses, no thyromegaly Respiratory: + wheezing diffused, no crackles. Normal respiratory effort. No accessory muscle use.  Cardiovascular: Regular rate and rhythm, no murmurs / rubs / gallops. No extremity edema. 2+ pedal pulses. No carotid bruits.  Abdomen: no tenderness, no masses palpated, no hepatosplenomegaly. Bowel sounds positive.  Musculoskeletal: no clubbing / cyanosis. No joint deformity upper and lower extremities. Good ROM, no contractures, no atrophy. Normal muscle tone.  Skin: no rashes, lesions, ulcers. No induration Neurologic: Sensation intact. Strength 5/5 in all 4.  Psychiatric: Normal judgment and insight. Alert and oriented x 3. Normal mood.   EKG: independently reviewed, showing sinus rhythm with rate of 97, QTc 450  Chest x-ray on Admission: I personally reviewed and I agree with radiologist reading as below.  DG Chest 2 View  Result Date: 04/24/2021 CLINICAL DATA:  Shortness of breath. History of stage IV lung cancer. EXAM: CHEST - 2 VIEW  COMPARISON:  Chest radiographs 09/18/2020 and CT 11/01/2020 FINDINGS: A right jugular Port-A-Cath remains in place terminating over the right atrium. The cardiac silhouette is within normal limits in size. There is volume loss in the hemithorax with increasing confluent and patchy opacity in the right perihilar region and right lung base which partially obscures the known superior segment right lower lobe mass. There may be a trace right pleural effusion. The interstitial markings are mildly prominent throughout the left lung without focal consolidation. No pneumothorax is identified. No acute osseous abnormality is seen. IMPRESSION: Increasing right lower lung consolidation and volume loss. Electronically Signed   By: Logan Bores M.D.   On: 04/24/2021 11:36    Labs on Admission: I have personally reviewed following labs  CBC: Recent Labs  Lab 04/24/21 1042  WBC 5.9  NEUTROABS 5.0  HGB 5.4*  HCT 17.0*  MCV 91.4  PLT 400   Basic Metabolic Panel: Recent Labs  Lab 04/24/21 1042  NA 140  K 3.1*  CL 107  CO2 24  GLUCOSE 117*  BUN 23  CREATININE 1.36*  CALCIUM 8.2*  MG 1.8   GFR: Estimated Creatinine Clearance: 42.5 mL/min (A) (by C-G formula based on SCr of 1.36 mg/dL (H)).  Liver Function Tests: Recent Labs  Lab 04/24/21 1042  AST 10*  ALT 9  ALKPHOS 63  BILITOT 1.1  PROT 5.8*  ALBUMIN 2.6*   Thyroid Function Tests: Recent Labs    04/24/21 1042 04/24/21 1406  TSH 1.027 1.049  FREET4 0.97  --    Urine analysis:    Component Value Date/Time   COLORURINE AMBER (A) 10/20/2019 1308   APPEARANCEUR HAZY (A) 10/20/2019 1308   LABSPEC 1.018 10/20/2019 1308   PHURINE 5.0 10/20/2019 1308   GLUCOSEU NEGATIVE 10/20/2019 1308   HGBUR SMALL (A) 10/20/2019 1308   BILIRUBINUR NEGATIVE 10/20/2019 1308   KETONESUR NEGATIVE 10/20/2019 1308   PROTEINUR 100 (A) 10/20/2019 1308   NITRITE NEGATIVE 10/20/2019 1308   LEUKOCYTESUR NEGATIVE 10/20/2019 1308   Dr. Tobie Poet [facility]  If 7PM-7AM, please contact overnight-coverage provider If 7AM-7PM, please contact day coverage provider [email or web address]  04/24/2021, 4:10 PM

## 2021-04-25 ENCOUNTER — Other Ambulatory Visit: Payer: Self-pay | Admitting: Radiology

## 2021-04-25 ENCOUNTER — Observation Stay: Payer: Medicare HMO

## 2021-04-25 ENCOUNTER — Encounter: Payer: Self-pay | Admitting: Internal Medicine

## 2021-04-25 DIAGNOSIS — R634 Abnormal weight loss: Secondary | ICD-10-CM | POA: Diagnosis not present

## 2021-04-25 DIAGNOSIS — C3431 Malignant neoplasm of lower lobe, right bronchus or lung: Secondary | ICD-10-CM | POA: Diagnosis not present

## 2021-04-25 DIAGNOSIS — I959 Hypotension, unspecified: Secondary | ICD-10-CM | POA: Diagnosis not present

## 2021-04-25 DIAGNOSIS — I252 Old myocardial infarction: Secondary | ICD-10-CM | POA: Diagnosis not present

## 2021-04-25 DIAGNOSIS — F419 Anxiety disorder, unspecified: Secondary | ICD-10-CM | POA: Diagnosis not present

## 2021-04-25 DIAGNOSIS — Z809 Family history of malignant neoplasm, unspecified: Secondary | ICD-10-CM | POA: Diagnosis not present

## 2021-04-25 DIAGNOSIS — J189 Pneumonia, unspecified organism: Secondary | ICD-10-CM | POA: Diagnosis present

## 2021-04-25 DIAGNOSIS — G893 Neoplasm related pain (acute) (chronic): Secondary | ICD-10-CM | POA: Diagnosis not present

## 2021-04-25 DIAGNOSIS — F32A Depression, unspecified: Secondary | ICD-10-CM | POA: Diagnosis not present

## 2021-04-25 DIAGNOSIS — Z79899 Other long term (current) drug therapy: Secondary | ICD-10-CM | POA: Diagnosis not present

## 2021-04-25 DIAGNOSIS — R0602 Shortness of breath: Secondary | ICD-10-CM | POA: Diagnosis not present

## 2021-04-25 DIAGNOSIS — Z87891 Personal history of nicotine dependence: Secondary | ICD-10-CM | POA: Diagnosis not present

## 2021-04-25 DIAGNOSIS — I2510 Atherosclerotic heart disease of native coronary artery without angina pectoris: Secondary | ICD-10-CM | POA: Diagnosis not present

## 2021-04-25 DIAGNOSIS — J690 Pneumonitis due to inhalation of food and vomit: Secondary | ICD-10-CM | POA: Diagnosis not present

## 2021-04-25 DIAGNOSIS — D6481 Anemia due to antineoplastic chemotherapy: Secondary | ICD-10-CM | POA: Diagnosis not present

## 2021-04-25 DIAGNOSIS — E876 Hypokalemia: Secondary | ICD-10-CM | POA: Diagnosis not present

## 2021-04-25 DIAGNOSIS — T451X5A Adverse effect of antineoplastic and immunosuppressive drugs, initial encounter: Secondary | ICD-10-CM | POA: Diagnosis not present

## 2021-04-25 DIAGNOSIS — A09 Infectious gastroenteritis and colitis, unspecified: Secondary | ICD-10-CM | POA: Diagnosis not present

## 2021-04-25 DIAGNOSIS — Z66 Do not resuscitate: Secondary | ICD-10-CM | POA: Diagnosis not present

## 2021-04-25 DIAGNOSIS — Z7901 Long term (current) use of anticoagulants: Secondary | ICD-10-CM | POA: Diagnosis not present

## 2021-04-25 DIAGNOSIS — Z8249 Family history of ischemic heart disease and other diseases of the circulatory system: Secondary | ICD-10-CM | POA: Diagnosis not present

## 2021-04-25 DIAGNOSIS — Z955 Presence of coronary angioplasty implant and graft: Secondary | ICD-10-CM | POA: Diagnosis not present

## 2021-04-25 DIAGNOSIS — D701 Agranulocytosis secondary to cancer chemotherapy: Secondary | ICD-10-CM | POA: Diagnosis not present

## 2021-04-25 DIAGNOSIS — C799 Secondary malignant neoplasm of unspecified site: Secondary | ICD-10-CM | POA: Diagnosis not present

## 2021-04-25 DIAGNOSIS — D649 Anemia, unspecified: Secondary | ICD-10-CM | POA: Diagnosis not present

## 2021-04-25 DIAGNOSIS — C3490 Malignant neoplasm of unspecified part of unspecified bronchus or lung: Secondary | ICD-10-CM | POA: Diagnosis not present

## 2021-04-25 DIAGNOSIS — Z20822 Contact with and (suspected) exposure to covid-19: Secondary | ICD-10-CM | POA: Diagnosis not present

## 2021-04-25 LAB — BASIC METABOLIC PANEL
Anion gap: 8 (ref 5–15)
BUN: 19 mg/dL (ref 8–23)
CO2: 25 mmol/L (ref 22–32)
Calcium: 8.6 mg/dL — ABNORMAL LOW (ref 8.9–10.3)
Chloride: 106 mmol/L (ref 98–111)
Creatinine, Ser: 1.11 mg/dL (ref 0.61–1.24)
GFR, Estimated: >60 mL/min (ref >60)
Glucose, Bld: 162 mg/dL — ABNORMAL HIGH (ref 70–99)
Potassium: 4.5 mmol/L (ref 3.5–5.1)
Sodium: 139 mmol/L (ref 135–145)

## 2021-04-25 LAB — MAGNESIUM: Magnesium: 1.9 mg/dL (ref 1.7–2.4)

## 2021-04-25 LAB — GASTROINTESTINAL PANEL BY PCR, STOOL (REPLACES STOOL CULTURE)

## 2021-04-25 LAB — CBC
HCT: 27.7 % — ABNORMAL LOW (ref 39.0–52.0)
Hemoglobin: 9.1 g/dL — ABNORMAL LOW (ref 13.0–17.0)
MCH: 28.9 pg (ref 26.0–34.0)
MCHC: 32.9 g/dL (ref 30.0–36.0)
MCV: 87.9 fL (ref 80.0–100.0)
Platelets: 229 10*3/uL (ref 150–400)
RBC: 3.15 MIL/uL — ABNORMAL LOW (ref 4.22–5.81)
RDW: 18.5 % — ABNORMAL HIGH (ref 11.5–15.5)
WBC: 5.7 10*3/uL (ref 4.0–10.5)
nRBC: 0 % (ref 0.0–0.2)

## 2021-04-25 LAB — PROCALCITONIN: Procalcitonin: 0.54 ng/mL

## 2021-04-25 LAB — C DIFFICILE QUICK SCREEN W PCR REFLEX
C Diff antigen: NEGATIVE
C Diff interpretation: NOT DETECTED
C Diff toxin: NEGATIVE

## 2021-04-25 MED ORDER — CHLORHEXIDINE GLUCONATE CLOTH 2 % EX PADS
6.0000 | MEDICATED_PAD | Freq: Every day | CUTANEOUS | Status: DC
  Administered 2021-04-25 – 2021-04-28 (×4): 6 via TOPICAL

## 2021-04-25 MED ORDER — SODIUM CHLORIDE 0.9 % IV SOLN
1.5000 g | Freq: Four times a day (QID) | INTRAVENOUS | Status: DC
  Administered 2021-04-25 – 2021-04-28 (×13): 1.5 g via INTRAVENOUS
  Filled 2021-04-25: qty 4
  Filled 2021-04-25 (×2): qty 1.5
  Filled 2021-04-25: qty 4
  Filled 2021-04-25: qty 1.5
  Filled 2021-04-25 (×3): qty 4
  Filled 2021-04-25 (×2): qty 1.5
  Filled 2021-04-25: qty 4
  Filled 2021-04-25: qty 1.5
  Filled 2021-04-25: qty 4
  Filled 2021-04-25 (×4): qty 1.5
  Filled 2021-04-25: qty 4
  Filled 2021-04-25: qty 1.5

## 2021-04-25 MED ORDER — IPRATROPIUM-ALBUTEROL 0.5-2.5 (3) MG/3ML IN SOLN: 3.0000 mL | Freq: Four times a day (QID) | RESPIRATORY_TRACT | Status: DC | PRN

## 2021-04-25 MED ORDER — SODIUM CHLORIDE 0.9 % IV SOLN
500.0000 mg | INTRAVENOUS | Status: AC
  Administered 2021-04-25 – 2021-04-27 (×3): 500 mg via INTRAVENOUS
  Filled 2021-04-25 (×4): qty 500

## 2021-04-25 MED ORDER — SODIUM CHLORIDE 0.9% FLUSH: 10.0000 mL | INTRAVENOUS | Status: DC | PRN

## 2021-04-25 NOTE — ED Notes (Signed)
CBC due at 5am, will hold off on additional order for H&H.

## 2021-04-25 NOTE — Evaluation (Signed)
Clinical/Bedside Swallow Evaluation Patient Details  Name: Jimmy West MRN: [id number] Date of Birth: 1951/05/07  Today's Date: 04/25/2021 Time: SLP Start Time (ACUTE ONLY): 1300 SLP Stop Time (ACUTE ONLY): 1355 SLP Time Calculation (min) (ACUTE ONLY): 55 min  Past Medical History:  Past Medical History:  Diagnosis Date   Bulging lumbar disc    Cancer ([place])    stage 4 lung cancer   Heart attack ([place])    Past Surgical History:  Past Surgical History:  Procedure Laterality Date   CARDIAC CATHETERIZATION     two stents   KNEE SURGERY Left    PORTA CATH INSERTION N/A 05/21/2018   Procedure: PORTA CATH INSERTION;  Surgeon: Algernon Huxley, MD;  Location: [place] [facility];  Service: Cardiovascular;  Laterality: N/A;   HPI:  Per admitting H&P "Jimmy West is a 70 y.o. 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     At bedside, Jimmy West is alert and oriented to self, age, current calendar year, and location.      He presents to the ED for shortness of breath that has been on/off for about two weeks.  Reports the shortness of breath is worse with exertion.  He endorses lack of energy.  Last evening, he states he felt that he had no strength to even get up to go the bathroom, this prompted him to call EMS for ED presentation. He reports wheezing in his throat.      He denies cough, fever, vomiting, loss of consciousness, syncope. He endorses nausea infrequently and states this has been his baseline since he started chemotherapy. He denies known sick contacts. He endorses loose/watery bowel for the last two weeks.      He endorses getting meals on wheels, however he  has been feeling so weak that he has had poor p.o. intake for the last 2 days.  Patient he endorses increased loose stools and watery stools in the last 3 days.     He endorses significant weight loss in the last 6 months though he does not know the exact weight    Assessment / Plan / Recommendation  Clinical Impression  Pt presents with mild dysphagia. He reports some difficulty swallowing since radiation treatment. Reports occasional coughing with liquids, but admits to drinking too fast and often when laying back in bed. Pt reports that when he "pays attention" he doesn't have trouble. He tolerated thin liquids, purees and soft solids without s/s of aspiration today.Single sips encouraged. Noted oral tranist delay with solid meats even when cut into bite sized pieces. Vocal quality reamined clear and laryngeal elevation appeared adequate. Aspiration precautions reviewed. rec Dys 3 diet (mechanical soft). Will add chopped meats and thin liquids taken in small sips. Meds one at a time with water or applesauce. ST to f/u with toleration of diet. Pt appreciative and agreeabable to follow swallowing recommendations. SLP Visit Diagnosis: Dysphagia, unspecified (R13.10)    Aspiration Risk  Mild aspiration risk    Diet Recommendation Dysphagia 3 (Mech soft);Thin liquid   Medication Administration: Whole meds with puree Supervision: Intermittent supervision to cue for compensatory strategies;Patient able to self feed Compensations: Minimize environmental distractions;Slow rate;Small sips/bites Postural Changes: Seated upright at 90  degrees;Remain upright for at least 30 minutes after po intake    Other  Recommendations      Recommendations for follow up therapy are one component of a multi-disciplinary discharge planning process, led by the attending physician.  Recommendations may be updated based on patient status, additional functional criteria and insurance authorization.  Follow up  Recommendations        Frequency and Duration   F/u 1-2 times as needed.         Prognosis Prognosis for Safe Diet Advancement: Good      Swallow Study   General Date of Onset: 04/24/21 HPI: Per admitting H&P "Jimmy West is a 70 y.o. 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     At bedside, Jimmy West is alert and oriented to self, age, current calendar year, and location.      He presents to the ED for shortness of breath that has been on/off for about two weeks.  Reports the shortness of breath is worse with exertion.  He endorses lack of energy.  Last evening, he states he felt that he had no strength to even get up to go the bathroom, this prompted him to call EMS for ED presentation. He reports wheezing in his throat.      He denies cough, fever, vomiting, loss of consciousness, syncope. He endorses nausea infrequently and states this has been his baseline since he started chemotherapy. He denies known sick contacts. He endorses loose/watery bowel for the last two weeks.      He endorses getting meals on wheels, however he has been feeling so weak that he has had poor p.o. intake for the last 2 days.  Patient he endorses increased loose stools and watery stools in the last 3 days.     He endorses significant weight loss in the last 6 months though he does not know the exact weight Type of Study: Bedside Swallow Evaluation Diet Prior to this Study: Nectar-thick liquids;Regular Temperature Spikes Noted: No Respiratory Status: Nasal cannula History of Recent Intubation: No Behavior/Cognition: Alert;Cooperative;Pleasant mood Oral Cavity Assessment: Within Functional  Limits Oral Care Completed by SLP: No Oral Cavity - Dentition: Dentures, bottom;Dentures, top Vision: Functional for self-feeding Self-Feeding Abilities: Able to feed self;Needs set up Patient Positioning: Upright in bed Baseline Vocal Quality: Breathy;Hoarse    Oral/Motor/Sensory Function Overall Oral Motor/Sensory Function: Within functional limits   Ice Chips Ice chips: Within functional limits Presentation: Spoon   Thin Liquid Thin Liquid: Within functional limits Presentation: Cup;Straw;Spoon    Nectar Thick Nectar Thick Liquid: Not tested   Honey Thick Honey Thick Liquid: Not tested   Puree Puree: Within functional limits Presentation: Spoon   Solid     Solid: Impaired Presentation: Self Fed Oral Phase Functional Implications: Prolonged oral transit      Lucila Maine 04/25/2021,1:57 PM

## 2021-04-25 NOTE — Progress Notes (Signed)
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 Started on IV unasyn and continue on IV steroids. Procal 0.54. Continue on bronchodilators and encourage incentive spirometry    Mild hypotension: secondary to anemia. MAP >65. Resolved    Acute on chronic anemia: likely secondary to recent chemo. S/p 3 units of pRBCs transfused. Will continue to monitor H&H    Chronic pain: secondary to metastatic carcinoma. Continue on oxycodone   Elevated troponins: likely secondary to demand ischemia not NSTEMI. Trending down    Diarrhea: secondary to EPEC. Started on IV azithromycin. C. diff was neg   Hypokalemia: WNL today   Stage IV lung adenocarcinoma: outpatient f/u w/ onco    Depression: severity unknown. Continue on home dose of fluoxetine, olanzapine  Anxiety: severity unknown. Continue on home dose of xanax    DVT prophylaxis: SCDs Code Status: DNR Family Communication:  Disposition Plan: depends on PT/OT recs   Level of care: Med-Surg  Status is: Observation  The patient will require care spanning > 2 midnights and should be moved to inpatient because: Unsafe d/c plan, IV treatments appropriate due to intensity of illness or inability to take PO, and Inpatient level of care appropriate due to severity of illness  Dispo: The patient is from: Home              Anticipated d/c is to: Home vs SNF              Patient currently is not medically stable to d/c.   Difficult to  place patient : unclear    Consultants:    Procedures:   Antimicrobials: unasyn, azithromycin    Subjective: Pt c/o malaise   Objective: Vitals:   04/25/21 0500 04/25/21 0530 04/25/21 0600 04/25/21 0630  BP: 105/77 121/89 (!) 123/92 119/73  Pulse: (!) 58 93 91 80  Resp: 18 (!) 24 (!) 23 (!) 24  Temp:      TempSrc:      SpO2: 98% 98% 95% 96%  Weight:      Height:        Intake/Output Summary (Last 24 hours) at 04/25/2021 0809 Last data filed at 04/25/2021 0542 Gross per 24 hour  Intake 1470 ml  Output --  Net 1470 ml   Filed Weights   04/24/21 1014  Weight: 59.4 kg    Examination:  General exam: Appears calm and comfortable  Respiratory system: diminished breath sounds b/l  Cardiovascular system: S1 & S2+. No rubs, gallops or clicks. No pedal edema. Gastrointestinal system: Abdomen is nondistended, soft and nontender. Normal bowel sounds heard. Central nervous system: Alert and oriented. Moves all extremities  Psychiatry: Judgement and insight appear normal. Mood & affect appropriate.     Data Reviewed: I have personally reviewed following labs and imaging studies  CBC: Recent Labs  Lab 04/24/21 1042 04/24/21 2120 04/25/21 0513  WBC 5.9  --  5.7  NEUTROABS 5.0  --   --   HGB 5.4* 7.4*  9.1*  HCT 17.0* 22.5* 27.7*  MCV 91.4  --  87.9  PLT 229  --  458   Basic Metabolic Panel: Recent Labs  Lab 04/24/21 1042 04/25/21 0513  NA 140 139  K 3.1* 4.5  CL 107 106  CO2 24 25  GLUCOSE 117* 162*  BUN 23 19  CREATININE 1.36* 1.11  CALCIUM 8.2* 8.6*  MG 1.8 1.9   GFR: Estimated Creatinine Clearance: 52 mL/min (by C-G formula based on SCr of 1.11 mg/dL). Liver Function Tests: Recent Labs  Lab 04/24/21 1042  AST 10*  ALT 9  ALKPHOS 63  BILITOT 1.1  PROT 5.8*  ALBUMIN 2.6*   No results for input(s): LIPASE, AMYLASE in the last 168 hours. No results for input(s): AMMONIA in the last 168 hours. Coagulation Profile: Recent Labs  Lab  04/24/21 1406  INR 1.4*   Cardiac Enzymes: No results for input(s): CKTOTAL, CKMB, CKMBINDEX, TROPONINI in the last 168 hours. BNP (last 3 results) No results for input(s): PROBNP in the last 8760 hours. HbA1C: No results for input(s): HGBA1C in the last 72 hours. CBG: No results for input(s): GLUCAP in the last 168 hours. Lipid Profile: No results for input(s): CHOL, HDL, LDLCALC, TRIG, CHOLHDL, LDLDIRECT in the last 72 hours. Thyroid Function Tests: Recent Labs    04/24/21 1042 04/24/21 1406  TSH 1.027 1.049  FREET4 0.97  --    Anemia Panel: Recent Labs    04/24/21 1042  FERRITIN 1,871*  TIBC 239*  IRON 47   Sepsis Labs: Recent Labs  Lab 04/24/21 1042 04/25/21 0513  PROCALCITON 0.17 0.54    Recent Results (from the past 240 hour(s))  Resp Panel by RT-PCR (Flu A&B, Covid) Nasopharyngeal Swab     Status: None   Collection Time: 04/24/21 10:42 AM   Specimen: Nasopharyngeal Swab; Nasopharyngeal(NP) swabs in vial transport medium  Result Value Ref Range Status   SARS Coronavirus 2 by RT PCR NEGATIVE NEGATIVE Final    Comment: (NOTE) SARS-CoV-2 target nucleic acids are NOT DETECTED.  The SARS-CoV-2 RNA is generally detectable in upper respiratory specimens during the acute phase of infection. The lowest concentration of SARS-CoV-2 viral copies this assay can detect is 138 copies/mL. A negative result does not preclude SARS-Cov-2 infection and should not be used as the sole basis for treatment or other patient management decisions. A negative result may occur with  improper specimen collection/handling, submission of specimen other than nasopharyngeal swab, presence of viral mutation(s) within the areas targeted by this assay, and inadequate number of viral copies(<138 copies/mL). A negative result must be combined with clinical observations, patient history, and epidemiological information. The expected result is Negative.  Fact Sheet for Patients:   EntrepreneurPulse.com.au  Fact Sheet for Healthcare Providers:  IncredibleEmployment.be  This test is no t yet approved or cleared by the [place] FDA and  has been authorized for detection and/or diagnosis of SARS-CoV-2 by FDA under an Emergency Use Authorization (EUA). This EUA will remain  in effect (meaning this test can be used) for the duration of the COVID-19 declaration under Section 564(b)(1) of the Act, 21 U.S.C.section 360bbb-3(b)(1), unless the authorization is terminated  or revoked sooner.       Influenza A by PCR NEGATIVE NEGATIVE Final   Influenza B by PCR NEGATIVE NEGATIVE Final    Comment: (NOTE) The Xpert Xpress SARS-CoV-2/FLU/RSV plus assay is intended as an aid in the diagnosis of influenza from Nasopharyngeal swab specimens and should not be used as a  sole basis for treatment. Nasal washings and aspirates are unacceptable for Xpert Xpress SARS-CoV-2/FLU/RSV testing.  Fact Sheet for Patients: EntrepreneurPulse.com.au  Fact Sheet for Healthcare Providers: IncredibleEmployment.be  This test is not yet approved or cleared by the [place] FDA and has been authorized for detection and/or diagnosis of SARS-CoV-2 by FDA under an Emergency Use Authorization (EUA). This EUA will remain in effect (meaning this test can be used) for the duration of the COVID-19 declaration under Section 564(b)(1) of the Act, 21 U.S.C. section 360bbb-3(b)(1), unless the authorization is terminated or revoked.  Performed at [facility], [address] [place], [place] [address]          Radiology Studies: DG Chest 2 View  Result Date: 04/24/2021 CLINICAL DATA:  Shortness of breath. History of stage IV lung cancer. EXAM: CHEST - 2 VIEW COMPARISON:  Chest radiographs 09/18/2020 and CT 11/01/2020 FINDINGS: A right jugular Port-A-Cath remains in place terminating over the right atrium.  The cardiac silhouette is within normal limits in size. There is volume loss in the hemithorax with increasing confluent and patchy opacity in the right perihilar region and right lung base which partially obscures the known superior segment right lower lobe mass. There may be a trace right pleural effusion. The interstitial markings are mildly prominent throughout the left lung without focal consolidation. No pneumothorax is identified. No acute osseous abnormality is seen. IMPRESSION: Increasing right lower lung consolidation and volume loss. Electronically Signed   By: Logan Bores M.D.   On: 04/24/2021 11:36        Scheduled Meds:  FLUoxetine  40 mg Oral Daily   methylPREDNISolone (SOLU-MEDROL) injection  40 mg Intravenous Q12H   OLANZapine  10 mg Oral QHS   pantoprazole (PROTONIX) IV  40 mg Intravenous Once   pantoprazole (PROTONIX) IV  40 mg Intravenous Once   Continuous Infusions:  pantoprazole (PROTONIX) IV Stopped (04/25/21 0542)     LOS: 0 days    Time spent: 33 mins     Wyvonnia Dusky, MD [facility] Pager [phone]  If 7PM-7AM, please contact night-coverage 04/25/2021, 8:09 AM

## 2021-04-25 NOTE — ED Notes (Signed)
Message sent to pharmacy for Protonix gtt as it has not been received.

## 2021-04-25 NOTE — Progress Notes (Signed)
Patient arrived to unit in stable condition.

## 2021-04-25 NOTE — Plan of Care (Signed)
Dr. Informed of stool sample being positive for ecoli.

## 2021-04-26 ENCOUNTER — Other Ambulatory Visit: Payer: Self-pay | Admitting: *Deleted

## 2021-04-26 DIAGNOSIS — C3431 Malignant neoplasm of lower lobe, right bronchus or lung: Secondary | ICD-10-CM

## 2021-04-26 DIAGNOSIS — D649 Anemia, unspecified: Secondary | ICD-10-CM

## 2021-04-26 DIAGNOSIS — A09 Infectious gastroenteritis and colitis, unspecified: Secondary | ICD-10-CM | POA: Diagnosis not present

## 2021-04-26 DIAGNOSIS — J189 Pneumonia, unspecified organism: Secondary | ICD-10-CM | POA: Diagnosis not present

## 2021-04-26 LAB — CBC
HCT: 25.9 % — ABNORMAL LOW (ref 39.0–52.0)
Hemoglobin: 8.8 g/dL — ABNORMAL LOW (ref 13.0–17.0)
MCH: 30.4 pg (ref 26.0–34.0)
MCHC: 34 g/dL (ref 30.0–36.0)
MCV: 89.6 fL (ref 80.0–100.0)
Platelets: 250 10*3/uL (ref 150–400)
RBC: 2.89 MIL/uL — ABNORMAL LOW (ref 4.22–5.81)
RDW: 18.9 % — ABNORMAL HIGH (ref 11.5–15.5)
WBC: 6.5 10*3/uL (ref 4.0–10.5)
nRBC: 0 % (ref 0.0–0.2)

## 2021-04-26 LAB — BPAM RBC
Blood Product Expiration Date: 202211022359
Blood Product Expiration Date: 202211032359
Blood Product Expiration Date: 202211042359
ISSUE DATE / TIME: 202210051554
ISSUE DATE / TIME: 202210051810
ISSUE DATE / TIME: 202210060140
Unit Type and Rh: 5100
Unit Type and Rh: 5100
Unit Type and Rh: 5100

## 2021-04-26 LAB — TYPE AND SCREEN
ABO/RH(D): O POS
Antibody Screen: NEGATIVE
Unit division: 0
Unit division: 0
Unit division: 0

## 2021-04-26 LAB — BASIC METABOLIC PANEL
Anion gap: 6 (ref 5–15)
BUN: 24 mg/dL — ABNORMAL HIGH (ref 8–23)
CO2: 25 mmol/L (ref 22–32)
Calcium: 8.9 mg/dL (ref 8.9–10.3)
Chloride: 108 mmol/L (ref 98–111)
Creatinine, Ser: 1.24 mg/dL (ref 0.61–1.24)
GFR, Estimated: >60 mL/min (ref >60)
Glucose, Bld: 120 mg/dL — ABNORMAL HIGH (ref 70–99)
Potassium: 4.3 mmol/L (ref 3.5–5.1)
Sodium: 139 mmol/L (ref 135–145)

## 2021-04-26 LAB — VITAMIN B12: Vitamin B-12: 1308 pg/mL — ABNORMAL HIGH (ref 180–914)

## 2021-04-26 LAB — PROCALCITONIN: Procalcitonin: 0.28 ng/mL

## 2021-04-26 MED ORDER — OXYCODONE HCL 5 MG PO TABS: 5.0000 mg | ORAL_TABLET | ORAL | Status: DC | PRN

## 2021-04-26 MED ORDER — METHYLPREDNISOLONE SODIUM SUCC 40 MG IJ SOLR
40.0000 mg | Freq: Two times a day (BID) | INTRAMUSCULAR | Status: DC
  Administered 2021-04-26 – 2021-04-27 (×4): 40 mg via INTRAVENOUS
  Filled 2021-04-26 (×4): qty 1

## 2021-04-26 MED ORDER — OXYCODONE HCL 5 MG PO TABS
20.0000 mg | ORAL_TABLET | ORAL | Status: DC | PRN
Start: 2021-04-26 — End: 2021-04-28
  Administered 2021-04-26 – 2021-04-28 (×4): 20 mg via ORAL
  Filled 2021-04-26 (×6): qty 4

## 2021-04-26 MED ORDER — OXYCODONE HCL 5 MG PO TABS: 10.0000 mg | ORAL_TABLET | ORAL | Status: DC | PRN

## 2021-04-26 NOTE — TOC Initial Note (Signed)
Transition of Care [facility]) - Initial/Assessment Note    Patient Details  Name: Jimmy West MRN: [id number] Date of Birth: 11-14-1950  [facility] of [facility]) CM/SW Contact:    Jimmy Hutching, RN Phone Number: 04/26/2021, 2:03 PM  Clinical Narrative:                 Patient admitted to the hospital with shortness of breath, history of lung cancer.  Patient is currently on acute O2 and getting IV antibiotics.  RNCM met with patient at the bedside.  Patient is from home in [place] where he lives alone and is independent in ADL's and drives.  He does not have a PCP currently but is being followed by the [facility] closely.  Patient has no trouble affording his medications as the [facility] has a grant that helps with the cost over at [facility].   TOC will follow through discharge and assist with disposition if needed.    Expected Discharge Plan: Home/Self Care Barriers to Discharge: Continued Medical Work up   Patient Goals and CMS Choice Patient states their goals for this hospitalization and ongoing recovery are:: wants to get back home      Expected Discharge Plan and Services Expected Discharge Plan: Home/Self Care       Living arrangements for the past 2 months: Mobile Home                 DME Arranged: N/A DME Agency: NA       HH Arranged: NA          Prior Living Arrangements/Services Living arrangements for the past 2 months: Mobile Home Lives with:: Self Patient language and need for interpreter reviewed:: Yes Do you feel safe going back to the place where you live?: Yes      Need for Family Participation in Patient Care: Yes (Comment) Care giver support system in place?: Yes (comment) (friends)   Criminal Activity/Legal Involvement Pertinent to Current Situation/Hospitalization: No - Comment as needed  Activities of Daily Living Home Assistive Devices/Equipment: None ADL Screening (condition at time of admission) Patient's cognitive  ability adequate to safely complete daily activities?: No Is the patient deaf or have difficulty hearing?: No Does the patient have difficulty seeing, even when wearing glasses/contacts?: No Does the patient have difficulty concentrating, remembering, or making decisions?: No Patient able to express need for assistance with ADLs?: Yes Does the patient have difficulty dressing or bathing?: No Independently performs ADLs?: Yes (appropriate for developmental age) Does the patient have difficulty walking or climbing stairs?: No Weakness of Legs: None Weakness of Arms/Hands: None  Permission Sought/Granted   Permission granted to share information with : No              Emotional Assessment Appearance:: Appears stated age Attitude/Demeanor/Rapport: Engaged Affect (typically observed): Accepting Orientation: : Oriented to Self, Oriented to Place, Oriented to  Time, Oriented to Situation Alcohol / Substance Use: Not Applicable Psych Involvement: No (comment)  Admission diagnosis:  Symptomatic anemia [D64.9] Malignant neoplasm of right lung, unspecified part of lung (HCC) [C34.91] Acute anemia [D64.9] Pneumonia [J18.9] Patient Active Problem List   Diagnosis Date Noted   Pneumonia 04/25/2021   Acute anemia 04/24/2021   Acute hypokalemia 04/24/2021   NSTEMI (non-ST elevated myocardial infarction) ([place]) 04/24/2021   Diarrhea 04/24/2021   Hypercalcemia 11/26/2020   Nonintractable headache 07/01/2020   Chronic anticoagulation 05/02/2020   Pleuritic pain 03/26/2020   Chemotherapy-induced neutropenia ([place]) 01/06/2020   Depression  12/21/2019   Palliative care encounter 08/26/2019   Anemia due to antineoplastic chemotherapy 06/27/2019   C. difficile diarrhea 06/26/2019   Pulmonary embolus ([place]) 06/20/2019   Anxiety in cancer patient 03/29/2019   Renal insufficiency 03/29/2019   Other fatigue 03/04/2019   Vomiting 03/04/2019   Chemotherapy induced nausea and vomiting 02/12/2019    Abdominal pain 02/11/2019   Mild protein-calorie malnutrition ([place]) 10/10/2018   Weight loss 09/20/2018   Elevated bilirubin 07/14/2018   Encounter for antineoplastic chemotherapy 05/24/2018   Encounter for antineoplastic immunotherapy 05/24/2018   Goals of care, counseling/discussion 05/17/2018   Cancer-related pain 05/01/2018   Bone metastasis ([place]) 04/30/2018   Primary cancer of right lower lobe of lung ([place]) 04/26/2018   Chronic midline low back pain without sciatica 04/26/2018   PCP:  Jimmy West, No Pharmacy:   [facility], [place] [address] [place] [address] Phone: [phone] Fax: [facility], [address] [place] [address] Phone: [phone] Fax: [phone]     Social Determinants of Health ([place]) Interventions    Readmission Risk Interventions Readmission Risk Prevention Plan 04/26/2021  Transportation Screening Complete  PCP or Specialist Appt within 3-5 Days Complete  HRI or [place] Complete  Social Work Consult for [place] Planning/Counseling Complete  Palliative Care Screening Not Applicable  Medication Review Press photographer) Complete  Some recent data might be hidden

## 2021-04-26 NOTE — Progress Notes (Signed)
PROGRESS NOTE    Jimmy West  XKG:[id number] DOB: 08/29/1950 DOA: 04/24/2021 PCP: Pcp, No    Assessment & Plan:   Principal Problem:   Acute anemia Active Problems:   Primary cancer of right lower lobe of lung (HCC)   Cancer-related pain   Chemotherapy induced nausea and vomiting   Anxiety in cancer patient   Depression   Chemotherapy-induced neutropenia (HCC)   Pleuritic pain   Chronic anticoagulation   Acute hypokalemia   NSTEMI (non-ST elevated myocardial infarction) ([place])   Diarrhea   Pneumonia  Right lower lobe pneumonia: possibly aspiration. Continue on IV unasyn, IV steroids, bronchodilators and encourage incentive spirometry. Procal 0.54   Mild hypotension: secondary to anemia. MAP >65. Resolved    Acute on chronic normocytic anemia: likely secondary to recent chemo. S/p 3 units of pRBCs transfused. H&H is labile.    Chronic pain: likely secondary to metastatic carcinoma. Continue on on oxycodone   Elevated troponins: likely secondary to demand ischemia not NSTEMI. Trending down    Diarrhea: secondary to EPEC. Continue on IV azithromycin. C. diff was neg  Hypokalemia: within normal limits   Stage IV lung adenocarcinoma: outpatient f/u w/ onco    Depression: severity unknown. Continue on home dose of fluoxetine, olanzapine  Anxiety: severity unknown. Continue on home dose of xanax      DVT prophylaxis: SCDs Code Status: DNR Family Communication:  Disposition Plan: depends on PT/OT recs   Level of care: Med-Surg  Status is: Observation  The patient will require care spanning > 2 midnights and should be moved to inpatient because: Unsafe d/c plan, IV treatments appropriate due to intensity of illness or inability to take PO, and Inpatient level of care appropriate due to severity of illness  Dispo: The patient is from: Home              Anticipated d/c is to: Home vs SNF              Patient currently is not medically stable to d/c.    Difficult to place patient : unclear    Consultants:    Procedures:   Antimicrobials: unasyn, azithromycin    Subjective: Pt c/o shortness of breath w/ exertion   Objective: Vitals:   04/25/21 1436 04/25/21 1722 04/25/21 2045 04/26/21 0514  BP: 121/83 120/81 112/79 (!) 90/57  Pulse: 72 77 87 (!) 52  Resp: 20 18 16 16   Temp: 97.9 F (36.6 C) 98.2 F (36.8 C) 97.7 F (36.5 C) (!) 97.5 F (36.4 C)  TempSrc: Oral Oral    SpO2: 96% 97% 93% 92%  Weight:      Height:        Intake/Output Summary (Last 24 hours) at 04/26/2021 0731 Last data filed at 04/26/2021 0031 Gross per 24 hour  Intake 300 ml  Output 300 ml  Net 0 ml   Filed Weights   04/24/21 1014  Weight: 59.4 kg    Examination:  General exam: Appears calm but uncomfortable  Respiratory system: decreased breath sounds b/l  Cardiovascular system: S1/S2+. No rubs or clicks  Gastrointestinal system: Abd is soft, NT, ND & hyperactive bowel sounds  Central nervous system: Alert and oriented. Moves all extremities   Psychiatry: Judgement and insight appear normal. Flat mood and affect    Data Reviewed: I have personally reviewed following labs and imaging studies  CBC: Recent Labs  Lab 04/24/21 1042 04/24/21 2120 04/25/21 0513 04/26/21 0610  WBC 5.9  --  5.7 6.5  NEUTROABS 5.0  --   --   --   HGB 5.4* 7.4* 9.1* 8.8*  HCT 17.0* 22.5* 27.7* 25.9*  MCV 91.4  --  87.9 89.6  PLT 229  --  229 256   Basic Metabolic Panel: Recent Labs  Lab 04/24/21 1042 04/25/21 0513 04/26/21 0610  NA 140 139 139  K 3.1* 4.5 4.3  CL 107 106 108  CO2 24 25 25   GLUCOSE 117* 162* 120*  BUN 23 19 24*  CREATININE 1.36* 1.11 1.24  CALCIUM 8.2* 8.6* 8.9  MG 1.8 1.9  --    GFR: Estimated Creatinine Clearance: 46.6 mL/min (by C-G formula based on SCr of 1.24 mg/dL). Liver Function Tests: Recent Labs  Lab 04/24/21 1042  AST 10*  ALT 9  ALKPHOS 63  BILITOT 1.1  PROT 5.8*  ALBUMIN 2.6*   No results for input(s):  LIPASE, AMYLASE in the last 168 hours. No results for input(s): AMMONIA in the last 168 hours. Coagulation Profile: Recent Labs  Lab 04/24/21 1406  INR 1.4*   Cardiac Enzymes: No results for input(s): CKTOTAL, CKMB, CKMBINDEX, TROPONINI in the last 168 hours. BNP (last 3 results) No results for input(s): PROBNP in the last 8760 hours. HbA1C: No results for input(s): HGBA1C in the last 72 hours. CBG: No results for input(s): GLUCAP in the last 168 hours. Lipid Profile: No results for input(s): CHOL, HDL, LDLCALC, TRIG, CHOLHDL, LDLDIRECT in the last 72 hours. Thyroid Function Tests: Recent Labs    04/24/21 1042 04/24/21 1406  TSH 1.027 1.049  FREET4 0.97  --    Anemia Panel: Recent Labs    04/24/21 1042  FERRITIN 1,871*  TIBC 239*  IRON 47   Sepsis Labs: Recent Labs  Lab 04/24/21 1042 04/25/21 0513 04/26/21 0610  PROCALCITON 0.17 0.54 0.28    Recent Results (from the past 240 hour(s))  Resp Panel by RT-PCR (Flu A&B, Covid) Nasopharyngeal Swab     Status: None   Collection Time: 04/24/21 10:42 AM   Specimen: Nasopharyngeal Swab; Nasopharyngeal(NP) swabs in vial transport medium  Result Value Ref Range Status   SARS Coronavirus 2 by RT PCR NEGATIVE NEGATIVE Final    Comment: (NOTE) SARS-CoV-2 target nucleic acids are NOT DETECTED.  The SARS-CoV-2 RNA is generally detectable in upper respiratory specimens during the acute phase of infection. The lowest concentration of SARS-CoV-2 viral copies this assay can detect is 138 copies/mL. A negative result does not preclude SARS-Cov-2 infection and should not be used as the sole basis for treatment or other patient management decisions. A negative result may occur with  improper specimen collection/handling, submission of specimen other than nasopharyngeal swab, presence of viral mutation(s) within the areas targeted by this assay, and inadequate number of viral copies(<138 copies/mL). A negative result must be  combined with clinical observations, patient history, and epidemiological information. The expected result is Negative.  Fact Sheet for Patients:  EntrepreneurPulse.com.au  Fact Sheet for Healthcare Providers:  IncredibleEmployment.be  This test is no t yet approved or cleared by the [place] FDA and  has been authorized for detection and/or diagnosis of SARS-CoV-2 by FDA under an Emergency Use Authorization (EUA). This EUA will remain  in effect (meaning this test can be used) for the duration of the COVID-19 declaration under Section 564(b)(1) of the Act, 21 U.S.C.section 360bbb-3(b)(1), unless the authorization is terminated  or revoked sooner.       Influenza A by PCR NEGATIVE NEGATIVE Final   Influenza B by PCR  NEGATIVE NEGATIVE Final    Comment: (NOTE) The Xpert Xpress SARS-CoV-2/FLU/RSV plus assay is intended as an aid in the diagnosis of influenza from Nasopharyngeal swab specimens and should not be used as a sole basis for treatment. Nasal washings and aspirates are unacceptable for Xpert Xpress SARS-CoV-2/FLU/RSV testing.  Fact Sheet for Patients: EntrepreneurPulse.com.au  Fact Sheet for Healthcare Providers: IncredibleEmployment.be  This test is not yet approved or cleared by the [place] FDA and has been authorized for detection and/or diagnosis of SARS-CoV-2 by FDA under an Emergency Use Authorization (EUA). This EUA will remain in effect (meaning this test can be used) for the duration of the COVID-19 declaration under Section 564(b)(1) of the Act, 21 U.S.C. section 360bbb-3(b)(1), unless the authorization is terminated or revoked.  Performed at [facility], [place], [place] [address]   C Difficile Quick Screen w PCR reflex     Status: None   Collection Time: 04/25/21 11:40 AM   Specimen: STOOL  Result Value Ref Range Status   C Diff antigen  NEGATIVE NEGATIVE Final   C Diff toxin NEGATIVE NEGATIVE Final   C Diff interpretation No C. difficile detected.  Final    Comment: Performed at [facility], [address] [place], [place] [address]  Gastrointestinal Panel by PCR , Stool     Status: Abnormal   Collection Time: 04/25/21 11:40 AM   Specimen: Stool  Result Value Ref Range Status   Campylobacter species NOT DETECTED NOT DETECTED Final   Plesimonas shigelloides NOT DETECTED NOT DETECTED Final   Salmonella species NOT DETECTED NOT DETECTED Final   Yersinia enterocolitica NOT DETECTED NOT DETECTED Final   Vibrio species NOT DETECTED NOT DETECTED Final   Vibrio cholerae NOT DETECTED NOT DETECTED Final   Enteroaggregative E coli (EAEC) NOT DETECTED NOT DETECTED Final   Enteropathogenic E coli (EPEC) DETECTED (A) NOT DETECTED Final    Comment: RESULT CALLED TO, READ BACK BY AND VERIFIED WITH: MATHEW WRIGHT @1340  04/25/21 MJU    Enterotoxigenic E coli (ETEC) NOT DETECTED NOT DETECTED Final   Shiga like toxin producing E coli (STEC) NOT DETECTED NOT DETECTED Final   Shigella/Enteroinvasive E coli (EIEC) NOT DETECTED NOT DETECTED Final   Cryptosporidium NOT DETECTED NOT DETECTED Final   Cyclospora cayetanensis NOT DETECTED NOT DETECTED Final   Entamoeba histolytica NOT DETECTED NOT DETECTED Final   Giardia lamblia NOT DETECTED NOT DETECTED Final   Adenovirus F40/41 NOT DETECTED NOT DETECTED Final   Astrovirus NOT DETECTED NOT DETECTED Final   Norovirus GI/GII NOT DETECTED NOT DETECTED Final   Rotavirus A NOT DETECTED NOT DETECTED Final   Sapovirus (I, II, IV, and V) NOT DETECTED NOT DETECTED Final    Comment: Performed at [facility], [address], [place], [place] [address]         Radiology Studies: DG Chest 2 View  Result Date: 04/24/2021 CLINICAL DATA:  Shortness of breath. History of stage IV lung cancer. EXAM: CHEST - 2 VIEW COMPARISON:  Chest radiographs 09/18/2020 and CT 11/01/2020  FINDINGS: A right jugular Port-A-Cath remains in place terminating over the right atrium. The cardiac silhouette is within normal limits in size. There is volume loss in the hemithorax with increasing confluent and patchy opacity in the right perihilar region and right lung base which partially obscures the known superior segment right lower lobe mass. There may be a trace right pleural effusion. The interstitial markings are mildly prominent throughout the left lung without focal consolidation.  No pneumothorax is identified. No acute osseous abnormality is seen. IMPRESSION: Increasing right lower lung consolidation and volume loss. Electronically Signed   By: Logan Bores M.D.   On: 04/24/2021 11:36        Scheduled Meds:  Chlorhexidine Gluconate Cloth  6 each Topical Daily   FLUoxetine  40 mg Oral Daily   OLANZapine  10 mg Oral QHS   pantoprazole (PROTONIX) IV  40 mg Intravenous Once   pantoprazole (PROTONIX) IV  40 mg Intravenous Once   Continuous Infusions:  ampicillin-sulbactam (UNASYN) IV 1.5 g (04/26/21 5051)   azithromycin 500 mg (04/25/21 1628)     LOS: 1 day    Time spent: 30 mins     Wyvonnia Dusky, MD [facility] Pager [phone]  If 7PM-7AM, please contact night-coverage 04/26/2021, 7:31 AM

## 2021-04-26 NOTE — Progress Notes (Signed)
Speech Language Pathology Treatment: Dysphagia  Patient Details Name: Jimmy West MRN: [id number] DOB: 1950-08-24 Today's Date: 04/26/2021 Time: 1356-1410 SLP Time Calculation (min) (ACUTE ONLY): 14 min  Assessment / Plan / Recommendation Clinical Impression  Pt seen for ongoing safety with thin liquids. Skilled observation was provided of pt consuming thin liquids via cup. Pt was sitting upright in recliner. Pt was free of overt s/s of aspiration with his swallow appearing swift. Pt's vitals remained stable and his vocal quality was clear. Pt states that he has the most trouble swallowing when I eat and drink in bed." Extensive education provided on being out of bed and sitting upright when eating and drinking. While pt voices understanding, it is not known if he will adhere with aspiration precautions at discharge.   At this time, skilled ST intervention is no longer indicated.     HPI HPI: Per admitting H&P "Jimmy West is a 70 y.o. 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     At bedside, Jimmy West is alert and oriented to self, age, current calendar year, and location.      He presents to the ED for shortness of breath that has been on/off for about two weeks.  Reports the shortness of breath is worse with exertion.  He endorses lack of energy.  Last evening, he states he felt that he had no strength to even get up to go the bathroom, this prompted him to call EMS for ED presentation. He reports wheezing in his throat.      He denies cough, fever, vomiting, loss of consciousness, syncope. He endorses nausea infrequently and states this has been his baseline  since he started chemotherapy. He denies known sick contacts. He endorses loose/watery bowel for the last two weeks.      He endorses getting meals on wheels, however he has been feeling so weak that he has had poor p.o. intake for the last 2 days.  Patient he endorses increased loose stools and watery stools in the last 3 days.     He endorses significant weight loss in the last 6 months though he does not know the exact weight      SLP Plan  All goals met      Recommendations for follow up therapy are one component of a multi-disciplinary discharge planning process, led by the attending physician.  Recommendations may be updated based on patient status, additional functional criteria and insurance authorization.    Recommendations  Diet recommendations: Dysphagia 3 (mechanical soft);Thin liquid Liquids provided via: Cup Medication Administration: Whole meds with puree Supervision: Patient able to self feed Compensations: Minimize environmental distractions;Slow rate;Small sips/bites Postural Changes and/or Swallow Maneuvers: Out of bed for meals;Seated upright 90 degrees                Oral Care Recommendations: Oral care BID Follow up Recommendations: None SLP Visit Diagnosis: Dysphagia, unspecified (R13.10) Plan: All goals met       GO              Jimmy Reasons B. Jimmy West M.S., CCC-SLP, [place] Office [phone]   Stormy Fabian  04/26/2021, 2:52 PM

## 2021-04-26 NOTE — Evaluation (Signed)
Occupational Therapy Evaluation Patient Details Name: Jimmy West MRN: [id number] DOB: [birth year] Today's Date: 04/26/2021   History of Present Illness 70 y.o. 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   Clinical Impression   Upon entering the room,pt supine in bed and fully dressed. Pt reports living at home independently without use of AD at baseline. Pt reports driving and receiving meals on wheels. He does have family/friends that can assist intermittently. Pt ambulates and performs self care tasks  in room this session without assistance and managing his own IV pole. Pt O2 desats to 80% on RA and placed on 2Ls to recover to 93% this session. RN notified. OT discussed energy conservation for home with pt reporting he already tries to implement many of these strategies already. All needs within reach. Pt does not need skilled OT intervention. OT to SIGN OFF.      Recommendations for follow up therapy are one component of a multi-disciplinary discharge planning process, led by the attending physician.  Recommendations may be updated based on patient status, additional functional criteria and insurance authorization.   Follow Up Recommendations  No OT follow up    Equipment Recommendations  3 in 1 bedside commode       Precautions / Restrictions Precautions Precautions: Fall      Mobility Bed Mobility Overal bed mobility: Modified Independent             General bed mobility comments: HOB elevated and increased time    Transfers Overall transfer level: Modified independent               General transfer comment: no physical assistance  provided    Balance Overall balance assessment: Modified Independent                                         ADL either performed or assessed with clinical judgement   ADL Overall ADL's : Modified independent                                       General ADL Comments: Pt holds onto IV pole but does not need any physical assist this session. He does fatigue quickly.     Vision Patient Visual Report: No change from baseline              Pertinent Vitals/Pain Pain Assessment: No/denies pain     Hand Dominance Right   Extremity/Trunk Assessment Upper Extremity Assessment Upper Extremity Assessment: Overall WFL for tasks assessed   Lower Extremity Assessment Lower Extremity Assessment: Defer to PT evaluation       Communication Communication Communication: No difficulties   Cognition Arousal/Alertness: Awake/alert Behavior During Therapy: WFL for tasks assessed/performed Overall Cognitive Status: Within Functional Limits for tasks assessed                                 General Comments: Pt is very pleasant  Home Living Family/patient expects to be discharged to:: Private residence Living Arrangements: Alone Available Help at Discharge: Family;Friend(s);Available PRN/intermittently Type of Home: Mobile home Home Access: Stairs to enter Entrance Stairs-Number of Steps: 5 Entrance Stairs-Rails: Right;Left;Can reach both Home Layout: One level     Bathroom Shower/Tub: Walk-in shower         Home Equipment: None          Prior Functioning/Environment Level of Independence: Independent        Comments: Pt reports he fatigues easily and has been having a more difficult time with IADL tasks. He does get meals on wheels. He endorses one fall this year.                 OT Goals(Current goals can be found in the care plan section) Acute Rehab OT Goals Patient Stated Goal: to go home OT  Goal Formulation: With patient Time For Goal Achievement: 04/26/21 Potential to Achieve Goals: Good   AM-PAC OT "6 Clicks" Daily Activity     Outcome Measure Help from another person eating meals?: None Help from another person taking care of personal grooming?: None Help from another person toileting, which includes using toliet, bedpan, or urinal?: None Help from another person bathing (including washing, rinsing, drying)?: None Help from another person to put on and taking off regular upper body clothing?: None Help from another person to put on and taking off regular lower body clothing?: None 6 Click Score: 24   End of Session Equipment Utilized During Treatment: Oxygen (Pt placed on 2 Ls) Nurse Communication: Mobility status;Other (comment) (O2 desaturation with mobility and self care tasks)  Activity Tolerance: Patient tolerated treatment well Patient left: in bed;with call bell/phone within reach;with bed alarm set                   Time: 2440-1027 OT Time Calculation (min): 19 min Charges:  OT General Charges $OT Visit: 1 Visit OT Evaluation $OT Eval Low Complexity: 1 Low OT Treatments $Self Care/Home Management : 8-22 mins  Darleen Crocker, MS, OTR/L , CBIS ascom [phone]  04/26/21, 2:43 PM

## 2021-04-26 NOTE — Evaluation (Signed)
Physical Therapy Evaluation Patient Details Name: Jimmy West MRN: [id number] DOB: [birth year] Today's Date: 04/26/2021  History of Present Illness  Jimmy West is a 70 y.o. 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  Clinical Impression  Pt is a pleasant 70 year old male who presents to PT evaluation after admission for shortness of breath and weakness. Prior to admission, pt was independent in the home and denies usage of any assistive devices. Pt does endorse 1 fall two weeks ago in his home. Currently, pt able to perform bed mobility and transfers at mod I level, and ambulated 40 ft without device and CGA. Pt observed to cross his feet during ambulation but able to maintain balance with CGA. Pt demonstrated desaturation to 85% during ambulation with 1 L of oxygen. Pt required a sitting rest of >3 minutes with 2 L with cues for breathing to reach SpO2 >93%. Recommending home with home health PT to improve balance and trial assistive devices to reduce fall risk. Will continue to benefit from skilled PT during hospitalization to improve overall function.     Recommendations for follow up therapy are one component of a multi-disciplinary discharge planning process, led by the attending physician.  Recommendations may be updated based on patient status, additional functional criteria and insurance authorization.  Follow Up Recommendations Home health PT;Supervision - Intermittent    Equipment Recommendations  Cane    Recommendations for Other Services       Precautions / Restrictions Precautions Precautions: Fall Restrictions Weight Bearing Restrictions: No       Mobility  Bed Mobility Overal bed mobility: Modified Independent             General bed mobility comments: HOB elevated + verbal cues    Transfers Overall transfer level: Modified independent Equipment used: None             General transfer comment: stood without assistance. B usage of UEs on knees  Ambulation/Gait Ambulation/Gait assistance: Min guard Gait Distance (Feet): 40 Feet Assistive device: None Gait Pattern/deviations: Step-through pattern;Decreased stride length;Narrow base of support     General Gait Details: Pt crossing his feet x 2 throughout gait trail but able to maintain balance with CGA. Pt ambulates with decreased stride length and gait speed  Stairs            Wheelchair Mobility    Modified Rankin (Stroke Patients Only)       Balance Overall balance assessment: Needs assistance Sitting-balance support: No upper extremity supported;Feet supported Sitting balance-Leahy Scale: Normal     Standing balance support: No upper extremity supported;During functional activity Standing balance-Leahy Scale: Good Standing balance comment: Crossing feet with ambulation but able to maintain balance with CGA                             Pertinent Vitals/Pain Pain Assessment: No/denies pain    Home Living Family/patient expects to be discharged to:: Private residence Living Arrangements: Alone Available Help at Discharge: Family;Friend(s);Available PRN/intermittently Type of Home: Mobile home Home Access: Stairs to enter Entrance Stairs-Rails: Right;Left;Can reach both Entrance Stairs-Number of Steps: 5 Home Layout: One level   Additional Comments: Pt reports  that he had 1 fall 2 weeks ago in his home.    Prior Function Level of Independence: Independent         Comments: Pt reports that he was interested in purchasing a cane to help with his balance after a fall two weeks ago     Hand Dominance         Extremity/Trunk Assessment   Upper Extremity Assessment Upper Extremity Assessment: Defer to OT evaluation    Lower Extremity Assessment Lower Extremity Assessment: Overall WFL for tasks assessed       Communication   Communication: No difficulties  Cognition Arousal/Alertness: Awake/alert Behavior During Therapy: WFL for tasks assessed/performed Overall Cognitive Status: Within Functional Limits for tasks assessed                                 General Comments: Pt pleasant and cooperative throughout evaluation      General Comments      Exercises Other Exercises Other Exercises: Ambuated without AD and montiored SpO2. Resting HR 94, 93% O2 on Room Air. Walking: 101 HR, 85% O2 room air. Sitting EOB 88% Room air, increased to 1L 90% 2 minute wait, increased to 2L >93% with additional 2 minutes sitting EOB. Pt left with 1L of oxygen on >93% sitting in recliner with all needs within reach.   Assessment/Plan    PT Assessment Patient needs continued PT services  PT Problem List Decreased strength;Decreased balance;Decreased knowledge of use of DME;Cardiopulmonary status limiting activity       PT Treatment Interventions DME instruction;Gait training;Stair training;Functional mobility training;Therapeutic activities;Therapeutic exercise;Balance training;Neuromuscular re-education;Patient/family education    PT Goals (Current goals can be found in the Care Plan section)  Acute Rehab PT Goals Patient Stated Goal: to go home PT Goal Formulation: With patient Time For Goal Achievement: 05/10/21 Potential to Achieve Goals: Good    Frequency Min 2X/week   Barriers to discharge        Co-evaluation               AM-PAC PT "6 Clicks" Mobility  Outcome Measure Help needed turning from your back to your side while in a flat bed without using bedrails?: None Help needed moving from lying on your back to sitting on the side of a flat bed without using  bedrails?: None Help needed moving to and from a bed to a chair (including a wheelchair)?: A Little Help needed standing up from a chair using your arms (e.g., wheelchair or bedside chair)?: A Little Help needed to walk in hospital room?: A Little Help needed climbing 3-5 steps with a railing? : A Little 6 Click Score: 20    End of Session Equipment Utilized During Treatment: Gait belt;Oxygen Activity Tolerance: Patient tolerated treatment well Patient left: in chair;with call bell/phone within reach;with chair alarm set Nurse Communication: Mobility status PT Visit Diagnosis: Unsteadiness on feet (R26.81);History of falling (Z91.81)    Time: 8938-1017 PT Time Calculation (min) (ACUTE ONLY): 26 min   Charges:   PT Evaluation $PT Eval Low Complexity: 1 Low PT Treatments $Therapeutic Activity: 8-22 mins       Andrey Campanile, SPT  Andrey Campanile 04/26/2021, 4:12 PM

## 2021-04-27 DIAGNOSIS — D649 Anemia, unspecified: Secondary | ICD-10-CM | POA: Diagnosis not present

## 2021-04-27 DIAGNOSIS — J189 Pneumonia, unspecified organism: Secondary | ICD-10-CM | POA: Diagnosis not present

## 2021-04-27 DIAGNOSIS — A09 Infectious gastroenteritis and colitis, unspecified: Secondary | ICD-10-CM | POA: Diagnosis not present

## 2021-04-27 LAB — URINALYSIS, ROUTINE W REFLEX MICROSCOPIC
Bilirubin Urine: NEGATIVE
Glucose, UA: NEGATIVE mg/dL
Hgb urine dipstick: NEGATIVE
Ketones, ur: NEGATIVE mg/dL
Leukocytes,Ua: NEGATIVE
Nitrite: NEGATIVE
Protein, ur: NEGATIVE mg/dL
Specific Gravity, Urine: 1.012 (ref 1.005–1.030)
pH: 5 (ref 5.0–8.0)

## 2021-04-27 LAB — BASIC METABOLIC PANEL
Anion gap: 11 (ref 5–15)
BUN: 32 mg/dL — ABNORMAL HIGH (ref 8–23)
CO2: 24 mmol/L (ref 22–32)
Calcium: 8.7 mg/dL — ABNORMAL LOW (ref 8.9–10.3)
Chloride: 104 mmol/L (ref 98–111)
Creatinine, Ser: 1.17 mg/dL (ref 0.61–1.24)
GFR, Estimated: >60 mL/min (ref >60)
Glucose, Bld: 120 mg/dL — ABNORMAL HIGH (ref 70–99)
Potassium: 4.3 mmol/L (ref 3.5–5.1)
Sodium: 139 mmol/L (ref 135–145)

## 2021-04-27 LAB — CBC
HCT: 25.5 % — ABNORMAL LOW (ref 39.0–52.0)
Hemoglobin: 8.3 g/dL — ABNORMAL LOW (ref 13.0–17.0)
MCH: 29.3 pg (ref 26.0–34.0)
MCHC: 32.5 g/dL (ref 30.0–36.0)
MCV: 90.1 fL (ref 80.0–100.0)
Platelets: 242 10*3/uL (ref 150–400)
RBC: 2.83 MIL/uL — ABNORMAL LOW (ref 4.22–5.81)
RDW: 18.9 % — ABNORMAL HIGH (ref 11.5–15.5)
WBC: 6.1 10*3/uL (ref 4.0–10.5)
nRBC: 0 % (ref 0.0–0.2)

## 2021-04-27 MED ORDER — POLYETHYLENE GLYCOL 3350 17 G PO PACK
17.0000 g | PACK | Freq: Every day | ORAL | Status: DC
  Administered 2021-04-27: 17 g via ORAL
  Filled 2021-04-27: qty 1

## 2021-04-27 MED ORDER — DOCUSATE SODIUM 100 MG PO CAPS
200.0000 mg | ORAL_CAPSULE | Freq: Two times a day (BID) | ORAL | Status: DC
  Administered 2021-04-27 – 2021-04-28 (×3): 200 mg via ORAL
  Filled 2021-04-27 (×3): qty 2

## 2021-04-27 NOTE — TOC Progression Note (Signed)
Transition of Care [facility]) - Progression Note    Patient Details  Name: Jimmy West MRN: [id number] Date of Birth: July 04, 1951  [facility] CM/SW Contact  Zigmund Daniel Dorian Pod, RN Phone Number: 04/27/2021, 12:02 PM  Clinical Narrative:    Spoke with pt today concerning [place]. Pt open to any agency for choice. Call several agencies and [facility]) has accepted pt for [facility] PT[facility]RN. No DME recommended. Pt has sufficient transportation for pick up upon his discharge and supportive neighbor when needed.  Pt not medical stable for d/c today, per attending.  TOC RN will remains available for ongoing discharge needs.   Expected Discharge Plan: Home/Self Care Barriers to Discharge: Continued Medical Work up  Expected Discharge Plan and Services Expected Discharge Plan: Home/Self Care       Living arrangements for the past 2 months: Mobile Home                 DME Arranged: N/A DME Agency: NA       HH Arranged: NA           Social Determinants of Health (SDOH) Interventions    Readmission Risk Interventions Readmission Risk Prevention Plan 04/26/2021  Transportation Screening Complete  PCP or Specialist Appt within 3-5 Days Complete  HRI or [place]rest Complete  Social Work Consult for [place] Planning/Counseling Complete  Palliative Care Screening Not Applicable  Medication Review Press photographer) Complete  Some recent data might be hidden

## 2021-04-27 NOTE — Progress Notes (Signed)
PROGRESS NOTE    Jimmy West  HYW:[id number] DOB: 07-28-1950 DOA: 04/24/2021 PCP: Pcp, No    Assessment & Plan:   Principal Problem:   Acute anemia Active Problems:   Primary cancer of right lower lobe of lung (HCC)   Cancer-related pain   Chemotherapy induced nausea and vomiting   Anxiety in cancer patient   Depression   Chemotherapy-induced neutropenia (HCC)   Pleuritic pain   Chronic anticoagulation   Acute hypokalemia   NSTEMI (non-ST elevated myocardial infarction) ([place])   Diarrhea   Pneumonia  Right lower lobe pneumonia: possibly aspiration. Continue on IV unasyn, IV steroids, bronchodilators & encourage incentive spirometry. Procal 0.54. Desaturates in 45s w/ exertion, will repeat oxygen desat test today    Mild hypotension: secondary to anemia. MAP >65. Resolved    Acute on chronic normocytic anemia: likely secondary to recent chemo. S/p 3 units of pRBCs transfused. Will continue to monitor H&H    Chronic pain: likely secondary to metastatic carcinoma. Continue on oxycodone, morphine   Elevated troponins: likely secondary to demand ischemia not NSTEMI. Trending down    Diarrhea: secondary to EPEC. C. diff was neg. Continue on IV azithromycin   Hypokalemia: WNL   Stage IV lung adenocarcinoma: outpatient f/u w/ onco    Depression: severity unknown. Continue on home dose of fluoxetine, olanzapine  Anxiety: severity unknown. Continue on home dose of xanax      DVT prophylaxis: SCDs Code Status: DNR Family Communication:  Disposition Plan: depends on PT/OT recs   Level of care: Med-Surg  Status is: Observation  The patient will require care spanning > 2 midnights and should be moved to inpatient because: Unsafe d/c plan, IV treatments appropriate due to intensity of illness or inability to take PO, and Inpatient level of care appropriate due to severity of illness  Dispo: The patient is from: Home              Anticipated d/c is to: Home vs  SNF              Patient currently is not medically stable to d/c.   Difficult to place patient : unclear    Consultants:    Procedures:   Antimicrobials: unasyn, azithromycin    Subjective: Pt c/o difficulty breathing w/ exertion    Objective: Vitals:   04/26/21 1620 04/26/21 1957 04/26/21 2349 04/27/21 0541  BP: 114/79 116/76 103/67 112/64  Pulse: 79 95 (!) 56 79  Resp: 20 16 16 18   Temp: 97.7 F (36.5 C) 98 F (36.7 C) 97.7 F (36.5 C) (!) 97.5 F (36.4 C)  TempSrc: Oral Oral Oral Oral  SpO2: 95% 90% 93% 91%  Weight:      Height:        Intake/Output Summary (Last 24 hours) at 04/27/2021 0722 Last data filed at 04/27/2021 4854 Gross per 24 hour  Intake 1060 ml  Output 1150 ml  Net -90 ml   Filed Weights   04/24/21 1014  Weight: 59.4 kg    Examination:  General exam: Appears comfortable  Respiratory system: diminished breath sounds b/l  Cardiovascular system: S1 & S2+. No rubs or clicks  Gastrointestinal system: Abd is soft, NT, ND & normal bowel sounds  Central nervous system: Alert and oriented. Moves all extremities  Psychiatry: Judgement and insight appear normal. Flat mood and affect     Data Reviewed: I have personally reviewed following labs and imaging studies  CBC: Recent Labs  Lab 04/24/21 1042 04/24/21 2120  04/25/21 0513 04/26/21 0610 04/27/21 0452  WBC 5.9  --  5.7 6.5 6.1  NEUTROABS 5.0  --   --   --   --   HGB 5.4* 7.4* 9.1* 8.8* 8.3*  HCT 17.0* 22.5* 27.7* 25.9* 25.5*  MCV 91.4  --  87.9 89.6 90.1  PLT 229  --  229 250 086   Basic Metabolic Panel: Recent Labs  Lab 04/24/21 1042 04/25/21 0513 04/26/21 0610 04/27/21 0452  NA 140 139 139 139  K 3.1* 4.5 4.3 4.3  CL 107 106 108 104  CO2 24 25 25 24   GLUCOSE 117* 162* 120* 120*  BUN 23 19 24* 32*  CREATININE 1.36* 1.11 1.24 1.17  CALCIUM 8.2* 8.6* 8.9 8.7*  MG 1.8 1.9  --   --    GFR: Estimated Creatinine Clearance: 49.4 mL/min (by C-G formula based on SCr of 1.17  mg/dL). Liver Function Tests: Recent Labs  Lab 04/24/21 1042  AST 10*  ALT 9  ALKPHOS 63  BILITOT 1.1  PROT 5.8*  ALBUMIN 2.6*   No results for input(s): LIPASE, AMYLASE in the last 168 hours. No results for input(s): AMMONIA in the last 168 hours. Coagulation Profile: Recent Labs  Lab 04/24/21 1406  INR 1.4*   Cardiac Enzymes: No results for input(s): CKTOTAL, CKMB, CKMBINDEX, TROPONINI in the last 168 hours. BNP (last 3 results) No results for input(s): PROBNP in the last 8760 hours. HbA1C: No results for input(s): HGBA1C in the last 72 hours. CBG: No results for input(s): GLUCAP in the last 168 hours. Lipid Profile: No results for input(s): CHOL, HDL, LDLCALC, TRIG, CHOLHDL, LDLDIRECT in the last 72 hours. Thyroid Function Tests: Recent Labs    04/24/21 1042 04/24/21 1406  TSH 1.027 1.049  FREET4 0.97  --    Anemia Panel: Recent Labs    04/24/21 1042 04/26/21 1519  VITAMINB12  --  1,308*  FERRITIN 1,871*  --   TIBC 239*  --   IRON 47  --    Sepsis Labs: Recent Labs  Lab 04/24/21 1042 04/25/21 0513 04/26/21 0610  PROCALCITON 0.17 0.54 0.28    Recent Results (from the past 240 hour(s))  Resp Panel by RT-PCR (Flu A&B, Covid) Nasopharyngeal Swab     Status: None   Collection Time: 04/24/21 10:42 AM   Specimen: Nasopharyngeal Swab; Nasopharyngeal(NP) swabs in vial transport medium  Result Value Ref Range Status   SARS Coronavirus 2 by RT PCR NEGATIVE NEGATIVE Final    Comment: (NOTE) SARS-CoV-2 target nucleic acids are NOT DETECTED.  The SARS-CoV-2 RNA is generally detectable in upper respiratory specimens during the acute phase of infection. The lowest concentration of SARS-CoV-2 viral copies this assay can detect is 138 copies/mL. A negative result does not preclude SARS-Cov-2 infection and should not be used as the sole basis for treatment or other patient management decisions. A negative result may occur with  improper specimen  collection/handling, submission of specimen other than nasopharyngeal swab, presence of viral mutation(s) within the areas targeted by this assay, and inadequate number of viral copies(<138 copies/mL). A negative result must be combined with clinical observations, patient history, and epidemiological information. The expected result is Negative.  Fact Sheet for Patients:  EntrepreneurPulse.com.au  Fact Sheet for Healthcare Providers:  IncredibleEmployment.be  This test is no t yet approved or cleared by the [place] FDA and  has been authorized for detection and/or diagnosis of SARS-CoV-2 by FDA under an Emergency Use Authorization (EUA). This EUA will remain  in effect (meaning this test can be used) for the duration of the COVID-19 declaration under Section 564(b)(1) of the Act, 21 U.S.C.section 360bbb-3(b)(1), unless the authorization is terminated  or revoked sooner.       Influenza A by PCR NEGATIVE NEGATIVE Final   Influenza B by PCR NEGATIVE NEGATIVE Final    Comment: (NOTE) The Xpert Xpress SARS-CoV-2/FLU/RSV plus assay is intended as an aid in the diagnosis of influenza from Nasopharyngeal swab specimens and should not be used as a sole basis for treatment. Nasal washings and aspirates are unacceptable for Xpert Xpress SARS-CoV-2/FLU/RSV testing.  Fact Sheet for Patients: EntrepreneurPulse.com.au  Fact Sheet for Healthcare Providers: IncredibleEmployment.be  This test is not yet approved or cleared by the [place] FDA and has been authorized for detection and/or diagnosis of SARS-CoV-2 by FDA under an Emergency Use Authorization (EUA). This EUA will remain in effect (meaning this test can be used) for the duration of the COVID-19 declaration under Section 564(b)(1) of the Act, 21 U.S.C. section 360bbb-3(b)(1), unless the authorization is terminated or revoked.  Performed at [facility], [place], [place] [address]   C Difficile Quick Screen w PCR reflex     Status: None   Collection Time: 04/25/21 11:40 AM   Specimen: STOOL  Result Value Ref Range Status   C Diff antigen NEGATIVE NEGATIVE Final   C Diff toxin NEGATIVE NEGATIVE Final   C Diff interpretation No C. difficile detected.  Final    Comment: Performed at [facility], [address] [place], [place] [address]  Gastrointestinal Panel by PCR , Stool     Status: Abnormal   Collection Time: 04/25/21 11:40 AM   Specimen: Stool  Result Value Ref Range Status   Campylobacter species NOT DETECTED NOT DETECTED Final   Plesimonas shigelloides NOT DETECTED NOT DETECTED Final   Salmonella species NOT DETECTED NOT DETECTED Final   Yersinia enterocolitica NOT DETECTED NOT DETECTED Final   Vibrio species NOT DETECTED NOT DETECTED Final   Vibrio cholerae NOT DETECTED NOT DETECTED Final   Enteroaggregative E coli (EAEC) NOT DETECTED NOT DETECTED Final   Enteropathogenic E coli (EPEC) DETECTED (A) NOT DETECTED Final    Comment: RESULT CALLED TO, READ BACK BY AND VERIFIED WITH: MATHEW WRIGHT @1340  04/25/21 MJU    Enterotoxigenic E coli (ETEC) NOT DETECTED NOT DETECTED Final   Shiga like toxin producing E coli (STEC) NOT DETECTED NOT DETECTED Final   Shigella/Enteroinvasive E coli (EIEC) NOT DETECTED NOT DETECTED Final   Cryptosporidium NOT DETECTED NOT DETECTED Final   Cyclospora cayetanensis NOT DETECTED NOT DETECTED Final   Entamoeba histolytica NOT DETECTED NOT DETECTED Final   Giardia lamblia NOT DETECTED NOT DETECTED Final   Adenovirus F40/41 NOT DETECTED NOT DETECTED Final   Astrovirus NOT DETECTED NOT DETECTED Final   Norovirus GI/GII NOT DETECTED NOT DETECTED Final   Rotavirus A NOT DETECTED NOT DETECTED Final   Sapovirus (I, II, IV, and V) NOT DETECTED NOT DETECTED Final    Comment: Performed at [facility], [address], [place], [place] [address]          Radiology Studies: No results found.      Scheduled Meds:  Chlorhexidine Gluconate Cloth  6 each Topical Daily   FLUoxetine  40 mg Oral Daily   methylPREDNISolone (SOLU-MEDROL) injection  40 mg Intravenous Q12H   OLANZapine  10 mg Oral QHS   Continuous Infusions:  ampicillin-sulbactam (UNASYN) IV 1.5 g (04/27/21 0457)  azithromycin 250 mL/hr at 04/26/21 1517     LOS: 2 days    Time spent: 25 mins     Wyvonnia Dusky, MD [facility] Pager [phone]  If 7PM-7AM, please contact night-coverage 04/27/2021, 7:22 AM

## 2021-04-27 NOTE — Progress Notes (Signed)
O2 Qualification Results:  O2 sats at Rest- RA 90  02 sats with ambulation- RA 81-87% O2 applied at 2L with saturations increased to 94%.

## 2021-04-28 DIAGNOSIS — C3431 Malignant neoplasm of lower lobe, right bronchus or lung: Secondary | ICD-10-CM | POA: Diagnosis not present

## 2021-04-28 DIAGNOSIS — C3490 Malignant neoplasm of unspecified part of unspecified bronchus or lung: Secondary | ICD-10-CM

## 2021-04-28 DIAGNOSIS — D649 Anemia, unspecified: Secondary | ICD-10-CM | POA: Diagnosis not present

## 2021-04-28 LAB — CBC
HCT: 28.8 % — ABNORMAL LOW (ref 39.0–52.0)
Hemoglobin: 9.7 g/dL — ABNORMAL LOW (ref 13.0–17.0)
MCH: 30.3 pg (ref 26.0–34.0)
MCHC: 33.7 g/dL (ref 30.0–36.0)
MCV: 90 fL (ref 80.0–100.0)
Platelets: 326 10*3/uL (ref 150–400)
RBC: 3.2 MIL/uL — ABNORMAL LOW (ref 4.22–5.81)
RDW: 18.6 % — ABNORMAL HIGH (ref 11.5–15.5)
WBC: 8.4 10*3/uL (ref 4.0–10.5)
nRBC: 0 % (ref 0.0–0.2)

## 2021-04-28 LAB — BASIC METABOLIC PANEL
Anion gap: 6 (ref 5–15)
BUN: 31 mg/dL — ABNORMAL HIGH (ref 8–23)
CO2: 26 mmol/L (ref 22–32)
Calcium: 9.1 mg/dL (ref 8.9–10.3)
Chloride: 109 mmol/L (ref 98–111)
Creatinine, Ser: 1.3 mg/dL — ABNORMAL HIGH (ref 0.61–1.24)
GFR, Estimated: 59 mL/min — ABNORMAL LOW (ref >60)
Glucose, Bld: 114 mg/dL — ABNORMAL HIGH (ref 70–99)
Potassium: 4.2 mmol/L (ref 3.5–5.1)
Sodium: 141 mmol/L (ref 135–145)

## 2021-04-28 MED ORDER — ROPINIROLE HCL 1 MG PO TABS
1.0000 mg | ORAL_TABLET | Freq: Every day | ORAL | Status: DC
  Administered 2021-04-28: 01:00:00 1 mg via ORAL
  Filled 2021-04-28: qty 1

## 2021-04-28 MED ORDER — HEPARIN SOD (PORK) LOCK FLUSH 10 UNIT/ML IV SOLN
5.0000 [IU] | Freq: Once | INTRAVENOUS | Status: DC
  Filled 2021-04-28: qty 1

## 2021-04-28 MED ORDER — HEPARIN SOD (PORK) LOCK FLUSH 100 UNIT/ML IV SOLN
INTRAVENOUS | Status: AC
  Filled 2021-04-28: qty 5

## 2021-04-28 MED ORDER — TRAZODONE HCL 50 MG PO TABS
25.0000 mg | ORAL_TABLET | Freq: Every evening | ORAL | Status: DC | PRN
  Administered 2021-04-28: 01:00:00 25 mg via ORAL
  Filled 2021-04-28: qty 1

## 2021-04-28 MED ORDER — PREDNISONE 20 MG PO TABS: 40.0000 mg | ORAL_TABLET | Freq: Every day | ORAL | 0 refills | Status: AC

## 2021-04-28 MED ORDER — METHYLPREDNISOLONE SODIUM SUCC 40 MG IJ SOLR
40.0000 mg | Freq: Every day | INTRAMUSCULAR | Status: DC
  Administered 2021-04-28: 40 mg via INTRAVENOUS
  Filled 2021-04-28: qty 1

## 2021-04-28 MED ORDER — AMOXICILLIN-POT CLAVULANATE 875-125 MG PO TABS: 1.0000 | ORAL_TABLET | Freq: Two times a day (BID) | ORAL | 0 refills | Status: AC

## 2021-04-28 NOTE — Progress Notes (Signed)
Pt port deaccessed using heparin flush. VSS> Education completed with pt.  All belongings sent with pt. No questions at this time.

## 2021-04-28 NOTE — Progress Notes (Signed)
SATURATION QUALIFICATIONS: (This note is used to comply with regulatory documentation for home oxygen)  Patient Saturations on Room Air at Rest = 92%  Patient Saturations on Room Air while Ambulating = 85%  Patient Saturations on 2 Liters of oxygen while Ambulating = 93%  Please briefly explain why patient needs home oxygen:

## 2021-04-28 NOTE — Discharge Summary (Signed)
Physician Discharge Summary  Jimmy West Main OEU:[id number] DOB: 1950-10-19 DOA: 04/24/2021  PCP: Pcp, No  Admit date: 04/24/2021 Discharge date: 04/28/2021  Admitted From: home Disposition:  home w/ home health   Recommendations for Outpatient Follow-up:  Follow up with PCP in 1-2 weeks F/u w/ onco w/in 1 week   Home Health: yes  Equipment/Devices: 2L [place]   Discharge Condition: stable  CODE STATUS: DNR Diet recommendation: Regular   Brief/Interim Summary: HPI was taken from Dr. Tobie Poet: Jimmy West is a 70 y.o. 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   At bedside, Mr. List is alert and oriented to self, age, current calendar year, and location.    He presents to the ED for shortness of breath that has been on/off for about two weeks.  Reports the shortness of breath is worse with exertion.  He endorses lack of energy.  Last evening, he states he felt that he had no strength to even get up to go the bathroom, this prompted him to call EMS for ED presentation. He reports wheezing in his throat.    He denies cough, fever, vomiting, loss of consciousness, syncope. He endorses nausea infrequently and states this has been his baseline since he started chemotherapy. He denies known sick contacts. He endorses loose/watery bowel for the last two weeks.    He endorses getting meals on wheels, however he has been feeling so weak that he has had poor p.o. intake for the last 2 days.  Patient he endorses increased loose stools and watery stools in the last 3 days.   He endorses significant weight loss in the last 6 months though he does not know the exact weight     Social history: He lives by himself. He is a former tobacco user, quitting in 2005. At his peak, he smoked about 2 ppds. He is a former etoh user and last drink was too long to remember. He formerly smoked THC. He formerly worked as a Probation officer.    Vaccination history: He is vaccinated for covid 19, two doses and one booster. He does not know the brand.  Hospital course from Dr. Jimmye Norman 10/6-10/9/22: Pt was found to have right lower lobe pneumonia possibly secondary to aspiration. Pt received IV unasyn inpatient and was switched to po augmentin at d/c. Pt was also d/c w/ short course of prednisone to complete the course at home.  Speech evaluated the pt and recommended dysphagia III diet. Of note, pt desaturation into the mid 80s w/ exertion/walking so pt was d/c home w 2L [place]. Also, pt's diarrhea was positive for EPEC and pt completed the course of abxs while inpatient. For more information, please see previous progress/consult notes.  Discharge Diagnoses:  Principal Problem:   Acute anemia Active Problems:   Primary cancer of right lower lobe of lung (HCC)   Cancer-related pain   Chemotherapy induced nausea and vomiting   Anxiety in cancer patient   Depression   Chemotherapy-induced neutropenia (HCC)   Pleuritic pain   Chronic anticoagulation   Acute hypokalemia   NSTEMI (non-ST elevated myocardial infarction) ([place])   Diarrhea   Pneumonia  Right lower lobe pneumonia: possibly aspiration. Continue on augmentin, steroids, bronchodilators & encourage  incentive spirometry. Procal 0.54. Continues to desaturate into 80s w/ exertion/walking, d/c home w/ 2 L [place]    Mild hypotension: secondary to anemia. MAP >65. Resolved    Acute on chronic normocytic anemia: likely secondary to recent chemo. S/p 3 units of pRBCs transfused. Can restart home dose of eliquis at d/c   Chronic pain: likely secondary to metastatic carcinoma. Continue on oxycodone, morphine   Elevated troponins: likely  secondary to demand ischemia not NSTEMI. Trending down    Diarrhea: secondary to EPEC. C. diff was neg. Completed abx course  Hypokalemia: WNL   Stage IV lung adenocarcinoma: outpatient f/u w/ onco    Depression: severity unknown. Continue on home dose of fluoxetine, olanzapine  Anxiety: severity unknown. Continue on home dose of xanax    Discharge Instructions  Discharge Instructions     Diet general   Complete by: As directed    Dysphagia III diet: Add fine chopped meats. Wants a Boost supplement. Small single sips   Discharge instructions   Complete by: As directed    F/u w/ PCP in 1-2 weeks. F/u w/ onco w/in 1 week   Increase activity slowly   Complete by: As directed       Allergies as of 04/28/2021   No Known Allergies      Medication List     TAKE these medications    albuterol 108 (90 Base) MCG/ACT inhaler Commonly known as: VENTOLIN HFA Inhale 2 puffs into the lungs every 6 (six) hours as needed for wheezing or shortness of breath.   ALPRAZolam 1 MG tablet Commonly known as: Xanax Take 1 tablet (1 mg total) by mouth 3 (three) times daily as needed for anxiety.   amoxicillin-clavulanate 875-125 MG tablet Commonly known as: Augmentin Take 1 tablet by mouth 2 (two) times daily for 3 days.   dexamethasone 4 MG tablet Commonly known as: DECADRON TAKE 1 TABLET BY MOUTH TWICE A DAY BEFORE ALIMTA CHEMO, THEN TAKE 2 TABS ONCE DAILY X 3 DAYS STARTING DAY AFTER CHEMO   Eliquis 5 MG Tabs tablet Generic drug: apixaban Take 1 tablet (5 mg total) by mouth 2 (two) times daily.   FLUoxetine 40 MG capsule Commonly known as: PROzac Take 1 capsule (40 mg total) by mouth daily.   folic acid 1 MG tablet Commonly known as: FOLVITE Take 1 tablet (1 mg total) by mouth daily. Continue until 21 days after Alimta completed.   OLANZapine 10 MG tablet Commonly known as: ZYPREXA Take 1 tablet (10 mg total) by mouth at bedtime.   Oxycodone HCl 20 MG Tabs Take 1-1.5  tablets (20-30 mg total) by mouth every 4 (four) hours as needed.   polyethylene glycol 17 g packet Commonly known as: MIRALAX / GLYCOLAX Take 17 g by mouth daily.   predniSONE 20 MG tablet Commonly known as: DELTASONE Take 2 tablets (40 mg total) by mouth daily for 3 days.   prochlorperazine 10 MG tablet Commonly known as: COMPAZINE Take 1 tablet (10 mg total) by mouth every 6 (six) hours as needed for nausea or vomiting.   senna 8.6 MG tablet Commonly known as: SENOKOT Take 1 tablet by mouth as needed.               Durable Medical Equipment  (From admission, onward)           Start     Ordered   04/28/21 0930  For home use only DME oxygen  Once  Question Answer Comment  Length of Need 12 Months   Mode or (Route) Nasal cannula   Liters per Minute 2   Frequency Continuous (stationary and portable oxygen unit needed)   Oxygen conserving device Yes   Oxygen delivery system Gas      04/28/21 0929            No Known Allergies  Consultations:    Procedures/Studies: DG Chest 2 View  Result Date: 04/24/2021 CLINICAL DATA:  Shortness of breath. History of stage IV lung cancer. EXAM: CHEST - 2 VIEW COMPARISON:  Chest radiographs 09/18/2020 and CT 11/01/2020 FINDINGS: A right jugular Port-A-Cath remains in place terminating over the right atrium. The cardiac silhouette is within normal limits in size. There is volume loss in the hemithorax with increasing confluent and patchy opacity in the right perihilar region and right lung base which partially obscures the known superior segment right lower lobe mass. There may be a trace right pleural effusion. The interstitial markings are mildly prominent throughout the left lung without focal consolidation. No pneumothorax is identified. No acute osseous abnormality is seen. IMPRESSION: Increasing right lower lung consolidation and volume loss. Electronically Signed   By: Logan Bores M.D.   On: 04/24/2021 11:36    (Echo, Carotid, EGD, Colonoscopy, ERCP)    Subjective: Pt c/o fatigue    Discharge Exam: Vitals:   04/28/21 0635 04/28/21 0745  BP: 124/75 108/72  Pulse: 86 79  Resp: 16 16  Temp: 97.7 F (36.5 C) 98 F (36.7 C)  SpO2: 91% 96%   Vitals:   04/27/21 2134 04/27/21 2342 04/28/21 0635 04/28/21 0745  BP: 108/67 111/72 124/75 108/72  Pulse: (!) 57 (!) 58 86 79  Resp: 16 20 16 16   Temp: (!) 97.5 F (36.4 C) (!) 97.3 F (36.3 C) 97.7 F (36.5 C) 98 F (36.7 C)  TempSrc: Oral Oral Oral   SpO2: 93% 92% 91% 96%  Weight:      Height:        General: Pt is alert, awake, not in acute distress Cardiovascular: S1/S2 +, no rubs, no gallops Respiratory: diminished breath sounds b/l  Abdominal: Soft, NT, ND, bowel sounds + Extremities: no edema, no cyanosis    The results of significant diagnostics from this hospitalization (including imaging, microbiology, ancillary and laboratory) are listed below for reference.     Microbiology: Recent Results (from the past 240 hour(s))  Resp Panel by RT-PCR (Flu A&B, Covid) Nasopharyngeal Swab     Status: None   Collection Time: 04/24/21 10:42 AM   Specimen: Nasopharyngeal Swab; Nasopharyngeal(NP) swabs in vial transport medium  Result Value Ref Range Status   SARS Coronavirus 2 by RT PCR NEGATIVE NEGATIVE Final    Comment: (NOTE) SARS-CoV-2 target nucleic acids are NOT DETECTED.  The SARS-CoV-2 RNA is generally detectable in upper respiratory specimens during the acute phase of infection. The lowest concentration of SARS-CoV-2 viral copies this assay can detect is 138 copies/mL. A negative result does not preclude SARS-Cov-2 infection and should not be used as the sole basis for treatment or other patient management decisions. A negative result may occur with  improper specimen collection/handling, submission of specimen other than nasopharyngeal swab, presence of viral mutation(s) within the areas targeted by this assay, and  inadequate number of viral copies(<138 copies/mL). A negative result must be combined with clinical observations, patient history, and epidemiological information. The expected result is Negative.  Fact Sheet for Patients:  EntrepreneurPulse.com.au  Fact Sheet for Healthcare Providers:  IncredibleEmployment.be  This test is no t yet approved or cleared by the [place] and  has been authorized for detection and/or diagnosis of SARS-CoV-2 by FDA under an Emergency Use Authorization (EUA). This EUA will remain  in effect (meaning this test can be used) for the duration of the COVID-19 declaration under Section 564(b)(1) of the Act, 21 U.S.C.section 360bbb-3(b)(1), unless the authorization is terminated  or revoked sooner.       Influenza A by PCR NEGATIVE NEGATIVE Final   Influenza B by PCR NEGATIVE NEGATIVE Final    Comment: (NOTE) The Xpert Xpress SARS-CoV-2/FLU/RSV plus assay is intended as an aid in the diagnosis of influenza from Nasopharyngeal swab specimens and should not be used as a sole basis for treatment. Nasal washings and aspirates are unacceptable for Xpert Xpress SARS-CoV-2/FLU/RSV testing.  Fact Sheet for Patients: EntrepreneurPulse.com.au  Fact Sheet for Healthcare Providers: IncredibleEmployment.be  This test is not yet approved or cleared by the [place] FDA and has been authorized for detection and/or diagnosis of SARS-CoV-2 by FDA under an Emergency Use Authorization (EUA). This EUA will remain in effect (meaning this test can be used) for the duration of the COVID-19 declaration under Section 564(b)(1) of the Act, 21 U.S.C. section 360bbb-3(b)(1), unless the authorization is terminated or revoked.  Performed at [facility], [place], [place] [address]   C Difficile Quick Screen w PCR reflex     Status: None   Collection Time: 04/25/21 11:40  AM   Specimen: STOOL  Result Value Ref Range Status   C Diff antigen NEGATIVE NEGATIVE Final   C Diff toxin NEGATIVE NEGATIVE Final   C Diff interpretation No C. difficile detected.  Final    Comment: Performed at [facility], [address] [place], [place] [address]  Gastrointestinal Panel by PCR , Stool     Status: Abnormal   Collection Time: 04/25/21 11:40 AM   Specimen: Stool  Result Value Ref Range Status   Campylobacter species NOT DETECTED NOT DETECTED Final   Plesimonas shigelloides NOT DETECTED NOT DETECTED Final   Salmonella species NOT DETECTED NOT DETECTED Final   Yersinia enterocolitica NOT DETECTED NOT DETECTED Final   Vibrio species NOT DETECTED NOT DETECTED Final   Vibrio cholerae NOT DETECTED NOT DETECTED Final   Enteroaggregative E coli (EAEC) NOT DETECTED NOT DETECTED Final   Enteropathogenic E coli (EPEC) DETECTED (A) NOT DETECTED Final    Comment: RESULT CALLED TO, READ BACK BY AND VERIFIED WITH: MATHEW WRIGHT @1340  04/25/21 MJU    Enterotoxigenic E coli (ETEC) NOT DETECTED NOT DETECTED Final   Shiga like toxin producing E coli (STEC) NOT DETECTED NOT DETECTED Final   Shigella/Enteroinvasive E coli (EIEC) NOT DETECTED NOT DETECTED Final   Cryptosporidium NOT DETECTED NOT DETECTED Final   Cyclospora cayetanensis NOT DETECTED NOT DETECTED Final   Entamoeba histolytica NOT DETECTED NOT DETECTED Final   Giardia lamblia NOT DETECTED NOT DETECTED Final   Adenovirus F40/41 NOT DETECTED NOT DETECTED Final   Astrovirus NOT DETECTED NOT DETECTED Final   Norovirus GI/GII NOT DETECTED NOT DETECTED Final   Rotavirus A NOT DETECTED NOT DETECTED Final   Sapovirus (I, II, IV, and V) NOT DETECTED NOT DETECTED Final    Comment: Performed at [facility], [address] [place],  [address]     Labs: BNP (last 3 results) Recent Labs    04/24/21 1042  BNP 283.6*   Basic Metabolic Panel: Recent Labs  Lab  04/24/21 1042 04/25/21 0513  04/26/21 0610 04/27/21 0452 04/28/21 0541  NA 140 139 139 139 141  K 3.1* 4.5 4.3 4.3 4.2  CL 107 106 108 104 109  CO2 24 25 25 24 26   GLUCOSE 117* 162* 120* 120* 114*  BUN 23 19 24* 32* 31*  CREATININE 1.36* 1.11 1.24 1.17 1.30*  CALCIUM 8.2* 8.6* 8.9 8.7* 9.1  MG 1.8 1.9  --   --   --    Liver Function Tests: Recent Labs  Lab 04/24/21 1042  AST 10*  ALT 9  ALKPHOS 63  BILITOT 1.1  PROT 5.8*  ALBUMIN 2.6*   No results for input(s): LIPASE, AMYLASE in the last 168 hours. No results for input(s): AMMONIA in the last 168 hours. CBC: Recent Labs  Lab 04/24/21 1042 04/24/21 2120 04/25/21 0513 04/26/21 0610 04/27/21 0452 04/28/21 0541  WBC 5.9  --  5.7 6.5 6.1 8.4  NEUTROABS 5.0  --   --   --   --   --   HGB 5.4* 7.4* 9.1* 8.8* 8.3* 9.7*  HCT 17.0* 22.5* 27.7* 25.9* 25.5* 28.8*  MCV 91.4  --  87.9 89.6 90.1 90.0  PLT 229  --  229 250 242 326   Cardiac Enzymes: No results for input(s): CKTOTAL, CKMB, CKMBINDEX, TROPONINI in the last 168 hours. BNP: Invalid input(s): POCBNP CBG: No results for input(s): GLUCAP in the last 168 hours. D-Dimer No results for input(s): DDIMER in the last 72 hours. Hgb A1c No results for input(s): HGBA1C in the last 72 hours. Lipid Profile No results for input(s): CHOL, HDL, LDLCALC, TRIG, CHOLHDL, LDLDIRECT in the last 72 hours. Thyroid function studies No results for input(s): TSH, T4TOTAL, T3FREE, THYROIDAB in the last 72 hours.  Invalid input(s): FREET3 Anemia work up Recent Labs    04/26/21 1519  VITAMINB12 1,308*   Urinalysis    Component Value Date/Time   COLORURINE STRAW (A) 04/27/2021 0005   APPEARANCEUR CLEAR (A) 04/27/2021 0005   LABSPEC 1.012 04/27/2021 0005   PHURINE 5.0 04/27/2021 0005   GLUCOSEU NEGATIVE 04/27/2021 0005   HGBUR NEGATIVE 04/27/2021 0005   BILIRUBINUR NEGATIVE 04/27/2021 0005   KETONESUR NEGATIVE 04/27/2021 0005   PROTEINUR NEGATIVE 04/27/2021 0005   NITRITE NEGATIVE 04/27/2021 0005    LEUKOCYTESUR NEGATIVE 04/27/2021 0005   Sepsis Labs Invalid input(s): PROCALCITONIN,  WBC,  LACTICIDVEN Microbiology Recent Results (from the past 240 hour(s))  Resp Panel by RT-PCR (Flu A&B, Covid) Nasopharyngeal Swab     Status: None   Collection Time: 04/24/21 10:42 AM   Specimen: Nasopharyngeal Swab; Nasopharyngeal(NP) swabs in vial transport medium  Result Value Ref Range Status   SARS Coronavirus 2 by RT PCR NEGATIVE NEGATIVE Final    Comment: (NOTE) SARS-CoV-2 target nucleic acids are NOT DETECTED.  The SARS-CoV-2 RNA is generally detectable in upper respiratory specimens during the acute phase of infection. The lowest concentration of SARS-CoV-2 viral copies this assay can detect is 138 copies/mL. A negative result does not preclude SARS-Cov-2 infection and should not be used as the sole basis for treatment or other patient management decisions. A negative result may occur with  improper specimen collection/handling, submission of specimen other than nasopharyngeal swab, presence of viral mutation(s) within the areas targeted by this assay, and inadequate number of viral copies(<138 copies/mL). A negative result must be combined with clinical observations, patient history, and epidemiological information. The expected result is Negative.  Fact Sheet for Patients:  EntrepreneurPulse.com.au  Fact Sheet for Healthcare Providers:  IncredibleEmployment.be  This test is no t yet approved or cleared by the [place] and  has been authorized for detection and/or diagnosis of SARS-CoV-2 by FDA under an Emergency Use Authorization (EUA). This EUA will remain  in effect (meaning this test can be used) for the duration of the COVID-19 declaration under Section 564(b)(1) of the Act, 21 U.S.C.section 360bbb-3(b)(1), unless the authorization is terminated  or revoked sooner.       Influenza A by PCR NEGATIVE NEGATIVE Final   Influenza B  by PCR NEGATIVE NEGATIVE Final    Comment: (NOTE) The Xpert Xpress SARS-CoV-2/FLU/RSV plus assay is intended as an aid in the diagnosis of influenza from Nasopharyngeal swab specimens and should not be used as a sole basis for treatment. Nasal washings and aspirates are unacceptable for Xpert Xpress SARS-CoV-2/FLU/RSV testing.  Fact Sheet for Patients: EntrepreneurPulse.com.au  Fact Sheet for Healthcare Providers: IncredibleEmployment.be  This test is not yet approved or cleared by the [place] FDA and has been authorized for detection and/or diagnosis of SARS-CoV-2 by FDA under an Emergency Use Authorization (EUA). This EUA will remain in effect (meaning this test can be used) for the duration of the COVID-19 declaration under Section 564(b)(1) of the Act, 21 U.S.C. section 360bbb-3(b)(1), unless the authorization is terminated or revoked.  Performed at [facility], [place], [place] [address]   C Difficile Quick Screen w PCR reflex     Status: None   Collection Time: 04/25/21 11:40 AM   Specimen: STOOL  Result Value Ref Range Status   C Diff antigen NEGATIVE NEGATIVE Final   C Diff toxin NEGATIVE NEGATIVE Final   C Diff interpretation No C. difficile detected.  Final    Comment: Performed at [facility], [address] [place], [place] [address]  Gastrointestinal Panel by PCR , Stool     Status: Abnormal   Collection Time: 04/25/21 11:40 AM   Specimen: Stool  Result Value Ref Range Status   Campylobacter species NOT DETECTED NOT DETECTED Final   Plesimonas shigelloides NOT DETECTED NOT DETECTED Final   Salmonella species NOT DETECTED NOT DETECTED Final   Yersinia enterocolitica NOT DETECTED NOT DETECTED Final   Vibrio species NOT DETECTED NOT DETECTED Final   Vibrio cholerae NOT DETECTED NOT DETECTED Final   Enteroaggregative E coli (EAEC) NOT DETECTED NOT DETECTED Final   Enteropathogenic E  coli (EPEC) DETECTED (A) NOT DETECTED Final    Comment: RESULT CALLED TO, READ BACK BY AND VERIFIED WITH: MATHEW WRIGHT @1340  04/25/21 MJU    Enterotoxigenic E coli (ETEC) NOT DETECTED NOT DETECTED Final   Shiga like toxin producing E coli (STEC) NOT DETECTED NOT DETECTED Final   Shigella/Enteroinvasive E coli (EIEC) NOT DETECTED NOT DETECTED Final   Cryptosporidium NOT DETECTED NOT DETECTED Final   Cyclospora cayetanensis NOT DETECTED NOT DETECTED Final   Entamoeba histolytica NOT DETECTED NOT DETECTED Final   Giardia lamblia NOT DETECTED NOT DETECTED Final   Adenovirus F40/41 NOT DETECTED NOT DETECTED Final   Astrovirus NOT DETECTED NOT DETECTED Final   Norovirus GI/GII NOT DETECTED NOT DETECTED Final   Rotavirus A NOT DETECTED NOT DETECTED Final   Sapovirus (I, II, IV, and V) NOT DETECTED NOT DETECTED Final    Comment: Performed at [facility], [address] [place], [place] [address]     Time coordinating discharge: Over 30 minutes  SIGNED:   Wyvonnia Dusky, MD  [facility] 04/28/2021, 10:29 AM Pager  If 7PM-7AM, please contact night-coverage

## 2021-04-28 NOTE — TOC Transition Note (Addendum)
Transition of Care [facility]) - CM/SW Discharge Note   Patient Details  Name: Donielle Radziewicz MRN: [id number] Date of Birth: [birth year]  [facility] CM/SW Contact:  Harriet Masson, RN Phone Number:[phone] 04/28/2021, 9:45 AM   Clinical Narrative:    Pt needs home O2 and receptive to [employer] for DME. Called Jasmine at [employer] with the referral who will deliver portable O2 to the pt's room and set concentrator in the home. Pt aware to await for the portable O2 prior to departing the hospital and verified he has sufficient transportation home today with his friend [name]).  Team update with no other needs presented at this time.  TOC team remains available to assist with any other needs.    Final next level of care: [place] Barriers to Discharge: Barriers Resolved   Patient Goals and CMS Choice Patient states their goals for this hospitalization and ongoing recovery are:: wants to get back home   Choice offered to / list presented to : Patient  Discharge Placement                  Name of family member notified: Shirley Muscat (pt) Patient and family notified of of transfer: 04/28/21  Discharge Plan and Services                DME Arranged: Oxygen DME Agency: [employer] Date DME Agency Contacted: 04/28/21 Time DME Agency Contacted: [phone] Representative spoke with at DME Agency: [place]: RN, PT [place] Agency: [place] Date [place]: 04/27/21 Time [place]: 97 Representative spoke with at [place]: [place] ([place]) Interventions     Readmission Risk Interventions Readmission Risk Prevention Plan 04/26/2021  Transportation Screening Complete  PCP or Specialist Appt within 3-5 Days Complete  HRI or [place] Complete  Social Work Consult for [place] Planning/Counseling Complete  Palliative Care Screening Not Applicable   Medication Review Press photographer) Complete  Some recent data might be hidden

## 2021-04-29 ENCOUNTER — Other Ambulatory Visit: Payer: Self-pay | Admitting: *Deleted

## 2021-04-29 ENCOUNTER — Other Ambulatory Visit: Payer: Medicare HMO

## 2021-04-29 DIAGNOSIS — G893 Neoplasm related pain (acute) (chronic): Secondary | ICD-10-CM

## 2021-04-29 MED ORDER — ALPRAZOLAM 1 MG PO TABS: 1.0000 mg | ORAL_TABLET | Freq: Three times a day (TID) | ORAL | 0 refills | Status: AC | PRN

## 2021-04-29 MED ORDER — OXYCODONE HCL 20 MG PO TABS: 20.0000 mg | ORAL_TABLET | ORAL | 0 refills | Status: DC | PRN

## 2021-04-29 NOTE — Telephone Encounter (Signed)
Pt recently discharged from the hospital. Requesting refill for xanax and oxycodone.

## 2021-04-30 ENCOUNTER — Telehealth: Payer: Self-pay | Admitting: *Deleted

## 2021-04-30 ENCOUNTER — Inpatient Hospital Stay: Payer: Medicare HMO

## 2021-04-30 ENCOUNTER — Inpatient Hospital Stay: Payer: Medicare HMO | Admitting: Internal Medicine

## 2021-04-30 DIAGNOSIS — J189 Pneumonia, unspecified organism: Secondary | ICD-10-CM | POA: Diagnosis not present

## 2021-04-30 DIAGNOSIS — E876 Hypokalemia: Secondary | ICD-10-CM | POA: Diagnosis not present

## 2021-04-30 NOTE — Telephone Encounter (Signed)
Patient was a no show today for treatment. Pt just d/c from the hospital with pneumonia on Sunday. [place]- Dr. B asked that we reschedule his treatment apts out by 2 weeks. Please call patient to r/s.

## 2021-04-30 NOTE — Telephone Encounter (Signed)
Pt aware of new appt and he said thanks for letting him try to get better

## 2021-05-01 ENCOUNTER — Ambulatory Visit: Payer: Medicare HMO

## 2021-05-03 ENCOUNTER — Other Ambulatory Visit: Payer: Self-pay | Admitting: *Deleted

## 2021-05-03 MED ORDER — FLUOXETINE HCL 40 MG PO CAPS: 40.0000 mg | ORAL_CAPSULE | Freq: Every day | ORAL | 1 refills | Status: AC

## 2021-05-07 ENCOUNTER — Other Ambulatory Visit: Payer: Self-pay

## 2021-05-07 ENCOUNTER — Emergency Department: Payer: Medicare HMO

## 2021-05-07 ENCOUNTER — Inpatient Hospital Stay
Admission: EM | Admit: 2021-05-07 | Discharge: 2021-06-13 | DRG: 280 | Disposition: A | Discharge status: Hospice | Payer: Medicare HMO | Attending: Internal Medicine | Admitting: Internal Medicine

## 2021-05-07 DIAGNOSIS — E43 Unspecified severe protein-calorie malnutrition: Secondary | ICD-10-CM | POA: Diagnosis present

## 2021-05-07 DIAGNOSIS — Z79899 Other long term (current) drug therapy: Secondary | ICD-10-CM | POA: Diagnosis not present

## 2021-05-07 DIAGNOSIS — D638 Anemia in other chronic diseases classified elsewhere: Secondary | ICD-10-CM | POA: Diagnosis not present

## 2021-05-07 DIAGNOSIS — J9691 Respiratory failure, unspecified with hypoxia: Secondary | ICD-10-CM

## 2021-05-07 DIAGNOSIS — J704 Drug-induced interstitial lung disorders, unspecified: Secondary | ICD-10-CM | POA: Diagnosis present

## 2021-05-07 DIAGNOSIS — F419 Anxiety disorder, unspecified: Secondary | ICD-10-CM | POA: Diagnosis present

## 2021-05-07 DIAGNOSIS — J189 Pneumonia, unspecified organism: Secondary | ICD-10-CM | POA: Diagnosis not present

## 2021-05-07 DIAGNOSIS — D72828 Other elevated white blood cell count: Secondary | ICD-10-CM | POA: Diagnosis present

## 2021-05-07 DIAGNOSIS — Z515 Encounter for palliative care: Secondary | ICD-10-CM | POA: Diagnosis not present

## 2021-05-07 DIAGNOSIS — R54 Age-related physical debility: Secondary | ICD-10-CM | POA: Diagnosis present

## 2021-05-07 DIAGNOSIS — Z955 Presence of coronary angioplasty implant and graft: Secondary | ICD-10-CM | POA: Diagnosis not present

## 2021-05-07 DIAGNOSIS — Z8249 Family history of ischemic heart disease and other diseases of the circulatory system: Secondary | ICD-10-CM

## 2021-05-07 DIAGNOSIS — C3431 Malignant neoplasm of lower lobe, right bronchus or lung: Secondary | ICD-10-CM | POA: Diagnosis present

## 2021-05-07 DIAGNOSIS — Z923 Personal history of irradiation: Secondary | ICD-10-CM

## 2021-05-07 DIAGNOSIS — A419 Sepsis, unspecified organism: Secondary | ICD-10-CM

## 2021-05-07 DIAGNOSIS — J90 Pleural effusion, not elsewhere classified: Secondary | ICD-10-CM | POA: Diagnosis not present

## 2021-05-07 DIAGNOSIS — T451X5A Adverse effect of antineoplastic and immunosuppressive drugs, initial encounter: Secondary | ICD-10-CM | POA: Diagnosis not present

## 2021-05-07 DIAGNOSIS — R0902 Hypoxemia: Secondary | ICD-10-CM | POA: Diagnosis not present

## 2021-05-07 DIAGNOSIS — J9621 Acute and chronic respiratory failure with hypoxia: Secondary | ICD-10-CM | POA: Diagnosis present

## 2021-05-07 DIAGNOSIS — J849 Interstitial pulmonary disease, unspecified: Secondary | ICD-10-CM | POA: Diagnosis not present

## 2021-05-07 DIAGNOSIS — R5381 Other malaise: Secondary | ICD-10-CM | POA: Diagnosis not present

## 2021-05-07 DIAGNOSIS — Z87891 Personal history of nicotine dependence: Secondary | ICD-10-CM | POA: Diagnosis not present

## 2021-05-07 DIAGNOSIS — R0602 Shortness of breath: Secondary | ICD-10-CM

## 2021-05-07 DIAGNOSIS — I25118 Atherosclerotic heart disease of native coronary artery with other forms of angina pectoris: Secondary | ICD-10-CM | POA: Diagnosis not present

## 2021-05-07 DIAGNOSIS — Z9221 Personal history of antineoplastic chemotherapy: Secondary | ICD-10-CM | POA: Diagnosis not present

## 2021-05-07 DIAGNOSIS — R0781 Pleurodynia: Secondary | ICD-10-CM

## 2021-05-07 DIAGNOSIS — R0689 Other abnormalities of breathing: Secondary | ICD-10-CM | POA: Diagnosis not present

## 2021-05-07 DIAGNOSIS — R0603 Acute respiratory distress: Secondary | ICD-10-CM | POA: Diagnosis not present

## 2021-05-07 DIAGNOSIS — N1831 Chronic kidney disease, stage 3a: Secondary | ICD-10-CM | POA: Diagnosis present

## 2021-05-07 DIAGNOSIS — N183 Chronic kidney disease, stage 3 unspecified: Secondary | ICD-10-CM | POA: Diagnosis not present

## 2021-05-07 DIAGNOSIS — I700 Atherosclerosis of aorta: Secondary | ICD-10-CM | POA: Diagnosis not present

## 2021-05-07 DIAGNOSIS — Z86711 Personal history of pulmonary embolism: Secondary | ICD-10-CM | POA: Diagnosis not present

## 2021-05-07 DIAGNOSIS — R911 Solitary pulmonary nodule: Secondary | ICD-10-CM | POA: Diagnosis not present

## 2021-05-07 DIAGNOSIS — Z743 Need for continuous supervision: Secondary | ICD-10-CM | POA: Diagnosis not present

## 2021-05-07 DIAGNOSIS — I5042 Chronic combined systolic (congestive) and diastolic (congestive) heart failure: Secondary | ICD-10-CM | POA: Diagnosis present

## 2021-05-07 DIAGNOSIS — Z7401 Bed confinement status: Secondary | ICD-10-CM

## 2021-05-07 DIAGNOSIS — Z20822 Contact with and (suspected) exposure to covid-19: Secondary | ICD-10-CM | POA: Diagnosis not present

## 2021-05-07 DIAGNOSIS — Z66 Do not resuscitate: Secondary | ICD-10-CM | POA: Diagnosis present

## 2021-05-07 DIAGNOSIS — I214 Non-ST elevation (NSTEMI) myocardial infarction: Principal | ICD-10-CM | POA: Diagnosis present

## 2021-05-07 DIAGNOSIS — T50905A Adverse effect of unspecified drugs, medicaments and biological substances, initial encounter: Secondary | ICD-10-CM | POA: Diagnosis not present

## 2021-05-07 DIAGNOSIS — F32A Depression, unspecified: Secondary | ICD-10-CM | POA: Diagnosis present

## 2021-05-07 DIAGNOSIS — I252 Old myocardial infarction: Secondary | ICD-10-CM

## 2021-05-07 DIAGNOSIS — D6481 Anemia due to antineoplastic chemotherapy: Secondary | ICD-10-CM | POA: Diagnosis present

## 2021-05-07 DIAGNOSIS — Z7901 Long term (current) use of anticoagulants: Secondary | ICD-10-CM

## 2021-05-07 DIAGNOSIS — J9690 Respiratory failure, unspecified, unspecified whether with hypoxia or hypercapnia: Secondary | ICD-10-CM | POA: Diagnosis not present

## 2021-05-07 DIAGNOSIS — I420 Dilated cardiomyopathy: Secondary | ICD-10-CM | POA: Diagnosis not present

## 2021-05-07 DIAGNOSIS — R0600 Dyspnea, unspecified: Secondary | ICD-10-CM | POA: Diagnosis not present

## 2021-05-07 DIAGNOSIS — Z809 Family history of malignant neoplasm, unspecified: Secondary | ICD-10-CM

## 2021-05-07 DIAGNOSIS — Z7189 Other specified counseling: Secondary | ICD-10-CM | POA: Diagnosis not present

## 2021-05-07 DIAGNOSIS — J479 Bronchiectasis, uncomplicated: Secondary | ICD-10-CM | POA: Diagnosis not present

## 2021-05-07 DIAGNOSIS — Z9981 Dependence on supplemental oxygen: Secondary | ICD-10-CM

## 2021-05-07 DIAGNOSIS — I9589 Other hypotension: Secondary | ICD-10-CM | POA: Diagnosis present

## 2021-05-07 DIAGNOSIS — G893 Neoplasm related pain (acute) (chronic): Secondary | ICD-10-CM | POA: Diagnosis not present

## 2021-05-07 DIAGNOSIS — N179 Acute kidney failure, unspecified: Secondary | ICD-10-CM | POA: Diagnosis present

## 2021-05-07 DIAGNOSIS — R64 Cachexia: Secondary | ICD-10-CM | POA: Diagnosis present

## 2021-05-07 DIAGNOSIS — I2510 Atherosclerotic heart disease of native coronary artery without angina pectoris: Secondary | ICD-10-CM | POA: Diagnosis present

## 2021-05-07 DIAGNOSIS — I517 Cardiomegaly: Secondary | ICD-10-CM | POA: Diagnosis not present

## 2021-05-07 DIAGNOSIS — Z6820 Body mass index (BMI) 20.0-20.9, adult: Secondary | ICD-10-CM

## 2021-05-07 DIAGNOSIS — E876 Hypokalemia: Secondary | ICD-10-CM | POA: Diagnosis not present

## 2021-05-07 DIAGNOSIS — D630 Anemia in neoplastic disease: Secondary | ICD-10-CM | POA: Diagnosis not present

## 2021-05-07 DIAGNOSIS — O8604 Sepsis following an obstetrical procedure: Secondary | ICD-10-CM

## 2021-05-07 DIAGNOSIS — I255 Ischemic cardiomyopathy: Secondary | ICD-10-CM | POA: Diagnosis present

## 2021-05-07 DIAGNOSIS — R531 Weakness: Secondary | ICD-10-CM | POA: Diagnosis not present

## 2021-05-07 DIAGNOSIS — J9622 Acute and chronic respiratory failure with hypercapnia: Secondary | ICD-10-CM | POA: Diagnosis present

## 2021-05-07 DIAGNOSIS — R059 Cough, unspecified: Secondary | ICD-10-CM | POA: Diagnosis not present

## 2021-05-07 DIAGNOSIS — G894 Chronic pain syndrome: Secondary | ICD-10-CM | POA: Diagnosis present

## 2021-05-07 DIAGNOSIS — R69 Illness, unspecified: Secondary | ICD-10-CM | POA: Diagnosis not present

## 2021-05-07 DIAGNOSIS — I959 Hypotension, unspecified: Secondary | ICD-10-CM | POA: Diagnosis present

## 2021-05-07 DIAGNOSIS — J439 Emphysema, unspecified: Secondary | ICD-10-CM | POA: Diagnosis not present

## 2021-05-07 DIAGNOSIS — J9601 Acute respiratory failure with hypoxia: Secondary | ICD-10-CM | POA: Diagnosis not present

## 2021-05-07 DIAGNOSIS — E785 Hyperlipidemia, unspecified: Secondary | ICD-10-CM | POA: Diagnosis present

## 2021-05-07 DIAGNOSIS — R918 Other nonspecific abnormal finding of lung field: Secondary | ICD-10-CM | POA: Diagnosis not present

## 2021-05-07 DIAGNOSIS — G4700 Insomnia, unspecified: Secondary | ICD-10-CM | POA: Diagnosis present

## 2021-05-07 DIAGNOSIS — T380X5A Adverse effect of glucocorticoids and synthetic analogues, initial encounter: Secondary | ICD-10-CM | POA: Diagnosis present

## 2021-05-07 DIAGNOSIS — J690 Pneumonitis due to inhalation of food and vomit: Secondary | ICD-10-CM | POA: Diagnosis not present

## 2021-05-07 LAB — COMPREHENSIVE METABOLIC PANEL
ALT: 15 U/L (ref 0–44)
AST: 22 U/L (ref 15–41)
Albumin: 2.6 g/dL — ABNORMAL LOW (ref 3.5–5.0)
Alkaline Phosphatase: 80 U/L (ref 38–126)
Anion gap: 11 (ref 5–15)
BUN: 27 mg/dL — ABNORMAL HIGH (ref 8–23)
CO2: 27 mmol/L (ref 22–32)
Calcium: 8.7 mg/dL — ABNORMAL LOW (ref 8.9–10.3)
Chloride: 101 mmol/L (ref 98–111)
Creatinine, Ser: 1.71 mg/dL — ABNORMAL HIGH (ref 0.61–1.24)
GFR, Estimated: 43 mL/min — ABNORMAL LOW (ref >60)
Glucose, Bld: 120 mg/dL — ABNORMAL HIGH (ref 70–99)
Potassium: 4.1 mmol/L (ref 3.5–5.1)
Sodium: 139 mmol/L (ref 135–145)
Total Bilirubin: 0.9 mg/dL (ref 0.3–1.2)
Total Protein: 6.5 g/dL (ref 6.5–8.1)

## 2021-05-07 LAB — URINALYSIS, COMPLETE (UACMP) WITH MICROSCOPIC
Bacteria, UA: NONE SEEN
Bilirubin Urine: NEGATIVE
Glucose, UA: NEGATIVE mg/dL
Hgb urine dipstick: NEGATIVE
Ketones, ur: NEGATIVE mg/dL
Leukocytes,Ua: NEGATIVE
Nitrite: NEGATIVE
Protein, ur: NEGATIVE mg/dL
Specific Gravity, Urine: 1.021 (ref 1.005–1.030)
Squamous Epithelial / HPF: NONE SEEN (ref 0–5)
pH: 6 (ref 5.0–8.0)

## 2021-05-07 LAB — PROCALCITONIN: Procalcitonin: 1.38 ng/mL

## 2021-05-07 LAB — CBC WITH DIFFERENTIAL/PLATELET
Abs Immature Granulocytes: 0.16 10*3/uL — ABNORMAL HIGH (ref 0.00–0.07)
Basophils Absolute: 0 10*3/uL (ref 0.0–0.1)
Basophils Relative: 0 %
Eosinophils Absolute: 0.1 10*3/uL (ref 0.0–0.5)
Eosinophils Relative: 1 %
HCT: 30.2 % — ABNORMAL LOW (ref 39.0–52.0)
Hemoglobin: 9.9 g/dL — ABNORMAL LOW (ref 13.0–17.0)
Immature Granulocytes: 1 %
Lymphocytes Relative: 1 %
Lymphs Abs: 0.2 10*3/uL — ABNORMAL LOW (ref 0.7–4.0)
MCH: 30.8 pg (ref 26.0–34.0)
MCHC: 32.8 g/dL (ref 30.0–36.0)
MCV: 94.1 fL (ref 80.0–100.0)
Monocytes Absolute: 1.8 10*3/uL — ABNORMAL HIGH (ref 0.1–1.0)
Monocytes Relative: 11 %
Neutro Abs: 14.2 10*3/uL — ABNORMAL HIGH (ref 1.7–7.7)
Neutrophils Relative %: 86 %
Platelets: 275 10*3/uL (ref 150–400)
RBC: 3.21 MIL/uL — ABNORMAL LOW (ref 4.22–5.81)
RDW: 20.8 % — ABNORMAL HIGH (ref 11.5–15.5)
WBC: 16.6 10*3/uL — ABNORMAL HIGH (ref 4.0–10.5)
nRBC: 0 % (ref 0.0–0.2)

## 2021-05-07 LAB — LACTIC ACID, PLASMA: Lactic Acid, Venous: 0.9 mmol/L (ref 0.5–1.9)

## 2021-05-07 LAB — TROPONIN I (HIGH SENSITIVITY)
Troponin I (High Sensitivity): 3153 ng/L (ref <18)
Troponin I (High Sensitivity): 3506 ng/L (ref <18)

## 2021-05-07 LAB — RESP PANEL BY RT-PCR (FLU A&B, COVID) ARPGX2
Influenza A by PCR: NEGATIVE
Influenza B by PCR: NEGATIVE
SARS Coronavirus 2 by RT PCR: NEGATIVE

## 2021-05-07 LAB — PROTIME-INR
INR: 1.7 — ABNORMAL HIGH (ref 0.8–1.2)
Prothrombin Time: 20 seconds — ABNORMAL HIGH (ref 11.4–15.2)

## 2021-05-07 LAB — APTT: aPTT: 50 seconds — ABNORMAL HIGH (ref 24–36)

## 2021-05-07 LAB — D-DIMER, QUANTITATIVE: D-Dimer, Quant: 1.7 ug/mL-FEU — ABNORMAL HIGH (ref 0.00–0.50)

## 2021-05-07 LAB — BRAIN NATRIURETIC PEPTIDE: B Natriuretic Peptide: 1105.5 pg/mL — ABNORMAL HIGH (ref 0.0–100.0)

## 2021-05-07 MED ORDER — OXYCODONE HCL 5 MG PO TABS
10.0000 mg | ORAL_TABLET | ORAL | Status: DC | PRN
  Administered 2021-05-08: 10 mg via ORAL
  Administered 2021-05-08: 15 mg via ORAL
  Administered 2021-05-08: 10 mg via ORAL
  Administered 2021-05-09 – 2021-05-14 (×15): 15 mg via ORAL
  Administered 2021-05-14: 10 mg via ORAL
  Administered 2021-05-15 – 2021-05-21 (×26): 15 mg via ORAL
  Administered 2021-05-22: 10 mg via ORAL
  Administered 2021-05-22 – 2021-05-26 (×22): 15 mg via ORAL
  Administered 2021-05-27 (×2): 10 mg via ORAL
  Administered 2021-05-27: 15 mg via ORAL
  Administered 2021-05-27: 5 mg via ORAL
  Administered 2021-05-27 – 2021-06-01 (×23): 15 mg via ORAL
  Administered 2021-06-02: 10 mg via ORAL
  Administered 2021-06-02 (×3): 15 mg via ORAL
  Administered 2021-06-03: 10 mg via ORAL
  Administered 2021-06-03 – 2021-06-13 (×31): 15 mg via ORAL
  Filled 2021-05-07: qty 3
  Filled 2021-05-07: qty 2
  Filled 2021-05-07: qty 3
  Filled 2021-05-07: qty 2
  Filled 2021-05-07 (×56): qty 3
  Filled 2021-05-07: qty 2
  Filled 2021-05-07 (×2): qty 3
  Filled 2021-05-07: qty 2
  Filled 2021-05-07 (×27): qty 3
  Filled 2021-05-07: qty 2
  Filled 2021-05-07 (×18): qty 3
  Filled 2021-05-07: qty 2
  Filled 2021-05-07 (×4): qty 3
  Filled 2021-05-07: qty 2
  Filled 2021-05-07: qty 3
  Filled 2021-05-07: qty 2
  Filled 2021-05-07 (×13): qty 3

## 2021-05-07 MED ORDER — POLYETHYLENE GLYCOL 3350 17 G PO PACK
17.0000 g | PACK | Freq: Two times a day (BID) | ORAL | Status: DC | PRN
Start: 2021-05-07 — End: 2021-06-14

## 2021-05-07 MED ORDER — HEPARIN SODIUM (PORCINE) 5000 UNIT/ML IJ SOLN
60.0000 [IU]/kg | Freq: Once | INTRAMUSCULAR | Status: AC
  Administered 2021-05-07: 3500 [IU] via INTRAVENOUS
  Filled 2021-05-07: qty 1

## 2021-05-07 MED ORDER — HEPARIN (PORCINE) 25000 UT/250ML-% IV SOLN
1100.0000 [IU]/h | INTRAVENOUS | Status: AC
  Administered 2021-05-07: 700 [IU]/h via INTRAVENOUS
  Administered 2021-05-09: 900 [IU]/h via INTRAVENOUS
  Filled 2021-05-07 (×2): qty 250

## 2021-05-07 MED ORDER — ONDANSETRON HCL 4 MG/2ML IJ SOLN: 4.0000 mg | Freq: Four times a day (QID) | INTRAMUSCULAR | Status: DC | PRN

## 2021-05-07 MED ORDER — ACETAMINOPHEN 500 MG PO TABS: 1000.0000 mg | ORAL_TABLET | Freq: Three times a day (TID) | ORAL | Status: DC | PRN

## 2021-05-07 MED ORDER — ONDANSETRON 4 MG PO TBDP
4.0000 mg | ORAL_TABLET | Freq: Three times a day (TID) | ORAL | Status: DC | PRN
  Filled 2021-05-07: qty 1

## 2021-05-07 MED ORDER — FLUOXETINE HCL 20 MG PO CAPS
40.0000 mg | ORAL_CAPSULE | Freq: Every day | ORAL | Status: DC
  Administered 2021-05-08 – 2021-06-13 (×37): 40 mg via ORAL
  Filled 2021-05-07 (×37): qty 2

## 2021-05-07 MED ORDER — OLANZAPINE 10 MG PO TABS
10.0000 mg | ORAL_TABLET | Freq: Every day | ORAL | Status: DC
  Administered 2021-05-09 – 2021-06-12 (×35): 10 mg via ORAL
  Filled 2021-05-07 (×37): qty 1

## 2021-05-07 MED ORDER — LACTATED RINGERS IV BOLUS (SEPSIS)
1000.0000 mL | Freq: Once | INTRAVENOUS | Status: AC
  Administered 2021-05-07: 1000 mL via INTRAVENOUS

## 2021-05-07 MED ORDER — VANCOMYCIN HCL IN DEXTROSE 1-5 GM/200ML-% IV SOLN
1000.0000 mg | Freq: Once | INTRAVENOUS | Status: AC
  Administered 2021-05-07: 1000 mg via INTRAVENOUS
  Filled 2021-05-07: qty 200

## 2021-05-07 MED ORDER — GUAIFENESIN-DM 100-10 MG/5ML PO SYRP
10.0000 mL | ORAL_SOLUTION | Freq: Four times a day (QID) | ORAL | Status: DC | PRN
  Administered 2021-05-11 – 2021-05-21 (×6): 10 mL via ORAL
  Filled 2021-05-07 (×6): qty 10

## 2021-05-07 MED ORDER — ALPRAZOLAM 0.5 MG PO TABS
1.0000 mg | ORAL_TABLET | Freq: Three times a day (TID) | ORAL | Status: DC | PRN
  Administered 2021-05-07 – 2021-05-31 (×53): 1 mg via ORAL
  Filled 2021-05-07 (×56): qty 2

## 2021-05-07 MED ORDER — IOHEXOL 350 MG/ML SOLN
60.0000 mL | Freq: Once | INTRAVENOUS | Status: AC | PRN
  Administered 2021-05-07: 60 mL via INTRAVENOUS

## 2021-05-07 MED ORDER — VANCOMYCIN VARIABLE DOSE PER UNSTABLE RENAL FUNCTION (PHARMACIST DOSING): Status: DC

## 2021-05-07 MED ORDER — DOCUSATE SODIUM 100 MG PO CAPS: 100.0000 mg | ORAL_CAPSULE | Freq: Two times a day (BID) | ORAL | Status: DC | PRN

## 2021-05-07 MED ORDER — VANCOMYCIN HCL 500 MG/100ML IV SOLN
500.0000 mg | Freq: Once | INTRAVENOUS | Status: AC
  Administered 2021-05-07: 500 mg via INTRAVENOUS
  Filled 2021-05-07: qty 100

## 2021-05-07 MED ORDER — SODIUM CHLORIDE 0.9 % IV SOLN: 2.0000 g | Freq: Two times a day (BID) | INTRAVENOUS | Status: DC

## 2021-05-07 MED ORDER — SODIUM CHLORIDE 0.9 % IV SOLN
2.0000 g | Freq: Once | INTRAVENOUS | Status: AC
  Administered 2021-05-07: 2 g via INTRAVENOUS
  Filled 2021-05-07: qty 2

## 2021-05-07 MED ORDER — ENSURE ENLIVE PO LIQD
237.0000 mL | Freq: Three times a day (TID) | ORAL | Status: DC
  Administered 2021-05-08 – 2021-06-13 (×54): 237 mL via ORAL

## 2021-05-07 MED ORDER — LACTATED RINGERS IV SOLN: INTRAVENOUS | Status: AC

## 2021-05-07 MED ORDER — IPRATROPIUM-ALBUTEROL 0.5-2.5 (3) MG/3ML IN SOLN
3.0000 mL | Freq: Four times a day (QID) | RESPIRATORY_TRACT | Status: DC
  Administered 2021-05-07 – 2021-05-08 (×3): 3 mL via RESPIRATORY_TRACT
  Filled 2021-05-07 (×3): qty 3

## 2021-05-07 MED ORDER — SODIUM CHLORIDE 0.9 % IV SOLN
2.0000 g | Freq: Two times a day (BID) | INTRAVENOUS | Status: DC
  Administered 2021-05-08 – 2021-05-13 (×11): 2 g via INTRAVENOUS
  Filled 2021-05-07 (×12): qty 2

## 2021-05-07 NOTE — ED Provider Notes (Signed)
   Clinical Course as of 05/07/21 1734  Tue May 07, 2021  1510 Patient received in signout from Dr. Rip Harbour pending remainder of blood work and CT imaging.  Lung cancer patient who appears to be septic from a pneumonia despite recent medical admission for pneumonia. [DS]  9323 Rather elevated troponin is noted.  I reviewed patient's EKG again without ischemic features.  Discussed with the nurse the need for heparinization.  I reevaluated patient at the bedside and he reports feeling "okay" and denies any chest pain or pressure.  Reports primarily a cough and some shortness of breath at home, which were his presenting symptoms.  We discussed NSTEMI, treatment for this and answered questions.  Awaiting CTA chest. [DS]  1733 I discuss with Dr. Billie Ruddy, who agrees to admit [DS]    Clinical Course User Index [DS] Vladimir Crofts, MD   .1-3 Lead EKG Interpretation Performed by: Vladimir Crofts, MD Authorized by: Vladimir Crofts, MD     Interpretation: normal     ECG rate:  90   ECG rate assessment: normal     Rhythm: sinus rhythm     Ectopy: none     Conduction: normal   .Critical Care Performed by: Vladimir Crofts, MD Authorized by: Vladimir Crofts, MD   Critical care provider statement:    Critical care time (minutes):  30   Critical care was necessary to treat or prevent imminent or life-threatening deterioration of the following conditions:  Sepsis   Critical care was time spent personally by me on the following activities:  Evaluation of patient's response to treatment, examination of patient, discussions with consultants, re-evaluation of patient's condition and review of old charts        Vladimir Crofts, MD 05/07/21 1734

## 2021-05-07 NOTE — ED Notes (Signed)
Pt received dinner tray from dietary.

## 2021-05-07 NOTE — Consult Note (Signed)
PHARMACY -  BRIEF ANTIBIOTIC NOTE   Pharmacy has received consult(s) for vancomycin and cefepime from an ED provider.  The patient's profile has been reviewed for ht/wt/allergies/indication/available labs.    One time order(s) placed for cefepime 2g and vancomycin 1g  Further antibiotics/pharmacy consults should be ordered by admitting physician if indicated.                       Thank you, Narda Rutherford, PharmD Pharmacy Resident  05/07/2021 3:59 PM

## 2021-05-07 NOTE — ED Triage Notes (Signed)
Arrives via [facility] with CC of SOB. 2L at home chronically. EMS got 80s room air SpO2 on arrival. BP 95/57 after 200 mL NS. CBG 200. Recent pneumonia treatment.

## 2021-05-07 NOTE — ED Notes (Signed)
Per day shift RN - Admitting MD aware of the patients soft Bps. This RN reiterated this info with Dr. Billie Ruddy.

## 2021-05-07 NOTE — ED Notes (Signed)
MD Billie Ruddy made aware of patients blood pressure 94/59 at 1800. MD reports acceptable blood pressure at this time

## 2021-05-07 NOTE — Consult Note (Signed)
CODE SEPSIS - PHARMACY COMMUNICATION  **Broad Spectrum Antibiotics should be administered within 1 hour of Sepsis diagnosis**  Time Code Sepsis Called/Page Received: 1557  Antibiotics Ordered: 1600  Time of 1st antibiotic administration: 1638  Additional action taken by pharmacy: N/A  If necessary, Name of Provider/Nurse Contacted: N/A  Lorna Dibble, PharmD, [place] Pharmacist 05/07/2021 4:58 PM

## 2021-05-07 NOTE — H&P (Signed)
History and Physical    Kyran Connaughton OZD:[id number] DOB: May 12, 1951 DOA: 05/07/2021  PCP: Pcp, No  Patient coming from: home  I have personally briefly reviewed patient's old medical records in [place]  Chief Complaint: weakness and shortness of breath  HPI: Jimmy West is a 70 y.o. 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  Pt was recently discharged on 04/28/2021 on 2L O2 after presenting with weakness and treated for aspiration PNA.  Pt said after he went home, he started feeling bad quickly.  Mainly, he was feeling very weak, and stayed in bed mostly and couldn't do anything.  Pt wasn't initially not wearing his supplemental 2L O2, but when became more short of breath, so he started using it.  Reported cough but not bringing up sputum.  No fever, chest pain, abdominal pain, N/V, dysuria, increased swelling.  Pt takes opioids pain meds for back pain associated with tumor growth, also for whole-body pain after recent fall.  Pt lives alone, and has been subsisting on 3-4 Ensure per day.  ED Course: Per EMS, sats 80's on room air.  BP 95/57 after 200 mL.  After arrival, afebrile, pulse 97, BP 103/74, RR 36, sating 91% on 6L.  Labs notable for WBC 16.6, Hgb 9.9, Cr 1.71 (was 1.30 at last discharge), BNP 1105, trop 3153 then 3506.  COVID and flu neg.  CXR showed "New left lower lobe airspace process most consistent with pneumonia.  Extensive right lower lobe density likely radiation change."  CTA chest neg for PE.  Pt received LR x2 liters boluses, vanc and cefepime, and started on heparin gtt in the ED before admission to PCU.  Assessment/Plan Active Problems:   Acute hypoxemic  respiratory failure (HCC)  # Acute on chronic hypoxemic respiratory failure 2/2 # LLL PNA --O2 sats 80's per EMS, was put on 6L.  CXR showed new left lower lobe airspace process most consistent with pneumonia.  Procal elevated at 1.38.  New leukocytosis. --started on vanc/cefepime in the ED, since pt was recently treated for aspiration PNA with IV Unasyn and then augmentin. Plan: --cont vanc and cefepime --Continue supplemental O2 to keep sats >=92%, wean as tolerated --DuoNeb q6h --consult RT   # Severe Sepsis, POA --tachycardia, tachypnea, leukocytosis with WBC 16.6, AKI with source of PNA. --started abx on presentation.  Blood cx x2 obtained.  # Possible NSTEMI --trop 3153 then 3506.  No chest pain.  No ACS-related change on EKG.  Started on heparin gtt in the ED. Plan: --cont heparin gtt for now --trend trop --Echo --cardiology consult placed (messaged Dr. Lovena Le, but should message oncall [facility] provider tomorrow morning).  # CAD s/p stent placement in 2005 --not on cardiac meds at home.  # AKI, POA --on presentation, Cr 1.71 (was 1.30 at last discharge on 04/28/21).  Likely due to poor hydration. --s/p 2L LR in the ED. Plan: --cont MIVF@75   # Hypotension, POA --systolic 25'Z-56'L.  S/p 2L LR.  Not on BP meds at home.  BP appeared to be soft also during last hospitalization, so likely close to baseline.  Also had poor oral intake and hydration prior to presentation. Plan: --cont MIVF@75   # Stage IV adenocarcinoma of the lung  --had received radiation  and chemo previously, but not currently due to poor clinical and functional status  # Chronic anemia --due to antineoplastic therapy and chronic disease.  Hgb stable ~9's.  # Chronic pain on chronic opioids --cont home oxycodone --Bowel regimen PRN  # Anxiety and depression --cont home Prozac, Zyprexa and PRN Xanax   DVT prophylaxis: JG:[id number] gtt Code Status: DNR  Family Communication:   Disposition Plan: home   Consults called: cardiology  Level of care: Progressive Cardiac   Review of Systems: As per HPI otherwise complete review of systems negative.   Past Medical History:  Diagnosis Date   Bulging lumbar disc    Cancer ([place])    stage 4 lung cancer   Heart attack [facility])     Past Surgical History:  Procedure Laterality Date   CARDIAC CATHETERIZATION     two stents   KNEE SURGERY Left    PORTA CATH INSERTION N/A 05/21/2018   Procedure: PORTA CATH INSERTION;  Surgeon: Algernon Huxley, MD;  Location: [facility];  Service: Cardiovascular;  Laterality: N/A;     reports that he quit smoking about 17 years ago. His smoking use included cigarettes. He has a 22.50 pack-year smoking history. He has never used smokeless tobacco. He reports that he does not currently use alcohol. He reports that he does not use drugs.  No Known Allergies  Family History  Problem Relation Age of Onset   Heart failure Father    Cancer Maternal Aunt    Heart failure Maternal Uncle    Dementia Paternal Grandmother    Cancer Maternal Aunt     Prior to Admission medications   Medication Sig Start Date End Date Taking? Authorizing Provider  albuterol (VENTOLIN HFA) 108 (90 Base) MCG/ACT inhaler Inhale 2 puffs into the lungs every 6 (six) hours as needed for wheezing or shortness of breath. 04/09/21  Yes Cammie Sickle, MD  ALPRAZolam Duanne Moron) 1 MG tablet Take 1 tablet (1 mg total) by mouth 3 (three) times daily as needed for anxiety. 04/29/21  Yes Borders, Kirt Boys, NP  apixaban (ELIQUIS) 5 MG TABS tablet Take 1 tablet (5 mg total) by mouth 2 (two) times daily. 10/23/20  Yes Corcoran, Melissa C, MD  dexamethasone (DECADRON) 4 MG tablet TAKE 1 TABLET BY MOUTH TWICE A DAY BEFORE ALIMTA CHEMO, THEN TAKE 2 TABS ONCE DAILY X 3 DAYS STARTING DAY AFTER CHEMO 02/27/21  Yes Cammie Sickle, MD  FLUoxetine (PROZAC) 40 MG capsule Take 1 capsule (40 mg total) by mouth daily. 05/03/21  Yes Borders, Kirt Boys, NP   folic acid (FOLVITE) 1 MG tablet Take 1 tablet (1 mg total) by mouth daily. Continue until 21 days after Alimta completed. 12/03/20  Yes Borders, Kirt Boys, NP  OLANZapine (ZYPREXA) 10 MG tablet Take 1 tablet (10 mg total) by mouth at bedtime. 04/03/21  Yes Borders, Kirt Boys, NP  Oxycodone HCl 20 MG TABS Take 1-1.5 tablets (20-30 mg total) by mouth every 4 (four) hours as needed. 04/29/21  Yes Borders, Kirt Boys, NP  polyethylene glycol (MIRALAX / GLYCOLAX) packet Take 17 g by mouth daily.   Yes [provider]  prochlorperazine (COMPAZINE) 10 MG tablet Take 1 tablet (10 mg total) by mouth every 6 (six) hours as needed for nausea or vomiting. 10/10/20  Yes Borders, Kirt Boys, NP  senna (SENOKOT) 8.6 MG tablet Take 1 tablet by mouth as needed.    Yes [provider]    Physical Exam: Vitals:  05/07/21 1730 05/07/21 1745 05/07/21 1800 05/07/21 1830  BP: (!) 86/53  (!) 94/59 96/60  Pulse: 88 88 88 91  Resp: 16 (!) 31 (!) 24 (!) 23  Temp:      TempSrc:      SpO2: 93% 95% 98% 95%  Weight:      Height:        Constitutional: NAD, AAOx3 HEENT: conjunctivae and lids normal, EOMI CV: No cyanosis.   RESP: normal respiratory effort, audible high-pitched expiratory wheezing, on 5L Extremities: No effusions, edema in BLE SKIN: warm, dry Neuro: II - XII grossly intact.   Psych: Normal mood and affect.  Appropriate judgement and reason   Labs on Admission: I have personally reviewed following labs and imaging studies  CBC: Recent Labs  Lab 05/07/21 1446  WBC 16.6*  NEUTROABS 14.2*  HGB 9.9*  HCT 30.2*  MCV 94.1  PLT 546   Basic Metabolic Panel: Recent Labs  Lab 05/07/21 1446  NA 139  K 4.1  CL 101  CO2 27  GLUCOSE 120*  BUN 27*  CREATININE 1.71*  CALCIUM 8.7*   GFR: Estimated Creatinine Clearance: 33.2 mL/min (A) (by C-G formula based on SCr of 1.71 mg/dL (H)). Liver Function Tests: Recent Labs  Lab 05/07/21 1446  AST 22  ALT 15  ALKPHOS 80  BILITOT  0.9  PROT 6.5  ALBUMIN 2.6*   No results for input(s): LIPASE, AMYLASE in the last 168 hours. No results for input(s): AMMONIA in the last 168 hours. Coagulation Profile: Recent Labs  Lab 05/07/21 1446  INR 1.7*   Cardiac Enzymes: No results for input(s): CKTOTAL, CKMB, CKMBINDEX, TROPONINI in the last 168 hours. BNP (last 3 results) No results for input(s): PROBNP in the last 8760 hours. HbA1C: No results for input(s): HGBA1C in the last 72 hours. CBG: No results for input(s): GLUCAP in the last 168 hours. Lipid Profile: No results for input(s): CHOL, HDL, LDLCALC, TRIG, CHOLHDL, LDLDIRECT in the last 72 hours. Thyroid Function Tests: No results for input(s): TSH, T4TOTAL, FREET4, T3FREE, THYROIDAB in the last 72 hours. Anemia Panel: No results for input(s): VITAMINB12, FOLATE, FERRITIN, TIBC, IRON, RETICCTPCT in the last 72 hours. Urine analysis:    Component Value Date/Time   COLORURINE STRAW (A) 04/27/2021 0005   APPEARANCEUR CLEAR (A) 04/27/2021 0005   LABSPEC 1.012 04/27/2021 0005   PHURINE 5.0 04/27/2021 0005   GLUCOSEU NEGATIVE 04/27/2021 0005   HGBUR NEGATIVE 04/27/2021 0005   BILIRUBINUR NEGATIVE 04/27/2021 0005   KETONESUR NEGATIVE 04/27/2021 0005   PROTEINUR NEGATIVE 04/27/2021 0005   NITRITE NEGATIVE 04/27/2021 0005   LEUKOCYTESUR NEGATIVE 04/27/2021 0005    Radiological Exams on Admission: CT Angio Chest PE W and/or Wo Contrast  Result Date: 05/07/2021 CLINICAL DATA:  Weakness and cough. History of metastatic lung cancer. EXAM: CT ANGIOGRAPHY CHEST WITH CONTRAST TECHNIQUE: Multidetector CT imaging of the chest was performed using the standard protocol during bolus administration of intravenous contrast. Multiplanar CT image reconstructions and MIPs were obtained to evaluate the vascular anatomy. CONTRAST:  90mL OMNIPAQUE IOHEXOL 350 MG/ML SOLN COMPARISON:  11/02/2019 FINDINGS: Cardiovascular: The heart is mildly enlarged but stable. No pericardial effusion.  Stable tortuosity and calcification of the thoracic aorta but no aneurysm or dissection. The branch vessels are patent. Three-vessel coronary artery calcifications are noted. The right IJ power port is in unchanged position. No complicating features. The pulmonary arterial tree is well opacified. No filling defects to suggest pulmonary embolism. Mediastinum/Nodes: No mediastinal or hilar mass or  lymphadenopathy the. The esophagus is grossly. Lungs/Pleura: Stable underlying advanced emphysematous changes and areas of pulmonary scarring. Right lower lobe lung mass measures approximately 2.8 x 2.2 cm and previously measured 3.3 x 3.2 cm. Some surrounding interstitial scarring changes likely related to radiation. Peripheral right upper lobe lesion on image number 29/6 is stable measuring a maximum of 13 mm. Diffuse interstitial and airspace process in both lungs is most likely an infectious process. Aspiration would be a consideration. There is also a small right pleural effusion. Upper Abdomen: Few tiny low-attenuation hepatic lesions are stable. Stable splenomegaly. Stable right renal cyst. Musculoskeletal: No chest wall mass, supraclavicular or axillary adenopathy. The bony thorax is intact. No worrisome bone lesions. Stable severe compression deformity of T4 with vertebral plana appearance. Review of the MIP images confirms the above findings. IMPRESSION: 1. No CT findings for pulmonary embolism. 2. Stable tortuosity and calcification of the thoracic aorta but no dissection. 3. Interval decrease in size of the right lower lobe lung mass. The right upper lobe peripheral pulmonary nodule is stable. 4. Interstitial and airspace process in both lungs is likely an infectious process. Aspiration would be a consideration. Aortic Atherosclerosis (ICD10-I70.0) and Emphysema (ICD10-J43.9). Electronically Signed   By: Marijo Sanes M.D.   On: 05/07/2021 16:56   DG Chest Port 1 View  Result Date: 05/07/2021 CLINICAL DATA:   Sepsis.  History of right lower lobe lung cancer. EXAM: PORTABLE CHEST 1 VIEW COMPARISON:  Chest x-ray 04/24/2021.  PET-CT 01/03/2021 FINDINGS: The right IJ power port is in stable position. The cardiac silhouette, mediastinal and hilar contours are stable. Extensive opacity in the right lower lobe most likely radiation fibrosis. New left lower lobe airspace process most consistent with pneumonia. No definite pleural effusions or pneumothorax. IMPRESSION: New left lower lobe airspace process most consistent with pneumonia. Extensive right lower lobe density likely radiation change. Electronically Signed   By: Marijo Sanes M.D.   On: 05/07/2021 16:06      Enzo Bi MD [facility]  If 7PM-7AM, please contact night-coverage 05/07/2021, 6:44 PM

## 2021-05-07 NOTE — Sepsis Progress Note (Signed)
[facility] monitoring sepsis protocol 

## 2021-05-07 NOTE — ED Provider Notes (Signed)
[facility] Emergency Department Provider Note   ____________________________________________   Event Date/Time   First MD Initiated Contact with Patient 05/07/21 1449     (approximate)  I have reviewed the triage vital signs and the nursing notes.   HISTORY  Chief Complaint Shortness of Breath    HPI Jimmy West is a 70 y.o. male who reports he was recently in the hospital for pneumonia.  He has known lung cancer stage IV with mets.  He is coughing but the cough is nonproductive.  He feels weak and tired he is not really short of breath.  He has no pain.  O2 sats on his usual 2 L were in the 80s and you require oxygen up to 6 L to maintain good sats.  This is new for him.  Blood pressure when EMS arrived was low 95/57 went up after 200 cc of normal saline.  Patient has not been febrile.        Past Medical History:  Diagnosis Date   Bulging lumbar disc    Cancer ([place])    stage 4 lung cancer   Heart attack [facility])     Patient Active Problem List   Diagnosis Date Noted   Pneumonia 04/25/2021   Acute anemia 04/24/2021   Acute hypokalemia 04/24/2021   NSTEMI (non-ST elevated myocardial infarction) ([place]) 04/24/2021   Diarrhea 04/24/2021   Hypercalcemia 11/26/2020   Nonintractable headache 07/01/2020   Chronic anticoagulation 05/02/2020   Pleuritic pain 03/26/2020   Chemotherapy-induced neutropenia ([place]) 01/06/2020   Depression 12/21/2019   Palliative care encounter 08/26/2019   Anemia due to antineoplastic chemotherapy 06/27/2019   C. difficile diarrhea 06/26/2019   Pulmonary embolus ([place]) 06/20/2019   Anxiety in cancer patient 03/29/2019   Renal insufficiency 03/29/2019   Other fatigue 03/04/2019   Vomiting 03/04/2019   Chemotherapy induced nausea and vomiting 02/12/2019   Abdominal pain 02/11/2019   Mild protein-calorie malnutrition ([place]) 10/10/2018   Weight loss 09/20/2018   Elevated bilirubin 07/14/2018   Encounter for  antineoplastic chemotherapy 05/24/2018   Encounter for antineoplastic immunotherapy 05/24/2018   Goals of care, counseling/discussion 05/17/2018   Cancer-related pain 05/01/2018   Bone metastasis ([place]) 04/30/2018   Primary cancer of right lower lobe of lung ([place]) 04/26/2018   Chronic midline low back pain without sciatica 04/26/2018    Past Surgical History:  Procedure Laterality Date   CARDIAC CATHETERIZATION     two stents   KNEE SURGERY Left    PORTA CATH INSERTION N/A 05/21/2018   Procedure: PORTA CATH INSERTION;  Surgeon: Algernon Huxley, MD;  Location: [facility];  Service: Cardiovascular;  Laterality: N/A;    Prior to Admission medications   Medication Sig Start Date End Date Taking? Authorizing Provider  albuterol (VENTOLIN HFA) 108 (90 Base) MCG/ACT inhaler Inhale 2 puffs into the lungs every 6 (six) hours as needed for wheezing or shortness of breath. 04/09/21   Cammie Sickle, MD  ALPRAZolam Duanne Moron) 1 MG tablet Take 1 tablet (1 mg total) by mouth 3 (three) times daily as needed for anxiety. 04/29/21   Borders, Kirt Boys, NP  apixaban (ELIQUIS) 5 MG TABS tablet Take 1 tablet (5 mg total) by mouth 2 (two) times daily. 10/23/20   Lequita Asal, MD  dexamethasone (DECADRON) 4 MG tablet TAKE 1 TABLET BY MOUTH TWICE A DAY BEFORE ALIMTA CHEMO, THEN TAKE 2 TABS ONCE DAILY X 3 DAYS STARTING DAY AFTER CHEMO 02/27/21   Cammie Sickle,  MD  FLUoxetine (PROZAC) 40 MG capsule Take 1 capsule (40 mg total) by mouth daily. 05/03/21   Borders, Kirt Boys, NP  folic acid (FOLVITE) 1 MG tablet Take 1 tablet (1 mg total) by mouth daily. Continue until 21 days after Alimta completed. 12/03/20   Borders, Kirt Boys, NP  OLANZapine (ZYPREXA) 10 MG tablet Take 1 tablet (10 mg total) by mouth at bedtime. 04/03/21   Borders, Kirt Boys, NP  Oxycodone HCl 20 MG TABS Take 1-1.5 tablets (20-30 mg total) by mouth every 4 (four) hours as needed. 04/29/21   Borders, Kirt Boys, NP  polyethylene  glycol (MIRALAX / GLYCOLAX) packet Take 17 g by mouth daily.    [provider]  prochlorperazine (COMPAZINE) 10 MG tablet Take 1 tablet (10 mg total) by mouth every 6 (six) hours as needed for nausea or vomiting. 10/10/20   Borders, Kirt Boys, NP  senna (SENOKOT) 8.6 MG tablet Take 1 tablet by mouth as needed.     [provider]    Allergies Patient has no known allergies.  Family History  Problem Relation Age of Onset   Heart failure Father    Cancer Maternal Aunt    Heart failure Maternal Uncle    Dementia Paternal Grandmother    Cancer Maternal Aunt     Social History Social History   Tobacco Use   Smoking status: Former    Packs/day: 1.50    Years: 15.00    Pack years: 22.50    Types: Cigarettes    Quit date: 11/03/2003    Years since quitting: 17.5   Smokeless tobacco: Never  Vaping Use   Vaping Use: Never used  Substance Use Topics   Alcohol use: Not Currently   Drug use: No    Review of Systems  Constitutional: No fever/chills Eyes: No visual changes. ENT: No sore throat. Cardiovascular: Denies chest pain. Respiratory: Denies shortness of breath but is hypoxic. Gastrointestinal: No abdominal pain.  No nausea, no vomiting.  No diarrhea.  No constipation. Genitourinary: Negative for dysuria. Musculoskeletal: Negative for back pain. Skin: Negative for rash. Neurological: Negative for headaches, focal weakness   ____________________________________________   PHYSICAL EXAM:  VITAL SIGNS: ED Triage Vitals  Enc Vitals Group     BP 05/07/21 1436 103/74     Pulse Rate 05/07/21 1436 97     Resp 05/07/21 1436 (!) 36     Temp 05/07/21 1436 97.9 F (36.6 C)     Temp Source 05/07/21 1436 Oral     SpO2 05/07/21 1436 91 %     Weight 05/07/21 1439 128 lb 12 oz (58.4 kg)     Height 05/07/21 1439 5\' 6"  (1.676 m)     Head Circumference --      Peak Flow --      Pain Score 05/07/21 1438 5     Pain Loc --      Pain Edu? --      Excl. in [place]? --      Constitutional: Alert and oriented.  Chronically ill-appearing Eyes: Conjunctivae are normal.  Head: Atraumatic. Nose: No congestion/rhinnorhea. Mouth/Throat: Mucous membranes are moist.  Oropharynx non-erythematous. Neck: No stridor.   Cardiovascular: Normal rate, regular rhythm. Grossly normal heart sounds.  Good peripheral circulation. Respiratory: Normal respiratory effort.  No retractions. Lungs CTAB. Gastrointestinal: Soft and nontender. No distention. No abdominal bruits.  Musculoskeletal: No lower extremity tenderness nor edema.   Neurologic:  Normal speech and language. No gross focal neurologic deficits are appreciated.  Skin:  Skin is warm, dry and intact. No rash noted.   ____________________________________________   LABS (all labs ordered are listed, but only abnormal results are displayed)  Labs Reviewed  COMPREHENSIVE METABOLIC PANEL - Abnormal; Notable for the following components:      Result Value   Glucose, Bld 120 (*)    BUN 27 (*)    Creatinine, Ser 1.71 (*)    Calcium 8.7 (*)    Albumin 2.6 (*)    GFR, Estimated 43 (*)    All other components within normal limits  CBC WITH DIFFERENTIAL/PLATELET - Abnormal; Notable for the following components:   WBC 16.6 (*)    RBC 3.21 (*)    Hemoglobin 9.9 (*)    HCT 30.2 (*)    RDW 20.8 (*)    Neutro Abs 14.2 (*)    Lymphs Abs 0.2 (*)    Monocytes Absolute 1.8 (*)    Abs Immature Granulocytes 0.16 (*)    All other components within normal limits  PROTIME-INR - Abnormal; Notable for the following components:   Prothrombin Time 20.0 (*)    INR 1.7 (*)    All other components within normal limits  D-DIMER, QUANTITATIVE - Abnormal; Notable for the following components:   D-Dimer, Quant 1.70 (*)    All other components within normal limits  BRAIN NATRIURETIC PEPTIDE - Abnormal; Notable for the following components:   B Natriuretic Peptide 1,105.5 (*)    All other components within normal limits  CULTURE,  BLOOD (ROUTINE X 2)  CULTURE, BLOOD (ROUTINE X 2)  LACTIC ACID, PLASMA  URINALYSIS, COMPLETE (UACMP) WITH MICROSCOPIC  TROPONIN I (HIGH SENSITIVITY)   ____________________________________________  EKG  EKG read interpreted by me shows normal sinus rhythm regular 91 extreme rightward axis flipped T's in lead III and in V1 through V5.  The precordial leads are new since the fifth of this month. ____________________________________________  RADIOLOGY Gertha Calkin, personally viewed and evaluated these images (plain radiographs) as part of my medical decision making, as well as reviewing the written report by the radiologist.  ED MD interpretation: Chest x-ray reviewed by me shows a markedly worsened new left-sided infiltrate.  This could be pneumonia.  I will start him on hospital-acquired pneumonia antibiotics.  Official radiology report(s): No results found.  ____________________________________________   PROCEDURES  Procedure(s) performed (including Critical Care): Critical care time 20 minutes.  This includes reviewing the patient's old records and labs and of course reviewing all the chest x-ray and labs I did today along with the EKG and his old EKGs.  I have also signed him out to the oncoming physician.  Procedures   ____________________________________________   INITIAL IMPRESSION / ASSESSMENT AND PLAN / ED COURSE  As part of my medical decision making, I reviewed the following data within the [facility]  I have reviewed the patient's old records and old labs and EKGs from previous admission.  Additionally have reviewed his x-ray and EKG here see above for that.  His D-dimer is elevated so we will have to get a CT angio on him as he is a prime set up with his active cancer for PE especially since he is less mobile than usual.  Additionally he has a white count hypoxia and chest x-ray consistent with pneumonia.  I will have to sign him out to the  oncoming physician.  As it is almost 4:00 now.  His blood pressure is back down I will put sepsis  protocol and on him.  Begin antibiotics for his presumed pneumonia and put a procalcitonin in.             ____________________________________________   FINAL CLINICAL IMPRESSION(S) / ED DIAGNOSES  Final diagnoses:  Hypoxia  Dyspnea, unspecified type  Sepsis after obstetrical procedure     ED Discharge Orders     None        Note:  This document was prepared using Dragon voice recognition software and may include unintentional dictation errors.    Nena Polio, MD 05/07/21 [phone]

## 2021-05-07 NOTE — Consult Note (Signed)
Pharmacy Antibiotic Note  Jimmy West is a 70 y.o. male admitted on 05/07/2021 with pneumonia.  Pharmacy has been consulted for vancomycin and cefepime dosing.  Plan: Will order cefepime 2 g q12H   Will order vancomycin 1500 mg x 1. Pt's Scr is elevated from baseline. Will dose by levels and follow up with Scr in the AM.   Height: 5\' 6"  (167.6 cm) Weight: 58.4 kg (128 lb 12 oz) IBW/kg (Calculated) : 63.8  Temp (24hrs), Avg:97.9 F (36.6 C), Min:97.9 F (36.6 C), Max:97.9 F (36.6 C)  Recent Labs  Lab 05/07/21 1446  WBC 16.6*  CREATININE 1.71*  LATICACIDVEN 0.9    Estimated Creatinine Clearance: 33.2 mL/min (A) (by C-G formula based on SCr of 1.71 mg/dL (H)).    No Known Allergies  Antimicrobials this admission: 10/18 cefepime >>  10/18 vancomycin >>   Dose adjustments this admission: None  Microbiology results: 10/18 BCx: pending 10/18 MRSA PCR: ordered.   Thank you for allowing pharmacy to be a part of this patient's care.  Oswald Hillock 05/07/2021 7:24 PM

## 2021-05-08 ENCOUNTER — Inpatient Hospital Stay
Admit: 2021-05-08 | Discharge: 2021-05-08 | Disposition: A | Discharge status: Home / Self Care | Payer: Medicare HMO | Attending: Hospitalist | Admitting: Hospitalist

## 2021-05-08 DIAGNOSIS — J9601 Acute respiratory failure with hypoxia: Secondary | ICD-10-CM | POA: Diagnosis not present

## 2021-05-08 DIAGNOSIS — R0902 Hypoxemia: Secondary | ICD-10-CM

## 2021-05-08 LAB — TROPONIN I (HIGH SENSITIVITY)
Troponin I (High Sensitivity): 2927 ng/L (ref <18)
Troponin I (High Sensitivity): 2894 ng/L (ref <18)

## 2021-05-08 LAB — CBC
HCT: 28.5 % — ABNORMAL LOW (ref 39.0–52.0)
Hemoglobin: 9.4 g/dL — ABNORMAL LOW (ref 13.0–17.0)
MCH: 31.1 pg (ref 26.0–34.0)
MCHC: 33 g/dL (ref 30.0–36.0)
MCV: 94.4 fL (ref 80.0–100.0)
Platelets: 263 10*3/uL (ref 150–400)
RBC: 3.02 MIL/uL — ABNORMAL LOW (ref 4.22–5.81)
RDW: 20.8 % — ABNORMAL HIGH (ref 11.5–15.5)
WBC: 14.1 10*3/uL — ABNORMAL HIGH (ref 4.0–10.5)
nRBC: 0 % (ref 0.0–0.2)

## 2021-05-08 LAB — ECHOCARDIOGRAM COMPLETE
AR max vel: 3.37 cm2
AV Area VTI: 3.72 cm2
AV Area mean vel: 3.31 cm2
AV Mean grad: 4 mmHg
AV Peak grad: 7 mmHg
Ao pk vel: 1.32 m/s
Area-P 1/2: 8.43 cm2
Height: 66 in
MV VTI: 3.82 cm2
S' Lateral: 3.2 cm
Weight: 2059.98 oz

## 2021-05-08 LAB — BASIC METABOLIC PANEL
Anion gap: 12 (ref 5–15)
BUN: 22 mg/dL (ref 8–23)
CO2: 26 mmol/L (ref 22–32)
Calcium: 8.8 mg/dL — ABNORMAL LOW (ref 8.9–10.3)
Chloride: 103 mmol/L (ref 98–111)
Creatinine, Ser: 1.5 mg/dL — ABNORMAL HIGH (ref 0.61–1.24)
GFR, Estimated: 50 mL/min — ABNORMAL LOW (ref >60)
Glucose, Bld: 101 mg/dL — ABNORMAL HIGH (ref 70–99)
Potassium: 3.9 mmol/L (ref 3.5–5.1)
Sodium: 141 mmol/L (ref 135–145)

## 2021-05-08 LAB — HEPARIN LEVEL (UNFRACTIONATED): Heparin Unfractionated: >1.1 IU/mL — ABNORMAL HIGH (ref 0.30–0.70)

## 2021-05-08 LAB — MAGNESIUM: Magnesium: 1.8 mg/dL (ref 1.7–2.4)

## 2021-05-08 LAB — APTT
aPTT: 48 seconds — ABNORMAL HIGH (ref 24–36)
aPTT: 95 seconds — ABNORMAL HIGH (ref 24–36)

## 2021-05-08 MED ORDER — VANCOMYCIN HCL 750 MG/150ML IV SOLN
750.0000 mg | INTRAVENOUS | Status: DC
  Administered 2021-05-08: 750 mg via INTRAVENOUS
  Filled 2021-05-08 (×2): qty 150

## 2021-05-08 MED ORDER — HEPARIN BOLUS VIA INFUSION
1750.0000 [IU] | Freq: Once | INTRAVENOUS | Status: AC
  Administered 2021-05-08: 1750 [IU] via INTRAVENOUS
  Filled 2021-05-08: qty 1750

## 2021-05-08 MED ORDER — BUDESONIDE 0.5 MG/2ML IN SUSP
0.5000 mg | Freq: Two times a day (BID) | RESPIRATORY_TRACT | Status: DC
  Administered 2021-05-08 – 2021-06-12 (×69): 0.5 mg via RESPIRATORY_TRACT
  Filled 2021-05-08 (×70): qty 2

## 2021-05-08 MED ORDER — METHYLPREDNISOLONE SODIUM SUCC 40 MG IJ SOLR
40.0000 mg | Freq: Two times a day (BID) | INTRAMUSCULAR | Status: DC
  Administered 2021-05-08 – 2021-05-13 (×10): 40 mg via INTRAVENOUS
  Filled 2021-05-08 (×10): qty 1

## 2021-05-08 MED ORDER — IPRATROPIUM-ALBUTEROL 0.5-2.5 (3) MG/3ML IN SOLN
3.0000 mL | RESPIRATORY_TRACT | Status: DC
  Administered 2021-05-08 – 2021-05-14 (×32): 3 mL via RESPIRATORY_TRACT
  Filled 2021-05-08 (×31): qty 3

## 2021-05-08 MED ORDER — FUROSEMIDE 10 MG/ML IJ SOLN
20.0000 mg | Freq: Every day | INTRAMUSCULAR | Status: DC
  Administered 2021-05-09: 20 mg via INTRAVENOUS
  Filled 2021-05-08: qty 4

## 2021-05-08 NOTE — ED Notes (Signed)
RN placed pt on 8l/min via high flow nasal canula with humidifier. Provider notified

## 2021-05-08 NOTE — Consult Note (Signed)
ANTICOAGULATION CONSULT NOTE  Pharmacy Consult for heparin Indication: chest pain/ACS  No Known Allergies  Patient Measurements: Height: 5\' 6"  (167.6 cm) Weight: 58.4 kg (128 lb 12 oz) IBW/kg (Calculated) : 63.8 Heparin Dosing Weight: 58.4kg  Vital Signs: BP: 103/63 (10/19 1400) Pulse Rate: 104 (10/19 1415)  Labs: Recent Labs    05/07/21 1446 05/07/21 1714 05/08/21 0320 05/08/21 0552 05/08/21 1659  HGB 9.9*  --   --  9.4*  --   HCT 30.2*  --   --  28.5*  --   PLT 275  --   --  263  --   APTT  --  50*  --  48* 95*  LABPROT 20.0*  --   --   --   --   INR 1.7*  --   --   --   --   HEPARINUNFRC  --   --   --  >1.10*  --   CREATININE 1.71*  --   --  1.50*  --   TROPONINIHS 3,153* 3,506* 2,927* 2,894*  --      Estimated Creatinine Clearance: 37.9 mL/min (A) (by C-G formula based on SCr of 1.5 mg/dL (H)).   Medical History: Past Medical History:  Diagnosis Date   Bulging lumbar disc    Cancer ([place])    stage 4 lung cancer   Heart attack ([place])     Medications:  Scheduled:   budesonide (PULMICORT) nebulizer solution  0.5 mg Nebulization BID   feeding supplement  237 mL Oral TID BM   FLUoxetine  40 mg Oral Daily   ipratropium-albuterol  3 mL Nebulization Q4H   methylPREDNISolone (SOLU-MEDROL) injection  40 mg Intravenous Q12H   OLANZapine  10 mg Oral QHS   vancomycin variable dose per unstable renal function (pharmacist dosing)   Does not apply See admin instructions    Assessment: 70yo M with PMH of 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 Troponin's were found to be elevated. Pharmacy was consulted for heparin dosing.   Apixaban noted on PTA med rec  Goal of Therapy:  Heparin level 0.3-0.7 units/ml aPTT 66-102 seconds Monitor platelets by  anticoagulation protocol: Yes   Plan:  aPTT is therapeutic: continue heparin infusion at 700 units/hr recheck aPTT in 8 hours  Will check daily anti-Xa levels and switch to monitoring when correlating to aPTT Continue to monitor H&H and platelets with daily CBC while on heparin ggt   Dallie Piles 05/08/2021,5:44 PM

## 2021-05-08 NOTE — Progress Notes (Signed)
PROGRESS NOTE    Jimmy West  URK:[id number] DOB: [birth year] DOA: 05/07/2021 PCP: Pcp, No    Brief Narrative:  This 70 years old male with PMH significant for right lower lobe stage IV adenocarcinoma of lung, chronic anemia secondary to antineoplastic therapy, anxiety, depression, presence of Port-A-Cath, CAD s/p PCI with a stent placement in 2005, tobacco abuse, quit smoking in 2005 presented in the ED with complaints of generalized weakness and shortness of breath.  Patient was recently discharged home on 04/28/2021 on 2 L of oxygen after he was treated for aspiration pneumonia.  Work-up in the ED shows chest x-ray new left lower lobe opacity consistent with pneumonia.  CTA chest negative for PE.  Elevated troponins consistent with NSTEMI. Patient is started on IV antibiotics,  vancomycin and cefepime and is started on heparin gtt. Cardiology and pulmonology consulted.   Assessment & Plan:   Active Problems:   Acute hypoxemic respiratory failure (HCC)  Acute on chronic hypoxic respiratory failure sec. to LLL PNA: Patient uses 2 L of oxygen at home. Presented with hypoxia with SPO2 80% on room air. Oxygen requirement increased to 8 L. Chest x-ray shows left lower lobe pneumonia. Procalcitonin elevated 1.38 with new leukocytosis. Continue vancomycin and cefepime, recently treated for aspiration pneumonia with IV Unasyn and then Augmentin. Continue supplemental oxygen to keep saturation above 92% Continue DuoNeb every 6 hours, continue Solu-Medrol.  Severe sepsis POA sec.to PNA: Presented with tachycardia, tachypnea, leukocytosis, AKI with source pneumonia Continue IV antibiotics, follow blood cultures.  Possible NSTEMI: Troponin 3153 > 3506. Patient denies any chest pain.  EKG no ischemic changes Continue heparin gtt. for now Continue to trend troponin, echocardiogram. Cardiology consulted.  Awaiting recommendation.   CAD s/p stent in 2005: Patient is not on any  cardioprotective medications at home.   AKI: Baseline serum creatinine 1.30, presented with 1.71 Could be due to poor hydration,  Continue IV hydration.  Hypotension: Not on blood pressure medications at home. Blood pressure seems like at baseline. Continue IV hydration.   Stage IV lung cancer: Patient has received radiation and chemotherapy previously Not on any due to poor clinical and functional status.  Chronic anemia: Due to antineoplastic therapy and chronic disease Hemoglobin is stable  Chronic pain syndrome: Continue oxycodone and bowel regimen as needed   Anxiety and depression: continue Prozac Zyprexa    DVT prophylaxis: Heparin GTT Code Status: DNR Family Communication: No family at bedside Disposition Plan:   Status is: Inpatient  Remains inpatient appropriate because: IV antibiotics for pneumonia and IV heparin for non-STEMI.  Anticipated discharge home in few days once medically cleared.   Consultants:  Cardiology, pulmonology  Procedures: None Antimicrobials:   Anti-infectives (From admission, onward)    Start     Dose/Rate Route Frequency Ordered Stop   05/08/21 1800  vancomycin (VANCOREADY) IVPB 750 mg/150 mL        750 mg 150 mL/hr over 60 Minutes Intravenous Every 24 hours 05/08/21 1135     05/08/21 0800  ceFEPIme (MAXIPIME) 2 g in sodium chloride 0.9 % 100 mL IVPB        2 g 200 mL/hr over 30 Minutes Intravenous Every 12 hours 05/07/21 1929     05/07/21 2200  ceFEPIme (MAXIPIME) 2 g in sodium chloride 0.9 % 100 mL IVPB  Status:  Discontinued        2 g 200 mL/hr over 30 Minutes Intravenous Every 12 hours 05/07/21 1927 05/07/21 1929   05/07/21 1930  vancomycin (  VANCOREADY) IVPB 500 mg/100 mL        500 mg 100 mL/hr over 60 Minutes Intravenous  Once 05/07/21 1927 05/07/21 2144   05/07/21 1927  vancomycin variable dose per unstable renal function (pharmacist dosing)         Does not apply See admin instructions 05/07/21 1927     05/07/21  1600  vancomycin (VANCOCIN) IVPB 1000 mg/200 mL premix        1,000 mg 200 mL/hr over 60 Minutes Intravenous  Once 05/07/21 1552 05/07/21 1801   05/07/21 1600  ceFEPIme (MAXIPIME) 2 g in sodium chloride 0.9 % 100 mL IVPB        2 g 200 mL/hr over 30 Minutes Intravenous  Once 05/07/21 1552 05/07/21 1700        Subjective: Patient was seen and examined at bedside.  Overnight events noted.   Patient remains significantly short of breath on 8 L of supplemental oxygen.  Objective: Vitals:   05/08/21 1330 05/08/21 1345 05/08/21 1400 05/08/21 1415  BP: 103/74  103/63   Pulse: 100 (!) 103 100 (!) 104  Resp: (!) 31 (!) 31 (!) 22 (!) 23  Temp:      TempSrc:      SpO2: 91% 93% 99% 99%  Weight:      Height:        Intake/Output Summary (Last 24 hours) at 05/08/2021 1644 Last data filed at 05/08/2021 0926 Gross per 24 hour  Intake 194.19 ml  Output --  Net 194.19 ml   Filed Weights   05/07/21 1439  Weight: 58.4 kg    Examination:  General exam: Appears tachypneic, SPO2 90% on 8 L/M, via nasal cannula. Respiratory system: Clear to auscultation with reduced breath sounds bilaterally.  RR 19 Cardiovascular system: S1-S2 heard, regular rate and rhythm, no murmur. Gastrointestinal system: Abdomen is soft, nontender, nondistended, BS+ Central nervous system: Alert and oriented. No focal neurological deficits. Extremities: No edema, no cyanosis, no clubbing. Skin: No rashes, lesions or ulcers Psychiatry: Judgement and insight appear normal. Mood & affect appropriate.     Data Reviewed: I have personally reviewed following labs and imaging studies  CBC: Recent Labs  Lab 05/07/21 1446 05/08/21 0552  WBC 16.6* 14.1*  NEUTROABS 14.2*  --   HGB 9.9* 9.4*  HCT 30.2* 28.5*  MCV 94.1 94.4  PLT 275 664   Basic Metabolic Panel: Recent Labs  Lab 05/07/21 1446 05/08/21 0552  NA 139 141  K 4.1 3.9  CL 101 103  CO2 27 26  GLUCOSE 120* 101*  BUN 27* 22  CREATININE 1.71*  1.50*  CALCIUM 8.7* 8.8*  MG  --  1.8   GFR: Estimated Creatinine Clearance: 37.9 mL/min (A) (by C-G formula based on SCr of 1.5 mg/dL (H)). Liver Function Tests: Recent Labs  Lab 05/07/21 1446  AST 22  ALT 15  ALKPHOS 80  BILITOT 0.9  PROT 6.5  ALBUMIN 2.6*   No results for input(s): LIPASE, AMYLASE in the last 168 hours. No results for input(s): AMMONIA in the last 168 hours. Coagulation Profile: Recent Labs  Lab 05/07/21 1446  INR 1.7*   Cardiac Enzymes: No results for input(s): CKTOTAL, CKMB, CKMBINDEX, TROPONINI in the last 168 hours. BNP (last 3 results) No results for input(s): PROBNP in the last 8760 hours. HbA1C: No results for input(s): HGBA1C in the last 72 hours. CBG: No results for input(s): GLUCAP in the last 168 hours. Lipid Profile: No results for input(s): CHOL, HDL, LDLCALC,  TRIG, CHOLHDL, LDLDIRECT in the last 72 hours. Thyroid Function Tests: No results for input(s): TSH, T4TOTAL, FREET4, T3FREE, THYROIDAB in the last 72 hours. Anemia Panel: No results for input(s): VITAMINB12, FOLATE, FERRITIN, TIBC, IRON, RETICCTPCT in the last 72 hours. Sepsis Labs: Recent Labs  Lab 05/07/21 1446  PROCALCITON 1.38  LATICACIDVEN 0.9    Recent Results (from the past 240 hour(s))  Culture, blood (Routine x 2)     Status: None (Preliminary result)   Collection Time: 05/07/21  2:46 PM   Specimen: BLOOD  Result Value Ref Range Status   Specimen Description BLOOD PORTA CATH  Final   Special Requests   Final    BOTTLES DRAWN AEROBIC AND ANAEROBIC Blood Culture results may not be optimal due to an excessive volume of blood received in culture bottles   Culture   Final    NO GROWTH < 24 HOURS Performed at [facility], [address], [place], [place] [address]    Report Status PENDING  Incomplete  Culture, blood (Routine x 2)     Status: None (Preliminary result)   Collection Time: 05/07/21  2:46 PM   Specimen: BLOOD  Result Value Ref Range Status    Specimen Description BLOOD RIGHT ANTECUBITAL  Final   Special Requests   Final    BOTTLES DRAWN AEROBIC AND ANAEROBIC Blood Culture results may not be optimal due to an excessive volume of blood received in culture bottles   Culture   Final    NO GROWTH < 24 HOURS Performed at [facility], [address] [place],  [address]    Report Status PENDING  Incomplete  Resp Panel by RT-PCR (Flu A&B, Covid) Nasopharyngeal Swab     Status: None   Collection Time: 05/07/21  5:57 PM   Specimen: Nasopharyngeal Swab; Nasopharyngeal(NP) swabs in vial transport medium  Result Value Ref Range Status   SARS Coronavirus 2 by RT PCR NEGATIVE NEGATIVE Final    Comment: (NOTE) SARS-CoV-2 target nucleic acids are NOT DETECTED.  The SARS-CoV-2 RNA is generally detectable in upper respiratory specimens during the acute phase of infection. The lowest concentration of SARS-CoV-2 viral copies this assay can detect is 138 copies/mL. A negative result does not preclude SARS-Cov-2 infection and should not be used as the sole basis for treatment or other patient management decisions. A negative result may occur with  improper specimen collection/handling, submission of specimen other than nasopharyngeal swab, presence of viral mutation(s) within the areas targeted by this assay, and inadequate number of viral copies(<138 copies/mL). A negative result must be combined with clinical observations, patient history, and epidemiological information. The expected result is Negative.  Fact Sheet for Patients:  EntrepreneurPulse.com.au  Fact Sheet for Healthcare Providers:  IncredibleEmployment.be  This test is no t yet approved or cleared by the [place] FDA and  has been authorized for detection and/or diagnosis of SARS-CoV-2 by FDA under an Emergency Use Authorization (EUA). This EUA will remain  in effect (meaning this test can be used) for the duration  of the COVID-19 declaration under Section 564(b)(1) of the Act, 21 U.S.C.section 360bbb-3(b)(1), unless the authorization is terminated  or revoked sooner.       Influenza A by PCR NEGATIVE NEGATIVE Final   Influenza B by PCR NEGATIVE NEGATIVE Final    Comment: (NOTE) The Xpert Xpress SARS-CoV-2/FLU/RSV plus assay is intended as an aid in the diagnosis of influenza from Nasopharyngeal swab specimens and should not be used as a sole basis  for treatment. Nasal washings and aspirates are unacceptable for Xpert Xpress SARS-CoV-2/FLU/RSV testing.  Fact Sheet for Patients: EntrepreneurPulse.com.au  Fact Sheet for Healthcare Providers: IncredibleEmployment.be  This test is not yet approved or cleared by the [place] FDA and has been authorized for detection and/or diagnosis of SARS-CoV-2 by FDA under an Emergency Use Authorization (EUA). This EUA will remain in effect (meaning this test can be used) for the duration of the COVID-19 declaration under Section 564(b)(1) of the Act, 21 U.S.C. section 360bbb-3(b)(1), unless the authorization is terminated or revoked.  Performed at [facility], [address] [place], [place] [address]    Radiology Studies: CT Angio Chest PE W and/or Wo Contrast  Result Date: 05/07/2021 CLINICAL DATA:  Weakness and cough. History of metastatic lung cancer. EXAM: CT ANGIOGRAPHY CHEST WITH CONTRAST TECHNIQUE: Multidetector CT imaging of the chest was performed using the standard protocol during bolus administration of intravenous contrast. Multiplanar CT image reconstructions and MIPs were obtained to evaluate the vascular anatomy. CONTRAST:  4mL OMNIPAQUE IOHEXOL 350 MG/ML SOLN COMPARISON:  11/02/2019 FINDINGS: Cardiovascular: The heart is mildly enlarged but stable. No pericardial effusion. Stable tortuosity and calcification of the thoracic aorta but no aneurysm or dissection. The branch vessels are  patent. Three-vessel coronary artery calcifications are noted. The right IJ power port is in unchanged position. No complicating features. The pulmonary arterial tree is well opacified. No filling defects to suggest pulmonary embolism. Mediastinum/Nodes: No mediastinal or hilar mass or lymphadenopathy the. The esophagus is grossly. Lungs/Pleura: Stable underlying advanced emphysematous changes and areas of pulmonary scarring. Right lower lobe lung mass measures approximately 2.8 x 2.2 cm and previously measured 3.3 x 3.2 cm. Some surrounding interstitial scarring changes likely related to radiation. Peripheral right upper lobe lesion on image number 29/6 is stable measuring a maximum of 13 mm. Diffuse interstitial and airspace process in both lungs is most likely an infectious process. Aspiration would be a consideration. There is also a small right pleural effusion. Upper Abdomen: Few tiny low-attenuation hepatic lesions are stable. Stable splenomegaly. Stable right renal cyst. Musculoskeletal: No chest wall mass, supraclavicular or axillary adenopathy. The bony thorax is intact. No worrisome bone lesions. Stable severe compression deformity of T4 with vertebral plana appearance. Review of the MIP images confirms the above findings. IMPRESSION: 1. No CT findings for pulmonary embolism. 2. Stable tortuosity and calcification of the thoracic aorta but no dissection. 3. Interval decrease in size of the right lower lobe lung mass. The right upper lobe peripheral pulmonary nodule is stable. 4. Interstitial and airspace process in both lungs is likely an infectious process. Aspiration would be a consideration. Aortic Atherosclerosis (ICD10-I70.0) and Emphysema (ICD10-J43.9). Electronically Signed   By: Marijo Sanes M.D.   On: 05/07/2021 16:56   DG Chest Port 1 View  Result Date: 05/07/2021 CLINICAL DATA:  Sepsis.  History of right lower lobe lung cancer. EXAM: PORTABLE CHEST 1 VIEW COMPARISON:  Chest x-ray  04/24/2021.  PET-CT 01/03/2021 FINDINGS: The right IJ power port is in stable position. The cardiac silhouette, mediastinal and hilar contours are stable. Extensive opacity in the right lower lobe most likely radiation fibrosis. New left lower lobe airspace process most consistent with pneumonia. No definite pleural effusions or pneumothorax. IMPRESSION: New left lower lobe airspace process most consistent with pneumonia. Extensive right lower lobe density likely radiation change. Electronically Signed   By: Marijo Sanes M.D.   On: 05/07/2021 16:06   ECHOCARDIOGRAM COMPLETE  Result Date: 05/08/2021  ECHOCARDIOGRAM REPORT   Patient Name:   XADRIAN CRAIGHEAD Date of Exam: 05/08/2021 Medical Rec #:  [id number]            Height:       66.0 in Accession #:    [id number]           Weight:       128.7 lb Date of Birth:  14-Sep-1950           BSA:          1.658 m Patient Age:    8 years             BP:           97/66 mmHg Patient Gender: M                    HR:           99 bpm. Exam Location:  [facility] Procedure: 2D Echo, Color Doppler and Cardiac Doppler Indications:     Elevated troponin  History:         Patient has no prior history of Echocardiogram examinations.                  Stage 4 lung cancer.  Sonographer:     Charmayne Sheer Referring Phys:  [id number] [place] Diagnosing Phys: Serafina Royals MD  Sonographer Comments: No subcostal window and suboptimal apical window. IMPRESSIONS  1. Left ventricular ejection fraction, by estimation, is 55 to 60%. The left ventricle has normal function. The left ventricle has no regional wall motion abnormalities. Left ventricular diastolic parameters are consistent with Grade I diastolic dysfunction (impaired relaxation).  2. Right ventricular systolic function is normal. The right ventricular size is normal.  3. The mitral valve is normal in structure. Mild mitral valve regurgitation.  4. The aortic valve is normal in structure. Aortic valve regurgitation is mild. FINDINGS   Left Ventricle: Left ventricular ejection fraction, by estimation, is 55 to 60%. The left ventricle has normal function. The left ventricle has no regional wall motion abnormalities. The left ventricular internal cavity size was normal in size. There is  no left ventricular hypertrophy. Left ventricular diastolic parameters are consistent with Grade I diastolic dysfunction (impaired relaxation). Right Ventricle: The right ventricular size is normal. No increase in right ventricular wall thickness. Right ventricular systolic function is normal. Left Atrium: Left atrial size was normal in size. Right Atrium: Right atrial size was normal in size. Pericardium: There is no evidence of pericardial effusion. Mitral Valve: The mitral valve is normal in structure. Mild mitral valve regurgitation. MV peak gradient, 4.3 mmHg. The mean mitral valve gradient is 2.0 mmHg. Tricuspid Valve: The tricuspid valve is normal in structure. Tricuspid valve regurgitation is mild. Aortic Valve: The aortic valve is normal in structure. Aortic valve regurgitation is mild. Aortic valve mean gradient measures 4.0 mmHg. Aortic valve peak gradient measures 7.0 mmHg. Aortic valve area, by VTI measures 3.72 cm. Pulmonic Valve: The pulmonic valve was normal in structure. Pulmonic valve regurgitation is not visualized. Aorta: The aortic root and ascending aorta are structurally normal, with no evidence of dilitation. IAS/Shunts: No atrial level shunt detected by color flow Doppler.  LEFT VENTRICLE PLAX 2D LVIDd:         4.80 cm   Diastology LVIDs:         3.20 cm   LV e' medial:    4.68 cm/s LV PW:  0.90 cm   LV E/e' medial:  14.5 LV IVS:        0.70 cm   LV e' lateral:   4.68 cm/s LVOT diam:     2.20 cm   LV E/e' lateral: 14.5 LV SV:         68 LV SV Index:   41 LVOT Area:     3.80 cm  LEFT ATRIUM             Index LA diam:        3.10 cm 1.87 cm/m LA Vol (A2C):   31.1 ml 18.75 ml/m LA Vol (A4C):   34.6 ml 20.86 ml/m LA Biplane Vol:  34.2 ml 20.62 ml/m  AORTIC VALVE                    PULMONIC VALVE AV Area (Vmax):    3.37 cm     PV Vmax:       1.13 m/s AV Area (Vmean):   3.31 cm     PV Vmean:      72.900 cm/s AV Area (VTI):     3.72 cm     PV VTI:        0.180 m AV Vmax:           132.00 cm/s  PV Peak grad:  5.1 mmHg AV Vmean:          90.300 cm/s  PV Mean grad:  3.0 mmHg AV VTI:            0.183 m AV Peak Grad:      7.0 mmHg AV Mean Grad:      4.0 mmHg LVOT Vmax:         117.00 cm/s LVOT Vmean:        78.700 cm/s LVOT VTI:          0.179 m LVOT/AV VTI ratio: 0.98  AORTA Ao Root diam: 3.50 cm MITRAL VALVE MV Area (PHT): 8.43 cm    SHUNTS MV Area VTI:   3.82 cm    Systemic VTI:  0.18 m MV Peak grad:  4.3 mmHg    Systemic Diam: 2.20 cm MV Mean grad:  2.0 mmHg MV Vmax:       1.04 m/s MV Vmean:      67.3 cm/s MV Decel Time: 90 msec MV E velocity: 67.70 cm/s MV A velocity: 96.40 cm/s MV E/A ratio:  0.70 Serafina Royals MD Electronically signed by Serafina Royals MD Signature Date/Time: 05/08/2021/1:34:35 PM    Final     Scheduled Meds:  budesonide (PULMICORT) nebulizer solution  0.5 mg Nebulization BID   feeding supplement  237 mL Oral TID BM   FLUoxetine  40 mg Oral Daily   ipratropium-albuterol  3 mL Nebulization Q4H   methylPREDNISolone (SOLU-MEDROL) injection  40 mg Intravenous Q12H   OLANZapine  10 mg Oral QHS   vancomycin variable dose per unstable renal function (pharmacist dosing)   Does not apply See admin instructions   Continuous Infusions:  ceFEPime (MAXIPIME) IV Stopped (05/08/21 0926)   heparin 900 Units/hr (05/08/21 0814)   vancomycin       LOS: 1 day    Time spent: 35 mins    Hasna Stefanik, MD [facility]   If 7PM-7AM, please contact night-coverage

## 2021-05-08 NOTE — Progress Notes (Signed)
*  PRELIMINARY RESULTS* Echocardiogram 2D Echocardiogram has been performed.  Jimmy West 05/08/2021, 10:29 AM

## 2021-05-08 NOTE — Progress Notes (Signed)
[facility] ED26A [facility] Hospital Liaison note:  This patient is currently enrolled in [facility] outpatient-based Palliative Care. Will continue to follow for disposition.  Please call with any outpatient palliative questions or concerns.  Thank you, Dee Curry, LPN [facility] Hospital Liaison [phone] 

## 2021-05-08 NOTE — Consult Note (Signed)
Pharmacy Antibiotic Note  Jimmy West is a 70 y.o. 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  Pharmacy has been consulted for vancomycin and cefepime dosing.  Afeb, WBC 16.6>14.1  Bcx NG <24h, MRSA PCR ordered   Plan: Cefepime -Will continue cefepime 2g IV q12H   Vancomycin -Pt received vancomycin 1500mg  IV x 1 in ED -Will start vancomycin 750mg  IV q24h    -Est AUC: 498    -Css min: 13.6    -Scr used: 1.5    -Wt used: TBW    -Vd: 0.72 -Will obtain levels around 4th or 5th dose if vancomycin continued   Will continue to monitor renal function and adjust dose as clinically indicated   Height: 5\' 6"  (167.6 cm) Weight: 58.4 kg (128 lb 12 oz) IBW/kg (Calculated) : 63.8  Temp (24hrs), Avg:97.9 F (36.6 C), Min:97.9 F (36.6 C), Max:97.9 F (36.6 C)  Recent Labs  Lab 05/07/21 1446 05/08/21 0552  WBC 16.6* 14.1*  CREATININE 1.71* 1.50*  LATICACIDVEN 0.9  --      Estimated Creatinine Clearance: 37.9 mL/min (A) (by C-G formula based on SCr of 1.5 mg/dL (H)).    No Known Allergies  Antimicrobials this admission: 10/18 cefepime >>  10/18 vancomycin >>   Dose adjustments this admission: Vancomycin dosing by levels> 750mg  IV q24h  Microbiology results: 10/18 BCx: NG <24h 10/18 MRSA PCR: ordered.   Thank you for allowing pharmacy to be a part of this patient's care.  Narda Rutherford, PharmD Pharmacy Resident  05/08/2021 11:47 AM

## 2021-05-08 NOTE — Consult Note (Signed)
NAME:  Jimmy West, MRN:  [id number], DOB:  [birth year], LOS: 1 ADMISSION DATE:  05/07/2021 CONSULTATION DATE:  05/08/2021 REFERRING MD:  Dr Billie Ruddy CHIEF COMPLAINT:   Shortness of breath  70 yo male PMH stage IV lung ca with mets, DC from hospital on 04/28/21 after treatment of pneumonia, chronic hypoxic resp failure on 2lpm home O2. He presented via EMS to ER 10/18pm with cc SOB and cough, noted hypoxic 80% on room air, hypotensive SBP 90s. CXR concerning for continued pneumonia with sepsis, NSTEMI, BNP elevation. Requiring 8lpm via highflow cannula now with increased WOB. Patient is DNR/DNI; okay with short-term BiPAP trial if needed.   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 Patient confirms DNR/DNI code status today and was followed by palliative care team during previous admission. He has one living brother who lives in [place] and has not spoken to him in years. His preferred contact person is [name] (information in patient records). Patient lives alone and does not have assistance at home.   Patient presented to the ER via EMS last night 10/18 with chief complaint of shortness of breath. Patient reports that he has not felt well since his hospital discharge with profound weakness, too weak to eat, and worsening shortness of breath. He reports that he started wearing his home oxygen all of the time due to not feeling well which helped his pulmonary symptoms some. He states he was not using his prescribed inhaler because the pump was  malfunctioning on the handheld device. He also describes wheezing and dry, nonproductive cough which have worsened in the last several days. He denies recent fever, chills, sweating, nausea, vomiting, swelling. He reports small volume loose stools occasionally which is not his routine bowel movements.   On arrival to the ER, patient with noted oxygen saturations in the 80s on room air by EMS with blood pressure systolic in the 76L after a 200cc fluid bolus. Vital signs on arrival pulse 97, BP 103/74, RR 36, oxygen saturations 91% on 6L, afebrile. Patient noted to easily desaturate with activity, speaking, and any interruption to his oxygen flow such as when he was transitioned to 8L high flow nasal cannula with humidifier. Labs noted WBC 16.6, hgb 9.9, Creatinine 1.71, BNP 1105, troponin 3153 and 3506 on repeat. COVID and flu screening negative. Imaging: CXR findings of new left lower lobe airspace process suspicious for pneumonia with extensive right lower lobe density consistent with radiation change. D-dimer was elevated, and CTA of the chest was done which was negative for PE. Patient had sepsis bundle initiated and received 2 liters LR fluid resuscitation, empiric vancomycin and cefepime in the ER. He was started on a heparin continuous infusion for NSTEMI suspected demand ischemia on history of CAD with remote PCI.   PCCM was consulted to assess patient. He is currently being seen in the ER pending bed placement inpatient, tolerating 8 liters highflow oxygen cannula at oxygen saturations 92-93%; however, he has noted increased work of breathing with accessory muscles of breathing use, shortness of breath worsening with conversation with short shallow breaths between words. Patient is  alert, oriented to self, location, situation and time. He denies any other recent events other than what is described above. He has never used a BiPAP or other NIPPV support device before but is agreeable to trial on BiPAP for  a short duration if his respiratory condition worsens. He confirms DNI status as part of DNR and that he would not want intubation or prolonged machine support for his breathing.   Pertinent  Medical History   Past Medical History:  Diagnosis Date   Bulging lumbar disc    Cancer ([place])    stage 4 lung cancer   Heart attack [facility])   Remote history of PE on Eliquis CAD hx PCI 2005 Chronic anemia secondary to cancer treatment Port-a-cath right chest Recently discharged 04/28/21 after treatment of aspiration pneumonia  Significant Hospital Events: Including procedures, antibiotic start and stop dates in addition to other pertinent events   10/18: Brought by EMS to ER with cc: sob, hypoxic sats 80% on RA and hypotensive. CXR concerning for LUL pna, sepsis with NSTEMI demand ischemia on workup. Started on cefepime, vancomycin, heparin continuous infusion and assigned to hospitalist service pending bed placement.  10/19: Evaluated by PCCM significant pulm history with recurrent pneumonia ? Aspiration versus HCAP, sepsis, NSTEMI, hypotension. Stable sats on 8lpm highflow oxygen, with increased WOB and accessory muscle use.   Micro Data:  10/18 Resp viral COVID, Influenza A/B >> negative 10/18 Blood culture >> ngtd  Antimicrobials:  10/18 cefepime >>  10/18 vancomycin >>   Interim History / Subjective:  Patient seen sitting up in ER bed, short of breath + wheezing. Denies chest pain, palpitations. Intermittent dry, non productive cough chronic to patient. Denies abdominal pain, nausea, vomiting. Endorses poor appetite. Denies swelling or leg pain. Denies fevers, chills, sweating.   Started steroids and NEBS  Objective   Blood pressure 103/63, pulse (!) 104, temperature 97.9 F (36.6 C), temperature source Oral, resp. rate (!) 23, height 5\' 6"  (1.676 m), weight 58.4 kg, SpO2 99 %.     Intake/Output Summary (Last 24 hours) at 05/08/2021 1453 Last data filed at 05/08/2021 0926 Gross per 24  hour  Intake 194.19 ml  Output --  Net 194.19 ml   Filed Weights   05/07/21 1439  Weight: 58.4 kg    REVIEW OF SYSTEMS Review of Systems  Constitutional:  Positive for malaise/fatigue. Negative for chills, diaphoresis, fever and weight loss.  HENT: Negative.    Respiratory:  Positive for cough, shortness of breath and wheezing. Negative for hemoptysis and sputum production.   Cardiovascular:  Positive for orthopnea. Negative for chest pain, palpitations and leg swelling.  Gastrointestinal:  Negative for abdominal pain, blood in stool, constipation, diarrhea, nausea and vomiting.  Genitourinary: Negative.   Skin: Negative.   Neurological:  Positive for weakness. Negative for seizures and headaches.   PHYSICAL EXAMINATION: GENERAL: ill appearing, +resp distress +increased WOB  EYES: Pupils equal, round, reactive to light.  No scleral icterus.  MOUTH: Moist mucosal membrane.  NECK: Supple.  PULMONARY: +rhonchi, +wheezing inspiratory and expiratory diffuse CARDIOVASCULAR: S1 and S2.  No murmurs  GASTROINTESTINAL: Soft, nontender, non-distended. Positive bowel sounds.  MUSCULOSKELETAL: No swelling, clubbing, or edema.  NEUROLOGIC: alert, oriented to person, place, time, situation. No new weakness or neuro deficits SKIN: intact,warm,dry   Labs/imaging that I havepersonally reviewed  (right click and "Reselect all SmartList Selections" daily)  10/18 CXR>>New left lower lobe airspace process most consistent with pneumonia. Extensive right lower lobe density likely radiation change. 10/18 CTA  Chest>>no evidence of PE. Interval decrease in size of the right lower lobe lung mass. The right upper lobe peripheral pulmonary nodule is stable. Interstitial and airspace process in both lungs  Resolved Hospital Problem list      ASSESSMENT AND PLAN  70 yo male PMH stage IV lung ca with mets, DC from hospital on 04/28/21 after treatment of pneumonia, chronic hypoxic resp failure on 2lpm  home O2. He presented via EMS to ER 10/18pm with cc SOB and cough, noted hypoxic 80% on room air, hypotensive SBP 90s. CXR concerning for continued pneumonia with sepsis, NSTEMI, BNP elevation. Requiring 8lpm via highflow cannula now with increased WOB. Patient is DNR/DNI; okay with short-term BiPAP trial if needed.   Severe ACUTE on chronic Hypoxic Respiratory Failure NEW left upper lobe pneumonia - Aspiration versus HCAP Recent hospitalization for aspiration pneumonia (DC 04/28/21) Stage IV adenocarcinoma of the lung with mets - Oxygen supplementation to maintain O2 sats > 88% - Highflow support - Consider BiPAP support - Continue Bronchodilator Therapy BID - Continue duonebs q4h - Continue Solumedrol 40mg  IV q12h - MRSA screen not resulted - Follow procalcitonin - Patient is DNR/DNI confirmed by patient at bedside 10/18  Severe Sepsis without septic shock - secondary to new LUL pneumonia - IV fluid resuscitation  - Maintain MAP > 60 - Follow blood cultures; send sputum culture if productive cough - Empiric antibiotics ordered: cefepime, vancomycin - Continue stress dose steroids  CARDIAC  Hypotension, suspect baseline Elevated BNP (1105) - ? Heart failure Echo 10/19 reviewed>>LVEF 78-93%, grade I diastolic dysfunction, mild MV and AV regurgitation Elevated troponin - NSTEMI suspect demand ischemia CAD hx PCI 2005 - Continuous heparin infusion per protocol - Trend troponin - Oxygen support - Maintain MAP > 60 - Cardiology consult pending  ACUTE KIDNEY INJURY/Renal Failure - s/p fluid resuscitation 2L LR - Avoid nephrotoxic agents - Follow urine output, BMP - Ensure adequate renal perfusion, optimize oxygenation  Intake/Output Summary (Last 24 hours) at 05/08/2021 1453 Last data filed at 05/08/2021 8101 Gross per 24 hour  Intake 194.19 ml  Output --  Net 194.19 ml   Anemia, chronic secondary to cancer treatment - stable - Monitor routine CBC  Elevated d-dimer   History PE on Eliquis CTA Chest 10/18 negative for PE - Heparin continuous infusion ongoing for NSTEMI  ENDO - hypoglycemic\Hyperglycemia protocol -check FSBS per protocol  GI GI PROPHYLAXIS as indicated Constipation protocol as indicated  ELECTROLYTES -follow labs as needed -replace as needed -pharmacy consultation and following  Best practice (right click and "Reselect all SmartList Selections" daily)  Diet: Full diet as tol Pain/Anxiety/Delirium protocol (if indicated): No VAP protocol (if indicated): Not indicated DVT prophylaxis: Systemic AC GI prophylaxis: N/A Central venous access:  N/A Arterial line:  N/A Foley:  N/A Mobility:  bed rest  Code Status:  DNR/DNI Disposition: Progressive Cardiac floor, pending bed placement  Labs   CBC: Recent Labs  Lab 05/07/21 1446 05/08/21 0552  WBC 16.6* 14.1*  NEUTROABS 14.2*  --   HGB 9.9* 9.4*  HCT 30.2* 28.5*  MCV 94.1 94.4  PLT 275 751    Basic Metabolic Panel: Recent Labs  Lab 05/07/21 1446 05/08/21 0552  NA 139 141  K 4.1 3.9  CL 101 103  CO2 27 26  GLUCOSE 120* 101*  BUN 27* 22  CREATININE 1.71* 1.50*  CALCIUM 8.7* 8.8*  MG  --  1.8   GFR: Estimated Creatinine Clearance: 37.9 mL/min (A) (by C-G formula based on SCr  of 1.5 mg/dL (H)). Recent Labs  Lab 05/07/21 1446 05/08/21 0552  PROCALCITON 1.38  --   WBC 16.6* 14.1*  LATICACIDVEN 0.9  --     Liver Function Tests: Recent Labs  Lab 05/07/21 1446  AST 22  ALT 15  ALKPHOS 80  BILITOT 0.9  PROT 6.5  ALBUMIN 2.6*   No results for input(s): LIPASE, AMYLASE in the last 168 hours. No results for input(s): AMMONIA in the last 168 hours.  ABG No results found for: PHART, PCO2ART, PO2ART, HCO3, TCO2, ACIDBASEDEF, O2SAT   Coagulation Profile: Recent Labs  Lab 05/07/21 1446  INR 1.7*    Cardiac Enzymes: No results for input(s): CKTOTAL, CKMB, CKMBINDEX, TROPONINI in the last 168 hours.  HbA1C: No results found for:  HGBA1C  CBG: No results for input(s): GLUCAP in the last 168 hours.   Past Medical History:  He,  has a past medical history of Bulging lumbar disc, Cancer ([place]), and Heart attack ([place]).   Surgical History:   Past Surgical History:  Procedure Laterality Date   CARDIAC CATHETERIZATION     two stents   KNEE SURGERY Left    PORTA CATH INSERTION N/A 05/21/2018   Procedure: PORTA CATH INSERTION;  Surgeon: Algernon Huxley, MD;  Location: [facility];  Service: Cardiovascular;  Laterality: N/A;     Social History:   reports that he quit smoking about 17 years ago. His smoking use included cigarettes. He has a 22.50 pack-year smoking history. He has never used smokeless tobacco. He reports that he does not currently use alcohol. He reports that he does not use drugs.   Family History:  His family history includes Cancer in his maternal aunt and maternal aunt; Dementia in his paternal grandmother; Heart failure in his father and maternal uncle.   Allergies No Known Allergies   Home Medications  Prior to Admission medications   Medication Sig Start Date End Date Taking? Authorizing Provider  albuterol (VENTOLIN HFA) 108 (90 Base) MCG/ACT inhaler Inhale 2 puffs into the lungs every 6 (six) hours as needed for wheezing or shortness of breath. 04/09/21  Yes Cammie Sickle, MD  ALPRAZolam Duanne Moron) 1 MG tablet Take 1 tablet (1 mg total) by mouth 3 (three) times daily as needed for anxiety. 04/29/21  Yes Borders, Kirt Boys, NP  apixaban (ELIQUIS) 5 MG TABS tablet Take 1 tablet (5 mg total) by mouth 2 (two) times daily. 10/23/20  Yes Corcoran, Melissa C, MD  dexamethasone (DECADRON) 4 MG tablet TAKE 1 TABLET BY MOUTH TWICE A DAY BEFORE ALIMTA CHEMO, THEN TAKE 2 TABS ONCE DAILY X 3 DAYS STARTING DAY AFTER CHEMO 02/27/21  Yes Cammie Sickle, MD  FLUoxetine (PROZAC) 40 MG capsule Take 1 capsule (40 mg total) by mouth daily. 05/03/21  Yes Borders, Kirt Boys, NP  folic acid (FOLVITE) 1 MG  tablet Take 1 tablet (1 mg total) by mouth daily. Continue until 21 days after Alimta completed. 12/03/20  Yes Borders, Kirt Boys, NP  OLANZapine (ZYPREXA) 10 MG tablet Take 1 tablet (10 mg total) by mouth at bedtime. 04/03/21  Yes Borders, Kirt Boys, NP  Oxycodone HCl 20 MG TABS Take 1-1.5 tablets (20-30 mg total) by mouth every 4 (four) hours as needed. 04/29/21  Yes Borders, Kirt Boys, NP  polyethylene glycol (MIRALAX / GLYCOLAX) packet Take 17 g by mouth daily.   Yes [provider]  prochlorperazine (COMPAZINE) 10 MG tablet Take 1 tablet (10 mg total) by mouth every 6 (  six) hours as needed for nausea or vomiting. 10/10/20  Yes Borders, Kirt Boys, NP  senna (SENOKOT) 8.6 MG tablet Take 1 tablet by mouth as needed.    Yes [provider]       DVT/GI PRX  assessed I Assessed the need for Labs I Assessed the need for Foley I Assessed the need for Central Venous Line Family Discussion when available I Assessed the need for Mobilization I made an Assessment of medications to be adjusted accordingly Safety Risk assessment completed    Critical Care Time devoted to patient care services described in this note is 59 minutes.   Critical care was necessary to treat /prevent imminent and life-threatening deterioration.   PATIENT WITH VERY POOR PROGNOSIS Patient is DNR/DNI agreeable to short-term NIPPV    Tanice Petre Patricia Pesa, M.D.  [facility]  Medical Director [place] Director [facility] Department

## 2021-05-08 NOTE — Consult Note (Signed)
Cardiology Consultation:   Patient ID: Jimmy West MRN: [id number]; DOB: 09/03/1950  Admit date: 05/07/2021 Date of Consult: 05/08/2021  PCP:  Merryl Hacker, No   [facility] Providers Cardiologist: New- [place] rounding   Patient Profile:   Jimmy West is a 70 y.o. male with a hx of static lung cancer, CAD/PCI 2005, who is being seen 05/08/2021 for the evaluation of elevated troponins at the request of Dr.Kumar.  History of Present Illness:   Jimmy West is a 70 year old male with history of CAD/PCI 2005, metastatic lung cancer on chemo, history of PE on Eliquis, anemia who presents due to worsening shortness of breath and cough.  He was recently admitted 1 to 2 weeks ago due to shortness of breath and cough, work-up revealed a right lower lobe pneumonia deemed secondary to aspiration.  He was managed with antibiotics and steroids and discharged.  He lives by himself.  He states getting more short of breath upon discharge.  Symptoms worsen causing him to call EMS.  He denies chest pain, has not followed up with cardiology since stent placement in 2005.  In the ED, chest x-ray showed left lower lobe pneumonia, patient started on antibiotics.  Blood work revealed troponin elevation of peak 3506.  Started on IV heparin for NSTEMI.  Patient desaturates with minimal ambulation in the ED   Past Medical History:  Diagnosis Date   Bulging lumbar disc    Cancer ([place])    stage 4 lung cancer   Heart attack [name])     Past Surgical History:  Procedure Laterality Date   CARDIAC CATHETERIZATION     two stents   KNEE SURGERY Left    PORTA CATH INSERTION N/A 05/21/2018   Procedure: PORTA CATH INSERTION;  Surgeon: Algernon Huxley, MD;  Location: [facility];  Service: Cardiovascular;  Laterality: N/A;     Home Medications:  Prior to Admission medications   Medication Sig Start Date End Date Taking? Authorizing Provider  albuterol (VENTOLIN HFA) 108 (90 Base) MCG/ACT  inhaler Inhale 2 puffs into the lungs every 6 (six) hours as needed for wheezing or shortness of breath. 04/09/21  Yes Cammie Sickle, MD  ALPRAZolam Duanne Moron) 1 MG tablet Take 1 tablet (1 mg total) by mouth 3 (three) times daily as needed for anxiety. 04/29/21  Yes Borders, Kirt Boys, NP  apixaban (ELIQUIS) 5 MG TABS tablet Take 1 tablet (5 mg total) by mouth 2 (two) times daily. 10/23/20  Yes Corcoran, Melissa C, MD  dexamethasone (DECADRON) 4 MG tablet TAKE 1 TABLET BY MOUTH TWICE A DAY BEFORE ALIMTA CHEMO, THEN TAKE 2 TABS ONCE DAILY X 3 DAYS STARTING DAY AFTER CHEMO 02/27/21  Yes Cammie Sickle, MD  FLUoxetine (PROZAC) 40 MG capsule Take 1 capsule (40 mg total) by mouth daily. 05/03/21  Yes Borders, Kirt Boys, NP  folic acid (FOLVITE) 1 MG tablet Take 1 tablet (1 mg total) by mouth daily. Continue until 21 days after Alimta completed. 12/03/20  Yes Borders, Kirt Boys, NP  OLANZapine (ZYPREXA) 10 MG tablet Take 1 tablet (10 mg total) by mouth at bedtime. 04/03/21  Yes Borders, Kirt Boys, NP  Oxycodone HCl 20 MG TABS Take 1-1.5 tablets (20-30 mg total) by mouth every 4 (four) hours as needed. 04/29/21  Yes Borders, Kirt Boys, NP  polyethylene glycol (MIRALAX / GLYCOLAX) packet Take 17 g by mouth daily.   Yes [provider]  prochlorperazine (COMPAZINE) 10 MG tablet Take 1 tablet (10 mg total)  by mouth every 6 (six) hours as needed for nausea or vomiting. 10/10/20  Yes Borders, Kirt Boys, NP  senna (SENOKOT) 8.6 MG tablet Take 1 tablet by mouth as needed.    Yes [provider]    Inpatient Medications: Scheduled Meds:  budesonide (PULMICORT) nebulizer solution  0.5 mg Nebulization BID   feeding supplement  237 mL Oral TID BM   FLUoxetine  40 mg Oral Daily   ipratropium-albuterol  3 mL Nebulization Q4H   methylPREDNISolone (SOLU-MEDROL) injection  40 mg Intravenous Q12H   OLANZapine  10 mg Oral QHS   vancomycin variable dose per unstable renal function (pharmacist dosing)    Does not apply See admin instructions   Continuous Infusions:  ceFEPime (MAXIPIME) IV Stopped (05/08/21 0926)   heparin 900 Units/hr (05/08/21 0814)   vancomycin     PRN Meds: acetaminophen, ALPRAZolam, docusate sodium, guaiFENesin-dextromethorphan, ondansetron (ZOFRAN) IV, ondansetron, oxyCODONE, polyethylene glycol  Allergies:   No Known Allergies  Social History:   Social History   Socioeconomic History   Marital status: Single    Spouse name: Not on file   Number of children: Not on file   Years of education: Not on file   Highest education level: Not on file  Occupational History   Not on file  Tobacco Use   Smoking status: Former    Packs/day: 1.50    Years: 15.00    Pack years: 22.50    Types: Cigarettes    Quit date: 11/03/2003    Years since quitting: 17.5   Smokeless tobacco: Never  Vaping Use   Vaping Use: Never used  Substance and Sexual Activity   Alcohol use: Not Currently   Drug use: No   Sexual activity: Not Currently  Other Topics Concern   Not on file  Social History Narrative   Not on file   Social Determinants of Health   Financial Resource Strain: Not on file  Food Insecurity: Not on file  Transportation Needs: Not on file  Physical Activity: Not on file  Stress: Not on file  Social Connections: Not on file  Intimate Partner Violence: Not on file    Family History:    Family History  Problem Relation Age of Onset   Heart failure Father    Cancer Maternal Aunt    Heart failure Maternal Uncle    Dementia Paternal Grandmother    Cancer Maternal Aunt      ROS:  Please see the history of present illness.   All other ROS reviewed and negative.     Physical Exam/Data:   Vitals:   05/08/21 1330 05/08/21 1345 05/08/21 1400 05/08/21 1415  BP: 103/74  103/63   Pulse: 100 (!) 103 100 (!) 104  Resp: (!) 31 (!) 31 (!) 22 (!) 23  Temp:      TempSrc:      SpO2: 91% 93% 99% 99%  Weight:      Height:        Intake/Output Summary  (Last 24 hours) at 05/08/2021 1716 Last data filed at 05/08/2021 0926 Gross per 24 hour  Intake 194.19 ml  Output --  Net 194.19 ml   Last 3 Weights 05/07/2021 04/24/2021 04/09/2021  Weight (lbs) 128 lb 12 oz 130 lb 15.3 oz 137 lb 2 oz  Weight (kg) 58.4 kg 59.4 kg 62.2 kg     Body mass index is 20.78 kg/m.  General: Cachectic, soft-spoken HEENT: normal Neck: no JVD Vascular: No carotid bruits;  Cardiac: Regular  rate and rhythm, distant heart sounds Lungs: Rhonchorous breath sounds Abd: soft, nontender, no hepatomegaly  Ext: no edema Musculoskeletal:  No deformities, BUE and BLE strength normal and equal Skin: warm and dry  Neuro:  CNs 2-12 intact, no focal abnormalities noted Psych:  Normal affect   EKG:  The EKG was personally reviewed and demonstrates: Sinus rhythm, possible old lateral infarct Telemetry:  Telemetry was personally reviewed and demonstrates: Sinus rhythm  Relevant CV Studies: EKG obtained today reviewed by myself EF 40 to 45% Mid to apical anteroseptal wall hypokinesis  Laboratory Data:  High Sensitivity Troponin:   Recent Labs  Lab 04/24/21 2120 05/07/21 1446 05/07/21 1714 05/08/21 0320 05/08/21 0552  TROPONINIHS 179* 3,153* 3,506* 2,927* 2,894*     Chemistry Recent Labs  Lab 05/07/21 1446 05/08/21 0552  NA 139 141  K 4.1 3.9  CL 101 103  CO2 27 26  GLUCOSE 120* 101*  BUN 27* 22  CREATININE 1.71* 1.50*  CALCIUM 8.7* 8.8*  MG  --  1.8  GFRNONAA 43* 50*  ANIONGAP 11 12    Recent Labs  Lab 05/07/21 1446  PROT 6.5  ALBUMIN 2.6*  AST 22  ALT 15  ALKPHOS 80  BILITOT 0.9   Lipids No results for input(s): CHOL, TRIG, HDL, LABVLDL, LDLCALC, CHOLHDL in the last 168 hours.  Hematology Recent Labs  Lab 05/07/21 1446 05/08/21 0552  WBC 16.6* 14.1*  RBC 3.21* 3.02*  HGB 9.9* 9.4*  HCT 30.2* 28.5*  MCV 94.1 94.4  MCH 30.8 31.1  MCHC 32.8 33.0  RDW 20.8* 20.8*  PLT 275 263   Thyroid No results for input(s): TSH, FREET4 in the  last 168 hours.  BNP Recent Labs  Lab 05/07/21 1446  BNP 1,105.5*    DDimer  Recent Labs  Lab 05/07/21 1446  DDIMER 1.70*     Radiology/Studies:  CT Angio Chest PE W and/or Wo Contrast  Result Date: 05/07/2021 CLINICAL DATA:  Weakness and cough. History of metastatic lung cancer. EXAM: CT ANGIOGRAPHY CHEST WITH CONTRAST TECHNIQUE: Multidetector CT imaging of the chest was performed using the standard protocol during bolus administration of intravenous contrast. Multiplanar CT image reconstructions and MIPs were obtained to evaluate the vascular anatomy. CONTRAST:  3mL OMNIPAQUE IOHEXOL 350 MG/ML SOLN COMPARISON:  11/02/2019 FINDINGS: Cardiovascular: The heart is mildly enlarged but stable. No pericardial effusion. Stable tortuosity and calcification of the thoracic aorta but no aneurysm or dissection. The branch vessels are patent. Three-vessel coronary artery calcifications are noted. The right IJ power port is in unchanged position. No complicating features. The pulmonary arterial tree is well opacified. No filling defects to suggest pulmonary embolism. Mediastinum/Nodes: No mediastinal or hilar mass or lymphadenopathy the. The esophagus is grossly. Lungs/Pleura: Stable underlying advanced emphysematous changes and areas of pulmonary scarring. Right lower lobe lung mass measures approximately 2.8 x 2.2 cm and previously measured 3.3 x 3.2 cm. Some surrounding interstitial scarring changes likely related to radiation. Peripheral right upper lobe lesion on image number 29/6 is stable measuring a maximum of 13 mm. Diffuse interstitial and airspace process in both lungs is most likely an infectious process. Aspiration would be a consideration. There is also a small right pleural effusion. Upper Abdomen: Few tiny low-attenuation hepatic lesions are stable. Stable splenomegaly. Stable right renal cyst. Musculoskeletal: No chest wall mass, supraclavicular or axillary adenopathy. The bony thorax is  intact. No worrisome bone lesions. Stable severe compression deformity of T4 with vertebral plana appearance. Review of the MIP images confirms  the above findings. IMPRESSION: 1. No CT findings for pulmonary embolism. 2. Stable tortuosity and calcification of the thoracic aorta but no dissection. 3. Interval decrease in size of the right lower lobe lung mass. The right upper lobe peripheral pulmonary nodule is stable. 4. Interstitial and airspace process in both lungs is likely an infectious process. Aspiration would be a consideration. Aortic Atherosclerosis (ICD10-I70.0) and Emphysema (ICD10-J43.9). Electronically Signed   By: Marijo Sanes M.D.   On: 05/07/2021 16:56   DG Chest Port 1 View  Result Date: 05/07/2021 CLINICAL DATA:  Sepsis.  History of right lower lobe lung cancer. EXAM: PORTABLE CHEST 1 VIEW COMPARISON:  Chest x-ray 04/24/2021.  PET-CT 01/03/2021 FINDINGS: The right IJ power port is in stable position. The cardiac silhouette, mediastinal and hilar contours are stable. Extensive opacity in the right lower lobe most likely radiation fibrosis. New left lower lobe airspace process most consistent with pneumonia. No definite pleural effusions or pneumothorax. IMPRESSION: New left lower lobe airspace process most consistent with pneumonia. Extensive right lower lobe density likely radiation change. Electronically Signed   By: Marijo Sanes M.D.   On: 05/07/2021 16:06   ECHOCARDIOGRAM COMPLETE  Result Date: 05/08/2021    ECHOCARDIOGRAM REPORT   Patient Name:   Jimmy West Date of Exam: 05/08/2021 Medical Rec #:  [id number]            Height:       66.0 in Accession #:    [id number]           Weight:       128.7 lb Date of Birth:  06-Aug-1950           BSA:          1.658 m Patient Age:    42 years             BP:           97/66 mmHg Patient Gender: M                    HR:           99 bpm. Exam Location:  [facility] Procedure: 2D Echo, Color Doppler and Cardiac Doppler Indications:      Elevated troponin  History:         Patient has no prior history of Echocardiogram examinations.                  Stage 4 lung cancer.  Sonographer:     Charmayne Sheer Referring Phys:  [id number] [place] Diagnosing Phys: Serafina Royals MD  Sonographer Comments: No subcostal window and suboptimal apical window. IMPRESSIONS  1. Left ventricular ejection fraction, by estimation, is 55 to 60%. The left ventricle has normal function. The left ventricle has no regional wall motion abnormalities. Left ventricular diastolic parameters are consistent with Grade I diastolic dysfunction (impaired relaxation).  2. Right ventricular systolic function is normal. The right ventricular size is normal.  3. The mitral valve is normal in structure. Mild mitral valve regurgitation.  4. The aortic valve is normal in structure. Aortic valve regurgitation is mild. FINDINGS  Left Ventricle: Left ventricular ejection fraction, by estimation, is 55 to 60%. The left ventricle has normal function. The left ventricle has no regional wall motion abnormalities. The left ventricular internal cavity size was normal in size. There is  no left ventricular hypertrophy. Left ventricular diastolic parameters are consistent with Grade I diastolic dysfunction (impaired relaxation). Right Ventricle: The right  ventricular size is normal. No increase in right ventricular wall thickness. Right ventricular systolic function is normal. Left Atrium: Left atrial size was normal in size. Right Atrium: Right atrial size was normal in size. Pericardium: There is no evidence of pericardial effusion. Mitral Valve: The mitral valve is normal in structure. Mild mitral valve regurgitation. MV peak gradient, 4.3 mmHg. The mean mitral valve gradient is 2.0 mmHg. Tricuspid Valve: The tricuspid valve is normal in structure. Tricuspid valve regurgitation is mild. Aortic Valve: The aortic valve is normal in structure. Aortic valve regurgitation is mild. Aortic valve mean gradient  measures 4.0 mmHg. Aortic valve peak gradient measures 7.0 mmHg. Aortic valve area, by VTI measures 3.72 cm. Pulmonic Valve: The pulmonic valve was normal in structure. Pulmonic valve regurgitation is not visualized. Aorta: The aortic root and ascending aorta are structurally normal, with no evidence of dilitation. IAS/Shunts: No atrial level shunt detected by color flow Doppler.  LEFT VENTRICLE PLAX 2D LVIDd:         4.80 cm   Diastology LVIDs:         3.20 cm   LV e' medial:    4.68 cm/s LV PW:         0.90 cm   LV E/e' medial:  14.5 LV IVS:        0.70 cm   LV e' lateral:   4.68 cm/s LVOT diam:     2.20 cm   LV E/e' lateral: 14.5 LV SV:         68 LV SV Index:   41 LVOT Area:     3.80 cm  LEFT ATRIUM             Index LA diam:        3.10 cm 1.87 cm/m LA Vol (A2C):   31.1 ml 18.75 ml/m LA Vol (A4C):   34.6 ml 20.86 ml/m LA Biplane Vol: 34.2 ml 20.62 ml/m  AORTIC VALVE                    PULMONIC VALVE AV Area (Vmax):    3.37 cm     PV Vmax:       1.13 m/s AV Area (Vmean):   3.31 cm     PV Vmean:      72.900 cm/s AV Area (VTI):     3.72 cm     PV VTI:        0.180 m AV Vmax:           132.00 cm/s  PV Peak grad:  5.1 mmHg AV Vmean:          90.300 cm/s  PV Mean grad:  3.0 mmHg AV VTI:            0.183 m AV Peak Grad:      7.0 mmHg AV Mean Grad:      4.0 mmHg LVOT Vmax:         117.00 cm/s LVOT Vmean:        78.700 cm/s LVOT VTI:          0.179 m LVOT/AV VTI ratio: 0.98  AORTA Ao Root diam: 3.50 cm MITRAL VALVE MV Area (PHT): 8.43 cm    SHUNTS MV Area VTI:   3.82 cm    Systemic VTI:  0.18 m MV Peak grad:  4.3 mmHg    Systemic Diam: 2.20 cm MV Mean grad:  2.0 mmHg MV Vmax:       1.04 m/s  MV Vmean:      67.3 cm/s MV Decel Time: 90 msec MV E velocity: 67.70 cm/s MV A velocity: 96.40 cm/s MV E/A ratio:  0.70 Serafina Royals MD Electronically signed by Serafina Royals MD Signature Date/Time: 05/08/2021/1:34:35 PM    Final      Assessment and Plan:   Shortness of breath, NSTEMI, CAD/PCI 2005 -Troponins  peaked at 3506 -Continue heparin x48-hour course. -Echo with mid to apical anteroseptal akinesis. -Due to anemia, stage IV lung cancer, current pneumonia diagnosis, renal dysfunction, not recommending invasive work-up/left heart cath at this point. -Consider Myoview after patient's pneumonia/respiratory function is resolved.  This can be performed as outpatient. -Recommend considering left heart cath only for high risk ischemia on Myoview.  2.  Cardiomyopathy, EF 40-45,  -Echo reviewed by myself showing wall motion abnormalities/mid to apical anteroseptal akinesis. -Start Lasix 20 mg daily. -Low blood pressures, preventing addition of GDMT at this time.  3.  Anemia, metastatic lung cancer -Management as per medicine, oncology  4.  Left lower lobe pneumonia -Antibiotics, as per hospitalist team.  5. Hx of PE -heparin -pta eliquis on discharge  Total encounter time 110 minutes  Greater than 50% was spent in counseling and coordination of care with the patient   Signed, Kate Sable, MD  05/08/2021 5:16 PM

## 2021-05-08 NOTE — ED Notes (Signed)
Pt taken in the bed to the commode in the room and left on his O2. Just getting up and back into the bed from the commode pt desated to 72% and was tachapenic. Put O2 to 10L [place] and is now at 90% is no longer working to breath. Will continue to monitor his oxygen saturation.

## 2021-05-08 NOTE — Consult Note (Signed)
ANTICOAGULATION CONSULT NOTE - Initial Consult  Pharmacy Consult for heparin Indication: chest pain/ACS  No Known Allergies  Patient Measurements: Height: 5\' 6"  (167.6 cm) Weight: 58.4 kg (128 lb 12 oz) IBW/kg (Calculated) : 63.8 Heparin Dosing Weight: 58.4kg  Vital Signs: BP: 106/77 (10/19 0600) Pulse Rate: 90 (10/19 0600)  Labs: Recent Labs    05/07/21 1446 05/07/21 1714 05/08/21 0320 05/08/21 0552  HGB 9.9*  --   --  9.4*  HCT 30.2*  --   --  28.5*  PLT 275  --   --  263  APTT  --  50*  --  48*  LABPROT 20.0*  --   --   --   INR 1.7*  --   --   --   HEPARINUNFRC  --   --   --  >1.10*  CREATININE 1.71*  --   --   --   TROPONINIHS 3,153* 3,506* 2,927* 2,894*     Estimated Creatinine Clearance: 33.2 mL/min (A) (by C-G formula based on SCr of 1.71 mg/dL (H)).   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 Troponin's were found to be elevated to 3153. Pharmacy was consulted for heparin dosing.   Baseline CBC was within normal limits Baseline PT 20 and INR 1.7 Baseline aPTT 50 sec  Apixaban noted on PTA med rec  Date/Time aPTT/HL Rate 10/19@0552  48 sec/>1.10 700 units/hr  Goal of Therapy:  Heparin level 0.3-0.7 units/ml aPTT 66-102 seconds Monitor  platelets by anticoagulation protocol: Yes   Plan:  Subtherapeutic aPTT Give 1750 units bolus x 1 Increase heparin infusion to 700 units/hr Will check aPTT in 8 hours  Will check daily HL and switch to monitoring HL when correlating to aPTT Continue to monitor H&H and platelets with daily CBC while on heparin ggt   Wilbert Hayashi A Paulita Licklider 05/08/2021,7:34 AM

## 2021-05-08 NOTE — ED Notes (Signed)
Pt ambulated to the restroom with standby assist with this RN.

## 2021-05-09 DIAGNOSIS — R0600 Dyspnea, unspecified: Secondary | ICD-10-CM | POA: Diagnosis not present

## 2021-05-09 DIAGNOSIS — I214 Non-ST elevation (NSTEMI) myocardial infarction: Principal | ICD-10-CM

## 2021-05-09 DIAGNOSIS — R0902 Hypoxemia: Secondary | ICD-10-CM | POA: Diagnosis not present

## 2021-05-09 DIAGNOSIS — J189 Pneumonia, unspecified organism: Secondary | ICD-10-CM

## 2021-05-09 DIAGNOSIS — J9601 Acute respiratory failure with hypoxia: Secondary | ICD-10-CM | POA: Diagnosis not present

## 2021-05-09 DIAGNOSIS — I420 Dilated cardiomyopathy: Secondary | ICD-10-CM

## 2021-05-09 LAB — MAGNESIUM: Magnesium: 2.1 mg/dL (ref 1.7–2.4)

## 2021-05-09 LAB — CBC
HCT: 25.4 % — ABNORMAL LOW (ref 39.0–52.0)
Hemoglobin: 8.1 g/dL — ABNORMAL LOW (ref 13.0–17.0)
MCH: 30.1 pg (ref 26.0–34.0)
MCHC: 31.9 g/dL (ref 30.0–36.0)
MCV: 94.4 fL (ref 80.0–100.0)
Platelets: 238 10*3/uL (ref 150–400)
RBC: 2.69 MIL/uL — ABNORMAL LOW (ref 4.22–5.81)
RDW: 20.4 % — ABNORMAL HIGH (ref 11.5–15.5)
WBC: 9.5 10*3/uL (ref 4.0–10.5)
nRBC: 0 % (ref 0.0–0.2)

## 2021-05-09 LAB — HEPARIN LEVEL (UNFRACTIONATED): Heparin Unfractionated: 0.61 IU/mL (ref 0.30–0.70)

## 2021-05-09 LAB — BASIC METABOLIC PANEL
Anion gap: 9 (ref 5–15)
BUN: 30 mg/dL — ABNORMAL HIGH (ref 8–23)
CO2: 26 mmol/L (ref 22–32)
Calcium: 8.4 mg/dL — ABNORMAL LOW (ref 8.9–10.3)
Chloride: 103 mmol/L (ref 98–111)
Creatinine, Ser: 1.44 mg/dL — ABNORMAL HIGH (ref 0.61–1.24)
GFR, Estimated: 52 mL/min — ABNORMAL LOW (ref >60)
Glucose, Bld: 129 mg/dL — ABNORMAL HIGH (ref 70–99)
Potassium: 4.6 mmol/L (ref 3.5–5.1)
Sodium: 138 mmol/L (ref 135–145)

## 2021-05-09 LAB — APTT
aPTT: 62 seconds — ABNORMAL HIGH (ref 24–36)
aPTT: 50 seconds — ABNORMAL HIGH (ref 24–36)

## 2021-05-09 LAB — MRSA NEXT GEN BY PCR, NASAL: MRSA by PCR Next Gen: NOT DETECTED

## 2021-05-09 MED ORDER — HEPARIN BOLUS VIA INFUSION
850.0000 [IU] | Freq: Once | INTRAVENOUS | Status: AC
  Administered 2021-05-09: 850 [IU] via INTRAVENOUS
  Filled 2021-05-09: qty 850

## 2021-05-09 MED ORDER — FUROSEMIDE 40 MG PO TABS
20.0000 mg | ORAL_TABLET | Freq: Every day | ORAL | Status: DC
  Administered 2021-05-10: 20 mg via ORAL
  Filled 2021-05-09: qty 1

## 2021-05-09 MED ORDER — APIXABAN 5 MG PO TABS
5.0000 mg | ORAL_TABLET | Freq: Two times a day (BID) | ORAL | Status: DC
  Administered 2021-05-09 – 2021-06-13 (×70): 5 mg via ORAL
  Filled 2021-05-09 (×70): qty 1

## 2021-05-09 MED ORDER — HEPARIN BOLUS VIA INFUSION
800.0000 [IU] | Freq: Once | INTRAVENOUS | Status: AC
  Administered 2021-05-09: 800 [IU] via INTRAVENOUS
  Filled 2021-05-09: qty 800

## 2021-05-09 MED ORDER — ATORVASTATIN CALCIUM 20 MG PO TABS
40.0000 mg | ORAL_TABLET | Freq: Every day | ORAL | Status: DC
  Administered 2021-05-09 – 2021-06-13 (×36): 40 mg via ORAL
  Filled 2021-05-09 (×36): qty 2

## 2021-05-09 NOTE — Progress Notes (Addendum)
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  Patient was recently discharged home on 04/28/2021 on 2 L of oxygen after he was treated for aspiration pneumonia.  Work-up in the ED shows chest x-ray new left lower lobe opacity consistent with pneumonia.  CTA chest negative for PE.  Elevated troponins consistent with NSTEMI. Patient is started on IV antibiotics,  vancomycin and cefepime and is started on heparin gtt. Cardiology and pulmonology consulted. Patient is admitted for NSTEMI, severe sepsis secondary to left lower lobe pneumonia.  Assessment & Plan:   Active Problems:   Acute hypoxemic respiratory failure (HCC)  Acute on chronic hypoxic respiratory failure sec. to LLL PNA: Patient uses 2 L of oxygen at home. Presented with hypoxia with SPO2 80% on room air. Oxygen requirement increased to HFNC 8 L. Chest x-ray shows left lower lobe pneumonia. Procalcitonin elevated 1.38 with new leukocytosis. Continue vancomycin and cefepime, recently treated for aspiration pneumonia with IV Unasyn and then Augmentin. Continue supplemental oxygen to keep saturation above 88% Continue DuoNeb every 6 hours, Continue Solu-Medrol 40 mg q 12hr. Continue BiPAP as night and as needed.  Severe sepsis POA sec.to PNA: Presented with tachycardia, tachypnea, leukocytosis, AKI with source pneumonia Continue IV antibiotics, follow blood cultures.  Possible NSTEMI: Troponin 3153 > 3506. Patient denies any chest pain.  EKG no ischemic changes Continue heparin for 48 hours. 2D  echocardiogram LVEF 40 to 45%.  [facility] consulted.  Recommended Myoview once pneumonia resolves.  That can be done outpatient.   CAD s/p stent in 2005: Patient is not on any cardioprotective medications at home.   AKI: Baseline serum creatinine 1.30, presented with 1.71 Could be due to poor hydration,  Continue IV hydration.  Hypotension: Not on blood pressure medications at home. Blood pressure seems like at baseline. Continue IV hydration.   Stage IV lung cancer: Patient has received radiation and chemotherapy previously Not on any due to poor clinical and functional status.  Chronic anemia: Due to antineoplastic therapy and chronic disease. Hemoglobin is stable  Chronic pain syndrome: Continue oxycodone and bowel regimen as needed.   Anxiety and depression: continue Prozac Zyprexa.    DVT prophylaxis: Heparin GTT Code Status: DNR Family Communication: No family at bedside Disposition Plan:   Status is: Inpatient  Remains inpatient appropriate because: IV antibiotics for pneumonia and IV heparin for non-STEMI.  Anticipated discharge home in few days once medically cleared.   Consultants:  Cardiology, pulmonology  Procedures: None Antimicrobials:   Anti-infectives (From admission, onward)    Start     Dose/Rate Route Frequency Ordered Stop   05/08/21 1800  vancomycin (VANCOREADY) IVPB 750 mg/150 mL  Status:  Discontinued        750 mg 150 mL/hr over 60 Minutes Intravenous Every 24 hours 05/08/21 1135 05/09/21 1448   05/08/21 0800  ceFEPIme (MAXIPIME) 2 g in sodium chloride 0.9 % 100 mL IVPB        2 g 200 mL/hr over 30 Minutes Intravenous Every 12 hours 05/07/21 1929     05/07/21 2200  ceFEPIme (MAXIPIME) 2 g  in sodium chloride 0.9 % 100 mL IVPB  Status:  Discontinued        2 g 200 mL/hr over 30 Minutes Intravenous Every 12 hours 05/07/21 1927 05/07/21 1929   05/07/21 1930  vancomycin (VANCOREADY) IVPB 500 mg/100 mL        500 mg 100 mL/hr over  60 Minutes Intravenous  Once 05/07/21 1927 05/07/21 2144   05/07/21 1927  vancomycin variable dose per unstable renal function (pharmacist dosing)  Status:  Discontinued         Does not apply See admin instructions 05/07/21 1927 05/09/21 1448   05/07/21 1600  vancomycin (VANCOCIN) IVPB 1000 mg/200 mL premix        1,000 mg 200 mL/hr over 60 Minutes Intravenous  Once 05/07/21 1552 05/07/21 1801   05/07/21 1600  ceFEPIme (MAXIPIME) 2 g in sodium chloride 0.9 % 100 mL IVPB        2 g 200 mL/hr over 30 Minutes Intravenous  Once 05/07/21 1552 05/07/21 1700        Subjective: Patient was seen and examined at bedside.  Overnight events noted.   Patient seems slightly better than yesterday.  He is still requiring 8 L of high flow oxygen. Respiratory rate is improved.  Denies any chest pain.  Objective: Vitals:   05/09/21 1245 05/09/21 1300 05/09/21 1330 05/09/21 1430  BP: 92/60 92/61 (!) 107/58 102/71  Pulse: 87 82 93 98  Resp: 13 14 (!) 25 20  Temp:      TempSrc:      SpO2: 98% 98% 94% 92%  Weight:      Height:        Intake/Output Summary (Last 24 hours) at 05/09/2021 1537 Last data filed at 05/09/2021 1027 Gross per 24 hour  Intake 100 ml  Output --  Net 100 ml   Filed Weights   05/07/21 1439  Weight: 58.4 kg    Examination:  General exam: Appears tachypneic, SPO2 89% on 8 L/M, via nasal cannula. Respiratory system: Clear to auscultation with reduced breath sounds bilaterally.  RR 19 Cardiovascular system: S1-S2 heard, regular rate and rhythm, no murmur. Gastrointestinal system: Abdomen is soft, nontender, nondistended, BS+ Central nervous system: Alert and oriented. No focal neurological deficits. Extremities: No edema, no cyanosis, no clubbing. Skin: No rashes, lesions or ulcers Psychiatry: Judgement and insight appear normal. Mood & affect appropriate.     Data Reviewed: I have personally reviewed following labs and imaging studies  CBC: Recent Labs  Lab  05/07/21 1446 05/08/21 0552 05/09/21 0701  WBC 16.6* 14.1* 9.5  NEUTROABS 14.2*  --   --   HGB 9.9* 9.4* 8.1*  HCT 30.2* 28.5* 25.4*  MCV 94.1 94.4 94.4  PLT 275 263 373   Basic Metabolic Panel: Recent Labs  Lab 05/07/21 1446 05/08/21 0552 05/09/21 0701  NA 139 141 138  K 4.1 3.9 4.6  CL 101 103 103  CO2 27 26 26   GLUCOSE 120* 101* 129*  BUN 27* 22 30*  CREATININE 1.71* 1.50* 1.44*  CALCIUM 8.7* 8.8* 8.4*  MG  --  1.8 2.1   GFR: Estimated Creatinine Clearance: 39.4 mL/min (A) (by C-G formula based on SCr of 1.44 mg/dL (H)). Liver Function Tests: Recent Labs  Lab 05/07/21 1446  AST 22  ALT 15  ALKPHOS 80  BILITOT 0.9  PROT 6.5  ALBUMIN 2.6*   No results for input(s): LIPASE, AMYLASE in the last 168 hours. No results for input(s): AMMONIA in the last  168 hours. Coagulation Profile: Recent Labs  Lab 05/07/21 1446  INR 1.7*   Cardiac Enzymes: No results for input(s): CKTOTAL, CKMB, CKMBINDEX, TROPONINI in the last 168 hours. BNP (last 3 results) No results for input(s): PROBNP in the last 8760 hours. HbA1C: No results for input(s): HGBA1C in the last 72 hours. CBG: No results for input(s): GLUCAP in the last 168 hours. Lipid Profile: No results for input(s): CHOL, HDL, LDLCALC, TRIG, CHOLHDL, LDLDIRECT in the last 72 hours. Thyroid Function Tests: No results for input(s): TSH, T4TOTAL, FREET4, T3FREE, THYROIDAB in the last 72 hours. Anemia Panel: No results for input(s): VITAMINB12, FOLATE, FERRITIN, TIBC, IRON, RETICCTPCT in the last 72 hours. Sepsis Labs: Recent Labs  Lab 05/07/21 1446  PROCALCITON 1.38  LATICACIDVEN 0.9    Recent Results (from the past 240 hour(s))  Culture, blood (Routine x 2)     Status: None (Preliminary result)   Collection Time: 05/07/21  2:46 PM   Specimen: BLOOD  Result Value Ref Range Status   Specimen Description BLOOD PORTA CATH  Final   Special Requests   Final    BOTTLES DRAWN AEROBIC AND ANAEROBIC Blood Culture  results may not be optimal due to an excessive volume of blood received in culture bottles   Culture   Final    NO GROWTH 2 DAYS Performed at [facility], [address], [place], [place] [address]    Report Status PENDING  Incomplete  Culture, blood (Routine x 2)     Status: None (Preliminary result)   Collection Time: 05/07/21  2:46 PM   Specimen: BLOOD  Result Value Ref Range Status   Specimen Description BLOOD RIGHT ANTECUBITAL  Final   Special Requests   Final    BOTTLES DRAWN AEROBIC AND ANAEROBIC Blood Culture results may not be optimal due to an excessive volume of blood received in culture bottles   Culture   Final    NO GROWTH 2 DAYS Performed at [facility], [address] [place], [place] [address]    Report Status PENDING  Incomplete  Resp Panel by RT-PCR (Flu A&B, Covid) Nasopharyngeal Swab     Status: None   Collection Time: 05/07/21  5:57 PM   Specimen: Nasopharyngeal Swab; Nasopharyngeal(NP) swabs in vial transport medium  Result Value Ref Range Status   SARS Coronavirus 2 by RT PCR NEGATIVE NEGATIVE Final    Comment: (NOTE) SARS-CoV-2 target nucleic acids are NOT DETECTED.  The SARS-CoV-2 RNA is generally detectable in upper respiratory specimens during the acute phase of infection. The lowest concentration of SARS-CoV-2 viral copies this assay can detect is 138 copies/mL. A negative result does not preclude SARS-Cov-2 infection and should not be used as the sole basis for treatment or other patient management decisions. A negative result may occur with  improper specimen collection/handling, submission of specimen other than nasopharyngeal swab, presence of viral mutation(s) within the areas targeted by this assay, and inadequate number of viral copies(<138 copies/mL). A negative result must be combined with clinical observations, patient history, and epidemiological information. The expected result is Negative.  Fact Sheet for  Patients:  EntrepreneurPulse.com.au  Fact Sheet for Healthcare Providers:  IncredibleEmployment.be  This test is no t yet approved or cleared by the [place] FDA and  has been authorized for detection and/or diagnosis of SARS-CoV-2 by FDA under an Emergency Use Authorization (EUA). This EUA will remain  in effect (meaning this test can be used) for the duration of the COVID-19 declaration under  Section 564(b)(1) of the Act, 21 U.S.C.section 360bbb-3(b)(1), unless the authorization is terminated  or revoked sooner.       Influenza A by PCR NEGATIVE NEGATIVE Final   Influenza B by PCR NEGATIVE NEGATIVE Final    Comment: (NOTE) The Xpert Xpress SARS-CoV-2/FLU/RSV plus assay is intended as an aid in the diagnosis of influenza from Nasopharyngeal swab specimens and should not be used as a sole basis for treatment. Nasal washings and aspirates are unacceptable for Xpert Xpress SARS-CoV-2/FLU/RSV testing.  Fact Sheet for Patients: EntrepreneurPulse.com.au  Fact Sheet for Healthcare Providers: IncredibleEmployment.be  This test is not yet approved or cleared by the [place] FDA and has been authorized for detection and/or diagnosis of SARS-CoV-2 by FDA under an Emergency Use Authorization (EUA). This EUA will remain in effect (meaning this test can be used) for the duration of the COVID-19 declaration under Section 564(b)(1) of the Act, 21 U.S.C. section 360bbb-3(b)(1), unless the authorization is terminated or revoked.  Performed at [facility], [address] [place], [place] [address]   MRSA Next Gen by PCR, Nasal     Status: None   Collection Time: 05/09/21  1:16 PM   Specimen: Nasal Mucosa; Nasal Swab  Result Value Ref Range Status   MRSA by PCR Next Gen NOT DETECTED NOT DETECTED Final    Comment: (NOTE) The GeneXpert MRSA Assay (FDA approved for NASAL specimens only), is one  component of a comprehensive MRSA colonization surveillance program. It is not intended to diagnose MRSA infection nor to guide or monitor treatment for MRSA infections. Test performance is not FDA approved in patients less than 51 years old. Performed at [facility], [address], [place], [place] [address]    Radiology Studies: CT Angio Chest PE W and/or Wo Contrast  Result Date: 05/07/2021 CLINICAL DATA:  Weakness and cough. History of metastatic lung cancer. EXAM: CT ANGIOGRAPHY CHEST WITH CONTRAST TECHNIQUE: Multidetector CT imaging of the chest was performed using the standard protocol during bolus administration of intravenous contrast. Multiplanar CT image reconstructions and MIPs were obtained to evaluate the vascular anatomy. CONTRAST:  30mL OMNIPAQUE IOHEXOL 350 MG/ML SOLN COMPARISON:  11/02/2019 FINDINGS: Cardiovascular: The heart is mildly enlarged but stable. No pericardial effusion. Stable tortuosity and calcification of the thoracic aorta but no aneurysm or dissection. The branch vessels are patent. Three-vessel coronary artery calcifications are noted. The right IJ power port is in unchanged position. No complicating features. The pulmonary arterial tree is well opacified. No filling defects to suggest pulmonary embolism. Mediastinum/Nodes: No mediastinal or hilar mass or lymphadenopathy the. The esophagus is grossly. Lungs/Pleura: Stable underlying advanced emphysematous changes and areas of pulmonary scarring. Right lower lobe lung mass measures approximately 2.8 x 2.2 cm and previously measured 3.3 x 3.2 cm. Some surrounding interstitial scarring changes likely related to radiation. Peripheral right upper lobe lesion on image number 29/6 is stable measuring a maximum of 13 mm. Diffuse interstitial and airspace process in both lungs is most likely an infectious process. Aspiration would be a consideration. There is also a small right pleural effusion. Upper Abdomen: Few  tiny low-attenuation hepatic lesions are stable. Stable splenomegaly. Stable right renal cyst. Musculoskeletal: No chest wall mass, supraclavicular or axillary adenopathy. The bony thorax is intact. No worrisome bone lesions. Stable severe compression deformity of T4 with vertebral plana appearance. Review of the MIP images confirms the above findings. IMPRESSION: 1. No CT findings for pulmonary embolism. 2. Stable tortuosity and calcification of the thoracic aorta but  no dissection. 3. Interval decrease in size of the right lower lobe lung mass. The right upper lobe peripheral pulmonary nodule is stable. 4. Interstitial and airspace process in both lungs is likely an infectious process. Aspiration would be a consideration. Aortic Atherosclerosis (ICD10-I70.0) and Emphysema (ICD10-J43.9). Electronically Signed   By: Marijo Sanes M.D.   On: 05/07/2021 16:56   ECHOCARDIOGRAM COMPLETE  Result Date: 05/08/2021    ECHOCARDIOGRAM REPORT   Patient Name:   Jimmy West Date of Exam: 05/08/2021 Medical Rec #:  [id number]            Height:       66.0 in Accession #:    [id number]           Weight:       128.7 lb Date of Birth:  1951/01/01           BSA:          1.658 m Patient Age:    40 years             BP:           97/66 mmHg Patient Gender: M                    HR:           99 bpm. Exam Location:  [facility] Procedure: 2D Echo, Color Doppler and Cardiac Doppler Indications:     Elevated troponin  History:         Patient has no prior history of Echocardiogram examinations.                  Stage 4 lung cancer.  Sonographer:     Charmayne Sheer Referring Phys:  [id number] [place] Diagnosing Phys: Serafina Royals MD  Sonographer Comments: No subcostal window and suboptimal apical window. IMPRESSIONS  1. Left ventricular ejection fraction, by estimation, is 55 to 60%. The left ventricle has normal function. The left ventricle has no regional wall motion abnormalities. Left ventricular diastolic parameters are consistent  with Grade I diastolic dysfunction (impaired relaxation).  2. Right ventricular systolic function is normal. The right ventricular size is normal.  3. The mitral valve is normal in structure. Mild mitral valve regurgitation.  4. The aortic valve is normal in structure. Aortic valve regurgitation is mild. FINDINGS  Left Ventricle: Left ventricular ejection fraction, by estimation, is 55 to 60%. The left ventricle has normal function. The left ventricle has no regional wall motion abnormalities. The left ventricular internal cavity size was normal in size. There is  no left ventricular hypertrophy. Left ventricular diastolic parameters are consistent with Grade I diastolic dysfunction (impaired relaxation). Right Ventricle: The right ventricular size is normal. No increase in right ventricular wall thickness. Right ventricular systolic function is normal. Left Atrium: Left atrial size was normal in size. Right Atrium: Right atrial size was normal in size. Pericardium: There is no evidence of pericardial effusion. Mitral Valve: The mitral valve is normal in structure. Mild mitral valve regurgitation. MV peak gradient, 4.3 mmHg. The mean mitral valve gradient is 2.0 mmHg. Tricuspid Valve: The tricuspid valve is normal in structure. Tricuspid valve regurgitation is mild. Aortic Valve: The aortic valve is normal in structure. Aortic valve regurgitation is mild. Aortic valve mean gradient measures 4.0 mmHg. Aortic valve peak gradient measures 7.0 mmHg. Aortic valve area, by VTI measures 3.72 cm. Pulmonic Valve: The pulmonic valve was normal in structure. Pulmonic valve regurgitation is not visualized. Aorta: The  aortic root and ascending aorta are structurally normal, with no evidence of dilitation. IAS/Shunts: No atrial level shunt detected by color flow Doppler.  LEFT VENTRICLE PLAX 2D LVIDd:         4.80 cm   Diastology LVIDs:         3.20 cm   LV e' medial:    4.68 cm/s LV PW:         0.90 cm   LV E/e' medial:  14.5  LV IVS:        0.70 cm   LV e' lateral:   4.68 cm/s LVOT diam:     2.20 cm   LV E/e' lateral: 14.5 LV SV:         68 LV SV Index:   41 LVOT Area:     3.80 cm  LEFT ATRIUM             Index LA diam:        3.10 cm 1.87 cm/m LA Vol (A2C):   31.1 ml 18.75 ml/m LA Vol (A4C):   34.6 ml 20.86 ml/m LA Biplane Vol: 34.2 ml 20.62 ml/m  AORTIC VALVE                    PULMONIC VALVE AV Area (Vmax):    3.37 cm     PV Vmax:       1.13 m/s AV Area (Vmean):   3.31 cm     PV Vmean:      72.900 cm/s AV Area (VTI):     3.72 cm     PV VTI:        0.180 m AV Vmax:           132.00 cm/s  PV Peak grad:  5.1 mmHg AV Vmean:          90.300 cm/s  PV Mean grad:  3.0 mmHg AV VTI:            0.183 m AV Peak Grad:      7.0 mmHg AV Mean Grad:      4.0 mmHg LVOT Vmax:         117.00 cm/s LVOT Vmean:        78.700 cm/s LVOT VTI:          0.179 m LVOT/AV VTI ratio: 0.98  AORTA Ao Root diam: 3.50 cm MITRAL VALVE MV Area (PHT): 8.43 cm    SHUNTS MV Area VTI:   3.82 cm    Systemic VTI:  0.18 m MV Peak grad:  4.3 mmHg    Systemic Diam: 2.20 cm MV Mean grad:  2.0 mmHg MV Vmax:       1.04 m/s MV Vmean:      67.3 cm/s MV Decel Time: 90 msec MV E velocity: 67.70 cm/s MV A velocity: 96.40 cm/s MV E/A ratio:  0.70 Serafina Royals MD Electronically signed by Serafina Royals MD Signature Date/Time: 05/08/2021/1:34:35 PM    Final     Scheduled Meds:  apixaban  5 mg Oral BID   atorvastatin  40 mg Oral Daily   budesonide (PULMICORT) nebulizer solution  0.5 mg Nebulization BID   feeding supplement  237 mL Oral TID BM   FLUoxetine  40 mg Oral Daily   [START ON 05/10/2021] furosemide  20 mg Oral Daily   ipratropium-albuterol  3 mL Nebulization Q4H   methylPREDNISolone (SOLU-MEDROL) injection  40 mg Intravenous Q12H   OLANZapine  10 mg Oral QHS   Continuous  Infusions:  ceFEPime (MAXIPIME) IV Stopped (05/09/21 1027)   heparin 1,000 Units/hr (05/09/21 0905)     LOS: 2 days    Time spent: 35 mins    Ledell Codrington, MD [facility]   If 7PM-7AM, please contact night-coverage

## 2021-05-09 NOTE — Progress Notes (Addendum)
Progress Note  Patient Name: Jekhi Bolin Date of Encounter: 05/09/2021  Primary Cardiologist: Kate Sable, MD  Subjective   Breathing slightly improved.  No chest pain.  Inpatient Medications    Scheduled Meds:  budesonide (PULMICORT) nebulizer solution  0.5 mg Nebulization BID   feeding supplement  237 mL Oral TID BM   FLUoxetine  40 mg Oral Daily   furosemide  20 mg Intravenous Daily   ipratropium-albuterol  3 mL Nebulization Q4H   methylPREDNISolone (SOLU-MEDROL) injection  40 mg Intravenous Q12H   OLANZapine  10 mg Oral QHS   vancomycin variable dose per unstable renal function (pharmacist dosing)   Does not apply See admin instructions   Continuous Infusions:  ceFEPime (MAXIPIME) IV 2 g (05/09/21 0940)   heparin 1,000 Units/hr (05/09/21 0905)   vancomycin Stopped (05/08/21 1914)   PRN Meds: acetaminophen, ALPRAZolam, docusate sodium, guaiFENesin-dextromethorphan, ondansetron (ZOFRAN) IV, ondansetron, oxyCODONE, polyethylene glycol   Vital Signs    Vitals:   05/09/21 0400 05/09/21 0500 05/09/21 0600 05/09/21 0700  BP: 100/63 91/66 (!) 86/59 (!) 85/63  Pulse: 87 81 76 66  Resp: 20 13 11 11   Temp:      TempSrc:      SpO2: 96% 98% 95% 95%  Weight:      Height:       No intake or output data in the 24 hours ending 05/09/21 1003 Filed Weights   05/07/21 1439  Weight: 58.4 kg    Physical Exam   GEN: frail, in no acute distress.  HEENT: Grossly normal.  Neck: Supple, no JVD, carotid bruits, or masses. Cardiac: RRR, distant, no murmurs, rubs, or gallops. No clubbing, cyanosis, edema.  Radials 2+, DP/PT 1+ and equal bilaterally.  Respiratory:  Respirations regular and unlabored.  Insp/Exp wheezing throughout w/ coarse bibasilar rales.  Scattered rhonchi. GI: Soft, nontender, nondistended, BS + x 4. MS: no deformity or atrophy. Skin: warm and dry, no rash. Neuro:  Strength and sensation are intact. Psych: AAOx3.  Normal affect.  Labs     Chemistry Recent Labs  Lab 05/07/21 1446 05/08/21 0552 05/09/21 0701  NA 139 141 138  K 4.1 3.9 4.6  CL 101 103 103  CO2 27 26 26   GLUCOSE 120* 101* 129*  BUN 27* 22 30*  CREATININE 1.71* 1.50* 1.44*  CALCIUM 8.7* 8.8* 8.4*  PROT 6.5  --   --   ALBUMIN 2.6*  --   --   AST 22  --   --   ALT 15  --   --   ALKPHOS 80  --   --   BILITOT 0.9  --   --   GFRNONAA 43* 50* 52*  ANIONGAP 11 12 9      Hematology Recent Labs  Lab 05/07/21 1446 05/08/21 0552 05/09/21 0701  WBC 16.6* 14.1* 9.5  RBC 3.21* 3.02* 2.69*  HGB 9.9* 9.4* 8.1*  HCT 30.2* 28.5* 25.4*  MCV 94.1 94.4 94.4  MCH 30.8 31.1 30.1  MCHC 32.8 33.0 31.9  RDW 20.8* 20.8* 20.4*  PLT 275 263 238    Cardiac Enzymes  Recent Labs  Lab 04/24/21 2120 05/07/21 1446 05/07/21 1714 05/08/21 0320 05/08/21 0552  TROPONINIHS 179* 3,153* 3,506* 2,927* 2,894*      BNP Recent Labs  Lab 05/07/21 1446  BNP 1,105.5*     DDimer  Recent Labs  Lab 05/07/21 1446  DDIMER 1.70*    Radiology    CT Angio Chest PE W and/or Wo Contrast  Result Date: 05/07/2021 CLINICAL DATA:  Weakness and cough. History of metastatic lung cancer. EXAM: CT ANGIOGRAPHY CHEST WITH CONTRAST TECHNIQUE: Multidetector CT imaging of the chest was performed using the standard protocol during bolus administration of intravenous contrast. Multiplanar CT image reconstructions and MIPs were obtained to evaluate the vascular anatomy. CONTRAST:  61mL OMNIPAQUE IOHEXOL 350 MG/ML SOLN COMPARISON:  11/02/2019 FINDINGS: Cardiovascular: The heart is mildly enlarged but stable. No pericardial effusion. Stable tortuosity and calcification of the thoracic aorta but no aneurysm or dissection. The branch vessels are patent. Three-vessel coronary artery calcifications are noted. The right IJ power port is in unchanged position. No complicating features. The pulmonary arterial tree is well opacified. No filling defects to suggest pulmonary embolism. Mediastinum/Nodes:  No mediastinal or hilar mass or lymphadenopathy the. The esophagus is grossly. Lungs/Pleura: Stable underlying advanced emphysematous changes and areas of pulmonary scarring. Right lower lobe lung mass measures approximately 2.8 x 2.2 cm and previously measured 3.3 x 3.2 cm. Some surrounding interstitial scarring changes likely related to radiation. Peripheral right upper lobe lesion on image number 29/6 is stable measuring a maximum of 13 mm. Diffuse interstitial and airspace process in both lungs is most likely an infectious process. Aspiration would be a consideration. There is also a small right pleural effusion. Upper Abdomen: Few tiny low-attenuation hepatic lesions are stable. Stable splenomegaly. Stable right renal cyst. Musculoskeletal: No chest wall mass, supraclavicular or axillary adenopathy. The bony thorax is intact. No worrisome bone lesions. Stable severe compression deformity of T4 with vertebral plana appearance. Review of the MIP images confirms the above findings. IMPRESSION: 1. No CT findings for pulmonary embolism. 2. Stable tortuosity and calcification of the thoracic aorta but no dissection. 3. Interval decrease in size of the right lower lobe lung mass. The right upper lobe peripheral pulmonary nodule is stable. 4. Interstitial and airspace process in both lungs is likely an infectious process. Aspiration would be a consideration. Aortic Atherosclerosis (ICD10-I70.0) and Emphysema (ICD10-J43.9). Electronically Signed   By: Marijo Sanes M.D.   On: 05/07/2021 16:56   DG Chest Port 1 View  Result Date: 05/07/2021 CLINICAL DATA:  Sepsis.  History of right lower lobe lung cancer. EXAM: PORTABLE CHEST 1 VIEW COMPARISON:  Chest x-ray 04/24/2021.  PET-CT 01/03/2021 FINDINGS: The right IJ power port is in stable position. The cardiac silhouette, mediastinal and hilar contours are stable. Extensive opacity in the right lower lobe most likely radiation fibrosis. New left lower lobe airspace  process most consistent with pneumonia. No definite pleural effusions or pneumothorax. IMPRESSION: New left lower lobe airspace process most consistent with pneumonia. Extensive right lower lobe density likely radiation change. Electronically Signed   By: Marijo Sanes M.D.   On: 05/07/2021 16:06    Telemetry    RSR - Sinus tachycardia - Personally Reviewed  Cardiac Studies   2D Echocardiogram 10.19.2022  Reviewed by Dr. Garen Lah: EF 40-45% with mid to apical anteroseptal akinesis.  Patient Profile     70 year old male with a history of metastatic lung cancer on chemotherapy, prior PE on chronic Eliquis, anemia, stage III chronic kidney disease, and CAD status post PCI in 2005, who was admitted on October 18 with ongoing dyspnea in the setting of left lower lobe pneumonia and non-STEMI (peak high-sensitivity troponin 3506)  Assessment & Plan    1.  Left lower lobe pneumonia/acute on chronic hypoxic respiratory failure/Sepsis: Patient recently admitted with right lower lobe pneumonia treated with antibiotics and discharge.  He continued to have dyspnea at  home prompting representation with finding of left lower lobe pneumonia.  Antibiotic therapy, steroids, and nebulizers per medicine team.  2.  Non-STEMI/CAD: Status post PCI in 2005.  In the setting of above, troponin peaked at 3506.  Echocardiogram this admission shows an EF of 40 to 45% with mid to apical anteroseptal akinesis. No chest pain.  As prev noted, w/ active PNA, metastatic lung cancer, CKD III, and frailty, he's a poor candidate for cath at this time.  Following recovering from #1, will plan outpt MV for RF stratification.  Will add statin.  No ? blocker in setting of hypotension.  No ASA in setting of anemia and chronic eliquis.  3.  Ischemic cardiomyopathy/HFrEF:  EF 40-45% by echo this admission.  Appears euvolemic on exam, though bibasilar crackles noted.  Low dose IV lasix added 10/19.  BUN up sl but creat stable.  I/O  inaccurate.  Will plan to transition to lasix 20mg  PO daily for tomorrow.  4.  Stage III chronic kidney disease: Stable.  Follow on lasix.  5.  Normocytic anemia: H&H drifting down-8.1 25.4 this morning.  No evidence of bleeding.  Follow.  6.  History of pulmonary embolism: On chronic eliquis therapy at home.  Currently on heparin in the setting of #2.  Plan to resume eliquis tomorrow.  Of note, he was on 5mg  BID @ home.  Creat has been hovering around 1.5 (1.71  1.5  1.44).  Will need to monitor this closely and consider reducing to 2.5mg  BID if appropriate, as wt is < 60kg.  7.  Lipids:  Adding statin in setting of #2.  F/u lipids in AM.  8.  Hypotension:  in setting of #1/sepsis.  S/p LR infusion 10/18-19.  Pressures trending mid-80's this AM.  As above, will transition IV lasix to oral - may need to hold in AM if ongoing hypotension.  Otw avoiding antihypertensives.  Signed, Murray Hodgkins, NP  05/09/2021, 10:03 AM    For questions or updates, please contact   Please consult [email or web address] for contact info under Cardiology/STEMI.

## 2021-05-09 NOTE — ED Notes (Addendum)
Pt up on the side of the bed to use the urinal. Pt O2 decreased to low 80s at this time. Increased pt's flow rate to 10L [place], pt is now sat 93% on 10L [place] humidified. Dr. Dwyane Dee, MD made aware.

## 2021-05-09 NOTE — ED Notes (Signed)
Pt given breakfast tray at this time. 

## 2021-05-09 NOTE — ED Notes (Signed)
Dinner meal tray given.

## 2021-05-09 NOTE — Consult Note (Signed)
Pharmacy Antibiotic Note  Jimmy West is a 70 y.o. 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  Pharmacy has been consulted for vancomycin and cefepime dosing. Today is day 2 of ABX.   Afeb, WBC 16.6>14.1>9.5  Bcx NG x2 days, MRSA PCR ordered   Plan: Cefepime -Will continue cefepime 2g IV q12H   Vancomycin -Will continue vancomycin 750mg  IV q24h    -Est AUC: 480    -Css min: 12.9    -Scr used: 1.44    -Wt used: TBW    -Vd: 0.72 -Will obtain levels around 4th or 5th dose if vancomycin continued   Will continue to monitor renal function and adjust dose as clinically indicated   Height: 5\' 6"  (167.6 cm) Weight: 58.4 kg (128 lb 12 oz) IBW/kg (Calculated) : 63.8  No data recorded.  Recent Labs  Lab 05/07/21 1446 05/08/21 0552 05/09/21 0701  WBC 16.6* 14.1* 9.5  CREATININE 1.71* 1.50* 1.44*  LATICACIDVEN 0.9  --   --      Estimated Creatinine Clearance: 39.4 mL/min (A) (by C-G formula based on SCr of 1.44 mg/dL (H)).    No Known Allergies  Antimicrobials this admission: 10/18 cefepime >>  10/18 vancomycin >>   Dose adjustments this admission: 10/19 vancomycin dosing by levels> 750mg  IV q24h  Microbiology results: 10/18 BCx: NG x2 days 10/18 MRSA PCR: ordered.   Thank you for allowing pharmacy to be a part of this patient's care.  Narda Rutherford, PharmD Pharmacy Resident  05/09/2021 10:42 AM

## 2021-05-09 NOTE — ED Notes (Signed)
Lunch meal tray given.  

## 2021-05-09 NOTE — Consult Note (Signed)
ANTICOAGULATION CONSULT NOTE  Pharmacy Consult for heparin Indication: chest pain/ACS  No Known Allergies  Patient Measurements: Height: 5\' 6"  (167.6 cm) Weight: 58.4 kg (128 lb 12 oz) IBW/kg (Calculated) : 63.8 Heparin Dosing Weight: 58.4kg  Vital Signs: BP: 85/63 (10/20 0700) Pulse Rate: 66 (10/20 0700)  Labs: Recent Labs    05/07/21 1446 05/07/21 1446 05/07/21 1714 05/08/21 0320 05/08/21 0552 05/08/21 1659 05/09/21 0701  HGB 9.9*  --   --   --  9.4*  --  8.1*  HCT 30.2*  --   --   --  28.5*  --  25.4*  PLT 275  --   --   --  263  --  238  APTT  --    < > 50*  --  48* 95* 62*  LABPROT 20.0*  --   --   --   --   --   --   INR 1.7*  --   --   --   --   --   --   HEPARINUNFRC  --   --   --   --  >1.10*  --  0.61  CREATININE 1.71*  --   --   --  1.50*  --  1.44*  TROPONINIHS 3,153*  --  3,506* 2,927* 2,894*  --   --    < > = values in this interval not displayed.     Estimated Creatinine Clearance: 39.4 mL/min (A) (by C-G formula based on SCr of 1.44 mg/dL (H)).   Medical History: Past Medical History:  Diagnosis Date   Bulging lumbar disc    Cancer ([place])    stage 4 lung cancer   Heart attack ([place])     Medications:  Scheduled:   budesonide (PULMICORT) nebulizer solution  0.5 mg Nebulization BID   feeding supplement  237 mL Oral TID BM   FLUoxetine  40 mg Oral Daily   furosemide  20 mg Intravenous Daily   ipratropium-albuterol  3 mL Nebulization Q4H   methylPREDNISolone (SOLU-MEDROL) injection  40 mg Intravenous Q12H   OLANZapine  10 mg Oral QHS   vancomycin variable dose per unstable renal function (pharmacist dosing)   Does not apply See admin instructions    Assessment: 70yo M with PMH of 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 Troponin's were  found to be elevated. Pharmacy was consulted for heparin dosing.   Apixaban noted on PTA med rec  Goal of Therapy:  Heparin level 0.3-0.7 units/ml aPTT 66-102 seconds Monitor platelets by anticoagulation protocol: Yes   Plan:  10/20@0701 : aPTT 62 sec, slightly subtherapeutic Bolus 850 units x1 and increase heparin infusion to 1000 units/hr recheck aPTT in 8 hours  Will check daily anti-Xa levels and switch to monitoring when correlating to aPTT Continue to monitor H&H and platelets with daily CBC while on heparin gtt   Jimmy West 05/09/2021,8:11 AM

## 2021-05-09 NOTE — Consult Note (Signed)
ANTICOAGULATION CONSULT NOTE  Pharmacy Consult for heparin Indication: chest pain/ACS  No Known Allergies  Patient Measurements: Height: 5\' 6"  (167.6 cm) Weight: 58.4 kg (128 lb 12 oz) IBW/kg (Calculated) : 63.8 Heparin Dosing Weight: 58.4kg  Vital Signs: BP: 90/60 (10/20 1630) Pulse Rate: 84 (10/20 1630)  Labs: Recent Labs    05/07/21 1446 05/07/21 1446 05/07/21 1714 05/08/21 0320 05/08/21 0552 05/08/21 1659 05/09/21 0701 05/09/21 1702  HGB 9.9*  --   --   --  9.4*  --  8.1*  --   HCT 30.2*  --   --   --  28.5*  --  25.4*  --   PLT 275  --   --   --  263  --  238  --   APTT  --    < > 50*  --  48* 95* 62* 50*  LABPROT 20.0*  --   --   --   --   --   --   --   INR 1.7*  --   --   --   --   --   --   --   HEPARINUNFRC  --   --   --   --  >1.10*  --  0.61  --   CREATININE 1.71*  --   --   --  1.50*  --  1.44*  --   TROPONINIHS 7,622*  --  3,506* 2,927* 2,894*  --   --   --    < > = values in this interval not displayed.     Estimated Creatinine Clearance: 39.4 mL/min (A) (by C-G formula based on SCr of 1.44 mg/dL (H)).   Medical History: Past Medical History:  Diagnosis Date   Bulging lumbar disc    Cancer ([employer])    stage 4 lung cancer   Heart attack ([place])     Medications:  Scheduled:   apixaban  5 mg Oral BID   atorvastatin  40 mg Oral Daily   budesonide (PULMICORT) nebulizer solution  0.5 mg Nebulization BID   feeding supplement  237 mL Oral TID BM   FLUoxetine  40 mg Oral Daily   [START ON 05/10/2021] furosemide  20 mg Oral Daily   ipratropium-albuterol  3 mL Nebulization Q4H   methylPREDNISolone (SOLU-MEDROL) injection  40 mg Intravenous Q12H   OLANZapine  10 mg Oral QHS    Assessment: 70yo M with PMH of 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 Troponin's were found to be elevated. Pharmacy was consulted for heparin dosing.   Apixaban noted on PTA med rec  10/20 1702 aPTT 50.   Goal of Therapy:  Heparin level 0.3-0.7 units/ml aPTT 66-102 seconds Monitor platelets by anticoagulation protocol: Yes   Plan:  aPTT is subtherapeutic. Will give a 800 unit bolus and increase heparin infusion to 1100 units/hr. NO labs ordered. Starting apixaban at 2200.   Oswald Hillock 05/09/2021,5:59 PM

## 2021-05-10 ENCOUNTER — Other Ambulatory Visit: Payer: Self-pay

## 2021-05-10 ENCOUNTER — Encounter: Payer: Self-pay | Admitting: Hospitalist

## 2021-05-10 ENCOUNTER — Encounter: Payer: Self-pay | Admitting: Pulmonary Disease

## 2021-05-10 DIAGNOSIS — J9601 Acute respiratory failure with hypoxia: Secondary | ICD-10-CM | POA: Diagnosis not present

## 2021-05-10 DIAGNOSIS — A419 Sepsis, unspecified organism: Secondary | ICD-10-CM | POA: Diagnosis not present

## 2021-05-10 DIAGNOSIS — R0602 Shortness of breath: Secondary | ICD-10-CM | POA: Diagnosis not present

## 2021-05-10 DIAGNOSIS — J189 Pneumonia, unspecified organism: Secondary | ICD-10-CM | POA: Diagnosis not present

## 2021-05-10 DIAGNOSIS — I214 Non-ST elevation (NSTEMI) myocardial infarction: Secondary | ICD-10-CM | POA: Diagnosis not present

## 2021-05-10 LAB — LIPID PANEL
Cholesterol: 121 mg/dL (ref 0–200)
HDL: 45 mg/dL (ref >40)
LDL Cholesterol: 61 mg/dL (ref 0–99)
Total CHOL/HDL Ratio: 2.7 RATIO
Triglycerides: 73 mg/dL (ref <150)
VLDL: 15 mg/dL (ref 0–40)

## 2021-05-10 LAB — CBC
HCT: 24.1 % — ABNORMAL LOW (ref 39.0–52.0)
Hemoglobin: 7.6 g/dL — ABNORMAL LOW (ref 13.0–17.0)
MCH: 30.2 pg (ref 26.0–34.0)
MCHC: 31.5 g/dL (ref 30.0–36.0)
MCV: 95.6 fL (ref 80.0–100.0)
Platelets: 228 10*3/uL (ref 150–400)
RBC: 2.52 MIL/uL — ABNORMAL LOW (ref 4.22–5.81)
RDW: 20.2 % — ABNORMAL HIGH (ref 11.5–15.5)
WBC: 14 10*3/uL — ABNORMAL HIGH (ref 4.0–10.5)
nRBC: 0 % (ref 0.0–0.2)

## 2021-05-10 LAB — BASIC METABOLIC PANEL
Anion gap: 11 (ref 5–15)
BUN: 36 mg/dL — ABNORMAL HIGH (ref 8–23)
CO2: 26 mmol/L (ref 22–32)
Calcium: 8.9 mg/dL (ref 8.9–10.3)
Chloride: 103 mmol/L (ref 98–111)
Creatinine, Ser: 1.74 mg/dL — ABNORMAL HIGH (ref 0.61–1.24)
GFR, Estimated: 42 mL/min — ABNORMAL LOW (ref >60)
Glucose, Bld: 156 mg/dL — ABNORMAL HIGH (ref 70–99)
Potassium: 4.5 mmol/L (ref 3.5–5.1)
Sodium: 140 mmol/L (ref 135–145)

## 2021-05-10 LAB — PREPARE RBC (CROSSMATCH)

## 2021-05-10 LAB — PHOSPHORUS: Phosphorus: 5 mg/dL — ABNORMAL HIGH (ref 2.5–4.6)

## 2021-05-10 LAB — MAGNESIUM: Magnesium: 2 mg/dL (ref 1.7–2.4)

## 2021-05-10 MED ORDER — CHLORHEXIDINE GLUCONATE CLOTH 2 % EX PADS
6.0000 | MEDICATED_PAD | Freq: Every day | CUTANEOUS | Status: DC
  Administered 2021-05-11 – 2021-06-12 (×33): 6 via TOPICAL

## 2021-05-10 MED ORDER — GUAIFENESIN ER 600 MG PO TB12
1200.0000 mg | ORAL_TABLET | Freq: Two times a day (BID) | ORAL | Status: DC
  Administered 2021-05-10 – 2021-06-01 (×43): 1200 mg via ORAL
  Filled 2021-05-10 (×46): qty 2

## 2021-05-10 MED ORDER — SODIUM CHLORIDE 0.9% IV SOLUTION: Freq: Once | INTRAVENOUS | Status: AC

## 2021-05-10 NOTE — Progress Notes (Signed)
Progress Note  Patient Name: Jimmy West Date of Encounter: 05/10/2021  Primary Cardiologist: Kate Sable, MD  Subjective   Breathing slightly improved.  No chest pain.  Inpatient Medications    Scheduled Meds:  apixaban  5 mg Oral BID   atorvastatin  40 mg Oral Daily   budesonide (PULMICORT) nebulizer solution  0.5 mg Nebulization BID   feeding supplement  237 mL Oral TID BM   FLUoxetine  40 mg Oral Daily   ipratropium-albuterol  3 mL Nebulization Q4H   methylPREDNISolone (SOLU-MEDROL) injection  40 mg Intravenous Q12H   OLANZapine  10 mg Oral QHS   Continuous Infusions:  ceFEPime (MAXIPIME) IV Stopped (05/10/21 0948)   PRN Meds: acetaminophen, ALPRAZolam, docusate sodium, guaiFENesin-dextromethorphan, ondansetron (ZOFRAN) IV, ondansetron, oxyCODONE, polyethylene glycol   Vital Signs    Vitals:   05/10/21 1015 05/10/21 1100 05/10/21 1200 05/10/21 1300  BP:  102/60 106/68 106/70  Pulse: 98 89 65 95  Resp: 20 15 15 16   Temp:      TempSrc:      SpO2: 99% 95% 97% 95%  Weight:      Height:        Intake/Output Summary (Last 24 hours) at 05/10/2021 1418 Last data filed at 05/10/2021 1217 Gross per 24 hour  Intake 100 ml  Output 600 ml  Net -500 ml   Filed Weights   05/07/21 1439 05/10/21 0901  Weight: 58.4 kg 63.2 kg    Physical Exam   GEN: Frail, in no acute distress.  HEENT: Grossly normal.  Neck: Supple, no JVD, carotid bruits, or masses. Cardiac: RRR, no murmurs, rubs, or gallops. No clubbing, cyanosis, edema.  Radials 2+, DP/PT 2+ and equal bilaterally.  Respiratory:  Respirations regular and unlabored, coarse breath sounds bilaterally. GI: Soft, nontender, nondistended, BS + x 4. MS: no deformity or atrophy. Skin: warm and dry, no rash. Neuro:  Strength and sensation are intact. Psych: AAOx3.  Normal affect.  Labs    Chemistry Recent Labs  Lab 05/07/21 1446 05/08/21 0552 05/09/21 0701 05/10/21 0517  NA 139 141 138 140   K 4.1 3.9 4.6 4.5  CL 101 103 103 103  CO2 27 26 26 26   GLUCOSE 120* 101* 129* 156*  BUN 27* 22 30* 36*  CREATININE 1.71* 1.50* 1.44* 1.74*  CALCIUM 8.7* 8.8* 8.4* 8.9  PROT 6.5  --   --   --   ALBUMIN 2.6*  --   --   --   AST 22  --   --   --   ALT 15  --   --   --   ALKPHOS 80  --   --   --   BILITOT 0.9  --   --   --   GFRNONAA 43* 50* 52* 42*  ANIONGAP 11 12 9 11      Hematology Recent Labs  Lab 05/08/21 0552 05/09/21 0701 05/10/21 0517  WBC 14.1* 9.5 14.0*  RBC 3.02* 2.69* 2.52*  HGB 9.4* 8.1* 7.6*  HCT 28.5* 25.4* 24.1*  MCV 94.4 94.4 95.6  MCH 31.1 30.1 30.2  MCHC 33.0 31.9 31.5  RDW 20.8* 20.4* 20.2*  PLT 263 238 228    Cardiac Enzymes  Recent Labs  Lab 04/24/21 2120 05/07/21 1446 05/07/21 1714 05/08/21 0320 05/08/21 0552  TROPONINIHS 179* 3,153* 3,506* 2,927* 2,894*      BNP Recent Labs  Lab 05/07/21 1446  BNP 1,105.5*     DDimer  Recent Labs  Lab  05/07/21 1446  DDIMER 1.70*     Lipids  Lab Results  Component Value Date   CHOL 121 05/10/2021   HDL 45 05/10/2021   LDLCALC 61 05/10/2021   TRIG 73 05/10/2021   CHOLHDL 2.7 05/10/2021   Radiology    CT Angio Chest PE W and/or Wo Contrast  Result Date: 05/07/2021 CLINICAL DATA:  Weakness and cough. History of metastatic lung cancer. EXAM: CT ANGIOGRAPHY CHEST WITH CONTRAST TECHNIQUE: Multidetector CT imaging of the chest was performed using the standard protocol during bolus administration of intravenous contrast. Multiplanar CT image reconstructions and MIPs were obtained to evaluate the vascular anatomy. CONTRAST:  36mL OMNIPAQUE IOHEXOL 350 MG/ML SOLN COMPARISON:  11/02/2019 FINDINGS: Cardiovascular: The heart is mildly enlarged but stable. No pericardial effusion. Stable tortuosity and calcification of the thoracic aorta but no aneurysm or dissection. The branch vessels are patent. Three-vessel coronary artery calcifications are noted. The right IJ power port is in unchanged position.  No complicating features. The pulmonary arterial tree is well opacified. No filling defects to suggest pulmonary embolism. Mediastinum/Nodes: No mediastinal or hilar mass or lymphadenopathy the. The esophagus is grossly. Lungs/Pleura: Stable underlying advanced emphysematous changes and areas of pulmonary scarring. Right lower lobe lung mass measures approximately 2.8 x 2.2 cm and previously measured 3.3 x 3.2 cm. Some surrounding interstitial scarring changes likely related to radiation. Peripheral right upper lobe lesion on image number 29/6 is stable measuring a maximum of 13 mm. Diffuse interstitial and airspace process in both lungs is most likely an infectious process. Aspiration would be a consideration. There is also a small right pleural effusion. Upper Abdomen: Few tiny low-attenuation hepatic lesions are stable. Stable splenomegaly. Stable right renal cyst. Musculoskeletal: No chest wall mass, supraclavicular or axillary adenopathy. The bony thorax is intact. No worrisome bone lesions. Stable severe compression deformity of T4 with vertebral plana appearance. Review of the MIP images confirms the above findings. IMPRESSION: 1. No CT findings for pulmonary embolism. 2. Stable tortuosity and calcification of the thoracic aorta but no dissection. 3. Interval decrease in size of the right lower lobe lung mass. The right upper lobe peripheral pulmonary nodule is stable. 4. Interstitial and airspace process in both lungs is likely an infectious process. Aspiration would be a consideration. Aortic Atherosclerosis (ICD10-I70.0) and Emphysema (ICD10-J43.9). Electronically Signed   By: Marijo Sanes M.D.   On: 05/07/2021 16:56   DG Chest Port 1 View  Result Date: 05/07/2021 CLINICAL DATA:  Sepsis.  History of right lower lobe lung cancer. EXAM: PORTABLE CHEST 1 VIEW COMPARISON:  Chest x-ray 04/24/2021.  PET-CT 01/03/2021 FINDINGS: The right IJ power port is in stable position. The cardiac silhouette,  mediastinal and hilar contours are stable. Extensive opacity in the right lower lobe most likely radiation fibrosis. New left lower lobe airspace process most consistent with pneumonia. No definite pleural effusions or pneumothorax. IMPRESSION: New left lower lobe airspace process most consistent with pneumonia. Extensive right lower lobe density likely radiation change. Electronically Signed   By: Marijo Sanes M.D.   On: 05/07/2021 16:06   Telemetry    RSR, 25 beats of NSVT overnight - Personally Reviewed  Cardiac Studies   2D Echocardiogram 10.19.2022   Reviewed by Dr. Garen Lah: EF 40-45% with mid to apical anteroseptal akinesis.  Patient Profile     70 year old male with a history of metastatic lung cancer on chemotherapy, prior PE on chronic Eliquis, anemia, stage III chronic kidney disease, and CAD status post PCI in 2005, who was  admitted on October 18 with ongoing dyspnea in the setting of left lower lobe pneumonia and non-STEMI (peak high-sensitivity troponin 3506)  Assessment & Plan    1.  Left lower lobe pneumonia/acute on chronic respiratory failure/sepsis: Recently admitted with right lower lobe pneumonia treated with antibiotics and discharged.  He continued to have dyspnea prompting representation with finding of left lower lobe pneumonia.  Antibiotic therapy, steroids, and nebulizers per medicine team.  He feels that breathing is slightly improved since presentation.  2.  Non-STEMI/CAD: Status post PCI in 2005.  In the setting of above, troponin peaked at 3506.  Echocardiogram this admission shows an EF of 40 to 45% with mid to apical anteroseptal akinesis.  No chest pain.  As previously noted, with active pneumonia, metastatic lung cancer, CKD 3, anemia, and frailty, he is a poor candidate for cath at this time.  Following recovery from respiratory failure, will plan outpatient Myoview for risk stratification.  Continue statin therapy.  No beta-blocker in the setting of soft  blood pressures and hypotension earlier this admission.  No aspirin in setting of anemia and chronic Eliquis therapy.  3.  Ischemic cardiomyopathy/HFrEF: EF 40 to 45% by echo this admission.  Appears euvolemic on exam.  Creatinine elevated on low-dose Lasix and I will hold this going forward.  No beta-blocker or ACE inhibitor/ARB/ARNI/MRA in the setting of soft blood pressures.  4.  Stage III chronic kidney disease: Creatinine up slightly to 1.74 from 1.44 in the setting of low-dose Lasix therapy.  Will hold.  5.  Normocytic anemia: H&H continues to drift down-7.6/24.1 this morning.  In the setting of non-STEMI, recommend transfusion to maintain hemoglobin greater than 8.  6.  History of pulmonary embolism: On chronic Eliquis therapy at home.  He did receive heparin x48 hours and Eliquis was resumed yesterday afternoon (October 20).  As previously noted, we will need to continue to monitor renal function closely as he is less than 60 kg and baseline creatinine today, appropriate Eliquis dose would be 2.5 mg twice daily instead of 5 mg twice daily.  Continue to follow trends prior to making decision.  7.  Lipids: LDL 61.  Moderate dose statin was added in the setting of #2.  8.  Hypotension: In the setting of #1/sepsis.  Pressures improved this morning-low 100s.  Going to hold Lasix due to rising creatinine.  9.  Lung cancer: Receives chemotherapy in the outpatient setting.   Signed, Murray Hodgkins, NP  05/10/2021, 2:18 PM    For questions or updates, please contact   Please consult [email or web address] for contact info under Cardiology/STEMI.

## 2021-05-10 NOTE — ED Notes (Signed)
Informed RN bed assigned 

## 2021-05-10 NOTE — Progress Notes (Signed)
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  He was recently discharged on 04/28/2021 on 2 L of supplemental oxygen after presenting with weakness and treated for aspiration pneumonia.  He states that he went home and afterwards he started feeling better very quickly.  He mainly started feeling weak and stayed in bed mostly and can do anything.  He is not initially wearing supplemental oxygen but when he became more short of breath he started using it.  He reports coughing but not bringing up any sputum.  Has been taking opiate pain meds for his back pain associated with tumor growth and also for whole body pain after his recent fall.  He presented to the ED and was hypoxic with saturation's in the 80s on room air.  Chest x-ray was done and showed a new left lower airspace process most consistent with pneumonia and extensive right lower lobe density changes likely radiation changes.  CTA of the chest was negative for PE.  He had elevated troponin and was diagnosed with NSTEMI and started on heparin drip.  Cardiology and pulmonary were both consulted as well as palliative care.  Breathing is improving slightly and cardiology is weaning him off the heparin drip for now.  We are continuing antibiotic therapy, steroids and nebulizers and cardiology been following for his NSTEMI and they feel that he was too sick for any current diagnostic studies and recommending outpatient Myoview  for restratification.  They are recommending continuing statin therapy but recommending no beta-blocker therapy in the setting of lower blood pressures hypotension early on inspection.  Cardiology feels that he is euvolemic and has held his Lasix going forward.  We will continue Eliquis 2.5 mg p.o. twice daily instead of 5 mg daily now for his history of PE.  Assessment & Plan:   Active Problems:   Acute hypoxemic respiratory failure (HCC)  Acute Hypoxic Respiratory failure sec. to LLL PNA with superimposed lung cancer -Patient recently was discharged on 2 L of oxygen at home for his previous hospitalization his lung cancer.  -Presented with hypoxia with SPO2 80% on room air. -SpO2: 95 % O2 Flow Rate (L/min): 6 L/min -Oxygen requirement increased to HFNC 8 L. Chest x-ray shows left lower lobe pneumonia. -Procalcitonin elevated 1.38 with new leukocytosis. -Continued vancomycin and cefepime, recently treated for aspiration pneumonia with IV Unasyn and then Augmentin. -Continue supplemental oxygen to keep saturation above 88% -Continue DuoNeb every 6 hours, Continue Solu-Medrol 40 mg q 12hr. -Continue BiPAP as night and as needed. -He will need an ambulatory home O2 screen and repeat chest x-ray prior to discharge   Severe sepsis POA sec.to PNA: -Presented with tachycardia, tachypnea, leukocytosis, AKI with source pneumonia -Continue IV antibiotics and currently getting IV cefepime, follow blood cultures. -Continue with budesonide 0.5 mg p.o. neb twice daily and continue with duo nebs every 4 scheduled as well as Guaifenesin 1200 mg po BID -C/w Methylprednisolone Sodium Succinate 40 mg q12h -We will add guaifenesin, flutter valve, and incentive spirometry -BC was 14.1  and trended down to 9.5 but now trended back up to 14.0 and is in the setting of steroid demargination -Sepsis physiology is improving -Blood cultures x2 show NGTD at 3 Days   NSTEMI -Troponin 3153 -> 3506 -> 2927 ->  2894 -Patient denies any chest pain.  EKG no ischemic changes -Continued heparin for 48 hours and transitioned back to apixaban 2D echocardiogram LVEF 40 to 45%.  RWMA -Cardiology consulted.  Recommended Myoview once pneumonia resolves.  That can be done outpatient. 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 daily -Repeat panel was checked and showed a total cholesterol/HDL ratio of 2.7, cholesterol level 121, HDL 45, LDL of 61, triglycerides of 73, VLDL 50 -Patient has no beta-blockers because of his hypotension and no ACE/ARB/Arni/MRA because of his AKI -Cardiology recommends holding his Lasix now and he currently appears euvolemic   AKI on CKD Stage 3a -Baseline serum creatinine 1.30, presented with 1.71 -Creatinine further trended down to [birth year]/1928/1.44 but then trended back up from yesterday's and today down to 36/1.74 in the setting of diuresis -Avoid further nephrotoxic medications, contrast dyes, hypotension renally dose medications -Cardiology is holding his further Lasix doses as he appears euvolemic   Hypotension: -Not on blood pressure medications at home. -Blood pressure seems like at baseline. -IV fluid hydration has now stopped and Lasix being held given worsening Cr -Continue to [place] BP per Protocol  -Last BP reading was 110/65   Stage IV lung cancer: -Patient has received radiation and chemotherapy  previously -Not on any due to poor clinical and functional status.   Chronic Normocytic Anemia -Due to antineoplastic therapy and chronic disease. -Hemoglobin/hematocrit has been trending down and patient's Hgb/Hct went from 9.4/28.5 -> 8.1/25.4 -> 7.6/24.1 -Check Anemia Panel in the AM -Cardiology recommending Transfusing 1 unit of pRBC to maintain Hgb/Hct >8 in the setting of NSTEMI -Continue to Monitor for S/Sx of Bleeding; No overt bleeding noted -Repeat CBC in the AM   Chronic pain syndrome: -Continue oxycodone and bowel regimen as needed.   Depression and Anxiety -Continue with Fluoxetine 40 g p.o. daily as well as Olanzapine 10 mg p.o. nightly   Hx of PE -Resume Anticoagulation with Apixaban after Heparin gtt  DVT prophylaxis: Anticoagulated with Apixaban  Code Status: DO NOT RESUSCITATE Family Communication: No family currently at bedside Disposition Plan: Pending further clinical improvement in his respiratory status and clearance by specialists including cardiology  Status is: Inpatient  Remains inpatient appropriate because: Continued supplemental oxygen use and he needs cardiac clearance prior to getting safe discharge disposition as well as evaluation by PT and OT    Consultants:  Pulmonary  Cardiology Palliative Care  Procedures:  ECHOCARDIOGRAM IMPRESSIONS     1. Left ventricular ejection fraction, by estimation, is 55 to 60%. The  left ventricle has normal function. The left ventricle has no regional  wall motion abnormalities. Left ventricular diastolic parameters are  consistent with Grade I diastolic  dysfunction (impaired relaxation).   2.  Right ventricular systolic function is normal. The right ventricular  size is normal.   3. The mitral valve is normal in structure. Mild mitral valve  regurgitation.   4. The aortic valve is normal in structure. Aortic valve regurgitation is  mild.   FINDINGS   Left Ventricle: Left ventricular ejection  fraction, by estimation, is 55  to 60%. The left ventricle has normal function. The left ventricle has no  regional wall motion abnormalities. The left ventricular internal cavity  size was normal in size. There is   no left ventricular hypertrophy. Left ventricular diastolic parameters  are consistent with Grade I diastolic dysfunction (impaired relaxation).   Right Ventricle: The right ventricular size is normal. No increase in  right ventricular wall thickness. Right ventricular systolic function is  normal.   Left Atrium: Left atrial size was normal in size.   Right Atrium: Right atrial size was normal in size.   Pericardium: There is no evidence of pericardial effusion.   Mitral Valve: The mitral valve is normal in structure. Mild mitral valve  regurgitation. MV peak gradient, 4.3 mmHg. The mean mitral valve gradient  is 2.0 mmHg.   Tricuspid Valve: The tricuspid valve is normal in structure. Tricuspid  valve regurgitation is mild.   Aortic Valve: The aortic valve is normal in structure. Aortic valve  regurgitation is mild. Aortic valve mean gradient measures 4.0 mmHg.  Aortic valve peak gradient measures 7.0 mmHg. Aortic valve area, by VTI  measures 3.72 cm.   Pulmonic Valve: The pulmonic valve was normal in structure. Pulmonic valve  regurgitation is not visualized.   Aorta: The aortic root and ascending aorta are structurally normal, with  no evidence of dilitation.   IAS/Shunts: No atrial level shunt detected by color flow Doppler.      LEFT VENTRICLE  PLAX 2D  LVIDd:         4.80 cm   Diastology  LVIDs:         3.20 cm   LV e' medial:    4.68 cm/s  LV PW:         0.90 cm   LV E/e' medial:  14.5  LV IVS:        0.70 cm   LV e' lateral:   4.68 cm/s  LVOT diam:     2.20 cm   LV E/e' lateral: 14.5  LV SV:         68  LV SV Index:   41  LVOT Area:     3.80 cm      LEFT ATRIUM             Index  LA diam:        3.10 cm 1.87 cm/m  LA Vol (A2C):   31.1 ml  18.75 ml/m  LA Vol (A4C):   34.6 ml 20.86 ml/m  LA Biplane Vol: 34.2 ml 20.62 ml/m   AORTIC VALVE                    PULMONIC VALVE  AV Area (Vmax):    3.37 cm     PV Vmax:       1.13 m/s  AV Area (Vmean):   3.31 cm     PV Vmean:      72.900 cm/s  AV Area (VTI):     3.72 cm     PV VTI:        0.180 m  AV Vmax:  132.00 cm/s  PV Peak grad:  5.1 mmHg  AV Vmean:          90.300 cm/s  PV Mean grad:  3.0 mmHg  AV VTI:            0.183 m  AV Peak Grad:      7.0 mmHg  AV Mean Grad:      4.0 mmHg  LVOT Vmax:         117.00 cm/s  LVOT Vmean:        78.700 cm/s  LVOT VTI:          0.179 m  LVOT/AV VTI ratio: 0.98     AORTA  Ao Root diam: 3.50 cm   MITRAL VALVE  MV Area (PHT): 8.43 cm    SHUNTS  MV Area VTI:   3.82 cm    Systemic VTI:  0.18 m  MV Peak grad:  4.3 mmHg    Systemic Diam: 2.20 cm  MV Mean grad:  2.0 mmHg  MV Vmax:       1.04 m/s  MV Vmean:      67.3 cm/s  MV Decel Time: 90 msec  MV E velocity: 67.70 cm/s  MV A velocity: 96.40 cm/s  MV E/A ratio:  0.70   Antimicrobials:  Anti-infectives (From admission, onward)    Start     Dose/Rate Route Frequency Ordered Stop   05/08/21 1800  vancomycin (VANCOREADY) IVPB 750 mg/150 mL  Status:  Discontinued        750 mg 150 mL/hr over 60 Minutes Intravenous Every 24 hours 05/08/21 1135 05/09/21 1448   05/08/21 0800  ceFEPIme (MAXIPIME) 2 g in sodium chloride 0.9 % 100 mL IVPB        2 g 200 mL/hr over 30 Minutes Intravenous Every 12 hours 05/07/21 1929     05/07/21 2200  ceFEPIme (MAXIPIME) 2 g in sodium chloride 0.9 % 100 mL IVPB  Status:  Discontinued        2 g 200 mL/hr over 30 Minutes Intravenous Every 12 hours 05/07/21 1927 05/07/21 1929   05/07/21 1930  vancomycin (VANCOREADY) IVPB 500 mg/100 mL        500 mg 100 mL/hr over 60 Minutes Intravenous  Once 05/07/21 1927 05/07/21 2144   05/07/21 1927  vancomycin variable dose per unstable renal function (pharmacist dosing)  Status:  Discontinued         Does not  apply See admin instructions 05/07/21 1927 05/09/21 1448   05/07/21 1600  vancomycin (VANCOCIN) IVPB 1000 mg/200 mL premix        1,000 mg 200 mL/hr over 60 Minutes Intravenous  Once 05/07/21 1552 05/07/21 1801   05/07/21 1600  ceFEPIme (MAXIPIME) 2 g in sodium chloride 0.9 % 100 mL IVPB        2 g 200 mL/hr over 30 Minutes Intravenous  Once 05/07/21 1552 05/07/21 1700        Subjective: Seen and examined at bedside and states that he is still short of breath but thinks he is getting little bit better.  No nausea or vomiting.  Denies any lightheadedness or dizziness.  Denies any current chest pain or other concerns or complaints at this time.  Objective: Vitals:   05/10/21 1519 05/10/21 1537 05/10/21 1546 05/10/21 1550  BP: 106/69  110/65   Pulse: 88  92   Resp: 20  18   Temp: 97.6 F (36.4 C)  97.9 F (36.6 C)   TempSrc:  SpO2: 96%  95% 95%  Weight:  64 kg    Height:        Intake/Output Summary (Last 24 hours) at 05/10/2021 1655 Last data filed at 05/10/2021 1217 Gross per 24 hour  Intake 100 ml  Output 600 ml  Net -500 ml   Filed Weights   05/07/21 1439 05/10/21 0901 05/10/21 1537  Weight: 58.4 kg 63.2 kg 64 kg   Examination: Physical Exam:  Constitutional: Thin chronically ill-appearing Caucasian male currently in no acute distress appears calm Eyes: Lids and conjunctivae normal, sclerae anicteric  ENMT: External Ears, Nose appear normal. Grossly normal hearing.  Neck: Appears normal, supple, no cervical masses, normal ROM, no appreciable thyromegaly; no appreciable JVD Respiratory: Diminished to auscultation bilaterally with coarse breath sounds, no wheezing, rales, rhonchi or crackles. Normal respiratory effort and patient is not tachypenic. No accessory muscle use.  Unlabored breathing but he is wearing supplemental oxygen via nasal cannula Cardiovascular: RRR, no murmurs / rubs / gallops. S1 and S2 auscultated.  Trace extremity edema Abdomen: Soft,  non-tender, non-distended. Bowel sounds positive.  GU: Deferred. Musculoskeletal: No clubbing / cyanosis of digits/nails. No joint deformity upper and lower extremities.  Skin: No rashes, lesions, ulcers on limited skin evaluation. No induration; Warm and dry.  Neurologic: CN 2-12 grossly intact with no focal deficits. Romberg sign and cerebellar reflexes not assessed.  Psychiatric: Normal judgment and insight. Alert and oriented x 3. Normal mood and appropriate affect.   Data Reviewed: I have personally reviewed following labs and imaging studies  CBC: Recent Labs  Lab 05/07/21 1446 05/08/21 0552 05/09/21 0701 05/10/21 0517  WBC 16.6* 14.1* 9.5 14.0*  NEUTROABS 14.2*  --   --   --   HGB 9.9* 9.4* 8.1* 7.6*  HCT 30.2* 28.5* 25.4* 24.1*  MCV 94.1 94.4 94.4 95.6  PLT 275 263 238 338   Basic Metabolic Panel: Recent Labs  Lab 05/07/21 1446 05/08/21 0552 05/09/21 0701 05/10/21 0517  NA 139 141 138 140  K 4.1 3.9 4.6 4.5  CL 101 103 103 103  CO2 27 26 26 26   GLUCOSE 120* 101* 129* 156*  BUN 27* 22 30* 36*  CREATININE 1.71* 1.50* 1.44* 1.74*  CALCIUM 8.7* 8.8* 8.4* 8.9  MG  --  1.8 2.1 2.0  PHOS  --   --   --  5.0*   GFR: Estimated Creatinine Clearance: 35.6 mL/min (A) (by C-G formula based on SCr of 1.74 mg/dL (H)). Liver Function Tests: Recent Labs  Lab 05/07/21 1446  AST 22  ALT 15  ALKPHOS 80  BILITOT 0.9  PROT 6.5  ALBUMIN 2.6*   No results for input(s): LIPASE, AMYLASE in the last 168 hours. No results for input(s): AMMONIA in the last 168 hours. Coagulation Profile: Recent Labs  Lab 05/07/21 1446  INR 1.7*   Cardiac Enzymes: No results for input(s): CKTOTAL, CKMB, CKMBINDEX, TROPONINI in the last 168 hours. BNP (last 3 results) No results for input(s): PROBNP in the last 8760 hours. HbA1C: No results for input(s): HGBA1C in the last 72 hours. CBG: No results for input(s): GLUCAP in the last 168 hours. Lipid Profile: Recent Labs    05/10/21 0500   CHOL 121  HDL 45  LDLCALC 61  TRIG 73  CHOLHDL 2.7   Thyroid Function Tests: No results for input(s): TSH, T4TOTAL, FREET4, T3FREE, THYROIDAB in the last 72 hours. Anemia Panel: No results for input(s): VITAMINB12, FOLATE, FERRITIN, TIBC, IRON, RETICCTPCT in the last 72 hours. Sepsis  Labs: Recent Labs  Lab 05/07/21 1446  PROCALCITON 1.38  LATICACIDVEN 0.9    Recent Results (from the past 240 hour(s))  Culture, blood (Routine x 2)     Status: None (Preliminary result)   Collection Time: 05/07/21  2:46 PM   Specimen: BLOOD  Result Value Ref Range Status   Specimen Description BLOOD PORTA CATH  Final   Special Requests   Final    BOTTLES DRAWN AEROBIC AND ANAEROBIC Blood Culture results may not be optimal due to an excessive volume of blood received in culture bottles   Culture   Final    NO GROWTH 3 DAYS Performed at [facility], [address], [place], [place] [address]    Report Status PENDING  Incomplete  Culture, blood (Routine x 2)     Status: None (Preliminary result)   Collection Time: 05/07/21  2:46 PM   Specimen: BLOOD  Result Value Ref Range Status   Specimen Description BLOOD RIGHT ANTECUBITAL  Final   Special Requests   Final    BOTTLES DRAWN AEROBIC AND ANAEROBIC Blood Culture results may not be optimal due to an excessive volume of blood received in culture bottles   Culture   Final    NO GROWTH 3 DAYS Performed at [facility], [address], [place], [place] [address]    Report Status PENDING  Incomplete  Resp Panel by RT-PCR (Flu A&B, Covid) Nasopharyngeal Swab     Status: None   Collection Time: 05/07/21  5:57 PM   Specimen: Nasopharyngeal Swab; Nasopharyngeal(NP) swabs in vial transport medium  Result Value Ref Range Status   SARS Coronavirus 2 by RT PCR NEGATIVE NEGATIVE Final    Comment: (NOTE) SARS-CoV-2 target nucleic acids are NOT DETECTED.  The SARS-CoV-2 RNA is generally detectable in upper respiratory specimens  during the acute phase of infection. The lowest concentration of SARS-CoV-2 viral copies this assay can detect is 138 copies/mL. A negative result does not preclude SARS-Cov-2 infection and should not be used as the sole basis for treatment or other patient management decisions. A negative result may occur with  improper specimen collection/handling, submission of specimen other than nasopharyngeal swab, presence of viral mutation(s) within the areas targeted by this assay, and inadequate number of viral copies(<138 copies/mL). A negative result must be combined with clinical observations, patient history, and epidemiological information. The expected result is Negative.  Fact Sheet for Patients:  EntrepreneurPulse.com.au  Fact Sheet for Healthcare Providers:  IncredibleEmployment.be  This test is no t yet approved or cleared by the [place] FDA and  has been authorized for detection and/or diagnosis of SARS-CoV-2 by FDA under an Emergency Use Authorization (EUA). This EUA will remain  in effect (meaning this test can be used) for the duration of the COVID-19 declaration under Section 564(b)(1) of the Act, 21 U.S.C.section 360bbb-3(b)(1), unless the authorization is terminated  or revoked sooner.       Influenza A by PCR NEGATIVE NEGATIVE Final   Influenza B by PCR NEGATIVE NEGATIVE Final    Comment: (NOTE) The Xpert Xpress SARS-CoV-2/FLU/RSV plus assay is intended as an aid in the diagnosis of influenza from Nasopharyngeal swab specimens and should not be used as a sole basis for treatment. Nasal washings and aspirates are unacceptable for Xpert Xpress SARS-CoV-2/FLU/RSV testing.  Fact Sheet for Patients: EntrepreneurPulse.com.au  Fact Sheet for Healthcare Providers: IncredibleEmployment.be  This test is not yet approved or cleared by the [place] FDA and has been authorized for detection  and/or diagnosis of SARS-CoV-2 by FDA under an Emergency Use Authorization (EUA). This EUA will remain in effect (meaning this test can be used) for the duration of the COVID-19 declaration under Section 564(b)(1) of the Act, 21 U.S.C. section 360bbb-3(b)(1), unless the authorization is terminated or revoked.  Performed at [facility], [address] [place], [place] [address]   MRSA Next Gen by PCR, Nasal     Status: None   Collection Time: 05/09/21  1:16 PM   Specimen: Nasal Mucosa; Nasal Swab  Result Value Ref Range Status   MRSA by PCR Next Gen NOT DETECTED NOT DETECTED Final    Comment: (NOTE) The GeneXpert MRSA Assay (FDA approved for NASAL specimens only), is one component of a comprehensive MRSA colonization surveillance program. It is not intended to diagnose MRSA infection nor to guide or monitor treatment for MRSA infections. Test performance is not FDA approved in patients less than 58 years old. Performed at [facility], [address] [place], [place] [address]     RN Pressure Injury Documentation:     Estimated body mass index is 22.77 kg/m as calculated from the following:   Height as of this encounter: 5\' 6"  (1.676 m).   Weight as of this encounter: 64 kg.  Malnutrition Type:   Malnutrition Characteristics:   Nutrition Interventions:    Radiology Studies: No results found.  Scheduled Meds:  apixaban  5 mg Oral BID   atorvastatin  40 mg Oral Daily   budesonide (PULMICORT) nebulizer solution  0.5 mg Nebulization BID   Chlorhexidine Gluconate Cloth  6 each Topical Daily   feeding supplement  237 mL Oral TID BM   FLUoxetine  40 mg Oral Daily   ipratropium-albuterol  3 mL Nebulization Q4H   methylPREDNISolone (SOLU-MEDROL) injection  40 mg Intravenous Q12H   OLANZapine  10 mg Oral QHS   Continuous Infusions:  ceFEPime (MAXIPIME) IV Stopped (05/10/21 0948)    LOS: 3 days   Kerney Elbe, DO [facility] PAGER  is on AMION  If 7PM-7AM, please contact night-coverage [email or web address]

## 2021-05-10 NOTE — Consult Note (Signed)
Pharmacy Antibiotic Note  Jimmy West is a 70 y.o. 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  Pharmacy has been consulted for cefepime dosing. Today is day 3 of ABX.   Afeb, WBC 16.6>14.1>9.5>14  Bcx NG x3 days  MRSA PCR not detected, vanc discontinued 10/21  Plan: Cefepime -Will continue cefepime 2g IV q12H   Will continue to monitor renal function and adjust dose as clinically indicated   Height: 5\' 6"  (167.6 cm) Weight: 63.2 kg (139 lb 5.3 oz) IBW/kg (Calculated) : 63.8  No data recorded.  Recent Labs  Lab 05/07/21 1446 05/08/21 0552 05/09/21 0701 05/10/21 0517  WBC 16.6* 14.1* 9.5 14.0*  CREATININE 1.71* 1.50* 1.44* 1.74*  LATICACIDVEN 0.9  --   --   --      Estimated Creatinine Clearance: 35.3 mL/min (A) (by C-G formula based on SCr of 1.74 mg/dL (H)).    No Known Allergies  Antimicrobials this admission: Ongoing 10/18 cefepime >>   Completed 10/18 vancomycin >> 10/21  Dose adjustments this admission: 10/19 vancomycin dosing by levels> 750mg  IV q24h  Microbiology results: 10/18 BCx: NG x3 days 10/18 MRSA PCR: not detected   Thank you for allowing pharmacy to be a part of this patient's care.  Narda Rutherford, PharmD Pharmacy Resident  05/10/2021 10:38 AM

## 2021-05-10 NOTE — ED Notes (Signed)
Pt resting quietly at this time, respirations equal and unlabored. Sats remain stable at this time.

## 2021-05-10 NOTE — ED Notes (Signed)
Repositioned in bed. Eating lunch.

## 2021-05-10 NOTE — ED Notes (Signed)
Pt moved into hospital bed. Pt eating lunch currently.

## 2021-05-10 NOTE — ED Notes (Signed)
Pt resting quietly at this time respirations equal and unlabored.

## 2021-05-11 ENCOUNTER — Inpatient Hospital Stay: Payer: Medicare HMO

## 2021-05-11 DIAGNOSIS — E43 Unspecified severe protein-calorie malnutrition: Secondary | ICD-10-CM

## 2021-05-11 DIAGNOSIS — N179 Acute kidney failure, unspecified: Secondary | ICD-10-CM

## 2021-05-11 DIAGNOSIS — I420 Dilated cardiomyopathy: Secondary | ICD-10-CM | POA: Diagnosis not present

## 2021-05-11 DIAGNOSIS — R5381 Other malaise: Secondary | ICD-10-CM | POA: Diagnosis not present

## 2021-05-11 DIAGNOSIS — Z7189 Other specified counseling: Secondary | ICD-10-CM

## 2021-05-11 DIAGNOSIS — J9601 Acute respiratory failure with hypoxia: Secondary | ICD-10-CM | POA: Diagnosis not present

## 2021-05-11 DIAGNOSIS — I25118 Atherosclerotic heart disease of native coronary artery with other forms of angina pectoris: Secondary | ICD-10-CM | POA: Diagnosis not present

## 2021-05-11 DIAGNOSIS — C3431 Malignant neoplasm of lower lobe, right bronchus or lung: Secondary | ICD-10-CM | POA: Diagnosis not present

## 2021-05-11 DIAGNOSIS — J189 Pneumonia, unspecified organism: Secondary | ICD-10-CM | POA: Diagnosis not present

## 2021-05-11 DIAGNOSIS — I214 Non-ST elevation (NSTEMI) myocardial infarction: Secondary | ICD-10-CM

## 2021-05-11 DIAGNOSIS — D638 Anemia in other chronic diseases classified elsewhere: Secondary | ICD-10-CM

## 2021-05-11 LAB — CBC WITH DIFFERENTIAL/PLATELET
Abs Immature Granulocytes: 0.08 10*3/uL — ABNORMAL HIGH (ref 0.00–0.07)
Basophils Absolute: 0 10*3/uL (ref 0.0–0.1)
Basophils Relative: 0 %
Eosinophils Absolute: 0 10*3/uL (ref 0.0–0.5)
Eosinophils Relative: 0 %
HCT: 28.6 % — ABNORMAL LOW (ref 39.0–52.0)
Hemoglobin: 9.3 g/dL — ABNORMAL LOW (ref 13.0–17.0)
Immature Granulocytes: 1 %
Lymphocytes Relative: 1 %
Lymphs Abs: 0.2 10*3/uL — ABNORMAL LOW (ref 0.7–4.0)
MCH: 30.9 pg (ref 26.0–34.0)
MCHC: 32.5 g/dL (ref 30.0–36.0)
MCV: 95 fL (ref 80.0–100.0)
Monocytes Absolute: 0.3 10*3/uL (ref 0.1–1.0)
Monocytes Relative: 2 %
Neutro Abs: 14 10*3/uL — ABNORMAL HIGH (ref 1.7–7.7)
Neutrophils Relative %: 96 %
Platelets: 227 10*3/uL (ref 150–400)
RBC: 3.01 MIL/uL — ABNORMAL LOW (ref 4.22–5.81)
RDW: 19.6 % — ABNORMAL HIGH (ref 11.5–15.5)
WBC: 14.5 10*3/uL — ABNORMAL HIGH (ref 4.0–10.5)
nRBC: 0 % (ref 0.0–0.2)

## 2021-05-11 LAB — COMPREHENSIVE METABOLIC PANEL
ALT: 19 U/L (ref 0–44)
AST: 20 U/L (ref 15–41)
Albumin: 2.3 g/dL — ABNORMAL LOW (ref 3.5–5.0)
Alkaline Phosphatase: 74 U/L (ref 38–126)
Anion gap: 9 (ref 5–15)
BUN: 42 mg/dL — ABNORMAL HIGH (ref 8–23)
CO2: 28 mmol/L (ref 22–32)
Calcium: 8.9 mg/dL (ref 8.9–10.3)
Chloride: 105 mmol/L (ref 98–111)
Creatinine, Ser: 1.39 mg/dL — ABNORMAL HIGH (ref 0.61–1.24)
GFR, Estimated: 55 mL/min — ABNORMAL LOW (ref >60)
Glucose, Bld: 131 mg/dL — ABNORMAL HIGH (ref 70–99)
Potassium: 4.3 mmol/L (ref 3.5–5.1)
Sodium: 142 mmol/L (ref 135–145)
Total Bilirubin: 0.8 mg/dL (ref 0.3–1.2)
Total Protein: 5.5 g/dL — ABNORMAL LOW (ref 6.5–8.1)

## 2021-05-11 LAB — TYPE AND SCREEN
ABO/RH(D): O POS
Antibody Screen: NEGATIVE
Unit division: 0

## 2021-05-11 LAB — BPAM RBC
Blood Product Expiration Date: 202211162359
ISSUE DATE / TIME: 202210212319
Unit Type and Rh: 5100

## 2021-05-11 LAB — MAGNESIUM: Magnesium: 2 mg/dL (ref 1.7–2.4)

## 2021-05-11 LAB — PHOSPHORUS: Phosphorus: 4.8 mg/dL — ABNORMAL HIGH (ref 2.5–4.6)

## 2021-05-11 MED ORDER — SALINE SPRAY 0.65 % NA SOLN
1.0000 | NASAL | Status: DC | PRN
  Filled 2021-05-11 (×2): qty 44

## 2021-05-11 MED ORDER — SODIUM CHLORIDE 0.9 % IV SOLN
500.0000 mg | INTRAVENOUS | Status: AC
  Administered 2021-05-11 – 2021-05-13 (×3): 500 mg via INTRAVENOUS
  Filled 2021-05-11 (×3): qty 500

## 2021-05-11 NOTE — Evaluation (Signed)
Occupational Therapy Evaluation Patient Details Name: Jimmy West MRN: [id number] DOB: 1951/02/27 Today's Date: 05/11/2021   History of Present Illness 70 y.o. male was brought to hosp to admit 10/18 for hypoxia, weakness, SOB and noted sepsis from PNA, as well as NSTEMI.  Troponins are high from demand ischemia, EF 40-45%, and has recent asp PNA per chart.  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 Pt reports he lives alone, has intermittent assistance from friends. Pt reports worsening generalized weakness, affecting completion of safe, independent ADL/IADL compared to PLOF. SPO2 at start of evaluation was 88% on 6L [place], RN notified, RN increased to 8L, pt at 90-91% at rest, HR 98. Pt participated in grooming at bed level with SET UP (washing face), requested to reposition in bed due to pain in back. Pt transferred supine<>sit with MIN A, STS with MIN A, two steps to the left up the bed with MIN A. Moniotored SPo2 throughout. When pt returned to bed, SPO2 was 75%, HR 111. Recover to 88%, HR at 100 after approx 3 minutes, RN and MD aware. Pt does not endorse SOB and reports he feels "better than before". Pt educated on energy conservation techniques, role of rehab and recommendations. All questions answered. Pt presents with deficits in generalized strength and endurance, affecting safe and independent ADL/IADL completion. Recommend discharge to SNF at this time in order to improve performance in functional tasks with required compensatory methods. Pt is left in bed, RN present. OT will continue to follow while admitted.      Recommendations for follow up therapy are one component of a multi-disciplinary discharge planning process, led by the attending  physician.  Recommendations may be updated based on patient status, additional functional criteria and insurance authorization.   Follow Up Recommendations  SNF    Equipment Recommendations  3 in 1 bedside commode    Recommendations for Other Services       Precautions / Restrictions Precautions Precautions: Fall Precaution Comments: monitor sats and HR Restrictions Weight Bearing Restrictions: No Other Position/Activity Restrictions: upright best for his hypoxia      Mobility Bed Mobility Overal bed mobility: Needs Assistance Bed Mobility: Supine to Sit;Sit to Supine     Supine to sit: Min assist Sit to supine: Min assist   General bed mobility comments: HOB raised Patient Response: Cooperative  Transfers Overall transfer level: Needs assistance   Transfers: Sit to/from Stand Sit to Stand: Min assist         General transfer comment: anterior LOB with STS, corrected with tactile cueing from therapist; Stood to adjust positioning in bed per pt request    Balance Overall balance assessment: Needs assistance Sitting-balance support: Feet supported Sitting balance-Leahy Scale: Fair Sitting balance - Comments: o2 sats 88-90 in sitting for less than 1 minute   Standing balance support: No upper extremity supported Standing balance-Leahy Scale: Poor Standing balance comment: brief STS and two steps to the L up the bed for repositioning                           ADL either performed or assessed with clinical judgement   ADL Overall ADL's : Needs assistance/impaired     Grooming: Set up;Bed level;Wash/dry face  Grooming Details (indicate cue type and reason): due to SOB             Lower Body Dressing: Maximal assistance Lower Body Dressing Details (indicate cue type and reason): socks               General ADL Comments: limited in ADL completion due to fatigue. RN aware and adjusted O2 as appropriate.     Vision Baseline  Vision/History: 1 Wears glasses       Perception     Praxis      Pertinent Vitals/Pain Pain Assessment: No/denies pain     Hand Dominance Right   Extremity/Trunk Assessment Upper Extremity Assessment Upper Extremity Assessment: RUE deficits/detail;Generalized weakness;LUE deficits/detail RUE Deficits / Details: general weakness affecting FMC/dexterity, grip strength LUE Deficits / Details: general weakness affecting FMC/dexterity, grip strength   Lower Extremity Assessment Lower Extremity Assessment: Generalized weakness   Cervical / Trunk Assessment Cervical / Trunk Assessment: Normal   Communication Communication Communication: No difficulties   Cognition Arousal/Alertness: Awake/alert Behavior During Therapy: WFL for tasks assessed/performed Overall Cognitive Status: Within Functional Limits for tasks assessed                                 General Comments: Pt is A&Ox4, approrpiate throughout evaluation   General Comments  pt with slow spo2 recovery following minimal mobility    Exercises Exercises: Other exercises (LE strength 4+) Other Exercises Other Exercises: Moniotored SPo2 throughout. 85% at rest with 6L; RN increased to 8L, pt at 90-91% at rest, HR 98. Pt requested to reposition in bed, came to EOB, performed STS, returned to bed with O2 at 75%, HR 111. Recover to 88% after approx 3 minutes, RN and MD notified.   Shoulder Instructions      Home Living Family/patient expects to be discharged to:: Private residence Living Arrangements: Non-relatives/Friends Available Help at Discharge: Family;Friend(s);Available PRN/intermittently Type of Home: Mobile home Home Access: Stairs to enter Entrance Stairs-Number of Steps: 5 Entrance Stairs-Rails: Right;Left;Can reach both Home Layout: One level     Bathroom Shower/Tub: Occupational psychologist: Handicapped height     Home Equipment: Cane - quad   Additional Comments: fell  before quad cane was obtained but does not feel secure on it      Prior Functioning/Environment Level of Independence: Independent with assistive device(s)        Comments: unsteady on quad caen        OT Problem List: Decreased strength;Cardiopulmonary status limiting activity;Decreased activity tolerance      OT Treatment/Interventions: Self-care/ADL training;Therapeutic exercise;Energy conservation;DME and/or AE instruction;Patient/family education;Modalities;Therapeutic activities;Balance training    OT Goals(Current goals can be found in the care plan section) Acute Rehab OT Goals Patient Stated Goal: to go home OT Goal Formulation: With patient Time For Goal Achievement: 05/25/21 Potential to Achieve Goals: Good  OT Frequency: Min 1X/week   Barriers to D/C: Decreased caregiver support          Co-evaluation              AM-PAC OT "6 Clicks" Daily Activity     Outcome Measure Help from another person eating meals?: None Help from another person taking care of personal grooming?: A Little Help from another person toileting, which includes using toliet, bedpan, or urinal?: A Little Help from another person bathing (including washing, rinsing, drying)?: A Little Help from another person to put on and  taking off regular upper body clothing?: A Little Help from another person to put on and taking off regular lower body clothing?: A Lot 6 Click Score: 18   End of Session Equipment Utilized During Treatment: Oxygen Nurse Communication: Mobility status;Other (comment) (oxygen saturation)  Activity Tolerance: Patient limited by fatigue Patient left: in bed;with call bell/phone within reach;with bed alarm set  OT Visit Diagnosis: Other abnormalities of gait and mobility (R26.89);Muscle weakness (generalized) (M62.81)                Time: 8367-2550 OT Time Calculation (min): 23 min Charges:  OT General Charges $OT Visit: 1 Visit OT Evaluation $OT Eval Low  Complexity: 1 Low OT Treatments $Self Care/Home Management : 8-22 mins  Shanon Payor, OTD OTR/L  05/11/21, 4:06 PM

## 2021-05-11 NOTE — Progress Notes (Signed)
Progress Note  Patient Name: Jimmy West Date of Encounter: 05/11/2021  Primary Cardiologist: Kate Sable, MD  Subjective   Feeling much better this morning.  Not quite back to baseline however.  Coughing some and mobilizing secretions.  Denies chest pain.  Inpatient Medications    Scheduled Meds:  apixaban  5 mg Oral BID   atorvastatin  40 mg Oral Daily   budesonide (PULMICORT) nebulizer solution  0.5 mg Nebulization BID   Chlorhexidine Gluconate Cloth  6 each Topical Daily   feeding supplement  237 mL Oral TID BM   FLUoxetine  40 mg Oral Daily   guaiFENesin  1,200 mg Oral BID   ipratropium-albuterol  3 mL Nebulization Q4H   methylPREDNISolone (SOLU-MEDROL) injection  40 mg Intravenous Q12H   OLANZapine  10 mg Oral QHS   Continuous Infusions:  ceFEPime (MAXIPIME) IV 2 g (05/11/21 0809)   PRN Meds: acetaminophen, ALPRAZolam, docusate sodium, guaiFENesin-dextromethorphan, ondansetron (ZOFRAN) IV, ondansetron, oxyCODONE, polyethylene glycol   Vital Signs    Vitals:   05/11/21 0300 05/11/21 0420 05/11/21 0726 05/11/21 0746  BP:  99/72  120/74  Pulse:  (!) 54  91  Resp:  18  18  Temp:  (!) 97.5 F (36.4 C)  98 F (36.7 C)  TempSrc:      SpO2: 98% 95% 90% 92%  Weight:      Height:        Intake/Output Summary (Last 24 hours) at 05/11/2021 1030 Last data filed at 05/11/2021 1000 Gross per 24 hour  Intake 1910 ml  Output 2550 ml  Net -640 ml   Filed Weights   05/07/21 1439 05/10/21 0901 05/10/21 1537  Weight: 58.4 kg 63.2 kg 64 kg    Physical Exam   GEN: Frail, in no acute distress.  HEENT: Grossly normal.  Neck: Supple, no JVD, carotid bruits, or masses. Cardiac: RRR, no murmurs, rubs, or gallops. No clubbing, cyanosis, edema.  Radials 2+, DP/PT 2+ and equal bilaterally.  Respiratory:  Respirations regular and unlabored, left basilar crackles, otherwise diminished breath sounds throughout. GI: Soft, nontender, nondistended, BS + x 4. MS:  no deformity or atrophy. Skin: warm and dry, no rash. Neuro:  Strength and sensation are intact. Psych: AAOx3.  Normal affect.  Labs    Chemistry Recent Labs  Lab 05/07/21 1446 05/08/21 0552 05/09/21 0701 05/10/21 0517 05/11/21 0555  NA 139   < > 138 140 142  K 4.1   < > 4.6 4.5 4.3  CL 101   < > 103 103 105  CO2 27   < > 26 26 28   GLUCOSE 120*   < > 129* 156* 131*  BUN 27*   < > 30* 36* 42*  CREATININE 1.71*   < > 1.44* 1.74* 1.39*  CALCIUM 8.7*   < > 8.4* 8.9 8.9  PROT 6.5  --   --   --  5.5*  ALBUMIN 2.6*  --   --   --  2.3*  AST 22  --   --   --  20  ALT 15  --   --   --  19  ALKPHOS 80  --   --   --  74  BILITOT 0.9  --   --   --  0.8  GFRNONAA 43*   < > 52* 42* 55*  ANIONGAP 11   < > 9 11 9    < > = values in this interval not displayed.  Hematology Recent Labs  Lab 05/09/21 0701 05/10/21 0517 05/11/21 0555  WBC 9.5 14.0* 14.5*  RBC 2.69* 2.52* 3.01*  HGB 8.1* 7.6* 9.3*  HCT 25.4* 24.1* 28.6*  MCV 94.4 95.6 95.0  MCH 30.1 30.2 30.9  MCHC 31.9 31.5 32.5  RDW 20.4* 20.2* 19.6*  PLT 238 228 227    Cardiac Enzymes  Recent Labs  Lab 04/24/21 2120 05/07/21 1446 05/07/21 1714 05/08/21 0320 05/08/21 0552  TROPONINIHS 179* 3,153* 3,506* 2,927* 2,894*      BNP Recent Labs  Lab 05/07/21 1446  BNP 1,105.5*     DDimer  Recent Labs  Lab 05/07/21 1446  DDIMER 1.70*     Lipids  Lab Results  Component Value Date   CHOL 121 05/10/2021   HDL 45 05/10/2021   LDLCALC 61 05/10/2021   TRIG 73 05/10/2021   CHOLHDL 2.7 05/10/2021   Radiology    CT Angio Chest PE W and/or Wo Contrast  Result Date: 05/07/2021 CLINICAL DATA:  Weakness and cough. History of metastatic lung cancer. EXAM: CT ANGIOGRAPHY CHEST WITH CONTRAST TECHNIQUE: Multidetector CT imaging of the chest was performed using the standard protocol during bolus administration of intravenous contrast. Multiplanar CT image reconstructions and MIPs were obtained to evaluate the vascular  anatomy. CONTRAST:  23mL OMNIPAQUE IOHEXOL 350 MG/ML SOLN COMPARISON:  11/02/2019 FINDINGS: Cardiovascular: The heart is mildly enlarged but stable. No pericardial effusion. Stable tortuosity and calcification of the thoracic aorta but no aneurysm or dissection. The branch vessels are patent. Three-vessel coronary artery calcifications are noted. The right IJ power port is in unchanged position. No complicating features. The pulmonary arterial tree is well opacified. No filling defects to suggest pulmonary embolism. Mediastinum/Nodes: No mediastinal or hilar mass or lymphadenopathy the. The esophagus is grossly. Lungs/Pleura: Stable underlying advanced emphysematous changes and areas of pulmonary scarring. Right lower lobe lung mass measures approximately 2.8 x 2.2 cm and previously measured 3.3 x 3.2 cm. Some surrounding interstitial scarring changes likely related to radiation. Peripheral right upper lobe lesion on image number 29/6 is stable measuring a maximum of 13 mm. Diffuse interstitial and airspace process in both lungs is most likely an infectious process. Aspiration would be a consideration. There is also a small right pleural effusion. Upper Abdomen: Few tiny low-attenuation hepatic lesions are stable. Stable splenomegaly. Stable right renal cyst. Musculoskeletal: No chest wall mass, supraclavicular or axillary adenopathy. The bony thorax is intact. No worrisome bone lesions. Stable severe compression deformity of T4 with vertebral plana appearance. Review of the MIP images confirms the above findings. IMPRESSION: 1. No CT findings for pulmonary embolism. 2. Stable tortuosity and calcification of the thoracic aorta but no dissection. 3. Interval decrease in size of the right lower lobe lung mass. The right upper lobe peripheral pulmonary nodule is stable. 4. Interstitial and airspace process in both lungs is likely an infectious process. Aspiration would be a consideration. Aortic Atherosclerosis  (ICD10-I70.0) and Emphysema (ICD10-J43.9). Electronically Signed   By: Marijo Sanes M.D.   On: 05/07/2021 16:56   DG Chest Port 1 View  Result Date: 05/11/2021 CLINICAL DATA:  Shortness of breath in a 70 year old male with history of pulmonary neoplasm. EXAM: PORTABLE CHEST 1 VIEW COMPARISON:  Most recent chest x-ray from May 07, 2021 and CT of the chest from May 07, 2021. FINDINGS: Diffuse interstitial and airspace opacities in the mid and lower lungs as on the recent chest x-ray. RIGHT-sided Port-A-Cath terminates in the RIGHT atrium. Cardiomediastinal contours are stable. No visible pneumothorax.  No gross effusion. On limited assessment there is no acute skeletal process. IMPRESSION: Diffuse interstitial and airspace opacities in the mid and lower lungs, similar to the recent chest x-ray. May be post infectious fibrotic changes or ongoing pneumonia. Would also correlate with any risk factors for pneumonitis that could be related to ongoing therapy. Electronically Signed   By: Zetta Bills M.D.   On: 05/11/2021 08:14   DG Chest Port 1 View  Result Date: 05/07/2021 CLINICAL DATA:  Sepsis.  History of right lower lobe lung cancer. EXAM: PORTABLE CHEST 1 VIEW COMPARISON:  Chest x-ray 04/24/2021.  PET-CT 01/03/2021 FINDINGS: The right IJ power port is in stable position. The cardiac silhouette, mediastinal and hilar contours are stable. Extensive opacity in the right lower lobe most likely radiation fibrosis. New left lower lobe airspace process most consistent with pneumonia. No definite pleural effusions or pneumothorax. IMPRESSION: New left lower lobe airspace process most consistent with pneumonia. Extensive right lower lobe density likely radiation change. Electronically Signed   By: Marijo Sanes M.D.   On: 05/07/2021 16:06   Telemetry    Regular sinus rhythm, no recurrent nonsustained VT- Personally Reviewed  Cardiac Studies   2D Echocardiogram 10.19.2022   Reviewed by Dr.  Garen Lah: EF 40-45% with mid to apical anteroseptal akinesis.  Patient Profile   70 year old male with a history of metastatic lung cancer on chemotherapy, prior PE on chronic Eliquis, anemia, stage III chronic kidney disease, and CAD status post PCI in 2005, who was admitted on October 18 with ongoing dyspnea in the setting of left lower lobe pneumonia and non-STEMI (peak high-sensitivity troponin 3506)  Assessment & Plan    1.  Left lower lobe pneumonia/acute on chronic respiratory failure/sepsis: Resubmitted with right lower lobe pneumonia, treated with antibiotics, and discharge.  Unfortunately, he continued to have dyspnea prompting representation with finding of left lower lobe pneumonia.  Antibiotic therapy, steroids, nebulizers per medicine team.  He seems to be feeling much better this morning, though not quite back to baseline.  Coughing and mobilizing secretions.  2.  Non-STEMI/CAD: Status post PCI 2005.  In the setting of above, troponin peaked at 3506.  Echo this admission shows an EF of 40 to 45% with mid to apical anteroseptal akinesis.  He has not had chest pain.  As previously noted, with active pneumonia, metastatic lung cancer, stage III chronic kidney disease, anemia, and frailty, he is a poor candidate for catheterization at this time.  We will consider outpatient Myoview for risk factor stratification following recovery.  Continue statin therapy.  No beta-blocker in the setting of soft blood pressures and hypotension earlier this admission.  No aspirin in the setting of anemia and chronic Eliquis.  3.  Ischemic cardiomyopathy/HFrEF: EF 40 to 45% by echo this admission.  Remains euvolemic on examination.  Lasix was discontinued yesterday in the setting of rising creatinine.  No beta-blocker or ACE/ARB/Entresto/MRA in the setting of soft blood pressures.  4.  Stage III chronic kidney disease: Lasix discontinued October 21.  Creatinine 1.39 this morning.  5.  Normocytic anemia:  Transfused 1 unit yesterday and H&H improved to 9.3 and 28.6.  No overt bleeding.  Continue to follow.  6.  History of pulmonary embolism: On chronic Eliquis therapy at home.  Creatinine improved this morning.  Remains on 5 mg twice daily.  Will need to continue to follow renal function closely.  7.  Hyperlipidemia: LDL 61.  Remains on moderate dose statin in the setting of non-STEMI.  8.  Hypotension: In setting of #1/sepsis.  Pressures have been stable in the 90s.  9.  Lung cancer: Receives chemotherapy in the outpatient setting.  Signed, Murray Hodgkins, NP  05/11/2021, 10:30 AM    For questions or updates, please contact   Please consult [email or web address] for contact info under Cardiology/STEMI.

## 2021-05-11 NOTE — Progress Notes (Signed)
PROGRESS NOTE  Jimmy West YIR:[id number] DOB: 03-16-1951   PCP: Pcp, No  Patient is from: Home.  Uses rolling walker at baseline.  DOA: 05/07/2021 LOS: 4  Chief complaints:  Chief Complaint  Patient presents with   Shortness of Breath     Brief Narrative / Interim history: 70 year old M with PMH of stage IV RLL lung cancer, cancer pain, CAD/stent in 2005, chronic respiratory failure with hypoxia on 2 L, anemia of chronic disease, anxiety, depression and debility presenting with generalized weakness, shortness of breath and dry cough, and admitted for acute on chronic hypoxic respiratory failure in the setting of LLL pneumonia and non-STEMI.  Started on broad-spectrum antibiotics, steroid and IV heparin.  Cardiology consulted recommended medical management and [place] outpatient.  CTA chest negative for PE.  Continues to require up to 6 L by [place] at rest.  Subjective: Seen and examined earlier this morning.  No major events overnight of this morning.  Still with significant shortness of breath but better than when he came in.  Cough is starting to get loose with clear phlegm.  He denies hemoptysis.  He denies chest pain, GI or UTI symptoms.  Objective: Vitals:   05/11/21 1106 05/11/21 1119 05/11/21 1212 05/11/21 1536  BP:  (!) 96/59 99/65 117/71  Pulse:  98 92 (!) 103  Resp:  18 18 18   Temp:  98.1 F (36.7 C)  98.3 F (36.8 C)  TempSrc:    Oral  SpO2: 94% 97%  91%  Weight:      Height:        Intake/Output Summary (Last 24 hours) at 05/11/2021 1551 Last data filed at 05/11/2021 1511 Gross per 24 hour  Intake 2390 ml  Output 2850 ml  Net -460 ml   Filed Weights   05/07/21 1439 05/10/21 0901 05/10/21 1537  Weight: 58.4 kg 63.2 kg 64 kg    Examination:  GENERAL: Appears frail.  No apparent distress. HEENT: MMM.  Vision and hearing grossly intact.  NECK: Supple.  No apparent JVD.  RESP: 95% on 6 L.  Notable work of breathing as he speaks.  Bibasilar rales  with diminished aeration. CVS:  RRR. Heart sounds normal.  ABD/GI/GU: BS+. Abd soft, NTND.  MSK/EXT:  Moves extremities. No apparent deformity. No edema.  SKIN: no apparent skin lesion or wound NEURO: Awake, alert and oriented appropriately.  No apparent focal neuro deficit. PSYCH: Calm. Normal affect.   Procedures:  None  Microbiology summarized: [id number] and influenza PCR nonreactive. MRSA PCR screen negative. Blood cultures NGTD.  Assessment & Plan: Acute on chronic respiratory failure with hypoxia due to LLL pneumonia with superimposed stage IV RLL lung cancer-on 2 L at baseline.  Currently requiring 6 L.  CTA chest negative for PE but concern for LLL pneumonia with RLL scarring from known lung cancer.  Pro-Cal elevated to 1.38.  Blood cultures negative. -Vancomycin discontinued.  MRSA PCR screen negative. -Continue IV cefepime, IV Solu-Medrol and nebulizers.   -Add azithromycin for atypical coverage -Wean oxygen as able. -IS/OOB/PT/OT  Severe sepsis due to pneumonia:tachycardia, tachypnea, leukocytosis, AKI with pneumonia POA -Management as above.    NSTEMI/history of CAD/stent in 2005: Troponin 3153 -> 3506 -> 2927 -> 2894.  EKG with T wave changes in lateral leads but no dynamic changes.  TTE with LVEF of 55 to 60%, G1-DD and no RWMA chest pain-free. -Cardiology following -Completed 48 hours of IV heparin and transition back to home Eliquis. -Medical management due to poor candidacy for  heart cath in the setting of respiratory failure -[place] outpatient.    Chronic diastolic CHF: TTE as above.  Appears euvolemic on exam.  Does not seem to be on diuretics at home. -Monitor fluid status   AKI/azotemia on CKD-3A: Improved.  Does not seem to be on nephrotoxic meds.  Could be hemodynamically mediated Recent Labs    04/24/21 1042 04/25/21 0513 04/26/21 0610 04/27/21 0452 04/28/21 0541 05/07/21 1446 05/08/21 0552 05/09/21 0701 05/10/21 0517 05/11/21 0555  BUN 23 19  24* 32* 31* 27* 22 30* 36* 42*  CREATININE 1.36* 1.11 1.24 1.17 1.30* 1.71* 1.50* 1.44* 1.74* 1.39*  -Continue monitoring -Avoid nephrotoxic meds.  Hypotension: Resolved.   Stage IV lung cancer s/p radiation and chemotherapy previously.  Not on treatment anymore likely due to poor clinical and functional status -Outpatient follow-up   Anemia of chronic disease: H&H stable after 1 unit Recent Labs    04/24/21 2120 04/25/21 0513 04/26/21 0610 04/27/21 0452 04/28/21 0541 05/07/21 1446 05/08/21 0552 05/09/21 0701 05/10/21 0517 05/11/21 0555  HGB 7.4* 9.1* 8.8* 8.3* 9.7* 9.9* 9.4* 8.1* 7.6* 9.3*  -Continue monitoring   Chronic pain syndrome: -Continue oxycodone and bowel regimen as needed.   Depression and Anxiety: Stable -Continue with Fluoxetine 40 g p.o. daily as well as Olanzapine 10 mg p.o. nightly   Hx of PE: CTA chest negative for PE. -Continue home Eliquis  Physical deconditioning-uses rolling walker at baseline. -PT/OT  Leukocytosis/bandemia-likely from steroid.  Goal of care counseling-patient with comorbidity as above.  Very frail and deconditioned.  Poor long-term prognosis.  Appropriately DNR/DNI.  A candidate for hospice -Palliative medicine consulted.  Body mass index is 22.77 kg/m.         DVT prophylaxis:   apixaban (ELIQUIS) tablet 5 mg  Code Status: DNR/DNI Family Communication: Patient and/or RN. Available if any question.  Level of care: Progressive Cardiac Status is: Inpatient  Remains inpatient appropriate because: Due to significant oxygen requirement, work of breathing and need for IV medications and close monitoring       Consultants:  Cardiology   Sch Meds:  Scheduled Meds:  apixaban  5 mg Oral BID   atorvastatin  40 mg Oral Daily   budesonide (PULMICORT) nebulizer solution  0.5 mg Nebulization BID   Chlorhexidine Gluconate Cloth  6 each Topical Daily   feeding supplement  237 mL Oral TID BM   FLUoxetine  40 mg Oral  Daily   guaiFENesin  1,200 mg Oral BID   ipratropium-albuterol  3 mL Nebulization Q4H   methylPREDNISolone (SOLU-MEDROL) injection  40 mg Intravenous Q12H   OLANZapine  10 mg Oral QHS   Continuous Infusions:  azithromycin     ceFEPime (MAXIPIME) IV 2 g (05/11/21 0809)   PRN Meds:.acetaminophen, ALPRAZolam, docusate sodium, guaiFENesin-dextromethorphan, ondansetron (ZOFRAN) IV, ondansetron, oxyCODONE, polyethylene glycol, sodium chloride  Antimicrobials: Anti-infectives (From admission, onward)    Start     Dose/Rate Route Frequency Ordered Stop   05/11/21 1630  azithromycin (ZITHROMAX) 500 mg in sodium chloride 0.9 % 250 mL IVPB        500 mg 250 mL/hr over 60 Minutes Intravenous Every 24 hours 05/11/21 1537 05/14/21 1629   05/08/21 1800  vancomycin (VANCOREADY) IVPB 750 mg/150 mL  Status:  Discontinued        750 mg 150 mL/hr over 60 Minutes Intravenous Every 24 hours 05/08/21 1135 05/09/21 1448   05/08/21 0800  ceFEPIme (MAXIPIME) 2 g in sodium chloride 0.9 % 100 mL IVPB  2 g 200 mL/hr over 30 Minutes Intravenous Every 12 hours 05/07/21 1929     05/07/21 2200  ceFEPIme (MAXIPIME) 2 g in sodium chloride 0.9 % 100 mL IVPB  Status:  Discontinued        2 g 200 mL/hr over 30 Minutes Intravenous Every 12 hours 05/07/21 1927 05/07/21 1929   05/07/21 1930  vancomycin (VANCOREADY) IVPB 500 mg/100 mL        500 mg 100 mL/hr over 60 Minutes Intravenous  Once 05/07/21 1927 05/07/21 2144   05/07/21 1927  vancomycin variable dose per unstable renal function (pharmacist dosing)  Status:  Discontinued         Does not apply See admin instructions 05/07/21 1927 05/09/21 1448   05/07/21 1600  vancomycin (VANCOCIN) IVPB 1000 mg/200 mL premix        1,000 mg 200 mL/hr over 60 Minutes Intravenous  Once 05/07/21 1552 05/07/21 1801   05/07/21 1600  ceFEPIme (MAXIPIME) 2 g in sodium chloride 0.9 % 100 mL IVPB        2 g 200 mL/hr over 30 Minutes Intravenous  Once 05/07/21 1552 05/07/21 1700         I have personally reviewed the following labs and images: CBC: Recent Labs  Lab 05/07/21 1446 05/08/21 0552 05/09/21 0701 05/10/21 0517 05/11/21 0555  WBC 16.6* 14.1* 9.5 14.0* 14.5*  NEUTROABS 14.2*  --   --   --  14.0*  HGB 9.9* 9.4* 8.1* 7.6* 9.3*  HCT 30.2* 28.5* 25.4* 24.1* 28.6*  MCV 94.1 94.4 94.4 95.6 95.0  PLT 275 263 238 228 227   BMP &GFR Recent Labs  Lab 05/07/21 1446 05/08/21 0552 05/09/21 0701 05/10/21 0517 05/11/21 0555  NA 139 141 138 140 142  K 4.1 3.9 4.6 4.5 4.3  CL 101 103 103 103 105  CO2 27 26 26 26 28   GLUCOSE 120* 101* 129* 156* 131*  BUN 27* 22 30* 36* 42*  CREATININE 1.71* 1.50* 1.44* 1.74* 1.39*  CALCIUM 8.7* 8.8* 8.4* 8.9 8.9  MG  --  1.8 2.1 2.0 2.0  PHOS  --   --   --  5.0* 4.8*   Estimated Creatinine Clearance: 44.6 mL/min (A) (by C-G formula based on SCr of 1.39 mg/dL (H)). Liver & Pancreas: Recent Labs  Lab 05/07/21 1446 05/11/21 0555  AST 22 20  ALT 15 19  ALKPHOS 80 74  BILITOT 0.9 0.8  PROT 6.5 5.5*  ALBUMIN 2.6* 2.3*   No results for input(s): LIPASE, AMYLASE in the last 168 hours. No results for input(s): AMMONIA in the last 168 hours. Diabetic: No results for input(s): HGBA1C in the last 72 hours. No results for input(s): GLUCAP in the last 168 hours. Cardiac Enzymes: No results for input(s): CKTOTAL, CKMB, CKMBINDEX, TROPONINI in the last 168 hours. No results for input(s): PROBNP in the last 8760 hours. Coagulation Profile: Recent Labs  Lab 05/07/21 1446  INR 1.7*   Thyroid Function Tests: No results for input(s): TSH, T4TOTAL, FREET4, T3FREE, THYROIDAB in the last 72 hours. Lipid Profile: Recent Labs    05/10/21 0500  CHOL 121  HDL 45  LDLCALC 61  TRIG 73  CHOLHDL 2.7   Anemia Panel: No results for input(s): VITAMINB12, FOLATE, FERRITIN, TIBC, IRON, RETICCTPCT in the last 72 hours. Urine analysis:    Component Value Date/Time   COLORURINE YELLOW (A) 05/07/2021 2044   APPEARANCEUR  CLEAR (A) 05/07/2021 2044   LABSPEC 1.021 05/07/2021 2044   PHURINE  6.0 05/07/2021 2044   GLUCOSEU NEGATIVE 05/07/2021 2044   HGBUR NEGATIVE 05/07/2021 2044   BILIRUBINUR NEGATIVE 05/07/2021 2044   KETONESUR NEGATIVE 05/07/2021 2044   PROTEINUR NEGATIVE 05/07/2021 2044   NITRITE NEGATIVE 05/07/2021 2044   LEUKOCYTESUR NEGATIVE 05/07/2021 2044   Sepsis Labs: Invalid input(s): PROCALCITONIN, [place]  Microbiology: Recent Results (from the past 240 hour(s))  Culture, blood (Routine x 2)     Status: None (Preliminary result)   Collection Time: 05/07/21  2:46 PM   Specimen: BLOOD  Result Value Ref Range Status   Specimen Description BLOOD PORTA CATH  Final   Special Requests   Final    BOTTLES DRAWN AEROBIC AND ANAEROBIC Blood Culture results may not be optimal due to an excessive volume of blood received in culture bottles   Culture   Final    NO GROWTH 3 DAYS Performed at [facility], [address], [place], [place] [address]    Report Status PENDING  Incomplete  Culture, blood (Routine x 2)     Status: None (Preliminary result)   Collection Time: 05/07/21  2:46 PM   Specimen: BLOOD  Result Value Ref Range Status   Specimen Description BLOOD RIGHT ANTECUBITAL  Final   Special Requests   Final    BOTTLES DRAWN AEROBIC AND ANAEROBIC Blood Culture results may not be optimal due to an excessive volume of blood received in culture bottles   Culture   Final    NO GROWTH 3 DAYS Performed at [facility], [address] [place], [place] [address]    Report Status PENDING  Incomplete  Resp Panel by RT-PCR (Flu A&B, Covid) Nasopharyngeal Swab     Status: None   Collection Time: 05/07/21  5:57 PM   Specimen: Nasopharyngeal Swab; Nasopharyngeal(NP) swabs in vial transport medium  Result Value Ref Range Status   SARS Coronavirus 2 by RT PCR NEGATIVE NEGATIVE Final    Comment: (NOTE) SARS-CoV-2 target nucleic acids are NOT DETECTED.  The SARS-CoV-2 RNA is  generally detectable in upper respiratory specimens during the acute phase of infection. The lowest concentration of SARS-CoV-2 viral copies this assay can detect is 138 copies/mL. A negative result does not preclude SARS-Cov-2 infection and should not be used as the sole basis for treatment or other patient management decisions. A negative result may occur with  improper specimen collection/handling, submission of specimen other than nasopharyngeal swab, presence of viral mutation(s) within the areas targeted by this assay, and inadequate number of viral copies(<138 copies/mL). A negative result must be combined with clinical observations, patient history, and epidemiological information. The expected result is Negative.  Fact Sheet for Patients:  EntrepreneurPulse.com.au  Fact Sheet for Healthcare Providers:  IncredibleEmployment.be  This test is no t yet approved or cleared by the [place] FDA and  has been authorized for detection and/or diagnosis of SARS-CoV-2 by FDA under an Emergency Use Authorization (EUA). This EUA will remain  in effect (meaning this test can be used) for the duration of the COVID-19 declaration under Section 564(b)(1) of the Act, 21 U.S.C.section 360bbb-3(b)(1), unless the authorization is terminated  or revoked sooner.       Influenza A by PCR NEGATIVE NEGATIVE Final   Influenza B by PCR NEGATIVE NEGATIVE Final    Comment: (NOTE) The Xpert Xpress SARS-CoV-2/FLU/RSV plus assay is intended as an aid in the diagnosis of influenza from Nasopharyngeal swab specimens and should not be used as a sole basis for treatment. Nasal washings and aspirates are  unacceptable for Xpert Xpress SARS-CoV-2/FLU/RSV testing.  Fact Sheet for Patients: EntrepreneurPulse.com.au  Fact Sheet for Healthcare Providers: IncredibleEmployment.be  This test is not yet approved or cleared by the [place] FDA and has been authorized for detection and/or diagnosis of SARS-CoV-2 by FDA under an Emergency Use Authorization (EUA). This EUA will remain in effect (meaning this test can be used) for the duration of the COVID-19 declaration under Section 564(b)(1) of the Act, 21 U.S.C. section 360bbb-3(b)(1), unless the authorization is terminated or revoked.  Performed at [facility], [address] [place], [place] [address]   MRSA Next Gen by PCR, Nasal     Status: None   Collection Time: 05/09/21  1:16 PM   Specimen: Nasal Mucosa; Nasal Swab  Result Value Ref Range Status   MRSA by PCR Next Gen NOT DETECTED NOT DETECTED Final    Comment: (NOTE) The GeneXpert MRSA Assay (FDA approved for NASAL specimens only), is one component of a comprehensive MRSA colonization surveillance program. It is not intended to diagnose MRSA infection nor to guide or monitor treatment for MRSA infections. Test performance is not FDA approved in patients less than 66 years old. Performed at [facility], [address] [place], [place] [address]     Radiology Studies: DG Chest [place] 1 View  Result Date: 05/11/2021 CLINICAL DATA:  Shortness of breath in a 70 year old male with history of pulmonary neoplasm. EXAM: PORTABLE CHEST 1 VIEW COMPARISON:  Most recent chest x-ray from May 07, 2021 and CT of the chest from May 07, 2021. FINDINGS: Diffuse interstitial and airspace opacities in the mid and lower lungs as on the recent chest x-ray. RIGHT-sided Port-A-Cath terminates in the RIGHT atrium. Cardiomediastinal contours are stable. No visible pneumothorax.  No gross effusion. On limited assessment there is no acute skeletal process. IMPRESSION: Diffuse interstitial and airspace opacities in the mid and lower lungs, similar to the recent chest x-ray. May be post infectious fibrotic changes or ongoing pneumonia. Would also correlate with any risk factors for pneumonitis that could be  related to ongoing therapy. Electronically Signed   By: Zetta Bills M.D.   On: 05/11/2021 08:14       Lacresha Fusilier T. Riggins  If 7PM-7AM, please contact night-coverage [email or web address] 05/11/2021, 3:51 PM

## 2021-05-11 NOTE — Evaluation (Signed)
Physical Therapy Evaluation Patient Details Name: Jimmy West MRN: [id number] DOB: January 07, 1951 Today's Date: 05/11/2021  History of Present Illness  70 y.o. male was brought to hosp to admit 10/18 for hypoxia, weakness, SOB and noted sepsis from PNA, as well as NSTEMI.  Troponins are high from demand ischemia, EF 40-45%, and has recent asp PNA per chart.  PMHx:  R lower lobe stage IV adenocarcinoma of the lung, chronic anemia, anxiety, depression, presence of Port-A-Cath, cad s/p PCI with stent placement in 2005, tobacco abuse quit in 2005  Clinical Impression  Pt was seen for progression from bed to side of bed, during which time his O2 sat dropped from 89% to 91% to 81%.  Pt had increased flow of O2 but no improvement was achieved.  Returned to bed and recovered on the elevated O2 at 10L to 94%.  Dropped O2 to baseline, talked with him about his tolerance to move and goals for PT.   Pt has good motivation but is fragile in appearance, with low strength on LE's and low endurance to sit comfortably.  Follow up on acute PT goals, to restore enough independent gait for home.  Recommending SNF due to this need, and pt is in agreement for now.  Will try to gain approval for SNF care from SW and case management.       Recommendations for follow up therapy are one component of a multi-disciplinary discharge planning process, led by the attending physician.  Recommendations may be updated based on patient status, additional functional criteria and insurance authorization.  Follow Up Recommendations SNF    Equipment Recommendations  None recommended by PT    Recommendations for Other Services       Precautions / Restrictions Precautions Precautions: Fall Precaution Comments: monitor sats and HR Restrictions Weight Bearing Restrictions: No Other Position/Activity Restrictions: upright best for his hypoxia      Mobility  Bed Mobility Overal bed mobility: Needs Assistance Bed  Mobility: Supine to Sit;Sit to Supine     Supine to sit: Min assist Sit to supine: Min assist   General bed mobility comments: assisted with bed linens and pt used elevated HOB and rail    Transfers                 General transfer comment: deferred over the O2 sats from mobility even on O2  Ambulation/Gait             General Gait Details: deferred  Stairs            Wheelchair Mobility    Modified Rankin (Stroke Patients Only)       Balance Overall balance assessment: Needs assistance Sitting-balance support: Feet supported Sitting balance-Leahy Scale: Fair Sitting balance - Comments: O2 sats drop in sitting                                     Pertinent Vitals/Pain Pain Assessment: No/denies pain    Home Living Family/patient expects to be discharged to:: Private residence Living Arrangements: Non-relatives/Friends Available Help at Discharge: Family;Friend(s);Available PRN/intermittently Type of Home: Mobile home Home Access: Stairs to enter Entrance Stairs-Rails: Right;Left;Can reach both Entrance Stairs-Number of Steps: 5 Home Layout: One level Home Equipment: Cane - quad Additional Comments: fell before quad cane was obtained but does not feel secure on it    Prior Function Level of Independence: Independent  Comments: unsteady on quad caen     Hand Dominance   Dominant Hand: Right    Extremity/Trunk Assessment   Upper Extremity Assessment Upper Extremity Assessment: Defer to OT evaluation    Lower Extremity Assessment Lower Extremity Assessment: Generalized weakness    Cervical / Trunk Assessment Cervical / Trunk Assessment: Normal  Communication   Communication: No difficulties  Cognition Arousal/Alertness: Awake/alert Behavior During Therapy: WFL for tasks assessed/performed Overall Cognitive Status: Within Functional Limits for tasks assessed                                         General Comments General comments (skin integrity, edema, etc.): pt was assisted to side of bed with pt doing the majority of the moving, but cannot maintain O2 sats unless he is in bed on higher volume O2    Exercises     Assessment/Plan    PT Assessment Patient needs continued PT services  PT Problem List Decreased strength;Decreased range of motion;Decreased activity tolerance;Decreased balance;Decreased mobility;Cardiopulmonary status limiting activity       PT Treatment Interventions DME instruction;Gait training;Stair training;Functional mobility training;Therapeutic activities;Therapeutic exercise;Balance training;Neuromuscular re-education;Patient/family education    PT Goals (Current goals can be found in the Care Plan section)  Acute Rehab PT Goals Patient Stated Goal: to go home PT Goal Formulation: With patient Time For Goal Achievement: 05/25/21 Potential to Achieve Goals: Good    Frequency Min 2X/week   Barriers to discharge Decreased caregiver support;Inaccessible home environment home with steps and no assistance    Co-evaluation               AM-PAC PT "6 Clicks" Mobility  Outcome Measure Help needed turning from your back to your side while in a flat bed without using bedrails?: None Help needed moving from lying on your back to sitting on the side of a flat bed without using bedrails?: None Help needed moving to and from a bed to a chair (including a wheelchair)?: A Little Help needed standing up from a chair using your arms (e.g., wheelchair or bedside chair)?: A Little Help needed to walk in hospital room?: A Little Help needed climbing 3-5 steps with a railing? : Total 6 Click Score: 18    End of Session Equipment Utilized During Treatment: Gait belt;Oxygen Activity Tolerance: Patient tolerated treatment well Patient left: in bed;with call bell/phone within reach;with bed alarm set Nurse Communication: Mobility status PT Visit  Diagnosis: Muscle weakness (generalized) (M62.81);Difficulty in walking, not elsewhere classified (R26.2);Other (comment) (pulm CA with hypoxia)    Time: 8657-8469 PT Time Calculation (min) (ACUTE ONLY): 32 min   Charges:   PT Evaluation $PT Eval Moderate Complexity: 1 Mod PT Treatments $Therapeutic Activity: 8-22 mins       Ramond Dial 05/11/2021, 3:24 PM  Mee Hives, PT PhD Acute Rehab Dept. Number: [place] [facility] [place]

## 2021-05-12 DIAGNOSIS — J9601 Acute respiratory failure with hypoxia: Secondary | ICD-10-CM | POA: Diagnosis not present

## 2021-05-12 DIAGNOSIS — C3431 Malignant neoplasm of lower lobe, right bronchus or lung: Secondary | ICD-10-CM

## 2021-05-12 DIAGNOSIS — J189 Pneumonia, unspecified organism: Secondary | ICD-10-CM | POA: Diagnosis not present

## 2021-05-12 DIAGNOSIS — Z7189 Other specified counseling: Secondary | ICD-10-CM | POA: Diagnosis not present

## 2021-05-12 DIAGNOSIS — R0902 Hypoxemia: Secondary | ICD-10-CM | POA: Diagnosis not present

## 2021-05-12 DIAGNOSIS — I214 Non-ST elevation (NSTEMI) myocardial infarction: Secondary | ICD-10-CM | POA: Diagnosis not present

## 2021-05-12 DIAGNOSIS — A419 Sepsis, unspecified organism: Secondary | ICD-10-CM

## 2021-05-12 DIAGNOSIS — E43 Unspecified severe protein-calorie malnutrition: Secondary | ICD-10-CM

## 2021-05-12 DIAGNOSIS — J690 Pneumonitis due to inhalation of food and vomit: Secondary | ICD-10-CM

## 2021-05-12 LAB — IRON AND TIBC
Iron: 59 ug/dL (ref 45–182)
Saturation Ratios: 21 % (ref 17.9–39.5)
TIBC: 276 ug/dL (ref 250–450)
UIBC: 217 ug/dL

## 2021-05-12 LAB — CBC
HCT: 30 % — ABNORMAL LOW (ref 39.0–52.0)
Hemoglobin: 9.8 g/dL — ABNORMAL LOW (ref 13.0–17.0)
MCH: 31.5 pg (ref 26.0–34.0)
MCHC: 32.7 g/dL (ref 30.0–36.0)
MCV: 96.5 fL (ref 80.0–100.0)
Platelets: 222 10*3/uL (ref 150–400)
RBC: 3.11 MIL/uL — ABNORMAL LOW (ref 4.22–5.81)
RDW: 19.8 % — ABNORMAL HIGH (ref 11.5–15.5)
WBC: 15.3 10*3/uL — ABNORMAL HIGH (ref 4.0–10.5)
nRBC: 0 % (ref 0.0–0.2)

## 2021-05-12 LAB — RENAL FUNCTION PANEL
Albumin: 2.4 g/dL — ABNORMAL LOW (ref 3.5–5.0)
Anion gap: 8 (ref 5–15)
BUN: 48 mg/dL — ABNORMAL HIGH (ref 8–23)
CO2: 29 mmol/L (ref 22–32)
Calcium: 9 mg/dL (ref 8.9–10.3)
Chloride: 105 mmol/L (ref 98–111)
Creatinine, Ser: 1.32 mg/dL — ABNORMAL HIGH (ref 0.61–1.24)
GFR, Estimated: 58 mL/min — ABNORMAL LOW (ref >60)
Glucose, Bld: 146 mg/dL — ABNORMAL HIGH (ref 70–99)
Phosphorus: 2.6 mg/dL (ref 2.5–4.6)
Potassium: 4.6 mmol/L (ref 3.5–5.1)
Sodium: 142 mmol/L (ref 135–145)

## 2021-05-12 LAB — CULTURE, BLOOD (ROUTINE X 2)
Culture: NO GROWTH
Culture: NO GROWTH

## 2021-05-12 LAB — MAGNESIUM: Magnesium: 1.9 mg/dL (ref 1.7–2.4)

## 2021-05-12 LAB — RETICULOCYTES
Immature Retic Fract: 13.9 % (ref 2.3–15.9)
RBC.: 3.22 MIL/uL — ABNORMAL LOW (ref 4.22–5.81)
Retic Count, Absolute: 86.9 10*3/uL (ref 19.0–186.0)
Retic Ct Pct: 2.7 % (ref 0.4–3.1)

## 2021-05-12 LAB — FERRITIN: Ferritin: 1120 ng/mL — ABNORMAL HIGH (ref 24–336)

## 2021-05-12 LAB — GLUCOSE, CAPILLARY
Glucose-Capillary: 170 mg/dL — ABNORMAL HIGH (ref 70–99)
Glucose-Capillary: 177 mg/dL — ABNORMAL HIGH (ref 70–99)
Glucose-Capillary: 132 mg/dL — ABNORMAL HIGH (ref 70–99)

## 2021-05-12 LAB — FOLATE: Folate: 8.1 ng/mL (ref >5.9)

## 2021-05-12 LAB — VITAMIN B12: Vitamin B-12: 872 pg/mL (ref 180–914)

## 2021-05-12 NOTE — Progress Notes (Signed)
Progress Note  Patient Name: Jimmy West Date of Encounter: 05/12/2021  [facility] Cardiologist: Kate Sable, MD   Subjective   In bed on rounds, has not spent much time out of bed Feels weak, Feels he is eating enough Continued cough with secretions, on 5 L  Inpatient Medications    Scheduled Meds:  apixaban  5 mg Oral BID   atorvastatin  40 mg Oral Daily   budesonide (PULMICORT) nebulizer solution  0.5 mg Nebulization BID   Chlorhexidine Gluconate Cloth  6 each Topical Daily   feeding supplement  237 mL Oral TID BM   FLUoxetine  40 mg Oral Daily   guaiFENesin  1,200 mg Oral BID   ipratropium-albuterol  3 mL Nebulization Q4H   methylPREDNISolone (SOLU-MEDROL) injection  40 mg Intravenous Q12H   OLANZapine  10 mg Oral QHS   Continuous Infusions:  azithromycin 500 mg (05/11/21 1806)   ceFEPime (MAXIPIME) IV 2 g (05/12/21 0753)   PRN Meds: acetaminophen, ALPRAZolam, docusate sodium, guaiFENesin-dextromethorphan, ondansetron (ZOFRAN) IV, ondansetron, oxyCODONE, polyethylene glycol, sodium chloride   Vital Signs    Vitals:   05/12/21 0749 05/12/21 0751 05/12/21 1116 05/12/21 1155  BP: (!) 153/97   102/66  Pulse: 99   98  Resp: (!) 24   17  Temp: (!) 97.5 F (36.4 C)   97.9 F (36.6 C)  TempSrc: Oral     SpO2: 91% 90% 92% 94%  Weight:      Height:        Intake/Output Summary (Last 24 hours) at 05/12/2021 1500 Last data filed at 05/12/2021 1013 Gross per 24 hour  Intake 480 ml  Output 1050 ml  Net -570 ml   Last 3 Weights 05/10/2021 05/10/2021 05/07/2021  Weight (lbs) 141 lb 1.5 oz 139 lb 5.3 oz 128 lb 12 oz  Weight (kg) 64 kg 63.2 kg 58.4 kg      Telemetry    Normal sinus rhythm- Personally Reviewed  ECG     - Personally Reviewed  Physical Exam   GEN: Very thin, no acute distress.   Neck: No JVD Cardiac: RRR, no murmurs, rubs, or gallops.  Respiratory: Coarse breath sounds bilaterally GI: Soft, nontender, non-distended   MS: No edema; No deformity. Neuro:  Nonfocal  Psych: Normal affect   Labs    High Sensitivity Troponin:   Recent Labs  Lab 04/24/21 2120 05/07/21 1446 05/07/21 1714 05/08/21 0320 05/08/21 0552  TROPONINIHS 179* 3,153* 3,506* 2,927* 2,894*     Chemistry Recent Labs  Lab 05/07/21 1446 05/08/21 0552 05/10/21 0517 05/11/21 0555 05/12/21 0530  NA 139   < > 140 142 142  K 4.1   < > 4.5 4.3 4.6  CL 101   < > 103 105 105  CO2 27   < > 26 28 29   GLUCOSE 120*   < > 156* 131* 146*  BUN 27*   < > 36* 42* 48*  CREATININE 1.71*   < > 1.74* 1.39* 1.32*  CALCIUM 8.7*   < > 8.9 8.9 9.0  MG  --    < > 2.0 2.0 1.9  PROT 6.5  --   --  5.5*  --   ALBUMIN 2.6*  --   --  2.3* 2.4*  AST 22  --   --  20  --   ALT 15  --   --  19  --   ALKPHOS 80  --   --  74  --  BILITOT 0.9  --   --  0.8  --   GFRNONAA 43*   < > 42* 55* 58*  ANIONGAP 11   < > 11 9 8    < > = values in this interval not displayed.    Lipids  Recent Labs  Lab 05/10/21 0500  CHOL 121  TRIG 73  HDL 45  LDLCALC 61  CHOLHDL 2.7    Hematology Recent Labs  Lab 05/10/21 0517 05/11/21 0555 05/12/21 0530  WBC 14.0* 14.5* 15.3*  RBC 2.52* 3.01* 3.11*  3.22*  HGB 7.6* 9.3* 9.8*  HCT 24.1* 28.6* 30.0*  MCV 95.6 95.0 96.5  MCH 30.2 30.9 31.5  MCHC 31.5 32.5 32.7  RDW 20.2* 19.6* 19.8*  PLT 228 227 222   Thyroid No results for input(s): TSH, FREET4 in the last 168 hours.  BNP Recent Labs  Lab 05/07/21 1446  BNP 1,105.5*    DDimer  Recent Labs  Lab 05/07/21 1446  DDIMER 1.70*     Radiology    DG Chest Port 1 View  Result Date: 05/11/2021 CLINICAL DATA:  Shortness of breath in a 70 year old male with history of pulmonary neoplasm. EXAM: PORTABLE CHEST 1 VIEW COMPARISON:  Most recent chest x-ray from May 07, 2021 and CT of the chest from May 07, 2021. FINDINGS: Diffuse interstitial and airspace opacities in the mid and lower lungs as on the recent chest x-ray. RIGHT-sided Port-A-Cath terminates  in the RIGHT atrium. Cardiomediastinal contours are stable. No visible pneumothorax.  No gross effusion. On limited assessment there is no acute skeletal process. IMPRESSION: Diffuse interstitial and airspace opacities in the mid and lower lungs, similar to the recent chest x-ray. May be post infectious fibrotic changes or ongoing pneumonia. Would also correlate with any risk factors for pneumonitis that could be related to ongoing therapy. Electronically Signed   By: Zetta Bills M.D.   On: 05/11/2021 08:14    Cardiac Studies     Patient Profile     70 year old male with a history of metastatic lung cancer on chemotherapy, prior PE on chronic Eliquis, anemia, stage III chronic kidney disease, and CAD status post PCI in 2005, who was admitted on October 18 with ongoing dyspnea in the setting of left lower lobe pneumonia and non-STEMI (peak high-sensitivity troponin 3506)  Assessment & Plan    Non-STEMI Known coronary artery disease, prior PCI 2005 Peak troponin 3500 Completed heparin 48 hours Echocardiogram mildly reduced ejection fraction 40 to 45%, mid to apical anteroseptal akinesis per cardiology note yesterday -Not a good candidate at this time for cardiac catheterization given anemia, stage IV lung cancer, pneumonia, renal dysfunction, frail nature --Currently asymptomatic with no significant angina -Very slow recovery from respiratory distress as detailed below No plans for ischemic work-up at this time, can discuss as an outpatient after he has had recovery   Cardiomyopathy, Ejection fraction 40 to 45% with focal wall motion abnormalities  Initially treated with Lasix, now on hold, appears euvolemic Blood pressure low limiting additional medications -Elevated heart rate secondary to underlying lung disease   History of PE On Eliquis twice daily   Left lower lobe pneumonia/acute respiratory distress Recent hospitalization for pneumonia on the right, home only for 1 day on  oxygen  readmitted with pneumonia on the left  Incentive spirometer, flutter valve, nebulizers, antibiotics, steroids   Severe protein calorie malnutrition Discussed need to increase his calorie intake Discussed with him in detail   Total encounter time more than 25 minutes  Greater than 50% was spent in counseling and coordination of care with the patient  [facility] will sign off.   Medication Recommendations: No further medication changes Other recommendations (labs, testing, etc): No further testing Follow up as an outpatient: We will arrange outpatient follow-up in clinic  For questions or updates, please contact [place] Please consult [email or web address] for contact info under        Signed, Ida Rogue, MD  05/12/2021, 3:00 PM

## 2021-05-12 NOTE — Progress Notes (Signed)
PROGRESS NOTE  Jimmy West YKD:[id number] DOB: [birth year]   PCP: Pcp, No  Patient is from: Home.  Uses rolling walker at baseline.  DOA: 05/07/2021 LOS: 5  Chief complaints:  Chief Complaint  Patient presents with   Shortness of Breath     Brief Narrative / Interim history: 70 year old M with PMH of stage IV RLL lung cancer, cancer pain, CAD/stent in 2005, chronic respiratory failure with hypoxia on 2 L, anemia of chronic disease, anxiety, depression and debility presenting with generalized weakness, shortness of breath and dry cough, and admitted for acute on chronic hypoxic respiratory failure in the setting of LLL pneumonia and non-STEMI.  Started on broad-spectrum antibiotics, steroid and IV heparin.  Cardiology consulted recommended medical management and [place] outpatient.  CTA chest negative for PE.  Continues to require up to 6 L by [place] at rest.  Subjective: Seen and examined earlier this morning.  No major events overnight of this morning.  Reports improvement in his breathing but continues to require 6 L by [place] at rest.  Cough is loose but not able to cough it up yet.  He denies chest pain, GI or UTI symptoms.  Objective: Vitals:   05/12/21 0749 05/12/21 0751 05/12/21 1116 05/12/21 1155  BP: (!) 153/97   102/66  Pulse: 99   98  Resp: (!) 24   17  Temp: (!) 97.5 F (36.4 C)   97.9 F (36.6 C)  TempSrc: Oral     SpO2: 91% 90% 92% 94%  Weight:      Height:        Intake/Output Summary (Last 24 hours) at 05/12/2021 1501 Last data filed at 05/12/2021 1013 Gross per 24 hour  Intake 480 ml  Output 1050 ml  Net -570 ml   Filed Weights   05/07/21 1439 05/10/21 0901 05/10/21 1537  Weight: 58.4 kg 63.2 kg 64 kg    Examination:  GENERAL: Frail looking elderly male.  No apparent distress. HEENT: MMM.  Vision and hearing grossly intact.  NECK: Supple.  No apparent JVD.  RESP: 90% on 6 L.  Hardly speaks in full sentence.  Crackles over LLL.  Diminished  aeration.  Poor inspiratory effort on incentive spirometry. CVS:  RRR. Heart sounds normal.  ABD/GI/GU: BS+. Abd soft, NTND.  MSK/EXT:  Moves extremities. No apparent deformity. No edema.  SKIN: no apparent skin lesion or wound NEURO: Awake and alert. Oriented appropriately.  No apparent focal neuro deficit. PSYCH: Calm. Normal affect.   Procedures:  None  Microbiology summarized: [id number] and influenza PCR nonreactive. MRSA PCR screen negative. Blood cultures NGTD.  Assessment & Plan: Acute on chronic respiratory failure with hypoxia due to LLL pneumonia with superimposed stage IV RLL lung cancer-on 2 L at baseline.  Currently requiring 6 L.  CTA chest negative for PE but concern for LLL pneumonia with RLL scarring from known lung cancer.  Pro-Cal elevated to 1.38.  Blood cultures negative. -Vancomycin discontinued.  MRSA PCR screen negative. -Continue IV cefepime, IV Solu-Medrol and nebulizers.   -Added azithromycin for atypical coverage on 10/22 -Wean oxygen as able. -IS/OOB/PT/OT  Severe sepsis due to pneumonia:tachycardia, tachypnea, leukocytosis, AKI with pneumonia POA -Management as above.    NSTEMI/history of CAD/stent in 2005: Troponin 3153 -> 3506 -> 2927 -> 2894.  EKG with T wave changes in lateral leads but no dynamic changes.  TTE with LVEF of 55 to 60%, G1-DD and no RWMA chest pain-free.  -Cardiology following -Completed 48 hours of IV heparin and  transition back to home Eliquis. -Medical management due to poor candidacy for heart cath in the setting of respiratory failure -Cardiology recommends outpatient follow-up when stable from respiratory standpoint.    Chronic diastolic CHF: TTE as above.  Appears euvolemic on exam.  Does not seem to be on diuretics at home.  About 1.6 L UOP/24 hours. -Monitor fluid status   AKI/azotemia on CKD-3A: Improved.  Does not seem to be on nephrotoxic meds.  Could be hemodynamically mediated Recent Labs    04/25/21 0513  04/26/21 0610 04/27/21 0452 04/28/21 0541 05/07/21 1446 05/08/21 0552 05/09/21 0701 05/10/21 0517 05/11/21 0555 05/12/21 0530  BUN 19 24* 32* 31* 27* 22 30* 36* 42* 48*  CREATININE 1.11 1.24 1.17 1.30* 1.71* 1.50* 1.44* 1.74* 1.39* 1.32*  -Continue monitoring -Avoid nephrotoxic meds.  Hypotension: Resolved.   Stage IV lung cancer s/p radiation and chemotherapy previously.  Not on treatment anymore likely due to poor clinical and functional status -Outpatient follow-up   Anemia of chronic disease: H&H stable after 1 unit Recent Labs    04/25/21 0513 04/26/21 0610 04/27/21 0452 04/28/21 0541 05/07/21 1446 05/08/21 0552 05/09/21 0701 05/10/21 0517 05/11/21 0555 05/12/21 0530  HGB 9.1* 8.8* 8.3* 9.7* 9.9* 9.4* 8.1* 7.6* 9.3* 9.8*  -Continue monitoring   Chronic pain syndrome: -Continue oxycodone and bowel regimen as needed.   Depression and Anxiety: Stable -Continue with Fluoxetine 40 g p.o. daily as well as Olanzapine 10 mg p.o. nightly   Hx of PE: CTA chest negative for PE. -Continue home Eliquis  Physical deconditioning-uses rolling walker at baseline. -Therapy recommends SNF  Leukocytosis/bandemia-likely from steroid.  Goal of care counseling-patient with comorbidity as above.  Very frail and deconditioned.  Poor long-term prognosis.  Appropriately DNR/DNI.  A candidate for hospice -Palliative medicine consulted.  Body mass index is 22.77 kg/m.         DVT prophylaxis:   apixaban (ELIQUIS) tablet 5 mg  Code Status: DNR/DNI Family Communication: Patient and/or RN. Available if any question.  Level of care: Progressive Cardiac Status is: Inpatient  Remains inpatient appropriate because: Due to significant oxygen requirement, work of breathing and need for IV medications and close monitoring       Consultants:  Cardiology Palliative medicine   Sch Meds:  Scheduled Meds:  apixaban  5 mg Oral BID   atorvastatin  40 mg Oral Daily    budesonide (PULMICORT) nebulizer solution  0.5 mg Nebulization BID   Chlorhexidine Gluconate Cloth  6 each Topical Daily   feeding supplement  237 mL Oral TID BM   FLUoxetine  40 mg Oral Daily   guaiFENesin  1,200 mg Oral BID   ipratropium-albuterol  3 mL Nebulization Q4H   methylPREDNISolone (SOLU-MEDROL) injection  40 mg Intravenous Q12H   OLANZapine  10 mg Oral QHS   Continuous Infusions:  azithromycin 500 mg (05/11/21 1806)   ceFEPime (MAXIPIME) IV 2 g (05/12/21 0753)   PRN Meds:.acetaminophen, ALPRAZolam, docusate sodium, guaiFENesin-dextromethorphan, ondansetron (ZOFRAN) IV, ondansetron, oxyCODONE, polyethylene glycol, sodium chloride  Antimicrobials: Anti-infectives (From admission, onward)    Start     Dose/Rate Route Frequency Ordered Stop   05/11/21 1630  azithromycin (ZITHROMAX) 500 mg in sodium chloride 0.9 % 250 mL IVPB        500 mg 250 mL/hr over 60 Minutes Intravenous Every 24 hours 05/11/21 1537 05/14/21 1629   05/08/21 1800  vancomycin (VANCOREADY) IVPB 750 mg/150 mL  Status:  Discontinued        750 mg 150  mL/hr over 60 Minutes Intravenous Every 24 hours 05/08/21 1135 05/09/21 1448   05/08/21 0800  ceFEPIme (MAXIPIME) 2 g in sodium chloride 0.9 % 100 mL IVPB        2 g 200 mL/hr over 30 Minutes Intravenous Every 12 hours 05/07/21 1929     05/07/21 2200  ceFEPIme (MAXIPIME) 2 g in sodium chloride 0.9 % 100 mL IVPB  Status:  Discontinued        2 g 200 mL/hr over 30 Minutes Intravenous Every 12 hours 05/07/21 1927 05/07/21 1929   05/07/21 1930  vancomycin (VANCOREADY) IVPB 500 mg/100 mL        500 mg 100 mL/hr over 60 Minutes Intravenous  Once 05/07/21 1927 05/07/21 2144   05/07/21 1927  vancomycin variable dose per unstable renal function (pharmacist dosing)  Status:  Discontinued         Does not apply See admin instructions 05/07/21 1927 05/09/21 1448   05/07/21 1600  vancomycin (VANCOCIN) IVPB 1000 mg/200 mL premix        1,000 mg 200 mL/hr over 60 Minutes  Intravenous  Once 05/07/21 1552 05/07/21 1801   05/07/21 1600  ceFEPIme (MAXIPIME) 2 g in sodium chloride 0.9 % 100 mL IVPB        2 g 200 mL/hr over 30 Minutes Intravenous  Once 05/07/21 1552 05/07/21 1700        I have personally reviewed the following labs and images: CBC: Recent Labs  Lab 05/07/21 1446 05/08/21 0552 05/09/21 0701 05/10/21 0517 05/11/21 0555 05/12/21 0530  WBC 16.6* 14.1* 9.5 14.0* 14.5* 15.3*  NEUTROABS 14.2*  --   --   --  14.0*  --   HGB 9.9* 9.4* 8.1* 7.6* 9.3* 9.8*  HCT 30.2* 28.5* 25.4* 24.1* 28.6* 30.0*  MCV 94.1 94.4 94.4 95.6 95.0 96.5  PLT 275 263 238 228 227 222   BMP &GFR Recent Labs  Lab 05/08/21 0552 05/09/21 0701 05/10/21 0517 05/11/21 0555 05/12/21 0530  NA 141 138 140 142 142  K 3.9 4.6 4.5 4.3 4.6  CL 103 103 103 105 105  CO2 26 26 26 28 29   GLUCOSE 101* 129* 156* 131* 146*  BUN 22 30* 36* 42* 48*  CREATININE 1.50* 1.44* 1.74* 1.39* 1.32*  CALCIUM 8.8* 8.4* 8.9 8.9 9.0  MG 1.8 2.1 2.0 2.0 1.9  PHOS  --   --  5.0* 4.8* 2.6   Estimated Creatinine Clearance: 47 mL/min (A) (by C-G formula based on SCr of 1.32 mg/dL (H)). Liver & Pancreas: Recent Labs  Lab 05/07/21 1446 05/11/21 0555 05/12/21 0530  AST 22 20  --   ALT 15 19  --   ALKPHOS 80 74  --   BILITOT 0.9 0.8  --   PROT 6.5 5.5*  --   ALBUMIN 2.6* 2.3* 2.4*   No results for input(s): LIPASE, AMYLASE in the last 168 hours. No results for input(s): AMMONIA in the last 168 hours. Diabetic: No results for input(s): HGBA1C in the last 72 hours. Recent Labs  Lab 05/12/21 1153  GLUCAP 170*   Cardiac Enzymes: No results for input(s): CKTOTAL, CKMB, CKMBINDEX, TROPONINI in the last 168 hours. No results for input(s): PROBNP in the last 8760 hours. Coagulation Profile: Recent Labs  Lab 05/07/21 1446  INR 1.7*   Thyroid Function Tests: No results for input(s): TSH, T4TOTAL, FREET4, T3FREE, THYROIDAB in the last 72 hours. Lipid Profile: Recent Labs     05/10/21 0500  CHOL 121  HDL 45  LDLCALC 61  TRIG 73  CHOLHDL 2.7   Anemia Panel: Recent Labs    05/12/21 0530  FOLATE 8.1  FERRITIN 1,120*  TIBC 276  IRON 59  RETICCTPCT 2.7   Urine analysis:    Component Value Date/Time   COLORURINE YELLOW (A) 05/07/2021 2044   APPEARANCEUR CLEAR (A) 05/07/2021 2044   LABSPEC 1.021 05/07/2021 2044   PHURINE 6.0 05/07/2021 2044   GLUCOSEU NEGATIVE 05/07/2021 2044   HGBUR NEGATIVE 05/07/2021 2044   BILIRUBINUR NEGATIVE 05/07/2021 2044   [place] NEGATIVE 05/07/2021 2044   PROTEINUR NEGATIVE 05/07/2021 2044   NITRITE NEGATIVE 05/07/2021 2044   LEUKOCYTESUR NEGATIVE 05/07/2021 2044   Sepsis Labs: Invalid input(s): PROCALCITONIN, [place]  Microbiology: Recent Results (from the past 240 hour(s))  Culture, blood (Routine x 2)     Status: None   Collection Time: 05/07/21  2:46 PM   Specimen: BLOOD  Result Value Ref Range Status   Specimen Description BLOOD PORTA CATH  Final   Special Requests   Final    BOTTLES DRAWN AEROBIC AND ANAEROBIC Blood Culture results may not be optimal due to an excessive volume of blood received in culture bottles   Culture   Final    NO GROWTH 5 DAYS Performed at [facility], [address] [place], [place] [address]    Report Status 05/12/2021 FINAL  Final  Culture, blood (Routine x 2)     Status: None   Collection Time: 05/07/21  2:46 PM   Specimen: BLOOD  Result Value Ref Range Status   Specimen Description BLOOD RIGHT ANTECUBITAL  Final   Special Requests   Final    BOTTLES DRAWN AEROBIC AND ANAEROBIC Blood Culture results may not be optimal due to an excessive volume of blood received in culture bottles   Culture   Final    NO GROWTH 5 DAYS Performed at [facility], [address] [place], [place] [address]    Report Status 05/12/2021 FINAL  Final  Resp Panel by RT-PCR (Flu A&B, Covid) Nasopharyngeal Swab     Status: None   Collection Time: 05/07/21  5:57 PM    Specimen: Nasopharyngeal Swab; Nasopharyngeal(NP) swabs in vial transport medium  Result Value Ref Range Status   SARS Coronavirus 2 by RT PCR NEGATIVE NEGATIVE Final    Comment: (NOTE) SARS-CoV-2 target nucleic acids are NOT DETECTED.  The SARS-CoV-2 RNA is generally detectable in upper respiratory specimens during the acute phase of infection. The lowest concentration of SARS-CoV-2 viral copies this assay can detect is 138 copies/mL. A negative result does not preclude SARS-Cov-2 infection and should not be used as the sole basis for treatment or other patient management decisions. A negative result may occur with  improper specimen collection/handling, submission of specimen other than nasopharyngeal swab, presence of viral mutation(s) within the areas targeted by this assay, and inadequate number of viral copies(<138 copies/mL). A negative result must be combined with clinical observations, patient history, and epidemiological information. The expected result is Negative.  Fact Sheet for Patients:  EntrepreneurPulse.com.au  Fact Sheet for Healthcare Providers:  IncredibleEmployment.be  This test is no t yet approved or cleared by the [place] FDA and  has been authorized for detection and/or diagnosis of SARS-CoV-2 by FDA under an Emergency Use Authorization (EUA). This EUA will remain  in effect (meaning this test can be used) for the duration of the COVID-19 declaration under Section 564(b)(1) of the Act, 21 U.S.C.section 360bbb-3(b)(1), unless the authorization is  terminated  or revoked sooner.       Influenza A by PCR NEGATIVE NEGATIVE Final   Influenza B by PCR NEGATIVE NEGATIVE Final    Comment: (NOTE) The Xpert Xpress SARS-CoV-2/FLU/RSV plus assay is intended as an aid in the diagnosis of influenza from Nasopharyngeal swab specimens and should not be used as a sole basis for treatment. Nasal washings and aspirates are  unacceptable for Xpert Xpress SARS-CoV-2/FLU/RSV testing.  Fact Sheet for Patients: EntrepreneurPulse.com.au  Fact Sheet for Healthcare Providers: IncredibleEmployment.be  This test is not yet approved or cleared by the [place] FDA and has been authorized for detection and/or diagnosis of SARS-CoV-2 by FDA under an Emergency Use Authorization (EUA). This EUA will remain in effect (meaning this test can be used) for the duration of the COVID-19 declaration under Section 564(b)(1) of the Act, 21 U.S.C. section 360bbb-3(b)(1), unless the authorization is terminated or revoked.  Performed at [facility], [address] [place], [place] [address]   MRSA Next Gen by PCR, Nasal     Status: None   Collection Time: 05/09/21  1:16 PM   Specimen: Nasal Mucosa; Nasal Swab  Result Value Ref Range Status   MRSA by PCR Next Gen NOT DETECTED NOT DETECTED Final    Comment: (NOTE) The GeneXpert MRSA Assay (FDA approved for NASAL specimens only), is one component of a comprehensive MRSA colonization surveillance program. It is not intended to diagnose MRSA infection nor to guide or monitor treatment for MRSA infections. Test performance is not FDA approved in patients less than 71 years old. Performed at [facility], [address], [place], [place] [address]     Radiology Studies: No results found.     Bricyn Labrada T. [place]  If 7PM-7AM, please contact night-coverage [email or web address] 05/12/2021, 3:01 PM

## 2021-05-13 ENCOUNTER — Telehealth: Payer: Self-pay | Admitting: Medical

## 2021-05-13 ENCOUNTER — Inpatient Hospital Stay: Payer: Medicare HMO

## 2021-05-13 DIAGNOSIS — I429 Cardiomyopathy, unspecified: Secondary | ICD-10-CM

## 2021-05-13 DIAGNOSIS — I214 Non-ST elevation (NSTEMI) myocardial infarction: Secondary | ICD-10-CM | POA: Diagnosis not present

## 2021-05-13 LAB — RENAL FUNCTION PANEL
Albumin: 2.5 g/dL — ABNORMAL LOW (ref 3.5–5.0)
Anion gap: 9 (ref 5–15)
BUN: 41 mg/dL — ABNORMAL HIGH (ref 8–23)
CO2: 29 mmol/L (ref 22–32)
Calcium: 9.1 mg/dL (ref 8.9–10.3)
Chloride: 103 mmol/L (ref 98–111)
Creatinine, Ser: 1.22 mg/dL (ref 0.61–1.24)
GFR, Estimated: >60 mL/min (ref >60)
Glucose, Bld: 120 mg/dL — ABNORMAL HIGH (ref 70–99)
Phosphorus: 2.4 mg/dL — ABNORMAL LOW (ref 2.5–4.6)
Potassium: 4.7 mmol/L (ref 3.5–5.1)
Sodium: 141 mmol/L (ref 135–145)

## 2021-05-13 LAB — BRAIN NATRIURETIC PEPTIDE: B Natriuretic Peptide: 863.1 pg/mL — ABNORMAL HIGH (ref 0.0–100.0)

## 2021-05-13 LAB — MAGNESIUM: Magnesium: 1.6 mg/dL — ABNORMAL LOW (ref 1.7–2.4)

## 2021-05-13 MED ORDER — SODIUM CHLORIDE 0.9 % IV SOLN
2.0000 g | Freq: Two times a day (BID) | INTRAVENOUS | Status: AC
  Filled 2021-05-13: qty 2

## 2021-05-13 MED ORDER — SODIUM CHLORIDE 0.9 % IV SOLN
INTRAVENOUS | Status: DC | PRN
  Administered 2021-05-13: 10 mL/h via INTRAVENOUS

## 2021-05-13 MED ORDER — MAGNESIUM SULFATE 2 GM/50ML IV SOLN
2.0000 g | Freq: Once | INTRAVENOUS | Status: AC
  Administered 2021-05-13: 2 g via INTRAVENOUS
  Filled 2021-05-13: qty 50

## 2021-05-13 MED ORDER — PREDNISONE 20 MG PO TABS
40.0000 mg | ORAL_TABLET | Freq: Every day | ORAL | Status: DC
  Administered 2021-05-14 – 2021-05-21 (×8): 40 mg via ORAL
  Filled 2021-05-13 (×8): qty 2

## 2021-05-13 NOTE — Progress Notes (Signed)
Occupational Therapy Treatment Patient Details Name: Jimmy West MRN: [id number] DOB: [birth year] Today's Date: 05/13/2021   History of present illness 70 y.o. male was brought to hosp to admit 10/18 for hypoxia, weakness, SOB and noted sepsis from PNA, as well as NSTEMI.  Troponins are high from demand ischemia, EF 40-45%, and has recent asp PNA per chart.  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 Upon arrival to room pt reclined in bed, agreeable to tx. Pt on 6L HFNC at rest, SpO2 83% - RN notified and in room, cleared to proceed with session as pt maintaining 83% at rest. Pt requires CGA + HHA for bed>chair SPT - desat 77% in standing and during prolonged conversation, resolves c rest. Pt unable to tolerate standing grooming tasks 2/2 poor activity tolerance, completes grooming seated c SETUP. Pt instructed in pulse ox use, d/c recs, falls prevention, ECS, home/routines modification, and HEP.Pt tolerated seated therex (marches, overhead press, IS) with extra rest breaks 2/2 poor activity tolerance. Pt making good progress toward goals however continues to be unsafe to return home alone. Pt continues to benefit from skilled OT services to maximize return to PLOF and minimize risk of future falls, injury, caregiver burden, and readmission. Will continue to follow POC. Discharge recommendation remains appropriate.     Recommendations for follow up therapy are one component of a multi-disciplinary discharge planning process, led by the attending physician.  Recommendations may be updated based on patient status, additional functional criteria and insurance authorization.    Follow Up Recommendations  Skilled nursing-short term rehab (<3 hours/day)    Assistance Recommended at  Discharge    Equipment Recommendations  Other (comment) (pulse ox)    Recommendations for Other Services      Precautions / Restrictions Precautions Precautions: Fall Precaution Comments: monitor sats and HR Restrictions Weight Bearing Restrictions: No       Mobility Bed Mobility Overal bed mobility: Needs Assistance Bed Mobility: Supine to Sit     Supine to sit: Min assist          Transfers Overall transfer level: Needs assistance Equipment used: 1 person hand held assist Transfers: Stand Pivot Transfers Sit to Stand: Min guard           General transfer comment: CGA + HHA for bed>chair, minor LOB noted able to self correct     Balance Overall balance assessment: Needs assistance Sitting-balance support: Feet supported Sitting balance-Leahy Scale: Fair     Standing balance support: Single extremity supported Standing balance-Leahy Scale: Poor                             ADL either performed or assessed with clinical judgement   ADL Overall ADL's : Needs assistance/impaired                                       General ADL Comments: CGA for bed>chair t/f. Pt unable to tolerate standing grooming tasks 2/2 poor activity tolerance, completes grooming seated c SETUP.      Cognition Arousal/Alertness: Awake/alert Behavior During Therapy: WFL for tasks assessed/performed Overall Cognitive Status: Within Functional Limits for tasks  assessed                                            Exercises Exercises: Other exercises Other Exercises Other Exercises: Pt educated re: DME recs, d/c recs, falls prevention, ECS, home/routines modification, AE recs Other Exercises: grooming, sup>sit, SPT, seated therex (marches, overhead press, IS).      General Comments SpO2 83% on 6L HFNC at rest, desat 77% in standing and during prolonged conversation.     Pertinent Vitals/ Pain       Pain Assessment: No/denies pain          Frequency  Min 2X/week        Progress Toward Goals  OT Goals(current goals can now be found in the care plan section)  Progress towards OT goals: Progressing toward goals  Acute Rehab OT Goals OT Goal Formulation: With patient Time For Goal Achievement: 05/25/21 Potential to Achieve Goals: Good ADL Goals Pt Will Perform Grooming: sitting;with modified independence Pt Will Perform Lower Body Dressing: sitting/lateral leans;with modified independence Pt Will Transfer to Toilet: with modified independence  Plan Discharge plan remains appropriate;Frequency needs to be updated       AM-PAC OT "6 Clicks" Daily Activity     Outcome Measure   Help from another person eating meals?: None Help from another person taking care of personal grooming?: A Little Help from another person toileting, which includes using toliet, bedpan, or urinal?: A Little Help from another person bathing (including washing, rinsing, drying)?: A Lot Help from another person to put on and taking off regular upper body clothing?: A Little Help from another person to put on and taking off regular lower body clothing?: A Lot 6 Click Score: 17    End of Session Equipment Utilized During Treatment: Oxygen  OT Visit Diagnosis: Other abnormalities of gait and mobility (R26.89);Muscle weakness (generalized) (M62.81)   Activity Tolerance Patient limited by fatigue;Patient tolerated treatment well   Patient Left in chair;with call bell/phone within reach;with chair alarm set;with nursing/sitter in room   Nurse Communication Mobility status        Time: 7341-9379 OT Time Calculation (min): 25 min  Charges: OT General Charges $OT Visit: 1 Visit OT Treatments $Self Care/Home Management : 8-22 mins $Therapeutic Exercise: 8-22 mins  Dessie Coma, M.S. OTR/L  05/13/21, 10:02 AM  ascom [phone]

## 2021-05-13 NOTE — Progress Notes (Signed)
PROGRESS NOTE  Jimmy West BPZ:[id number] DOB: September 27, 1950   PCP: Pcp, No  Patient is from: Home.  Uses rolling walker at baseline.  DOA: 05/07/2021 LOS: 6  Chief complaints:  Chief Complaint  Patient presents with   Shortness of Breath     Brief Narrative / Interim history: 70 year old M with PMH of stage IV RLL lung cancer, cancer pain, CAD/stent in 2005, chronic respiratory failure with hypoxia on 2 L, anemia of chronic disease, anxiety, depression and debility presenting with generalized weakness, shortness of breath and dry cough, and admitted for acute on chronic hypoxic respiratory failure in the setting of LLL pneumonia and non-STEMI.  Started on broad-spectrum antibiotics, steroid and IV heparin.  Cardiology consulted recommended medical management and [facility] outpatient.  CTA chest negative for PE.  Continues to require up to 6 L by Black at rest.  Subjective: Seen and examined earlier this morning.  No major events overnight of this morning.  Reports slight improvement in his breathing but continues to require 6 L by  at rest.  No chest pain.  Objective: Vitals:   05/13/21 0100 05/13/21 0417 05/13/21 0755 05/13/21 0835  BP:  125/85  130/76  Pulse:  74  96  Resp:  18  18  Temp:  97.7 F (36.5 C)  98 F (36.7 C)  TempSrc:    Oral  SpO2: 96% 97% 94% 95%  Weight:      Height:        Intake/Output Summary (Last 24 hours) at 05/13/2021 0947 Last data filed at 05/13/2021 0834 Gross per 24 hour  Intake 730 ml  Output 2390 ml  Net -1660 ml   Filed Weights   05/07/21 1439 05/10/21 0901 05/10/21 1537  Weight: 58.4 kg 63.2 kg 64 kg    Examination:  GENERAL: Frail looking elderly male.  No apparent distress. HEENT: MMM.  RESP: 90% on 6 L.  Hardly speaks in full sentence.  Crackles over LLL.  Diminished aeration.  Few scatterec rhonchi CVS:  RRR. Heart sounds normal.  ABD/GI/GU: BS+. Abd soft, NTND.  MSK/EXT:  Moves extremities. No apparent deformity. No  edema.  SKIN: no apparent skin lesion or wound. Right subclavian portacath NEURO: Awake and alert. Oriented appropriately.  No apparent focal neuro deficit. PSYCH: Calm. Normal affect.   Procedures:  None  Microbiology summarized: EUMPN-36 and influenza PCR nonreactive. MRSA PCR screen negative. Blood cultures NGTD.  Assessment & Plan: Acute on chronic respiratory failure with hypoxia due to LLL pneumonia with superimposed stage IV RLL lung cancer-on 2 L at baseline.  Currently requiring 6 L.  CTA chest negative for PE but concern for LLL pneumonia with RLL scarring from known lung cancer.  Pro-Cal elevated to 1.38.  Blood cultures negative. On cefepime since 10/18 making today day 7. S/p vancomycin - stop cefepime after today's dose, will have completed 7 days. Will also stop azithromycin after today's dose, will have completed 3 days of 500 - check CXR to eval for parapneumonic effusion which would require extended course abx - stop solumedrol, start oral prednisone, plan to wean -Wean oxygen as able. -IS/OOB/PT/OT - palliative consulted - I've also made Dr. B of oncology aware of the patient  Severe sepsis due to pneumonia:tachycardia, tachypnea, leukocytosis, AKI with pneumonia POA. Sepsis physiology resolved -Managementas above.   NSTEMI/history of CAD/stent in 2005: Troponin 3153 -> 3506 -> 2927 -> 2894.  EKG with T wave changes in lateral leads but no dynamic changes.  TTE with LVEF of  55 to 60%, G1-DD and no RWMA chest pain-free.  -Cardiology following -Completed 48 hours of IV heparin and transition back to home Eliquis. -Medical management due to poor candidacy for heart cath in the setting of respiratory failure -Cardiology recommends outpatient follow-up when stable from respiratory standpoint.  Chronic diastolic CHF: TTE as above.  Appears euvolemic on exam.  Does not seem to be on diuretics at home.  About 1.6 L UOP/24 hours. -Monitor fluid status   AKI/azotemia on  CKD-3A: Improved.  Does not seem to be on nephrotoxic meds.  Could be hemodynamically mediated Recent Labs    04/26/21 0610 04/27/21 0452 04/28/21 0541 05/07/21 1446 05/08/21 0552 05/09/21 0701 05/10/21 0517 05/11/21 0555 05/12/21 0530 05/13/21 0515  BUN 24* 32* 31* 27* 22 30* 36* 42* 48* 41*  CREATININE 1.24 1.17 1.30* 1.71* 1.50* 1.44* 1.74* 1.39* 1.32* 1.22  -Continue monitoring -Avoid nephrotoxic meds.  Hypotension: Resolved.   Stage IV lung cancer s/p radiation and chemotherapy previously.   -Outpatient follow-up - oncology notified of patient's presence   Anemia of chronic disease: H&H stable after 1 unit Recent Labs    04/25/21 0513 04/26/21 0610 04/27/21 0452 04/28/21 0541 05/07/21 1446 05/08/21 0552 05/09/21 0701 05/10/21 0517 05/11/21 0555 05/12/21 0530  HGB 9.1* 8.8* 8.3* 9.7* 9.9* 9.4* 8.1* 7.6* 9.3* 9.8*  -Continue monitoring   Chronic pain syndrome: -Continue oxycodone and bowel regimen as needed.   Depression and Anxiety: Stable -Continue with Fluoxetine 40 g p.o. daily as well as Olanzapine 10 mg p.o. nightly   Hx of PE: CTA chest negative for PE. -Continue home Eliquis  Physical deconditioning-uses rolling walker at baseline. -Therapy recommends SNF  Leukocytosis/bandemia-likely from steroid.  Goal of care counseling-patient with comorbidity as above.  Very frail and deconditioned.  Poor long-term prognosis.  Appropriately DNR/DNI.  A candidate for hospice -Palliative medicine consulted.  Body mass index is 22.77 kg/m.         DVT prophylaxis:   apixaban (ELIQUIS) tablet 5 mg  Code Status: DNR/DNI Family Communication: Patient and/or RN. Available if any question.  Level of care: Progressive Cardiac Status is: Inpatient  Remains inpatient appropriate because: Due to significant oxygen requirement, work of breathing and need for IV medications and close monitoring       Consultants:  Cardiology Palliative  medicine   Sch Meds:  Scheduled Meds:  apixaban  5 mg Oral BID   atorvastatin  40 mg Oral Daily   budesonide (PULMICORT) nebulizer solution  0.5 mg Nebulization BID   Chlorhexidine Gluconate Cloth  6 each Topical Daily   feeding supplement  237 mL Oral TID BM   FLUoxetine  40 mg Oral Daily   guaiFENesin  1,200 mg Oral BID   ipratropium-albuterol  3 mL Nebulization Q4H   methylPREDNISolone (SOLU-MEDROL) injection  40 mg Intravenous Q12H   OLANZapine  10 mg Oral QHS   Continuous Infusions:  azithromycin 500 mg (05/12/21 1726)   ceFEPime (MAXIPIME) IV 2 g (05/12/21 2121)   magnesium sulfate bolus IVPB     PRN Meds:.acetaminophen, ALPRAZolam, docusate sodium, guaiFENesin-dextromethorphan, ondansetron (ZOFRAN) IV, ondansetron, oxyCODONE, polyethylene glycol, sodium chloride  Antimicrobials: Anti-infectives (From admission, onward)    Start     Dose/Rate Route Frequency Ordered Stop   05/11/21 1630  azithromycin (ZITHROMAX) 500 mg in sodium chloride 0.9 % 250 mL IVPB        500 mg 250 mL/hr over 60 Minutes Intravenous Every 24 hours 05/11/21 1537 05/14/21 1629   05/08/21 1800  vancomycin (VANCOREADY) IVPB 750 mg/150 mL  Status:  Discontinued        750 mg 150 mL/hr over 60 Minutes Intravenous Every 24 hours 05/08/21 1135 05/09/21 1448   05/08/21 0800  ceFEPIme (MAXIPIME) 2 g in sodium chloride 0.9 % 100 mL IVPB        2 g 200 mL/hr over 30 Minutes Intravenous Every 12 hours 05/07/21 1929     05/07/21 2200  ceFEPIme (MAXIPIME) 2 g in sodium chloride 0.9 % 100 mL IVPB  Status:  Discontinued        2 g 200 mL/hr over 30 Minutes Intravenous Every 12 hours 05/07/21 1927 05/07/21 1929   05/07/21 1930  vancomycin (VANCOREADY) IVPB 500 mg/100 mL        500 mg 100 mL/hr over 60 Minutes Intravenous  Once 05/07/21 1927 05/07/21 2144   05/07/21 1927  vancomycin variable dose per unstable renal function (pharmacist dosing)  Status:  Discontinued         Does not apply See admin instructions  05/07/21 1927 05/09/21 1448   05/07/21 1600  vancomycin (VANCOCIN) IVPB 1000 mg/200 mL premix        1,000 mg 200 mL/hr over 60 Minutes Intravenous  Once 05/07/21 1552 05/07/21 1801   05/07/21 1600  ceFEPIme (MAXIPIME) 2 g in sodium chloride 0.9 % 100 mL IVPB        2 g 200 mL/hr over 30 Minutes Intravenous  Once 05/07/21 1552 05/07/21 1700        I have personally reviewed the following labs and images: CBC: Recent Labs  Lab 05/07/21 1446 05/08/21 0552 05/09/21 0701 05/10/21 0517 05/11/21 0555 05/12/21 0530  WBC 16.6* 14.1* 9.5 14.0* 14.5* 15.3*  NEUTROABS 14.2*  --   --   --  14.0*  --   HGB 9.9* 9.4* 8.1* 7.6* 9.3* 9.8*  HCT 30.2* 28.5* 25.4* 24.1* 28.6* 30.0*  MCV 94.1 94.4 94.4 95.6 95.0 96.5  PLT 275 263 238 228 227 222   BMP &GFR Recent Labs  Lab 05/09/21 0701 05/10/21 0517 05/11/21 0555 05/12/21 0530 05/13/21 0515  NA 138 140 142 142 141  K 4.6 4.5 4.3 4.6 4.7  CL 103 103 105 105 103  CO2 26 26 28 29 29   GLUCOSE 129* 156* 131* 146* 120*  BUN 30* 36* 42* 48* 41*  CREATININE 1.44* 1.74* 1.39* 1.32* 1.22  CALCIUM 8.4* 8.9 8.9 9.0 9.1  MG 2.1 2.0 2.0 1.9 1.6*  PHOS  --  5.0* 4.8* 2.6 2.4*   Estimated Creatinine Clearance: 50.8 mL/min (by C-G formula based on SCr of 1.22 mg/dL). Liver & Pancreas: Recent Labs  Lab 05/07/21 1446 05/11/21 0555 05/12/21 0530 05/13/21 0515  AST 22 20  --   --   ALT 15 19  --   --   ALKPHOS 80 74  --   --   BILITOT 0.9 0.8  --   --   PROT 6.5 5.5*  --   --   ALBUMIN 2.6* 2.3* 2.4* 2.5*   No results for input(s): LIPASE, AMYLASE in the last 168 hours. No results for input(s): AMMONIA in the last 168 hours. Diabetic: No results for input(s): HGBA1C in the last 72 hours. Recent Labs  Lab 05/12/21 1153 05/12/21 1621 05/12/21 2022  GLUCAP 170* 177* 132*   Cardiac Enzymes: No results for input(s): CKTOTAL, CKMB, CKMBINDEX, TROPONINI in the last 168 hours. No results for input(s): PROBNP in the last 8760  hours. Coagulation Profile: Recent  Labs  Lab 05/07/21 1446  INR 1.7*   Thyroid Function Tests: No results for input(s): TSH, T4TOTAL, FREET4, T3FREE, THYROIDAB in the last 72 hours. Lipid Profile: No results for input(s): CHOL, HDL, LDLCALC, TRIG, CHOLHDL, LDLDIRECT in the last 72 hours.  Anemia Panel: Recent Labs    05/12/21 0530  VITAMINB12 872  FOLATE 8.1  FERRITIN 1,120*  TIBC 276  IRON 59  RETICCTPCT 2.7   Urine analysis:    Component Value Date/Time   COLORURINE YELLOW (A) 05/07/2021 2044   APPEARANCEUR CLEAR (A) 05/07/2021 2044   LABSPEC 1.021 05/07/2021 2044   PHURINE 6.0 05/07/2021 2044   GLUCOSEU NEGATIVE 05/07/2021 2044   HGBUR NEGATIVE 05/07/2021 2044   BILIRUBINUR NEGATIVE 05/07/2021 2044   [place] 05/07/2021 2044   PROTEINUR NEGATIVE 05/07/2021 2044   NITRITE NEGATIVE 05/07/2021 2044   LEUKOCYTESUR NEGATIVE 05/07/2021 2044   Sepsis Labs: Invalid input(s): PROCALCITONIN, [place]  Microbiology: Recent Results (from the past 240 hour(s))  Culture, blood (Routine x 2)     Status: None   Collection Time: 05/07/21  2:46 PM   Specimen: BLOOD  Result Value Ref Range Status   Specimen Description BLOOD PORTA CATH  Final   Special Requests   Final    BOTTLES DRAWN AEROBIC AND ANAEROBIC Blood Culture results may not be optimal due to an excessive volume of blood received in culture bottles   Culture   Final    NO GROWTH 5 DAYS Performed at [facility], [address] [place], [place] [address]    Report Status 05/12/2021 FINAL  Final  Culture, blood (Routine x 2)     Status: None   Collection Time: 05/07/21  2:46 PM   Specimen: BLOOD  Result Value Ref Range Status   Specimen Description BLOOD RIGHT ANTECUBITAL  Final   Special Requests   Final    BOTTLES DRAWN AEROBIC AND ANAEROBIC Blood Culture results may not be optimal due to an excessive volume of blood received in culture bottles   Culture   Final    NO GROWTH 5  DAYS Performed at [facility], [address] [place], [place] [address]    Report Status 05/12/2021 FINAL  Final  Resp Panel by RT-PCR (Flu A&B, Covid) Nasopharyngeal Swab     Status: None   Collection Time: 05/07/21  5:57 PM   Specimen: Nasopharyngeal Swab; Nasopharyngeal(NP) swabs in vial transport medium  Result Value Ref Range Status   SARS Coronavirus 2 by RT PCR NEGATIVE NEGATIVE Final    Comment: (NOTE) SARS-CoV-2 target nucleic acids are NOT DETECTED.  The SARS-CoV-2 RNA is generally detectable in upper respiratory specimens during the acute phase of infection. The lowest concentration of SARS-CoV-2 viral copies this assay can detect is 138 copies/mL. A negative result does not preclude SARS-Cov-2 infection and should not be used as the sole basis for treatment or other patient management decisions. A negative result may occur with  improper specimen collection/handling, submission of specimen other than nasopharyngeal swab, presence of viral mutation(s) within the areas targeted by this assay, and inadequate number of viral copies(<138 copies/mL). A negative result must be combined with clinical observations, patient history, and epidemiological information. The expected result is Negative.  Fact Sheet for Patients:  EntrepreneurPulse.com.au  Fact Sheet for Healthcare Providers:  IncredibleEmployment.be  This test is no t yet approved or cleared by the [place] FDA and  has been authorized for detection and/or diagnosis of SARS-CoV-2 by FDA under an Emergency Use  Authorization (EUA). This EUA will remain  in effect (meaning this test can be used) for the duration of the COVID-19 declaration under Section 564(b)(1) of the Act, 21 U.S.C.section 360bbb-3(b)(1), unless the authorization is terminated  or revoked sooner.       Influenza A by PCR NEGATIVE NEGATIVE Final   Influenza B by PCR NEGATIVE NEGATIVE Final     Comment: (NOTE) The Xpert Xpress SARS-CoV-2/FLU/RSV plus assay is intended as an aid in the diagnosis of influenza from Nasopharyngeal swab specimens and should not be used as a sole basis for treatment. Nasal washings and aspirates are unacceptable for Xpert Xpress SARS-CoV-2/FLU/RSV testing.  Fact Sheet for Patients: EntrepreneurPulse.com.au  Fact Sheet for Healthcare Providers: IncredibleEmployment.be  This test is not yet approved or cleared by the [place] FDA and has been authorized for detection and/or diagnosis of SARS-CoV-2 by FDA under an Emergency Use Authorization (EUA). This EUA will remain in effect (meaning this test can be used) for the duration of the COVID-19 declaration under Section 564(b)(1) of the Act, 21 U.S.C. section 360bbb-3(b)(1), unless the authorization is terminated or revoked.  Performed at [facility], [address] [place], [place] [address]   MRSA Next Gen by PCR, Nasal     Status: None   Collection Time: 05/09/21  1:16 PM   Specimen: Nasal Mucosa; Nasal Swab  Result Value Ref Range Status   MRSA by PCR Next Gen NOT DETECTED NOT DETECTED Final    Comment: (NOTE) The GeneXpert MRSA Assay (FDA approved for NASAL specimens only), is one component of a comprehensive MRSA colonization surveillance program. It is not intended to diagnose MRSA infection nor to guide or monitor treatment for MRSA infections. Test performance is not FDA approved in patients less than 75 years old. Performed at [facility] [address] [place], [place] [address]     Radiology Studies: No results found.     Laurey Arrow, MD [facility]  If 7PM-7AM, please contact night-coverage [email or web address] 05/13/2021, 9:47 AM

## 2021-05-13 NOTE — Care Management Important Message (Signed)
Important Message  Patient Details  Name: Jimmy West MRN: [id number] Date of Birth: 03-29-1951   Medicare Important Message Given:  Yes     Dannette Barbara 05/13/2021, 4:09 PM

## 2021-05-13 NOTE — Progress Notes (Signed)
Physical Therapy Treatment Patient Details Name: Jimmy West MRN: [id number] DOB: [birth year] Today's Date: 05/13/2021   History of Present Illness 70 y.o. male was brought to hosp to admit 10/18 for hypoxia, weakness, SOB and noted sepsis from PNA, as well as NSTEMI.  Troponins are high from demand ischemia, EF 40-45%, and has recent asp PNA per chart.  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 Pt requested in bed mobility after recently returning from imaging and working with OT in the AM. Performed in bed therapeutic exercises with resistance to LE for maintaining current strength. Pt does demonstrate SOB with activity on 6L HFNC and decreaed exercise endurance and tolerance. SpO2 monitored throughout activity with appropriate rest breaks to maintain SpO2 > 88%, average 90-91% throughout treatment. Pt does demonstrate motivation to improve functional mobility, endurance, and strength as they are primary limitations to full independence. SNF remains current discharge recommendation and will update with further OOB mobility assessments. Skilled PT intervention is indicated to address deficits in function, mobility, and to return to PLOF as able.     Recommendations for follow up therapy are one component of a multi-disciplinary discharge planning process, led by the attending physician.  Recommendations may be updated based on patient status, additional functional criteria and insurance authorization.  Follow Up Recommendations  Skilled nursing-short term rehab (<3 hours/day)     Assistance Recommended at Discharge    Equipment Recommendations  None recommended by PT    Recommendations for Other Services       Precautions / Restrictions  Precautions Precautions: Fall Precaution Comments: monitor sats and HR Restrictions Weight Bearing Restrictions: No     Mobility  Bed Mobility               General bed mobility comments: Pt requested in bed exercises due to recently returning to room from imaging and recently completing OT session    Transfers                        Ambulation/Gait                 Stairs             Wheelchair Mobility    Modified Rankin (Stroke Patients Only)       Balance                                            Cognition Arousal/Alertness: Awake/alert Behavior During Therapy: WFL for tasks assessed/performed Overall Cognitive Status: Within Functional Limits for tasks assessed                                 General Comments: pleasant and cooperative        Exercises General Exercises - Lower Extremity Ankle Circles/Pumps: AROM;Both;10 reps;Supine Hip ABduction/ADduction: AROM;Strengthening;Both;15 reps (hand provided resistance) Straight Leg Raises: AROM;Strengthening;Both;10 reps (hand provided resistance) Heel Raises: AROM;Both;10 reps;Supine (end of bed for push off) Other Exercises Other Exercises: Bridging w/ HOB elevated x 10; Scooting in bed x 5    General Comments  Pertinent Vitals/Pain Pain Assessment: No/denies pain    Home Living                          Prior Function            PT Goals (current goals can now be found in the care plan section) Progress towards PT goals: Progressing toward goals    Frequency    Min 2X/week      PT Plan Current plan remains appropriate    Co-evaluation              AM-PAC PT "6 Clicks" Mobility   Outcome Measure  Help needed turning from your back to your side while in a flat bed without using bedrails?: None Help needed moving from lying on your back to sitting on the side of a flat bed without using bedrails?:  None Help needed moving to and from a bed to a chair (including a wheelchair)?: A Little Help needed standing up from a chair using your arms (e.g., wheelchair or bedside chair)?: A Little Help needed to walk in hospital room?: A Little Help needed climbing 3-5 steps with a railing? : Total 6 Click Score: 18    End of Session Equipment Utilized During Treatment: Oxygen Activity Tolerance: Patient tolerated treatment well Patient left: in bed;with call bell/phone within reach;with bed alarm set   PT Visit Diagnosis: Muscle weakness (generalized) (M62.81);Difficulty in walking, not elsewhere classified (R26.2);Other (comment)     Time: 1145-1200 PT Time Calculation (min) (ACUTE ONLY): 15 min  Charges:                       [employer], SPT

## 2021-05-13 NOTE — TOC Initial Note (Signed)
Transition of Care [facility]) - Initial/Assessment Note    Patient Details  Name: Jimmy West MRN: [id number] Date of Birth: [birth year]  Transition of Care [facility]) CM/SW Contact:    Alberteen Sam, LCSW Phone Number: 05/13/2021, 9:45 AM  Clinical Narrative:                  CSW spoke with patient regarding PT/OT recommendations of SNF. Patient reports he is in agreement with no preference of facility. CSW informed him referrals will be sent out locally to facilities for bed offers for patient to choose from.   CSW notes patient has [place] in which once facility is identified and chosen, facility will start insurance auth.   Referrals sent, pending bed offers at this time.   Expected Discharge Plan: Skilled Nursing Facility Barriers to Discharge: No Barriers Identified   Patient Goals and CMS Choice Patient states their goals for this hospitalization and ongoing recovery are:: to go home CMS Medicare.gov Compare Post Acute Care list provided to:: Patient Choice offered to / list presented to : Patient  Expected Discharge Plan and Services Expected Discharge Plan: [place] Choice: [place] arrangements for the past 2 months: Single Family Home                                      Prior Living Arrangements/Services Living arrangements for the past 2 months: Single Family Home Lives with:: Self                   Activities of Daily Living Home Assistive Devices/Equipment: Cane (specify quad or straight) ADL Screening (condition at time of admission) Patient's cognitive ability adequate to safely complete daily activities?: Yes Is the patient deaf or have difficulty hearing?: No Does the patient have difficulty seeing, even when wearing glasses/contacts?: No Does the patient have difficulty concentrating, remembering, or making decisions?: No Patient able to express need for assistance with ADLs?:  Yes Does the patient have difficulty dressing or bathing?: No Independently performs ADLs?: Yes (appropriate for developmental age) Does the patient have difficulty walking or climbing stairs?: Yes Weakness of Legs: None Weakness of Arms/Hands: None  Permission Sought/Granted                  Emotional Assessment Appearance:: Appears stated age Attitude/Demeanor/Rapport: Gracious Affect (typically observed): Calm Orientation: : Oriented to Self, Oriented to Place, Oriented to  Time, Oriented to Situation Alcohol / Substance Use: Not Applicable Psych Involvement: No (comment)  Admission diagnosis:  Hypoxia [R09.02] Sepsis ([place]) [A41.9] Acute hypoxemic respiratory failure (HCC) [J96.01] Dyspnea, unspecified type [R06.00] Sepsis after obstetrical procedure [O86.04] Patient Active Problem List   Diagnosis Date Noted   Severe protein-calorie malnutrition ([place]) 05/11/2021   Non-ST elevation (NSTEMI) myocardial infarction ([place]) 05/11/2021   SOB (shortness of breath)    Acute hypoxemic respiratory failure ([place]) 05/07/2021   Pneumonia 04/25/2021   Acute anemia 04/24/2021   Acute hypokalemia 04/24/2021   NSTEMI (non-ST elevated myocardial infarction) ([place]) 04/24/2021   Diarrhea 04/24/2021   Hypercalcemia 11/26/2020   Nonintractable headache 07/01/2020   Chronic anticoagulation 05/02/2020   Pleuritic pain 03/26/2020   Chemotherapy-induced neutropenia ([place]) 01/06/2020   Depression 12/21/2019   Palliative care encounter 08/26/2019   Anemia due to antineoplastic chemotherapy 06/27/2019   C. difficile diarrhea 06/26/2019   Pulmonary embolus ([place]) 06/20/2019  Anxiety in cancer patient 03/29/2019   Renal insufficiency 03/29/2019   Other fatigue 03/04/2019   Vomiting 03/04/2019   Chemotherapy induced nausea and vomiting 02/12/2019   Abdominal pain 02/11/2019   Mild protein-calorie malnutrition ([place]) 10/10/2018   Weight loss 09/20/2018   Elevated bilirubin 07/14/2018    Encounter for antineoplastic chemotherapy 05/24/2018   Encounter for antineoplastic immunotherapy 05/24/2018   Goals of care, counseling/discussion 05/17/2018   Cancer-related pain 05/01/2018   Bone metastasis ([place]) 04/30/2018   Primary cancer of right lower lobe of lung ([place]) 04/26/2018   Chronic midline low back pain without sciatica 04/26/2018   PCP:  Pcp, No Pharmacy:   [place], [place] [address] [place] [address] Phone: [phone] Fax: [place], [place] [address] [place] [address] [place] [address] Phone: [phone] Fax: [phone]     Social Determinants of Health (SDOH) Interventions    Readmission Risk Interventions Readmission Risk Prevention Plan 04/26/2021  Transportation Screening Complete  PCP or Specialist Appt within 3-5 Days Complete  HRI or [place] Complete  Social Work Consult for [place] Planning/Counseling Complete  Palliative Care Screening Not Applicable  Medication Review Press photographer) Complete  Some recent data might be hidden

## 2021-05-13 NOTE — Progress Notes (Signed)
Patient taken for chest xray at 10:45 am. Mg Sulphate, Cefepime and Normal Saline going. Alert and Oriented. Complained of Pain 8/10. Oxycodone 15 mg administered.

## 2021-05-13 NOTE — Telephone Encounter (Signed)
Furth, Cadence H, PA-C  P Cv Div [place] Triage; P Cv Div [place] Scheduling Pt needs outpatient Myoview lexiscan for new cardiomyopathy and hjospitla follow-up.

## 2021-05-13 NOTE — Telephone Encounter (Signed)
Order placed for outpatient lexiscan. Patient is currently admitted. Message fwd to scheduling to call the pt to schedule the stress test and hospital f/u appt.   [place]  Your caregiver has ordered a Stress Test with nuclear imaging. The purpose of this test is to evaluate the blood supply to your heart muscle. This procedure is referred to as a "Non-Invasive Stress Test." This is because other than having an IV started in your vein, nothing is inserted or "invades" your body. Cardiac stress tests are done to find areas of poor blood flow to the heart by determining the extent of coronary artery disease (CAD). Some patients exercise on a treadmill, which naturally increases the blood flow to your heart, while others who are  unable to walk on a treadmill due to physical limitations have a pharmacologic/chemical stress agent called Lexiscan . This medicine will mimic walking on a treadmill by temporarily increasing your coronary blood flow.   Please note: these test may take anywhere between 2-4 hours to complete  PLEASE REPORT TO [place] AT THE FIRST DESK WILL DIRECT YOU WHERE TO GO  Date of Procedure:_____________________________________  Arrival Time for Procedure:______________________________  Instructions regarding medication:   PLEASE NOTIFY THE OFFICE AT LEAST 24 HOURS IN ADVANCE IF YOU ARE UNABLE TO KEEP YOUR APPOINTMENT.  [phone] AND  PLEASE NOTIFY NUCLEAR MEDICINE AT [facility] AT LEAST 24 HOURS IN ADVANCE IF YOU ARE UNABLE TO KEEP YOUR APPOINTMENT. [phone]  How to prepare for your Myoview test:  Do not eat or drink after midnight No caffeine for 24 hours prior to test No smoking 24 hours prior to test. Your medication may be taken with water.  If your doctor stopped a medication because of this test, do not take that medication. Please wear a short sleeve shirt. No perfume, cologne or lotion. Wear comfortable walking shoes.

## 2021-05-13 NOTE — NC FL2 (Signed)
[place] LEVEL OF CARE SCREENING TOOL     IDENTIFICATION  Patient Name: Jimmy West Birthdate: 03-22-1951 Sex: male Admission Date (Current Location): 05/07/2021  [facility] and [place] Number:  Engineering geologist and Address:  [facility], [address], [place], [place] [address]      Provider Number: [id number]  Attending Physician Name and Address:  Gwynne Edinger, MD  Relative Name and Phone Number:  [name] (friend) [phone]    Current Level of Care: Hospital Recommended Level of Care: [place] Prior Approval Number:    Date Approved/Denied:   PASRR Number: [id number]  Discharge Plan: SNF    Current Diagnoses: Patient Active Problem List   Diagnosis Date Noted   Severe protein-calorie malnutrition ([place]) 05/11/2021   Non-ST elevation (NSTEMI) myocardial infarction ([place]) 05/11/2021   SOB (shortness of breath)    Acute hypoxemic respiratory failure ([place]) 05/07/2021   Pneumonia 04/25/2021   Acute anemia 04/24/2021   Acute hypokalemia 04/24/2021   NSTEMI (non-ST elevated myocardial infarction) ([place]) 04/24/2021   Diarrhea 04/24/2021   Hypercalcemia 11/26/2020   Nonintractable headache 07/01/2020   Chronic anticoagulation 05/02/2020   Pleuritic pain 03/26/2020   Chemotherapy-induced neutropenia ([place]) 01/06/2020   Depression 12/21/2019   Palliative care encounter 08/26/2019   Anemia due to antineoplastic chemotherapy 06/27/2019   C. difficile diarrhea 06/26/2019   Pulmonary embolus ([place]) 06/20/2019   Anxiety in cancer patient 03/29/2019   Renal insufficiency 03/29/2019   Other fatigue 03/04/2019   Vomiting 03/04/2019   Chemotherapy induced nausea and vomiting 02/12/2019   Abdominal pain 02/11/2019   Mild protein-calorie malnutrition (HCC) 10/10/2018   Weight loss 09/20/2018   Elevated bilirubin 07/14/2018   Encounter for antineoplastic chemotherapy 05/24/2018   Encounter for  antineoplastic immunotherapy 05/24/2018   Goals of care, counseling/discussion 05/17/2018   Cancer-related pain 05/01/2018   Bone metastasis ([place]) 04/30/2018   Primary cancer of right lower lobe of lung ([place]) 04/26/2018   Chronic midline low back pain without sciatica 04/26/2018    Orientation RESPIRATION BLADDER Height & Weight     Self, Time, Situation, Place  O2 (6L nasal cannula) Continent Weight: 141 lb 1.5 oz (64 kg) Height:  5\' 6"  (167.6 cm)  BEHAVIORAL SYMPTOMS/MOOD NEUROLOGICAL BOWEL NUTRITION STATUS      Continent Diet (see discharge summary)  AMBULATORY STATUS COMMUNICATION OF NEEDS Skin   Limited Assist Verbally Normal                       Personal Care Assistance Level of Assistance  Bathing, Dressing, Total care, Feeding Bathing Assistance: Independent Feeding assistance: Independent Dressing Assistance: Independent Total Care Assistance: Limited assistance   Functional Limitations Info  Sight, Speech, Hearing Sight Info: Adequate Hearing Info: Adequate Speech Info: Adequate    SPECIAL CARE FACTORS FREQUENCY  PT (By licensed PT), OT (By licensed OT)     PT Frequency: min 4x weekly OT Frequency: min 4x weekly            Contractures Contractures Info: Not present    Additional Factors Info  Code Status, Allergies Code Status Info: DNR Allergies Info: No Known Allergies           Current Medications (05/13/2021):  This is the current hospital active medication list Current Facility-Administered Medications  Medication Dose Route Frequency Provider Last Rate Last Admin   acetaminophen (TYLENOL) tablet 1,000 mg  1,000 mg Oral TID PRN Enzo Bi, MD  ALPRAZolam Duanne Moron) tablet 1 mg  1 mg Oral TID PRN Enzo Bi, MD   1 mg at 05/12/21 1725   apixaban (ELIQUIS) tablet 5 mg  5 mg Oral BID Theora Gianotti, NP   5 mg at 05/13/21 5176   atorvastatin (LIPITOR) tablet 40 mg  40 mg Oral Daily Theora Gianotti, NP   40 mg at  05/13/21 0939   azithromycin (ZITHROMAX) 500 mg in sodium chloride 0.9 % 250 mL IVPB  500 mg Intravenous Q24H Wendee Beavers T, MD 250 mL/hr at 05/12/21 1726 500 mg at 05/12/21 1726   budesonide (PULMICORT) nebulizer solution 0.5 mg  0.5 mg Nebulization BID Flora Lipps, MD   0.5 mg at 05/13/21 0755   ceFEPIme (MAXIPIME) 2 g in sodium chloride 0.9 % 100 mL IVPB  2 g Intravenous Q12H Enzo Bi, MD 200 mL/hr at 05/12/21 2121 2 g at 05/12/21 2121   Chlorhexidine Gluconate Cloth 2 % PADS 6 each  6 each Topical Daily Enzo Bi, MD   6 each at 05/13/21 0942   docusate sodium (COLACE) capsule 100 mg  100 mg Oral BID PRN Enzo Bi, MD       feeding supplement (ENSURE ENLIVE / ENSURE PLUS) liquid 237 mL  237 mL Oral TID BM Enzo Bi, MD   237 mL at 05/13/21 0936   FLUoxetine (PROZAC) capsule 40 mg  40 mg Oral Daily Enzo Bi, MD   40 mg at 05/13/21 0940   guaiFENesin (MUCINEX) 12 hr tablet 1,200 mg  1,200 mg Oral BID Raiford Noble Latif, DO   1,200 mg at 05/13/21 1607   guaiFENesin-dextromethorphan (ROBITUSSIN DM) 100-10 MG/5ML syrup 10 mL  10 mL Oral Q6H PRN Enzo Bi, MD   10 mL at 05/11/21 0929   ipratropium-albuterol (DUONEB) 0.5-2.5 (3) MG/3ML nebulizer solution 3 mL  3 mL Nebulization Q4H Flora Lipps, MD   3 mL at 05/13/21 0755   magnesium sulfate IVPB 2 g 50 mL  2 g Intravenous Once Wouk, Ailene Rud, MD       methylPREDNISolone sodium succinate (SOLU-MEDROL) 40 mg/mL injection 40 mg  40 mg Intravenous Q12H Flora Lipps, MD   40 mg at 05/13/21 0318   OLANZapine (ZYPREXA) tablet 10 mg  10 mg Oral QHS Enzo Bi, MD   10 mg at 05/12/21 2116   ondansetron (ZOFRAN) injection 4 mg  4 mg Intravenous Q6H PRN Enzo Bi, MD       ondansetron (ZOFRAN-ODT) disintegrating tablet 4 mg  4 mg Oral Q8H PRN Enzo Bi, MD       oxyCODONE (Oxy IR/ROXICODONE) immediate release tablet 10-15 mg  10-15 mg Oral Q4H PRN Enzo Bi, MD   15 mg at 05/13/21 0516   polyethylene glycol (MIRALAX / GLYCOLAX) packet 17 g  17 g Oral BID  PRN Enzo Bi, MD       sodium chloride (OCEAN) 0.65 % nasal spray 1 spray  1 spray Each Nare PRN Mercy Riding, MD       Facility-Administered Medications Ordered in Other Encounters  Medication Dose Route Frequency Provider Last Rate Last Admin   0.9 %  sodium chloride infusion   Intravenous Once Corcoran, Melissa C, MD       0.9 %  sodium chloride infusion   Intravenous Once PRN Corcoran, Melissa C, MD       0.9 %  sodium chloride infusion   Intravenous Continuous Cammie Sickle, MD   Stopped at 03/19/21 1151   0.9 %  sodium chloride infusion   Intravenous Once Charlaine [place] R, MD       albuterol (PROVENTIL) (2.5 MG/3ML) 0.083% nebulizer solution 2.5 mg  2.5 mg Nebulization Once PRN Corcoran, Drue Second, MD       alteplase (CATHFLO ACTIVASE) injection 2 mg  2 mg Intracatheter Once PRN Lequita Asal, MD       cyanocobalamin ((VITAMIN B-12)) injection 1,000 mcg  1,000 mcg Intramuscular Once [place]land Ormond, Kathlene November, MD       EPINEPHrine (ADRENALIN) 1 MG/10ML injection 0.25 mg  0.25 mg Intravenous Once PRN Lequita Asal, MD       EPINEPHrine (ADRENALIN) 1 MG/10ML injection 0.25 mg  0.25 mg Intravenous Once PRN Corcoran, Melissa C, MD       heparin lock flush 100 UNIT/ML injection            heparin lock flush 100 unit/mL  500 Units Intracatheter Once PRN Lequita Asal, MD       heparin lock flush 100 unit/mL  250 Units Intracatheter Once PRN Lequita Asal, MD       heparin lock flush 100 unit/mL  500 Units Intracatheter Once PRN Lloyd Huger, MD       sodium chloride flush (NS) 0.9 % injection 10 mL  10 mL Intravenous PRN Nolon Stalls C, MD   10 mL at 03/04/19 0959   sodium chloride flush (NS) 0.9 % injection 10 mL  10 mL Intracatheter Once PRN Lequita Asal, MD       sodium chloride flush (NS) 0.9 % injection 3 mL  3 mL Intracatheter Once PRN Lequita Asal, MD         Discharge Medications: Please see discharge summary for a list of  discharge medications.  Relevant Imaging Results:  Relevant Lab Results:   Additional Information SSN: [id number]  Alberteen Sam, LCSW

## 2021-05-14 ENCOUNTER — Inpatient Hospital Stay: Payer: Medicare HMO

## 2021-05-14 DIAGNOSIS — Z66 Do not resuscitate: Secondary | ICD-10-CM | POA: Diagnosis not present

## 2021-05-14 DIAGNOSIS — Z515 Encounter for palliative care: Secondary | ICD-10-CM | POA: Diagnosis not present

## 2021-05-14 DIAGNOSIS — J9601 Acute respiratory failure with hypoxia: Secondary | ICD-10-CM | POA: Diagnosis not present

## 2021-05-14 DIAGNOSIS — F32A Depression, unspecified: Secondary | ICD-10-CM

## 2021-05-14 DIAGNOSIS — R0603 Acute respiratory distress: Secondary | ICD-10-CM

## 2021-05-14 DIAGNOSIS — J189 Pneumonia, unspecified organism: Secondary | ICD-10-CM | POA: Diagnosis not present

## 2021-05-14 DIAGNOSIS — C3431 Malignant neoplasm of lower lobe, right bronchus or lung: Secondary | ICD-10-CM | POA: Diagnosis not present

## 2021-05-14 DIAGNOSIS — D6481 Anemia due to antineoplastic chemotherapy: Secondary | ICD-10-CM

## 2021-05-14 DIAGNOSIS — F419 Anxiety disorder, unspecified: Secondary | ICD-10-CM

## 2021-05-14 LAB — BASIC METABOLIC PANEL
Anion gap: 7 (ref 5–15)
BUN: 33 mg/dL — ABNORMAL HIGH (ref 8–23)
CO2: 32 mmol/L (ref 22–32)
Calcium: 9 mg/dL (ref 8.9–10.3)
Chloride: 104 mmol/L (ref 98–111)
Creatinine, Ser: 1.13 mg/dL (ref 0.61–1.24)
GFR, Estimated: >60 mL/min (ref >60)
Glucose, Bld: 96 mg/dL (ref 70–99)
Potassium: 4 mmol/L (ref 3.5–5.1)
Sodium: 143 mmol/L (ref 135–145)

## 2021-05-14 LAB — BLOOD GAS, ARTERIAL
Acid-Base Excess: 6.9 mmol/L — ABNORMAL HIGH (ref 0.0–2.0)
Bicarbonate: 30.3 mmol/L — ABNORMAL HIGH (ref 20.0–28.0)
FIO2: 0.6
O2 Saturation: 89.4 %
Patient temperature: 37
pCO2 arterial: 38 mmHg (ref 32.0–48.0)
pH, Arterial: 7.51 — ABNORMAL HIGH (ref 7.350–7.450)
pO2, Arterial: 51 mmHg — ABNORMAL LOW (ref 83.0–108.0)

## 2021-05-14 LAB — MAGNESIUM: Magnesium: 2.1 mg/dL (ref 1.7–2.4)

## 2021-05-14 MED ORDER — FUROSEMIDE 10 MG/ML IJ SOLN
20.0000 mg | Freq: Two times a day (BID) | INTRAMUSCULAR | Status: DC
  Administered 2021-05-14 – 2021-05-16 (×4): 20 mg via INTRAVENOUS
  Filled 2021-05-14 (×4): qty 2

## 2021-05-14 MED ORDER — MORPHINE SULFATE (PF) 2 MG/ML IV SOLN
2.0000 mg | Freq: Once | INTRAVENOUS | Status: AC | PRN
  Administered 2021-05-14: 2 mg via INTRAVENOUS
  Filled 2021-05-14: qty 1

## 2021-05-14 MED ORDER — IPRATROPIUM-ALBUTEROL 0.5-2.5 (3) MG/3ML IN SOLN
3.0000 mL | Freq: Three times a day (TID) | RESPIRATORY_TRACT | Status: DC
  Administered 2021-05-14 – 2021-05-18 (×12): 3 mL via RESPIRATORY_TRACT
  Filled 2021-05-14 (×12): qty 3

## 2021-05-14 NOTE — Telephone Encounter (Signed)
Pt currently admitted 05/14/21.  Will follow up after discharge.

## 2021-05-14 NOTE — Consult Note (Signed)
[facility]  [phone] [phone]   Name: Jimmy West Date: 05/14/2021 MRN: [id number]  DOB: [birth year]  Patient Care Team: Pcp, No as PCP - General Kate Sable, MD as PCP - Cardiology (Cardiology) Telford Nab, RN as Registered Nurse Burlene Arnt, Gerda Diss, MD as Attending Physician (Emergency Medicine) Lebron Conners, [place] (Physician Assistant) Wonda Horner, MD as Referring Physician (Neurosurgery) Jason Coop, NP as Nurse Practitioner Cammie Sickle, MD as Consulting Physician (Hematology and Oncology)    REASON FOR CONSULTATION: Jimmy West is a 70 y.o. 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  Patient has had chronic neoplasm related pain and severe anxiety/depression and has been followed chronically in the palliative care clinic.  Patient was hospitalized 04/24/2021 -04/28/2021 and treated for possible aspiration pneumonia.  He is now readmitted 05/07/2021 with hypoxic respiratory failure concerning for worsening pneumonia.  Palliative care is consulted over address goals  SOCIAL HISTORY:     reports that he quit smoking about 17 years ago. His smoking use included cigarettes. He has a 22.50 pack-year smoking history. He has never used smokeless tobacco. He reports that he does not currently use alcohol. He reports that he does not use drugs.  Patient has not married and has no children.  He has a brother who lives out of state.  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   *INTOLERANT to carboplatin at an AUC of 5 as well as 2. # NGS/MOLECULAR TESTS:[employer] One revealed no driver mutations  # PALLIATIVE CARE EVALUATION:  # PAIN MANAGEMENT:    DIAGNOSIS:   STAGE:         ;  GOALS:  CURRENT/MOST RECENT THERAPY :     Primary cancer of right lower lobe of lung ([place])  04/26/2018 Initial Diagnosis   Primary cancer of right lower lobe of lung ([place])   05/24/2018 - 01/10/2019 Chemotherapy   The patient had dexamethasone (DECADRON) 4 MG tablet, 1 of 1 cycle, Start date: 05/17/2018, End date: 08/12/2018 pembrolizumab (KEYTRUDA) 200 mg in sodium chloride 0.9 % 50 mL chemo infusion, 200 mg, Intravenous, Once, 11 of 14 cycles Administration: 200 mg (05/24/2018), 200 mg (07/16/2018), 200 mg (08/09/2018), 200 mg (08/30/2018), 200 mg (09/20/2018), 200 mg (06/14/2018), 200 mg (10/11/2018), 200 mg (11/08/2018), 200 mg (11/29/2018), 200 mg (12/20/2018), 200 mg (01/10/2019)   for chemotherapy treatment.     02/08/2019 - 04/12/2020 Chemotherapy   The patient had dexamethasone (DECADRON) 4 MG tablet, 1 of 1 cycle, Start date: 04/23/2020, End date: -- palonosetron (ALOXI) injection 0.25 mg, 0.25 mg, Intravenous,  Once, 21 of 23 cycles  Administration: 0.25 mg (02/08/2019), 0.25 mg (03/29/2019), 0.25 mg (04/18/2019), 0.25 mg (02/28/2019), 0.25 mg (05/09/2019), 0.25 mg (05/30/2019), 0.25 mg (06/27/2019), 0.25 mg (07/18/2019), 0.25 mg (08/08/2019), 0.25 mg (08/30/2019), 0.25 mg (09/20/2019), 0.25 mg (10/11/2019), 0.25 mg (11/08/2019), 0.25 mg (11/29/2019), 0.25 mg (12/21/2019), 0.25 mg (01/18/2020), 0.25  mg (02/08/2020), 0.25 mg (02/29/2020), 0.25 mg (03/22/2020), 0.25 mg (04/11/2020) pegfilgrastim-jmdb (FULPHILA) injection 6 mg, 6 mg, Subcutaneous,  Once, 4 of 6 cycles Administration: 6 mg (03/23/2020), 6 mg (04/12/2020) PEMEtrexed (ALIMTA) 900 mg in sodium chloride 0.9 % 100 mL chemo infusion, 500 mg/m2 = 900 mg, Intravenous,  Once, 21 of 23 cycles Dose modification: 400 mg/m2 (original dose 500 mg/m2, Cycle 14, Reason: Provider Judgment, Comment: change in reanl function, although CrCl > 45 ml/min) Administration: 900 mg (02/08/2019), 900 mg (03/29/2019), 900 mg (04/18/2019), 900 mg (05/09/2019), 900 mg (05/30/2019), 900 mg (02/28/2019), 900 mg (06/27/2019), 900 mg (07/18/2019), 900 mg (08/08/2019), 900 mg (08/30/2019), 900 mg (09/20/2019), 900 mg (10/11/2019), 900 mg (11/08/2019), 700 mg (11/29/2019), 700 mg (01/18/2020), 700 mg (02/08/2020), 700 mg (02/29/2020), 700 mg (12/21/2019), 700 mg (03/22/2020), 700 mg (04/11/2020) CARBOplatin (PARAPLATIN) 500 mg in sodium chloride 0.9 % 250 mL chemo infusion, 470 mg (100 % of original dose 472 mg), Intravenous,  Once, 2 of 2 cycles Dose modification:   (original dose 472 mg, Cycle 1) Administration: 500 mg (02/08/2019), 190 mg (04/18/2019) pembrolizumab (KEYTRUDA) 200 mg in sodium chloride 0.9 % 50 mL chemo infusion, 200 mg, Intravenous, Once, 21 of 23 cycles Administration: 200 mg (02/08/2019), 200 mg (03/29/2019), 200 mg (04/18/2019), 200 mg (05/09/2019), 200 mg (05/30/2019), 200 mg (02/28/2019), 200 mg (06/27/2019), 200 mg (07/18/2019), 200 mg (08/08/2019), 200 mg (08/30/2019), 200 mg (09/20/2019), 200 mg (10/11/2019), 200 mg (11/08/2019), 200 mg (11/29/2019), 200 mg (12/21/2019), 200 mg (01/18/2020), 200 mg (02/08/2020), 200 mg (02/29/2020), 200 mg (03/22/2020), 200 mg (04/11/2020) fosaprepitant (EMEND) 150 mg, dexamethasone (DECADRON) 12 mg in sodium chloride 0.9 % 145 mL IVPB, , Intravenous,  Once, 4 of 4 cycles Administration:  (02/08/2019),  (04/18/2019)   for chemotherapy treatment.     07/10/2020 -   Chemotherapy   Patient is on Treatment Plan : LUNG Maintenance Pemetrexed + Pembrolizumab       ALLERGIES:  has No Known Allergies.  MEDICATIONS:  Current Facility-Administered Medications  Medication Dose Route Frequency Provider Last Rate Last Admin   0.9 %  sodium chloride infusion   Intravenous PRN Gwynne Edinger, MD 10 mL/hr at 05/13/21 1620 10 mL/hr at 05/13/21 1620   acetaminophen (TYLENOL) tablet 1,000 mg  1,000 mg Oral TID PRN Enzo Bi, MD       ALPRAZolam Duanne Moron) tablet 1 mg  1 mg Oral TID PRN Enzo Bi, MD   1 mg at 05/14/21 0847   apixaban (ELIQUIS) tablet 5 mg  5 mg Oral BID Theora Gianotti, NP   5 mg at 05/14/21 0847   atorvastatin (LIPITOR) tablet 40 mg  40 mg Oral Daily Theora Gianotti, NP   40 mg at 05/14/21 0847   budesonide (PULMICORT) nebulizer solution 0.5 mg  0.5 mg Nebulization BID Flora Lipps, MD   0.5 mg at 05/14/21 0756   Chlorhexidine Gluconate Cloth 2 % PADS 6 each  6 each Topical Daily Enzo Bi, MD   6 each at 05/14/21 0849   docusate sodium (COLACE) capsule 100 mg  100 mg Oral BID PRN Enzo Bi, MD       feeding supplement (ENSURE ENLIVE / ENSURE PLUS) liquid 237 mL  237 mL Oral TID BM Enzo Bi, MD   237 mL at 05/13/21 0936   FLUoxetine (PROZAC) capsule 40 mg  40 mg Oral Daily Enzo Bi, MD   40 mg at 05/14/21 0847   guaiFENesin (MUCINEX) 12 hr tablet 1,200 mg  1,200 mg Oral BID Raiford Noble Latif, DO   1,200 mg at 05/14/21 0847   guaiFENesin-dextromethorphan (ROBITUSSIN DM) 100-10 MG/5ML syrup 10 mL  10 mL Oral Q6H PRN Enzo Bi, MD   10 mL at 05/11/21 0929   ipratropium-albuterol (DUONEB) 0.5-2.5 (3) MG/3ML nebulizer solution 3 mL  3 mL Nebulization TID Val Riles, MD       OLANZapine (ZYPREXA) tablet 10 mg  10 mg Oral QHS Enzo Bi, MD   10 mg at 05/13/21 2127   ondansetron (ZOFRAN) injection 4 mg  4 mg Intravenous Q6H PRN Enzo Bi, MD       ondansetron (ZOFRAN-ODT) disintegrating tablet 4 mg  4 mg Oral Q8H PRN Enzo Bi, MD        oxyCODONE (Oxy IR/ROXICODONE) immediate release tablet 10-15 mg  10-15 mg Oral Q4H PRN Enzo Bi, MD   15 mg at 05/14/21 0848   polyethylene glycol (MIRALAX / GLYCOLAX) packet 17 g  17 g Oral BID PRN Enzo Bi, MD       predniSONE (DELTASONE) tablet 40 mg  40 mg Oral Q breakfast Gwynne Edinger, MD   40 mg at 05/14/21 0847   sodium chloride (OCEAN) 0.65 % nasal spray 1 spray  1 spray Each Nare PRN Mercy Riding, MD       Facility-Administered Medications Ordered in Other Encounters  Medication Dose Route Frequency Provider Last Rate Last Admin   0.9 %  sodium chloride infusion   Intravenous Once Corcoran, Melissa C, MD       0.9 %  sodium chloride infusion   Intravenous Once PRN Corcoran, Melissa C, MD       0.9 %  sodium chloride infusion   Intravenous Continuous Cammie Sickle, MD   Stopped at 03/19/21 1151   0.9 %  sodium chloride infusion   Intravenous Once Charlaine Dalton R, MD       albuterol (PROVENTIL) (2.5 MG/3ML) 0.083% nebulizer solution 2.5 mg  2.5 mg Nebulization Once PRN Lequita Asal, MD       alteplase (CATHFLO ACTIVASE) injection 2 mg  2 mg Intracatheter Once PRN Lequita Asal, MD       cyanocobalamin ((VITAMIN B-12)) injection 1,000 mcg  1,000 mcg Intramuscular Once Lloyd Huger, MD       EPINEPHrine (ADRENALIN) 1 MG/10ML injection 0.25 mg  0.25 mg Intravenous Once PRN Lequita Asal, MD       EPINEPHrine (ADRENALIN) 1 MG/10ML injection 0.25 mg  0.25 mg Intravenous Once PRN Corcoran, Melissa C, MD       heparin lock flush 100 UNIT/ML injection            heparin lock flush 100 unit/mL  500 Units Intracatheter Once PRN Lequita Asal, MD       heparin lock flush 100 unit/mL  250 Units Intracatheter Once PRN Lequita Asal, MD       heparin lock flush 100 unit/mL  500 Units Intracatheter Once PRN Lloyd Huger, MD       sodium chloride flush (NS) 0.9 % injection 10 mL  10 mL Intravenous PRN Nolon Stalls C, MD   10 mL  at 03/04/19 0959   sodium chloride  flush (NS) 0.9 % injection 10 mL  10 mL Intracatheter Once PRN Lequita Asal, MD       sodium chloride flush (NS) 0.9 % injection 3 mL  3 mL Intracatheter Once PRN Lequita Asal, MD        VITAL SIGNS: BP 115/74 (BP Location: Right Arm)   Pulse 95   Temp 97.9 F (36.6 C) (Oral)   Resp 19   Ht 5' 6" (1.676 m)   Wt 141 lb 1.5 oz (64 kg)   SpO2 91%   BMI 22.77 kg/m  Filed Weights   05/07/21 1439 05/10/21 0901 05/10/21 1537  Weight: 128 lb 12 oz (58.4 kg) 139 lb 5.3 oz (63.2 kg) 141 lb 1.5 oz (64 kg)    Estimated body mass index is 22.77 kg/m as calculated from the following:   Height as of this encounter: 5' 6" (1.676 m).   Weight as of this encounter: 141 lb 1.5 oz (64 kg).  LABS: CBC:    Component Value Date/Time   WBC 15.3 (H) 05/12/2021 0530   HGB 9.8 (L) 05/12/2021 0530   HGB 14.9 04/06/2014 0826   HCT 30.0 (L) 05/12/2021 0530   HCT 44.8 04/06/2014 0826   PLT 222 05/12/2021 0530   PLT 238 04/06/2014 0826   MCV 96.5 05/12/2021 0530   MCV 95 04/06/2014 0826   NEUTROABS 14.0 (H) 05/11/2021 0555   NEUTROABS 3.9 04/06/2014 0826   LYMPHSABS 0.2 (L) 05/11/2021 0555   LYMPHSABS 1.1 04/06/2014 0826   MONOABS 0.3 05/11/2021 0555   MONOABS 0.7 04/06/2014 0826   EOSABS 0.0 05/11/2021 0555   EOSABS 0.1 04/06/2014 0826   BASOSABS 0.0 05/11/2021 0555   BASOSABS 0.1 04/06/2014 0826   Comprehensive Metabolic Panel:    Component Value Date/Time   NA 143 05/14/2021 0706   NA 140 04/06/2014 0826   K 4.0 05/14/2021 0706   K 4.0 04/06/2014 0826   CL 104 05/14/2021 0706   CL 108 (H) 04/06/2014 0826   CO2 32 05/14/2021 0706   CO2 25 04/06/2014 0826   BUN 33 (H) 05/14/2021 0706   BUN 12 04/06/2014 0826   CREATININE 1.13 05/14/2021 0706   CREATININE 1.17 04/06/2014 0826   GLUCOSE 96 05/14/2021 0706   GLUCOSE 106 (H) 04/06/2014 0826   CALCIUM 9.0 05/14/2021 0706   CALCIUM 8.9 04/06/2014 0826   AST 20 05/11/2021 0555   AST 24  04/06/2014 0826   ALT 19 05/11/2021 0555   ALT 33 04/06/2014 0826   ALKPHOS 74 05/11/2021 0555   ALKPHOS 85 04/06/2014 0826   BILITOT 0.8 05/11/2021 0555   BILITOT 0.8 04/06/2014 0826   PROT 5.5 (L) 05/11/2021 0555   PROT 7.3 04/06/2014 0826   ALBUMIN 2.5 (L) 05/13/2021 0515   ALBUMIN 3.8 04/06/2014 0826    RADIOGRAPHIC STUDIES: DG Chest 2 View  Result Date: 05/13/2021 CLINICAL DATA:  Shortness of breath.  History of lung cancer. EXAM: CHEST - 2 VIEW COMPARISON:  May 11, 2021. FINDINGS: Stable cardiomediastinal silhouette. Increased bilateral basilar airspace opacities are noted concerning for worsening pneumonia. Right internal jugular Port-A-Cath is unchanged in position. Old upper thoracic compression fracture is noted. IMPRESSION: Increased bibasilar airspace opacities are noted concerning for worsening pneumonia. Electronically Signed   By: Marijo Conception M.D.   On: 05/13/2021 14:12   DG Chest 2 View  Result Date: 04/24/2021 CLINICAL DATA:  Shortness of breath. History of stage IV lung cancer. EXAM: CHEST - 2 VIEW COMPARISON:  Chest radiographs 09/18/2020 and CT 11/01/2020 FINDINGS: A right jugular Port-A-Cath remains in place terminating over the right atrium. The cardiac silhouette is within normal limits in size. There is volume loss in the hemithorax with increasing confluent and patchy opacity in the right perihilar region and right lung base which partially obscures the known superior segment right lower lobe mass. There may be a trace right pleural effusion. The interstitial markings are mildly prominent throughout the left lung without focal consolidation. No pneumothorax is identified. No acute osseous abnormality is seen. IMPRESSION: Increasing right lower lung consolidation and volume loss. Electronically Signed   By: Logan Bores M.D.   On: 04/24/2021 11:36   CT Angio Chest PE W and/or Wo Contrast  Result Date: 05/07/2021 CLINICAL DATA:  Weakness and cough. History of  metastatic lung cancer. EXAM: CT ANGIOGRAPHY CHEST WITH CONTRAST TECHNIQUE: Multidetector CT imaging of the chest was performed using the standard protocol during bolus administration of intravenous contrast. Multiplanar CT image reconstructions and MIPs were obtained to evaluate the vascular anatomy. CONTRAST:  30m OMNIPAQUE IOHEXOL 350 MG/ML SOLN COMPARISON:  11/02/2019 FINDINGS: Cardiovascular: The heart is mildly enlarged but stable. No pericardial effusion. Stable tortuosity and calcification of the thoracic aorta but no aneurysm or dissection. The branch vessels are patent. Three-vessel coronary artery calcifications are noted. The right IJ power port is in unchanged position. No complicating features. The pulmonary arterial tree is well opacified. No filling defects to suggest pulmonary embolism. Mediastinum/Nodes: No mediastinal or hilar mass or lymphadenopathy the. The esophagus is grossly. Lungs/Pleura: Stable underlying advanced emphysematous changes and areas of pulmonary scarring. Right lower lobe lung mass measures approximately 2.8 x 2.2 cm and previously measured 3.3 x 3.2 cm. Some surrounding interstitial scarring changes likely related to radiation. Peripheral right upper lobe lesion on image number 29/6 is stable measuring a maximum of 13 mm. Diffuse interstitial and airspace process in both lungs is most likely an infectious process. Aspiration would be a consideration. There is also a small right pleural effusion. Upper Abdomen: Few tiny low-attenuation hepatic lesions are stable. Stable splenomegaly. Stable right renal cyst. Musculoskeletal: No chest wall mass, supraclavicular or axillary adenopathy. The bony thorax is intact. No worrisome bone lesions. Stable severe compression deformity of T4 with vertebral plana appearance. Review of the MIP images confirms the above findings. IMPRESSION: 1. No CT findings for pulmonary embolism. 2. Stable tortuosity and calcification of the thoracic aorta  but no dissection. 3. Interval decrease in size of the right lower lobe lung mass. The right upper lobe peripheral pulmonary nodule is stable. 4. Interstitial and airspace process in both lungs is likely an infectious process. Aspiration would be a consideration. Aortic Atherosclerosis (ICD10-I70.0) and Emphysema (ICD10-J43.9). Electronically Signed   By: PMarijo SanesM.D.   On: 05/07/2021 16:56   DG Chest Port 1 View  Result Date: 05/11/2021 CLINICAL DATA:  Shortness of breath in a 70year old male with history of pulmonary neoplasm. EXAM: PORTABLE CHEST 1 VIEW COMPARISON:  Most recent chest x-ray from May 07, 2021 and CT of the chest from May 07, 2021. FINDINGS: Diffuse interstitial and airspace opacities in the mid and lower lungs as on the recent chest x-ray. RIGHT-sided Port-A-Cath terminates in the RIGHT atrium. Cardiomediastinal contours are stable. No visible pneumothorax.  No gross effusion. On limited assessment there is no acute skeletal process. IMPRESSION: Diffuse interstitial and airspace opacities in the mid and lower lungs, similar to the recent chest x-ray. May be post infectious fibrotic changes or ongoing  pneumonia. Would also correlate with any risk factors for pneumonitis that could be related to ongoing therapy. Electronically Signed   By: Zetta Bills M.D.   On: 05/11/2021 08:14   DG Chest Port 1 View  Result Date: 05/07/2021 CLINICAL DATA:  Sepsis.  History of right lower lobe lung cancer. EXAM: PORTABLE CHEST 1 VIEW COMPARISON:  Chest x-ray 04/24/2021.  PET-CT 01/03/2021 FINDINGS: The right IJ power port is in stable position. The cardiac silhouette, mediastinal and hilar contours are stable. Extensive opacity in the right lower lobe most likely radiation fibrosis. New left lower lobe airspace process most consistent with pneumonia. No definite pleural effusions or pneumothorax. IMPRESSION: New left lower lobe airspace process most consistent with pneumonia. Extensive  right lower lobe density likely radiation change. Electronically Signed   By: Marijo Sanes M.D.   On: 05/07/2021 16:06   ECHOCARDIOGRAM COMPLETE  Result Date: 05/08/2021    ECHOCARDIOGRAM REPORT   Patient Name:   TOMMEY BARRET Date of Exam: 05/08/2021 Medical Rec #:  [id number]            Height:       66.0 in Accession #:    [id number]           Weight:       128.7 lb Date of Birth:  12-Jun-1951           BSA:          1.658 m Patient Age:    66 years             BP:           97/66 mmHg Patient Gender: M                    HR:           99 bpm. Exam Location:  [facility] Procedure: 2D Echo, Color Doppler and Cardiac Doppler Indications:     Elevated troponin  History:         Patient has no prior history of Echocardiogram examinations.                  Stage 4 lung cancer.  Sonographer:     Charmayne Sheer Referring Phys:  [id number] [place] Diagnosing Phys: Serafina Royals MD  Sonographer Comments: No subcostal window and suboptimal apical window. IMPRESSIONS  1. Left ventricular ejection fraction, by estimation, is 55 to 60%. The left ventricle has normal function. The left ventricle has no regional wall motion abnormalities. Left ventricular diastolic parameters are consistent with Grade I diastolic dysfunction (impaired relaxation).  2. Right ventricular systolic function is normal. The right ventricular size is normal.  3. The mitral valve is normal in structure. Mild mitral valve regurgitation.  4. The aortic valve is normal in structure. Aortic valve regurgitation is mild. FINDINGS  Left Ventricle: Left ventricular ejection fraction, by estimation, is 55 to 60%. The left ventricle has normal function. The left ventricle has no regional wall motion abnormalities. The left ventricular internal cavity size was normal in size. There is  no left ventricular hypertrophy. Left ventricular diastolic parameters are consistent with Grade I diastolic dysfunction (impaired relaxation). Right Ventricle: The right  ventricular size is normal. No increase in right ventricular wall thickness. Right ventricular systolic function is normal. Left Atrium: Left atrial size was normal in size. Right Atrium: Right atrial size was normal in size. Pericardium: There is no evidence of pericardial effusion. Mitral Valve: The mitral valve is normal  in structure. Mild mitral valve regurgitation. MV peak gradient, 4.3 mmHg. The mean mitral valve gradient is 2.0 mmHg. Tricuspid Valve: The tricuspid valve is normal in structure. Tricuspid valve regurgitation is mild. Aortic Valve: The aortic valve is normal in structure. Aortic valve regurgitation is mild. Aortic valve mean gradient measures 4.0 mmHg. Aortic valve peak gradient measures 7.0 mmHg. Aortic valve area, by VTI measures 3.72 cm. Pulmonic Valve: The pulmonic valve was normal in structure. Pulmonic valve regurgitation is not visualized. Aorta: The aortic root and ascending aorta are structurally normal, with no evidence of dilitation. IAS/Shunts: No atrial level shunt detected by color flow Doppler.  LEFT VENTRICLE PLAX 2D LVIDd:         4.80 cm   Diastology LVIDs:         3.20 cm   LV e' medial:    4.68 cm/s LV PW:         0.90 cm   LV E/e' medial:  14.5 LV IVS:        0.70 cm   LV e' lateral:   4.68 cm/s LVOT diam:     2.20 cm   LV E/e' lateral: 14.5 LV SV:         68 LV SV Index:   41 LVOT Area:     3.80 cm  LEFT ATRIUM             Index LA diam:        3.10 cm 1.87 cm/m LA Vol (A2C):   31.1 ml 18.75 ml/m LA Vol (A4C):   34.6 ml 20.86 ml/m LA Biplane Vol: 34.2 ml 20.62 ml/m  AORTIC VALVE                    PULMONIC VALVE AV Area (Vmax):    3.37 cm     PV Vmax:       1.13 m/s AV Area (Vmean):   3.31 cm     PV Vmean:      72.900 cm/s AV Area (VTI):     3.72 cm     PV VTI:        0.180 m AV Vmax:           132.00 cm/s  PV Peak grad:  5.1 mmHg AV Vmean:          90.300 cm/s  PV Mean grad:  3.0 mmHg AV VTI:            0.183 m AV Peak Grad:      7.0 mmHg AV Mean Grad:      4.0  mmHg LVOT Vmax:         117.00 cm/s LVOT Vmean:        78.700 cm/s LVOT VTI:          0.179 m LVOT/AV VTI ratio: 0.98  AORTA Ao Root diam: 3.50 cm MITRAL VALVE MV Area (PHT): 8.43 cm    SHUNTS MV Area VTI:   3.82 cm    Systemic VTI:  0.18 m MV Peak grad:  4.3 mmHg    Systemic Diam: 2.20 cm MV Mean grad:  2.0 mmHg MV Vmax:       1.04 m/s MV Vmean:      67.3 cm/s MV Decel Time: 90 msec MV E velocity: 67.70 cm/s MV A velocity: 96.40 cm/s MV E/A ratio:  0.70 Serafina Royals MD Electronically signed by Serafina Royals MD Signature Date/Time: 05/08/2021/1:34:35 PM    Final  PERFORMANCE STATUS (ECOG) : 4 - Bedbound  Review of Systems Unless otherwise noted, a complete review of systems is negative.  Physical Exam General: Frail appearing Pulmonary: Slightly exertionally labored, on O2 6 L Extremities: no edema, no joint deformities Skin: no rashes Neurological: Weakness but otherwise nonfocal  IMPRESSION: I met with patient and his friend, Margreta Journey.  Patient is well-known to me from the clinic.  Patient remains somewhat exertionally short of breath but O2 requirements are down to 6 L from 10 L.  CT of the chest with suggestive of bilateral infectious process.  However, of note, there has been interval decrease in size of right lower lobe lung mass and stable pulmonary nodules consistent with treatment effect.  Given the patient is on pembrolizumab, there is some concern for pneumonitis.  Patient is on steroids.  Patient says that his goals are aligned with current scope of treatment.  It is his hope that he will improve and be able to return to his previous baseline.  However, he does admit that he had become quite frail and essentially chair/bedbound.  He would like to take it day by day and hope for improvement.  Discussed completing ACP documents.  Patient would like to name his friend, Margreta Journey, to be his healthcare power of attorney.  We will ask the chaplain to assist with this.  Patient is  a DNR/DNI.   PLAN: -Continue current scope of treatment -Chaplain to assist with completing ACP documents -DNR/DNI  Case and plan discussed with Dr. Rogue Bussing   Time Total: 60 minutes  Visit consisted of counseling and education dealing with the complex and emotionally intense issues of symptom management and palliative care in the setting of serious and potentially life-threatening illness.Greater than 50%  of this time was spent counseling and coordinating care related to the above assessment and plan.  Signed by: [name], PhD, NP-C

## 2021-05-14 NOTE — Progress Notes (Signed)
PROGRESS NOTE  Jimmy West SJG:[id number] DOB: October 14, 1950   PCP: Pcp, No  Patient is from: Home.  Uses rolling walker at baseline.  DOA: 05/07/2021 LOS: 7  Chief complaints:  Chief Complaint  Patient presents with   Shortness of Breath     Brief Narrative / Interim history: 70 year old M with PMH of stage IV RLL lung cancer, cancer pain, CAD/stent in 2005, chronic respiratory failure with hypoxia on 2 L, anemia of chronic disease, anxiety, depression and debility presenting with generalized weakness, shortness of breath and dry cough, and admitted for acute on chronic hypoxic respiratory failure in the setting of LLL pneumonia and non-STEMI.  Started on broad-spectrum antibiotics, steroid and IV heparin.  Cardiology consulted recommended medical management and [place] outpatient.  CTA chest negative for PE.  Continues to require up to 6 L by [place] at rest. 10/25 patient became hypoxic and required directed oxygen, started high flow nasal cannula, FiO2 54%, inpatient setting around 90s  Subjective: No significant overnight events, patient still has significant shortness of breath, patient was complaining of chronic back pain no any other active issues.    Objective: Vitals:   05/14/21 1202 05/14/21 1401 05/14/21 1538 05/14/21 1612  BP: 115/74   113/80  Pulse: 95 (!) 107  98  Resp: 19 (!) 25  17  Temp: 97.9 F (36.6 C)   97.7 F (36.5 C)  TempSrc: Oral     SpO2: 91% (!) 88% 93% 96%  Weight:      Height:        Intake/Output Summary (Last 24 hours) at 05/14/2021 1710 Last data filed at 05/14/2021 1704 Gross per 24 hour  Intake 971.22 ml  Output 3135 ml  Net -2163.78 ml   Filed Weights   05/07/21 1439 05/10/21 0901 05/10/21 1537  Weight: 58.4 kg 63.2 kg 64 kg    Examination:  GENERAL: Frail looking elderly male.  No apparent distress. HEENT: MMM.  RESP: 90% on 6 L.  Hardly speaks in full sentence.  Crackles over LLL.  Diminished aeration.  Few scatterec  rhonchi CVS:  RRR. Heart sounds normal.  ABD/GI/GU: BS+. Abd soft, NTND.  MSK/EXT:  Moves extremities. No apparent deformity. No edema.  SKIN: no apparent skin lesion or wound. Right subclavian portacath NEURO: Awake and alert. Oriented appropriately.  No apparent focal neuro deficit. PSYCH: Calm. Normal affect.   Procedures:  None  Microbiology summarized: [id number] and influenza PCR nonreactive. MRSA PCR screen negative. Blood cultures NGTD.  Assessment & Plan: Acute on chronic respiratory failure with hypoxia due to LLL pneumonia with superimposed stage IV RLL lung cancer-on 2 L at baseline.  Currently requiring 6 L.  CTA chest negative for PE but concern for LLL pneumonia with RLL scarring from known lung cancer.  Pro-Cal elevated to 1.38.  Blood cultures negative. On cefepime since 10/18 making today day 7. S/p vancomycin - stop cefepime after today's dose, will have completed 7 days. Will also stop azithromycin after today's dose, will have completed 3 days of 500 - check CXR to eval for parapneumonic effusion which would require extended course abx - stop solumedrol, start oral prednisone, plan to wean -Wean oxygen as able. -IS/OOB/PT/OT - palliative consulted - Dr. B of oncology aware of the patient 10/25 oxygen requirement increased, patient is on HFNC 54% FiO2, patient is DNR/DNI, we will continue to monitor and start BiPAP if needed. ABG consistent with hypoxic respiratory failure CXR: Persistent interstitial and airspace opacities at the lung bases with slight  interval improvement. Remains concerning for multifocal pneumonia or pneumonitis. Known pulmonary mass not well evaluated Started Lasix 20 mg IV twice daily, fluid restriction 1.5 L/day  Severe sepsis due to pneumonia:tachycardia, tachypnea, leukocytosis, AKI with pneumonia POA. Sepsis physiology resolved -Managementas above.   NSTEMI/history of CAD/stent in 2005: Troponin 3153 -> 3506 -> 2927 -> 2894.  EKG with T  wave changes in lateral leads but no dynamic changes.  TTE with LVEF of 55 to 60%, G1-DD and no RWMA chest pain-free.  -Cardiology following -Completed 48 hours of IV heparin and transition back to home Eliquis. -Medical management due to poor candidacy for heart cath in the setting of respiratory failure -Cardiology recommends outpatient follow-up when stable from respiratory standpoint.  Chronic diastolic CHF: TTE as above.  Appears euvolemic on exam.  Does not seem to be on diuretics at home.  About 1.6 L UOP/24 hours. -Monitor fluid status   AKI/azotemia on CKD-3A: Improved.  Does not seem to be on nephrotoxic meds.  Could be hemodynamically mediated Recent Labs    04/27/21 0452 04/28/21 0541 05/07/21 1446 05/08/21 0552 05/09/21 0701 05/10/21 0517 05/11/21 0555 05/12/21 0530 05/13/21 0515 05/14/21 0706  BUN 32* 31* 27* 22 30* 36* 42* 48* 41* 33*  CREATININE 1.17 1.30* 1.71* 1.50* 1.44* 1.74* 1.39* 1.32* 1.22 1.13  -Continue monitoring -Avoid nephrotoxic meds.  Hypotension: Resolved.   Stage IV lung cancer s/p radiation and chemotherapy previously.   -Outpatient follow-up - oncology notified of patient's presence   Anemia of chronic disease: H&H stable after 1 unit Recent Labs    04/25/21 0513 04/26/21 0610 04/27/21 0452 04/28/21 0541 05/07/21 1446 05/08/21 0552 05/09/21 0701 05/10/21 0517 05/11/21 0555 05/12/21 0530  HGB 9.1* 8.8* 8.3* 9.7* 9.9* 9.4* 8.1* 7.6* 9.3* 9.8*  -Continue monitoring   Chronic pain syndrome: -Continue oxycodone and bowel regimen as needed.   Depression and Anxiety: Stable -Continue with Fluoxetine 40 g p.o. daily as well as Olanzapine 10 mg p.o. nightly   Hx of PE: CTA chest negative for PE. -Continue home Eliquis  Physical deconditioning-uses rolling walker at baseline. -Therapy recommends SNF  Leukocytosis/bandemia-likely from steroid.  Goal of care counseling-patient with comorbidity as above.  Very frail and  deconditioned.  Poor long-term prognosis.  Appropriately DNR/DNI.  A candidate for hospice -Palliative medicine consulted.  Body mass index is 22.77 kg/m.         DVT prophylaxis:   apixaban (ELIQUIS) tablet 5 mg  Code Status: DNR/DNI Family Communication: Patient and/or RN. Available if any question.  Level of care: Progressive Cardiac Status is: Inpatient  Remains inpatient appropriate because: Due to significant oxygen requirement, work of breathing and need for IV medications and close monitoring       Consultants:  Cardiology Palliative medicine   Sch Meds:  Scheduled Meds:  apixaban  5 mg Oral BID   atorvastatin  40 mg Oral Daily   budesonide (PULMICORT) nebulizer solution  0.5 mg Nebulization BID   Chlorhexidine Gluconate Cloth  6 each Topical Daily   feeding supplement  237 mL Oral TID BM   FLUoxetine  40 mg Oral Daily   guaiFENesin  1,200 mg Oral BID   ipratropium-albuterol  3 mL Nebulization TID   OLANZapine  10 mg Oral QHS   predniSONE  40 mg Oral Q breakfast   Continuous Infusions:  sodium chloride 10 mL/hr (05/13/21 1620)   PRN Meds:.sodium chloride, acetaminophen, ALPRAZolam, docusate sodium, guaiFENesin-dextromethorphan, ondansetron (ZOFRAN) IV, ondansetron, oxyCODONE, polyethylene glycol, sodium chloride  Antimicrobials:  Anti-infectives (From admission, onward)    Start     Dose/Rate Route Frequency Ordered Stop   05/13/21 1030  ceFEPIme (MAXIPIME) 2 g in sodium chloride 0.9 % 100 mL IVPB        2 g 200 mL/hr over 30 Minutes Intravenous Every 12 hours 05/13/21 0952 05/13/21 1959   05/11/21 1630  azithromycin (ZITHROMAX) 500 mg in sodium chloride 0.9 % 250 mL IVPB        500 mg 250 mL/hr over 60 Minutes Intravenous Every 24 hours 05/11/21 1537 05/13/21 1721   05/08/21 1800  vancomycin (VANCOREADY) IVPB 750 mg/150 mL  Status:  Discontinued        750 mg 150 mL/hr over 60 Minutes Intravenous Every 24 hours 05/08/21 1135 05/09/21 1448   05/08/21  0800  ceFEPIme (MAXIPIME) 2 g in sodium chloride 0.9 % 100 mL IVPB  Status:  Discontinued        2 g 200 mL/hr over 30 Minutes Intravenous Every 12 hours 05/07/21 1929 05/13/21 0952   05/07/21 2200  ceFEPIme (MAXIPIME) 2 g in sodium chloride 0.9 % 100 mL IVPB  Status:  Discontinued        2 g 200 mL/hr over 30 Minutes Intravenous Every 12 hours 05/07/21 1927 05/07/21 1929   05/07/21 1930  vancomycin (VANCOREADY) IVPB 500 mg/100 mL        500 mg 100 mL/hr over 60 Minutes Intravenous  Once 05/07/21 1927 05/07/21 2144   05/07/21 1927  vancomycin variable dose per unstable renal function (pharmacist dosing)  Status:  Discontinued         Does not apply See admin instructions 05/07/21 1927 05/09/21 1448   05/07/21 1600  vancomycin (VANCOCIN) IVPB 1000 mg/200 mL premix        1,000 mg 200 mL/hr over 60 Minutes Intravenous  Once 05/07/21 1552 05/07/21 1801   05/07/21 1600  ceFEPIme (MAXIPIME) 2 g in sodium chloride 0.9 % 100 mL IVPB        2 g 200 mL/hr over 30 Minutes Intravenous  Once 05/07/21 1552 05/07/21 1700        I have personally reviewed the following labs and images: CBC: Recent Labs  Lab 05/08/21 0552 05/09/21 0701 05/10/21 0517 05/11/21 0555 05/12/21 0530  WBC 14.1* 9.5 14.0* 14.5* 15.3*  NEUTROABS  --   --   --  14.0*  --   HGB 9.4* 8.1* 7.6* 9.3* 9.8*  HCT 28.5* 25.4* 24.1* 28.6* 30.0*  MCV 94.4 94.4 95.6 95.0 96.5  PLT 263 238 228 227 222   BMP &GFR Recent Labs  Lab 05/10/21 0517 05/11/21 0555 05/12/21 0530 05/13/21 0515 05/14/21 0706  NA 140 142 142 141 143  K 4.5 4.3 4.6 4.7 4.0  CL 103 105 105 103 104  CO2 26 28 29 29  32  GLUCOSE 156* 131* 146* 120* 96  BUN 36* 42* 48* 41* 33*  CREATININE 1.74* 1.39* 1.32* 1.22 1.13  CALCIUM 8.9 8.9 9.0 9.1 9.0  MG 2.0 2.0 1.9 1.6* 2.1  PHOS 5.0* 4.8* 2.6 2.4*  --    Estimated Creatinine Clearance: 54.9 mL/min (by C-G formula based on SCr of 1.13 mg/dL). Liver & Pancreas: Recent Labs  Lab 05/11/21 0555  05/12/21 0530 05/13/21 0515  AST 20  --   --   ALT 19  --   --   ALKPHOS 74  --   --   BILITOT 0.8  --   --   PROT 5.5*  --   --  ALBUMIN 2.3* 2.4* 2.5*   No results for input(s): LIPASE, AMYLASE in the last 168 hours. No results for input(s): AMMONIA in the last 168 hours. Diabetic: No results for input(s): HGBA1C in the last 72 hours. Recent Labs  Lab 05/12/21 1153 05/12/21 1621 05/12/21 2022  GLUCAP 170* 177* 132*   Cardiac Enzymes: No results for input(s): CKTOTAL, CKMB, CKMBINDEX, TROPONINI in the last 168 hours. No results for input(s): PROBNP in the last 8760 hours. Coagulation Profile: No results for input(s): INR, PROTIME in the last 168 hours.  Thyroid Function Tests: No results for input(s): TSH, T4TOTAL, FREET4, T3FREE, THYROIDAB in the last 72 hours. Lipid Profile: No results for input(s): CHOL, HDL, LDLCALC, TRIG, CHOLHDL, LDLDIRECT in the last 72 hours.  Anemia Panel: Recent Labs    05/12/21 0530  VITAMINB12 872  FOLATE 8.1  FERRITIN 1,120*  TIBC 276  IRON 59  RETICCTPCT 2.7   Urine analysis:    Component Value Date/Time   COLORURINE YELLOW (A) 05/07/2021 2044   APPEARANCEUR CLEAR (A) 05/07/2021 2044   LABSPEC 1.021 05/07/2021 2044   PHURINE 6.0 05/07/2021 2044   GLUCOSEU NEGATIVE 05/07/2021 2044   HGBUR NEGATIVE 05/07/2021 2044   BILIRUBINUR NEGATIVE 05/07/2021 2044   [place] 05/07/2021 2044   PROTEINUR NEGATIVE 05/07/2021 2044   NITRITE NEGATIVE 05/07/2021 2044   LEUKOCYTESUR NEGATIVE 05/07/2021 2044   Sepsis Labs: Invalid input(s): PROCALCITONIN, [place]  Microbiology: Recent Results (from the past 240 hour(s))  Culture, blood (Routine x 2)     Status: None   Collection Time: 05/07/21  2:46 PM   Specimen: BLOOD  Result Value Ref Range Status   Specimen Description BLOOD PORTA CATH  Final   Special Requests   Final    BOTTLES DRAWN AEROBIC AND ANAEROBIC Blood Culture results may not be optimal due to an excessive  volume of blood received in culture bottles   Culture   Final    NO GROWTH 5 DAYS Performed at [facility], [address] [place], [place] [address]    Report Status 05/12/2021 FINAL  Final  Culture, blood (Routine x 2)     Status: None   Collection Time: 05/07/21  2:46 PM   Specimen: BLOOD  Result Value Ref Range Status   Specimen Description BLOOD RIGHT ANTECUBITAL  Final   Special Requests   Final    BOTTLES DRAWN AEROBIC AND ANAEROBIC Blood Culture results may not be optimal due to an excessive volume of blood received in culture bottles   Culture   Final    NO GROWTH 5 DAYS Performed at [facility], [address] [place], [place] [address]    Report Status 05/12/2021 FINAL  Final  Resp Panel by RT-PCR (Flu A&B, Covid) Nasopharyngeal Swab     Status: None   Collection Time: 05/07/21  5:57 PM   Specimen: Nasopharyngeal Swab; Nasopharyngeal(NP) swabs in vial transport medium  Result Value Ref Range Status   SARS Coronavirus 2 by RT PCR NEGATIVE NEGATIVE Final    Comment: (NOTE) SARS-CoV-2 target nucleic acids are NOT DETECTED.  The SARS-CoV-2 RNA is generally detectable in upper respiratory specimens during the acute phase of infection. The lowest concentration of SARS-CoV-2 viral copies this assay can detect is 138 copies/mL. A negative result does not preclude SARS-Cov-2 infection and should not be used as the sole basis for treatment or other patient management decisions. A negative result may occur with  improper specimen collection/handling, submission of specimen other than nasopharyngeal swab,  presence of viral mutation(s) within the areas targeted by this assay, and inadequate number of viral copies(<138 copies/mL). A negative result must be combined with clinical observations, patient history, and epidemiological information. The expected result is Negative.  Fact Sheet for Patients:  EntrepreneurPulse.com.au  Fact Sheet  for Healthcare Providers:  IncredibleEmployment.be  This test is no t yet approved or cleared by the [place] FDA and  has been authorized for detection and/or diagnosis of SARS-CoV-2 by FDA under an Emergency Use Authorization (EUA). This EUA will remain  in effect (meaning this test can be used) for the duration of the COVID-19 declaration under Section 564(b)(1) of the Act, 21 U.S.C.section 360bbb-3(b)(1), unless the authorization is terminated  or revoked sooner.       Influenza A by PCR NEGATIVE NEGATIVE Final   Influenza B by PCR NEGATIVE NEGATIVE Final    Comment: (NOTE) The Xpert Xpress SARS-CoV-2/FLU/RSV plus assay is intended as an aid in the diagnosis of influenza from Nasopharyngeal swab specimens and should not be used as a sole basis for treatment. Nasal washings and aspirates are unacceptable for Xpert Xpress SARS-CoV-2/FLU/RSV testing.  Fact Sheet for Patients: EntrepreneurPulse.com.au  Fact Sheet for Healthcare Providers: IncredibleEmployment.be  This test is not yet approved or cleared by the [place] FDA and has been authorized for detection and/or diagnosis of SARS-CoV-2 by FDA under an Emergency Use Authorization (EUA). This EUA will remain in effect (meaning this test can be used) for the duration of the COVID-19 declaration under Section 564(b)(1) of the Act, 21 U.S.C. section 360bbb-3(b)(1), unless the authorization is terminated or revoked.  Performed at [facility], [address] [place], [place] [address]   MRSA Next Gen by PCR, Nasal     Status: None   Collection Time: 05/09/21  1:16 PM   Specimen: Nasal Mucosa; Nasal Swab  Result Value Ref Range Status   MRSA by PCR Next Gen NOT DETECTED NOT DETECTED Final    Comment: (NOTE) The GeneXpert MRSA Assay (FDA approved for NASAL specimens only), is one component of a comprehensive MRSA colonization surveillance program. It  is not intended to diagnose MRSA infection nor to guide or monitor treatment for MRSA infections. Test performance is not FDA approved in patients less than 8 years old. Performed at [facility], [address] [place], [place] [address]     Radiology Studies: DG Chest [place] 1 View  Result Date: 05/14/2021 CLINICAL DATA:  Two a 70 year old male presents with pneumonia. EXAM: PORTABLE CHEST 1 VIEW COMPARISON:  May 13, 2021 and multiple prior studies. FINDINGS: RIGHT-sided Port-A-Cath again terminates in the RIGHT atrium. EKG leads project over the chest. Cardiomediastinal contours and hilar structures are stable. Persistent interstitial and airspace opacities at the lung bases with slight interval improvement. No new areas of airspace disease. On limited assessment there is no acute skeletal process. IMPRESSION: Persistent interstitial and airspace opacities at the lung bases with slight interval improvement. Remains concerning for multifocal pneumonia or pneumonitis. Known pulmonary mass not well evaluated. Electronically Signed   By: Zetta Bills M.D.   On: 05/14/2021 15:44       Val Riles, MD [facility]  If 7PM-7AM, please contact night-coverage [email or web address] 05/14/2021, 5:10 PM

## 2021-05-14 NOTE — Assessment & Plan Note (Addendum)
#  70 year old male patient history of COPD/chronic respiratory failure-metastatic lung cancer currently on immunotherapy is currently admitted hospital for worsening respiratory distress/diagnosed with pneumonia.  #Right lower lobe lung cancer-adenocarcinoma currently on immunotherapy/Keytruda.  Discussed with the patient that given patient's pneumonitis is likely from [place] will discontinue in immunotherapy further.  See discussion below  #Acute on chronic respiratory failure / bilateral lower lobe pneumonitis-likely secondary to immunotherapy.  S/p infliximab x1 dose-notes improvement in the respiratory status.  Continue steroid/taper-based on respiratory status over the next 4 to 6 weeks.  Appreciate evaluation with Dr. Patsey Berthold.  Prognosis/disposition: Patient's prognosis unfortunately is poor.  Patient is not a candidate for any further immunotherapy.  He has extremely poor tolerance to chemotherapy.   I think it is reasonable to consider home with hospice at discharge.  Patient unfortunately has very poor social support.  I discussed with the patient he is in agreement.  Also discussed with [employer].

## 2021-05-14 NOTE — Progress Notes (Signed)
[facility] CONSULT NOTE  Patient Care Team: Pcp, No as PCP - General Kate Sable, MD as PCP - Cardiology (Cardiology) Telford Nab, RN as Registered Nurse Burlene Arnt, Gerda Diss, MD as Attending Physician (Emergency Medicine) Lebron Conners, [place] (Physician Assistant) Wonda Horner, MD as Referring Physician (Neurosurgery) Jason Coop, NP as Nurse Practitioner Cammie Sickle, MD as Consulting Physician (Hematology and Oncology)  CHIEF COMPLAINTS/PURPOSE OF CONSULTATION: lung cancer  HISTORY OF PRESENTING ILLNESS:  Jimmy West 70 y.o.  male male patient with stage IV lung cancer and also chronic respiratory failure 2 L home O2 is currently admitted to hospital for worsening shortness of breath and cough noted to be hypoxic on 80% room air.  Patient was also hypotensive in the 90s.  Patient needed fluid boluses and also a liters of oxygen on admission.  CTA showed-no pulmonary embolus; showed decreasing size of the right lower lobe lung mass.  Did show bilateral diffuse lower lobe infiltrates consistent with pneumonia.  Patient has been treated with steroids-initially IV Solu-Medrol currently on prednisone 60 mg a day.  Patient's respiratory status seems to be improving.  Chest x-ray however not improving.  Overall patient seems to improved since admission.  However he is not back to baseline.  Review of Systems  Constitutional:  Positive for malaise/fatigue and weight loss. Negative for chills, diaphoresis and fever.  HENT:  Negative for nosebleeds and sore throat.   Eyes:  Negative for double vision.  Respiratory:  Positive for cough, sputum production and shortness of breath. Negative for hemoptysis and wheezing.   Cardiovascular:  Negative for chest pain, palpitations, orthopnea and leg swelling.  Gastrointestinal:  Positive for nausea. Negative for abdominal pain, blood in stool, constipation, diarrhea, heartburn, melena and  vomiting.  Genitourinary:  Negative for dysuria, frequency and urgency.  Musculoskeletal:  Negative for back pain and joint pain.  Skin: Negative.  Negative for itching and rash.  Neurological:  Positive for weakness. Negative for dizziness, tingling, focal weakness and headaches.  Endo/Heme/Allergies:  Does not bruise/bleed easily.  Psychiatric/Behavioral:  Negative for depression. The patient is nervous/anxious and has insomnia.     MEDICAL HISTORY:  Past Medical History:  Diagnosis Date   Bulging lumbar disc    Cancer ([place])    stage 4 lung cancer   Heart attack ([place])     SURGICAL HISTORY: Past Surgical History:  Procedure Laterality Date   CARDIAC CATHETERIZATION     two stents   KNEE SURGERY Left    PORTA CATH INSERTION N/A 05/21/2018   Procedure: PORTA CATH INSERTION;  Surgeon: Algernon Huxley, MD;  Location: [facility];  Service: Cardiovascular;  Laterality: N/A;    SOCIAL HISTORY: Social History   Socioeconomic History   Marital status: Single    Spouse name: Not on file   Number of children: Not on file   Years of education: Not on file   Highest education level: Not on file  Occupational History   Not on file  Tobacco Use   Smoking status: Former    Packs/day: 1.50    Years: 15.00    Pack years: 22.50    Types: Cigarettes    Quit date: 11/03/2003    Years since quitting: 17.5   Smokeless tobacco: Never  Vaping Use   Vaping Use: Never used  Substance and Sexual Activity   Alcohol use: Not Currently   Drug use: No   Sexual activity: Not Currently  Other Topics Concern  Not on file  Social History Narrative   Not on file   Social Determinants of Health   Financial Resource Strain: Not on file  Food Insecurity: Not on file  Transportation Needs: Not on file  Physical Activity: Not on file  Stress: Not on file  Social Connections: Not on file  Intimate Partner Violence: Not on file    FAMILY HISTORY: Family History  Problem Relation Age  of Onset   Heart failure Father    Cancer Maternal Aunt    Heart failure Maternal Uncle    Dementia Paternal Grandmother    Cancer Maternal Aunt     ALLERGIES:  has No Known Allergies.  MEDICATIONS:  Current Facility-Administered Medications  Medication Dose Route Frequency Provider Last Rate Last Admin   0.9 %  sodium chloride infusion   Intravenous PRN Gwynne Edinger, MD 10 mL/hr at 05/13/21 1620 10 mL/hr at 05/13/21 1620   acetaminophen (TYLENOL) tablet 1,000 mg  1,000 mg Oral TID PRN Enzo Bi, MD       ALPRAZolam Duanne Moron) tablet 1 mg  1 mg Oral TID PRN Enzo Bi, MD   1 mg at 05/14/21 2128   apixaban (ELIQUIS) tablet 5 mg  5 mg Oral BID Theora Gianotti, NP   5 mg at 05/14/21 2128   atorvastatin (LIPITOR) tablet 40 mg  40 mg Oral Daily Theora Gianotti, NP   40 mg at 05/14/21 0847   budesonide (PULMICORT) nebulizer solution 0.5 mg  0.5 mg Nebulization BID Flora Lipps, MD   0.5 mg at 05/14/21 2147   Chlorhexidine Gluconate Cloth 2 % PADS 6 each  6 each Topical Daily Enzo Bi, MD   6 each at 05/14/21 0849   docusate sodium (COLACE) capsule 100 mg  100 mg Oral BID PRN Enzo Bi, MD       feeding supplement (ENSURE ENLIVE / ENSURE PLUS) liquid 237 mL  237 mL Oral TID BM Enzo Bi, MD   237 mL at 05/13/21 0936   FLUoxetine (PROZAC) capsule 40 mg  40 mg Oral Daily Enzo Bi, MD   40 mg at 05/14/21 0847   furosemide (LASIX) injection 20 mg  20 mg Intravenous Q12H Val Riles, MD   20 mg at 05/14/21 1844   guaiFENesin (MUCINEX) 12 hr tablet 1,200 mg  1,200 mg Oral BID Raiford Noble Latif, DO   1,200 mg at 05/14/21 2127   guaiFENesin-dextromethorphan (ROBITUSSIN DM) 100-10 MG/5ML syrup 10 mL  10 mL Oral Q6H PRN Enzo Bi, MD   10 mL at 05/11/21 0929   ipratropium-albuterol (DUONEB) 0.5-2.5 (3) MG/3ML nebulizer solution 3 mL  3 mL Nebulization TID Val Riles, MD   3 mL at 05/14/21 2147   OLANZapine (ZYPREXA) tablet 10 mg  10 mg Oral QHS Enzo Bi, MD   10 mg at  05/14/21 2128   ondansetron (ZOFRAN) injection 4 mg  4 mg Intravenous Q6H PRN Enzo Bi, MD       ondansetron (ZOFRAN-ODT) disintegrating tablet 4 mg  4 mg Oral Q8H PRN Enzo Bi, MD       oxyCODONE (Oxy IR/ROXICODONE) immediate release tablet 10-15 mg  10-15 mg Oral Q4H PRN Enzo Bi, MD   10 mg at 05/14/21 2127   polyethylene glycol (MIRALAX / GLYCOLAX) packet 17 g  17 g Oral BID PRN Enzo Bi, MD       predniSONE (DELTASONE) tablet 40 mg  40 mg Oral Q breakfast Wouk, Ailene Rud, MD   40 mg at  05/14/21 0847   sodium chloride (OCEAN) 0.65 % nasal spray 1 spray  1 spray Each Nare PRN Mercy Riding, MD       Facility-Administered Medications Ordered in Other Encounters  Medication Dose Route Frequency Provider Last Rate Last Admin   0.9 %  sodium chloride infusion   Intravenous Once Corcoran, Melissa C, MD       0.9 %  sodium chloride infusion   Intravenous Once PRN Corcoran, Melissa C, MD       0.9 %  sodium chloride infusion   Intravenous Continuous Cammie Sickle, MD   Stopped at 03/19/21 1151   0.9 %  sodium chloride infusion   Intravenous Once Charlaine Dalton R, MD       albuterol (PROVENTIL) (2.5 MG/3ML) 0.083% nebulizer solution 2.5 mg  2.5 mg Nebulization Once PRN Lequita Asal, MD       alteplase (CATHFLO ACTIVASE) injection 2 mg  2 mg Intracatheter Once PRN Lequita Asal, MD       cyanocobalamin ((VITAMIN B-12)) injection 1,000 mcg  1,000 mcg Intramuscular Once Lloyd Huger, MD       EPINEPHrine (ADRENALIN) 1 MG/10ML injection 0.25 mg  0.25 mg Intravenous Once PRN Lequita Asal, MD       EPINEPHrine (ADRENALIN) 1 MG/10ML injection 0.25 mg  0.25 mg Intravenous Once PRN Corcoran, Melissa C, MD       heparin lock flush 100 UNIT/ML injection            heparin lock flush 100 unit/mL  500 Units Intracatheter Once PRN Lequita Asal, MD       heparin lock flush 100 unit/mL  250 Units Intracatheter Once PRN Lequita Asal, MD       heparin lock  flush 100 unit/mL  500 Units Intracatheter Once PRN Lloyd Huger, MD       sodium chloride flush (NS) 0.9 % injection 10 mL  10 mL Intravenous PRN Nolon Stalls C, MD   10 mL at 03/04/19 0959   sodium chloride flush (NS) 0.9 % injection 10 mL  10 mL Intracatheter Once PRN Lequita Asal, MD       sodium chloride flush (NS) 0.9 % injection 3 mL  3 mL Intracatheter Once PRN Lequita Asal, MD        Patient on nasal cannula oxygen. PHYSICAL EXAMINATION:  Vitals:   05/14/21 2000 05/14/21 2148  BP: 110/72   Pulse: 93   Resp: 18   Temp: 98.3 F (36.8 C)   SpO2: 93% 93%   Filed Weights   05/07/21 1439 05/10/21 0901 05/10/21 1537  Weight: 128 lb 12 oz (58.4 kg) 139 lb 5.3 oz (63.2 kg) 141 lb 1.5 oz (64 kg)    Physical Exam Vitals and nursing note reviewed.  HENT:     Head: Normocephalic and atraumatic.     Mouth/Throat:     Pharynx: Oropharynx is clear.  Eyes:     Extraocular Movements: Extraocular movements intact.     Pupils: Pupils are equal, round, and reactive to light.  Cardiovascular:     Rate and Rhythm: Normal rate and regular rhythm.  Pulmonary:     Comments: Decreased breath sounds bilaterally.  Abdominal:     Palpations: Abdomen is soft.  Musculoskeletal:        General: Normal range of motion.     Cervical back: Normal range of motion.  Skin:    General: Skin is warm.  Neurological:  General: No focal deficit present.     Mental Status: He is alert and oriented to person, place, and time.  Psychiatric:        Behavior: Behavior normal.        Judgment: Judgment normal.     LABORATORY DATA:  I have reviewed the data as listed Lab Results  Component Value Date   WBC 15.3 (H) 05/12/2021   HGB 9.8 (L) 05/12/2021   HCT 30.0 (L) 05/12/2021   MCV 96.5 05/12/2021   PLT 222 05/12/2021   Recent Labs    04/24/21 1042 04/25/21 0513 05/07/21 1446 05/08/21 0552 05/11/21 0555 05/12/21 0530 05/13/21 0515 05/14/21 0706  NA 140   < >  139   < > 142 142 141 143  K 3.1*   < > 4.1   < > 4.3 4.6 4.7 4.0  CL 107   < > 101   < > 105 105 103 104  CO2 24   < > 27   < > 28 29 29  32  GLUCOSE 117*   < > 120*   < > 131* 146* 120* 96  BUN 23   < > 27*   < > 42* 48* 41* 33*  CREATININE 1.36*   < > 1.71*   < > 1.39* 1.32* 1.22 1.13  CALCIUM 8.2*   < > 8.7*   < > 8.9 9.0 9.1 9.0  GFRNONAA 56*   < > 43*   < > 55* 58* >60 >60  PROT 5.8*  --  6.5  --  5.5*  --   --   --   ALBUMIN 2.6*  --  2.6*  --  2.3* 2.4* 2.5*  --   AST 10*  --  22  --  20  --   --   --   ALT 9  --  15  --  19  --   --   --   ALKPHOS 63  --  80  --  74  --   --   --   BILITOT 1.1  --  0.9  --  0.8  --   --   --    < > = values in this interval not displayed.    RADIOGRAPHIC STUDIES: I have personally reviewed the radiological images as listed and agreed with the findings in the report. DG Chest 2 View  Result Date: 05/13/2021 CLINICAL DATA:  Shortness of breath.  History of lung cancer. EXAM: CHEST - 2 VIEW COMPARISON:  May 11, 2021. FINDINGS: Stable cardiomediastinal silhouette. Increased bilateral basilar airspace opacities are noted concerning for worsening pneumonia. Right internal jugular Port-A-Cath is unchanged in position. Old upper thoracic compression fracture is noted. IMPRESSION: Increased bibasilar airspace opacities are noted concerning for worsening pneumonia. Electronically Signed   By: Marijo Conception M.D.   On: 05/13/2021 14:12   DG Chest 2 View  Result Date: 04/24/2021 CLINICAL DATA:  Shortness of breath. History of stage IV lung cancer. EXAM: CHEST - 2 VIEW COMPARISON:  Chest radiographs 09/18/2020 and CT 11/01/2020 FINDINGS: A right jugular Port-A-Cath remains in place terminating over the right atrium. The cardiac silhouette is within normal limits in size. There is volume loss in the hemithorax with increasing confluent and patchy opacity in the right perihilar region and right lung base which partially obscures the known superior segment  right lower lobe mass. There may be a trace right pleural effusion. The interstitial markings are mildly prominent throughout the  left lung without focal consolidation. No pneumothorax is identified. No acute osseous abnormality is seen. IMPRESSION: Increasing right lower lung consolidation and volume loss. Electronically Signed   By: Logan Bores M.D.   On: 04/24/2021 11:36   CT Angio Chest PE W and/or Wo Contrast  Result Date: 05/07/2021 CLINICAL DATA:  Weakness and cough. History of metastatic lung cancer. EXAM: CT ANGIOGRAPHY CHEST WITH CONTRAST TECHNIQUE: Multidetector CT imaging of the chest was performed using the standard protocol during bolus administration of intravenous contrast. Multiplanar CT image reconstructions and MIPs were obtained to evaluate the vascular anatomy. CONTRAST:  76mL OMNIPAQUE IOHEXOL 350 MG/ML SOLN COMPARISON:  11/02/2019 FINDINGS: Cardiovascular: The heart is mildly enlarged but stable. No pericardial effusion. Stable tortuosity and calcification of the thoracic aorta but no aneurysm or dissection. The branch vessels are patent. Three-vessel coronary artery calcifications are noted. The right IJ power port is in unchanged position. No complicating features. The pulmonary arterial tree is well opacified. No filling defects to suggest pulmonary embolism. Mediastinum/Nodes: No mediastinal or hilar mass or lymphadenopathy the. The esophagus is grossly. Lungs/Pleura: Stable underlying advanced emphysematous changes and areas of pulmonary scarring. Right lower lobe lung mass measures approximately 2.8 x 2.2 cm and previously measured 3.3 x 3.2 cm. Some surrounding interstitial scarring changes likely related to radiation. Peripheral right upper lobe lesion on image number 29/6 is stable measuring a maximum of 13 mm. Diffuse interstitial and airspace process in both lungs is most likely an infectious process. Aspiration would be a consideration. There is also a small right pleural  effusion. Upper Abdomen: Few tiny low-attenuation hepatic lesions are stable. Stable splenomegaly. Stable right renal cyst. Musculoskeletal: No chest wall mass, supraclavicular or axillary adenopathy. The bony thorax is intact. No worrisome bone lesions. Stable severe compression deformity of T4 with vertebral plana appearance. Review of the MIP images confirms the above findings. IMPRESSION: 1. No CT findings for pulmonary embolism. 2. Stable tortuosity and calcification of the thoracic aorta but no dissection. 3. Interval decrease in size of the right lower lobe lung mass. The right upper lobe peripheral pulmonary nodule is stable. 4. Interstitial and airspace process in both lungs is likely an infectious process. Aspiration would be a consideration. Aortic Atherosclerosis (ICD10-I70.0) and Emphysema (ICD10-J43.9). Electronically Signed   By: Marijo Sanes M.D.   On: 05/07/2021 16:56   DG Chest Port 1 View  Result Date: 05/14/2021 CLINICAL DATA:  Two a 70 year old male presents with pneumonia. EXAM: PORTABLE CHEST 1 VIEW COMPARISON:  May 13, 2021 and multiple prior studies. FINDINGS: RIGHT-sided Port-A-Cath again terminates in the RIGHT atrium. EKG leads project over the chest. Cardiomediastinal contours and hilar structures are stable. Persistent interstitial and airspace opacities at the lung bases with slight interval improvement. No new areas of airspace disease. On limited assessment there is no acute skeletal process. IMPRESSION: Persistent interstitial and airspace opacities at the lung bases with slight interval improvement. Remains concerning for multifocal pneumonia or pneumonitis. Known pulmonary mass not well evaluated. Electronically Signed   By: Zetta Bills M.D.   On: 05/14/2021 15:44   DG Chest Port 1 View  Result Date: 05/11/2021 CLINICAL DATA:  Shortness of breath in a 70 year old male with history of pulmonary neoplasm. EXAM: PORTABLE CHEST 1 VIEW COMPARISON:  Most recent chest  x-ray from May 07, 2021 and CT of the chest from May 07, 2021. FINDINGS: Diffuse interstitial and airspace opacities in the mid and lower lungs as on the recent chest x-ray. RIGHT-sided Port-A-Cath terminates in  the RIGHT atrium. Cardiomediastinal contours are stable. No visible pneumothorax.  No gross effusion. On limited assessment there is no acute skeletal process. IMPRESSION: Diffuse interstitial and airspace opacities in the mid and lower lungs, similar to the recent chest x-ray. May be post infectious fibrotic changes or ongoing pneumonia. Would also correlate with any risk factors for pneumonitis that could be related to ongoing therapy. Electronically Signed   By: Zetta Bills M.D.   On: 05/11/2021 08:14   DG Chest Port 1 View  Result Date: 05/07/2021 CLINICAL DATA:  Sepsis.  History of right lower lobe lung cancer. EXAM: PORTABLE CHEST 1 VIEW COMPARISON:  Chest x-ray 04/24/2021.  PET-CT 01/03/2021 FINDINGS: The right IJ power port is in stable position. The cardiac silhouette, mediastinal and hilar contours are stable. Extensive opacity in the right lower lobe most likely radiation fibrosis. New left lower lobe airspace process most consistent with pneumonia. No definite pleural effusions or pneumothorax. IMPRESSION: New left lower lobe airspace process most consistent with pneumonia. Extensive right lower lobe density likely radiation change. Electronically Signed   By: Marijo Sanes M.D.   On: 05/07/2021 16:06   ECHOCARDIOGRAM COMPLETE  Result Date: 05/08/2021    ECHOCARDIOGRAM REPORT   Patient Name:   Jimmy West Date of Exam: 05/08/2021 Medical Rec #:  [id number]            Height:       66.0 in Accession #:    [id number]           Weight:       128.7 lb Date of Birth:  30-Nov-1950           BSA:          1.658 m Patient Age:    52 years             BP:           97/66 mmHg Patient Gender: M                    HR:           99 bpm. Exam Location:  [facility] Procedure: 2D Echo,  Color Doppler and Cardiac Doppler Indications:     Elevated troponin  History:         Patient has no prior history of Echocardiogram examinations.                  Stage 4 lung cancer.  Sonographer:     Charmayne Sheer Referring Phys:  [id number] [place] Diagnosing Phys: Serafina Royals MD  Sonographer Comments: No subcostal window and suboptimal apical window. IMPRESSIONS  1. Left ventricular ejection fraction, by estimation, is 55 to 60%. The left ventricle has normal function. The left ventricle has no regional wall motion abnormalities. Left ventricular diastolic parameters are consistent with Grade I diastolic dysfunction (impaired relaxation).  2. Right ventricular systolic function is normal. The right ventricular size is normal.  3. The mitral valve is normal in structure. Mild mitral valve regurgitation.  4. The aortic valve is normal in structure. Aortic valve regurgitation is mild. FINDINGS  Left Ventricle: Left ventricular ejection fraction, by estimation, is 55 to 60%. The left ventricle has normal function. The left ventricle has no regional wall motion abnormalities. The left ventricular internal cavity size was normal in size. There is  no left ventricular hypertrophy. Left ventricular diastolic parameters are consistent with Grade I diastolic dysfunction (impaired relaxation). Right Ventricle: The right ventricular size  is normal. No increase in right ventricular wall thickness. Right ventricular systolic function is normal. Left Atrium: Left atrial size was normal in size. Right Atrium: Right atrial size was normal in size. Pericardium: There is no evidence of pericardial effusion. Mitral Valve: The mitral valve is normal in structure. Mild mitral valve regurgitation. MV peak gradient, 4.3 mmHg. The mean mitral valve gradient is 2.0 mmHg. Tricuspid Valve: The tricuspid valve is normal in structure. Tricuspid valve regurgitation is mild. Aortic Valve: The aortic valve is normal in structure. Aortic valve  regurgitation is mild. Aortic valve mean gradient measures 4.0 mmHg. Aortic valve peak gradient measures 7.0 mmHg. Aortic valve area, by VTI measures 3.72 cm. Pulmonic Valve: The pulmonic valve was normal in structure. Pulmonic valve regurgitation is not visualized. Aorta: The aortic root and ascending aorta are structurally normal, with no evidence of dilitation. IAS/Shunts: No atrial level shunt detected by color flow Doppler.  LEFT VENTRICLE PLAX 2D LVIDd:         4.80 cm   Diastology LVIDs:         3.20 cm   LV e' medial:    4.68 cm/s LV PW:         0.90 cm   LV E/e' medial:  14.5 LV IVS:        0.70 cm   LV e' lateral:   4.68 cm/s LVOT diam:     2.20 cm   LV E/e' lateral: 14.5 LV SV:         68 LV SV Index:   41 LVOT Area:     3.80 cm  LEFT ATRIUM             Index LA diam:        3.10 cm 1.87 cm/m LA Vol (A2C):   31.1 ml 18.75 ml/m LA Vol (A4C):   34.6 ml 20.86 ml/m LA Biplane Vol: 34.2 ml 20.62 ml/m  AORTIC VALVE                    PULMONIC VALVE AV Area (Vmax):    3.37 cm     PV Vmax:       1.13 m/s AV Area (Vmean):   3.31 cm     PV Vmean:      72.900 cm/s AV Area (VTI):     3.72 cm     PV VTI:        0.180 m AV Vmax:           132.00 cm/s  PV Peak grad:  5.1 mmHg AV Vmean:          90.300 cm/s  PV Mean grad:  3.0 mmHg AV VTI:            0.183 m AV Peak Grad:      7.0 mmHg AV Mean Grad:      4.0 mmHg LVOT Vmax:         117.00 cm/s LVOT Vmean:        78.700 cm/s LVOT VTI:          0.179 m LVOT/AV VTI ratio: 0.98  AORTA Ao Root diam: 3.50 cm MITRAL VALVE MV Area (PHT): 8.43 cm    SHUNTS MV Area VTI:   3.82 cm    Systemic VTI:  0.18 m MV Peak grad:  4.3 mmHg    Systemic Diam: 2.20 cm MV Mean grad:  2.0 mmHg MV Vmax:       1.04 m/s MV Vmean:  67.3 cm/s MV Decel Time: 90 msec MV E velocity: 67.70 cm/s MV A velocity: 96.40 cm/s MV E/A ratio:  0.70 Serafina Royals MD Electronically signed by Serafina Royals MD Signature Date/Time: 05/08/2021/1:34:35 PM    Final     Primary cancer of right lower lobe  of lung [facility]) #70 year old male patient history of COPD/chronic respiratory failure-metastatic lung cancer currently on immunotherapy is currently admitted hospital for worsening respiratory distress/diagnosed with pneumonia.  #Right lower lobe lung cancer-adenocarcinoma currently on immunotherapy/Keytruda.  With response noted to radiation of the right lower lobe lung mass s/p SBRT.  No evidence of any progressive lung cancer at this time.  #Bilateral lower lung infiltrate-pneumonitis-infectious versus drug-induced/immunotherapy.  S/p antibiotics/steroids.  Currently on prednisone 60 mg a day.  Improvement noted in terms of oxygen requirements.  However chest x-ray not improving.  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  #Anxiety/depression  Recommendations:  #Agree with current medical therapy antibiotics/steroids.  Given the clinical suspicion of immunotherapy induced pneumonitis-patient will need prolonged steroids/tapering steroids. will discuss with pulmonary-regarding worsening chest x-ray.  Question need for IV steroids/infliximab.   #Agree with palliative care evaluation.  Patient is DNR/DNI.  Given the absence of progressive lung cancer-I think it is reasonable to continue current scope of care.  Patient will need a break from immunotherapy.  Immunotherapy could well be contraindicated moving forward if the pneumonitis is attributed to immunotherapy.  Discussed with [employer].  Thank you Dr.Wouk for allowing me to participate in the care of your pleasant patient. Please do not hesitate to contact me with questions or concerns in the interim.  # I reviewed the blood work- with the patient in detail; also reviewed the imaging independently [as summarized above]; and with the patient in detail.   All questions were answered. The patient knows to call the clinic with any problems, questions or concerns.   Cammie Sickle, MD 05/14/2021 10:26  PM

## 2021-05-14 NOTE — Progress Notes (Signed)
PT Cancellation Note  Patient Details Name: Jimmy West MRN: [id number] DOB: 11-13-1950   Cancelled Treatment:  Medical team arriving during beginning of PT session for discussion. PT to reassess as able.  [employer], SPT

## 2021-05-14 NOTE — Progress Notes (Addendum)
o2 sats 80s on 9L HFNC /Continuous pulse ox applied per order / MD and resp made aware and resp at bedside/ ABG and chest xray ordered/ Heated High Flow [place] applied 54%/40L/ 02 sats improved / pt resting comfortably/ no distress noted/ will monitor close

## 2021-05-15 ENCOUNTER — Inpatient Hospital Stay: Payer: Medicare HMO

## 2021-05-15 ENCOUNTER — Inpatient Hospital Stay: Payer: Medicare HMO | Admitting: Internal Medicine

## 2021-05-15 DIAGNOSIS — J9621 Acute and chronic respiratory failure with hypoxia: Secondary | ICD-10-CM | POA: Diagnosis not present

## 2021-05-15 DIAGNOSIS — J9601 Acute respiratory failure with hypoxia: Secondary | ICD-10-CM | POA: Diagnosis not present

## 2021-05-15 DIAGNOSIS — C3431 Malignant neoplasm of lower lobe, right bronchus or lung: Secondary | ICD-10-CM | POA: Diagnosis not present

## 2021-05-15 LAB — CBC
HCT: 31.6 % — ABNORMAL LOW (ref 39.0–52.0)
Hemoglobin: 10.3 g/dL — ABNORMAL LOW (ref 13.0–17.0)
MCH: 31.6 pg (ref 26.0–34.0)
MCHC: 32.6 g/dL (ref 30.0–36.0)
MCV: 96.9 fL (ref 80.0–100.0)
Platelets: 176 10*3/uL (ref 150–400)
RBC: 3.26 MIL/uL — ABNORMAL LOW (ref 4.22–5.81)
RDW: 19.9 % — ABNORMAL HIGH (ref 11.5–15.5)
WBC: 14.9 10*3/uL — ABNORMAL HIGH (ref 4.0–10.5)
nRBC: 0 % (ref 0.0–0.2)

## 2021-05-15 LAB — MAGNESIUM: Magnesium: 2.1 mg/dL (ref 1.7–2.4)

## 2021-05-15 LAB — BASIC METABOLIC PANEL
Anion gap: 4 — ABNORMAL LOW (ref 5–15)
BUN: 33 mg/dL — ABNORMAL HIGH (ref 8–23)
CO2: 34 mmol/L — ABNORMAL HIGH (ref 22–32)
Calcium: 8.5 mg/dL — ABNORMAL LOW (ref 8.9–10.3)
Chloride: 102 mmol/L (ref 98–111)
Creatinine, Ser: 1.15 mg/dL (ref 0.61–1.24)
GFR, Estimated: >60 mL/min (ref >60)
Glucose, Bld: 109 mg/dL — ABNORMAL HIGH (ref 70–99)
Potassium: 4.1 mmol/L (ref 3.5–5.1)
Sodium: 140 mmol/L (ref 135–145)

## 2021-05-15 LAB — PHOSPHORUS: Phosphorus: 3.6 mg/dL (ref 2.5–4.6)

## 2021-05-15 NOTE — Telephone Encounter (Addendum)
Reviewed the patient's chart. He is still currently admitted as of this morning.  Will follow up on 10/27 regarding discharge status.

## 2021-05-15 NOTE — Progress Notes (Signed)
PROGRESS NOTE  Jimmy West ZOX:[id number] DOB: 1951/04/29   PCP: Pcp, No  Patient is from: Home.  Uses rolling walker at baseline.  DOA: 05/07/2021 LOS: 8  Chief complaints:  Chief Complaint  Patient presents with   Shortness of Breath     Brief Narrative / Interim history: 70 year old M with PMH of stage IV RLL lung cancer, cancer pain, CAD/stent in 2005, chronic respiratory failure with hypoxia on 2 L, anemia of chronic disease, anxiety, depression and debility presenting with generalized weakness, shortness of breath and dry cough, and admitted for acute on chronic hypoxic respiratory failure in the setting of LLL pneumonia and non-STEMI.  Started on broad-spectrum antibiotics, steroid and IV heparin.  Cardiology consulted recommended medical management and [place] outpatient.  CTA chest negative for PE.  Continues to require up to 6 L by [place] at rest. 10/25 patient became hypoxic and required directed oxygen, started high flow nasal cannula, FiO2 54%, inpatient setting around 90s  Subjective: No significant overnight events, patient still has significant shortness of breath, feeling little better comfortable on high flow nasal cannula.  Denies any chest pain palpitations, no any other active issues.  Patient has chronic backache which is under control now.   Objective: Vitals:   05/15/21 0801 05/15/21 1155 05/15/21 1352 05/15/21 1525  BP:  102/69  100/67  Pulse: 85 (!) 107 95 (!) 102  Resp: 18 18 18 18   Temp:  98.3 F (36.8 C)  98.2 F (36.8 C)  TempSrc:  Oral  Oral  SpO2: 92% 90% 94% 92%  Weight:      Height:        Intake/Output Summary (Last 24 hours) at 05/15/2021 1702 Last data filed at 05/15/2021 1526 Gross per 24 hour  Intake 720 ml  Output 3725 ml  Net -3005 ml   Filed Weights   05/07/21 1439 05/10/21 0901 05/10/21 1537  Weight: 58.4 kg 63.2 kg 64 kg    Examination:  GENERAL: Frail looking elderly male.  No apparent distress. HEENT: MMM.   RESP: 90% on 6 L.  Hardly speaks in full sentence.  Crackles over LLL.  Diminished aeration.  Few scatterec rhonchi CVS:  RRR. Heart sounds normal.  ABD/GI/GU: BS+. Abd soft, NTND.  MSK/EXT:  Moves extremities. No apparent deformity. No edema.  SKIN: no apparent skin lesion or wound. Right subclavian portacath NEURO: Awake and alert. Oriented appropriately.  No apparent focal neuro deficit. PSYCH: Calm. Normal affect.   Procedures:  None  Microbiology summarized: [id number] and influenza PCR nonreactive. MRSA PCR screen negative. Blood cultures NGTD.  Assessment & Plan: Acute on chronic respiratory failure with hypoxia due to LLL pneumonia with superimposed stage IV RLL lung cancer-on 2 L at baseline.  Currently requiring 6 L.  CTA chest negative for PE but concern for LLL pneumonia with RLL scarring from known lung cancer.  Pro-Cal elevated to 1.38.  Blood cultures negative. On cefepime since 10/18 making today day 7. S/p vancomycin - s/p Cefepime completed 7 days. S/p azithromycin 500 x 3 doses  - check CXR to eval for parapneumonic effusion which would require extended course abx - stop solumedrol, start oral prednisone, plan to wean -Wean oxygen as able. -IS/OOB/PT/OT - palliative consulted - Dr. B of oncology aware of the patient 10/25 oxygen requirement increased, patient is on HFNC 54% FiO2, patient is DNR/DNI, we will continue to monitor and start BiPAP if needed. ABG consistent with hypoxic respiratory failure CXR: Persistent interstitial and airspace opacities at the  lung bases with slight interval improvement. Remains concerning for multifocal pneumonia or pneumonitis. Known pulmonary mass not well evaluated Started Lasix 20 mg IV twice daily, fluid restriction 1.5 L/day 10/26 discussed with the pulmonologist,  CT chest: stable RLL mass and bilateral interstitial and peribronchialar consolidative opacities,  extensive emphysematous changes, multifocal infectious process vs  inflammatory  Pneumonitis could be due to immunotherapy administration, and less likely lymphangitic carcinomatosis.   Severe sepsis due to pneumonia:tachycardia, tachypnea, leukocytosis, AKI with pneumonia POA. Sepsis physiology resolved -Managementas above.   NSTEMI/history of CAD/stent in 2005: Troponin 3153 -> 3506 -> 2927 -> 2894.  EKG with T wave changes in lateral leads but no dynamic changes.  TTE with LVEF of 55 to 60%, G1-DD and no RWMA chest pain-free.  -Cardiology following -Completed 48 hours of IV heparin and transition back to home Eliquis. -Medical management due to poor candidacy for heart cath in the setting of respiratory failure -Cardiology recommends outpatient follow-up when stable from respiratory standpoint.  Chronic diastolic CHF: TTE as above.  Appears euvolemic on exam.  Does not seem to be on diuretics at home.  About 1.6 L UOP/24 hours. -Monitor fluid status   AKI/azotemia on CKD-3A: Improved.  Does not seem to be on nephrotoxic meds.  Could be hemodynamically mediated Recent Labs    04/28/21 0541 05/07/21 1446 05/08/21 0552 05/09/21 0701 05/10/21 0517 05/11/21 0555 05/12/21 0530 05/13/21 0515 05/14/21 0706 05/15/21 0445  BUN 31* 27* 22 30* 36* 42* 48* 41* 33* 33*  CREATININE 1.30* 1.71* 1.50* 1.44* 1.74* 1.39* 1.32* 1.22 1.13 1.15  -Continue monitoring -Avoid nephrotoxic meds.  Hypotension: Resolved.   Stage IV lung cancer s/p radiation and chemotherapy previously.   -Outpatient follow-up - oncology following    Anemia of chronic disease: H&H stable after 1 unit Recent Labs    04/26/21 0610 04/27/21 0452 04/28/21 0541 05/07/21 1446 05/08/21 0552 05/09/21 0701 05/10/21 0517 05/11/21 0555 05/12/21 0530 05/15/21 0445  HGB 8.8* 8.3* 9.7* 9.9* 9.4* 8.1* 7.6* 9.3* 9.8* 10.3*  -Continue monitoring   Chronic pain syndrome: -Continue oxycodone and bowel regimen as needed.   Depression and Anxiety: Stable -Continue with Fluoxetine 40 g  p.o. daily as well as Olanzapine 10 mg p.o. nightly   Hx of PE: CTA chest negative for PE. -Continue home Eliquis  Physical deconditioning-uses rolling walker at baseline. -Therapy recommends SNF  Leukocytosis/bandemia-likely from steroid.  Goal of care counseling-patient with comorbidity as above.  Very frail and deconditioned.  Poor long-term prognosis.  Appropriately DNR/DNI.  A candidate for hospice -Palliative medicine consulted.  Body mass index is 22.77 kg/m.         DVT prophylaxis:   apixaban (ELIQUIS) tablet 5 mg  Code Status: DNR/DNI Family Communication: Patient and/or RN. Available if any question.  Level of care: Progressive Cardiac Status is: Inpatient  Remains inpatient appropriate because: Due to significant oxygen requirement, work of breathing and need for IV medications and close monitoring       Consultants:  Cardiology Palliative medicine   Sch Meds:  Scheduled Meds:  apixaban  5 mg Oral BID   atorvastatin  40 mg Oral Daily   budesonide (PULMICORT) nebulizer solution  0.5 mg Nebulization BID   Chlorhexidine Gluconate Cloth  6 each Topical Daily   feeding supplement  237 mL Oral TID BM   FLUoxetine  40 mg Oral Daily   furosemide  20 mg Intravenous Q12H   guaiFENesin  1,200 mg Oral BID   ipratropium-albuterol  3 mL  Nebulization TID   OLANZapine  10 mg Oral QHS   predniSONE  40 mg Oral Q breakfast   Continuous Infusions:  sodium chloride 10 mL/hr (05/13/21 1620)   PRN Meds:.sodium chloride, acetaminophen, ALPRAZolam, docusate sodium, guaiFENesin-dextromethorphan, ondansetron (ZOFRAN) IV, ondansetron, oxyCODONE, polyethylene glycol, sodium chloride  Antimicrobials: Anti-infectives (From admission, onward)    Start     Dose/Rate Route Frequency Ordered Stop   05/13/21 1030  ceFEPIme (MAXIPIME) 2 g in sodium chloride 0.9 % 100 mL IVPB        2 g 200 mL/hr over 30 Minutes Intravenous Every 12 hours 05/13/21 0952 05/13/21 1959   05/11/21  1630  azithromycin (ZITHROMAX) 500 mg in sodium chloride 0.9 % 250 mL IVPB        500 mg 250 mL/hr over 60 Minutes Intravenous Every 24 hours 05/11/21 1537 05/13/21 1721   05/08/21 1800  vancomycin (VANCOREADY) IVPB 750 mg/150 mL  Status:  Discontinued        750 mg 150 mL/hr over 60 Minutes Intravenous Every 24 hours 05/08/21 1135 05/09/21 1448   05/08/21 0800  ceFEPIme (MAXIPIME) 2 g in sodium chloride 0.9 % 100 mL IVPB  Status:  Discontinued        2 g 200 mL/hr over 30 Minutes Intravenous Every 12 hours 05/07/21 1929 05/13/21 0952   05/07/21 2200  ceFEPIme (MAXIPIME) 2 g in sodium chloride 0.9 % 100 mL IVPB  Status:  Discontinued        2 g 200 mL/hr over 30 Minutes Intravenous Every 12 hours 05/07/21 1927 05/07/21 1929   05/07/21 1930  vancomycin (VANCOREADY) IVPB 500 mg/100 mL        500 mg 100 mL/hr over 60 Minutes Intravenous  Once 05/07/21 1927 05/07/21 2144   05/07/21 1927  vancomycin variable dose per unstable renal function (pharmacist dosing)  Status:  Discontinued         Does not apply See admin instructions 05/07/21 1927 05/09/21 1448   05/07/21 1600  vancomycin (VANCOCIN) IVPB 1000 mg/200 mL premix        1,000 mg 200 mL/hr over 60 Minutes Intravenous  Once 05/07/21 1552 05/07/21 1801   05/07/21 1600  ceFEPIme (MAXIPIME) 2 g in sodium chloride 0.9 % 100 mL IVPB        2 g 200 mL/hr over 30 Minutes Intravenous  Once 05/07/21 1552 05/07/21 1700        I have personally reviewed the following labs and images: CBC: Recent Labs  Lab 05/09/21 0701 05/10/21 0517 05/11/21 0555 05/12/21 0530 05/15/21 0445  WBC 9.5 14.0* 14.5* 15.3* 14.9*  NEUTROABS  --   --  14.0*  --   --   HGB 8.1* 7.6* 9.3* 9.8* 10.3*  HCT 25.4* 24.1* 28.6* 30.0* 31.6*  MCV 94.4 95.6 95.0 96.5 96.9  PLT 238 228 227 222 176   BMP &GFR Recent Labs  Lab 05/10/21 0517 05/11/21 0555 05/12/21 0530 05/13/21 0515 05/14/21 0706 05/15/21 0445  NA 140 142 142 141 143 140  K 4.5 4.3 4.6 4.7 4.0  4.1  CL 103 105 105 103 104 102  CO2 26 28 29 29  32 34*  GLUCOSE 156* 131* 146* 120* 96 109*  BUN 36* 42* 48* 41* 33* 33*  CREATININE 1.74* 1.39* 1.32* 1.22 1.13 1.15  CALCIUM 8.9 8.9 9.0 9.1 9.0 8.5*  MG 2.0 2.0 1.9 1.6* 2.1 2.1  PHOS 5.0* 4.8* 2.6 2.4*  --  3.6   Estimated Creatinine Clearance: 53.9 mL/min (  by C-G formula based on SCr of 1.15 mg/dL). Liver & Pancreas: Recent Labs  Lab 05/11/21 0555 05/12/21 0530 05/13/21 0515  AST 20  --   --   ALT 19  --   --   ALKPHOS 74  --   --   BILITOT 0.8  --   --   PROT 5.5*  --   --   ALBUMIN 2.3* 2.4* 2.5*   No results for input(s): LIPASE, AMYLASE in the last 168 hours. No results for input(s): AMMONIA in the last 168 hours. Diabetic: No results for input(s): HGBA1C in the last 72 hours. Recent Labs  Lab 05/12/21 1153 05/12/21 1621 05/12/21 2022  GLUCAP 170* 177* 132*   Cardiac Enzymes: No results for input(s): CKTOTAL, CKMB, CKMBINDEX, TROPONINI in the last 168 hours. No results for input(s): PROBNP in the last 8760 hours. Coagulation Profile: No results for input(s): INR, PROTIME in the last 168 hours.  Thyroid Function Tests: No results for input(s): TSH, T4TOTAL, FREET4, T3FREE, THYROIDAB in the last 72 hours. Lipid Profile: No results for input(s): CHOL, HDL, LDLCALC, TRIG, CHOLHDL, LDLDIRECT in the last 72 hours.  Anemia Panel: No results for input(s): VITAMINB12, FOLATE, FERRITIN, TIBC, IRON, RETICCTPCT in the last 72 hours.  Urine analysis:    Component Value Date/Time   COLORURINE YELLOW (A) 05/07/2021 2044   APPEARANCEUR CLEAR (A) 05/07/2021 2044   LABSPEC 1.021 05/07/2021 2044   PHURINE 6.0 05/07/2021 2044   GLUCOSEU NEGATIVE 05/07/2021 2044   HGBUR NEGATIVE 05/07/2021 2044   BILIRUBINUR NEGATIVE 05/07/2021 2044   [place] 05/07/2021 2044   PROTEINUR NEGATIVE 05/07/2021 2044   NITRITE NEGATIVE 05/07/2021 2044   LEUKOCYTESUR NEGATIVE 05/07/2021 2044   Sepsis Labs: Invalid input(s):  PROCALCITONIN, Otho  Microbiology: Recent Results (from the past 240 hour(s))  Culture, blood (Routine x 2)     Status: None   Collection Time: 05/07/21  2:46 PM   Specimen: BLOOD  Result Value Ref Range Status   Specimen Description BLOOD PORTA CATH  Final   Special Requests   Final    BOTTLES DRAWN AEROBIC AND ANAEROBIC Blood Culture results may not be optimal due to an excessive volume of blood received in culture bottles   Culture   Final    NO GROWTH 5 DAYS Performed at [facility], [address] [place], [place] [address]    Report Status 05/12/2021 FINAL  Final  Culture, blood (Routine x 2)     Status: None   Collection Time: 05/07/21  2:46 PM   Specimen: BLOOD  Result Value Ref Range Status   Specimen Description BLOOD RIGHT ANTECUBITAL  Final   Special Requests   Final    BOTTLES DRAWN AEROBIC AND ANAEROBIC Blood Culture results may not be optimal due to an excessive volume of blood received in culture bottles   Culture   Final    NO GROWTH 5 DAYS Performed at [facility], [address] [place], [place] [address]    Report Status 05/12/2021 FINAL  Final  Resp Panel by RT-PCR (Flu A&B, Covid) Nasopharyngeal Swab     Status: None   Collection Time: 05/07/21  5:57 PM   Specimen: Nasopharyngeal Swab; Nasopharyngeal(NP) swabs in vial transport medium  Result Value Ref Range Status   SARS Coronavirus 2 by RT PCR NEGATIVE NEGATIVE Final    Comment: (NOTE) SARS-CoV-2 target nucleic acids are NOT DETECTED.  The SARS-CoV-2 RNA is generally detectable in upper respiratory specimens during the acute phase  of infection. The lowest concentration of SARS-CoV-2 viral copies this assay can detect is 138 copies/mL. A negative result does not preclude SARS-Cov-2 infection and should not be used as the sole basis for treatment or other patient management decisions. A negative result may occur with  improper specimen collection/handling, submission of  specimen other than nasopharyngeal swab, presence of viral mutation(s) within the areas targeted by this assay, and inadequate number of viral copies(<138 copies/mL). A negative result must be combined with clinical observations, patient history, and epidemiological information. The expected result is Negative.  Fact Sheet for Patients:  EntrepreneurPulse.com.au  Fact Sheet for Healthcare Providers:  IncredibleEmployment.be  This test is no t yet approved or cleared by the [place] FDA and  has been authorized for detection and/or diagnosis of SARS-CoV-2 by FDA under an Emergency Use Authorization (EUA). This EUA will remain  in effect (meaning this test can be used) for the duration of the COVID-19 declaration under Section 564(b)(1) of the Act, 21 U.S.C.section 360bbb-3(b)(1), unless the authorization is terminated  or revoked sooner.       Influenza A by PCR NEGATIVE NEGATIVE Final   Influenza B by PCR NEGATIVE NEGATIVE Final    Comment: (NOTE) The Xpert Xpress SARS-CoV-2/FLU/RSV plus assay is intended as an aid in the diagnosis of influenza from Nasopharyngeal swab specimens and should not be used as a sole basis for treatment. Nasal washings and aspirates are unacceptable for Xpert Xpress SARS-CoV-2/FLU/RSV testing.  Fact Sheet for Patients: EntrepreneurPulse.com.au  Fact Sheet for Healthcare Providers: IncredibleEmployment.be  This test is not yet approved or cleared by the [place] FDA and has been authorized for detection and/or diagnosis of SARS-CoV-2 by FDA under an Emergency Use Authorization (EUA). This EUA will remain in effect (meaning this test can be used) for the duration of the COVID-19 declaration under Section 564(b)(1) of the Act, 21 U.S.C. section 360bbb-3(b)(1), unless the authorization is terminated or revoked.  Performed at [facility], [address] [place], [place] [address]   MRSA Next Gen by PCR, Nasal     Status: None   Collection Time: 05/09/21  1:16 PM   Specimen: Nasal Mucosa; Nasal Swab  Result Value Ref Range Status   MRSA by PCR Next Gen NOT DETECTED NOT DETECTED Final    Comment: (NOTE) The GeneXpert MRSA Assay (FDA approved for NASAL specimens only), is one component of a comprehensive MRSA colonization surveillance program. It is not intended to diagnose MRSA infection nor to guide or monitor treatment for MRSA infections. Test performance is not FDA approved in patients less than 61 years old. Performed at [facility], [address] [place], [place] [address]     Radiology Studies: CT CHEST WO CONTRAST  Result Date: 05/15/2021 CLINICAL DATA:  Unresolving Pulmonary infiltrates, R/O lymphangitic spread, pt has AdenoCA lung EXAM: CT CHEST WITHOUT CONTRAST TECHNIQUE: Multidetector CT imaging of the chest was performed following the standard protocol without IV contrast. COMPARISON:  Multiple priors FINDINGS: Cardiovascular: Normal heart size. No pericardial effusion. Thoracic aorta is normal in caliber with scattered atherosclerotic calcifications. The proximal pulmonary arteries are normal in caliber. There is a right-sided chest port with central venous catheter entering the right internal jugular vein and tip terminating near the superior cavoatrial junction. Mediastinum/Nodes: No enlarged mediastinal, hilar, or axillary lymph nodes. The thyroid gland appears normal. Lungs/Pleura: Right pleural thickening without significant effusion appreciated. No pneumothorax. Extensive centrilobular emphysematous changes in the lungs. Again seen is the right lower lobe  mass which measures up to 2.6 cm, essentially unchanged since previous exam. Again seen are similar distribution and severity of bilateral pulmonary opacities, which can be described as more coarse interstitial opacities with ground-glass opacity in the inferior  aspect of both the left upper and lower lobes, and more confluent peribronchiolar consolidation in the right lower lobe. Musculoskeletal: No aggressive osseous lesions. Unchanged severe compression fracture the T4 vertebral body. Upper abdomen: The visualized upper abdomen is unremarkable. IMPRESSION: Essentially stable appearance of the lungs including right lower lobe mass and bilateral interstitial and peribronchialar consolidative opacities as described in a background of extensive emphysematous changes. Although nonspecific, findings are favored to represent multifocal infectious process versus inflammatory pneumonitis in the context of immunotherapy administration, and less likely lymphangitic carcinomatosis. Electronically Signed   By: Albin Felling M.D.   On: 05/15/2021 15:00       Val Riles, MD [facility]  If 7PM-7AM, please contact night-coverage [email or web address] 05/15/2021, 5:02 PM

## 2021-05-15 NOTE — Progress Notes (Signed)
OT Cancellation Note  Patient Details Name: Niki Cosman MRN: [id number] DOB: [birth year]   Cancelled Treatment:    Reason Eval/Treat Not Completed: Fatigue/lethargy limiting ability to participate;Patient declined, no reason specified. OT continues to follow pt for acute therapy services. Pt received semi-supine in bed, politely declines therapy this date 2/2 fatigue from recent "scan". Will hold OT services at this time and re-attempt at a later date/time as available and pt medically appropriate.   Shara Blazing, M.S., OTR/L Ascom: [phone] 05/15/21, 2:51 PM

## 2021-05-15 NOTE — Progress Notes (Addendum)
NAME:  Jimmy West, MRN:  [id number], DOB:  August 22, 1950, LOS: 8 ADMISSION DATE:  05/07/2021, INITIAL CONSULTATION DATE: 05/08/2021 REFERRING MD: Dr. Billie Ruddy, follow-up requested by Dr. Dwyane Dee CHIEF COMPLAINT: Shortness of breath  Follow-up requested from initial consultation on 08 May 2021.  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 Patient confirms DNR/DNI code status today and was followed by palliative care team during previous admission. He has one living brother who lives in [place] and has not spoken to him in years. His preferred contact person is Jimmy West (information in patient records). Patient lives alone and does not have assistance at home.    Patient presented to the ER via EMS last night 10/18 with chief complaint of shortness of breath. Patient reports that he has not felt well since his hospital discharge with profound weakness, too weak to eat, and worsening shortness of breath. He reports that he started wearing his home oxygen all of the time due to not feeling well which helped his pulmonary symptoms some. He states he was not using his prescribed inhaler because the pump was malfunctioning on the handheld device. He also describes wheezing and dry, nonproductive cough which have worsened in the last several days. He denies recent fever, chills, sweating, nausea, vomiting, swelling. He reports small volume loose stools occasionally which is not his routine bowel movements.    On arrival to the ER, patient with noted  oxygen saturations in the 80s on room air by EMS with blood pressure systolic in the 28N after a 200cc fluid bolus. Vital signs on arrival pulse 97, BP 103/74, RR 36, oxygen saturations 91% on 6L, afebrile. Patient noted to easily desaturate with activity, speaking, and any interruption to his oxygen flow such as when he was transitioned to 8L high flow nasal cannula with humidifier. Labs noted WBC 16.6, hgb 9.9, Creatinine 1.71, BNP 1105, troponin 3153 and 3506 on repeat. COVID and flu screening negative. Imaging: CXR findings of new left lower lobe airspace process suspicious for pneumonia with extensive right lower lobe density consistent with radiation change. D-dimer was elevated, and CTA of the chest was done which was negative for PE. Patient had sepsis bundle initiated and received 2 liters LR fluid resuscitation, empiric vancomycin and cefepime in the ER. He was started on a heparin continuous infusion for NSTEMI suspected demand ischemia on history of CAD with remote PCI.    PCCM was consulted to assess patient. He is currently being seen in the ER pending bed placement inpatient, tolerating 8 liters highflow oxygen cannula at oxygen saturations 92-93%; however, he has noted increased work of breathing with accessory muscles of breathing use, shortness of breath worsening with conversation with short shallow breaths between words. Patient is alert, oriented to self, location, situation and time. He denies any other recent events other than what is described above. He has never used a BiPAP or other NIPPV support device before but is agreeable to trial on BiPAP for a short duration if his respiratory condition worsens. He confirms DNI status as part of DNR and that he would not want intubation to  support his breathing.   Hospital course from 19 October till now: Patient has presented with acute on chronic respiratory failure with hypoxia with CT imaging with extensive differential diagnosis.  He has  been treated as "worsening pneumonia" with increasing FiO2 requirements in the process.  He is currently on steroids.  Had initial improvement on symptoms however, now has seemed to plateau.  We are asked to render opinion as to how long steroids will be necessary.  He is currently requiring high flow O2 at 60%.  Pertinent  Medical History   Stage IV adenocarcinoma of the lung Myocardial infarction in the past Remote history of PE on Eliquis CAD hx PCI 2005 Chronic anemia secondary to cancer treatment Port-a-cath right chest Recently discharged 04/28/21 after treatment of aspiration pneumonia  Significant Hospital Events: Including procedures, antibiotic start and stop dates in addition to other pertinent events   10/18: Brought by EMS to ER with cc: sob, hypoxic sats 80% on RA and hypotensive. CXR concerning for LUL pna, sepsis with NSTEMI demand ischemia on workup. Started on cefepime, vancomycin, heparin continuous infusion and assigned to hospitalist service pending bed placement.  10/19: Evaluated by PCCM significant pulm history with recurrent pneumonia ? Aspiration versus HCAP, sepsis, NSTEMI, hypotension. Stable sats on 8lpm highflow oxygen, with increased WOB and accessory muscle use.  10/26: PCCM asked to reevaluate for determination of length of steroid therapy  Interim History / Subjective:  States that he "feels a little better" however mobility is still very limited.  Looks very debilitated.  Moderate conversational dyspnea.  Objective   Blood pressure 100/67, pulse (!) 102, temperature 98.2 F (36.8 C), temperature source Oral, resp. rate 18, height 5\' 6"  (1.676 m), weight 64 kg, SpO2 92 %.    FiO2 (%):  [54 %-60 %] 60 %   Intake/Output Summary (Last 24 hours) at 05/15/2021 1724 Last data filed at 05/15/2021 1526 Gross per 24 hour  Intake 720 ml  Output 3725 ml  Net -3005 ml   Filed Weights   05/07/21 1439 05/10/21 0901 05/10/21 1537  Weight: 58.4 kg 63.2 kg 64 kg     Examination: GENERAL: Frail, debilitated appearing gentleman, moderate conversational dyspnea, on high flow O2 via nasal cannula. HEAD: Normocephalic, atraumatic.  EYES: Pupils equal, round, reactive to light.  No scleral icterus.  MOUTH: Relatively dry oral mucosa, poor dentition. NECK: Supple. No thyromegaly. Trachea midline. No JVD.  No adenopathy. PULMONARY: Good air entry bilaterally.  Coarse throughout, crackles at bases.   CARDIOVASCULAR: S1 and S2. Regular rate and rhythm.  No rubs, murmurs or gallops heard. ABDOMEN: Scaphoid, otherwise benign. MUSCULOSKELETAL: No joint deformity, no clubbing, no edema.  Diffuse muscle wasting. NEUROLOGIC: No overt focal deficit, gait not tested.   SKIN: Intact,warm,dry. PSYCH: Mildly confused but easily redirectable.  Resolved Hospital Problem list   N/AA  Assessment & Plan:  Severe ACUTE on chronic Hypoxic Respiratory Failure Interstitial and airspace process both lungs Recent hospitalization for aspiration pneumonia (DC 04/28/21) Stage IV adenocarcinoma of the lung with mets DDX: Pemetrexed/Pembrolizumab toxicity, lymphangitic spread of CA, opportunistic infection (nonbacterial) - Oxygen supplementation to maintain O2 sats > 88% - Heated high flow for now taper as tolerated - Continue duonebs q4h - Continue prednisone at 40 mg daily - MRSA screen not resulted - Procalcitonin's have been negative, check KL6, inflammatory markers, LDH, Fungitell, immunoglobulins - Repeated CT chest   Pneumonia ruled out Procalcitonin is normal Persistent findings on CT with differential as above -No need for antibiotics at  present -Aspiration precautions   ACUTE KIDNEY INJURY/Renal Failure - Avoid nephrotoxic agents as able - Follow urine output, BMP - Monitor closely on PJP prophylaxis - Ensure adequate renal perfusion, optimize oxygenation  Anemia, chronic secondary to cancer treatment - stable - Monitor routine CBC   Elevated d-dimer   History PE on Eliquis CTA Chest 10/18 negative for PE - Continue Eliquis   Radiologic studies  Representative image from CT angio chest obtained 07 May 2021:  Interstitial and airspace process in both lungs  Representative image from CT chest obtained today (26 October):   Persistent interstitial process   Labs   CBC: Recent Labs  Lab 05/09/21 0701 05/10/21 0517 05/11/21 0555 05/12/21 0530 05/15/21 0445  WBC 9.5 14.0* 14.5* 15.3* 14.9*  NEUTROABS  --   --  14.0*  --   --   HGB 8.1* 7.6* 9.3* 9.8* 10.3*  HCT 25.4* 24.1* 28.6* 30.0* 31.6*  MCV 94.4 95.6 95.0 96.5 96.9  PLT 238 228 227 222 169    Basic Metabolic Panel: Recent Labs  Lab 05/10/21 0517 05/11/21 0555 05/12/21 0530 05/13/21 0515 05/14/21 0706 05/15/21 0445  NA 140 142 142 141 143 140  K 4.5 4.3 4.6 4.7 4.0 4.1  CL 103 105 105 103 104 102  CO2 26 28 29 29  32 34*  GLUCOSE 156* 131* 146* 120* 96 109*  BUN 36* 42* 48* 41* 33* 33*  CREATININE 1.74* 1.39* 1.32* 1.22 1.13 1.15  CALCIUM 8.9 8.9 9.0 9.1 9.0 8.5*  MG 2.0 2.0 1.9 1.6* 2.1 2.1  PHOS 5.0* 4.8* 2.6 2.4*  --  3.6   GFR: Estimated Creatinine Clearance: 53.9 mL/min (by C-G formula based on SCr of 1.15 mg/dL). Recent Labs  Lab 05/10/21 0517 05/11/21 0555 05/12/21 0530 05/15/21 0445  WBC 14.0* 14.5* 15.3* 14.9*    Liver Function Tests: Recent Labs  Lab 05/11/21 0555 05/12/21 0530 05/13/21 0515  AST 20  --   --   ALT 19  --   --   ALKPHOS 74  --   --   BILITOT 0.8  --   --   PROT 5.5*  --   --   ALBUMIN 2.3* 2.4* 2.5*   No results for input(s): LIPASE, AMYLASE in the last 168 hours. No results for input(s): AMMONIA in the last 168 hours.  ABG    Component Value Date/Time   PHART 7.51 (H) 05/14/2021 1459   PCO2ART 38 05/14/2021 1459   PO2ART 51 (L) 05/14/2021 1459   HCO3 30.3 (H) 05/14/2021 1459   O2SAT 89.4 05/14/2021 1459     Coagulation Profile: No results for input(s): INR, PROTIME in the last 168  hours.  Cardiac Enzymes: No results for input(s): CKTOTAL, CKMB, CKMBINDEX, TROPONINI in the last 168 hours.  HbA1C: No results found for: HGBA1C  CBG: Recent Labs  Lab 05/12/21 1153 05/12/21 1621 05/12/21 2022  GLUCAP 170* 177* 132*    Review of Systems:   A 10 point review of systems was performed and it is as noted above otherwise negative.  Allergies No Known Allergies   Scheduled Meds:  apixaban  5 mg Oral BID   atorvastatin  40 mg Oral Daily   budesonide (PULMICORT) nebulizer solution  0.5 mg Nebulization BID   Chlorhexidine Gluconate Cloth  6 each Topical Daily   feeding supplement  237 mL Oral TID BM   FLUoxetine  40 mg Oral Daily   furosemide  20 mg Intravenous Q12H   guaiFENesin  1,200  mg Oral BID   ipratropium-albuterol  3 mL Nebulization TID   OLANZapine  10 mg Oral QHS   predniSONE  40 mg Oral Q breakfast   Continuous Infusions:  sodium chloride 10 mL/hr (05/13/21 1620)   PRN Meds:.sodium chloride, acetaminophen, ALPRAZolam, docusate sodium, guaiFENesin-dextromethorphan, ondansetron (ZOFRAN) IV, ondansetron, oxyCODONE, polyethylene glycol, sodium chloride   Level 3 follow-up    Discussion: Patient appears to have more of an interstitial process that is failing to improve this is either steroid refractory immune checkpoint inhibitor (ICI) induced pneumonitis, lymphangitic spread of tumor, or nonbacterial opportunistic infection.  Will obtain studies as above.  Irene Limbo 6 has been shown useful in determining ILD from other modalities.  For now continue prednisone at 40 mg daily.  He should be on PJP prophylaxis. With lack of improvement thus far, his prognosis is exceedingly guarded.   Renold Don, MD Advanced Bronchoscopy PCCM [facility]-    *This note was dictated using voice recognition software/Dragon.  Despite best efforts to proofread, errors can occur which can change the meaning.  Any change was purely unintentional.

## 2021-05-16 DIAGNOSIS — R918 Other nonspecific abnormal finding of lung field: Secondary | ICD-10-CM | POA: Diagnosis not present

## 2021-05-16 DIAGNOSIS — J9621 Acute and chronic respiratory failure with hypoxia: Secondary | ICD-10-CM | POA: Diagnosis not present

## 2021-05-16 DIAGNOSIS — J9601 Acute respiratory failure with hypoxia: Secondary | ICD-10-CM | POA: Diagnosis not present

## 2021-05-16 LAB — BASIC METABOLIC PANEL
Anion gap: 6 (ref 5–15)
BUN: 41 mg/dL — ABNORMAL HIGH (ref 8–23)
CO2: 34 mmol/L — ABNORMAL HIGH (ref 22–32)
Calcium: 8.7 mg/dL — ABNORMAL LOW (ref 8.9–10.3)
Chloride: 99 mmol/L (ref 98–111)
Creatinine, Ser: 1.41 mg/dL — ABNORMAL HIGH (ref 0.61–1.24)
GFR, Estimated: 54 mL/min — ABNORMAL LOW (ref >60)
Glucose, Bld: 129 mg/dL — ABNORMAL HIGH (ref 70–99)
Potassium: 3.8 mmol/L (ref 3.5–5.1)
Sodium: 139 mmol/L (ref 135–145)

## 2021-05-16 LAB — CBC
HCT: 33.6 % — ABNORMAL LOW (ref 39.0–52.0)
Hemoglobin: 11 g/dL — ABNORMAL LOW (ref 13.0–17.0)
MCH: 31.5 pg (ref 26.0–34.0)
MCHC: 32.7 g/dL (ref 30.0–36.0)
MCV: 96.3 fL (ref 80.0–100.0)
Platelets: 196 10*3/uL (ref 150–400)
RBC: 3.49 MIL/uL — ABNORMAL LOW (ref 4.22–5.81)
RDW: 19.3 % — ABNORMAL HIGH (ref 11.5–15.5)
WBC: 17.5 10*3/uL — ABNORMAL HIGH (ref 4.0–10.5)
nRBC: 0 % (ref 0.0–0.2)

## 2021-05-16 LAB — LACTATE DEHYDROGENASE: LDH: 239 U/L — ABNORMAL HIGH (ref 98–192)

## 2021-05-16 LAB — C-REACTIVE PROTEIN: CRP: 3.9 mg/dL — ABNORMAL HIGH (ref <1.0)

## 2021-05-16 LAB — SEDIMENTATION RATE: Sed Rate: 66 mm/hr — ABNORMAL HIGH (ref 0–20)

## 2021-05-16 LAB — PHOSPHORUS: Phosphorus: 4.5 mg/dL (ref 2.5–4.6)

## 2021-05-16 LAB — MAGNESIUM: Magnesium: 1.9 mg/dL (ref 1.7–2.4)

## 2021-05-16 MED ORDER — SULFAMETHOXAZOLE-TRIMETHOPRIM 800-160 MG PO TABS
1.0000 | ORAL_TABLET | ORAL | Status: DC
  Administered 2021-05-17 – 2021-06-12 (×12): 1 via ORAL
  Filled 2021-05-16 (×15): qty 1

## 2021-05-16 NOTE — Progress Notes (Signed)
PROGRESS NOTE  Jimmy West CBU:[id number] DOB: May 30, 1951   PCP: Pcp, No  Patient is from: Home.  Uses rolling walker at baseline.  DOA: 05/07/2021 LOS: 9  Chief complaints:  Chief Complaint  Patient presents with   Shortness of Breath     Brief Narrative / Interim history: 70 year old M with PMH of stage IV RLL lung cancer, cancer pain, CAD/stent in 2005, chronic respiratory failure with hypoxia on 2 L, anemia of chronic disease, anxiety, depression and debility presenting with generalized weakness, shortness of breath and dry cough, and admitted for acute on chronic hypoxic respiratory failure in the setting of LLL pneumonia and non-STEMI.  Started on broad-spectrum antibiotics, steroid and IV heparin.  Cardiology consulted recommended medical management and [place] outpatient.  CTA chest negative for PE.  Continues to require up to 6 L by [place] at rest. 10/25 patient became hypoxic and required directed oxygen, started high flow nasal cannula, FiO2 54%, inpatient setting around 90s  Subjective: No significant overnight events, patient still has significant shortness of breath, comfortable on high flow nasal cannula.  Denies any chest pain palpitations, no any other active issues.  Patient has chronic backache which is under control now.   Objective: Vitals:   05/16/21 0730 05/16/21 0815 05/16/21 1117 05/16/21 1433  BP: 99/70  96/61   Pulse: 87 96 98 95  Resp: 18 14 17 18   Temp: 98 F (36.7 C)  98 F (36.7 C)   TempSrc:   Oral   SpO2: 95% 94% 91% 93%  Weight:      Height:        Intake/Output Summary (Last 24 hours) at 05/16/2021 1444 Last data filed at 05/16/2021 1400 Gross per 24 hour  Intake 1560 ml  Output 3660 ml  Net -2100 ml   Filed Weights   05/07/21 1439 05/10/21 0901 05/10/21 1537  Weight: 58.4 kg 63.2 kg 64 kg    Examination:  GENERAL: Frail looking elderly male.  No apparent distress. HEENT: MMM.  RESP: 90% on 6 L.  Hardly speaks in full  sentence.  Crackles over LLL.  Diminished aeration.  Few scatterec rhonchi CVS:  RRR. Heart sounds normal.  ABD/GI/GU: BS+. Abd soft, NTND.  MSK/EXT:  Moves extremities. No apparent deformity. No edema.  SKIN: no apparent skin lesion or wound. Right subclavian portacath NEURO: Awake and alert. Oriented appropriately.  No apparent focal neuro deficit. PSYCH: Calm. Normal affect.   Procedures:  None  Microbiology summarized: EHOZY-24 and influenza PCR nonreactive. MRSA PCR screen negative. Blood cultures NGTD.  Assessment & Plan: Acute on chronic respiratory failure with hypoxia due to LLL pneumonia with superimposed stage IV RLL lung cancer-on 2 L at baseline.  Currently requiring 6 L.  CTA chest negative for PE but concern for LLL pneumonia with RLL scarring from known lung cancer.  Pro-Cal elevated to 1.38.  Blood cultures negative. On cefepime since 10/18 making today day 7. S/p vancomycin - s/p Cefepime completed 7 days. S/p azithromycin 500 x 3 doses  - check CXR to eval for parapneumonic effusion which would require extended course abx - stop solumedrol, start oral prednisone, plan to wean -Wean oxygen as able. -IS/OOB/PT/OT - palliative consulted - Dr. B of oncology aware of the patient 10/25 oxygen requirement increased, patient is on HFNC 54% FiO2, patient is DNR/DNI, we will continue to monitor and start BiPAP if needed. ABG consistent with hypoxic respiratory failure CXR: Persistent interstitial and airspace opacities at the lung bases with slight interval  improvement. Remains concerning for multifocal pneumonia or pneumonitis. Known pulmonary mass not well evaluated S/p Lasix 20 mg IV twice daily,d/c;d on 10/27 due to slightly elevated creatinine Continue fluid restriction 1.5 L/day 10/26 discussed with the pulmonologist,  CT chest: stable RLL mass and bilateral interstitial and peribronchialar consolidative opacities,  extensive emphysematous changes, multifocal infectious  process vs inflammatory  Pneumonitis could be due to immunotherapy administration, and less likely lymphangitic carcinomatosis. 10/27 as per pulmonologist patient will need prednisone 40 mg daily and will need to 4 to 6 weeks slow taper, started PJP prophylaxis    Severe sepsis due to pneumonia:tachycardia, tachypnea, leukocytosis, AKI with pneumonia POA. Sepsis physiology resolved -Managementas above.   NSTEMI/history of CAD/stent in 2005: Troponin 3153 -> 3506 -> 2927 -> 2894.  EKG with T wave changes in lateral leads but no dynamic changes.  TTE with LVEF of 55 to 60%, G1-DD and no RWMA chest pain-free.  -Cardiology following -Completed 48 hours of IV heparin and transition back to home Eliquis. -Medical management due to poor candidacy for heart cath in the setting of respiratory failure -Cardiology recommends outpatient follow-up when stable from respiratory standpoint.  Chronic diastolic CHF: TTE as above.  Appears euvolemic on exam.  Does not seem to be on diuretics at home.  About 1.6 L UOP/24 hours. -Monitor fluid status   AKI/azotemia on CKD-3A: Improved.  Does not seem to be on nephrotoxic meds.  Could be hemodynamically mediated Recent Labs    05/07/21 1446 05/08/21 0552 05/09/21 0701 05/10/21 0517 05/11/21 0555 05/12/21 0530 05/13/21 0515 05/14/21 0706 05/15/21 0445 05/16/21 0603  BUN 27* 22 30* 36* 42* 48* 41* 33* 33* 41*  CREATININE 1.71* 1.50* 1.44* 1.74* 1.39* 1.32* 1.22 1.13 1.15 1.41*  -Continue monitoring -Avoid nephrotoxic meds.  Hypotension: Resolved.   Stage IV lung cancer s/p radiation and chemotherapy previously.   -Outpatient follow-up - oncology following    Anemia of chronic disease: H&H stable after 1 unit Recent Labs    04/27/21 0452 04/28/21 0541 05/07/21 1446 05/08/21 0552 05/09/21 0701 05/10/21 0517 05/11/21 0555 05/12/21 0530 05/15/21 0445 05/16/21 0603  HGB 8.3* 9.7* 9.9* 9.4* 8.1* 7.6* 9.3* 9.8* 10.3* 11.0*  -Continue  monitoring   Chronic pain syndrome: -Continue oxycodone and bowel regimen as needed.   Depression and Anxiety: Stable -Continue with Fluoxetine 40 g p.o. daily as well as Olanzapine 10 mg p.o. nightly   Hx of PE: CTA chest negative for PE. -Continue home Eliquis  Physical deconditioning-uses rolling walker at baseline. -Therapy recommends SNF  Leukocytosis/bandemia-likely from steroid.  Goal of care counseling-patient with comorbidity as above.  Very frail and deconditioned.  Poor long-term prognosis.  Appropriately DNR/DNI.  A candidate for hospice -Palliative medicine consulted.  Body mass index is 22.77 kg/m.        DVT prophylaxis:   apixaban (ELIQUIS) tablet 5 mg  Code Status: DNR/DNI Family Communication: Patient and/or RN. Available if any question.  Level of care: Progressive Cardiac Status is: Inpatient  Remains inpatient appropriate because: Due to significant oxygen requirement, work of breathing and need for IV medications and close monitoring       Consultants:  Cardiology Palliative medicine   Sch Meds:  Scheduled Meds:  apixaban  5 mg Oral BID   atorvastatin  40 mg Oral Daily   budesonide (PULMICORT) nebulizer solution  0.5 mg Nebulization BID   Chlorhexidine Gluconate Cloth  6 each Topical Daily   feeding supplement  237 mL Oral TID BM   FLUoxetine  40 mg Oral Daily   guaiFENesin  1,200 mg Oral BID   ipratropium-albuterol  3 mL Nebulization TID   OLANZapine  10 mg Oral QHS   predniSONE  40 mg Oral Q breakfast   [START ON 05/17/2021] sulfamethoxazole-trimethoprim  1 tablet Oral Once per day on Mon Wed Fri   Continuous Infusions:  sodium chloride 10 mL/hr (05/13/21 1620)   PRN Meds:.sodium chloride, acetaminophen, ALPRAZolam, docusate sodium, guaiFENesin-dextromethorphan, ondansetron (ZOFRAN) IV, ondansetron, oxyCODONE, polyethylene glycol, sodium chloride  Antimicrobials: Anti-infectives (From admission, onward)    Start     Dose/Rate  Route Frequency Ordered Stop   05/17/21 0900  sulfamethoxazole-trimethoprim (BACTRIM DS) 800-160 MG per tablet 1 tablet        1 tablet Oral Once per day on Mon Wed Fri 05/16/21 0935     05/13/21 1030  ceFEPIme (MAXIPIME) 2 g in sodium chloride 0.9 % 100 mL IVPB        2 g 200 mL/hr over 30 Minutes Intravenous Every 12 hours 05/13/21 0952 05/13/21 1959   05/11/21 1630  azithromycin (ZITHROMAX) 500 mg in sodium chloride 0.9 % 250 mL IVPB        500 mg 250 mL/hr over 60 Minutes Intravenous Every 24 hours 05/11/21 1537 05/13/21 1721   05/08/21 1800  vancomycin (VANCOREADY) IVPB 750 mg/150 mL  Status:  Discontinued        750 mg 150 mL/hr over 60 Minutes Intravenous Every 24 hours 05/08/21 1135 05/09/21 1448   05/08/21 0800  ceFEPIme (MAXIPIME) 2 g in sodium chloride 0.9 % 100 mL IVPB  Status:  Discontinued        2 g 200 mL/hr over 30 Minutes Intravenous Every 12 hours 05/07/21 1929 05/13/21 0952   05/07/21 2200  ceFEPIme (MAXIPIME) 2 g in sodium chloride 0.9 % 100 mL IVPB  Status:  Discontinued        2 g 200 mL/hr over 30 Minutes Intravenous Every 12 hours 05/07/21 1927 05/07/21 1929   05/07/21 1930  vancomycin (VANCOREADY) IVPB 500 mg/100 mL        500 mg 100 mL/hr over 60 Minutes Intravenous  Once 05/07/21 1927 05/07/21 2144   05/07/21 1927  vancomycin variable dose per unstable renal function (pharmacist dosing)  Status:  Discontinued         Does not apply See admin instructions 05/07/21 1927 05/09/21 1448   05/07/21 1600  vancomycin (VANCOCIN) IVPB 1000 mg/200 mL premix        1,000 mg 200 mL/hr over 60 Minutes Intravenous  Once 05/07/21 1552 05/07/21 1801   05/07/21 1600  ceFEPIme (MAXIPIME) 2 g in sodium chloride 0.9 % 100 mL IVPB        2 g 200 mL/hr over 30 Minutes Intravenous  Once 05/07/21 1552 05/07/21 1700        I have personally reviewed the following labs and images: CBC: Recent Labs  Lab 05/10/21 0517 05/11/21 0555 05/12/21 0530 05/15/21 0445 05/16/21 0603   WBC 14.0* 14.5* 15.3* 14.9* 17.5*  NEUTROABS  --  14.0*  --   --   --   HGB 7.6* 9.3* 9.8* 10.3* 11.0*  HCT 24.1* 28.6* 30.0* 31.6* 33.6*  MCV 95.6 95.0 96.5 96.9 96.3  PLT 228 227 222 176 196   BMP &GFR Recent Labs  Lab 05/11/21 0555 05/12/21 0530 05/13/21 0515 05/14/21 0706 05/15/21 0445 05/16/21 0603  NA 142 142 141 143 140 139  K 4.3 4.6 4.7 4.0 4.1 3.8  CL 105  105 103 104 102 99  CO2 28 29 29  32 34* 34*  GLUCOSE 131* 146* 120* 96 109* 129*  BUN 42* 48* 41* 33* 33* 41*  CREATININE 1.39* 1.32* 1.22 1.13 1.15 1.41*  CALCIUM 8.9 9.0 9.1 9.0 8.5* 8.7*  MG 2.0 1.9 1.6* 2.1 2.1 1.9  PHOS 4.8* 2.6 2.4*  --  3.6 4.5   Estimated Creatinine Clearance: 44 mL/min (A) (by C-G formula based on SCr of 1.41 mg/dL (H)). Liver & Pancreas: Recent Labs  Lab 05/11/21 0555 05/12/21 0530 05/13/21 0515  AST 20  --   --   ALT 19  --   --   ALKPHOS 74  --   --   BILITOT 0.8  --   --   PROT 5.5*  --   --   ALBUMIN 2.3* 2.4* 2.5*   No results for input(s): LIPASE, AMYLASE in the last 168 hours. No results for input(s): AMMONIA in the last 168 hours. Diabetic: No results for input(s): HGBA1C in the last 72 hours. Recent Labs  Lab 05/12/21 1153 05/12/21 1621 05/12/21 2022  GLUCAP 170* 177* 132*   Cardiac Enzymes: No results for input(s): CKTOTAL, CKMB, CKMBINDEX, TROPONINI in the last 168 hours. No results for input(s): PROBNP in the last 8760 hours. Coagulation Profile: No results for input(s): INR, PROTIME in the last 168 hours.  Thyroid Function Tests: No results for input(s): TSH, T4TOTAL, FREET4, T3FREE, THYROIDAB in the last 72 hours. Lipid Profile: No results for input(s): CHOL, HDL, LDLCALC, TRIG, CHOLHDL, LDLDIRECT in the last 72 hours.  Anemia Panel: No results for input(s): VITAMINB12, FOLATE, FERRITIN, TIBC, IRON, RETICCTPCT in the last 72 hours.  Urine analysis:    Component Value Date/Time   COLORURINE YELLOW (A) 05/07/2021 2044   APPEARANCEUR CLEAR (A)  05/07/2021 2044   LABSPEC 1.021 05/07/2021 2044   PHURINE 6.0 05/07/2021 2044   GLUCOSEU NEGATIVE 05/07/2021 2044   HGBUR NEGATIVE 05/07/2021 2044   BILIRUBINUR NEGATIVE 05/07/2021 2044   [place] 05/07/2021 2044   PROTEINUR NEGATIVE 05/07/2021 2044   NITRITE NEGATIVE 05/07/2021 2044   LEUKOCYTESUR NEGATIVE 05/07/2021 2044   Sepsis Labs: Invalid input(s): PROCALCITONIN, [place]  Microbiology: Recent Results (from the past 240 hour(s))  Culture, blood (Routine x 2)     Status: None   Collection Time: 05/07/21  2:46 PM   Specimen: BLOOD  Result Value Ref Range Status   Specimen Description BLOOD PORTA CATH  Final   Special Requests   Final    BOTTLES DRAWN AEROBIC AND ANAEROBIC Blood Culture results may not be optimal due to an excessive volume of blood received in culture bottles   Culture   Final    NO GROWTH 5 DAYS Performed at [facility], [address] [place], [place] [address]    Report Status 05/12/2021 FINAL  Final  Culture, blood (Routine x 2)     Status: None   Collection Time: 05/07/21  2:46 PM   Specimen: BLOOD  Result Value Ref Range Status   Specimen Description BLOOD RIGHT ANTECUBITAL  Final   Special Requests   Final    BOTTLES DRAWN AEROBIC AND ANAEROBIC Blood Culture results may not be optimal due to an excessive volume of blood received in culture bottles   Culture   Final    NO GROWTH 5 DAYS Performed at [facility], [address], [place],  [address]    Report Status 05/12/2021 FINAL  Final  Resp Panel by RT-PCR (Flu A&B,  Covid) Nasopharyngeal Swab     Status: None   Collection Time: 05/07/21  5:57 PM   Specimen: Nasopharyngeal Swab; Nasopharyngeal(NP) swabs in vial transport medium  Result Value Ref Range Status   SARS Coronavirus 2 by RT PCR NEGATIVE NEGATIVE Final    Comment: (NOTE) SARS-CoV-2 target nucleic acids are NOT DETECTED.  The SARS-CoV-2 RNA is generally detectable in upper  respiratory specimens during the acute phase of infection. The lowest concentration of SARS-CoV-2 viral copies this assay can detect is 138 copies/mL. A negative result does not preclude SARS-Cov-2 infection and should not be used as the sole basis for treatment or other patient management decisions. A negative result may occur with  improper specimen collection/handling, submission of specimen other than nasopharyngeal swab, presence of viral mutation(s) within the areas targeted by this assay, and inadequate number of viral copies(<138 copies/mL). A negative result must be combined with clinical observations, patient history, and epidemiological information. The expected result is Negative.  Fact Sheet for Patients:  EntrepreneurPulse.com.au  Fact Sheet for Healthcare Providers:  IncredibleEmployment.be  This test is no t yet approved or cleared by the [place] FDA and  has been authorized for detection and/or diagnosis of SARS-CoV-2 by FDA under an Emergency Use Authorization (EUA). This EUA will remain  in effect (meaning this test can be used) for the duration of the COVID-19 declaration under Section 564(b)(1) of the Act, 21 U.S.C.section 360bbb-3(b)(1), unless the authorization is terminated  or revoked sooner.       Influenza A by PCR NEGATIVE NEGATIVE Final   Influenza B by PCR NEGATIVE NEGATIVE Final    Comment: (NOTE) The Xpert Xpress SARS-CoV-2/FLU/RSV plus assay is intended as an aid in the diagnosis of influenza from Nasopharyngeal swab specimens and should not be used as a sole basis for treatment. Nasal washings and aspirates are unacceptable for Xpert Xpress SARS-CoV-2/FLU/RSV testing.  Fact Sheet for Patients: EntrepreneurPulse.com.au  Fact Sheet for Healthcare Providers: IncredibleEmployment.be  This test is not yet approved or cleared by the [place] FDA and has been  authorized for detection and/or diagnosis of SARS-CoV-2 by FDA under an Emergency Use Authorization (EUA). This EUA will remain in effect (meaning this test can be used) for the duration of the COVID-19 declaration under Section 564(b)(1) of the Act, 21 U.S.C. section 360bbb-3(b)(1), unless the authorization is terminated or revoked.  Performed at [facility], [address] [place], [place] [address]   MRSA Next Gen by PCR, Nasal     Status: None   Collection Time: 05/09/21  1:16 PM   Specimen: Nasal Mucosa; Nasal Swab  Result Value Ref Range Status   MRSA by PCR Next Gen NOT DETECTED NOT DETECTED Final    Comment: (NOTE) The GeneXpert MRSA Assay (FDA approved for NASAL specimens only), is one component of a comprehensive MRSA colonization surveillance program. It is not intended to diagnose MRSA infection nor to guide or monitor treatment for MRSA infections. Test performance is not FDA approved in patients less than 6 years old. Performed at [facility], [address], [place], [place] [address]     Radiology Studies: No results found.     Val Riles, MD [facility]  If 7PM-7AM, please contact night-coverage [email or web address] 05/16/2021, 2:44 PM

## 2021-05-16 NOTE — Progress Notes (Addendum)
Physical Therapy Treatment Patient Details Name: Jimmy West MRN: [id number] DOB: 1951-05-09 Today's Date: 05/16/2021   History of Present Illness 70 y.o. male was brought to hosp to admit 10/18 for hypoxia, weakness, SOB and noted sepsis from PNA, as well as NSTEMI.  Troponins are high from demand ischemia, EF 40-45%, and has recent asp PNA per chart.  PMHx:  R lower lobe stage IV adenocarcinoma of the lung, chronic anemia, anxiety, depression, presence of Port-A-Cath, cad s/p PCI with stent placement in 2005, tobacco abuse quit in 2005    PT Comments    Pt feeling good today.  Remains on HFNC today.  Participated in exercises as described below.  Encouraged HEP on his own during the day.  To EOB with min guard.  Stood and is able to transfer to recliner and bedside with RW for balance.  Sats decrease to 79% once sitting and slowly return to baseline with time to low 90's.  Remained in recliner with needs met.   Recommendations for follow up therapy are one component of a multi-disciplinary discharge planning process, led by the attending physician.  Recommendations may be updated based on patient status, additional functional criteria and insurance authorization.  Follow Up Recommendations  Skilled nursing-short term rehab (<3 hours/day)     Assistance Recommended at Discharge Intermittent Supervision/Assistance  Equipment Recommendations  None recommended by PT    Recommendations for Other Services       Precautions / Restrictions Precautions Precautions: Fall Precaution Comments: monitor sats and HR Restrictions Weight Bearing Restrictions: No     Mobility  Bed Mobility Overal bed mobility: Needs Assistance Bed Mobility: Supine to Sit     Supine to sit: Min guard;Supervision          Transfers Overall transfer level: Needs assistance Equipment used: Rolling walker (2 wheels) Transfers: Sit to/from Stand Sit to Stand: Min guard                 Ambulation/Gait Ambulation/Gait assistance: Min guard Gait Distance (Feet): 3 Feet Assistive device: Rollator (4 wheels) Gait Pattern/deviations: Step-through pattern;Decreased stride length;Narrow base of support     General Gait Details: deferred   Stairs             Wheelchair Mobility    Modified Rankin (Stroke Patients Only)       Balance Overall balance assessment: Needs assistance Sitting-balance support: Feet supported Sitting balance-Leahy Scale: Good     Standing balance support: Bilateral upper extremity supported Standing balance-Leahy Scale: Fair                              Cognition Arousal/Alertness: Awake/alert Behavior During Therapy: WFL for tasks assessed/performed Overall Cognitive Status: Within Functional Limits for tasks assessed                                          Exercises Other Exercises Other Exercises: supine tx x 10    General Comments        Pertinent Vitals/Pain Pain Assessment: No/denies pain    Home Living                          Prior Function            PT Goals (current goals can now be found in the  care plan section) Progress towards PT goals: Progressing toward goals    Frequency    Min 2X/week      PT Plan Current plan remains appropriate    Co-evaluation              AM-PAC PT "6 Clicks" Mobility   Outcome Measure  Help needed turning from your back to your side while in a flat bed without using bedrails?: None Help needed moving from lying on your back to sitting on the side of a flat bed without using bedrails?: None Help needed moving to and from a bed to a chair (including a wheelchair)?: A Little Help needed standing up from a chair using your arms (e.g., wheelchair or bedside chair)?: A Little Help needed to walk in hospital room?: A Little Help needed climbing 3-5 steps with a railing? : A Lot 6 Click Score: 19    End of Session  Equipment Utilized During Treatment: Oxygen;Gait belt Activity Tolerance: Patient tolerated treatment well;Treatment limited secondary to medical complications (Comment) Patient left: in chair;with call bell/phone within reach;with chair alarm set Nurse Communication: Mobility status PT Visit Diagnosis: Muscle weakness (generalized) (M62.81);Difficulty in walking, not elsewhere classified (R26.2);Other (comment)     Time: 0301-4996 PT Time Calculation (min) (ACUTE ONLY): 17 min  Charges:  $Therapeutic Exercise: 8-22 mins                    Chesley Noon, PTA 05/16/21, 1:00 PM

## 2021-05-16 NOTE — Progress Notes (Addendum)
NAME:  Jimmy West, MRN:  [id number], DOB:  04-28-1951, LOS: 9 ADMISSION DATE:  05/07/2021, INITIAL CONSULTATION DATE: 05/08/2021 REFERRING MD: Dr. Billie Ruddy ; follow-up requested by Dr. Dwyane Dee CHIEF COMPLAINT: Shortness of breath, bilateral infiltrates.  Follow-up requested from initial consultation on 08 May 2021.  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 Patient confirms DNR/DNI code status today and was followed by palliative care team during previous admission. He has one living brother who lives in [place] and has not spoken to him in years. His preferred contact person is [name] (information in patient records). Patient lives alone and does not have assistance at home.    Patient presented to the ER via EMS last night 10/18 with chief complaint of shortness of breath. Patient reports that he has not felt well since his hospital discharge with profound weakness, too weak to eat, and worsening shortness of breath. He reports that he started wearing his home oxygen all of the time due to not feeling well which helped his pulmonary symptoms some. He states he was not using his prescribed inhaler because the pump was malfunctioning on the handheld device. He also describes wheezing and dry, nonproductive cough which have worsened in the last several days. He denies recent fever, chills, sweating, nausea, vomiting, swelling. He reports small volume loose stools occasionally which is not his routine bowel movements.    On arrival to the  ER, patient with noted oxygen saturations in the 80s on room air by EMS with blood pressure systolic in the 28U after a 200cc fluid bolus. Vital signs on arrival pulse 97, BP 103/74, RR 36, oxygen saturations 91% on 6L, afebrile. Patient noted to easily desaturate with activity, speaking, and any interruption to his oxygen flow such as when he was transitioned to 8L high flow nasal cannula with humidifier. Labs noted WBC 16.6, hgb 9.9, Creatinine 1.71, BNP 1105, troponin 3153 and 3506 on repeat. COVID and flu screening negative. Imaging: CXR findings of new left lower lobe airspace process suspicious for pneumonia with extensive right lower lobe density consistent with radiation change. D-dimer was elevated, and CTA of the chest was done which was negative for PE. Patient had sepsis bundle initiated and received 2 liters LR fluid resuscitation, empiric vancomycin and cefepime in the ER. He was started on a heparin continuous infusion for NSTEMI suspected demand ischemia on history of CAD with remote PCI.    PCCM was consulted to assess patient. He is currently being seen in the ER pending bed placement inpatient, tolerating 8 liters highflow oxygen cannula at oxygen saturations 92-93%; however, he has noted increased work of breathing with accessory muscles of breathing use, shortness of breath worsening with conversation with short shallow breaths between words. Patient is alert, oriented to self, location, situation and time. He denies any other recent events other than what is described above. He has never used a BiPAP or other NIPPV support device before but is agreeable to trial on BiPAP for a short duration if his respiratory condition worsens. He confirms DNI status as part of DNR and that he would not  want intubation to support his breathing.   Hospital course from 19 October till now: Patient has presented with acute on chronic respiratory failure with hypoxia with CT imaging with extensive differential  diagnosis.  He has been treated as "worsening pneumonia" with increasing FiO2 requirements in the process.  He is currently on steroids.  Had initial improvement on symptoms however, now has seemed to plateau.  We are asked to render opinion as to how long steroids will be necessary.  He is currently requiring high flow O2 at 60%.  Pertinent  Medical History   Stage IV adenocarcinoma of the lung Myocardial infarction in the past Remote history of PE on Eliquis CAD hx PCI 2005 Chronic anemia secondary to cancer treatment Port-a-cath right chest Recently discharged 04/28/21 after treatment of aspiration pneumonia  Significant Hospital Events: Including procedures, antibiotic start and stop dates in addition to other pertinent events   10/18: Brought by EMS to ER with cc: sob, hypoxic sats 80% on RA and hypotensive. CXR concerning for LUL pna, sepsis with NSTEMI demand ischemia on workup. Started on cefepime, vancomycin, heparin continuous infusion and assigned to hospitalist service pending bed placement.  10/19: Evaluated by PCCM significant pulm history with recurrent pneumonia ? Aspiration versus HCAP, sepsis, NSTEMI, hypotension. Stable sats on 8lpm highflow oxygen, with increased WOB and accessory muscle use.  10/26: PCCM asked to reevaluate for determination of length of steroid therapy 10/26: CT chest with no significant change 10/27: Still requiring high flow O2 60%, 40 L flow  Interim History / Subjective:  States that he "feels a little better" however mobility is still very limited.  Looks very debilitated.  Moderate conversational dyspnea.  Objective   Blood pressure 96/61, pulse 98, temperature 98 F (36.7 C), temperature source Oral, resp. rate 17, height 5\' 6"  (1.676 m), weight 64 kg, SpO2 91 %.    SpO2: 91 % O2 Flow Rate (L/min): 40 L/min FiO2 (%): 60 %  Intake/Output Summary (Last 24 hours) at 05/16/2021 1414 Last data filed at 05/16/2021 1400 Gross per 24 hour  Intake  1560 ml  Output 3660 ml  Net -2100 ml    Filed Weights   05/07/21 1439 05/10/21 0901 05/10/21 1537  Weight: 58.4 kg 63.2 kg 64 kg    Examination: GENERAL: Debilitated appearing gentleman, sitting up in chair, on high flow O2 via nasal cannula, no conversational dyspnea noted today.  More interactive. HEAD: Normocephalic, atraumatic.  EYES: Pupils equal, round, reactive to light.  No scleral icterus.  MOUTH: Oral mucosa moist. NECK: Supple. No thyromegaly. Trachea midline. No JVD.  No adenopathy. PULMONARY: Good air entry bilaterally.  Coarse throughout, crackles at bases.  No wheezes. CARDIOVASCULAR: S1 and S2. Regular rate and rhythm.  No rubs, murmurs or gallops heard. ABDOMEN: Scaphoid, otherwise benign. MUSCULOSKELETAL: No joint deformity, no clubbing, no edema.  Diffuse muscle wasting. NEUROLOGIC: No overt focal deficit, gait not tested.   SKIN: Intact,warm,dry. PSYCH: Clear mentation today.  Mood and behavior normal.  Resolved Hospital Problem list   N/A  Assessment & Plan:  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 DDX: Pemetrexed/Pembrolizumab toxicity, lymphangitic spread of CA, opportunistic infection (nonbacterial) - Oxygen supplementation to maintain O2 sats > 88% - Heated high flow for now taper as tolerated still requiring 60% with 40 L of flow - Continue duonebs q4h - Continue prednisone at 40 mg daily - PJP  prophylaxis Bactrim DS Monday Wednesday Friday - Will need taper of steroids over 4 to 6-week period of time - Procalcitonin's have been negative, check KL6, inflammatory markers, LDH, Fungitell, immunoglobulins>> pending - Repeated CT chest, persistent interstitial and airspace process with no significant change - Mild improvement clinically   Pneumonia ruled out Procalcitonin is normal Persistent  findings on CT with differential as above -No need for antibiotics at present -Aspiration precautions -Leukocytosis likely due to steroids   ACUTE KIDNEY INJURY/Renal Failure - Avoid nephrotoxic agents as able - Follow urine output, BMP - Monitor closely on PJP prophylaxis - Ensure adequate renal perfusion, optimize oxygenation  Anemia, chronic secondary to cancer treatment - stable - Monitor routine CBC   Elevated d-dimer  History PE on Eliquis - CTA Chest 10/18 negative for PE - Continue Eliquis   Radiologic studies  Representative image from CT angio chest obtained 07 May 2021:  Interstitial and airspace process in both lungs  Representative image from CT chest obtained today (26 October):   Persistent interstitial process   Labs   CBC: Recent Labs  Lab 05/10/21 0517 05/11/21 0555 05/12/21 0530 05/15/21 0445 05/16/21 0603  WBC 14.0* 14.5* 15.3* 14.9* 17.5*  NEUTROABS  --  14.0*  --   --   --   HGB 7.6* 9.3* 9.8* 10.3* 11.0*  HCT 24.1* 28.6* 30.0* 31.6* 33.6*  MCV 95.6 95.0 96.5 96.9 96.3  PLT 228 227 222 176 196     Basic Metabolic Panel: Recent Labs  Lab 05/11/21 0555 05/12/21 0530 05/13/21 0515 05/14/21 0706 05/15/21 0445 05/16/21 0603  NA 142 142 141 143 140 139  K 4.3 4.6 4.7 4.0 4.1 3.8  CL 105 105 103 104 102 99  CO2 28 29 29  32 34* 34*  GLUCOSE 131* 146* 120* 96 109* 129*  BUN 42* 48* 41* 33* 33* 41*  CREATININE 1.39* 1.32* 1.22 1.13 1.15 1.41*  CALCIUM 8.9 9.0 9.1 9.0 8.5* 8.7*  MG 2.0 1.9 1.6* 2.1 2.1 1.9  PHOS 4.8* 2.6 2.4*  --  3.6 4.5    GFR: Estimated Creatinine Clearance: 44 mL/min (A) (by C-G formula based on SCr of 1.41 mg/dL (H)). Recent Labs  Lab 05/11/21 0555 05/12/21 0530 05/15/21 0445 05/16/21 0603  WBC 14.5* 15.3* 14.9* 17.5*     Liver Function Tests: Recent Labs  Lab 05/11/21 0555 05/12/21 0530 05/13/21 0515  AST 20  --   --   ALT 19  --   --   ALKPHOS 74  --   --   BILITOT 0.8  --   --   PROT  5.5*  --   --   ALBUMIN 2.3* 2.4* 2.5*    No results for input(s): LIPASE, AMYLASE in the last 168 hours. No results for input(s): AMMONIA in the last 168 hours.  ABG    Component Value Date/Time   PHART 7.51 (H) 05/14/2021 1459   PCO2ART 38 05/14/2021 1459   PO2ART 51 (L) 05/14/2021 1459   HCO3 30.3 (H) 05/14/2021 1459   O2SAT 89.4 05/14/2021 1459      Coagulation Profile: No results for input(s): INR, PROTIME in the last 168 hours.  Cardiac Enzymes: No results for input(s): CKTOTAL, CKMB, CKMBINDEX, TROPONINI in the last 168 hours.  HbA1C: No results found for: HGBA1C  CBG: Recent Labs  Lab 05/12/21 1153 05/12/21 1621 05/12/21 2022  GLUCAP 170* 177* 132*     Review of Systems:   A 10 point review of systems was performed  and it is as noted above otherwise negative.  Allergies No Known Allergies   Scheduled Meds:  apixaban  5 mg Oral BID   atorvastatin  40 mg Oral Daily   budesonide (PULMICORT) nebulizer solution  0.5 mg Nebulization BID   Chlorhexidine Gluconate Cloth  6 each Topical Daily   feeding supplement  237 mL Oral TID BM   FLUoxetine  40 mg Oral Daily   guaiFENesin  1,200 mg Oral BID   ipratropium-albuterol  3 mL Nebulization TID   OLANZapine  10 mg Oral QHS   predniSONE  40 mg Oral Q breakfast   [START ON 05/17/2021] sulfamethoxazole-trimethoprim  1 tablet Oral Once per day on Mon Wed Fri   Continuous Infusions:  sodium chloride 10 mL/hr (05/13/21 1620)   PRN Meds:.sodium chloride, acetaminophen, ALPRAZolam, docusate sodium, guaiFENesin-dextromethorphan, ondansetron (ZOFRAN) IV, ondansetron, oxyCODONE, polyethylene glycol, sodium chloride   Level 2 follow-up    Discussion: Patient appears to have more of an interstitial process that is failing to improve this is either steroid refractory immune checkpoint inhibitor (ICI) induced pneumonitis, lymphangitic spread of tumor, or nonbacterial opportunistic infection.  Pending studies ordered  yesterday, KL- 6 has been shown useful in determining ILD from other modalities, however this may take a few weeks to come back.  For now continue prednisone at 40 mg daily, he will need a 4 to 6-week slow taper.  Start PJP prophylaxis. With lack of improvement thus far, his prognosis is exceedingly guarded be it from [place] induced pneumonitis or from other etiologies as above.  However, it is encouraging that he is in better spirits today.   Renold Don, MD Advanced Bronchoscopy PCCM [facility]    *This note was dictated using voice recognition software/Dragon.  Despite best efforts to proofread, errors can occur which can change the meaning.  Any change was purely unintentional.

## 2021-05-17 DIAGNOSIS — J9621 Acute and chronic respiratory failure with hypoxia: Secondary | ICD-10-CM | POA: Diagnosis not present

## 2021-05-17 DIAGNOSIS — D6481 Anemia due to antineoplastic chemotherapy: Secondary | ICD-10-CM | POA: Diagnosis not present

## 2021-05-17 DIAGNOSIS — Z79899 Other long term (current) drug therapy: Secondary | ICD-10-CM

## 2021-05-17 DIAGNOSIS — C3431 Malignant neoplasm of lower lobe, right bronchus or lung: Secondary | ICD-10-CM | POA: Diagnosis not present

## 2021-05-17 DIAGNOSIS — J9601 Acute respiratory failure with hypoxia: Secondary | ICD-10-CM | POA: Diagnosis not present

## 2021-05-17 DIAGNOSIS — Z9221 Personal history of antineoplastic chemotherapy: Secondary | ICD-10-CM

## 2021-05-17 DIAGNOSIS — Z86711 Personal history of pulmonary embolism: Secondary | ICD-10-CM

## 2021-05-17 DIAGNOSIS — R918 Other nonspecific abnormal finding of lung field: Secondary | ICD-10-CM | POA: Diagnosis not present

## 2021-05-17 DIAGNOSIS — N183 Chronic kidney disease, stage 3 unspecified: Secondary | ICD-10-CM

## 2021-05-17 DIAGNOSIS — Z7901 Long term (current) use of anticoagulants: Secondary | ICD-10-CM

## 2021-05-17 LAB — CBC
HCT: 31.4 % — ABNORMAL LOW (ref 39.0–52.0)
Hemoglobin: 10.2 g/dL — ABNORMAL LOW (ref 13.0–17.0)
MCH: 30.5 pg (ref 26.0–34.0)
MCHC: 32.5 g/dL (ref 30.0–36.0)
MCV: 94 fL (ref 80.0–100.0)
Platelets: 186 10*3/uL (ref 150–400)
RBC: 3.34 MIL/uL — ABNORMAL LOW (ref 4.22–5.81)
RDW: 18.8 % — ABNORMAL HIGH (ref 11.5–15.5)
WBC: 14.9 10*3/uL — ABNORMAL HIGH (ref 4.0–10.5)
nRBC: 0 % (ref 0.0–0.2)

## 2021-05-17 LAB — BASIC METABOLIC PANEL
Anion gap: 6 (ref 5–15)
BUN: 46 mg/dL — ABNORMAL HIGH (ref 8–23)
CO2: 35 mmol/L — ABNORMAL HIGH (ref 22–32)
Calcium: 8.5 mg/dL — ABNORMAL LOW (ref 8.9–10.3)
Chloride: 94 mmol/L — ABNORMAL LOW (ref 98–111)
Creatinine, Ser: 1.4 mg/dL — ABNORMAL HIGH (ref 0.61–1.24)
GFR, Estimated: 54 mL/min — ABNORMAL LOW (ref >60)
Glucose, Bld: 154 mg/dL — ABNORMAL HIGH (ref 70–99)
Potassium: 3.9 mmol/L (ref 3.5–5.1)
Sodium: 135 mmol/L (ref 135–145)

## 2021-05-17 LAB — PHOSPHORUS: Phosphorus: 4.1 mg/dL (ref 2.5–4.6)

## 2021-05-17 LAB — MAGNESIUM: Magnesium: 2 mg/dL (ref 1.7–2.4)

## 2021-05-17 NOTE — Progress Notes (Signed)
Jimmy West   DOB:1951-05-12   AQ#:[id number]    Subjective: Patient states to have uneventful night.  Denies any worsening shortness of breath or cough.  No hemoptysis.  Continues to be on high flow nasal oxygen.  Objective:  Vitals:   05/17/21 0819 05/17/21 0830  BP:  105/64  Pulse:  96  Resp:  17  Temp:  97.9 F (36.6 C)  SpO2: 95% 95%     Intake/Output Summary (Last 24 hours) at 05/17/2021 1012 Last data filed at 05/17/2021 0831 Gross per 24 hour  Intake 600 ml  Output 2585 ml  Net -1985 ml    Physical Exam Vitals and nursing note reviewed.  Constitutional:      Comments: Cachectic weak appearing Caucasian male patient.   HENT:     Head: Normocephalic and atraumatic.     Mouth/Throat:     Pharynx: Oropharynx is clear.  Eyes:     Extraocular Movements: Extraocular movements intact.     Pupils: Pupils are equal, round, and reactive to light.  Cardiovascular:     Rate and Rhythm: Normal rate and regular rhythm.  Pulmonary:     Comments: Decreased breath sounds bilaterally.  Bilateral diffuse crackles. Abdominal:     Palpations: Abdomen is soft.  Musculoskeletal:        General: Normal range of motion.     Cervical back: Normal range of motion.  Skin:    General: Skin is warm.  Neurological:     General: No focal deficit present.     Mental Status: He is alert and oriented to person, place, and time.  Psychiatric:        Behavior: Behavior normal.        Judgment: Judgment normal.     Labs:  Lab Results  Component Value Date   WBC 14.9 (H) 05/17/2021   HGB 10.2 (L) 05/17/2021   HCT 31.4 (L) 05/17/2021   MCV 94.0 05/17/2021   PLT 186 05/17/2021   NEUTROABS 14.0 (H) 05/11/2021    Lab Results  Component Value Date   NA 135 05/17/2021   K 3.9 05/17/2021   CL 94 (L) 05/17/2021   CO2 35 (H) 05/17/2021    Studies:  CT CHEST WO CONTRAST  Result Date: 05/15/2021 CLINICAL DATA:  Unresolving Pulmonary infiltrates, R/O lymphangitic spread, pt  has AdenoCA lung EXAM: CT CHEST WITHOUT CONTRAST TECHNIQUE: Multidetector CT imaging of the chest was performed following the standard protocol without IV contrast. COMPARISON:  Multiple priors FINDINGS: Cardiovascular: Normal heart size. No pericardial effusion. Thoracic aorta is normal in caliber with scattered atherosclerotic calcifications. The proximal pulmonary arteries are normal in caliber. There is a right-sided chest port with central venous catheter entering the right internal jugular vein and tip terminating near the superior cavoatrial junction. Mediastinum/Nodes: No enlarged mediastinal, hilar, or axillary lymph nodes. The thyroid gland appears normal. Lungs/Pleura: Right pleural thickening without significant effusion appreciated. No pneumothorax. Extensive centrilobular emphysematous changes in the lungs. Again seen is the right lower lobe mass which measures up to 2.6 cm, essentially unchanged since previous exam. Again seen are similar distribution and severity of bilateral pulmonary opacities, which can be described as more coarse interstitial opacities with ground-glass opacity in the inferior aspect of both the left upper and lower lobes, and more confluent peribronchiolar consolidation in the right lower lobe. Musculoskeletal: No aggressive osseous lesions. Unchanged severe compression fracture the T4 vertebral body. Upper abdomen: The visualized upper abdomen is unremarkable. IMPRESSION: Essentially stable appearance of the  lungs including right lower lobe mass and bilateral interstitial and peribronchialar consolidative opacities as described in a background of extensive emphysematous changes. Although nonspecific, findings are favored to represent multifocal infectious process versus inflammatory pneumonitis in the context of immunotherapy administration, and less likely lymphangitic carcinomatosis. Electronically Signed   By: Albin Felling M.D.   On: 05/15/2021 15:00    Primary cancer of  right lower lobe of lung [facility]) #70 year old male patient history of COPD/chronic respiratory failure-metastatic lung cancer currently on immunotherapy is currently admitted hospital for worsening respiratory distress/diagnosed with pneumonia.  #Right lower lobe lung cancer-adenocarcinoma currently on immunotherapy/Keytruda  #Acute on chronic respiratory failure / bilateral lower lobe pneumonitis-immunotherapy versus lymphangitic spread versus infection.  Currently on prednisone 60 mg a day.  I reviewed the recent CT scan done on 10/27-overall stable immunotherapy versus pneumonitis.  Appreciate input from pulmonary.  Discussed with Dr. Patsey Berthold regarding consideration of infliximab if pulmonary status worsens/refractory to steroids-if negative for opportunistic infections.  Cammie Sickle, MD 05/17/2021  10:12 AM

## 2021-05-17 NOTE — Progress Notes (Signed)
PROGRESS NOTE  Jimmy West QJJ:[id number] DOB: 1951-01-03   PCP: Pcp, No  Patient is from: Home.  Uses rolling walker at baseline.  DOA: 05/07/2021 LOS: 10  Chief complaints:  Chief Complaint  Patient presents with   Shortness of Breath     Brief Narrative / Interim history: 70 year old M with PMH of stage IV RLL lung cancer, cancer pain, CAD/stent in 2005, chronic respiratory failure with hypoxia on 2 L, anemia of chronic disease, anxiety, depression and debility presenting with generalized weakness, shortness of breath and dry cough, and admitted for acute on chronic hypoxic respiratory failure in the setting of LLL pneumonia and non-STEMI.  Started on broad-spectrum antibiotics, steroid and IV heparin.  Cardiology consulted recommended medical management and [place] outpatient.  CTA chest negative for PE.  Continues to require up to 6 L by [place] at rest. 10/25 patient became hypoxic and required directed oxygen, started high flow nasal cannula, FiO2 54%, inpatient setting around 90s  Subjective: No significant overnight events, patient feels improvement in the breathing, resting comfortably on high flow nasal cannula.  Denied any worsening of symptoms, no chest palpitations.    Objective: Vitals:   05/17/21 0830 05/17/21 1130 05/17/21 1359 05/17/21 1507  BP: 105/64 96/64  101/75  Pulse: 96 95  (!) 107  Resp: 17 17  17   Temp: 97.9 F (36.6 C) 98.1 F (36.7 C)  97.9 F (36.6 C)  TempSrc:      SpO2: 95% 90% 91% 94%  Weight:      Height:        Intake/Output Summary (Last 24 hours) at 05/17/2021 1552 Last data filed at 05/17/2021 1000 Gross per 24 hour  Intake 480 ml  Output 1585 ml  Net -1105 ml   Filed Weights   05/07/21 1439 05/10/21 0901 05/10/21 1537  Weight: 58.4 kg 63.2 kg 64 kg    Examination:  GENERAL: Frail looking elderly male.  No apparent distress. HEENT: MMM.  RESP: 90% on 6 L.  Hardly speaks in full sentence.  Crackles over LLL.   Diminished aeration.  Few scatterec rhonchi CVS:  RRR. Heart sounds normal.  ABD/GI/GU: BS+. Abd soft, NTND.  MSK/EXT:  Moves extremities. No apparent deformity. No edema.  SKIN: no apparent skin lesion or wound. Right subclavian portacath NEURO: Awake and alert. Oriented appropriately.  No apparent focal neuro deficit. PSYCH: Calm. Normal affect.   Procedures:  None  Microbiology summarized: [id number] and influenza PCR nonreactive. MRSA PCR screen negative. Blood cultures NGTD.  Assessment & Plan: Acute on chronic respiratory failure with hypoxia due to LLL pneumonia with superimposed stage IV RLL lung cancer-on 2 L at baseline.  Currently requiring 6 L.  CTA chest negative for PE but concern for LLL pneumonia with RLL scarring from known lung cancer.  Pro-Cal elevated to 1.38.  Blood cultures negative. On cefepime since 10/18 making today day 7. S/p vancomycin - s/p Cefepime completed 7 days. S/p azithromycin 500 x 3 doses  - check CXR to eval for parapneumonic effusion which would require extended course abx - stop solumedrol, start oral prednisone, plan to wean -Wean oxygen as able. -IS/OOB/PT/OT - palliative consulted - Dr. B of oncology aware of the patient 10/25 oxygen requirement increased, patient is on HFNC 54% FiO2, patient is DNR/DNI, we will continue to monitor and start BiPAP if needed. ABG consistent with hypoxic respiratory failure CXR: Persistent interstitial and airspace opacities at the lung bases with slight interval improvement. Remains concerning for multifocal pneumonia or  pneumonitis. Known pulmonary mass not well evaluated S/p Lasix 20 mg IV twice daily,d/c;d on 10/27 due to slightly elevated creatinine Continue fluid restriction 1.5 L/day 10/26 discussed with the pulmonologist,  CT chest: stable RLL mass and bilateral interstitial and peribronchialar consolidative opacities,  extensive emphysematous changes, multifocal infectious process vs inflammatory   Pneumonitis could be due to immunotherapy administration, and less likely lymphangitic carcinomatosis. 10/27 as per pulmonologist patient will need prednisone 40 mg daily and will need to 4 to 6 weeks slow taper, started PJP prophylaxis    Severe sepsis due to pneumonia:tachycardia, tachypnea, leukocytosis, AKI with pneumonia POA. Sepsis physiology resolved -Managementas above.   NSTEMI/history of CAD/stent in 2005: Troponin 3153 -> 3506 -> 2927 -> 2894.  EKG with T wave changes in lateral leads but no dynamic changes.  TTE with LVEF of 55 to 60%, G1-DD and no RWMA chest pain-free.  -Cardiology following -Completed 48 hours of IV heparin and transition back to home Eliquis. -Medical management due to poor candidacy for heart cath in the setting of respiratory failure -Cardiology recommends outpatient follow-up when stable from respiratory standpoint.  Chronic diastolic CHF: TTE as above.  Appears euvolemic on exam.  Does not seem to be on diuretics at home.  About 1.6 L UOP/24 hours. -Monitor fluid status   AKI/azotemia on CKD-3A: Improved.  Does not seem to be on nephrotoxic meds.  Could be hemodynamically mediated Recent Labs    05/08/21 0552 05/09/21 0701 05/10/21 0517 05/11/21 0555 05/12/21 0530 05/13/21 0515 05/14/21 0706 05/15/21 0445 05/16/21 0603 05/17/21 0548  BUN 22 30* 36* 42* 48* 41* 33* 33* 41* 46*  CREATININE 1.50* 1.44* 1.74* 1.39* 1.32* 1.22 1.13 1.15 1.41* 1.40*  -Continue monitoring -Avoid nephrotoxic meds.  Hypotension: Resolved.   Stage IV lung cancer s/p radiation and chemotherapy previously.   -Outpatient follow-up - oncology following    Anemia of chronic disease: H&H stable after 1 unit Recent Labs    04/28/21 0541 05/07/21 1446 05/08/21 0552 05/09/21 0701 05/10/21 0517 05/11/21 0555 05/12/21 0530 05/15/21 0445 05/16/21 0603 05/17/21 0548  HGB 9.7* 9.9* 9.4* 8.1* 7.6* 9.3* 9.8* 10.3* 11.0* 10.2*  -Continue monitoring   Chronic pain  syndrome: -Continue oxycodone and bowel regimen as needed.   Depression and Anxiety: Stable -Continue with Fluoxetine 40 g p.o. daily as well as Olanzapine 10 mg p.o. nightly   Hx of PE: CTA chest negative for PE. -Continue home Eliquis  Physical deconditioning-uses rolling walker at baseline. -Therapy recommends SNF  Leukocytosis/bandemia-likely from steroid.  Goal of care counseling-patient with comorbidity as above.  Very frail and deconditioned.  Poor long-term prognosis.  Appropriately DNR/DNI.  A candidate for hospice -Palliative medicine consulted.  Body mass index is 22.77 kg/m.        DVT prophylaxis:   apixaban (ELIQUIS) tablet 5 mg  Code Status: DNR/DNI Family Communication: Patient and/or RN. Available if any question.  Level of care: Progressive Cardiac Status is: Inpatient  Remains inpatient appropriate because: Due to significant oxygen requirement, work of breathing and need for IV medications and close monitoring     Consultants:  Cardiology Pulmonology Palliative medicine   Sch Meds:  Scheduled Meds:  apixaban  5 mg Oral BID   atorvastatin  40 mg Oral Daily   budesonide (PULMICORT) nebulizer solution  0.5 mg Nebulization BID   Chlorhexidine Gluconate Cloth  6 each Topical Daily   feeding supplement  237 mL Oral TID BM   FLUoxetine  40 mg Oral Daily   guaiFENesin  1,200 mg Oral BID   ipratropium-albuterol  3 mL Nebulization TID   OLANZapine  10 mg Oral QHS   predniSONE  40 mg Oral Q breakfast   sulfamethoxazole-trimethoprim  1 tablet Oral Once per day on Mon Wed Fri   Continuous Infusions:  sodium chloride 10 mL/hr (05/13/21 1620)   PRN Meds:.sodium chloride, acetaminophen, ALPRAZolam, docusate sodium, guaiFENesin-dextromethorphan, ondansetron (ZOFRAN) IV, ondansetron, oxyCODONE, polyethylene glycol, sodium chloride  Antimicrobials: Anti-infectives (From admission, onward)    Start     Dose/Rate Route Frequency Ordered Stop   05/17/21  0900  sulfamethoxazole-trimethoprim (BACTRIM DS) 800-160 MG per tablet 1 tablet        1 tablet Oral Once per day on Mon Wed Fri 05/16/21 0935     05/13/21 1030  ceFEPIme (MAXIPIME) 2 g in sodium chloride 0.9 % 100 mL IVPB        2 g 200 mL/hr over 30 Minutes Intravenous Every 12 hours 05/13/21 0952 05/13/21 1959   05/11/21 1630  azithromycin (ZITHROMAX) 500 mg in sodium chloride 0.9 % 250 mL IVPB        500 mg 250 mL/hr over 60 Minutes Intravenous Every 24 hours 05/11/21 1537 05/13/21 1721   05/08/21 1800  vancomycin (VANCOREADY) IVPB 750 mg/150 mL  Status:  Discontinued        750 mg 150 mL/hr over 60 Minutes Intravenous Every 24 hours 05/08/21 1135 05/09/21 1448   05/08/21 0800  ceFEPIme (MAXIPIME) 2 g in sodium chloride 0.9 % 100 mL IVPB  Status:  Discontinued        2 g 200 mL/hr over 30 Minutes Intravenous Every 12 hours 05/07/21 1929 05/13/21 0952   05/07/21 2200  ceFEPIme (MAXIPIME) 2 g in sodium chloride 0.9 % 100 mL IVPB  Status:  Discontinued        2 g 200 mL/hr over 30 Minutes Intravenous Every 12 hours 05/07/21 1927 05/07/21 1929   05/07/21 1930  vancomycin (VANCOREADY) IVPB 500 mg/100 mL        500 mg 100 mL/hr over 60 Minutes Intravenous  Once 05/07/21 1927 05/07/21 2144   05/07/21 1927  vancomycin variable dose per unstable renal function (pharmacist dosing)  Status:  Discontinued         Does not apply See admin instructions 05/07/21 1927 05/09/21 1448   05/07/21 1600  vancomycin (VANCOCIN) IVPB 1000 mg/200 mL premix        1,000 mg 200 mL/hr over 60 Minutes Intravenous  Once 05/07/21 1552 05/07/21 1801   05/07/21 1600  ceFEPIme (MAXIPIME) 2 g in sodium chloride 0.9 % 100 mL IVPB        2 g 200 mL/hr over 30 Minutes Intravenous  Once 05/07/21 1552 05/07/21 1700        I have personally reviewed the following labs and images: CBC: Recent Labs  Lab 05/11/21 0555 05/12/21 0530 05/15/21 0445 05/16/21 0603 05/17/21 0548  WBC 14.5* 15.3* 14.9* 17.5* 14.9*   NEUTROABS 14.0*  --   --   --   --   HGB 9.3* 9.8* 10.3* 11.0* 10.2*  HCT 28.6* 30.0* 31.6* 33.6* 31.4*  MCV 95.0 96.5 96.9 96.3 94.0  PLT 227 222 176 196 186   BMP &GFR Recent Labs  Lab 05/12/21 0530 05/13/21 0515 05/14/21 0706 05/15/21 0445 05/16/21 0603 05/17/21 0548  NA 142 141 143 140 139 135  K 4.6 4.7 4.0 4.1 3.8 3.9  CL 105 103 104 102 99 94*  CO2 29 29 32 34*  34* 35*  GLUCOSE 146* 120* 96 109* 129* 154*  BUN 48* 41* 33* 33* 41* 46*  CREATININE 1.32* 1.22 1.13 1.15 1.41* 1.40*  CALCIUM 9.0 9.1 9.0 8.5* 8.7* 8.5*  MG 1.9 1.6* 2.1 2.1 1.9 2.0  PHOS 2.6 2.4*  --  3.6 4.5 4.1   Estimated Creatinine Clearance: 44.3 mL/min (A) (by C-G formula based on SCr of 1.4 mg/dL (H)). Liver & Pancreas: Recent Labs  Lab 05/11/21 0555 05/12/21 0530 05/13/21 0515  AST 20  --   --   ALT 19  --   --   ALKPHOS 74  --   --   BILITOT 0.8  --   --   PROT 5.5*  --   --   ALBUMIN 2.3* 2.4* 2.5*   No results for input(s): LIPASE, AMYLASE in the last 168 hours. No results for input(s): AMMONIA in the last 168 hours. Diabetic: No results for input(s): HGBA1C in the last 72 hours. Recent Labs  Lab 05/12/21 1153 05/12/21 1621 05/12/21 2022  GLUCAP 170* 177* 132*   Cardiac Enzymes: No results for input(s): CKTOTAL, CKMB, CKMBINDEX, TROPONINI in the last 168 hours. No results for input(s): PROBNP in the last 8760 hours. Coagulation Profile: No results for input(s): INR, PROTIME in the last 168 hours.  Thyroid Function Tests: No results for input(s): TSH, T4TOTAL, FREET4, T3FREE, THYROIDAB in the last 72 hours. Lipid Profile: No results for input(s): CHOL, HDL, LDLCALC, TRIG, CHOLHDL, LDLDIRECT in the last 72 hours.  Anemia Panel: No results for input(s): VITAMINB12, FOLATE, FERRITIN, TIBC, IRON, RETICCTPCT in the last 72 hours.  Urine analysis:    Component Value Date/Time   COLORURINE YELLOW (A) 05/07/2021 2044   APPEARANCEUR CLEAR (A) 05/07/2021 2044   LABSPEC 1.021  05/07/2021 2044   PHURINE 6.0 05/07/2021 2044   GLUCOSEU NEGATIVE 05/07/2021 2044   HGBUR NEGATIVE 05/07/2021 2044   BILIRUBINUR NEGATIVE 05/07/2021 2044   [place] 05/07/2021 2044   PROTEINUR NEGATIVE 05/07/2021 2044   NITRITE NEGATIVE 05/07/2021 2044   LEUKOCYTESUR NEGATIVE 05/07/2021 2044   Sepsis Labs: Invalid input(s): PROCALCITONIN, [place]  Microbiology: Recent Results (from the past 240 hour(s))  Resp Panel by RT-PCR (Flu A&B, Covid) Nasopharyngeal Swab     Status: None   Collection Time: 05/07/21  5:57 PM   Specimen: Nasopharyngeal Swab; Nasopharyngeal(NP) swabs in vial transport medium  Result Value Ref Range Status   SARS Coronavirus 2 by RT PCR NEGATIVE NEGATIVE Final    Comment: (NOTE) SARS-CoV-2 target nucleic acids are NOT DETECTED.  The SARS-CoV-2 RNA is generally detectable in upper respiratory specimens during the acute phase of infection. The lowest concentration of SARS-CoV-2 viral copies this assay can detect is 138 copies/mL. A negative result does not preclude SARS-Cov-2 infection and should not be used as the sole basis for treatment or other patient management decisions. A negative result may occur with  improper specimen collection/handling, submission of specimen other than nasopharyngeal swab, presence of viral mutation(s) within the areas targeted by this assay, and inadequate number of viral copies(<138 copies/mL). A negative result must be combined with clinical observations, patient history, and epidemiological information. The expected result is Negative.  Fact Sheet for Patients:  EntrepreneurPulse.com.au  Fact Sheet for Healthcare Providers:  IncredibleEmployment.be  This test is no t yet approved or cleared by the [place] FDA and  has been authorized for detection and/or diagnosis of SARS-CoV-2 by FDA under an Emergency Use Authorization (EUA). This EUA will remain  in effect  (  meaning this test can be used) for the duration of the COVID-19 declaration under Section 564(b)(1) of the Act, 21 U.S.C.section 360bbb-3(b)(1), unless the authorization is terminated  or revoked sooner.       Influenza A by PCR NEGATIVE NEGATIVE Final   Influenza B by PCR NEGATIVE NEGATIVE Final    Comment: (NOTE) The Xpert Xpress SARS-CoV-2/FLU/RSV plus assay is intended as an aid in the diagnosis of influenza from Nasopharyngeal swab specimens and should not be used as a sole basis for treatment. Nasal washings and aspirates are unacceptable for Xpert Xpress SARS-CoV-2/FLU/RSV testing.  Fact Sheet for Patients: EntrepreneurPulse.com.au  Fact Sheet for Healthcare Providers: IncredibleEmployment.be  This test is not yet approved or cleared by the [place] FDA and has been authorized for detection and/or diagnosis of SARS-CoV-2 by FDA under an Emergency Use Authorization (EUA). This EUA will remain in effect (meaning this test can be used) for the duration of the COVID-19 declaration under Section 564(b)(1) of the Act, 21 U.S.C. section 360bbb-3(b)(1), unless the authorization is terminated or revoked.  Performed at [facility], [address] [place], [place] [address]   MRSA Next Gen by PCR, Nasal     Status: None   Collection Time: 05/09/21  1:16 PM   Specimen: Nasal Mucosa; Nasal Swab  Result Value Ref Range Status   MRSA by PCR Next Gen NOT DETECTED NOT DETECTED Final    Comment: (NOTE) The GeneXpert MRSA Assay (FDA approved for NASAL specimens only), is one component of a comprehensive MRSA colonization surveillance program. It is not intended to diagnose MRSA infection nor to guide or monitor treatment for MRSA infections. Test performance is not FDA approved in patients less than 7 years old. Performed at [facility], [address] [place], [place] [address]     Radiology Studies: No results  found.     Val Riles, MD [facility]  If 7PM-7AM, please contact night-coverage [email or web address] 05/17/2021, 3:52 PM

## 2021-05-17 NOTE — Progress Notes (Signed)
NAME:  Jimmy West, MRN:  [id number], DOB:  [birth year], LOS: 10 ADMISSION DATE:  05/07/2021, INITIAL CONSULTATION DATE: 05/08/2021 REFERRING MD: Dr. Billie Ruddy ; follow-up requested by Dr. Dwyane Dee CHIEF COMPLAINT: Shortness of breath, bilateral infiltrates.  Follow-up requested from initial consultation on 08 May 2021.  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 Patient confirms DNR/DNI code status today and was followed by palliative care team during previous admission. He has one living brother who lives in [place] and has not spoken to him in years. His preferred contact person is [name] (information in patient records). Patient lives alone and does not have assistance at home.    Patient presented to the ER via EMS last night 10/18 with chief complaint of shortness of breath. Patient reports that he has not felt well since his hospital discharge with profound weakness, too weak to eat, and worsening shortness of breath. He reports that he started wearing his home oxygen all of the time due to not feeling well which helped his pulmonary symptoms some. He states he was not using his prescribed inhaler because the pump was malfunctioning on the handheld device. He also describes wheezing and dry, nonproductive cough which have worsened in the last several days. He denies recent fever, chills, sweating, nausea, vomiting, swelling. He reports small volume loose stools occasionally which is not his routine bowel movements.    On arrival to the  ER, patient with noted oxygen saturations in the 80s on room air by EMS with blood pressure systolic in the 75I after a 200cc fluid bolus. Vital signs on arrival pulse 97, BP 103/74, RR 36, oxygen saturations 91% on 6L, afebrile. Patient noted to easily desaturate with activity, speaking, and any interruption to his oxygen flow such as when he was transitioned to 8L high flow nasal cannula with humidifier. Labs noted WBC 16.6, hgb 9.9, Creatinine 1.71, BNP 1105, troponin 3153 and 3506 on repeat. COVID and flu screening negative. Imaging: CXR findings of new left lower lobe airspace process suspicious for pneumonia with extensive right lower lobe density consistent with radiation change. D-dimer was elevated, and CTA of the chest was done which was negative for PE. Patient had sepsis bundle initiated and received 2 liters LR fluid resuscitation, empiric vancomycin and cefepime in the ER. He was started on a heparin continuous infusion for NSTEMI suspected demand ischemia on history of CAD with remote PCI.    PCCM was consulted to assess patient. He is currently being seen in the ER pending bed placement inpatient, tolerating 8 liters highflow oxygen cannula at oxygen saturations 92-93%; however, he has noted increased work of breathing with accessory muscles of breathing use, shortness of breath worsening with conversation with short shallow breaths between words. Patient is alert, oriented to self, location, situation and time. He denies any other recent events other than what is described above. He has never used a BiPAP or other NIPPV support device before but is agreeable to trial on BiPAP for a short duration if his respiratory condition worsens. He confirms DNI status as part of DNR and that he would not  want intubation to support his breathing.   Hospital course from 19 October till now: Patient has presented with acute on chronic respiratory failure with hypoxia with CT imaging with extensive differential  diagnosis.  He has been treated as "worsening pneumonia" with increasing FiO2 requirements in the process.  He is currently on steroids.  Had initial improvement on symptoms however, now has seemed to plateau.  We are asked to render opinion as to how long steroids will be necessary.  He is currently requiring high flow O2 at 60%.  Pertinent  Medical History   Stage IV adenocarcinoma of the lung Myocardial infarction in the past Remote history of PE on Eliquis CAD hx PCI 2005 Chronic anemia secondary to cancer treatment Port-a-cath right chest Recently discharged 04/28/21 after treatment of aspiration pneumonia  Significant Hospital Events: Including procedures, antibiotic start and stop dates in addition to other pertinent events   10/18: Brought by EMS to ER with cc: sob, hypoxic sats 80% on RA and hypotensive. CXR concerning for LUL pna, sepsis with NSTEMI demand ischemia on workup. Started on cefepime, vancomycin, heparin continuous infusion and assigned to hospitalist service pending bed placement.  10/19: Evaluated by PCCM significant pulm history with recurrent pneumonia ? Aspiration versus HCAP, sepsis, NSTEMI, hypotension. Stable sats on 8lpm highflow oxygen, with increased WOB and accessory muscle use.  10/26: PCCM asked to reevaluate for determination of length of steroid therapy 10/26: CT chest with no significant change 10/27: Still requiring high flow O2 60%, 40 L flow 10/28: O2 requirements significantly decreased down to 45 % at 35 L flow  Interim History / Subjective:  Continues to feel that he is improving.  Oxygen requirement down today.  Difficulty bringing secretions up.  No conversational dyspnea noted today.  Objective   Blood pressure 105/64, pulse 96, temperature 97.9 F (36.6 C), resp. rate 17, height 5' 6" (1.676 m), weight 64 kg, SpO2 95 %.    SpO2: 95 % O2 Flow Rate (L/min): 35 L/min FiO2 (%): 45 %  Intake/Output Summary (Last 24 hours) at 05/17/2021  1001 Last data filed at 05/17/2021 0831 Gross per 24 hour  Intake 600 ml  Output 2585 ml  Net -1985 ml    Filed Weights   05/07/21 1439 05/10/21 0901 05/10/21 1537  Weight: 58.4 kg 63.2 kg 64 kg    Examination: GENERAL: Debilitated appearing gentleman, sitting up in chair, on high flow O2 via nasal cannula, no conversational dyspnea noted today.  More interactive. HEAD: Normocephalic, atraumatic.  EYES: Pupils equal, round, reactive to light.  No scleral icterus.  MOUTH: Oral mucosa moist. NECK: Supple. No thyromegaly. Trachea midline. No JVD.  No adenopathy. PULMONARY: Good air entry bilaterally.  Coarse throughout, crackles at bases.  No wheezes. CARDIOVASCULAR: S1 and S2. Regular rate and rhythm.  No rubs, murmurs or gallops heard. ABDOMEN: Scaphoid, otherwise benign. MUSCULOSKELETAL: No joint deformity, no clubbing, no edema.  Diffuse muscle wasting. NEUROLOGIC: No overt focal deficit, gait not tested.   SKIN: Intact,warm,dry. PSYCH: Clear mentation today.  Mood and behavior normal.  Resolved Hospital Problem list   N/A  Assessment & Plan:  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 DDX: Pemetrexed/Pembrolizumab toxicity, lymphangitic spread of CA, opportunistic infection (nonbacterial) - Oxygen supplementation to maintain O2 sats > 88% - Heated high flow for now taper as tolerated  - Continue duonebs q4h - Continue prednisone at  40 mg daily - PJP prophylaxis Bactrim DS Monday Wednesday Friday - Will need taper of steroids over 4 to 6-week period of time - Procalcitonin's have been negative, check KL6, inflammatory markers, LDH, Fungitell, immunoglobulins - Inflammatory markers are elevated (ESR/CRP) LDH 239 - Repeated CT chest, persistent interstitial and airspace process with no significant change - Mild  improvement clinically   Pneumonia ruled out Procalcitonin is normal Persistent findings on CT with differential as above -No need for antibiotics at present -Aspiration precautions -Leukocytosis likely due to steroids   ACUTE KIDNEY INJURY/Renal Failure - Avoid nephrotoxic agents as able - Follow urine output, BMP - Monitor closely on PJP prophylaxis - Ensure adequate renal perfusion, optimize oxygenation  Anemia, chronic secondary to cancer treatment - stable - Monitor routine CBC   Elevated d-dimer  History PE on Eliquis - CTA Chest 10/18 negative for PE - Continue Eliquis   Radiologic studies  Representative image from CT angio chest obtained 07 May 2021:  Interstitial and airspace process in both lungs  Representative image from CT chest obtained  26 October:   Persistent interstitial process   Labs   CBC: Recent Labs  Lab 05/11/21 0555 05/12/21 0530 05/15/21 0445 05/16/21 0603 05/17/21 0548  WBC 14.5* 15.3* 14.9* 17.5* 14.9*  NEUTROABS 14.0*  --   --   --   --   HGB 9.3* 9.8* 10.3* 11.0* 10.2*  HCT 28.6* 30.0* 31.6* 33.6* 31.4*  MCV 95.0 96.5 96.9 96.3 94.0  PLT 227 222 176 196 186     Basic Metabolic Panel: Recent Labs  Lab 05/12/21 0530 05/13/21 0515 05/14/21 0706 05/15/21 0445 05/16/21 0603 05/17/21 0548  NA 142 141 143 140 139 135  K 4.6 4.7 4.0 4.1 3.8 3.9  CL 105 103 104 102 99 94*  CO2 29 29 32 34* 34* 35*  GLUCOSE 146* 120* 96 109* 129* 154*  BUN 48* 41* 33* 33* 41* 46*  CREATININE 1.32* 1.22 1.13 1.15 1.41* 1.40*  CALCIUM 9.0 9.1 9.0 8.5* 8.7* 8.5*  MG 1.9 1.6* 2.1 2.1 1.9 2.0  PHOS 2.6 2.4*  --  3.6 4.5 4.1    GFR: Estimated Creatinine Clearance: 44.3 mL/min (A) (by C-G formula based on SCr of 1.4 mg/dL (H)). Recent Labs  Lab 05/12/21 0530 05/15/21 0445 05/16/21 0603 05/17/21 0548  WBC 15.3* 14.9* 17.5* 14.9*     Liver Function Tests: Recent Labs  Lab 05/11/21 0555 05/12/21 0530 05/13/21 0515  AST 20  --    --   ALT 19  --   --   ALKPHOS 74  --   --   BILITOT 0.8  --   --   PROT 5.5*  --   --   ALBUMIN 2.3* 2.4* 2.5*    No results for input(s): LIPASE, AMYLASE in the last 168 hours. No results for input(s): AMMONIA in the last 168 hours.  ABG    Component Value Date/Time   PHART 7.51 (H) 05/14/2021 1459   PCO2ART 38 05/14/2021 1459   PO2ART 51 (L) 05/14/2021 1459   HCO3 30.3 (H) 05/14/2021 1459   O2SAT 89.4 05/14/2021 1459     CBG: Recent Labs  Lab 05/12/21 1153 05/12/21 1621 05/12/21 2022  GLUCAP 170* 177* 132*     Review of Systems:   A 10 point review of systems was performed and it is as noted above otherwise negative.  Allergies No Known Allergies   Scheduled Meds:  apixaban  5 mg Oral BID  atorvastatin  40 mg Oral Daily   budesonide (PULMICORT) nebulizer solution  0.5 mg Nebulization BID   Chlorhexidine Gluconate Cloth  6 each Topical Daily   feeding supplement  237 mL Oral TID BM   FLUoxetine  40 mg Oral Daily   guaiFENesin  1,200 mg Oral BID   ipratropium-albuterol  3 mL Nebulization TID   OLANZapine  10 mg Oral QHS   predniSONE  40 mg Oral Q breakfast   sulfamethoxazole-trimethoprim  1 tablet Oral Once per day on Mon Wed Fri   Continuous Infusions:  sodium chloride 10 mL/hr (05/13/21 1620)   PRN Meds:.sodium chloride, acetaminophen, ALPRAZolam, docusate sodium, guaiFENesin-dextromethorphan, ondansetron (ZOFRAN) IV, ondansetron, oxyCODONE, polyethylene glycol, sodium chloride   Level 2 follow-up    Discussion: Patient appears to have more of an interstitial process that is failing to improve this is either steroid refractory immune checkpoint inhibitor (ICI) induced pneumonitis, lymphangitic spread of tumor, or nonbacterial opportunistic infection.  KL- 6 has been shown useful in determining ILD from other modalities, however this may take a few weeks to come back.  For now continue prednisone at 40 mg daily, he will need a 4 to 6-week slow taper.   Started PJP prophylaxis today (MWF).  Begin slow taper of steroids when O2 is decreased to regular nasal cannula.   We will see again on Monday, call PCCM on call over the weekend with questions/concerns.  Renold Don, MD Advanced Bronchoscopy PCCM [facility]    *This note was dictated using voice recognition software/Dragon.  Despite best efforts to proofread, errors can occur which can change the meaning.  Any change was purely unintentional.

## 2021-05-17 NOTE — Progress Notes (Signed)
OT Cancellation Note  Patient Details Name: Clayden Withem MRN: [id number] DOB: 07/12/1951   Cancelled Treatment:    Reason Eval/Treat Not Completed: Other (comment). Upon attempt, pt reports just starting to eat lunch. Will re-attempt OT tx at later time.   Ardeth Perfect., MPH, MS, OTR/L ascom [phone] 05/17/21, 12:54 PM

## 2021-05-17 NOTE — Telephone Encounter (Signed)
Per pt chart, currently admitted.  Will follow up once discharged.

## 2021-05-18 DIAGNOSIS — J9601 Acute respiratory failure with hypoxia: Secondary | ICD-10-CM | POA: Diagnosis not present

## 2021-05-18 LAB — CBC
HCT: 31.5 % — ABNORMAL LOW (ref 39.0–52.0)
Hemoglobin: 10.2 g/dL — ABNORMAL LOW (ref 13.0–17.0)
MCH: 30.8 pg (ref 26.0–34.0)
MCHC: 32.4 g/dL (ref 30.0–36.0)
MCV: 95.2 fL (ref 80.0–100.0)
Platelets: 184 10*3/uL (ref 150–400)
RBC: 3.31 MIL/uL — ABNORMAL LOW (ref 4.22–5.81)
RDW: 18.8 % — ABNORMAL HIGH (ref 11.5–15.5)
WBC: 15.2 10*3/uL — ABNORMAL HIGH (ref 4.0–10.5)
nRBC: 0 % (ref 0.0–0.2)

## 2021-05-18 LAB — BASIC METABOLIC PANEL
Anion gap: 7 (ref 5–15)
BUN: 40 mg/dL — ABNORMAL HIGH (ref 8–23)
CO2: 33 mmol/L — ABNORMAL HIGH (ref 22–32)
Calcium: 8.8 mg/dL — ABNORMAL LOW (ref 8.9–10.3)
Chloride: 97 mmol/L — ABNORMAL LOW (ref 98–111)
Creatinine, Ser: 1.16 mg/dL (ref 0.61–1.24)
GFR, Estimated: >60 mL/min (ref >60)
Glucose, Bld: 105 mg/dL — ABNORMAL HIGH (ref 70–99)
Potassium: 4.6 mmol/L (ref 3.5–5.1)
Sodium: 137 mmol/L (ref 135–145)

## 2021-05-18 LAB — FUNGITELL, SERUM: Fungitell Result: <31 pg/mL (ref <80)

## 2021-05-18 MED ORDER — IPRATROPIUM-ALBUTEROL 0.5-2.5 (3) MG/3ML IN SOLN
3.0000 mL | Freq: Four times a day (QID) | RESPIRATORY_TRACT | Status: DC
  Administered 2021-05-18 – 2021-05-21 (×13): 3 mL via RESPIRATORY_TRACT
  Filled 2021-05-18 (×13): qty 3

## 2021-05-18 MED ORDER — FLUTICASONE FUROATE-VILANTEROL 200-25 MCG/ACT IN AEPB
1.0000 | INHALATION_SPRAY | Freq: Every day | RESPIRATORY_TRACT | Status: DC
  Administered 2021-05-18 – 2021-05-21 (×4): 1 via RESPIRATORY_TRACT
  Filled 2021-05-18: qty 28

## 2021-05-18 MED ORDER — ALUM & MAG HYDROXIDE-SIMETH 200-200-20 MG/5ML PO SUSP
30.0000 mL | Freq: Four times a day (QID) | ORAL | Status: DC | PRN
  Administered 2021-05-18: 30 mL via ORAL
  Filled 2021-05-18: qty 30

## 2021-05-18 NOTE — Progress Notes (Signed)
PROGRESS NOTE  Jimmy West RCB:[id number] DOB: Sep 26, 1950   PCP: Pcp, No  Patient is from: Home.  Uses rolling walker at baseline.  DOA: 05/07/2021 LOS: 63  Chief complaints:  Chief Complaint  Patient presents with   Shortness of Breath     Brief Narrative / Interim history: 70 year old M with PMH of stage IV RLL lung cancer, cancer pain, CAD/stent in 2005, chronic respiratory failure with hypoxia on 2 L, anemia of chronic disease, anxiety, depression and debility presenting with generalized weakness, shortness of breath and dry cough, and admitted for acute on chronic hypoxic respiratory failure in the setting of LLL pneumonia and non-STEMI.  Started on broad-spectrum antibiotics, steroid and IV heparin.  Cardiology consulted recommended medical management and [place] outpatient.  CTA chest negative for PE.  Continues to require up to 6 L by [place] at rest. 10/25 patient became hypoxic and required directed oxygen, started high flow nasal cannula, FiO2 54%, inpatient setting around 90s  Subjective: No significant overnight events, patient still having significant shortness of breath and cough, feels comfortable on high flow nasal cannula, denies any worsening of shortness of breath.  Denies any chest pain or palpitations, no any other active issues.    Objective: Vitals:   05/18/21 0744 05/18/21 0811 05/18/21 1200 05/18/21 1321  BP: 108/68  109/74   Pulse: 85  87   Resp: 18  18   Temp: 98.2 F (36.8 C)  98 F (36.7 C)   TempSrc: Oral  Oral   SpO2: 92% 91% 93% 92%  Weight:      Height:        Intake/Output Summary (Last 24 hours) at 05/18/2021 1424 Last data filed at 05/18/2021 1019 Gross per 24 hour  Intake 840 ml  Output 2030 ml  Net -1190 ml   Filed Weights   05/07/21 1439 05/10/21 0901 05/10/21 1537  Weight: 58.4 kg 63.2 kg 64 kg    Examination:  GENERAL: Frail looking elderly male.  No apparent distress. HEENT: MMM.  RESP: 90% on 6 L.  Hardly  speaks in full sentence.  Crackles over LLL.  Diminished aeration.  Few scatterec rhonchi CVS:  RRR. Heart sounds normal.  ABD/GI/GU: BS+. Abd soft, NTND.  MSK/EXT:  Moves extremities. No apparent deformity. No edema.  SKIN: no apparent skin lesion or wound. Right subclavian portacath NEURO: Awake and alert. Oriented appropriately.  No apparent focal neuro deficit. PSYCH: Calm. Normal affect.   Procedures:  None  Microbiology summarized: OEHOZ-22 and influenza PCR nonreactive. MRSA PCR screen negative. Blood cultures NGTD.  Assessment & Plan: Acute on chronic respiratory failure with hypoxia due to LLL pneumonia with superimposed stage IV RLL lung cancer-on 2 L at baseline.  Currently requiring 6 L.  CTA chest negative for PE but concern for LLL pneumonia with RLL scarring from known lung cancer.  Pro-Cal elevated to 1.38.  Blood cultures negative. On cefepime since 10/18 making today day 7. S/p vancomycin - s/p Cefepime completed 7 days. S/p azithromycin 500 x 3 doses  - check CXR to eval for parapneumonic effusion which would require extended course abx - stop solumedrol, start oral prednisone, plan to wean -Wean oxygen as able. -IS/OOB/PT/OT - palliative consulted - Dr. B of oncology aware of the patient 10/25 oxygen requirement increased, patient is on HFNC 54% FiO2, patient is DNR/DNI, we will continue to monitor and start BiPAP if needed. ABG consistent with hypoxic respiratory failure CXR: Persistent interstitial and airspace opacities at the lung bases with  slight interval improvement. Remains concerning for multifocal pneumonia or pneumonitis. Known pulmonary mass not well evaluated S/p Lasix 20 mg IV twice daily,d/c;d on 10/27 due to slightly elevated creatinine Continue fluid restriction 1.5 L/day 10/26 discussed with the pulmonologist,  CT chest: stable RLL mass and bilateral interstitial and peribronchialar consolidative opacities,  extensive emphysematous changes,  multifocal infectious process vs inflammatory  Pneumonitis could be due to immunotherapy administration, and less likely lymphangitic carcinomatosis. 10/27 as per pulmonologist patient will need prednisone 40 mg daily and will need to 4 to 6 weeks slow taper, started PJP prophylaxis  10/29 started Breo Ellipta and changed DuoNeb to every 6 hourly scheduled as patient is still having significant shortness of breath.   Severe sepsis due to pneumonia:tachycardia, tachypnea, leukocytosis, AKI with pneumonia POA. Sepsis physiology resolved -Managementas above.   NSTEMI/history of CAD/stent in 2005: Troponin 3153 -> 3506 -> 2927 -> 2894.  EKG with T wave changes in lateral leads but no dynamic changes.  TTE with LVEF of 55 to 60%, G1-DD and no RWMA chest pain-free.  -Cardiology following -Completed 48 hours of IV heparin and transition back to home Eliquis. -Medical management due to poor candidacy for heart cath in the setting of respiratory failure -Cardiology recommends outpatient follow-up when stable from respiratory standpoint.  Chronic diastolic CHF: TTE as above.  Appears euvolemic on exam.  Does not seem to be on diuretics at home.  About 1.6 L UOP/24 hours. -Monitor fluid status   AKI/azotemia on CKD-3A: Improved.  Does not seem to be on nephrotoxic meds.  Could be hemodynamically mediated Recent Labs    05/09/21 0701 05/10/21 0517 05/11/21 0555 05/12/21 0530 05/13/21 0515 05/14/21 0706 05/15/21 0445 05/16/21 0603 05/17/21 0548 05/18/21 0620  BUN 30* 36* 42* 48* 41* 33* 33* 41* 46* 40*  CREATININE 1.44* 1.74* 1.39* 1.32* 1.22 1.13 1.15 1.41* 1.40* 1.16  -Continue monitoring -Avoid nephrotoxic meds.  Hypotension: Resolved.   Stage IV lung cancer s/p radiation and chemotherapy previously.   -Outpatient follow-up - oncology following    Anemia of chronic disease: H&H stable after 1 unit Recent Labs    05/07/21 1446 05/08/21 0552 05/09/21 0701 05/10/21 0517  05/11/21 0555 05/12/21 0530 05/15/21 0445 05/16/21 0603 05/17/21 0548 05/18/21 0620  HGB 9.9* 9.4* 8.1* 7.6* 9.3* 9.8* 10.3* 11.0* 10.2* 10.2*  -Continue monitoring   Chronic pain syndrome: -Continue oxycodone and bowel regimen as needed.   Depression and Anxiety: Stable -Continue with Fluoxetine 40 g p.o. daily as well as Olanzapine 10 mg p.o. nightly   Hx of PE: CTA chest negative for PE. -Continue home Eliquis  Physical deconditioning-uses rolling walker at baseline. -Therapy recommends SNF  Leukocytosis/bandemia-likely from steroid.  Goal of care counseling-patient with comorbidity as above.  Very frail and deconditioned.  Poor long-term prognosis.  Appropriately DNR/DNI.  A candidate for hospice -Palliative medicine consulted.  Body mass index is 22.77 kg/m.        DVT prophylaxis:   apixaban (ELIQUIS) tablet 5 mg  Code Status: DNR/DNI Family Communication: Patient and/or RN. Available if any question.  Level of care: Progressive Cardiac Status is: Inpatient  Remains inpatient appropriate because: Due to significant oxygen requirement, work of breathing and need for IV medications and close monitoring     Consultants:  Cardiology Pulmonology Palliative medicine   Sch Meds:  Scheduled Meds:  apixaban  5 mg Oral BID   atorvastatin  40 mg Oral Daily   budesonide (PULMICORT) nebulizer solution  0.5 mg Nebulization BID  Chlorhexidine Gluconate Cloth  6 each Topical Daily   feeding supplement  237 mL Oral TID BM   FLUoxetine  40 mg Oral Daily   fluticasone furoate-vilanterol  1 puff Inhalation Daily   guaiFENesin  1,200 mg Oral BID   ipratropium-albuterol  3 mL Nebulization Q6H   OLANZapine  10 mg Oral QHS   predniSONE  40 mg Oral Q breakfast   sulfamethoxazole-trimethoprim  1 tablet Oral Once per day on Mon Wed Fri   Continuous Infusions:  sodium chloride 10 mL/hr (05/13/21 1620)   PRN Meds:.sodium chloride, acetaminophen, ALPRAZolam, docusate  sodium, guaiFENesin-dextromethorphan, ondansetron (ZOFRAN) IV, ondansetron, oxyCODONE, polyethylene glycol, sodium chloride  Antimicrobials: Anti-infectives (From admission, onward)    Start     Dose/Rate Route Frequency Ordered Stop   05/17/21 0900  sulfamethoxazole-trimethoprim (BACTRIM DS) 800-160 MG per tablet 1 tablet        1 tablet Oral Once per day on Mon Wed Fri 05/16/21 0935     05/13/21 1030  ceFEPIme (MAXIPIME) 2 g in sodium chloride 0.9 % 100 mL IVPB        2 g 200 mL/hr over 30 Minutes Intravenous Every 12 hours 05/13/21 0952 05/13/21 1959   05/11/21 1630  azithromycin (ZITHROMAX) 500 mg in sodium chloride 0.9 % 250 mL IVPB        500 mg 250 mL/hr over 60 Minutes Intravenous Every 24 hours 05/11/21 1537 05/13/21 1721   05/08/21 1800  vancomycin (VANCOREADY) IVPB 750 mg/150 mL  Status:  Discontinued        750 mg 150 mL/hr over 60 Minutes Intravenous Every 24 hours 05/08/21 1135 05/09/21 1448   05/08/21 0800  ceFEPIme (MAXIPIME) 2 g in sodium chloride 0.9 % 100 mL IVPB  Status:  Discontinued        2 g 200 mL/hr over 30 Minutes Intravenous Every 12 hours 05/07/21 1929 05/13/21 0952   05/07/21 2200  ceFEPIme (MAXIPIME) 2 g in sodium chloride 0.9 % 100 mL IVPB  Status:  Discontinued        2 g 200 mL/hr over 30 Minutes Intravenous Every 12 hours 05/07/21 1927 05/07/21 1929   05/07/21 1930  vancomycin (VANCOREADY) IVPB 500 mg/100 mL        500 mg 100 mL/hr over 60 Minutes Intravenous  Once 05/07/21 1927 05/07/21 2144   05/07/21 1927  vancomycin variable dose per unstable renal function (pharmacist dosing)  Status:  Discontinued         Does not apply See admin instructions 05/07/21 1927 05/09/21 1448   05/07/21 1600  vancomycin (VANCOCIN) IVPB 1000 mg/200 mL premix        1,000 mg 200 mL/hr over 60 Minutes Intravenous  Once 05/07/21 1552 05/07/21 1801   05/07/21 1600  ceFEPIme (MAXIPIME) 2 g in sodium chloride 0.9 % 100 mL IVPB        2 g 200 mL/hr over 30 Minutes  Intravenous  Once 05/07/21 1552 05/07/21 1700        I have personally reviewed the following labs and images: CBC: Recent Labs  Lab 05/12/21 0530 05/15/21 0445 05/16/21 0603 05/17/21 0548 05/18/21 0620  WBC 15.3* 14.9* 17.5* 14.9* 15.2*  HGB 9.8* 10.3* 11.0* 10.2* 10.2*  HCT 30.0* 31.6* 33.6* 31.4* 31.5*  MCV 96.5 96.9 96.3 94.0 95.2  PLT 222 176 196 186 184   BMP &GFR Recent Labs  Lab 05/12/21 0530 05/13/21 0515 05/14/21 0706 05/15/21 0445 05/16/21 0603 05/17/21 0548 05/18/21 0620  NA 142 141  143 140 139 135 137  K 4.6 4.7 4.0 4.1 3.8 3.9 4.6  CL 105 103 104 102 99 94* 97*  CO2 29 29 32 34* 34* 35* 33*  GLUCOSE 146* 120* 96 109* 129* 154* 105*  BUN 48* 41* 33* 33* 41* 46* 40*  CREATININE 1.32* 1.22 1.13 1.15 1.41* 1.40* 1.16  CALCIUM 9.0 9.1 9.0 8.5* 8.7* 8.5* 8.8*  MG 1.9 1.6* 2.1 2.1 1.9 2.0  --   PHOS 2.6 2.4*  --  3.6 4.5 4.1  --    Estimated Creatinine Clearance: 53.5 mL/min (by C-G formula based on SCr of 1.16 mg/dL). Liver & Pancreas: Recent Labs  Lab 05/12/21 0530 05/13/21 0515  ALBUMIN 2.4* 2.5*   No results for input(s): LIPASE, AMYLASE in the last 168 hours. No results for input(s): AMMONIA in the last 168 hours. Diabetic: No results for input(s): HGBA1C in the last 72 hours. Recent Labs  Lab 05/12/21 1153 05/12/21 1621 05/12/21 2022  GLUCAP 170* 177* 132*   Cardiac Enzymes: No results for input(s): CKTOTAL, CKMB, CKMBINDEX, TROPONINI in the last 168 hours. No results for input(s): PROBNP in the last 8760 hours. Coagulation Profile: No results for input(s): INR, PROTIME in the last 168 hours.  Thyroid Function Tests: No results for input(s): TSH, T4TOTAL, FREET4, T3FREE, THYROIDAB in the last 72 hours. Lipid Profile: No results for input(s): CHOL, HDL, LDLCALC, TRIG, CHOLHDL, LDLDIRECT in the last 72 hours.  Anemia Panel: No results for input(s): VITAMINB12, FOLATE, FERRITIN, TIBC, IRON, RETICCTPCT in the last 72 hours.  Urine  analysis:    Component Value Date/Time   COLORURINE YELLOW (A) 05/07/2021 2044   APPEARANCEUR CLEAR (A) 05/07/2021 2044   LABSPEC 1.021 05/07/2021 2044   PHURINE 6.0 05/07/2021 2044   GLUCOSEU NEGATIVE 05/07/2021 2044   HGBUR NEGATIVE 05/07/2021 2044   BILIRUBINUR NEGATIVE 05/07/2021 2044   [place] 05/07/2021 2044   PROTEINUR NEGATIVE 05/07/2021 2044   NITRITE NEGATIVE 05/07/2021 2044   LEUKOCYTESUR NEGATIVE 05/07/2021 2044   Sepsis Labs: Invalid input(s): PROCALCITONIN, [place]  Microbiology: Recent Results (from the past 240 hour(s))  MRSA Next Gen by PCR, Nasal     Status: None   Collection Time: 05/09/21  1:16 PM   Specimen: Nasal Mucosa; Nasal Swab  Result Value Ref Range Status   MRSA by PCR Next Gen NOT DETECTED NOT DETECTED Final    Comment: (NOTE) The GeneXpert MRSA Assay (FDA approved for NASAL specimens only), is one component of a comprehensive MRSA colonization surveillance program. It is not intended to diagnose MRSA infection nor to guide or monitor treatment for MRSA infections. Test performance is not FDA approved in patients less than 86 years old. Performed at [facility], [address] [place], [place] [address]     Radiology Studies: No results found.     Val Riles, MD [facility]  If 7PM-7AM, please contact night-coverage [email or web address] 05/18/2021, 2:24 PM

## 2021-05-18 NOTE — TOC Progression Note (Signed)
Transition of Care [facility]) - Progression Note    Patient Details  Name: Marquez Ceesay MRN: [id number] Date of Birth: 09-Mar-1951  Transition of Care [facility]) CM/SW [place], LCSW Phone Number: 05/18/2021, 4:37 PM  Clinical Narrative:   Patient remains on HFNC. Currently no SNF bed offers, and not medically ready yet for SNF. TOC will continue to follow.    Expected Discharge Plan: Skilled Nursing Facility Barriers to Discharge: No Barriers Identified  Expected Discharge Plan and Services Expected Discharge Plan: [place] Choice: [place] arrangements for the past 2 months: Single Family Home                                       Social Determinants of Health (SDOH) Interventions    Readmission Risk Interventions Readmission Risk Prevention Plan 04/26/2021  Transportation Screening Complete  PCP or Specialist Appt within 3-5 Days Complete  HRI or [place] Complete  Social Work Consult for [place] Planning/Counseling Complete  Palliative Care Screening Not Applicable  Medication Review Press photographer) Complete  Some recent data might be hidden

## 2021-05-18 NOTE — Progress Notes (Signed)
Occupational Therapy Treatment Patient Details Name: Jimmy West MRN: [id number] DOB: Sep 26, 1950 Today's Date: 05/18/2021   History of present illness 70 y.o. male was brought to hosp to admit 10/18 for hypoxia, weakness, SOB and noted sepsis from PNA, as well as NSTEMI.  Troponins are high from demand ischemia, EF 40-45%, and has recent asp PNA per chart.  PMHx:  R lower lobe stage IV adenocarcinoma of the lung, chronic anemia, anxiety, depression, presence of Port-A-Cath, cad s/p PCI with stent placement in 2005, tobacco abuse quit in 2005   OT comments  Mr. Bethel was seen for OT Tx this date. Pt received semi-supine in bed. He remains on 40L Heated HFNC (51% fiO2) t/o session. Pt is agreeable to OT tx session and voices desire "to wash my face and change my clothes". Therapist facilitates UB/LB ADLs as described below with education on falls prevention and energy conservation techniques provided t/o session. Pt return demos understanding of pursed lip breathing and activity pacing techniques t/o session. He requires MOD A for LB dressing, CGA for functional mobility for mgt of lines and leads, and set-up assist for UB grooming. O2 sats generally stable at 88-90% with brief desaturations down to low 80's during functional activity. Quickly rebounds with cues for PLB and therapeutic rest breaks.Pt continues to benefit from skilled OT services to maximize safety and return to PLOF. Will continue to follow POC as written. DC rec remains appropriate.    Recommendations for follow up therapy are one component of a multi-disciplinary discharge planning process, led by the attending physician.  Recommendations may be updated based on patient status, additional functional criteria and insurance authorization.    Follow Up Recommendations  Skilled nursing-short term rehab (<3 hours/day)    Assistance Recommended at Discharge Intermittent Supervision/Assistance  Equipment Recommendations        Recommendations for Other Services      Precautions / Restrictions Precautions Precautions: Fall Precaution Comments: monitor sats and HR Restrictions Weight Bearing Restrictions: No       Mobility Bed Mobility Overal bed mobility: Needs Assistance Bed Mobility: Supine to Sit     Supine to sit: Min guard;Supervision     General bed mobility comments: SBA for mgt of lines and leads during functional mobility.    Transfers Overall transfer level: Needs assistance Equipment used: Rolling walker (2 wheels) Transfers: Sit to/from [facility] Sit to Stand: Min guard Stand pivot transfers: Min guard               Balance Overall balance assessment: Needs assistance Sitting-balance support: Feet supported;Single extremity supported Sitting balance-Leahy Scale: Fair Sitting balance - Comments: Generally stable static sitting with at least 1 UE support.   Standing balance support: Bilateral upper extremity supported;Single extremity supported;During functional activity Standing balance-Leahy Scale: Fair                             ADL either performed or assessed with clinical judgement   ADL Overall ADL's : Needs assistance/impaired     Grooming: Wash/dry face;Sitting;Set up;Cueing for compensatory techniques               Lower Body Dressing: Moderate assistance;Sitting/lateral leans Lower Body Dressing Details (indicate cue type and reason): MOD A to doff/don LB clothing including underwear and pants with cues for ECS t/o and education on lateral leans for improved safety/functional indep.  Functional mobility during ADLs: Supervision/safety;Set up;Rolling walker (2 wheels);Min guard General ADL Comments: Significantly SOB with even limited activity. Educated on energy conservation techniques with heavy emphasis on PLB and activity pacing t/o session. Increased time/effort to perform all functional tasks this date.      Vision Baseline Vision/History: 1 Wears glasses Patient Visual Report: No change from baseline     Perception     Praxis      Cognition Arousal/Alertness: Awake/alert Behavior During Therapy: WFL for tasks assessed/performed Overall Cognitive Status: Within Functional Limits for tasks assessed                                 General Comments: Pt reports feeling"the best I have since I got here"          Exercises Other Exercises Other Exercises: OT facilitates bed/functional mobility, LB dressing tasks, & UB grooming as described above. Education on ECS and safety provided t/o session.   Shoulder Instructions       General Comments Pt on Heated High Flow [place] t/o session 40L FiO2 51% per room monitor. O2 sats generally stable at 88-90% with brief desaturations down to low 80's during functional activity. Quickly rebounds with cues for PLB and therapeutic rest breaks.    Pertinent Vitals/ Pain       Pain Assessment: No/denies pain  Home Living                                          Prior Functioning/Environment              Frequency  Min 2X/week        Progress Toward Goals  OT Goals(current goals can now be found in the care plan section)  Progress towards OT goals: Progressing toward goals  Acute Rehab OT Goals OT Goal Formulation: With patient Time For Goal Achievement: 05/25/21 Potential to Achieve Goals: Good  Plan Discharge plan remains appropriate;Frequency needs to be updated    Co-evaluation                 AM-PAC OT "6 Clicks" Daily Activity     Outcome Measure   Help from another person eating meals?: None Help from another person taking care of personal grooming?: A Little Help from another person toileting, which includes using toliet, bedpan, or urinal?: A Little Help from another person bathing (including washing, rinsing, drying)?: A Lot Help from another person to put on and taking off  regular upper body clothing?: A Little Help from another person to put on and taking off regular lower body clothing?: A Lot 6 Click Score: 17    End of Session Equipment Utilized During Treatment: Oxygen;Rolling walker (2 wheels)  OT Visit Diagnosis: Other abnormalities of gait and mobility (R26.89);Muscle weakness (generalized) (M62.81)   Activity Tolerance Patient tolerated treatment well   Patient Left in chair;with call bell/phone within reach   Nurse Communication Mobility status        Time: 2836-6294 OT Time Calculation (min): 32 min  Charges: OT General Charges $OT Visit: 1 Visit OT Treatments $Self Care/Home Management : 23-37 mins  Shara Blazing, M.S., OTR/L Ascom: [phone] 05/18/21, 3:13 PM

## 2021-05-19 ENCOUNTER — Inpatient Hospital Stay: Payer: Medicare HMO

## 2021-05-19 DIAGNOSIS — J9601 Acute respiratory failure with hypoxia: Secondary | ICD-10-CM | POA: Diagnosis not present

## 2021-05-19 LAB — CBC
HCT: 30.3 % — ABNORMAL LOW (ref 39.0–52.0)
Hemoglobin: 9.6 g/dL — ABNORMAL LOW (ref 13.0–17.0)
MCH: 30 pg (ref 26.0–34.0)
MCHC: 31.7 g/dL (ref 30.0–36.0)
MCV: 94.7 fL (ref 80.0–100.0)
Platelets: 175 10*3/uL (ref 150–400)
RBC: 3.2 MIL/uL — ABNORMAL LOW (ref 4.22–5.81)
RDW: 18.4 % — ABNORMAL HIGH (ref 11.5–15.5)
WBC: 12.4 10*3/uL — ABNORMAL HIGH (ref 4.0–10.5)
nRBC: 0 % (ref 0.0–0.2)

## 2021-05-19 LAB — BASIC METABOLIC PANEL
Anion gap: 8 (ref 5–15)
BUN: 41 mg/dL — ABNORMAL HIGH (ref 8–23)
CO2: 32 mmol/L (ref 22–32)
Calcium: 8.9 mg/dL (ref 8.9–10.3)
Chloride: 96 mmol/L — ABNORMAL LOW (ref 98–111)
Creatinine, Ser: 1.16 mg/dL (ref 0.61–1.24)
GFR, Estimated: >60 mL/min (ref >60)
Glucose, Bld: 95 mg/dL (ref 70–99)
Potassium: 4.6 mmol/L (ref 3.5–5.1)
Sodium: 136 mmol/L (ref 135–145)

## 2021-05-19 NOTE — Progress Notes (Signed)
PROGRESS NOTE  Jimmy West CZY:[id number] DOB: May 17, 1951   PCP: Pcp, No  Patient is from: Home.  Uses rolling walker at baseline.  DOA: 05/07/2021 LOS: 12  Chief complaints:  Chief Complaint  Patient presents with   Shortness of Breath     Brief Narrative / Interim history: 70 year old M with PMH of stage IV RLL lung cancer, cancer pain, CAD/stent in 2005, chronic respiratory failure with hypoxia on 2 L, anemia of chronic disease, anxiety, depression and debility presenting with generalized weakness, shortness of breath and dry cough, and admitted for acute on chronic hypoxic respiratory failure in the setting of LLL pneumonia and non-STEMI.  Started on broad-spectrum antibiotics, steroid and IV heparin.  Cardiology consulted recommended medical management and [place] outpatient.  CTA chest negative for PE.  Continues to require up to 6 L by [place] at rest. 10/25 patient became hypoxic and required directed oxygen, started high flow nasal cannula, FiO2 54%, inpatient setting around 90s  Subjective: No significant overnight events, complaining of dyspnea on exertion and desaturation while getting out of the bed.  Patient was resting comfortably on high flow nasal cannula, does not feel any worsening of condition at rest.  Denies any chest pain or palpitations, no any other active issues.   Objective: Vitals:   05/19/21 0759 05/19/21 1157 05/19/21 1208 05/19/21 1331  BP:  119/81    Pulse:  (!) 109    Resp:  17    Temp:  98.8 F (37.1 C)    TempSrc:      SpO2: 94% 92% 91% 92%  Weight:      Height:        Intake/Output Summary (Last 24 hours) at 05/19/2021 1529 Last data filed at 05/19/2021 1200 Gross per 24 hour  Intake 480 ml  Output 2625 ml  Net -2145 ml   Filed Weights   05/07/21 1439 05/10/21 0901 05/10/21 1537  Weight: 58.4 kg 63.2 kg 64 kg    Examination:  GENERAL: Frail looking elderly male.  No apparent distress. HEENT: MMM.  RESP: 90% on 6 L.   Hardly speaks in full sentence.  Crackles over LLL.  Diminished aeration.  Few scatterec rhonchi CVS:  RRR. Heart sounds normal.  ABD/GI/GU: BS+. Abd soft, NTND.  MSK/EXT:  Moves extremities. No apparent deformity. No edema.  SKIN: no apparent skin lesion or wound. Right subclavian portacath NEURO: Awake and alert. Oriented appropriately.  No apparent focal neuro deficit. PSYCH: Calm. Normal affect.   Procedures:  None  Microbiology summarized: [id number] and influenza PCR nonreactive. MRSA PCR screen negative. Blood cultures NGTD.  Assessment & Plan: Acute on chronic respiratory failure with hypoxia due to LLL pneumonia with superimposed stage IV RLL lung cancer-on 2 L at baseline.  Currently requiring 6 L.  CTA chest negative for PE but concern for LLL pneumonia with RLL scarring from known lung cancer.  Pro-Cal elevated to 1.38.  Blood cultures negative. On cefepime since 10/18 making today day 7. S/p vancomycin - s/p Cefepime completed 7 days. S/p azithromycin 500 x 3 doses  - check CXR to eval for parapneumonic effusion which would require extended course abx - stop solumedrol, start oral prednisone, plan to wean -Wean oxygen as able. -IS/OOB/PT/OT - palliative consulted - Dr. B of oncology aware of the patient 10/25 oxygen requirement increased, patient is on HFNC 54% FiO2, patient is DNR/DNI, we will continue to monitor and start BiPAP if needed. ABG consistent with hypoxic respiratory failure CXR: Persistent interstitial and airspace  opacities at the lung bases with slight interval improvement. Remains concerning for multifocal pneumonia or pneumonitis. Known pulmonary mass not well evaluated S/p Lasix 20 mg IV twice daily,d/c;d on 10/27 due to slightly elevated creatinine Continue fluid restriction 1.5 L/day 10/26 discussed with the pulmonologist,  CT chest: stable RLL mass and bilateral interstitial and peribronchialar consolidative opacities,  extensive emphysematous changes,  multifocal infectious process vs inflammatory  Pneumonitis could be due to immunotherapy administration, and less likely lymphangitic carcinomatosis. 10/27 as per pulmonologist patient will need prednisone 40 mg daily and will need to 4 to 6 weeks slow taper, started PJP prophylaxis  10/29 started Breo Ellipta and changed DuoNeb to every 6 hourly scheduled as patient is still having significant shortness of breath. 10/30 CXR Stable bilateral lower lobe airspace opacities are noted concerning for pneumonia  Severe sepsis due to pneumonia:tachycardia, tachypnea, leukocytosis, AKI with pneumonia POA. Sepsis physiology resolved -Managementas above.   NSTEMI/history of CAD/stent in 2005: Troponin 3153 -> 3506 -> 2927 -> 2894.  EKG with T wave changes in lateral leads but no dynamic changes.  TTE with LVEF of 55 to 60%, G1-DD and no RWMA chest pain-free.  -Cardiology following -Completed 48 hours of IV heparin and transition back to home Eliquis. -Medical management due to poor candidacy for heart cath in the setting of respiratory failure -Cardiology recommends outpatient follow-up when stable from respiratory standpoint.  Chronic diastolic CHF: TTE as above.  Appears euvolemic on exam.  Does not seem to be on diuretics at home.  About 1.6 L UOP/24 hours. -Monitor fluid status   AKI/azotemia on CKD-3A: Improved.  Does not seem to be on nephrotoxic meds.  Could be hemodynamically mediated Recent Labs    05/10/21 0517 05/11/21 0555 05/12/21 0530 05/13/21 0515 05/14/21 0706 05/15/21 0445 05/16/21 0603 05/17/21 0548 05/18/21 0620 05/19/21 0457  BUN 36* 42* 48* 41* 33* 33* 41* 46* 40* 41*  CREATININE 1.74* 1.39* 1.32* 1.22 1.13 1.15 1.41* 1.40* 1.16 1.16  -Continue monitoring -Avoid nephrotoxic meds.  Hypotension: Resolved.   Stage IV lung cancer s/p radiation and chemotherapy previously.   -Outpatient follow-up - oncology following    Anemia of chronic disease: H&H stable after 1  unit Recent Labs    05/08/21 0552 05/09/21 0701 05/10/21 0517 05/11/21 0555 05/12/21 0530 05/15/21 0445 05/16/21 0603 05/17/21 0548 05/18/21 0620 05/19/21 0457  HGB 9.4* 8.1* 7.6* 9.3* 9.8* 10.3* 11.0* 10.2* 10.2* 9.6*  -Continue monitoring   Chronic pain syndrome: -Continue oxycodone and bowel regimen as needed.   Depression and Anxiety: Stable -Continue with Fluoxetine 40 g p.o. daily as well as Olanzapine 10 mg p.o. nightly   Hx of PE: CTA chest negative for PE. -Continue home Eliquis  Physical deconditioning-uses rolling walker at baseline. -Therapy recommends SNF  Leukocytosis/bandemia-likely from steroid.  Goal of care counseling-patient with comorbidity as above.  Very frail and deconditioned.  Poor long-term prognosis.  Appropriately DNR/DNI.  A candidate for hospice -Palliative medicine consulted.  Body mass index is 22.77 kg/m.        DVT prophylaxis:   apixaban (ELIQUIS) tablet 5 mg  Code Status: DNR/DNI Family Communication: Patient and/or RN. Available if any question.  Level of care: Progressive Cardiac Status is: Inpatient  Remains inpatient appropriate because: Due to significant oxygen requirement, work of breathing and need for IV medications and close monitoring     Consultants:  Cardiology Pulmonology Palliative medicine   Sch Meds:  Scheduled Meds:  apixaban  5 mg Oral BID  atorvastatin  40 mg Oral Daily   budesonide (PULMICORT) nebulizer solution  0.5 mg Nebulization BID   Chlorhexidine Gluconate Cloth  6 each Topical Daily   feeding supplement  237 mL Oral TID BM   FLUoxetine  40 mg Oral Daily   fluticasone furoate-vilanterol  1 puff Inhalation Daily   guaiFENesin  1,200 mg Oral BID   ipratropium-albuterol  3 mL Nebulization Q6H   OLANZapine  10 mg Oral QHS   predniSONE  40 mg Oral Q breakfast   sulfamethoxazole-trimethoprim  1 tablet Oral Once per day on Mon Wed Fri   Continuous Infusions:  sodium chloride 10 mL/hr  (05/13/21 1620)   PRN Meds:.sodium chloride, acetaminophen, ALPRAZolam, alum & mag hydroxide-simeth, docusate sodium, guaiFENesin-dextromethorphan, ondansetron (ZOFRAN) IV, ondansetron, oxyCODONE, polyethylene glycol, sodium chloride  Antimicrobials: Anti-infectives (From admission, onward)    Start     Dose/Rate Route Frequency Ordered Stop   05/17/21 0900  sulfamethoxazole-trimethoprim (BACTRIM DS) 800-160 MG per tablet 1 tablet        1 tablet Oral Once per day on Mon Wed Fri 05/16/21 0935     05/13/21 1030  ceFEPIme (MAXIPIME) 2 g in sodium chloride 0.9 % 100 mL IVPB        2 g 200 mL/hr over 30 Minutes Intravenous Every 12 hours 05/13/21 0952 05/13/21 1959   05/11/21 1630  azithromycin (ZITHROMAX) 500 mg in sodium chloride 0.9 % 250 mL IVPB        500 mg 250 mL/hr over 60 Minutes Intravenous Every 24 hours 05/11/21 1537 05/13/21 1721   05/08/21 1800  vancomycin (VANCOREADY) IVPB 750 mg/150 mL  Status:  Discontinued        750 mg 150 mL/hr over 60 Minutes Intravenous Every 24 hours 05/08/21 1135 05/09/21 1448   05/08/21 0800  ceFEPIme (MAXIPIME) 2 g in sodium chloride 0.9 % 100 mL IVPB  Status:  Discontinued        2 g 200 mL/hr over 30 Minutes Intravenous Every 12 hours 05/07/21 1929 05/13/21 0952   05/07/21 2200  ceFEPIme (MAXIPIME) 2 g in sodium chloride 0.9 % 100 mL IVPB  Status:  Discontinued        2 g 200 mL/hr over 30 Minutes Intravenous Every 12 hours 05/07/21 1927 05/07/21 1929   05/07/21 1930  vancomycin (VANCOREADY) IVPB 500 mg/100 mL        500 mg 100 mL/hr over 60 Minutes Intravenous  Once 05/07/21 1927 05/07/21 2144   05/07/21 1927  vancomycin variable dose per unstable renal function (pharmacist dosing)  Status:  Discontinued         Does not apply See admin instructions 05/07/21 1927 05/09/21 1448   05/07/21 1600  vancomycin (VANCOCIN) IVPB 1000 mg/200 mL premix        1,000 mg 200 mL/hr over 60 Minutes Intravenous  Once 05/07/21 1552 05/07/21 1801   05/07/21  1600  ceFEPIme (MAXIPIME) 2 g in sodium chloride 0.9 % 100 mL IVPB        2 g 200 mL/hr over 30 Minutes Intravenous  Once 05/07/21 1552 05/07/21 1700        I have personally reviewed the following labs and images: CBC: Recent Labs  Lab 05/15/21 0445 05/16/21 0603 05/17/21 0548 05/18/21 0620 05/19/21 0457  WBC 14.9* 17.5* 14.9* 15.2* 12.4*  HGB 10.3* 11.0* 10.2* 10.2* 9.6*  HCT 31.6* 33.6* 31.4* 31.5* 30.3*  MCV 96.9 96.3 94.0 95.2 94.7  PLT 176 196 186 184 175   BMP &  GFR Recent Labs  Lab 05/13/21 0515 05/14/21 0706 05/15/21 0445 05/16/21 0603 05/17/21 0548 05/18/21 0620 05/19/21 0457  NA 141 143 140 139 135 137 136  K 4.7 4.0 4.1 3.8 3.9 4.6 4.6  CL 103 104 102 99 94* 97* 96*  CO2 29 32 34* 34* 35* 33* 32  GLUCOSE 120* 96 109* 129* 154* 105* 95  BUN 41* 33* 33* 41* 46* 40* 41*  CREATININE 1.22 1.13 1.15 1.41* 1.40* 1.16 1.16  CALCIUM 9.1 9.0 8.5* 8.7* 8.5* 8.8* 8.9  MG 1.6* 2.1 2.1 1.9 2.0  --   --   PHOS 2.4*  --  3.6 4.5 4.1  --   --    Estimated Creatinine Clearance: 53.5 mL/min (by C-G formula based on SCr of 1.16 mg/dL). Liver & Pancreas: Recent Labs  Lab 05/13/21 0515  ALBUMIN 2.5*   No results for input(s): LIPASE, AMYLASE in the last 168 hours. No results for input(s): AMMONIA in the last 168 hours. Diabetic: No results for input(s): HGBA1C in the last 72 hours. Recent Labs  Lab 05/12/21 1621 05/12/21 2022  GLUCAP 177* 132*   Cardiac Enzymes: No results for input(s): CKTOTAL, CKMB, CKMBINDEX, TROPONINI in the last 168 hours. No results for input(s): PROBNP in the last 8760 hours. Coagulation Profile: No results for input(s): INR, PROTIME in the last 168 hours.  Thyroid Function Tests: No results for input(s): TSH, T4TOTAL, FREET4, T3FREE, THYROIDAB in the last 72 hours. Lipid Profile: No results for input(s): CHOL, HDL, LDLCALC, TRIG, CHOLHDL, LDLDIRECT in the last 72 hours.  Anemia Panel: No results for input(s): VITAMINB12, FOLATE,  FERRITIN, TIBC, IRON, RETICCTPCT in the last 72 hours.  Urine analysis:    Component Value Date/Time   COLORURINE YELLOW (A) 05/07/2021 2044   APPEARANCEUR CLEAR (A) 05/07/2021 2044   LABSPEC 1.021 05/07/2021 2044   PHURINE 6.0 05/07/2021 2044   GLUCOSEU NEGATIVE 05/07/2021 2044   HGBUR NEGATIVE 05/07/2021 2044   BILIRUBINUR NEGATIVE 05/07/2021 2044   [place] 05/07/2021 2044   PROTEINUR NEGATIVE 05/07/2021 2044   NITRITE NEGATIVE 05/07/2021 2044   LEUKOCYTESUR NEGATIVE 05/07/2021 2044   Sepsis Labs: Invalid input(s): PROCALCITONIN, [place]  Microbiology: No results found for this or any previous visit (from the past 240 hour(s)).   Radiology Studies: DG Chest Port 1 View  Result Date: 05/19/2021 CLINICAL DATA:  Shortness of breath.  History of lung cancer. EXAM: PORTABLE CHEST 1 VIEW COMPARISON:  May 14, 2021. FINDINGS: The heart size and mediastinal contours are within normal limits. Right internal jugular Port-A-Cath is unchanged in position. Stable bilateral lower lobe airspace opacities are noted concerning for pneumonia. No pneumothorax is noted. The visualized skeletal structures are unremarkable. IMPRESSION: Stable bilateral lower lobe airspace opacities are noted concerning for pneumonia. Electronically Signed   By: Marijo Conception M.D.   On: 05/19/2021 13:27       Val Riles, MD [facility]  If 7PM-7AM, please contact night-coverage [email or web address] 05/19/2021, 3:29 PM

## 2021-05-20 ENCOUNTER — Other Ambulatory Visit: Payer: Self-pay | Admitting: *Deleted

## 2021-05-20 ENCOUNTER — Telehealth: Payer: Self-pay | Admitting: *Deleted

## 2021-05-20 DIAGNOSIS — J9601 Acute respiratory failure with hypoxia: Secondary | ICD-10-CM | POA: Diagnosis not present

## 2021-05-20 DIAGNOSIS — G893 Neoplasm related pain (acute) (chronic): Secondary | ICD-10-CM

## 2021-05-20 LAB — BASIC METABOLIC PANEL
Anion gap: 6 (ref 5–15)
BUN: 33 mg/dL — ABNORMAL HIGH (ref 8–23)
CO2: 35 mmol/L — ABNORMAL HIGH (ref 22–32)
Calcium: 9 mg/dL (ref 8.9–10.3)
Chloride: 98 mmol/L (ref 98–111)
Creatinine, Ser: 1.09 mg/dL (ref 0.61–1.24)
GFR, Estimated: >60 mL/min (ref >60)
Glucose, Bld: 104 mg/dL — ABNORMAL HIGH (ref 70–99)
Potassium: 4.5 mmol/L (ref 3.5–5.1)
Sodium: 139 mmol/L (ref 135–145)

## 2021-05-20 LAB — CBC
HCT: 31.5 % — ABNORMAL LOW (ref 39.0–52.0)
Hemoglobin: 9.8 g/dL — ABNORMAL LOW (ref 13.0–17.0)
MCH: 30 pg (ref 26.0–34.0)
MCHC: 31.1 g/dL (ref 30.0–36.0)
MCV: 96.3 fL (ref 80.0–100.0)
Platelets: 193 10*3/uL (ref 150–400)
RBC: 3.27 MIL/uL — ABNORMAL LOW (ref 4.22–5.81)
RDW: 18.6 % — ABNORMAL HIGH (ref 11.5–15.5)
WBC: 11.8 10*3/uL — ABNORMAL HIGH (ref 4.0–10.5)
nRBC: 0 % (ref 0.0–0.2)

## 2021-05-20 MED ORDER — ACETAMINOPHEN 500 MG PO TABS
1000.0000 mg | ORAL_TABLET | Freq: Three times a day (TID) | ORAL | Status: DC | PRN
  Administered 2021-06-03: 1000 mg via ORAL
  Filled 2021-05-20: qty 2

## 2021-05-20 MED ORDER — OXYCODONE HCL 20 MG PO TABS: 20.0000 mg | ORAL_TABLET | ORAL | 0 refills | Status: AC | PRN

## 2021-05-20 NOTE — Progress Notes (Signed)
PROGRESS NOTE  Jimmy West WRU:[id number] DOB: Feb 28, 1951   PCP: Pcp, No  Patient is from: Home.  Uses rolling walker at baseline.  DOA: 05/07/2021 LOS: 17  Chief complaints:  Chief Complaint  Patient presents with   Shortness of Breath     Brief Narrative / Interim history: 70 year old M with PMH of stage IV RLL lung cancer, cancer pain, CAD/stent in 2005, chronic respiratory failure with hypoxia on 2 L, anemia of chronic disease, anxiety, depression and debility presenting with generalized weakness, shortness of breath and dry cough, and admitted for acute on chronic hypoxic respiratory failure in the setting of LLL pneumonia and non-STEMI.  Started on broad-spectrum antibiotics, steroid and IV heparin.  Cardiology consulted recommended medical management and [place] outpatient.  CTA chest negative for PE.  Continues to require up to 6 L by [place] at rest. 10/25 patient became hypoxic and required directed oxygen, started high flow nasal cannula, FiO2 54%, inpatient setting around 90s  Subjective: No significant overnight events, complaining of dyspnea on exertion and desaturation while getting out of the bed.  Patient was resting comfortably on high flow nasal cannula, does not feel any worsening of condition at rest.  Denies any chest pain or palpitations, no any other active issues.   Objective: Vitals:   05/20/21 1200 05/20/21 1317 05/20/21 1333 05/20/21 1551  BP: 116/65   91/71  Pulse: 98   87  Resp: 18   17  Temp: 98.1 F (36.7 C)   98.3 F (36.8 C)  TempSrc:      SpO2: 92% 90% 91% 96%  Weight:      Height:        Intake/Output Summary (Last 24 hours) at 05/20/2021 1747 Last data filed at 05/20/2021 1047 Gross per 24 hour  Intake 480 ml  Output 2700 ml  Net -2220 ml   Filed Weights   05/07/21 1439 05/10/21 0901 05/10/21 1537  Weight: 58.4 kg 63.2 kg 64 kg    Examination:  GENERAL: Frail looking elderly male.  No apparent distress. HEENT: MMM.   RESP: 90% on 6 L.  Hardly speaks in full sentence.  Crackles over LLL.  Diminished aeration.  Few scatterec rhonchi CVS:  RRR. Heart sounds normal.  ABD/GI/GU: BS+. Abd soft, NTND.  MSK/EXT:  Moves extremities. No apparent deformity. No edema.  SKIN: no apparent skin lesion or wound. Right subclavian portacath NEURO: Awake and alert. Oriented appropriately.  No apparent focal neuro deficit. PSYCH: Calm. Normal affect.   Procedures:  None  Microbiology summarized: [id number] and influenza PCR nonreactive. MRSA PCR screen negative. Blood cultures NGTD.  Assessment & Plan: Acute on chronic respiratory failure with hypoxia due to LLL pneumonia with superimposed stage IV RLL lung cancer-on 2 L at baseline.  Currently requiring 6 L.  CTA chest negative for PE but concern for LLL pneumonia with RLL scarring from known lung cancer.  Pro-Cal elevated to 1.38.  Blood cultures negative. On cefepime since 10/18 making today day 7. S/p vancomycin - s/p Cefepime completed 7 days. S/p azithromycin 500 x 3 doses  - check CXR to eval for parapneumonic effusion which would require extended course abx - stop solumedrol, start oral prednisone, plan to wean -Wean oxygen as able. -IS/OOB/PT/OT - palliative consulted - Dr. B of oncology aware of the patient 10/25 oxygen requirement increased, patient is on HFNC 54% FiO2, patient is DNR/DNI, we will continue to monitor and start BiPAP if needed. ABG consistent with hypoxic respiratory failure CXR: Persistent interstitial  and airspace opacities at the lung bases with slight interval improvement. Remains concerning for multifocal pneumonia or pneumonitis. Known pulmonary mass not well evaluated S/p Lasix 20 mg IV twice daily,d/c;d on 10/27 due to slightly elevated creatinine Continue fluid restriction 1.5 L/day 10/26 discussed with the pulmonologist,  CT chest: stable RLL mass and bilateral interstitial and peribronchialar consolidative opacities,  extensive  emphysematous changes, multifocal infectious process vs inflammatory  Pneumonitis could be due to immunotherapy administration, and less likely lymphangitic carcinomatosis. 10/27 as per pulmonologist patient will need prednisone 40 mg daily and will need to 4 to 6 weeks slow taper, started PJP prophylaxis  10/29 started Breo Ellipta and changed DuoNeb to every 6 hourly scheduled as patient is still having significant shortness of breath. 10/30 CXR Stable bilateral lower lobe airspace opacities are noted concerning for pneumonia  Severe sepsis due to pneumonia:tachycardia, tachypnea, leukocytosis, AKI with pneumonia POA. Sepsis physiology resolved -Managementas above.   NSTEMI/history of CAD/stent in 2005: Troponin 3153 -> 3506 -> 2927 -> 2894.  EKG with T wave changes in lateral leads but no dynamic changes.  TTE with LVEF of 55 to 60%, G1-DD and no RWMA chest pain-free.  -Cardiology following -Completed 48 hours of IV heparin and transition back to home Eliquis. -Medical management due to poor candidacy for heart cath in the setting of respiratory failure -Cardiology recommends outpatient follow-up when stable from respiratory standpoint.  Chronic diastolic CHF: TTE as above.  Appears euvolemic on exam.  Does not seem to be on diuretics at home.  About 1.6 L UOP/24 hours. -Monitor fluid status   AKI/azotemia on CKD-3A: Improved.  Does not seem to be on nephrotoxic meds.  Could be hemodynamically mediated Recent Labs    05/11/21 0555 05/12/21 0530 05/13/21 0515 05/14/21 0706 05/15/21 0445 05/16/21 0603 05/17/21 0548 05/18/21 0620 05/19/21 0457 05/20/21 0500  BUN 42* 48* 41* 33* 33* 41* 46* 40* 41* 33*  CREATININE 1.39* 1.32* 1.22 1.13 1.15 1.41* 1.40* 1.16 1.16 1.09  -Continue monitoring -Avoid nephrotoxic meds.  Hypotension: Resolved.   Stage IV lung cancer s/p radiation and chemotherapy previously.   -Outpatient follow-up - oncology following    Anemia of chronic disease:  H&H stable after 1 unit Recent Labs    05/09/21 0701 05/10/21 0517 05/11/21 0555 05/12/21 0530 05/15/21 0445 05/16/21 0603 05/17/21 0548 05/18/21 0620 05/19/21 0457 05/20/21 0500  HGB 8.1* 7.6* 9.3* 9.8* 10.3* 11.0* 10.2* 10.2* 9.6* 9.8*  -Continue monitoring   Chronic pain syndrome: -Continue oxycodone and bowel regimen as needed.   Depression and Anxiety: Stable -Continue with Fluoxetine 40 g p.o. daily as well as Olanzapine 10 mg p.o. nightly   Hx of PE: CTA chest negative for PE. -Continue home Eliquis  Physical deconditioning-uses rolling walker at baseline. -Therapy recommends SNF  Leukocytosis/bandemia-likely from steroid.  Goal of care counseling-patient with comorbidity as above.  Very frail and deconditioned.  Poor long-term prognosis.  Appropriately DNR/DNI.  A candidate for hospice -Palliative medicine consulted.  Body mass index is 22.77 kg/m.        DVT prophylaxis:   apixaban (ELIQUIS) tablet 5 mg  Code Status: DNR/DNI Family Communication: Patient and/or RN. Available if any question.  Level of care: Progressive Cardiac Status is: Inpatient  Remains inpatient appropriate because: Due to significant oxygen requirement, work of breathing and need for IV medications and close monitoring Patient is currently on high flow nasal cannula 40 L, FiO2 45%    Consultants:  Cardiology Pulmonology Palliative medicine   Sch  Meds:  Scheduled Meds:  apixaban  5 mg Oral BID   atorvastatin  40 mg Oral Daily   budesonide (PULMICORT) nebulizer solution  0.5 mg Nebulization BID   Chlorhexidine Gluconate Cloth  6 each Topical Daily   feeding supplement  237 mL Oral TID BM   FLUoxetine  40 mg Oral Daily   fluticasone furoate-vilanterol  1 puff Inhalation Daily   guaiFENesin  1,200 mg Oral BID   ipratropium-albuterol  3 mL Nebulization Q6H   OLANZapine  10 mg Oral QHS   predniSONE  40 mg Oral Q breakfast   sulfamethoxazole-trimethoprim  1 tablet Oral Once  per day on Mon Wed Fri   Continuous Infusions:  sodium chloride 10 mL/hr (05/13/21 1620)   PRN Meds:.sodium chloride, acetaminophen, ALPRAZolam, alum & mag hydroxide-simeth, docusate sodium, guaiFENesin-dextromethorphan, ondansetron (ZOFRAN) IV, ondansetron, oxyCODONE, polyethylene glycol, sodium chloride  Antimicrobials: Anti-infectives (From admission, onward)    Start     Dose/Rate Route Frequency Ordered Stop   05/17/21 0900  sulfamethoxazole-trimethoprim (BACTRIM DS) 800-160 MG per tablet 1 tablet        1 tablet Oral Once per day on Mon Wed Fri 05/16/21 0935     05/13/21 1030  ceFEPIme (MAXIPIME) 2 g in sodium chloride 0.9 % 100 mL IVPB        2 g 200 mL/hr over 30 Minutes Intravenous Every 12 hours 05/13/21 0952 05/13/21 1959   05/11/21 1630  azithromycin (ZITHROMAX) 500 mg in sodium chloride 0.9 % 250 mL IVPB        500 mg 250 mL/hr over 60 Minutes Intravenous Every 24 hours 05/11/21 1537 05/13/21 1721   05/08/21 1800  vancomycin (VANCOREADY) IVPB 750 mg/150 mL  Status:  Discontinued        750 mg 150 mL/hr over 60 Minutes Intravenous Every 24 hours 05/08/21 1135 05/09/21 1448   05/08/21 0800  ceFEPIme (MAXIPIME) 2 g in sodium chloride 0.9 % 100 mL IVPB  Status:  Discontinued        2 g 200 mL/hr over 30 Minutes Intravenous Every 12 hours 05/07/21 1929 05/13/21 0952   05/07/21 2200  ceFEPIme (MAXIPIME) 2 g in sodium chloride 0.9 % 100 mL IVPB  Status:  Discontinued        2 g 200 mL/hr over 30 Minutes Intravenous Every 12 hours 05/07/21 1927 05/07/21 1929   05/07/21 1930  vancomycin (VANCOREADY) IVPB 500 mg/100 mL        500 mg 100 mL/hr over 60 Minutes Intravenous  Once 05/07/21 1927 05/07/21 2144   05/07/21 1927  vancomycin variable dose per unstable renal function (pharmacist dosing)  Status:  Discontinued         Does not apply See admin instructions 05/07/21 1927 05/09/21 1448   05/07/21 1600  vancomycin (VANCOCIN) IVPB 1000 mg/200 mL premix        1,000 mg 200 mL/hr  over 60 Minutes Intravenous  Once 05/07/21 1552 05/07/21 1801   05/07/21 1600  ceFEPIme (MAXIPIME) 2 g in sodium chloride 0.9 % 100 mL IVPB        2 g 200 mL/hr over 30 Minutes Intravenous  Once 05/07/21 1552 05/07/21 1700        I have personally reviewed the following labs and images: CBC: Recent Labs  Lab 05/16/21 0603 05/17/21 0548 05/18/21 0620 05/19/21 0457 05/20/21 0500  WBC 17.5* 14.9* 15.2* 12.4* 11.8*  HGB 11.0* 10.2* 10.2* 9.6* 9.8*  HCT 33.6* 31.4* 31.5* 30.3* 31.5*  MCV 96.3 94.0  95.2 94.7 96.3  PLT 196 186 184 175 193   BMP &GFR Recent Labs  Lab 05/14/21 0706 05/15/21 0445 05/16/21 0603 05/17/21 0548 05/18/21 0620 05/19/21 0457 05/20/21 0500  NA 143 140 139 135 137 136 139  K 4.0 4.1 3.8 3.9 4.6 4.6 4.5  CL 104 102 99 94* 97* 96* 98  CO2 32 34* 34* 35* 33* 32 35*  GLUCOSE 96 109* 129* 154* 105* 95 104*  BUN 33* 33* 41* 46* 40* 41* 33*  CREATININE 1.13 1.15 1.41* 1.40* 1.16 1.16 1.09  CALCIUM 9.0 8.5* 8.7* 8.5* 8.8* 8.9 9.0  MG 2.1 2.1 1.9 2.0  --   --   --   PHOS  --  3.6 4.5 4.1  --   --   --    Estimated Creatinine Clearance: 56.9 mL/min (by C-G formula based on SCr of 1.09 mg/dL). Liver & Pancreas: No results for input(s): AST, ALT, ALKPHOS, BILITOT, PROT, ALBUMIN in the last 168 hours.  No results for input(s): LIPASE, AMYLASE in the last 168 hours. No results for input(s): AMMONIA in the last 168 hours. Diabetic: No results for input(s): HGBA1C in the last 72 hours. No results for input(s): GLUCAP in the last 168 hours.  Cardiac Enzymes: No results for input(s): CKTOTAL, CKMB, CKMBINDEX, TROPONINI in the last 168 hours. No results for input(s): PROBNP in the last 8760 hours. Coagulation Profile: No results for input(s): INR, PROTIME in the last 168 hours.  Thyroid Function Tests: No results for input(s): TSH, T4TOTAL, FREET4, T3FREE, THYROIDAB in the last 72 hours. Lipid Profile: No results for input(s): CHOL, HDL, LDLCALC, TRIG,  CHOLHDL, LDLDIRECT in the last 72 hours.  Anemia Panel: No results for input(s): VITAMINB12, FOLATE, FERRITIN, TIBC, IRON, RETICCTPCT in the last 72 hours.  Urine analysis:    Component Value Date/Time   COLORURINE YELLOW (A) 05/07/2021 2044   APPEARANCEUR CLEAR (A) 05/07/2021 2044   LABSPEC 1.021 05/07/2021 2044   PHURINE 6.0 05/07/2021 2044   GLUCOSEU NEGATIVE 05/07/2021 2044   HGBUR NEGATIVE 05/07/2021 2044   BILIRUBINUR NEGATIVE 05/07/2021 2044   [place] 05/07/2021 2044   PROTEINUR NEGATIVE 05/07/2021 2044   NITRITE NEGATIVE 05/07/2021 2044   LEUKOCYTESUR NEGATIVE 05/07/2021 2044   Sepsis Labs: Invalid input(s): PROCALCITONIN, [place]  Microbiology: No results found for this or any previous visit (from the past 240 hour(s)).   Radiology Studies: No results found.     Val Riles, MD [facility]  If 7PM-7AM, please contact night-coverage [email or web address] 05/20/2021, 5:47 PM

## 2021-05-20 NOTE — Progress Notes (Signed)
Physical Therapy Treatment Patient Details Name: Jimmy West MRN: [id number] DOB: Nov 28, 1950 Today's Date: 05/20/2021   History of Present Illness 70 y.o. male was brought to hosp to admit 10/18 for hypoxia, weakness, SOB and noted sepsis from PNA, as well as NSTEMI.  Troponins are high from demand ischemia, EF 40-45%, and has recent asp PNA per chart.  PMHx:  R lower lobe stage IV adenocarcinoma of the lung, chronic anemia, anxiety, depression, presence of Port-A-Cath, cad s/p PCI with stent placement in 2005, tobacco abuse quit in 2005    PT Comments    Pt was pleasant and motivated to participate during the session and put forth good effort throughout. Pt was able to complete graded ther ex in bed with SpO2 staying between 90-92% and HR 105-106. EOB hip flexion x10 caused SpO2 to drop to 86% with HR 115, but SpO2 recovered in < 60 secs. Pt was able to ambulate to chair with min guard, but SpO2 dropped to 79%, although recovered in < 60 secs. Gait training is deferred till next session due to SpO2 responses during activity. Pt will benefit from PT services in a SNF setting upon discharge to safely address deficits listed in patient problem list for decreased caregiver assistance and eventual return to PLOF.     Recommendations for follow up therapy are one component of a multi-disciplinary discharge planning process, led by the attending physician.  Recommendations may be updated based on patient status, additional functional criteria and insurance authorization.  Follow Up Recommendations  Skilled nursing-short term rehab (<3 hours/day)     Assistance Recommended at Discharge Intermittent Supervision/Assistance  Equipment Recommendations  None recommended by PT    Recommendations for Other Services       Precautions / Restrictions Precautions Precautions: Fall Precaution Comments: monitor sats and HR Restrictions Weight Bearing Restrictions: No Other Position/Activity  Restrictions: EOB ther ex causedSpO2 to drop to 79% and HR to increase to 115     Mobility  Bed Mobility Overal bed mobility: Modified Independent Bed Mobility: Supine to Sit     Supine to sit: Modified independent (Device/Increase time)     General bed mobility comments: Increased time and effort    Transfers Overall transfer level: Needs assistance Equipment used: Rolling walker (2 wheels) Transfers: Sit to/from Stand Sit to Stand: Min guard           General transfer comment: CGA, increased time and effort    Ambulation/Gait Ambulation/Gait assistance: Min guard Gait Distance (Feet): 3 Feet Assistive device: Rolling walker (2 wheels) Gait Pattern/deviations: Decreased stride length;Narrow base of support;Step-through pattern;Decreased step length - right;Decreased step length - left Gait velocity: decreased   General Gait Details: additional ambulation deferred due to O2 dipping to 79%   Stairs             Wheelchair Mobility    Modified Rankin (Stroke Patients Only)       Balance Overall balance assessment: Needs assistance Sitting-balance support: Feet supported;Bilateral upper extremity supported Sitting balance-Leahy Scale: Fair     Standing balance support: Bilateral upper extremity supported;During functional activity Standing balance-Leahy Scale: Good                              Cognition Arousal/Alertness: Awake/alert Behavior During Therapy: WFL for tasks assessed/performed Overall Cognitive Status: Within Functional Limits for tasks assessed  Exercises Total Joint Exercises Ankle Circles/Pumps: AROM;Strengthening;Both;10 reps;Supine Quad Sets: AROM;Strengthening;Both;10 reps;Supine Gluteal Sets: AROM;Strengthening;Both;10 reps;Supine Hip ABduction/ADduction: AROM;Strengthening;Both;10 reps;Supine Straight Leg Raises: AROM;Strengthening;Both;10  reps;Supine General Exercises - Lower Extremity Hip Flexion/Marching: AROM;Strengthening;Both;10 reps;Seated (SpO2 dropped to 86 and HR increased to 115)    General Comments        Pertinent Vitals/Pain Pain Assessment: 0-10 Pain Score: 5  Pain Location: Spine, 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 Pain Descriptors / Indicators: Aching;Discomfort;Sore Pain Intervention(s): Repositioned    Home Living                          Prior Function            PT Goals (current goals can now be found in the care plan section) Acute Rehab PT Goals Patient Stated Goal: to go home PT Goal Formulation: With patient Time For Goal Achievement: 05/25/21 Potential to Achieve Goals: Good    Frequency    Min 2X/week      PT Plan Current plan remains appropriate    Co-evaluation              AM-PAC PT "6 Clicks" Mobility   Outcome Measure  Help needed turning from your back to your side while in a flat bed without using bedrails?: None Help needed moving from lying on your back to sitting on the side of a flat bed without using bedrails?: None Help needed moving to and from a bed to a chair (including a wheelchair)?: A Little Help needed standing up from a chair using your arms (e.g., wheelchair or bedside chair)?: A Little Help needed to walk in hospital room?: A Lot Help needed climbing 3-5 steps with a railing? : Total 6 Click Score: 17    End of Session Equipment Utilized During Treatment: Gait belt Activity Tolerance: Patient tolerated treatment well;Treatment limited secondary to medical complications (Comment) (SpO2 dropped to 79% after minimal ambulation) Patient left: in chair;with call bell/phone within reach;with chair alarm set Nurse Communication: Mobility status (SpO2 dropped to 79%, but increased to >/= 88% in less than 60 seconds) PT Visit Diagnosis: Muscle weakness (generalized) (M62.81);Difficulty in walking, not elsewhere classified (R26.2)     Time:  7517-0017 PT Time Calculation (min) (ACUTE ONLY): 39 min  Charges:                        Sheldon Silvan SPT 05/20/21, 2:00 PM

## 2021-05-20 NOTE — Progress Notes (Signed)
[facility]) Hospital Liaison note:  This patient is currently enrolled in [facility] outpatient-based Palliative Care. Will continue to follow for disposition.  Please call with any outpatient palliative questions or concerns.  Thank you, Lorelee Market, LPN [facility] Liaison [phone]

## 2021-05-20 NOTE — Telephone Encounter (Signed)
Dr.B- pls advise on treatment follow-up. Pt will be d/c from [facility] soon. Thanks.

## 2021-05-20 NOTE — Telephone Encounter (Signed)
Reviewed pt's chart and is currently admitted.

## 2021-05-20 NOTE — Care Management Important Message (Signed)
Important Message  Patient Details  Name: Jimmy West MRN: [id number] Date of Birth: 16-Nov-1950   Medicare Important Message Given:  Yes     Dannette Barbara 05/20/2021, 3:28 PM

## 2021-05-20 NOTE — Telephone Encounter (Signed)
Pt requests refill of oxycodone and xanax at this time. States that will be discharging from the hospital soon and will need refill once discharged. At this time refill is due for oxycodone but not xanax. Xanax last filled on 10/10 with 30 day supply prior to pt's admission on 10/18. Due to admission, pt should have about 22 days left on his current prescription. Left message with pt to make aware and instructed to call back with any further questions or needs.

## 2021-05-21 ENCOUNTER — Inpatient Hospital Stay: Payer: Medicare HMO

## 2021-05-21 ENCOUNTER — Inpatient Hospital Stay: Payer: Medicare HMO | Attending: Internal Medicine

## 2021-05-21 ENCOUNTER — Inpatient Hospital Stay: Payer: Medicare HMO | Admitting: Internal Medicine

## 2021-05-21 DIAGNOSIS — R918 Other nonspecific abnormal finding of lung field: Secondary | ICD-10-CM | POA: Diagnosis not present

## 2021-05-21 DIAGNOSIS — D6481 Anemia due to antineoplastic chemotherapy: Secondary | ICD-10-CM | POA: Diagnosis not present

## 2021-05-21 DIAGNOSIS — C3431 Malignant neoplasm of lower lobe, right bronchus or lung: Secondary | ICD-10-CM | POA: Diagnosis not present

## 2021-05-21 DIAGNOSIS — J9621 Acute and chronic respiratory failure with hypoxia: Secondary | ICD-10-CM | POA: Diagnosis not present

## 2021-05-21 LAB — BASIC METABOLIC PANEL
Anion gap: 7 (ref 5–15)
BUN: 33 mg/dL — ABNORMAL HIGH (ref 8–23)
CO2: 34 mmol/L — ABNORMAL HIGH (ref 22–32)
Calcium: 9.2 mg/dL (ref 8.9–10.3)
Chloride: 96 mmol/L — ABNORMAL LOW (ref 98–111)
Creatinine, Ser: 1.07 mg/dL (ref 0.61–1.24)
GFR, Estimated: >60 mL/min (ref >60)
Glucose, Bld: 91 mg/dL (ref 70–99)
Potassium: 4.5 mmol/L (ref 3.5–5.1)
Sodium: 137 mmol/L (ref 135–145)

## 2021-05-21 LAB — CBC
HCT: 31.7 % — ABNORMAL LOW (ref 39.0–52.0)
Hemoglobin: 10 g/dL — ABNORMAL LOW (ref 13.0–17.0)
MCH: 30.4 pg (ref 26.0–34.0)
MCHC: 31.5 g/dL (ref 30.0–36.0)
MCV: 96.4 fL (ref 80.0–100.0)
Platelets: 191 10*3/uL (ref 150–400)
RBC: 3.29 MIL/uL — ABNORMAL LOW (ref 4.22–5.81)
RDW: 18.1 % — ABNORMAL HIGH (ref 11.5–15.5)
WBC: 10.9 10*3/uL — ABNORMAL HIGH (ref 4.0–10.5)
nRBC: 0 % (ref 0.0–0.2)

## 2021-05-21 LAB — MAGNESIUM: Magnesium: 1.8 mg/dL (ref 1.7–2.4)

## 2021-05-21 LAB — GLUCOSE, CAPILLARY: Glucose-Capillary: 159 mg/dL — ABNORMAL HIGH (ref 70–99)

## 2021-05-21 LAB — PHOSPHORUS: Phosphorus: 4 mg/dL (ref 2.5–4.6)

## 2021-05-21 MED ORDER — SODIUM CHLORIDE 0.9 % IV SOLN
5.0000 mg/kg | Freq: Once | INTRAVENOUS | Status: DC
  Filled 2021-05-21: qty 30

## 2021-05-21 MED ORDER — ARFORMOTEROL TARTRATE 15 MCG/2ML IN NEBU
15.0000 ug | INHALATION_SOLUTION | Freq: Two times a day (BID) | RESPIRATORY_TRACT | Status: DC
  Administered 2021-05-21 – 2021-06-12 (×41): 15 ug via RESPIRATORY_TRACT
  Filled 2021-05-21 (×50): qty 2

## 2021-05-21 MED ORDER — REVEFENACIN 175 MCG/3ML IN SOLN
175.0000 ug | Freq: Every day | RESPIRATORY_TRACT | Status: DC
  Administered 2021-05-21 – 2021-06-12 (×23): 175 ug via RESPIRATORY_TRACT
  Filled 2021-05-21 (×25): qty 3

## 2021-05-21 MED ORDER — PREDNISONE 20 MG PO TABS
30.0000 mg | ORAL_TABLET | Freq: Every day | ORAL | Status: DC
  Administered 2021-05-22 – 2021-06-09 (×19): 30 mg via ORAL
  Filled 2021-05-21 (×21): qty 1

## 2021-05-21 MED ORDER — SODIUM CHLORIDE 0.9 % IV SOLN
5.0000 mg/kg | Freq: Once | INTRAVENOUS | Status: AC
  Administered 2021-05-22: 300 mg via INTRAVENOUS
  Filled 2021-05-21: qty 30

## 2021-05-21 NOTE — Progress Notes (Signed)
NAME:  Jimmy West, MRN:  [id number], DOB:  1950-08-27, LOS: 39 ADMISSION DATE:  05/07/2021, INITIAL CONSULTATION DATE: 05/08/2021 REFERRING MD: Dr. Billie Ruddy ; follow-up requested by Dr. Dwyane Dee CHIEF COMPLAINT: Shortness of breath, bilateral infiltrates.  Follow-up requested from initial consultation on 08 May 2021.  Persistent respiratory failure with hypoxia.  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 Patient confirms DNR/DNI code status today and was followed by palliative care team during previous admission. He has one living brother who lives in [place] and has not spoken to him in years. His preferred contact person is [name] (information in patient records). Patient lives alone and does not have assistance at home.    Patient presented to the ER via EMS last night 10/18 with chief complaint of shortness of breath. Patient reports that he has not felt well since his hospital discharge with profound weakness, too weak to eat, and worsening shortness of breath. He reports that he started wearing his home oxygen all of the time due to not feeling well which helped his pulmonary symptoms some. He states he was not using his prescribed inhaler because the pump was malfunctioning on the handheld device. He also describes wheezing and dry, nonproductive cough which have worsened in the last several days. He denies recent fever, chills, sweating, nausea, vomiting, swelling. He reports small volume loose stools occasionally which is not his  routine bowel movements.    On arrival to the ER, patient with noted oxygen saturations in the 80s on room air by EMS with blood pressure systolic in the 01X after a 200cc fluid bolus. Vital signs on arrival pulse 97, BP 103/74, RR 36, oxygen saturations 91% on 6L, afebrile. Patient noted to easily desaturate with activity, speaking, and any interruption to his oxygen flow such as when he was transitioned to 8L high flow nasal cannula with humidifier. Labs noted WBC 16.6, hgb 9.9, Creatinine 1.71, BNP 1105, troponin 3153 and 3506 on repeat. COVID and flu screening negative. Imaging: CXR findings of new left lower lobe airspace process suspicious for pneumonia with extensive right lower lobe density consistent with radiation change. D-dimer was elevated, and CTA of the chest was done which was negative for PE. Patient had sepsis bundle initiated and received 2 liters LR fluid resuscitation, empiric vancomycin and cefepime in the ER. He was started on a heparin continuous infusion for NSTEMI suspected demand ischemia on history of CAD with remote PCI.    PCCM was consulted to assess patient. He is currently being seen in the ER pending bed placement inpatient, tolerating 8 liters highflow oxygen cannula at oxygen saturations 92-93%; however, he has noted increased work of breathing with accessory muscles of breathing use, shortness of breath worsening with conversation with short shallow breaths between words. Patient is alert, oriented to self, location, situation and time. He denies any other recent events other than what is described above. He has never used a BiPAP or other NIPPV support device before but is agreeable to trial on BiPAP for a short duration if his respiratory condition worsens. He confirms DNI status as part of  DNR and that he would not want intubation to support his breathing.   Hospital course from 19 October till now: Patient has presented with acute on chronic respiratory failure with  hypoxia with CT imaging with extensive differential diagnosis.  He has been treated as "worsening pneumonia" with increasing FiO2 requirements in the process.  He is currently on steroids.  Had initial improvement on symptoms however, now has seemed to plateau.  We are asked to render opinion as to how long steroids will be necessary.  He is currently requiring high flow O2 at 60%.  Pertinent  Medical History   Stage IV adenocarcinoma of the lung Myocardial infarction in the past Remote history of PE on Eliquis CAD hx PCI 2005 Chronic anemia secondary to cancer treatment Port-a-cath right chest Recently discharged 04/28/21 after treatment of aspiration pneumonia  Significant Hospital Events: Including procedures, antibiotic start and stop dates in addition to other pertinent events   10/18: Brought by EMS to ER with cc: sob, hypoxic sats 80% on RA and hypotensive. CXR concerning for LUL pna, sepsis with NSTEMI demand ischemia on workup. Started on cefepime, vancomycin, heparin continuous infusion and assigned to hospitalist service pending bed placement.  10/19: Evaluated by PCCM significant pulm history with recurrent pneumonia ? Aspiration versus HCAP, sepsis, NSTEMI, hypotension. Stable sats on 8lpm highflow oxygen, with increased WOB and accessory muscle use.  10/26: PCCM asked to reevaluate for determination of length of steroid therapy 10/26: CT chest with no significant change 10/27: Still requiring high flow O2 60%, 40 L flow 10/28: O2 requirements significantly decreased down to 45 % at 35 L flow 11/01: Has plateaued in O2 requirements, decrease prednisone to 30 mg daily  Interim History / Subjective:  Continues to feel that he is improving.  Oxygen requirement down today.  Difficulty bringing secretions up.  No conversational dyspnea noted today.  Objective   Blood pressure 108/68, pulse 98, temperature 98.1 F (36.7 C), resp. rate 18, height 5' 6"  (1.676 m), weight 64 kg, SpO2 92  %.    SpO2: 92 % O2 Flow Rate (L/min): 40 L/min FiO2 (%): 45 %  Intake/Output Summary (Last 24 hours) at 05/21/2021 1452 Last data filed at 05/21/2021 1333 Gross per 24 hour  Intake 720 ml  Output 2425 ml  Net -1705 ml    Filed Weights   05/07/21 1439 05/10/21 0901 05/10/21 1537  Weight: 58.4 kg 63.2 kg 64 kg    Examination: GENERAL: Debilitated appearing gentleman, sitting up in chair, on high flow O2 via nasal cannula, no conversational dyspnea noted today.  More interactive. HEAD: Normocephalic, atraumatic.  EYES: Pupils equal, round, reactive to light.  No scleral icterus.  MOUTH: Oral mucosa moist. NECK: Supple. No thyromegaly. Trachea midline. No JVD.  No adenopathy. PULMONARY: Good air entry bilaterally.  Coarse throughout, crackles at bases.  Diffuse wheezes noted. CARDIOVASCULAR: S1 and S2. Regular rate and rhythm.  No rubs, murmurs or gallops heard. ABDOMEN: Scaphoid, otherwise benign. MUSCULOSKELETAL: No joint deformity, no clubbing, no edema.  Diffuse muscle wasting. NEUROLOGIC: No overt focal deficit, gait not tested.   SKIN: Intact,warm,dry. PSYCH: Clear mentation today.  Mood and behavior normal.  Resolved Hospital Problem list   N/A  Assessment & Plan:  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 DDX: Pemetrexed/Pembrolizumab toxicity (grade 3), lymphangitic spread of CA (not entirely ruled out) - Oxygen supplementation to maintain  O2 sats 88-92% - Heated high flow for now taper as tolerated  - Brovana with Pulmicort twice a day - Yupelri once daily - Continue prednisone at 30 mg daily (starting in a.m.) - PJP prophylaxis Bactrim DS Monday Wednesday Friday - Will need taper of steroids over 4 to 6-week period of time - Procalcitonin's have been negative, check KL6, pending - Fungitell negative -  Immunoglobulins pending - Inflammatory markers are elevated (ESR/CRP) LDH 239 - Repeated CT chest, persistent interstitial and airspace process with no significant change - Appears to have plateaued clinically - Trial of infliximab 5 mg/kg x 1, repeat in 2 weeks if improvement noted   Pneumonia ruled out Procalcitonin is normal Persistent findings on CT with differential as above -No need for antibiotics at present except for PJP prophylaxis -Aspiration precautions -Leukocytosis likely due to steroids, WBC 10.9 today   ACUTE KIDNEY INJURY/Renal Failure - Avoid nephrotoxic agents as able - Follow urine output, BMP - Monitor closely on PJP prophylaxis - Ensure adequate renal perfusion, optimize oxygenation  Anemia, chronic secondary to cancer treatment - stable - Monitor routine CBC   Elevated d-dimer  History PE on Eliquis - CTA Chest 10/18 negative for PE - Continue Eliquis   Radiologic studies  Representative image from CT angio chest obtained 07 May 2021:  Interstitial and airspace process in both lungs  Representative image from CT chest obtained  26 October:   Persistent interstitial process   Labs   CBC: Recent Labs  Lab 05/17/21 0548 05/18/21 0620 05/19/21 0457 05/20/21 0500 05/21/21 0512  WBC 14.9* 15.2* 12.4* 11.8* 10.9*  HGB 10.2* 10.2* 9.6* 9.8* 10.0*  HCT 31.4* 31.5* 30.3* 31.5* 31.7*  MCV 94.0 95.2 94.7 96.3 96.4  PLT 186 184 175 193 191     Basic Metabolic Panel: Recent Labs  Lab 05/15/21 0445 05/16/21 0603 05/17/21 0548 05/18/21 0620 05/19/21 0457 05/20/21 0500 05/21/21 0512  NA 140 139 135 137 136 139 137  K 4.1 3.8 3.9 4.6 4.6 4.5 4.5  CL 102 99 94* 97* 96* 98 96*  CO2 34* 34* 35* 33* 32 35* 34*  GLUCOSE 109* 129* 154* 105* 95 104* 91  BUN 33* 41* 46* 40* 41* 33* 33*  CREATININE 1.15 1.41* 1.40* 1.16 1.16 1.09 1.07  CALCIUM 8.5* 8.7* 8.5* 8.8* 8.9 9.0 9.2  MG 2.1 1.9 2.0  --   --   --  1.8  PHOS 3.6 4.5 4.1  --   --   --   4.0    GFR: Estimated Creatinine Clearance: 58 mL/min (by C-G formula based on SCr of 1.07 mg/dL). Recent Labs  Lab 05/18/21 0620 05/19/21 0457 05/20/21 0500 05/21/21 0512  WBC 15.2* 12.4* 11.8* 10.9*     Liver Function Tests: No results for input(s): AST, ALT, ALKPHOS, BILITOT, PROT, ALBUMIN in the last 168 hours.  No results for input(s): LIPASE, AMYLASE in the last 168 hours. No results for input(s): AMMONIA in the last 168 hours.  ABG    Component Value Date/Time   PHART 7.51 (H) 05/14/2021 1459   PCO2ART 38 05/14/2021 1459   PO2ART 51 (L) 05/14/2021 1459   HCO3 30.3 (H) 05/14/2021 1459   O2SAT 89.4 05/14/2021 1459     CBG: No results for input(s): GLUCAP in the last 168 hours.   Review of Systems:   A 10 point review of systems was performed and it is as noted above otherwise negative.  Allergies No Known Allergies   Scheduled Meds:  apixaban  5 mg Oral BID   arformoterol  15 mcg Nebulization BID   atorvastatin  40 mg Oral Daily   budesonide (PULMICORT) nebulizer solution  0.5 mg Nebulization BID   Chlorhexidine Gluconate Cloth  6 each Topical Daily   feeding supplement  237 mL Oral TID BM   FLUoxetine  40 mg Oral Daily   guaiFENesin  1,200 mg Oral BID   OLANZapine  10 mg Oral QHS   [START ON 05/22/2021] predniSONE  30 mg Oral Q breakfast   revefenacin  175 mcg Nebulization Daily   sulfamethoxazole-trimethoprim  1 tablet Oral Once per day on Mon Wed Fri   Continuous Infusions:  sodium chloride 10 mL/hr (05/13/21 1620)   PRN Meds:.sodium chloride, acetaminophen, ALPRAZolam, alum & mag hydroxide-simeth, docusate sodium, ondansetron (ZOFRAN) IV, ondansetron, oxyCODONE, polyethylene glycol, sodium chloride   Level 2 follow-up    Discussion: Patient appears to have more of an interstitial process that is failing to improve this is either 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   KL- 6 has been shown useful in determining ILD from other modalities, however this may take a few weeks to come back. Continue prednisone at 30 mg daily, adding tomorrow.  He will need a 4 to 6-week slow taper.  Continue PJP prophylaxis (MWF).  We will challenge with infliximab 5 mg/kg x 1 decreasing steroids for infliximab challenge.  Have optimized nebulization treatments.  Will continue to follow along with you.  Renold Don, MD Advanced Bronchoscopy PCCM [facility]    *This note was dictated using voice recognition software/Dragon.  Despite best efforts to proofread, errors can occur which can change the meaning.  Any change was purely unintentional.

## 2021-05-21 NOTE — Progress Notes (Signed)
PROGRESS NOTE  Jimmy West ZOX:[id number] DOB: Jun 15, 1951   PCP: Pcp, No  Patient is from: Home.  Uses rolling walker at baseline.  DOA: 05/07/2021 LOS: 60  Chief complaints:  Chief Complaint  Patient presents with   Shortness of Breath     Brief Narrative / Interim history: 70 year old M with PMH of stage IV RLL lung cancer, cancer pain, CAD/stent in 2005, chronic respiratory failure with hypoxia on 2 L, anemia of chronic disease, anxiety, depression and debility presenting with generalized weakness, shortness of breath and dry cough, and admitted for acute on chronic hypoxic respiratory failure in the setting of LLL pneumonia and non-STEMI.  Started on broad-spectrum antibiotics, steroid and IV heparin.  Cardiology consulted recommended medical management and [facility] outpatient.  CTA chest negative for PE.  10/25 patient became hypoxic and required directed oxygen, started high flow nasal cannula, FiO2 54%, inpatient setting around 90s  11/1: Patient continued to require heated high flow at 40L and 45% FiO2, clinically seems stable.  Pulmonology is following.  No obvious infection.  Completed the course of antibiotics.  Currently procalcitonin negative.  Per pulmonary note differential can be Pemetrexed/Pembrolizumab toxicity (grade 3), lymphangitic spread of CA (not entirely ruled out).  Will require long taper of steroid. They are also recommending trial of infliximab.  Subjective: Patient was seen and examined today.  Eating lunch.  No new complaints.  Stating that he is feeling much better.  Remained on heated high flow.   Objective: Vitals:   05/20/21 1200 05/20/21 1317 05/20/21 1333 05/20/21 1551  BP: 116/65   91/71  Pulse: 98   87  Resp: 18   17  Temp: 98.1 F (36.7 C)   98.3 F (36.8 C)  TempSrc:      SpO2: 92% 90% 91% 96%  Weight:      Height:        Intake/Output Summary (Last 24 hours) at 05/20/2021 1747 Last data filed at 05/20/2021 1047 Gross per  24 hour  Intake 480 ml  Output 2700 ml  Net -2220 ml   Filed Weights   05/07/21 1439 05/10/21 0901 05/10/21 1537  Weight: 58.4 kg 63.2 kg 64 kg    Examination:  General.  Frail elderly man, in no acute distress. Pulmonary.  Coarse crackles bilaterally, normal respiratory effort. CV.  Regular rate and rhythm, no JVD, rub or murmur. Abdomen.  Soft, nontender, nondistended, BS positive. CNS.  Alert and oriented .  No focal neurologic deficit. Extremities.  No edema, no cyanosis, pulses intact and symmetrical. Psychiatry.  Judgment and insight appears normal.   Procedures:  None  Microbiology summarized: COVID-19 and influenza PCR nonreactive. MRSA PCR screen negative. Blood cultures NGTD.  Assessment & Plan: Acute on chronic respiratory failure with hypoxia due to LLL pneumonia with superimposed stage IV RLL lung cancer-on 2 L at baseline.  Currently requiring 40 L with heated high flow.  CTA chest negative for PE but concern for LLL pneumonia with RLL scarring from known lung cancer.   Blood cultures negative. On cefepime since 10/18 making today day 7. S/p vancomycin. Oxygen requirement seems stable at this time - s/p Cefepime completed 7 days. S/p azithromycin 500 x 3 doses  - check CXR to eval for parapneumonic effusion which would require extended course abx - stop solumedrol, start oral prednisone, plan to wean -Wean oxygen as able. -IS/OOB/PT/OT - palliative consulted - Dr. B of oncology aware of the patient -Pulmonology was also consulted and there is a concern  of lymphangitis spread versus drug toxicity. -Pulmonary is recommending prolonged taper of steroid. -They are also recommending infliximab and repeat in 2 weeks if improvement  Severe sepsis due to pneumonia:tachycardia, tachypnea, leukocytosis, AKI with pneumonia POA. Sepsis physiology resolved -Managementas above.   NSTEMI/history of CAD/stent in 2005: Troponin 3153 -> 3506 -> 2927 -> 2894.  EKG with T wave  changes in lateral leads but no dynamic changes.  TTE with LVEF of 55 to 60%, G1-DD and no RWMA chest pain-free.  -Completed 48 hours of IV heparin and transition back to home Eliquis. -Medical management due to poor candidacy for heart cath in the setting of respiratory failure -Cardiology recommends outpatient follow-up when stable from respiratory standpoint.  Chronic diastolic CHF: TTE as above.  Appears euvolemic on exam.  Does not seem to be on diuretics at home.  About 1.6 L UOP/24 hours. -Monitor fluid status   AKI/azotemia on CKD-3A: Improved.  Does not seem to be on nephrotoxic meds.  Could be hemodynamically mediated Recent Labs    05/11/21 0555 05/12/21 0530 05/13/21 0515 05/14/21 0706 05/15/21 0445 05/16/21 0603 05/17/21 0548 05/18/21 0620 05/19/21 0457 05/20/21 0500  BUN 42* 48* 41* 33* 33* 41* 46* 40* 41* 33*  CREATININE 1.39* 1.32* 1.22 1.13 1.15 1.41* 1.40* 1.16 1.16 1.09  -Continue monitoring -Avoid nephrotoxic meds.  Hypotension: Resolved.   Stage IV lung cancer s/p radiation and chemotherapy previously.   -Outpatient follow-up - oncology following    Anemia of chronic disease: H&H stable after 1 unit Recent Labs    05/09/21 0701 05/10/21 0517 05/11/21 0555 05/12/21 0530 05/15/21 0445 05/16/21 0603 05/17/21 0548 05/18/21 0620 05/19/21 0457 05/20/21 0500  HGB 8.1* 7.6* 9.3* 9.8* 10.3* 11.0* 10.2* 10.2* 9.6* 9.8*  -Continue monitoring   Chronic pain syndrome: -Continue oxycodone and bowel regimen as needed.   Depression and Anxiety: Stable -Continue with Fluoxetine 40 g p.o. daily as well as Olanzapine 10 mg p.o. nightly   Hx of PE: CTA chest negative for PE. -Continue home Eliquis  Physical deconditioning-uses rolling walker at baseline. -Therapy recommends SNF  Leukocytosis/bandemia-likely from steroid.  Goal of care counseling-patient with comorbidity as above.  Very frail and deconditioned.  Poor long-term prognosis.  Appropriately  DNR/DNI.  A candidate for hospice -Palliative medicine consulted.  Body mass index is 22.77 kg/m.    DVT prophylaxis:  apixaban (ELIQUIS) tablet 5 mg  Code Status: DNR/DNI Family Communication:    Level of care: Progressive Cardiac Status is: Inpatient  Remains inpatient appropriate because: Due to significant oxygen requirement, work of breathing and need for IV medications and close monitoring Patient is currently on high flow nasal cannula 40 L, FiO2 45%    Consultants:  Cardiology Pulmonology Palliative medicine   Sch Meds:  Scheduled Meds:  apixaban  5 mg Oral BID   atorvastatin  40 mg Oral Daily   budesonide (PULMICORT) nebulizer solution  0.5 mg Nebulization BID   Chlorhexidine Gluconate Cloth  6 each Topical Daily   feeding supplement  237 mL Oral TID BM   FLUoxetine  40 mg Oral Daily   fluticasone furoate-vilanterol  1 puff Inhalation Daily   guaiFENesin  1,200 mg Oral BID   ipratropium-albuterol  3 mL Nebulization Q6H   OLANZapine  10 mg Oral QHS   predniSONE  40 mg Oral Q breakfast   sulfamethoxazole-trimethoprim  1 tablet Oral Once per day on Mon Wed Fri   Continuous Infusions:  sodium chloride 10 mL/hr (05/13/21 1620)   PRN Meds:.sodium  chloride, acetaminophen, ALPRAZolam, alum & mag hydroxide-simeth, docusate sodium, guaiFENesin-dextromethorphan, ondansetron (ZOFRAN) IV, ondansetron, oxyCODONE, polyethylene glycol, sodium chloride  Antimicrobials: Anti-infectives (From admission, onward)    Start     Dose/Rate Route Frequency Ordered Stop   05/17/21 0900  sulfamethoxazole-trimethoprim (BACTRIM DS) 800-160 MG per tablet 1 tablet        1 tablet Oral Once per day on Mon Wed Fri 05/16/21 0935     05/13/21 1030  ceFEPIme (MAXIPIME) 2 g in sodium chloride 0.9 % 100 mL IVPB        2 g 200 mL/hr over 30 Minutes Intravenous Every 12 hours 05/13/21 0952 05/13/21 1959   05/11/21 1630  azithromycin (ZITHROMAX) 500 mg in sodium chloride 0.9 % 250 mL IVPB         500 mg 250 mL/hr over 60 Minutes Intravenous Every 24 hours 05/11/21 1537 05/13/21 1721   05/08/21 1800  vancomycin (VANCOREADY) IVPB 750 mg/150 mL  Status:  Discontinued        750 mg 150 mL/hr over 60 Minutes Intravenous Every 24 hours 05/08/21 1135 05/09/21 1448   05/08/21 0800  ceFEPIme (MAXIPIME) 2 g in sodium chloride 0.9 % 100 mL IVPB  Status:  Discontinued        2 g 200 mL/hr over 30 Minutes Intravenous Every 12 hours 05/07/21 1929 05/13/21 0952   05/07/21 2200  ceFEPIme (MAXIPIME) 2 g in sodium chloride 0.9 % 100 mL IVPB  Status:  Discontinued        2 g 200 mL/hr over 30 Minutes Intravenous Every 12 hours 05/07/21 1927 05/07/21 1929   05/07/21 1930  vancomycin (VANCOREADY) IVPB 500 mg/100 mL        500 mg 100 mL/hr over 60 Minutes Intravenous  Once 05/07/21 1927 05/07/21 2144   05/07/21 1927  vancomycin variable dose per unstable renal function (pharmacist dosing)  Status:  Discontinued         Does not apply See admin instructions 05/07/21 1927 05/09/21 1448   05/07/21 1600  vancomycin (VANCOCIN) IVPB 1000 mg/200 mL premix        1,000 mg 200 mL/hr over 60 Minutes Intravenous  Once 05/07/21 1552 05/07/21 1801   05/07/21 1600  ceFEPIme (MAXIPIME) 2 g in sodium chloride 0.9 % 100 mL IVPB        2 g 200 mL/hr over 30 Minutes Intravenous  Once 05/07/21 1552 05/07/21 1700        I have personally reviewed the following labs and images: CBC: Recent Labs  Lab 05/16/21 0603 05/17/21 0548 05/18/21 0620 05/19/21 0457 05/20/21 0500  WBC 17.5* 14.9* 15.2* 12.4* 11.8*  HGB 11.0* 10.2* 10.2* 9.6* 9.8*  HCT 33.6* 31.4* 31.5* 30.3* 31.5*  MCV 96.3 94.0 95.2 94.7 96.3  PLT 196 186 184 175 193   BMP &GFR Recent Labs  Lab 05/14/21 0706 05/15/21 0445 05/16/21 0603 05/17/21 0548 05/18/21 0620 05/19/21 0457 05/20/21 0500  NA 143 140 139 135 137 136 139  K 4.0 4.1 3.8 3.9 4.6 4.6 4.5  CL 104 102 99 94* 97* 96* 98  CO2 32 34* 34* 35* 33* 32 35*  GLUCOSE 96 109* 129* 154*  105* 95 104*  BUN 33* 33* 41* 46* 40* 41* 33*  CREATININE 1.13 1.15 1.41* 1.40* 1.16 1.16 1.09  CALCIUM 9.0 8.5* 8.7* 8.5* 8.8* 8.9 9.0  MG 2.1 2.1 1.9 2.0  --   --   --   PHOS  --  3.6 4.5 4.1  --   --   --  Estimated Creatinine Clearance: 56.9 mL/min (by C-G formula based on SCr of 1.09 mg/dL). Liver & Pancreas: No results for input(s): AST, ALT, ALKPHOS, BILITOT, PROT, ALBUMIN in the last 168 hours.  No results for input(s): LIPASE, AMYLASE in the last 168 hours. No results for input(s): AMMONIA in the last 168 hours. Diabetic: No results for input(s): HGBA1C in the last 72 hours. No results for input(s): GLUCAP in the last 168 hours.  Cardiac Enzymes: No results for input(s): CKTOTAL, CKMB, CKMBINDEX, TROPONINI in the last 168 hours. No results for input(s): PROBNP in the last 8760 hours. Coagulation Profile: No results for input(s): INR, PROTIME in the last 168 hours.  Thyroid Function Tests: No results for input(s): TSH, T4TOTAL, FREET4, T3FREE, THYROIDAB in the last 72 hours. Lipid Profile: No results for input(s): CHOL, HDL, LDLCALC, TRIG, CHOLHDL, LDLDIRECT in the last 72 hours.  Anemia Panel: No results for input(s): VITAMINB12, FOLATE, FERRITIN, TIBC, IRON, RETICCTPCT in the last 72 hours.  Urine analysis:    Component Value Date/Time   COLORURINE YELLOW (A) 05/07/2021 2044   APPEARANCEUR CLEAR (A) 05/07/2021 2044   LABSPEC 1.021 05/07/2021 2044   PHURINE 6.0 05/07/2021 2044   GLUCOSEU NEGATIVE 05/07/2021 2044   HGBUR NEGATIVE 05/07/2021 2044   BILIRUBINUR NEGATIVE 05/07/2021 2044   [place] 05/07/2021 2044   PROTEINUR NEGATIVE 05/07/2021 2044   NITRITE NEGATIVE 05/07/2021 2044   LEUKOCYTESUR NEGATIVE 05/07/2021 2044   Sepsis Labs: Invalid input(s): PROCALCITONIN, [place]  Microbiology: No results found for this or any previous visit (from the past 240 hour(s)).   Radiology Studies: No results found.     Lorella Nimrod, MD [facility]  If 7PM-7AM, please contact night-coverage [email or web address] 05/20/2021, 5:47 PM

## 2021-05-21 NOTE — Telephone Encounter (Signed)
Reviewed patients chart, he is currently admitted

## 2021-05-21 NOTE — Progress Notes (Signed)
Occupational Therapy Treatment Patient Details Name: Jimmy West MRN: [id number] DOB: [birth year] Today's Date: 05/21/2021   History of present illness 70 y.o. male was brought to hosp to admit 10/18 for hypoxia, weakness, SOB and noted sepsis from PNA, as well as NSTEMI.  Troponins are high from demand ischemia, EF 40-45%, and has recent asp PNA per chart.  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 Pt endorses feeling "okay" but having more coughing spells today. Pt on 40L O2, 45% FiO2. Required CGA/HHA for sup>sit EOB, tolerating sitting EOB for education for ~30min with limited increase in chronic back pain and SpO2 89-91% on 40L heated HFNC, HR 108-111. Pt instructed in energy conservation strategies and how to incorporate into daily ADL routines such as grooming/shaving his face, showering, and dressing, activity pacing with rest breaks, use of PLB to minimize SOB/panic response, and AE/DME to minimize chronic back pain and also support EC. Pt verbalized understanding. Pt politely declined to get to the recliner or to wash up today stating, "I just don't feel like it today." Pt continues to benefit from skilled OT Services to support return to PLOF. Continue to recommend STR at this time. However, anticipate with medical improvement in O2 requirements, pt may be able to return home with supports in place. Will continue to assess as pt progresses.    Recommendations for follow up therapy are one component of a multi-disciplinary discharge planning process, led by the attending physician.  Recommendations may be updated based on patient status, additional functional criteria and insurance authorization.    Follow Up Recommendations  Skilled nursing-short term rehab (<3 hours/day)    Assistance  Recommended at Discharge Intermittent Supervision/Assistance  Equipment Recommendations  Other (comment);Tub/shower seat (pulse ox)    Recommendations for Other Services      Precautions / Restrictions Precautions Precautions: Fall Precaution Comments: monitor sats and HR Restrictions Weight Bearing Restrictions: No       Mobility Bed Mobility Overal bed mobility: Needs Assistance Bed Mobility: Supine to Sit     Supine to sit: Min guard Sit to supine: Modified independent (Device/Increase time)   General bed mobility comments: handheld assist for trunk elevation briefly    Transfers Overall transfer level: Needs assistance   Transfers: Lateral/Scoot Transfers            Lateral/Scoot Transfers: Supervision       Balance Overall balance assessment: Needs assistance Sitting-balance support: Feet supported;Bilateral upper extremity supported;Single extremity supported Sitting balance-Leahy Scale: Fair Sitting balance - Comments: intermittent UE vs BUE support on EOB   Standing balance support: Bilateral upper extremity supported;During functional activity                               ADL either performed or assessed with clinical judgement   ADL Overall ADL's : Needs assistance/impaired                                       General ADL Comments: continue to emphasize use of ECS into ADL to support mod independence and decreased SOB/over exertion     Vision       Perception  Praxis      Cognition Arousal/Alertness: Awake/alert Behavior During Therapy: WFL for tasks assessed/performed Overall Cognitive Status: Within Functional Limits for tasks assessed                                            Exercises Other Exercises Other Exercises: Pt instructed in energy conservation strategies and how to incorporate into daily ADL routines such as grooming/shaving his face, showering, and dressing, activity  pacing with rest breaks, use of PLB to minimize SOB/panic response, and AE/DME to minimize chronic back pain and also support EC.   Shoulder Instructions       General Comments HHFC at 40L O2, 45% FiO2, SpO2 89-91% with bed mobility, HR 108-111.    Pertinent Vitals/ Pain       Pain Location: pt does not provide a number but states his back "always hurts" but reports being "okay" Pain Intervention(s): Limited activity within patient's tolerance;Monitored during session;Repositioned  Home Living                                          Prior Functioning/Environment              Frequency  Min 2X/week        Progress Toward Goals  OT Goals(current goals can now be found in the care plan section)  Progress towards OT goals: Progressing toward goals;OT to reassess next treatment  Acute Rehab OT Goals OT Goal Formulation: With patient Time For Goal Achievement: 05/25/21 Potential to Achieve Goals: Good  Plan Discharge plan remains appropriate;Frequency remains appropriate    Co-evaluation                 AM-PAC OT "6 Clicks" Daily Activity     Outcome Measure   Help from another person eating meals?: None Help from another person taking care of personal grooming?: A Little Help from another person toileting, which includes using toliet, bedpan, or urinal?: A Little Help from another person bathing (including washing, rinsing, drying)?: A Lot Help from another person to put on and taking off regular upper body clothing?: A Little Help from another person to put on and taking off regular lower body clothing?: A Lot 6 Click Score: 17    End of Session Equipment Utilized During Treatment: Oxygen  OT Visit Diagnosis: Other abnormalities of gait and mobility (R26.89);Muscle weakness (generalized) (M62.81)   Activity Tolerance Patient tolerated treatment well   Patient Left in bed;with call bell/phone within reach;with bed alarm set   Nurse  Communication          Time: 7322-0254 OT Time Calculation (min): 26 min  Charges: OT General Charges $OT Visit: 1 Visit OT Treatments $Self Care/Home Management : 23-37 mins  Ardeth Perfect., MPH, MS, OTR/L ascom [phone] 05/21/21, 3:10 PM

## 2021-05-21 NOTE — Progress Notes (Signed)
Jimmy West Twist   DOB:11-Aug-1950   ZT#:[id number]    Subjective: Patient resting in the chair.  He continues to be on high flow nasal cannula oxygen at 60%.  Unable to taper further.  Shortness of breath on minimal exertion.  Objective:  Vitals:   05/21/21 1949 05/21/21 2001  BP: 106/70   Pulse: 98   Resp: 18   Temp: 97.9 F (36.6 C)   SpO2: 97% 93%     Intake/Output Summary (Last 24 hours) at 05/21/2021 2044 Last data filed at 05/21/2021 1900 Gross per 24 hour  Intake 720 ml  Output 2425 ml  Net -1705 ml    Physical Exam Vitals and nursing note reviewed.  HENT:     Head: Normocephalic and atraumatic.     Mouth/Throat:     Pharynx: Oropharynx is clear.  Eyes:     Extraocular Movements: Extraocular movements intact.     Pupils: Pupils are equal, round, and reactive to light.  Cardiovascular:     Rate and Rhythm: Normal rate and regular rhythm.  Pulmonary:     Comments: Decreased breath sounds bilaterally.  Abdominal:     Palpations: Abdomen is soft.  Musculoskeletal:        General: Normal range of motion.     Cervical back: Normal range of motion.  Skin:    General: Skin is warm.  Neurological:     General: No focal deficit present.     Mental Status: He is alert and oriented to person, place, and time.  Psychiatric:        Behavior: Behavior normal.        Judgment: Judgment normal.     Labs:  Lab Results  Component Value Date   WBC 10.9 (H) 05/21/2021   HGB 10.0 (L) 05/21/2021   HCT 31.7 (L) 05/21/2021   MCV 96.4 05/21/2021   PLT 191 05/21/2021   NEUTROABS 14.0 (H) 05/11/2021    Lab Results  Component Value Date   NA 137 05/21/2021   K 4.5 05/21/2021   CL 96 (L) 05/21/2021   CO2 34 (H) 05/21/2021    Studies:  No results found.  Primary cancer of right lower lobe of lung [facility]) #70 year old male patient history of COPD/chronic respiratory failure-metastatic lung cancer currently on immunotherapy is currently admitted hospital for worsening  respiratory distress/diagnosed with pneumonia.  #Right lower lobe lung cancer-adenocarcinoma currently on immunotherapy/Keytruda  #Acute on chronic respiratory failure / bilateral lower lobe pneumonitis-immunotherapy versus lymphangitic spread versus infection.  Currently on prednisone as per pulmonary.  Patient will need up to 4 to 6 weeks of slow taper.  Patient started on infliximab today-after ruling out any fungal infections.  Appreciate pulmonary recommendations.  Discussed with Dr. Patsey Berthold.   Cammie Sickle, MD 05/21/2021  8:44 PM

## 2021-05-22 DIAGNOSIS — J9621 Acute and chronic respiratory failure with hypoxia: Secondary | ICD-10-CM | POA: Diagnosis not present

## 2021-05-22 DIAGNOSIS — R918 Other nonspecific abnormal finding of lung field: Secondary | ICD-10-CM | POA: Diagnosis not present

## 2021-05-22 LAB — BASIC METABOLIC PANEL
Anion gap: 6 (ref 5–15)
BUN: 33 mg/dL — ABNORMAL HIGH (ref 8–23)
CO2: 34 mmol/L — ABNORMAL HIGH (ref 22–32)
Calcium: 9.1 mg/dL (ref 8.9–10.3)
Chloride: 101 mmol/L (ref 98–111)
Creatinine, Ser: 0.94 mg/dL (ref 0.61–1.24)
GFR, Estimated: >60 mL/min (ref >60)
Glucose, Bld: 88 mg/dL (ref 70–99)
Potassium: 4.4 mmol/L (ref 3.5–5.1)
Sodium: 141 mmol/L (ref 135–145)

## 2021-05-22 LAB — CBC
HCT: 30 % — ABNORMAL LOW (ref 39.0–52.0)
Hemoglobin: 9.6 g/dL — ABNORMAL LOW (ref 13.0–17.0)
MCH: 30.5 pg (ref 26.0–34.0)
MCHC: 32 g/dL (ref 30.0–36.0)
MCV: 95.2 fL (ref 80.0–100.0)
Platelets: 178 10*3/uL (ref 150–400)
RBC: 3.15 MIL/uL — ABNORMAL LOW (ref 4.22–5.81)
RDW: 18 % — ABNORMAL HIGH (ref 11.5–15.5)
WBC: 10.5 10*3/uL (ref 4.0–10.5)
nRBC: 0 % (ref 0.0–0.2)

## 2021-05-22 NOTE — Progress Notes (Signed)
NAME:  Jimmy West, MRN:  [id number], DOB:  1951/06/19, LOS: 15 ADMISSION DATE:  05/07/2021, INITIAL CONSULTATION DATE: 05/08/2021 REFERRING MD: Dr. Billie Ruddy ; follow-up requested by Dr. Dwyane Dee CHIEF COMPLAINT: Shortness of breath, bilateral infiltrates.  Follow-up requested from initial consultation on 08 May 2021.  Persistent respiratory failure with hypoxia.  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 Patient confirms DNR/DNI code status today and was followed by palliative care team during previous admission. He has one living brother who lives in [place] and has not spoken to him in years. His preferred contact person is [name] (information in patient records). Patient lives alone and does not have assistance at home.    Patient presented to the ER via EMS last night 10/18 with chief complaint of shortness of breath. Patient reports that he has not felt well since his hospital discharge with profound weakness, too weak to eat, and worsening shortness of breath. He reports that he started wearing his home oxygen all of the time due to not feeling well which helped his pulmonary symptoms some. He states he was not using his prescribed inhaler because the pump was malfunctioning on the handheld device. He also describes wheezing and dry, nonproductive cough which have worsened in the last several days. He denies recent fever, chills, sweating, nausea, vomiting, swelling. He reports small volume loose stools occasionally which is not his  routine bowel movements.    On arrival to the ER, patient with noted oxygen saturations in the 80s on room air by EMS with blood pressure systolic in the 78G after a 200cc fluid bolus. Vital signs on arrival pulse 97, BP 103/74, RR 36, oxygen saturations 91% on 6L, afebrile. Patient noted to easily desaturate with activity, speaking, and any interruption to his oxygen flow such as when he was transitioned to 8L high flow nasal cannula with humidifier. Labs noted WBC 16.6, hgb 9.9, Creatinine 1.71, BNP 1105, troponin 3153 and 3506 on repeat. COVID and flu screening negative. Imaging: CXR findings of new left lower lobe airspace process suspicious for pneumonia with extensive right lower lobe density consistent with radiation change. D-dimer was elevated, and CTA of the chest was done which was negative for PE. Patient had sepsis bundle initiated and received 2 liters LR fluid resuscitation, empiric vancomycin and cefepime in the ER. He was started on a heparin continuous infusion for NSTEMI suspected demand ischemia on history of CAD with remote PCI.    PCCM was consulted to assess patient. He is currently being seen in the ER pending bed placement inpatient, tolerating 8 liters highflow oxygen cannula at oxygen saturations 92-93%; however, he has noted increased work of breathing with accessory muscles of breathing use, shortness of breath worsening with conversation with short shallow breaths between words. Patient is alert, oriented to self, location, situation and time. He denies any other recent events other than what is described above. He has never used a BiPAP or other NIPPV support device before but is agreeable to trial on BiPAP for a short duration if his respiratory condition worsens. He confirms DNI status as part of  DNR and that he would not want intubation to support his breathing.   Hospital course from 19 October till now: Patient has presented with acute on chronic respiratory failure with  hypoxia with CT imaging with extensive differential diagnosis.  He has been treated as "worsening pneumonia" with increasing FiO2 requirements in the process.  He is currently on steroids.  Had initial improvement on symptoms however, now has seemed to plateau.  We are asked to render opinion as to how long steroids will be necessary.  He is currently requiring high flow O2 at 60%.  Pertinent  Medical History   Stage IV adenocarcinoma of the lung Myocardial infarction in the past Remote history of PE on Eliquis CAD hx PCI 2005 Chronic anemia secondary to cancer treatment Port-a-cath right chest Recently discharged 04/28/21 after treatment of aspiration pneumonia  Significant Hospital Events: Including procedures, antibiotic start and stop dates in addition to other pertinent events   10/18: Brought by EMS to ER with cc: sob, hypoxic sats 80% on RA and hypotensive. CXR concerning for LUL pna, sepsis with NSTEMI demand ischemia on workup. Started on cefepime, vancomycin, heparin continuous infusion and assigned to hospitalist service pending bed placement.  10/19: Evaluated by PCCM significant pulm history with recurrent pneumonia ? Aspiration versus HCAP, sepsis, NSTEMI, hypotension. Stable sats on 8lpm highflow oxygen, with increased WOB and accessory muscle use.  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  Oxygen requirement remains same as yesterday.  Feels that he is moving secretions better.  No conversational dyspnea.  States he feels "stronger"  Objective   Blood  pressure 117/71, pulse 99, temperature 98.4 F (36.9 C), temperature source Oral, resp. rate 20, height $RemoveBe'5\' 6"'qJzcxqeKv$  (1.676 m), weight 64 kg, SpO2 93 %.    SpO2: 93 % O2 Flow Rate (L/min): 40 L/min FiO2 (%): 45 %  Intake/Output Summary (Last 24 hours) at 05/22/2021 1618 Last data filed at 05/22/2021 1100 Gross per 24 hour  Intake 480 ml  Output 2225 ml  Net -1745 ml    Filed Weights   05/07/21 1439 05/10/21 0901 05/10/21 1537  Weight: 58.4 kg 63.2 kg 64 kg    Examination: GENERAL: Debilitated appearing gentleman, sitting up in chair, on high flow O2 via nasal cannula, no conversational dyspnea noted today.  More interactive. HEAD: Normocephalic, atraumatic.  EYES: Pupils equal, round, reactive to light.  No scleral icterus.  MOUTH: Oral mucosa moist. NECK: Supple. No thyromegaly. Trachea midline. No JVD.  No adenopathy. PULMONARY: Good air entry bilaterally.  Coarse throughout, crackles at bases.  No wheezes noted today. CARDIOVASCULAR: S1 and S2. Regular rate and rhythm.  No rubs, murmurs or gallops heard. ABDOMEN: Scaphoid, otherwise benign. MUSCULOSKELETAL: No joint deformity, no clubbing, no edema.  Diffuse muscle wasting. NEUROLOGIC: No overt focal deficit, gait not tested.   SKIN: Intact,warm,dry. PSYCH: Conversant.  Mood and behavior normal.  Resolved Hospital Problem list   N/A  Assessment & Plan:  Acute on chronic hypoxic respiratory failure Interstitial and airspace process both lungs Recent hospitalization for aspiration pneumonia (DC 04/28/21) Stage IV adenocarcinoma  of the lung with mets DDX: Pemetrexed/Pembrolizumab toxicity (grade 3), lymphangitic spread of CA (not entirely ruled out) -Titrate oxygen supplementation to maintain O2 sats 88-92% - Heated high flow for now, taper as tolerated  -Continue Brovana with Pulmicort twice a day -Continue Yupelri once daily - Continue prednisone at 30 mg daily (starting today) - PJP prophylaxis Bactrim DS Monday Wednesday  Friday - Will need taper of steroids over 4 to 6-week period of time - Procalcitonin's have been negative, KL6, pending - Fungitell negative - Immunoglobulins pending - Inflammatory markers are elevated (ESR/CRP) LDH 239 - Repeated CT chest 10/26, persistent interstitial and airspace process with no significant change - Appears to have plateaued clinically - Trial of infliximab 5 mg/kg x 1, first dose today, repeat in 2 weeks if improvement noted   Pneumonia ruled out Procalcitonin is normal Persistent findings on CT with differential as above -No need for antibiotics at present except for PJP prophylaxis -Aspiration precautions -Leukocytosis likely due to steroids, WBC 10.9 today   ACUTE KIDNEY INJURY/Renal Failure - Avoid nephrotoxic agents as able - Follow urine output, BMP - Monitor closely on PJP prophylaxis - Ensure adequate renal perfusion, optimize oxygenation  Anemia, chronic secondary to cancer treatment - stable - Monitor routine CBC   Elevated d-dimer  History PE on Eliquis - CTA Chest 10/18 negative for PE - High risk for PE recurrence due to underlying malignancy - Continue Eliquis    Radiologic studies  Representative image from CT angio chest obtained 07 May 2021:  Interstitial and airspace process in both lungs  Representative image from CT chest obtained  26 October:   Persistent interstitial process   Labs   CBC: Recent Labs  Lab 05/18/21 0620 05/19/21 0457 05/20/21 0500 05/21/21 0512 05/22/21 0625  WBC 15.2* 12.4* 11.8* 10.9* 10.5  HGB 10.2* 9.6* 9.8* 10.0* 9.6*  HCT 31.5* 30.3* 31.5* 31.7* 30.0*  MCV 95.2 94.7 96.3 96.4 95.2  PLT 184 175 193 191 178     Basic Metabolic Panel: Recent Labs  Lab 05/16/21 0603 05/17/21 0548 05/18/21 0620 05/19/21 0457 05/20/21 0500 05/21/21 0512 05/22/21 0625  NA 139 135 137 136 139 137 141  K 3.8 3.9 4.6 4.6 4.5 4.5 4.4  CL 99 94* 97* 96* 98 96* 101  CO2 34* 35* 33* 32 35* 34* 34*   GLUCOSE 129* 154* 105* 95 104* 91 88  BUN 41* 46* 40* 41* 33* 33* 33*  CREATININE 1.41* 1.40* 1.16 1.16 1.09 1.07 0.94  CALCIUM 8.7* 8.5* 8.8* 8.9 9.0 9.2 9.1  MG 1.9 2.0  --   --   --  1.8  --   PHOS 4.5 4.1  --   --   --  4.0  --     GFR: Estimated Creatinine Clearance: 66 mL/min (by C-G formula based on SCr of 0.94 mg/dL). Recent Labs  Lab 05/19/21 0457 05/20/21 0500 05/21/21 0512 05/22/21 0625  WBC 12.4* 11.8* 10.9* 10.5     Liver Function Tests: No results for input(s): AST, ALT, ALKPHOS, BILITOT, PROT, ALBUMIN in the last 168 hours.  No results for input(s): LIPASE, AMYLASE in the last 168 hours. No results for input(s): AMMONIA in the last 168 hours.  ABG    Component Value Date/Time   PHART 7.51 (H) 05/14/2021 1459   PCO2ART 38 05/14/2021 1459   PO2ART 51 (L) 05/14/2021 1459   HCO3 30.3 (H) 05/14/2021 1459   O2SAT 89.4 05/14/2021 1459     CBG: Recent Labs  Lab 05/21/21 1606  GLUCAP 159*     Review of Systems:   A 10 point review of systems was performed and it is as noted above otherwise negative.  Allergies No Known Allergies   Scheduled Meds:  apixaban  5 mg Oral BID   arformoterol  15 mcg Nebulization BID   atorvastatin  40 mg Oral Daily   budesonide (PULMICORT) nebulizer solution  0.5 mg Nebulization BID   Chlorhexidine Gluconate Cloth  6 each Topical Daily   feeding supplement  237 mL Oral TID BM   FLUoxetine  40 mg Oral Daily   guaiFENesin  1,200 mg Oral BID   OLANZapine  10 mg Oral QHS   predniSONE  30 mg Oral Q breakfast   revefenacin  175 mcg Nebulization Daily   sulfamethoxazole-trimethoprim  1 tablet Oral Once per day on Mon Wed Fri   Continuous Infusions:  sodium chloride 10 mL/hr (05/13/21 1620)   PRN Meds:.sodium chloride, acetaminophen, ALPRAZolam, alum & mag hydroxide-simeth, docusate sodium, ondansetron (ZOFRAN) IV, ondansetron, oxyCODONE, polyethylene glycol, sodium chloride Received infliximab x1 today  Level 2  follow-up    Discussion: Patient appears to have more of an interstitial process that is failing to improve this is either 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  KL- 6 has been shown useful in determining ILD from other modalities, however this may take a few weeks to come back. Continue prednisone at decreased dose of 30 mg daily, starting today.  He will need a 4 to 6-week slow taper.  Continue PJP prophylaxis (MWF).  Challenged with infliximab 5 mg/kg x 1 today.  Decreasing steroids for infliximab challenge.  Continue nebulization treatments and pulmonary hygiene.  Will continue to follow along with you.  Renold Don, MD Advanced Bronchoscopy PCCM [facility]    *This note was dictated using voice recognition software/Dragon.  Despite best efforts to proofread, errors can occur which can change the meaning.  Any change was purely unintentional.

## 2021-05-22 NOTE — TOC Progression Note (Signed)
Transition of Care [facility]) - Progression Note    Patient Details  Name: Jimmy West MRN: [id number] Date of Birth: [birth year]  [facility] CM/SW Snow Saman Giddens, [place] Phone Number: 05/22/2021, 2:41 PM  Clinical Narrative:     Patient continues with HFNC 40 L/min, unable to go to SNF until at 6L/min or below.   CSW spoke with Delsa Sale with [facility] who reports patient does not meet criteria due to no qualifying ICU stay.   Dispo uncertain at this time due to patient's high oxygen demands.    Expected Discharge Plan: Skilled Nursing Facility Barriers to Discharge: No Barriers Identified  Expected Discharge Plan and Services Expected Discharge Plan: [place] Choice: [place] arrangements for the past 2 months: Single Family Home                                       Social Determinants of Health (SDOH) Interventions    Readmission Risk Interventions Readmission Risk Prevention Plan 04/26/2021  Transportation Screening Complete  PCP or Specialist Appt within 3-5 Days Complete  HRI or [place] Complete  Social Work Consult for [place] Planning/Counseling Complete  Palliative Care Screening Not Applicable  Medication Review [employer] photographer) Complete  Some recent data might be hidden

## 2021-05-22 NOTE — Telephone Encounter (Signed)
Per Dr. Jacinto Reap- pt will continue to be in the hospital for a little longer. D/c planning TBD

## 2021-05-22 NOTE — Progress Notes (Signed)
Physical Therapy Treatment Patient Details Name: Jimmy West MRN: [id number] DOB: 03/15/1951 Today's Date: 05/22/2021   History of Present Illness 70 y.o. male was brought to hosp to admit 10/18 for hypoxia, weakness, SOB and noted sepsis from PNA, as well as NSTEMI.  Troponins are high from demand ischemia, EF 40-45%, and has recent asp PNA per chart.  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 SpO2 was 86% and HR 113 upon entering the room. With pursed lip breathing, SpO2 increased to 89% after 2-3 mins and nursing was notified. Pt completed ther ex supine in bed with SpO2 remaining between 85-86%. SpO2 dropped down to 84% at the lowest and HR increased to 120 with graded exercises, but was not experiencing any other symptoms. Pt will benefit from PT services in a SNF setting upon discharge to safely address deficits listed in patient problem list for decreased caregiver assistance and eventual return to PLOF.    Recommendations for follow up therapy are one component of a multi-disciplinary discharge planning process, led by the attending physician.  Recommendations may be updated based on patient status, additional functional criteria and insurance authorization.  Follow Up Recommendations  Skilled nursing-short term rehab (<3 hours/day)     Assistance Recommended at Discharge Intermittent Supervision/Assistance  Equipment Recommendations  None recommended by PT    Recommendations for Other Services       Precautions / Restrictions Precautions Precautions: Fall Precaution Comments: monitor sats and  HR Restrictions Weight Bearing Restrictions: No Other Position/Activity Restrictions: SpO2 upon entering the room was 86%     Mobility  Bed Mobility               General bed mobility comments: Pt declined bed mobility, transfers, and ambulation today due to activity earlier this morning     Transfers                        Ambulation/Gait                 Stairs             Wheelchair Mobility    Modified Rankin (Stroke Patients Only)       Balance                                            Cognition Arousal/Alertness: Awake/alert Behavior During Therapy: WFL for tasks assessed/performed Overall Cognitive Status: Within Functional Limits for tasks assessed                                          Exercises Total Joint Exercises Ankle Circles/Pumps: AROM;Strengthening;Both;10 reps;Supine Quad Sets: AROM;Strengthening;Both;10 reps;Supine Gluteal Sets: AROM;Strengthening;Both;10 reps;Supine Towel Squeeze: AROM;Strengthening;Both;10 reps;Supine Short Arc Quad: AROM;Strengthening;Both;10 reps;Supine Hip ABduction/ADduction: AROM;Strengthening;Both;10 reps;Supine General Exercises - Lower Extremity Hip Flexion/Marching: AROM;Both;10 reps;Supine Other Exercises Other Exercises: Pt education on preventing heel pressure  sores    General Comments        Pertinent Vitals/Pain Pain Assessment: 0-10 Pain Score: 7  Pain Descriptors / Indicators: Aching;Discomfort;Sore Pain Intervention(s): Limited activity within patient's tolerance    Home Living                          Prior Function            PT Goals (current goals can now be found in the care plan section) Acute Rehab PT Goals Patient Stated Goal: to go home PT Goal Formulation: With patient Time For Goal Achievement: 05/25/21 Potential to Achieve Goals: Good Progress towards PT goals: Progressing toward goals     Frequency    Min 2X/week      PT Plan Current plan remains appropriate    Co-evaluation              AM-PAC PT "6 Clicks" Mobility   Outcome Measure  Help needed turning from your back to your side while in a flat bed without using bedrails?: None Help needed moving from lying on your back to sitting on the side of a flat bed without using bedrails?: None Help needed moving to and from a bed to a chair (including a wheelchair)?: A Little Help needed standing up from a chair using your arms (e.g., wheelchair or bedside chair)?: A Little Help needed to walk in hospital room?: A Lot Help needed climbing 3-5 steps with a railing? : Total 6 Click Score: 17    End of Session   Activity Tolerance: Patient tolerated treatment well Patient left: in bed;with call bell/phone within reach;with bed alarm set;with nursing/sitter in room   PT Visit Diagnosis: Muscle weakness (generalized) (M62.81)     Time: 3704-8889 PT Time Calculation (min) (ACUTE ONLY): 20 min  Charges:                        Sheldon Silvan SPT 05/22/21, 11:32 AM

## 2021-05-22 NOTE — Progress Notes (Signed)
CCMD called this RN to notify that the patient experienced a 10 beat run of Ventricular Tachycardia. Pt asymptomatic. Dr. Lorella Nimrod made aware. No new orders.

## 2021-05-22 NOTE — Progress Notes (Signed)
PROGRESS NOTE  Jimmy West UXN:[id number] DOB: Oct 23, 1950   PCP: Pcp, No  Patient is from: Home.  Uses rolling walker at baseline.  DOA: 05/07/2021 LOS: 32  Chief complaints:  Chief Complaint  Patient presents with   Shortness of Breath     Brief Narrative / Interim history: 70 year old M with PMH of stage IV RLL lung cancer, cancer pain, CAD/stent in 2005, chronic respiratory failure with hypoxia on 2 L, anemia of chronic disease, anxiety, depression and debility presenting with generalized weakness, shortness of breath and dry cough, and admitted for acute on chronic hypoxic respiratory failure in the setting of LLL pneumonia and non-STEMI.  Started on broad-spectrum antibiotics, steroid and IV heparin.  Cardiology consulted recommended medical management and [place] outpatient.  CTA chest negative for PE.  10/25 patient became hypoxic and required directed oxygen, started high flow nasal cannula, FiO2 54%, inpatient setting around 90s  11/1: Patient continued to require heated high flow at 40L and 45% FiO2, clinically seems stable.  Pulmonology is following.  No obvious infection.  Completed the course of antibiotics.  Currently procalcitonin negative.  Per pulmonary note differential can be Pemetrexed/Pembrolizumab toxicity (grade 3), lymphangitic spread of CA (not entirely ruled out).  Will require long taper of steroid. They are also recommending trial of infliximab-patient received first dose on 05/22/2021  Subjective: Patient denies any chest pain or shortness of breath.  No new complaints.  Remained on heated high flow at 40 L.  Objective: Vitals:   05/20/21 1200 05/20/21 1317 05/20/21 1333 05/20/21 1551  BP: 116/65   91/71  Pulse: 98   87  Resp: 18   17  Temp: 98.1 F (36.7 C)   98.3 F (36.8 C)  TempSrc:      SpO2: 92% 90% 91% 96%  Weight:      Height:        Intake/Output Summary (Last 24 hours) at 05/20/2021 1747 Last data filed at 05/20/2021  1047 Gross per 24 hour  Intake 480 ml  Output 2700 ml  Net -2220 ml   Filed Weights   05/07/21 1439 05/10/21 0901 05/10/21 1537  Weight: 58.4 kg 63.2 kg 64 kg    Examination:  General.  Chronically ill-appearing, frail gentleman, in no acute distress. Pulmonary.  Coarse breath sounds bilaterally, normal respiratory effort. CV.  Regular rate and rhythm, no JVD, rub or murmur. Abdomen.  Soft, nontender, nondistended, BS positive. CNS.  Alert and oriented .  No focal neurologic deficit. Extremities.  No edema, no cyanosis, pulses intact and symmetrical. Psychiatry.  Judgment and insight appears normal.  Procedures:  None  Microbiology summarized: COVID-19 and influenza PCR nonreactive. MRSA PCR screen negative. Blood cultures NGTD.  Assessment & Plan: Acute on chronic respiratory failure with hypoxia due to LLL pneumonia with superimposed stage IV RLL lung cancer-on 2 L at baseline.  Currently requiring 40 L with heated high flow.  CTA chest negative for PE but concern for LLL pneumonia with RLL scarring from known lung cancer.   Blood cultures negative. On cefepime since 10/18 making today day 7. S/p vancomycin. Oxygen requirement seems stable at this time - s/p Cefepime completed 7 days. S/p azithromycin 500 x 3 doses  - check CXR to eval for parapneumonic effusion which would require extended course abx - stop solumedrol, start oral prednisone, plan to wean -Wean oxygen as able. -IS/OOB/PT/OT - palliative consulted - Dr. B of oncology aware of the patient -Pulmonology was also consulted and there is a concern  of lymphangitis spread versus drug toxicity. -Pulmonary is recommending prolonged taper of steroid. -Received first dose of infliximab and repeat in 2 weeks if improvement  Severe sepsis due to pneumonia:tachycardia, tachypnea, leukocytosis, AKI with pneumonia POA. Sepsis physiology resolved -Managementas above.   NSTEMI/history of CAD/stent in 2005: Troponin 3153 ->  3506 -> 2927 -> 2894.  EKG with T wave changes in lateral leads but no dynamic changes.  TTE with LVEF of 55 to 60%, G1-DD and no RWMA chest pain-free.  -Completed 48 hours of IV heparin and transition back to home Eliquis. -Medical management due to poor candidacy for heart cath in the setting of respiratory failure -Cardiology recommends outpatient follow-up when stable from respiratory standpoint.  Chronic diastolic CHF: TTE as above.  Appears euvolemic on exam.  Does not seem to be on diuretics at home.  About 1.6 L UOP/24 hours. -Monitor fluid status   AKI/azotemia on CKD-3A: Improved.  Does not seem to be on nephrotoxic meds.  Could be hemodynamically mediated Recent Labs    05/11/21 0555 05/12/21 0530 05/13/21 0515 05/14/21 0706 05/15/21 0445 05/16/21 0603 05/17/21 0548 05/18/21 0620 05/19/21 0457 05/20/21 0500  BUN 42* 48* 41* 33* 33* 41* 46* 40* 41* 33*  CREATININE 1.39* 1.32* 1.22 1.13 1.15 1.41* 1.40* 1.16 1.16 1.09  -Continue monitoring -Avoid nephrotoxic meds.  Hypotension: Resolved.   Stage IV lung cancer s/p radiation and chemotherapy previously.   -Outpatient follow-up - oncology following    Anemia of chronic disease: H&H stable after 1 unit Recent Labs    05/09/21 0701 05/10/21 0517 05/11/21 0555 05/12/21 0530 05/15/21 0445 05/16/21 0603 05/17/21 0548 05/18/21 0620 05/19/21 0457 05/20/21 0500  HGB 8.1* 7.6* 9.3* 9.8* 10.3* 11.0* 10.2* 10.2* 9.6* 9.8*  -Continue monitoring   Chronic pain syndrome: -Continue oxycodone and bowel regimen as needed.   Depression and Anxiety: Stable -Continue with Fluoxetine 40 g p.o. daily as well as Olanzapine 10 mg p.o. nightly   Hx of PE: CTA chest negative for PE. -Continue home Eliquis  Physical deconditioning-uses rolling walker at baseline. -Therapy recommends SNF  Leukocytosis/bandemia-likely from steroid.  Goal of care counseling-patient with comorbidity as above.  Very frail and deconditioned.  Poor  long-term prognosis.  Appropriately DNR/DNI.  A candidate for hospice -Palliative medicine consulted.  Body mass index is 22.77 kg/m.    DVT prophylaxis:  apixaban (ELIQUIS) tablet 5 mg  Code Status: DNR/DNI Family Communication:    Level of care: Progressive Cardiac Status is: Inpatient  Remains inpatient appropriate because: Due to significant oxygen requirement, work of breathing and need for IV medications and close monitoring Patient is currently on high flow nasal cannula 40 L, FiO2 45%    Consultants:  Cardiology Pulmonology Palliative medicine   Sch Meds:  Scheduled Meds:  apixaban  5 mg Oral BID   atorvastatin  40 mg Oral Daily   budesonide (PULMICORT) nebulizer solution  0.5 mg Nebulization BID   Chlorhexidine Gluconate Cloth  6 each Topical Daily   feeding supplement  237 mL Oral TID BM   FLUoxetine  40 mg Oral Daily   fluticasone furoate-vilanterol  1 puff Inhalation Daily   guaiFENesin  1,200 mg Oral BID   ipratropium-albuterol  3 mL Nebulization Q6H   OLANZapine  10 mg Oral QHS   predniSONE  40 mg Oral Q breakfast   sulfamethoxazole-trimethoprim  1 tablet Oral Once per day on Mon Wed Fri   Continuous Infusions:  sodium chloride 10 mL/hr (05/13/21 1620)   PRN Meds:.sodium  chloride, acetaminophen, ALPRAZolam, alum & mag hydroxide-simeth, docusate sodium, guaiFENesin-dextromethorphan, ondansetron (ZOFRAN) IV, ondansetron, oxyCODONE, polyethylene glycol, sodium chloride  Antimicrobials: Anti-infectives (From admission, onward)    Start     Dose/Rate Route Frequency Ordered Stop   05/17/21 0900  sulfamethoxazole-trimethoprim (BACTRIM DS) 800-160 MG per tablet 1 tablet        1 tablet Oral Once per day on Mon Wed Fri 05/16/21 0935     05/13/21 1030  ceFEPIme (MAXIPIME) 2 g in sodium chloride 0.9 % 100 mL IVPB        2 g 200 mL/hr over 30 Minutes Intravenous Every 12 hours 05/13/21 0952 05/13/21 1959   05/11/21 1630  azithromycin (ZITHROMAX) 500 mg in  sodium chloride 0.9 % 250 mL IVPB        500 mg 250 mL/hr over 60 Minutes Intravenous Every 24 hours 05/11/21 1537 05/13/21 1721   05/08/21 1800  vancomycin (VANCOREADY) IVPB 750 mg/150 mL  Status:  Discontinued        750 mg 150 mL/hr over 60 Minutes Intravenous Every 24 hours 05/08/21 1135 05/09/21 1448   05/08/21 0800  ceFEPIme (MAXIPIME) 2 g in sodium chloride 0.9 % 100 mL IVPB  Status:  Discontinued        2 g 200 mL/hr over 30 Minutes Intravenous Every 12 hours 05/07/21 1929 05/13/21 0952   05/07/21 2200  ceFEPIme (MAXIPIME) 2 g in sodium chloride 0.9 % 100 mL IVPB  Status:  Discontinued        2 g 200 mL/hr over 30 Minutes Intravenous Every 12 hours 05/07/21 1927 05/07/21 1929   05/07/21 1930  vancomycin (VANCOREADY) IVPB 500 mg/100 mL        500 mg 100 mL/hr over 60 Minutes Intravenous  Once 05/07/21 1927 05/07/21 2144   05/07/21 1927  vancomycin variable dose per unstable renal function (pharmacist dosing)  Status:  Discontinued         Does not apply See admin instructions 05/07/21 1927 05/09/21 1448   05/07/21 1600  vancomycin (VANCOCIN) IVPB 1000 mg/200 mL premix        1,000 mg 200 mL/hr over 60 Minutes Intravenous  Once 05/07/21 1552 05/07/21 1801   05/07/21 1600  ceFEPIme (MAXIPIME) 2 g in sodium chloride 0.9 % 100 mL IVPB        2 g 200 mL/hr over 30 Minutes Intravenous  Once 05/07/21 1552 05/07/21 1700        I have personally reviewed the following labs and images: CBC: Recent Labs  Lab 05/16/21 0603 05/17/21 0548 05/18/21 0620 05/19/21 0457 05/20/21 0500  WBC 17.5* 14.9* 15.2* 12.4* 11.8*  HGB 11.0* 10.2* 10.2* 9.6* 9.8*  HCT 33.6* 31.4* 31.5* 30.3* 31.5*  MCV 96.3 94.0 95.2 94.7 96.3  PLT 196 186 184 175 193   BMP &GFR Recent Labs  Lab 05/14/21 0706 05/15/21 0445 05/16/21 0603 05/17/21 0548 05/18/21 0620 05/19/21 0457 05/20/21 0500  NA 143 140 139 135 137 136 139  K 4.0 4.1 3.8 3.9 4.6 4.6 4.5  CL 104 102 99 94* 97* 96* 98  CO2 32 34* 34*  35* 33* 32 35*  GLUCOSE 96 109* 129* 154* 105* 95 104*  BUN 33* 33* 41* 46* 40* 41* 33*  CREATININE 1.13 1.15 1.41* 1.40* 1.16 1.16 1.09  CALCIUM 9.0 8.5* 8.7* 8.5* 8.8* 8.9 9.0  MG 2.1 2.1 1.9 2.0  --   --   --   PHOS  --  3.6 4.5 4.1  --   --   --  Estimated Creatinine Clearance: 56.9 mL/min (by C-G formula based on SCr of 1.09 mg/dL). Liver & Pancreas: No results for input(s): AST, ALT, ALKPHOS, BILITOT, PROT, ALBUMIN in the last 168 hours.  No results for input(s): LIPASE, AMYLASE in the last 168 hours. No results for input(s): AMMONIA in the last 168 hours. Diabetic: No results for input(s): HGBA1C in the last 72 hours. No results for input(s): GLUCAP in the last 168 hours.  Cardiac Enzymes: No results for input(s): CKTOTAL, CKMB, CKMBINDEX, TROPONINI in the last 168 hours. No results for input(s): PROBNP in the last 8760 hours. Coagulation Profile: No results for input(s): INR, PROTIME in the last 168 hours.  Thyroid Function Tests: No results for input(s): TSH, T4TOTAL, FREET4, T3FREE, THYROIDAB in the last 72 hours. Lipid Profile: No results for input(s): CHOL, HDL, LDLCALC, TRIG, CHOLHDL, LDLDIRECT in the last 72 hours.  Anemia Panel: No results for input(s): VITAMINB12, FOLATE, FERRITIN, TIBC, IRON, RETICCTPCT in the last 72 hours.  Urine analysis:    Component Value Date/Time   COLORURINE YELLOW (A) 05/07/2021 2044   APPEARANCEUR CLEAR (A) 05/07/2021 2044   LABSPEC 1.021 05/07/2021 2044   PHURINE 6.0 05/07/2021 2044   GLUCOSEU NEGATIVE 05/07/2021 2044   HGBUR NEGATIVE 05/07/2021 2044   BILIRUBINUR NEGATIVE 05/07/2021 2044   [place] 05/07/2021 2044   PROTEINUR NEGATIVE 05/07/2021 2044   NITRITE NEGATIVE 05/07/2021 2044   LEUKOCYTESUR NEGATIVE 05/07/2021 2044   Sepsis Labs: Invalid input(s): PROCALCITONIN, [place]  Microbiology: No results found for this or any previous visit (from the past 240 hour(s)).   Radiology Studies: No results  found.   Lorella Nimrod, MD [facility]  If 7PM-7AM, please contact night-coverage [email or web address] 05/20/2021, 5:47 PM

## 2021-05-23 ENCOUNTER — Encounter: Payer: Self-pay | Admitting: Hematology and Oncology

## 2021-05-23 ENCOUNTER — Encounter: Payer: Self-pay | Admitting: Internal Medicine

## 2021-05-23 DIAGNOSIS — T50905A Adverse effect of unspecified drugs, medicaments and biological substances, initial encounter: Secondary | ICD-10-CM

## 2021-05-23 DIAGNOSIS — C3431 Malignant neoplasm of lower lobe, right bronchus or lung: Secondary | ICD-10-CM | POA: Diagnosis not present

## 2021-05-23 DIAGNOSIS — J189 Pneumonia, unspecified organism: Secondary | ICD-10-CM | POA: Diagnosis not present

## 2021-05-23 DIAGNOSIS — J9601 Acute respiratory failure with hypoxia: Secondary | ICD-10-CM | POA: Diagnosis not present

## 2021-05-23 DIAGNOSIS — R918 Other nonspecific abnormal finding of lung field: Secondary | ICD-10-CM | POA: Diagnosis not present

## 2021-05-23 DIAGNOSIS — J9621 Acute and chronic respiratory failure with hypoxia: Secondary | ICD-10-CM | POA: Diagnosis not present

## 2021-05-23 LAB — IMMUNOGLOBULINS A/E/G/M, SERUM
IgA: 69 mg/dL (ref 61–437)
IgE (Immunoglobulin E), Serum: 23 IU/mL (ref 6–495)
IgG (Immunoglobin G), Serum: 458 mg/dL — ABNORMAL LOW (ref 603–1613)
IgM (Immunoglobulin M), Srm: 19 mg/dL — ABNORMAL LOW (ref 20–172)

## 2021-05-23 NOTE — Progress Notes (Signed)
NAME:  Jacobie Stamey, MRN:  [id number], DOB:  1950-08-23, LOS: 16 ADMISSION DATE:  05/07/2021, INITIAL CONSULTATION DATE: 05/08/2021 REFERRING MD: Dr. Billie Ruddy ; follow-up requested by Dr. Dwyane Dee CHIEF COMPLAINT: Shortness of breath, bilateral infiltrates.  Follow-up requested from initial consultation on 08 May 2021.  Persistent respiratory failure with hypoxia.  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 Patient confirms DNR/DNI code status today and was followed by palliative care team during previous admission. He has one living brother who lives in [place] and has not spoken to him in years. His preferred contact person is [name] (information in patient records). Patient lives alone and does not have assistance at home.    Patient presented to the ER via EMS last night 10/18 with chief complaint of shortness of breath. Patient reports that he has not felt well since his hospital discharge with profound weakness, too weak to eat, and worsening shortness of breath. He reports that he started wearing his home oxygen all of the time due to not feeling well which helped his pulmonary symptoms some. He states he was not using his prescribed inhaler because the pump was malfunctioning on the handheld device. He also describes wheezing and dry, nonproductive cough which have worsened in the last several days. He denies recent fever, chills, sweating, nausea, vomiting, swelling. He reports small volume loose stools occasionally which is not his  routine bowel movements.    On arrival to the ER, patient with noted oxygen saturations in the 80s on room air by EMS with blood pressure systolic in the 88E after a 200cc fluid bolus. Vital signs on arrival pulse 97, BP 103/74, RR 36, oxygen saturations 91% on 6L, afebrile. Patient noted to easily desaturate with activity, speaking, and any interruption to his oxygen flow such as when he was transitioned to 8L high flow nasal cannula with humidifier. Labs noted WBC 16.6, hgb 9.9, Creatinine 1.71, BNP 1105, troponin 3153 and 3506 on repeat. COVID and flu screening negative. Imaging: CXR findings of new left lower lobe airspace process suspicious for pneumonia with extensive right lower lobe density consistent with radiation change. D-dimer was elevated, and CTA of the chest was done which was negative for PE. Patient had sepsis bundle initiated and received 2 liters LR fluid resuscitation, empiric vancomycin and cefepime in the ER. He was started on a heparin continuous infusion for NSTEMI suspected demand ischemia on history of CAD with remote PCI.    PCCM was consulted to assess patient. He is currently being seen in the ER pending bed placement inpatient, tolerating 8 liters highflow oxygen cannula at oxygen saturations 92-93%; however, he has noted increased work of breathing with accessory muscles of breathing use, shortness of breath worsening with conversation with short shallow breaths between words. Patient is alert, oriented to self, location, situation and time. He denies any other recent events other than what is described above. He has never used a BiPAP or other NIPPV support device before but is agreeable to trial on BiPAP for a short duration if his respiratory condition worsens. He confirms DNI status as part of  DNR and that he would not want intubation to support his breathing.   Hospital course from 19 October till now: Patient has presented with acute on chronic respiratory failure with  hypoxia with CT imaging with extensive differential diagnosis.  He has been treated as "worsening pneumonia" with increasing FiO2 requirements in the process.  He is currently on steroids.  Had initial improvement on symptoms however, now has seemed to plateau.  We are asked to render opinion as to how long steroids will be necessary.  He is currently requiring high flow O2 at 60%.  Pertinent  Medical History   Stage IV adenocarcinoma of the lung Myocardial infarction in the past Remote history of PE on Eliquis CAD hx PCI 2005 Chronic anemia secondary to cancer treatment Port-a-cath right chest Recently discharged 04/28/21 after treatment of aspiration pneumonia  Significant Hospital Events: Including procedures, antibiotic start and stop dates in addition to other pertinent events   10/18: Brought by EMS to ER with cc: sob, hypoxic sats 80% on RA and hypotensive. CXR concerning for LUL pna, sepsis with NSTEMI demand ischemia on workup. Started on cefepime, vancomycin, heparin continuous infusion and assigned to hospitalist service pending bed placement.  10/19: Evaluated by PCCM significant pulm history with recurrent pneumonia ? Aspiration versus HCAP, sepsis, NSTEMI, hypotension. Stable sats on 8lpm highflow oxygen, with increased WOB and accessory muscle use.  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  Oxygen requirements have decreased now on bubble high flow.  Continues to clear secretions well.  No  conversational dyspnea.  States he feels "stronger".  Objective   Blood pressure 92/68, pulse 99, temperature 98 F (36.7 C), resp. rate 18, height _0  (1.676 m), weight 64 kg, SpO2 96 %.    SpO2: 96 % O2 Flow Rate (L/min): 15 L/min FiO2 (%): 45 %  Intake/Output Summary (Last 24 hours) at 05/23/2021 1708 Last data filed at 05/23/2021 1505 Gross per 24 hour  Intake 1520 ml  Output 1501 ml  Net 19 ml    Filed Weights   05/07/21 1439 05/10/21 0901 05/10/21 1537  Weight: 58.4 kg 63.2 kg 64 kg    Examination: GENERAL: Chronically ill-appearing gentleman, sitting up in chair, on high flow O2 via nasal cannula, no conversational dyspnea noted today.  Very interactive today.  HEAD: Normocephalic, atraumatic.  EYES: Pupils equal, round, reactive to light.  No scleral icterus.  MOUTH: Oral mucosa moist. NECK: Supple. No thyromegaly. Trachea midline. No JVD.  No adenopathy. PULMONARY: Good air entry bilaterally.  Coarse throughout, no other adventitious sounds. CARDIOVASCULAR: S1 and S2. Regular rate and rhythm.  No rubs, murmurs or gallops heard. ABDOMEN: Scaphoid, otherwise benign. MUSCULOSKELETAL: No joint deformity, no clubbing, no edema.  Diffuse muscle wasting. NEUROLOGIC: No overt focal deficit, gait not tested.   SKIN: Intact,warm,dry. PSYCH: Conversant, jovial.  Mood and behavior normal.  Resolved Hospital Problem list   N/A  Assessment & Plan:  Acute on chronic hypoxic respiratory failure Interstitial and airspace process both lungs Recent hospitalization  for aspiration pneumonia (DC 04/28/21) Stage IV adenocarcinoma of the lung with mets DDX: Pemetrexed/Pembrolizumab toxicity (grade 3), lymphangitic spread of CA (not entirely ruled out) - Titrate oxygen supplementation to maintain O2 sats 88-92% - Has been titrated to bubble high flow - Continue Brovana with Pulmicort twice a day - Continue Yupelri once daily - Continue prednisone at 30 mg daily (started 11/2), taper  as O2 requirements decrease - PJP prophylaxis Bactrim DS Monday Wednesday Friday - Will need taper of steroids over 4 to 6-week period of time - Procalcitonin's have been negative, KL6, pending - Fungitell negative - Immunoglobulins pending - Inflammatory markers are elevated (ESR/CRP), LDH 239 - Repeated CT chest 10/26, persistent interstitial and airspace process with no significant change - Significant improvement noted from yesterday - Received infliximab 5 mg/kg x 1, yesterday, repeat in 2- 3 weeks x1  - Repeat CT chest noncontrasted in 2 to 3 weeks prior to infliximab second dose  Pneumonia ruled out Procalcitonin is normal Persistent findings on CT with differential as above -No need for antibiotics at present except for PJP prophylaxis -Aspiration precautions -Leukocytosis likely due to steroids, WBC 10.5 today   ACUTE KIDNEY INJURY improving - Avoid nephrotoxic agents as able - Follow urine output, BMP - Monitor closely on PJP prophylaxis - Ensure adequate renal perfusion, optimize oxygenation  Anemia, chronic, secondary to cancer treatment - stable - Monitor routine CBC   Elevated d-dimer  History PE on Eliquis - CTA Chest 10/18 negative for PE - High risk for PE recurrence due to underlying malignancy - Continue Eliquis    Radiologic studies  Representative image from CT angio chest obtained 07 May 2021:  Interstitial and airspace process in both lungs  Representative image from CT chest obtained  26 October:   Persistent interstitial process   Labs   CBC: Recent Labs  Lab 05/18/21 0620 05/19/21 0457 05/20/21 0500 05/21/21 0512 05/22/21 0625  WBC 15.2* 12.4* 11.8* 10.9* 10.5  HGB 10.2* 9.6* 9.8* 10.0* 9.6*  HCT 31.5* 30.3* 31.5* 31.7* 30.0*  MCV 95.2 94.7 96.3 96.4 95.2  PLT 184 175 193 191 178     Basic Metabolic Panel: Recent Labs  Lab 05/17/21 0548 05/18/21 0620 05/19/21 0457 05/20/21 0500 05/21/21 0512 05/22/21 0625  NA 135  137 136 139 137 141  K 3.9 4.6 4.6 4.5 4.5 4.4  CL 94* 97* 96* 98 96* 101  CO2 35* 33* 32 35* 34* 34*  GLUCOSE 154* 105* 95 104* 91 88  BUN 46* 40* 41* 33* 33* 33*  CREATININE 1.40* 1.16 1.16 1.09 1.07 0.94  CALCIUM 8.5* 8.8* 8.9 9.0 9.2 9.1  MG 2.0  --   --   --  1.8  --   PHOS 4.1  --   --   --  4.0  --     GFR: Estimated Creatinine Clearance: 66 mL/min (by C-G formula based on SCr of 0.94 mg/dL). Recent Labs  Lab 05/19/21 0457 05/20/21 0500 05/21/21 0512 05/22/21 0625  WBC 12.4* 11.8* 10.9* 10.5     Liver Function Tests: No results for input(s): AST, ALT, ALKPHOS, BILITOT, PROT, ALBUMIN in the last 168 hours.  No results for input(s): LIPASE, AMYLASE in the last 168 hours. No results for input(s): AMMONIA in the last 168 hours.  ABG    Component Value Date/Time   PHART 7.51 (H) 05/14/2021 1459   PCO2ART 38 05/14/2021 1459   PO2ART 51 (L) 05/14/2021 1459   HCO3 30.3 (H) 05/14/2021 1459  O2SAT 89.4 05/14/2021 1459     CBG: Recent Labs  Lab 05/21/21 1606  GLUCAP 159*     Review of Systems:   A 10 point review of systems was performed and it is as noted above otherwise negative.  Allergies No Known Allergies   Scheduled Meds:  apixaban  5 mg Oral BID   arformoterol  15 mcg Nebulization BID   atorvastatin  40 mg Oral Daily   budesonide (PULMICORT) nebulizer solution  0.5 mg Nebulization BID   Chlorhexidine Gluconate Cloth  6 each Topical Daily   feeding supplement  237 mL Oral TID BM   FLUoxetine  40 mg Oral Daily   guaiFENesin  1,200 mg Oral BID   OLANZapine  10 mg Oral QHS   predniSONE  30 mg Oral Q breakfast   revefenacin  175 mcg Nebulization Daily   sulfamethoxazole-trimethoprim  1 tablet Oral Once per day on Mon Wed Fri   Continuous Infusions:  sodium chloride 10 mL/hr (05/13/21 1620)   PRN Meds:.sodium chloride, acetaminophen, ALPRAZolam, alum & mag hydroxide-simeth, docusate sodium, ondansetron (ZOFRAN) IV, ondansetron, oxyCODONE,  polyethylene glycol, sodium chloride Received infliximab x1 today  Level 2 follow-up    Discussion: Patient appears to have more of an interstitial process that is failing to improve this is either 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  At this point ICI induced pneumonitis more likely.  KL- 6 has been shown useful in determining ILD from other modalities, however this may take a few weeks to come back. Continue prednisone at decreased dose of 30 mg daily, started yesterday.  He will need a 4 to 6-week slow taper.  Continue to taper off as O2 requirements decrease.  Continue PJP prophylaxis (MWF).  Challenged with infliximab 5 mg/kg x 1 yesterday.  Showing improvement today.  Continue nebulization treatments and pulmonary hygiene.  Will continue to follow along with you.  Renold Don, MD Advanced Bronchoscopy PCCM [facility]    *This note was dictated using voice recognition software/Dragon.  Despite best efforts to proofread, errors can occur which can change the meaning.  Any change was purely unintentional.

## 2021-05-23 NOTE — Progress Notes (Signed)
PROGRESS NOTE  Jimmy West RCV:[id number] DOB: 08-09-1950   PCP: Pcp, No  Patient is from: Home.  Uses rolling walker at baseline.  DOA: 05/07/2021 LOS: 78  Chief complaints:  Chief Complaint  Patient presents with   Shortness of Breath     Brief Narrative / Interim history: 70 year old M with PMH of stage IV RLL lung cancer, cancer pain, CAD/stent in 2005, chronic respiratory failure with hypoxia on 2 L, anemia of chronic disease, anxiety, depression and debility presenting with generalized weakness, shortness of breath and dry cough, and admitted for acute on chronic hypoxic respiratory failure in the setting of LLL pneumonia and non-STEMI.  Started on broad-spectrum antibiotics, steroid and IV heparin.  Cardiology consulted recommended medical management and [place] outpatient.  CTA chest negative for PE.  10/25 patient became hypoxic and required directed oxygen, started high flow nasal cannula, FiO2 54%, inpatient setting around 90s  11/1: Patient continued to require heated high flow at 40L and 45% FiO2, clinically seems stable.  Pulmonology is following.  No obvious infection.  Completed the course of antibiotics.  Currently procalcitonin negative.  Per pulmonary note differential can be Pemetrexed/Pembrolizumab toxicity (grade 3), lymphangitic spread of CA (not entirely ruled out).  Will require long taper of steroid. They are also recommending trial of infliximab-patient received first dose on 05/22/2021  11/3: Patient with significant improvement in oxygen requirement after getting infliximab yesterday, now saturating in mid 90s on 15L of oxygen with high flow.  Subjective: Patient was feeling much improved when seen today.  Saturating well on 15 L of oxygen with high flow, which is a big improvement in 1 day.  Objective: Vitals:   05/20/21 1200 05/20/21 1317 05/20/21 1333 05/20/21 1551  BP: 116/65   91/71  Pulse: 98   87  Resp: 18   17  Temp: 98.1 F (36.7 C)    98.3 F (36.8 C)  TempSrc:      SpO2: 92% 90% 91% 96%  Weight:      Height:        Intake/Output Summary (Last 24 hours) at 05/20/2021 1747 Last data filed at 05/20/2021 1047 Gross per 24 hour  Intake 480 ml  Output 2700 ml  Net -2220 ml   Filed Weights   05/07/21 1439 05/10/21 0901 05/10/21 1537  Weight: 58.4 kg 63.2 kg 64 kg    Examination:  General.  Frail elderly man, in no acute distress. Pulmonary.  Lungs clear bilaterally, normal respiratory effort. CV.  Regular rate and rhythm, no JVD, rub or murmur. Abdomen.  Soft, nontender, nondistended, BS positive. CNS.  Alert and oriented .  No focal neurologic deficit. Extremities.  No edema, no cyanosis, pulses intact and symmetrical. Psychiatry.  Judgment and insight appears normal.   Procedures:  None  Microbiology summarized: COVID-19 and influenza PCR nonreactive. MRSA PCR screen negative. Blood cultures NGTD.  Assessment & Plan: Acute on chronic respiratory failure with hypoxia due to LLL pneumonia with superimposed stage IV RLL lung cancer-on 2 L at baseline.  Oxygen requirement improved to 15 L with high flow after getting infliximab yesterday. .  CTA chest negative for PE but concern for LLL pneumonia with RLL scarring from known lung cancer.   Blood cultures negative. On cefepime since 10/18 making today day 7. S/p vancomycin. Oxygen requirement seems stable at this time - s/p Cefepime completed 7 days. S/p azithromycin 500 x 3 doses  - check CXR to eval for parapneumonic effusion which would require extended course abx -  stop solumedrol, start oral prednisone, plan to wean -Wean oxygen as able. -IS/OOB/PT/OT - palliative consulted - Dr. B of oncology aware of the patient -Pulmonology was also consulted and there is a concern of lymphangitis spread versus drug toxicity. -Pulmonary is recommending prolonged taper of steroid. -Received first dose of infliximab and repeat in 2 weeks as he did showed  improvement.  Severe sepsis due to pneumonia:tachycardia, tachypnea, leukocytosis, AKI with pneumonia POA. Sepsis physiology resolved -Management as above.   NSTEMI/history of CAD/stent in 2005: Troponin 3153 -> 3506 -> 2927 -> 2894.  EKG with T wave changes in lateral leads but no dynamic changes.  TTE with LVEF of 55 to 60%, G1-DD and no RWMA chest pain-free.  -Completed 48 hours of IV heparin and transition back to home Eliquis. -Medical management due to poor candidacy for heart cath in the setting of respiratory failure -Cardiology recommends outpatient follow-up when stable from respiratory standpoint.  Chronic diastolic CHF: TTE as above.  Appears euvolemic on exam.  Does not seem to be on diuretics at home.  About 2100 ml UOP/24 hours. -Monitor fluid status   AKI/azotemia on CKD-3A: Improved.  Does not seem to be on nephrotoxic meds.  Could be hemodynamically mediated Recent Labs    05/11/21 0555 05/12/21 0530 05/13/21 0515 05/14/21 0706 05/15/21 0445 05/16/21 0603 05/17/21 0548 05/18/21 0620 05/19/21 0457 05/20/21 0500  BUN 42* 48* 41* 33* 33* 41* 46* 40* 41* 33*  CREATININE 1.39* 1.32* 1.22 1.13 1.15 1.41* 1.40* 1.16 1.16 1.09  -Continue monitoring -Avoid nephrotoxic meds.  Hypotension: Resolved.   Stage IV lung cancer s/p radiation and chemotherapy previously.   -Outpatient follow-up - oncology following    Anemia of chronic disease: H&H stable after 1 unit Recent Labs    05/09/21 0701 05/10/21 0517 05/11/21 0555 05/12/21 0530 05/15/21 0445 05/16/21 0603 05/17/21 0548 05/18/21 0620 05/19/21 0457 05/20/21 0500  HGB 8.1* 7.6* 9.3* 9.8* 10.3* 11.0* 10.2* 10.2* 9.6* 9.8*  -Continue monitoring   Chronic pain syndrome: -Continue oxycodone and bowel regimen as needed.   Depression and Anxiety: Stable -Continue with Fluoxetine 40 g p.o. daily as well as Olanzapine 10 mg p.o. nightly   Hx of PE: CTA chest negative for PE. -Continue home  Eliquis  Physical deconditioning-uses rolling walker at baseline. -Therapy recommends SNF  Leukocytosis/bandemia-likely from steroid.  Goal of care counseling-patient with comorbidity as above.  Very frail and deconditioned.  Poor long-term prognosis.  Appropriately DNR/DNI.  A candidate for hospice -Palliative medicine consulted.  Body mass index is 22.77 kg/m.    DVT prophylaxis:  apixaban (ELIQUIS) tablet 5 mg  Code Status: DNR/DNI Family Communication:    Level of care: Progressive Cardiac Status is: Inpatient  Remains inpatient appropriate because: Due to significant oxygen requirement, work of breathing and need for IV medications and close monitoring Patient is currently on high flow nasal cannula 40 L, FiO2 45%    Consultants:  Cardiology Pulmonology Palliative medicine   Sch Meds:  Scheduled Meds:  apixaban  5 mg Oral BID   atorvastatin  40 mg Oral Daily   budesonide (PULMICORT) nebulizer solution  0.5 mg Nebulization BID   Chlorhexidine Gluconate Cloth  6 each Topical Daily   feeding supplement  237 mL Oral TID BM   FLUoxetine  40 mg Oral Daily   fluticasone furoate-vilanterol  1 puff Inhalation Daily   guaiFENesin  1,200 mg Oral BID   ipratropium-albuterol  3 mL Nebulization Q6H   OLANZapine  10 mg Oral QHS  predniSONE  40 mg Oral Q breakfast   sulfamethoxazole-trimethoprim  1 tablet Oral Once per day on Mon Wed Fri   Continuous Infusions:  sodium chloride 10 mL/hr (05/13/21 1620)   PRN Meds:.sodium chloride, acetaminophen, ALPRAZolam, alum & mag hydroxide-simeth, docusate sodium, guaiFENesin-dextromethorphan, ondansetron (ZOFRAN) IV, ondansetron, oxyCODONE, polyethylene glycol, sodium chloride  Antimicrobials: Anti-infectives (From admission, onward)    Start     Dose/Rate Route Frequency Ordered Stop   05/17/21 0900  sulfamethoxazole-trimethoprim (BACTRIM DS) 800-160 MG per tablet 1 tablet        1 tablet Oral Once per day on Mon Wed Fri 05/16/21  0935     05/13/21 1030  ceFEPIme (MAXIPIME) 2 g in sodium chloride 0.9 % 100 mL IVPB        2 g 200 mL/hr over 30 Minutes Intravenous Every 12 hours 05/13/21 0952 05/13/21 1959   05/11/21 1630  azithromycin (ZITHROMAX) 500 mg in sodium chloride 0.9 % 250 mL IVPB        500 mg 250 mL/hr over 60 Minutes Intravenous Every 24 hours 05/11/21 1537 05/13/21 1721   05/08/21 1800  vancomycin (VANCOREADY) IVPB 750 mg/150 mL  Status:  Discontinued        750 mg 150 mL/hr over 60 Minutes Intravenous Every 24 hours 05/08/21 1135 05/09/21 1448   05/08/21 0800  ceFEPIme (MAXIPIME) 2 g in sodium chloride 0.9 % 100 mL IVPB  Status:  Discontinued        2 g 200 mL/hr over 30 Minutes Intravenous Every 12 hours 05/07/21 1929 05/13/21 0952   05/07/21 2200  ceFEPIme (MAXIPIME) 2 g in sodium chloride 0.9 % 100 mL IVPB  Status:  Discontinued        2 g 200 mL/hr over 30 Minutes Intravenous Every 12 hours 05/07/21 1927 05/07/21 1929   05/07/21 1930  vancomycin (VANCOREADY) IVPB 500 mg/100 mL        500 mg 100 mL/hr over 60 Minutes Intravenous  Once 05/07/21 1927 05/07/21 2144   05/07/21 1927  vancomycin variable dose per unstable renal function (pharmacist dosing)  Status:  Discontinued         Does not apply See admin instructions 05/07/21 1927 05/09/21 1448   05/07/21 1600  vancomycin (VANCOCIN) IVPB 1000 mg/200 mL premix        1,000 mg 200 mL/hr over 60 Minutes Intravenous  Once 05/07/21 1552 05/07/21 1801   05/07/21 1600  ceFEPIme (MAXIPIME) 2 g in sodium chloride 0.9 % 100 mL IVPB        2 g 200 mL/hr over 30 Minutes Intravenous  Once 05/07/21 1552 05/07/21 1700        I have personally reviewed the following labs and images: CBC: Recent Labs  Lab 05/16/21 0603 05/17/21 0548 05/18/21 0620 05/19/21 0457 05/20/21 0500  WBC 17.5* 14.9* 15.2* 12.4* 11.8*  HGB 11.0* 10.2* 10.2* 9.6* 9.8*  HCT 33.6* 31.4* 31.5* 30.3* 31.5*  MCV 96.3 94.0 95.2 94.7 96.3  PLT 196 186 184 175 193   BMP  &GFR Recent Labs  Lab 05/14/21 0706 05/15/21 0445 05/16/21 0603 05/17/21 0548 05/18/21 0620 05/19/21 0457 05/20/21 0500  NA 143 140 139 135 137 136 139  K 4.0 4.1 3.8 3.9 4.6 4.6 4.5  CL 104 102 99 94* 97* 96* 98  CO2 32 34* 34* 35* 33* 32 35*  GLUCOSE 96 109* 129* 154* 105* 95 104*  BUN 33* 33* 41* 46* 40* 41* 33*  CREATININE 1.13 1.15 1.41* 1.40* 1.16  1.16 1.09  CALCIUM 9.0 8.5* 8.7* 8.5* 8.8* 8.9 9.0  MG 2.1 2.1 1.9 2.0  --   --   --   PHOS  --  3.6 4.5 4.1  --   --   --    Estimated Creatinine Clearance: 56.9 mL/min (by C-G formula based on SCr of 1.09 mg/dL). Liver & Pancreas: No results for input(s): AST, ALT, ALKPHOS, BILITOT, PROT, ALBUMIN in the last 168 hours.  No results for input(s): LIPASE, AMYLASE in the last 168 hours. No results for input(s): AMMONIA in the last 168 hours. Diabetic: No results for input(s): HGBA1C in the last 72 hours. No results for input(s): GLUCAP in the last 168 hours.  Cardiac Enzymes: No results for input(s): CKTOTAL, CKMB, CKMBINDEX, TROPONINI in the last 168 hours. No results for input(s): PROBNP in the last 8760 hours. Coagulation Profile: No results for input(s): INR, PROTIME in the last 168 hours.  Thyroid Function Tests: No results for input(s): TSH, T4TOTAL, FREET4, T3FREE, THYROIDAB in the last 72 hours. Lipid Profile: No results for input(s): CHOL, HDL, LDLCALC, TRIG, CHOLHDL, LDLDIRECT in the last 72 hours.  Anemia Panel: No results for input(s): VITAMINB12, FOLATE, FERRITIN, TIBC, IRON, RETICCTPCT in the last 72 hours.  Urine analysis:    Component Value Date/Time   COLORURINE YELLOW (A) 05/07/2021 2044   APPEARANCEUR CLEAR (A) 05/07/2021 2044   LABSPEC 1.021 05/07/2021 2044   PHURINE 6.0 05/07/2021 2044   GLUCOSEU NEGATIVE 05/07/2021 2044   HGBUR NEGATIVE 05/07/2021 2044   BILIRUBINUR NEGATIVE 05/07/2021 2044   [place] 05/07/2021 2044   PROTEINUR NEGATIVE 05/07/2021 2044   NITRITE NEGATIVE  05/07/2021 2044   LEUKOCYTESUR NEGATIVE 05/07/2021 2044   Sepsis Labs: Invalid input(s): PROCALCITONIN, [place]  Microbiology: No results found for this or any previous visit (from the past 240 hour(s)).   Radiology Studies: No results found.   Lorella Nimrod, MD [facility]  If 7PM-7AM, please contact night-coverage [email or web address] 05/20/2021, 5:47 PM

## 2021-05-23 NOTE — Plan of Care (Signed)
Problem: Education: Goal: Knowledge of General Education information will improve Description: Including pain rating scale, medication(s)/side effects and non-pharmacologic comfort measures 05/23/2021 1313 by Cristela Blue, RN Outcome: Progressing 05/23/2021 1313 by Cristela Blue, RN Outcome: Progressing   Problem: Health Behavior/Discharge Planning: Goal: Ability to manage health-related needs will improve 05/23/2021 1313 by Cristela Blue, RN Outcome: Progressing 05/23/2021 1313 by Cristela Blue, RN Outcome: Progressing   Problem: Clinical Measurements: Goal: Ability to maintain clinical measurements within normal limits will improve 05/23/2021 1313 by Cristela Blue, RN Outcome: Progressing 05/23/2021 1313 by Cristela Blue, RN Outcome: Progressing Goal: Will remain free from infection 05/23/2021 1313 by Cristela Blue, RN Outcome: Progressing 05/23/2021 1313 by Cristela Blue, RN Outcome: Progressing Goal: Diagnostic test results will improve 05/23/2021 1313 by Cristela Blue, RN Outcome: Progressing 05/23/2021 1313 by Cristela Blue, RN Outcome: Progressing Goal: Respiratory complications will improve 05/23/2021 1313 by Cristela Blue, RN Outcome: Progressing 05/23/2021 1313 by Cristela Blue, RN Outcome: Progressing Goal: Cardiovascular complication will be avoided 05/23/2021 1313 by Cristela Blue, RN Outcome: Progressing 05/23/2021 1313 by Cristela Blue, RN Outcome: Progressing   Problem: Activity: Goal: Risk for activity intolerance will decrease 05/23/2021 1313 by Cristela Blue, RN Outcome: Progressing 05/23/2021 1313 by Cristela Blue, RN Outcome: Progressing   Problem: Nutrition: Goal: Adequate nutrition will be maintained 05/23/2021 1313 by Cristela Blue, RN Outcome: Progressing 05/23/2021 1313 by Cristela Blue, RN Outcome: Progressing   Problem: Coping: Goal: Level of anxiety will decrease 05/23/2021 1313 by Cristela Blue, RN Outcome:  Progressing 05/23/2021 1313 by Cristela Blue, RN Outcome: Progressing   Problem: Elimination: Goal: Will not experience complications related to bowel motility 05/23/2021 1313 by Cristela Blue, RN Outcome: Progressing 05/23/2021 1313 by Cristela Blue, RN Outcome: Progressing Goal: Will not experience complications related to urinary retention 05/23/2021 1313 by Cristela Blue, RN Outcome: Progressing 05/23/2021 1313 by Cristela Blue, RN Outcome: Progressing   Problem: Pain Managment: Goal: General experience of comfort will improve 05/23/2021 1313 by Cristela Blue, RN Outcome: Progressing 05/23/2021 1313 by Cristela Blue, RN Outcome: Progressing   Problem: Safety: Goal: Ability to remain free from injury will improve 05/23/2021 1313 by Cristela Blue, RN Outcome: Progressing 05/23/2021 1313 by Cristela Blue, RN Outcome: Progressing   Problem: Skin Integrity: Goal: Risk for impaired skin integrity will decrease 05/23/2021 1313 by Cristela Blue, RN Outcome: Progressing 05/23/2021 1313 by Cristela Blue, RN Outcome: Progressing   Problem: Activity: Goal: Ability to tolerate increased activity will improve 05/23/2021 1313 by Cristela Blue, RN Outcome: Progressing 05/23/2021 1313 by Cristela Blue, RN Outcome: Progressing   Problem: Clinical Measurements: Goal: Ability to maintain a body temperature in the normal range will improve 05/23/2021 1313 by Cristela Blue, RN Outcome: Progressing 05/23/2021 1313 by Cristela Blue, RN Outcome: Progressing   Problem: Respiratory: Goal: Ability to maintain adequate ventilation will improve 05/23/2021 1313 by Cristela Blue, RN Outcome: Progressing 05/23/2021 1313 by Cristela Blue, RN Outcome: Progressing Goal: Ability to maintain a clear airway will improve 05/23/2021 1313 by Cristela Blue, RN Outcome: Progressing 05/23/2021 1313 by Cristela Blue, RN Outcome: Progressing   Problem: Education: Goal: Understanding of cardiac  disease, CV risk reduction, and recovery process will improve 05/23/2021 1313 by Cristela Blue, RN Outcome: Progressing 05/23/2021 1313 by Cristela Blue, RN Outcome: Progressing Goal: Individualized Educational Video(s) 05/23/2021 1313 by Cristela Blue, RN Outcome: Progressing 05/23/2021 1313 by Cristela Blue, RN Outcome: Progressing   Problem: Activity: Goal: Ability to tolerate increased activity will improve 05/23/2021 1313 by Cristela Blue, RN Outcome: Progressing 05/23/2021 1313 by Cristela Blue,  RN Outcome: Progressing   Problem: Cardiac: Goal: Ability to achieve and maintain adequate cardiovascular perfusion will improve 05/23/2021 1313 by Cristela Blue, RN Outcome: Progressing 05/23/2021 1313 by Cristela Blue, RN Outcome: Progressing   Problem: Health Behavior/Discharge Planning: Goal: Ability to safely manage health-related needs after discharge will improve 05/23/2021 1313 by Cristela Blue, RN Outcome: Progressing 05/23/2021 1313 by Cristela Blue, RN Outcome: Progressing

## 2021-05-23 NOTE — Progress Notes (Signed)
Occupational Therapy RE- Evaluation Patient Details Name: Jimmy West MRN: [id number] DOB: 1951-06-06 Today's Date: 05/23/2021   History of Present Illness 70 y.o. male was brought to hosp to admit 10/18 for hypoxia, weakness, SOB and noted sepsis from PNA, as well as NSTEMI.  Troponins are high from demand ischemia, EF 40-45%, and has recent asp PNA per chart.  PMHx:  R lower lobe stage IV adenocarcinoma of the lung, chronic anemia, anxiety, depression, presence of Port-A-Cath, cad s/p PCI with stent placement in 2005, tobacco abuse quit in 2005   Clinical Impression   Mr. Hodgman seen for OT re-evaluation on this date due to prolonged hospital stay. Upon arrival to room pt awake/alert. Pt seated upright in bed. Pt agreeable to tx. Pt instructed in ECS. Pt CGA for functinal mobility, handheld assist bed to chair. During t/f, 2 minor LOB noted, MIN A to correct. Pt tolerated static sitting EOB ~ 5 min for education - SUPERVISION. Pt remained in 90s throughout tx. Pt making good progress toward goals. Pt continues to benefit from skilled OT services to maximize return to PLOF and minimize risk of future falls, injury, caregiver burden, and readmission. Will continue to follow POC. Discharge recommendation remains appropriate.       Recommendations for follow up therapy are one component of a multi-disciplinary discharge planning process, led by the attending physician.  Recommendations may be updated based on patient status, additional functional criteria and insurance authorization.   Follow Up Recommendations  Skilled nursing-short term rehab (<3 hours/day)    Assistance Recommended at Discharge Intermittent Supervision/Assistance  Functional Status Assessment     Equipment Recommendations  Other (comment) (defer to next venue of care)    Recommendations for Other Services       Precautions / Restrictions Precautions Precautions: Fall Restrictions Weight Bearing Restrictions:  No      Mobility Bed Mobility Overal bed mobility: Needs Assistance Bed Mobility: Supine to Sit     Supine to sit: Min guard          Transfers Overall transfer level: Needs assistance   Transfers: Sit to/from Stand Sit to Stand: Min guard           General transfer comment: 2 minor LOB noted, MIN A to correct      Balance Overall balance assessment: Needs assistance Sitting-balance support: Feet supported;No upper extremity supported Sitting balance-Leahy Scale: Good     Standing balance support: No upper extremity supported;During functional activity Standing balance-Leahy Scale: Good                             ADL either performed or assessed with clinical judgement   ADL Overall ADL's : Needs assistance/impaired                                       General ADL Comments: CGA for functinal mobility. SUP for EOB tasks 2/2 lowering SPO2 flow.      Pertinent Vitals/Pain Pain Assessment: No/denies pain     Cognition Arousal/Alertness: Awake/alert Behavior During Therapy: WFL for tasks assessed/performed Overall Cognitive Status: Within Functional Limits for tasks assessed                                       General Comments  Pt remained in 90s throughout tx on 15L hNC    Exercises Exercises: Other exercises Other Exercises Other Exercises: Pt educ re: OT role, ECS, d/c recs, importance of movement for functional mobility, monitoring O2 Other Exercises: Sup>sit, sit<>stand bed to chair, spirometer use, acapella use,    OT Goals(Current goals can be found in the care plan section) Acute Rehab OT Goals OT Goal Formulation: With patient Time For Goal Achievement: 06/06/21 Potential to Achieve Goals: Good ADL Goals Pt Will Perform Grooming: with modified independence;standing Pt Will Perform Lower Body Dressing: with modified independence;sit to/from stand Pt Will Transfer to Toilet: regular height  toilet;with modified independence;ambulating  OT Frequency: Min 2X/week   Barriers to D/C:            Co-evaluation              AM-PAC OT "6 Clicks" Daily Activity     Outcome Measure Help from another person eating meals?: None Help from another person taking care of personal grooming?: A Little Help from another person toileting, which includes using toliet, bedpan, or urinal?: A Little Help from another person bathing (including washing, rinsing, drying)?: A Lot Help from another person to put on and taking off regular upper body clothing?: A Little Help from another person to put on and taking off regular lower body clothing?: A Lot 6 Click Score: 17   End of Session Equipment Utilized During Treatment: Oxygen Nurse Communication: Other (comment) (Pt requested meds/ O2 change)  Activity Tolerance: Patient tolerated treatment well Patient left: in chair;with call bell/phone within reach;with chair alarm set  OT Visit Diagnosis: Other abnormalities of gait and mobility (R26.89);Muscle weakness (generalized) (M62.81)                Time: 1829-9371 OT Time Calculation (min): 16 min Charges:  OT General Charges $OT Visit: 1 Visit OT Treatments $Self Care/Home Management : 8-22 mins  Nino Glow, Markus Daft 05/23/2021, 3:58 PM

## 2021-05-24 ENCOUNTER — Inpatient Hospital Stay: Payer: Medicare HMO

## 2021-05-24 DIAGNOSIS — J189 Pneumonia, unspecified organism: Secondary | ICD-10-CM | POA: Diagnosis not present

## 2021-05-24 DIAGNOSIS — C3431 Malignant neoplasm of lower lobe, right bronchus or lung: Secondary | ICD-10-CM | POA: Diagnosis not present

## 2021-05-24 DIAGNOSIS — J9601 Acute respiratory failure with hypoxia: Secondary | ICD-10-CM | POA: Diagnosis not present

## 2021-05-24 DIAGNOSIS — T50905A Adverse effect of unspecified drugs, medicaments and biological substances, initial encounter: Secondary | ICD-10-CM | POA: Diagnosis not present

## 2021-05-24 NOTE — Progress Notes (Signed)
PROGRESS NOTE  Jimmy West TSV:[id number] DOB: 27-Nov-1950   PCP: Pcp, No  Patient is from: Home.  Uses rolling walker at baseline.  DOA: 05/07/2021 LOS: 28  Chief complaints:  Chief Complaint  Patient presents with   Shortness of Breath     Brief Narrative / Interim history: 70 year old M with PMH of stage IV RLL lung cancer, cancer pain, CAD/stent in 2005, chronic respiratory failure with hypoxia on 2 L, anemia of chronic disease, anxiety, depression and debility presenting with generalized weakness, shortness of breath and dry cough, and admitted for acute on chronic hypoxic respiratory failure in the setting of LLL pneumonia and non-STEMI.  Started on broad-spectrum antibiotics, steroid and IV heparin.  Cardiology consulted recommended medical management and [place] outpatient.  CTA chest negative for PE.  10/25 patient became hypoxic and required directed oxygen, started high flow nasal cannula, FiO2 54%, inpatient setting around 90s  11/1: Patient continued to require heated high flow at 40L and 45% FiO2, clinically seems stable.  Pulmonology is following.  No obvious infection.  Completed the course of antibiotics.  Currently procalcitonin negative.  Per pulmonary note differential can be Pemetrexed/Pembrolizumab toxicity (grade 3), lymphangitic spread of CA (not entirely ruled out).  Will require long taper of steroid. They are also recommending trial of infliximab-patient received first dose on 05/22/2021  11/3: Patient with significant improvement in oxygen requirement after getting infliximab yesterday, now saturating in mid 90s on 15L of oxygen with high flow.  11/4: Oxygenation continues to improve, still not at baseline.  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  Oncology saw him today and discontinued the immune therapy and they will just do surveillance at this time. Patient will need second dose of infliximab in 2 to 3 weeks after having a  repeat CT chest.  Subjective: Patient was feeling much improved, saturating well on 12 L with high flow.  Still not at his baseline.  Objective: Vitals:   05/20/21 1200 05/20/21 1317 05/20/21 1333 05/20/21 1551  BP: 116/65   91/71  Pulse: 98   87  Resp: 18   17  Temp: 98.1 F (36.7 C)   98.3 F (36.8 C)  TempSrc:      SpO2: 92% 90% 91% 96%  Weight:      Height:        Intake/Output Summary (Last 24 hours) at 05/20/2021 1747 Last data filed at 05/20/2021 1047 Gross per 24 hour  Intake 480 ml  Output 2700 ml  Net -2220 ml   Filed Weights   05/07/21 1439 05/10/21 0901 05/10/21 1537  Weight: 58.4 kg 63.2 kg 64 kg    Examination:  General.  Malnourished elderly man, in no acute distress. Pulmonary.  Few coarse breath sounds bilaterally, normal respiratory effort. CV.  Regular rate and rhythm, no JVD, rub or murmur. Abdomen.  Soft, nontender, nondistended, BS positive. CNS.  Alert and oriented .  No focal neurologic deficit. Extremities.  No edema, no cyanosis, pulses intact and symmetrical. Psychiatry.  Judgment and insight appears normal.  Procedures:  None  Microbiology summarized: COVID-19 and influenza PCR nonreactive. MRSA PCR screen negative. Blood cultures NGTD.  Assessment & Plan: Acute on chronic respiratory failure with hypoxia due to LLL pneumonia with superimposed stage IV RLL lung cancer-on 2 L at baseline.  Oxygen requirement improved to 15 L with high flow after getting infliximab yesterday. .  CTA chest negative for PE but concern for LLL pneumonia with RLL scarring from known lung cancer.  Blood cultures negative. On cefepime since 10/18 making today day 7. S/p vancomycin. Oxygen requirement seems stable at this time - s/p Cefepime completed 7 days. S/p azithromycin 500 x 3 doses  - check CXR to eval for parapneumonic effusion which would require extended course abx - stop solumedrol, start oral prednisone, plan to wean -Wean oxygen as  able. -IS/OOB/PT/OT - palliative consulted - Dr. B of oncology aware of the patient -Pulmonology was also consulted and there is a concern of lymphangitis spread versus drug toxicity. -Pulmonary is recommending prolonged taper of steroid. -Received first dose of infliximab and repeat in 2 weeks as he did showed improvement. -Per pulmonology they will repeat infliximab in 2 to 3 weeks and he will need a repeat CT chest before getting the second dose. -Oncology stopped immune therapy due to development of pneumonitis, we will just do surveillance at this time.  Severe sepsis due to pneumonia:tachycardia, tachypnea, leukocytosis, AKI with pneumonia POA. Sepsis physiology resolved -Management as above.   NSTEMI/history of CAD/stent in 2005: Troponin 3153 -> 3506 -> 2927 -> 2894.  EKG with T wave changes in lateral leads but no dynamic changes.  TTE with LVEF of 55 to 60%, G1-DD and no RWMA chest pain-free.  -Completed 48 hours of IV heparin and transition back to home Eliquis. -Medical management due to poor candidacy for heart cath in the setting of respiratory failure -Cardiology recommends outpatient follow-up when stable from respiratory standpoint.  Chronic diastolic CHF: TTE as above.  Appears euvolemic on exam.  Does not seem to be on diuretics at home.  About 2100 ml UOP/24 hours. -Monitor fluid status   AKI/azotemia on CKD-3A: Improved.  Does not seem to be on nephrotoxic meds.  Could be hemodynamically mediated Recent Labs    05/11/21 0555 05/12/21 0530 05/13/21 0515 05/14/21 0706 05/15/21 0445 05/16/21 0603 05/17/21 0548 05/18/21 0620 05/19/21 0457 05/20/21 0500  BUN 42* 48* 41* 33* 33* 41* 46* 40* 41* 33*  CREATININE 1.39* 1.32* 1.22 1.13 1.15 1.41* 1.40* 1.16 1.16 1.09  -Continue monitoring -Avoid nephrotoxic meds.  Hypotension: Resolved.   Stage IV lung cancer s/p radiation and chemotherapy previously.   -Outpatient follow-up - oncology following    Anemia of  chronic disease: H&H stable after 1 unit Recent Labs    05/09/21 0701 05/10/21 0517 05/11/21 0555 05/12/21 0530 05/15/21 0445 05/16/21 0603 05/17/21 0548 05/18/21 0620 05/19/21 0457 05/20/21 0500  HGB 8.1* 7.6* 9.3* 9.8* 10.3* 11.0* 10.2* 10.2* 9.6* 9.8*  -Continue monitoring   Chronic pain syndrome: -Continue oxycodone and bowel regimen as needed.   Depression and Anxiety: Stable -Continue with Fluoxetine 40 g p.o. daily as well as Olanzapine 10 mg p.o. nightly   Hx of PE: CTA chest negative for PE. -Continue home Eliquis  Physical deconditioning-uses rolling walker at baseline. -Therapy recommends SNF  Leukocytosis/bandemia-likely from steroid.  Goal of care counseling-patient with comorbidity as above.  Very frail and deconditioned.  Poor long-term prognosis.  Appropriately DNR/DNI.  A candidate for hospice -Palliative medicine consulted.  Body mass index is 22.77 kg/m.    DVT prophylaxis:  apixaban (ELIQUIS) tablet 5 mg  Code Status: DNR/DNI Family Communication:    Level of care: Progressive Cardiac Status is: Inpatient  Remains inpatient appropriate because: Due to significant oxygen requirement, work of breathing and need for IV medications and close monitoring Patient is currently on high flow nasal cannula at 12 L, improving but still not at baseline.   Consultants:  Cardiology Pulmonology Palliative medicine Oncology  Sch Meds:  Scheduled Meds:  apixaban  5 mg Oral BID   atorvastatin  40 mg Oral Daily   budesonide (PULMICORT) nebulizer solution  0.5 mg Nebulization BID   Chlorhexidine Gluconate Cloth  6 each Topical Daily   feeding supplement  237 mL Oral TID BM   FLUoxetine  40 mg Oral Daily   fluticasone furoate-vilanterol  1 puff Inhalation Daily   guaiFENesin  1,200 mg Oral BID   ipratropium-albuterol  3 mL Nebulization Q6H   OLANZapine  10 mg Oral QHS   predniSONE  40 mg Oral Q breakfast   sulfamethoxazole-trimethoprim  1 tablet Oral  Once per day on Mon Wed Fri   Continuous Infusions:  sodium chloride 10 mL/hr (05/13/21 1620)   PRN Meds:.sodium chloride, acetaminophen, ALPRAZolam, alum & mag hydroxide-simeth, docusate sodium, guaiFENesin-dextromethorphan, ondansetron (ZOFRAN) IV, ondansetron, oxyCODONE, polyethylene glycol, sodium chloride  Antimicrobials: Anti-infectives (From admission, onward)    Start     Dose/Rate Route Frequency Ordered Stop   05/17/21 0900  sulfamethoxazole-trimethoprim (BACTRIM DS) 800-160 MG per tablet 1 tablet        1 tablet Oral Once per day on Mon Wed Fri 05/16/21 0935     05/13/21 1030  ceFEPIme (MAXIPIME) 2 g in sodium chloride 0.9 % 100 mL IVPB        2 g 200 mL/hr over 30 Minutes Intravenous Every 12 hours 05/13/21 0952 05/13/21 1959   05/11/21 1630  azithromycin (ZITHROMAX) 500 mg in sodium chloride 0.9 % 250 mL IVPB        500 mg 250 mL/hr over 60 Minutes Intravenous Every 24 hours 05/11/21 1537 05/13/21 1721   05/08/21 1800  vancomycin (VANCOREADY) IVPB 750 mg/150 mL  Status:  Discontinued        750 mg 150 mL/hr over 60 Minutes Intravenous Every 24 hours 05/08/21 1135 05/09/21 1448   05/08/21 0800  ceFEPIme (MAXIPIME) 2 g in sodium chloride 0.9 % 100 mL IVPB  Status:  Discontinued        2 g 200 mL/hr over 30 Minutes Intravenous Every 12 hours 05/07/21 1929 05/13/21 0952   05/07/21 2200  ceFEPIme (MAXIPIME) 2 g in sodium chloride 0.9 % 100 mL IVPB  Status:  Discontinued        2 g 200 mL/hr over 30 Minutes Intravenous Every 12 hours 05/07/21 1927 05/07/21 1929   05/07/21 1930  vancomycin (VANCOREADY) IVPB 500 mg/100 mL        500 mg 100 mL/hr over 60 Minutes Intravenous  Once 05/07/21 1927 05/07/21 2144   05/07/21 1927  vancomycin variable dose per unstable renal function (pharmacist dosing)  Status:  Discontinued         Does not apply See admin instructions 05/07/21 1927 05/09/21 1448   05/07/21 1600  vancomycin (VANCOCIN) IVPB 1000 mg/200 mL premix        1,000 mg 200  mL/hr over 60 Minutes Intravenous  Once 05/07/21 1552 05/07/21 1801   05/07/21 1600  ceFEPIme (MAXIPIME) 2 g in sodium chloride 0.9 % 100 mL IVPB        2 g 200 mL/hr over 30 Minutes Intravenous  Once 05/07/21 1552 05/07/21 1700        I have personally reviewed the following labs and images: CBC: Recent Labs  Lab 05/16/21 0603 05/17/21 0548 05/18/21 0620 05/19/21 0457 05/20/21 0500  WBC 17.5* 14.9* 15.2* 12.4* 11.8*  HGB 11.0* 10.2* 10.2* 9.6* 9.8*  HCT 33.6* 31.4* 31.5* 30.3* 31.5*  MCV 96.3  94.0 95.2 94.7 96.3  PLT 196 186 184 175 193   BMP &GFR Recent Labs  Lab 05/14/21 0706 05/15/21 0445 05/16/21 0603 05/17/21 0548 05/18/21 0620 05/19/21 0457 05/20/21 0500  NA 143 140 139 135 137 136 139  K 4.0 4.1 3.8 3.9 4.6 4.6 4.5  CL 104 102 99 94* 97* 96* 98  CO2 32 34* 34* 35* 33* 32 35*  GLUCOSE 96 109* 129* 154* 105* 95 104*  BUN 33* 33* 41* 46* 40* 41* 33*  CREATININE 1.13 1.15 1.41* 1.40* 1.16 1.16 1.09  CALCIUM 9.0 8.5* 8.7* 8.5* 8.8* 8.9 9.0  MG 2.1 2.1 1.9 2.0  --   --   --   PHOS  --  3.6 4.5 4.1  --   --   --    Estimated Creatinine Clearance: 56.9 mL/min (by C-G formula based on SCr of 1.09 mg/dL). Liver & Pancreas: No results for input(s): AST, ALT, ALKPHOS, BILITOT, PROT, ALBUMIN in the last 168 hours.  No results for input(s): LIPASE, AMYLASE in the last 168 hours. No results for input(s): AMMONIA in the last 168 hours. Diabetic: No results for input(s): HGBA1C in the last 72 hours. No results for input(s): GLUCAP in the last 168 hours.  Cardiac Enzymes: No results for input(s): CKTOTAL, CKMB, CKMBINDEX, TROPONINI in the last 168 hours. No results for input(s): PROBNP in the last 8760 hours. Coagulation Profile: No results for input(s): INR, PROTIME in the last 168 hours.  Thyroid Function Tests: No results for input(s): TSH, T4TOTAL, FREET4, T3FREE, THYROIDAB in the last 72 hours. Lipid Profile: No results for input(s): CHOL, HDL, LDLCALC,  TRIG, CHOLHDL, LDLDIRECT in the last 72 hours.  Anemia Panel: No results for input(s): VITAMINB12, FOLATE, FERRITIN, TIBC, IRON, RETICCTPCT in the last 72 hours.  Urine analysis:    Component Value Date/Time   COLORURINE YELLOW (A) 05/07/2021 2044   APPEARANCEUR CLEAR (A) 05/07/2021 2044   LABSPEC 1.021 05/07/2021 2044   PHURINE 6.0 05/07/2021 2044   GLUCOSEU NEGATIVE 05/07/2021 2044   HGBUR NEGATIVE 05/07/2021 2044   BILIRUBINUR NEGATIVE 05/07/2021 2044   [place] 05/07/2021 2044   PROTEINUR NEGATIVE 05/07/2021 2044   NITRITE NEGATIVE 05/07/2021 2044   LEUKOCYTESUR NEGATIVE 05/07/2021 2044   Sepsis Labs: Invalid input(s): PROCALCITONIN, [place]  Microbiology: No results found for this or any previous visit (from the past 240 hour(s)).   Radiology Studies: No results found.   Lorella Nimrod, MD [facility]  If 7PM-7AM, please contact night-coverage [email or web address] 05/20/2021, 5:47 PM

## 2021-05-24 NOTE — Progress Notes (Signed)
Physical Therapy Treatment Patient Details Name: Jimmy West MRN: [id number] DOB: 05-Oct-1950 Today's Date: 05/24/2021   History of Present Illness 70 y.o. male was brought to hosp to admit 10/18 for hypoxia, weakness, SOB and noted sepsis from PNA, as well as NSTEMI.  Troponins are high from demand ischemia, EF 40-45%, and has recent asp PNA per chart.  PMHx:  R lower lobe stage IV adenocarcinoma of the lung, chronic anemia, anxiety, depression, presence of Port-A-Cath, cad s/p PCI with stent placement in 2005, tobacco abuse quit in 2005    PT Comments    Patient tolerated session well and was agreeable to treatment. Patient was motivated to participate in session. Patient required 12L O2 via high nasal cannula throughout session and with activity (97% at beginning of session, dropped to 87% after stand pivot transfer from bed>chair, and when standing for 1 minute, however returned to 93% at end of session). Patient was able to perform bed mobility Mod I, and transfers Mod I with RW. Patient required frequent therapeutic rest breaks throughout session due to increased fatigue and increased respiratory rate. With activity patient demonstrated increased work of breathing. Patient was cued on taking deep breaths when O2 dropped below 90% and when patient demonstrated increased work of breathing. Patient would continue to benefit from skilled physical therapy in order to improve overall strength, balance, muscular and cardiovascular endurance.   Recommendations for follow up therapy are one component of a multi-disciplinary discharge planning process, led by the attending physician.  Recommendations may be updated based on patient status, additional functional criteria and insurance authorization.  Follow Up Recommendations  Skilled nursing-short term rehab (<3 hours/day)     Assistance Recommended at Discharge Intermittent Supervision/Assistance  Equipment Recommendations  None recommended  by PT    Recommendations for Other Services       Precautions / Restrictions Precautions Precautions: Fall Precaution Comments: monitor sats and HR Restrictions Other Position/Activity Restrictions: Watch SpO2     Mobility  Bed Mobility Overal bed mobility: Modified Independent Bed Mobility: Supine to Sit     Supine to sit: Modified independent (Device/Increase time)          Transfers Overall transfer level: Needs assistance Equipment used: Rolling walker (2 wheels) Transfers: Stand Pivot Transfers;Sit to/from Stand Sit to Stand: Modified independent (Device/Increase time) (required verbal cueing on proper hand placement)          Lateral/Scoot Transfers: Supervision      Ambulation/Gait                 Stairs             Wheelchair Mobility    Modified Rankin (Stroke Patients Only)       Balance Overall balance assessment: Needs assistance Sitting-balance support: Feet supported;No upper extremity supported Sitting balance-Leahy Scale: Good Sitting balance - Comments: intermittent UE vs BUE support on EOB   Standing balance support: Bilateral upper extremity supported Standing balance-Leahy Scale: Good                              Cognition Arousal/Alertness: Awake/alert Behavior During Therapy: WFL for tasks assessed/performed Overall Cognitive Status: Within Functional Limits for tasks assessed                                          Exercises General Exercises -  Lower Extremity Short Arc Quad: Seated;AROM;Both;10 reps Hip Flexion/Marching: AROM;Both;Seated;20 reps Other Exercises Other Exercises: sup>sit, sit<>standing bed to chair, sit<>stand in chair Other Exercises: x1 minute of standing with lateral weight-shifting; patient required BUE support from RW; completed SBA    General Comments        Pertinent Vitals/Pain Pain Assessment: No/denies pain Pain Location: pt does not provide a  number but states his back "always hurts" but reports being "okay"    Home Living                          Prior Function            PT Goals (current goals can now be found in the care plan section) Acute Rehab PT Goals Patient Stated Goal: to go home PT Goal Formulation: With patient Time For Goal Achievement: 05/25/21 Potential to Achieve Goals: Good Progress towards PT goals: Progressing toward goals    Frequency    Min 2X/week      PT Plan Current plan remains appropriate    Co-evaluation              AM-PAC PT "6 Clicks" Mobility   Outcome Measure  Help needed turning from your back to your side while in a flat bed without using bedrails?: None Help needed moving from lying on your back to sitting on the side of a flat bed without using bedrails?: None Help needed moving to and from a bed to a chair (including a wheelchair)?: A Little Help needed standing up from a chair using your arms (e.g., wheelchair or bedside chair)?: A Little Help needed to walk in hospital room?: A Lot Help needed climbing 3-5 steps with a railing? : Total 6 Click Score: 17    End of Session Equipment Utilized During Treatment: Gait belt Activity Tolerance: Patient tolerated treatment well Patient left: in chair;with call bell/phone within reach;with chair alarm set Nurse Communication: Mobility status PT Visit Diagnosis: Muscle weakness (generalized) (M62.81)     Time: 2563-8937 PT Time Calculation (min) (ACUTE ONLY): 27 min  Charges:  $Therapeutic Exercise: 8-22 mins $Therapeutic Activity: 8-22 mins                     Iva Boop, PT  05/24/21. 5:01 PM

## 2021-05-24 NOTE — Progress Notes (Signed)
Jimmy West   DOB:[birth year]   YF#:[id number]    Subjective: Patient resting in the bed.  Patient has been weaned off from high flow nasal oxygen.  Is currently on nasal cannula oxygen at 12 L.  Overall improved not back to baseline.  Objective:  Vitals:   05/24/21 1111 05/24/21 1203  BP: 105/64   Pulse: 89   Resp: 17   Temp: 97.9 F (36.6 C)   SpO2: 99% 94%     Intake/Output Summary (Last 24 hours) at 05/24/2021 1311 Last data filed at 05/24/2021 1145 Gross per 24 hour  Intake 1200 ml  Output 2370 ml  Net -1170 ml    Physical Exam Vitals and nursing note reviewed.  HENT:     Head: Normocephalic and atraumatic.     Mouth/Throat:     Pharynx: Oropharynx is clear.  Eyes:     Extraocular Movements: Extraocular movements intact.     Pupils: Pupils are equal, round, and reactive to light.  Cardiovascular:     Rate and Rhythm: Normal rate and regular rhythm.  Pulmonary:     Comments: Decreased breath sounds bilaterally.  Abdominal:     Palpations: Abdomen is soft.  Musculoskeletal:        General: Normal range of motion.     Cervical back: Normal range of motion.  Skin:    General: Skin is warm.  Neurological:     General: No focal deficit present.     Mental Status: He is alert and oriented to person, place, and time.  Psychiatric:        Behavior: Behavior normal.        Judgment: Judgment normal.     Labs:  Lab Results  Component Value Date   WBC 10.5 05/22/2021   HGB 9.6 (L) 05/22/2021   HCT 30.0 (L) 05/22/2021   MCV 95.2 05/22/2021   PLT 178 05/22/2021   NEUTROABS 14.0 (H) 05/11/2021    Lab Results  Component Value Date   NA 141 05/22/2021   K 4.4 05/22/2021   CL 101 05/22/2021   CO2 34 (H) 05/22/2021    Studies:  DG Chest Port 1 View  Result Date: 05/24/2021 CLINICAL DATA:  Respiratory failure, shortness of breath EXAM: PORTABLE CHEST 1 VIEW COMPARISON:  Previous studies including the examination of 05/19/2021 FINDINGS: Transverse  diameter of heart is increased. Patchy interstitial and alveolar densities are seen in the parahilar regions and lower lung fields, more so on the right side with no significant interval change. There is no pneumothorax. Tip of chest port catheter is seen in right atrium. IMPRESSION: Cardiomegaly. Interstitial and alveolar densities in both mid and both lower lung fields have not changed. Electronically Signed   By: Elmer Picker M.D.   On: 05/24/2021 09:53    Primary cancer of right lower lobe of lung [facility]) #70 year old male patient history of COPD/chronic respiratory failure-metastatic lung cancer currently on immunotherapy is currently admitted hospital for worsening respiratory distress/diagnosed with pneumonia.  #Right lower lobe lung cancer-adenocarcinoma currently on immunotherapy/Keytruda.  Discussed with the patient that given patient's pneumonitis is likely from [place] will discontinue in immunotherapy further.  We will continue surveillance imaging.  #Acute on chronic respiratory failure / bilateral lower lobe pneumonitis-likely secondary to immunotherapy.  S/p infliximab x1 dose-notes improvement in the respiratory status.  Continue steroid/taper-based on respiratory status over the next 4 to 6 weeks.  Appreciate pulmonary recommendations.  Discussed with Dr. Patsey Berthold.    Cammie Sickle, MD 05/24/2021  1:11 PM

## 2021-05-24 NOTE — Telephone Encounter (Signed)
Pt still currently admitted to [facility] as of 05/24/2021 Hospital course from 19 October till now: Patient has presented with acute on chronic respiratory failure with hypoxia with CT imaging with extensive differential diagnosis Sepsis and PNA Pt is DNR

## 2021-05-24 NOTE — Progress Notes (Signed)
NAME:  Jimmy West, MRN:  [id number], DOB:  01-Feb-1951, LOS: 17 ADMISSION DATE:  05/07/2021, INITIAL CONSULTATION DATE: 05/08/2021 REFERRING MD: Dr. Billie Ruddy ; follow-up requested by Dr. Dwyane Dee CHIEF COMPLAINT: Shortness of breath, bilateral infiltrates.  Follow-up requested from initial consultation on 08 May 2021.  Persistent respiratory failure with hypoxia.  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 Patient confirms DNR/DNI code status today and was followed by palliative care team during previous admission. He has one living brother who lives in [place] and has not spoken to him in years. His preferred contact person is [name] (information in patient records). Patient lives alone and does not have assistance at home.    Patient presented to the ER via EMS last night 10/18 with chief complaint of shortness of breath. Patient reports that he has not felt well since his hospital discharge with profound weakness, too weak to eat, and worsening shortness of breath. He reports that he started wearing his home oxygen all of the time due to not feeling well which helped his pulmonary symptoms some. He states he was not using his prescribed inhaler because the pump was malfunctioning on the handheld device. He also describes wheezing and dry, nonproductive cough which have worsened in the last several days. He denies recent fever, chills, sweating, nausea, vomiting, swelling. He reports small volume loose stools occasionally which is not his  routine bowel movements.    On arrival to the ER, patient with noted oxygen saturations in the 80s on room air by EMS with blood pressure systolic in the 76A after a 200cc fluid bolus. Vital signs on arrival pulse 97, BP 103/74, RR 36, oxygen saturations 91% on 6L, afebrile. Patient noted to easily desaturate with activity, speaking, and any interruption to his oxygen flow such as when he was transitioned to 8L high flow nasal cannula with humidifier. Labs noted WBC 16.6, hgb 9.9, Creatinine 1.71, BNP 1105, troponin 3153 and 3506 on repeat. COVID and flu screening negative. Imaging: CXR findings of new left lower lobe airspace process suspicious for pneumonia with extensive right lower lobe density consistent with radiation change. D-dimer was elevated, and CTA of the chest was done which was negative for PE. Patient had sepsis bundle initiated and received 2 liters LR fluid resuscitation, empiric vancomycin and cefepime in the ER. He was started on a heparin continuous infusion for NSTEMI suspected demand ischemia on history of CAD with remote PCI.    PCCM was consulted to assess patient. He is currently being seen in the ER pending bed placement inpatient, tolerating 8 liters highflow oxygen cannula at oxygen saturations 92-93%; however, he has noted increased work of breathing with accessory muscles of breathing use, shortness of breath worsening with conversation with short shallow breaths between words. Patient is alert, oriented to self, location, situation and time. He denies any other recent events other than what is described above. He has never used a BiPAP or other NIPPV support device before but is agreeable to trial on BiPAP for a short duration if his respiratory condition worsens. He confirms DNI status as part of  DNR and that he would not want intubation to support his breathing.   Hospital course from 19 October - 26 Oct : Patient has presented with acute on chronic respiratory failure with  hypoxia with CT imaging with extensive differential diagnosis.  He has been treated as "worsening pneumonia" with increasing FiO2 requirements in the process.  He is currently on steroids.  Had initial improvement on symptoms however, now has seemed to plateau.  We are asked to render opinion as to how long steroids will be necessary.  He is currently requiring high flow O2 at 60%.  Pertinent  Medical History   Stage IV adenocarcinoma of the lung Myocardial infarction in the past Remote history of PE on Eliquis CAD hx PCI 2005 Chronic anemia secondary to cancer treatment Port-a-cath right chest Recently discharged 04/28/21 after treatment of aspiration pneumonia  Significant Hospital Events: Including procedures, antibiotic start and stop dates in addition to other pertinent events   10/18: Brought by EMS to ER with cc: sob, hypoxic sats 80% on RA and hypotensive. CXR concerning for LUL pna, sepsis with NSTEMI demand ischemia on workup. Started on cefepime, vancomycin, heparin continuous infusion and assigned to hospitalist service pending bed placement.  10/19: Evaluated by PCCM significant pulm history with recurrent pneumonia ? Aspiration versus HCAP, sepsis, NSTEMI, hypotension. Stable sats on 8lpm highflow oxygen, with increased WOB and accessory muscle use.  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  On bubble high flow at 12 L/min  Interim History / Subjective:  He can feel states he can breathe much easier.  Oxygen requirements have decreased now  on bubble high flow.  Continues to clear secretions well.  No conversational dyspnea.  States he feels "stronger".  Objective   Blood pressure 108/79, pulse 73, temperature 98 F (36.7 C), temperature source Oral, resp. rate 17, height _0  (1.676 m), weight 64 kg, SpO2 100 %.    SpO2: 100 % O2 Flow Rate (L/min): 12 L/min FiO2 (%): 45 %  Intake/Output Summary (Last 24 hours) at 05/24/2021 1059 Last data filed at 05/24/2021 1045 Gross per 24 hour  Intake 1440 ml  Output 1620 ml  Net -180 ml    Filed Weights   05/07/21 1439 05/10/21 0901 05/10/21 1537  Weight: 58.4 kg 63.2 kg 64 kg    Examination: GENERAL: Chronically ill-appearing gentleman, sitting up in bed, on bubble high flow O2 via nasal cannula, no conversational dyspnea noted today.  Conversant.   HEAD: Normocephalic, atraumatic.  EYES: Pupils equal, round, reactive to light.  No scleral icterus.  MOUTH: Oral mucosa moist. NECK: Supple. No thyromegaly. Trachea midline. No JVD.  No adenopathy. PULMONARY: Good air entry bilaterally.  Coarse throughout, no other adventitious sounds. CARDIOVASCULAR: S1 and S2. Regular rate and rhythm.  No rubs, murmurs or gallops heard. ABDOMEN: Scaphoid, otherwise benign. MUSCULOSKELETAL: No joint deformity, no clubbing, no edema.  Diffuse muscle wasting. NEUROLOGIC: No overt focal deficit, gait not tested.   SKIN: Intact,warm,dry. PSYCH: Conversant, jovial.  Mood and behavior normal.  Resolved Hospital Problem list   N/A  Assessment & Plan:  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 DDX: Steroid refractory ICI toxicity (grade 3), lymphangitic spread of CA (not entirely ruled out) - Titrate oxygen supplementation to maintain O2 sats 88-92% - Has been titrated to bubble high flow 12 L - Continue Brovana with Pulmicort twice a day  via neb - Continue Yupelri once daily via neb - Continue prednisone at 30 mg daily (started 11/2), taper as O2 requirements decrease - PJP prophylaxis Bactrim DS Monday Wednesday Friday - Will need taper of steroids over 4 to 6-week period of time - Procalcitonin's have been negative, KL6, pending - Fungitell negative - Immunoglobulins show modest decrease in IgG=458, consider IVIG if no further improvement - Inflammatory markers are elevated (ESR/CRP), LDH 239 - Repeated CT chest 10/26, persistent interstitial and airspace process with no significant change - Significant clinical improvement over last 48 hours - Received infliximab 5 mg/kg x 1, 11/2, repeat in 2- 3 weeks x1  - Repeat CT chest noncontrasted in 2 to 3 weeks prior to infliximab second dose  Pneumonia ruled out Procalcitonin is normal Persistent findings on CT with differential as above -No need for antibiotics at present except for PJP prophylaxis -Aspiration precautions -Leukocytosis likely due to steroids, WBC 10.5 today   ACUTE KIDNEY INJURY improving - Avoid nephrotoxic agents as able - Follow urine output, BMP - Monitor closely on PJP prophylaxis - Ensure adequate renal perfusion, optimize oxygenation  Anemia, chronic, secondary to cancer treatment - stable - Monitor routine CBC   Elevated d-dimer  History PE on Eliquis - CTA Chest 10/18 negative for PE - High risk for PE recurrence due to underlying malignancy - Continue Eliquis    Radiologic studies  Chest x-ray today:  Persistent infiltrates, cardiomegaly, slight increased aeration.  Labs   CBC: Recent Labs  Lab 05/18/21 0620 05/19/21 0457 05/20/21 0500 05/21/21 0512 05/22/21 0625  WBC 15.2* 12.4* 11.8* 10.9* 10.5  HGB 10.2* 9.6* 9.8* 10.0* 9.6*  HCT 31.5* 30.3* 31.5* 31.7* 30.0*  MCV 95.2 94.7 96.3 96.4 95.2  PLT 184 175 193 191 178     Basic Metabolic Panel: Recent Labs  Lab 05/18/21 0620 05/19/21 0457 05/20/21 0500  05/21/21 0512 05/22/21 0625  NA 137 136 139 137 141  K 4.6 4.6 4.5 4.5 4.4  CL 97* 96* 98 96* 101  CO2 33* 32 35* 34* 34*  GLUCOSE 105* 95 104* 91 88  BUN 40* 41* 33* 33* 33*  CREATININE 1.16 1.16 1.09 1.07 0.94  CALCIUM 8.8* 8.9 9.0 9.2 9.1  MG  --   --   --  1.8  --   PHOS  --   --   --  4.0  --     GFR: Estimated Creatinine Clearance: 66 mL/min (by C-G formula based on SCr of 0.94 mg/dL). Recent Labs  Lab 05/19/21 0457 05/20/21 0500 05/21/21 0512 05/22/21 0625  WBC 12.4* 11.8* 10.9* 10.5     Liver Function Tests: No results for input(s): AST, ALT, ALKPHOS, BILITOT, PROT, ALBUMIN in the last 168 hours.  No results for input(s): LIPASE, AMYLASE in the last 168 hours. No results for input(s): AMMONIA in the last 168 hours.  ABG    Component Value Date/Time   PHART 7.51 (H) 05/14/2021 1459   PCO2ART 38 05/14/2021 1459   PO2ART 51 (L) 05/14/2021 1459   HCO3 30.3 (H) 05/14/2021  1459   O2SAT 89.4 05/14/2021 1459     CBG: Recent Labs  Lab 05/21/21 1606  GLUCAP 159*     Review of Systems:   A 10 point review of systems was performed and it is as noted above otherwise negative.  Allergies No Known Allergies   Scheduled Meds:  apixaban  5 mg Oral BID   arformoterol  15 mcg Nebulization BID   atorvastatin  40 mg Oral Daily   budesonide (PULMICORT) nebulizer solution  0.5 mg Nebulization BID   Chlorhexidine Gluconate Cloth  6 each Topical Daily   feeding supplement  237 mL Oral TID BM   FLUoxetine  40 mg Oral Daily   guaiFENesin  1,200 mg Oral BID   OLANZapine  10 mg Oral QHS   predniSONE  30 mg Oral Q breakfast   revefenacin  175 mcg Nebulization Daily   sulfamethoxazole-trimethoprim  1 tablet Oral Once per day on Mon Wed Fri   Continuous Infusions:  sodium chloride 10 mL/hr (05/13/21 1620)   PRN Meds:.sodium chloride, acetaminophen, ALPRAZolam, alum & mag hydroxide-simeth, docusate sodium, ondansetron (ZOFRAN) IV, ondansetron, oxyCODONE, polyethylene  glycol, sodium chloride Received infliximab x1 today  Level 2 follow-up    Discussion: Patient appears to have more of an interstitial process that is failing to improve this is either 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  At this point ICI induced pneumonitis more likely.  KL- 6 has been shown useful in determining ILD from other modalities, however this may take a few weeks to come back. Continue prednisone at decreased dose of 30 mg daily, started 11/2.  He will need a 4 to 6-week slow taper.  Continue to taper off as O2 requirements decrease.  Continue PJP prophylaxis (MWF).  Challenged with infliximab 5 mg/kg x 1 11/2.  Showing improvement since then.  Continue nebulization treatments and pulmonary hygiene.  He does have modest decrease of IgG however, may be due to acute illness.  However, if his clinical picture fails to improve significantly after infliximab may consider IVIG infusion.  We will see back on Monday, November 7.  Please call PCCM on call over weekend with any questions or concerns.  Renold Don, MD Advanced Bronchoscopy PCCM [facility]    *This note was dictated using voice recognition software/Dragon.  Despite best efforts to proofread, errors can occur which can change the meaning.  Any change was purely unintentional.

## 2021-05-25 DIAGNOSIS — C3431 Malignant neoplasm of lower lobe, right bronchus or lung: Secondary | ICD-10-CM | POA: Diagnosis not present

## 2021-05-25 DIAGNOSIS — J189 Pneumonia, unspecified organism: Secondary | ICD-10-CM | POA: Diagnosis not present

## 2021-05-25 DIAGNOSIS — J9601 Acute respiratory failure with hypoxia: Secondary | ICD-10-CM | POA: Diagnosis not present

## 2021-05-25 DIAGNOSIS — T50905A Adverse effect of unspecified drugs, medicaments and biological substances, initial encounter: Secondary | ICD-10-CM | POA: Diagnosis not present

## 2021-05-25 LAB — CBC
HCT: 30.7 % — ABNORMAL LOW (ref 39.0–52.0)
Hemoglobin: 9.8 g/dL — ABNORMAL LOW (ref 13.0–17.0)
MCH: 31 pg (ref 26.0–34.0)
MCHC: 31.9 g/dL (ref 30.0–36.0)
MCV: 97.2 fL (ref 80.0–100.0)
Platelets: 161 10*3/uL (ref 150–400)
RBC: 3.16 MIL/uL — ABNORMAL LOW (ref 4.22–5.81)
RDW: 17.2 % — ABNORMAL HIGH (ref 11.5–15.5)
WBC: 10.4 10*3/uL (ref 4.0–10.5)
nRBC: 0 % (ref 0.0–0.2)

## 2021-05-25 NOTE — Progress Notes (Signed)
PROGRESS NOTE  Kekai Geter RFF:[id number] DOB: 1951/05/11   PCP: Pcp, No  Patient is from: Home.  Uses rolling walker at baseline.  DOA: 05/07/2021 LOS: 70  Chief complaints:  Chief Complaint  Patient presents with   Shortness of Breath     Brief Narrative / Interim history: 70 year old M with PMH of stage IV RLL lung cancer, cancer pain, CAD/stent in 2005, chronic respiratory failure with hypoxia on 2 L, anemia of chronic disease, anxiety, depression and debility presenting with generalized weakness, shortness of breath and dry cough, and admitted for acute on chronic hypoxic respiratory failure in the setting of LLL pneumonia and non-STEMI.  Started on broad-spectrum antibiotics, steroid and IV heparin.  Cardiology consulted recommended medical management and [place] outpatient.  CTA chest negative for PE.  10/25 patient became hypoxic and required directed oxygen, started high flow nasal cannula, FiO2 54%, inpatient setting around 90s  11/1: Patient continued to require heated high flow at 40L and 45% FiO2, clinically seems stable.  Pulmonology is following.  No obvious infection.  Completed the course of antibiotics.  Currently procalcitonin negative.  Per pulmonary note differential can be Pemetrexed/Pembrolizumab toxicity (grade 3), lymphangitic spread of CA (not entirely ruled out).  Will require long taper of steroid. They are also recommending trial of infliximab-patient received first dose on 05/22/2021  11/3: Patient with significant improvement in oxygen requirement after getting infliximab yesterday, now saturating in mid 90s on 15L of oxygen with high flow.  11/4: Oxygenation continues to improve, still not at baseline.  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  Oncology saw him today and discontinued the immune therapy and they will just do surveillance at this time. Patient will need second dose of infliximab in 2 to 3 weeks after having a  repeat CT chest.  Subjective: Patient continued to improve and feeling stronger.  Still requiring 8 L of oxygen which is slightly above the margin where he can be transferred to SNF.  Objective: Vitals:   05/20/21 1200 05/20/21 1317 05/20/21 1333 05/20/21 1551  BP: 116/65   91/71  Pulse: 98   87  Resp: 18   17  Temp: 98.1 F (36.7 C)   98.3 F (36.8 C)  TempSrc:      SpO2: 92% 90% 91% 96%  Weight:      Height:        Intake/Output Summary (Last 24 hours) at 05/20/2021 1747 Last data filed at 05/20/2021 1047 Gross per 24 hour  Intake 480 ml  Output 2700 ml  Net -2220 ml   Filed Weights   05/07/21 1439 05/10/21 0901 05/10/21 1537  Weight: 58.4 kg 63.2 kg 64 kg    Examination:  General.  Emaciated gentleman, in no acute distress. Pulmonary.  Lungs clear bilaterally, normal respiratory effort. CV.  Regular rate and rhythm, no JVD, rub or murmur. Abdomen.  Soft, nontender, nondistended, BS positive. CNS.  Alert and oriented .  No focal neurologic deficit. Extremities.  No edema, no cyanosis, pulses intact and symmetrical. Psychiatry.  Judgment and insight appears normal.   Procedures:  None  Microbiology summarized: COVID-19 and influenza PCR nonreactive. MRSA PCR screen negative. Blood cultures NGTD.  Assessment & Plan: Acute on chronic respiratory failure with hypoxia due to LLL pneumonia with superimposed stage IV RLL lung cancer-on 2 L at baseline.  Oxygen requirement improved to 15 L with high flow after getting infliximab yesterday. .  CTA chest negative for PE but concern for LLL pneumonia with  RLL scarring from known lung cancer.   Blood cultures negative. On cefepime since 10/18 making today day 7. S/p vancomycin. Oxygen requirement seems stable at this time - s/p Cefepime completed 7 days. S/p azithromycin 500 x 3 doses  - check CXR to eval for parapneumonic effusion which would require extended course abx - stop solumedrol, start oral prednisone, plan to  wean -Wean oxygen as able. -IS/OOB/PT/OT - palliative consulted - Dr. B of oncology aware of the patient -Pulmonology was also consulted and there is a concern of lymphangitis spread versus drug toxicity. -Pulmonary is recommending prolonged taper of steroid. -Received first dose of infliximab and repeat in 2 weeks as he did showed improvement. -Per pulmonology they will repeat infliximab in 2 to 3 weeks and he will need a repeat CT chest before getting the second dose. -Oncology stopped immune therapy due to development of pneumonitis, stating we will just do surveillance at this time.  Severe sepsis due to pneumonia:tachycardia, tachypnea, leukocytosis, AKI with pneumonia POA. Sepsis physiology resolved -Management as above.   NSTEMI/history of CAD/stent in 2005: Troponin 3153 -> 3506 -> 2927 -> 2894.  EKG with T wave changes in lateral leads but no dynamic changes.  TTE with LVEF of 55 to 60%, G1-DD and no RWMA chest pain-free.  -Completed 48 hours of IV heparin and transition back to home Eliquis. -Medical management due to poor candidacy for heart cath in the setting of respiratory failure -Cardiology recommends outpatient follow-up when stable from respiratory standpoint.  Chronic diastolic CHF: TTE as above.  Appears euvolemic on exam.  Does not seem to be on diuretics at home.  About 2100 ml UOP/24 hours. -Monitor fluid status   AKI/azotemia on CKD-3A: Improved.  Does not seem to be on nephrotoxic meds.  Could be hemodynamically mediated Recent Labs    05/11/21 0555 05/12/21 0530 05/13/21 0515 05/14/21 0706 05/15/21 0445 05/16/21 0603 05/17/21 0548 05/18/21 0620 05/19/21 0457 05/20/21 0500  BUN 42* 48* 41* 33* 33* 41* 46* 40* 41* 33*  CREATININE 1.39* 1.32* 1.22 1.13 1.15 1.41* 1.40* 1.16 1.16 1.09  -Continue monitoring -Avoid nephrotoxic meds.  Hypotension: Resolved.   Stage IV lung cancer s/p radiation and chemotherapy previously.   -Outpatient follow-up -  oncology following    Anemia of chronic disease: H&H stable after 1 unit Recent Labs    05/09/21 0701 05/10/21 0517 05/11/21 0555 05/12/21 0530 05/15/21 0445 05/16/21 0603 05/17/21 0548 05/18/21 0620 05/19/21 0457 05/20/21 0500  HGB 8.1* 7.6* 9.3* 9.8* 10.3* 11.0* 10.2* 10.2* 9.6* 9.8*  -Continue monitoring   Chronic pain syndrome: -Continue oxycodone and bowel regimen as needed.   Depression and Anxiety: Stable -Continue with Fluoxetine 40 g p.o. daily as well as Olanzapine 10 mg p.o. nightly   Hx of PE: CTA chest negative for PE. -Continue home Eliquis  Physical deconditioning-uses rolling walker at baseline. -Therapy recommends SNF  Leukocytosis/bandemia-likely from steroid.  Goal of care counseling-patient with comorbidity as above.  Very frail and deconditioned.  Poor long-term prognosis.  Appropriately DNR/DNI.  A candidate for hospice -Palliative medicine consulted.  Body mass index is 22.77 kg/m.    DVT prophylaxis:  apixaban (ELIQUIS) tablet 5 mg  Code Status: DNR/DNI Family Communication:    Level of care: Progressive Cardiac Status is: Inpatient  Remains inpatient appropriate because: Due to significant oxygen requirement, work of breathing and need for IV medications and close monitoring Patient is currently on high flow nasal cannula at 12 L, improving but still not at baseline.  Consultants:  Cardiology Pulmonology Palliative medicine Oncology  Sch Meds:  Scheduled Meds:  apixaban  5 mg Oral BID   atorvastatin  40 mg Oral Daily   budesonide (PULMICORT) nebulizer solution  0.5 mg Nebulization BID   Chlorhexidine Gluconate Cloth  6 each Topical Daily   feeding supplement  237 mL Oral TID BM   FLUoxetine  40 mg Oral Daily   fluticasone furoate-vilanterol  1 puff Inhalation Daily   guaiFENesin  1,200 mg Oral BID   ipratropium-albuterol  3 mL Nebulization Q6H   OLANZapine  10 mg Oral QHS   predniSONE  40 mg Oral Q breakfast    sulfamethoxazole-trimethoprim  1 tablet Oral Once per day on Mon Wed Fri   Continuous Infusions:  sodium chloride 10 mL/hr (05/13/21 1620)   PRN Meds:.sodium chloride, acetaminophen, ALPRAZolam, alum & mag hydroxide-simeth, docusate sodium, guaiFENesin-dextromethorphan, ondansetron (ZOFRAN) IV, ondansetron, oxyCODONE, polyethylene glycol, sodium chloride  Antimicrobials: Anti-infectives (From admission, onward)    Start     Dose/Rate Route Frequency Ordered Stop   05/17/21 0900  sulfamethoxazole-trimethoprim (BACTRIM DS) 800-160 MG per tablet 1 tablet        1 tablet Oral Once per day on Mon Wed Fri 05/16/21 0935     05/13/21 1030  ceFEPIme (MAXIPIME) 2 g in sodium chloride 0.9 % 100 mL IVPB        2 g 200 mL/hr over 30 Minutes Intravenous Every 12 hours 05/13/21 0952 05/13/21 1959   05/11/21 1630  azithromycin (ZITHROMAX) 500 mg in sodium chloride 0.9 % 250 mL IVPB        500 mg 250 mL/hr over 60 Minutes Intravenous Every 24 hours 05/11/21 1537 05/13/21 1721   05/08/21 1800  vancomycin (VANCOREADY) IVPB 750 mg/150 mL  Status:  Discontinued        750 mg 150 mL/hr over 60 Minutes Intravenous Every 24 hours 05/08/21 1135 05/09/21 1448   05/08/21 0800  ceFEPIme (MAXIPIME) 2 g in sodium chloride 0.9 % 100 mL IVPB  Status:  Discontinued        2 g 200 mL/hr over 30 Minutes Intravenous Every 12 hours 05/07/21 1929 05/13/21 0952   05/07/21 2200  ceFEPIme (MAXIPIME) 2 g in sodium chloride 0.9 % 100 mL IVPB  Status:  Discontinued        2 g 200 mL/hr over 30 Minutes Intravenous Every 12 hours 05/07/21 1927 05/07/21 1929   05/07/21 1930  vancomycin (VANCOREADY) IVPB 500 mg/100 mL        500 mg 100 mL/hr over 60 Minutes Intravenous  Once 05/07/21 1927 05/07/21 2144   05/07/21 1927  vancomycin variable dose per unstable renal function (pharmacist dosing)  Status:  Discontinued         Does not apply See admin instructions 05/07/21 1927 05/09/21 1448   05/07/21 1600  vancomycin (VANCOCIN) IVPB  1000 mg/200 mL premix        1,000 mg 200 mL/hr over 60 Minutes Intravenous  Once 05/07/21 1552 05/07/21 1801   05/07/21 1600  ceFEPIme (MAXIPIME) 2 g in sodium chloride 0.9 % 100 mL IVPB        2 g 200 mL/hr over 30 Minutes Intravenous  Once 05/07/21 1552 05/07/21 1700        I have personally reviewed the following labs and images: CBC: Recent Labs  Lab 05/16/21 0603 05/17/21 0548 05/18/21 0620 05/19/21 0457 05/20/21 0500  WBC 17.5* 14.9* 15.2* 12.4* 11.8*  HGB 11.0* 10.2* 10.2* 9.6* 9.8*  HCT  33.6* 31.4* 31.5* 30.3* 31.5*  MCV 96.3 94.0 95.2 94.7 96.3  PLT 196 186 184 175 193   BMP &GFR Recent Labs  Lab 05/14/21 0706 05/15/21 0445 05/16/21 0603 05/17/21 0548 05/18/21 0620 05/19/21 0457 05/20/21 0500  NA 143 140 139 135 137 136 139  K 4.0 4.1 3.8 3.9 4.6 4.6 4.5  CL 104 102 99 94* 97* 96* 98  CO2 32 34* 34* 35* 33* 32 35*  GLUCOSE 96 109* 129* 154* 105* 95 104*  BUN 33* 33* 41* 46* 40* 41* 33*  CREATININE 1.13 1.15 1.41* 1.40* 1.16 1.16 1.09  CALCIUM 9.0 8.5* 8.7* 8.5* 8.8* 8.9 9.0  MG 2.1 2.1 1.9 2.0  --   --   --   PHOS  --  3.6 4.5 4.1  --   --   --    Estimated Creatinine Clearance: 56.9 mL/min (by C-G formula based on SCr of 1.09 mg/dL). Liver & Pancreas: No results for input(s): AST, ALT, ALKPHOS, BILITOT, PROT, ALBUMIN in the last 168 hours.  No results for input(s): LIPASE, AMYLASE in the last 168 hours. No results for input(s): AMMONIA in the last 168 hours. Diabetic: No results for input(s): HGBA1C in the last 72 hours. No results for input(s): GLUCAP in the last 168 hours.  Cardiac Enzymes: No results for input(s): CKTOTAL, CKMB, CKMBINDEX, TROPONINI in the last 168 hours. No results for input(s): PROBNP in the last 8760 hours. Coagulation Profile: No results for input(s): INR, PROTIME in the last 168 hours.  Thyroid Function Tests: No results for input(s): TSH, T4TOTAL, FREET4, T3FREE, THYROIDAB in the last 72 hours. Lipid Profile: No  results for input(s): CHOL, HDL, LDLCALC, TRIG, CHOLHDL, LDLDIRECT in the last 72 hours.  Anemia Panel: No results for input(s): VITAMINB12, FOLATE, FERRITIN, TIBC, IRON, RETICCTPCT in the last 72 hours.  Urine analysis:    Component Value Date/Time   COLORURINE YELLOW (A) 05/07/2021 2044   APPEARANCEUR CLEAR (A) 05/07/2021 2044   LABSPEC 1.021 05/07/2021 2044   PHURINE 6.0 05/07/2021 2044   GLUCOSEU NEGATIVE 05/07/2021 2044   HGBUR NEGATIVE 05/07/2021 2044   BILIRUBINUR NEGATIVE 05/07/2021 2044   [place] 05/07/2021 2044   PROTEINUR NEGATIVE 05/07/2021 2044   NITRITE NEGATIVE 05/07/2021 2044   LEUKOCYTESUR NEGATIVE 05/07/2021 2044   Sepsis Labs: Invalid input(s): PROCALCITONIN, [place]  Microbiology: No results found for this or any previous visit (from the past 240 hour(s)).   Radiology Studies: No results found.   Lorella Nimrod, MD [facility]  If 7PM-7AM, please contact night-coverage [email or web address] 05/20/2021, 5:47 PM

## 2021-05-26 DIAGNOSIS — J9601 Acute respiratory failure with hypoxia: Secondary | ICD-10-CM | POA: Diagnosis not present

## 2021-05-26 DIAGNOSIS — J189 Pneumonia, unspecified organism: Secondary | ICD-10-CM | POA: Diagnosis not present

## 2021-05-26 DIAGNOSIS — T50905A Adverse effect of unspecified drugs, medicaments and biological substances, initial encounter: Secondary | ICD-10-CM | POA: Diagnosis not present

## 2021-05-26 DIAGNOSIS — C3431 Malignant neoplasm of lower lobe, right bronchus or lung: Secondary | ICD-10-CM | POA: Diagnosis not present

## 2021-05-26 NOTE — Progress Notes (Signed)
PROGRESS NOTE  Jimmy West DXI:[id number] DOB: [birth year]   PCP: Pcp, No  Patient is from: Home.  Uses rolling walker at baseline.  DOA: 05/07/2021 LOS: 58  Chief complaints:  Chief Complaint  Patient presents with   Shortness of Breath     Brief Narrative / Interim history: 70 year old M with PMH of stage IV RLL lung cancer, cancer pain, CAD/stent in 2005, chronic respiratory failure with hypoxia on 2 L, anemia of chronic disease, anxiety, depression and debility presenting with generalized weakness, shortness of breath and dry cough, and admitted for acute on chronic hypoxic respiratory failure in the setting of LLL pneumonia and non-STEMI.  Started on broad-spectrum antibiotics, steroid and IV heparin.  Cardiology consulted recommended medical management and [place] outpatient.  CTA chest negative for PE.  10/25 patient became hypoxic and required directed oxygen, started high flow nasal cannula, FiO2 54%, inpatient setting around 90s  11/1: Patient continued to require heated high flow at 40L and 45% FiO2, clinically seems stable.  Pulmonology is following.  No obvious infection.  Completed the course of antibiotics.  Currently procalcitonin negative.  Per pulmonary note differential can be Pemetrexed/Pembrolizumab toxicity (grade 3), lymphangitic spread of CA (not entirely ruled out).  Will require long taper of steroid. They are also recommending trial of infliximab-patient received first dose on 05/22/2021  11/3: Patient with significant improvement in oxygen requirement after getting infliximab yesterday, now saturating in mid 90s on 15L of oxygen with high flow.  11/4: Oxygenation continues to improve, still not at baseline.  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  Oncology saw him today and discontinued the immune therapy and they will just do surveillance at this time. Patient will need second dose of infliximab in 2 to 3 weeks after having a  repeat CT chest.  Subjective: Patient continued to improve, now on 6 L of oxygen.  No new complaints.  Objective: Vitals:   05/20/21 1200 05/20/21 1317 05/20/21 1333 05/20/21 1551  BP: 116/65   91/71  Pulse: 98   87  Resp: 18   17  Temp: 98.1 F (36.7 C)   98.3 F (36.8 C)  TempSrc:      SpO2: 92% 90% 91% 96%  Weight:      Height:        Intake/Output Summary (Last 24 hours) at 05/20/2021 1747 Last data filed at 05/20/2021 1047 Gross per 24 hour  Intake 480 ml  Output 2700 ml  Net -2220 ml   Filed Weights   05/07/21 1439 05/10/21 0901 05/10/21 1537  Weight: 58.4 kg 63.2 kg 64 kg    Examination:  General.  Frail gentleman, in no acute distress. Pulmonary.  Few coarse breath sounds bilaterally, normal respiratory effort. CV.  Regular rate and rhythm, no JVD, rub or murmur. Abdomen.  Soft, nontender, nondistended, BS positive. CNS.  Alert and oriented . No focal neurologic deficit. Extremities.  No edema, no cyanosis, pulses intact and symmetrical. Psychiatry.  Judgment and insight appears normal.   Procedures:  None  Microbiology summarized: COVID-19 and influenza PCR nonreactive. MRSA PCR screen negative. Blood cultures NGTD.  Assessment & Plan: Acute on chronic respiratory failure with hypoxia due to LLL pneumonia with superimposed stage IV RLL lung cancer-on 2 L at baseline.  Oxygen requirement improved to 15 L with high flow after getting infliximab yesterday. .  CTA chest negative for PE but concern for LLL pneumonia with RLL scarring from known lung cancer.   Blood cultures negative. On  cefepime since 10/18 making today day 7. S/p vancomycin. Oxygen requirement seems stable at this time - s/p Cefepime completed 7 days. S/p azithromycin 500 x 3 doses  - check CXR to eval for parapneumonic effusion which would require extended course abx - stop solumedrol, start oral prednisone, plan to wean -Wean oxygen as able. -IS/OOB/PT/OT - palliative consulted - Dr.  B of oncology aware of the patient -Pulmonology was also consulted and there is a concern of lymphangitis spread versus drug toxicity. -Pulmonary is recommending prolonged taper of steroid. -Received first dose of infliximab and repeat in 2 weeks as he did showed improvement. -Per pulmonology they will repeat infliximab in 2 to 3 weeks and he will need a repeat CT chest before getting the second dose. -Oncology stopped immune therapy due to development of pneumonitis, stating we will just do surveillance at this time.  Severe sepsis due to pneumonia:tachycardia, tachypnea, leukocytosis, AKI with pneumonia POA. Sepsis physiology resolved -Management as above.   NSTEMI/history of CAD/stent in 2005: Troponin 3153 -> 3506 -> 2927 -> 2894.  EKG with T wave changes in lateral leads but no dynamic changes.  TTE with LVEF of 55 to 60%, G1-DD and no RWMA chest pain-free.  -Completed 48 hours of IV heparin and transition back to home Eliquis. -Medical management due to poor candidacy for heart cath in the setting of respiratory failure -Cardiology recommends outpatient follow-up when stable from respiratory standpoint.  Chronic diastolic CHF: TTE as above.  Appears euvolemic on exam.  Does not seem to be on diuretics at home.  About 2100 ml UOP/24 hours. -Monitor fluid status   AKI/azotemia on CKD-3A: Improved.  Does not seem to be on nephrotoxic meds.  Could be hemodynamically mediated Recent Labs    05/11/21 0555 05/12/21 0530 05/13/21 0515 05/14/21 0706 05/15/21 0445 05/16/21 0603 05/17/21 0548 05/18/21 0620 05/19/21 0457 05/20/21 0500  BUN 42* 48* 41* 33* 33* 41* 46* 40* 41* 33*  CREATININE 1.39* 1.32* 1.22 1.13 1.15 1.41* 1.40* 1.16 1.16 1.09  -Continue monitoring -Avoid nephrotoxic meds.  Hypotension: Resolved.   Stage IV lung cancer s/p radiation and chemotherapy previously.   -Outpatient follow-up - oncology following    Anemia of chronic disease: H&H stable after 1  unit Recent Labs    05/09/21 0701 05/10/21 0517 05/11/21 0555 05/12/21 0530 05/15/21 0445 05/16/21 0603 05/17/21 0548 05/18/21 0620 05/19/21 0457 05/20/21 0500  HGB 8.1* 7.6* 9.3* 9.8* 10.3* 11.0* 10.2* 10.2* 9.6* 9.8*  -Continue monitoring   Chronic pain syndrome: -Continue oxycodone and bowel regimen as needed.   Depression and Anxiety: Stable -Continue with Fluoxetine 40 g p.o. daily as well as Olanzapine 10 mg p.o. nightly   Hx of PE: CTA chest negative for PE. -Continue home Eliquis  Physical deconditioning-uses rolling walker at baseline. -Therapy recommends SNF  Leukocytosis/bandemia-likely from steroid.  Goal of care counseling-patient with comorbidity as above.  Very frail and deconditioned.  Poor long-term prognosis.  Appropriately DNR/DNI.  A candidate for hospice -Palliative medicine consulted.  Body mass index is 22.77 kg/m.    DVT prophylaxis:  apixaban (ELIQUIS) tablet 5 mg  Code Status: DNR/DNI Family Communication: Discussed with POA on phone.   Level of care: Progressive Cardiac Status is: Inpatient  Remains inpatient appropriate because: Due to significant oxygen requirement, work of breathing and need for IV medications and close monitoring Patient is currently on high flow nasal cannula at 12 L, improving but still not at baseline.   Consultants:  Cardiology Pulmonology Palliative medicine  Oncology  Sch Meds:  Scheduled Meds:  apixaban  5 mg Oral BID   atorvastatin  40 mg Oral Daily   budesonide (PULMICORT) nebulizer solution  0.5 mg Nebulization BID   Chlorhexidine Gluconate Cloth  6 each Topical Daily   feeding supplement  237 mL Oral TID BM   FLUoxetine  40 mg Oral Daily   fluticasone furoate-vilanterol  1 puff Inhalation Daily   guaiFENesin  1,200 mg Oral BID   ipratropium-albuterol  3 mL Nebulization Q6H   OLANZapine  10 mg Oral QHS   predniSONE  40 mg Oral Q breakfast   sulfamethoxazole-trimethoprim  1 tablet Oral Once per  day on Mon Wed Fri   Continuous Infusions:  sodium chloride 10 mL/hr (05/13/21 1620)   PRN Meds:.sodium chloride, acetaminophen, ALPRAZolam, alum & mag hydroxide-simeth, docusate sodium, guaiFENesin-dextromethorphan, ondansetron (ZOFRAN) IV, ondansetron, oxyCODONE, polyethylene glycol, sodium chloride  Antimicrobials: Anti-infectives (From admission, onward)    Start     Dose/Rate Route Frequency Ordered Stop   05/17/21 0900  sulfamethoxazole-trimethoprim (BACTRIM DS) 800-160 MG per tablet 1 tablet        1 tablet Oral Once per day on Mon Wed Fri 05/16/21 0935     05/13/21 1030  ceFEPIme (MAXIPIME) 2 g in sodium chloride 0.9 % 100 mL IVPB        2 g 200 mL/hr over 30 Minutes Intravenous Every 12 hours 05/13/21 0952 05/13/21 1959   05/11/21 1630  azithromycin (ZITHROMAX) 500 mg in sodium chloride 0.9 % 250 mL IVPB        500 mg 250 mL/hr over 60 Minutes Intravenous Every 24 hours 05/11/21 1537 05/13/21 1721   05/08/21 1800  vancomycin (VANCOREADY) IVPB 750 mg/150 mL  Status:  Discontinued        750 mg 150 mL/hr over 60 Minutes Intravenous Every 24 hours 05/08/21 1135 05/09/21 1448   05/08/21 0800  ceFEPIme (MAXIPIME) 2 g in sodium chloride 0.9 % 100 mL IVPB  Status:  Discontinued        2 g 200 mL/hr over 30 Minutes Intravenous Every 12 hours 05/07/21 1929 05/13/21 0952   05/07/21 2200  ceFEPIme (MAXIPIME) 2 g in sodium chloride 0.9 % 100 mL IVPB  Status:  Discontinued        2 g 200 mL/hr over 30 Minutes Intravenous Every 12 hours 05/07/21 1927 05/07/21 1929   05/07/21 1930  vancomycin (VANCOREADY) IVPB 500 mg/100 mL        500 mg 100 mL/hr over 60 Minutes Intravenous  Once 05/07/21 1927 05/07/21 2144   05/07/21 1927  vancomycin variable dose per unstable renal function (pharmacist dosing)  Status:  Discontinued         Does not apply See admin instructions 05/07/21 1927 05/09/21 1448   05/07/21 1600  vancomycin (VANCOCIN) IVPB 1000 mg/200 mL premix        1,000 mg 200 mL/hr over  60 Minutes Intravenous  Once 05/07/21 1552 05/07/21 1801   05/07/21 1600  ceFEPIme (MAXIPIME) 2 g in sodium chloride 0.9 % 100 mL IVPB        2 g 200 mL/hr over 30 Minutes Intravenous  Once 05/07/21 1552 05/07/21 1700        I have personally reviewed the following labs and images: CBC: Recent Labs  Lab 05/16/21 0603 05/17/21 0548 05/18/21 0620 05/19/21 0457 05/20/21 0500  WBC 17.5* 14.9* 15.2* 12.4* 11.8*  HGB 11.0* 10.2* 10.2* 9.6* 9.8*  HCT 33.6* 31.4* 31.5* 30.3* 31.5*  MCV 96.3 94.0 95.2 94.7 96.3  PLT 196 186 184 175 193   BMP &GFR Recent Labs  Lab 05/14/21 0706 05/15/21 0445 05/16/21 0603 05/17/21 0548 05/18/21 0620 05/19/21 0457 05/20/21 0500  NA 143 140 139 135 137 136 139  K 4.0 4.1 3.8 3.9 4.6 4.6 4.5  CL 104 102 99 94* 97* 96* 98  CO2 32 34* 34* 35* 33* 32 35*  GLUCOSE 96 109* 129* 154* 105* 95 104*  BUN 33* 33* 41* 46* 40* 41* 33*  CREATININE 1.13 1.15 1.41* 1.40* 1.16 1.16 1.09  CALCIUM 9.0 8.5* 8.7* 8.5* 8.8* 8.9 9.0  MG 2.1 2.1 1.9 2.0  --   --   --   PHOS  --  3.6 4.5 4.1  --   --   --    Estimated Creatinine Clearance: 56.9 mL/min (by C-G formula based on SCr of 1.09 mg/dL). Liver & Pancreas: No results for input(s): AST, ALT, ALKPHOS, BILITOT, PROT, ALBUMIN in the last 168 hours.  No results for input(s): LIPASE, AMYLASE in the last 168 hours. No results for input(s): AMMONIA in the last 168 hours. Diabetic: No results for input(s): HGBA1C in the last 72 hours. No results for input(s): GLUCAP in the last 168 hours.  Cardiac Enzymes: No results for input(s): CKTOTAL, CKMB, CKMBINDEX, TROPONINI in the last 168 hours. No results for input(s): PROBNP in the last 8760 hours. Coagulation Profile: No results for input(s): INR, PROTIME in the last 168 hours.  Thyroid Function Tests: No results for input(s): TSH, T4TOTAL, FREET4, T3FREE, THYROIDAB in the last 72 hours. Lipid Profile: No results for input(s): CHOL, HDL, LDLCALC, TRIG, CHOLHDL,  LDLDIRECT in the last 72 hours.  Anemia Panel: No results for input(s): VITAMINB12, FOLATE, FERRITIN, TIBC, IRON, RETICCTPCT in the last 72 hours.  Urine analysis:    Component Value Date/Time   COLORURINE YELLOW (A) 05/07/2021 2044   APPEARANCEUR CLEAR (A) 05/07/2021 2044   LABSPEC 1.021 05/07/2021 2044   PHURINE 6.0 05/07/2021 2044   GLUCOSEU NEGATIVE 05/07/2021 2044   HGBUR NEGATIVE 05/07/2021 2044   BILIRUBINUR NEGATIVE 05/07/2021 2044   [place] 05/07/2021 2044   PROTEINUR NEGATIVE 05/07/2021 2044   NITRITE NEGATIVE 05/07/2021 2044   LEUKOCYTESUR NEGATIVE 05/07/2021 2044   Sepsis Labs: Invalid input(s): PROCALCITONIN, Com[place]  Microbiology: No results found for this or any previous visit (from the past 240 hour(s)).   Radiology Studies: No results found.   Lorella Nimrod, MD [facility]  If 7PM-7AM, please contact night-coverage [email or web address] 05/20/2021, 5:47 PM

## 2021-05-26 NOTE — Plan of Care (Signed)
Problem: Education: Goal: Knowledge of General Education information will improve Description: Including pain rating scale, medication(s)/side effects and non-pharmacologic comfort measures 05/26/2021 1437 by Cristela Blue, RN Outcome: Progressing 05/26/2021 1437 by Cristela Blue, RN Outcome: Progressing   Problem: Health Behavior/Discharge Planning: Goal: Ability to manage health-related needs will improve 05/26/2021 1437 by Cristela Blue, RN Outcome: Progressing 05/26/2021 1437 by Cristela Blue, RN Outcome: Progressing   Problem: Clinical Measurements: Goal: Ability to maintain clinical measurements within normal limits will improve 05/26/2021 1437 by Cristela Blue, RN Outcome: Progressing 05/26/2021 1437 by Cristela Blue, RN Outcome: Progressing Goal: Will remain free from infection 05/26/2021 1437 by Cristela Blue, RN Outcome: Progressing 05/26/2021 1437 by Cristela Blue, RN Outcome: Progressing Goal: Diagnostic test results will improve 05/26/2021 1437 by Cristela Blue, RN Outcome: Progressing 05/26/2021 1437 by Cristela Blue, RN Outcome: Progressing Goal: Respiratory complications will improve 05/26/2021 1437 by Cristela Blue, RN Outcome: Progressing 05/26/2021 1437 by Cristela Blue, RN Outcome: Progressing Goal: Cardiovascular complication will be avoided 05/26/2021 1437 by Cristela Blue, RN Outcome: Progressing 05/26/2021 1437 by Cristela Blue, RN Outcome: Progressing   Problem: Activity: Goal: Risk for activity intolerance will decrease 05/26/2021 1437 by Cristela Blue, RN Outcome: Progressing 05/26/2021 1437 by Cristela Blue, RN Outcome: Progressing   Problem: Nutrition: Goal: Adequate nutrition will be maintained 05/26/2021 1437 by Cristela Blue, RN Outcome: Progressing 05/26/2021 1437 by Cristela Blue, RN Outcome: Progressing   Problem: Coping: Goal: Level of anxiety will decrease 05/26/2021 1437 by Cristela Blue, RN Outcome:  Progressing 05/26/2021 1437 by Cristela Blue, RN Outcome: Progressing   Problem: Elimination: Goal: Will not experience complications related to bowel motility 05/26/2021 1437 by Cristela Blue, RN Outcome: Progressing 05/26/2021 1437 by Cristela Blue, RN Outcome: Progressing Goal: Will not experience complications related to urinary retention 05/26/2021 1437 by Cristela Blue, RN Outcome: Progressing 05/26/2021 1437 by Cristela Blue, RN Outcome: Progressing   Problem: Pain Managment: Goal: General experience of comfort will improve 05/26/2021 1437 by Cristela Blue, RN Outcome: Progressing 05/26/2021 1437 by Cristela Blue, RN Outcome: Progressing   Problem: Safety: Goal: Ability to remain free from injury will improve 05/26/2021 1437 by Cristela Blue, RN Outcome: Progressing 05/26/2021 1437 by Cristela Blue, RN Outcome: Progressing   Problem: Skin Integrity: Goal: Risk for impaired skin integrity will decrease 05/26/2021 1437 by Cristela Blue, RN Outcome: Progressing 05/26/2021 1437 by Cristela Blue, RN Outcome: Progressing   Problem: Activity: Goal: Ability to tolerate increased activity will improve 05/26/2021 1437 by Cristela Blue, RN Outcome: Progressing 05/26/2021 1437 by Cristela Blue, RN Outcome: Progressing   Problem: Clinical Measurements: Goal: Ability to maintain a body temperature in the normal range will improve 05/26/2021 1437 by Cristela Blue, RN Outcome: Progressing 05/26/2021 1437 by Cristela Blue, RN Outcome: Progressing   Problem: Respiratory: Goal: Ability to maintain adequate ventilation will improve 05/26/2021 1437 by Cristela Blue, RN Outcome: Progressing 05/26/2021 1437 by Cristela Blue, RN Outcome: Progressing Goal: Ability to maintain a clear airway will improve 05/26/2021 1437 by Cristela Blue, RN Outcome: Progressing 05/26/2021 1437 by Cristela Blue, RN Outcome: Progressing   Problem: Education: Goal: Understanding of cardiac  disease, CV risk reduction, and recovery process will improve 05/26/2021 1437 by Cristela Blue, RN Outcome: Progressing 05/26/2021 1437 by Cristela Blue, RN Outcome: Progressing Goal: Individualized Educational Video(s) 05/26/2021 1437 by Cristela Blue, RN Outcome: Progressing 05/26/2021 1437 by Cristela Blue, RN Outcome: Progressing   Problem: Activity: Goal: Ability to tolerate increased activity will improve 05/26/2021 1437 by Cristela Blue, RN Outcome: Progressing 05/26/2021 1437 by Cristela Blue,  RN Outcome: Progressing   Problem: Cardiac: Goal: Ability to achieve and maintain adequate cardiovascular perfusion will improve 05/26/2021 1437 by Cristela Blue, RN Outcome: Progressing 05/26/2021 1437 by Cristela Blue, RN Outcome: Progressing   Problem: Health Behavior/Discharge Planning: Goal: Ability to safely manage health-related needs after discharge will improve 05/26/2021 1437 by Cristela Blue, RN Outcome: Progressing 05/26/2021 1437 by Cristela Blue, RN Outcome: Progressing

## 2021-05-27 ENCOUNTER — Telehealth: Payer: Self-pay | Admitting: *Deleted

## 2021-05-27 DIAGNOSIS — J9601 Acute respiratory failure with hypoxia: Secondary | ICD-10-CM | POA: Diagnosis not present

## 2021-05-27 DIAGNOSIS — J849 Interstitial pulmonary disease, unspecified: Secondary | ICD-10-CM | POA: Diagnosis not present

## 2021-05-27 DIAGNOSIS — T50905A Adverse effect of unspecified drugs, medicaments and biological substances, initial encounter: Secondary | ICD-10-CM | POA: Diagnosis not present

## 2021-05-27 DIAGNOSIS — C3431 Malignant neoplasm of lower lobe, right bronchus or lung: Secondary | ICD-10-CM | POA: Diagnosis not present

## 2021-05-27 DIAGNOSIS — J189 Pneumonia, unspecified organism: Secondary | ICD-10-CM | POA: Diagnosis not present

## 2021-05-27 NOTE — Care Management Important Message (Signed)
Important Message  Patient Details  Name: Jimmy West MRN: [id number] Date of Birth: [birth year]   Medicare Important Message Given:  Yes     Dannette Barbara 05/27/2021, 4:15 PM

## 2021-05-27 NOTE — Progress Notes (Signed)
PROGRESS NOTE  Jimmy West JKK:[id number] DOB: 1951-05-14   PCP: Pcp, No  Patient is from: Home.  Uses rolling walker at baseline.  DOA: 05/07/2021 LOS: 83  Chief complaints:  Chief Complaint  Patient presents with   Shortness of Breath     Brief Narrative / Interim history: 70 year old M with PMH of stage IV RLL lung cancer, cancer pain, CAD/stent in 2005, chronic respiratory failure with hypoxia on 2 L, anemia of chronic disease, anxiety, depression and debility presenting with generalized weakness, shortness of breath and dry cough, and admitted for acute on chronic hypoxic respiratory failure in the setting of LLL pneumonia and non-STEMI.  Started on broad-spectrum antibiotics, steroid and IV heparin.  Cardiology consulted recommended medical management and [facility].  CTA chest negative for PE.  10/25 patient became hypoxic and required directed oxygen, started high flow nasal cannula, FiO2 54%, inpatient setting around 90s  11/1: Patient continued to require heated high flow at 40L and 45% FiO2, clinically seems stable.  Pulmonology is following.  No obvious infection.  Completed the course of antibiotics.  Currently procalcitonin negative.  Per pulmonary note differential can be Pemetrexed/Pembrolizumab toxicity (grade 3), lymphangitic spread of CA (not entirely ruled out).  Will require long taper of steroid. They are also recommending trial of infliximab-patient received first dose on 05/22/2021  11/3: Patient with significant improvement in oxygen requirement after getting infliximab yesterday, now saturating in mid 90s on 15L of oxygen with high flow.  11/4: Oxygenation continues to improve, still not at baseline.  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  Oncology saw him today and discontinued the immune therapy and they will just do surveillance at this time. Patient will need second dose of infliximab in 2 to 3 weeks after having a  repeat CT chest.  Subjective: Oxygenation continued to improve at rest, currently on 5 L of oxygen but requiring up to 7L with minimum exertion like using bedside commode.  Objective: Vitals:   05/20/21 1200 05/20/21 1317 05/20/21 1333 05/20/21 1551  BP: 116/65   91/71  Pulse: 98   87  Resp: 18   17  Temp: 98.1 F (36.7 C)   98.3 F (36.8 C)  TempSrc:      SpO2: 92% 90% 91% 96%  Weight:      Height:        Intake/Output Summary (Last 24 hours) at 05/20/2021 1747 Last data filed at 05/20/2021 1047 Gross per 24 hour  Intake 480 ml  Output 2700 ml  Net -2220 ml   Filed Weights   05/07/21 1439 05/10/21 0901 05/10/21 1537  Weight: 58.4 kg 63.2 kg 64 kg    Examination:  General.  Malnourished gentleman, in no acute distress. Pulmonary.  Lungs clear bilaterally, normal respiratory effort. CV.  Regular rate and rhythm, no JVD, rub or murmur. Abdomen.  Soft, nontender, nondistended, BS positive. CNS.  Alert and oriented .  No focal neurologic deficit. Extremities.  No edema, no cyanosis, pulses intact and symmetrical. Psychiatry.  Judgment and insight appears normal.    Procedures:  None  Microbiology summarized: COVID-19 and influenza PCR nonreactive. MRSA PCR screen negative. Blood cultures NGTD.  Assessment & Plan: Acute on chronic respiratory failure with hypoxia due to LLL pneumonia with superimposed stage IV RLL lung cancer-on 2 L at baseline.  Oxygen requirement improved to 15 L with high flow after getting infliximab yesterday. .  CTA chest negative for PE but concern for LLL pneumonia with RLL scarring  from known lung cancer.   Blood cultures negative. On cefepime since 10/18 making today day 7. S/p vancomycin. Oxygen requirement seems stable at this time - s/p Cefepime completed 7 days. S/p azithromycin 500 x 3 doses  - check CXR to eval for parapneumonic effusion which would require extended course abx - stop solumedrol, start oral prednisone, plan to  wean -Wean oxygen as able. -IS/OOB/PT/OT - palliative consulted - Dr. B of oncology aware of the patient -Pulmonology was also consulted and there is a concern of lymphangitis spread versus drug toxicity. -Pulmonary is recommending prolonged taper of steroid. -Received first dose of infliximab and repeat in 2 weeks as he did showed improvement. -Per pulmonology they will repeat infliximab in 2 to 3 weeks and he will need a repeat CT chest before getting the second dose. -Oncology stopped immune therapy due to development of pneumonitis, stating we will just do surveillance at this time.  Severe sepsis due to pneumonia:tachycardia, tachypnea, leukocytosis, AKI with pneumonia POA. Sepsis physiology resolved -Management as above.   NSTEMI/history of CAD/stent in 2005: Troponin 3153 -> 3506 -> 2927 -> 2894.  EKG with T wave changes in lateral leads but no dynamic changes.  TTE with LVEF of 55 to 60%, G1-DD and no RWMA chest pain-free.  -Completed 48 hours of IV heparin and transition back to home Eliquis. -Medical management due to poor candidacy for heart cath in the setting of respiratory failure -Cardiology recommends outpatient follow-up when stable from respiratory standpoint.  Chronic diastolic CHF: TTE as above.  Appears euvolemic on exam.  Does not seem to be on diuretics at home.  About 2100 ml UOP/24 hours. -Monitor fluid status   AKI/azotemia on CKD-3A: Improved.  Does not seem to be on nephrotoxic meds.  Could be hemodynamically mediated Recent Labs    05/11/21 0555 05/12/21 0530 05/13/21 0515 05/14/21 0706 05/15/21 0445 05/16/21 0603 05/17/21 0548 05/18/21 0620 05/19/21 0457 05/20/21 0500  BUN 42* 48* 41* 33* 33* 41* 46* 40* 41* 33*  CREATININE 1.39* 1.32* 1.22 1.13 1.15 1.41* 1.40* 1.16 1.16 1.09  -Continue monitoring -Avoid nephrotoxic meds.  Hypotension: Resolved.   Stage IV lung cancer s/p radiation and chemotherapy previously.   -Outpatient follow-up -  oncology following    Anemia of chronic disease: H&H stable after 1 unit Recent Labs    05/09/21 0701 05/10/21 0517 05/11/21 0555 05/12/21 0530 05/15/21 0445 05/16/21 0603 05/17/21 0548 05/18/21 0620 05/19/21 0457 05/20/21 0500  HGB 8.1* 7.6* 9.3* 9.8* 10.3* 11.0* 10.2* 10.2* 9.6* 9.8*  -Continue monitoring   Chronic pain syndrome: -Continue oxycodone and bowel regimen as needed.   Depression and Anxiety: Stable -Continue with Fluoxetine 40 g p.o. daily as well as Olanzapine 10 mg p.o. nightly   Hx of PE: CTA chest negative for PE. -Continue home Eliquis  Physical deconditioning-uses rolling walker at baseline. -Therapy recommends SNF  Leukocytosis/bandemia-likely from steroid.  Goal of care counseling-patient with comorbidity as above.  Very frail and deconditioned.  Poor long-term prognosis.  Appropriately DNR/DNI.  A candidate for hospice -Palliative medicine consulted.  Body mass index is 22.77 kg/m.    DVT prophylaxis:  apixaban (ELIQUIS) tablet 5 mg  Code Status: DNR/DNI Family Communication: Discussed with POA on phone.   Level of care: Progressive Cardiac Status is: Inpatient  Remains inpatient appropriate because:  Oxygenation improved to 5L of oxygen at rest, still significant dyspnea and hypoxia requiring up to 7 L of oxygen with minor exertion.  Consultants:  Cardiology Pulmonology Palliative medicine  Oncology  Sch Meds:  Scheduled Meds:  apixaban  5 mg Oral BID   atorvastatin  40 mg Oral Daily   budesonide (PULMICORT) nebulizer solution  0.5 mg Nebulization BID   Chlorhexidine Gluconate Cloth  6 each Topical Daily   feeding supplement  237 mL Oral TID BM   FLUoxetine  40 mg Oral Daily   fluticasone furoate-vilanterol  1 puff Inhalation Daily   guaiFENesin  1,200 mg Oral BID   ipratropium-albuterol  3 mL Nebulization Q6H   OLANZapine  10 mg Oral QHS   predniSONE  40 mg Oral Q breakfast   sulfamethoxazole-trimethoprim  1 tablet Oral Once  per day on Mon Wed Fri   Continuous Infusions:  sodium chloride 10 mL/hr (05/13/21 1620)   PRN Meds:.sodium chloride, acetaminophen, ALPRAZolam, alum & mag hydroxide-simeth, docusate sodium, guaiFENesin-dextromethorphan, ondansetron (ZOFRAN) IV, ondansetron, oxyCODONE, polyethylene glycol, sodium chloride  Antimicrobials: Anti-infectives (From admission, onward)    Start     Dose/Rate Route Frequency Ordered Stop   05/17/21 0900  sulfamethoxazole-trimethoprim (BACTRIM DS) 800-160 MG per tablet 1 tablet        1 tablet Oral Once per day on Mon Wed Fri 05/16/21 0935     05/13/21 1030  ceFEPIme (MAXIPIME) 2 g in sodium chloride 0.9 % 100 mL IVPB        2 g 200 mL/hr over 30 Minutes Intravenous Every 12 hours 05/13/21 0952 05/13/21 1959   05/11/21 1630  azithromycin (ZITHROMAX) 500 mg in sodium chloride 0.9 % 250 mL IVPB        500 mg 250 mL/hr over 60 Minutes Intravenous Every 24 hours 05/11/21 1537 05/13/21 1721   05/08/21 1800  vancomycin (VANCOREADY) IVPB 750 mg/150 mL  Status:  Discontinued        750 mg 150 mL/hr over 60 Minutes Intravenous Every 24 hours 05/08/21 1135 05/09/21 1448   05/08/21 0800  ceFEPIme (MAXIPIME) 2 g in sodium chloride 0.9 % 100 mL IVPB  Status:  Discontinued        2 g 200 mL/hr over 30 Minutes Intravenous Every 12 hours 05/07/21 1929 05/13/21 0952   05/07/21 2200  ceFEPIme (MAXIPIME) 2 g in sodium chloride 0.9 % 100 mL IVPB  Status:  Discontinued        2 g 200 mL/hr over 30 Minutes Intravenous Every 12 hours 05/07/21 1927 05/07/21 1929   05/07/21 1930  vancomycin (VANCOREADY) IVPB 500 mg/100 mL        500 mg 100 mL/hr over 60 Minutes Intravenous  Once 05/07/21 1927 05/07/21 2144   05/07/21 1927  vancomycin variable dose per unstable renal function (pharmacist dosing)  Status:  Discontinued         Does not apply See admin instructions 05/07/21 1927 05/09/21 1448   05/07/21 1600  vancomycin (VANCOCIN) IVPB 1000 mg/200 mL premix        1,000 mg 200 mL/hr  over 60 Minutes Intravenous  Once 05/07/21 1552 05/07/21 1801   05/07/21 1600  ceFEPIme (MAXIPIME) 2 g in sodium chloride 0.9 % 100 mL IVPB        2 g 200 mL/hr over 30 Minutes Intravenous  Once 05/07/21 1552 05/07/21 1700        I have personally reviewed the following labs and images: CBC: Recent Labs  Lab 05/16/21 0603 05/17/21 0548 05/18/21 0620 05/19/21 0457 05/20/21 0500  WBC 17.5* 14.9* 15.2* 12.4* 11.8*  HGB 11.0* 10.2* 10.2* 9.6* 9.8*  HCT 33.6* 31.4* 31.5* 30.3* 31.5*  MCV 96.3 94.0 95.2 94.7 96.3  PLT 196 186 184 175 193   BMP &GFR Recent Labs  Lab 05/14/21 0706 05/15/21 0445 05/16/21 0603 05/17/21 0548 05/18/21 0620 05/19/21 0457 05/20/21 0500  NA 143 140 139 135 137 136 139  K 4.0 4.1 3.8 3.9 4.6 4.6 4.5  CL 104 102 99 94* 97* 96* 98  CO2 32 34* 34* 35* 33* 32 35*  GLUCOSE 96 109* 129* 154* 105* 95 104*  BUN 33* 33* 41* 46* 40* 41* 33*  CREATININE 1.13 1.15 1.41* 1.40* 1.16 1.16 1.09  CALCIUM 9.0 8.5* 8.7* 8.5* 8.8* 8.9 9.0  MG 2.1 2.1 1.9 2.0  --   --   --   PHOS  --  3.6 4.5 4.1  --   --   --    Estimated Creatinine Clearance: 56.9 mL/min (by C-G formula based on SCr of 1.09 mg/dL). Liver & Pancreas: No results for input(s): AST, ALT, ALKPHOS, BILITOT, PROT, ALBUMIN in the last 168 hours.  No results for input(s): LIPASE, AMYLASE in the last 168 hours. No results for input(s): AMMONIA in the last 168 hours. Diabetic: No results for input(s): HGBA1C in the last 72 hours. No results for input(s): GLUCAP in the last 168 hours.  Cardiac Enzymes: No results for input(s): CKTOTAL, CKMB, CKMBINDEX, TROPONINI in the last 168 hours. No results for input(s): PROBNP in the last 8760 hours. Coagulation Profile: No results for input(s): INR, PROTIME in the last 168 hours.  Thyroid Function Tests: No results for input(s): TSH, T4TOTAL, FREET4, T3FREE, THYROIDAB in the last 72 hours. Lipid Profile: No results for input(s): CHOL, HDL, LDLCALC, TRIG,  CHOLHDL, LDLDIRECT in the last 72 hours.  Anemia Panel: No results for input(s): VITAMINB12, FOLATE, FERRITIN, TIBC, IRON, RETICCTPCT in the last 72 hours.  Urine analysis:    Component Value Date/Time   COLORURINE YELLOW (A) 05/07/2021 2044   APPEARANCEUR CLEAR (A) 05/07/2021 2044   LABSPEC 1.021 05/07/2021 2044   PHURINE 6.0 05/07/2021 2044   GLUCOSEU NEGATIVE 05/07/2021 2044   HGBUR NEGATIVE 05/07/2021 2044   BILIRUBINUR NEGATIVE 05/07/2021 2044   [place] 05/07/2021 2044   PROTEINUR NEGATIVE 05/07/2021 2044   NITRITE NEGATIVE 05/07/2021 2044   LEUKOCYTESUR NEGATIVE 05/07/2021 2044   Sepsis Labs: Invalid input(s): PROCALCITONIN, [place]  Microbiology: No results found for this or any previous visit (from the past 240 hour(s)).   Radiology Studies: No results found.   Lorella Nimrod, MD [facility]  If 7PM-7AM, please contact night-coverage [email or web address] 05/20/2021, 5:47 PM

## 2021-05-27 NOTE — Progress Notes (Signed)
PT Cancellation Note  Patient Details Name: Jimmy West MRN: [id number] DOB: 1951/05/16   Cancelled Treatment:    Reason Eval/Treat Not Completed: Other (comment).  Pt resting in bed upon PT arrival.  Pt declined PT session d/t feeling too fatigued to do any more activity at this time (pt reports he did work with OT earlier but wasn't able to tolerate a lot).  Will re-attempt PT session at a later date/time.  Leitha Bleak, PT 05/27/21, 4:37 PM

## 2021-05-27 NOTE — Progress Notes (Signed)
NAME:  Jimmy West, MRN:  [id number], DOB:  14-Oct-1950, LOS: 89 ADMISSION DATE:  05/07/2021, INITIAL CONSULTATION DATE: 05/08/2021 REFERRING MD: Dr. Billie Ruddy ; follow-up requested by Dr. Dwyane Dee CHIEF COMPLAINT: Shortness of breath, bilateral infiltrates.  Follow-up requested from initial consultation on 08 May 2021.  Persistent respiratory failure with hypoxia.  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 Patient confirms DNR/DNI code status today and was followed by palliative care team during previous admission. He has one living brother who lives in [place] and has not spoken to him in years. His preferred contact person is Jimmy West (information in patient records). Patient lives alone and does not have assistance at home.    Patient presented to the ER via EMS last night 10/18 with chief complaint of shortness of breath. Patient reports that he has not felt well since his hospital discharge with profound weakness, too weak to eat, and worsening shortness of breath. He reports that he started wearing his home oxygen all of the time due to not feeling well which helped his pulmonary symptoms some. He states he was not using his prescribed inhaler because the pump was malfunctioning on the handheld device. He also describes wheezing and dry, nonproductive cough which have worsened in the last several days. He denies recent fever, chills, sweating, nausea, vomiting, swelling. He reports small volume loose stools occasionally which is not his  routine bowel movements.    On arrival to the ER, patient with noted oxygen saturations in the 80s on room air by EMS with blood pressure systolic in the 36O after a 200cc fluid bolus. Vital signs on arrival pulse 97, BP 103/74, RR 36, oxygen saturations 91% on 6L, afebrile. Patient noted to easily desaturate with activity, speaking, and any interruption to his oxygen flow such as when he was transitioned to 8L high flow nasal cannula with humidifier. Labs noted WBC 16.6, hgb 9.9, Creatinine 1.71, BNP 1105, troponin 3153 and 3506 on repeat. COVID and flu screening negative. Imaging: CXR findings of new left lower lobe airspace process suspicious for pneumonia with extensive right lower lobe density consistent with radiation change. D-dimer was elevated, and CTA of the chest was done which was negative for PE. Patient had sepsis bundle initiated and received 2 liters LR fluid resuscitation, empiric vancomycin and cefepime in the ER. He was started on a heparin continuous infusion for NSTEMI suspected demand ischemia on history of CAD with remote PCI.    PCCM was consulted to assess patient. He is currently being seen in the ER pending bed placement inpatient, tolerating 8 liters highflow oxygen cannula at oxygen saturations 92-93%; however, he has noted increased work of breathing with accessory muscles of breathing use, shortness of breath worsening with conversation with short shallow breaths between words. Patient is alert, oriented to self, location, situation and time. He denies any other recent events other than what is described above. He has never used a BiPAP or other NIPPV support device before but is agreeable to trial on BiPAP for a short duration if his respiratory condition worsens. He confirms DNI status as part of  DNR and that he would not want intubation to support his breathing.   Hospital course from 19 October - 26 Oct : Patient has presented with acute on chronic respiratory failure with  hypoxia with CT imaging with extensive differential diagnosis.  He has been treated as "worsening pneumonia" with increasing FiO2 requirements in the process.  He is currently on steroids.  Had initial improvement on symptoms however, now has seemed to plateau.  We are asked to render opinion as to how long steroids will be necessary.  He is currently requiring high flow O2 at 60%.  Pertinent  Medical History   Stage IV adenocarcinoma of the lung Myocardial infarction in the past Remote history of PE on Eliquis CAD hx PCI 2005 Chronic anemia secondary to cancer treatment Port-a-cath right chest Recently discharged 04/28/21 after treatment of aspiration pneumonia  Significant Hospital Events: Including procedures, antibiotic start and stop dates in addition to other pertinent events   10/18: Brought by EMS to ER with cc: sob, hypoxic sats 80% on RA and hypotensive. CXR concerning for LUL pna, sepsis with NSTEMI demand ischemia on workup. Started on cefepime, vancomycin, heparin continuous infusion and assigned to hospitalist service pending bed placement.  10/19: Evaluated by PCCM significant pulm history with recurrent pneumonia ? Aspiration versus HCAP, sepsis, NSTEMI, hypotension. Stable sats on 8lpm highflow oxygen, with increased WOB and accessory muscle use.  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  On bubble high flow at 12 L/min 11/07: Continues to improve slowly.  O2 requirements down to 5 L/min.  Still with desaturations with activity  Interim  History / Subjective:  He continues to feel that he is getting better.  Oxygen requirements have decreased now to 5 L/min via nasal cannula.  Continues to clear secretions well.  No conversational dyspnea.  States he feels "stronger".  Objective   Blood pressure 106/67, pulse 95, temperature 97.6 F (36.4 C), resp. rate 20, height 5' 6"  (1.676 m), weight 64 kg, SpO2 92 %.    SpO2: 92 % O2 Flow Rate (L/min): 5 L/min FiO2 (%): 45 %  Intake/Output Summary (Last 24 hours) at 05/27/2021 1537 Last data filed at 05/27/2021 1042 Gross per 24 hour  Intake 1080 ml  Output 1600 ml  Net -520 ml    Filed Weights   05/07/21 1439 05/10/21 0901 05/10/21 1537  Weight: 58.4 kg 63.2 kg 64 kg    Examination: GENERAL: Chronically ill-appearing gentleman, sitting up in bed, on nasal cannula O2, comfortable, no conversational dyspnea noted today.   HEAD: Normocephalic, atraumatic.  EYES: Pupils equal, round, reactive to light.  No scleral icterus.  MOUTH: Oral mucosa moist. NECK: Supple. No thyromegaly. Trachea midline. No JVD.  No adenopathy. PULMONARY: Good air entry bilaterally.  Coarse throughout, no other adventitious sounds. CARDIOVASCULAR: S1 and S2. Regular rate and rhythm.  No rubs, murmurs or gallops heard. ABDOMEN: Scaphoid, otherwise benign. MUSCULOSKELETAL: No joint deformity, no clubbing, no edema.  Diffuse muscle wasting. NEUROLOGIC: No overt focal deficit, gait not tested.   SKIN: Intact,warm,dry. PSYCH: Conversant.  Mood and  behavior normal.  Resolved Hospital Problem list   N/A  Assessment & Plan:  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 DDX: Steroid refractory ICI toxicity (grade 3), lymphangitic spread of CA (not entirely ruled out) - Titrate oxygen supplementation to maintain O2 sats 88-92% - Has been titrated to  bubble high flow 12 L - Continue Brovana with Pulmicort twice a day via neb - Continue Yupelri once daily via neb - Continue prednisone at 30 mg daily (started 11/2), taper as O2 requirements decrease - PJP prophylaxis Bactrim DS Monday Wednesday Friday - Will need taper of steroids over 4 to 6-week period of time - Procalcitonin's have been negative, KL6>> 1,866 (reference 0-500) - Fungitell negative - Immunoglobulins show modest decrease in IgG=458, consider IVIG if no further improvement - Inflammatory markers are elevated (ESR/CRP), LDH 239 - Repeated CT chest 10/26, persistent interstitial and airspace process with no significant change - Significant clinical improvement over last few days - Received infliximab 5 mg/kg x 1, 11/2, repeat in 2- 3 weeks x1  - Repeat CT chest noncontrasted in 2 to 3 weeks prior to infliximab second dose  Pneumonia ruled out Procalcitonin is normal Persistent findings on CT with differential as above -No need for antibiotics at present except for PJP prophylaxis -Aspiration precautions -Leukocytosis likely due to steroids, WBC 10.5 today   ACUTE KIDNEY INJURY improving - Avoid nephrotoxic agents as able - Follow urine output, BMP - Monitor closely on PJP prophylaxis - Ensure adequate renal perfusion, optimize oxygenation  Anemia, chronic, secondary to cancer treatment - stable - Monitor routine CBC   Elevated d-dimer  History PE on Eliquis - CTA Chest 10/18 negative for PE - High risk for PE recurrence due to underlying malignancy - Continue Eliquis    Radiologic studies  Chest x-ray today:  Persistent infiltrates, cardiomegaly, slight increased aeration.  Labs   CBC: Recent Labs  Lab 05/21/21 0512 05/22/21 0625 05/25/21 0620  WBC 10.9* 10.5 10.4  HGB 10.0* 9.6* 9.8*  HCT 31.7* 30.0* 30.7*  MCV 96.4 95.2 97.2  PLT 191 178 161     Basic Metabolic Panel: Recent Labs  Lab 05/21/21 0512 05/22/21 0625  NA 137 141  K 4.5 4.4   CL 96* 101  CO2 34* 34*  GLUCOSE 91 88  BUN 33* 33*  CREATININE 1.07 0.94  CALCIUM 9.2 9.1  MG 1.8  --   PHOS 4.0  --     GFR: Estimated Creatinine Clearance: 66 mL/min (by C-G formula based on SCr of 0.94 mg/dL). Recent Labs  Lab 05/21/21 0512 05/22/21 0625 05/25/21 0620  WBC 10.9* 10.5 10.4     Liver Function Tests: No results for input(s): AST, ALT, ALKPHOS, BILITOT, PROT, ALBUMIN in the last 168 hours.  No results for input(s): LIPASE, AMYLASE in the last 168 hours. No results for input(s): AMMONIA in the last 168 hours.  ABG    Component Value Date/Time   PHART 7.51 (H) 05/14/2021 1459   PCO2ART 38 05/14/2021 1459   PO2ART 51 (L) 05/14/2021 1459   HCO3 30.3 (H) 05/14/2021 1459   O2SAT 89.4 05/14/2021 1459     CBG: Recent Labs  Lab 05/21/21 1606  GLUCAP 159*     Review of Systems:   A 10 point review of systems was performed and it is as noted above otherwise negative.  Allergies No Known Allergies  Scheduled Meds:  apixaban  5 mg Oral BID   arformoterol  15 mcg Nebulization BID   atorvastatin  40 mg Oral Daily   budesonide (PULMICORT) nebulizer solution  0.5 mg Nebulization BID   Chlorhexidine Gluconate Cloth  6 each Topical Daily   feeding supplement  237 mL Oral TID BM   FLUoxetine  40 mg Oral Daily   guaiFENesin  1,200 mg Oral BID   OLANZapine  10 mg Oral QHS   predniSONE  30 mg Oral Q breakfast   revefenacin  175 mcg Nebulization Daily   sulfamethoxazole-trimethoprim  1 tablet Oral Once per day on Mon Wed Fri   Continuous Infusions:  sodium chloride 10 mL/hr (05/13/21 1620)   PRN Meds:.sodium chloride, acetaminophen, ALPRAZolam, alum & mag hydroxide-simeth, docusate sodium, ondansetron (ZOFRAN) IV, ondansetron, oxyCODONE, polyethylene glycol, sodium chloride Received infliximab x1 today  Level 2 follow-up    Discussion: Patient appears to have more of an interstitial process that is failing to improve this is either steroid  refractory immune checkpoint inhibitor (ICI) induced pneumonitis, grade 3, or lymphangitic spread of tumor.  At this point ICI induced pneumonitis more likely.  KL- 6 has been shown useful in determining ILD from other modalities, this is highly elevated which is more suggestive drug-induced ILD in this situation. Continue prednisone at decreased dose of 30 mg daily, started 11/2.  He will need a 4 to 6-week slow taper.  Continue to taper off as O2 requirements decrease.  Continue PJP prophylaxis (MWF).  Challenged with infliximab 5 mg/kg x 1 11/2.  Showing improvement since then.  Continue nebulization treatments and pulmonary hygiene.  He does have modest decrease of IgG however, may be due to acute illness.  However, if his clinical picture fails to improve significantly after infliximab may consider IVIG infusion.   Renold Don, MD Advanced Bronchoscopy PCC[facility]    *This note was dictated using voice recognition software/Dragon.  Despite best efforts to proofread, errors can occur which can change the meaning.  Any change was purely unintentional.

## 2021-05-27 NOTE — Telephone Encounter (Addendum)
Reviewed the patients chart 05/27/21. Patient is still currently admitted.

## 2021-05-27 NOTE — Telephone Encounter (Signed)
Pt's friend, [name], asking if pt can get handicap placard. Stated she can come pick up the signed form once ready.

## 2021-05-27 NOTE — Progress Notes (Signed)
Occupational Therapy Treatment Patient Details Name: Jimmy West MRN: [id number] DOB: May 22, 1951 Today's Date: 05/27/2021   History of present illness 70 y.o. male was brought to hosp to admit 10/18 for hypoxia, weakness, SOB and noted sepsis from PNA, as well as NSTEMI.  Troponins are high from demand ischemia, EF 40-45%, and has recent asp PNA per chart.  PMHx:  R lower lobe stage IV adenocarcinoma of the lung, chronic anemia, anxiety, depression, presence of Port-A-Cath, cad s/p PCI with stent placement in 2005, tobacco abuse quit in 2005   OT comments  Mr. Hollars continues to make progress towards his goal but continues to present with limited endurance and generalized weakness. O2 use down to 5L O2 this afternoon. With bed mobility, O2 sats drop from upper 80s (supine) to low 80s with transfer to EOB. Provided educ re breathing techniques, O2 levels increase within 3 minutes. With short stand, very short distance ambulation, pt desats to mid 70s. Reports improved back pain in sitting as opposed to supine. With deep breathing techniques, O2 sats still remains in low 80s. Increased O2 to 6L, with sats increasing to upper 80s within 2 minutes. Provided educ re: simple exercises pt can do from bed level or sitting. He verbalizes understanding. Will continue to follow POC, anticipate DC to SNF.   Recommendations for follow up therapy are one component of a multi-disciplinary discharge planning process, led by the attending physician.  Recommendations may be updated based on patient status, additional functional criteria and insurance authorization.    Follow Up Recommendations  Skilled nursing-short term rehab (<3 hours/day)    Assistance Recommended at Discharge Intermittent Supervision/Assistance  Equipment Recommendations       Recommendations for Other Services      Precautions / Restrictions Precautions Precautions: Fall Precaution Comments: monitor sats and HR; mod fall  risk Restrictions Weight Bearing Restrictions: No Other Position/Activity Restrictions: Watch SpO2       Mobility Bed Mobility Overal bed mobility: Modified Independent Bed Mobility: Supine to Sit     Supine to sit: Modified independent (Device/Increase time)     General bed mobility comments: Requires greatly increased time, frequent rest breaks, for supine<sit Patient Response: Anxious;Cooperative  Transfers Overall transfer level: Needs assistance Equipment used: 1 person hand held assist   Sit to Stand: Supervision Stand pivot transfers: Supervision         General transfer comment: Increased time, HHA required for steadying     Balance Overall balance assessment: Needs assistance Sitting-balance support: Single extremity supported;Feet supported Sitting balance-Leahy Scale: Fair Sitting balance - Comments: Pt had difficulty maintaining erect sitting posture, 2/2 SOB. Utilizes L lateral lean for UB support Postural control: Left lateral lean Standing balance support: No upper extremity supported Standing balance-Leahy Scale: Good Standing balance comment: Good standing balance, but only able to maintain briefly, requesting to return to sitting, 2/2 SOB                           ADL either performed or assessed with clinical judgement   ADL Overall ADL's : Needs assistance/impaired                                       General ADL Comments: Mod A for ADLs in sitting, 2/2 SOB    Extremity/Trunk Assessment Upper Extremity Assessment Upper Extremity Assessment: Generalized weakness   Lower Extremity  Assessment Lower Extremity Assessment: Generalized weakness   Cervical / Trunk Assessment Cervical / Trunk Assessment: Normal    Vision       Perception     Praxis      Cognition Arousal/Alertness: Awake/alert Behavior During Therapy: WFL for tasks assessed/performed Overall Cognitive Status: Within Functional Limits for  tasks assessed                                            Exercises Other Exercises Other Exercises: Provided educ re: breathing, relaxation techniques; energy conservation strategies. Educ re: strengthening, conditioning, use of therex band. Left pt with band   Shoulder Instructions       General Comments      Pertinent Vitals/ Pain       Pain Assessment: 0-10 Pain Score: 5  Pain Location: back Pain Descriptors / Indicators: Aching Pain Intervention(s): Repositioned;Monitored during session;Utilized relaxation techniques  Home Living                                          Prior Functioning/Environment              Frequency  Min 2X/week        Progress Toward Goals  OT Goals(current goals can now be found in the care plan section)  Progress towards OT goals: Progressing toward goals  Acute Rehab OT Goals OT Goal Formulation: With patient Time For Goal Achievement: 06/06/21 Potential to Achieve Goals: Good  Plan Discharge plan remains appropriate;Frequency remains appropriate    Co-evaluation                 AM-PAC OT "6 Clicks" Daily Activity     Outcome Measure   Help from another person eating meals?: None Help from another person taking care of personal grooming?: A Little Help from another person toileting, which includes using toliet, bedpan, or urinal?: A Little Help from another person bathing (including washing, rinsing, drying)?: A Lot Help from another person to put on and taking off regular upper body clothing?: A Little Help from another person to put on and taking off regular lower body clothing?: A Lot 6 Click Score: 17    End of Session Equipment Utilized During Treatment: Oxygen  OT Visit Diagnosis: Other abnormalities of gait and mobility (R26.89);Muscle weakness (generalized) (M62.81);Unsteadiness on feet (R26.81)   Activity Tolerance Patient tolerated treatment well   Patient Left in  chair;with call bell/phone within reach;with chair alarm set   Nurse Communication          Time: 8466-5993 OT Time Calculation (min): 39 min  Charges: OT General Charges $OT Visit: 1 Visit OT Treatments $Self Care/Home Management : 8-22 mins $Therapeutic Activity: 8-22 mins $Therapeutic Exercise: 8-22 mins  Josiah Lobo, PhD, MS, OTR/L 05/27/21, 2:00 PM

## 2021-05-28 ENCOUNTER — Inpatient Hospital Stay: Payer: Medicare HMO

## 2021-05-28 DIAGNOSIS — T50905A Adverse effect of unspecified drugs, medicaments and biological substances, initial encounter: Secondary | ICD-10-CM | POA: Diagnosis not present

## 2021-05-28 DIAGNOSIS — J9601 Acute respiratory failure with hypoxia: Secondary | ICD-10-CM | POA: Diagnosis not present

## 2021-05-28 DIAGNOSIS — C3431 Malignant neoplasm of lower lobe, right bronchus or lung: Secondary | ICD-10-CM | POA: Diagnosis not present

## 2021-05-28 DIAGNOSIS — J189 Pneumonia, unspecified organism: Secondary | ICD-10-CM | POA: Diagnosis not present

## 2021-05-28 MED ORDER — SODIUM CHLORIDE 0.9% FLUSH
10.0000 mL | Freq: Two times a day (BID) | INTRAVENOUS | Status: DC
  Administered 2021-05-28 – 2021-06-13 (×31): 10 mL

## 2021-05-28 MED ORDER — SODIUM CHLORIDE 0.9% FLUSH
10.0000 mL | INTRAVENOUS | Status: DC | PRN
  Administered 2021-06-13: 10 mL

## 2021-05-28 NOTE — Progress Notes (Signed)
PT Cancellation Note  Patient Details Name: Jimmy West MRN: [id number] DOB: Nov 29, 1950   Cancelled Treatment:    Reason Eval/Treat Not Completed: Other (comment). Patient refused PT at this time due to feeling tired. PT will continue with attempts as appropriate.   Minna Merritts, PT, MPT  Percell Locus 05/28/2021, 11:20 AM

## 2021-05-28 NOTE — Telephone Encounter (Signed)
Pt's chart reviewed today 05/28/21.  Pt is still currently admitted.

## 2021-05-28 NOTE — Progress Notes (Signed)
PROGRESS NOTE  Jimmy West STM:[id number] DOB: 1951/06/02   PCP: Pcp, No  Patient is from: Home.  Uses rolling walker at baseline.  DOA: 05/07/2021 LOS: 55  Chief complaints:  Chief Complaint  Patient presents with   Shortness of Breath     Brief Narrative / Interim history: 70 year old M with PMH of stage IV RLL lung cancer, cancer pain, CAD/stent in 2005, chronic respiratory failure with hypoxia on 2 L, anemia of chronic disease, anxiety, depression and debility presenting with generalized weakness, shortness of breath and dry cough, and admitted for acute on chronic hypoxic respiratory failure in the setting of LLL pneumonia and non-STEMI.  Started on broad-spectrum antibiotics, steroid and IV heparin.  Cardiology consulted recommended medical management and [place] outpatient.  CTA chest negative for PE.  10/25 patient became hypoxic and required directed oxygen, started high flow nasal cannula, FiO2 54%, inpatient setting around 90s  11/1: Patient continued to require heated high flow at 40L and 45% FiO2, clinically seems stable.  Pulmonology is following.  No obvious infection.  Completed the course of antibiotics.  Currently procalcitonin negative.  Per pulmonary note differential can be Pemetrexed/Pembrolizumab toxicity (grade 3), lymphangitic spread of CA (not entirely ruled out).  Will require long taper of steroid. They are also recommending trial of infliximab-patient received first dose on 05/22/2021  11/3: Patient with significant improvement in oxygen requirement after getting infliximab yesterday, now saturating in mid 90s on 15L of oxygen with high flow.  11/4: Oxygenation continues to improve, still not at baseline.  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  Oncology saw him today and discontinued the immune therapy and they will just do surveillance at this time. Patient will need second dose of infliximab in 2 to 3 weeks after having a  repeat CT chest.  11/8: Patient again with worsening hypoxia with minimal exertion.  Currently on 10 L of oxygen when seen during morning rounds.  Discussed with Dr. Patsey Berthold and she was concerned about his deterioration and death, as these can be fatal.  Patient is very high risk for deterioration and death secondary to advance lung cancer and recent exacerbation of interstitial lung disease, there is still a concern of drug related pneumonitis but underlying lymphangitic spread of his cancer cannot be excluded.  Subjective: Patient was again having worsening shortness of breath with minor exertion like moving from bed to chair or bedside commode.  Desaturating during those events and taking a long time to recover.  Objective: Vitals:   05/20/21 1200 05/20/21 1317 05/20/21 1333 05/20/21 1551  BP: 116/65   91/71  Pulse: 98   87  Resp: 18   17  Temp: 98.1 F (36.7 C)   98.3 F (36.8 C)  TempSrc:      SpO2: 92% 90% 91% 96%  Weight:      Height:        Intake/Output Summary (Last 24 hours) at 05/20/2021 1747 Last data filed at 05/20/2021 1047 Gross per 24 hour  Intake 480 ml  Output 2700 ml  Net -2220 ml   Filed Weights   05/07/21 1439 05/10/21 0901 05/10/21 1537  Weight: 58.4 kg 63.2 kg 64 kg    Examination:  General.  Frail and malnourished gentleman, in no acute distress. Pulmonary.  Lungs clear bilaterally, normal respiratory effort. CV.  Regular rate and rhythm, no JVD, rub or murmur. Abdomen.  Soft, nontender, nondistended, BS positive. CNS.  Alert and oriented x3.  No focal neurologic deficit. Extremities.  No edema, no cyanosis, pulses intact and symmetrical. Psychiatry.  Judgment and insight appears normal.    Procedures:  None  Microbiology summarized: COVID-19 and influenza PCR nonreactive. MRSA PCR screen negative. Blood cultures NGTD.  Assessment & Plan: Acute on chronic respiratory failure with hypoxia due to LLL pneumonia with superimposed stage IV  RLL lung cancer-on 2 L at baseline.  Oxygen requirement improved to 15 L with high flow after getting infliximab yesterday. .  CTA chest negative for PE but concern for LLL pneumonia with RLL scarring from known lung cancer.   Blood cultures negative. On cefepime since 10/18 making today day 7. S/p vancomycin. Oxygen requirement seems stable at this time - s/p Cefepime completed 7 days. S/p azithromycin 500 x 3 doses  - check CXR to eval for parapneumonic effusion which would require extended course abx - stop solumedrol, start oral prednisone, plan to wean -Wean oxygen as able. -IS/OOB/PT/OT - palliative consulted - Dr. B of oncology aware of the patient-this stopped immune therapy due to concern of drug toxicity and will just do surveillance at this time. -Pulmonology was also consulted and there is a concern of lymphangitis spread versus drug toxicity. -Pulmonary is recommending prolonged taper of steroid. -Received first dose of infliximab and repeat in 2 weeks as he did showed improvement. -Per pulmonology they will repeat infliximab in 2 to 3 weeks and he will need a repeat CT chest before getting the second dose.  Severe sepsis due to pneumonia:tachycardia, tachypnea, leukocytosis, AKI with pneumonia POA. Sepsis physiology resolved -Management as above.   NSTEMI/history of CAD/stent in 2005: Troponin 3153 -> 3506 -> 2927 -> 2894.  EKG with T wave changes in lateral leads but no dynamic changes.  TTE with LVEF of 55 to 60%, G1-DD and no RWMA chest pain-free.  -Completed 48 hours of IV heparin and transition back to home Eliquis. -Medical management due to poor candidacy for heart cath in the setting of respiratory failure -Cardiology recommends outpatient follow-up when stable from respiratory standpoint.  Chronic diastolic CHF: TTE as above.  Appears euvolemic on exam.  Does not seem to be on diuretics at home.  About 2100 ml UOP/24 hours. -Monitor fluid status   AKI/azotemia on  CKD-3A: Improved.  Does not seem to be on nephrotoxic meds.  Could be hemodynamically mediated Recent Labs    05/11/21 0555 05/12/21 0530 05/13/21 0515 05/14/21 0706 05/15/21 0445 05/16/21 0603 05/17/21 0548 05/18/21 0620 05/19/21 0457 05/20/21 0500  BUN 42* 48* 41* 33* 33* 41* 46* 40* 41* 33*  CREATININE 1.39* 1.32* 1.22 1.13 1.15 1.41* 1.40* 1.16 1.16 1.09  -Continue monitoring -Avoid nephrotoxic meds.  Hypotension: Resolved.   Stage IV lung cancer s/p radiation and chemotherapy previously.   -Outpatient follow-up - oncology following    Anemia of chronic disease: H&H stable after 1 unit Recent Labs    05/09/21 0701 05/10/21 0517 05/11/21 0555 05/12/21 0530 05/15/21 0445 05/16/21 0603 05/17/21 0548 05/18/21 0620 05/19/21 0457 05/20/21 0500  HGB 8.1* 7.6* 9.3* 9.8* 10.3* 11.0* 10.2* 10.2* 9.6* 9.8*  -Continue monitoring   Chronic pain syndrome: -Continue oxycodone and bowel regimen as needed.   Depression and Anxiety: Stable -Continue with Fluoxetine 40 g p.o. daily as well as Olanzapine 10 mg p.o. nightly   Hx of PE: CTA chest negative for PE. -Continue home Eliquis  Physical deconditioning-uses rolling walker at baseline. -Therapy recommends SNF-cannot go until oxygen requirement does not go above 6L even with exertion.  Leukocytosis/bandemia-likely from steroid.  Goal  of care counseling-patient with comorbidity as above.  Very frail and deconditioned.  Poor long-term prognosis.  Appropriately DNR/DNI.  A candidate for hospice -Palliative medicine consulted.  Body mass index is 22.77 kg/m.    DVT prophylaxis:  apixaban (ELIQUIS) tablet 5 mg  Code Status: DNR/DNI Family Communication: Discussed with POA on phone.   Level of care: Progressive Cardiac Status is: Inpatient  Remains inpatient appropriate because: Not medically stable.  Continue to require higher level of oxygen with minor exertion.  Consultants:  Cardiology Pulmonology Palliative  medicine Oncology  Sch Meds:  Scheduled Meds:  apixaban  5 mg Oral BID   atorvastatin  40 mg Oral Daily   budesonide (PULMICORT) nebulizer solution  0.5 mg Nebulization BID   Chlorhexidine Gluconate Cloth  6 each Topical Daily   feeding supplement  237 mL Oral TID BM   FLUoxetine  40 mg Oral Daily   fluticasone furoate-vilanterol  1 puff Inhalation Daily   guaiFENesin  1,200 mg Oral BID   ipratropium-albuterol  3 mL Nebulization Q6H   OLANZapine  10 mg Oral QHS   predniSONE  40 mg Oral Q breakfast   sulfamethoxazole-trimethoprim  1 tablet Oral Once per day on Mon Wed Fri   Continuous Infusions:  sodium chloride 10 mL/hr (05/13/21 1620)   PRN Meds:.sodium chloride, acetaminophen, ALPRAZolam, alum & mag hydroxide-simeth, docusate sodium, guaiFENesin-dextromethorphan, ondansetron (ZOFRAN) IV, ondansetron, oxyCODONE, polyethylene glycol, sodium chloride  Antimicrobials: Anti-infectives (From admission, onward)    Start     Dose/Rate Route Frequency Ordered Stop   05/17/21 0900  sulfamethoxazole-trimethoprim (BACTRIM DS) 800-160 MG per tablet 1 tablet        1 tablet Oral Once per day on Mon Wed Fri 05/16/21 0935     05/13/21 1030  ceFEPIme (MAXIPIME) 2 g in sodium chloride 0.9 % 100 mL IVPB        2 g 200 mL/hr over 30 Minutes Intravenous Every 12 hours 05/13/21 0952 05/13/21 1959   05/11/21 1630  azithromycin (ZITHROMAX) 500 mg in sodium chloride 0.9 % 250 mL IVPB        500 mg 250 mL/hr over 60 Minutes Intravenous Every 24 hours 05/11/21 1537 05/13/21 1721   05/08/21 1800  vancomycin (VANCOREADY) IVPB 750 mg/150 mL  Status:  Discontinued        750 mg 150 mL/hr over 60 Minutes Intravenous Every 24 hours 05/08/21 1135 05/09/21 1448   05/08/21 0800  ceFEPIme (MAXIPIME) 2 g in sodium chloride 0.9 % 100 mL IVPB  Status:  Discontinued        2 g 200 mL/hr over 30 Minutes Intravenous Every 12 hours 05/07/21 1929 05/13/21 0952   05/07/21 2200  ceFEPIme (MAXIPIME) 2 g in sodium  chloride 0.9 % 100 mL IVPB  Status:  Discontinued        2 g 200 mL/hr over 30 Minutes Intravenous Every 12 hours 05/07/21 1927 05/07/21 1929   05/07/21 1930  vancomycin (VANCOREADY) IVPB 500 mg/100 mL        500 mg 100 mL/hr over 60 Minutes Intravenous  Once 05/07/21 1927 05/07/21 2144   05/07/21 1927  vancomycin variable dose per unstable renal function (pharmacist dosing)  Status:  Discontinued         Does not apply See admin instructions 05/07/21 1927 05/09/21 1448   05/07/21 1600  vancomycin (VANCOCIN) IVPB 1000 mg/200 mL premix        1,000 mg 200 mL/hr over 60 Minutes Intravenous  Once 05/07/21 1552 05/07/21  1801   05/07/21 1600  ceFEPIme (MAXIPIME) 2 g in sodium chloride 0.9 % 100 mL IVPB        2 g 200 mL/hr over 30 Minutes Intravenous  Once 05/07/21 1552 05/07/21 1700        I have personally reviewed the following labs and images: CBC: Recent Labs  Lab 05/16/21 0603 05/17/21 0548 05/18/21 0620 05/19/21 0457 05/20/21 0500  WBC 17.5* 14.9* 15.2* 12.4* 11.8*  HGB 11.0* 10.2* 10.2* 9.6* 9.8*  HCT 33.6* 31.4* 31.5* 30.3* 31.5*  MCV 96.3 94.0 95.2 94.7 96.3  PLT 196 186 184 175 193   BMP &GFR Recent Labs  Lab 05/14/21 0706 05/15/21 0445 05/16/21 0603 05/17/21 0548 05/18/21 0620 05/19/21 0457 05/20/21 0500  NA 143 140 139 135 137 136 139  K 4.0 4.1 3.8 3.9 4.6 4.6 4.5  CL 104 102 99 94* 97* 96* 98  CO2 32 34* 34* 35* 33* 32 35*  GLUCOSE 96 109* 129* 154* 105* 95 104*  BUN 33* 33* 41* 46* 40* 41* 33*  CREATININE 1.13 1.15 1.41* 1.40* 1.16 1.16 1.09  CALCIUM 9.0 8.5* 8.7* 8.5* 8.8* 8.9 9.0  MG 2.1 2.1 1.9 2.0  --   --   --   PHOS  --  3.6 4.5 4.1  --   --   --    Estimated Creatinine Clearance: 56.9 mL/min (by C-G formula based on SCr of 1.09 mg/dL). Liver & Pancreas: No results for input(s): AST, ALT, ALKPHOS, BILITOT, PROT, ALBUMIN in the last 168 hours.  No results for input(s): LIPASE, AMYLASE in the last 168 hours. No results for input(s): AMMONIA  in the last 168 hours. Diabetic: No results for input(s): HGBA1C in the last 72 hours. No results for input(s): GLUCAP in the last 168 hours.  Cardiac Enzymes: No results for input(s): CKTOTAL, CKMB, CKMBINDEX, TROPONINI in the last 168 hours. No results for input(s): PROBNP in the last 8760 hours. Coagulation Profile: No results for input(s): INR, PROTIME in the last 168 hours.  Thyroid Function Tests: No results for input(s): TSH, T4TOTAL, FREET4, T3FREE, THYROIDAB in the last 72 hours. Lipid Profile: No results for input(s): CHOL, HDL, LDLCALC, TRIG, CHOLHDL, LDLDIRECT in the last 72 hours.  Anemia Panel: No results for input(s): VITAMINB12, FOLATE, FERRITIN, TIBC, IRON, RETICCTPCT in the last 72 hours.  Urine analysis:    Component Value Date/Time   COLORURINE YELLOW (A) 05/07/2021 2044   APPEARANCEUR CLEAR (A) 05/07/2021 2044   LABSPEC 1.021 05/07/2021 2044   PHURINE 6.0 05/07/2021 2044   GLUCOSEU NEGATIVE 05/07/2021 2044   HGBUR NEGATIVE 05/07/2021 2044   BILIRUBINUR NEGATIVE 05/07/2021 2044   [place] 05/07/2021 2044   PROTEINUR NEGATIVE 05/07/2021 2044   NITRITE NEGATIVE 05/07/2021 2044   LEUKOCYTESUR NEGATIVE 05/07/2021 2044   Sepsis Labs: Invalid input(s): PROCALCITONIN, [place]  Microbiology: No results found for this or any previous visit (from the past 240 hour(s)).   Radiology Studies: No results found.   Lorella Nimrod, MD [facility]  If 7PM-7AM, please contact night-coverage [email or web address] 05/20/2021, 5:47 PM

## 2021-05-28 NOTE — Telephone Encounter (Signed)
Hayley- form signed and placed downstairs for pt family to pick up. Will you call daughter and let her know. Thanks.Nira Conn

## 2021-05-28 NOTE — Progress Notes (Signed)
Occupational Therapy Treatment Patient Details Name: Jimmy West MRN: [id number] DOB: 07/21/1951 Today's Date: 05/28/2021   History of present illness 70 y.o. male was brought to hosp to admit 10/18 for hypoxia, weakness, SOB and noted sepsis from PNA, as well as NSTEMI.  Troponins are high from demand ischemia, EF 40-45%, and has recent asp PNA per chart.  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 On 10 L O2 Jimmy West was able to engage in grooming, upper body and lower body dressing while sitting EOB. O2 sats in mid 90s supine, dropping to mid 80s with EOB activity, but able to return to upper 80s-lower 90s w/in minutes of rest breaks and deep breathing. Provided ongoing educ re: breathing, relaxation techniques, energy conservation strategies. Pt verbalizes understand and is able to demonstrate/utilize techniques. Pt continues to benefit from ongoing OT services.    Recommendations for follow up therapy are one component of a multi-disciplinary discharge planning process, led by the attending physician.  Recommendations may be updated based on patient status, additional functional criteria and insurance authorization.    Follow Up Recommendations  Skilled nursing-short term rehab (<3 hours/day)    Assistance Recommended at Discharge Intermittent Supervision/Assistance  Equipment Recommendations       Recommendations for Other Services      Precautions / Restrictions Precautions Precautions: Fall Precaution Comments: monitor sats and HR; mod fall risk Restrictions Weight Bearing Restrictions: No Other Position/Activity Restrictions: Watch  SpO2       Mobility Bed Mobility Overal bed mobility: Needs Assistance Bed Mobility: Supine to Sit;Sit to Supine     Supine to sit: Supervision Sit to supine: Supervision   General bed mobility comments: Requires increased time, frequent rest breaks, for supine<sit, cueing for deep breathing    Transfers Overall transfer level: Needs assistance     Sit to Stand: Supervision           General transfer comment: Increased time, able to stand [facility] for pulling up pants with no physical assist, no LOB     Balance Overall balance assessment: Needs assistance Sitting-balance support: Single extremity supported;Feet supported Sitting balance-Leahy Scale: Good Sitting balance - Comments: Pt had difficulty maintaining erect sitting posture, 2/2 SOB. Utilizes L lateral lean for UB support Postural control: Left lateral lean Standing balance support: No upper extremity supported Standing balance-Leahy Scale: Good Standing balance comment: Good standing balance, but only able to maintain briefly, requesting to return to sitting, 2/2 SOB                           ADL either performed or assessed with clinical judgement   ADL Overall ADL's : Needs assistance/impaired     Grooming: Wash/dry face;Sitting;Set up;Cueing for compensatory techniques;Min guard Grooming Details (indicate cue type and reason): frequent rest breaks required while monitoring O2 Upper Body Bathing: Min guard;Set up;Cueing for compensatory techniques Upper Body Bathing Details (indicate cue type and reason): frequent rest breaks required while monitoring O2     Upper Body Dressing : Minimal assistance   Lower Body Dressing: Moderate assistance;Sitting/lateral leans;Sit to/from stand Lower Body Dressing Details (indicate cue type and  reason): Mod A for doffing, threading pant legs                    Extremity/Trunk Assessment Upper Extremity Assessment Upper Extremity Assessment:  Generalized weakness   Lower Extremity Assessment Lower Extremity Assessment: Generalized weakness   Cervical / Trunk Assessment Cervical / Trunk Assessment: Normal    Vision       Perception     Praxis      Cognition Arousal/Alertness: Awake/alert Behavior During Therapy: WFL for tasks assessed/performed                                              Exercises Other Exercises Other Exercises: Grooming, UB and LB dressing. Educ re: breathing, relaxation techniques and ECS.   Shoulder Instructions       General Comments      Pertinent Vitals/ Pain       Pain Assessment: 0-10 Pain Score: 5  Pain Location: back Pain Descriptors / Indicators: Aching;Pressure Pain Intervention(s): Repositioned  Home Living                                          Prior Functioning/Environment              Frequency  Min 2X/week        Progress Toward Goals  OT Goals(current goals can now be found in the care plan section)  Progress towards OT goals: Progressing toward goals  Acute Rehab OT Goals OT Goal Formulation: With patient Time For Goal Achievement: 06/06/21 Potential to Achieve Goals: Good  Plan Discharge plan remains appropriate;Frequency remains appropriate    Co-evaluation                 AM-PAC OT "6 Clicks" Daily Activity     Outcome Measure   Help from another person eating meals?: None Help from another person taking care of personal grooming?: A Little Help from another person toileting, which includes using toliet, bedpan, or urinal?: A Little Help from another person bathing (including washing, rinsing, drying)?: A Lot Help from another person to put on and taking off regular upper body clothing?: A Little Help from another person to put on and taking off regular lower body clothing?: A Lot 6 Click Score: 17    End of Session Equipment Utilized During Treatment: Oxygen  OT Visit Diagnosis: Other  abnormalities of gait and mobility (R26.89);Muscle weakness (generalized) (M62.81);Unsteadiness on feet (R26.81)   Activity Tolerance Patient tolerated treatment well   Patient Left in bed;with call bell/phone within reach;with bed alarm set   Nurse Communication          Time: 4536-4680 OT Time Calculation (min): 31 min  Charges: OT General Charges $OT Visit: 1 Visit OT Treatments $Self Care/Home Management : 23-37 mins  Josiah Lobo, PhD, MS, OTR/L 05/28/21, 10:08 AM

## 2021-05-28 NOTE — Telephone Encounter (Signed)
DR. B- form placed in your sign folder for you to approve.

## 2021-05-29 ENCOUNTER — Inpatient Hospital Stay: Payer: Medicare HMO

## 2021-05-29 ENCOUNTER — Encounter: Payer: Self-pay | Admitting: Internal Medicine

## 2021-05-29 ENCOUNTER — Encounter: Payer: Self-pay | Admitting: Hematology and Oncology

## 2021-05-29 DIAGNOSIS — J9601 Acute respiratory failure with hypoxia: Secondary | ICD-10-CM | POA: Diagnosis not present

## 2021-05-29 DIAGNOSIS — J849 Interstitial pulmonary disease, unspecified: Secondary | ICD-10-CM | POA: Diagnosis not present

## 2021-05-29 LAB — CBC
HCT: 31 % — ABNORMAL LOW (ref 39.0–52.0)
Hemoglobin: 9.7 g/dL — ABNORMAL LOW (ref 13.0–17.0)
MCH: 30.4 pg (ref 26.0–34.0)
MCHC: 31.3 g/dL (ref 30.0–36.0)
MCV: 97.2 fL (ref 80.0–100.0)
Platelets: 170 10*3/uL (ref 150–400)
RBC: 3.19 MIL/uL — ABNORMAL LOW (ref 4.22–5.81)
RDW: 16.7 % — ABNORMAL HIGH (ref 11.5–15.5)
WBC: 9.3 10*3/uL (ref 4.0–10.5)
nRBC: 0 % (ref 0.0–0.2)

## 2021-05-29 LAB — RENAL FUNCTION PANEL
Albumin: 2.6 g/dL — ABNORMAL LOW (ref 3.5–5.0)
Anion gap: 6 (ref 5–15)
BUN: 30 mg/dL — ABNORMAL HIGH (ref 8–23)
CO2: 34 mmol/L — ABNORMAL HIGH (ref 22–32)
Calcium: 9 mg/dL (ref 8.9–10.3)
Chloride: 99 mmol/L (ref 98–111)
Creatinine, Ser: 1.06 mg/dL (ref 0.61–1.24)
GFR, Estimated: >60 mL/min (ref >60)
Glucose, Bld: 89 mg/dL (ref 70–99)
Phosphorus: 4.5 mg/dL (ref 2.5–4.6)
Potassium: 4.6 mmol/L (ref 3.5–5.1)
Sodium: 139 mmol/L (ref 135–145)

## 2021-05-29 NOTE — Progress Notes (Signed)
PROGRESS NOTE    Jimmy West  JSE:[id number] DOB: November 02, 1950 DOA: 05/07/2021 PCP: Pcp, No  253A/253A-AA   Assessment & Plan:   Active Problems:   Primary cancer of right lower lobe of lung (HCC)   Acute hypoxemic respiratory failure (HCC)   SOB (shortness of breath)   Severe protein-calorie malnutrition (HCC)   Non-ST elevation (NSTEMI) myocardial infarction [facility])   70 year old M with PMH of stage IV RLL lung cancer, cancer pain, CAD/stent in 2005, chronic respiratory failure with hypoxia on 2 L, anemia of chronic disease, anxiety, depression and debility presenting with generalized weakness, shortness of breath and dry cough, and admitted for acute on chronic hypoxic respiratory failure in the setting of LLL pneumonia and non-STEMI.  Started on broad-spectrum antibiotics, steroid and IV heparin.  Cardiology consulted recommended medical management and [facility] outpatient.  CTA chest negative for PE.   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 DDX: Steroid refractory ICI toxicity (grade 3), lymphangitic spread of CA (not entirely ruled out) - Procalcitonin's have been negative, KL6 >> 1,866 (reference 0-500) - Fungitell negative - Received infliximab 5 mg/kg x 1, 11/02, repeat in 2- 3 weeks x1 - Has been titrated to 5 Lpm Plan: --Continue supplemental O2 to keep sats between 88-92%, wean as tolerated - Continue Brovana with Pulmicort twice a day via neb - Continue Yupelri once daily via neb - Continue prednisone at 30 mg daily (started 11/2), taper as O2 requirements decrease - PJP prophylaxis Bactrim DS Monday Wednesday Friday - Will need taper of steroids over 4 to 6-week period of time  Pneumonia ruled out Procalcitonin is normal Persistent findings on CT with differential as above -No need for  antibiotics at present except for PJP prophylaxis -Aspiration precautions   ACUTE KIDNEY INJURY improving - Avoid nephrotoxic agents as able - Follow urine output, BMP - Monitor closely on PJP prophylaxis - Ensure adequate renal perfusion, optimize oxygenation   Anemia, chronic, secondary to cancer treatment - stable - Monitor routine CBC   Elevated d-dimer  History PE on Eliquis - CTA Chest 10/18 negative for PE - High risk for PE recurrence due to underlying malignancy --cont Eliquis   Sepsis, ruled out --Pulm ruled out pneumonia, so no source of infection to qualify for sepsis.   NSTEMI history of CAD/stent in 2005: Troponin 3153 -> 3506 -> 2927 -> 2894.  EKG with T wave changes in lateral leads but no dynamic changes.  TTE with LVEF of 55 to 60%, G1-DD and no RWMA chest pain-free.  -Completed 48 hours of IV heparin and transition back to home Eliquis. -Medical management due to poor candidacy for heart cath in the setting of respiratory failure -Cardiology recommends outpatient follow-up when stable from respiratory standpoint.   Chronic diastolic CHF:  TTE as above.  Appears euvolemic on exam.  Does not seem to be on diuretics at home.     AKI/azotemia on CKD-3A: Improved.    Hypotension: Resolved.   Stage IV lung cancer s/p radiation and chemotherapy previously.   - oncology following    Anemia of chronic disease:  H&H stable after 1 unit -Continue monitoring   Chronic pain syndrome: -Continue oxycodone and bowel regimen as needed.   Depression and Anxiety: Stable -cont Fluoxetine and olanzapine   Physical deconditioning -uses rolling walker at baseline. -Therapy recommends SNF-cannot go until oxygen requirement does not  go above 6L even with exertion.   Leukocytosis/bandemia -likely from steroid.   Goal of care counseling -patient with comorbidity as above.  Very frail and deconditioned.  Poor long-term prognosis.  Appropriately DNR/DNI.  A candidate for  hospice -Palliative medicine consulted.   DVT prophylaxis: [id number] Code Status: DNR  Family Communication:  Level of care: Progressive Cardiac Dispo:   The patient is from: home Anticipated d/c is to: SNF Anticipated d/c date is: undetermined Patient currently is not medically ready to d/c due to: unstable respiratory status and O2 requirement   Subjective and Interval History:  Pt reported breathing better.  Normal oral intake, urination and BM.   Objective: Vitals:   05/29/21 1417 05/29/21 1700 05/29/21 1947 05/29/21 2045  BP:  110/74 116/83   Pulse:   92   Resp:  18 18   Temp:  97.7 F (36.5 C) 97.9 F (36.6 C)   TempSrc:  Oral    SpO2: 92% 94% 98% 94%  Weight:      Height:        Intake/Output Summary (Last 24 hours) at 05/29/2021 2343 Last data filed at 05/29/2021 1700 Gross per 24 hour  Intake 720 ml  Output 1100 ml  Net -380 ml   Filed Weights   05/10/21 0901 05/10/21 1537 05/29/21 0343  Weight: 63.2 kg 64 kg 60.1 kg    Examination:   Constitutional: NAD, AAOx3 HEENT: conjunctivae and lids normal, EOMI CV: No cyanosis.   RESP: normal respiratory effort, on 5L Extremities: No effusions, edema in BLE SKIN: warm, dry Neuro: II - XII grossly intact.   Psych: Normal mood and affect.  Appropriate judgement and reason   Data Reviewed: I have personally reviewed following labs and imaging studies  CBC: Recent Labs  Lab 05/25/21 0620 05/29/21 0510  WBC 10.4 9.3  HGB 9.8* 9.7*  HCT 30.7* 31.0*  MCV 97.2 97.2  PLT 161 537   Basic Metabolic Panel: Recent Labs  Lab 05/29/21 0510  NA 139  K 4.6  CL 99  CO2 34*  GLUCOSE 89  BUN 30*  CREATININE 1.06  CALCIUM 9.0  PHOS 4.5   GFR: Estimated Creatinine Clearance: 55.1 mL/min (by C-G formula based on SCr of 1.06 mg/dL). Liver Function Tests: Recent Labs  Lab 05/29/21 0510  ALBUMIN 2.6*   No results for input(s): LIPASE, AMYLASE in the last 168 hours. No results for input(s): AMMONIA in  the last 168 hours. Coagulation Profile: No results for input(s): INR, PROTIME in the last 168 hours. Cardiac Enzymes: No results for input(s): CKTOTAL, CKMB, CKMBINDEX, TROPONINI in the last 168 hours. BNP (last 3 results) No results for input(s): PROBNP in the last 8760 hours. HbA1C: No results for input(s): HGBA1C in the last 72 hours. CBG: No results for input(s): GLUCAP in the last 168 hours. Lipid Profile: No results for input(s): CHOL, HDL, LDLCALC, TRIG, CHOLHDL, LDLDIRECT in the last 72 hours. Thyroid Function Tests: No results for input(s): TSH, T4TOTAL, FREET4, T3FREE, THYROIDAB in the last 72 hours. Anemia Panel: No results for input(s): VITAMINB12, FOLATE, FERRITIN, TIBC, IRON, RETICCTPCT in the last 72 hours. Sepsis Labs: No results for input(s): PROCALCITON, LATICACIDVEN in the last 168 hours.  No results found for this or any previous visit (from the past 240 hour(s)).    Radiology Studies: DG Chest 2 View  Result Date: 05/28/2021 CLINICAL DATA:  Difficulty breathing EXAM: CHEST - 2 VIEW COMPARISON:  Previous studies including the chest radiograph done on 05/24/2021 FINDINGS: Transverse  diameter of heart is increased. Increased markings are seen in parahilar regions and lower lung fields, more so on the right side. There is decreased volume in both lungs. Lateral CP angles are clear. There is no pneumothorax. Tip central venous catheter is seen in right atrium. IMPRESSION: Increased interstitial and alveolar markings in both lungs suggest pulmonary fibrosis and possibly superimposed pneumonitis. No significant interval changes are noted since 05/24/2021. Electronically Signed   By: Elmer Picker M.D.   On: 05/28/2021 08:27   CT CHEST WO CONTRAST  Result Date: 05/29/2021 CLINICAL DATA:  70 year old male with history of acute interstitial pneumonitis. Shortness of breath. History of lung cancer. EXAM: CT CHEST WITHOUT CONTRAST TECHNIQUE: Multidetector CT imaging of  the chest was performed following the standard protocol without IV contrast. COMPARISON:  Chest CT 05/15/2021. FINDINGS: Cardiovascular: Heart size is enlarged with left ventricular dilatation, most notably in the apex where there appears to be mild aneurysmal dilatation, as well as areas of dystrophic calcification, presumably from prior distal LAD territory myocardial infarction(s). There is no significant pericardial fluid, thickening or pericardial calcification. There is aortic atherosclerosis, as well as atherosclerosis of the great vessels of the mediastinum and the coronary arteries, including calcified atherosclerotic plaque in the left main, left anterior descending and right coronary arteries. Right internal jugular single-lumen porta cath with tip terminating in the right atrium. Mediastinum/Nodes: No pathologically enlarged mediastinal or hilar lymph nodes. Please note that accurate exclusion of hilar adenopathy is limited on noncontrast CT scans. Esophagus is unremarkable in appearance. No axillary lymphadenopathy. Lungs/Pleura: Widespread but patchy areas of airspace consolidation, ground-glass attenuation, septal thickening, thickening of the peribronchovascular interstitium, cylindrical and varicose bronchiectasis, and regional areas of architectural distortion are again noted throughout the lungs bilaterally. Overall, aeration appears slightly worsened, particularly in the lower lobes of the lungs bilaterally. Treated right lower lobe neoplasm is obscured by the evolving parenchymal process in the lungs. There is also diffuse bronchial wall thickening with moderate centrilobular and paraseptal emphysema. Trace right pleural effusion, unchanged. No definite left pleural effusion Upper Abdomen: Aortic atherosclerosis. Exophytic low-attenuation lesion in the upper pole the right kidney measuring 2.1 cm in diameter, incompletely characterized on today's non-contrast CT examination, but similar to the  prior study and statistically likely to represent a cyst. Musculoskeletal: Chronic compression fracture of T4 with vertebral plana appearance, similar to the prior examination. There are no aggressive appearing lytic or blastic lesions noted in the visualized portions of the skeleton. IMPRESSION: . 1. Overall, there is been slight worsened aeration in the lower lungs when compared to the recent prior examination. Findings could be consistent with reported clinical history of acute interstitial pneumonitis. 2. Aortic atherosclerosis, in addition to left main and 2 vessel coronary artery disease. Assessment for potential risk factor modification, dietary therapy or pharmacologic therapy may be warranted, if clinically indicated. 3. Diffuse bronchial wall thickening with moderate centrilobular and paraseptal emphysema; imaging findings suggestive of underlying COPD. Aortic Atherosclerosis (ICD10-I70.0) and Emphysema (ICD10-J43.9). Electronically Signed   By: Vinnie Langton M.D.   On: 05/29/2021 11:10     Scheduled Meds:  apixaban  5 mg Oral BID   arformoterol  15 mcg Nebulization BID   atorvastatin  40 mg Oral Daily   budesonide (PULMICORT) nebulizer solution  0.5 mg Nebulization BID   Chlorhexidine Gluconate Cloth  6 each Topical Daily   feeding supplement  237 mL Oral TID BM   FLUoxetine  40 mg Oral Daily   guaiFENesin  1,200 mg Oral  BID   OLANZapine  10 mg Oral QHS   predniSONE  30 mg Oral Q breakfast   revefenacin  175 mcg Nebulization Daily   sodium chloride flush  10-40 mL Intracatheter Q12H   sulfamethoxazole-trimethoprim  1 tablet Oral Once per day on Mon Wed Fri   Continuous Infusions:  sodium chloride 10 mL/hr (05/13/21 1620)     LOS: 22 days     Enzo Bi, MD [facility] If 7PM-7AM, please contact night-coverage 05/29/2021, 11:43 PM

## 2021-05-29 NOTE — Progress Notes (Signed)
PT Cancellation Note  Patient Details Name: Jarious Lyon MRN: [id number] DOB: 12/04/1950   Cancelled Treatment:    Reason Eval/Treat Not Completed: Fatigue/lethargy limiting ability to participate. Reports he is tired, did not sleep well last night. Despite encouragement he declines PT at this time. Will continue to follow.     Camelia Stelzner 05/29/2021, 2:10 PM

## 2021-05-29 NOTE — Progress Notes (Addendum)
NAME:  Jimmy West, MRN:  [id number], DOB:  Mar 19, 1951, LOS: 83 ADMISSION DATE:  05/07/2021, INITIAL CONSULTATION DATE: 05/08/2021 REFERRING MD: Dr. Billie Ruddy ; follow-up requested by Dr. Dwyane Dee CHIEF COMPLAINT: Shortness of breath, bilateral infiltrates.  Follow-up requested from initial consultation on 08 May 2021.  Persistent respiratory failure with hypoxia.  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 Patient confirms DNR/DNI code status today and was followed by palliative care team during previous admission. He has one living brother who lives in [place] and has not spoken to him in years. His preferred contact person is Jimmy West (information in patient records). Patient lives alone and does not have assistance at home.    Patient presented to the ER via EMS last night 10/18 with chief complaint of shortness of breath. Patient reports that he has not felt well since his hospital discharge with profound weakness, too weak to eat, and worsening shortness of breath. He reports that he started wearing his home oxygen all of the time due to not feeling well which helped his pulmonary symptoms some. He states he was not using his prescribed inhaler because the pump was malfunctioning on the handheld device. He also describes wheezing and dry, nonproductive cough which have worsened in the last several days. He denies recent fever, chills, sweating, nausea, vomiting, swelling. He reports small volume loose stools occasionally which is not his  routine bowel movements.    On arrival to the ER, patient with noted oxygen saturations in the 80s on room air by EMS with blood pressure systolic in the 38F after a 200cc fluid bolus. Vital signs on arrival pulse 97, BP 103/74, RR 36, oxygen saturations 91% on 6L, afebrile. Patient noted to easily desaturate with activity, speaking, and any interruption to his oxygen flow such as when he was transitioned to 8L high flow nasal cannula with humidifier. Labs noted WBC 16.6, hgb 9.9, Creatinine 1.71, BNP 1105, troponin 3153 and 3506 on repeat. COVID and flu screening negative. Imaging: CXR findings of new left lower lobe airspace process suspicious for pneumonia with extensive right lower lobe density consistent with radiation change. D-dimer was elevated, and CTA of the chest was done which was negative for PE. Patient had sepsis bundle initiated and received 2 liters LR fluid resuscitation, empiric vancomycin and cefepime in the ER. He was started on a heparin continuous infusion for NSTEMI suspected demand ischemia on history of CAD with remote PCI.    PCCM was consulted to assess patient. He is currently being seen in the ER pending bed placement inpatient, tolerating 8 liters highflow oxygen cannula at oxygen saturations 92-93%; however, he has noted increased work of breathing with accessory muscles of breathing use, shortness of breath worsening with conversation with short shallow breaths between words. Patient is alert, oriented to self, location, situation and time. He denies any other recent events other than what is described above. He has never used a BiPAP or other NIPPV support device before but is agreeable to trial on BiPAP for a short duration if his respiratory condition worsens. He confirms DNI status as part of  DNR and that he would not want intubation to support his breathing.   Hospital course from 19 October - 26 Oct : Patient has presented with acute on chronic respiratory failure with  hypoxia with CT imaging with extensive differential diagnosis.  He has been treated as "worsening pneumonia" with increasing FiO2 requirements in the process.  He is currently on steroids.  Had initial improvement on symptoms however, now has seemed to plateau.  We are asked to render opinion as to how long steroids will be necessary.  He is currently requiring high flow O2 at 60%.  Pertinent  Medical History   Stage IV adenocarcinoma of the lung Myocardial infarction in the past Remote history of PE on Eliquis CAD hx PCI 2005 Chronic anemia secondary to cancer treatment Port-a-cath right chest Recently discharged 04/28/21 after treatment of aspiration pneumonia  Significant Hospital Events: Including procedures, antibiotic start and stop dates in addition to other pertinent events   10/18: Brought by EMS to ER with cc: sob, hypoxic sats 80% on RA and hypotensive. CXR concerning for LUL pna, sepsis with NSTEMI demand ischemia on workup. Started on cefepime, vancomycin, heparin continuous infusion and assigned to hospitalist service pending bed placement.  10/19: Evaluated by PCCM significant pulm history with recurrent pneumonia ? Aspiration versus HCAP, sepsis, NSTEMI, hypotension. Stable sats on 8lpm highflow oxygen, with increased WOB and accessory muscle use.  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  On bubble high flow at 12 L/min 11/07: Continues to improve slowly.  O2 requirements down to 5 L/min.  Still with desaturations with activity 11/09:  Still on 5 L/min O2 however does desaturate significantly with ambulation.  Interim History / Subjective:  Notes increasing dyspnea on exertion however overall feels that his breathing is better.  No cough today.  Oxygen requirements have decreased now to 5 L/min via nasal cannula.  No conversational dyspnea.    Objective   Blood pressure 116/83, pulse 92, temperature 97.9 F (36.6 C), resp. rate 18, height 5' 6"  (1.676 m), weight 60.1 kg, SpO2 98 %.    SpO2: 98 % O2 Flow Rate (L/min): 5 L/min FiO2 (%): 40 %  Intake/Output Summary (Last 24 hours) at 05/29/2021 2023 Last data filed at 05/29/2021 1700 Gross per 24 hour  Intake 720 ml  Output 1100 ml  Net -380 ml    Filed Weights   05/10/21 0901 05/10/21 1537 05/29/21 0343  Weight: 63.2 kg 64 kg 60.1 kg    Examination: GENERAL: Chronically ill-appearing gentleman, resting in bed, on nasal cannula O2, comfortable, no conversational dyspnea noted today.   HEAD: Normocephalic, atraumatic.  EYES: Pupils equal, round, reactive to light.  No scleral icterus.  MOUTH: Oral mucosa moist. NECK: Supple. No thyromegaly. Trachea midline. No JVD.  No adenopathy. PULMONARY: Good air entry bilaterally.  Coarse throughout, no other adventitious sounds. CARDIOVASCULAR: S1 and S2. Regular rate and rhythm.  No rubs, murmurs or gallops heard. ABDOMEN: Scaphoid, otherwise benign. MUSCULOSKELETAL: No joint deformity, no clubbing, no edema.  Diffuse muscle wasting. NEUROLOGIC: No overt focal deficit, gait not  tested.   SKIN: Intact,warm,dry. PSYCH: Conversant.  Mood and behavior normal.  Resolved Hospital Problem list   N/A  Assessment & Plan:  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 DDX: Steroid refractory ICI toxicity (grade 3), lymphangitic spread of CA (not entirely ruled  out) - Titrate oxygen supplementation to maintain O2 sats 88-92% - Has been titrated to 5 Lpm - Continue Brovana with Pulmicort twice a day via neb - Continue Yupelri once daily via neb - Continue prednisone at 30 mg daily (started 11/2), taper as O2 requirements decrease - PJP prophylaxis Bactrim DS Monday Wednesday Friday - Will need taper of steroids over 4 to 6-week period of time - Procalcitonin's have been negative, KL6 >> 1,866 (reference 0-500) - Fungitell negative - Immunoglobulins show modest decrease in IgG=458, consider IVIG if no further improvement - Inflammatory markers are elevated (ESR/CRP), LDH 239 - Repeated CT chest 11/09, persistent interstitial and airspace process with no significant change - Significant clinical improvement, albeit slow - Received infliximab 5 mg/kg x 1, 11/02, repeat in 2- 3 weeks x1    Pneumonia ruled out Procalcitonin is normal Persistent findings on CT with differential as above -No need for antibiotics at present except for PJP prophylaxis -Aspiration precautions -Leukocytosis >> resolved   ACUTE KIDNEY INJURY improving - Avoid nephrotoxic agents as able - Follow urine output, BMP - Monitor closely on PJP prophylaxis - Ensure adequate renal perfusion, optimize oxygenation  Anemia, chronic, secondary to cancer treatment - stable - Monitor routine CBC   Elevated d-dimer  History PE on Eliquis - CTA Chest 10/18 negative for PE - High risk for PE recurrence due to underlying malignancy - Continue Eliquis    Radiologic studies  Representative images of CT Chest 10/26 (L) and 11/09 (R):    Persistent infiltrates, cardiomegaly, slight increased aeration.  Labs   CBC: Recent Labs  Lab 05/25/21 0620 05/29/21 0510  WBC 10.4 9.3  HGB 9.8* 9.7*  HCT 30.7* 31.0*  MCV 97.2 97.2  PLT 161 170     Basic Metabolic Panel: Recent Labs  Lab 05/29/21 0510  NA 139  K 4.6  CL 99  CO2 34*  GLUCOSE 89  BUN 30*  CREATININE 1.06   CALCIUM 9.0  PHOS 4.5    GFR: Estimated Creatinine Clearance: 55.1 mL/min (by C-G formula based on SCr of 1.06 mg/dL). Recent Labs  Lab 05/25/21 0620 05/29/21 0510  WBC 10.4 9.3     Liver Function Tests: Recent Labs  Lab 05/29/21 0510  ALBUMIN 2.6*    No results for input(s): LIPASE, AMYLASE in the last 168 hours. No results for input(s): AMMONIA in the last 168 hours.  ABG    Component Value Date/Time   PHART 7.51 (H) 05/14/2021 1459   PCO2ART 38 05/14/2021 1459   PO2ART 51 (L) 05/14/2021 1459   HCO3 30.3 (H) 05/14/2021 1459   O2SAT 89.4 05/14/2021 1459     CBG: No results for input(s): GLUCAP in the last 168 hours.   Review of Systems:   A 10 point review of systems was performed and it is as noted above otherwise negative.  Allergies No Known Allergies   Scheduled Meds:  apixaban  5 mg Oral BID   arformoterol  15 mcg Nebulization BID   atorvastatin  40 mg Oral Daily   budesonide (PULMICORT) nebulizer solution  0.5 mg Nebulization  BID   Chlorhexidine Gluconate Cloth  6 each Topical Daily   feeding supplement  237 mL Oral TID BM   FLUoxetine  40 mg Oral Daily   guaiFENesin  1,200 mg Oral BID   OLANZapine  10 mg Oral QHS   predniSONE  30 mg Oral Q breakfast   revefenacin  175 mcg Nebulization Daily   sodium chloride flush  10-40 mL Intracatheter Q12H   sulfamethoxazole-trimethoprim  1 tablet Oral Once per day on Mon Wed Fri   Continuous Infusions:  sodium chloride 10 mL/hr (05/13/21 1620)   PRN Meds:.sodium chloride, acetaminophen, ALPRAZolam, alum & mag hydroxide-simeth, docusate sodium, ondansetron (ZOFRAN) IV, ondansetron, oxyCODONE, polyethylene glycol, sodium chloride, sodium chloride flush Received infliximab x1 today  Level 2 follow-up    Discussion: Patient appears to have more of an interstitial process that is failing to improve this is either steroid refractory immune checkpoint inhibitor (ICI) induced pneumonitis, grade 3, or  lymphangitic spread of tumor.  At this point ICI induced pneumonitis more likely.  KL- 6 has been shown useful in determining ILD from other modalities, this is highly elevated which is more suggestive drug-induced ILD in this situation. Continue prednisone at decreased dose of 30 mg daily, started 11/2.  He will need a 4 to 6-week slow taper.  Continue to taper off as O2 requirements decrease.  Continue PJP prophylaxis (MWF).  Challenged with infliximab 5 mg/kg x 1 11/02.  Showing slow clinical improvement since then.  Continue nebulization treatments and pulmonary hygiene.  He does have modest decrease of IgG however, may be due to acute illness.  However, if his clinical picture fails to improve significantly after infliximab may consider IVIG infusion.  Discussed with Dr. Gerrie Nordmann, MD Advanced Bronchoscopy PCCM [facility]    *This note was dictated using voice recognition software/Dragon.  Despite best efforts to proofread, errors can occur which can change the meaning.  Any change was purely unintentional.

## 2021-05-30 DIAGNOSIS — J9601 Acute respiratory failure with hypoxia: Secondary | ICD-10-CM | POA: Diagnosis not present

## 2021-05-30 LAB — CBC
HCT: 31.7 % — ABNORMAL LOW (ref 39.0–52.0)
Hemoglobin: 10 g/dL — ABNORMAL LOW (ref 13.0–17.0)
MCH: 30.4 pg (ref 26.0–34.0)
MCHC: 31.5 g/dL (ref 30.0–36.0)
MCV: 96.4 fL (ref 80.0–100.0)
Platelets: 204 10*3/uL (ref 150–400)
RBC: 3.29 MIL/uL — ABNORMAL LOW (ref 4.22–5.81)
RDW: 16.7 % — ABNORMAL HIGH (ref 11.5–15.5)
WBC: 10.3 10*3/uL (ref 4.0–10.5)
nRBC: 0 % (ref 0.0–0.2)

## 2021-05-30 LAB — BASIC METABOLIC PANEL
Anion gap: 5 (ref 5–15)
BUN: 30 mg/dL — ABNORMAL HIGH (ref 8–23)
CO2: 34 mmol/L — ABNORMAL HIGH (ref 22–32)
Calcium: 8.7 mg/dL — ABNORMAL LOW (ref 8.9–10.3)
Chloride: 99 mmol/L (ref 98–111)
Creatinine, Ser: 1.09 mg/dL (ref 0.61–1.24)
GFR, Estimated: >60 mL/min (ref >60)
Glucose, Bld: 97 mg/dL (ref 70–99)
Potassium: 4.3 mmol/L (ref 3.5–5.1)
Sodium: 138 mmol/L (ref 135–145)

## 2021-05-30 LAB — MAGNESIUM: Magnesium: 1.9 mg/dL (ref 1.7–2.4)

## 2021-05-30 NOTE — Telephone Encounter (Signed)
Message let with [name] to make her aware.

## 2021-05-30 NOTE — Progress Notes (Signed)
Physical Therapy Treatment Patient Details Name: Jimmy West MRN: [id number] DOB: [birth year] Today's Date: 05/30/2021   History of Present Illness 70 y.o. male was brought to hosp to admit 10/18 for hypoxia, weakness, SOB and noted sepsis from PNA, as well as NSTEMI.  Troponins are high from demand ischemia, EF 40-45%, and has recent asp PNA per chart.  PMHx:  R lower lobe stage IV adenocarcinoma of the lung, chronic anemia, anxiety, depression, presence of Port-A-Cath, cad s/p PCI with stent placement in 2005, tobacco abuse quit in 2005    PT Comments    Patient received in bed, he is feeling okay, reports he had pain medicine about an hour ago. Agreeable to PT session. He needed min assist for supine to sit. Needs min assist for sit to stand and ambulated a few steps from bed to recliner. O2 sats ranging from 90-85%.  Patient is limited by O2 saturations and sob. He will continue to benefit from skilled PT while here to improve activity tolerance and strength.      Recommendations for follow up therapy are one component of a multi-disciplinary discharge planning process, led by the attending physician.  Recommendations may be updated based on patient status, additional functional criteria and insurance authorization.  Follow Up Recommendations  Skilled nursing-short term rehab (<3 hours/day)     Assistance Recommended at Discharge Intermittent Supervision/Assistance  Equipment Recommendations  None recommended by PT    Recommendations for Other Services       Precautions / Restrictions Precautions Precautions: Fall Precaution Comments: monitor sats and HR; mod fall risk Restrictions Weight Bearing Restrictions: No     Mobility  Bed Mobility Overal bed mobility: Needs Assistance Bed Mobility: Supine to Sit     Supine to sit: Min assist     General bed mobility comments: reached out for assistance with supine to sit.    Transfers Overall transfer level: Needs  assistance Equipment used: 1 person hand held assist Transfers: Sit to/from Stand Sit to Stand: Min guard           General transfer comment: patient requires rest breaks between activities to monitor O2 saturations.    Ambulation/Gait Ambulation/Gait assistance: Min guard Gait Distance (Feet): 3 Feet Assistive device: 1 person hand held assist Gait Pattern/deviations: Step-to pattern;Decreased step length - right;Decreased step length - left;Narrow base of support       General Gait Details: Patient ambulated a few feet to recliner. mild unsteadiness. O2 sats limit activity. Dropping to 85% during transfer. Patient with sob.   Stairs             Wheelchair Mobility    Modified Rankin (Stroke Patients Only)       Balance Overall balance assessment: Needs assistance Sitting-balance support: Feet supported;Bilateral upper extremity supported Sitting balance-Leahy Scale: Good     Standing balance support: No upper extremity supported;During functional activity Standing balance-Leahy Scale: Fair Standing balance comment: fair balance with taking steps to recliner, needs min guard support                            Cognition Arousal/Alertness: Awake/alert Behavior During Therapy: WFL for tasks assessed/performed Overall Cognitive Status: Within Functional Limits for tasks assessed  Exercises Other Exercises Other Exercises: encouraged patient when he can to perform LAQ and marching while seated in recliner    General Comments        Pertinent Vitals/Pain Pain Assessment: No/denies pain Pain Intervention(s): Monitored during session;Premedicated before session    Home Living                          Prior Function            PT Goals (current goals can now be found in the care plan section) Acute Rehab PT Goals Patient Stated Goal: to go home PT Goal Formulation: With  patient Time For Goal Achievement: 05/25/21 Potential to Achieve Goals: Fair Progress towards PT goals: Not progressing toward goals - comment (continues to be limited by O2 needs/saturations)    Frequency    Min 2X/week      PT Plan Current plan remains appropriate    Co-evaluation              AM-PAC PT "6 Clicks" Mobility   Outcome Measure  Help needed turning from your back to your side while in a flat bed without using bedrails?: None Help needed moving from lying on your back to sitting on the side of a flat bed without using bedrails?: A Little Help needed moving to and from a bed to a chair (including a wheelchair)?: A Little Help needed standing up from a chair using your arms (e.g., wheelchair or bedside chair)?: A Little Help needed to walk in hospital room?: A Lot Help needed climbing 3-5 steps with a railing? : Total 6 Click Score: 16    End of Session Equipment Utilized During Treatment: Oxygen Activity Tolerance: Treatment limited secondary to medical complications (Comment) (pulmonary limitations) Patient left: in chair;with call bell/phone within reach;with chair alarm set Nurse Communication: Mobility status PT Visit Diagnosis: Muscle weakness (generalized) (M62.81);Difficulty in walking, not elsewhere classified (R26.2);Unsteadiness on feet (R26.81)     Time: 6945-0388 PT Time Calculation (min) (ACUTE ONLY): 10 min  Charges:  $Therapeutic Activity: 8-22 mins                     Motty Borin, PT, GCS 05/30/21,11:41 AM

## 2021-05-30 NOTE — Progress Notes (Signed)
PROGRESS NOTE    Jimmy West  YCX:[id number] DOB: 1951-02-15 DOA: 05/07/2021 PCP: Pcp, No  [id number]/[id number]-[id number]   Assessment & Plan:   Active Problems:   Primary cancer of right lower lobe of lung (HCC)   Acute hypoxemic respiratory failure (HCC)   SOB (shortness of breath)   Severe protein-calorie malnutrition (HCC)   Non-ST elevation (NSTEMI) myocardial infarction [facility])   70 year old M with PMH of stage IV RLL lung cancer, cancer pain, CAD/stent in 2005, chronic respiratory failure with hypoxia on 2 L, anemia of chronic disease, anxiety, depression and debility presenting with generalized weakness, shortness of breath and dry cough, and admitted for acute on chronic hypoxic respiratory failure in the setting of LLL pneumonia and non-STEMI.  Started on broad-spectrum antibiotics, steroid and IV heparin.  Cardiology consulted recommended medical management and [place] outpatient.  CTA chest negative for PE.   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 DDX: Steroid refractory ICI toxicity (grade 3), lymphangitic spread of CA (not entirely ruled out) - Procalcitonin's have been negative, KL6 >> 1,866 (reference 0-500) - Fungitell negative - Received infliximab 5 mg/kg x 1, 11/02, repeat in 2- 3 weeks x1 - 7L O2 requirement currently Plan: --Continue supplemental O2 to keep sats between 88-92%, wean as tolerated - Continue Brovana with Pulmicort twice a day via neb - Continue Yupelri once daily via neb - Continue prednisone at 30 mg daily (started 11/2), taper as O2 requirements decrease - PJP prophylaxis Bactrim DS Monday Wednesday Friday - Will need taper of steroids over 4 to 6-week period of time  Pneumonia ruled out Procalcitonin is normal Persistent findings on CT with differential as above -No need for  antibiotics at present except for PJP prophylaxis -Aspiration precautions   ACUTE KIDNEY INJURY, resolved CKD 2, 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  Now back to baseline ~1. --Ensure oral hydration   Anemia, chronic, secondary to cancer treatment - stable - Monitor routine CBC   Elevated d-dimer  History PE on Eliquis - CTA Chest 10/18 negative for PE - High risk for PE recurrence due to underlying malignancy --cont Eliquis   Sepsis, ruled out --Pulm ruled out pneumonia, so no source of infection to qualify for sepsis.   NSTEMI history of CAD/stent in 2005: Troponin 3153 -> 3506 -> 2927 -> 2894.  EKG with T wave changes in lateral leads but no dynamic changes.  TTE with LVEF of 55 to 60%, G1-DD and no RWMA chest pain-free.  -Completed 48 hours of IV heparin and transition back to home Eliquis. -Medical management due to poor candidacy for heart cath in the setting of respiratory failure -Cardiology recommends outpatient follow-up when stable from respiratory standpoint.   Chronic diastolic CHF:  TTE as above.  Appears euvolemic on exam.  Does not seem to be on diuretics at home.    Hypotension: Resolved.   Stage IV lung cancer s/p radiation and chemotherapy previously.   --outpatient oncology followup   Anemia of chronic disease:  H&H stable around 9's, after 1 unit --monitor Hgb   Chronic pain syndrome: -Continue oxycodone and bowel regimen as needed.   Depression and Anxiety: Stable -cont Fluoxetine and olanzapine   Physical deconditioning -uses rolling walker at baseline. -Therapy recommends SNF-cannot go until oxygen requirement does not go above 6L even with exertion. --pt encouraged to participate with PT/OT session when offered  Leukocytosis/bandemia -likely from steroid.   Goal of care counseling -patient with comorbidity as above.  Very frail and deconditioned.  Poor long-term prognosis.  Appropriately DNR/DNI.  A candidate for hospice -Palliative  medicine consulted.   DVT prophylaxis: DP:OEUMPNT Code Status: DNR  Family Communication:  Level of care: Progressive Cardiac Dispo:   The patient is from: home Anticipated d/c is to: SNF Anticipated d/c date is: undetermined Patient currently is not medically ready to d/c due to: unstable respiratory status and can not wean down from 7L O2.   Subjective and Interval History:  Pt reported more cough today.  Said that he felt more depressed due to the whether.  Pt encouraged to work with PT/OT whenever offered.   Objective: Vitals:   05/30/21 0748 05/30/21 0812 05/30/21 1124 05/30/21 1436  BP: 98/71  106/64 109/74  Pulse: 73  98 95  Resp: 19  18 19   Temp: 98.2 F (36.8 C)  98 F (36.7 C) 97.7 F (36.5 C)  TempSrc:      SpO2: 99% 90% 91% 96%  Weight:      Height:        Intake/Output Summary (Last 24 hours) at 05/30/2021 1620 Last data filed at 05/30/2021 1330 Gross per 24 hour  Intake 960 ml  Output 800 ml  Net 160 ml   Filed Weights   05/10/21 0901 05/10/21 1537 05/29/21 0343  Weight: 63.2 kg 64 kg 60.1 kg    Examination:   Constitutional: NAD, AAOx3 HEENT: conjunctivae and lids normal, EOMI CV: No cyanosis.   RESP: normal respiratory effort at rest, on 7L Extremities: No effusions, edema in BLE SKIN: warm, dry Neuro: II - XII grossly intact.   Psych: depressed mood and affect.  Appropriate judgement and reason   Data Reviewed: I have personally reviewed following labs and imaging studies  CBC: Recent Labs  Lab 05/25/21 0620 05/29/21 0510 05/30/21 0531  WBC 10.4 9.3 10.3  HGB 9.8* 9.7* 10.0*  HCT 30.7* 31.0* 31.7*  MCV 97.2 97.2 96.4  PLT 161 170 614   Basic Metabolic Panel: Recent Labs  Lab 05/29/21 0510 05/30/21 0531  NA 139 138  K 4.6 4.3  CL 99 99  CO2 34* 34*  GLUCOSE 89 97  BUN 30* 30*  CREATININE 1.06 1.09  CALCIUM 9.0 8.7*  MG  --  1.9  PHOS 4.5  --    GFR: Estimated Creatinine Clearance: 53.6 mL/min (by C-G formula  based on SCr of 1.09 mg/dL). Liver Function Tests: Recent Labs  Lab 05/29/21 0510  ALBUMIN 2.6*   No results for input(s): LIPASE, AMYLASE in the last 168 hours. No results for input(s): AMMONIA in the last 168 hours. Coagulation Profile: No results for input(s): INR, PROTIME in the last 168 hours. Cardiac Enzymes: No results for input(s): CKTOTAL, CKMB, CKMBINDEX, TROPONINI in the last 168 hours. BNP (last 3 results) No results for input(s): PROBNP in the last 8760 hours. HbA1C: No results for input(s): HGBA1C in the last 72 hours. CBG: No results for input(s): GLUCAP in the last 168 hours. Lipid Profile: No results for input(s): CHOL, HDL, LDLCALC, TRIG, CHOLHDL, LDLDIRECT in the last 72 hours. Thyroid Function Tests: No results for input(s): TSH, T4TOTAL, FREET4, T3FREE, THYROIDAB in the last 72 hours. Anemia Panel: No results for input(s): VITAMINB12, FOLATE, FERRITIN, TIBC, IRON, RETICCTPCT in the last 72 hours. Sepsis Labs: No results for input(s): PROCALCITON, LATICACIDVEN in the last 168 hours.  No results found for this or any previous  visit (from the past 240 hour(s)).    Radiology Studies: CT CHEST WO CONTRAST  Result Date: 05/29/2021 CLINICAL DATA:  70 year old male with history of acute interstitial pneumonitis. Shortness of breath. History of lung cancer. EXAM: CT CHEST WITHOUT CONTRAST TECHNIQUE: Multidetector CT imaging of the chest was performed following the standard protocol without IV contrast. COMPARISON:  Chest CT 05/15/2021. FINDINGS: Cardiovascular: Heart size is enlarged with left ventricular dilatation, most notably in the apex where there appears to be mild aneurysmal dilatation, as well as areas of dystrophic calcification, presumably from prior distal LAD territory myocardial infarction(s). There is no significant pericardial fluid, thickening or pericardial calcification. There is aortic atherosclerosis, as well as atherosclerosis of the great vessels  of the mediastinum and the coronary arteries, including calcified atherosclerotic plaque in the left main, left anterior descending and right coronary arteries. Right internal jugular single-lumen porta cath with tip terminating in the right atrium. Mediastinum/Nodes: No pathologically enlarged mediastinal or hilar lymph nodes. Please note that accurate exclusion of hilar adenopathy is limited on noncontrast CT scans. Esophagus is unremarkable in appearance. No axillary lymphadenopathy. Lungs/Pleura: Widespread but patchy areas of airspace consolidation, ground-glass attenuation, septal thickening, thickening of the peribronchovascular interstitium, cylindrical and varicose bronchiectasis, and regional areas of architectural distortion are again noted throughout the lungs bilaterally. Overall, aeration appears slightly worsened, particularly in the lower lobes of the lungs bilaterally. Treated right lower lobe neoplasm is obscured by the evolving parenchymal process in the lungs. There is also diffuse bronchial wall thickening with moderate centrilobular and paraseptal emphysema. Trace right pleural effusion, unchanged. No definite left pleural effusion Upper Abdomen: Aortic atherosclerosis. Exophytic low-attenuation lesion in the upper pole the right kidney measuring 2.1 cm in diameter, incompletely characterized on today's non-contrast CT examination, but similar to the prior study and statistically likely to represent a cyst. Musculoskeletal: Chronic compression fracture of T4 with vertebral plana appearance, similar to the prior examination. There are no aggressive appearing lytic or blastic lesions noted in the visualized portions of the skeleton. IMPRESSION: . 1. Overall, there is been slight worsened aeration in the lower lungs when compared to the recent prior examination. Findings could be consistent with reported clinical history of acute interstitial pneumonitis. 2. Aortic atherosclerosis, in addition to  left main and 2 vessel coronary artery disease. Assessment for potential risk factor modification, dietary therapy or pharmacologic therapy may be warranted, if clinically indicated. 3. Diffuse bronchial wall thickening with moderate centrilobular and paraseptal emphysema; imaging findings suggestive of underlying COPD. Aortic Atherosclerosis (ICD10-I70.0) and Emphysema (ICD10-J43.9). Electronically Signed   By: Vinnie Langton M.D.   On: 05/29/2021 11:10     Scheduled Meds:  apixaban  5 mg Oral BID   arformoterol  15 mcg Nebulization BID   atorvastatin  40 mg Oral Daily   budesonide (PULMICORT) nebulizer solution  0.5 mg Nebulization BID   Chlorhexidine Gluconate Cloth  6 each Topical Daily   feeding supplement  237 mL Oral TID BM   FLUoxetine  40 mg Oral Daily   guaiFENesin  1,200 mg Oral BID   OLANZapine  10 mg Oral QHS   predniSONE  30 mg Oral Q breakfast   revefenacin  175 mcg Nebulization Daily   sodium chloride flush  10-40 mL Intracatheter Q12H   sulfamethoxazole-trimethoprim  1 tablet Oral Once per day on Mon Wed Fri   Continuous Infusions:  sodium chloride 10 mL/hr (05/13/21 1620)     LOS: 23 days     Enzo Bi, MD [facility] If 7PM-7AM, please contact night-coverage 05/30/2021, 4:20 PM

## 2021-05-30 NOTE — Addendum Note (Signed)
Addended by: Alvis Lemmings C on: 05/30/2021 02:15 PM   Modules accepted: Orders

## 2021-05-30 NOTE — Telephone Encounter (Signed)
Kate Sable, MD  Sent: Thu May 30, 2021  1:51 PM  To: Emily Filbert, RN; Picard-Tagnolli, Coleen, RN  Cc: P [facility] Scheduling; [facility] Triage          Message  No invasive work-up or Myoview needs to be ordered until patient is evaluated in the office post hospital discharge.  Cancel any Myoview orders if present.  Thank you  ----- Message -----  From: Emily Filbert, RN  Sent: 05/30/2021  11:45 AM EST  To: Kate Sable, MD, [facility] Triage, *    Pending myoview order cancelled.  Will forward to scheduling team to assist in monitoring patient discharge status.  Will need in office follow up post discharge.

## 2021-05-30 NOTE — Telephone Encounter (Signed)
Reviewed the patient's chart. He is still currently admitted (since 05/07/21). He had a Hospice/ Palliative Care consult on 05/14/21. He is being followed by pulmonary (Dr. Patsey Berthold) while inpatient.  He is currently a DNR/DNI.  He was seen in consultation by Dr. Garen Lah on 05/07/21 as an inpatient.  Will forward to Dr. Garen Lah to address if we need to continue to monitor the patient's discharge and schedule myoview testing to be done, or would an in office visit be warranted if he is discharged and stabilizes.  Cardiology has not seen the patient in the hospital since 05/12/21.  To primary cardiologist to review/ advise at this time.

## 2021-05-31 DIAGNOSIS — J9601 Acute respiratory failure with hypoxia: Secondary | ICD-10-CM | POA: Diagnosis not present

## 2021-05-31 LAB — CBC
HCT: 30.4 % — ABNORMAL LOW (ref 39.0–52.0)
Hemoglobin: 9.4 g/dL — ABNORMAL LOW (ref 13.0–17.0)
MCH: 30.1 pg (ref 26.0–34.0)
MCHC: 30.9 g/dL (ref 30.0–36.0)
MCV: 97.4 fL (ref 80.0–100.0)
Platelets: 197 10*3/uL (ref 150–400)
RBC: 3.12 MIL/uL — ABNORMAL LOW (ref 4.22–5.81)
RDW: 16.7 % — ABNORMAL HIGH (ref 11.5–15.5)
WBC: 10.7 10*3/uL — ABNORMAL HIGH (ref 4.0–10.5)
nRBC: 0 % (ref 0.0–0.2)

## 2021-05-31 LAB — BASIC METABOLIC PANEL
Anion gap: 5 (ref 5–15)
BUN: 31 mg/dL — ABNORMAL HIGH (ref 8–23)
CO2: 34 mmol/L — ABNORMAL HIGH (ref 22–32)
Calcium: 8.7 mg/dL — ABNORMAL LOW (ref 8.9–10.3)
Chloride: 101 mmol/L (ref 98–111)
Creatinine, Ser: 1.11 mg/dL (ref 0.61–1.24)
GFR, Estimated: >60 mL/min (ref >60)
Glucose, Bld: 96 mg/dL (ref 70–99)
Potassium: 4.2 mmol/L (ref 3.5–5.1)
Sodium: 140 mmol/L (ref 135–145)

## 2021-05-31 LAB — MAGNESIUM: Magnesium: 2.1 mg/dL (ref 1.7–2.4)

## 2021-05-31 MED ORDER — ALPRAZOLAM 0.5 MG PO TABS
1.0000 mg | ORAL_TABLET | Freq: Four times a day (QID) | ORAL | Status: DC | PRN
  Administered 2021-05-31 – 2021-06-01 (×3): 1 mg via ORAL
  Filled 2021-05-31 (×3): qty 2

## 2021-05-31 NOTE — Plan of Care (Signed)
Problem: Health Behavior/Discharge Planning: Goal: Ability to manage health-related needs will improve 05/31/2021 1650 by Cristela Blue, RN Outcome: Progressing 05/31/2021 1650 by Cristela Blue, RN Outcome: Progressing   Problem: Clinical Measurements: Goal: Ability to maintain clinical measurements within normal limits will improve 05/31/2021 1650 by Cristela Blue, RN Outcome: Progressing 05/31/2021 1650 by Cristela Blue, RN Outcome: Progressing Goal: Will remain free from infection 05/31/2021 1650 by Cristela Blue, RN Outcome: Progressing 05/31/2021 1650 by Cristela Blue, RN Outcome: Progressing Goal: Diagnostic test results will improve 05/31/2021 1650 by Cristela Blue, RN Outcome: Progressing 05/31/2021 1650 by Cristela Blue, RN Outcome: Progressing Goal: Respiratory complications will improve 05/31/2021 1650 by Cristela Blue, RN Outcome: Progressing 05/31/2021 1650 by Cristela Blue, RN Outcome: Progressing Goal: Cardiovascular complication will be avoided 05/31/2021 1650 by Cristela Blue, RN Outcome: Progressing 05/31/2021 1650 by Cristela Blue, RN Outcome: Progressing   Problem: Activity: Goal: Risk for activity intolerance will decrease 05/31/2021 1650 by Cristela Blue, RN Outcome: Progressing 05/31/2021 1650 by Cristela Blue, RN Outcome: Progressing   Problem: Nutrition: Goal: Adequate nutrition will be maintained 05/31/2021 1650 by Cristela Blue, RN Outcome: Progressing 05/31/2021 1650 by Cristela Blue, RN Outcome: Progressing   Problem: Coping: Goal: Level of anxiety will decrease 05/31/2021 1650 by Cristela Blue, RN Outcome: Progressing 05/31/2021 1650 by Cristela Blue, RN Outcome: Progressing   Problem: Elimination: Goal: Will not experience complications related to bowel motility 05/31/2021 1650 by Cristela Blue, RN Outcome: Progressing 05/31/2021 1650 by Cristela Blue, RN Outcome: Progressing Goal: Will not experience  complications related to urinary retention 05/31/2021 1650 by Cristela Blue, RN Outcome: Progressing 05/31/2021 1650 by Cristela Blue, RN Outcome: Progressing   Problem: Pain Managment: Goal: General experience of comfort will improve 05/31/2021 1650 by Cristela Blue, RN Outcome: Progressing 05/31/2021 1650 by Cristela Blue, RN Outcome: Progressing   Problem: Safety: Goal: Ability to remain free from injury will improve 05/31/2021 1650 by Cristela Blue, RN Outcome: Progressing 05/31/2021 1650 by Cristela Blue, RN Outcome: Progressing   Problem: Skin Integrity: Goal: Risk for impaired skin integrity will decrease 05/31/2021 1650 by Cristela Blue, RN Outcome: Progressing 05/31/2021 1650 by Cristela Blue, RN Outcome: Progressing   Problem: Activity: Goal: Ability to tolerate increased activity will improve 05/31/2021 1650 by Cristela Blue, RN Outcome: Progressing 05/31/2021 1650 by Cristela Blue, RN Outcome: Progressing   Problem: Clinical Measurements: Goal: Ability to maintain a body temperature in the normal range will improve 05/31/2021 1650 by Cristela Blue, RN Outcome: Progressing 05/31/2021 1650 by Cristela Blue, RN Outcome: Progressing   Problem: Respiratory: Goal: Ability to maintain adequate ventilation will improve 05/31/2021 1650 by Cristela Blue, RN Outcome: Progressing 05/31/2021 1650 by Cristela Blue, RN Outcome: Progressing Goal: Ability to maintain a clear airway will improve 05/31/2021 1650 by Cristela Blue, RN Outcome: Progressing 05/31/2021 1650 by Cristela Blue, RN Outcome: Progressing   Problem: Education: Goal: Understanding of cardiac disease, CV risk reduction, and recovery process will improve 05/31/2021 1650 by Cristela Blue, RN Outcome: Progressing 05/31/2021 1650 by Cristela Blue, RN Outcome: Progressing Goal: Individualized Educational Video(s) 05/31/2021 1650 by Cristela Blue, RN Outcome:  Progressing 05/31/2021 1650 by Cristela Blue, RN Outcome: Progressing   Problem: Activity: Goal: Ability to tolerate increased activity will improve 05/31/2021 1650 by Cristela Blue, RN Outcome: Progressing 05/31/2021 1650 by Cristela Blue, RN Outcome: Progressing   Problem: Cardiac: Goal: Ability to achieve and maintain adequate cardiovascular perfusion will improve 05/31/2021 1650 by Cristela Blue, RN Outcome: Progressing 05/31/2021 1650 by Cristela Blue, RN Outcome: Progressing   Problem: Health Behavior/Discharge  Planning: Goal: Ability to safely manage health-related needs after discharge will improve 05/31/2021 1650 by Cristela Blue, RN Outcome: Progressing 05/31/2021 1650 by Cristela Blue, RN Outcome: Progressing

## 2021-05-31 NOTE — Progress Notes (Signed)
PROGRESS NOTE    Jimmy West  LZJ:[id number] DOB: 15-Nov-1950 DOA: 05/07/2021 PCP: Pcp, No  253A/253A-AA   Assessment & Plan:   Active Problems:   Primary cancer of right lower lobe of lung (HCC)   Acute hypoxemic respiratory failure (HCC)   SOB (shortness of breath)   Severe protein-calorie malnutrition (HCC)   Non-ST elevation (NSTEMI) myocardial infarction [facility])   70 year old M with PMH of stage IV RLL lung cancer, cancer pain, CAD/stent in 2005, chronic respiratory failure with hypoxia on 2 L, anemia of chronic disease, anxiety, depression and debility presenting with generalized weakness, shortness of breath and dry cough, and admitted for acute on chronic hypoxic respiratory failure in the setting of LLL pneumonia and non-STEMI.  Started on broad-spectrum antibiotics, steroid and IV heparin.  Cardiology consulted recommended medical management and [place] outpatient.  CTA chest negative for PE.   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 DDX: Steroid refractory ICI toxicity (grade 3), lymphangitic spread of CA (not entirely ruled out) - Procalcitonin's have been negative, KL6 >> 1,866 (reference 0-500) - Fungitell negative - Received infliximab 5 mg/kg x 1, 11/02, repeat in 2- 3 weeks x1 - 5L O2 requirement currently at rest, desat with exertion Plan: --Continue supplemental O2 to keep sats between 88-92%, wean as tolerated - Continue Brovana with Pulmicort twice a day via neb - Continue Yupelri once daily via neb - Continue prednisone at 30 mg daily (started 11/2), taper as O2 requirements decrease - PJP prophylaxis Bactrim DS Monday Wednesday Friday - Will need taper of steroids over 4 to 6-week period of time  Pneumonia ruled out Procalcitonin is normal Persistent findings on CT with  differential as above -No need for antibiotics at present except for PJP prophylaxis   ACUTE KIDNEY INJURY, resolved CKD 2, 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  Now back to baseline ~1. --Ensure oral hydration   Anemia, chronic, secondary to cancer treatment - stable --monitor Hgb   Elevated d-dimer  History PE on Eliquis - CTA Chest 10/18 negative for PE - High risk for PE recurrence due to underlying malignancy --cont Eliquis   Sepsis, ruled out --Pulm ruled out pneumonia, so no source of infection to qualify for sepsis.   NSTEMI history of CAD/stent in 2005: Troponin 3153 -> 3506 -> 2927 -> 2894.  EKG with T wave changes in lateral leads but no dynamic changes.  TTE with LVEF of 55 to 60%, G1-DD and no RWMA chest pain-free.  -Completed 48 hours of IV heparin and transition back to home Eliquis. -Medical management due to poor candidacy for heart cath in the setting of respiratory failure -Cardiology recommends outpatient follow-up when stable from respiratory standpoint.   Chronic diastolic CHF:  TTE as above.  Appears euvolemic on exam.  Does not seem to be on diuretics at home.    Hypotension: Resolved.   Stage IV lung cancer s/p radiation and chemotherapy previously.   --Dr. Rogue Bussing will see pt on Monday   Anemia of chronic disease:  H&H stable around 9's, after 1 unit --monitor Hgb   Chronic pain syndrome: --cont home oxycodone PRN   Depression and Anxiety: Stable -cont Fluoxetine and olanzapine --cont home Xanax TID PRN   Physical deconditioning -uses rolling walker at baseline. -Therapy recommends SNF-cannot go until oxygen requirement does not go above 6L even with exertion. --pt encouraged to  participate with PT/OT session when offered   Leukocytosis/bandemia -likely from steroid.   Goal of care counseling -patient with comorbidity as above.  Very frail and deconditioned.  Poor long-term prognosis.  Appropriately DNR/DNI.  A candidate for  hospice -Palliative medicine consulted.   DVT prophylaxis: NU:[id number] Code Status: DNR  Family Communication:  Level of care: Med-Surg Dispo:   The patient is from: home Anticipated d/c is to: SNF Anticipated d/c date is: undetermined Patient currently is not medically ready to d/c due to: needs >5L with exertion   Subjective and Interval History:  Pt was complaining of not getting his PRN Xanax when he wanted.  Pt asked to get up to go to the bathroom.  Normal oral intake.     Objective: Vitals:   05/31/21 0807 05/31/21 1154 05/31/21 1441 05/31/21 1540  BP: 119/81 103/69 120/82 123/81  Pulse: 91 99 (!) 103 93  Resp:    20  Temp: (!) 97.5 F (36.4 C) 97.6 F (36.4 C) (!) 97.5 F (36.4 C) 98 F (36.7 C)  TempSrc:      SpO2: 93% 94% 94% 95%  Weight:      Height:        Intake/Output Summary (Last 24 hours) at 05/31/2021 1751 Last data filed at 05/31/2021 1400 Gross per 24 hour  Intake 610 ml  Output 2100 ml  Net -1490 ml   Filed Weights   05/10/21 0901 05/10/21 1537 05/29/21 0343  Weight: 63.2 kg 64 kg 60.1 kg    Examination:   Constitutional: NAD, AAOx3 HEENT: conjunctivae and lids normal, EOMI CV: No cyanosis.   RESP: normal respiratory effort, on 5L Extremities: No effusions, edema in BLE SKIN: warm, dry Neuro: II - XII grossly intact.   Psych: grouchy mood and affect.  Appropriate judgement and reason   Data Reviewed: I have personally reviewed following labs and imaging studies  CBC: Recent Labs  Lab 05/25/21 0620 05/29/21 0510 05/30/21 0531 05/31/21 0600  WBC 10.4 9.3 10.3 10.7*  HGB 9.8* 9.7* 10.0* 9.4*  HCT 30.7* 31.0* 31.7* 30.4*  MCV 97.2 97.2 96.4 97.4  PLT 161 170 204 440   Basic Metabolic Panel: Recent Labs  Lab 05/29/21 0510 05/30/21 0531 05/31/21 0600  NA 139 138 140  K 4.6 4.3 4.2  CL 99 99 101  CO2 34* 34* 34*  GLUCOSE 89 97 96  BUN 30* 30* 31*  CREATININE 1.06 1.09 1.11  CALCIUM 9.0 8.7* 8.7*  MG  --  1.9 2.1   PHOS 4.5  --   --    GFR: Estimated Creatinine Clearance: 52.6 mL/min (by C-G formula based on SCr of 1.11 mg/dL). Liver Function Tests: Recent Labs  Lab 05/29/21 0510  ALBUMIN 2.6*   No results for input(s): LIPASE, AMYLASE in the last 168 hours. No results for input(s): AMMONIA in the last 168 hours. Coagulation Profile: No results for input(s): INR, PROTIME in the last 168 hours. Cardiac Enzymes: No results for input(s): CKTOTAL, CKMB, CKMBINDEX, TROPONINI in the last 168 hours. BNP (last 3 results) No results for input(s): PROBNP in the last 8760 hours. HbA1C: No results for input(s): HGBA1C in the last 72 hours. CBG: No results for input(s): GLUCAP in the last 168 hours. Lipid Profile: No results for input(s): CHOL, HDL, LDLCALC, TRIG, CHOLHDL, LDLDIRECT in the last 72 hours. Thyroid Function Tests: No results for input(s): TSH, T4TOTAL, FREET4, T3FREE, THYROIDAB in the last 72 hours. Anemia Panel: No results for input(s): VITAMINB12, FOLATE, FERRITIN, TIBC,  IRON, RETICCTPCT in the last 72 hours. Sepsis Labs: No results for input(s): PROCALCITON, LATICACIDVEN in the last 168 hours.  No results found for this or any previous visit (from the past 240 hour(s)).    Radiology Studies: No results found.   Scheduled Meds:  apixaban  5 mg Oral BID   arformoterol  15 mcg Nebulization BID   atorvastatin  40 mg Oral Daily   budesonide (PULMICORT) nebulizer solution  0.5 mg Nebulization BID   Chlorhexidine Gluconate Cloth  6 each Topical Daily   feeding supplement  237 mL Oral TID BM   FLUoxetine  40 mg Oral Daily   guaiFENesin  1,200 mg Oral BID   OLANZapine  10 mg Oral QHS   predniSONE  30 mg Oral Q breakfast   revefenacin  175 mcg Nebulization Daily   sodium chloride flush  10-40 mL Intracatheter Q12H   sulfamethoxazole-trimethoprim  1 tablet Oral Once per day on Mon Wed Fri   Continuous Infusions:  sodium chloride 10 mL/hr (05/13/21 1620)     LOS: 24 days      Enzo Bi, MD [facility] If 7PM-7AM, please contact night-coverage 05/31/2021, 5:51 PM

## 2021-05-31 NOTE — Progress Notes (Signed)
Occupational Therapy Treatment Patient Details Name: Jimmy West MRN: [id number] DOB: 02-01-1951 Today's Date: 05/31/2021   History of present illness 70 y.o. male was brought to hosp to admit 10/18 for hypoxia, weakness, SOB and noted sepsis from PNA, as well as NSTEMI.  Troponins are high from demand ischemia, EF 40-45%, and has recent asp PNA per chart.  PMHx:  R lower lobe stage IV adenocarcinoma of the lung, chronic anemia, anxiety, depression, presence of Port-A-Cath, cad s/p PCI with stent placement in 2005, tobacco abuse quit in 2005   OT comments  Jimmy West seen for OT treatment on this date. Upon arrival to room pt awake/alert. Pt in bed and agreeable to tx. Pt SUP for ADL t/f, pt limited 2/2 fatigue. Pt performed ther-ex w/ yellow theraband- chestpulls x10, shoulder punch x10 on R side (unable to complete on L side 2/2 fatigue). Pt required significant recovery time between exercise sets. SpO2 remaining in high 80s while sitting, 70's upon standing, resolved with time, returning to bed- RN in room to assess. Pt making good progress toward goals. Pt continues to benefit from skilled OT services to maximize return to PLOF and minimize risk of future falls, injury, caregiver burden, and readmission. Will continue to follow POC. Discharge recommendation remains appropriate.     Recommendations for follow up therapy are one component of a multi-disciplinary discharge planning process, led by the attending physician.  Recommendations may be updated based on patient status, additional functional criteria and insurance authorization.    Follow Up Recommendations  Skilled nursing-short term rehab (<3 hours/day)    Assistance Recommended at Discharge Intermittent Supervision/Assistance  Equipment Recommendations  Other (comment) (defer to next venue of care)    Recommendations for Other Services      Precautions / Restrictions Precautions Precautions: Fall Precaution Comments:  monitor sats and HR; mod fall risk Restrictions Weight Bearing Restrictions: No       Mobility Bed Mobility Overal bed mobility: Needs Assistance Bed Mobility: Supine to Sit;Sit to Supine     Supine to sit: Supervision;HOB elevated Sit to supine: Supervision        Transfers Overall transfer level: Needs assistance   Transfers: Sit to/from Stand Sit to Stand: Supervision                 Balance Overall balance assessment: Needs assistance Sitting-balance support: Feet supported;No upper extremity supported Sitting balance-Leahy Scale: Good     Standing balance support: No upper extremity supported Standing balance-Leahy Scale: Fair                             ADL either performed or assessed with clinical judgement   ADL Overall ADL's : Needs assistance/impaired                                       General ADL Comments: SUP for ADL t/f, pt limitied 2/2 SOB/fatigue.    Extremity/Trunk Assessment              Vision       Perception     Praxis      Cognition Arousal/Alertness: Awake/alert Behavior During Therapy: WFL for tasks assessed/performed Overall Cognitive Status: Within Functional Limits for tasks assessed  Exercises Exercises: Other exercises Other Exercises Other Exercises: Pt educ re: OT role, importance of mvmt for functional mobility, ECS techniques Other Exercises: sup<>sit, ther-ex, sit<>stand, steps along EOB   Shoulder Instructions       General Comments SpO2 remaining in high 80s while sitting, 70's upon standing, resolved with time, returning to bed.    Pertinent Vitals/ Pain       Pain Assessment: No/denies pain  Home Living                                          Prior Functioning/Environment              Frequency  Min 2X/week        Progress Toward Goals  OT Goals(current goals can now be  found in the care plan section)  Progress towards OT goals: Progressing toward goals  Acute Rehab OT Goals OT Goal Formulation: With patient Time For Goal Achievement: 06/06/21 Potential to Achieve Goals: Good ADL Goals Pt Will Perform Grooming: with modified independence;standing Pt Will Perform Lower Body Dressing: with modified independence;sit to/from stand Pt Will Transfer to Toilet: regular height toilet;with modified independence;ambulating  Plan Discharge plan remains appropriate;Frequency remains appropriate    Co-evaluation                 AM-PAC OT "6 Clicks" Daily Activity     Outcome Measure   Help from another person eating meals?: None Help from another person taking care of personal grooming?: A Little Help from another person toileting, which includes using toliet, bedpan, or urinal?: A Little Help from another person bathing (including washing, rinsing, drying)?: A Little Help from another person to put on and taking off regular upper body clothing?: A Little Help from another person to put on and taking off regular lower body clothing?: A Little 6 Click Score: 19    End of Session Equipment Utilized During Treatment: Oxygen  OT Visit Diagnosis: Other abnormalities of gait and mobility (R26.89);Muscle weakness (generalized) (M62.81);Unsteadiness on feet (R26.81)   Activity Tolerance Patient tolerated treatment well;Patient limited by fatigue   Patient Left in bed;with call bell/phone within reach;with nursing/sitter in room   Nurse Communication          Time: 0086-7619 OT Time Calculation (min): 18 min  Charges: OT General Charges $OT Visit: 1 Visit OT Treatments $Therapeutic Exercise: 8-22 mins  Jimmy West, Jimmy West 05/31/2021, 12:20 PM

## 2021-06-01 DIAGNOSIS — J9601 Acute respiratory failure with hypoxia: Secondary | ICD-10-CM | POA: Diagnosis not present

## 2021-06-01 LAB — CBC
HCT: 32.4 % — ABNORMAL LOW (ref 39.0–52.0)
Hemoglobin: 10 g/dL — ABNORMAL LOW (ref 13.0–17.0)
MCH: 30.3 pg (ref 26.0–34.0)
MCHC: 30.9 g/dL (ref 30.0–36.0)
MCV: 98.2 fL (ref 80.0–100.0)
Platelets: 215 10*3/uL (ref 150–400)
RBC: 3.3 MIL/uL — ABNORMAL LOW (ref 4.22–5.81)
RDW: 16.8 % — ABNORMAL HIGH (ref 11.5–15.5)
WBC: 13.1 10*3/uL — ABNORMAL HIGH (ref 4.0–10.5)
nRBC: 0 % (ref 0.0–0.2)

## 2021-06-01 LAB — BASIC METABOLIC PANEL
Anion gap: 6 (ref 5–15)
BUN: 30 mg/dL — ABNORMAL HIGH (ref 8–23)
CO2: 32 mmol/L (ref 22–32)
Calcium: 8.7 mg/dL — ABNORMAL LOW (ref 8.9–10.3)
Chloride: 100 mmol/L (ref 98–111)
Creatinine, Ser: 1.18 mg/dL (ref 0.61–1.24)
GFR, Estimated: >60 mL/min (ref >60)
Glucose, Bld: 177 mg/dL — ABNORMAL HIGH (ref 70–99)
Potassium: 3.7 mmol/L (ref 3.5–5.1)
Sodium: 138 mmol/L (ref 135–145)

## 2021-06-01 LAB — MAGNESIUM: Magnesium: 1.8 mg/dL (ref 1.7–2.4)

## 2021-06-01 MED ORDER — ALPRAZOLAM 0.5 MG PO TABS
1.0000 mg | ORAL_TABLET | ORAL | Status: DC | PRN
  Administered 2021-06-01: 1 mg via ORAL
  Administered 2021-06-02: 0.5 mg via ORAL
  Administered 2021-06-02: 1 mg via ORAL
  Administered 2021-06-02: 0.5 mg via ORAL
  Administered 2021-06-03 – 2021-06-13 (×34): 1 mg via ORAL
  Filled 2021-06-01 (×36): qty 2
  Filled 2021-06-01: qty 4
  Filled 2021-06-01: qty 2

## 2021-06-01 NOTE — Progress Notes (Signed)
PROGRESS NOTE    Jimmy West  DPO:[id number] DOB: February 06, 1951 DOA: 05/07/2021 PCP: Pcp, No  253A/253A-AA   Assessment & Plan:   Active Problems:   Primary cancer of right lower lobe of lung (HCC)   Acute hypoxemic respiratory failure (HCC)   SOB (shortness of breath)   Severe protein-calorie malnutrition (HCC)   Non-ST elevation (NSTEMI) myocardial infarction [facility])   70 year old M with PMH of stage IV RLL lung cancer, cancer pain, CAD/stent in 2005, chronic respiratory failure with hypoxia on 2 L, anemia of chronic disease, anxiety, depression and debility presenting with generalized weakness, shortness of breath and dry cough, and admitted for acute on chronic hypoxic respiratory failure in the setting of LLL pneumonia and non-STEMI.  Started on broad-spectrum antibiotics, steroid and IV heparin.  Cardiology consulted recommended medical management and [place] outpatient.  CTA chest negative for PE.   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 DDX: Steroid refractory ICI toxicity (grade 3), lymphangitic spread of CA (not entirely ruled out) - Procalcitonin's have been negative, KL6 >> 1,866 (reference 0-500) - Fungitell negative - Received infliximab 5 mg/kg x 1, 11/02, repeat in 2- 3 weeks x1 - 5L O2 requirement currently at rest, desat with exertion Plan: --Continue supplemental O2 to keep sats between 88-92%, wean as tolerated - Continue Brovana with Pulmicort twice a day via neb - Continue Yupelri once daily via neb - Continue prednisone at 30 mg daily (started 11/2), taper as O2 requirements decrease - PJP prophylaxis Bactrim DS Monday Wednesday Friday - Will need taper of steroids over 4 to 6-week period of time  Pneumonia ruled out Procalcitonin is normal Persistent findings on CT with  differential as above -No need for antibiotics at present except for PJP prophylaxis   ACUTE KIDNEY INJURY, resolved CKD 2, 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  Now back to baseline ~1. --Ensure oral hydration   Anemia, chronic, secondary to cancer treatment - stable --monitor Hgb   Elevated d-dimer  History PE on Eliquis - CTA Chest 10/18 negative for PE - High risk for PE recurrence due to underlying malignancy --cont Eliquis   Sepsis, ruled out --Pulm ruled out pneumonia, so no source of infection to qualify for sepsis.   NSTEMI history of CAD/stent in 2005: Troponin 3153 -> 3506 -> 2927 -> 2894.  EKG with T wave changes in lateral leads but no dynamic changes.  TTE with LVEF of 55 to 60%, G1-DD and no RWMA chest pain-free.  -Completed 48 hours of IV heparin and transition back to home Eliquis. -Medical management due to poor candidacy for heart cath in the setting of respiratory failure -Cardiology recommends outpatient follow-up when stable from respiratory standpoint.   Chronic diastolic CHF:  TTE as above.  Appears euvolemic on exam.  Does not seem to be on diuretics at home.    Hypotension: Resolved.   Stage IV lung cancer s/p radiation and chemotherapy previously.   --Dr. Rogue Bussing will see pt on Monday   Anemia of chronic disease:  H&H stable around 9's, after 1 unit --monitor Hgb   Chronic pain syndrome: --cont home oxycodone PRN   Depression and Anxiety: Stable -cont Fluoxetine and olanzapine --cont home Xanax, TID PRN, but ordered as q4h PRN so pt can have it at the time intervals that he needs.   Physical deconditioning -uses rolling walker at baseline. -Therapy  recommends SNF-cannot go until oxygen requirement does not go above 6L even with exertion. --pt encouraged to participate with PT/OT session when offered --go to the bathroom      Leukocytosis/bandemia -likely from steroid.   Goal of care counseling -patient with comorbidity as above.  Very  frail and deconditioned.  Poor long-term prognosis.  Appropriately DNR/DNI.  A candidate for hospice -Palliative medicine consulted.   DVT prophylaxis: FY:[id number] Code Status: DNR  Family Communication:  Level of care: Med-Surg Dispo:   The patient is from: home Anticipated d/c is to: SNF Anticipated d/c date is: undetermined Patient currently is not medically ready to d/c due to: needs 10L with exertion   Subjective and Interval History:  RN reported pt desat with a trip to the bathroom and needed up to 10L O2, however, with rest, O2 requirement did decrease back down to 7L.    No new complaints.  Normal oral intake.   Objective: Vitals:   06/01/21 0816 06/01/21 0818 06/01/21 1133 06/01/21 1537  BP: 118/70  113/72 101/65  Pulse: 99 (!) 103 100 96  Resp: 19 18  (!) 21  Temp: 98.1 F (36.7 C)  98.1 F (36.7 C) (!) 97.3 F (36.3 C)  TempSrc:      SpO2: 95% 95% 97% 95%  Weight:      Height:        Intake/Output Summary (Last 24 hours) at 06/01/2021 1655 Last data filed at 06/01/2021 1409 Gross per 24 hour  Intake 960 ml  Output 1350 ml  Net -390 ml   Filed Weights   05/10/21 0901 05/10/21 1537 05/29/21 0343  Weight: 63.2 kg 64 kg 60.1 kg    Examination:   Constitutional: NAD, AAOx3 HEENT: conjunctivae and lids normal, EOMI CV: No cyanosis.   RESP: normal respiratory effort, on 7L Extremities: No effusions, edema in BLE SKIN: warm, dry Neuro: II - XII grossly intact.   Psych: Normal mood and affect.  Appropriate judgement and reason   Data Reviewed: I have personally reviewed following labs and imaging studies  CBC: Recent Labs  Lab 05/29/21 0510 05/30/21 0531 05/31/21 0600 06/01/21 0345  WBC 9.3 10.3 10.7* 13.1*  HGB 9.7* 10.0* 9.4* 10.0*  HCT 31.0* 31.7* 30.4* 32.4*  MCV 97.2 96.4 97.4 98.2  PLT 170 204 197 638   Basic Metabolic Panel: Recent Labs  Lab 05/29/21 0510 05/30/21 0531 05/31/21 0600 06/01/21 0345  NA 139 138 140 138  K 4.6  4.3 4.2 3.7  CL 99 99 101 100  CO2 34* 34* 34* 32  GLUCOSE 89 97 96 177*  BUN 30* 30* 31* 30*  CREATININE 1.06 1.09 1.11 1.18  CALCIUM 9.0 8.7* 8.7* 8.7*  MG  --  1.9 2.1 1.8  PHOS 4.5  --   --   --    GFR: Estimated Creatinine Clearance: 49.5 mL/min (by C-G formula based on SCr of 1.18 mg/dL). Liver Function Tests: Recent Labs  Lab 05/29/21 0510  ALBUMIN 2.6*   No results for input(s): LIPASE, AMYLASE in the last 168 hours. No results for input(s): AMMONIA in the last 168 hours. Coagulation Profile: No results for input(s): INR, PROTIME in the last 168 hours. Cardiac Enzymes: No results for input(s): CKTOTAL, CKMB, CKMBINDEX, TROPONINI in the last 168 hours. BNP (last 3 results) No results for input(s): PROBNP in the last 8760 hours. HbA1C: No results for input(s): HGBA1C in the last 72 hours. CBG: No results for input(s): GLUCAP in the last 168 hours. Lipid  Profile: No results for input(s): CHOL, HDL, LDLCALC, TRIG, CHOLHDL, LDLDIRECT in the last 72 hours. Thyroid Function Tests: No results for input(s): TSH, T4TOTAL, FREET4, T3FREE, THYROIDAB in the last 72 hours. Anemia Panel: No results for input(s): VITAMINB12, FOLATE, FERRITIN, TIBC, IRON, RETICCTPCT in the last 72 hours. Sepsis Labs: No results for input(s): PROCALCITON, LATICACIDVEN in the last 168 hours.  No results found for this or any previous visit (from the past 240 hour(s)).    Radiology Studies: No results found.   Scheduled Meds:  apixaban  5 mg Oral BID   arformoterol  15 mcg Nebulization BID   atorvastatin  40 mg Oral Daily   budesonide (PULMICORT) nebulizer solution  0.5 mg Nebulization BID   Chlorhexidine Gluconate Cloth  6 each Topical Daily   feeding supplement  237 mL Oral TID BM   FLUoxetine  40 mg Oral Daily   guaiFENesin  1,200 mg Oral BID   OLANZapine  10 mg Oral QHS   predniSONE  30 mg Oral Q breakfast   revefenacin  175 mcg Nebulization Daily   sodium chloride flush  10-40 mL  Intracatheter Q12H   sulfamethoxazole-trimethoprim  1 tablet Oral Once per day on Mon Wed Fri   Continuous Infusions:  sodium chloride 10 mL/hr (05/13/21 1620)     LOS: 25 days     Enzo Bi, MD [facility] If 7PM-7AM, please contact night-coverage 06/01/2021, 4:55 PM

## 2021-06-02 DIAGNOSIS — J9601 Acute respiratory failure with hypoxia: Secondary | ICD-10-CM | POA: Diagnosis not present

## 2021-06-02 MED ORDER — GUAIFENESIN 100 MG/5ML PO LIQD
15.0000 mL | Freq: Four times a day (QID) | ORAL | Status: DC
  Administered 2021-06-02 – 2021-06-04 (×2): 15 mL via ORAL
  Filled 2021-06-02 (×13): qty 45

## 2021-06-02 MED ORDER — GUAIFENESIN 100 MG/5ML PO LIQD
15.0000 mL | Freq: Four times a day (QID) | ORAL | Status: DC
  Filled 2021-06-02 (×2): qty 15
  Filled 2021-06-02: qty 30

## 2021-06-02 MED ORDER — GUAIFENESIN 100 MG/5ML PO LIQD
30.0000 mL | Freq: Four times a day (QID) | ORAL | Status: DC
  Filled 2021-06-02 (×7): qty 30

## 2021-06-02 NOTE — Progress Notes (Signed)
Pt up to BR, desats with any movement, down to 79% on 6L. Pt back to bed, labored breathing, o2 increased to 10L to recover. Pt recovered to 91%, o2 decreased back to 6L. Will continue to monitor.

## 2021-06-02 NOTE — Progress Notes (Signed)
PROGRESS NOTE    Jimmy West  GEX:[id number] DOB: [birth year] DOA: 05/07/2021 PCP: Pcp, No  253A/253A-AA   Assessment & Plan:   Active Problems:   Primary cancer of right lower lobe of lung (HCC)   Acute hypoxemic respiratory failure (HCC)   SOB (shortness of breath)   Severe protein-calorie malnutrition (HCC)   Non-ST elevation (NSTEMI) myocardial infarction [facility])   70 year old M with PMH of stage IV RLL lung cancer, cancer pain, CAD/stent in 2005, chronic respiratory failure with hypoxia on 2 L, anemia of chronic disease, anxiety, depression and debility presenting with generalized weakness, shortness of breath and dry cough, and admitted for acute on chronic hypoxic respiratory failure in the setting of LLL pneumonia and non-STEMI.  Started on broad-spectrum antibiotics, steroid and IV heparin.  Cardiology consulted recommended medical management and [place] outpatient.  CTA chest negative for PE.   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 DDX: Steroid refractory ICI toxicity (grade 3), lymphangitic spread of CA (not entirely ruled out) - Procalcitonin's have been negative, KL6 >> 1,866 (reference 0-500) - Fungitell negative - Received infliximab 5 mg/kg x 1, 11/02, repeat in 2- 3 weeks x1 - 5L O2 requirement currently at rest, desat with exertion Plan: --Continue supplemental O2 to keep sats between 88-92%, wean as tolerated - Continue Brovana with Pulmicort twice a day via neb - Continue Yupelri once daily via neb - Continue prednisone at 30 mg daily (started 11/2), taper as O2 requirements decrease - PJP prophylaxis Bactrim DS Monday Wednesday Friday - Will need taper of steroids over 4 to 6-week period of time  Pneumonia ruled out Procalcitonin is normal Persistent findings on CT with  differential as above -No need for antibiotics at present except for PJP prophylaxis   ACUTE KIDNEY INJURY, resolved CKD 2, 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  Now back to baseline ~1. --Ensure oral hydration   Anemia, chronic, secondary to cancer treatment - stable --monitor Hgb   Elevated d-dimer  History PE on Eliquis - CTA Chest 10/18 negative for PE - High risk for PE recurrence due to underlying malignancy --cont Eliquis   Sepsis, ruled out --Pulm ruled out pneumonia, so no source of infection to qualify for sepsis.   NSTEMI history of CAD/stent in 2005: Troponin 3153 -> 3506 -> 2927 -> 2894.  EKG with T wave changes in lateral leads but no dynamic changes.  TTE with LVEF of 55 to 60%, G1-DD and no RWMA chest pain-free.  -Completed 48 hours of IV heparin and transition back to home Eliquis. -Medical management due to poor candidacy for heart cath in the setting of respiratory failure -Cardiology recommends outpatient follow-up when stable from respiratory standpoint.   Chronic diastolic CHF:  TTE as above.  Appears euvolemic on exam.  Does not seem to be on diuretics at home.    Hypotension: Resolved.   Stage IV lung cancer s/p radiation and chemotherapy previously.   --Dr. Rogue Bussing will see pt on Monday   Anemia of chronic disease:  H&H stable around 9's, after 1 unit --monitor Hgb   Chronic pain syndrome: --cont home oxycodone PRN   Depression and Anxiety: Stable -cont Fluoxetine and olanzapine --cont home Xanax, TID PRN, but ordered as q4h PRN so pt can have it at the time intervals that he needs.   Physical deconditioning -uses rolling walker at baseline. -Therapy  recommends SNF-cannot go until oxygen requirement does not go above 6L even with exertion. --pt encouraged to participate with PT/OT session when offered --go to the bathroom      Leukocytosis/bandemia -likely from steroid.   Goal of care counseling -patient with comorbidity as above.  Very  frail and deconditioned.  Poor long-term prognosis.  Appropriately DNR/DNI.  A candidate for hospice -Palliative medicine consulted.   DVT prophylaxis: [id number] Code Status: DNR  Family Communication:  Level of care: Med-Surg Dispo:   The patient is from: home Anticipated d/c is to: SNF Anticipated d/c date is: undetermined Patient currently is not medically ready to d/c due to: needs 10L with exertion   Subjective and Interval History:  Pt continued to desat with trips to the bathroom, though pt said he felt less short of breath than the day prior.  O2 requirement went back down to 6L with rest.    Objective: Vitals:   06/02/21 0747 06/02/21 0836 06/02/21 0844 06/02/21 1244  BP:  113/69  110/72  Pulse: 73 (!) 102  (!) 110  Resp: 20 19  18   Temp:  97.6 F (36.4 C)  98.3 F (36.8 C)  TempSrc:      SpO2: 94% 95% (!) 64% 94%  Weight:      Height:        Intake/Output Summary (Last 24 hours) at 06/02/2021 1508 Last data filed at 06/02/2021 1504 Gross per 24 hour  Intake 720 ml  Output 1560 ml  Net -840 ml   Filed Weights   05/10/21 0901 05/10/21 1537 05/29/21 0343  Weight: 63.2 kg 64 kg 60.1 kg    Examination:   Constitutional: NAD, AAOx3 HEENT: conjunctivae and lids normal, EOMI CV: No cyanosis.   RESP: normal respiratory effort, on 6L Extremities: No effusions, edema in BLE SKIN: warm, dry Neuro: II - XII grossly intact.   Psych: Normal mood and affect.  Appropriate judgement and reason   Data Reviewed: I have personally reviewed following labs and imaging studies  CBC: Recent Labs  Lab 05/29/21 0510 05/30/21 0531 05/31/21 0600 06/01/21 0345  WBC 9.3 10.3 10.7* 13.1*  HGB 9.7* 10.0* 9.4* 10.0*  HCT 31.0* 31.7* 30.4* 32.4*  MCV 97.2 96.4 97.4 98.2  PLT 170 204 197 865   Basic Metabolic Panel: Recent Labs  Lab 05/29/21 0510 05/30/21 0531 05/31/21 0600 06/01/21 0345  NA 139 138 140 138  K 4.6 4.3 4.2 3.7  CL 99 99 101 100  CO2 34* 34*  34* 32  GLUCOSE 89 97 96 177*  BUN 30* 30* 31* 30*  CREATININE 1.06 1.09 1.11 1.18  CALCIUM 9.0 8.7* 8.7* 8.7*  MG  --  1.9 2.1 1.8  PHOS 4.5  --   --   --    GFR: Estimated Creatinine Clearance: 49.5 mL/min (by C-G formula based on SCr of 1.18 mg/dL). Liver Function Tests: Recent Labs  Lab 05/29/21 0510  ALBUMIN 2.6*   No results for input(s): LIPASE, AMYLASE in the last 168 hours. No results for input(s): AMMONIA in the last 168 hours. Coagulation Profile: No results for input(s): INR, PROTIME in the last 168 hours. Cardiac Enzymes: No results for input(s): CKTOTAL, CKMB, CKMBINDEX, TROPONINI in the last 168 hours. BNP (last 3 results) No results for input(s): PROBNP in the last 8760 hours. HbA1C: No results for input(s): HGBA1C in the last 72 hours. CBG: No results for input(s): GLUCAP in the last 168 hours. Lipid Profile: No results for input(s): CHOL, HDL,  LDLCALC, TRIG, CHOLHDL, LDLDIRECT in the last 72 hours. Thyroid Function Tests: No results for input(s): TSH, T4TOTAL, FREET4, T3FREE, THYROIDAB in the last 72 hours. Anemia Panel: No results for input(s): VITAMINB12, FOLATE, FERRITIN, TIBC, IRON, RETICCTPCT in the last 72 hours. Sepsis Labs: No results for input(s): PROCALCITON, LATICACIDVEN in the last 168 hours.  No results found for this or any previous visit (from the past 240 hour(s)).    Radiology Studies: No results found.   Scheduled Meds:  apixaban  5 mg Oral BID   arformoterol  15 mcg Nebulization BID   atorvastatin  40 mg Oral Daily   budesonide (PULMICORT) nebulizer solution  0.5 mg Nebulization BID   Chlorhexidine Gluconate Cloth  6 each Topical Daily   feeding supplement  237 mL Oral TID BM   FLUoxetine  40 mg Oral Daily   guaiFENesin  30 mL Oral QID   OLANZapine  10 mg Oral QHS   predniSONE  30 mg Oral Q breakfast   revefenacin  175 mcg Nebulization Daily   sodium chloride flush  10-40 mL Intracatheter Q12H    sulfamethoxazole-trimethoprim  1 tablet Oral Once per day on Mon Wed Fri   Continuous Infusions:  sodium chloride 10 mL/hr (05/13/21 1620)     LOS: 26 days     Enzo Bi, MD [facility] If 7PM-7AM, please contact night-coverage 06/02/2021, 3:08 PM

## 2021-06-03 DIAGNOSIS — J9601 Acute respiratory failure with hypoxia: Secondary | ICD-10-CM | POA: Diagnosis not present

## 2021-06-03 LAB — CBC
HCT: 33.8 % — ABNORMAL LOW (ref 39.0–52.0)
Hemoglobin: 10.5 g/dL — ABNORMAL LOW (ref 13.0–17.0)
MCH: 30.7 pg (ref 26.0–34.0)
MCHC: 31.1 g/dL (ref 30.0–36.0)
MCV: 98.8 fL (ref 80.0–100.0)
Platelets: 203 10*3/uL (ref 150–400)
RBC: 3.42 MIL/uL — ABNORMAL LOW (ref 4.22–5.81)
RDW: 16.5 % — ABNORMAL HIGH (ref 11.5–15.5)
WBC: 9.2 10*3/uL (ref 4.0–10.5)
nRBC: 0 % (ref 0.0–0.2)

## 2021-06-03 LAB — BASIC METABOLIC PANEL
Anion gap: 5 (ref 5–15)
BUN: 28 mg/dL — ABNORMAL HIGH (ref 8–23)
CO2: 33 mmol/L — ABNORMAL HIGH (ref 22–32)
Calcium: 9.2 mg/dL (ref 8.9–10.3)
Chloride: 103 mmol/L (ref 98–111)
Creatinine, Ser: 0.96 mg/dL (ref 0.61–1.24)
GFR, Estimated: >60 mL/min (ref >60)
Glucose, Bld: 84 mg/dL (ref 70–99)
Potassium: 4.6 mmol/L (ref 3.5–5.1)
Sodium: 141 mmol/L (ref 135–145)

## 2021-06-03 LAB — MAGNESIUM: Magnesium: 2 mg/dL (ref 1.7–2.4)

## 2021-06-03 NOTE — Progress Notes (Signed)
PT Cancellation Note  Patient Details Name: Johnathyn Viscomi MRN: [id number] DOB: [birth year]   Cancelled Treatment:    Reason Eval/Treat Not Completed: Patient declined, no reason specified;Other (comment). Second attempt at PT treatment today. Pt states he is "not in a good head space" as tomorrow marks 4 weeks he has been in the hospital. Pt is agreeable to PT returning tomorrow. Will attempt treatment session at later time/date.    Patrina Levering PT, DPT 06/03/21 3:44 PM [phone]

## 2021-06-03 NOTE — Telephone Encounter (Signed)
Scheduled hospital fu with RN Janett Billow 2 A   12/1 1120 agbor etang

## 2021-06-03 NOTE — Progress Notes (Signed)
PT Cancellation Note  Patient Details Name: Jimmy West MRN: [id number] DOB: 07/27/1950   Cancelled Treatment:    Reason Eval/Treat Not Completed: Patient declined, no reason specified. Attempted PT treatment. Pt asks for PT to return later today as he would prefer to remain in bed for lunch. PT educated on benefits of sitting up out of bed while eating. PT will re-attempt treatment session at a later time/date.    Patrina Levering PT, DPT 06/03/21 11:52 AM [phone]

## 2021-06-03 NOTE — Progress Notes (Signed)
OT Cancellation Note  Patient Details Name: Jimmy West MRN: [id number] DOB: 10/10/1950   Cancelled Treatment:    Reason Eval/Treat Not Completed: Patient declined, no reason specified. [name] declined to participate in OT session this day, stating that he "is feeling lazy" and that he "just feels like resting today." Therapist wished pt a happy birthday. Will attempt OT session at a later date, as pt is willing and medically appropriate.   Josiah Lobo, PhD, MS, OTR/L 06/03/21, 3:48 PM

## 2021-06-03 NOTE — Progress Notes (Signed)
PROGRESS NOTE    Jimmy West  XHB:[id number] DOB: 01/03/1951 DOA: 05/07/2021 PCP: Pcp, No  253A/253A-AA   Assessment & Plan:   Active Problems:   Primary cancer of right lower lobe of lung (HCC)   Acute hypoxemic respiratory failure (HCC)   SOB (shortness of breath)   Severe protein-calorie malnutrition (HCC)   Non-ST elevation (NSTEMI) myocardial infarction [facility])   70 year old M with PMH of stage IV RLL lung cancer, cancer pain, CAD/stent in 2005, chronic respiratory failure with hypoxia on 2 L, anemia of chronic disease, anxiety, depression and debility presenting with generalized weakness, shortness of breath and dry cough, and admitted for acute on chronic hypoxic respiratory failure in the setting of LLL pneumonia and non-STEMI.  Started on broad-spectrum antibiotics, steroid and IV heparin.  Cardiology consulted recommended medical management and [place] outpatient.  CTA chest negative for PE.   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 DDX: Steroid refractory ICI toxicity (grade 3), lymphangitic spread of CA (not entirely ruled out) - Procalcitonin's have been negative, KL6 >> 1,866 (reference 0-500) - Fungitell negative - Received infliximab 5 mg/kg x 1, 11/02, repeat in 2- 3 weeks x1 - 5L-7L O2 requirement currently at rest, desat with exertion Plan: --Continue supplemental O2 to keep sats between 88-92%, wean as tolerated - Continue Brovana with Pulmicort twice a day via neb - Continue Yupelri once daily via neb - Continue prednisone at 30 mg daily (started 11/2), taper as O2 requirements decrease - PJP prophylaxis Bactrim DS Monday Wednesday Friday - Will need taper of steroids over 4 to 6-week period of time  Pneumonia ruled out Procalcitonin is normal Persistent findings on CT with  differential as above -No need for antibiotics at present except for PJP prophylaxis   ACUTE KIDNEY INJURY, resolved CKD 2, 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  Now back to baseline ~1. --Ensure oral hydration   Anemia, chronic, secondary to cancer treatment - stable --monitor Hgb   Elevated d-dimer  History PE on Eliquis - CTA Chest 10/18 negative for PE - High risk for PE recurrence due to underlying malignancy --cont Eliquis   Sepsis, ruled out --Pulm ruled out pneumonia, so no source of infection to qualify for sepsis.   NSTEMI history of CAD/stent in 2005: Troponin 3153 -> 3506 -> 2927 -> 2894.  EKG with T wave changes in lateral leads but no dynamic changes.  TTE with LVEF of 55 to 60%, G1-DD and no RWMA chest pain-free.  -Completed 48 hours of IV heparin and transition back to home Eliquis. -Medical management due to poor candidacy for heart cath in the setting of respiratory failure -Cardiology recommends outpatient follow-up when stable from respiratory standpoint.   Chronic diastolic CHF:  TTE as above.  Appears euvolemic on exam.  Does not seem to be on diuretics at home.    Hypotension: Resolved.   Stage IV lung cancer s/p radiation and chemotherapy previously.   --Dr. Rogue Bussing saw pt today   Anemia of chronic disease:  H&H stable around 9's, after 1 unit --monitor Hgb   Chronic pain syndrome: --cont home oxycodone PRN   Depression and Anxiety: Stable -cont Fluoxetine and olanzapine --cont home Xanax, TID PRN, but ordered as q4h PRN so pt can have it at the time intervals that he needs.   Physical deconditioning -uses rolling walker at baseline. -Therapy recommends SNF-cannot  go until oxygen requirement does not go above 6L even with exertion. --pt encouraged to participate with PT/OT session when offered --go to the bathroom      Leukocytosis/bandemia -likely from steroid.   Goal of care counseling -patient with comorbidity as above.  Very frail and  deconditioned.  Poor long-term prognosis.  Appropriately DNR/DNI.  A candidate for hospice -Palliative medicine consulted.   DVT prophylaxis: GE:ZMOQHUT Code Status: DNR  Family Communication:  Level of care: Med-Surg Dispo:   The patient is from: home Anticipated d/c is to: SNF Anticipated d/c date is: undetermined Patient currently is not medically ready to d/c due to: needs 10L with exertion   Subjective and Interval History:  Pt reported having more dyspnea today.  Not as motivated today to do mobility.  Dr. Rogue Bussing saw pt today.   Objective: Vitals:   06/03/21 0759 06/03/21 0822 06/03/21 1136 06/03/21 1535  BP: (!) 149/71  (!) 85/69 118/78  Pulse: 85 85 94 (!) 101  Resp: 18 16 19 18   Temp: 97.7 F (36.5 C)  97.9 F (36.6 C) 97.8 F (36.6 C)  TempSrc:    Oral  SpO2: 96% 97% 92% 94%  Weight:      Height:        Intake/Output Summary (Last 24 hours) at 06/03/2021 1618 Last data filed at 06/03/2021 1536 Gross per 24 hour  Intake 1320 ml  Output 2675 ml  Net -1355 ml   Filed Weights   05/10/21 0901 05/10/21 1537 05/29/21 0343  Weight: 63.2 kg 64 kg 60.1 kg    Examination:   Constitutional: NAD, AAOx3 HEENT: conjunctivae and lids normal, EOMI CV: No cyanosis.   RESP: normal respiratory effort at rest, on 7L Extremities: No effusions, edema in BLE SKIN: warm, dry Neuro: II - XII grossly intact.   Psych: depressed mood and affect.  Appropriate judgement and reason   Data Reviewed: I have personally reviewed following labs and imaging studies  CBC: Recent Labs  Lab 05/29/21 0510 05/30/21 0531 05/31/21 0600 06/01/21 0345 06/03/21 0600  WBC 9.3 10.3 10.7* 13.1* 9.2  HGB 9.7* 10.0* 9.4* 10.0* 10.5*  HCT 31.0* 31.7* 30.4* 32.4* 33.8*  MCV 97.2 96.4 97.4 98.2 98.8  PLT 170 204 197 215 654   Basic Metabolic Panel: Recent Labs  Lab 05/29/21 0510 05/30/21 0531 05/31/21 0600 06/01/21 0345 06/03/21 0600  NA 139 138 140 138 141  K 4.6 4.3 4.2  3.7 4.6  CL 99 99 101 100 103  CO2 34* 34* 34* 32 33*  GLUCOSE 89 97 96 177* 84  BUN 30* 30* 31* 30* 28*  CREATININE 1.06 1.09 1.11 1.18 0.96  CALCIUM 9.0 8.7* 8.7* 8.7* 9.2  MG  --  1.9 2.1 1.8 2.0  PHOS 4.5  --   --   --   --    GFR: Estimated Creatinine Clearance: 60 mL/min (by C-G formula based on SCr of 0.96 mg/dL). Liver Function Tests: Recent Labs  Lab 05/29/21 0510  ALBUMIN 2.6*   No results for input(s): LIPASE, AMYLASE in the last 168 hours. No results for input(s): AMMONIA in the last 168 hours. Coagulation Profile: No results for input(s): INR, PROTIME in the last 168 hours. Cardiac Enzymes: No results for input(s): CKTOTAL, CKMB, CKMBINDEX, TROPONINI in the last 168 hours. BNP (last 3 results) No results for input(s): PROBNP in the last 8760 hours. HbA1C: No results for input(s): HGBA1C in the last 72 hours. CBG: No results for input(s): GLUCAP in the  last 168 hours. Lipid Profile: No results for input(s): CHOL, HDL, LDLCALC, TRIG, CHOLHDL, LDLDIRECT in the last 72 hours. Thyroid Function Tests: No results for input(s): TSH, T4TOTAL, FREET4, T3FREE, THYROIDAB in the last 72 hours. Anemia Panel: No results for input(s): VITAMINB12, FOLATE, FERRITIN, TIBC, IRON, RETICCTPCT in the last 72 hours. Sepsis Labs: No results for input(s): PROCALCITON, LATICACIDVEN in the last 168 hours.  No results found for this or any previous visit (from the past 240 hour(s)).    Radiology Studies: No results found.   Scheduled Meds:  apixaban  5 mg Oral BID   arformoterol  15 mcg Nebulization BID   atorvastatin  40 mg Oral Daily   budesonide (PULMICORT) nebulizer solution  0.5 mg Nebulization BID   Chlorhexidine Gluconate Cloth  6 each Topical Daily   feeding supplement  237 mL Oral TID BM   FLUoxetine  40 mg Oral Daily   guaiFENesin  15 mL Oral QID   OLANZapine  10 mg Oral QHS   predniSONE  30 mg Oral Q breakfast   revefenacin  175 mcg Nebulization Daily   sodium  chloride flush  10-40 mL Intracatheter Q12H   sulfamethoxazole-trimethoprim  1 tablet Oral Once per day on Mon Wed Fri   Continuous Infusions:  sodium chloride 10 mL/hr (05/13/21 1620)     LOS: 39 days     Enzo Bi, MD [facility] If 7PM-7AM, please contact night-coverage 06/03/2021, 4:18 PM

## 2021-06-04 DIAGNOSIS — J9601 Acute respiratory failure with hypoxia: Secondary | ICD-10-CM | POA: Diagnosis not present

## 2021-06-04 MED ORDER — GUAIFENESIN ER 600 MG PO TB12
1200.0000 mg | ORAL_TABLET | Freq: Two times a day (BID) | ORAL | Status: DC
  Administered 2021-06-04 – 2021-06-13 (×18): 1200 mg via ORAL
  Filled 2021-06-04 (×17): qty 2

## 2021-06-04 NOTE — TOC Progression Note (Signed)
Transition of Care [facility]) - Progression Note    Patient Details  Name: Jimmy West MRN: [id number] Date of Birth: 1950/11/14  Transition of Care [facility]) CM/SW [place], [place] Phone Number: 06/04/2021, 2:01 PM  Clinical Narrative:     CSW notes patient continues to require 10-12 L of O2 during ambulation, does not meet SNF requirements of below 6 L of O2 needs at this time and does not meet [facility] requirements as well.   Patient continues to work on weaning O2 for SNF.     Expected Discharge Plan: Skilled Nursing Facility Barriers to Discharge: No Barriers Identified  Expected Discharge Plan and Services Expected Discharge Plan: [place] Choice: [place] arrangements for the past 2 months: Single Family Home                                       Social Determinants of Health (SDOH) Interventions    Readmission Risk Interventions Readmission Risk Prevention Plan 04/26/2021  Transportation Screening Complete  PCP or Specialist Appt within 3-5 Days Complete  HRI or [place] Complete  Social Work Consult for [place] Planning/Counseling Complete  Palliative Care Screening Not Applicable  Medication Review [employer]) Complete  Some recent data might be hidden

## 2021-06-04 NOTE — Evaluation (Signed)
Occupational Therapy Evaluation Patient Details Name: Jimmy West MRN: [id number] DOB: 07-20-1951 Today's Date: 06/04/2021   History of Present Illness 70 y.o. male was brought to hosp to admit 10/18 for hypoxia, weakness, SOB and noted sepsis from PNA, as well as NSTEMI.  Troponins are high from demand ischemia, EF 40-45%, and has recent asp PNA per chart.  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 Pt reports living at home independently without use of AD at baseline. Pt reports driving and receiving meals on wheels. He does have family/friends that can assist intermittently. Currently pt continues to demonstrate impairments as described below (See OT problem list) which functionally limit his ability to perform ADL/self-care tasks. Pt currently requires supervision for bed mobility, ADL transfers, and able to perform toileting from seated position with lateral lean and remote supervision. RN cleared to increase O2 to 10L prior to exertion to support activity. SpO2 down to mid 80's getting from the bed to the bathroom, VC for PLB. With return to bed afterwards, pt noted with increased WOB, HR up to 115 and SpO2 down to 80%. Bumped up to 12L O2 + VC for PLB and to just focus on breathing in through his nose (not to worry about how fast/slow) Once recovered somewhat, pt reports "no one has ever told me about that part and that really helped to breath a little faster starting out." Back down to 10L O2, pt required MIN A for LB and UB dressing from bed level which was pt's preference. Left on 10L O2, SpO2 88-89%, RN notified. Pt would benefit from skilled OT services to address noted impairments and functional limitations (see below for any additional details) in order to maximize safety  and independence while minimizing falls risk and caregiver burden. Current goals reviewed and updated as appropriate. Upon hospital discharge, continue to recommend STR to maximize pt safety and return to PLOF.    Recommendations for follow up therapy are one component of a multi-disciplinary discharge planning process, led by the attending physician.  Recommendations may be updated based on patient status, additional functional criteria and insurance authorization.   Follow Up Recommendations  Skilled nursing-short term rehab (<3 hours/day)    Assistance Recommended at Discharge Intermittent Supervision/Assistance  Functional Status Assessment  Patient has had a recent decline in their functional status and demonstrates the ability to make significant improvements in function in a reasonable and predictable amount of time.  Equipment Recommendations  BSC/3in1    Recommendations for Other Services       Precautions / Restrictions Precautions Precautions: Fall Precaution Comments: monitor sats and HR; mod fall risk Restrictions Weight Bearing Restrictions: No      Mobility Bed Mobility Overal bed mobility: Needs Assistance Bed Mobility: Supine to Sit;Sit to Supine     Supine to sit: HOB elevated;Min guard;Min assist Sit to supine: Supervision   General bed mobility comments: pt pulled on OT's hand for liited assist to come to EOB    Transfers Overall transfer level: Needs assistance Equipment used: None Transfers: Sit to/from Stand Sit to Stand: Supervision           General transfer comment: SBA      Balance Overall balance assessment: Needs assistance Sitting-balance support: Feet supported;No upper extremity supported Sitting balance-Leahy Scale:  Good     Standing balance support: No upper extremity supported Standing balance-Leahy Scale: Fair                             ADL either performed or assessed with clinical judgement   ADL Overall  ADL's : Needs assistance/impaired                         Toilet Transfer: Supervision/safety;Regular Museum/gallery exhibitions officer and Hygiene: Sitting/lateral lean;Modified independent       Functional mobility during ADLs: Supervision/safety       Vision         Perception     Praxis      Pertinent Vitals/Pain Pain Assessment: No/denies pain     Hand Dominance Right   Extremity/Trunk Assessment Upper Extremity Assessment Upper Extremity Assessment: Generalized weakness   Lower Extremity Assessment Lower Extremity Assessment: Generalized weakness   Cervical / Trunk Assessment Cervical / Trunk Assessment: Normal   Communication Communication Communication: No difficulties   Cognition Arousal/Alertness: Awake/alert Behavior During Therapy: WFL for tasks assessed/performed Overall Cognitive Status: Within Functional Limits for tasks assessed                                       General Comments       Exercises Other Exercises Other Exercises: Pt required additional instruction in PLB to support breath recovery with exertion as he still has tendency to breathe through his mouth   Shoulder Instructions      Home Living Family/patient expects to be discharged to:: Private residence Living Arrangements: Alone Available Help at Discharge: Family;Friend(s);Available PRN/intermittently Type of Home: Mobile home Home Access: Stairs to enter Entrance Stairs-Number of Steps: 5 Entrance Stairs-Rails: Right;Left;Can reach both Home Layout: One level     Bathroom Shower/Tub: Walk-in shower         Home Equipment: None;Cane - quad          Prior Functioning/Environment                          OT Problem List: Decreased strength;Cardiopulmonary status limiting activity;Decreased activity tolerance;Impaired balance (sitting and/or standing)      OT Treatment/Interventions: Self-care/ADL  training;Therapeutic exercise;Energy conservation;DME and/or AE instruction;Patient/family education;Modalities;Therapeutic activities;Balance training    OT Goals(Current goals can be found in the care plan section) Acute Rehab OT Goals Patient Stated Goal: get better OT Goal Formulation: With patient Time For Goal Achievement: 06/18/21 Potential to Achieve Goals: Good  OT Frequency: Min 2X/week   Barriers to D/C: Decreased caregiver support          Co-evaluation              AM-PAC OT "6 Clicks" Daily Activity     Outcome Measure Help from another person eating meals?: None Help from another person taking care of personal grooming?: A Little Help from another person toileting, which includes using toliet, bedpan, or urinal?: A Little Help from another person bathing (including washing, rinsing, drying)?: A Little Help from another person to put on and taking off regular upper body clothing?: A Little Help from another person to put on and taking off regular lower body clothing?: A Little 6 Click Score: 19   End of Session Equipment Utilized During Treatment: Oxygen Nurse  Communication: Other (comment) (RN cleared pt to go to the bathroom, O2 up to 10 then 11L O2 for task, was on 6L O2 at rest)  Activity Tolerance: Patient tolerated treatment well Patient left: in bed;with call bell/phone within reach  OT Visit Diagnosis: Other abnormalities of gait and mobility (R26.89);Muscle weakness (generalized) (M62.81);Unsteadiness on feet (R26.81)                Time: 0211-1735 OT Time Calculation (min): 35 min Charges:  OT General Charges $OT Visit: 1 Visit OT Evaluation $OT Re-eval: 1 Re-eval OT Treatments $Self Care/Home Management : 23-37 mins  Ardeth Perfect., MPH, MS, OTR/L ascom [phone] 06/04/21, 11:43 AM

## 2021-06-04 NOTE — Progress Notes (Signed)
Physical Therapy Treatment Patient Details Name: Jimmy West MRN: [id number] DOB: 04/23/1951 Today's Date: 06/04/2021   History of Present Illness 70 y.o. male was brought to hosp to admit 10/18 for hypoxia, weakness, SOB and noted sepsis from PNA, as well as NSTEMI.  Troponins are high from demand ischemia, EF 40-45%, and has recent asp PNA per chart.  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 Pt did reports mild LBP however RN states he is not due for more pain meds for 40 minutes. Pt performed al therex on 10L O2. Continuous O2 monitoring was performed. He was able to maintain O2 sats 95% or greater. Some therapeutic exercises were resisted, others performed against gravity. Pt did state his sciatic nerve was flared up so PT also performed passive stretching with pt in which his left piriformis was significantly tighter than the right. Pt continues to present with global weakness and decreased functional endurance. He would benefit from skilled PT to address above deficits and promote optimal return to PLOF.   Recommendations for follow up therapy are one component of a multi-disciplinary discharge planning process, led by the attending physician.  Recommendations may be updated based on patient status, additional functional criteria and insurance authorization.  Follow Up Recommendations  Skilled nursing-short term rehab (<3 hours/day)     Assistance Recommended at Discharge Intermittent Supervision/Assistance  Equipment Recommendations  None recommended by PT    Recommendations for Other Services       Precautions / Restrictions Precautions Precautions: Fall Precaution Comments: monitor sats and HR; mod fall risk Restrictions Weight Bearing  Restrictions: No Other Position/Activity Restrictions: Watch SpO2     Mobility  Bed Mobility                    Transfers                        Ambulation/Gait                   Stairs             Wheelchair Mobility    Modified Rankin (Stroke Patients Only)       Balance                                            Cognition Arousal/Alertness: Awake/alert Behavior During Therapy: WFL for tasks assessed/performed Overall Cognitive Status: Within Functional Limits for tasks assessed                                          Exercises General Exercises - Lower Extremity Ankle Circles/Pumps: AROM;Both;Supine;20 reps Hip ABduction/ADduction: Strengthening;Both;Supine;10 reps Straight Leg Raises: AROM;Strengthening;Both;15 reps;Supine Other Exercises Other Exercises: LE therex listed above plus: manually resisted leg press, manually resisted hip abduction, TA activation with hip flexion march in hooklying. Hamstring stretch, piriformis stretch x60 seconds BLE.    General Comments        Pertinent Vitals/Pain Pain Assessment: 0-10 Pain Score: 5  Pain Location: back Pain  Descriptors / Indicators: Aching;Pressure Pain Intervention(s): Patient requesting pain meds-RN notified;Monitored during session    Home Living                          Prior Function            PT Goals (current goals can now be found in the care plan section) Acute Rehab PT Goals Patient Stated Goal: to go home PT Goal Formulation: With patient Time For Goal Achievement: 05/25/21 Potential to Achieve Goals: Fair    Frequency    Min 2X/week      PT Plan      Co-evaluation              AM-PAC PT "6 Clicks" Mobility   Outcome Measure  Help needed turning from your back to your side while in a flat bed without using bedrails?: None Help needed moving from lying on your back to sitting on the side  of a flat bed without using bedrails?: A Little Help needed moving to and from a bed to a chair (including a wheelchair)?: A Little Help needed standing up from a chair using your arms (e.g., wheelchair or bedside chair)?: A Little Help needed to walk in hospital room?: A Lot Help needed climbing 3-5 steps with a railing? : Total 6 Click Score: 16    End of Session Equipment Utilized During Treatment: Oxygen Activity Tolerance: Patient tolerated treatment well Patient left: in bed;with call bell/phone within reach;with bed alarm set Nurse Communication: Mobility status PT Visit Diagnosis: Muscle weakness (generalized) (M62.81);Difficulty in walking, not elsewhere classified (R26.2);Unsteadiness on feet (R26.81)     Time: 0174-9449 PT Time Calculation (min) (ACUTE ONLY): 26 min  Charges:  $Therapeutic Exercise: 23-37 mins                     Patrina Levering PT, DPT 06/04/21 2:50 PM [phone]

## 2021-06-04 NOTE — Progress Notes (Signed)
PROGRESS NOTE    Jimmy West  BMW:[id number] DOB: May 25, 1951 DOA: 05/07/2021 PCP: Pcp, No  253A/253A-AA   Assessment & Plan:   Active Problems:   Primary cancer of right lower lobe of lung (HCC)   Acute hypoxemic respiratory failure (HCC)   SOB (shortness of breath)   Severe protein-calorie malnutrition (HCC)   Non-ST elevation (NSTEMI) myocardial infarction [facility])   70 year old M with PMH of stage IV RLL lung cancer, cancer pain, CAD/stent in 2005, chronic respiratory failure with hypoxia on 2 L, anemia of chronic disease, anxiety, depression and debility presenting with generalized weakness, shortness of breath and dry cough, and admitted for acute on chronic hypoxic respiratory failure in the setting of LLL pneumonia and non-STEMI.  Started on broad-spectrum antibiotics, steroid and IV heparin.  Cardiology consulted recommended medical management and [place] outpatient.  CTA chest negative for PE.   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 Patsey Berthold has been managing. --Procalcitonin's have been negative, KL6 >> 1,866 (reference 0-500) - Fungitell negative - Received infliximab 5 mg/kg x 1, 11/02, repeat in 2- 3 weeks x1 - 5L-7L O2 requirement currently at rest, desat with exertion and need 10-12L. Plan: --management per Dr. Patsey Berthold  --Continue supplemental O2 to keep sats between 88-92%, wean as tolerated - Continue Brovana with Pulmicort twice a day via neb - Continue Yupelri once daily via neb - Continue prednisone at 30 mg daily (started 11/2), taper as O2 requirements decrease - PJP prophylaxis Bactrim DS Monday Wednesday Friday - Will need taper of steroids over 4 to  6-week period of time  Pneumonia ruled out --pt had had multiple courses of abx for PNA treatment with no consistent improvement.  Per Dr. Patsey Berthold, pt did no have PNA, but pneumonitis from possible ICI toxicity. 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  Now back to baseline ~1. --Ensure oral hydration   Anemia, chronic, secondary to cancer treatment - stable --monitor Hgb   Elevated d-dimer  History PE on Eliquis - CTA Chest 10/18 negative for PE - High risk for PE recurrence due to underlying malignancy --cont Eliquis   Sepsis, ruled out --Pulm ruled out pneumonia, so no source of infection to qualify for sepsis.   NSTEMI history of CAD/stent in 2005: Troponin 3153 -> 3506 -> 2927 -> 2894.  EKG with T wave changes in lateral leads but no dynamic changes.  TTE with LVEF of 55 to 60%, G1-DD and no RWMA chest pain-free.  -Completed 48 hours of IV heparin and transition back to home Eliquis. -Medical management due to poor candidacy for heart cath in the setting of respiratory failure -Cardiology recommends outpatient follow-up when stable from respiratory standpoint.   Chronic diastolic CHF:  TTE as above.  Appears euvolemic on exam.  Does not seem to be on diuretics at home.    Hypotension: Resolved.   Stage IV lung cancer s/p radiation and chemotherapy previously.   --Dr. Rogue Bussing saw pt on 11/14, and discussed with pt that with his poor current performance status, pt can not receive cancer treatment.   Anemia of chronic disease:  H&H stable around 9's, after 1 unit --monitor Hgb  Chronic pain syndrome: --cont home oxycodone PRN   Depression and Anxiety: Stable -cont Fluoxetine and olanzapine --cont home Xanax, TID PRN, but ordered as q4h PRN so pt can have it at the time intervals that he needs.   Physical deconditioning -uses rolling walker at baseline. -Therapy  recommends SNF-cannot go until oxygen requirement does not go above 6L even with exertion. --pt encouraged to participate with PT/OT session when offered --up to go to the bathroom      Leukocytosis/bandemia -likely from steroid.   Goal of care counseling -patient with comorbidity as above.  Very frail and deconditioned.  Poor long-term prognosis.  Appropriately DNR/DNI.  -Palliative medicine consulted.   DVT prophylaxis: OB:SJGGEZM Code Status: DNR  Family Communication:  Level of care: Med-Surg Dispo:   The patient is from: home Anticipated d/c is to: SNF Anticipated d/c date is: undetermined Patient currently is not medically ready to d/c due to: needs 10L with exertion   Subjective and Interval History:  Pt was feeling down yesterday, but felt better today, and stronger.  Worked with both PT and OT.   Objective: Vitals:   06/04/21 0738 06/04/21 0901 06/04/21 1129 06/04/21 1540  BP: 114/76  111/82 104/77  Pulse: 94 (!) 106 90 97  Resp: 17 20 17 18   Temp: 97.9 F (36.6 C)  98 F (36.7 C) 97.8 F (36.6 C)  TempSrc:   Oral Oral  SpO2: 96% 92% 95% 95%  Weight:      Height:        Intake/Output Summary (Last 24 hours) at 06/04/2021 1648 Last data filed at 06/04/2021 1430 Gross per 24 hour  Intake 1200 ml  Output 1950 ml  Net -750 ml   Filed Weights   05/10/21 0901 05/10/21 1537 05/29/21 0343  Weight: 63.2 kg 64 kg 60.1 kg    Examination:   Constitutional: NAD, AAOx3 HEENT: conjunctivae and lids normal, EOMI CV: No cyanosis.   RESP: normal respiratory effort at rest, on 10L [place] Extremities: No effusions, edema in BLE SKIN: warm, dry Neuro: II - XII grossly intact.   Psych: better mood and affect today.  Appropriate judgement and reason   Data Reviewed: I have personally reviewed following labs and imaging studies  CBC: Recent Labs  Lab 05/29/21 0510 05/30/21 0531 05/31/21 0600 06/01/21 0345 06/03/21 0600  WBC 9.3 10.3 10.7* 13.1* 9.2  HGB 9.7*  10.0* 9.4* 10.0* 10.5*  HCT 31.0* 31.7* 30.4* 32.4* 33.8*  MCV 97.2 96.4 97.4 98.2 98.8  PLT 170 204 197 215 629   Basic Metabolic Panel: Recent Labs  Lab 05/29/21 0510 05/30/21 0531 05/31/21 0600 06/01/21 0345 06/03/21 0600  NA 139 138 140 138 141  K 4.6 4.3 4.2 3.7 4.6  CL 99 99 101 100 103  CO2 34* 34* 34* 32 33*  GLUCOSE 89 97 96 177* 84  BUN 30* 30* 31* 30* 28*  CREATININE 1.06 1.09 1.11 1.18 0.96  CALCIUM 9.0 8.7* 8.7* 8.7* 9.2  MG  --  1.9 2.1 1.8 2.0  PHOS 4.5  --   --   --   --    GFR: Estimated Creatinine Clearance: 60 mL/min (by C-G formula based on SCr of 0.96 mg/dL). Liver Function Tests: Recent Labs  Lab 05/29/21 0510  ALBUMIN 2.6*   No results for input(s): LIPASE, AMYLASE in the last 168 hours. No results for input(s): AMMONIA in the last 168 hours. Coagulation Profile: No results for input(s): INR, PROTIME in the last 168 hours. Cardiac  Enzymes: No results for input(s): CKTOTAL, CKMB, CKMBINDEX, TROPONINI in the last 168 hours. BNP (last 3 results) No results for input(s): PROBNP in the last 8760 hours. HbA1C: No results for input(s): HGBA1C in the last 72 hours. CBG: No results for input(s): GLUCAP in the last 168 hours. Lipid Profile: No results for input(s): CHOL, HDL, LDLCALC, TRIG, CHOLHDL, LDLDIRECT in the last 72 hours. Thyroid Function Tests: No results for input(s): TSH, T4TOTAL, FREET4, T3FREE, THYROIDAB in the last 72 hours. Anemia Panel: No results for input(s): VITAMINB12, FOLATE, FERRITIN, TIBC, IRON, RETICCTPCT in the last 72 hours. Sepsis Labs: No results for input(s): PROCALCITON, LATICACIDVEN in the last 168 hours.  No results found for this or any previous visit (from the past 240 hour(s)).    Radiology Studies: No results found.   Scheduled Meds:  apixaban  5 mg Oral BID   arformoterol  15 mcg Nebulization BID   atorvastatin  40 mg Oral Daily   budesonide (PULMICORT) nebulizer solution  0.5 mg Nebulization BID    Chlorhexidine Gluconate Cloth  6 each Topical Daily   feeding supplement  237 mL Oral TID BM   FLUoxetine  40 mg Oral Daily   guaiFENesin  15 mL Oral QID   OLANZapine  10 mg Oral QHS   predniSONE  30 mg Oral Q breakfast   revefenacin  175 mcg Nebulization Daily   sodium chloride flush  10-40 mL Intracatheter Q12H   sulfamethoxazole-trimethoprim  1 tablet Oral Once per day on Mon Wed Fri   Continuous Infusions:  sodium chloride 10 mL/hr (05/13/21 1620)     LOS: 28 days     Enzo Bi, MD [facility] If 7PM-7AM, please contact night-coverage 06/04/2021, 4:48 PM

## 2021-06-04 NOTE — Progress Notes (Signed)
PT Cancellation Note  Patient Details Name: Jimmy West MRN: [id number] DOB: 11/26/1950   Cancelled Treatment:    Reason Eval/Treat Not Completed: Other (comment). Upon arrival, OT in room and pt has just completed session. OT and pt were working towards recovering O2 saturation. PT will re-attempt as pt is willing and appropriate.    Patrina Levering PT, DPT 06/04/21 12:19 PM [phone]

## 2021-06-05 DIAGNOSIS — J9601 Acute respiratory failure with hypoxia: Secondary | ICD-10-CM | POA: Diagnosis not present

## 2021-06-05 DIAGNOSIS — J849 Interstitial pulmonary disease, unspecified: Secondary | ICD-10-CM | POA: Diagnosis not present

## 2021-06-05 LAB — CBC
HCT: 31.5 % — ABNORMAL LOW (ref 39.0–52.0)
Hemoglobin: 9.9 g/dL — ABNORMAL LOW (ref 13.0–17.0)
MCH: 31 pg (ref 26.0–34.0)
MCHC: 31.4 g/dL (ref 30.0–36.0)
MCV: 98.7 fL (ref 80.0–100.0)
Platelets: 201 10*3/uL (ref 150–400)
RBC: 3.19 MIL/uL — ABNORMAL LOW (ref 4.22–5.81)
RDW: 16.4 % — ABNORMAL HIGH (ref 11.5–15.5)
WBC: 9.2 10*3/uL (ref 4.0–10.5)
nRBC: 0 % (ref 0.0–0.2)

## 2021-06-05 LAB — BASIC METABOLIC PANEL
Anion gap: 6 (ref 5–15)
BUN: 26 mg/dL — ABNORMAL HIGH (ref 8–23)
CO2: 34 mmol/L — ABNORMAL HIGH (ref 22–32)
Calcium: 9 mg/dL (ref 8.9–10.3)
Chloride: 98 mmol/L (ref 98–111)
Creatinine, Ser: 0.87 mg/dL (ref 0.61–1.24)
GFR, Estimated: >60 mL/min (ref >60)
Glucose, Bld: 114 mg/dL — ABNORMAL HIGH (ref 70–99)
Potassium: 4.5 mmol/L (ref 3.5–5.1)
Sodium: 138 mmol/L (ref 135–145)

## 2021-06-05 LAB — MAGNESIUM: Magnesium: 1.9 mg/dL (ref 1.7–2.4)

## 2021-06-05 NOTE — Progress Notes (Signed)
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  Has been treated with steroids and infliximab. Improved oxygen needs after infliximab given on 11/2, still on 5 L [place]  Recommend continuing prednisone as per Dr. Domingo Dimes instructions, no further recommendations.  Pulmonary available to see as needed.  Roselie Awkward, MD [place] [facility] Pager: [phone] Cell: [phone] After 7:00 pm call [place]  [phone]

## 2021-06-05 NOTE — TOC Progression Note (Signed)
Transition of Care [facility]) - Progression Note    Patient Details  Name: Jimmy West MRN: [id number] Date of Birth: [birth year]  Transition of Care [facility] CM/SW [place], [place] Phone Number: 06/05/2021, 4:26 PM  Clinical Narrative:     CSW notes patient continues to need up to 10L of O2 on ambulation and SNF's require 6L or less. Per RN patient may be interested in going home with Home Health and oxygen, as he is able to set his oxygen up on ambulation himself.   CSW did follow up with Zack with [employer] to inquire as to highest level of home oxygen that is typically set up. They report they like to see around 8 or 9 LPM for safety reasons but their concentrator goes up to 10 LPM. Treatment team updated.   Palliative to re consult, if patient does go home and does not plan on hospice TOC recommends having outpatient palliative follow patient at time of discharge along with home health max services.   TOC will continue to follow for discharge planning.   Expected Discharge Plan: Skilled Nursing Facility Barriers to Discharge: No Barriers Identified  Expected Discharge Plan and Services Expected Discharge Plan: [place] Choice: [place] arrangements for the past 2 months: Single Family Home                                       Social Determinants of Health (SDOH) Interventions    Readmission Risk Interventions Readmission Risk Prevention Plan 04/26/2021  Transportation Screening Complete  PCP or Specialist Appt within 3-5 Days Complete  HRI or [place] Raices Complete  Social Work Consult for [place] Planning/Counseling Complete  Palliative Care Screening Not Applicable  Medication Review Press photographer) Complete  Some recent data might be hidden

## 2021-06-05 NOTE — Progress Notes (Signed)
PROGRESS NOTE    Jimmy West  XTK:[id number] DOB: [birth year] DOA: 05/07/2021 PCP: Pcp, No  253A/253A-AA   Assessment & Plan:   Active Problems:   Primary cancer of right lower lobe of lung (HCC)   Acute hypoxemic respiratory failure (HCC)   SOB (shortness of breath)   Severe protein-calorie malnutrition (HCC)   Non-ST elevation (NSTEMI) myocardial infarction [facility])   70 year old M with PMH of stage IV RLL lung cancer, cancer pain, CAD/stent in 2005, chronic respiratory failure with hypoxia on 2 L, anemia of chronic disease, anxiety, depression and debility presenting with generalized weakness, shortness of breath and dry cough, and admitted for acute on chronic hypoxic respiratory failure in the setting of LLL pneumonia and non-STEMI.  Started on broad-spectrum antibiotics, steroid and IV heparin.  Cardiology consulted recommended medical management and [place] outpatient.  CTA chest negative for PE.   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 Patsey Berthold on Friday about any need for IVIG, and the timing for repeat infliximab if indicated.-I have discussed with the patient, encouraged him to use incentive spirometry, flutter valve, and to get out of bed to chair. -Overall has been with decreased oxygen requirement, but does significantly increased with minimal activity, he  required to go up to 12 L nasal cannula only when he moved to the bathroom.   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  Now back to baseline ~1. --Ensure oral hydration   Anemia, chronic, secondary to cancer treatment - stable --monitor Hgb   Elevated d-dimer  History PE on Eliquis - CTA Chest 10/18 negative for PE - High risk for PE recurrence due to underlying malignancy --cont Eliquis   Sepsis, ruled out --Pulm ruled out pneumonia, so no source of infection to qualify for sepsis.   NSTEMI history of CAD/stent in 2005: Troponin 3153 -> 3506 -> 2927 -> 2894.  EKG with T wave changes in lateral leads but no dynamic changes.  TTE with LVEF of 55 to 60%, G1-DD and no RWMA chest pain-free.  -Completed 48 hours of IV heparin and transition back to home Eliquis. -Medical management due to poor candidacy for heart cath in the setting of respiratory failure -Cardiology recommends outpatient follow-up when stable from respiratory standpoint.   Chronic diastolic CHF:  TTE as above.  Appears euvolemic on exam.  Does not seem to be on diuretics at home.    Hypotension: Resolved.   Stage IV lung cancer s/p radiation and chemotherapy previously.   --Dr. Rogue Bussing will see pt on Monday   Anemia of chronic disease:  H&H stable around 9's, after 1 unit --monitor Hgb   Chronic pain syndrome: --cont home oxycodone PRN   Depression and Anxiety: Stable -cont Fluoxetine and olanzapine --cont home Xanax, TID PRN, but ordered as  q4h PRN so pt can have it at the time intervals that he needs.   Physical deconditioning -uses rolling walker at baseline. -Therapy recommends SNF-cannot go until oxygen requirement does not go above 6L even with exertion. --pt encouraged to participate with PT/OT session  when offered --go to the bathroom      Leukocytosis/bandemia -likely from steroid.   Goal of care counseling -patient with comorbidity as above.  Very frail and deconditioned.  Poor long-term prognosis.  Appropriately DNR/DNI.  A candidate for hospice -Palliative medicine consulted.   DVT prophylaxis: OB:SJGGEZM Code Status: DNR  Family Communication:  Level of care: Med-Surg Dispo:   The patient is from: home Anticipated d/c is to: SNF Anticipated d/c date is: undetermined Patient currently is not medically ready to d/c due to: needs 10L with exertion   Subjective and Interval History:   Reports appetite is good, he is with significant dyspnea with minimal activity, but overall has been gradually requiring less oxygen at rest.   Objective: Vitals:   06/05/21 0736 06/05/21 0754 06/05/21 1019 06/05/21 1130  BP: 114/72   102/65  Pulse: 78   94  Resp: 18   18  Temp: (!) 97.4 F (36.3 C)   97.8 F (36.6 C)  TempSrc:      SpO2: 100% 92% 90% 95%  Weight:      Height:        Intake/Output Summary (Last 24 hours) at 06/05/2021 1418 Last data filed at 06/05/2021 1130 Gross per 24 hour  Intake 720 ml  Output 2875 ml  Net -2155 ml   Filed Weights   05/10/21 0901 05/10/21 1537 05/29/21 0343  Weight: 63.2 kg 64 kg 60.1 kg    Examination:   Awake Alert, Oriented X 3, No new F.N deficits, Normal affect Symmetrical Chest wall movement, Good air movement bilaterally, scattered Rales. RRR,No Gallops,Rubs or new Murmurs, No Parasternal Heave +ve B.Sounds, Abd Soft, No tenderness, No rebound - guarding or rigidity. No Cyanosis, Clubbing or edema, No new Rash or bruise      Data Reviewed: I have personally reviewed following labs and imaging studies  CBC: Recent Labs  Lab 05/30/21 0531 05/31/21 0600 06/01/21 0345 06/03/21 0600 06/05/21 0530  WBC 10.3 10.7* 13.1* 9.2 9.2  HGB 10.0* 9.4* 10.0* 10.5* 9.9*  HCT 31.7* 30.4* 32.4* 33.8* 31.5*  MCV 96.4 97.4 98.2 98.8  98.7  PLT 204 197 215 203 629   Basic Metabolic Panel: Recent Labs  Lab 05/30/21 0531 05/31/21 0600 06/01/21 0345 06/03/21 0600 06/05/21 0530  NA 138 140 138 141 138  K 4.3 4.2 3.7 4.6 4.5  CL 99 101 100 103 98  CO2 34* 34* 32 33* 34*  GLUCOSE 97 96 177* 84 114*  BUN 30* 31* 30* 28* 26*  CREATININE 1.09 1.11 1.18 0.96 0.87  CALCIUM 8.7* 8.7* 8.7* 9.2 9.0  MG 1.9 2.1 1.8 2.0 1.9   GFR: Estimated Creatinine Clearance: 66.2 mL/min (by C-G formula based on SCr of 0.87 mg/dL). Liver Function Tests: No results for input(s): AST, ALT, ALKPHOS, BILITOT, PROT, ALBUMIN in the last 168 hours.  No results for input(s): LIPASE, AMYLASE in the last 168 hours. No results for input(s): AMMONIA in the last 168 hours. Coagulation Profile: No results for input(s): INR, PROTIME in the last 168 hours. Cardiac Enzymes: No results for input(s): CKTOTAL, CKMB, CKMBINDEX, TROPONINI in the last 168 hours. BNP (last 3 results) No results for input(s): PROBNP in the last 8760 hours. HbA1C: No  results for input(s): HGBA1C in the last 72 hours. CBG: No results for input(s): GLUCAP in the last 168 hours. Lipid Profile: No results for input(s): CHOL, HDL, LDLCALC, TRIG, CHOLHDL, LDLDIRECT in the last 72 hours. Thyroid Function Tests: No results for input(s): TSH, T4TOTAL, FREET4, T3FREE, THYROIDAB in the last 72 hours. Anemia Panel: No results for input(s): VITAMINB12, FOLATE, FERRITIN, TIBC, IRON, RETICCTPCT in the last 72 hours. Sepsis Labs: No results for input(s): PROCALCITON, LATICACIDVEN in the last 168 hours.  No results found for this or any previous visit (from the past 240 hour(s)).    Radiology Studies: No results found.   Scheduled Meds:  apixaban  5 mg Oral BID   arformoterol  15 mcg Nebulization BID   atorvastatin  40 mg Oral Daily   budesonide (PULMICORT) nebulizer solution  0.5 mg Nebulization BID   Chlorhexidine Gluconate Cloth  6 each Topical Daily   feeding supplement   237 mL Oral TID BM   FLUoxetine  40 mg Oral Daily   guaiFENesin  1,200 mg Oral BID   OLANZapine  10 mg Oral QHS   predniSONE  30 mg Oral Q breakfast   revefenacin  175 mcg Nebulization Daily   sodium chloride flush  10-40 mL Intracatheter Q12H   sulfamethoxazole-trimethoprim  1 tablet Oral Once per day on Mon Wed Fri   Continuous Infusions:  sodium chloride 10 mL/hr (05/13/21 1620)     LOS: 32 days     Phillips Climes, MD [facility] If 7PM-7AM, please contact night-coverage 06/05/2021, 2:18 PM

## 2021-06-06 ENCOUNTER — Telehealth: Payer: Self-pay | Admitting: Pulmonary Disease

## 2021-06-06 DIAGNOSIS — J849 Interstitial pulmonary disease, unspecified: Secondary | ICD-10-CM | POA: Diagnosis not present

## 2021-06-06 DIAGNOSIS — J9601 Acute respiratory failure with hypoxia: Secondary | ICD-10-CM | POA: Diagnosis not present

## 2021-06-06 NOTE — Progress Notes (Signed)
Occupational Therapy Treatment Patient Details Name: Jimmy West MRN: [id number] DOB: 11/12/1950 Today's Date: 06/06/2021   History of present illness 70 y.o. male was brought to hosp to admit 10/18 for hypoxia, weakness, SOB and noted sepsis from PNA, as well as NSTEMI.  Troponins are high from demand ischemia, EF 40-45%, and has recent asp PNA per chart.  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 Initially on 4L O2 at rest. Pt performed bed mobility c SUPV, STS c handheld assist, SBA to amb from EOB to sink. Pt stood at the sink for denture care and washing face/hands requiring VC for seated rest break x2 to support breath recovery when HR was up to mid 120's and SpO2 in low 80's. 4L O2 at rest, up to 10L O2 c exertion. Mod VC for PLB. Further education in strategies to promote recall and instucted in use of pulse oximeter to help pt self monitor HR/O2. Pt encouraged to continue use of incentive spirometer and flutter valve. Pt demonstrating improved tolerance with lower O2 needs, however, still requires up to 10L O2 with exertion as well as instruction and VC to utilize learned ECS strategies including rest breaks, activity pacing, and pursed lip breathing. Pt continues to benefit from skilled OT services. Consider transition home w/ HHOT pending additional progress and demonstrated tolerance on current O2 range.    Recommendations for follow up therapy are one component of a multi-disciplinary discharge planning process, led by the attending physician.  Recommendations may be updated based on patient status, additional functional criteria and insurance authorization.    Follow Up Recommendations  Other (comment) (Consider transition home w/ HHOT pending additional progress and demonstrated tolerance on  current O2 range)    Assistance Recommended at Discharge Intermittent Supervision/Assistance  Equipment Recommendations  BSC/3in1    Recommendations for Other Services      Precautions / Restrictions Precautions Precautions: Fall Precaution Comments: monitor sats and HR; mod fall risk Restrictions Weight Bearing Restrictions: No Other Position/Activity Restrictions: Watch SpO2       Mobility Bed Mobility Overal bed mobility: Needs Assistance Bed Mobility: Supine to Sit;Sit to Supine     Supine to sit: Supervision Sit to supine: Supervision        Transfers Overall transfer level: Needs assistance Equipment used: 1 person hand held assist   Sit to Stand: Min guard           General transfer comment: from std height bed, pt used L handheld assist for initial lift off     Balance Overall balance assessment: Needs assistance Sitting-balance support: Feet supported;No upper extremity supported Sitting balance-Leahy Scale: Good     Standing balance support: No upper extremity supported;During functional activity;Single extremity supported Standing balance-Leahy Scale: Fair                             ADL either performed or assessed with clinical judgement   ADL Overall ADL's : Needs assistance/impaired     Grooming: Wash/dry face;Cueing for compensatory techniques;Standing;Sitting;Oral care;Wash/dry hands;Supervision/safety Grooming Details (indicate cue type and reason): Pt stood at the sink for denture care and washing face/hands requiring VC for seated rest break x2 to support breath recovery when HR was up to mid 120's and SpO2  in low 80's. 4L O2 at rest, up to 10L O2 c exertion. Mod VC for PLB.                             Functional mobility during ADLs: Supervision/safety      Extremity/Trunk Assessment              Vision       Perception     Praxis      Cognition Arousal/Alertness: Awake/alert Behavior During  Therapy: WFL for tasks assessed/performed Overall Cognitive Status: Within Functional Limits for tasks assessed                                            Exercises Other Exercises Other Exercises: Continued moderate cues for PLB to support breath recovery after exertion, further education in strategies to promote recall and instucted in use of pulse oximeter to help pt self monitor HR/O2   Shoulder Instructions       General Comments      Pertinent Vitals/ Pain       Pain Assessment: No/denies pain  Home Living                                          Prior Functioning/Environment              Frequency  Min 2X/week        Progress Toward Goals  OT Goals(current goals can now be found in the care plan section)  Progress towards OT goals: Progressing toward goals  Acute Rehab OT Goals Patient Stated Goal: get better OT Goal Formulation: With patient Time For Goal Achievement: 06/18/21 Potential to Achieve Goals: Good  Plan Frequency remains appropriate;Other (comment) (Consider transition home w/ HHOT pending additional progress and demonstrated tolerance on current O2 range)    Co-evaluation                 AM-PAC OT "6 Clicks" Daily Activity     Outcome Measure   Help from another person eating meals?: None Help from another person taking care of personal grooming?: A Little Help from another person toileting, which includes using toliet, bedpan, or urinal?: A Little Help from another person bathing (including washing, rinsing, drying)?: A Little Help from another person to put on and taking off regular upper body clothing?: A Little Help from another person to put on and taking off regular lower body clothing?: A Little 6 Click Score: 19    End of Session Equipment Utilized During Treatment: Oxygen  OT Visit Diagnosis: Other abnormalities of gait and mobility (R26.89);Muscle weakness (generalized)  (M62.81);Unsteadiness on feet (R26.81)   Activity Tolerance Patient tolerated treatment well   Patient Left in bed;with call bell/phone within reach   Nurse Communication          Time: 0160-1093 OT Time Calculation (min): 27 min  Charges: OT General Charges $OT Visit: 1 Visit OT Treatments $Self Care/Home Management : 23-37 mins  Ardeth Perfect., MPH, MS, OTR/L ascom [phone] 06/06/21, 9:59 AM

## 2021-06-06 NOTE — Progress Notes (Signed)
PROGRESS NOTE    Jimmy West  LAG:[id number] DOB: Jul 20, 1951 DOA: 05/07/2021 PCP: Pcp, No  253A/253A-AA   Assessment & Plan:   Active Problems:   Primary cancer of right lower lobe of lung (HCC)   Acute hypoxemic respiratory failure (HCC)   SOB (shortness of breath)   Severe protein-calorie malnutrition (HCC)   Non-ST elevation (NSTEMI) myocardial infarction [facility])   71 year old M with PMH of stage IV RLL lung cancer, cancer pain, CAD/stent in 2005, chronic respiratory failure with hypoxia on 2 L, anemia of chronic disease, anxiety, depression and debility presenting with generalized weakness, shortness of breath and dry cough, and admitted for acute on chronic hypoxic respiratory failure in the setting of LLL pneumonia and non-STEMI.  Started on broad-spectrum antibiotics, steroid and IV heparin.  Cardiology consulted recommended medical management and [place] outpatient.  CTA chest negative for PE.   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 -I have discussed this plan with Dr. Patsey Berthold, likely he will be able to go home in 1 to 2 days on prolonged prednisone taper, will discharge on prednisone 30 mg oral daily, till he is seen by her regarding further tapering, so far no plan for IVIG during hospital stay, likely will need another dose of infliximab as an outpatient in few weeks..  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  Now back to baseline ~1. --Ensure oral hydration   Anemia, chronic, secondary to cancer treatment - stable --monitor Hgb   Elevated d-dimer  History PE on Eliquis - CTA Chest 10/18 negative for PE - High risk for PE recurrence due to underlying malignancy --cont Eliquis   Sepsis, ruled out --Pulm ruled out pneumonia, so no source of infection to qualify for sepsis.   NSTEMI history of CAD/stent in 2005: Troponin 3153 -> 3506 -> 2927 -> 2894.  EKG with T wave changes in lateral leads but no dynamic changes.  TTE with LVEF of 55 to 60%, G1-DD and no RWMA chest pain-free.  -Completed 48 hours of IV heparin and transition back to home Eliquis. -Medical management due to poor candidacy for heart cath in the setting of respiratory failure -Cardiology recommends outpatient follow-up when stable from respiratory standpoint.   Chronic diastolic CHF:  TTE as above.  Appears euvolemic on exam.  Does not seem to be on diuretics at home.    Hypotension: Resolved.   Stage IV lung cancer s/p radiation and chemotherapy previously.   --Dr. Rogue Bussing will see pt on Monday   Anemia of chronic disease:  H&H stable around 9's, after 1 unit --monitor Hgb   Chronic pain syndrome: --cont home oxycodone PRN   Depression and Anxiety: Stable -cont Fluoxetine and olanzapine --cont home Xanax, TID PRN, but ordered as q4h PRN so pt can have it at  the time intervals that he needs.   Physical deconditioning -uses rolling walker at baseline. -Therapy recommends SNF-cannot go until oxygen requirement does not go above 6L even with exertion. --pt encouraged to participate with PT/OT session when offered --go to the bathroom      Leukocytosis/bandemia -likely from steroid.   Goal  of care counseling -patient with comorbidity as above.  Very frail and deconditioned.  Poor long-term prognosis.  Appropriately DNR/DNI.  A candidate for hospice -Palliative medicine consulted.   DVT prophylaxis: BS:JGGEZMO Code Status: DNR  Family Communication:  Level of care: Med-Surg Dispo:   The patient is from: home Anticipated d/c is to: SNF Anticipated d/c date is: undetermined Patient currently is not medically ready to d/c due to: needs 10L with exertion   Subjective and Interval History:   No significant events overnight, he reports some dyspnea with activity, but report his feeling much better overall.   Objective: Vitals:   06/06/21 0428 06/06/21 0753 06/06/21 0812 06/06/21 1110  BP: 118/87  108/71 107/66  Pulse: (!) 103  89 93  Resp: 20  18 18   Temp: 98 F (36.7 C)  97.8 F (36.6 C) 97.9 F (36.6 C)  TempSrc: Oral     SpO2: (!) 88% 100% 100% 95%  Weight:      Height:        Intake/Output Summary (Last 24 hours) at 06/06/2021 1434 Last data filed at 06/06/2021 1345 Gross per 24 hour  Intake 960 ml  Output 1550 ml  Net -590 ml   Filed Weights   05/10/21 0901 05/10/21 1537 05/29/21 0343  Weight: 63.2 kg 64 kg 60.1 kg    Examination:   Awake Alert, Oriented X 3, No new F.N deficits, Normal affect Symmetrical Chest wall movement, Good air movement bilaterally, scattered rales RRR,No Gallops,Rubs or new Murmurs, No Parasternal Heave +ve B.Sounds, Abd Soft, No tenderness, No rebound - guarding or rigidity. No Cyanosis, Clubbing or edema, No new Rash or bruise       Data Reviewed: I have personally reviewed following labs and imaging studies  CBC: Recent Labs  Lab 05/31/21 0600 06/01/21 0345 06/03/21 0600 06/05/21 0530  WBC 10.7* 13.1* 9.2 9.2  HGB 9.4* 10.0* 10.5* 9.9*  HCT 30.4* 32.4* 33.8* 31.5*  MCV 97.4 98.2 98.8 98.7  PLT 197 215 203 294   Basic Metabolic Panel: Recent Labs  Lab 05/31/21 0600 06/01/21 0345 06/03/21 0600  06/05/21 0530  NA 140 138 141 138  K 4.2 3.7 4.6 4.5  CL 101 100 103 98  CO2 34* 32 33* 34*  GLUCOSE 96 177* 84 114*  BUN 31* 30* 28* 26*  CREATININE 1.11 1.18 0.96 0.87  CALCIUM 8.7* 8.7* 9.2 9.0  MG 2.1 1.8 2.0 1.9   GFR: Estimated Creatinine Clearance: 66.2 mL/min (by C-G formula based on SCr of 0.87 mg/dL). Liver Function Tests: No results for input(s): AST, ALT, ALKPHOS, BILITOT, PROT, ALBUMIN in the last 168 hours.  No results for input(s): LIPASE, AMYLASE in the last 168 hours. No results for input(s): AMMONIA in the last 168 hours. Coagulation Profile: No results for input(s): INR, PROTIME in the last 168 hours. Cardiac Enzymes: No results for input(s): CKTOTAL, CKMB, CKMBINDEX, TROPONINI in the last 168 hours. BNP (last 3 results) No results for input(s): PROBNP in the last 8760 hours. HbA1C: No results for input(s): HGBA1C in the last 72 hours. CBG: No results for input(s): GLUCAP in the last 168 hours. Lipid Profile: No results for input(s):  CHOL, HDL, LDLCALC, TRIG, CHOLHDL, LDLDIRECT in the last 72 hours. Thyroid Function Tests: No results for input(s): TSH, T4TOTAL, FREET4, T3FREE, THYROIDAB in the last 72 hours. Anemia Panel: No results for input(s): VITAMINB12, FOLATE, FERRITIN, TIBC, IRON, RETICCTPCT in the last 72 hours. Sepsis Labs: No results for input(s): PROCALCITON, LATICACIDVEN in the last 168 hours.  No results found for this or any previous visit (from the past 240 hour(s)).    Radiology Studies: No results found.   Scheduled Meds:  apixaban  5 mg Oral BID   arformoterol  15 mcg Nebulization BID   atorvastatin  40 mg Oral Daily   budesonide (PULMICORT) nebulizer solution  0.5 mg Nebulization BID   Chlorhexidine Gluconate Cloth  6 each Topical Daily   feeding supplement  237 mL Oral TID BM   FLUoxetine  40 mg Oral Daily   guaiFENesin  1,200 mg Oral BID   OLANZapine  10 mg Oral QHS   predniSONE  30 mg Oral Q breakfast   revefenacin  175  mcg Nebulization Daily   sodium chloride flush  10-40 mL Intracatheter Q12H   sulfamethoxazole-trimethoprim  1 tablet Oral Once per day on Mon Wed Fri   Continuous Infusions:  sodium chloride 10 mL/hr (05/13/21 1620)     LOS: 39 days     Phillips Climes, MD [facility] If 7PM-7AM, please contact night-coverage 06/06/2021, 2:34 PM

## 2021-06-06 NOTE — TOC Progression Note (Signed)
Transition of Care [facility]) - Progression Note    Patient Details  Name: Jimmy West MRN: [id number] Date of Birth: 1950/12/15  [facility] CM/SW [place], [place] Phone Number: 06/06/2021, 3:18 PM  Clinical Narrative:     CSW spoke with patient is agreeable to home health services and a 3in1, CSW sent [facility] referral to [facility] pending acceptance. [employer] to ship 3in1 to home per preference. [employer] aware of high O2 needs and to increase home set up. Plan for hopeful dc tomorrow home.   Expected Discharge Plan: Skilled Nursing Facility Barriers to Discharge: No Barriers Identified  Expected Discharge Plan and Services Expected Discharge Plan: [place] Choice: [place] arrangements for the past 2 months: Single Family Home                                       Social Determinants of Health (SDOH) Interventions    Readmission Risk Interventions Readmission Risk Prevention Plan 04/26/2021  Transportation Screening Complete  PCP or Specialist Appt within 3-5 Days Complete  HRI or [place] Complete  Social Work Consult for [place] Planning/Counseling Complete  Palliative Care Screening Not Applicable  Medication Review [employer]) Complete  Some recent data might be hidden

## 2021-06-06 NOTE — Progress Notes (Signed)
PT Cancellation Note  Patient Details Name: Jimmy West MRN: [id number] DOB: Nov 04, 1950   Cancelled Treatment:     PT attempt. Pt politely refused requesting. " Can you come back tomorrow?" I worked with OT earlier. Acute PT will continue to follow and progress as able per current POC. Author will return tomorrow per pt request.   [place] Pa 06/06/2021, 5:02 PM

## 2021-06-06 NOTE — Progress Notes (Signed)
NAME:  Jimmy West, MRN:  [id number], DOB:  15-Dec-1950, LOS: 27 ADMISSION DATE:  05/07/2021, INITIAL CONSULTATION DATE: 05/08/2021 REFERRING MD: Dr. Billie Ruddy ; follow-up requested by Dr. Dwyane Dee CHIEF COMPLAINT: Shortness of breath, bilateral infiltrates.  Follow-up requested from initial consultation on 08 May 2021.  Persistent respiratory failure with hypoxia.  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 Patient confirms DNR/DNI code status today and was followed by palliative care team during previous admission. He has one living brother who lives in [place] and has not spoken to him in years. His preferred contact person is [name] (information in patient records). Patient lives alone and does not have assistance at home.    Patient presented to the ER via EMS last night 10/18 with chief complaint of shortness of breath. Patient reports that he has not felt well since his hospital discharge with profound weakness, too weak to eat, and worsening shortness of breath. He reports that he started wearing his home oxygen all of the time due to not feeling well which helped his pulmonary symptoms some. He states he was not using his prescribed inhaler because the pump was malfunctioning on the handheld device. He also describes wheezing and dry, nonproductive cough which have worsened in the last several days. He denies recent fever, chills, sweating, nausea, vomiting, swelling. He reports small volume loose stools occasionally which is not his  routine bowel movements.    On arrival to the ER, patient with noted oxygen saturations in the 80s on room air by EMS with blood pressure systolic in the 75F after a 200cc fluid bolus. Vital signs on arrival pulse 97, BP 103/74, RR 36, oxygen saturations 91% on 6L, afebrile. Patient noted to easily desaturate with activity, speaking, and any interruption to his oxygen flow such as when he was transitioned to 8L high flow nasal cannula with humidifier. Labs noted WBC 16.6, hgb 9.9, Creatinine 1.71, BNP 1105, troponin 3153 and 3506 on repeat. COVID and flu screening negative. Imaging: CXR findings of new left lower lobe airspace process suspicious for pneumonia with extensive right lower lobe density consistent with radiation change. D-dimer was elevated, and CTA of the chest was done which was negative for PE. Patient had sepsis bundle initiated and received 2 liters LR fluid resuscitation, empiric vancomycin and cefepime in the ER. He was started on a heparin continuous infusion for NSTEMI suspected demand ischemia on history of CAD with remote PCI.    PCCM was consulted to assess patient. He is currently being seen in the ER pending bed placement inpatient, tolerating 8 liters highflow oxygen cannula at oxygen saturations 92-93%; however, he has noted increased work of breathing with accessory muscles of breathing use, shortness of breath worsening with conversation with short shallow breaths between words. Patient is alert, oriented to self, location, situation and time. He denies any other recent events other than what is described above. He has never used a BiPAP or other NIPPV support device before but is agreeable to trial on BiPAP for a short duration if his respiratory condition worsens. He confirms DNI status as part of  DNR and that he would not want intubation to support his breathing.   Hospital course from 19 October - 26 Oct : Patient has presented with acute on chronic respiratory failure with  hypoxia with CT imaging with extensive differential diagnosis.  He has been treated as "worsening pneumonia" with increasing FiO2 requirements in the process.  He is currently on steroids.  Had initial improvement on symptoms however, now has seemed to plateau.  We are asked to render opinion as to how long steroids will be necessary.  He is currently requiring high flow O2 at 60%.  Pertinent  Medical History   Stage IV adenocarcinoma of the lung Myocardial infarction in the past Remote history of PE on Eliquis CAD hx PCI 2005 Chronic anemia secondary to cancer treatment Port-a-cath right chest Recently discharged 04/28/21 after treatment of aspiration pneumonia  Significant Hospital Events: Including procedures, antibiotic start and stop dates in addition to other pertinent events   10/18: Brought by EMS to ER with cc: sob, hypoxic sats 80% on RA and hypotensive. CXR concerning for LUL pna, sepsis with NSTEMI demand ischemia on workup. Started on cefepime, vancomycin, heparin continuous infusion and assigned to hospitalist service pending bed placement.  10/19: Evaluated by PCCM significant pulm history with recurrent pneumonia ? Aspiration versus HCAP, sepsis, NSTEMI, hypotension. Stable sats on 8lpm highflow oxygen, with increased WOB and accessory muscle use.  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  On bubble high flow at 12 L/min 11/07: Continues to improve slowly.  O2 requirements down to 5 L/min.  Still with desaturations with activity 11/09:  Still on 5 L/min O2 however does desaturate significantly with ambulation. 11/17: Down to 3 L/min nasal cannula.  Still desaturates with ambulation.  Overall however, better.  Interim History / Subjective:  Feels he is "making progress".  No cough, no fevers.  Oxygen requirements have decreased now to 3 L/min via nasal cannula.  No conversational dyspnea.    Objective   Blood pressure 107/66, pulse 93, temperature 97.9 F (36.6 C), resp. rate 18, height 5' 6"  (1.676 m), weight 60.1 kg, SpO2 93 %.    SpO2: 93 % O2 Flow Rate (L/min): 3 L/min FiO2 (%): 45 %   Intake/Output Summary (Last 24 hours) at 06/06/2021 1639 Last data filed at 06/06/2021 1345 Gross per 24 hour  Intake 960 ml  Output 1550 ml  Net -590 ml    Filed Weights   05/10/21 0901 05/10/21 1537 05/29/21 0343  Weight: 63.2 kg 64 kg 60.1 kg    Examination: GENERAL: Chronically ill-appearing gentleman, resting in bed, on nasal cannula O2, comfortable, no conversational dyspnea noted today.   HEAD: Normocephalic, atraumatic.  EYES: Pupils equal, round, reactive to light.  No scleral icterus.  MOUTH: Oral mucosa moist. NECK: Supple. No thyromegaly. Trachea midline. No JVD.  No adenopathy. PULMONARY: Good air entry bilaterally.  Coarse throughout, no other adventitious sounds. CARDIOVASCULAR: S1 and S2. Regular rate and rhythm.  No rubs, murmurs or gallops heard. ABDOMEN: Scaphoid, otherwise benign. MUSCULOSKELETAL: No joint deformity, no clubbing, no edema.  Diffuse muscle wasting. NEUROLOGIC: No overt focal deficit, gait not tested.   SKIN: Intact,warm,dry. PSYCH: Conversant.  Mood and behavior normal.  Resolved Hospital Problem list   N/A  Assessment & Plan:  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 DDX: Steroid refractory ICI toxicity  (grade 3), lymphangitic spread of CA (not entirely ruled out) - Titrate oxygen supplementation to maintain O2 sats 88-92% - Has been titrated to 3 Lpm - Continue Brovana with Pulmicort twice a day via neb - Continue Yupelri once daily via neb - Continue prednisone at 30 mg daily (started 11/2), taper as O2 requirements decrease - PJP prophylaxis Bactrim DS Monday Wednesday Friday - Will need taper of steroids over 4 to 6-week period of time - Procalcitonin's have been negative, KL6 >> 1,866 (reference 0-500) - Fungitell negative - Immunoglobulins show modest decrease in IgG=458, no need for IVIG at present - Inflammatory markers are elevated (ESR/CRP), LDH 239 - Repeated CT chest 11/09, persistent interstitial and airspace process with no significant change - Significant clinical improvement, albeit slow - Received infliximab 5 mg/kg x 1, 11/02, repeat in 6 weeks x1    Pneumonia ruled out Procalcitonin is normal Persistent findings on CT with differential as above -No need for antibiotics at present except for PJP prophylaxis -Aspiration precautions -Leukocytosis >> resolved   ACUTE KIDNEY INJURY improving - Avoid nephrotoxic agents as able - Follow urine output, BMP - Monitor closely on PJP prophylaxis - Ensure adequate renal perfusion, optimize oxygenation  Anemia, chronic, secondary to cancer treatment - stable - Monitor routine CBC   Elevated d-dimer  History PE on Eliquis - CTA Chest 10/18 negative for PE - High risk for PE recurrence due to underlying malignancy - Continue Eliquis    Radiologic studies  Representative images of CT Chest 10/26 (L) and 11/09 (R):   Labs   CBC: Recent Labs  Lab 05/31/21 0600 06/01/21 0345 06/03/21 0600 06/05/21 0530  WBC 10.7* 13.1* 9.2 9.2  HGB 9.4* 10.0* 10.5* 9.9*  HCT 30.4* 32.4* 33.8* 31.5*  MCV 97.4 98.2 98.8 98.7  PLT 197 215 203 201     Basic Metabolic Panel: Recent Labs  Lab 05/31/21 0600 06/01/21 0345  06/03/21 0600 06/05/21 0530  NA 140 138 141 138  K 4.2 3.7 4.6 4.5  CL 101 100 103 98  CO2 34* 32 33* 34*  GLUCOSE 96 177* 84 114*  BUN 31* 30* 28* 26*  CREATININE 1.11 1.18 0.96 0.87  CALCIUM 8.7* 8.7* 9.2 9.0  MG 2.1 1.8 2.0 1.9    GFR: Estimated Creatinine Clearance: 66.2 mL/min (by C-G formula based on SCr of 0.87 mg/dL). Recent Labs  Lab 05/31/21 0600 06/01/21 0345 06/03/21 0600 06/05/21 0530  WBC 10.7* 13.1* 9.2 9.2     Liver Function Tests: No results for input(s): AST, ALT, ALKPHOS, BILITOT, PROT, ALBUMIN in the last 168 hours.  No results for input(s): LIPASE, AMYLASE in the last 168 hours. No results for input(s): AMMONIA in the last 168 hours.  ABG    Component Value Date/Time   PHART 7.51 (H) 05/14/2021 1459   PCO2ART 38 05/14/2021 1459   PO2ART 51 (L) 05/14/2021 1459   HCO3 30.3 (H) 05/14/2021 1459   O2SAT 89.4 05/14/2021 1459     CBG: No results for input(s): GLUCAP in the last 168 hours.   Review of Systems:  A 10 point review of systems was performed and it is as noted above otherwise negative.  Allergies No Known Allergies   Scheduled Meds:  apixaban  5 mg Oral BID   arformoterol  15 mcg Nebulization BID   atorvastatin  40 mg Oral Daily   budesonide (PULMICORT) nebulizer solution  0.5 mg Nebulization BID   Chlorhexidine Gluconate Cloth  6 each Topical Daily   feeding supplement  237 mL Oral TID BM   FLUoxetine  40 mg Oral Daily   guaiFENesin  1,200 mg Oral BID   OLANZapine  10 mg Oral QHS   predniSONE  30 mg Oral Q breakfast   revefenacin  175 mcg Nebulization Daily   sodium chloride flush  10-40 mL Intracatheter Q12H   sulfamethoxazole-trimethoprim  1 tablet Oral Once per day on Mon Wed Fri   Continuous Infusions:  sodium chloride 10 mL/hr (05/13/21 1620)   PRN Meds:.sodium chloride, acetaminophen, ALPRAZolam, alum & mag hydroxide-simeth, docusate sodium, ondansetron (ZOFRAN) IV, ondansetron, oxyCODONE, polyethylene glycol,  sodium chloride, sodium chloride flush Received infliximab x1 11/2  Level 2 follow-up    Discussion: Patient appears to have more of an interstitial process that is failing to improve this is either 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  At this point ICI induced pneumonitis more likely.  KL- 6 has been shown useful in determining ILD from other modalities, this is highly elevated which is more suggestive drug-induced ILD in this situation. Continue prednisone at decreased dose of 30 mg daily, started 11/2.  He will need a 4 to 6-week slow taper.  Continue to taper off as O2 requirements decrease.  Continue PJP prophylaxis (MWF).  Challenged with infliximab 5 mg/kg x 1 11/02.  Showing slow clinical improvement since then.  Continue nebulization treatments and pulmonary hygiene.  He does have modest decrease of IgG however, may be due to acute illness, no need for supplementation at present.  Will set up appointment with the patient in 2 to 3 weeks time at [facility].  He should continue follow-up at the cancer center as scheduled.  Will arrange with cancer center for second dose of infliximab in 6 weeks.  Discussed with Dr. Waldron Labs.   Renold Don, MD Advanced Bronchoscopy PCCM [facility]    *This note was dictated using voice recognition software/Dragon.  Despite best efforts to proofread, errors can occur which can change the meaning.  Any change was purely unintentional.

## 2021-06-06 NOTE — Telephone Encounter (Signed)
Per Dr. Patsey Berthold vis epic secure message--schedule HFU with herself or NP in 2-3 weeks. Appt scheduled for 07/02/2021 at 3:30. Will call patient with appt date/time once discharge.

## 2021-06-07 DIAGNOSIS — C3431 Malignant neoplasm of lower lobe, right bronchus or lung: Secondary | ICD-10-CM | POA: Diagnosis not present

## 2021-06-07 DIAGNOSIS — Z515 Encounter for palliative care: Secondary | ICD-10-CM | POA: Diagnosis not present

## 2021-06-07 DIAGNOSIS — Z79899 Other long term (current) drug therapy: Secondary | ICD-10-CM | POA: Diagnosis not present

## 2021-06-07 DIAGNOSIS — J9601 Acute respiratory failure with hypoxia: Secondary | ICD-10-CM | POA: Diagnosis not present

## 2021-06-07 DIAGNOSIS — J849 Interstitial pulmonary disease, unspecified: Secondary | ICD-10-CM | POA: Diagnosis not present

## 2021-06-07 DIAGNOSIS — R531 Weakness: Secondary | ICD-10-CM | POA: Diagnosis not present

## 2021-06-07 DIAGNOSIS — E876 Hypokalemia: Secondary | ICD-10-CM | POA: Diagnosis not present

## 2021-06-07 DIAGNOSIS — J189 Pneumonia, unspecified organism: Secondary | ICD-10-CM | POA: Diagnosis not present

## 2021-06-07 MED ORDER — MORPHINE SULFATE ER 15 MG PO TBCR
15.0000 mg | EXTENDED_RELEASE_TABLET | Freq: Three times a day (TID) | ORAL | Status: DC
  Administered 2021-06-07 – 2021-06-13 (×19): 15 mg via ORAL
  Filled 2021-06-07 (×19): qty 1

## 2021-06-07 MED ORDER — OXYCODONE HCL 5 MG PO TABS
10.0000 mg | ORAL_TABLET | Freq: Once | ORAL | Status: AC
  Administered 2021-06-07: 10 mg via ORAL
  Filled 2021-06-07: qty 2

## 2021-06-07 NOTE — Progress Notes (Signed)
[facility]  [phone] [phone]   Name: Jimmy West Date: 06/07/2021 MRN: [id number]  DOB: 1950-07-31  Patient Care Team: Pcp, No as PCP - General Kate Sable, MD as PCP - Cardiology (Cardiology) Telford Nab, RN as Registered Nurse Burlene Arnt, Gerda Diss, MD as Attending Physician (Emergency Medicine) Lebron Conners, [place] (Physician Assistant) Wonda Horner, MD as Referring Physician (Neurosurgery) Jason Coop, NP as Nurse Practitioner Cammie Sickle, MD as Consulting Physician (Hematology and Oncology)    REASON FOR CONSULTATION: Jimmy West is a 70 y.o. male with multiple medical problems including stage IV adenocarcinoma lung most recently on treatment with Alimta plus Keytruda.  Patient has had chronic neoplasm related pain and severe anxiety/depression and has been followed chronically in the palliative care clinic.  Patient was hospitalized 04/24/2021 -04/28/2021 and treated for possible aspiration pneumonia.  He was readmitted 05/07/2021 with hypoxic respiratory failure concerning for worsening pneumonia vs pneumonitis secondary to immunotherapy.  Palliative care is consulted to address goals.   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   *INTOLERANT to  carboplatin at an AUC of 5 as well as 2. # NGS/MOLECULAR TESTS:[employer] revealed no driver mutations  # PALLIATIVE CARE EVALUATION:  # PAIN MANAGEMENT:    DIAGNOSIS:   STAGE:         ;  GOALS:  CURRENT/MOST RECENT THERAPY :     Primary cancer of right lower lobe of lung ([place])  04/26/2018 Initial Diagnosis   Primary cancer of right lower lobe of lung ([place])   05/24/2018 - 01/10/2019 Chemotherapy   The patient had dexamethasone (DECADRON) 4 MG tablet, 1 of 1 cycle, Start date: 05/17/2018, End date: 08/12/2018 pembrolizumab (KEYTRUDA) 200 mg in sodium chloride 0.9 % 50 mL chemo infusion, 200 mg, Intravenous, Once, 11 of 14 cycles Administration: 200 mg (05/24/2018), 200 mg (07/16/2018), 200 mg (08/09/2018), 200 mg (08/30/2018), 200 mg (09/20/2018), 200 mg (06/14/2018), 200 mg (10/11/2018), 200 mg (11/08/2018), 200 mg (11/29/2018), 200 mg (12/20/2018), 200 mg (01/10/2019)   for chemotherapy treatment.     02/08/2019 - 04/12/2020 Chemotherapy   The patient had dexamethasone (DECADRON) 4 MG tablet, 1 of 1 cycle, Start date: 04/23/2020, End date: -- palonosetron (ALOXI) injection 0.25 mg, 0.25 mg, Intravenous,  Once, 21 of 23 cycles Administration: 0.25 mg (02/08/2019), 0.25 mg (03/29/2019), 0.25 mg (04/18/2019), 0.25 mg (02/28/2019), 0.25 mg (05/09/2019), 0.25 mg (05/30/2019), 0.25 mg (06/27/2019), 0.25 mg (07/18/2019), 0.25 mg (08/08/2019), 0.25 mg (08/30/2019), 0.25 mg (09/20/2019), 0.25 mg (10/11/2019), 0.25 mg (11/08/2019), 0.25 mg (11/29/2019), 0.25 mg (12/21/2019), 0.25 mg (01/18/2020), 0.25 mg (02/08/2020), 0.25 mg (02/29/2020), 0.25 mg (03/22/2020), 0.25 mg (04/11/2020) pegfilgrastim-jmdb (FULPHILA) injection 6 mg, 6 mg, Subcutaneous,  Once, 4 of 6 cycles Administration: 6 mg (03/23/2020), 6 mg (04/12/2020) PEMEtrexed (ALIMTA)  900 mg in sodium chloride 0.9 % 100 mL chemo infusion, 500 mg/m2 = 900 mg, Intravenous,  Once, 21 of 23 cycles Dose modification: 400 mg/m2 (original dose 500 mg/m2, Cycle 14, Reason: Provider  Judgment, Comment: change in reanl function, although CrCl > 45 ml/min) Administration: 900 mg (02/08/2019), 900 mg (03/29/2019), 900 mg (04/18/2019), 900 mg (05/09/2019), 900 mg (05/30/2019), 900 mg (02/28/2019), 900 mg (06/27/2019), 900 mg (07/18/2019), 900 mg (08/08/2019), 900 mg (08/30/2019), 900 mg (09/20/2019), 900 mg (10/11/2019), 900 mg (11/08/2019), 700 mg (11/29/2019), 700 mg (01/18/2020), 700 mg (02/08/2020), 700 mg (02/29/2020), 700 mg (12/21/2019), 700 mg (03/22/2020), 700 mg (04/11/2020) CARBOplatin (PARAPLATIN) 500 mg in sodium chloride 0.9 % 250 mL chemo infusion, 470 mg (100 % of original dose 472 mg), Intravenous,  Once, 2 of 2 cycles Dose modification:   (original dose 472 mg, Cycle 1) Administration: 500 mg (02/08/2019), 190 mg (04/18/2019) pembrolizumab (KEYTRUDA) 200 mg in sodium chloride 0.9 % 50 mL chemo infusion, 200 mg, Intravenous, Once, 21 of 23 cycles Administration: 200 mg (02/08/2019), 200 mg (03/29/2019), 200 mg (04/18/2019), 200 mg (05/09/2019), 200 mg (05/30/2019), 200 mg (02/28/2019), 200 mg (06/27/2019), 200 mg (07/18/2019), 200 mg (08/08/2019), 200 mg (08/30/2019), 200 mg (09/20/2019), 200 mg (10/11/2019), 200 mg (11/08/2019), 200 mg (11/29/2019), 200 mg (12/21/2019), 200 mg (01/18/2020), 200 mg (02/08/2020), 200 mg (02/29/2020), 200 mg (03/22/2020), 200 mg (04/11/2020) fosaprepitant (EMEND) 150 mg, dexamethasone (DECADRON) 12 mg in sodium chloride 0.9 % 145 mL IVPB, , Intravenous,  Once, 4 of 4 cycles Administration:  (02/08/2019),  (04/18/2019)   for chemotherapy treatment.     07/10/2020 -  Chemotherapy   Patient is on Treatment Plan : LUNG Maintenance Pemetrexed + Pembrolizumab       ALLERGIES:  has No Known Allergies.  MEDICATIONS:  Current Facility-Administered Medications  Medication Dose Route Frequency Provider Last Rate Last Admin   0.9 %  sodium chloride infusion   Intravenous PRN Gwynne Edinger, MD 10 mL/hr at 05/13/21 1620 10 mL/hr at 05/13/21 1620   acetaminophen (TYLENOL) tablet 1,000  mg  1,000 mg Oral TID PRN Val Riles, MD   1,000 mg at 06/03/21 2156   ALPRAZolam (XANAX) tablet 1 mg  1 mg Oral Q4H PRN Enzo Bi, MD   1 mg at 06/07/21 1054   alum & mag hydroxide-simeth (MAALOX/MYLANTA) 200-200-20 MG/5ML suspension 30 mL  30 mL Oral Q6H PRN Val Riles, MD   30 mL at 05/18/21 1640   apixaban (ELIQUIS) tablet 5 mg  5 mg Oral BID Theora Gianotti, NP   5 mg at 06/07/21 0857   arformoterol (BROVANA) nebulizer solution 15 mcg  15 mcg Nebulization BID Tyler Pita, MD   15 mcg at 06/07/21 0711   atorvastatin (LIPITOR) tablet 40 mg  40 mg Oral Daily Theora Gianotti, NP   40 mg at 06/07/21 0857   budesonide (PULMICORT) nebulizer solution 0.5 mg  0.5 mg Nebulization BID Flora Lipps, MD   0.5 mg at 06/07/21 7829   Chlorhexidine Gluconate Cloth 2 % PADS 6 each  6 each Topical Daily Raiford [place], DO   6 each at 06/07/21 0901   docusate sodium (COLACE) capsule 100 mg  100 mg Oral BID PRN Enzo Bi, MD       feeding supplement (ENSURE ENLIVE / ENSURE PLUS) liquid 237 mL  237 mL Oral TID BM Enzo Bi, MD   237 mL at 06/07/21 1447   FLUoxetine (PROZAC) capsule 40 mg  40 mg Oral Daily  Enzo Bi, MD   40 mg at 06/07/21 0858   guaiFENesin (MUCINEX) 12 hr tablet 1,200 mg  1,200 mg Oral BID Narda Rutherford, RPH   1,200 mg at 06/07/21 0858   morphine (MS CONTIN) 12 hr tablet 15 mg  15 mg Oral Q8H Shayaan Parke, Kirt Boys, NP       OLANZapine (ZYPREXA) tablet 10 mg  10 mg Oral QHS Enzo Bi, MD   10 mg at 06/06/21 2138   ondansetron (ZOFRAN) injection 4 mg  4 mg Intravenous Q6H PRN Enzo Bi, MD       ondansetron (ZOFRAN-ODT) disintegrating tablet 4 mg  4 mg Oral Q8H PRN Enzo Bi, MD       oxyCODONE (Oxy IR/ROXICODONE) immediate release tablet 10-15 mg  10-15 mg Oral Q4H PRN Enzo Bi, MD   15 mg at 06/07/21 0858   polyethylene glycol (MIRALAX / GLYCOLAX) packet 17 g  17 g Oral BID PRN Enzo Bi, MD       predniSONE (DELTASONE) tablet 30 mg  30 mg Oral Q breakfast  Tyler Pita, MD   30 mg at 06/07/21 0857   revefenacin (YUPELRI) nebulizer solution 175 mcg  175 mcg Nebulization Daily Tyler Pita, MD   175 mcg at 06/07/21 0711   sodium chloride (OCEAN) 0.65 % nasal spray 1 spray  1 spray Each Nare PRN Wendee Beavers T, MD       sodium chloride flush (NS) 0.9 % injection 10-40 mL  10-40 mL Intracatheter Q12H Lorella Nimrod, MD   10 mL at 06/07/21 0858   sodium chloride flush (NS) 0.9 % injection 10-40 mL  10-40 mL Intracatheter PRN Lorella Nimrod, MD       sulfamethoxazole-trimethoprim (BACTRIM DS) 800-160 MG per tablet 1 tablet  1 tablet Oral Once per day on Mon Wed Fri Tyler Pita, MD   1 tablet at 06/07/21 2924   Facility-Administered Medications Ordered in Other Encounters  Medication Dose Route Frequency Provider Last Rate Last Admin   0.9 %  sodium chloride infusion   Intravenous Continuous Cammie Sickle, MD   Stopped at 03/19/21 [place]: BP 100/67 (BP Location: Right Arm)   Pulse 94   Temp 97.8 F (36.6 C) (Oral)   Resp 18   Ht 5' 6"  (1.676 m)   Wt 132 lb 7.9 oz (60.1 kg)   SpO2 96%   BMI 21.39 kg/m  Filed Weights   05/10/21 0901 05/10/21 1537 05/29/21 0343  Weight: 139 lb 5.3 oz (63.2 kg) 141 lb 1.5 oz (64 kg) 132 lb 7.9 oz (60.1 kg)    Estimated body mass index is 21.39 kg/m as calculated from the following:   Height as of this encounter: 5' 6"  (1.676 m).   Weight as of this encounter: 132 lb 7.9 oz (60.1 kg).  LABS: CBC:    Component Value Date/Time   WBC 9.2 06/05/2021 0530   HGB 9.9 (L) 06/05/2021 0530   HGB 14.9 04/06/2014 0826   HCT 31.5 (L) 06/05/2021 0530   HCT 44.8 04/06/2014 0826   PLT 201 06/05/2021 0530   PLT 238 04/06/2014 0826   MCV 98.7 06/05/2021 0530   MCV 95 04/06/2014 0826   NEUTROABS 14.0 (H) 05/11/2021 0555   NEUTROABS 3.9 04/06/2014 0826   LYMPHSABS 0.2 (L) 05/11/2021 0555   LYMPHSABS 1.1 04/06/2014 0826   MONOABS 0.3 05/11/2021 0555   MONOABS 0.7 04/06/2014 0826    EOSABS 0.0 05/11/2021 0555  EOSABS 0.1 04/06/2014 0826   BASOSABS 0.0 05/11/2021 0555   BASOSABS 0.1 04/06/2014 0826   Comprehensive Metabolic Panel:    Component Value Date/Time   NA 138 06/05/2021 0530   NA 140 04/06/2014 0826   K 4.5 06/05/2021 0530   K 4.0 04/06/2014 0826   CL 98 06/05/2021 0530   CL 108 (H) 04/06/2014 0826   CO2 34 (H) 06/05/2021 0530   CO2 25 04/06/2014 0826   BUN 26 (H) 06/05/2021 0530   BUN 12 04/06/2014 0826   CREATININE 0.87 06/05/2021 0530   CREATININE 1.17 04/06/2014 0826   GLUCOSE 114 (H) 06/05/2021 0530   GLUCOSE 106 (H) 04/06/2014 0826   CALCIUM 9.0 06/05/2021 0530   CALCIUM 8.9 04/06/2014 0826   AST 20 05/11/2021 0555   AST 24 04/06/2014 0826   ALT 19 05/11/2021 0555   ALT 33 04/06/2014 0826   ALKPHOS 74 05/11/2021 0555   ALKPHOS 85 04/06/2014 0826   BILITOT 0.8 05/11/2021 0555   BILITOT 0.8 04/06/2014 0826   PROT 5.5 (L) 05/11/2021 0555   PROT 7.3 04/06/2014 0826   ALBUMIN 2.6 (L) 05/29/2021 0510   ALBUMIN 3.8 04/06/2014 0826    RADIOGRAPHIC STUDIES: DG Chest 2 View  Result Date: 05/28/2021 CLINICAL DATA:  Difficulty breathing EXAM: CHEST - 2 VIEW COMPARISON:  Previous studies including the chest radiograph done on 05/24/2021 FINDINGS: Transverse diameter of heart is increased. Increased markings are seen in parahilar regions and lower lung fields, more so on the right side. There is decreased volume in both lungs. Lateral CP angles are clear. There is no pneumothorax. Tip central venous catheter is seen in right atrium. IMPRESSION: Increased interstitial and alveolar markings in both lungs suggest pulmonary fibrosis and possibly superimposed pneumonitis. No significant interval changes are noted since 05/24/2021. Electronically Signed   By: Elmer Picker M.D.   On: 05/28/2021 08:27   DG Chest 2 View  Result Date: 05/13/2021 CLINICAL DATA:  Shortness of breath.  History of lung cancer. EXAM: CHEST - 2 VIEW COMPARISON:  May 11, 2021. FINDINGS: Stable cardiomediastinal silhouette. Increased bilateral basilar airspace opacities are noted concerning for worsening pneumonia. Right internal jugular Port-A-Cath is unchanged in position. Old upper thoracic compression fracture is noted. IMPRESSION: Increased bibasilar airspace opacities are noted concerning for worsening pneumonia. Electronically Signed   By: Marijo Conception M.D.   On: 05/13/2021 14:12   CT CHEST WO CONTRAST  Result Date: 05/29/2021 CLINICAL DATA:  70 year old male with history of acute interstitial pneumonitis. Shortness of breath. History of lung cancer. EXAM: CT CHEST WITHOUT CONTRAST TECHNIQUE: Multidetector CT imaging of the chest was performed following the standard protocol without IV contrast. COMPARISON:  Chest CT 05/15/2021. FINDINGS: Cardiovascular: Heart size is enlarged with left ventricular dilatation, most notably in the apex where there appears to be mild aneurysmal dilatation, as well as areas of dystrophic calcification, presumably from prior distal LAD territory myocardial infarction(s). There is no significant pericardial fluid, thickening or pericardial calcification. There is aortic atherosclerosis, as well as atherosclerosis of the great vessels of the mediastinum and the coronary arteries, including calcified atherosclerotic plaque in the left main, left anterior descending and right coronary arteries. Right internal jugular single-lumen porta cath with tip terminating in the right atrium. Mediastinum/Nodes: No pathologically enlarged mediastinal or hilar lymph nodes. Please note that accurate exclusion of hilar adenopathy is limited on noncontrast CT scans. Esophagus is unremarkable in appearance. No axillary lymphadenopathy. Lungs/Pleura: Widespread but patchy areas of airspace consolidation, ground-glass  attenuation, septal thickening, thickening of the peribronchovascular interstitium, cylindrical and varicose bronchiectasis, and regional areas of  architectural distortion are again noted throughout the lungs bilaterally. Overall, aeration appears slightly worsened, particularly in the lower lobes of the lungs bilaterally. Treated right lower lobe neoplasm is obscured by the evolving parenchymal process in the lungs. There is also diffuse bronchial wall thickening with moderate centrilobular and paraseptal emphysema. Trace right pleural effusion, unchanged. No definite left pleural effusion Upper Abdomen: Aortic atherosclerosis. Exophytic low-attenuation lesion in the upper pole the right kidney measuring 2.1 cm in diameter, incompletely characterized on today's non-contrast CT examination, but similar to the prior study and statistically likely to represent a cyst. Musculoskeletal: Chronic compression fracture of T4 with vertebral plana appearance, similar to the prior examination. There are no aggressive appearing lytic or blastic lesions noted in the visualized portions of the skeleton. IMPRESSION: . 1. Overall, there is been slight worsened aeration in the lower lungs when compared to the recent prior examination. Findings could be consistent with reported clinical history of acute interstitial pneumonitis. 2. Aortic atherosclerosis, in addition to left main and 2 vessel coronary artery disease. Assessment for potential risk factor modification, dietary therapy or pharmacologic therapy may be warranted, if clinically indicated. 3. Diffuse bronchial wall thickening with moderate centrilobular and paraseptal emphysema; imaging findings suggestive of underlying COPD. Aortic Atherosclerosis (ICD10-I70.0) and Emphysema (ICD10-J43.9). Electronically Signed   By: Vinnie Langton M.D.   On: 05/29/2021 11:10   CT CHEST WO CONTRAST  Result Date: 05/15/2021 CLINICAL DATA:  Unresolving Pulmonary infiltrates, R/O lymphangitic spread, pt has AdenoCA lung EXAM: CT CHEST WITHOUT CONTRAST TECHNIQUE: Multidetector CT imaging of the chest was performed following the  standard protocol without IV contrast. COMPARISON:  Multiple priors FINDINGS: Cardiovascular: Normal heart size. No pericardial effusion. Thoracic aorta is normal in caliber with scattered atherosclerotic calcifications. The proximal pulmonary arteries are normal in caliber. There is a right-sided chest port with central venous catheter entering the right internal jugular vein and tip terminating near the superior cavoatrial junction. Mediastinum/Nodes: No enlarged mediastinal, hilar, or axillary lymph nodes. The thyroid gland appears normal. Lungs/Pleura: Right pleural thickening without significant effusion appreciated. No pneumothorax. Extensive centrilobular emphysematous changes in the lungs. Again seen is the right lower lobe mass which measures up to 2.6 cm, essentially unchanged since previous exam. Again seen are similar distribution and severity of bilateral pulmonary opacities, which can be described as more coarse interstitial opacities with ground-glass opacity in the inferior aspect of both the left upper and lower lobes, and more confluent peribronchiolar consolidation in the right lower lobe. Musculoskeletal: No aggressive osseous lesions. Unchanged severe compression fracture the T4 vertebral body. Upper abdomen: The visualized upper abdomen is unremarkable. IMPRESSION: Essentially stable appearance of the lungs including right lower lobe mass and bilateral interstitial and peribronchialar consolidative opacities as described in a background of extensive emphysematous changes. Although nonspecific, findings are favored to represent multifocal infectious process versus inflammatory pneumonitis in the context of immunotherapy administration, and less likely lymphangitic carcinomatosis. Electronically Signed   By: Albin Felling M.D.   On: 05/15/2021 15:00   DG Chest Port 1 View  Result Date: 05/24/2021 CLINICAL DATA:  Respiratory failure, shortness of breath EXAM: PORTABLE CHEST 1 VIEW COMPARISON:   Previous studies including the examination of 05/19/2021 FINDINGS: Transverse diameter of heart is increased. Patchy interstitial and alveolar densities are seen in the parahilar regions and lower lung fields, more so on the right side with no significant interval change. There is  no pneumothorax. Tip of chest port catheter is seen in right atrium. IMPRESSION: Cardiomegaly. Interstitial and alveolar densities in both mid and both lower lung fields have not changed. Electronically Signed   By: Elmer Picker M.D.   On: 05/24/2021 09:53   DG Chest Port 1 View  Result Date: 05/19/2021 CLINICAL DATA:  Shortness of breath.  History of lung cancer. EXAM: PORTABLE CHEST 1 VIEW COMPARISON:  May 14, 2021. FINDINGS: The heart size and mediastinal contours are within normal limits. Right internal jugular Port-A-Cath is unchanged in position. Stable bilateral lower lobe airspace opacities are noted concerning for pneumonia. No pneumothorax is noted. The visualized skeletal structures are unremarkable. IMPRESSION: Stable bilateral lower lobe airspace opacities are noted concerning for pneumonia. Electronically Signed   By: Marijo Conception M.D.   On: 05/19/2021 13:27   DG Chest Port 1 View  Result Date: 05/14/2021 CLINICAL DATA:  Two a 70 year old male presents with pneumonia. EXAM: PORTABLE CHEST 1 VIEW COMPARISON:  May 13, 2021 and multiple prior studies. FINDINGS: RIGHT-sided Port-A-Cath again terminates in the RIGHT atrium. EKG leads project over the chest. Cardiomediastinal contours and hilar structures are stable. Persistent interstitial and airspace opacities at the lung bases with slight interval improvement. No new areas of airspace disease. On limited assessment there is no acute skeletal process. IMPRESSION: Persistent interstitial and airspace opacities at the lung bases with slight interval improvement. Remains concerning for multifocal pneumonia or pneumonitis. Known pulmonary mass not well  evaluated. Electronically Signed   By: Zetta Bills M.D.   On: 05/14/2021 15:44   DG Chest Port 1 View  Result Date: 05/11/2021 CLINICAL DATA:  Shortness of breath in a 70 year old male with history of pulmonary neoplasm. EXAM: PORTABLE CHEST 1 VIEW COMPARISON:  Most recent chest x-ray from May 07, 2021 and CT of the chest from May 07, 2021. FINDINGS: Diffuse interstitial and airspace opacities in the mid and lower lungs as on the recent chest x-ray. RIGHT-sided Port-A-Cath terminates in the RIGHT atrium. Cardiomediastinal contours are stable. No visible pneumothorax.  No gross effusion. On limited assessment there is no acute skeletal process. IMPRESSION: Diffuse interstitial and airspace opacities in the mid and lower lungs, similar to the recent chest x-ray. May be post infectious fibrotic changes or ongoing pneumonia. Would also correlate with any risk factors for pneumonitis that could be related to ongoing therapy. Electronically Signed   By: Zetta Bills M.D.   On: 05/11/2021 08:14    PERFORMANCE STATUS (ECOG) : 4 - Bedbound  Review of Systems Unless otherwise noted, a complete review of systems is negative.  Physical Exam General: Frail appearing Pulmonary: Exertionally labored Extremities: no edema, no joint deformities Skin: no rashes Neurological: Weakness but otherwise nonfocal  IMPRESSION: I met with patient to discuss goals.  Patient has made slow progress with O2 requirements but remains dyspneic with minimal exertion. He tells me that he does not know if he will "make it out of the hospital" and asked me to prognosticate. We discussed his limited treatment options given adverse effects from immunotherapy. I recommended that he consider hospice support. His preference would be to go home with hospice care.  Unfortunately, patient lives at home alone and has limited social support.  I spoke with patient's friend, [name], about the recommendation for hospice.  She  was in agreement with this but was unclear if she and other friends would be able to provide enough care in addition to hospice to support patient at home.  She  wanted to speak with patient over the weekend about this.   Symptomatically, patient continues to have severe and persistent back pain.  He has been receiving frequent dosing of oxycodone (85 mg in past 24 hours).  We will start patient on MS Contin every 8 hours to provide him with longer acting pain control.  PLAN: -Best supportive care -Recommend hospice at home if friends can provide him with 24/7 care -Start MS Contin 15 mg every 8 hours -Continue oxycodone as needed for breakthrough pain  Case and plan discussed with Dr. Rogue Bussing  Time Total: 45 minutes  Visit consisted of counseling and education dealing with the complex and emotionally intense issues of symptom management and palliative care in the setting of serious and potentially life-threatening illness.Greater than 50%  of this time was spent counseling and coordinating care related to the above assessment and plan.  Signed by: [name], PhD, NP-C

## 2021-06-07 NOTE — Progress Notes (Signed)
PT Cancellation Note  Patient Details Name: Jimmy West MRN: [id number] DOB: 1951-03-21   Cancelled Treatment:     PT attempt, 2nd attempt today. RN in room giving pain medicine, then MD (palliative) arrived. Author will return shortly and PT will continue to follow per current POC.    Willette Pa 06/07/2021, 11:39 AM

## 2021-06-07 NOTE — Progress Notes (Signed)
Physical Therapy Treatment Patient Details Name: Jimmy West MRN: [id number] DOB: 1951/04/10 Today's Date: 06/07/2021   History of Present Illness 70 y.o. male was brought to hosp to admit 10/18 for hypoxia, weakness, SOB and noted sepsis from PNA, as well as NSTEMI.  Troponins are high from demand ischemia, EF 40-45%, and has recent asp PNA per chart.  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 He agrees to session and is cooperative throughout. Continues to be severely limited by respiratory response to activity. He was able to ambulate without physical assistance however required O2 to be elevated to 15 L to maintain sao2 > 88%. Prolonged recovery time. PT will continue to follow and progress as able per current POC.    Recommendations for follow up therapy are one component of a multi-disciplinary discharge planning process, led by the attending physician.  Recommendations may be updated based on patient status, additional functional criteria and insurance authorization.  Follow Up Recommendations  Skilled nursing-short term rehab (<3 hours/day)     Assistance Recommended at Discharge Intermittent Supervision/Assistance  Equipment Recommendations  None recommended by PT       Precautions / Restrictions Precautions Precautions: Fall Precaution Comments: monitor sats and HR; mod fall risk Restrictions Weight Bearing Restrictions: No Other Position/Activity Restrictions: Watch SpO2     Mobility  Bed Mobility Overal bed mobility: Needs Assistance Bed Mobility: Supine to Sit;Sit to Supine     Supine to sit: Supervision Sit to supine: Supervision        Transfers Overall transfer level: Needs assistance Equipment used: Rolling walker (2 wheels) Transfers: Sit  to/from Stand Sit to Stand: Supervision           General transfer comment: did require vcs for proper handplacement and increased fwd wt shift prior to standing    Ambulation/Gait Ambulation/Gait assistance: Supervision Gait Distance (Feet): 45 Feet Assistive device: Rolling walker (2 wheels) Gait Pattern/deviations: Step-through pattern Gait velocity: decreased     General Gait Details: pt was able to ambulate 2 x to dorrway and back without physical assistance however required increased o2 to 15 L to maintain > 88%. prolonged recover time   Balance Overall balance assessment: Needs assistance Sitting-balance support: Feet supported;No upper extremity supported Sitting balance-Leahy Scale: Good     Standing balance support: No upper extremity supported;During functional activity;Single extremity supported Standing balance-Leahy Scale: Fair Standing balance comment: good balance with BUE support      Cognition Arousal/Alertness: Awake/alert Behavior During Therapy: WFL for tasks assessed/performed Overall Cognitive Status: Within Functional Limits for tasks assessed      General Comments: pt is A and O x 4 and conversational and pleasant throughout               Pertinent Vitals/Pain Pain Assessment: 0-10 Pain Score: 4  Pain Location: back Pain Descriptors / Indicators: Aching;Pressure Pain Intervention(s): Limited activity within patient's tolerance;Monitored during session;Premedicated before session     PT Goals (current goals can now be found in the care plan section) Acute Rehab PT Goals Patient Stated Goal: to go home Progress towards PT goals: Progressing toward goals    Frequency    Min 2X/week      PT Plan Current plan remains appropriate       AM-PAC  PT "6 Clicks" Mobility   Outcome Measure  Help needed turning from your back to your side while in a flat bed without using bedrails?: None Help needed moving from lying on your back to  sitting on the side of a flat bed without using bedrails?: A Little Help needed moving to and from a bed to a chair (including a wheelchair)?: A Little Help needed standing up from a chair using your arms (e.g., wheelchair or bedside chair)?: A Little Help needed to walk in hospital room?: A Little Help needed climbing 3-5 steps with a railing? : A Little 6 Click Score: 19    End of Session Equipment Utilized During Treatment: Oxygen Activity Tolerance: Patient tolerated treatment well Patient left: in bed;with call bell/phone within reach;with bed alarm set Nurse Communication: Mobility status PT Visit Diagnosis: Muscle weakness (generalized) (M62.81);Difficulty in walking, not elsewhere classified (R26.2);Unsteadiness on feet (R26.81)     Time: 1914-7829 PT Time Calculation (min) (ACUTE ONLY): 23 min  Charges:  $Gait Training: 8-22 mins $Therapeutic Activity: 8-22 mins                     Julaine Fusi PTA 06/07/21, 1:07 PM

## 2021-06-07 NOTE — Progress Notes (Signed)
[place] [facility]) Hospital Liaison Note  Received request from Transitions of Care Manager [place], [place], for hospice services at home after discharge. Chart and patient information reviewed by [facility] physician. Hospice eligibility confirmed.  Spoke with friend [name] to initiate education related to hospice philosophy, services and team approach to care. Patient/family verbalized understanding of information provided.  DME needs discussed. Patient has the following equipment in the home: oxygen, rollator, lift chair, BSC, w/c. No other DME needs at this time.  Please send signed and completed DNR home with patient/family. Please provide prescriptions at discharge as needed to ensure ongoing symptom management.   ACC information and contact numbers given to family. Above information shared with [place].   Please do not hesitate to call with any hospice related questions or concerns.   Thank you for the opportunity to participate in this patient's care.   Bobbie "Loren Racer, RN, BSN [facility] Liaison [phone]

## 2021-06-07 NOTE — TOC Transition Note (Signed)
Transition of Care [facility]) - CM/SW Discharge Note   Patient Details  Name: Jimmy West MRN: [id number] Date of Birth: [birth year]  Transition [facility] Care [facility]) CM/SW Contact:  Alberteen Sam, LCSW Phone Number: 06/07/2021, 9:28 AM   Clinical Narrative:     Patient to discharge today home with home health services. CSW notes that [place] with [facility] has accepted patient for all services of [facility] PT OT RN aide and social worker and can see patient tomorrow 11/19. Patient will receive 3in1 that was ordered via [employer] yesterday 11/17 and will be shipped to patient's house. Patient will receive refresher course from [employer] for his oxygen needs and they will exchange his current 5L home concentrator to 10L home concentrator upon discharge.   Patient plans to transport home via EMS. CSW will call EMS once discharge orders/paperwork completed by MD.   No other discharge needs identified at this time.   Final next level of care: [place] Barriers to Discharge: No Barriers Identified   Patient Goals and CMS Choice Patient states their goals for this hospitalization and ongoing recovery are:: to go home CMS Medicare.gov Compare Post Acute Care list provided to:: Patient Choice offered to / list presented to : Patient  Discharge Placement                Patient to be transferred to facility by: [facility]   Patient and family notified of of transfer: 06/07/21  Discharge Plan and Services     Post Acute Care Choice: [place]          DME Arranged: 3-N-1 DME Agency: [employer] Date DME Agency Contacted: 06/06/21     HH Arranged: PT, OT, RN, Nurse's Aide, Social Work [employer] Agency: [place] Date [place] Agency Contacted: 06/07/21 Time [place]: [phone] Representative spoke with at [place]: [place]  Social Determinants of Health ([place]) Interventions     Readmission Risk Interventions Readmission Risk Prevention Plan 04/26/2021  Transportation  Screening Complete  PCP or Specialist Appt within 3-5 Days Complete  HRI or [place] Complete  Social Work Consult for [place] Planning/Counseling Complete  Palliative Care Screening Not Applicable  Medication Review Press photographer) Complete  Some recent data might be hidden

## 2021-06-07 NOTE — TOC Progression Note (Signed)
Transition of Care [facility]) - Progression Note    Patient Details  Name: Jimmy West MRN: [id number] Date of Birth: [birth year]  Transition of Care [facility]) CM/SW [place], [place] Phone Number: 06/07/2021, 2:48 PM  Clinical Narrative:     CSW spoke with patient's friend should he decide to go home with hospice, per palliative patient is open to this idea.   CSW spoke with [name] and discussed support at home and she said herself, mike, and another friend plans on working out a schedule to provide support for the patient at home with hospice, she did report he has a lift chair at home and was curious if they need to work on getting him a ramp. I did inform her we have a bedside commode being delivered to his home.   She was in agreement with [facility] referral being made and them calling her discuss the plan for home with hospice.    Expected Discharge Plan: Skilled Nursing Facility Barriers to Discharge: No Barriers Identified  Expected Discharge Plan and Services Expected Discharge Plan: [place] Choice: [place] arrangements for the past 2 months: Single Family Home                 DME Arranged: 3-N-1 DME Agency: [employer] Date DME Agency Contacted: 06/06/21     HH Arranged: PT, OT, RN, Nurse's Aide, Social Work [employer] Agency: [place] Date [place]: 06/07/21 Time [place]: [phone] Representative spoke with at [place]: [place]   Social Determinants of Health ([place]) Interventions    Readmission Risk Interventions Readmission Risk Prevention Plan 04/26/2021  Transportation Screening Complete  PCP or Specialist Appt within 3-5 Days Complete  HRI or [place] Complete  Social Work Consult for [place] Planning/Counseling Complete  Palliative Care Screening Not Applicable  Medication Review Press photographer) Complete  Some recent data might be  hidden

## 2021-06-07 NOTE — Care Management Important Message (Signed)
Important Message  Patient Details  Name: Jimmy West MRN: [id number] Date of Birth: October 27, 1950   Medicare Important Message Given:  Yes     Dannette Barbara 06/07/2021, 2:25 PM

## 2021-06-07 NOTE — Progress Notes (Signed)
PROGRESS NOTE    Jimmy West  RCB:[id number] DOB: [birth year] DOA: 05/07/2021 PCP: Pcp, No  253A/253A-AA   Assessment & Plan:   Active Problems:   Primary cancer of right lower lobe of lung (HCC)   Acute hypoxemic respiratory failure (HCC)   SOB (shortness of breath)   Severe protein-calorie malnutrition (HCC)   Non-ST elevation (NSTEMI) myocardial infarction [facility])   70 year old M with PMH of stage IV RLL lung cancer, cancer pain, CAD/stent in 2005, chronic respiratory failure with hypoxia on 2 L, anemia of chronic disease, anxiety, depression and debility presenting with generalized weakness, shortness of breath and dry cough, and admitted for acute on chronic hypoxic respiratory failure in the setting of LLL pneumonia and non-STEMI.  Started on broad-spectrum antibiotics, steroid and IV heparin.  Cardiology consulted recommended medical management and [place] outpatient.  CTA chest negative for PE.   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 -Discussed with pulmonary Dr. Patsey Berthold, plan to continue with prolonged prednisone taper, consideration for infliximab as an outpatient -Patient respiratory status is very tenuous, unable to be discharged to SNF, so likely plan is to go home either with home health or hospice with maximum support   Stage IV lung cancer s/p radiation and chemotherapy previously.   -- Patient is following with Dr. Rogue Bussing  -He was seen this morning  by oncology, he is not a good candidate for any further immunotherapy, he has very poor tolerance to chemotherapy, it is reasonable to consider home with hospice on discharge. -Palliative medicine assisting with pain control, adding MS Contin to his current as needed oxycodone regimen.  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  Now back to baseline ~1. --Ensure oral hydration   Anemia, chronic, secondary to cancer treatment - stable --monitor Hgb   Elevated d-dimer  History PE on Eliquis - CTA Chest 10/18 negative for PE - High risk for PE recurrence due to underlying malignancy --cont Eliquis   Sepsis, ruled out --Pulm ruled out pneumonia, so no source of infection to qualify for sepsis.   NSTEMI history of CAD/stent in 2005: Troponin 3153 -> 3506 -> 2927 -> 2894.  EKG with T wave changes in lateral leads but no dynamic changes.  TTE with LVEF of 55 to 60%, G1-DD and no RWMA chest pain-free.  -Completed 48 hours of IV heparin and transition back to home Eliquis. -Medical management due to poor candidacy for heart cath in the setting of respiratory failure -Cardiology recommends outpatient follow-up when stable from respiratory standpoint.   Chronic diastolic CHF:  TTE as above.  Appears euvolemic on exam.  Does not seem to be on diuretics at home.    Hypotension: Resolved.    Anemia of chronic disease:  H&H stable around 9's, after 1 unit --monitor Hgb   Chronic pain syndrome: --cont home oxycodone PRN  Depression and Anxiety: Stable -cont Fluoxetine and olanzapine --cont home Xanax, TID PRN, but ordered as q4h PRN so pt can have it at the time intervals that he needs.   Physical deconditioning -uses rolling walker at baseline. -Therapy recommends SNF-cannot go until oxygen  requirement does not go above 6L even with exertion. --pt encouraged to participate with PT/OT session when offered --go to the bathroom      Leukocytosis/bandemia -likely from steroid.   Goal of care counseling -patient with comorbidity as above.  Very frail and deconditioned.  Poor long-term prognosis.  Appropriately DNR/DNI.  A candidate for hospice -Palliative medicine consulted.   DVT prophylaxis: XT:[id number] Code Status: DNR  Family Communication:  Level of care: Med-Surg Dispo:   The patient is from: home Anticipated d/c is to: Likely home with home hospice Anticipated d/c date is: undetermined Patient currently is not medically ready to d/c due to: needs 10L with exertion   Subjective and Interval History:   No significant events overnight, remains with very poor tolerance to activity  Objective: Vitals:   06/07/21 0011 06/07/21 0714 06/07/21 0800 06/07/21 1300  BP: (!) 111/92  106/75 100/67  Pulse: 99  90 94  Resp: 18  18 18   Temp: 98.6 F (37 C)  97.7 F (36.5 C) 97.8 F (36.6 C)  TempSrc:    Oral  SpO2: (!) 88% 96% 93% 96%  Weight:      Height:        Intake/Output Summary (Last 24 hours) at 06/07/2021 1650 Last data filed at 06/07/2021 1404 Gross per 24 hour  Intake 960 ml  Output 2240 ml  Net -1280 ml   Filed Weights   05/10/21 0901 05/10/21 1537 05/29/21 0343  Weight: 63.2 kg 64 kg 60.1 kg    Examination:   Awake Alert, Oriented X 3, No new F.N deficits, Normal affect Symmetrical Chest wall movement, Good air movement bilaterally, scattered Rales RRR,No Gallops,Rubs or new Murmurs, No Parasternal Heave +ve B.Sounds, Abd Soft, No tenderness, No rebound - guarding or rigidity. No Cyanosis, Clubbing or edema, No new Rash or bruise        Data Reviewed: I have personally reviewed following labs and imaging studies  CBC: Recent Labs  Lab 06/01/21 0345 06/03/21 0600 06/05/21 0530  WBC 13.1* 9.2 9.2  HGB 10.0* 10.5* 9.9*  HCT 32.4*  33.8* 31.5*  MCV 98.2 98.8 98.7  PLT 215 203 532   Basic Metabolic Panel: Recent Labs  Lab 06/01/21 0345 06/03/21 0600 06/05/21 0530  NA 138 141 138  K 3.7 4.6 4.5  CL 100 103 98  CO2 32 33* 34*  GLUCOSE 177* 84 114*  BUN 30* 28* 26*  CREATININE 1.18 0.96 0.87  CALCIUM 8.7* 9.2 9.0  MG 1.8 2.0 1.9   GFR: Estimated Creatinine Clearance: 66.2 mL/min (by C-G formula based on SCr of 0.87 mg/dL). Liver Function Tests: No results for input(s): AST, ALT, ALKPHOS, BILITOT, PROT, ALBUMIN in the last 168 hours.  No results for input(s): LIPASE, AMYLASE in the last 168 hours. No results for input(s): AMMONIA in the last 168 hours. Coagulation Profile: No results for input(s): INR, PROTIME in the last 168 hours. Cardiac Enzymes: No results for input(s): CKTOTAL, CKMB, CKMBINDEX, TROPONINI in the last 168 hours. BNP (last 3 results) No results for input(s): PROBNP in the last 8760 hours. HbA1C: No results for input(s): HGBA1C in the last 72 hours. CBG: No results for input(s): GLUCAP in the last 168 hours. Lipid Profile:  No results for input(s): CHOL, HDL, LDLCALC, TRIG, CHOLHDL, LDLDIRECT in the last 72 hours. Thyroid Function Tests: No results for input(s): TSH, T4TOTAL, FREET4, T3FREE, THYROIDAB in the last 72 hours. Anemia Panel: No results for input(s): VITAMINB12, FOLATE, FERRITIN, TIBC, IRON, RETICCTPCT in the last 72 hours. Sepsis Labs: No results for input(s): PROCALCITON, LATICACIDVEN in the last 168 hours.  No results found for this or any previous visit (from the past 240 hour(s)).    Radiology Studies: No results found.   Scheduled Meds:  apixaban  5 mg Oral BID   arformoterol  15 mcg Nebulization BID   atorvastatin  40 mg Oral Daily   budesonide (PULMICORT) nebulizer solution  0.5 mg Nebulization BID   Chlorhexidine Gluconate Cloth  6 each Topical Daily   feeding supplement  237 mL Oral TID BM   FLUoxetine  40 mg Oral Daily   guaiFENesin  1,200 mg Oral  BID   morphine  15 mg Oral Q8H   OLANZapine  10 mg Oral QHS   predniSONE  30 mg Oral Q breakfast   revefenacin  175 mcg Nebulization Daily   sodium chloride flush  10-40 mL Intracatheter Q12H   sulfamethoxazole-trimethoprim  1 tablet Oral Once per day on Mon Wed Fri   Continuous Infusions:  sodium chloride 10 mL/hr (05/13/21 1620)     LOS: 40 days     Phillips Climes, MD [facility] If 7PM-7AM, please contact night-coverage 06/07/2021, 4:50 PM

## 2021-06-07 NOTE — Final Progress Note (Signed)
NAME:  Jimmy West, MRN:  [id number], DOB:  1951-03-23, LOS: 31 ADMISSION DATE:  05/07/2021, INITIAL CONSULTATION DATE: 05/08/2021 REFERRING MD: Dr. Billie Ruddy ; follow-up requested by Dr. Dwyane Dee CHIEF COMPLAINT: Shortness of breath, bilateral infiltrates.  Follow-up requested from initial consultation on 08 May 2021.  Persistent respiratory failure with hypoxia.  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 Patient confirms DNR/DNI code status today and was followed by palliative care team during previous admission. He has one living brother who lives in [place] and has not spoken to him in years. His preferred contact person is [name] (information in patient records). Patient lives alone and does not have assistance at home.    Patient presented to the ER via EMS last night 10/18 with chief complaint of shortness of breath. Patient reports that he has not felt well since his hospital discharge with profound weakness, too weak to eat, and worsening shortness of breath. He reports that he started wearing his home oxygen all of the time due to not feeling well which helped his pulmonary symptoms some. He states he was not using his prescribed inhaler because the pump was malfunctioning on the handheld device. He also describes wheezing and dry, nonproductive cough which have worsened in the last several days. He denies recent fever, chills, sweating, nausea, vomiting, swelling. He reports small volume loose stools occasionally which is not his  routine bowel movements.    On arrival to the ER, patient with noted oxygen saturations in the 80s on room air by EMS with blood pressure systolic in the 69S after a 200cc fluid bolus. Vital signs on arrival pulse 97, BP 103/74, RR 36, oxygen saturations 91% on 6L, afebrile. Patient noted to easily desaturate with activity, speaking, and any interruption to his oxygen flow such as when he was transitioned to 8L high flow nasal cannula with humidifier. Labs noted WBC 16.6, hgb 9.9, Creatinine 1.71, BNP 1105, troponin 3153 and 3506 on repeat. COVID and flu screening negative. Imaging: CXR findings of new left lower lobe airspace process suspicious for pneumonia with extensive right lower lobe density consistent with radiation change. D-dimer was elevated, and CTA of the chest was done which was negative for PE. Patient had sepsis bundle initiated and received 2 liters LR fluid resuscitation, empiric vancomycin and cefepime in the ER. He was started on a heparin continuous infusion for NSTEMI suspected demand ischemia on history of CAD with remote PCI.    PCCM was consulted to assess patient. He is currently being seen in the ER pending bed placement inpatient, tolerating 8 liters highflow oxygen cannula at oxygen saturations 92-93%; however, he has noted increased work of breathing with accessory muscles of breathing use, shortness of breath worsening with conversation with short shallow breaths between words. Patient is alert, oriented to self, location, situation and time. He denies any other recent events other than what is described above. He has never used a BiPAP or other NIPPV support device before but is agreeable to trial on BiPAP for a short duration if his respiratory condition worsens. He confirms DNI status as part of  DNR and that he would not want intubation to support his breathing.   Hospital course from 19 October - 26 Oct : Patient has presented with acute on chronic respiratory failure with  hypoxia with CT imaging with extensive differential diagnosis.  He has been treated as "worsening pneumonia" with increasing FiO2 requirements in the process.  He is currently on steroids.  Had initial improvement on symptoms however, now has seemed to plateau.  We are asked to render opinion as to how long steroids will be necessary.  He is currently requiring high flow O2 at 60%.  Pertinent  Medical History   Stage IV adenocarcinoma of the lung Myocardial infarction in the past Remote history of PE on Eliquis CAD hx PCI 2005 Chronic anemia secondary to cancer treatment Port-a-cath right chest Recently discharged 04/28/21 after treatment of aspiration pneumonia  Significant Hospital Events: Including procedures, antibiotic start and stop dates in addition to other pertinent events   10/18: Brought by EMS to ER with cc: sob, hypoxic sats 80% on RA and hypotensive. CXR concerning for LUL pna, sepsis with NSTEMI demand ischemia on workup. Started on cefepime, vancomycin, heparin continuous infusion and assigned to hospitalist service pending bed placement.  10/19: Evaluated by PCCM significant pulm history with recurrent pneumonia ? Aspiration versus HCAP, sepsis, NSTEMI, hypotension. Stable sats on 8lpm highflow oxygen, with increased WOB and accessory muscle use.  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  On bubble high flow at 12 L/min 11/07: Continues to improve slowly.  O2 requirements down to 5 L/min.  Still with desaturations with activity 11/09:  Still on 5 L/min O2 however does desaturate significantly with ambulation. 11/17: Down to 3 L/min nasal cannula.  Still desaturates with ambulation.  Overall however, better. 11/18: Still very dyspneic but oxygen requirement still between 3 to 4 L/min.  Interim History / Subjective:  Feels he is "making progress".  No cough, no fevers.  Oxygen requirements have decreased now to 3 to 4 L/min via nasal cannula.  No conversational dyspnea.    Objective   Blood pressure 100/67, pulse 94, temperature 97.8 F (36.6 C), temperature source Oral, resp. rate 18, height 5\' 6"  (1.676 m), weight 60.1 kg, SpO2 96 %.    SpO2: 96 % O2 Flow Rate (L/min): 3 L/min FiO2 (%): 45 %   Intake/Output Summary (Last 24 hours) at 06/07/2021 1705 Last data filed at 06/07/2021 1404 Gross per 24 hour  Intake 960 ml  Output 2240 ml  Net -1280 ml    Filed Weights   05/10/21 0901 05/10/21 1537 05/29/21 0343  Weight: 63.2 kg 64 kg 60.1 kg    Examination: GENERAL: Chronically ill-appearing gentleman, resting in bed, on nasal cannula O2, comfortable, no conversational dyspnea noted today.   HEAD: Normocephalic, atraumatic.  EYES: Pupils equal, round, reactive to light.  No scleral icterus.  MOUTH: Oral mucosa moist. NECK: Supple. No thyromegaly. Trachea midline. No JVD.  No adenopathy. PULMONARY: Good air entry bilaterally.  Coarse throughout, no other adventitious sounds. CARDIOVASCULAR: S1 and S2. Regular rate and rhythm.  No  rubs, murmurs or gallops heard. ABDOMEN: Scaphoid, otherwise benign. MUSCULOSKELETAL: No joint deformity, no clubbing, no edema.  Diffuse muscle wasting. NEUROLOGIC: No overt focal deficit, gait not tested.   SKIN: Intact,warm,dry. PSYCH: Conversant.  Mood and behavior normal.  Resolved Hospital Problem list   N/A  Assessment & Plan:  Acute on chronic hypoxic respiratory failure Interstitial and airspace process both lungs Stage IV adenocarcinoma of the lung with mets Underlying  COPD DDX: Steroid refractory ICI toxicity (grade 3) - Titrate oxygen supplementation to maintain O2 sats 88-92% - For the management of COPD simplification of regimen upon discharge recommend DuoNeb every 6h - Continue prednisone at 30 mg daily (started 11/2), taper as O2 requirements decrease - PJP prophylaxis Bactrim DS Monday Wednesday Friday - Will need taper of steroids over 4 to 6-week period of time - Significant clinical improvement, albeit slow - Received infliximab 5 mg/kg x 1, 11/02, repeat in 6 weeks x1 - Has follow-up appointment at [facility] on 13 December at 3:30 PM ([facility] [place])    Pneumonia ruled out Procalcitonin is normal Persistent findings on CT with differential as above -No need for antibiotics at present except for PJP prophylaxis -Aspiration precautions -Leukocytosis >> resolved   ACUTE KIDNEY INJURY improving - Avoid nephrotoxic agents as able - Follow urine output, BMP - Monitor closely on PJP prophylaxis - Ensure adequate renal perfusion, optimize oxygenation  Anemia, chronic, secondary to cancer treatment - stable - Monitor routine CBC   Elevated d-dimer  History PE on Eliquis - CTA Chest 10/18 negative for PE - High risk for PE recurrence due to underlying malignancy - Continue Eliquis    Radiologic studies  Representative images of CT Chest 10/26 (L) and 11/09 (R):   Labs   CBC: Recent Labs  Lab 06/01/21 0345 06/03/21 0600 06/05/21 0530  WBC 13.1* 9.2 9.2  HGB 10.0* 10.5* 9.9*  HCT 32.4* 33.8* 31.5*  MCV 98.2 98.8 98.7  PLT 215 203 201     Basic Metabolic Panel: Recent Labs  Lab 06/01/21 0345 06/03/21 0600 06/05/21 0530  NA 138 141 138  K 3.7 4.6 4.5  CL 100 103 98  CO2 32 33* 34*  GLUCOSE 177* 84 114*  BUN 30* 28* 26*  CREATININE 1.18 0.96 0.87  CALCIUM 8.7* 9.2 9.0  MG 1.8 2.0 1.9    GFR: Estimated Creatinine Clearance: 66.2 mL/min (by C-G formula based on SCr of 0.87 mg/dL). Recent Labs  Lab  06/01/21 0345 06/03/21 0600 06/05/21 0530  WBC 13.1* 9.2 9.2     Liver Function Tests: No results for input(s): AST, ALT, ALKPHOS, BILITOT, PROT, ALBUMIN in the last 168 hours.  No results for input(s): LIPASE, AMYLASE in the last 168 hours. No results for input(s): AMMONIA in the last 168 hours.  ABG    Component Value Date/Time   PHART 7.51 (H) 05/14/2021 1459   PCO2ART 38 05/14/2021 1459   PO2ART 51 (L) 05/14/2021 1459   HCO3 30.3 (H) 05/14/2021 1459   O2SAT 89.4 05/14/2021 1459     CBG: No results for input(s): GLUCAP in the last 168 hours.   Review of Systems:   A 10 point review of systems was performed and it is as noted above otherwise negative.  Allergies No Known Allergies   Scheduled Meds:  apixaban  5 mg Oral BID   arformoterol  15 mcg Nebulization BID   atorvastatin  40 mg Oral Daily   budesonide (PULMICORT) nebulizer solution  0.5 mg Nebulization BID  Chlorhexidine Gluconate Cloth  6 each Topical Daily   feeding supplement  237 mL Oral TID BM   FLUoxetine  40 mg Oral Daily   guaiFENesin  1,200 mg Oral BID   morphine  15 mg Oral Q8H   OLANZapine  10 mg Oral QHS   predniSONE  30 mg Oral Q breakfast   revefenacin  175 mcg Nebulization Daily   sodium chloride flush  10-40 mL Intracatheter Q12H   sulfamethoxazole-trimethoprim  1 tablet Oral Once per day on Mon Wed Fri   Continuous Infusions:  sodium chloride 10 mL/hr (05/13/21 1620)   PRN Meds:.sodium chloride, acetaminophen, ALPRAZolam, alum & mag hydroxide-simeth, docusate sodium, ondansetron (ZOFRAN) IV, ondansetron, oxyCODONE, polyethylene glycol, sodium chloride, sodium chloride flush Received infliximab x1 11/2  Level 2 follow-up    Discussion: Patient appears to have more of an interstitial process that is failing to improve this is either 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  At this point ICI induced  pneumonitis more likely.  KL- 6 has been shown useful in determining ILD from other modalities, this is highly elevated which is more suggestive drug-induced ILD in this situation.  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  This can also be fatal.  Even after improvement the patient may have another flare.  Cannot have ICI treatment again.  Continue prednisone at 30 mg daily, started 11/2.  He will need a 4 to 6-week slow taper.  Continue to taper off as O2 requirements decrease.  Continue PJP prophylaxis (MWF).  Challenged with infliximab 5 mg/kg x 1 11/02.  Showing slow clinical improvement since then.  Continue nebulization treatments and pulmonary hygiene.  He does have modest decrease of IgG however, may be due to acute illness, no need for supplementation at present.  Appointment at [facility] for follow-up on 13 December at 3:30 PM.  I have reviewed Dr. Aletha Halim notes and the patient has also reiterated that he is considering discharge to hospice which would not be unreasonable at this point given that he cannot receive chemotherapy or immunotherapy and has stage IV cancer.   PCCM will sign off, please reconsult as needed.   Renold Don, MD Advanced Bronchoscopy PCCM [facility]    *This note was dictated using voice recognition software/Dragon.  Despite best efforts to proofread, errors can occur which can change the meaning.  Any change was purely unintentional.

## 2021-06-07 NOTE — Progress Notes (Signed)
Jimmy West   DOB:Sep 02, 1950   ZO#:[id number]    Subjective: Patient resting in the bed.  Patient has been weaned off from high flow nasal oxygen.  Is currently on nasal cannula oxygen at  6 lit.  Patient continues to have difficulty with shortness of breath on ambulation.  Objective:  Vitals:   06/07/21 0800 06/07/21 1300  BP: 106/75 100/67  Pulse: 90 94  Resp: 18 18  Temp: 97.7 F (36.5 C) 97.8 F (36.6 C)  SpO2: 93% 96%     Intake/Output Summary (Last 24 hours) at 06/07/2021 1632 Last data filed at 06/07/2021 1404 Gross per 24 hour  Intake 960 ml  Output 2240 ml  Net -1280 ml    Physical Exam Vitals and nursing note reviewed.  HENT:     Head: Normocephalic and atraumatic.     Mouth/Throat:     Pharynx: Oropharynx is clear.  Eyes:     Extraocular Movements: Extraocular movements intact.     Pupils: Pupils are equal, round, and reactive to light.  Cardiovascular:     Rate and Rhythm: Normal rate and regular rhythm.  Pulmonary:     Comments: Decreased breath sounds bilaterally.  Abdominal:     Palpations: Abdomen is soft.  Musculoskeletal:        General: Normal range of motion.     Cervical back: Normal range of motion.  Skin:    General: Skin is warm.  Neurological:     General: No focal deficit present.     Mental Status: He is alert and oriented to person, place, and time.  Psychiatric:        Behavior: Behavior normal.        Judgment: Judgment normal.     Labs:  Lab Results  Component Value Date   WBC 9.2 06/05/2021   HGB 9.9 (L) 06/05/2021   HCT 31.5 (L) 06/05/2021   MCV 98.7 06/05/2021   PLT 201 06/05/2021   NEUTROABS 14.0 (H) 05/11/2021    Lab Results  Component Value Date   NA 138 06/05/2021   K 4.5 06/05/2021   CL 98 06/05/2021   CO2 34 (H) 06/05/2021    Studies:  No results found.  Primary cancer of right lower lobe of lung [facility]) #70 year old male patient history of COPD/chronic respiratory failure-metastatic lung cancer  currently on immunotherapy is currently admitted hospital for worsening respiratory distress/diagnosed with pneumonia.  #Right lower lobe lung cancer-adenocarcinoma currently on immunotherapy/Keytruda.  Discussed with the patient that given patient's pneumonitis is likely from [place] will discontinue in immunotherapy further.  See discussion below  #Acute on chronic respiratory failure / bilateral lower lobe pneumonitis-likely secondary to immunotherapy.  S/p infliximab x1 dose-notes improvement in the respiratory status.  Continue steroid/taper-based on respiratory status over the next 4 to 6 weeks.  Appreciate evaluation with Dr. Patsey Berthold.  Prognosis/disposition: Patient's prognosis unfortunately is poor.  Patient is not a candidate for any further immunotherapy.  He has extremely poor tolerance to chemotherapy.   I think it is reasonable to consider home with hospice at discharge.  Patient unfortunately has very poor social support.  I discussed with the patient he is in agreement.  Also discussed with [employer].   Cammie Sickle, MD 06/07/2021  4:32 PM

## 2021-06-08 DIAGNOSIS — J849 Interstitial pulmonary disease, unspecified: Secondary | ICD-10-CM | POA: Diagnosis not present

## 2021-06-08 NOTE — TOC Progression Note (Signed)
Transition of Care [facility]) - Progression Note    Patient Details  Name: Jimmy West MRN: [id number] Date of Birth: 1951-03-24  Transition of Care [facility]) CM/SW [place], [place] Phone Number: 06/08/2021, 4:04 PM  Clinical Narrative:     CSW discussed with MD that plan right now is for patient to dc home with hospice however ideal dc plan would be to a facility. Unfortunately patient has oxygen needs exceeding SNF requirements, however CSW has reached out to [facility] to see if patient can qualify for hospice home, pending response at this time.    Expected Discharge Plan: Skilled Nursing Facility Barriers to Discharge: No Barriers Identified  Expected Discharge Plan and Services Expected Discharge Plan: [place] Choice: [place] arrangements for the past 2 months: Single Family Home                 DME Arranged: 3-N-1 DME Agency: [employer] Date DME Agency Contacted: 06/06/21     HH Arranged: PT, OT, RN, Nurse's Aide, Social Work [employer] Agency: [place] Date [place]: 06/07/21 Time [place]: [phone] Representative spoke with at [place]: [place]   Social Determinants of Health ([place]) Interventions    Readmission Risk Interventions Readmission Risk Prevention Plan 04/26/2021  Transportation Screening Complete  PCP or Specialist Appt within 3-5 Days Complete  HRI or [place] Complete  Social Work Consult for [place] Planning/Counseling Complete  Palliative Care Screening Not Applicable  Medication Review Press photographer) Complete  Some recent data might be hidden

## 2021-06-08 NOTE — Progress Notes (Signed)
PROGRESS NOTE    Jimmy West  OZH:[id number] DOB: 1951-06-11 DOA: 05/07/2021 PCP: Pcp, No  253A/253A-AA   Assessment & Plan:   Active Problems:   Primary cancer of right lower lobe of lung (HCC)   Acute hypoxemic respiratory failure (HCC)   SOB (shortness of breath)   Severe protein-calorie malnutrition (HCC)   Non-ST elevation (NSTEMI) myocardial infarction [facility])   70 year old M with PMH of stage IV RLL lung cancer, cancer pain, CAD/stent in 2005, chronic respiratory failure with hypoxia on 2 L, anemia of chronic disease, anxiety, depression and debility presenting with generalized weakness, shortness of breath and dry cough, and admitted for acute on chronic hypoxic respiratory failure in the setting of LLL pneumonia and non-STEMI.  Started on broad-spectrum antibiotics, steroid and IV heparin.  Cardiology consulted recommended medical management and [place] outpatient.  CTA chest negative for PE.   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 -Discussed with pulmonary Dr. Patsey Berthold, plan to continue with prolonged prednisone taper, consideration for infliximab as an outpatient -Patient respiratory status is very tenuous, social worker is assisting with disposition plan, as well palliative medicine, and with poor social support system as no family here, will evaluating most appropriate disposition plan, either home with hospice versus facility with hospice.   -GI input greatly  appreciated, patient is hospice appropriate, as he is with stage IV lung cancer, and not a candidate for either immunotherapy or chemotherapy.    Stage IV lung cancer s/p radiation and chemotherapy previously.   -- Patient is following with Dr. Rogue Bussing  -He was seen this morning by oncology, he is not a good candidate for any further immunotherapy, he has very poor tolerance to chemotherapy, it is reasonable to consider home with hospice on discharge. -Palliative medicine assisting with pain control, adding MS Contin to his current as needed oxycodone regimen.  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  Now back to baseline ~1. --Ensure oral hydration   Anemia, chronic, secondary to cancer treatment - stable --monitor Hgb   Elevated d-dimer  History PE on Eliquis - CTA Chest 10/18 negative for PE - High risk for PE recurrence due to underlying malignancy --cont Eliquis   Sepsis, ruled out --Pulm ruled out pneumonia, so no source of infection to qualify for sepsis.   NSTEMI history of CAD/stent in 2005: Troponin 3153 -> 3506 -> 2927 -> 2894.  EKG with T wave changes in lateral leads but no dynamic changes.  TTE with LVEF of 55 to 60%, G1-DD and no RWMA chest pain-free.  -Completed 48 hours of IV heparin and transition back to home Eliquis. -Medical management due to poor candidacy for heart cath in the setting of respiratory failure -Cardiology recommends outpatient follow-up when stable from respiratory standpoint.   Chronic diastolic CHF:  TTE as above.  Appears euvolemic on exam.  Does  not seem to be on diuretics at home.    Hypotension: Resolved.    Anemia of chronic disease:  H&H stable around 9's, after 1 unit --monitor Hgb   Chronic pain syndrome: --cont home oxycodone  PRN   Depression and Anxiety: Stable -cont Fluoxetine and olanzapine --cont home Xanax, TID PRN, but ordered as q4h PRN so pt can have it at the time intervals that he needs.   Physical deconditioning -uses rolling walker at baseline. -Therapy recommends SNF-cannot go until oxygen requirement does not go above 6L even with exertion. --pt encouraged to participate with PT/OT session when offered --go to the bathroom      Leukocytosis/bandemia -likely from steroid.   Goal of care counseling -patient with comorbidity as above.  Very frail and deconditioned.  Poor long-term prognosis.  Appropriately DNR/DNI.  A candidate for hospice -Palliative medicine consulted.   DVT prophylaxis: JY:[id number] Code Status: DNR  Family Communication:  Level of care: Med-Surg Dispo:   The patient is from: home Anticipated d/c is to: Likely home with home hospice Anticipated d/c date is: undetermined Patient currently is not medically ready to d/c due to: needs 10L with exertion   Subjective and Interval History:   No significant events overnight, remains with very poor tolerance to activity, reports his pain is better controlled after adding MS Contin.  Objective: Vitals:   06/08/21 0720 06/08/21 0728 06/08/21 0730 06/08/21 1139  BP:   118/75 103/69  Pulse:   80 (!) 102  Resp:   18 17  Temp:   97.7 F (36.5 C) 97.7 F (36.5 C)  TempSrc:    Oral  SpO2: 96% 96% 100% 93%  Weight:      Height:        Intake/Output Summary (Last 24 hours) at 06/08/2021 1459 Last data filed at 06/08/2021 1401 Gross per 24 hour  Intake --  Output 2025 ml  Net -2025 ml   Filed Weights   05/10/21 0901 05/10/21 1537 05/29/21 0343  Weight: 63.2 kg 64 kg 60.1 kg    Examination:   Awake Alert, Oriented X 3, No new F.N deficits, Trembly frail, chronically ill-appearing male laying in bed . Symmetrical Chest wall movement, Good air movement bilaterally, CTAB RRR,No Gallops,Rubs or new Murmurs, No  Parasternal Heave +ve B.Sounds, Abd Soft, No tenderness, No rebound - guarding or rigidity. No Cyanosis, Clubbing or edema, No new Rash or bruise         Data Reviewed: I have personally reviewed following labs and imaging studies  CBC: Recent Labs  Lab 06/03/21 0600 06/05/21 0530  WBC 9.2 9.2  HGB 10.5* 9.9*  HCT 33.8* 31.5*  MCV 98.8 98.7  PLT 203 130   Basic Metabolic Panel: Recent Labs  Lab 06/03/21 0600 06/05/21 0530  NA 141 138  K 4.6 4.5  CL 103 98  CO2 33* 34*  GLUCOSE 84 114*  BUN 28* 26*  CREATININE 0.96 0.87  CALCIUM 9.2 9.0  MG 2.0 1.9   GFR: Estimated Creatinine Clearance: 66.2 mL/min (by C-G formula based on SCr of 0.87 mg/dL). Liver Function Tests: No results for input(s): AST, ALT, ALKPHOS, BILITOT, PROT, ALBUMIN in the last 168 hours.  No results for input(s): LIPASE, AMYLASE in the last 168 hours. No results for input(s): AMMONIA in the last 168 hours. Coagulation Profile: No results for input(s): INR, PROTIME in the last 168 hours. Cardiac Enzymes: No results for input(s): CKTOTAL, CKMB, CKMBINDEX, TROPONINI in the last 168 hours. BNP (last 3  results) No results for input(s): PROBNP in the last 8760 hours. HbA1C: No results for input(s): HGBA1C in the last 72 hours. CBG: No results for input(s): GLUCAP in the last 168 hours. Lipid Profile: No results for input(s): CHOL, HDL, LDLCALC, TRIG, CHOLHDL, LDLDIRECT in the last 72 hours. Thyroid Function Tests: No results for input(s): TSH, T4TOTAL, FREET4, T3FREE, THYROIDAB in the last 72 hours. Anemia Panel: No results for input(s): VITAMINB12, FOLATE, FERRITIN, TIBC, IRON, RETICCTPCT in the last 72 hours. Sepsis Labs: No results for input(s): PROCALCITON, LATICACIDVEN in the last 168 hours.  No results found for this or any previous visit (from the past 240 hour(s)).    Radiology Studies: No results found.   Scheduled Meds:  apixaban  5 mg Oral BID   arformoterol  15 mcg  Nebulization BID   atorvastatin  40 mg Oral Daily   budesonide (PULMICORT) nebulizer solution  0.5 mg Nebulization BID   Chlorhexidine Gluconate Cloth  6 each Topical Daily   feeding supplement  237 mL Oral TID BM   FLUoxetine  40 mg Oral Daily   guaiFENesin  1,200 mg Oral BID   morphine  15 mg Oral Q8H   OLANZapine  10 mg Oral QHS   predniSONE  30 mg Oral Q breakfast   revefenacin  175 mcg Nebulization Daily   sodium chloride flush  10-40 mL Intracatheter Q12H   sulfamethoxazole-trimethoprim  1 tablet Oral Once per day on Mon Wed Fri   Continuous Infusions:  sodium chloride 10 mL/hr (05/13/21 1620)     LOS: 38 days     Phillips Climes, MD [facility] If 7PM-7AM, please contact night-coverage 06/08/2021, 2:59 PM

## 2021-06-09 DIAGNOSIS — J9601 Acute respiratory failure with hypoxia: Secondary | ICD-10-CM | POA: Diagnosis not present

## 2021-06-09 NOTE — TOC Progression Note (Signed)
Transition of Care [facility]) - Progression Note    Patient Details  Name: Jimmy West MRN: [id number] Date of Birth: October 14, 1950  Transition of Care [facility]) CM/SW [place], LCSW Phone Number: 06/09/2021, 9:53 AM  Clinical Narrative:  Spoke with [place] at [facility]. Patient is approved for hospice but they have to do a face-to-face assessment to determine if he meets criteria for hospice facility vs home with hospice. This will occur tomorrow.    Expected Discharge Plan: Skilled Nursing Facility Barriers to Discharge: No Barriers Identified  Expected Discharge Plan and Services Expected Discharge Plan: [place] Choice: [place] arrangements for the past 2 months: Single Family Home                 DME Arranged: 3-N-1 DME Agency: [employer] Date DME Agency Contacted: 06/06/21     HH Arranged: PT, OT, RN, Nurse's Aide, Social Work [employer] Agency: [place] Date [place]: 06/07/21 Time [place]: [phone] Representative spoke with at [place]: [place]   Social Determinants of Health ([place]) Interventions    Readmission Risk Interventions Readmission Risk Prevention Plan 04/26/2021  Transportation Screening Complete  PCP or Specialist Appt within 3-5 Days Complete  HRI or [place] Complete  Social Work Consult for [place] Planning/Counseling Complete  Palliative Care Screening Not Applicable  Medication Review Press photographer) Complete  Some recent data might be hidden

## 2021-06-09 NOTE — Progress Notes (Signed)
PROGRESS NOTE    Jimmy West  ZOX:[id number] DOB: 1950-12-31 DOA: 05/07/2021 PCP: Pcp, No  253A/253A-AA   Assessment & Plan:   Active Problems:   Primary cancer of right lower lobe of lung (HCC)   Acute hypoxemic respiratory failure (HCC)   SOB (shortness of breath)   Severe protein-calorie malnutrition (HCC)   Non-ST elevation (NSTEMI) myocardial infarction [facility])   70 year old M with PMH of stage IV RLL lung cancer, cancer pain, CAD/stent in 2005, chronic respiratory failure with hypoxia on 2 L, anemia of chronic disease, anxiety, depression and debility presenting with generalized weakness, shortness of breath and dry cough, and admitted for acute on chronic hypoxic respiratory failure in the setting of LLL pneumonia and non-STEMI.  Started on broad-spectrum antibiotics, steroid and IV heparin.  Cardiology consulted recommended medical management and [place] outpatient.  CTA chest negative for PE.   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 -Discussed with pulmonary Dr. Patsey Berthold, plan to continue with prolonged prednisone taper, consideration for infliximab as an outpatient -Patient respiratory status is very tenuous, social worker is assisting with disposition plan, as well palliative medicine, and with poor social support system as no family here, will evaluating most appropriate disposition plan, either home with hospice versus facility with hospice.   -GI input greatly  appreciated, patient is hospice appropriate, as he is with stage IV lung cancer, and not a candidate for either immunotherapy or chemotherapy.    Stage IV lung cancer s/p radiation and chemotherapy previously.   -- Patient is following with Dr. Rogue Bussing  -He was seen this morning by oncology, he is not a good candidate for any further immunotherapy, he has very poor tolerance to chemotherapy, it is reasonable to consider home with hospice on discharge. -Palliative medicine assisting with pain control, adding MS Contin to his current as needed oxycodone regimen.  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  Now back to baseline ~1. --Ensure oral hydration   Anemia, chronic, secondary to cancer treatment - stable --monitor Hgb   Elevated d-dimer  History PE on Eliquis - CTA Chest 10/18 negative for PE - High risk for PE recurrence due to underlying malignancy --cont Eliquis   Sepsis, ruled out --Pulm ruled out pneumonia, so no source of infection to qualify for sepsis.   NSTEMI history of CAD/stent in 2005: Troponin 3153 -> 3506 -> 2927 -> 2894.  EKG with T wave changes in lateral leads but no dynamic changes.  TTE with LVEF of 55 to 60%, G1-DD and no RWMA chest pain-free.  -Completed 48 hours of IV heparin and transition back to home Eliquis. -Medical management due to poor candidacy for heart cath in the setting of respiratory failure -Cardiology recommends outpatient follow-up when stable from respiratory standpoint.   Chronic diastolic CHF:  TTE as above.  Appears euvolemic on exam.  Does  not seem to be on diuretics at home.    Hypotension: Resolved.    Anemia of chronic disease:  H&H stable around 9's, after 1 unit --monitor Hgb   Chronic pain syndrome: --cont home oxycodone  PRN   Depression and Anxiety: Stable -cont Fluoxetine and olanzapine --cont home Xanax, TID PRN, but ordered as q4h PRN so pt can have it at the time intervals that he needs.   Physical deconditioning -uses rolling walker at baseline. -Therapy recommends SNF-cannot go until oxygen requirement does not go above 6L even with exertion. --pt encouraged to participate with PT/OT session when offered --go to the bathroom      Leukocytosis/bandemia -likely from steroid.   Goal of care counseling -patient with comorbidity as above.  Very frail and deconditioned.  Poor long-term prognosis.  Appropriately DNR/DNI.  A candidate for hospice -Palliative medicine consulted.   DVT prophylaxis: GL:OVFIEPP Code Status: DNR  Family Communication: None at bedside, tried to reach his friend [name], unable to leave a Advertising account executive. Level of care: Med-Surg Dispo:   The patient is from: home Anticipated d/c is to: Likely home with home hospice Anticipated d/c date is: undetermined Patient respiratory status is very tenuous, following disposition plan is not clear yet, but likely hospice facility versus home with hospice.   Subjective and Interval History:   Significant events overnight, remains with significant dyspnea with activity, and much improved on current regimen .    Objective: Vitals:   06/09/21 0045 06/09/21 0547 06/09/21 0715 06/09/21 0738  BP: 108/73 109/74  104/65  Pulse: 84 99  99  Resp: 17 16  20   Temp: 97.6 F (36.4 C) 97.9 F (36.6 C)  98.7 F (37.1 C)  TempSrc: Oral     SpO2: 99% 98% 95% 100%  Weight:      Height:        Intake/Output Summary (Last 24 hours) at 06/09/2021 1129 Last data filed at 06/09/2021 1000 Gross per 24 hour  Intake 480 ml  Output 1800 ml  Net -1320 ml   Filed Weights   05/10/21 0901 05/10/21 1537 05/29/21 0343  Weight: 63.2 kg 64 kg 60.1 kg    Examination:   Awake Alert, Oriented X 3, No new F.N deficits, Normal affect, extremely frail  and tenuous respiratory Symmetrical Chest wall movement, Good air movement bilaterally, CTAB RRR,No Gallops,Rubs or new Murmurs, No Parasternal Heave +ve B.Sounds, Abd Soft, No tenderness, No rebound - guarding or rigidity. No Cyanosis, Clubbing or edema, No new Rash or bruise          Data Reviewed: I have personally reviewed following labs and imaging studies  CBC: Recent Labs  Lab 06/03/21 0600 06/05/21 0530  WBC 9.2 9.2  HGB 10.5* 9.9*  HCT 33.8* 31.5*  MCV 98.8 98.7  PLT 203 295   Basic Metabolic Panel: Recent Labs  Lab 06/03/21 0600 06/05/21 0530  NA 141 138  K 4.6 4.5  CL 103 98  CO2 33* 34*  GLUCOSE 84 114*  BUN 28* 26*  CREATININE 0.96 0.87  CALCIUM 9.2 9.0  MG 2.0 1.9   GFR: Estimated Creatinine Clearance: 66.2 mL/min (by C-G formula based on SCr of 0.87 mg/dL). Liver Function Tests: No results for input(s): AST, ALT, ALKPHOS, BILITOT, PROT, ALBUMIN in the last 168 hours.  No results for input(s): LIPASE, AMYLASE in the last 168 hours. No results for input(s): AMMONIA in the last 168 hours. Coagulation Profile: No results for input(s): INR, PROTIME in the last 168  hours. Cardiac Enzymes: No results for input(s): CKTOTAL, CKMB, CKMBINDEX, TROPONINI in the last 168 hours. BNP (last 3 results) No results for input(s): PROBNP in the last 8760 hours. HbA1C: No results for input(s): HGBA1C in the last 72 hours. CBG: No results for input(s): GLUCAP in the last 168 hours. Lipid Profile: No results for input(s): CHOL, HDL, LDLCALC, TRIG, CHOLHDL, LDLDIRECT in the last 72 hours. Thyroid Function Tests: No results for input(s): TSH, T4TOTAL, FREET4, T3FREE, THYROIDAB in the last 72 hours. Anemia Panel: No results for input(s): VITAMINB12, FOLATE, FERRITIN, TIBC, IRON, RETICCTPCT in the last 72 hours. Sepsis Labs: No results for input(s): PROCALCITON, LATICACIDVEN in the last 168 hours.  No results found for this or any previous visit (from the past 240  hour(s)).    Radiology Studies: No results found.   Scheduled Meds:  apixaban  5 mg Oral BID   arformoterol  15 mcg Nebulization BID   atorvastatin  40 mg Oral Daily   budesonide (PULMICORT) nebulizer solution  0.5 mg Nebulization BID   Chlorhexidine Gluconate Cloth  6 each Topical Daily   feeding supplement  237 mL Oral TID BM   FLUoxetine  40 mg Oral Daily   guaiFENesin  1,200 mg Oral BID   morphine  15 mg Oral Q8H   OLANZapine  10 mg Oral QHS   predniSONE  30 mg Oral Q breakfast   revefenacin  175 mcg Nebulization Daily   sodium chloride flush  10-40 mL Intracatheter Q12H   sulfamethoxazole-trimethoprim  1 tablet Oral Once per day on Mon Wed Fri   Continuous Infusions:  sodium chloride 10 mL/hr (05/13/21 1620)     LOS: 6 days     Phillips Climes, MD [facility] If 7PM-7AM, please contact night-coverage 06/09/2021, 11:29 AM

## 2021-06-10 DIAGNOSIS — J849 Interstitial pulmonary disease, unspecified: Secondary | ICD-10-CM | POA: Diagnosis not present

## 2021-06-10 DIAGNOSIS — J9601 Acute respiratory failure with hypoxia: Secondary | ICD-10-CM | POA: Diagnosis not present

## 2021-06-10 MED ORDER — IPRATROPIUM-ALBUTEROL 0.5-2.5 (3) MG/3ML IN SOLN
3.0000 mL | Freq: Four times a day (QID) | RESPIRATORY_TRACT | Status: DC
  Administered 2021-06-10 (×2): 3 mL via RESPIRATORY_TRACT
  Filled 2021-06-10: qty 3

## 2021-06-10 MED ORDER — METHYLPREDNISOLONE SODIUM SUCC 125 MG IJ SOLR
80.0000 mg | Freq: Once | INTRAMUSCULAR | Status: AC
  Administered 2021-06-10: 80 mg via INTRAVENOUS
  Filled 2021-06-10: qty 2

## 2021-06-10 MED ORDER — PREDNISONE 10 MG PO TABS
30.0000 mg | ORAL_TABLET | Freq: Every day | ORAL | Status: DC
  Administered 2021-06-10 – 2021-06-13 (×4): 30 mg via ORAL
  Filled 2021-06-10 (×3): qty 3

## 2021-06-10 NOTE — Progress Notes (Signed)
Occupational Therapy Treatment Patient Details Name: Jimmy West MRN: [id number] DOB: [birth year] Today's Date: 06/10/2021   History of present illness 70 y.o. male was brought to hosp to admit 10/18 for hypoxia, weakness, SOB and noted sepsis from PNA, as well as NSTEMI.  Troponins are high from demand ischemia, EF 40-45%, and has recent asp PNA per chart.  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 Plan was to perform seated bathing/dressing tasks. Upon arrival, pt on 4L heated O2 and SpO2 75%, HR 122. RN notified and placed pt back on non-rebreather mask O2 on 8L. Bumped up to 9L O2 via NRB mask for bed level bathing (per RN cleared for bed level due to O2 needs). Pt required MIN A for UB and LB bathing (back and LEs distal of knees) and use of frequent rest breaks to support O2/HR recovery. Pt desats to mid-80's and HR up to 120's with exertion from bed level, requiring 2-42min to recover to >90% SpO2 and HR <120. Pt continues to benefit from skilled OT services to maximize safety/indep and minimize further functional decline and increased caregiver burden.    Recommendations for follow up therapy are one component of a multi-disciplinary discharge planning process, led by the attending physician.  Recommendations may be updated based on patient status, additional functional criteria and insurance authorization.    Follow Up Recommendations  Other (comment) (Consider SNF vs transition home w/ HHOT pending additional progress and demonstrated tolerance on current O2 range)    Assistance Recommended at Discharge Intermittent Supervision/Assistance  Equipment Recommendations  BSC/3in1    Recommendations for Other Services      Precautions / Restrictions Precautions Precautions: Fall Precaution  Comments: monitor O2 sats and HR; mod fall risk Restrictions Weight Bearing Restrictions: No       Mobility Bed Mobility Overal bed mobility: Needs Assistance Bed Mobility: Rolling Rolling: Supervision              Transfers                   General transfer comment: deferred 2/2 O2 needs this date     Balance                                           ADL either performed or assessed with clinical judgement   ADL Overall ADL's : Needs assistance/impaired     Grooming: Bed level;Set up;Cueing for safety Grooming Details (indicate cue type and reason): cues to minimize time without O2 mask while washing his face Upper Body Bathing: Bed level;Minimal assistance Upper Body Bathing Details (indicate cue type and reason): Min A for washing his back, requiring frequent rest breaks to support O2/HR recovery Lower Body Bathing: Bed level;Minimal assistance;Cueing for safety Lower Body Bathing Details (indicate cue type and reason): Min A for washing legs below the knees, requiring frequent rest breaks to support O2/HR recovery Upper Body Dressing : Bed level;Minimal assistance Upper Body Dressing Details (indicate cue type and reason): cues for strategies to minimize time with O2 mask displaced Lower Body Dressing: Bed level;Minimal assistance Lower Body Dressing Details (indicate cue type and reason): Min A to thread over feet in bed  level, with rest breaks, pt able to perform partial bridge and rolling side to side to complete donning over hips                    Extremity/Trunk Assessment              Vision       Perception     Praxis      Cognition Arousal/Alertness: Awake/alert Behavior During Therapy: WFL for tasks assessed/performed Overall Cognitive Status: Within Functional Limits for tasks assessed                                            Exercises     Shoulder Instructions       General  Comments      Pertinent Vitals/ Pain          Home Living                                          Prior Functioning/Environment              Frequency  Min 2X/week        Progress Toward Goals  OT Goals(current goals can now be found in the care plan section)  Progress towards OT goals: OT to reassess next treatment  Acute Rehab OT Goals Patient Stated Goal: get better OT Goal Formulation: With patient Time For Goal Achievement: 06/18/21 Potential to Achieve Goals: Good  Plan Frequency remains appropriate    Co-evaluation                 AM-PAC OT "6 Clicks" Daily Activity     Outcome Measure   Help from another person eating meals?: None Help from another person taking care of personal grooming?: A Little Help from another person toileting, which includes using toliet, bedpan, or urinal?: A Little Help from another person bathing (including washing, rinsing, drying)?: A Little Help from another person to put on and taking off regular upper body clothing?: A Little Help from another person to put on and taking off regular lower body clothing?: A Little 6 Click Score: 19    End of Session Equipment Utilized During Treatment: Oxygen  OT Visit Diagnosis: Other abnormalities of gait and mobility (R26.89);Muscle weakness (generalized) (M62.81);Unsteadiness on feet (R26.81)   Activity Tolerance Treatment limited secondary to medical complications (Comment) (increased O2 needs limited ADL to bed level)   Patient Left in bed;with call bell/phone within reach   Nurse Communication Other (comment) (O2 sats at start of session)        Time: 1014-1100 OT Time Calculation (min): 46 min  Charges: OT General Charges $OT Visit: 1 Visit OT Treatments $Self Care/Home Management : 38-52 mins  Ardeth Perfect., MPH, MS, OTR/L ascom [phone] 06/10/21, 12:59 PM

## 2021-06-10 NOTE — Progress Notes (Signed)
PT Cancellation Note  Patient Details Name: Jimmy West MRN: [id number] DOB: Apr 23, 1951   Cancelled Treatment:    Reason Eval/Treat Not Completed: Other (comment). Pt kindly requests PT to return in a couple hours. PT assisted pt in fixing remote control to tv prior to leaving room. Will attempt treatment later this afternoon if time allows or tomorrow.    Patrina Levering PT, DPT 06/10/21 3:18 PM [phone]

## 2021-06-10 NOTE — Progress Notes (Signed)
Patient was nasotracheal suctioned. Other than being uncomfortable for the patient, no issues with suctioning. A small amount of clear secretions came out when suctioned. The patient gave a strong cough and cleared some of the congestion, as well.

## 2021-06-10 NOTE — Telephone Encounter (Signed)
Patient still admitted.

## 2021-06-10 NOTE — Progress Notes (Signed)
PROGRESS NOTE    Jimmy West  JSE:[id number] DOB: May 23, 1951 DOA: 05/07/2021 PCP: Pcp, No  253A/253A-AA   Assessment & Plan:   Active Problems:   Primary cancer of right lower lobe of lung (HCC)   Acute hypoxemic respiratory failure (HCC)   SOB (shortness of breath)   Severe protein-calorie malnutrition (HCC)   Non-ST elevation (NSTEMI) myocardial infarction [facility])   70 year old M with PMH of stage IV RLL lung cancer, cancer pain, CAD/stent in 2005, chronic respiratory failure with hypoxia on 2 L, anemia of chronic disease, anxiety, depression and debility presenting with generalized weakness, shortness of breath and dry cough, and admitted for acute on chronic hypoxic respiratory failure in the setting of LLL pneumonia and non-STEMI.  Started on broad-spectrum antibiotics, steroid and IV heparin.  Cardiology consulted recommended medical management and [place] outpatient.  CTA chest negative for PE. 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 -Followed by pulmonary Dr. Patsey Berthold, plan to continue with prolonged prednisone taper, consideration for infliximab as an  outpatient -Patient respiratory status is very tenuous, social worker is assisting with disposition plan, as well palliative medicine, and with poor social support system as no family here, his only support is couple of work friend, his friend [name], she will try to take him home once he is able to provide more support at home . -oncology  input greatly appreciated, patient is hospice appropriate, as he is with stage IV lung cancer, and not a candidate for either immunotherapy or chemotherapy.    Stage IV lung cancer s/p radiation and chemotherapy previously.   -- Patient is following with Dr. Rogue Bussing  -He was seen this morning by oncology, he is not a good candidate for any further immunotherapy, he has very poor tolerance to chemotherapy, it is reasonable to consider home with hospice on discharge. -Palliative medicine assisting with pain control, remains with pain on his as needed oxycodone, so MS Contin has been added.   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  Now back to baseline ~1. --Ensure oral hydration   Anemia, chronic, secondary to cancer treatment - stable --monitor Hgb   Elevated d-dimer  History PE on Eliquis - CTA Chest 10/18 negative for PE - High risk for PE recurrence due to underlying malignancy --cont Eliquis   Sepsis, ruled out --Pulm ruled out pneumonia, so no source of infection to qualify for sepsis.   NSTEMI history of CAD/stent in 2005: Troponin 3153 -> 3506 -> 2927 -> 2894.  EKG with T wave changes in lateral leads but no dynamic changes.  TTE with  LVEF of 55 to 60%, G1-DD and no RWMA chest pain-free.  -Completed 48 hours of IV heparin and transition back to home Eliquis. -Medical management due to poor candidacy for heart cath in the setting of  respiratory failure -Cardiology recommends outpatient follow-up when stable from respiratory standpoint.   Chronic diastolic CHF:  TTE as above.  Appears euvolemic on exam.  Does not seem to be on diuretics at home.    Hypotension: Resolved.    Anemia of chronic disease:  H&H stable around 9's, after 1 unit --monitor Hgb   Chronic pain syndrome: --cont home oxycodone PRN   Depression and Anxiety: Stable -cont Fluoxetine and olanzapine --cont home Xanax, TID PRN, but ordered as q4h PRN so pt can have it at the time intervals that he needs.   Physical deconditioning -uses rolling walker at baseline. -Therapy recommends SNF-cannot go until oxygen requirement does not go above 6L even with exertion. --pt encouraged to participate with PT/OT session when offered --go to the bathroom      Leukocytosis/bandemia -likely from steroid.   Goal of care counseling -patient with comorbidity as above.  Very frail and deconditioned.  Poor long-term prognosis.  Appropriately DNR/DNI.  A candidate for hospice -Palliative medicine consulted.   DVT prophylaxis: YH:CWCBJSE Code Status: DNR  Family Communication: None at bedside, tried to reach his friend [name], unable to leave a Advertising account executive. Level of care: Med-Surg Dispo:   The patient is from: home Anticipated d/c is to: Likely home with home hospice Anticipated d/c date is: undetermined Patient respiratory status is very tenuous, following disposition plan is not clear yet, but likely hospice facility versus home with hospice.   Subjective and Interval History:   Patient reports dyspnea, cough and congestion, he had an episode yesterday when he choked on water .  Objective: Vitals:   06/10/21 0814 06/10/21 1130 06/10/21 1303 06/10/21 1336  BP:  106/72    Pulse: (!) 107 (!) 110 (!) 103   Resp:  17    Temp:  98.2 F (36.8 C)    TempSrc:      SpO2: 96% 95% 93% 92%  Weight:      Height:        Intake/Output Summary (Last 24  hours) at 06/10/2021 1529 Last data filed at 06/10/2021 1016 Gross per 24 hour  Intake 960 ml  Output 800 ml  Net 160 ml   Filed Weights   05/10/21 0901 05/10/21 1537 05/29/21 0343  Weight: 63.2 kg 64 kg 60.1 kg    Examination:   Sleepy, but wakes up and answer questions appropriately, alert, Oriented X 3, extremely frail, deconditioned, with tenuous respiratory status, appears to be dyspneic with minimal activity  symmetrical Chest wall movement, airway wheezing and congestion, but otherwise lungs clear to auscultation RRR,No Gallops,Rubs or new Murmurs, No Parasternal Heave +ve B.Sounds, Abd Soft, No tenderness, No rebound - guarding or rigidity. No Cyanosis, Clubbing or edema, No new Rash or bruise          Data Reviewed: I have personally reviewed following labs and imaging studies  CBC: Recent Labs  Lab 06/05/21 0530  WBC 9.2  HGB 9.9*  HCT 31.5*  MCV 98.7  PLT 831   Basic Metabolic Panel: Recent Labs  Lab 06/05/21 0530  NA 138  K 4.5  CL 98  CO2 34*  GLUCOSE 114*  BUN 26*  CREATININE 0.87  CALCIUM 9.0  MG 1.9   GFR: Estimated Creatinine Clearance: 66.2 mL/min (by C-G formula  based on SCr of 0.87 mg/dL). Liver Function Tests: No results for input(s): AST, ALT, ALKPHOS, BILITOT, PROT, ALBUMIN in the last 168 hours.  No results for input(s): LIPASE, AMYLASE in the last 168 hours. No results for input(s): AMMONIA in the last 168 hours. Coagulation Profile: No results for input(s): INR, PROTIME in the last 168 hours. Cardiac Enzymes: No results for input(s): CKTOTAL, CKMB, CKMBINDEX, TROPONINI in the last 168 hours. BNP (last 3 results) No results for input(s): PROBNP in the last 8760 hours. HbA1C: No results for input(s): HGBA1C in the last 72 hours. CBG: No results for input(s): GLUCAP in the last 168 hours. Lipid Profile: No results for input(s): CHOL, HDL, LDLCALC, TRIG, CHOLHDL, LDLDIRECT in the last 72 hours. Thyroid Function Tests: No  results for input(s): TSH, T4TOTAL, FREET4, T3FREE, THYROIDAB in the last 72 hours. Anemia Panel: No results for input(s): VITAMINB12, FOLATE, FERRITIN, TIBC, IRON, RETICCTPCT in the last 72 hours. Sepsis Labs: No results for input(s): PROCALCITON, LATICACIDVEN in the last 168 hours.  No results found for this or any previous visit (from the past 240 hour(s)).    Radiology Studies: No results found.   Scheduled Meds:  apixaban  5 mg Oral BID   arformoterol  15 mcg Nebulization BID   atorvastatin  40 mg Oral Daily   budesonide (PULMICORT) nebulizer solution  0.5 mg Nebulization BID   Chlorhexidine Gluconate Cloth  6 each Topical Daily   feeding supplement  237 mL Oral TID BM   FLUoxetine  40 mg Oral Daily   guaiFENesin  1,200 mg Oral BID   ipratropium-albuterol  3 mL Nebulization QID   morphine  15 mg Oral Q8H   OLANZapine  10 mg Oral QHS   predniSONE  30 mg Oral Q breakfast   revefenacin  175 mcg Nebulization Daily   sodium chloride flush  10-40 mL Intracatheter Q12H   sulfamethoxazole-trimethoprim  1 tablet Oral Once per day on Mon Wed Fri   Continuous Infusions:  sodium chloride 10 mL/hr (05/13/21 1620)     LOS: 46 days     Phillips Climes, MD [facility] If 7PM-7AM, please contact night-coverage 06/10/2021, 3:29 PM

## 2021-06-10 NOTE — TOC Progression Note (Addendum)
Transition of Care [facility]) - Progression Note    Patient Details  Name: Jimmy West MRN: [id number] Date of Birth: Mar 21, 1951  Transition of Care [facility] [address]) CM/SW [place], [place] Phone Number: 06/10/2021, 1:21 PM  Clinical Narrative:     Update: CSW notes per MD patient family/friend [name] not comfortable yet with taking patient home with hospice as he looks worse today. MD agreeable to re assess Thursday, patient may qualify for residential hospice by this time, however does not qualify today per [facility]. [facility] will continue to follow.      Per [facility] they report patient does not meed criteria for residential hospice, however can go home with hospice services. They are aware patient will be going to his friend Kristine's sister's home located at:   [address] in [place], [place]  Per [facility] all DME is at the home that patient needs and he can discharge home with hospice whenever ready.   TOC will continue to follow for when patient medically ready to dc home with [facility] hospice.    Expected Discharge Plan: Skilled Nursing Facility Barriers to Discharge: No Barriers Identified  Expected Discharge Plan and Services Expected Discharge Plan: [place] Choice: [place] arrangements for the past 2 months: Single Family Home                 DME Arranged: 3-N-1 DME Agency: [employer] Date DME Agency Contacted: 06/06/21     HH Arranged: PT, OT, RN, Nurse's Aide, Social Work [employer] Agency: [place] Date [place]: 06/07/21 Time [place]: [phone] Representative spoke with at [place]: [place]   Social Determinants of Health ([place]) Interventions    Readmission Risk Interventions Readmission Risk Prevention Plan 04/26/2021  Transportation Screening Complete  PCP or Specialist Appt within 3-5 Days Complete  HRI or [place] Complete  Social  Work Consult for [place] Planning/Counseling Complete  Palliative Care Screening Not Applicable  Medication Review Press photographer) Complete  Some recent data might be hidden

## 2021-06-10 NOTE — Progress Notes (Signed)
[facility]) Hospital Liaison Note  Patient chart and information reviewed by [facility] physician. Patient is not eligible for the Hospice Home. Patient remains eligible for home with hospice.   Plan is for patient to discharge to friend Kristine's sister's home at [address], [place].  Verified no additional DME needs at this time.    Please send signed and completed DNR home with patient/family. Please provide prescriptions at discharge as needed to ensure ongoing symptom management.    ACC information and contact numbers given to family. Above information shared with [place].    Please do not hesitate to call with any hospice related questions or concerns.    Thank you for the opportunity to participate in this patient's care.    Bobbie "Loren Racer, RN, BSN [facility] Liaison [phone]

## 2021-06-11 DIAGNOSIS — J9601 Acute respiratory failure with hypoxia: Secondary | ICD-10-CM | POA: Diagnosis not present

## 2021-06-11 LAB — GLUCOSE, CAPILLARY
Glucose-Capillary: 135 mg/dL — ABNORMAL HIGH (ref 70–99)
Glucose-Capillary: 195 mg/dL — ABNORMAL HIGH (ref 70–99)
Glucose-Capillary: 205 mg/dL — ABNORMAL HIGH (ref 70–99)
Glucose-Capillary: 142 mg/dL — ABNORMAL HIGH (ref 70–99)

## 2021-06-11 NOTE — Progress Notes (Signed)
   06/10/21 1557  Assess: MEWS Score  Temp 98.5 F (36.9 C)  BP 124/82  Pulse Rate (!) 118  Resp 20  SpO2 (!) 86 %  O2 Device Nasal Cannula  O2 Flow Rate (L/min) 4 L/min  Assess: MEWS Score  MEWS Temp 0  MEWS Systolic 0  MEWS Pulse 2  MEWS RR 0  MEWS LOC 0  MEWS Score 2  MEWS Score Color Yellow  Assess: if the MEWS score is Yellow or Red  Were vital signs taken at a resting state? Yes  Focused Assessment Change from prior assessment (see assessment flowsheet)  Does the patient meet 2 or more of the SIRS criteria? No  Does the patient have a confirmed or suspected source of infection? No  MEWS guidelines implemented *See Row Information* Yes  Treat  MEWS Interventions Administered scheduled meds/treatments  Take Vital Signs  Increase Vital Sign Frequency  Yellow: Q 2hr X 2 then Q 4hr X 2, if remains yellow, continue Q 4hrs  Escalate  MEWS: Escalate Yellow: discuss with charge nurse/RN and consider discussing with provider and RRT  Notify: Charge Nurse/RN  Name of Charge Nurse/RN Notified Ashly, RN  Date Charge Nurse/RN Notified 06/10/21  Time Charge Nurse/RN Notified 1615  Document  Patient Outcome Other (Comment) (continue to monitor)  Assess: SIRS CRITERIA  SIRS Temperature  0  SIRS Pulse 1  SIRS Respirations  0  SIRS WBC 0  SIRS Score Sum  1

## 2021-06-11 NOTE — Progress Notes (Signed)
Physical Therapy Treatment Patient Details Name: Jimmy West MRN: [id number] DOB: 09-11-1950 Today's Date: 06/11/2021   History of Present Illness 70 y.o. male was brought to hosp to admit 10/18 for hypoxia, weakness, SOB and noted sepsis from PNA, as well as NSTEMI.  Troponins are high from demand ischemia, EF 40-45%, and has recent asp PNA per chart.  PMHx:  R lower lobe stage IV adenocarcinoma of the lung, chronic anemia, anxiety, depression, presence of Port-A-Cath, cad s/p PCI with stent placement in 2005, tobacco abuse quit in 2005    PT Comments    Pt received supine in bed, agreeable to therapy. He demonstrated remarkable progress by ambulating 131ft in today's session. CGA was provided at all times, RW used. Continuous monitoring of SpO2 and HR throughout session. 5L O2 at rest, SpO2 90%. Initially titrated to 10L for ambulation however SpO2 decreased to 85% within the first 61ft. Titrated again to 15L O2, pt was able to maintain 87% or greater through remainder of ambulation. HR remained elevated between 100-120 during ambulation. Pt required prolonged recovery time. Oxygen weaned down at end of session - pt on 7L O2 upon leaving. RN notified of current O2 status. Would benefit from skilled PT to address above deficits and promote optimal return to PLOF.   Recommendations for follow up therapy are one component of a multi-disciplinary discharge planning process, led by the attending physician.  Recommendations may be updated based on patient status, additional functional criteria and insurance authorization.  Follow Up Recommendations  Skilled nursing-short term rehab (<3 hours/day)     Assistance Recommended at Discharge Intermittent Supervision/Assistance  Equipment Recommendations  None recommended by PT    Recommendations for Other Services       Precautions / Restrictions Precautions Precautions: Fall Precaution Comments: monitor O2 sats and HR; mod fall  risk Restrictions Weight Bearing Restrictions: No     Mobility  Bed Mobility Overal bed mobility: Needs Assistance Bed Mobility: Supine to Sit     Supine to sit: Supervision     General bed mobility comments: With use of bed rails    Transfers Overall transfer level: Needs assistance Equipment used: Rolling walker (2 wheels) Transfers: Sit to/from Stand Sit to Stand: Supervision           General transfer comment: VC on hand placement and sequencing    Ambulation/Gait Ambulation/Gait assistance: Min guard Gait Distance (Feet): 180 Feet Assistive device: Rolling walker (2 wheels) Gait Pattern/deviations: Step-through pattern Gait velocity: decreased     General Gait Details: Initially on 10L O2 - dipped to 85% within 64ft. Increased to 15L O2 - maintain 87-89%. Prolonged recovery time.   Stairs             Wheelchair Mobility    Modified Rankin (Stroke Patients Only)       Balance Overall balance assessment: Needs assistance Sitting-balance support: Feet supported;No upper extremity supported Sitting balance-Leahy Scale: Good     Standing balance support: During functional activity;Bilateral upper extremity supported Standing balance-Leahy Scale: Poor Standing balance comment: Fair balance during ambulation. With fatigue pt had 2 LOB while turning and backing up to recliner that required MOD A to correct.                            Cognition Arousal/Alertness: Awake/alert Behavior During Therapy: WFL for tasks assessed/performed Overall Cognitive Status: Within Functional Limits for tasks assessed  Exercises      General Comments        Pertinent Vitals/Pain Pain Assessment: Faces Faces Pain Scale: Hurts little more Pain Location: back Pain Descriptors / Indicators: Aching;Pressure Pain Intervention(s): Limited activity within patient's tolerance;Monitored during  session    Home Living                          Prior Function            PT Goals (current goals can now be found in the care plan section) Acute Rehab PT Goals Patient Stated Goal: to go home Potential to Achieve Goals: Fair    Frequency    Min 2X/week      PT Plan      Co-evaluation              AM-PAC PT "6 Clicks" Mobility   Outcome Measure  Help needed turning from your back to your side while in a flat bed without using bedrails?: None Help needed moving from lying on your back to sitting on the side of a flat bed without using bedrails?: None Help needed moving to and from a bed to a chair (including a wheelchair)?: A Little Help needed standing up from a chair using your arms (e.g., wheelchair or bedside chair)?: A Little Help needed to walk in hospital room?: A Little Help needed climbing 3-5 steps with a railing? : A Little 6 Click Score: 20    End of Session Equipment Utilized During Treatment: Oxygen;Gait belt Activity Tolerance: Patient tolerated treatment well;Patient limited by fatigue Patient left: with call bell/phone within reach;in chair;with chair alarm set Nurse Communication: Mobility status PT Visit Diagnosis: Muscle weakness (generalized) (M62.81);Difficulty in walking, not elsewhere classified (R26.2);Unsteadiness on feet (R26.81)     Time: 3267-1245 PT Time Calculation (min) (ACUTE ONLY): 21 min  Charges:  $Therapeutic Exercise: 8-22 mins                     Patrina Levering PT, DPT 06/11/21 12:36 PM [phone]

## 2021-06-11 NOTE — Progress Notes (Signed)
[facility]) Hospital Liaison Note  Plan remains for patient to discharge to friend Kristine's sister's home with hospice services on 11.24.22.   Hospital liaison will continue to follow through discharge disposition.  Please do not hesitate to call with any hospice related questions.   Thank you,  Bobbie "Loren Racer, [place], BSN [facility] Liaison [phone]

## 2021-06-11 NOTE — Progress Notes (Signed)
PROGRESS NOTE    Jimmy West  HGD:[id number] DOB: 10/05/1950 DOA: 05/07/2021 PCP: Pcp, No  253A/[id number]-AA   Assessment & Plan:   Active Problems:   Primary cancer of right lower lobe of lung (HCC)   Acute hypoxemic respiratory failure (HCC)   SOB (shortness of breath)   Severe protein-calorie malnutrition (HCC)   Non-ST elevation (NSTEMI) myocardial infarction [facility])   70 year old M with PMH of stage IV RLL lung cancer, cancer pain, CAD/stent in 2005, chronic respiratory failure with hypoxia on 2 L, anemia of chronic disease, anxiety, depression and debility presenting with generalized weakness, shortness of breath and dry cough, and admitted for acute on chronic hypoxic respiratory failure in the setting of LLL pneumonia and non-STEMI.  Started on broad-spectrum antibiotics, steroid and IV heparin.  Cardiology consulted recommended medical management and [place] outpatient.  CTA chest negative for PE. 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 -Followed by pulmonary Dr. Patsey Berthold, plan to continue with prolonged prednisone taper, consideration for infliximab as an  outpatient -Patient respiratory status is very tenuous, social worker is assisting with disposition plan, as well palliative medicine, and with poor social support system as no family here, his only support is couple of work friend, his friend [name], she will try to take him home once he is able to provide more support at home . -oncology  input greatly appreciated, patient is hospice appropriate, as he is with stage IV lung cancer, and not a candidate for either immunotherapy or chemotherapy. -Patient to be discharged on 30 mg of oral prednisone daily.    Stage IV lung cancer s/p radiation and chemotherapy previously.   -- Patient is following with Dr. Rogue Bussing  -He was seen this morning by oncology, he is not a good candidate for any further immunotherapy, he has very poor tolerance to chemotherapy, it is reasonable to consider home with hospice on discharge. -Palliative medicine assisting with pain control, remains with pain on his as needed oxycodone, so MS Contin has been added.   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  Now back to baseline ~1. --Ensure oral hydration   Anemia, chronic, secondary to cancer treatment - stable --monitor Hgb   Elevated d-dimer  History PE on Eliquis - CTA Chest 10/18 negative for PE - High risk for PE recurrence due to underlying malignancy --cont Eliquis   Sepsis, ruled out --Pulm ruled out pneumonia, so no source of infection to qualify for sepsis.   NSTEMI history of CAD/stent in 2005: Troponin 3153 -> 3506 -> 2927 -> 2894.  EKG with T wave  changes in lateral leads but no dynamic changes.  TTE with LVEF of 55 to 60%, G1-DD and no RWMA chest pain-free.  -Completed 48 hours of IV heparin and transition back to home Eliquis. -Medical management due  to poor candidacy for heart cath in the setting of respiratory failure -Cardiology recommends outpatient follow-up when stable from respiratory standpoint.   Chronic diastolic CHF:  TTE as above.  Appears euvolemic on exam.  Does not seem to be on diuretics at home.    Hypotension: Resolved.    Anemia of chronic disease:  H&H stable around 9's, after 1 unit --monitor Hgb   Chronic pain syndrome: --cont home oxycodone PRN   Depression and Anxiety: Stable -cont Fluoxetine and olanzapine --cont home Xanax, TID PRN, but ordered as q4h PRN so pt can have it at the time intervals that he needs.   Physical deconditioning -uses rolling walker at baseline. -Therapy recommends SNF-cannot go until oxygen requirement does not go above 6L even with exertion. --pt encouraged to participate with PT/OT session when offered --go to the bathroom      Leukocytosis/bandemia -likely from steroid.   Goal of care counseling -patient with comorbidity as above.  Very frail and deconditioned.  Poor long-term prognosis.  Appropriately DNR/DNI.  A candidate for hospice -Palliative medicine consulted.   DVT prophylaxis: WN:[id number] Code Status: DNR  Family Communication: None at bedside today, but I did discuss with his friend [name] multiple times yesterday.  .  Level of care: Med-Surg Dispo:   The patient is from: home Anticipated d/c is to: Likely home with home hospice Anticipated d/c date is: Plan for discharge home with hospice, he has poor social support, his friend [name] will take him at her sister's house, they report they should be ready for him on 11/24.     Subjective and Interval History:   No significant change, patient reports dyspnea at baseline, worsened with activity.     Objective: Vitals:   06/11/21 0759 06/11/21 0802 06/11/21 0834 06/11/21 1147  BP:   107/65 121/67  Pulse:   97 100  Resp:   (!) 22 (!) 22  Temp:   97.6 F (36.4 C) (!) 97.5 F (36.4 C)  TempSrc:    Oral Oral  SpO2: 95% 97% 96% 92%  Weight:      Height:        Intake/Output Summary (Last 24 hours) at 06/11/2021 1421 Last data filed at 06/11/2021 1330 Gross per 24 hour  Intake 1200 ml  Output 975 ml  Net 225 ml   Filed Weights   05/10/21 0901 05/10/21 1537 05/29/21 0343  Weight: 63.2 kg 64 kg 60.1 kg    Examination:   Awake Alert, Oriented X 3, frail, appears to be more comfortable today.  Symmetrical Chest wall movement, Good air movement bilaterally, CTAB RRR,No Gallops,Rubs or new Murmurs, No Parasternal Heave +ve B.Sounds, Abd Soft, No tenderness, No rebound - guarding or rigidity. No Cyanosis, Clubbing or edema, No new Rash or bruise           Data Reviewed: I have personally reviewed following labs and imaging studies  CBC: Recent Labs  Lab 06/05/21 0530  WBC 9.2  HGB 9.9*  HCT 31.5*  MCV 98.7  PLT 009   Basic Metabolic Panel: Recent Labs  Lab 06/05/21 0530  NA 138  K 4.5  CL 98  CO2 34*  GLUCOSE 114*  BUN 26*  CREATININE 0.87  CALCIUM 9.0  MG 1.9   GFR:  Estimated Creatinine Clearance: 66.2 mL/min (by C-G formula based on SCr of 0.87 mg/dL). Liver Function Tests: No results for input(s): AST, ALT, ALKPHOS, BILITOT, PROT, ALBUMIN in the last 168 hours.  No results for input(s): LIPASE, AMYLASE in the last 168 hours. No results for input(s): AMMONIA in the last 168 hours. Coagulation Profile: No results for input(s): INR, PROTIME in the last 168 hours. Cardiac Enzymes: No results for input(s): CKTOTAL, CKMB, CKMBINDEX, TROPONINI in the last 168 hours. BNP (last 3 results) No results for input(s): PROBNP in the last 8760 hours. HbA1C: No results for input(s): HGBA1C in the last 72 hours. CBG: Recent Labs  Lab 06/11/21 0832 06/11/21 1149  GLUCAP 135* 195*   Lipid Profile: No results for input(s): CHOL, HDL, LDLCALC, TRIG, CHOLHDL, LDLDIRECT in the last 72 hours. Thyroid Function Tests: No results for input(s): TSH, T4TOTAL,  FREET4, T3FREE, THYROIDAB in the last 72 hours. Anemia Panel: No results for input(s): VITAMINB12, FOLATE, FERRITIN, TIBC, IRON, RETICCTPCT in the last 72 hours. Sepsis Labs: No results for input(s): PROCALCITON, LATICACIDVEN in the last 168 hours.  No results found for this or any previous visit (from the past 240 hour(s)).    Radiology Studies: No results found.   Scheduled Meds:  apixaban  5 mg Oral BID   arformoterol  15 mcg Nebulization BID   atorvastatin  40 mg Oral Daily   budesonide (PULMICORT) nebulizer solution  0.5 mg Nebulization BID   Chlorhexidine Gluconate Cloth  6 each Topical Daily   feeding supplement  237 mL Oral TID BM   FLUoxetine  40 mg Oral Daily   guaiFENesin  1,200 mg Oral BID   morphine  15 mg Oral Q8H   OLANZapine  10 mg Oral QHS   predniSONE  30 mg Oral Q breakfast   revefenacin  175 mcg Nebulization Daily   sodium chloride flush  10-40 mL Intracatheter Q12H   sulfamethoxazole-trimethoprim  1 tablet Oral Once per day on Mon Wed Fri   Continuous Infusions:  sodium chloride 10 mL/hr (05/13/21 1620)     LOS: 34 days     Phillips Climes, MD [facility] If 7PM-7AM, please contact night-coverage 06/11/2021, 2:21 PM

## 2021-06-12 ENCOUNTER — Encounter: Payer: Self-pay | Admitting: Hospitalist

## 2021-06-12 NOTE — Care Management Important Message (Signed)
Important Message  Patient Details  Name: Phill Steck MRN: [id number] Date of Birth: 11/30/1950   Medicare Important Message Given:  Other (see comment)  Disposition to discharge home with hospice services.  Medicare IM not given at this time.     Dannette Barbara 06/12/2021, 9:43 AM

## 2021-06-12 NOTE — Progress Notes (Signed)
[facility]) Hospital Liaison Note   Plan remains for patient to discharge to friend Kristine's sister's home with hospice services on 11.24.22.    Hospital liaison will continue to follow through discharge disposition.   Please do not hesitate to call with any hospice related questions.    Thank you,   Bobbie "Loren Racer, [place], BSN [facility] Liaison [phone]

## 2021-06-12 NOTE — Progress Notes (Signed)
Progress Note    Jimmy West  ZOX:[id number] DOB: 1951-03-03  DOA: 05/07/2021 PCP: Pcp, No      Brief Narrative:    Medical records reviewed and are as summarized below:  Jimmy West is a 70 y.o. male history significant for stage IV right lower lobe lung cancer, cancer pain, CAD s/p stenting 2005, chronic hypoxic respiratory failure on 2 L/min oxygen, anemia of chronic disease, anxiety, depression, debility, who presented to the hospital with generalized weakness, shortness of breath and dry cough.      Assessment/Plan:   Active Problems:   Primary cancer of right lower lobe of lung (HCC)   Acute hypoxemic respiratory failure (HCC)   SOB (shortness of breath)   Severe protein-calorie malnutrition (HCC)   Non-ST elevation (NSTEMI) myocardial infarction ([place])   Body mass index is 21.39 kg/m.   Acute on chronic hypoxemic respiratory failure: He is on 7L/min oxygen.  He normally uses 2 L/min oxygen at home.  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  Continue prednisone taper. Recent hospitalization for aspiration pneumonia  Stage IV lung cancer: Poor prognosis.  Not a candidate for further chemotherapy or immunotherapy per oncologist.  NSTEMI, history of CAD s/p coronary stent in 2005: Medical management recommended by cardiologist because of poor candidacy for heart cath.  Continue Eliquis.  AKI on CKD stage II: Creatinine stable  Other comorbidities include history of PE on Eliquis, anemia of chronic disease, chronic diastolic CHF, chronic pain syndrome  Diet Order             DIET DYS 3 Room service appropriate? Yes; Fluid consistency: Thin; Fluid restriction: 1500 mL Fluid  Diet effective now                      Consultants: Cardiologist Oncologist Intensivist  Procedures: None    Medications:    apixaban  5 mg Oral BID   arformoterol  15 mcg Nebulization BID   atorvastatin   40 mg Oral Daily   budesonide (PULMICORT) nebulizer solution  0.5 mg Nebulization BID   Chlorhexidine Gluconate Cloth  6 each Topical Daily   feeding supplement  237 mL Oral TID BM   FLUoxetine  40 mg Oral Daily   guaiFENesin  1,200 mg Oral BID   morphine  15 mg Oral Q8H   OLANZapine  10 mg Oral QHS   predniSONE  30 mg Oral Q breakfast   revefenacin  175 mcg Nebulization Daily   sodium chloride flush  10-40 mL Intracatheter Q12H   sulfamethoxazole-trimethoprim  1 tablet Oral Once per day on Mon Wed Fri   Continuous Infusions:  sodium chloride 10 mL/hr (05/13/21 1620)     Anti-infectives (From admission, onward)    Start     Dose/Rate Route Frequency Ordered Stop   05/17/21 0900  sulfamethoxazole-trimethoprim (BACTRIM DS) 800-160 MG per tablet 1 tablet        1 tablet Oral Once per day on Mon Wed Fri 05/16/21 0935     05/13/21 1030  ceFEPIme (MAXIPIME) 2 g in sodium chloride 0.9 % 100 mL IVPB        2 g 200 mL/hr over 30 Minutes Intravenous Every 12 hours 05/13/21 0952 05/13/21 1959   05/11/21 1630  azithromycin (ZITHROMAX) 500 mg in sodium chloride 0.9 % 250 mL IVPB        500 mg 250 mL/hr over 60 Minutes Intravenous Every 24 hours 05/11/21 1537 05/13/21 1721  05/08/21 1800  vancomycin (VANCOREADY) IVPB 750 mg/150 mL  Status:  Discontinued        750 mg 150 mL/hr over 60 Minutes Intravenous Every 24 hours 05/08/21 1135 05/09/21 1448   05/08/21 0800  ceFEPIme (MAXIPIME) 2 g in sodium chloride 0.9 % 100 mL IVPB  Status:  Discontinued        2 g 200 mL/hr over 30 Minutes Intravenous Every 12 hours 05/07/21 1929 05/13/21 0952   05/07/21 2200  ceFEPIme (MAXIPIME) 2 g in sodium chloride 0.9 % 100 mL IVPB  Status:  Discontinued        2 g 200 mL/hr over 30 Minutes Intravenous Every 12 hours 05/07/21 1927 05/07/21 1929   05/07/21 1930  vancomycin (VANCOREADY) IVPB 500 mg/100 mL        500 mg 100 mL/hr over 60 Minutes Intravenous  Once 05/07/21 1927 05/07/21 2144   05/07/21 1927   vancomycin variable dose per unstable renal function (pharmacist dosing)  Status:  Discontinued         Does not apply See admin instructions 05/07/21 1927 05/09/21 1448   05/07/21 1600  vancomycin (VANCOCIN) IVPB 1000 mg/200 mL premix        1,000 mg 200 mL/hr over 60 Minutes Intravenous  Once 05/07/21 1552 05/07/21 1801   05/07/21 1600  ceFEPIme (MAXIPIME) 2 g in sodium chloride 0.9 % 100 mL IVPB        2 g 200 mL/hr over 30 Minutes Intravenous  Once 05/07/21 1552 05/07/21 1700              Family Communication/Anticipated D/C date and plan/Code Status   DVT prophylaxis:  apixaban (ELIQUIS) tablet 5 mg     Code Status: DNR  Family Communication: [name], friend, was on speaker phone during my encounter. Disposition Plan: Plan to discharge home with hospice tomorrow   Status is: Inpatient  Remains inpatient appropriate because: Severe hypoxia           Subjective:   Interval events noted.  He complains of shortness of breath.  Objective:    Vitals:   06/12/21 0406 06/12/21 0720 06/12/21 0746 06/12/21 1150  BP: 114/64  97/66 120/75  Pulse: 95  84 98  Resp: 18  18 18   Temp: 98.4 F (36.9 C)  98.2 F (36.8 C) 97.9 F (36.6 C)  TempSrc: Oral   Oral  SpO2: 94% 97% 100% 95%  Weight:      Height:       No data found.   Intake/Output Summary (Last 24 hours) at 06/12/2021 1507 Last data filed at 06/12/2021 1151 Gross per 24 hour  Intake 1080 ml  Output 3125 ml  Net -2045 ml   Filed Weights   05/10/21 0901 05/10/21 1537 05/29/21 0343  Weight: 63.2 kg 64 kg 60.1 kg    Exam:  GEN: NAD SKIN: Warm and dry EYES: No pallor or icterus ENT: MMM CV: RRR PULM: Bibasilar  rales.  No wheezing heard ABD: soft, ND, NT, +BS CNS: AAO x 3, non focal EXT: No edema or tenderness        Data Reviewed:   I have personally reviewed following labs and imaging studies:  Labs: Labs show the following:   Basic Metabolic Panel: No results for  input(s): NA, K, CL, CO2, GLUCOSE, BUN, CREATININE, CALCIUM, MG, PHOS in the last 168 hours. GFR Estimated Creatinine Clearance: 66.2 mL/min (by C-G formula based on SCr of 0.87 mg/dL). Liver Function Tests: No results for  input(s): AST, ALT, ALKPHOS, BILITOT, PROT, ALBUMIN in the last 168 hours. No results for input(s): LIPASE, AMYLASE in the last 168 hours. No results for input(s): AMMONIA in the last 168 hours. Coagulation profile No results for input(s): INR, PROTIME in the last 168 hours.  CBC: No results for input(s): WBC, NEUTROABS, HGB, HCT, MCV, PLT in the last 168 hours. Cardiac Enzymes: No results for input(s): CKTOTAL, CKMB, CKMBINDEX, TROPONINI in the last 168 hours. BNP (last 3 results) No results for input(s): PROBNP in the last 8760 hours. CBG: Recent Labs  Lab 06/11/21 0832 06/11/21 1149 06/11/21 1512 06/11/21 1648  GLUCAP 135* 195* 205* 142*   D-Dimer: No results for input(s): DDIMER in the last 72 hours. Hgb A1c: No results for input(s): HGBA1C in the last 72 hours. Lipid Profile: No results for input(s): CHOL, HDL, LDLCALC, TRIG, CHOLHDL, LDLDIRECT in the last 72 hours. Thyroid function studies: No results for input(s): TSH, T4TOTAL, T3FREE, THYROIDAB in the last 72 hours.  Invalid input(s): FREET3 Anemia work up: No results for input(s): VITAMINB12, FOLATE, FERRITIN, TIBC, IRON, RETICCTPCT in the last 72 hours. Sepsis Labs: No results for input(s): PROCALCITON, WBC, LATICACIDVEN in the last 168 hours.  Microbiology No results found for this or any previous visit (from the past 240 hour(s)).  Procedures and diagnostic studies:  No results found.             LOS: 36 days   [place] Copywriter, advertising on [email or web address]. If 7PM-7AM, please contact night-coverage at [email or web address]     06/12/2021, 3:07 PM

## 2021-06-12 NOTE — Progress Notes (Signed)
PT Cancellation Note  Patient Details Name: Jimmy West MRN: [id number] DOB: 1950/10/01   Cancelled Treatment:    Reason Eval/Treat Not Completed: Other (comment)  Pt offered therapy services today.  Stated he recently got "bad news" stating "there is nothing else they can do for me."  Pt declined therapy at this time.  Confirmed plan to discharge home tomorrow.  Stated he has all equipment needed for home.    Chesley Noon 06/12/2021, 11:38 AM

## 2021-06-12 NOTE — Progress Notes (Addendum)
Pt got up independently to go to the bathroom. Has been going on his own, or calls when he needed assistance. He reached over for his shoes and the socks he had on were slick and he sat on the floor. He is not injured, and has no skin issues. He is not complaining of pain relating to sitting on the floor, but does have chronic pain. NP notified of this incident. Patient was discovered by NT, after he called to the desk for assistance. The RN and NT assisted patient back to the bed and told patient the bed alarm will be utilized and he needs to continue to call when he needs to get out of bed. Asked patient if he needed to have a BM, because he always uses the urinal and he said no. He said he will go back to using the urinal. He was medicated with bedtime pain medication at HS and was asleep. The NT had gone in earlier to get q 4 VS.   Bridgette Habermann DNP RN 0030 AM

## 2021-06-12 NOTE — TOC Progression Note (Signed)
Transition of Care [facility]) - Progression Note    Patient Details  Name: Jimmy West MRN: [id number] Date of Birth: 1951/02/10  Transition of [facility]) CM/SW [place], LCSW Phone Number: 06/12/2021, 2:55 PM  Clinical Narrative:   Since patient is currently on 7 L oxygen, [facility] is ordering larger concentrator and portable tanks for home use. They are hoping it will be delivered today. Unable to secure non-emergency ambulance transport home so friend [name] will transport him. [facility] is also closed tomorrow. Patient is aware and agreeable. Per RN, patient ambulates well, just gets short of breath.  Expected Discharge Plan: Skilled Nursing Facility Barriers to Discharge: No Barriers Identified  Expected Discharge Plan and Services Expected Discharge Plan: [place]: [place] arrangements for the past 2 months: Single Family Home                 DME Arranged: 3-N-1 DME Agency: [employer] Date DME Agency Contacted: 06/06/21     HH Arranged: PT, OT, RN, Nurse's Aide, Social Work [employer] Agency: [place] Date [place]: 06/07/21 Time [place]: [phone] Representative spoke with at [place]: [place]   Social Determinants of Health ([place]) Interventions    Readmission Risk Interventions Readmission Risk Prevention Plan 04/26/2021  Transportation Screening Complete  PCP or Specialist Appt within 3-5 Days Complete  HRI or [place] Complete  Social Work Consult for [place] Planning/Counseling Complete  Palliative Care Screening Not Applicable  Medication Review Press photographer) Complete  Some recent data might be hidden

## 2021-06-12 NOTE — Progress Notes (Signed)
Occupational Therapy Treatment Patient Details Name: Jimmy West MRN: [id number] DOB: Jan 11, 1951 Today's Date: 06/12/2021   History of present illness 70 y.o. male was brought to hosp to admit 10/18 for hypoxia, weakness, SOB and noted sepsis from PNA, as well as NSTEMI.  Troponins are high from demand ischemia, EF 40-45%, and has recent asp PNA per chart.  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 Pt received semi reclined in bed, endorses just finishing lunch. Pt endorses aspirating previous night. Pt educated in aspiration prevention and need for more upright posture prior to eating/drinking. Pt placed in optimal seated bed position with HOB as close to 90* as possible. Pt verbalized understanding. Other Exercises: Pt further instructed in PLB (continues to breathe primarily through his mouth) and demo'd improvement with pulse oximeter, educated in how to use and where to purchase one. Pt educated in benefits of using w/c for room to room mobility at home to support energy conservation and minimize over exertion/desat in order to prioritize energy spent on meaningful occupational engagement for maximizing quality of life. Pt also instructed in pleasant imagery an distraction techniques to support stress mgt and minimize panic attacks/anxiety while minimizing SOB. Pt verbalized understanding. Pt continues to benefit from skilled OT services to maximize safety, independence, and quality of life.    Recommendations for follow up therapy are one component of a multi-disciplinary discharge planning process, led by the attending physician.  Recommendations may be updated based on patient status, additional functional criteria and insurance authorization.    Follow Up Recommendations  Other (comment) (SNF vs  transition home with [place] as appropriate)    Assistance Recommended at Discharge    Equipment Recommendations  BSC/3in1    Recommendations for Other Services      Precautions / Restrictions Precautions Precautions: Fall Precaution Comments: monitor O2 sats and HR; mod fall risk Restrictions Weight Bearing Restrictions: No       Mobility Bed Mobility                    Transfers                         Balance                                           ADL either performed or assessed with clinical judgement   ADL                                              Extremity/Trunk Assessment              Vision       Perception     Praxis      Cognition Arousal/Alertness: Awake/alert Behavior During Therapy: WFL for tasks assessed/performed Overall Cognitive Status: Within Functional Limits for tasks assessed  Exercises Other Exercises Other Exercises: Pt semi reclined in bed, endorses just finishing lunch. Pt endorses aspirating previous night. Pt educated in aspiration prevention and need for more upright posture prior to eating/drinking. Pt placed in optimal seated bed position with HOB as close to 90* as possible. Pt verbalized understanding. Other Exercises: Pt further instructed in PLB (continues to breathe primarily through his mouth) and demo'd improvement with pulse oximeter, educated in how to use and where to purchase one. Pt educated in benefits of using w/c for room to room mobility at home to support energy conservation and minimize over exertion/desat in order to prioritize energy spent on meaningful occupational engagement for maximizing quality of life. Pt also instructed in pleasant imagery an distraction techniques to support stress mgt and minimize panic attacks/anxiety while minimizing SOB. Pt verbalized understanding.   Shoulder  Instructions       General Comments      Pertinent Vitals/ Pain       Pain Assessment: No/denies pain  Home Living                                          Prior Functioning/Environment              Frequency  Min 2X/week        Progress Toward Goals  OT Goals(current goals can now be found in the care plan section)  Progress towards OT goals: Progressing toward goals;OT to reassess next treatment  Acute Rehab OT Goals Patient Stated Goal: get better OT Goal Formulation: With patient Time For Goal Achievement: 06/18/21 Potential to Achieve Goals: Good  Plan Frequency remains appropriate;Discharge plan remains appropriate    Co-evaluation                 AM-PAC OT "6 Clicks" Daily Activity     Outcome Measure   Help from another person eating meals?: None Help from another person taking care of personal grooming?: A Little Help from another person toileting, which includes using toliet, bedpan, or urinal?: A Little Help from another person bathing (including washing, rinsing, drying)?: A Little Help from another person to put on and taking off regular upper body clothing?: A Little Help from another person to put on and taking off regular lower body clothing?: A Little 6 Click Score: 19    End of Session Equipment Utilized During Treatment: Oxygen  OT Visit Diagnosis: Other abnormalities of gait and mobility (R26.89);Muscle weakness (generalized) (M62.81);Unsteadiness on feet (R26.81)   Activity Tolerance Patient tolerated treatment well   Patient Left in bed;with call bell/phone within reach;with bed alarm set   Nurse Communication          Time: 8657-8469 OT Time Calculation (min): 25 min  Charges: OT General Charges $OT Visit: 1 Visit OT Treatments $Self Care/Home Management : 8-22 mins $Therapeutic Activity: 8-22 mins  Ardeth Perfect., MPH, MS, OTR/L ascom [phone] 06/12/21, 2:49 PM

## 2021-06-13 DIAGNOSIS — J189 Pneumonia, unspecified organism: Secondary | ICD-10-CM

## 2021-06-13 DIAGNOSIS — J984 Other disorders of lung: Secondary | ICD-10-CM

## 2021-06-13 MED ORDER — SULFAMETHOXAZOLE-TRIMETHOPRIM 800-160 MG PO TABS: 1.0000 | ORAL_TABLET | ORAL | 0 refills | Status: AC

## 2021-06-13 MED ORDER — MORPHINE SULFATE ER 15 MG PO TBCR: 15.0000 mg | EXTENDED_RELEASE_TABLET | Freq: Two times a day (BID) | ORAL | 0 refills | Status: AC

## 2021-06-13 MED ORDER — PREDNISONE 10 MG PO TABS: ORAL_TABLET | ORAL | 0 refills | Status: AC

## 2021-06-13 MED ORDER — HEPARIN SOD (PORK) LOCK FLUSH 100 UNIT/ML IV SOLN
500.0000 [IU] | INTRAVENOUS | Status: AC | PRN
  Administered 2021-06-13: 500 [IU]
  Filled 2021-06-13: qty 5

## 2021-06-13 NOTE — Progress Notes (Signed)
Manufacturing engineer [facility]) [facility] Liaison Note  Hospital liaison following for planned discharge today, confirmed that larger concentrator has been delivered to the home.   Plan is for patient to discharge by private vehicle today.   Please do not hesitate to call for any hospice related questions or concerns  Thank you for the opportunity to participate in this patient's care.   Jhonnie Garner, BSN, Therapist, sports, [facility] hospital liaison [phone]

## 2021-06-13 NOTE — TOC Transition Note (Signed)
Transition of Care [facility]) - CM/SW Discharge Note   Patient Details  Name: Jimmy West MRN: [id number] Date of Birth: 20-May-1951  Transition of Care [facility]) CM/SW Contact:  Shelbie Hutching, RN Phone Number: 06/13/2021, 9:21 AM   Clinical Narrative:    Patient discharging home with [facility] today.  Oxygen was delivered yesterday to the bedside.  Patient has a friend that will be picking him up today.     Final next level of care: Home w Hospice Care Barriers to Discharge: No Barriers Identified   Patient Goals and CMS Choice Patient states their goals for this hospitalization and ongoing recovery are:: to go home CMS Medicare.gov Compare Post Acute Care list provided to:: Patient Choice offered to / list presented to : Patient  Discharge Placement                Patient to be transferred to facility by: [facility]   Patient and family notified of of transfer: 06/07/21  Discharge Plan and Services     Post Acute Care Choice: [place]          DME Arranged: Oxygen DME Agency: [facility] [place] Date DME Agency Contacted: 06/12/21     HH Arranged: NA [place] Agency: NA Date HH Agency Contacted: 06/07/21 Time [place]: [phone] Representative spoke with at [place]: NA  Social Determinants of Health ([place]) Interventions     Readmission Risk Interventions Readmission Risk Prevention Plan 04/26/2021  Transportation Screening Complete  PCP or Specialist Appt within 3-5 Days Complete  HRI or [place] Complete  Social Work Consult for [place] Planning/Counseling Complete  Palliative Care Screening Not Applicable  Medication Review Press photographer) Complete  Some recent data might be hidden

## 2021-06-13 NOTE — Discharge Summary (Addendum)
Physician Discharge Summary  Jimmy West ACZ:[id number] DOB: 02/09/1951 DOA: 05/07/2021  PCP: Pcp, No  Admit date: 05/07/2021 Discharge date: 06/13/2021  Discharge disposition: Home with hopsice   Recommendations for Outpatient Follow-Up:   Follow-up with hospice team within 24 hours of discharge   Discharge Diagnosis:   Active Problems:   Primary cancer of right lower lobe of lung ([place])   Acute hypoxemic respiratory failure (HCC)   SOB (shortness of breath)   Severe protein-calorie malnutrition (HCC)   Non-ST elevation (NSTEMI) myocardial infarction ([place])   Pneumonitis    Discharge Condition: Stable.  Diet recommendation:  Diet Order             Diet general           DIET DYS 3 Room service appropriate? Yes; Fluid consistency: Thin; Fluid restriction: 1500 mL Fluid  Diet effective now                     Code Status: DNR     Hospital Course:   [name] is a 70 y.o. 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  He also had acute on chronic hypoxemic respiratory failure.  He was treated with steroids.  Initially, he was treated with empiric antibiotics for suspected pneumonia.  Subsequently, pneumonia was ruled out.  He had AKI that improved with IV fluids.  He also had acute NSTEMI.  Because of his poor candidacy for cardiac catheterization, medical management was recommended.    He was evaluated by the oncologist who said that patient is no longer a candidate for additional chemotherapy/immunotherapy.  Palliative care team was consulted and patient opted for hospice care.  He was evaluated by  the hospice team and he was deemed to be a candidate for hospice care at home.  He is deemed stable for discharge to home with hospice.  Discharge plan, including discharge medications, were discussed with the patient.  He prefers to continue most of his medicines for now.  All his questions were answered.  He confirmed that he is transitioning to hospice.  However, he is planning to see an oncologist at [facility] for a second opinion.  His friend, [name], was on speaker phone with him during this encounter.  Medical Consultants:   Cardiologist Oncologist Pulmonologist/intensivist Palliative care team   Discharge Exam:    Vitals:   06/13/21 0035 06/13/21 0440 06/13/21 0731 06/13/21 1127  BP: 117/66 106/68 110/74 115/73  Pulse: 80 90 90 100  Resp: 18 20 20 20   Temp: (!) 97.5 F (36.4 C) 97.9 F (36.6 C) 98.3 F (36.8 C) 98.2 F (36.8 C)  TempSrc:   Oral Oral  SpO2: 100% 99% 98% 94%  Weight:      Height:         GEN: NAD SKIN: Warm and dry EYES: EOMI ENT: MMM CV: RRR PULM: Bibasilar rales.  No wheezing heard ABD: soft, ND, NT, +BS CNS: AAO x 3, non focal EXT: No edema or tenderness   The results of significant diagnostics from this hospitalization (including imaging, microbiology, ancillary and laboratory) are listed below for reference.     Procedures and Diagnostic  Studies:   ECHOCARDIOGRAM COMPLETE  Result Date: 05/08/2021    ECHOCARDIOGRAM REPORT   Patient Name:   Jimmy West Date of Exam: 05/08/2021 Medical Rec #:  [id number]            Height:       66.0 in Accession #:    [id number]           Weight:       128.7 lb Date of Birth:  [birth year]           BSA:          1.658 m Patient Age:    89 years             BP:           97/66 mmHg Patient Gender: M                    HR:           99 bpm. Exam Location:  [facility] Procedure: 2D Echo, Color Doppler and Cardiac Doppler Indications:     Elevated troponin  History:         Patient has no prior history  of Echocardiogram examinations.                  Stage 4 lung cancer.  Sonographer:     Charmayne Sheer Referring Phys:  [id number] [place] Diagnosing Phys: Serafina Royals MD  Sonographer Comments: No subcostal window and suboptimal apical window. IMPRESSIONS  1. Left ventricular ejection fraction, by estimation, is 55 to 60%. The left ventricle has normal function. The left ventricle has no regional wall motion abnormalities. Left ventricular diastolic parameters are consistent with Grade I diastolic dysfunction (impaired relaxation).  2. Right ventricular systolic function is normal. The right ventricular size is normal.  3. The mitral valve is normal in structure. Mild mitral valve regurgitation.  4. The aortic valve is normal in structure. Aortic valve regurgitation is mild. FINDINGS  Left Ventricle: Left ventricular ejection fraction, by estimation, is 55 to 60%. The left ventricle has normal function. The left ventricle has no regional wall motion abnormalities. The left ventricular internal cavity size was normal in size. There is  no left ventricular hypertrophy. Left ventricular diastolic parameters are consistent with Grade I diastolic dysfunction (impaired relaxation). Right Ventricle: The right ventricular size is normal. No increase in right ventricular wall thickness. Right ventricular systolic function is normal. Left Atrium: Left atrial size was normal in size. Right Atrium: Right atrial size was normal in size. Pericardium: There is no evidence of pericardial effusion. Mitral Valve: The mitral valve is normal in structure. Mild mitral valve regurgitation. MV peak gradient, 4.3 mmHg. The mean mitral valve gradient is 2.0 mmHg. Tricuspid Valve: The tricuspid valve is normal in structure. Tricuspid valve regurgitation is mild. Aortic Valve: The aortic valve is normal in structure. Aortic valve regurgitation is mild. Aortic valve mean gradient measures 4.0 mmHg. Aortic valve peak gradient measures 7.0 mmHg.  Aortic valve area, by VTI measures 3.72 cm. Pulmonic Valve: The pulmonic valve was normal in structure. Pulmonic valve regurgitation is not visualized. Aorta: The aortic root and ascending aorta are structurally normal, with no evidence of dilitation. IAS/Shunts: No atrial level shunt detected by color flow Doppler.  LEFT VENTRICLE PLAX 2D LVIDd:         4.80 cm   Diastology LVIDs:         3.20 cm   LV e' medial:  4.68 cm/s LV PW:         0.90 cm   LV E/e' medial:  14.5 LV IVS:        0.70 cm   LV e' lateral:   4.68 cm/s LVOT diam:     2.20 cm   LV E/e' lateral: 14.5 LV SV:         68 LV SV Index:   41 LVOT Area:     3.80 cm  LEFT ATRIUM             Index LA diam:        3.10 cm 1.87 cm/m LA Vol (A2C):   31.1 ml 18.75 ml/m LA Vol (A4C):   34.6 ml 20.86 ml/m LA Biplane Vol: 34.2 ml 20.62 ml/m  AORTIC VALVE                    PULMONIC VALVE AV Area (Vmax):    3.37 cm     PV Vmax:       1.13 m/s AV Area (Vmean):   3.31 cm     PV Vmean:      72.900 cm/s AV Area (VTI):     3.72 cm     PV VTI:        0.180 m AV Vmax:           132.00 cm/s  PV Peak grad:  5.1 mmHg AV Vmean:          90.300 cm/s  PV Mean grad:  3.0 mmHg AV VTI:            0.183 m AV Peak Grad:      7.0 mmHg AV Mean Grad:      4.0 mmHg LVOT Vmax:         117.00 cm/s LVOT Vmean:        78.700 cm/s LVOT VTI:          0.179 m LVOT/AV VTI ratio: 0.98  AORTA Ao Root diam: 3.50 cm MITRAL VALVE MV Area (PHT): 8.43 cm    SHUNTS MV Area VTI:   3.82 cm    Systemic VTI:  0.18 m MV Peak grad:  4.3 mmHg    Systemic Diam: 2.20 cm MV Mean grad:  2.0 mmHg MV Vmax:       1.04 m/s MV Vmean:      67.3 cm/s MV Decel Time: 90 msec MV E velocity: 67.70 cm/s MV A velocity: 96.40 cm/s MV E/A ratio:  0.70 Serafina Royals MD Electronically signed by Serafina Royals MD Signature Date/Time: 05/08/2021/1:34:35 PM    Final      Labs:   Basic Metabolic Panel: No results for input(s): NA, K, CL, CO2, GLUCOSE, BUN, CREATININE, CALCIUM, MG, PHOS in the last 168  hours. GFR Estimated Creatinine Clearance: 66.2 mL/min (by C-G formula based on SCr of 0.87 mg/dL). Liver Function Tests: No results for input(s): AST, ALT, ALKPHOS, BILITOT, PROT, ALBUMIN in the last 168 hours. No results for input(s): LIPASE, AMYLASE in the last 168 hours. No results for input(s): AMMONIA in the last 168 hours. Coagulation profile No results for input(s): INR, PROTIME in the last 168 hours.  CBC: No results for input(s): WBC, NEUTROABS, HGB, HCT, MCV, PLT in the last 168 hours. Cardiac Enzymes: No results for input(s): CKTOTAL, CKMB, CKMBINDEX, TROPONINI in the last 168 hours. BNP: Invalid input(s): POCBNP CBG: Recent Labs  Lab 06/11/21 0832 06/11/21 1149 06/11/21 1512 06/11/21 1648  GLUCAP 135* 195* 205*  142*   D-Dimer No results for input(s): DDIMER in the last 72 hours. Hgb A1c No results for input(s): HGBA1C in the last 72 hours. Lipid Profile No results for input(s): CHOL, HDL, LDLCALC, TRIG, CHOLHDL, LDLDIRECT in the last 72 hours. Thyroid function studies No results for input(s): TSH, T4TOTAL, T3FREE, THYROIDAB in the last 72 hours.  Invalid input(s): FREET3 Anemia work up No results for input(s): VITAMINB12, FOLATE, FERRITIN, TIBC, IRON, RETICCTPCT in the last 72 hours. Microbiology No results found for this or any previous visit (from the past 240 hour(s)).   Discharge Instructions:   Discharge Instructions     Diet general   Complete by: As directed    Discharge instructions   Complete by: As directed    Continue 5 to 6 L/min oxygen via nasal cannula.  Taper down oxygen as able.  Follow-up with hospice team within 24 hours of discharge   Increase activity slowly   Complete by: As directed       Allergies as of 06/13/2021   No Known Allergies      Medication List     STOP taking these medications    dexamethasone 4 MG tablet Commonly known as: DECADRON       TAKE these medications    albuterol 108 (90 Base) MCG/ACT  inhaler Commonly known as: VENTOLIN HFA Inhale 2 puffs into the lungs every 6 (six) hours as needed for wheezing or shortness of breath.   ALPRAZolam 1 MG tablet Commonly known as: Xanax Take 1 tablet (1 mg total) by mouth 3 (three) times daily as needed for anxiety.   Eliquis 5 MG Tabs tablet Generic drug: apixaban Take 1 tablet (5 mg total) by mouth 2 (two) times daily.   FLUoxetine 40 MG capsule Commonly known as: PROzac Take 1 capsule (40 mg total) by mouth daily.   folic acid 1 MG tablet Commonly known as: FOLVITE Take 1 tablet (1 mg total) by mouth daily. Continue until 21 days after [place] completed.   morphine 15 MG 12 hr tablet Commonly known as: MS CONTIN Take 1 tablet (15 mg total) by mouth every 12 (twelve) hours.   OLANZapine 10 MG tablet Commonly known as: ZYPREXA Take 1 tablet (10 mg total) by mouth at bedtime.   Oxycodone HCl 20 MG Tabs Take 1-1.5 tablets (20-30 mg total) by mouth every 4 (four) hours as needed.   polyethylene glycol 17 g packet Commonly known as: MIRALAX / GLYCOLAX Take 17 g by mouth daily.   predniSONE 10 MG tablet Commonly known as: DELTASONE Take 3 tablets (30 mg total) by mouth daily with breakfast for 7 days, THEN 2 tablets (20 mg total) daily with breakfast for 7 days, THEN 1 tablet (10 mg total) daily with breakfast for 7 days, THEN 0.5 tablets (5 mg total) daily with breakfast for 7 days. Start taking on: June 13, 2021   prochlorperazine 10 MG tablet Commonly known as: COMPAZINE Take 1 tablet (10 mg total) by mouth every 6 (six) hours as needed for nausea or vomiting.   senna 8.6 MG tablet Commonly known as: SENOKOT Take 1 tablet by mouth as needed.   sulfamethoxazole-trimethoprim 800-160 MG tablet Commonly known as: BACTRIM DS Take 1 tablet by mouth 3 (three) times a week. Start taking on: June 14, 2021               Durable Medical Equipment  (From admission, onward)           Start  Ordered    06/06/21 1530  For home use only DME oxygen  Once       Comments: Will need 4 L at rest, and 8-10 L with activity.  Question Answer Comment  Length of Need 6 Months   Mode or (Route) Nasal cannula   Liters per Minute 10   Frequency Continuous (stationary and portable oxygen unit needed)   Oxygen conserving device Yes   Oxygen delivery system Gas      06/06/21 1530   06/06/21 1522  For home use only DME 3 n 1  Once        06/06/21 1521            Follow-up Information     Kate Sable, MD. Go on 06/20/2021.   Specialties: Cardiology, Radiology Why: @ 11:20am Contact information: [place] [address] [phone]                   If you experience worsening of your admission symptoms, develop shortness of breath, life threatening emergency, suicidal or homicidal thoughts you must seek medical attention immediately by calling 911 or calling your MD immediately  if symptoms less severe.   You must read complete instructions/literature along with all the possible adverse reactions/side effects for all the medicines you take and that have been prescribed to you. Take any new medicines after you have completely understood and accept all the possible adverse reactions/side effects.    Please note   You were cared for by a hospitalist during your hospital stay. If you have any questions about your discharge medications or the care you received while you were in the hospital after you are discharged, you can call the unit and asked to speak with the hospitalist on call if the hospitalist that took care of you is not available. Once you are discharged, your primary care physician will handle any further medical issues. Please note that NO REFILLS for any discharge medications will be authorized once you are discharged, as it is imperative that you return to your primary care physician (or establish a relationship with a primary care physician if you do  not have one) for your aftercare needs so that they can reassess your need for medications and monitor your lab values.       Time coordinating discharge: 34 minutes  Signed:  Sadao Weyer  [facility] 06/13/2021, 3:03 PM   Pager on [email or web address]. If 7PM-7AM, please contact night-coverage at [email or web address]

## 2021-06-14 ENCOUNTER — Emergency Department

## 2021-06-14 ENCOUNTER — Inpatient Hospital Stay
Admission: EM | Admit: 2021-06-14 | Discharge: 2021-07-21 | DRG: 871 | Disposition: E | Attending: Internal Medicine | Admitting: Internal Medicine

## 2021-06-14 DIAGNOSIS — R54 Age-related physical debility: Secondary | ICD-10-CM | POA: Diagnosis present

## 2021-06-14 DIAGNOSIS — Z6821 Body mass index (BMI) 21.0-21.9, adult: Secondary | ICD-10-CM

## 2021-06-14 DIAGNOSIS — J1282 Pneumonia due to coronavirus disease 2019: Secondary | ICD-10-CM | POA: Diagnosis not present

## 2021-06-14 DIAGNOSIS — G893 Neoplasm related pain (acute) (chronic): Secondary | ICD-10-CM | POA: Diagnosis present

## 2021-06-14 DIAGNOSIS — Z86711 Personal history of pulmonary embolism: Secondary | ICD-10-CM

## 2021-06-14 DIAGNOSIS — T451X5A Adverse effect of antineoplastic and immunosuppressive drugs, initial encounter: Secondary | ICD-10-CM | POA: Diagnosis present

## 2021-06-14 DIAGNOSIS — J189 Pneumonia, unspecified organism: Secondary | ICD-10-CM | POA: Diagnosis present

## 2021-06-14 DIAGNOSIS — E440 Moderate protein-calorie malnutrition: Secondary | ICD-10-CM | POA: Insufficient documentation

## 2021-06-14 DIAGNOSIS — E43 Unspecified severe protein-calorie malnutrition: Secondary | ICD-10-CM | POA: Diagnosis not present

## 2021-06-14 DIAGNOSIS — D630 Anemia in neoplastic disease: Secondary | ICD-10-CM | POA: Diagnosis present

## 2021-06-14 DIAGNOSIS — C3431 Malignant neoplasm of lower lobe, right bronchus or lung: Secondary | ICD-10-CM | POA: Diagnosis present

## 2021-06-14 DIAGNOSIS — R0603 Acute respiratory distress: Secondary | ICD-10-CM | POA: Diagnosis not present

## 2021-06-14 DIAGNOSIS — J9601 Acute respiratory failure with hypoxia: Secondary | ICD-10-CM

## 2021-06-14 DIAGNOSIS — G9341 Metabolic encephalopathy: Secondary | ICD-10-CM | POA: Diagnosis present

## 2021-06-14 DIAGNOSIS — A419 Sepsis, unspecified organism: Secondary | ICD-10-CM | POA: Diagnosis present

## 2021-06-14 DIAGNOSIS — Z515 Encounter for palliative care: Secondary | ICD-10-CM | POA: Diagnosis not present

## 2021-06-14 DIAGNOSIS — Z79899 Other long term (current) drug therapy: Secondary | ICD-10-CM

## 2021-06-14 DIAGNOSIS — J80 Acute respiratory distress syndrome: Secondary | ICD-10-CM | POA: Diagnosis not present

## 2021-06-14 DIAGNOSIS — J9622 Acute and chronic respiratory failure with hypercapnia: Secondary | ICD-10-CM | POA: Diagnosis present

## 2021-06-14 DIAGNOSIS — C7951 Secondary malignant neoplasm of bone: Secondary | ICD-10-CM | POA: Diagnosis present

## 2021-06-14 DIAGNOSIS — R6520 Severe sepsis without septic shock: Secondary | ICD-10-CM | POA: Diagnosis present

## 2021-06-14 DIAGNOSIS — Z66 Do not resuscitate: Secondary | ICD-10-CM | POA: Diagnosis present

## 2021-06-14 DIAGNOSIS — R0602 Shortness of breath: Secondary | ICD-10-CM | POA: Diagnosis not present

## 2021-06-14 DIAGNOSIS — C3492 Malignant neoplasm of unspecified part of left bronchus or lung: Secondary | ICD-10-CM | POA: Diagnosis not present

## 2021-06-14 DIAGNOSIS — J9621 Acute and chronic respiratory failure with hypoxia: Secondary | ICD-10-CM | POA: Diagnosis not present

## 2021-06-14 DIAGNOSIS — C3490 Malignant neoplasm of unspecified part of unspecified bronchus or lung: Secondary | ICD-10-CM | POA: Diagnosis not present

## 2021-06-14 DIAGNOSIS — Z87891 Personal history of nicotine dependence: Secondary | ICD-10-CM

## 2021-06-14 DIAGNOSIS — Z743 Need for continuous supervision: Secondary | ICD-10-CM | POA: Diagnosis not present

## 2021-06-14 DIAGNOSIS — A4189 Other specified sepsis: Secondary | ICD-10-CM | POA: Diagnosis not present

## 2021-06-14 DIAGNOSIS — J439 Emphysema, unspecified: Secondary | ICD-10-CM | POA: Diagnosis not present

## 2021-06-14 DIAGNOSIS — I252 Old myocardial infarction: Secondary | ICD-10-CM | POA: Diagnosis not present

## 2021-06-14 DIAGNOSIS — R404 Transient alteration of awareness: Secondary | ICD-10-CM | POA: Diagnosis not present

## 2021-06-14 DIAGNOSIS — I21A1 Myocardial infarction type 2: Secondary | ICD-10-CM | POA: Diagnosis present

## 2021-06-14 DIAGNOSIS — I2510 Atherosclerotic heart disease of native coronary artery without angina pectoris: Secondary | ICD-10-CM | POA: Diagnosis present

## 2021-06-14 DIAGNOSIS — J704 Drug-induced interstitial lung disorders, unspecified: Secondary | ICD-10-CM | POA: Diagnosis present

## 2021-06-14 DIAGNOSIS — Y95 Nosocomial condition: Secondary | ICD-10-CM | POA: Diagnosis present

## 2021-06-14 DIAGNOSIS — F32A Depression, unspecified: Secondary | ICD-10-CM | POA: Diagnosis present

## 2021-06-14 DIAGNOSIS — Z7901 Long term (current) use of anticoagulants: Secondary | ICD-10-CM

## 2021-06-14 DIAGNOSIS — I214 Non-ST elevation (NSTEMI) myocardial infarction: Secondary | ICD-10-CM | POA: Diagnosis not present

## 2021-06-14 DIAGNOSIS — J069 Acute upper respiratory infection, unspecified: Secondary | ICD-10-CM | POA: Diagnosis present

## 2021-06-14 DIAGNOSIS — F419 Anxiety disorder, unspecified: Secondary | ICD-10-CM | POA: Diagnosis present

## 2021-06-14 DIAGNOSIS — R0689 Other abnormalities of breathing: Secondary | ICD-10-CM | POA: Diagnosis not present

## 2021-06-14 DIAGNOSIS — J9602 Acute respiratory failure with hypercapnia: Secondary | ICD-10-CM | POA: Diagnosis not present

## 2021-06-14 DIAGNOSIS — Z7952 Long term (current) use of systemic steroids: Secondary | ICD-10-CM | POA: Diagnosis not present

## 2021-06-14 DIAGNOSIS — Z809 Family history of malignant neoplasm, unspecified: Secondary | ICD-10-CM

## 2021-06-14 DIAGNOSIS — R0902 Hypoxemia: Secondary | ICD-10-CM | POA: Diagnosis not present

## 2021-06-14 DIAGNOSIS — Z955 Presence of coronary angioplasty implant and graft: Secondary | ICD-10-CM

## 2021-06-14 DIAGNOSIS — U071 COVID-19: Secondary | ICD-10-CM | POA: Diagnosis not present

## 2021-06-14 LAB — COMPREHENSIVE METABOLIC PANEL
ALT: 25 U/L (ref 0–44)
AST: 23 U/L (ref 15–41)
Albumin: 3.3 g/dL — ABNORMAL LOW (ref 3.5–5.0)
Alkaline Phosphatase: 108 U/L (ref 38–126)
Anion gap: 9 (ref 5–15)
BUN: 27 mg/dL — ABNORMAL HIGH (ref 8–23)
CO2: 36 mmol/L — ABNORMAL HIGH (ref 22–32)
Calcium: 9.5 mg/dL (ref 8.9–10.3)
Chloride: 96 mmol/L — ABNORMAL LOW (ref 98–111)
Creatinine, Ser: 1.03 mg/dL (ref 0.61–1.24)
GFR, Estimated: >60 mL/min (ref >60)
Glucose, Bld: 138 mg/dL — ABNORMAL HIGH (ref 70–99)
Potassium: 3.8 mmol/L (ref 3.5–5.1)
Sodium: 141 mmol/L (ref 135–145)
Total Bilirubin: 1.2 mg/dL (ref 0.3–1.2)
Total Protein: 7.1 g/dL (ref 6.5–8.1)

## 2021-06-14 LAB — BLOOD GAS, VENOUS
Acid-Base Excess: 11.2 mmol/L — ABNORMAL HIGH (ref 0.0–2.0)
Bicarbonate: 43.6 mmol/L — ABNORMAL HIGH (ref 20.0–28.0)
Delivery systems: POSITIVE
FIO2: 1
O2 Saturation: 27.6 %
PEEP: 8 cmH2O
Patient temperature: 37
Pressure support: 18 cmH2O
pCO2, Ven: 109 mmHg (ref 44.0–60.0)
pH, Ven: 7.21 — ABNORMAL LOW (ref 7.250–7.430)
pO2, Ven: <31 mmHg — CL (ref 32.0–45.0)

## 2021-06-14 LAB — CBC WITH DIFFERENTIAL/PLATELET
Abs Immature Granulocytes: 0.16 10*3/uL — ABNORMAL HIGH (ref 0.00–0.07)
Basophils Absolute: 0.1 10*3/uL (ref 0.0–0.1)
Basophils Relative: 0 %
Eosinophils Absolute: 0.6 10*3/uL — ABNORMAL HIGH (ref 0.0–0.5)
Eosinophils Relative: 2 %
HCT: 43.6 % (ref 39.0–52.0)
Hemoglobin: 13.1 g/dL (ref 13.0–17.0)
Immature Granulocytes: 1 %
Lymphocytes Relative: 3 %
Lymphs Abs: 0.7 10*3/uL (ref 0.7–4.0)
MCH: 30.5 pg (ref 26.0–34.0)
MCHC: 30 g/dL (ref 30.0–36.0)
MCV: 101.6 fL — ABNORMAL HIGH (ref 80.0–100.0)
Monocytes Absolute: 1.9 10*3/uL — ABNORMAL HIGH (ref 0.1–1.0)
Monocytes Relative: 8 %
Neutro Abs: 19.7 10*3/uL — ABNORMAL HIGH (ref 1.7–7.7)
Neutrophils Relative %: 86 %
Platelets: 309 10*3/uL (ref 150–400)
RBC: 4.29 MIL/uL (ref 4.22–5.81)
RDW: 15.9 % — ABNORMAL HIGH (ref 11.5–15.5)
WBC: 23.1 10*3/uL — ABNORMAL HIGH (ref 4.0–10.5)
nRBC: 0 % (ref 0.0–0.2)

## 2021-06-14 LAB — PROCALCITONIN: Procalcitonin: 1.04 ng/mL

## 2021-06-14 LAB — TROPONIN I (HIGH SENSITIVITY): Troponin I (High Sensitivity): 340 ng/L (ref <18)

## 2021-06-14 LAB — LIPASE, BLOOD: Lipase: 33 U/L (ref 11–51)

## 2021-06-14 LAB — BRAIN NATRIURETIC PEPTIDE: B Natriuretic Peptide: 770.2 pg/mL — ABNORMAL HIGH (ref 0.0–100.0)

## 2021-06-14 LAB — LACTIC ACID, PLASMA: Lactic Acid, Venous: 1.7 mmol/L (ref 0.5–1.9)

## 2021-06-14 MED ORDER — VANCOMYCIN HCL IN DEXTROSE 1-5 GM/200ML-% IV SOLN
1000.0000 mg | Freq: Once | INTRAVENOUS | Status: AC
  Administered 2021-06-14: 1000 mg via INTRAVENOUS
  Filled 2021-06-14: qty 200

## 2021-06-14 MED ORDER — SODIUM CHLORIDE 0.9 % IV SOLN
2.0000 g | Freq: Once | INTRAVENOUS | Status: AC
  Administered 2021-06-14: 2 g via INTRAVENOUS
  Filled 2021-06-14: qty 2

## 2021-06-14 NOTE — H&P (Addendum)
History and Physical    Mumin Denomme GUR:[id number] DOB: 18-Jul-1951 DOA: 05/30/2021  PCP: Pcp, No   Patient coming from: home  I have personally briefly reviewed patient's relevant medical records in [place]  Chief Complaint: shortness of breath  HPI: Havard Radigan is a 70 y.o. male with medical history significant for Stage IV lung cancer, chronic constipation, CAD s/p stent 2005, chronic respiratory failure on home O2 at 6-8 L, anxiety and depression, s/p prolonged hospitalization from 10/18-11/24 for Keytruda induced pneumonitis, NSTEMI managed medically and discharged to hospice who returns to the ED 1 day post discharge, in respiratory distress on BiPAP with EMS reported O2 sats in the 70s on O2 at home flow rate.  History is given by a friend at bedside who stated that patient was feeling well at discharge the day prior as well as earlier in the day when he had breakfast.  He was seen by the hospice nurse that day and was doing well and was walking around the house without problems.She states he had not been coughing, did not have a fever and seemed to be at his baseline. later in the day, she came into the house to find him struggling to breathe and looking cyanotic with O2 sats in the 50s while on O2 at 7 L on which he was discharged.  She admits that patient got none of his medications since discharge because all pharmacies were closed for the Thanksgiving holiday.  He did take medications that he had prior to his prolonged hospitalization including oxycodone and Xanax.    ED course: On arrival O2 sat on BiPAP 95%, tachycardic at 106 with respirations 22, BP 133/73, temp 99.2 Blood work: WBC 23,000 with lactic acid 1.7 and procalcitonin 1.04 VBG on FiO2 of 1 with pH 7.21, PCO2 109 Troponin 340 with BNP 770 CMP mostly unremarkable  EKG, personally reviewed and interpreted: Sinus tachycardia at 106 with no acute ST-T wave changes  Imaging: Chest x-ray with  progression of bilateral mid to lower lung field airspace opacities. Which may represent interval development of pneumonia on background of pulmonary fibrosis, compared to chest x-ray from 11/8 and CT 11/9  Patient was treated with vancomycin and cefepime.  ED provider spoke with friend and POA confirming DNR/DNI status.  Hospitalist consulted for admission.    Review of Systems: Unable to obtain due to clinical condition  Past Medical History:  Diagnosis Date   Bulging lumbar disc    Cancer ([place])    stage 4 lung cancer   Heart attack [facility])     Past Surgical History:  Procedure Laterality Date   CARDIAC CATHETERIZATION     two stents   KNEE SURGERY Left    PORTA CATH INSERTION N/A 05/21/2018   Procedure: PORTA CATH INSERTION;  Surgeon: Algernon Huxley, MD;  Location: [place] [facility];  Service: Cardiovascular;  Laterality: N/A;     reports that he quit smoking about 17 years ago. His smoking use included cigarettes. He has a 22.50 pack-year smoking history. He has never used smokeless tobacco. He reports that he does not currently use alcohol. He reports that he does not use drugs.  No Known Allergies  Family History  Problem Relation Age of Onset   Heart failure Father    Cancer Maternal Aunt    Heart failure Maternal Uncle    Dementia Paternal Grandmother    Cancer Maternal Aunt       Prior to Admission medications  Medication Sig Start Date End Date Taking? Authorizing Provider  albuterol (VENTOLIN HFA) 108 (90 Base) MCG/ACT inhaler Inhale 2 puffs into the lungs every 6 (six) hours as needed for wheezing or shortness of breath. 04/09/21   Cammie Sickle, MD  ALPRAZolam Duanne Moron) 1 MG tablet Take 1 tablet (1 mg total) by mouth 3 (three) times daily as needed for anxiety. 04/29/21   Borders, Kirt Boys, NP  apixaban (ELIQUIS) 5 MG TABS tablet Take 1 tablet (5 mg total) by mouth 2 (two) times daily. 10/23/20   Lequita Asal, MD  FLUoxetine (PROZAC) 40 MG capsule Take  1 capsule (40 mg total) by mouth daily. 05/03/21   Borders, Kirt Boys, NP  folic acid (FOLVITE) 1 MG tablet Take 1 tablet (1 mg total) by mouth daily. Continue until 21 days after [place] completed. 12/03/20   Borders, Kirt Boys, NP  morphine (MS CONTIN) 15 MG 12 hr tablet Take 1 tablet (15 mg total) by mouth every 12 (twelve) hours. 06/13/21   Jennye Boroughs, MD  OLANZapine (ZYPREXA) 10 MG tablet Take 1 tablet (10 mg total) by mouth at bedtime. 04/03/21   Borders, Kirt Boys, NP  Oxycodone HCl 20 MG TABS Take 1-1.5 tablets (20-30 mg total) by mouth every 4 (four) hours as needed. 05/20/21   Borders, Kirt Boys, NP  polyethylene glycol (MIRALAX / GLYCOLAX) packet Take 17 g by mouth daily.    [provider]  predniSONE (DELTASONE) 10 MG tablet Take 3 tablets (30 mg total) by mouth daily with breakfast for 7 days, THEN 2 tablets (20 mg total) daily with breakfast for 7 days, THEN 1 tablet (10 mg total) daily with breakfast for 7 days, THEN 0.5 tablets (5 mg total) daily with breakfast for 7 days. 06/13/21 07/11/21  Jennye Boroughs, MD  prochlorperazine (COMPAZINE) 10 MG tablet Take 1 tablet (10 mg total) by mouth every 6 (six) hours as needed for nausea or vomiting. 10/10/20   Borders, Kirt Boys, NP  senna (SENOKOT) 8.6 MG tablet Take 1 tablet by mouth as needed.     [provider]  sulfamethoxazole-trimethoprim (BACTRIM DS) 800-160 MG tablet Take 1 tablet by mouth 3 (three) times a week. 06/11/2021   Jennye Boroughs, MD    Physical Exam: Vitals:   05/30/2021 2022 05/22/2021 2023 06/11/2021 2200 06/01/2021 2227  BP: 133/73  98/68   Pulse: (!) 106  (!) 103 93  Resp: 19   (!) 22  Temp:    99.2 F (37.3 C)  TempSrc:    Rectal  SpO2: 95%  99% 100%  Weight:  60.1 kg    Height:  5\' 6"  (1.676 m)     Constitutional: Frail and chronically ill-appearing lethargic, on BiPAP, in moderate respiratory distress HEENT:      Head: Normocephalic and atraumatic.         Eyes: PERLA, EOMI, Conjunctivae are  normal. Sclera is non-icteric.       Mouth/Throat: Not examined     neck: Supple with no signs of meningismus. Cardiovascular: Tachycardic. No murmurs, gallops, or rubs. 2+ symmetrical distal pulses are present . No JVD. No   LE edema Respiratory: Respiratory effort increased.Lungs sounds coarse bilaterally.  Bilateral wheezes Gastrointestinal: Soft, non tender , non distended. Positive bowel sounds.  Genitourinary: No CVA tenderness. Musculoskeletal: Nontender with normal range of motion in all extremities . No cyanosis, or erythema of extremities. Neurologic:  Face is symmetric. Moving all extremities. No gross focal neurologic deficits  . Skin: Skin is  warm, dry.  No rash or ulcers  Psychiatric: Mood and affect are appropriate     Labs on Admission: I have personally reviewed following labs and imaging studies  CBC: Recent Labs  Lab 05/27/2021 2122  WBC 23.1*  NEUTROABS 19.7*  HGB 13.1  HCT 43.6  MCV 101.6*  PLT 761   Basic Metabolic Panel: Recent Labs  Lab 06/01/2021 2122  NA 141  K 3.8  CL 96*  CO2 36*  GLUCOSE 138*  BUN 27*  CREATININE 1.03  CALCIUM 9.5   GFR: Estimated Creatinine Clearance: 55.9 mL/min (by C-G formula based on SCr of 1.03 mg/dL). Liver Function Tests: Recent Labs  Lab 05/21/2021 2122  AST 23  ALT 25  ALKPHOS 108  BILITOT 1.2  PROT 7.1  ALBUMIN 3.3*   Recent Labs  Lab 06/04/2021 2122  LIPASE 33   No results for input(s): AMMONIA in the last 168 hours. Coagulation Profile: No results for input(s): INR, PROTIME in the last 168 hours. Cardiac Enzymes: No results for input(s): CKTOTAL, CKMB, CKMBINDEX, TROPONINI in the last 168 hours. BNP (last 3 results) No results for input(s): PROBNP in the last 8760 hours. HbA1C: No results for input(s): HGBA1C in the last 72 hours. CBG: Recent Labs  Lab 06/11/21 0832 06/11/21 1149 06/11/21 1512 06/11/21 1648  GLUCAP 135* 195* 205* 142*   Lipid Profile: No results for input(s): CHOL, HDL,  LDLCALC, TRIG, CHOLHDL, LDLDIRECT in the last 72 hours. Thyroid Function Tests: No results for input(s): TSH, T4TOTAL, FREET4, T3FREE, THYROIDAB in the last 72 hours. Anemia Panel: No results for input(s): VITAMINB12, FOLATE, FERRITIN, TIBC, IRON, RETICCTPCT in the last 72 hours. Urine analysis:    Component Value Date/Time   COLORURINE YELLOW (A) 05/07/2021 2044   APPEARANCEUR CLEAR (A) 05/07/2021 2044   LABSPEC 1.021 05/07/2021 2044   PHURINE 6.0 05/07/2021 2044   GLUCOSEU NEGATIVE 05/07/2021 2044   HGBUR NEGATIVE 05/07/2021 2044   BILIRUBINUR NEGATIVE 05/07/2021 2044   [place] 05/07/2021 2044   PROTEINUR NEGATIVE 05/07/2021 2044   NITRITE NEGATIVE 05/07/2021 2044   LEUKOCYTESUR NEGATIVE 05/07/2021 2044    Radiological Exams on Admission: DG Chest Portable 1 View  Result Date: 05/22/2021 CLINICAL DATA:  Shortness of breath. EXAM: PORTABLE CHEST 1 VIEW COMPARISON:  Chest radiograph dated 05/28/2021 and CT dated 05/29/2021. FINDINGS: Right-sided Port-A-Cath in similar position. Bilateral interstitial coarsening and bronchitic changes and nodularity. Large areas of airspace opacities involving the mid to lower lung field have progressed since the prior radiograph and may represent interval development of pneumonia on background of pulmonary fibrosis. There is emphysematous changes of the lungs. Stable cardiomediastinal silhouette. No acute osseous pathology. IMPRESSION: Progression of bilateral mid to lower lung field airspace opacities compared to the prior radiograph. Electronically Signed   By: Anner Crete M.D.   On: 05/23/2021 20:57    Assessment/Plan    Acute on chronic respiratory failure with hypoxia and hypercapnia (HCC) - Differential includes acute PE, NSTEMI, possible acute heart failure secondary to NSTEMI,  as well as HCAP/aspiration, +/-pain meds - Patient presents in acute respiratory distress, hypoxic to the 50s, hypercapnic on VBG and requiring BiPAP,  and of apparent sudden onset - Continue BiPAP and wean as tolerated - Treat possible etiologies as outlined below    NSTEMI  CAD with stent 2005 and NSTEMI 05/07/2021 - Troponin 340, EKG nonacute - Patient had NSTEMI during recent hospitalization with troponin peak of 3500 treated medically as he was not considered a candidate for cath -  Heparin infusion and resume antiplatelets, statins and beta-blockers - Echocardiogram to evaluate for wall motion abnormality after discussing goals of care - Cardiology consult  Possible acute on chronic diastolic CHF - BNP 741, with bilateral airspace disease on chest x-ray ? - Echo in 11/8 showed G1 DD - Daily weights with intake and output monitoring  Suspect acute PE - Heparin infusion as above - Can get CTA chest when more stable to lie flat for scan  Covid pneumonia (post admission result) HCAP - WBC 23,000.  Last WBC was 9.2 on 11/16 - Continue cefepime and vancomycin --(remdesivir, steroids added post admission) - Supplemental O2 as needed to keep sats over 92% - Follow blood cultures    Primary cancer of right lower lobe of lung  - No longer candidate for chemo/immunotherapy per recent documentation - History of pneumonitis from [facility] - Patient was discharged to hospice but plans to follow-up at [facility], but friend at bedside  Chronic cancer pain - Judicious use of narcotics and sedating meds given worsening respiratory failure    DVT prophylaxis: Heparin infusion Code Status: DNR/DNI Family Communication: Friend at bedside.  Patient's condition at length as well as poor prognosis.  Friend verbalizes understanding but wants everything done short of intubation and resuscitation Disposition Plan: Back to previous home environment Consults called: none  Status:At the time of admission, it appears that the appropriate admission status for this patient is INPATIENT. This is judged to be reasonable and necessary in order to provide the  required intensity of service to ensure the patient's safety given the presenting symptoms, physical exam findings, and initial radiographic and laboratory data in the context of their  Comorbid conditions.   Patient requires inpatient status due to high intensity of service, high risk for further deterioration and high frequency of surveillance required.   I certify that at the point of admission it is my clinical judgment that the patient will require inpatient hospital care spanning beyond [place] MD [facility]   06/08/2021, 11:21 PM

## 2021-06-14 NOTE — Progress Notes (Signed)
CODE SEPSIS - PHARMACY COMMUNICATION  **Broad Spectrum Antibiotics should be administered within 1 hour of Sepsis diagnosis**  Time Code Sepsis Called/Page Received: 2116  Antibiotics Ordered: Cefepime and vancomycin  Time of 1st antibiotic administration: 2148    Sherilyn Banker ,PharmD Clinical Pharmacist  06/05/2021  9:26 PM

## 2021-06-14 NOTE — ED Notes (Signed)
Critical Troponin given to [facility] MD

## 2021-06-14 NOTE — ED Triage Notes (Signed)
Comes from home CC Resp distress. Home o2 70%.  Non rebreather then c-pap applied via ems o2 up  to 93%. Pt responds to painful stimuli.

## 2021-06-14 NOTE — ED Provider Notes (Signed)
[facility] Emergency Department Provider Note  ____________________________________________   Event Date/Time   First MD Initiated Contact with Patient 05/23/2021 2011     (approximate)  I have reviewed the triage vital signs and the nursing notes.   HISTORY  Chief Complaint Respiratory Distress (Home 02 70% hx of Stage 4 lung ca/)    HPI Jimmy West is a 70 y.o. male with stage IV lung cancer who comes in with severe shortness of breath.  Patient had sudden onset of shortness of breath.  Oxygen levels in the 70s.  Unclear how much oxygen he uses at home.  Patient is withdrawing to pain and sats are in the 110s 100% BiPAP.  Unable to get full HPI due to respiratory distress          Past Medical History:  Diagnosis Date   Bulging lumbar disc    Cancer ([place])    stage 4 lung cancer   Heart attack [facility])     Patient Active Problem List   Diagnosis Date Noted   Pneumonitis 06/13/2021   Severe protein-calorie malnutrition ([place]) 05/11/2021   Non-ST elevation (NSTEMI) myocardial infarction ([place]) 05/11/2021   SOB (shortness of breath)    Acute hypoxemic respiratory failure ([place]) 05/07/2021   Pneumonia 04/25/2021   Acute anemia 04/24/2021   Acute hypokalemia 04/24/2021   NSTEMI (non-ST elevated myocardial infarction) ([place]) 04/24/2021   Diarrhea 04/24/2021   Hypercalcemia 11/26/2020   Nonintractable headache 07/01/2020   Chronic anticoagulation 05/02/2020   Pleuritic pain 03/26/2020   Chemotherapy-induced neutropenia ([place]) 01/06/2020   Depression 12/21/2019   Palliative care encounter 08/26/2019   Anemia due to antineoplastic chemotherapy 06/27/2019   C. difficile diarrhea 06/26/2019   Pulmonary embolus ([place]) 06/20/2019   Anxiety in cancer patient 03/29/2019   Renal insufficiency 03/29/2019   Other fatigue 03/04/2019   Vomiting 03/04/2019   Chemotherapy induced nausea and vomiting 02/12/2019   Abdominal pain 02/11/2019   Mild  protein-calorie malnutrition ([place]) 10/10/2018   Weight loss 09/20/2018   Elevated bilirubin 07/14/2018   Encounter for antineoplastic chemotherapy 05/24/2018   Encounter for antineoplastic immunotherapy 05/24/2018   Goals of care, counseling/discussion 05/17/2018   Cancer-related pain 05/01/2018   Bone metastasis ([place]) 04/30/2018   Primary cancer of right lower lobe of lung ([place]) 04/26/2018   Chronic midline low back pain without sciatica 04/26/2018    Past Surgical History:  Procedure Laterality Date   CARDIAC CATHETERIZATION     two stents   KNEE SURGERY Left    PORTA CATH INSERTION N/A 05/21/2018   Procedure: PORTA CATH INSERTION;  Surgeon: Algernon Huxley, MD;  Location: [facility];  Service: Cardiovascular;  Laterality: N/A;    Prior to Admission medications   Medication Sig Start Date End Date Taking? Authorizing Provider  albuterol (VENTOLIN HFA) 108 (90 Base) MCG/ACT inhaler Inhale 2 puffs into the lungs every 6 (six) hours as needed for wheezing or shortness of breath. 04/09/21   Cammie Sickle, MD  ALPRAZolam Duanne Moron) 1 MG tablet Take 1 tablet (1 mg total) by mouth 3 (three) times daily as needed for anxiety. 04/29/21   Borders, Kirt Boys, NP  apixaban (ELIQUIS) 5 MG TABS tablet Take 1 tablet (5 mg total) by mouth 2 (two) times daily. 10/23/20   Lequita Asal, MD  FLUoxetine (PROZAC) 40 MG capsule Take 1 capsule (40 mg total) by mouth daily. 05/03/21   Borders, Kirt Boys, NP  folic acid (FOLVITE) 1 MG tablet Take 1  tablet (1 mg total) by mouth daily. Continue until 21 days after [place] completed. 12/03/20   Borders, Kirt Boys, NP  morphine (MS CONTIN) 15 MG 12 hr tablet Take 1 tablet (15 mg total) by mouth every 12 (twelve) hours. 06/13/21   Jennye Boroughs, MD  OLANZapine (ZYPREXA) 10 MG tablet Take 1 tablet (10 mg total) by mouth at bedtime. 04/03/21   Borders, Kirt Boys, NP  Oxycodone HCl 20 MG TABS Take 1-1.5 tablets (20-30 mg total) by mouth every 4 (four) hours  as needed. 05/20/21   Borders, Kirt Boys, NP  polyethylene glycol (MIRALAX / GLYCOLAX) packet Take 17 g by mouth daily.    [provider]  predniSONE (DELTASONE) 10 MG tablet Take 3 tablets (30 mg total) by mouth daily with breakfast for 7 days, THEN 2 tablets (20 mg total) daily with breakfast for 7 days, THEN 1 tablet (10 mg total) daily with breakfast for 7 days, THEN 0.5 tablets (5 mg total) daily with breakfast for 7 days. 06/13/21 07/11/21  Jennye Boroughs, MD  prochlorperazine (COMPAZINE) 10 MG tablet Take 1 tablet (10 mg total) by mouth every 6 (six) hours as needed for nausea or vomiting. 10/10/20   Borders, Kirt Boys, NP  senna (SENOKOT) 8.6 MG tablet Take 1 tablet by mouth as needed.     [provider]  sulfamethoxazole-trimethoprim (BACTRIM DS) 800-160 MG tablet Take 1 tablet by mouth 3 (three) times a week. 06/16/2021   Jennye Boroughs, MD    Allergies Patient has no known allergies.  Family History  Problem Relation Age of Onset   Heart failure Father    Cancer Maternal Aunt    Heart failure Maternal Uncle    Dementia Paternal Grandmother    Cancer Maternal Aunt     Social History Social History   Tobacco Use   Smoking status: Former    Packs/day: 1.50    Years: 15.00    Pack years: 22.50    Types: Cigarettes    Quit date: 11/03/2003    Years since quitting: 17.6   Smokeless tobacco: Never  Vaping Use   Vaping Use: Never used  Substance Use Topics   Alcohol use: Not Currently   Drug use: No      Review of Systems Unable to get full review of systems due to respiratory distress ____________________________________________   PHYSICAL EXAM:  VITAL SIGNS: ED Triage Vitals  Enc Vitals Group     BP      Pulse      Resp      Temp      Temp src      SpO2      Weight      Height      Head Circumference      Peak Flow      Pain Score      Pain Loc      Pain Edu?      Excl. in [place]?     Constitutional: Increased work of breathing Eyes:  Conjunctivae are normal. EOMI. Head: Atraumatic. Nose: No congestion/rhinnorhea. Mouth/Throat: Mucous membranes are moist.   Neck: No stridor. Trachea Midline. FROM Cardiovascular: Normal rate, regular rhythm. Grossly normal heart sounds.  Good peripheral circulation. Respiratory: Increased work of breathing, crackles Gastrointestinal: Soft and nontender. No distention. No abdominal bruits.  Musculoskeletal: No lower extremity tenderness nor edema.  No joint effusions. Neurologic: Patient opens his eyes with moving over from the stretcher to the bed.  He withdraws to pain Skin:  Skin is warm, dry and intact. No rash noted. GU: Deferred   ____________________________________________   LABS (all labs ordered are listed, but only abnormal results are displayed)  Labs Reviewed  CBC WITH DIFFERENTIAL/PLATELET  COMPREHENSIVE METABOLIC PANEL  LIPASE, BLOOD  BLOOD GAS, VENOUS  TROPONIN I (HIGH SENSITIVITY)   ____________________________________________   ED ECG REPORT I, Vanessa [place], the attending physician, personally viewed and interpreted this ECG.  Sinus tachycardia rate of 106, no ST elevation, no T wave versions, normal intervals ____________________________________________  RADIOLOGY Robert Bellow, personally viewed and evaluated these images (plain radiographs) as part of my medical decision making, as well as reviewing the written report by the radiologist.  ED MD interpretation: Diffuse opacifications bilaterally.  Official radiology report(s): DG Chest Portable 1 View  Result Date: 06/06/2021 CLINICAL DATA:  Shortness of breath. EXAM: PORTABLE CHEST 1 VIEW COMPARISON:  Chest radiograph dated 05/28/2021 and CT dated 05/29/2021. FINDINGS: Right-sided Port-A-Cath in similar position. Bilateral interstitial coarsening and bronchitic changes and nodularity. Large areas of airspace opacities involving the mid to lower lung field have progressed since the prior radiograph  and may represent interval development of pneumonia on background of pulmonary fibrosis. There is emphysematous changes of the lungs. Stable cardiomediastinal silhouette. No acute osseous pathology. IMPRESSION: Progression of bilateral mid to lower lung field airspace opacities compared to the prior radiograph. Electronically Signed   By: Anner Crete M.D.   On: 06/16/2021 20:57    ____________________________________________   PROCEDURES  Procedure(s) performed (including Critical Care):  Ultrasound ED Peripheral IV (Provider)  Date/Time: 05/22/2021 9:21 PM Performed by: Vanessa [place], MD Authorized by: Vanessa [place], MD   Procedure details:    Indications: hydration     Skin Prep: chlorhexidine gluconate     Location:  Right AC   Angiocath:  20 G   Bedside Ultrasound Guided: Yes     Images: not archived     Patient tolerated procedure without complications: Yes     Dressing applied: Yes   .Critical Care Performed by: Vanessa [place], MD Authorized by: Vanessa [place], MD   Critical care provider statement:    Critical care time (minutes):  45   Critical care was necessary to treat or prevent imminent or life-threatening deterioration of the following conditions:  Respiratory failure   Critical care was time spent personally by me on the following activities:  Development of treatment plan with patient or surrogate, discussions with consultants, evaluation of patient's response to treatment, examination of patient, ordering and review of laboratory studies, ordering and review of radiographic studies, ordering and performing treatments and interventions, pulse oximetry, re-evaluation of patient's condition and review of old charts .1-3 Lead EKG Interpretation Performed by: Vanessa [place], MD Authorized by: Vanessa , MD     Interpretation: abnormal     ECG rate:  100s   ECG rate assessment: tachycardic     Rhythm: sinus tachycardia     Ectopy: none     Conduction: normal      ____________________________________________   INITIAL IMPRESSION / ASSESSMENT AND PLAN / ED COURSE   Rakan Soffer was evaluated in Emergency Department on 05/30/2021 for the symptoms described in the history of present illness. He was evaluated in the context of the global COVID-19 pandemic, which necessitated consideration that the patient might be at risk for infection with the SARS-CoV-2 virus that causes COVID-19. Institutional protocols and algorithms that pertain to the evaluation of patients at risk for  COVID-19 are in a state of rapid change based on information released by regulatory bodies including the CDC and federal and state organizations. These policies and algorithms were followed during the patient's care in the ED.    Patient in severe respiratory distress but with DNR form.  At place and placed on BiPAP.  Patient withdrawing to pain.  Pt presents with SOB. Differential includes: PNA-will get xray to evaluation Anemia-CBC to evaluate ACS- will get trops Arrhythmia-Will get EKG and keep on monitor.  COVID- will get testing per algorithm. PE-lower suspicion given no risk factors and other cause more likely patient's on Eliquis   I called Ms. Cates she is listed as the friend but she states that she is the POA.  Patient comes with a DNR order with plans to transition to hospice however she states that she would want everything done other than intubation or resuscitation if cardiac arrest in keeping in line with patient's wishes.  He would never want to be on a ventilator.  Explained to family that he was on max support of BiPAP and that he is severely ill but they want to continue supporting him to see if he can get through this  Patient having periods of apnea.  CO2 significantly elevated.  I have updated POA multiple times about patient's critical condition  Patient reportedly more responsive now on the BiPAP.  White count elevated therefore start on blood  cultures, lactate.  Broad-spectrum antibiotics started.  Hold off on fluid resuscitation due to stable blood pressures and concern because fluid overload given elevated BNP.  If he is dropping blood pressures can trial some fluid boluses but at this time vitals are stable and he has normal lactate.  Patient was admitted to the hospital team     ____________________________________________   FINAL CLINICAL IMPRESSION(S) / ED DIAGNOSES   Final diagnoses:  Respiratory distress  Stage 4 malignant neoplasm of lung, unspecified laterality ([place])  Healthcare-associated pneumonia     MEDICATIONS GIVEN DURING THIS VISIT:  Medications  vancomycin (VANCOCIN) IVPB 1000 mg/200 mL premix (1,000 mg Intravenous New Bag/Given 06/10/2021 2222)  ceFEPIme (MAXIPIME) 2 g in sodium chloride 0.9 % 100 mL IVPB (0 g Intravenous Stopped 05/28/2021 2218)     ED Discharge Orders     None        Note:  This document was prepared using Dragon voice recognition software and may include unintentional dictation errors.   Vanessa [place], MD 05/30/2021 [phone]

## 2021-06-14 NOTE — Telephone Encounter (Signed)
Patient was discharged on 06/13/2021 under hospice care.  Dr. Patsey Berthold, is f/u needed?

## 2021-06-14 NOTE — Progress Notes (Signed)
PHARMACY -  BRIEF ANTIBIOTIC NOTE   Pharmacy has received consult(s) for vancomycin and cefepime from an ED provider.  The patient's profile has been reviewed for ht/wt/allergies/indication/available labs.    One time order(s) placed for: Cefepime 2 g Vancomycin 1 g  Further antibiotics/pharmacy consults should be ordered by admitting physician if indicated.                       Thank you, Jimmy West 06/02/2021  9:25 PM

## 2021-06-15 ENCOUNTER — Encounter: Payer: Self-pay | Admitting: Internal Medicine

## 2021-06-15 ENCOUNTER — Other Ambulatory Visit: Payer: Self-pay

## 2021-06-15 DIAGNOSIS — J9621 Acute and chronic respiratory failure with hypoxia: Secondary | ICD-10-CM

## 2021-06-15 DIAGNOSIS — J9622 Acute and chronic respiratory failure with hypercapnia: Secondary | ICD-10-CM | POA: Diagnosis not present

## 2021-06-15 DIAGNOSIS — A419 Sepsis, unspecified organism: Secondary | ICD-10-CM | POA: Diagnosis present

## 2021-06-15 DIAGNOSIS — G9341 Metabolic encephalopathy: Secondary | ICD-10-CM | POA: Diagnosis present

## 2021-06-15 DIAGNOSIS — J1282 Pneumonia due to coronavirus disease 2019: Secondary | ICD-10-CM

## 2021-06-15 DIAGNOSIS — U071 COVID-19: Secondary | ICD-10-CM

## 2021-06-15 DIAGNOSIS — J069 Acute upper respiratory infection, unspecified: Secondary | ICD-10-CM | POA: Diagnosis present

## 2021-06-15 LAB — RESP PANEL BY RT-PCR (FLU A&B, COVID) ARPGX2
Influenza A by PCR: NEGATIVE
Influenza B by PCR: NEGATIVE
SARS Coronavirus 2 by RT PCR: POSITIVE — AB

## 2021-06-15 LAB — TROPONIN I (HIGH SENSITIVITY)
Troponin I (High Sensitivity): 798 ng/L (ref <18)
Troponin I (High Sensitivity): 797 ng/L (ref <18)
Troponin I (High Sensitivity): 757 ng/L (ref <18)

## 2021-06-15 LAB — PROTIME-INR
INR: 1.1 (ref 0.8–1.2)
Prothrombin Time: 13.9 seconds (ref 11.4–15.2)

## 2021-06-15 LAB — LACTIC ACID, PLASMA: Lactic Acid, Venous: 1.3 mmol/L (ref 0.5–1.9)

## 2021-06-15 LAB — APTT: aPTT: 31 seconds (ref 24–36)

## 2021-06-15 LAB — PROCALCITONIN: Procalcitonin: 22.44 ng/mL

## 2021-06-15 LAB — HEPARIN LEVEL (UNFRACTIONATED): Heparin Unfractionated: 0.69 IU/mL (ref 0.30–0.70)

## 2021-06-15 MED ORDER — CHLORHEXIDINE GLUCONATE CLOTH 2 % EX PADS
6.0000 | MEDICATED_PAD | Freq: Every day | CUTANEOUS | Status: DC
  Administered 2021-06-17 – 2021-06-24 (×7): 6 via TOPICAL
  Filled 2021-06-15 (×2): qty 6

## 2021-06-15 MED ORDER — VANCOMYCIN HCL 1250 MG/250ML IV SOLN
1250.0000 mg | INTRAVENOUS | Status: DC
  Filled 2021-06-15: qty 250

## 2021-06-15 MED ORDER — METHYLPREDNISOLONE SODIUM SUCC 40 MG IJ SOLR
40.0000 mg | Freq: Two times a day (BID) | INTRAMUSCULAR | Status: AC
  Administered 2021-06-15 – 2021-06-19 (×10): 40 mg via INTRAVENOUS
  Filled 2021-06-15 (×10): qty 1

## 2021-06-15 MED ORDER — ONDANSETRON 4 MG PO TBDP
4.0000 mg | ORAL_TABLET | Freq: Four times a day (QID) | ORAL | Status: DC | PRN
  Filled 2021-06-15: qty 1

## 2021-06-15 MED ORDER — FUROSEMIDE 10 MG/ML IJ SOLN: 20.0000 mg | Freq: Two times a day (BID) | INTRAMUSCULAR | Status: DC

## 2021-06-15 MED ORDER — HALOPERIDOL 2 MG PO TABS
2.0000 mg | ORAL_TABLET | ORAL | Status: DC | PRN
  Filled 2021-06-15: qty 1

## 2021-06-15 MED ORDER — SODIUM CHLORIDE 0.9% FLUSH
10.0000 mL | INTRAVENOUS | Status: DC | PRN
  Administered 2021-06-23: 20 mL

## 2021-06-15 MED ORDER — HALOPERIDOL LACTATE 2 MG/ML PO CONC
2.0000 mg | ORAL | Status: DC | PRN
  Filled 2021-06-15: qty 1

## 2021-06-15 MED ORDER — SODIUM CHLORIDE 0.9 % IV SOLN
200.0000 mg | Freq: Once | INTRAVENOUS | Status: AC
  Administered 2021-06-15: 200 mg via INTRAVENOUS
  Filled 2021-06-15: qty 40

## 2021-06-15 MED ORDER — OLANZAPINE 10 MG PO TABS
10.0000 mg | ORAL_TABLET | Freq: Every day | ORAL | Status: DC
  Administered 2021-06-15 – 2021-06-23 (×9): 10 mg via ORAL
  Filled 2021-06-15 (×10): qty 1

## 2021-06-15 MED ORDER — MORPHINE SULFATE (PF) 2 MG/ML IV SOLN
1.0000 mg | INTRAVENOUS | Status: DC | PRN
  Administered 2021-06-15 – 2021-06-18 (×4): 1 mg via INTRAVENOUS
  Filled 2021-06-15 (×4): qty 1

## 2021-06-15 MED ORDER — ASPIRIN 300 MG RE SUPP
300.0000 mg | RECTAL | Status: AC
  Administered 2021-06-15: 300 mg via RECTAL
  Filled 2021-06-15: qty 1

## 2021-06-15 MED ORDER — DIPHENHYDRAMINE HCL 50 MG/ML IJ SOLN: 12.5000 mg | INTRAMUSCULAR | Status: DC | PRN

## 2021-06-15 MED ORDER — LORAZEPAM 2 MG/ML PO CONC
1.0000 mg | ORAL | Status: DC | PRN
  Administered 2021-06-19: 1 mg via SUBLINGUAL
  Filled 2021-06-15: qty 1
  Filled 2021-06-15: qty 0.5

## 2021-06-15 MED ORDER — ACETAMINOPHEN 650 MG RE SUPP: 650.0000 mg | Freq: Four times a day (QID) | RECTAL | Status: DC | PRN

## 2021-06-15 MED ORDER — VANCOMYCIN HCL 500 MG/100ML IV SOLN
500.0000 mg | Freq: Once | INTRAVENOUS | Status: AC
  Administered 2021-06-15: 500 mg via INTRAVENOUS
  Filled 2021-06-15: qty 100

## 2021-06-15 MED ORDER — ALBUTEROL SULFATE (2.5 MG/3ML) 0.083% IN NEBU: 2.5000 mg | INHALATION_SOLUTION | RESPIRATORY_TRACT | Status: DC | PRN

## 2021-06-15 MED ORDER — LORAZEPAM 2 MG/ML IJ SOLN
1.0000 mg | INTRAMUSCULAR | Status: DC | PRN
  Administered 2021-06-23 – 2021-06-24 (×2): 1 mg via INTRAVENOUS
  Filled 2021-06-15: qty 1

## 2021-06-15 MED ORDER — ONDANSETRON HCL 4 MG/2ML IJ SOLN: 4.0000 mg | Freq: Four times a day (QID) | INTRAMUSCULAR | Status: DC | PRN

## 2021-06-15 MED ORDER — ASPIRIN 81 MG PO CHEW: 324.0000 mg | CHEWABLE_TABLET | ORAL | Status: AC

## 2021-06-15 MED ORDER — ACETAMINOPHEN 325 MG PO TABS: 650.0000 mg | ORAL_TABLET | ORAL | Status: DC | PRN

## 2021-06-15 MED ORDER — ACETAMINOPHEN 325 MG PO TABS
650.0000 mg | ORAL_TABLET | Freq: Four times a day (QID) | ORAL | Status: DC | PRN
  Administered 2021-06-21: 15:00:00 650 mg via ORAL
  Filled 2021-06-15: qty 2

## 2021-06-15 MED ORDER — SODIUM CHLORIDE 0.9% FLUSH
10.0000 mL | Freq: Two times a day (BID) | INTRAVENOUS | Status: DC
  Administered 2021-06-15 – 2021-06-24 (×14): 10 mL

## 2021-06-15 MED ORDER — HEPARIN (PORCINE) 25000 UT/250ML-% IV SOLN
700.0000 [IU]/h | INTRAVENOUS | Status: DC
Start: 2021-06-15 — End: 2021-06-15
  Administered 2021-06-15: 700 [IU]/h via INTRAVENOUS
  Filled 2021-06-15: qty 250

## 2021-06-15 MED ORDER — ACETAMINOPHEN 500 MG PO TABS: 1000.0000 mg | ORAL_TABLET | ORAL | Status: DC | PRN

## 2021-06-15 MED ORDER — GLYCOPYRROLATE 0.2 MG/ML IJ SOLN
0.2000 mg | INTRAMUSCULAR | Status: DC | PRN
  Filled 2021-06-15: qty 1

## 2021-06-15 MED ORDER — SODIUM CHLORIDE 0.9 % IV SOLN
2.0000 g | Freq: Three times a day (TID) | INTRAVENOUS | Status: DC
  Administered 2021-06-15: 2 g via INTRAVENOUS
  Filled 2021-06-15: qty 2

## 2021-06-15 MED ORDER — SODIUM CHLORIDE 0.9 % IV SOLN: 100.0000 mg | Freq: Every day | INTRAVENOUS | Status: DC

## 2021-06-15 MED ORDER — OXYCODONE HCL 5 MG PO TABS
5.0000 mg | ORAL_TABLET | ORAL | Status: DC | PRN
  Administered 2021-06-15: 5 mg via ORAL
  Filled 2021-06-15: qty 1

## 2021-06-15 MED ORDER — OXYCODONE HCL 5 MG PO TABS
20.0000 mg | ORAL_TABLET | ORAL | Status: DC | PRN
  Administered 2021-06-15 – 2021-06-23 (×29): 20 mg via ORAL
  Filled 2021-06-15 (×29): qty 4

## 2021-06-15 MED ORDER — LORAZEPAM 1 MG PO TABS
1.0000 mg | ORAL_TABLET | ORAL | Status: DC | PRN
  Administered 2021-06-16 – 2021-06-24 (×9): 1 mg via ORAL
  Filled 2021-06-15 (×9): qty 1

## 2021-06-15 MED ORDER — SODIUM CHLORIDE 0.9 % IV SOLN: 2.0000 g | Freq: Two times a day (BID) | INTRAVENOUS | Status: DC

## 2021-06-15 MED ORDER — FLUOXETINE HCL 20 MG PO CAPS
40.0000 mg | ORAL_CAPSULE | Freq: Every day | ORAL | Status: DC
  Administered 2021-06-16 – 2021-06-24 (×9): 40 mg via ORAL
  Filled 2021-06-15 (×10): qty 2

## 2021-06-15 MED ORDER — GLYCOPYRROLATE 1 MG PO TABS
1.0000 mg | ORAL_TABLET | ORAL | Status: DC | PRN
  Filled 2021-06-15: qty 1

## 2021-06-15 MED ORDER — HALOPERIDOL LACTATE 5 MG/ML IJ SOLN
2.0000 mg | INTRAMUSCULAR | Status: DC | PRN
  Administered 2021-06-23: 2 mg via INTRAVENOUS
  Filled 2021-06-15: qty 1

## 2021-06-15 MED ORDER — ASPIRIN EC 81 MG PO TBEC: 81.0000 mg | DELAYED_RELEASE_TABLET | Freq: Every day | ORAL | Status: DC

## 2021-06-15 MED ORDER — MORPHINE SULFATE (CONCENTRATE) 10 MG/0.5ML PO SOLN: 5.0000 mg | ORAL | Status: DC | PRN

## 2021-06-15 MED ORDER — LORAZEPAM 2 MG/ML IJ SOLN
1.0000 mg | INTRAMUSCULAR | Status: DC | PRN
  Filled 2021-06-15: qty 1

## 2021-06-15 MED ORDER — MAGIC MOUTHWASH: 15.0000 mL | Freq: Four times a day (QID) | ORAL | Status: DC | PRN

## 2021-06-15 MED ORDER — FUROSEMIDE 10 MG/ML IJ SOLN
60.0000 mg | Freq: Two times a day (BID) | INTRAMUSCULAR | Status: DC
  Administered 2021-06-15: 60 mg via INTRAVENOUS
  Filled 2021-06-15: qty 8

## 2021-06-15 MED ORDER — MORPHINE SULFATE (CONCENTRATE) 10 MG/0.5ML PO SOLN
5.0000 mg | ORAL | Status: DC | PRN
  Administered 2021-06-15: 5 mg via SUBLINGUAL
  Filled 2021-06-15: qty 0.5

## 2021-06-15 MED ORDER — NITROGLYCERIN 0.4 MG SL SUBL: 0.4000 mg | SUBLINGUAL_TABLET | SUBLINGUAL | Status: DC | PRN

## 2021-06-15 NOTE — ED Notes (Signed)
Discussing comfort care with POA. MD at bedside.

## 2021-06-15 NOTE — Assessment & Plan Note (Addendum)
On Eliquis.  For PE we will continue.

## 2021-06-15 NOTE — Assessment & Plan Note (Signed)
Presents with confusion and unresponsiveness. Likely secondary to respiratory acidosis and CO2 narcosis. Also potentially opioid induced confusion as well. Currently comfort care.  Mentation improved.

## 2021-06-15 NOTE — Assessment & Plan Note (Signed)
Extensive discussion with patient's POA at bedside as well as patient. On my initial evaluation patient was not alert awake oriented or nonverbal and unable to follow any commands. Patient's blood pressure was in 80s. Requested POA to acknowledge patient's wish to go on hospice and focus on comfort which she agreed to.  Later on the patient is alert awake and oriented able to follow commands.  At that time patient wants to continue to focus on comfort care. Patient would like to consider residential hospice placement. Will consult TOC.

## 2021-06-15 NOTE — Assessment & Plan Note (Signed)
Recent hospitalization between 10/18-11/24 for Keytruda induced pneumonitis treated with steroids. Discharged home on 7 L of oxygen. Presents again with complaints of respiratory distress.  Was 70% of the time of EMS arrival.  CPAP was placed.  On arrival transition to BiPAP. Patient's POA wanted to maintain aggressive therapy with the limitation of DNR. VBG shows PCO2 of 100 pH of 7.1. Currently on nasal cannula at 7 LPM.  Still in respiratory distress. COVID-19 positive as well as procalcitonin significantly elevated concerning for bacterial pneumonia as well. In the setting of advanced lung disease patient's prognosis is severely guarded. Patient would like to continue comfort care. Hospice should be following.

## 2021-06-15 NOTE — Hospital Course (Signed)
11/25, admitted with respiratory distress.  POA wanted aggressive therapy and treatment.  Started on IV antibiotics, BiPAP and IV heparin. 11/26 multiple discussion with POA and patient at bedside.  Patient wants to continue to focus on comfort only and wants to consider residential hospice.

## 2021-06-15 NOTE — Assessment & Plan Note (Signed)
Recently admitted for [facility] induced pneumonitis. Not a candidate for further chemotherapy or immunotherapy. Prognosis is guarded in the setting of severe COPD. Now has dual pneumonia and therefore prognosis remains poor.

## 2021-06-15 NOTE — ED Notes (Addendum)
Pt soiled linen with feces and urine.This RN and Gabby NT assist pt with a full linen change at this time. Pt's primofit replaced.

## 2021-06-15 NOTE — ED Notes (Signed)
RT paged to wean pt off HFNC per MD order

## 2021-06-15 NOTE — ED Notes (Signed)
This tech and Ashley,RN provided pt w inc/ care and linen change. New male primofit and dry brief applied. Mouth care provided by RN. No other needs found at this moment. Family member at bedside.

## 2021-06-15 NOTE — Progress Notes (Signed)
Pharmacy Antibiotic Note  Jimmy West is a 70 y.o. male admitted on 05/26/2021 with sepsis.  Pharmacy has been consulted for Vancomycin, Cefepime  dosing.  Plan: Cefepime 2 gm IV X 1 given on 11/25 @ 2148 Cefepime 2 gm IV Q8H ordered to continue on 11/26 @ 0600.   Vancomycin 1 gm IV X 1 given in ED on 11/25 @ 2222. Vancomycin 500 mg IV X 1 to make total loading dose of 1500 mg.  Vancomycin 1250 mg IV Q24H ordered to start on 11/26 @ 2200.   AUC = 538.2 Vanc trough = 11.9   Height: 5\' 6"  (167.6 cm) Weight: 60.1 kg (132 lb 7.9 oz) IBW/kg (Calculated) : 63.8  Temp (24hrs), Avg:99.2 F (37.3 C), Min:99.2 F (37.3 C), Max:99.2 F (37.3 C)  Recent Labs  Lab 05/30/2021 2122  WBC 23.1*  CREATININE 1.03  LATICACIDVEN 1.7    Estimated Creatinine Clearance: 55.9 mL/min (by C-G formula based on SCr of 1.03 mg/dL).    No Known Allergies  Antimicrobials this admission:   >>    >>   Dose adjustments this admission:   Microbiology results:  BCx:   UCx:    Sputum:    MRSA PCR:   Thank you for allowing pharmacy to be a part of this patient's care.  Betania Dizon D 06/15/2021 3:26 AM

## 2021-06-15 NOTE — Assessment & Plan Note (Signed)
Patient is on oxycodone 20 mg every 4 hours at home.  We will continue.

## 2021-06-15 NOTE — Assessment & Plan Note (Signed)
Procalcitonin was 1 at the time of admission and jumped to 22. Concerning for severe worsening of her bacterial illness despite aggressive IV antibiotics. Patient currently comfort care.

## 2021-06-15 NOTE — Assessment & Plan Note (Signed)
COVID-19 virus test positive. Chest x-ray shows worsening pneumonia. Patient was started on remdesivir. Currently comfort care. Prognosis guarded.

## 2021-06-15 NOTE — ED Notes (Signed)
Mouth wetted with glycerin swabs, pt requesting pain medication at this time.

## 2021-06-15 NOTE — Progress Notes (Addendum)
[facility] Progress Note  Patient: Jimmy West    WUG:[id number]  DOA: 05/26/2021    Date of Service: the patient was seen and examined on 06/15/2021  Brief hospital course: 11/25, admitted with respiratory distress.  POA wanted aggressive therapy and treatment.  Started on IV antibiotics, BiPAP and IV heparin. 11/26 multiple discussion with POA and patient at bedside.  Patient wants to continue to focus on comfort only and wants to consider residential hospice.  Assessment and Plan: * Acute on chronic respiratory failure with hypoxia and hypercapnia (HCC) Recent hospitalization between 10/18-11/24 for Keytruda induced pneumonitis treated with steroids. Discharged home on 7 L of oxygen. Presents again with complaints of respiratory distress.  Was 70% of the time of EMS arrival.  CPAP was placed.  On arrival transition to BiPAP. Patient's POA wanted to maintain aggressive therapy with the limitation of DNR. VBG shows PCO2 of 100 pH of 7.1. Currently on nasal cannula at 7 LPM.  Still in respiratory distress. COVID-19 positive as well as procalcitonin significantly elevated concerning for bacterial pneumonia as well. In the setting of advanced lung disease patient's prognosis is severely guarded. Patient would like to continue comfort care. Hospice should be following.   Severe sepsis ([place]) Present on admission with SIRS criteria met with endorgan damage with encephalopathy and elevated troponin. Secondary to COVID-19 infection as well as bacterial pneumonia.  Remains hypotensive.  At risk for volume overload if he receives IV fluids. Currently comfort care.  Acute respiratory disease due to COVID-19 virus COVID-19 virus test positive. Chest x-ray shows worsening pneumonia. Patient was started on remdesivir. Currently comfort care. Prognosis guarded.  HCAP (healthcare-associated pneumonia) Procalcitonin was 1 at the time of admission and jumped to 22. Concerning for  severe worsening of her bacterial illness despite aggressive IV antibiotics. Patient currently comfort care.  Palliative care encounter Extensive discussion with patient's POA at bedside as well as patient. On my initial evaluation patient was not alert awake oriented or nonverbal and unable to follow any commands. Patient's blood pressure was in 80s. Requested POA to acknowledge patient's wish to go on hospice and focus on comfort which she agreed to.  Later on the patient is alert awake and oriented able to follow commands.  At that time patient wants to continue to focus on comfort care. Patient would like to consider residential hospice placement. Will consult TOC.   Acute metabolic encephalopathy Presents with confusion and unresponsiveness. Likely secondary to respiratory acidosis and CO2 narcosis. Also potentially opioid induced confusion as well. Currently comfort care.  Mentation improved.  Primary cancer of right lower lobe of lung [facility]) Recently admitted for [facility] induced pneumonitis. Not a candidate for further chemotherapy or immunotherapy. Prognosis is guarded in the setting of severe COPD. Now has dual pneumonia and therefore prognosis remains poor.  NSTEMI (non-ST elevated myocardial infarction) (HCC) Troponins mildly elevated but likely demand ischemia.  No complaints of of chest pain.  No evidence of acute ischemia on the EKG.  Cancer-related pain Patient is on oxycodone 20 mg every 4 hours at home.  We will continue.  Depression Continue Prozac.  Severe protein-calorie malnutrition ([place]) Placing the patient at high risk of poor outcome.   Chronic anticoagulation On Eliquis.  For PE we will continue.   Body mass index is 21.39 kg/m.        Subjective: Patient was nonverbal unable to follow any commands already at the time of my evaluation.  Later in the day patient becomes more alert  and awake.  Able to follow commands and had a conversation.  Wants  to continue full code.  Has some back pain.  No nausea or vomiting.  No chest pain.  Continues to have shortness of breath.  Objective: Hypotensive with blood pressure in 80s.  Tachycardic and tachypneic.  Respiratory in 30s.  Saturation 100%  Exam: General: Appear in mild distress, no Rash; Oral Mucosa Clear, moist. no Abnormal Neck Mass Or lumps, Conjunctiva normal  Cardiovascular: S1 and S2 Present, no Murmur, Respiratory: increased respiratory effort, Bilateral Air entry present and bilateral  Crackles, no wheezes Abdomen: Bowel Sound present, Soft and no tenderness Extremities: trace Pedal edema Neurology: alert and oriented to time, place, and person affect appropriate. no new focal deficit Gait not checked due to patient safety concerns    Data Reviewed: Elevated troponin, elevated procalcitonin level, COVID positive, chest x-ray shows severe pneumonia.  Disposition:  Status is: Inpatient  Remains inpatient appropriate because: Unsafe discharge.  Monitor overnight for stability.  Refer TOC for residential hospice placement.  Family Communication: POA at bedside.  DVT Prophylaxis:   Time spent: The patient is critically ill with multiple organ systems failure and requires high complexity decision making for assessment and support, frequent evaluation and titration of therapies. Critical Care Time devoted to patient care services described in this note is 35 minutes   Author: Berle Mull  06/15/2021 7:38 PM  To reach On-call, see care teams to locate the attending and reach out via [email or web address]. Between 7PM-7AM, please contact night-coverage If you still have difficulty reaching the attending provider, please page the [facility] (Director on Call) for [facility] on amion for assistance.

## 2021-06-15 NOTE — Progress Notes (Signed)
Remdesivir - Pharmacy Brief Note   O:  ALT: 25 CXR:  SpO2: 100 % on BiPap   A/P:  Remdesivir 200 mg IVPB once followed by 100 mg IVPB daily x 4 days.   Jimmy West D 06/15/2021 2:57 AM

## 2021-06-15 NOTE — Assessment & Plan Note (Signed)
Troponins mildly elevated but likely demand ischemia.  No complaints of of chest pain.  No evidence of acute ischemia on the EKG.

## 2021-06-15 NOTE — Assessment & Plan Note (Signed)
Continue Prozac

## 2021-06-15 NOTE — Progress Notes (Signed)
ANTICOAGULATION CONSULT NOTE - Initial Consult  Pharmacy Consult for Heparin  Indication: chest pain/ACS  No Known Allergies  Patient Measurements: Height: 5\' 6"  (167.6 cm) Weight: 60.1 kg (132 lb 7.9 oz) IBW/kg (Calculated) : 63.8 Heparin Dosing Weight: 60.1 kg   Vital Signs: Temp: 99.2 F (37.3 C) (11/25 2227) Temp Source: Rectal (11/25 2227) BP: 98/68 (11/25 2200) Pulse Rate: 93 (11/25 2227)  Labs: Recent Labs    05/28/2021 2122  HGB 13.1  HCT 43.6  PLT 309  CREATININE 1.03  TROPONINIHS 340*    Estimated Creatinine Clearance: 55.9 mL/min (by C-G formula based on SCr of 1.03 mg/dL).   Medical History: Past Medical History:  Diagnosis Date   Bulging lumbar disc    Cancer ([place])    stage 4 lung cancer   Heart attack ([place])     Medications:  (Not in a hospital admission)   Assessment: Pharmacy consulted to dose heparin in this 70 year old male admitted with ACS/NSTEMI.  Pt was on Eliquis 5 mg PO BID PTA, unsure of last dose. CrCl = 55.9 ml/min   Goal of Therapy:  Heparin level 0.3-0.7 units/ml Monitor platelets by anticoagulation protocol: Yes   Plan:  Will order baseline HL, aPTT and INR.  Will not bolus this pt due to recent Eliquis use. Will start heparin at 700 units/hr . Wil use aPTT to dose heparin until HL and aPTT correlate. Will draw first aPTT 8 hrs after start of drip. Will check HL on 11/27 with AM labs.   Yaffa Seckman D 06/15/2021,2:31 AM

## 2021-06-15 NOTE — Assessment & Plan Note (Signed)
Placing the patient at high risk of poor outcome.

## 2021-06-15 NOTE — ED Notes (Signed)
Phlebotomist at bedside.

## 2021-06-15 NOTE — TOC Initial Note (Signed)
Transition of Care [facility]) - Initial/Assessment Note    Patient Details  Name: Jimmy West MRN: [id number] Date of Birth: [birth year]  Transition [facility] CM/SW Contact:    Shelbie Hutching, RN Phone Number: 06/15/2021, 11:25 AM  Clinical Narrative:                 Patient being admitted to the hospital with respiratory distress, stage 4 lung cancer and tested positive for COVID.  Patient was just discharged on 11/24 to home with [facility].  Mona set patient up with home oxygen which he was using at 7 L.  TOC will follow through hospitalization, the patient lives alone, he has a friend that checks in on him.   Disposition to be determined.   Expected Discharge Plan: Home w Hospice Care Barriers to Discharge: Continued Medical Work up   Patient Goals and CMS Choice        Expected Discharge Plan and Services Expected Discharge Plan: [place]   Discharge Planning Services: CM Consult Post Acute Care Choice: Hospice Living arrangements for the past 2 months: Single Family Home                                      Prior Living Arrangements/Services Living arrangements for the past 2 months: Single Family Home Lives with:: Self Patient language and need for interpreter reviewed:: Yes        Need for Family Participation in Patient Care: Yes (Comment) (cancer- COVID) Care giver support system in place?: Yes (comment) (friend) Current home services: DME (oxygen set up through [facility]) Criminal Activity/Legal Involvement Pertinent to Current Situation/Hospitalization: No - Comment as needed  Activities of Daily Living      Permission Sought/Granted                  Emotional Assessment         Alcohol / Substance Use: Not Applicable Psych Involvement: No (comment)  Admission diagnosis:  NSTEMI (non-ST elevated myocardial infarction) ([place]) [I21.4] Patient Active Problem List   Diagnosis Date Noted    Pneumonitis 06/13/2021   Severe protein-calorie malnutrition ([place]) 05/11/2021   Non-ST elevation (NSTEMI) myocardial infarction ([place]) 05/11/2021   SOB (shortness of breath)    Acute respiratory failure with hypoxia and hypercapnia ([place]) 05/07/2021   HCAP (healthcare-associated pneumonia) 04/25/2021   Acute anemia 04/24/2021   Acute hypokalemia 04/24/2021   NSTEMI (non-ST elevated myocardial infarction) ([place]) 04/24/2021   Diarrhea 04/24/2021   Hypercalcemia 11/26/2020   Nonintractable headache 07/01/2020   Chronic anticoagulation 05/02/2020   Pleuritic pain 03/26/2020   Chemotherapy-induced neutropenia ([place]) 01/06/2020   Depression 12/21/2019   Palliative care encounter 08/26/2019   Anemia due to antineoplastic chemotherapy 06/27/2019   C. difficile diarrhea 06/26/2019   Pulmonary embolus ([place]) 06/20/2019   Anxiety in cancer patient 03/29/2019   Renal insufficiency 03/29/2019   Other fatigue 03/04/2019   Vomiting 03/04/2019   Chemotherapy induced nausea and vomiting 02/12/2019   Abdominal pain 02/11/2019   Mild protein-calorie malnutrition ([place]) 10/10/2018   Weight loss 09/20/2018   Elevated bilirubin 07/14/2018   Encounter for antineoplastic chemotherapy 05/24/2018   Encounter for antineoplastic immunotherapy 05/24/2018   Goals of care, counseling/discussion 05/17/2018   Cancer-related pain 05/01/2018   Bone metastasis ([place]) 04/30/2018   Primary cancer of right lower lobe of lung ([place]) 04/26/2018   Chronic midline low back  pain without sciatica 04/26/2018   PCP:  Merryl Hacker, No Pharmacy:   [place], [place] [place] [address] Phone: [phone] Fax: [place], [address] [place] [address] Phone: [phone] Fax: [phone]     Social Determinants of Health (SDOH) Interventions    Readmission Risk Interventions Readmission Risk Prevention  Plan 06/15/2021 04/26/2021  Transportation Screening Complete Complete  PCP or Specialist Appt within 3-5 Days - Complete  HRI or [place] - Complete  Social Work Consult for [place] Planning/Counseling - Complete  Palliative Care Screening - Not Applicable  Medication Review Press photographer) Referral to Pharmacy Complete  PCP or Specialist appointment within 3-5 days of discharge Complete -  [place] or Home Care Consult Complete -  SW Recovery Care/Counseling Consult Complete -  Palliative Care Screening Complete -  [place] Not Applicable -  Some recent data might be hidden

## 2021-06-15 NOTE — Progress Notes (Signed)
PHARMACY NOTE:  ANTIMICROBIAL RENAL DOSAGE ADJUSTMENT  Current antimicrobial regimen includes a mismatch between antimicrobial dosage and estimated renal function.  As per policy approved by the Pharmacy & Therapeutics and Medical Executive Committees, the antimicrobial dosage will be adjusted accordingly.  Current antimicrobial dosage:  Cefepime 2 g IV q8h  Indication: Pneumonia  Renal Function:  Estimated Creatinine Clearance: 55.9 mL/min (by C-G formula based on SCr of 1.03 mg/dL).    Antimicrobial dosage has been changed to:  Cefepime 2 g IV q12h   Thank you for allowing pharmacy to be a part of this patient's care.  Benita Gutter, [facility] 06/15/2021 9:10 AM

## 2021-06-15 NOTE — ED Notes (Signed)
Pt repositioned to right side, new brief and chux applied. Non blanching redness noted on left buttock. Skin irritation associated with oxygen tubing noted on cheeks- tubing wrapped with gauze.

## 2021-06-15 NOTE — Assessment & Plan Note (Signed)
Present on admission with SIRS criteria met with endorgan damage with encephalopathy and elevated troponin. Secondary to COVID-19 infection as well as bacterial pneumonia.  Remains hypotensive.  At risk for volume overload if he receives IV fluids. Currently comfort care.

## 2021-06-15 NOTE — ED Notes (Signed)
Pt given sips of water and TV remote

## 2021-06-16 DIAGNOSIS — E43 Unspecified severe protein-calorie malnutrition: Secondary | ICD-10-CM

## 2021-06-16 DIAGNOSIS — G9341 Metabolic encephalopathy: Secondary | ICD-10-CM

## 2021-06-16 DIAGNOSIS — A419 Sepsis, unspecified organism: Secondary | ICD-10-CM

## 2021-06-16 DIAGNOSIS — U071 COVID-19: Secondary | ICD-10-CM | POA: Diagnosis not present

## 2021-06-16 DIAGNOSIS — R6520 Severe sepsis without septic shock: Secondary | ICD-10-CM

## 2021-06-16 DIAGNOSIS — J9621 Acute and chronic respiratory failure with hypoxia: Secondary | ICD-10-CM | POA: Diagnosis not present

## 2021-06-16 DIAGNOSIS — J1282 Pneumonia due to coronavirus disease 2019: Secondary | ICD-10-CM

## 2021-06-16 DIAGNOSIS — C3490 Malignant neoplasm of unspecified part of unspecified bronchus or lung: Secondary | ICD-10-CM | POA: Insufficient documentation

## 2021-06-16 DIAGNOSIS — I214 Non-ST elevation (NSTEMI) myocardial infarction: Secondary | ICD-10-CM

## 2021-06-16 MED ORDER — SODIUM CHLORIDE 0.9 % IV SOLN
2.0000 g | INTRAVENOUS | Status: AC
  Administered 2021-06-16 – 2021-06-20 (×5): 2 g via INTRAVENOUS
  Filled 2021-06-16 (×4): qty 20
  Filled 2021-06-16: qty 2

## 2021-06-16 MED ORDER — DOXYCYCLINE HYCLATE 100 MG PO TABS
100.0000 mg | ORAL_TABLET | Freq: Two times a day (BID) | ORAL | Status: AC
  Administered 2021-06-16 – 2021-06-20 (×10): 100 mg via ORAL
  Filled 2021-06-16 (×10): qty 1

## 2021-06-16 MED ORDER — APIXABAN 5 MG PO TABS
5.0000 mg | ORAL_TABLET | Freq: Two times a day (BID) | ORAL | Status: DC
  Administered 2021-06-16 – 2021-06-24 (×16): 5 mg via ORAL
  Filled 2021-06-16 (×16): qty 1

## 2021-06-16 MED ORDER — ENSURE ENLIVE PO LIQD
237.0000 mL | Freq: Two times a day (BID) | ORAL | Status: DC
  Administered 2021-06-16 – 2021-06-24 (×14): 237 mL via ORAL

## 2021-06-16 MED ORDER — SODIUM CHLORIDE 0.9 % IV SOLN
100.0000 mg | Freq: Every day | INTRAVENOUS | Status: AC
  Administered 2021-06-16 – 2021-06-19 (×4): 100 mg via INTRAVENOUS
  Filled 2021-06-16 (×2): qty 20
  Filled 2021-06-16 (×2): qty 100

## 2021-06-16 NOTE — Plan of Care (Signed)
Assisted to call for meals.

## 2021-06-16 NOTE — ED Notes (Signed)
Meal tray provided.

## 2021-06-16 NOTE — Progress Notes (Signed)
New admission into room 125. On 10L HFNC. With dyspnea on exertion.Call bell within reach   06/16/21 1537  Vitals  Temp 97.7 F (36.5 C)  Temp Source Oral  BP 108/73  MAP (mmHg) 84  BP Location Left Arm  BP Method Automatic  Patient Position (if appropriate) Lying  Pulse Rate 98  Pulse Rate Source Monitor  ECG Heart Rate 100  MEWS COLOR  MEWS Score Color Comfort Care Only  Oxygen Therapy  SpO2 97 %  O2 Device HFNC  O2 Flow Rate (L/min) 10 L/min  MEWS Score  MEWS Temp 0  MEWS Systolic 0  MEWS Pulse 0  MEWS RR 0  MEWS LOC 0  MEWS Score 0

## 2021-06-16 NOTE — Progress Notes (Addendum)
Patient ID: Jimmy West, male   DOB: [birth year], 70 y.o.   MRN: [id number] [facility] PROGRESS NOTE  Seraj Dunnam JQB:[id number] DOB: [birth year] DOA: 06/18/2021 PCP: Pcp, No  HPI/Subjective: Patient mentating and feeling okay.  Patient on 10 L high flow nasal cannula.  Family in the room and concerned that he was comfort care measures.  The patient was able to make his own decision and decided to go with treatment for pneumonia and COVID infection.  He still is interested in hospice.  Objective: Vitals:   06/16/21 1102 06/16/21 1130  BP:  103/71  Pulse: (!) 112 (!) 108  Resp:    Temp:    SpO2: 100% 100%    Intake/Output Summary (Last 24 hours) at 06/16/2021 1307 Last data filed at 06/15/2021 1844 Gross per 24 hour  Intake --  Output 700 ml  Net -700 ml   Filed Weights   05/24/2021 2023  Weight: 60.1 kg    ROS: Review of Systems  Respiratory:  Positive for cough and shortness of breath.   Cardiovascular:  Negative for chest pain.  Gastrointestinal:  Negative for abdominal pain, nausea and vomiting.  Exam: Physical Exam HENT:     Head: Normocephalic.     Mouth/Throat:     Pharynx: No oropharyngeal exudate.  Eyes:     General: Lids are normal.     Conjunctiva/sclera: Conjunctivae normal.  Cardiovascular:     Rate and Rhythm: Normal rate and regular rhythm.     Heart sounds: Normal heart sounds, S1 normal and S2 normal.  Pulmonary:     Breath sounds: Examination of the right-lower field reveals decreased breath sounds. Examination of the left-lower field reveals decreased breath sounds. Decreased breath sounds present. No wheezing, rhonchi or rales.  Abdominal:     Palpations: Abdomen is soft.     Tenderness: There is no abdominal tenderness.  Musculoskeletal:     Right lower leg: No swelling.     Left lower leg: No swelling.  Skin:    General: Skin is warm.     Findings: No rash.  Neurological:     Mental Status: He is alert and oriented  to person, place, and time.      Scheduled Meds:  Chlorhexidine Gluconate Cloth  6 each Topical Daily   doxycycline  100 mg Oral Q12H   feeding supplement  237 mL Oral BID BM   FLUoxetine  40 mg Oral Daily   methylPREDNISolone (SOLU-MEDROL) injection  40 mg Intravenous Q12H   OLANZapine  10 mg Oral QHS   sodium chloride flush  10-40 mL Intracatheter Q12H   Continuous Infusions:  cefTRIAXone (ROCEPHIN)  IV     remdesivir 100 mg in NS 100 mL      Assessment/Plan:  Acute on chronic hypoxic hypercapnic respiratory failure.  Patient had a recent hospitalization for Keytruda induced pneumonitis treated with steroids.  He was discharged home on 7 L of oxygen.  Came back with hypoxia with pulse ox of 70% by EMS.  Patient was on BiPAP initially and now on high flow nasal cannula.  He was found to be COVID-positive. Severe sepsis, present on admission with acute metabolic encephalopathy and COVID-19 pneumonia with possible bacterial pneumonia.  Patient changed his comfort care measures order to treat pneumonia and COVID infection.  Continue Solu-Medrol.  Added remdesivir.  Added Rocephin and doxycycline.  Patient is still a DO NOT RESUSCITATE.  Patient still interested in hospice. Acute metabolic encephalopathy this has resolved.  Likely  from elevated CO2. Stage IV lung cancer.  Patient is a DO NOT RESUSCITATE. NSTEMI from demand ischemia from acute respiratory distress Chronic pain on oxycodone History of PE restart Eliquis. Severe protein calorie malnutrition.     Code Status:     Code Status Orders  (From admission, onward)           Start     Ordered   06/15/21 1239  Do not attempt resuscitation (DNR)  Continuous       Question Answer Comment  In the event of cardiac or respiratory ARREST Do not call a "code blue"   In the event of cardiac or respiratory ARREST Do not perform Intubation, CPR, defibrillation or ACLS   In the event of cardiac or respiratory ARREST Use  medication by any route, position, wound care, and other measures to relive pain and suffering. May use oxygen, suction and manual treatment of airway obstruction as needed for comfort.      06/15/21 1239           Code Status History     Date Active Date Inactive Code Status Order ID Comments User Context   06/15/2021 0251 06/15/2021 1239 DNR [id number]  Athena Masse, MD ED   05/25/2021 2026 06/15/2021 0251 DNR [id number]  Vanessa Clinch, MD ED   05/07/2021 1758 05/25/2021 0102 DNR [id number]  Enzo Bi, MD ED   05/07/2021 1720 05/07/2021 1758 DNR [id number]  Vladimir Crofts, MD ED   04/24/2021 1402 04/28/2021 2059 DNR [id number]  Cox, Gibbs, DO ED   04/24/2021 1235 04/24/2021 1401 Full Code [id number]  Cox, Amy N, DO ED   03/04/2019 1811 03/05/2019 2003 Full Code [id number]  Hillary Bow, MD Inpatient   02/11/2019 1647 02/13/2019 2012 Full Code [id number]  Lang Snow, NP Inpatient      Advance Directive Documentation    Flowsheet Row Most Recent Value  Type of Advance Directive Healthcare Power of Attorney  Pre-existing out of facility DNR order (yellow form or pink MOST form) --  "MOST" Form in Place? --      Family Communication: Family at bedside Disposition Plan: Status is: Inpatient  Antibiotics: Rocephin and doxycycline Remdesivir  Time spent: 34 minutes  Kiernan [employer]

## 2021-06-16 NOTE — Progress Notes (Signed)
Assisted with transport to 125 from the ED.  Pt was on 10 LPM High flow bubble cannula.  V60 also taken to the room for QHS use.  No issues with transport.

## 2021-06-17 DIAGNOSIS — I214 Non-ST elevation (NSTEMI) myocardial infarction: Secondary | ICD-10-CM

## 2021-06-17 DIAGNOSIS — A419 Sepsis, unspecified organism: Secondary | ICD-10-CM | POA: Diagnosis not present

## 2021-06-17 DIAGNOSIS — G9341 Metabolic encephalopathy: Secondary | ICD-10-CM

## 2021-06-17 DIAGNOSIS — C3490 Malignant neoplasm of unspecified part of unspecified bronchus or lung: Secondary | ICD-10-CM | POA: Diagnosis not present

## 2021-06-17 DIAGNOSIS — R6520 Severe sepsis without septic shock: Secondary | ICD-10-CM

## 2021-06-17 DIAGNOSIS — E43 Unspecified severe protein-calorie malnutrition: Secondary | ICD-10-CM

## 2021-06-17 DIAGNOSIS — U071 COVID-19: Secondary | ICD-10-CM | POA: Diagnosis not present

## 2021-06-17 DIAGNOSIS — J9621 Acute and chronic respiratory failure with hypoxia: Secondary | ICD-10-CM | POA: Diagnosis not present

## 2021-06-17 DIAGNOSIS — J1282 Pneumonia due to coronavirus disease 2019: Secondary | ICD-10-CM

## 2021-06-17 NOTE — Evaluation (Signed)
Physical Therapy Evaluation Patient Details Name: Jimmy West MRN: [id number] DOB: 07-29-1950 Today's Date: 06/17/2021  History of Present Illness  Pt is a 70 y.o. male presenting to hospital 11/25 with respiratory distress; sudden onset SOB with O2 levels in the 70's.  6-8 L home O2 use.  Pt admitted with acute on chronic respiratory failure with hypoxia and hypercapnia, severe sepsis, (+) COVID-19 virus, and HCAP.  PMH includes stage IV lung CA, bulging lumbar disc, NSTEMI, PNA, anemia, PE, bone metastasis, and L knee surgery.  Clinical Impression  Prior to hospital admission, pt was ambulatory with RW; on home O2; and lives alone in 1 level home with 4-5 STE B railings.  Pt resting in bed upon PT arrival and reporting having bowel movement a while ago but did not know what to do (pt reported he did not know that he should call nursing to assist with clean-up).  With pt's permission, therapist assisted pt with clean-up d/t bowel movement in bed; nursing notified of this and that pt would benefit from bath.  Currently pt is min to mod assist semi-supine to sitting edge of bed and min assist for transfer bed to recliner.  Pt requiring sitting rest breaks between activities d/t SOB (O2 sats 90% or greater on 10 L O2 via nasal cannula during sessions activities and O2 sats 95% at rest end of session).  Nurse notified pt requesting pain meds (for chronic back pain).  Discussed PT consult and pt's goals:  pt requesting PT services during hospital stay.  Pt would benefit from skilled PT to address noted impairments and functional limitations (see below for any additional details).  Upon hospital discharge, pt may benefit from SNF.    Recommendations for follow up therapy are one component of a multi-disciplinary discharge planning process, led by the attending physician.  Recommendations may be updated based on patient status, additional functional criteria and insurance authorization.  Follow Up  Recommendations Skilled nursing-short term rehab (<3 hours/day)    Assistance Recommended at Discharge Frequent or constant Supervision/Assistance  Functional Status Assessment Patient has had a recent decline in their functional status and demonstrates the ability to make significant improvements in function in a reasonable and predictable amount of time.  Equipment Recommendations  Rolling walker (2 wheels);BSC/3in1;Wheelchair (measurements PT);Wheelchair cushion (measurements PT)    Recommendations for Other Services OT consult     Precautions / Restrictions Precautions Precautions: Fall Precaution Comments: Monitor O2 sats; aspiration; R chest port Restrictions Weight Bearing Restrictions: No      Mobility  Bed Mobility Overal bed mobility: Needs Assistance Bed Mobility: Supine to Sit;Rolling Rolling: Supervision   Supine to sit: Min assist;Mod assist     General bed mobility comments: assist for trunk supine to sitting edge of bed    Transfers Overall transfer level: Needs assistance   Transfers: Sit to/from Stand;Bed to chair/wheelchair/BSC Sit to Stand: Min assist   Step pivot transfers: Min assist       General transfer comment: assist to steady    Ambulation/Gait Ambulation/Gait assistance:  (Deferred d/t pt's SOB)                Stairs            Wheelchair Mobility    Modified Rankin (Stroke Patients Only)       Balance Overall balance assessment: Needs assistance Sitting-balance support: No upper extremity supported;Feet supported Sitting balance-Leahy Scale: Good Sitting balance - Comments: steady sitting reaching within BOS   Standing balance  support: No upper extremity supported Standing balance-Leahy Scale: Poor Standing balance comment: min assist for balance static standing                             Pertinent Vitals/Pain Pain Assessment: Faces Faces Pain Scale: Hurts little more Pain Location: chronic  low back pain Pain Descriptors / Indicators: Discomfort;Aching;Sore Pain Intervention(s): Limited activity within patient's tolerance;Monitored during session;Repositioned;Patient requesting pain meds-RN notified HR WFL during sessions activities.    Home Living Family/patient expects to be discharged to:: Private residence Living Arrangements: Alone Available Help at Discharge: Family;Friend(s);Available PRN/intermittently Type of Home: Mobile home Home Access: Stairs to enter Entrance Stairs-Rails: Right;Left;Can reach both Entrance Stairs-Number of Steps: 4-5   Home Layout: One level Home Equipment: Conservation officer, nature (2 wheels)      Prior Function Prior Level of Function : Independent/Modified Independent             Mobility Comments: Ambulatory with RW; pt reports 5-6 L home O2 use       Hand Dominance        Extremity/Trunk Assessment   Upper Extremity Assessment Upper Extremity Assessment: Generalized weakness    Lower Extremity Assessment Lower Extremity Assessment: Generalized weakness    Cervical / Trunk Assessment Cervical / Trunk Assessment: Normal  Communication   Communication: No difficulties  Cognition Arousal/Alertness: Awake/alert Behavior During Therapy: WFL for tasks assessed/performed Overall Cognitive Status: Within Functional Limits for tasks assessed                                 General Comments: A&O x4        General Comments  Nursing cleared pt for participation in physical therapy.  Pt agreeable to PT session.     Exercises  Pursed lip breathing; transfers   Assessment/Plan    PT Assessment Patient needs continued PT services  PT Problem List Decreased strength;Decreased activity tolerance;Decreased balance;Decreased mobility;Decreased knowledge of use of DME;Decreased knowledge of precautions;Cardiopulmonary status limiting activity       PT Treatment Interventions DME instruction;Gait training;Stair  training;Functional mobility training;Therapeutic activities;Therapeutic exercise;Balance training;Patient/family education    PT Goals (Current goals can be found in the Care Plan section)  Acute Rehab PT Goals Patient Stated Goal: to improve mobility PT Goal Formulation: With patient Time For Goal Achievement: 07/01/21 Potential to Achieve Goals: Good    Frequency Min 2X/week   Barriers to discharge Decreased caregiver support      Co-evaluation               AM-PAC PT "6 Clicks" Mobility  Outcome Measure Help needed turning from your back to your side while in a flat bed without using bedrails?: None Help needed moving from lying on your back to sitting on the side of a flat bed without using bedrails?: A Little Help needed moving to and from a bed to a chair (including a wheelchair)?: A Little Help needed standing up from a chair using your arms (e.g., wheelchair or bedside chair)?: A Little Help needed to walk in hospital room?: A Lot Help needed climbing 3-5 steps with a railing? : A Lot 6 Click Score: 17    End of Session Equipment Utilized During Treatment: Gait belt;Oxygen (10 L O2 via nasal cannula) Activity Tolerance: Other (comment) (Limited d/t SOB with activity) Patient left: in chair;with call bell/phone within reach;with chair alarm set Nurse Communication:  Mobility status;Precautions;Patient requests pain meds PT Visit Diagnosis: Unsteadiness on feet (R26.81);Other abnormalities of gait and mobility (R26.89);Muscle weakness (generalized) (M62.81)    Time: 7366-8159 PT Time Calculation (min) (ACUTE ONLY): 50 min   Charges:   PT Evaluation $PT Eval Low Complexity: 1 Low PT Treatments $Therapeutic Activity: 23-37 mins       Leitha Bleak, PT 06/17/21, 5:46 PM

## 2021-06-17 NOTE — Progress Notes (Signed)
[place] [facility]) Hospitalized Hospice Patient Visit   Jimmy West is a current hospice patient with a terminal diagnosis of malignant neoplasm of lower lobe, right bronchus or lung. Patient was admitted to hospice services on Friday, 11.25.22. Later in the evening of 11.25.22 patient's O2 sats dropped into the 60-70s. Patient's friend [name] decided to call 911 and have patient transported to [facility] for evaluation. Patient was admitted with diagnosis of pneumonia and Covid. Per Dr. Gilford Rile with [facility], this is a related hospital admission.    Unable to go into patient's room due to Covid. Spoke with patient's nurse. Per nurse, patient is on 10L O2. Sats have improved with current O2 level. Patient is alert and oriented. Voices desire for treatment of pneumonia and Covid. Patient may work with PT later today to determine his ability to ambulate with current O2 requirements. Spoke with patient's friend [name] for update.    Patient remains inpatient appropriate in order to receive IV steroids, IV antibiotics and IV remdesivir to treat pneumonia and Covid infection.   V/S: 97.8 oral, 111/81, 103, 18, 100% on 10L via HFNC   I/O:  550/675   Abnormal Labs:  Chloride: 96 (L) CO2: 36 (H) Glucose: 138 (H) BUN: 27 (H) Albumin: 3.3 (L) B Natriuretic Peptide: 770.2 (H) Troponin I (High Sensitivity): 340 (HH) WBC: 23.1 (H) MCV: 101.6 (H) RDW: 15.9 (H) NEUT#: 19.7 (H) Monocyte #: 1.9 (H) Eosinophils Absolute: 0.6 (H) Abs Immature Granulocytes: 0.16 (H) Covid Positive  Diagnostics:  EXAM: PORTABLE CHEST 1 VIEW   IMPRESSION: Progression of bilateral mid to lower lung field airspace opacities compared to the prior radiograph.   IV/PRN: Remdesivir 200 mg IV x 1 dose, Remdesivir 100 mg IV daily, Vancomycin 1 gm IV x 1 dose, Vancomycin 500 mg IV daily, Maxipime 2 gm IV x 2 doses, Rocephin 2 gm IV daily, Solu-medrol 40 mg IV every 12 hours, Lasix 60 mg IV x 1  dose, Morphine 1 mg x 3 doses.   Problem List:  1. Acute on chronic hypoxic hypercapnic respiratory failure.  Patient had a recent hospitalization for [place] induced pneumonitis treated with steroids.  He was discharged home on 7 L of oxygen.  Came back with hypoxia with pulse ox of 70% by EMS.  Patient was on BiPAP initially and now on high flow nasal cannula.  He was found to be COVID-positive. 2. Severe sepsis, present on admission with acute metabolic encephalopathy and COVID-19 pneumonia with possible bacterial pneumonia.  Patient changed his comfort care measures order to treat pneumonia and COVID infection.  Continue Solu-Medrol.  Added remdesivir.  Added Rocephin and doxycycline.  Patient is still a DO NOT RESUSCITATE.  Patient still interested in hospice. 3. Acute metabolic encephalopathy this has resolved.  Likely from elevated CO2. 4. Stage IV lung cancer.  Patient is a DO NOT RESUSCITATE. 5. NSTEMI from demand ischemia from acute respiratory distress 6. Chronic pain on oxycodone 7. History of PE restart Eliquis. 8. Severe protein calorie malnutrition. Discharge Planning: Ongoing. Plan to discharge home with hospice services, if stable.    Family Contact: Spoke with friend [name] by phone.   IDT: Updated   Goals of Care: Clear, treat the treatable.    Please do not hesitate to call with any hospice related questions or concerns.    Thank you,    Bobbie "Loren Racer, [place], BSN [facility] Liaison [phone]

## 2021-06-17 NOTE — Progress Notes (Signed)
Patient ID: Jimmy West, male   DOB: October 18, 1950, 70 y.o.   MRN: [id number] [facility] PROGRESS NOTE  Jimmy West ACZ:[id number] DOB: 04-03-1951 DOA: 06/15/2021 PCP: Pcp, No  HPI/Subjective: Patient feeling little bit better.  No chest pain.  Slight shortness of breath.  Slight cough.  Currently on 10 L high flow nasal cannula.  Objective: Vitals:   06/17/21 0454 06/17/21 0819  BP: 107/81 111/81  Pulse: 98 (!) 103  Resp: 16 18  Temp: (!) 97.4 F (36.3 C) 97.8 F (36.6 C)  SpO2: 98% 100%    Intake/Output Summary (Last 24 hours) at 06/17/2021 1158 Last data filed at 06/17/2021 0250 Gross per 24 hour  Intake 550 ml  Output 675 ml  Net -125 ml   Filed Weights   06/07/2021 2023 06/17/21 0256  Weight: 60.1 kg 59.4 kg    ROS: Review of Systems  Respiratory:  Positive for cough and shortness of breath.   Cardiovascular:  Negative for chest pain.  Gastrointestinal:  Negative for abdominal pain, nausea and vomiting.  Exam: Physical Exam HENT:     Head: Normocephalic.     Mouth/Throat:     Pharynx: No oropharyngeal exudate.  Eyes:     General: Lids are normal.     Conjunctiva/sclera: Conjunctivae normal.  Cardiovascular:     Rate and Rhythm: Normal rate and regular rhythm.     Heart sounds: Normal heart sounds, S1 normal and S2 normal.  Pulmonary:     Breath sounds: Examination of the right-lower field reveals decreased breath sounds. Examination of the left-lower field reveals decreased breath sounds. Decreased breath sounds present. No wheezing, rhonchi or rales.  Abdominal:     Palpations: Abdomen is soft.     Tenderness: There is no abdominal tenderness.  Musculoskeletal:     Right lower leg: No swelling.     Left lower leg: No swelling.  Skin:    General: Skin is warm.     Findings: No rash.  Neurological:     Mental Status: He is alert and oriented to person, place, and time.      Scheduled Meds:  apixaban  5 mg Oral BID    Chlorhexidine Gluconate Cloth  6 each Topical Daily   doxycycline  100 mg Oral Q12H   feeding supplement  237 mL Oral BID BM   FLUoxetine  40 mg Oral Daily   methylPREDNISolone (SOLU-MEDROL) injection  40 mg Intravenous Q12H   OLANZapine  10 mg Oral QHS   sodium chloride flush  10-40 mL Intracatheter Q12H   Continuous Infusions:  cefTRIAXone (ROCEPHIN)  IV Stopped (06/16/21 1432)   remdesivir 100 mg in NS 100 mL 100 mg (06/17/21 0820)    Assessment/Plan:  Acute on chronic hypoxic hypercapnic respiratory failure secondary to COVID-19 pneumonia.  With EMS pulse ox was 70%.  Patient currently on 10 L high flow nasal cannula. Severe sepsis, present on admission with acute metabolic encephalopathy and COVID-19 pneumonia and possible bacterial pneumonia.  Patient wanted to undergo treatment with Solu-Medrol, remdesivir Rocephin and doxycycline.  Patient is still a DO NOT RESUSCITATE.  Patient still interested in hospice. Acute metabolic encephalopathy.  This has resolved.  Had elevated CO2 on presentation. Stage IV lung cancer.  Patient is a DO NOT RESUSCITATE Elevated troponins suspected NSTEMI but also could be elevated from demand ischemia with acute respiratory distress.  No further work-up with his stage IV lung cancer. Chronic pain on oxycodone History of PE on Eliquis Severe protein calorie  malnutrition      Code Status:     Code Status Orders  (From admission, onward)           Start     Ordered   06/15/21 1239  Do not attempt resuscitation (DNR)  Continuous       Question Answer Comment  In the event of cardiac or respiratory ARREST Do not call a "code blue"   In the event of cardiac or respiratory ARREST Do not perform Intubation, CPR, defibrillation or ACLS   In the event of cardiac or respiratory ARREST Use medication by any route, position, wound care, and other measures to relive pain and suffering. May use oxygen, suction and manual treatment of airway obstruction  as needed for comfort.      06/15/21 1239           Code Status History     Date Active Date Inactive Code Status Order ID Comments User Context   06/15/2021 0251 06/15/2021 1239 DNR [id number]  Athena Masse, MD ED   05/26/2021 2026 06/15/2021 0251 DNR [id number]  Vanessa Whittemore, MD ED   05/07/2021 1758 06/04/2021 0102 DNR [id number]  Enzo Bi, MD ED   05/07/2021 1720 05/07/2021 1758 DNR [id number]  Vladimir Crofts, MD ED   04/24/2021 1402 04/28/2021 2059 DNR [id number]  Cox, [place], DO ED   04/24/2021 1235 04/24/2021 1401 Full Code [id number]  Cox, Amy N, DO ED   03/04/2019 1811 03/05/2019 2003 Full Code [id number]  Hillary Bow, MD Inpatient   02/11/2019 1647 02/13/2019 2012 Full Code [id number]  Lang Snow, NP Inpatient      Advance Directive Documentation    Flowsheet Row Most Recent Value  Type of Advance Directive Healthcare Power of Attorney  Pre-existing out of facility DNR order (yellow form or pink MOST form) --  "MOST" Form in Place? --      Disposition Plan: Status is: Inpatient.  Patient will need 10 days of isolation in order to go out to hospice home versus rehab with palliative care.  Still on too much oxygen in order to make that decision.  Antibiotics: Rocephin doxycycline Remdesivir  Time spent: 27 minutes  [place]

## 2021-06-17 NOTE — Care Management Important Message (Signed)
Important Message  Patient Details  Name: Jimmy West MRN: [id number] Date of Birth: 1951/03/16   Medicare Important Message Given:  Other (see comment)  Patient is on comfort care measures and will return home with Hospice at discharge. Out of respect for the patient and family no Important Message from [facility] given.   [name] 06/17/2021, 9:13 AM

## 2021-06-17 NOTE — TOC Progression Note (Signed)
Transition of Care [facility]) - Progression Note    Patient Details  Name: Jimmy West MRN: [id number] Date of Birth: August 07, 1950  Transition of Care [facility]) CM/SW [place], RN Phone Number: 06/17/2021, 11:26 AM  Clinical Narrative:   Patient is active with [facility] at this time.  TOC to follow for needs.    Expected Discharge Plan: Home w Hospice Care Barriers to Discharge: Continued Medical Work up  Expected Discharge Plan and Services Expected Discharge Plan: [place]   Discharge Planning Services: CM Consult Post Acute Care Choice: Hospice Living arrangements for the past 2 months: Single Family Home                                       Social Determinants of Health (SDOH) Interventions    Readmission Risk Interventions Readmission Risk Prevention Plan 06/15/2021 04/26/2021  Transportation Screening Complete Complete  PCP or Specialist Appt within 3-5 Days - Complete  HRI or [place] - Complete  Social Work Consult for [place] Planning/Counseling - Complete  Palliative Care Screening - Not Applicable  Medication Review Press photographer) Referral to Pharmacy Complete  PCP or Specialist appointment within 3-5 days of discharge Complete -  [place] or Home Care Consult Complete -  SW Recovery Care/Counseling Consult Complete -  Palliative Care Screening Complete -  [place] Not Applicable -  Some recent data might be hidden

## 2021-06-17 NOTE — Progress Notes (Signed)
Initial Nutrition Assessment  DOCUMENTATION CODES:   Not applicable  INTERVENTION:  -Continue Ensure Enlive po BID, each supplement provides 350 kcal and 20 grams of protein  NUTRITION DIAGNOSIS:   Increased nutrient needs related to acute illness (COVID-19) as evidenced by estimated needs.  GOAL:   Patient will meet greater than or equal to 90% of their needs  MONITOR:   PO intake, Supplement acceptance, Weight trends, Labs, I & O's  REASON FOR ASSESSMENT:   Malnutrition Screening Tool    ASSESSMENT:   Pt admitted with severe sepsis, acute metabolic encephalopathy, and acute on chronic hypoxic hypercapnic respiratory failure 2/2 COVID-19 PNA with possible bacterial PNA. PMH significant for Stage IV lung cancer, chronic constipation, CAD s/p stent 2005, chronic respiratory failure on home O2 at 6-8 L, anxiety and depression, s/p prolonged hospitalization from 10/18-11/24 for [place] induced pneumonitis, NSTEMI managed medically and discharged to hospiceOf note, pt changed his comfort measures order to treat PNA and COVID infection, but remains DNR. Pt still interested in hospice per MD.  Per latest TOC CM/SW note, pt is on comfort measures and will return with Hospice at discharge.   RD working remotely from another campus. Unable to reach pt via phone to obtain diet/weight history. Will attempt to obtain at follow-up. Weight history reviewed; no significant weight changes noted. No PO intake documented, though pt accepting Ensure thus far per RN.   UOP: 659ml x24 hours I/O: -1630ml since admit  Medications: Scheduled Meds:  apixaban  5 mg Oral BID   Chlorhexidine Gluconate Cloth  6 each Topical Daily   doxycycline  100 mg Oral Q12H   feeding supplement  237 mL Oral BID BM   FLUoxetine  40 mg Oral Daily   methylPREDNISolone (SOLU-MEDROL) injection  40 mg Intravenous Q12H   OLANZapine  10 mg Oral QHS   sodium chloride flush  10-40 mL Intracatheter Q12H  Continuous  Infusions:  cefTRIAXone (ROCEPHIN)  IV Stopped (06/16/21 1432)   remdesivir 100 mg in NS 100 mL 100 mg (06/17/21 0820)    Labs: Recent Labs  Lab 06/06/2021 2122  NA 141  K 3.8  CL 96*  CO2 36*  BUN 27*  CREATININE 1.03  CALCIUM 9.5  GLUCOSE 138*   NUTRITION - FOCUSED PHYSICAL EXAM: Unable to perform at this time. Will attempt at follow-up.   Diet Order:   Diet Order             DIET DYS 3 Room service appropriate? Yes; Fluid consistency: Thin  Diet effective now                   EDUCATION NEEDS:   No education needs have been identified at this time  Skin:  Skin Assessment: Reviewed RN Assessment  Last BM:  11/27  Height:   Ht Readings from Last 1 Encounters:  06/07/2021 5\' 6"  (1.676 m)    Weight:   Wt Readings from Last 1 Encounters:  06/17/21 59.4 kg    BMI:  Body mass index is 21.14 kg/m.  Estimated Nutritional Needs:   Kcal:  1700-1900  Protein:  85-95 grams  Fluid:  >1.7L     Theone Stanley., MS, RD, LDN (she/her/hers) RD pager number and weekend/on-call pager number located in [place].

## 2021-06-17 NOTE — Telephone Encounter (Signed)
Patient has been re admitted.  Appt canceled. Patient is aware that appt has been canceled.  Will close encounter.

## 2021-06-17 NOTE — Telephone Encounter (Signed)
When I saw him last this was still up in the air.  Since he is now under hospice care he does not need follow-up.

## 2021-06-18 DIAGNOSIS — G9341 Metabolic encephalopathy: Secondary | ICD-10-CM | POA: Diagnosis not present

## 2021-06-18 DIAGNOSIS — U071 COVID-19: Secondary | ICD-10-CM | POA: Diagnosis not present

## 2021-06-18 DIAGNOSIS — J9621 Acute and chronic respiratory failure with hypoxia: Secondary | ICD-10-CM | POA: Diagnosis not present

## 2021-06-18 DIAGNOSIS — A419 Sepsis, unspecified organism: Secondary | ICD-10-CM | POA: Diagnosis not present

## 2021-06-18 NOTE — Progress Notes (Signed)
Patient ID: Jimmy West, male   DOB: 28-Jun-1951, 70 y.o.   MRN: [id number] [facility] PROGRESS NOTE  Jimmy West NID:[id number] DOB: 1951/01/19 DOA: 05/29/2021 PCP: Pcp, No  HPI/Subjective: Patient feels a little bit better than when he came in.  Still has a little cough and shortness of breath.  Undergoing treatment for pneumonia and COVID 19 infection.  Currently on 10 L high flow nasal cannula.  Objective: Vitals:   06/18/21 0117 06/18/21 1134  BP: 116/69 124/78  Pulse: (!) 103 (!) 102  Resp: 18 20  Temp: 97.8 F (36.6 C) 98.4 F (36.9 C)  SpO2: 93% 98%   No intake or output data in the 24 hours ending 06/18/21 1214 Filed Weights   06/19/2021 2023 06/17/21 0256  Weight: 60.1 kg 59.4 kg    ROS: Review of Systems  Respiratory:  Positive for cough and shortness of breath.   Cardiovascular:  Negative for chest pain.  Gastrointestinal:  Negative for abdominal pain, nausea and vomiting.  Exam: Physical Exam HENT:     Head: Normocephalic.     Mouth/Throat:     Pharynx: No oropharyngeal exudate.  Eyes:     General: Lids are normal.     Conjunctiva/sclera: Conjunctivae normal.  Cardiovascular:     Rate and Rhythm: Normal rate and regular rhythm.     Heart sounds: Normal heart sounds, S1 normal and S2 normal.  Pulmonary:     Breath sounds: Examination of the right-lower field reveals decreased breath sounds and rhonchi. Examination of the left-lower field reveals decreased breath sounds and rhonchi. Decreased breath sounds and rhonchi present. No wheezing or rales.  Abdominal:     Palpations: Abdomen is soft.     Tenderness: There is no abdominal tenderness.  Musculoskeletal:     Right lower leg: No swelling.     Left lower leg: No swelling.  Skin:    General: Skin is warm.     Findings: No rash.  Neurological:     Mental Status: He is alert and oriented to person, place, and time.      Scheduled Meds:  apixaban  5 mg Oral BID    Chlorhexidine Gluconate Cloth  6 each Topical Daily   doxycycline  100 mg Oral Q12H   feeding supplement  237 mL Oral BID BM   FLUoxetine  40 mg Oral Daily   methylPREDNISolone (SOLU-MEDROL) injection  40 mg Intravenous Q12H   OLANZapine  10 mg Oral QHS   sodium chloride flush  10-40 mL Intracatheter Q12H   Continuous Infusions:  cefTRIAXone (ROCEPHIN)  IV 2 g (06/17/21 1322)   remdesivir 100 mg in NS 100 mL 100 mg (06/18/21 0836)   Brief history: 70 year old man with stage IV lung cancer followed by hospice, chronic pain and history of PE.  He presented to the hospital with acute on chronic hypoxic hypercapnic respiratory failure initially requiring BiPAP and currently on 10 L high flow nasal cannula.  Also severe sepsis with COVID-19 and possible bacterial pneumonia.  Patient had elevated troponin likely demand ischemia from acute respiratory distress but could also be NSTEMI.  Initially the patient was thinking about comfort care measures but since the patient is mentating he did want treatment for pneumonia and COVID-19.  Assessment/Plan:  Acute on chronic hypoxic hypercapnic respiratory failure secondary to COVID-19 pneumonia.  Initially with EMS pulse ox was 70%.  The patient chronically wears 7 to 8 L of oxygen.  Currently on 10 L high flow nasal cannula.  Pulse ox is 98% so hopefully can start tapering oxygen. Severe sepsis, present on admission with acute metabolic encephalopathy and COVID-19 pneumonia and possible bacterial pneumonia.  Continue Solu-Medrol, remdesivir, Rocephin and doxycycline 5-day course.  The patient is a DO NOT RESUSCITATE and still interested in following with hospice. Acute metabolic encephalopathy.  This is much improved.  Had elevated CO2 on presentation. Stage IV lung cancer.  Patient is a DO NOT RESUSCITATE and follows with hospice. Elevated troponin and suspected NSTEMI but also could be elevated from demand ischemia with acute respiratory distress.  No  further work-up with his stage IV lung cancer.  No complaints of chest pain. Chronic pain on oxycodone History of PE on Eliquis Severe protein calorie malnutrition        Code Status:     Code Status Orders  (From admission, onward)           Start     Ordered   06/15/21 1239  Do not attempt resuscitation (DNR)  Continuous       Question Answer Comment  In the event of cardiac or respiratory ARREST Do not call a "code blue"   In the event of cardiac or respiratory ARREST Do not perform Intubation, CPR, defibrillation or ACLS   In the event of cardiac or respiratory ARREST Use medication by any route, position, wound care, and other measures to relive pain and suffering. May use oxygen, suction and manual treatment of airway obstruction as needed for comfort.      06/15/21 1239           Code Status History     Date Active Date Inactive Code Status Order ID Comments User Context   06/15/2021 0251 06/15/2021 1239 DNR [id number]  Athena Masse, MD ED   05/25/2021 2026 06/15/2021 0251 DNR [id number]  Vanessa [place], MD ED   05/07/2021 1758 06/13/2021 0102 DNR [id number]  Enzo Bi, MD ED   05/07/2021 1720 05/07/2021 1758 DNR [id number]  Vladimir Crofts, MD ED   04/24/2021 1402 04/28/2021 2059 DNR [id number]  Cox, [place], DO ED   04/24/2021 1235 04/24/2021 1401 Full Code [id number]  Cox, Amy N, DO ED   03/04/2019 1811 03/05/2019 2003 Full Code [id number]  Hillary Bow, MD Inpatient   02/11/2019 1647 02/13/2019 2012 Full Code [id number]  Lang Snow, NP Inpatient      Advance Directive Documentation    Flowsheet Row Most Recent Value  Type of Advance Directive Healthcare Power of Attorney  Pre-existing out of facility DNR order (yellow form or pink MOST form) --  "MOST" Form in Place? --      Family Communication: Spoke with friend [name] Disposition Plan: Status is: Inpatient.  Transitional care team looking into options.  May end up having too much of an oxygen  requirements in order to go to rehab.  Antibiotics: Rocephin and Zithromax Remdesivir  Time spent: 28 minutes  [place]

## 2021-06-18 NOTE — Progress Notes (Signed)
[facility]) Hospitalized Hospice Patient Visit   Jimmy West is a current hospice patient with a terminal diagnosis of malignant neoplasm of lower lobe, right bronchus or lung. Patient was admitted to hospice services on Friday, 11.25.22. Later in the evening of 11.25.22 patient's O2 sats dropped into the 60-70s. Patient's friend [name] decided to call 911 and have patient transported to [facility] for evaluation. Patient was admitted with diagnosis of pneumonia and Covid. Per Dr. Gilford Rile with [facility], this is a related hospital admission.    Unable to go into patient's room due to Covid. Spoke with patient's nurse. Patient is alert and oriented. However, per RN, patient did have some confusion this morning but this improved with pain medication and Ativan. Patient did eat lunch today. Patient continues to want treatment of pneumonia and Covid. Spoke with patient's friend [name] for update.    Patient remains inpatient appropriate in order to receive IV steroids, IV antibiotics and IV remdesivir to treat pneumonia and Covid infection.   V/S:   06/18/21 0117 06/18/21 1134  BP: 116/69 124/78  Pulse: (!) 103 (!) 102  Resp: 18 20  Temp: 97.8 F (36.6 C) 98.4 F (36.9 C)  SpO2: 93% 98%       I/O:  No intake documented at this time for 11/29, O: 300   Abnormal Labs:  as of 11/25 no new labs since 11/25 Chloride: 96 (L) CO2: 36 (H) Glucose: 138 (H) BUN: 27 (H) Albumin: 3.3 (L) B Natriuretic Peptide: 770.2 (H) Troponin I (High Sensitivity): 340 (HH) WBC: 23.1 (H) MCV: 101.6 (H) RDW: 15.9 (H) NEUT#: 19.7 (H) Monocyte #: 1.9 (H) Eosinophils Absolute: 0.6 (H) Abs Immature Granulocytes: 0.16 (H) Covid Positive   Diagnostics:  EXAM: PORTABLE CHEST 1 VIEW   IMPRESSION: Progression of bilateral mid to lower lung field airspace opacities compared to the prior radiograph.   IV/PRN: Remdesivir 200 mg IV x 1 dose, Remdesivir 100 mg IV daily,  Vancomycin 1 gm IV x 1 dose, Vancomycin 500 mg IV daily, Maxipime 2 gm IV x 2 doses, Rocephin 2 gm IV daily, Solu-medrol 40 mg IV every 12 hours, Lasix 60 mg IV x 1 dose, Morphine 1 mg x 3 doses.   Problem List:  1.         Acute on chronic hypoxic hypercapnic respiratory failure.  Patient had a recent hospitalization for [place] induced pneumonitis treated with steroids.  He was discharged home on 7 L of oxygen.  Came back with hypoxia with pulse ox of 70% by EMS.  Patient was on BiPAP initially and now on high flow nasal cannula.  He was found to be COVID-positive. 2.         Severe sepsis, present on admission with acute metabolic encephalopathy and COVID-19 pneumonia with possible bacterial pneumonia.  Patient changed his comfort care measures order to treat pneumonia and COVID infection.  Continue Solu-Medrol.  Added remdesivir.  Added Rocephin and doxycycline.  Patient is still a DO NOT RESUSCITATE.  Patient still interested in hospice. 3.         Acute metabolic encephalopathy this has resolved.  Likely from elevated CO2. 4.         Stage IV lung cancer.  Patient is a DO NOT RESUSCITATE. 5.         NSTEMI from demand ischemia from acute respiratory distress 6.         Chronic pain on oxycodone 7.  History of PE restart Eliquis. 8.         Severe protein calorie malnutrition. Discharge Planning: Ongoing. Plan to discharge home with hospice services, if stable.    Family Contact: Spoke with friend [name] by phone.   IDT: Updated   Goals of Care: Clear, treat the treatable.    Please do not hesitate to call with any hospice related questions or concerns.    Thank you, Daphene Calamity, MSW [facility] Liaison [phone]

## 2021-06-19 DIAGNOSIS — J9622 Acute and chronic respiratory failure with hypercapnia: Secondary | ICD-10-CM | POA: Diagnosis not present

## 2021-06-19 DIAGNOSIS — J9621 Acute and chronic respiratory failure with hypoxia: Secondary | ICD-10-CM | POA: Diagnosis not present

## 2021-06-19 DIAGNOSIS — C3492 Malignant neoplasm of unspecified part of left bronchus or lung: Secondary | ICD-10-CM | POA: Diagnosis not present

## 2021-06-19 DIAGNOSIS — E440 Moderate protein-calorie malnutrition: Secondary | ICD-10-CM | POA: Insufficient documentation

## 2021-06-19 LAB — BASIC METABOLIC PANEL
Anion gap: 4 — ABNORMAL LOW (ref 5–15)
BUN: 38 mg/dL — ABNORMAL HIGH (ref 8–23)
CO2: 35 mmol/L — ABNORMAL HIGH (ref 22–32)
Calcium: 8.5 mg/dL — ABNORMAL LOW (ref 8.9–10.3)
Chloride: 100 mmol/L (ref 98–111)
Creatinine, Ser: 0.95 mg/dL (ref 0.61–1.24)
GFR, Estimated: >60 mL/min (ref >60)
Glucose, Bld: 122 mg/dL — ABNORMAL HIGH (ref 70–99)
Potassium: 4.7 mmol/L (ref 3.5–5.1)
Sodium: 139 mmol/L (ref 135–145)

## 2021-06-19 LAB — CBC
HCT: 28.7 % — ABNORMAL LOW (ref 39.0–52.0)
Hemoglobin: 8.9 g/dL — ABNORMAL LOW (ref 13.0–17.0)
MCH: 30.5 pg (ref 26.0–34.0)
MCHC: 31 g/dL (ref 30.0–36.0)
MCV: 98.3 fL (ref 80.0–100.0)
Platelets: 171 10*3/uL (ref 150–400)
RBC: 2.92 MIL/uL — ABNORMAL LOW (ref 4.22–5.81)
RDW: 15.2 % (ref 11.5–15.5)
WBC: 9.5 10*3/uL (ref 4.0–10.5)
nRBC: 0 % (ref 0.0–0.2)

## 2021-06-19 LAB — CULTURE, BLOOD (ROUTINE X 2)
Culture: NO GROWTH
Special Requests: ADEQUATE

## 2021-06-19 MED ORDER — HYDROMORPHONE HCL 1 MG/ML IJ SOLN
0.5000 mg | INTRAMUSCULAR | Status: DC | PRN
  Administered 2021-06-19 (×2): 0.5 mg via INTRAVENOUS
  Filled 2021-06-19 (×2): qty 1

## 2021-06-19 MED ORDER — HYDROMORPHONE HCL 1 MG/ML IJ SOLN
1.0000 mg | INTRAMUSCULAR | Status: DC | PRN
  Administered 2021-06-19 – 2021-06-20 (×2): 1 mg via INTRAVENOUS
  Filled 2021-06-19 (×2): qty 1

## 2021-06-19 NOTE — Progress Notes (Signed)
Nutrition Follow-up  DOCUMENTATION CODES:   Non-severe (moderate) malnutrition in context of chronic illness  INTERVENTION:   -Continue Ensure Enlive po BID, each supplement provides 350 kcal and 20 grams of protein  -Continue MVI with minerals daily  NUTRITION DIAGNOSIS:   Moderate Malnutrition related to chronic illness (stage IV lung cancer) as evidenced by mild fat depletion, mild muscle depletion, moderate muscle depletion.  Ongoing  GOAL:   Patient will meet greater than or equal to 90% of their needs  Progressing   MONITOR:   PO intake, Supplement acceptance, Labs, Weight trends, Skin, I & O's  REASON FOR ASSESSMENT:   Malnutrition Screening Tool    ASSESSMENT:   Pt admitted with severe sepsis, acute metabolic encephalopathy, and acute on chronic hypoxic hypercapnic respiratory failure 2/2 COVID-19 PNA with possible bacterial PNA. PMH significant for Stage IV lung cancer, chronic constipation, CAD s/p stent 2005, chronic respiratory failure on home O2 at 6-8 L, anxiety and depression, s/p prolonged hospitalization from 10/18-11/24 for Keytruda induced pneumonitis, NSTEMI managed medically and discharged to hospiceOf note, pt changed his comfort measures order to treat PNA and COVID infection, but remains DNR. Pt still interested in hospice per MD.  Reviewed I/O's: -200 ml x 24 hours and -2.1 L since admission  UOP: 400 ml x 24 hours  Spoke with pt at bedside, who was pleasant and in good spirits today. He reports feeling much better today. He shares that his appetite has improved; noted he consumed 100% of his lunch.   PTA, pt reports decreased oral intake over the past week, due to not feeling well. He shares with this RD that he was just recently discharged from the hospital after a lengthy hospital stay (pt reports good appetite during previous hospitalization).    Per pt, his UBW is around 145#. He denies any weight loss.   Discussed importance of good meal  and supplement intake to promote healing. Pt amenable to continue Ensure.   Labs reviewed.   NUTRITION - FOCUSED PHYSICAL EXAM:  Flowsheet Row Most Recent Value  Orbital Region Mild depletion  Upper Arm Region Mild depletion  Thoracic and Lumbar Region No depletion  Buccal Region Mild depletion  Temple Region Moderate depletion  Clavicle Bone Region Mild depletion  Clavicle and Acromion Bone Region Mild depletion  Scapular Bone Region Mild depletion  Dorsal Hand Mild depletion  Patellar Region Moderate depletion  Anterior Thigh Region Moderate depletion  Posterior Calf Region Moderate depletion  Edema (RD Assessment) None  Hair Reviewed  Eyes Reviewed  Mouth Reviewed  Skin Reviewed  Nails Reviewed       Diet Order:   Diet Order             DIET DYS 3 Room service appropriate? Yes; Fluid consistency: Thin  Diet effective now                   EDUCATION NEEDS:   Education needs have been addressed  Skin:  Skin Assessment: Reviewed RN Assessment  Last BM:  06/18/21  Height:   Ht Readings from Last 1 Encounters:  05/23/2021 5\' 6"  (1.676 m)    Weight:   Wt Readings from Last 1 Encounters:  06/17/21 59.4 kg    BMI:  Body mass index is 21.14 kg/m.  Estimated Nutritional Needs:   Kcal:  1900-2100  Protein:  105-120 grams  Fluid:  > 1.9 L    Loistine Chance, RD, LDN, [place] Registered Dietitian II Certified Diabetes Care and Education  Specialist Please refer to [facility] for RD and/or RD on-call/weekend/after hours pager

## 2021-06-19 NOTE — Progress Notes (Signed)
[facility]) Hospitalized Hospice Patient Visit   Jimmy West is a current hospice patient with a terminal diagnosis of malignant neoplasm of lower lobe, right bronchus or lung. Patient was admitted to hospice services on Friday, 11.25.22. Later in the evening of 11.25.22 patient's O2 sats dropped into the 60-70s. Patient's friend [name] decided to call 911 and have patient transported to [facility] for evaluation. Patient was admitted with diagnosis of pneumonia and Covid. Per Dr. Gilford Rile with [facility], this is a related hospital admission.    Unable to go into patient's room due to Covid isolation. Spoke with patient's nurse. Per nurse, patient is on 8L O2. MD states patient may be Hospice Home appropriate if no improvement. He had experienced some anxiety this AM. Nurse administered Ativan which did relieve anxiety. Hospital liaison will continue to follow for discharge disposition. Phone call to update friend [name]. No answer.    Patient remains inpatient appropriate in order to receive IV steroids, IV antibiotics and IV remdesivir to treat pneumonia and Covid infection.   V/S: 97.9 oral, 117/84, 102, 18, 95% on 8L via HFNC   I/O:  200/400   Abnormal Labs:  CO2: 35 (H) Glucose: 122 (H) BUN: 38 (H) Calcium: 8.5 (L) Anion gap: 4 (L) RBC: 2.92 (L) Hemoglobin: 8.9 (L) HCT: 28.7 (L)  Diagnostics: None   IV/PRN: Remdesivir 100 mg IV daily, Rocephin 2 gm IV daily, Solu-medrol 40 mg IV every 12 hours.   Problem List: Principal Problem:   Acute on chronic respiratory failure with hypoxia and hypercapnia (HCC) Active Problems:   Primary cancer of right lower lobe of lung (HCC)   Cancer-related pain   Palliative care encounter   Depression   Chronic anticoagulation   NSTEMI (non-ST elevated myocardial infarction) ([place])   HCAP (healthcare-associated pneumonia)   Severe protein-calorie malnutrition ([place])   Pneumonia due to COVID-19 virus   Severe  sepsis ([place])   Acute metabolic encephalopathy   Acute on chronic hypoxic hypercapnic respiratory failure secondary to COVID-19 pneumonia.  Initially with EMS pulse ox was 70%.  The patient chronically wears 7 to 8 L of oxygen.  Slowly being weaned down.  Currently on 8 L with appropriate pulse oxygenation. Severe sepsis, present on admission with acute metabolic encephalopathy and COVID-19 pneumonia and possible bacterial pneumonia.  Continue Solu-Medrol, remdesivir, Rocephin and doxycycline 5-day course.  The patient is a DO NOT RESUSCITATE and still interested in following with hospice.  We will optimize infectious and respiratory status is much as possible prior to discharge Acute metabolic encephalopathy.  This is much improved.  Had elevated CO2 on presentation.  Baseline level of mentation Stage IV lung cancer.  Patient is a DO NOT RESUSCITATE and follows with hospice. Elevated troponin and suspected NSTEMI but also could be elevated from demand ischemia with acute respiratory distress.  No further work-up with his stage IV lung cancer.  No complaints of chest pain. Chronic pain on oxycodone History of PE on Eliquis Severe protein calorie malnutrition  Discharge Planning: Ongoing. Plan to discharge home with hospice services, if stable or discharge to [facility], if eligible.   Family Contact: Phone call to update friend [name]. No answer.    IDT: Updated   Goals of Care: Clear, treat the treatable.    Please do not hesitate to call with any hospice related questions or concerns.    Thank you,    Bobbie "Loren Racer, Arthur, BSN [facility] Liaison [phone]

## 2021-06-19 NOTE — TOC Progression Note (Signed)
Transition of Care [facility]) - Progression Note    Patient Details  Name: Amir Fick MRN: [id number] Date of Birth: [birth year]  Transition of Care [facility]) CM/SW [place], RN Phone Number: 06/19/2021, 4:06 PM  Clinical Narrative:   Patient is on COVID isolation, potential for hospice.    Expected Discharge Plan: Home w Hospice Care Barriers to Discharge: Continued Medical Work up  Expected Discharge Plan and Services Expected Discharge Plan: [place]   Discharge Planning Services: CM Consult Post Acute Care Choice: Hospice Living arrangements for the past 2 months: Single Family Home                                       Social Determinants of Health (SDOH) Interventions    Readmission Risk Interventions Readmission Risk Prevention Plan 06/15/2021 04/26/2021  Transportation Screening Complete Complete  PCP or Specialist Appt within 3-5 Days - Complete  HRI or [place] - Complete  Social Work Consult for [place] Planning/Counseling - Complete  Palliative Care Screening - Not Applicable  Medication Review Press photographer) Referral to Pharmacy Complete  PCP or Specialist appointment within 3-5 days of discharge Complete -  [place] or Home Care Consult Complete -  SW Recovery Care/Counseling Consult Complete -  Palliative Care Screening Complete -  [place] Not Applicable -  Some recent data might be hidden

## 2021-06-19 NOTE — Progress Notes (Signed)
PT Cancellation Note  Patient Details Name: Jimmy West MRN: [id number] DOB: 05-Mar-1951   Cancelled Treatment:    Reason Eval/Treat Not Completed: Other (comment). Per rounds and MD, PT orders completed at this time, pt pending transition to hospice. PT to sign off.  Lieutenant Diego PT, DPT 11:40 AM,06/19/21

## 2021-06-19 NOTE — Progress Notes (Signed)
PROGRESS NOTE    Jimmy West  UUV:[id number] DOB: 02-Feb-1951 DOA: 06/09/2021 PCP: Pcp, No    Brief Narrative:  70 year old man with stage IV lung cancer followed by hospice, chronic pain and history of PE.  He presented to the hospital with acute on chronic hypoxic hypercapnic respiratory failure initially requiring BiPAP and currently on 10 L high flow nasal cannula.  Also severe sepsis with COVID-19 and possible bacterial pneumonia.  Patient had elevated troponin likely demand ischemia from acute respiratory distress but could also be NSTEMI.  Initially the patient was thinking about comfort care measures but since the patient is mentating he did want treatment for pneumonia and COVID-19.   Assessment & Plan:   Principal Problem:   Acute on chronic respiratory failure with hypoxia and hypercapnia (HCC) Active Problems:   Primary cancer of right lower lobe of lung (HCC)   Cancer-related pain   Palliative care encounter   Depression   Chronic anticoagulation   NSTEMI (non-ST elevated myocardial infarction) ([place])   HCAP (healthcare-associated pneumonia)   Severe protein-calorie malnutrition (HCC)   Pneumonia due to COVID-19 virus   Severe sepsis ([place])   Acute metabolic encephalopathy  Acute on chronic hypoxic hypercapnic respiratory failure secondary to COVID-19 pneumonia.  Initially with EMS pulse ox was 70%.  The patient chronically wears 7 to 8 L of oxygen.  Slowly being weaned down.  Currently on 8 L with appropriate pulse oxygenation. Severe sepsis, present on admission with acute metabolic encephalopathy and COVID-19 pneumonia and possible bacterial pneumonia.  Continue Solu-Medrol, remdesivir, Rocephin and doxycycline 5-day course.  The patient is a DO NOT RESUSCITATE and still interested in following with hospice.  We will optimize infectious and respiratory status is much as possible prior to discharge Acute metabolic encephalopathy.  This is much improved.  Had  elevated CO2 on presentation.  Baseline level of mentation Stage IV lung cancer.  Patient is a DO NOT RESUSCITATE and follows with hospice. Elevated troponin and suspected NSTEMI but also could be elevated from demand ischemia with acute respiratory distress.  No further work-up with his stage IV lung cancer.  No complaints of chest pain. Chronic pain on oxycodone History of PE on Eliquis Severe protein calorie malnutrition     DVT prophylaxis: Eliquis Code Status: DNR Family Communication: None today Disposition Plan: Status is: Inpatient  Remains inpatient appropriate because: Patient active with hospice but still treating treatable disease.  Will complete 5-day course of empiric antibiotics in house.  Last dose 12/1       Level of care: Med-Surg  Consultants:  None  Procedures:  None  Antimicrobials: Rocephin Azithromycin   Subjective: Seen and examined.  Resting the bed.  No visible distress.  Does endorse pain and anxiety.  Objective: Vitals:   06/18/21 0117 06/18/21 1134 06/18/21 1354 06/19/21 0309  BP: 116/69 124/78  117/84  Pulse: (!) 103 (!) 102  (!) 102  Resp: 18 20  18   Temp: 97.8 F (36.6 C) 98.4 F (36.9 C)  97.9 F (36.6 C)  TempSrc:    Oral  SpO2: 93% 98% 98% 95%  Weight:      Height:        Intake/Output Summary (Last 24 hours) at 06/19/2021 1421 Last data filed at 06/18/2021 1900 Gross per 24 hour  Intake 200 ml  Output 400 ml  Net -200 ml   Filed Weights   06/19/2021 2023 06/17/21 0256  Weight: 60.1 kg 59.4 kg    Examination:  General  exam: No acute distress.  Appears frail and fatigued Respiratory system: Scattered crackles.  Normal work of breathing.  8 L Cardiovascular system: S1-S2, RRR, no murmurs, no pedal edema Gastrointestinal system: Abdomen is nondistended, soft and nontender. No organomegaly or masses felt. Normal bowel sounds heard. Central nervous system: Alert and oriented. No focal neurological  deficits. Extremities: Symmetric 5 x 5 power. Skin: No rashes, lesions or ulcers Psychiatry: Judgement and insight appear normal. Mood & affect appropriate.     Data Reviewed: I have personally reviewed following labs and imaging studies  CBC: Recent Labs  Lab 06/08/2021 2122 06/19/21 0517  WBC 23.1* 9.5  NEUTROABS 19.7*  --   HGB 13.1 8.9*  HCT 43.6 28.7*  MCV 101.6* 98.3  PLT 309 194   Basic Metabolic Panel: Recent Labs  Lab 06/17/2021 2122 06/19/21 0517  NA 141 139  K 3.8 4.7  CL 96* 100  CO2 36* 35*  GLUCOSE 138* 122*  BUN 27* 38*  CREATININE 1.03 0.95  CALCIUM 9.5 8.5*   GFR: Estimated Creatinine Clearance: 59.9 mL/min (by C-G formula based on SCr of 0.95 mg/dL). Liver Function Tests: Recent Labs  Lab 06/12/2021 2122  AST 23  ALT 25  ALKPHOS 108  BILITOT 1.2  PROT 7.1  ALBUMIN 3.3*   Recent Labs  Lab 06/07/2021 2122  LIPASE 33   No results for input(s): AMMONIA in the last 168 hours. Coagulation Profile: Recent Labs  Lab 06/15/21 0230  INR 1.1   Cardiac Enzymes: No results for input(s): CKTOTAL, CKMB, CKMBINDEX, TROPONINI in the last 168 hours. BNP (last 3 results) No results for input(s): PROBNP in the last 8760 hours. HbA1C: No results for input(s): HGBA1C in the last 72 hours. CBG: No results for input(s): GLUCAP in the last 168 hours. Lipid Profile: No results for input(s): CHOL, HDL, LDLCALC, TRIG, CHOLHDL, LDLDIRECT in the last 72 hours. Thyroid Function Tests: No results for input(s): TSH, T4TOTAL, FREET4, T3FREE, THYROIDAB in the last 72 hours. Anemia Panel: No results for input(s): VITAMINB12, FOLATE, FERRITIN, TIBC, IRON, RETICCTPCT in the last 72 hours. Sepsis Labs: Recent Labs  Lab 05/28/2021 2122 06/15/21 0623 06/15/21 0630  PROCALCITON 1.04 22.44  --   LATICACIDVEN 1.7  --  1.3    Recent Results (from the past 240 hour(s))  Blood culture (routine x 2)     Status: None   Collection Time: 06/15/2021  9:22 PM   Specimen: BLOOD   Result Value Ref Range Status   Specimen Description BLOOD RIGHT ANTECUBITAL  Final   Special Requests   Final    BOTTLES DRAWN AEROBIC AND ANAEROBIC Blood Culture adequate volume   Culture   Final    NO GROWTH 5 DAYS Performed at [facility], [address] [place], [place] [address]    Report Status 06/19/2021 FINAL  Final  Resp Panel by RT-PCR (Flu A&B, Covid) Nasopharyngeal Swab     Status: Abnormal   Collection Time: 06/03/2021 11:07 PM   Specimen: Nasopharyngeal Swab; Nasopharyngeal(NP) swabs in vial transport medium  Result Value Ref Range Status   SARS Coronavirus 2 by RT PCR POSITIVE (A) NEGATIVE Final    Comment: RESULT CALLED TO, READ BACK BY AND VERIFIED WITH: MELISSA HOUP 0112 06/15/21 LFD (NOTE) SARS-CoV-2 target nucleic acids are DETECTED.  The SARS-CoV-2 RNA is generally detectable in upper respiratory specimens during the acute phase of infection. Positive results are indicative of the presence of the identified virus, but do not rule out bacterial infection or  co-infection with other pathogens not detected by the test. Clinical correlation with patient history and other diagnostic information is necessary to determine patient infection status. The expected result is Negative.  Fact Sheet for Patients: EntrepreneurPulse.com.au  Fact Sheet for Healthcare Providers: IncredibleEmployment.be  This test is not yet approved or cleared by the [place] FDA and  has been authorized for detection and/or diagnosis of SARS-CoV-2 by FDA under an Emergency Use Authorization (EUA).  This EUA will remain in effect (meaning this test can be [place] ed) for the duration of  the COVID-19 declaration under Section 564(b)(1) of the Act, 21 U.S.C. section 360bbb-3(b)(1), unless the authorization is terminated or revoked sooner.     Influenza A by PCR NEGATIVE NEGATIVE Final   Influenza B by PCR NEGATIVE NEGATIVE Final    Comment:  (NOTE) The Xpert Xpress SARS-CoV-2/FLU/RSV plus assay is intended as an aid in the diagnosis of influenza from Nasopharyngeal swab specimens and should not be used as a sole basis for treatment. Nasal washings and aspirates are unacceptable for Xpert Xpress SARS-CoV-2/FLU/RSV testing.  Fact Sheet for Patients: EntrepreneurPulse.com.au  Fact Sheet for Healthcare Providers: IncredibleEmployment.be  This test is not yet approved or cleared by the [place] FDA and has been authorized for detection and/or diagnosis of SARS-CoV-2 by FDA under an Emergency Use Authorization (EUA). This EUA will remain in effect (meaning this test can be used) for the duration of the COVID-19 declaration under Section 564(b)(1) of the Act, 21 U.S.C. section 360bbb-3(b)(1), unless the authorization is terminated or revoked.  Performed at [facility], [address] [place], [place] [address]   Blood culture (routine x 2)     Status: None (Preliminary result)   Collection Time: 06/15/21  6:23 AM   Specimen: BLOOD  Result Value Ref Range Status   Specimen Description BLOOD LFA  Final   Special Requests   Final    BOTTLES DRAWN AEROBIC AND ANAEROBIC Blood Culture adequate volume   Culture   Final    NO GROWTH 4 DAYS Performed at [facility], [address] [place], [place] [address]    Report Status PENDING  Incomplete         Radiology Studies: No results found.      Scheduled Meds:  apixaban  5 mg Oral BID   Chlorhexidine Gluconate Cloth  6 each Topical Daily   doxycycline  100 mg Oral Q12H   feeding supplement  237 mL Oral BID BM   FLUoxetine  40 mg Oral Daily   methylPREDNISolone (SOLU-MEDROL) injection  40 mg Intravenous Q12H   OLANZapine  10 mg Oral QHS   sodium chloride flush  10-40 mL Intracatheter Q12H   Continuous Infusions:  cefTRIAXone (ROCEPHIN)  IV Stopped (06/18/21 2238)     LOS: 5 days    Time spent:  25 minutes    Sidney Ace, MD [facility]   If 7PM-7AM, please contact night-coverage  06/19/2021, 2:21 PM

## 2021-06-20 ENCOUNTER — Ambulatory Visit: Payer: Medicare HMO | Admitting: Cardiology

## 2021-06-20 DIAGNOSIS — J9622 Acute and chronic respiratory failure with hypercapnia: Secondary | ICD-10-CM | POA: Diagnosis not present

## 2021-06-20 DIAGNOSIS — J9621 Acute and chronic respiratory failure with hypoxia: Secondary | ICD-10-CM | POA: Diagnosis not present

## 2021-06-20 LAB — CULTURE, BLOOD (ROUTINE X 2)
Culture: NO GROWTH
Special Requests: ADEQUATE

## 2021-06-20 MED ORDER — HYDROMORPHONE HCL 1 MG/ML IJ SOLN
1.5000 mg | INTRAMUSCULAR | Status: DC | PRN
  Administered 2021-06-20 – 2021-06-24 (×7): 1.5 mg via INTRAVENOUS
  Filled 2021-06-20 (×7): qty 2

## 2021-06-20 NOTE — Progress Notes (Signed)
PROGRESS NOTE    Wilfrid Hyser  OZD:[id number] DOB: 1951/04/13 DOA: 06/13/2021 PCP: Pcp, No    Brief Narrative:  70 year old man with stage IV lung cancer followed by hospice, chronic pain and history of PE.  He presented to the hospital with acute on chronic hypoxic hypercapnic respiratory failure initially requiring BiPAP and currently on 10 L high flow nasal cannula.  Also severe sepsis with COVID-19 and possible bacterial pneumonia.  Patient had elevated troponin likely demand ischemia from acute respiratory distress but could also be NSTEMI.  Initially the patient was thinking about comfort care measures but since the patient is mentating he did want treatment for pneumonia and COVID-19.   Assessment & Plan:   Principal Problem:   Acute on chronic respiratory failure with hypoxia and hypercapnia (HCC) Active Problems:   Primary cancer of right lower lobe of lung (HCC)   Cancer-related pain   Palliative care encounter   Depression   Chronic anticoagulation   NSTEMI (non-ST elevated myocardial infarction) ([place])   HCAP (healthcare-associated pneumonia)   Severe protein-calorie malnutrition (HCC)   Pneumonia due to COVID-19 virus   Severe sepsis ([place])   Acute metabolic encephalopathy   Malnutrition of moderate degree  Acute on chronic hypoxic hypercapnic respiratory failure secondary to COVID-19 pneumonia.  Initially with EMS pulse ox was 70%.  The patient chronically wears 7 to 8 L of oxygen.  Slowly being weaned down.  Currently on 8 L with appropriate pulse oxygenation.  Have been unable to wean down further. Severe sepsis, present on admission with acute metabolic encephalopathy and COVID-19 pneumonia and possible bacterial pneumonia.  Continue Solu-Medrol, remdesivir, Rocephin and doxycycline 5-day course.  Last dose will be today 12/1.  The patient is a DO NOT RESUSCITATE and still interested in following with hospice.  We will optimize infectious and respiratory status  is much as possible prior to discharge.  Discharge to home with hospice versus hospice facility Acute metabolic encephalopathy.  This is much improved.  Had elevated CO2 on presentation.  Baseline level of mentation Stage IV lung cancer.  Patient is a DO NOT RESUSCITATE and follows with hospice. Elevated troponin and suspected NSTEMI but also could be elevated from demand ischemia with acute respiratory distress.  No further work-up with his stage IV lung cancer.  No complaints of chest pain. Chronic pain on oxycodone History of PE on Eliquis Severe protein calorie malnutrition     DVT prophylaxis: Eliquis Code Status: DNR Family Communication: None today Disposition Plan: Status is: Inpatient  Remains inpatient appropriate because: Patient active with hospice but still treating treatable disease.  Will complete 5-day course of empiric antibiotics in house.  Last dose 12/1.         Level of care: Med-Surg  Consultants:  None  Procedures:  None  Antimicrobials: Rocephin Azithromycin   Subjective: Patient seen and examined.  Resting in bed.  Does endorse improved pain control from increased dose of IV Dilaudid but not yet well controlled.  Objective: Vitals:   06/18/21 1354 06/19/21 0309 06/19/21 1425 06/20/21 0841  BP:  117/84 95/66 99/69   Pulse:  (!) 102 (!) 113 (!) 105  Resp:  18 18 18   Temp:  97.9 F (36.6 C) 98.8 F (37.1 C) 97.9 F (36.6 C)  TempSrc:  Oral Oral Oral  SpO2: 98% 95% 95% 95%  Weight:      Height:        Intake/Output Summary (Last 24 hours) at 06/20/2021 1125 Last data filed at  06/20/2021 0903 Gross per 24 hour  Intake --  Output 1350 ml  Net -1350 ml   Filed Weights   06/06/2021 2023 06/17/21 0256  Weight: 60.1 kg 59.4 kg    Examination:  General exam: No apparent distress.  Frail and fatigued.  Chronically ill Respiratory system: Appears fatigued.  Scattered crackles.  Normal work of breathing.  8 L Cardiovascular system: S1-S2,  RRR, no murmurs, no pedal edema Gastrointestinal system: Abdomen is nondistended, soft and nontender. No organomegaly or masses felt. Normal bowel sounds heard. Central nervous system: Alert and oriented. No focal neurological deficits. Extremities: Symmetric 5 x 5 power. Skin: No rashes, lesions or ulcers Psychiatry: Judgement and insight appear normal. Mood & affect appropriate.     Data Reviewed: I have personally reviewed following labs and imaging studies  CBC: Recent Labs  Lab 06/17/2021 2122 06/19/21 0517  WBC 23.1* 9.5  NEUTROABS 19.7*  --   HGB 13.1 8.9*  HCT 43.6 28.7*  MCV 101.6* 98.3  PLT 309 244   Basic Metabolic Panel: Recent Labs  Lab 06/06/2021 2122 06/19/21 0517  NA 141 139  K 3.8 4.7  CL 96* 100  CO2 36* 35*  GLUCOSE 138* 122*  BUN 27* 38*  CREATININE 1.03 0.95  CALCIUM 9.5 8.5*   GFR: Estimated Creatinine Clearance: 59.9 mL/min (by C-G formula based on SCr of 0.95 mg/dL). Liver Function Tests: Recent Labs  Lab 05/29/2021 2122  AST 23  ALT 25  ALKPHOS 108  BILITOT 1.2  PROT 7.1  ALBUMIN 3.3*   Recent Labs  Lab 06/11/2021 2122  LIPASE 33   No results for input(s): AMMONIA in the last 168 hours. Coagulation Profile: Recent Labs  Lab 06/15/21 0230  INR 1.1   Cardiac Enzymes: No results for input(s): CKTOTAL, CKMB, CKMBINDEX, TROPONINI in the last 168 hours. BNP (last 3 results) No results for input(s): PROBNP in the last 8760 hours. HbA1C: No results for input(s): HGBA1C in the last 72 hours. CBG: No results for input(s): GLUCAP in the last 168 hours. Lipid Profile: No results for input(s): CHOL, HDL, LDLCALC, TRIG, CHOLHDL, LDLDIRECT in the last 72 hours. Thyroid Function Tests: No results for input(s): TSH, T4TOTAL, FREET4, T3FREE, THYROIDAB in the last 72 hours. Anemia Panel: No results for input(s): VITAMINB12, FOLATE, FERRITIN, TIBC, IRON, RETICCTPCT in the last 72 hours. Sepsis Labs: Recent Labs  Lab 05/28/2021 2122  06/15/21 0623 06/15/21 0630  PROCALCITON 1.04 22.44  --   LATICACIDVEN 1.7  --  1.3    Recent Results (from the past 240 hour(s))  Blood culture (routine x 2)     Status: None   Collection Time: 06/09/2021  9:22 PM   Specimen: BLOOD  Result Value Ref Range Status   Specimen Description BLOOD RIGHT ANTECUBITAL  Final   Special Requests   Final    BOTTLES DRAWN AEROBIC AND ANAEROBIC Blood Culture adequate volume   Culture   Final    NO GROWTH 5 DAYS Performed at [facility], [address] [place], [place] [address]    Report Status 06/19/2021 FINAL  Final  Resp Panel by RT-PCR (Flu A&B, Covid) Nasopharyngeal Swab     Status: Abnormal   Collection Time: 05/23/2021 11:07 PM   Specimen: Nasopharyngeal Swab; Nasopharyngeal(NP) swabs in vial transport medium  Result Value Ref Range Status   SARS Coronavirus 2 by RT PCR POSITIVE (A) NEGATIVE Final    Comment: RESULT CALLED TO, READ BACK BY AND VERIFIED WITH: MELISSA HOUP 0112 06/15/21  LFD (NOTE) SARS-CoV-2 target nucleic acids are DETECTED.  The SARS-CoV-2 RNA is generally detectable in upper respiratory specimens during the acute phase of infection. Positive results are indicative of the presence of the identified virus, but do not rule out bacterial infection or co-infection with other pathogens not detected by the test. Clinical correlation with patient history and other diagnostic information is necessary to determine patient infection status. The expected result is Negative.  Fact Sheet for Patients: EntrepreneurPulse.com.au  Fact Sheet for Healthcare Providers: IncredibleEmployment.be  This test is not yet approved or cleared by the [place] FDA and  has been authorized for detection and/or diagnosis of SARS-CoV-2 by FDA under an Emergency Use Authorization (EUA).  This EUA will remain in effect (meaning this test can be [place] ed) for the duration of  the COVID-19 declaration  under Section 564(b)(1) of the Act, 21 U.S.C. section 360bbb-3(b)(1), unless the authorization is terminated or revoked sooner.     Influenza A by PCR NEGATIVE NEGATIVE Final   Influenza B by PCR NEGATIVE NEGATIVE Final    Comment: (NOTE) The Xpert Xpress SARS-CoV-2/FLU/RSV plus assay is intended as an aid in the diagnosis of influenza from Nasopharyngeal swab specimens and should not be used as a sole basis for treatment. Nasal washings and aspirates are unacceptable for Xpert Xpress SARS-CoV-2/FLU/RSV testing.  Fact Sheet for Patients: EntrepreneurPulse.com.au  Fact Sheet for Healthcare Providers: IncredibleEmployment.be  This test is not yet approved or cleared by the [place] FDA and has been authorized for detection and/or diagnosis of SARS-CoV-2 by FDA under an Emergency Use Authorization (EUA). This EUA will remain in effect (meaning this test can be used) for the duration of the COVID-19 declaration under Section 564(b)(1) of the Act, 21 U.S.C. section 360bbb-3(b)(1), unless the authorization is terminated or revoked.  Performed at [facility], [address] [place], [place] [address]   Blood culture (routine x 2)     Status: None   Collection Time: 06/15/21  6:23 AM   Specimen: BLOOD  Result Value Ref Range Status   Specimen Description BLOOD LFA  Final   Special Requests   Final    BOTTLES DRAWN AEROBIC AND ANAEROBIC Blood Culture adequate volume   Culture   Final    NO GROWTH 5 DAYS Performed at [facility], [address], [place], [place] [address]    Report Status 06/20/2021 FINAL  Final         Radiology Studies: No results found.      Scheduled Meds:  apixaban  5 mg Oral BID   Chlorhexidine Gluconate Cloth  6 each Topical Daily   doxycycline  100 mg Oral Q12H   feeding supplement  237 mL Oral BID BM   FLUoxetine  40 mg Oral Daily   OLANZapine  10 mg Oral QHS   sodium chloride  flush  10-40 mL Intracatheter Q12H   Continuous Infusions:  cefTRIAXone (ROCEPHIN)  IV 2 g (06/19/21 1423)     LOS: 6 days    Time spent: 25 minutes    [place] Ace, MD [facility]   If 7PM-7AM, please contact night-coverage  06/20/2021, 11:25 AM

## 2021-06-20 NOTE — Progress Notes (Signed)
[facility]) Hospitalized Hospice Patient Visit   Jimmy West is a current hospice patient with a terminal diagnosis of malignant neoplasm of lower lobe, right bronchus or lung. Patient was admitted to hospice services on Friday, 11.25.22. Later in the evening of 11.25.22 patient's O2 sats dropped into the 60-70s. Patient's friend [name] decided to call 911 and have patient transported to [facility] for evaluation. Patient was admitted with diagnosis of pneumonia and Covid. Per Dr. Gilford Rile with [facility], this is a related hospital admission.   Unable to go into patient's room due to Covid isolation. Spoke with patient's nurse. Per nurse, patient is on 8L O2. He has been receiving 1.5 mg of dilaudid every 3 hours as needed for pain management. MD feels that patient is [facility] appropriate. TOC aware that patient will need to be off COVID isolation to be evaluated. Isolation ends on 12/6.   Patient remains inpatient appropriate in order to receive IV steroids, IV antibiotics and IV remdesivir to treat pneumonia and Covid infection.  V/S 97.9, 95/66, 18, 95% on 8L via [facility]NC  I/O: none charted for today  Abnormal labs:   CO2: 35 (H) Glucose: 122 (H) BUN: 38 (H) Calcium: 8.5 (L) Anion gap: 4 (L)  Diagnostics: None  IV/PRN: Remdesivir 100 mg IV daily, Rocephin 2 gm IV daily, Solu-medrol 40 mg IV every 12 hours.  Problem List:   Acute on chronic hypoxic hypercapnic respiratory failure secondary to COVID-19 pneumonia.  Initially with EMS pulse ox was 70%.  The patient chronically wears 7 to 8 L of oxygen.  Slowly being weaned down.  Currently on 8 L with appropriate pulse oxygenation.  Have been unable to wean down further. Severe sepsis, present on admission with acute metabolic encephalopathy and COVID-19 pneumonia and possible bacterial pneumonia.  Continue Solu-Medrol, remdesivir, Rocephin and doxycycline 5-day course.  Last dose will be today 12/1.  The patient  is a DO NOT RESUSCITATE and still interested in following with hospice.  We will optimize infectious and respiratory status is much as possible prior to discharge.  Discharge to home with hospice versus hospice facility Acute metabolic encephalopathy.  This is much improved.  Had elevated CO2 on presentation.  Baseline level of mentation Stage IV lung cancer.  Patient is a DO NOT RESUSCITATE and follows with hospice. Elevated troponin and suspected NSTEMI but also could be elevated from demand ischemia with acute respiratory distress.  No further work-up with his stage IV lung cancer.  No complaints of chest pain. Chronic pain on oxycodone History of PE on Eliquis Severe protein calorie malnutrition  Discharge Planning: Ongoing. Plan to discharge home with hospice services, if stable or discharge to [facility], if eligible.   Family Contact: Phone call to update friend [name]. No answer.    IDT: Updated   Goals of Care: Clear, treat the treatable.    Please do not hesitate to call with any hospice related questions or concerns.     Thank you.   Roselee Nova, [facility] [facility] Liaison [phone]

## 2021-06-20 DEATH — deceased

## 2021-06-21 DIAGNOSIS — J9622 Acute and chronic respiratory failure with hypercapnia: Secondary | ICD-10-CM | POA: Diagnosis not present

## 2021-06-21 DIAGNOSIS — J9621 Acute and chronic respiratory failure with hypoxia: Secondary | ICD-10-CM | POA: Diagnosis not present

## 2021-06-21 MED ORDER — OXYCODONE HCL ER 10 MG PO T12A
10.0000 mg | EXTENDED_RELEASE_TABLET | Freq: Two times a day (BID) | ORAL | Status: DC
  Administered 2021-06-21 – 2021-06-24 (×5): 10 mg via ORAL
  Filled 2021-06-21 (×7): qty 1

## 2021-06-21 NOTE — Progress Notes (Signed)
PROGRESS NOTE    Jimmy West  JFH:[id number] DOB: January 02, 1951 DOA: 06/06/2021 PCP: Pcp, No    Brief Narrative:  70 year old man with stage IV lung cancer followed by hospice, chronic pain and history of PE.  He presented to the hospital with acute on chronic hypoxic hypercapnic respiratory failure initially requiring BiPAP and currently on 10 L high flow nasal cannula.  Also severe sepsis with COVID-19 and possible bacterial pneumonia.  Patient had elevated troponin likely demand ischemia from acute respiratory distress but could also be NSTEMI.  Initially the patient was thinking about comfort care measures but since the patient is mentating he did want treatment for pneumonia and COVID-19.   Assessment & Plan:   Principal Problem:   Acute on chronic respiratory failure with hypoxia and hypercapnia (HCC) Active Problems:   Primary cancer of right lower lobe of lung (HCC)   Cancer-related pain   Palliative care encounter   Depression   Chronic anticoagulation   NSTEMI (non-ST elevated myocardial infarction) ([place])   HCAP (healthcare-associated pneumonia)   Severe protein-calorie malnutrition (HCC)   Pneumonia due to COVID-19 virus   Severe sepsis ([place])   Acute metabolic encephalopathy   Malnutrition of moderate degree  Acute on chronic hypoxic hypercapnic respiratory failure secondary to COVID-19 pneumonia.  Initially with EMS pulse ox was 70%.  The patient chronically wears 7 to 8 L of oxygen.  Slowly being weaned down.  As of 12/2 patient on 7 L Severe sepsis, present on admission with acute metabolic encephalopathy and COVID-19 pneumonia and possible bacterial pneumonia.  Has completed 5-day course of Solu-Medrol, remdesivir, Rocephin and doxycycline.  No further antibiotics.  Remain DNR and comfort measures status.  Ensure pain control Acute metabolic encephalopathy.  This is much improved.  Had elevated CO2 on presentation.  Baseline level of mentation Stage IV lung  cancer.  Patient is a DO NOT RESUSCITATE and follows with hospice.  Ensure pain control while admitted.  Patient appears appropriate for an inpatient hospice discharge.  He is requiring frequent doses of IV narcotics.  Will need to remain in isolation until 12/6 Elevated troponin and suspected NSTEMI but also could be elevated from demand ischemia with acute respiratory distress.  No further work-up with his stage IV lung cancer.  No complaints of chest pain. Chronic pain on oxycodone History of PE on Eliquis Severe protein calorie malnutrition     DVT prophylaxis: Eliquis Code Status: DNR Family Communication: None today Disposition Plan: Status is: Inpatient  Remains inpatient appropriate because: Patient active with hospice, still treating treatable disease.  Completed 5-day course of empiric antibiotics and remdesivir.  Comfort measures, focus on pain control.  Patient appears appropriate for inpatient hospice disposition.  Due to COVID isolation he will need to be reevaluated on 12/6 for appropriateness for hospice home.       Level of care: Med-Surg  Consultants:  None  Procedures:  None  Antimicrobials: Rocephin Azithromycin   Subjective: Patient seen and examined.  Resting in bed.  Still endorses pain despite increasing doses of narcotics  Objective: Vitals:   06/20/21 0841 06/20/21 1603 06/20/21 2136 06/20/21 2300  BP: 99/69 94/66 104/73   Pulse: (!) 105 98 (!) 104   Resp: 18 19 20    Temp: 97.9 F (36.6 C) 98.5 F (36.9 C) 98.3 F (36.8 C)   TempSrc: Oral Oral Oral   SpO2: 95% 95% 99% 99%  Weight:      Height:        Intake/Output  Summary (Last 24 hours) at 06/21/2021 1114 Last data filed at 06/21/2021 0900 Gross per 24 hour  Intake 240 ml  Output 1275 ml  Net -1035 ml   Filed Weights   06/05/2021 2023 06/17/21 0256  Weight: 60.1 kg 59.4 kg    Examination:  General exam: No apparent distress.  Frail and fatigued.  Chronically ill Respiratory  system: Appears fatigued.  Scattered crackles.  Normal work of breathing.  7 L Cardiovascular system: S1-S2, RRR, no murmurs, no pedal edema Gastrointestinal system: Soft, NT/ND, normal bowel sounds Central nervous system: Alert and oriented. No focal neurological deficits. Extremities: Symmetric 5 x 5 power. Skin: No rashes, lesions or ulcers Psychiatry: Judgement and insight appear normal. Mood & affect appropriate.     Data Reviewed: I have personally reviewed following labs and imaging studies  CBC: Recent Labs  Lab 06/07/2021 2122 06/19/21 0517  WBC 23.1* 9.5  NEUTROABS 19.7*  --   HGB 13.1 8.9*  HCT 43.6 28.7*  MCV 101.6* 98.3  PLT 309 001   Basic Metabolic Panel: Recent Labs  Lab 05/30/2021 2122 06/19/21 0517  NA 141 139  K 3.8 4.7  CL 96* 100  CO2 36* 35*  GLUCOSE 138* 122*  BUN 27* 38*  CREATININE 1.03 0.95  CALCIUM 9.5 8.5*   GFR: Estimated Creatinine Clearance: 59.9 mL/min (by C-G formula based on SCr of 0.95 mg/dL). Liver Function Tests: Recent Labs  Lab 06/07/2021 2122  AST 23  ALT 25  ALKPHOS 108  BILITOT 1.2  PROT 7.1  ALBUMIN 3.3*   Recent Labs  Lab 05/25/2021 2122  LIPASE 33   No results for input(s): AMMONIA in the last 168 hours. Coagulation Profile: Recent Labs  Lab 06/15/21 0230  INR 1.1   Cardiac Enzymes: No results for input(s): CKTOTAL, CKMB, CKMBINDEX, TROPONINI in the last 168 hours. BNP (last 3 results) No results for input(s): PROBNP in the last 8760 hours. HbA1C: No results for input(s): HGBA1C in the last 72 hours. CBG: No results for input(s): GLUCAP in the last 168 hours. Lipid Profile: No results for input(s): CHOL, HDL, LDLCALC, TRIG, CHOLHDL, LDLDIRECT in the last 72 hours. Thyroid Function Tests: No results for input(s): TSH, T4TOTAL, FREET4, T3FREE, THYROIDAB in the last 72 hours. Anemia Panel: No results for input(s): VITAMINB12, FOLATE, FERRITIN, TIBC, IRON, RETICCTPCT in the last 72 hours. Sepsis  Labs: Recent Labs  Lab 06/05/2021 2122 06/15/21 0623 06/15/21 0630  PROCALCITON 1.04 22.44  --   LATICACIDVEN 1.7  --  1.3    Recent Results (from the past 240 hour(s))  Blood culture (routine x 2)     Status: None   Collection Time: 06/11/2021  9:22 PM   Specimen: BLOOD  Result Value Ref Range Status   Specimen Description BLOOD RIGHT ANTECUBITAL  Final   Special Requests   Final    BOTTLES DRAWN AEROBIC AND ANAEROBIC Blood Culture adequate volume   Culture   Final    NO GROWTH 5 DAYS Performed at [facility], [address] [place], [place] [address]    Report Status 06/19/2021 FINAL  Final  Resp Panel by RT-PCR (Flu A&B, Covid) Nasopharyngeal Swab     Status: Abnormal   Collection Time: 05/25/2021 11:07 PM   Specimen: Nasopharyngeal Swab; Nasopharyngeal(NP) swabs in vial transport medium  Result Value Ref Range Status   SARS Coronavirus 2 by RT PCR POSITIVE (A) NEGATIVE Final    Comment: RESULT CALLED TO, READ BACK BY AND VERIFIED WITH: MELISSA HOUP  0112 06/15/21 LFD (NOTE) SARS-CoV-2 target nucleic acids are DETECTED.  The SARS-CoV-2 RNA is generally detectable in upper respiratory specimens during the acute phase of infection. Positive results are indicative of the presence of the identified virus, but do not rule out bacterial infection or co-infection with other pathogens not detected by the test. Clinical correlation with patient history and other diagnostic information is necessary to determine patient infection status. The expected result is Negative.  Fact Sheet for Patients: EntrepreneurPulse.com.au  Fact Sheet for Healthcare Providers: IncredibleEmployment.be  This test is not yet approved or cleared by the [place] FDA and  has been authorized for detection and/or diagnosis of SARS-CoV-2 by FDA under an Emergency Use Authorization (EUA).  This EUA will remain in effect (meaning this test can be [place] ed) for  the duration of  the COVID-19 declaration under Section 564(b)(1) of the Act, 21 U.S.C. section 360bbb-3(b)(1), unless the authorization is terminated or revoked sooner.     Influenza A by PCR NEGATIVE NEGATIVE Final   Influenza B by PCR NEGATIVE NEGATIVE Final    Comment: (NOTE) The Xpert Xpress SARS-CoV-2/FLU/RSV plus assay is intended as an aid in the diagnosis of influenza from Nasopharyngeal swab specimens and should not be used as a sole basis for treatment. Nasal washings and aspirates are unacceptable for Xpert Xpress SARS-CoV-2/FLU/RSV testing.  Fact Sheet for Patients: EntrepreneurPulse.com.au  Fact Sheet for Healthcare Providers: IncredibleEmployment.be  This test is not yet approved or cleared by the [place] FDA and has been authorized for detection and/or diagnosis of SARS-CoV-2 by FDA under an Emergency Use Authorization (EUA). This EUA will remain in effect (meaning this test can be used) for the duration of the COVID-19 declaration under Section 564(b)(1) of the Act, 21 U.S.C. section 360bbb-3(b)(1), unless the authorization is terminated or revoked.  Performed at [facility], [address] [place], [place] [address]   Blood culture (routine x 2)     Status: None   Collection Time: 06/15/21  6:23 AM   Specimen: BLOOD  Result Value Ref Range Status   Specimen Description BLOOD LFA  Final   Special Requests   Final    BOTTLES DRAWN AEROBIC AND ANAEROBIC Blood Culture adequate volume   Culture   Final    NO GROWTH 5 DAYS Performed at [facility], [address], [place], [place] [address]    Report Status 06/20/2021 FINAL  Final         Radiology Studies: No results found.      Scheduled Meds:  apixaban  5 mg Oral BID   Chlorhexidine Gluconate Cloth  6 each Topical Daily   feeding supplement  237 mL Oral BID BM   FLUoxetine  40 mg Oral Daily   OLANZapine  10 mg Oral QHS    oxyCODONE  10 mg Oral Q12H   sodium chloride flush  10-40 mL Intracatheter Q12H   Continuous Infusions:     LOS: 7 days    Time spent: 25 minutes    [place] Ace, MD [facility]   If 7PM-7AM, please contact night-coverage  06/21/2021, 11:14 AM

## 2021-06-21 NOTE — TOC Progression Note (Signed)
Transition of Care [facility]) - Progression Note    Patient Details  Name: Jimmy West MRN: [id number] Date of Birth: 05-Sep-1950  Transition of [facility]) CM/SW [place], RN Phone Number: 06/21/2021, 2:40 PM  Clinical Narrative:   Patient's family at bedside, requested information about Hospice House.  Sonia Baller from [place] spoke with family by phone.    Patient is on covid isolation currently, due to end on 12/6.  At that time, [facility] will evaluate for hospice house.  TOC contact information provided, TOC to follow for discharge.    Expected Discharge Plan: Home w Hospice Care Barriers to Discharge: Continued Medical Work up  Expected Discharge Plan and Services Expected Discharge Plan: [place]   Discharge Planning Services: CM Consult Post Acute Care Choice: Hospice Living arrangements for the past 2 months: Single Family Home                                       Social Determinants of Health (SDOH) Interventions    Readmission Risk Interventions Readmission Risk Prevention Plan 06/15/2021 04/26/2021  Transportation Screening Complete Complete  PCP or Specialist Appt within 3-5 Days - Complete  HRI or [place] - Complete  Social Work Consult for [place] Planning/Counseling - Complete  Palliative Care Screening - Not Applicable  Medication Review Press photographer) Referral to Pharmacy Complete  PCP or Specialist appointment within 3-5 days of discharge Complete -  [place] or Home Care Consult Complete -  SW Recovery Care/Counseling Consult Complete -  Palliative Care Screening Complete -  [place] Not Applicable -  Some recent data might be hidden

## 2021-06-21 NOTE — Progress Notes (Signed)
[facility]) Hospitalized Hospice Patient Visit   Jimmy West is a current hospice patient with a terminal diagnosis of malignant neoplasm of lower lobe, right bronchus or lung. Patient was admitted to hospice services on Friday, 11.25.22. Later in the evening of 11.25.22 patient's O2 sats dropped into the 60-70s. Patient's friend [name] decided to call 911 and have patient transported to [facility] for evaluation. Patient was admitted with diagnosis of pneumonia and Covid. Per Dr. Gilford Rile with [facility], this is a related hospital admission.    Unable to go into patient's room due to Covid isolation. Spoke with patient's nurse. Per nurse, patient is now on 7L O2. Patient experienced some anxiety during the night again. Ativan relieved anxiety and patient was able to sleep a few hours per nurse. Phone call to update friend [name]. Also spoke with patient's brother and sister-in-law by phone. Answered questions related to Hospice Home. Notified that [facility] will evaluate patient for Hospice Home eligibility once Covid isolation has ended on 12.6.22. Hospital liaison will continue to follow for discharge disposition.   Patient remains inpatient appropriate in order to receive IV antibiotics to treat pneumonia and IV pain medication for symptom management.   V/S: 98.3 oral, 104/73, 104, 20, 99% on 7L via HFNC   I/O:  240/1500   Abnormal Labs: None   Diagnostics: None    IV/PRN:  Rocephin 2 gm IV daily, Dilaudid 1.5 mg IV x 1.   Problem List: Principal Problem:   Acute on chronic respiratory failure with hypoxia and hypercapnia (HCC) Active Problems:   Primary cancer of right lower lobe of lung (HCC)   Cancer-related pain   Palliative care encounter   Depression   Chronic anticoagulation   NSTEMI (non-ST elevated myocardial infarction) ([place])   HCAP (healthcare-associated pneumonia)   Severe protein-calorie malnutrition ([place])   Pneumonia due to COVID-19  virus   Severe sepsis ([place])   Acute metabolic encephalopathy   Malnutrition of moderate degree   Acute on chronic hypoxic hypercapnic respiratory failure secondary to COVID-19 pneumonia.  Initially with EMS pulse ox was 70%.  The patient chronically wears 7 to 8 L of oxygen.  Slowly being weaned down.  As of 12/2 patient on 7 L Severe sepsis, present on admission with acute metabolic encephalopathy and COVID-19 pneumonia and possible bacterial pneumonia.  Has completed 5-day course of Solu-Medrol, remdesivir, Rocephin and doxycycline.  No further antibiotics.  Remain DNR and comfort measures status.  Ensure pain control Acute metabolic encephalopathy.  This is much improved.  Had elevated CO2 on presentation.  Baseline level of mentation Stage IV lung cancer.  Patient is a DO NOT RESUSCITATE and follows with hospice.  Ensure pain control while admitted.  Patient appears appropriate for an inpatient hospice discharge.  He is requiring frequent doses of IV narcotics.  Will need to remain in isolation until 12/6 Elevated troponin and suspected NSTEMI but also could be elevated from demand ischemia with acute respiratory distress.  No further work-up with his stage IV lung cancer.  No complaints of chest pain. Chronic pain on oxycodone History of PE on Eliquis Severe protein calorie malnutrition Discharge Planning: Ongoing. Plan to discharge home with hospice services, if stable or discharge to [facility], if eligible.   Family Contact: Phone call to update friend [name]. Spoke with brother and sister-in-law by phone.    IDT: Updated   Goals of Care: Clear, treat the treatable.    Please do not hesitate to call with any  hospice related questions or concerns.    Thank you,    Bobbie "Loren Racer, [place], BSN [facility] Liaison [phone]

## 2021-06-22 DIAGNOSIS — J9621 Acute and chronic respiratory failure with hypoxia: Secondary | ICD-10-CM | POA: Diagnosis not present

## 2021-06-22 DIAGNOSIS — J9622 Acute and chronic respiratory failure with hypercapnia: Secondary | ICD-10-CM | POA: Diagnosis not present

## 2021-06-22 NOTE — Progress Notes (Signed)
PROGRESS NOTE    Jimmy West  MWU:[id number] DOB: [birth year] DOA: 06/08/2021 PCP: Pcp, No    Brief Narrative:  70 year old man with stage IV lung cancer followed by hospice, chronic pain and history of PE.  He presented to the hospital with acute on chronic hypoxic hypercapnic respiratory failure initially requiring BiPAP and currently on 10 L high flow nasal cannula.  Also severe sepsis with COVID-19 and possible bacterial pneumonia.  Patient had elevated troponin likely demand ischemia from acute respiratory distress but could also be NSTEMI.  Initially the patient was thinking about comfort care measures but since the patient is mentating he did want treatment for pneumonia and COVID-19.   Assessment & Plan:   Principal Problem:   Acute on chronic respiratory failure with hypoxia and hypercapnia (HCC) Active Problems:   Primary cancer of right lower lobe of lung (HCC)   Cancer-related pain   Palliative care encounter   Depression   Chronic anticoagulation   NSTEMI (non-ST elevated myocardial infarction) ([place])   HCAP (healthcare-associated pneumonia)   Severe protein-calorie malnutrition (HCC)   Pneumonia due to COVID-19 virus   Severe sepsis ([place])   Acute metabolic encephalopathy   Malnutrition of moderate degree  Acute on chronic hypoxic hypercapnic respiratory failure secondary to COVID-19 pneumonia.  Initially with EMS pulse ox was 70%.  The patient chronically wears 7 to 8 L of oxygen.  Slowly being weaned down.  As of 12/2 patient on 7 L Severe sepsis, present on admission with acute metabolic encephalopathy and COVID-19 pneumonia and possible bacterial pneumonia.  Has completed 5-day course of Solu-Medrol, remdesivir, Rocephin and doxycycline.  No further antibiotics.  Remain DNR and comfort measures status.  Ensure pain control Acute metabolic encephalopathy.  This is much improved.  Had elevated CO2 on presentation.  Baseline level of mentation Stage IV lung  cancer.  Patient is a DO NOT RESUSCITATE and follows with hospice.  Ensure pain control while admitted.  Patient appears appropriate for an inpatient hospice discharge.  He is requiring frequent doses of IV narcotics.  Will need to remain in isolation until 12/6 Elevated troponin and suspected NSTEMI but also could be elevated from demand ischemia with acute respiratory distress.  No further work-up with his stage IV lung cancer.  No complaints of chest pain. Chronic pain.  Currently on oxycodone immediate release 20 mg every 4 hours as needed, Dilaudid 1.5 mg IV every 3 hours as needed, OxyContin 10 mg every 12 hours History of PE on Eliquis Severe protein calorie malnutrition     DVT prophylaxis: Eliquis Code Status: DNR Family Communication: Samuel Germany [phone] on 12/30 Disposition Plan: Status is: Inpatient  Remains inpatient appropriate because: Patient active with hospice, still treating treatable disease.  Completed 5-day course of empiric antibiotics and remdesivir.  Comfort measures, focus on pain control.  Patient appears appropriate for inpatient hospice disposition.  Due to COVID isolation he will need to be reevaluated on 12/6 for appropriateness for hospice home.       Level of care: Med-Surg  Consultants:  None  Procedures:  None  Antimicrobials: Rocephin Azithromycin   Subjective: Patient seen and examined.  Resting in bed.  Improved pain control over the past day Objective: Vitals:   06/20/21 2300 06/21/21 2131 06/22/21 0503 06/22/21 0805  BP:  109/78 91/68 103/73  Pulse:  (!) 102 100 (!) 103  Resp:  14 14 18   Temp:  (!) 97.5 F (36.4 C) (!) 97.5 F (36.4 C) 98.7 F (37.1  C)  TempSrc:  Oral Oral   SpO2: 99% 97% 99% 98%  Weight:      Height:        Intake/Output Summary (Last 24 hours) at 06/22/2021 1054 Last data filed at 06/22/2021 0300 Gross per 24 hour  Intake 240 ml  Output 650 ml  Net -410 ml   Filed Weights   06/08/2021 2023  06/17/21 0256  Weight: 60.1 kg 59.4 kg    Examination:  General exam: No apparent distress.  Frail and fatigued.  Chronically ill Respiratory system: Appears fatigued.  Scattered crackles.  Normal work of breathing.  7 L Cardiovascular system: S1-S2, RRR, no murmurs, no pedal edema Gastrointestinal system: Soft, NT/ND, normal bowel sounds Central nervous system: Alert and oriented. No focal neurological deficits. Extremities: Symmetric 5 x 5 power. Skin: No rashes, lesions or ulcers Psychiatry: Judgement and insight appear normal. Mood & affect appropriate.     Data Reviewed: I have personally reviewed following labs and imaging studies  CBC: Recent Labs  Lab 06/19/21 0517  WBC 9.5  HGB 8.9*  HCT 28.7*  MCV 98.3  PLT 151   Basic Metabolic Panel: Recent Labs  Lab 06/19/21 0517  NA 139  K 4.7  CL 100  CO2 35*  GLUCOSE 122*  BUN 38*  CREATININE 0.95  CALCIUM 8.5*   GFR: Estimated Creatinine Clearance: 59.9 mL/min (by C-G formula based on SCr of 0.95 mg/dL). Liver Function Tests: No results for input(s): AST, ALT, ALKPHOS, BILITOT, PROT, ALBUMIN in the last 168 hours.  No results for input(s): LIPASE, AMYLASE in the last 168 hours.  No results for input(s): AMMONIA in the last 168 hours. Coagulation Profile: No results for input(s): INR, PROTIME in the last 168 hours.  Cardiac Enzymes: No results for input(s): CKTOTAL, CKMB, CKMBINDEX, TROPONINI in the last 168 hours. BNP (last 3 results) No results for input(s): PROBNP in the last 8760 hours. HbA1C: No results for input(s): HGBA1C in the last 72 hours. CBG: No results for input(s): GLUCAP in the last 168 hours. Lipid Profile: No results for input(s): CHOL, HDL, LDLCALC, TRIG, CHOLHDL, LDLDIRECT in the last 72 hours. Thyroid Function Tests: No results for input(s): TSH, T4TOTAL, FREET4, T3FREE, THYROIDAB in the last 72 hours. Anemia Panel: No results for input(s): VITAMINB12, FOLATE, FERRITIN, TIBC, IRON,  RETICCTPCT in the last 72 hours. Sepsis Labs: No results for input(s): PROCALCITON, LATICACIDVEN in the last 168 hours.   Recent Results (from the past 240 hour(s))  Blood culture (routine x 2)     Status: None   Collection Time: 06/15/2021  9:22 PM   Specimen: BLOOD  Result Value Ref Range Status   Specimen Description BLOOD RIGHT ANTECUBITAL  Final   Special Requests   Final    BOTTLES DRAWN AEROBIC AND ANAEROBIC Blood Culture adequate volume   Culture   Final    NO GROWTH 5 DAYS Performed at [facility], [address] [place], [place] [address]    Report Status 06/19/2021 FINAL  Final  Resp Panel by RT-PCR (Flu A&B, Covid) Nasopharyngeal Swab     Status: Abnormal   Collection Time: 06/13/2021 11:07 PM   Specimen: Nasopharyngeal Swab; Nasopharyngeal(NP) swabs in vial transport medium  Result Value Ref Range Status   SARS Coronavirus 2 by RT PCR POSITIVE (A) NEGATIVE Final    Comment: RESULT CALLED TO, READ BACK BY AND VERIFIED WITH: MELISSA HOUP 0112 06/15/21 LFD (NOTE) SARS-CoV-2 target nucleic acids are DETECTED.  The SARS-CoV-2 RNA is generally  detectable in upper respiratory specimens during the acute phase of infection. Positive results are indicative of the presence of the identified virus, but do not rule out bacterial infection or co-infection with other pathogens not detected by the test. Clinical correlation with patient history and other diagnostic information is necessary to determine patient infection status. The expected result is Negative.  Fact Sheet for Patients: EntrepreneurPulse.com.au  Fact Sheet for Healthcare Providers: IncredibleEmployment.be  This test is not yet approved or cleared by the [place] FDA and  has been authorized for detection and/or diagnosis of SARS-CoV-2 by FDA under an Emergency Use Authorization (EUA).  This EUA will remain in effect (meaning this test can be [place] ed) for the  duration of  the COVID-19 declaration under Section 564(b)(1) of the Act, 21 U.S.C. section 360bbb-3(b)(1), unless the authorization is terminated or revoked sooner.     Influenza A by PCR NEGATIVE NEGATIVE Final   Influenza B by PCR NEGATIVE NEGATIVE Final    Comment: (NOTE) The Xpert Xpress SARS-CoV-2/FLU/RSV plus assay is intended as an aid in the diagnosis of influenza from Nasopharyngeal swab specimens and should not be used as a sole basis for treatment. Nasal washings and aspirates are unacceptable for Xpert Xpress SARS-CoV-2/FLU/RSV testing.  Fact Sheet for Patients: EntrepreneurPulse.com.au  Fact Sheet for Healthcare Providers: IncredibleEmployment.be  This test is not yet approved or cleared by the [place] FDA and has been authorized for detection and/or diagnosis of SARS-CoV-2 by FDA under an Emergency Use Authorization (EUA). This EUA will remain in effect (meaning this test can be used) for the duration of the COVID-19 declaration under Section 564(b)(1) of the Act, 21 U.S.C. section 360bbb-3(b)(1), unless the authorization is terminated or revoked.  Performed at [facility], [address] [place], [place] [address]   Blood culture (routine x 2)     Status: None   Collection Time: 06/15/21  6:23 AM   Specimen: BLOOD  Result Value Ref Range Status   Specimen Description BLOOD LFA  Final   Special Requests   Final    BOTTLES DRAWN AEROBIC AND ANAEROBIC Blood Culture adequate volume   Culture   Final    NO GROWTH 5 DAYS Performed at [facility], [address] [place], [place] [address]    Report Status 06/20/2021 FINAL  Final         Radiology Studies: No results found.      Scheduled Meds:  apixaban  5 mg Oral BID   Chlorhexidine Gluconate Cloth  6 each Topical Daily   feeding supplement  237 mL Oral BID BM   FLUoxetine  40 mg Oral Daily   OLANZapine  10 mg Oral QHS   oxyCODONE   10 mg Oral Q12H   sodium chloride flush  10-40 mL Intracatheter Q12H   Continuous Infusions:     LOS: 8 days    Time spent: 15 minutes    Sidney Ace, MD [facility]   If 7PM-7AM, please contact night-coverage  06/22/2021, 10:54 AM

## 2021-06-22 NOTE — Plan of Care (Signed)
  Problem: Nutrition: Goal: Adequate nutrition will be maintained Outcome: Progressing   

## 2021-06-23 DIAGNOSIS — J9621 Acute and chronic respiratory failure with hypoxia: Secondary | ICD-10-CM | POA: Diagnosis not present

## 2021-06-23 DIAGNOSIS — J9622 Acute and chronic respiratory failure with hypercapnia: Secondary | ICD-10-CM | POA: Diagnosis not present

## 2021-06-23 NOTE — Plan of Care (Signed)

## 2021-06-23 NOTE — Progress Notes (Signed)
PROGRESS NOTE    Jimmy West  ZHG:[id number] DOB: 05/23/1951 DOA: 05/23/2021 PCP: Pcp, No    Brief Narrative:  70 year old man with stage IV lung cancer followed by hospice, chronic pain and history of PE.  He presented to the hospital with acute on chronic hypoxic hypercapnic respiratory failure initially requiring BiPAP and currently on 10 L high flow nasal cannula.  Also severe sepsis with COVID-19 and possible bacterial pneumonia.  Patient had elevated troponin likely demand ischemia from acute respiratory distress but could also be NSTEMI.  Initially the patient was thinking about comfort care measures but since the patient is mentating he did want treatment for pneumonia and COVID-19.   Assessment & Plan:   Principal Problem:   Acute on chronic respiratory failure with hypoxia and hypercapnia (HCC) Active Problems:   Primary cancer of right lower lobe of lung (HCC)   Cancer-related pain   Palliative care encounter   Depression   Chronic anticoagulation   NSTEMI (non-ST elevated myocardial infarction) ([place])   HCAP (healthcare-associated pneumonia)   Severe protein-calorie malnutrition (HCC)   Pneumonia due to COVID-19 virus   Severe sepsis ([place])   Acute metabolic encephalopathy   Malnutrition of moderate degree  Acute on chronic hypoxic hypercapnic respiratory failure secondary to COVID-19 pneumonia.  Initially with EMS pulse ox was 70%.  The patient chronically wears 7 to 8 L of oxygen.  Slowly being weaned down.  As of 12/2 patient on 7 L Severe sepsis, present on admission with acute metabolic encephalopathy and COVID-19 pneumonia and possible bacterial pneumonia.  Has completed 5-day course of Solu-Medrol, remdesivir, Rocephin and doxycycline.  No further antibiotics.  Remain DNR and comfort measures status.  Ensure pain control Acute metabolic encephalopathy.  This is much improved.  Had elevated CO2 on presentation.  Baseline level of mentation Stage IV lung  cancer.  Patient is a DO NOT RESUSCITATE and follows with hospice.  Ensure pain control while admitted.  Patient appears appropriate for an inpatient hospice discharge.  He is requiring frequent doses of IV narcotics.  Will need to remain in isolation until 12/6 Elevated troponin and suspected NSTEMI but also could be elevated from demand ischemia with acute respiratory distress.  No further work-up with his stage IV lung cancer.  No complaints of chest pain. Chronic pain.  Currently on oxycodone immediate release 20 mg every 4 hours as needed, Dilaudid 1.5 mg IV every 3 hours as needed, OxyContin 10 mg every 12 hours.  Current regimen effective for now. History of PE on Eliquis Severe protein calorie malnutrition     DVT prophylaxis: Eliquis Code Status: DNR Family Communication: Samuel Germany [phone] on 12/3 Disposition Plan: Status is: Inpatient  Remains inpatient appropriate because: Patient active with hospice, still treating treatable disease.  Completed 5-day course of empiric antibiotics and remdesivir.  Comfort measures, focus on pain control.  Patient appears appropriate for inpatient hospice disposition.  Due to COVID isolation he will need to be reevaluated on 12/6 for appropriateness for hospice home.       Level of care: Med-Surg  Consultants:  None  Procedures:  None  Antimicrobials: Rocephin Azithromycin   Subjective: Patient seen and examined.  Resting in bed.  Improved pain control over the past day Objective: Vitals:   06/22/21 1602 06/22/21 2130 06/23/21 0607 06/23/21 0748  BP: 113/65 102/67 (!) 114/92 115/70  Pulse: (!) 112 (!) 108 97 96  Resp: 19 (!) 24 14 18   Temp: 98.7 F (37.1 C) 98.9 F (  37.2 C) 98.2 F (36.8 C) 98.3 F (36.8 C)  TempSrc: Oral Oral Oral Oral  SpO2: 96% 98% 98% 100%  Weight:      Height:        Intake/Output Summary (Last 24 hours) at 06/23/2021 1101 Last data filed at 06/23/2021 1000 Gross per 24 hour  Intake --   Output 1200 ml  Net -1200 ml   Filed Weights   05/31/2021 2023 06/17/21 0256  Weight: 60.1 kg 59.4 kg    Examination:  General exam: No apparent distress.  Frail and fatigued.  Chronically ill Respiratory system: Appears fatigued.  Scattered crackles.  Normal work of breathing.  7 L Cardiovascular system: S1-S2, RRR, no murmurs, no pedal edema Gastrointestinal system: Soft, NT/ND, normal bowel sounds Central nervous system: Alert and oriented. No focal neurological deficits. Extremities: Symmetric 5 x 5 power. Skin: No rashes, lesions or ulcers Psychiatry: Judgement and insight appear normal. Mood & affect appropriate.     Data Reviewed: I have personally reviewed following labs and imaging studies  CBC: Recent Labs  Lab 06/19/21 0517  WBC 9.5  HGB 8.9*  HCT 28.7*  MCV 98.3  PLT 656   Basic Metabolic Panel: Recent Labs  Lab 06/19/21 0517  NA 139  K 4.7  CL 100  CO2 35*  GLUCOSE 122*  BUN 38*  CREATININE 0.95  CALCIUM 8.5*   GFR: Estimated Creatinine Clearance: 59.9 mL/min (by C-G formula based on SCr of 0.95 mg/dL). Liver Function Tests: No results for input(s): AST, ALT, ALKPHOS, BILITOT, PROT, ALBUMIN in the last 168 hours.  No results for input(s): LIPASE, AMYLASE in the last 168 hours.  No results for input(s): AMMONIA in the last 168 hours. Coagulation Profile: No results for input(s): INR, PROTIME in the last 168 hours.  Cardiac Enzymes: No results for input(s): CKTOTAL, CKMB, CKMBINDEX, TROPONINI in the last 168 hours. BNP (last 3 results) No results for input(s): PROBNP in the last 8760 hours. HbA1C: No results for input(s): HGBA1C in the last 72 hours. CBG: No results for input(s): GLUCAP in the last 168 hours. Lipid Profile: No results for input(s): CHOL, HDL, LDLCALC, TRIG, CHOLHDL, LDLDIRECT in the last 72 hours. Thyroid Function Tests: No results for input(s): TSH, T4TOTAL, FREET4, T3FREE, THYROIDAB in the last 72 hours. Anemia  Panel: No results for input(s): VITAMINB12, FOLATE, FERRITIN, TIBC, IRON, RETICCTPCT in the last 72 hours. Sepsis Labs: No results for input(s): PROCALCITON, LATICACIDVEN in the last 168 hours.   Recent Results (from the past 240 hour(s))  Blood culture (routine x 2)     Status: None   Collection Time: 06/05/2021  9:22 PM   Specimen: BLOOD  Result Value Ref Range Status   Specimen Description BLOOD RIGHT ANTECUBITAL  Final   Special Requests   Final    BOTTLES DRAWN AEROBIC AND ANAEROBIC Blood Culture adequate volume   Culture   Final    NO GROWTH 5 DAYS Performed at [facility], [address] [place], [place] [address]    Report Status 06/19/2021 FINAL  Final  Resp Panel by RT-PCR (Flu A&B, Covid) Nasopharyngeal Swab     Status: Abnormal   Collection Time: 06/19/2021 11:07 PM   Specimen: Nasopharyngeal Swab; Nasopharyngeal(NP) swabs in vial transport medium  Result Value Ref Range Status   SARS Coronavirus 2 by RT PCR POSITIVE (A) NEGATIVE Final    Comment: RESULT CALLED TO, READ BACK BY AND VERIFIED WITH: MELISSA HOUP 0112 06/15/21 LFD (NOTE) SARS-CoV-2 target nucleic acids  are DETECTED.  The SARS-CoV-2 RNA is generally detectable in upper respiratory specimens during the acute phase of infection. Positive results are indicative of the presence of the identified virus, but do not rule out bacterial infection or co-infection with other pathogens not detected by the test. Clinical correlation with patient history and other diagnostic information is necessary to determine patient infection status. The expected result is Negative.  Fact Sheet for Patients: EntrepreneurPulse.com.au  Fact Sheet for Healthcare Providers: IncredibleEmployment.be  This test is not yet approved or cleared by the [place] FDA and  has been authorized for detection and/or diagnosis of SARS-CoV-2 by FDA under an Emergency Use Authorization (EUA).   This EUA will remain in effect (meaning this test can be [place] ed) for the duration of  the COVID-19 declaration under Section 564(b)(1) of the Act, 21 U.S.C. section 360bbb-3(b)(1), unless the authorization is terminated or revoked sooner.     Influenza A by PCR NEGATIVE NEGATIVE Final   Influenza B by PCR NEGATIVE NEGATIVE Final    Comment: (NOTE) The Xpert Xpress SARS-CoV-2/FLU/RSV plus assay is intended as an aid in the diagnosis of influenza from Nasopharyngeal swab specimens and should not be used as a sole basis for treatment. Nasal washings and aspirates are unacceptable for Xpert Xpress SARS-CoV-2/FLU/RSV testing.  Fact Sheet for Patients: EntrepreneurPulse.com.au  Fact Sheet for Healthcare Providers: IncredibleEmployment.be  This test is not yet approved or cleared by the [place] FDA and has been authorized for detection and/or diagnosis of SARS-CoV-2 by FDA under an Emergency Use Authorization (EUA). This EUA will remain in effect (meaning this test can be used) for the duration of the COVID-19 declaration under Section 564(b)(1) of the Act, 21 U.S.C. section 360bbb-3(b)(1), unless the authorization is terminated or revoked.  Performed at [facility], [address] [place], [place] [address]   Blood culture (routine x 2)     Status: None   Collection Time: 06/15/21  6:23 AM   Specimen: BLOOD  Result Value Ref Range Status   Specimen Description BLOOD LFA  Final   Special Requests   Final    BOTTLES DRAWN AEROBIC AND ANAEROBIC Blood Culture adequate volume   Culture   Final    NO GROWTH 5 DAYS Performed at [facility], [address] [place], [place] [address]    Report Status 06/20/2021 FINAL  Final         Radiology Studies: No results found.      Scheduled Meds:  apixaban  5 mg Oral BID   Chlorhexidine Gluconate Cloth  6 each Topical Daily   feeding supplement  237 mL Oral BID BM    FLUoxetine  40 mg Oral Daily   OLANZapine  10 mg Oral QHS   oxyCODONE  10 mg Oral Q12H   sodium chloride flush  10-40 mL Intracatheter Q12H   Continuous Infusions:     LOS: 9 days    Time spent: 15 minutes    Sidney Ace, MD [facility]   If 7PM-7AM, please contact night-coverage  06/23/2021, 11:01 AM

## 2021-06-24 DIAGNOSIS — J9622 Acute and chronic respiratory failure with hypercapnia: Secondary | ICD-10-CM | POA: Diagnosis not present

## 2021-06-24 DIAGNOSIS — J9621 Acute and chronic respiratory failure with hypoxia: Secondary | ICD-10-CM | POA: Diagnosis not present

## 2021-07-02 ENCOUNTER — Inpatient Hospital Stay: Payer: Medicare HMO | Admitting: Adult Health

## 2021-07-19 ENCOUNTER — Ambulatory Visit: Payer: Medicare HMO | Admitting: Radiation Oncology

## 2021-07-21 NOTE — Progress Notes (Signed)
[facility]) Hospitalized Hospice Patient Visit   Jimmy West is a current hospice patient with a terminal diagnosis of malignant neoplasm of lower lobe, right bronchus or lung. Patient was admitted to hospice services on Friday, 11.25.22. Later in the evening of 11.25.22 patient's O2 sats dropped into the 60-70s. Patient's friend Jimmy West decided to call 911 and have patient transported to [facility] for evaluation. Patient was admitted with diagnosis of pneumonia and Covid. Per Dr. Gilford Rile with [facility], this is a related hospital admission.    Unable to go into patient's room due to Covid isolation. Spoke with patient's nurse. Per nurse, patient did not have a very good night. Patient experienced some anxiety and agitation during the night. Jimmy West stayed the night with patient. This AM patient seemed confused per hospital care team. Spoke with Jimmy West by phone for update. Discussed that hospital liaison will visit patient at bedside once Covid isolation has ended. Hospital liaison will continue to follow for discharge disposition.   Patient remains inpatient appropriate in order to receive IV medication for symptom management and to monitor effectiveness of medications provided to treat pneumonia and Covid.   V/S: 98 oral, 104/70, 103, 18, 97% on 7L via HFNC   I/O:  60/1100   Abnormal Labs: None   Diagnostics: None    IV/PRN: Ativan 1 mg IV x 2 doses, Dilaudid 1.5 mg IV x 3 doses, Haldol 2 mg IV x 1 dose.   Problem List: Principal Problem:   Acute on chronic respiratory failure with hypoxia and hypercapnia (HCC) Active Problems:   Primary cancer of right lower lobe of lung (HCC)   Cancer-related pain   Palliative care encounter   Depression   Chronic anticoagulation   NSTEMI (non-ST elevated myocardial infarction) ([place])   HCAP (healthcare-associated pneumonia)   Severe protein-calorie malnutrition ([place])   Pneumonia due to COVID-19 virus    Severe sepsis ([place])   Acute metabolic encephalopathy   Malnutrition of moderate degree   Acute on chronic hypoxic hypercapnic respiratory failure secondary to COVID-19 pneumonia.  Initially with EMS pulse ox was 70%.  The patient chronically wears 7 to 8 L of oxygen.  Unable to wean past this amount.  Still on 7 L as of 12/5.  Coarse breath sounds. Severe sepsis, present on admission with acute metabolic encephalopathy and COVID-19 pneumonia and possible bacterial pneumonia.  Has completed 5-day course of Solu-Medrol, remdesivir, Rocephin and doxycycline.  No further antibiotics.  Remain DNR and comfort measures status.  Ensure pain control Acute metabolic encephalopathy.  This is much improved.  Had elevated CO2 on presentation.  Baseline level of mentation Stage IV lung cancer.  Patient is a DO NOT RESUSCITATE and follows with hospice.  Ensure pain control while admitted.  Patient appears appropriate for an inpatient hospice discharge.  He is requiring frequent doses of IV narcotics.  Will need to remain in isolation until 12/6 Elevated troponin and suspected NSTEMI but also could be elevated from demand ischemia with acute respiratory distress.  No further work-up with his stage IV lung cancer.  No complaints of chest pain. Chronic pain.  Currently on oxycodone immediate release 20 mg every 4 hours as needed, Dilaudid 1.5 mg IV every 3 hours as needed, OxyContin 10 mg every 12 hours.  Current regimen effective for now. History of PE on Eliquis Severe protein calorie malnutrition  Discharge Planning: Ongoing. Plan to discharge home with hospice services, if stable or discharge to [facility], if eligible.  Family Contact: Phone call to friend Jimmy West.    IDT: Updated   Goals of Care: Clear, treat the treatable.    Please do not hesitate to call with any hospice related questions or concerns.    Thank you,    Bobbie "Loren Racer, [place], BSN [facility] Liaison [phone]

## 2021-07-21 NOTE — Progress Notes (Addendum)
Went in to do evening rounds for patient, patient was resting peacefully in bed. Called a nurse to patient room to verify if patient was breathing. Auscultated for 2 minutes, apical pulse and heard/felt nothing. Montine Circle, RN came into room and second verified for patient death. Notified Dr. Priscella Mann. Called Drue Dun (family) and notified her of patient decline/death. She stated she would be on her way to see patient. Charge nurse also aware. Pronounced at 1820.   Called [employer] and she verified that patient is not a donor. Reference number [id number], Katy Apo.

## 2021-07-21 NOTE — Progress Notes (Signed)
PROGRESS NOTE    Jimmy West  DTO:[id number] DOB: Aug 31, 1950 DOA: 05/24/2021 PCP: Pcp, No    Brief Narrative:  71 year old man with stage IV lung cancer followed by hospice, chronic pain and history of PE.  He presented to the hospital with acute on chronic hypoxic hypercapnic respiratory failure initially requiring BiPAP and currently on 10 L high flow nasal cannula.  Also severe sepsis with COVID-19 and possible bacterial pneumonia.  Patient had elevated troponin likely demand ischemia from acute respiratory distress but could also be NSTEMI.  Initially the patient was thinking about comfort care measures but since the patient is mentating he did want treatment for pneumonia and COVID-19.   Assessment & Plan:   Principal Problem:   Acute on chronic respiratory failure with hypoxia and hypercapnia (HCC) Active Problems:   Primary cancer of right lower lobe of lung (HCC)   Cancer-related pain   Palliative care encounter   Depression   Chronic anticoagulation   NSTEMI (non-ST elevated myocardial infarction) ([place])   HCAP (healthcare-associated pneumonia)   Severe protein-calorie malnutrition (HCC)   Pneumonia due to COVID-19 virus   Severe sepsis ([place])   Acute metabolic encephalopathy   Malnutrition of moderate degree  Acute on chronic hypoxic hypercapnic respiratory failure secondary to COVID-19 pneumonia.  Initially with EMS pulse ox was 70%.  The patient chronically wears 7 to 8 L of oxygen.  Unable to wean past this amount.  Still on 7 L as of 12/5.  Coarse breath sounds. Severe sepsis, present on admission with acute metabolic encephalopathy and COVID-19 pneumonia and possible bacterial pneumonia.  Has completed 5-day course of Solu-Medrol, remdesivir, Rocephin and doxycycline.  No further antibiotics.  Remain DNR and comfort measures status.  Ensure pain control Acute metabolic encephalopathy.  This is much improved.  Had elevated CO2 on presentation.  Baseline level of  mentation Stage IV lung cancer.  Patient is a DO NOT RESUSCITATE and follows with hospice.  Ensure pain control while admitted.  Patient appears appropriate for an inpatient hospice discharge.  He is requiring frequent doses of IV narcotics.  Will need to remain in isolation until 12/6 Elevated troponin and suspected NSTEMI but also could be elevated from demand ischemia with acute respiratory distress.  No further work-up with his stage IV lung cancer.  No complaints of chest pain. Chronic pain.  Currently on oxycodone immediate release 20 mg every 4 hours as needed, Dilaudid 1.5 mg IV every 3 hours as needed, OxyContin 10 mg every 12 hours.  Current regimen effective for now. History of PE on Eliquis Severe protein calorie malnutrition     DVT prophylaxis: Eliquis Code Status: DNR Family Communication: Samuel Germany [phone] on 12/3, at bedside on 12/5 Disposition Plan: Status is: Inpatient  Remains inpatient appropriate because: Patient active with hospice, still treating treatable disease.  Completed 5-day course of empiric antibiotics and remdesivir.  Comfort measures, focus on pain control.  Patient appears appropriate for inpatient hospice disposition.  Due to COVID isolation he will need to be reevaluated on 12/6 for appropriateness for hospice home.       Level of care: Med-Surg  Consultants:  None  Procedures:  None  Antimicrobials: Rocephin Azithromycin   Subjective: Patient seen and examined.  Resting in bed.  Reasonable pain control.  Does endorse anxiety.  Friend at bedside.    Objective: Vitals:   06/23/21 1700 06/23/21 2349 07-01-21 0639 2021-07-01 0840  BP: 112/68 (!) 91/58 109/69 104/70  Pulse: 92 100 93 (!)  103  Resp: 20 18 14 18   Temp: 98.7 F (37.1 C) 98.4 F (36.9 C) 97.6 F (36.4 C) 98 F (36.7 C)  TempSrc: Oral Oral Oral Oral  SpO2:  96% 100% 97%  Weight:      Height:        Intake/Output Summary (Last 24 hours) at 07/20/2021  1214 Last data filed at 06/23/2021 2000 Gross per 24 hour  Intake 60 ml  Output 600 ml  Net -540 ml   Filed Weights   05/28/2021 2023 06/17/21 0256  Weight: 60.1 kg 59.4 kg    Examination:  General exam: No apparent distress.  Frail and fatigued.  Chronically ill Respiratory system: Coarse breath sounds.  Scattered crackles.  Normal work of breathing.  7 L Cardiovascular system: S1-S2, RRR, no murmurs, no pedal edema Gastrointestinal system: Soft, NT/ND, normal bowel sounds Central nervous system: Alert and oriented. No focal neurological deficits. Extremities: Symmetric 5 x 5 power. Skin: No rashes, lesions or ulcers Psychiatry: Judgement and insight appear normal. Mood & affect appropriate.     Data Reviewed: I have personally reviewed following labs and imaging studies  CBC: Recent Labs  Lab 06/19/21 0517  WBC 9.5  HGB 8.9*  HCT 28.7*  MCV 98.3  PLT 161   Basic Metabolic Panel: Recent Labs  Lab 06/19/21 0517  NA 139  K 4.7  CL 100  CO2 35*  GLUCOSE 122*  BUN 38*  CREATININE 0.95  CALCIUM 8.5*   GFR: Estimated Creatinine Clearance: 59.9 mL/min (by C-G formula based on SCr of 0.95 mg/dL). Liver Function Tests: No results for input(s): AST, ALT, ALKPHOS, BILITOT, PROT, ALBUMIN in the last 168 hours.  No results for input(s): LIPASE, AMYLASE in the last 168 hours.  No results for input(s): AMMONIA in the last 168 hours. Coagulation Profile: No results for input(s): INR, PROTIME in the last 168 hours.  Cardiac Enzymes: No results for input(s): CKTOTAL, CKMB, CKMBINDEX, TROPONINI in the last 168 hours. BNP (last 3 results) No results for input(s): PROBNP in the last 8760 hours. HbA1C: No results for input(s): HGBA1C in the last 72 hours. CBG: No results for input(s): GLUCAP in the last 168 hours. Lipid Profile: No results for input(s): CHOL, HDL, LDLCALC, TRIG, CHOLHDL, LDLDIRECT in the last 72 hours. Thyroid Function Tests: No results for input(s):  TSH, T4TOTAL, FREET4, T3FREE, THYROIDAB in the last 72 hours. Anemia Panel: No results for input(s): VITAMINB12, FOLATE, FERRITIN, TIBC, IRON, RETICCTPCT in the last 72 hours. Sepsis Labs: No results for input(s): PROCALCITON, LATICACIDVEN in the last 168 hours.   Recent Results (from the past 240 hour(s))  Blood culture (routine x 2)     Status: None   Collection Time: 06/11/2021  9:22 PM   Specimen: BLOOD  Result Value Ref Range Status   Specimen Description BLOOD RIGHT ANTECUBITAL  Final   Special Requests   Final    BOTTLES DRAWN AEROBIC AND ANAEROBIC Blood Culture adequate volume   Culture   Final    NO GROWTH 5 DAYS Performed at [facility], [address], [place], [place] [address]    Report Status 06/19/2021 FINAL  Final  Resp Panel by RT-PCR (Flu A&B, Covid) Nasopharyngeal Swab     Status: Abnormal   Collection Time: 05/30/2021 11:07 PM   Specimen: Nasopharyngeal Swab; Nasopharyngeal(NP) swabs in vial transport medium  Result Value Ref Range Status   SARS Coronavirus 2 by RT PCR POSITIVE (A) NEGATIVE Final    Comment: RESULT CALLED  TO, READ BACK BY AND VERIFIED WITH: MELISSA HOUP 0112 06/15/21 LFD (NOTE) SARS-CoV-2 target nucleic acids are DETECTED.  The SARS-CoV-2 RNA is generally detectable in upper respiratory specimens during the acute phase of infection. Positive results are indicative of the presence of the identified virus, but do not rule out bacterial infection or co-infection with other pathogens not detected by the test. Clinical correlation with patient history and other diagnostic information is necessary to determine patient infection status. The expected result is Negative.  Fact Sheet for Patients: EntrepreneurPulse.com.au  Fact Sheet for Healthcare Providers: IncredibleEmployment.be  This test is not yet approved or cleared by the [place] FDA and  has been authorized for detection and/or diagnosis  of SARS-CoV-2 by FDA under an Emergency Use Authorization (EUA).  This EUA will remain in effect (meaning this test can be [place] ed) for the duration of  the COVID-19 declaration under Section 564(b)(1) of the Act, 21 U.S.C. section 360bbb-3(b)(1), unless the authorization is terminated or revoked sooner.     Influenza A by PCR NEGATIVE NEGATIVE Final   Influenza B by PCR NEGATIVE NEGATIVE Final    Comment: (NOTE) The Xpert Xpress SARS-CoV-2/FLU/RSV plus assay is intended as an aid in the diagnosis of influenza from Nasopharyngeal swab specimens and should not be used as a sole basis for treatment. Nasal washings and aspirates are unacceptable for Xpert Xpress SARS-CoV-2/FLU/RSV testing.  Fact Sheet for Patients: EntrepreneurPulse.com.au  Fact Sheet for Healthcare Providers: IncredibleEmployment.be  This test is not yet approved or cleared by the [place] FDA and has been authorized for detection and/or diagnosis of SARS-CoV-2 by FDA under an Emergency Use Authorization (EUA). This EUA will remain in effect (meaning this test can be used) for the duration of the COVID-19 declaration under Section 564(b)(1) of the Act, 21 U.S.C. section 360bbb-3(b)(1), unless the authorization is terminated or revoked.  Performed at [facility], [address] [place], [place] [address]   Blood culture (routine x 2)     Status: None   Collection Time: 06/15/21  6:23 AM   Specimen: BLOOD  Result Value Ref Range Status   Specimen Description BLOOD LFA  Final   Special Requests   Final    BOTTLES DRAWN AEROBIC AND ANAEROBIC Blood Culture adequate volume   Culture   Final    NO GROWTH 5 DAYS Performed at [facility], [address], [place], [place] [address]    Report Status 06/20/2021 FINAL  Final         Radiology Studies: No results found.      Scheduled Meds:  apixaban  5 mg Oral BID   Chlorhexidine Gluconate  Cloth  6 each Topical Daily   feeding supplement  237 mL Oral BID BM   FLUoxetine  40 mg Oral Daily   OLANZapine  10 mg Oral QHS   oxyCODONE  10 mg Oral Q12H   sodium chloride flush  10-40 mL Intracatheter Q12H   Continuous Infusions:     LOS: 10 days    Time spent: 15 minutes    Sidney Ace, MD [facility]   If 7PM-7AM, please contact night-coverage  2021/06/28, 12:14 PM

## 2021-07-21 NOTE — TOC Progression Note (Signed)
Transition of Care [facility]) - Progression Note    Patient Details  Name: Jimmy West MRN: [id number] Date of Birth: 03/29/1951  Transition of Care [facility] CM/SW [place], RN Phone Number: 07/04/2021, 11:59 AM  Clinical Narrative:  Patient remains on COVID isolation until tomorrow, at which time he will be evaluated for Hospice House/services.  [facility] aware.  TOC to follow to discharge.     Expected Discharge Plan: Home w Hospice Care Barriers to Discharge: Continued Medical Work up  Expected Discharge Plan and Services Expected Discharge Plan: [place]   Discharge Planning Services: CM Consult Post Acute Care Choice: Hospice Living arrangements for the past 2 months: Single Family Home                                       Social Determinants of Health (SDOH) Interventions    Readmission Risk Interventions Readmission Risk Prevention Plan 06/15/2021 04/26/2021  Transportation Screening Complete Complete  PCP or Specialist Appt within 3-5 Days - Complete  HRI or [place] - Complete  Social Work Consult for [place] Planning/Counseling - Complete  Palliative Care Screening - Not Applicable  Medication Review Press photographer) Referral to Pharmacy Complete  PCP or Specialist appointment within 3-5 days of discharge Complete -  [place] or Home Care Consult Complete -  SW Recovery Care/Counseling Consult Complete -  Palliative Care Screening Complete -  [place] Not Applicable -  Some recent data might be hidden

## 2021-07-21 NOTE — Death Summary Note (Signed)
DEATH SUMMARY   Patient Details  Name: Jimmy West MRN: [id number] DOB: Jun 14, 1951  Admission/Discharge Information   Admit Date:  06-16-2021  Date of Death: Date of Death: 26-Jun-2021  Time of Death: Time of Death: 09-30-18  Length of Stay: Sep 30, 2022  Referring Physician: Pcp, No   Reason(s) for Hospitalization  71 year old man with stage IV lung cancer followed by hospice, chronic pain and history of PE.  He presented to the hospital with acute on chronic hypoxic hypercapnic respiratory failure initially requiring BiPAP and currently on 10 L high flow nasal cannula.  Also severe sepsis with COVID-19 and possible bacterial pneumonia.  Patient had elevated troponin likely demand ischemia from acute respiratory distress but could also be NSTEMI.  Initially the patient was thinking about comfort care measures but since the patient is mentating he did want treatment for pneumonia and COVID-19.  Patient was seen in consultation by oncology and palliative care and given his advanced lung cancer, not a candidate for treatment and severe sepsis secondary to infection as well as hypoxic respiratory failure decision was made to transition to full comfort measures.  At this time all medications were focused on patient comfort.  Aggressively uptitrated pain and anxiety regimen to achieve comfort.  Patient was comfortable on my daily evaluations.  On the day of patient's demise I did have a long conversation with him as well as his good friend at bedside.  Explained the clinical situation and he is overall very poor prognosis.  They expressed understanding.  At approximately 1830 on 06/27/2023 I received a call from bedside RN stating that the patient had passed.  Death was confirmed by 2 RNs.  Absence of breath sounds and heart sounds on auscultation.  Patient's friend was notified of patient's demise.  Diagnoses  Preliminary cause of death:  Secondary Diagnoses (including complications and co-morbidities):   Principal Problem:   Acute on chronic respiratory failure with hypoxia and hypercapnia (HCC) Active Problems:   Primary cancer of right lower lobe of lung (HCC)   Cancer-related pain   Palliative care encounter   Depression   Chronic anticoagulation   NSTEMI (non-ST elevated myocardial infarction) ([place])   HCAP (healthcare-associated pneumonia)   Severe protein-calorie malnutrition (HCC)   Pneumonia due to COVID-19 virus   Severe sepsis ([place])   Acute metabolic encephalopathy   Malnutrition of moderate degree   Brief Hospital Course (including significant findings, care, treatment, and services provided and events leading to death)  71 year old man with stage IV lung cancer followed by hospice, chronic pain and history of PE.  He presented to the hospital with acute on chronic hypoxic hypercapnic respiratory failure initially requiring BiPAP and currently on 10 L high flow nasal cannula.  Also severe sepsis with COVID-19 and possible bacterial pneumonia.  Patient had elevated troponin likely demand ischemia from acute respiratory distress but could also be NSTEMI.  Initially the patient was thinking about comfort care measures but since the patient is mentating he did want treatment for pneumonia and COVID-19.  Patient was seen in consultation by oncology and palliative care and given his advanced lung cancer, not a candidate for treatment and severe sepsis secondary to infection as well as hypoxic respiratory failure decision was made to transition to full comfort measures.  At this time all medications were focused on patient comfort.  Aggressively uptitrated pain and anxiety regimen to achieve comfort.  Patient was comfortable on my daily evaluations.  On the day of patient's demise I did have a  long conversation with him as well as his good friend at bedside.  Explained the clinical situation and he is overall very poor prognosis.  They expressed understanding.  At approximately 1830 on  12/5 I received a call from bedside RN stating that the patient had passed.  Death was confirmed by 2 RNs.  Absence of breath sounds and heart sounds on auscultation.  Patient's friend was notified of patient's demise.    Pertinent Labs and Studies  Significant Diagnostic Studies DG Chest 2 View  Result Date: 05/28/2021 CLINICAL DATA:  Difficulty breathing EXAM: CHEST - 2 VIEW COMPARISON:  Previous studies including the chest radiograph done on 05/24/2021 FINDINGS: Transverse diameter of heart is increased. Increased markings are seen in parahilar regions and lower lung fields, more so on the right side. There is decreased volume in both lungs. Lateral CP angles are clear. There is no pneumothorax. Tip central venous catheter is seen in right atrium. IMPRESSION: Increased interstitial and alveolar markings in both lungs suggest pulmonary fibrosis and possibly superimposed pneumonitis. No significant interval changes are noted since 05/24/2021. Electronically Signed   By: Elmer Picker M.D.   On: 05/28/2021 08:27   CT CHEST WO CONTRAST  Result Date: 05/29/2021 CLINICAL DATA:  71 year old male with history of acute interstitial pneumonitis. Shortness of breath. History of lung cancer. EXAM: CT CHEST WITHOUT CONTRAST TECHNIQUE: Multidetector CT imaging of the chest was performed following the standard protocol without IV contrast. COMPARISON:  Chest CT 05/15/2021. FINDINGS: Cardiovascular: Heart size is enlarged with left ventricular dilatation, most notably in the apex where there appears to be mild aneurysmal dilatation, as well as areas of dystrophic calcification, presumably from prior distal LAD territory myocardial infarction(s). There is no significant pericardial fluid, thickening or pericardial calcification. There is aortic atherosclerosis, as well as atherosclerosis of the great vessels of the mediastinum and the coronary arteries, including calcified atherosclerotic plaque in the left  main, left anterior descending and right coronary arteries. Right internal jugular single-lumen porta cath with tip terminating in the right atrium. Mediastinum/Nodes: No pathologically enlarged mediastinal or hilar lymph nodes. Please note that accurate exclusion of hilar adenopathy is limited on noncontrast CT scans. Esophagus is unremarkable in appearance. No axillary lymphadenopathy. Lungs/Pleura: Widespread but patchy areas of airspace consolidation, ground-glass attenuation, septal thickening, thickening of the peribronchovascular interstitium, cylindrical and varicose bronchiectasis, and regional areas of architectural distortion are again noted throughout the lungs bilaterally. Overall, aeration appears slightly worsened, particularly in the lower lobes of the lungs bilaterally. Treated right lower lobe neoplasm is obscured by the evolving parenchymal process in the lungs. There is also diffuse bronchial wall thickening with moderate centrilobular and paraseptal emphysema. Trace right pleural effusion, unchanged. No definite left pleural effusion Upper Abdomen: Aortic atherosclerosis. Exophytic low-attenuation lesion in the upper pole the right kidney measuring 2.1 cm in diameter, incompletely characterized on today's non-contrast CT examination, but similar to the prior study and statistically likely to represent a cyst. Musculoskeletal: Chronic compression fracture of T4 with vertebral plana appearance, similar to the prior examination. There are no aggressive appearing lytic or blastic lesions noted in the visualized portions of the skeleton. IMPRESSION: . 1. Overall, there is been slight worsened aeration in the lower lungs when compared to the recent prior examination. Findings could be consistent with reported clinical history of acute interstitial pneumonitis. 2. Aortic atherosclerosis, in addition to left main and 2 vessel coronary artery disease. Assessment for potential risk factor modification,  dietary therapy or pharmacologic therapy may be warranted,  if clinically indicated. 3. Diffuse bronchial wall thickening with moderate centrilobular and paraseptal emphysema; imaging findings suggestive of underlying COPD. Aortic Atherosclerosis (ICD10-I70.0) and Emphysema (ICD10-J43.9). Electronically Signed   By: Vinnie Langton M.D.   On: 05/29/2021 11:10   DG Chest Portable 1 View  Result Date: 05/24/2021 CLINICAL DATA:  Shortness of breath. EXAM: PORTABLE CHEST 1 VIEW COMPARISON:  Chest radiograph dated 05/28/2021 and CT dated 05/29/2021. FINDINGS: Right-sided Port-A-Cath in similar position. Bilateral interstitial coarsening and bronchitic changes and nodularity. Large areas of airspace opacities involving the mid to lower lung field have progressed since the prior radiograph and may represent interval development of pneumonia on background of pulmonary fibrosis. There is emphysematous changes of the lungs. Stable cardiomediastinal silhouette. No acute osseous pathology. IMPRESSION: Progression of bilateral mid to lower lung field airspace opacities compared to the prior radiograph. Electronically Signed   By: Anner Crete M.D.   On: 06/13/2021 20:57    Microbiology No results found for this or any previous visit (from the past 240 hour(s)).  Lab Basic Metabolic Panel: Recent Labs  Lab 06/19/21 0517  NA 139  K 4.7  CL 100  CO2 35*  GLUCOSE 122*  BUN 38*  CREATININE 0.95  CALCIUM 8.5*   Liver Function Tests: No results for input(s): AST, ALT, ALKPHOS, BILITOT, PROT, ALBUMIN in the last 168 hours. No results for input(s): LIPASE, AMYLASE in the last 168 hours. No results for input(s): AMMONIA in the last 168 hours. CBC: Recent Labs  Lab 06/19/21 0517  WBC 9.5  HGB 8.9*  HCT 28.7*  MCV 98.3  PLT 171   Cardiac Enzymes: No results for input(s): CKTOTAL, CKMB, CKMBINDEX, TROPONINI in the last 168 hours. Sepsis Labs: Recent Labs  Lab 06/19/21 0517  WBC 9.5     Procedures/Operations  None   Ilhan Debenedetto B Tyrell Seifer 06/25/2021, 2:28 PM

## 2021-07-21 DEATH — deceased

## 2021-07-30 LAB — MISC LABCORP TEST (SEND OUT): Labcorp test code: 3001866

## 2023-05-26 NOTE — Telephone Encounter (Signed)
Signing encounter to close chart
